# Patient Record
Sex: Male | Born: 1970 | Race: White | Hispanic: No | Marital: Single | Smoking: Never smoker
Health system: Southern US, Community
[De-identification: ages and names within clinical notes are randomized; demographics above are authoritative.]

## PROBLEM LIST (undated history)

## (undated) DIAGNOSIS — M1990 Unspecified osteoarthritis, unspecified site: Secondary | ICD-10-CM

## (undated) DIAGNOSIS — K920 Hematemesis: Secondary | ICD-10-CM

## (undated) DIAGNOSIS — N4821 Abscess of corpus cavernosum and penis: Secondary | ICD-10-CM

## (undated) DIAGNOSIS — E109 Type 1 diabetes mellitus without complications: Secondary | ICD-10-CM

## (undated) DIAGNOSIS — E039 Hypothyroidism, unspecified: Secondary | ICD-10-CM

## (undated) DIAGNOSIS — E875 Hyperkalemia: Secondary | ICD-10-CM

## (undated) DIAGNOSIS — Z992 Dependence on renal dialysis: Secondary | ICD-10-CM

## (undated) DIAGNOSIS — L723 Sebaceous cyst: Secondary | ICD-10-CM

## (undated) DIAGNOSIS — H269 Unspecified cataract: Secondary | ICD-10-CM

## (undated) DIAGNOSIS — E1140 Type 2 diabetes mellitus with diabetic neuropathy, unspecified: Secondary | ICD-10-CM

## (undated) DIAGNOSIS — I2510 Atherosclerotic heart disease of native coronary artery without angina pectoris: Secondary | ICD-10-CM

## (undated) DIAGNOSIS — I38 Endocarditis, valve unspecified: Secondary | ICD-10-CM

## (undated) DIAGNOSIS — F419 Anxiety disorder, unspecified: Secondary | ICD-10-CM

## (undated) DIAGNOSIS — D649 Anemia, unspecified: Secondary | ICD-10-CM

## (undated) DIAGNOSIS — I5030 Unspecified diastolic (congestive) heart failure: Secondary | ICD-10-CM

## (undated) DIAGNOSIS — J189 Pneumonia, unspecified organism: Secondary | ICD-10-CM

## (undated) DIAGNOSIS — I272 Pulmonary hypertension, unspecified: Secondary | ICD-10-CM

## (undated) DIAGNOSIS — R410 Disorientation, unspecified: Secondary | ICD-10-CM

## (undated) DIAGNOSIS — N186 End stage renal disease: Secondary | ICD-10-CM

## (undated) DIAGNOSIS — F329 Major depressive disorder, single episode, unspecified: Secondary | ICD-10-CM

## (undated) DIAGNOSIS — A419 Sepsis, unspecified organism: Secondary | ICD-10-CM

## (undated) DIAGNOSIS — I10 Essential (primary) hypertension: Secondary | ICD-10-CM

## (undated) DIAGNOSIS — I2729 Other secondary pulmonary hypertension: Secondary | ICD-10-CM

## (undated) DIAGNOSIS — I219 Acute myocardial infarction, unspecified: Secondary | ICD-10-CM

## (undated) DIAGNOSIS — M869 Osteomyelitis, unspecified: Secondary | ICD-10-CM

## (undated) DIAGNOSIS — I739 Peripheral vascular disease, unspecified: Secondary | ICD-10-CM

## (undated) DIAGNOSIS — J9610 Chronic respiratory failure, unspecified whether with hypoxia or hypercapnia: Secondary | ICD-10-CM

## (undated) DIAGNOSIS — F32A Depression, unspecified: Secondary | ICD-10-CM

## (undated) DIAGNOSIS — E785 Hyperlipidemia, unspecified: Secondary | ICD-10-CM

## (undated) DIAGNOSIS — K219 Gastro-esophageal reflux disease without esophagitis: Secondary | ICD-10-CM

## (undated) DIAGNOSIS — N360 Urethral fistula: Secondary | ICD-10-CM

## (undated) DIAGNOSIS — I252 Old myocardial infarction: Secondary | ICD-10-CM

## (undated) DIAGNOSIS — N492 Inflammatory disorders of scrotum: Secondary | ICD-10-CM

## (undated) DIAGNOSIS — Z8719 Personal history of other diseases of the digestive system: Secondary | ICD-10-CM

## (undated) HISTORY — DX: Other secondary pulmonary hypertension: I27.29

## (undated) HISTORY — PX: DIALYSIS FISTULA CREATION: SHX611

## (undated) HISTORY — DX: Abscess of corpus cavernosum and penis: N48.21

## (undated) HISTORY — DX: Sepsis, unspecified organism: A41.9

## (undated) HISTORY — DX: Old myocardial infarction: I25.2

## (undated) HISTORY — DX: Chronic respiratory failure, unspecified whether with hypoxia or hypercapnia: J96.10

## (undated) HISTORY — PX: CARDIAC CATHETERIZATION: SHX172

## (undated) HISTORY — PX: SKIN GRAFT: SHX250

## (undated) HISTORY — DX: Inflammatory disorders of scrotum: N49.2

## (undated) HISTORY — DX: Disorientation, unspecified: R41.0

## (undated) HISTORY — DX: Hematemesis: K92.0

## (undated) HISTORY — DX: Endocarditis, valve unspecified: I38

## (undated) HISTORY — DX: End stage renal disease: N18.6

## (undated) HISTORY — DX: Sebaceous cyst: L72.3

## (undated) HISTORY — DX: Dependence on renal dialysis: Z99.2

## (undated) HISTORY — DX: Pulmonary hypertension, unspecified: I27.20

## (undated) HISTORY — DX: Urethral fistula: N36.0

## (undated) HISTORY — PX: AMPUTATION: SHX166

## (undated) HISTORY — DX: Type 1 diabetes mellitus without complications: E10.9

## (undated) HISTORY — DX: Essential (primary) hypertension: I10

## (undated) HISTORY — DX: Atherosclerotic heart disease of native coronary artery without angina pectoris: I25.10

## (undated) HISTORY — DX: Unspecified diastolic (congestive) heart failure: I50.30

## (undated) HISTORY — PX: FOOT AMPUTATION: SHX951

## (undated) HISTORY — DX: Type 2 diabetes mellitus with diabetic neuropathy, unspecified: E11.40

## (undated) HISTORY — DX: Unspecified cataract: H26.9

## (undated) HISTORY — PX: ABSCESS DRAINAGE: SHX1119

---

## 1997-11-09 HISTORY — PX: EYE SURGERY: SHX253

## 2005-12-24 ENCOUNTER — Inpatient Hospital Stay: Payer: Self-pay | Admitting: Internal Medicine

## 2007-03-13 ENCOUNTER — Inpatient Hospital Stay: Payer: Self-pay | Admitting: Internal Medicine

## 2007-03-15 ENCOUNTER — Other Ambulatory Visit: Payer: Self-pay

## 2007-03-23 ENCOUNTER — Ambulatory Visit: Payer: Self-pay | Admitting: Specialist

## 2007-03-28 ENCOUNTER — Ambulatory Visit: Payer: Self-pay | Admitting: Internal Medicine

## 2007-04-03 ENCOUNTER — Ambulatory Visit: Payer: Self-pay | Admitting: Internal Medicine

## 2007-04-10 ENCOUNTER — Ambulatory Visit: Payer: Self-pay | Admitting: Internal Medicine

## 2007-04-19 ENCOUNTER — Ambulatory Visit: Payer: Self-pay | Admitting: Internal Medicine

## 2008-11-09 DIAGNOSIS — I219 Acute myocardial infarction, unspecified: Secondary | ICD-10-CM

## 2008-11-09 HISTORY — DX: Acute myocardial infarction, unspecified: I21.9

## 2008-11-25 ENCOUNTER — Inpatient Hospital Stay: Payer: Self-pay | Admitting: Internal Medicine

## 2008-12-01 ENCOUNTER — Inpatient Hospital Stay: Payer: Self-pay | Admitting: Specialist

## 2008-12-25 ENCOUNTER — Ambulatory Visit: Payer: Self-pay | Admitting: Family Medicine

## 2008-12-27 ENCOUNTER — Inpatient Hospital Stay: Payer: Self-pay | Admitting: Internal Medicine

## 2009-02-20 ENCOUNTER — Ambulatory Visit: Payer: Self-pay | Admitting: Orthopedic Surgery

## 2009-02-28 ENCOUNTER — Ambulatory Visit: Payer: Self-pay | Admitting: Vascular Surgery

## 2009-03-05 ENCOUNTER — Ambulatory Visit: Payer: Self-pay | Admitting: Vascular Surgery

## 2009-05-03 ENCOUNTER — Ambulatory Visit: Payer: Self-pay | Admitting: Vascular Surgery

## 2009-05-17 ENCOUNTER — Ambulatory Visit: Payer: Self-pay | Admitting: Vascular Surgery

## 2009-05-23 ENCOUNTER — Inpatient Hospital Stay: Payer: Self-pay | Admitting: Internal Medicine

## 2009-06-26 ENCOUNTER — Emergency Department: Payer: Self-pay | Admitting: Unknown Physician Specialty

## 2009-10-08 ENCOUNTER — Ambulatory Visit: Payer: Self-pay | Admitting: Vascular Surgery

## 2009-10-09 HISTORY — PX: CORONARY ARTERY BYPASS GRAFT: SHX141

## 2009-10-10 ENCOUNTER — Inpatient Hospital Stay: Payer: Self-pay | Admitting: Internal Medicine

## 2009-11-05 ENCOUNTER — Emergency Department: Payer: Self-pay | Admitting: Emergency Medicine

## 2010-07-10 ENCOUNTER — Ambulatory Visit: Payer: Self-pay | Admitting: Vascular Surgery

## 2010-08-25 ENCOUNTER — Inpatient Hospital Stay: Payer: Self-pay | Admitting: Internal Medicine

## 2010-08-25 ENCOUNTER — Ambulatory Visit: Payer: Self-pay | Admitting: Internal Medicine

## 2010-08-29 ENCOUNTER — Ambulatory Visit: Payer: Self-pay | Admitting: Vascular Surgery

## 2010-09-02 ENCOUNTER — Other Ambulatory Visit: Payer: Self-pay | Admitting: Podiatry

## 2010-09-05 ENCOUNTER — Ambulatory Visit: Payer: Self-pay | Admitting: Podiatry

## 2010-09-15 ENCOUNTER — Other Ambulatory Visit: Payer: Self-pay | Admitting: Podiatry

## 2010-09-24 ENCOUNTER — Ambulatory Visit: Payer: Self-pay | Admitting: Podiatry

## 2010-09-26 ENCOUNTER — Ambulatory Visit: Payer: Self-pay | Admitting: Podiatry

## 2010-09-29 LAB — PATHOLOGY REPORT

## 2010-10-07 ENCOUNTER — Ambulatory Visit: Payer: Self-pay | Admitting: Cardiovascular Disease

## 2010-11-26 ENCOUNTER — Ambulatory Visit: Payer: Self-pay | Admitting: Cardiovascular Disease

## 2011-01-26 ENCOUNTER — Telehealth: Payer: Self-pay | Admitting: Family Medicine

## 2011-01-26 ENCOUNTER — Encounter: Payer: Self-pay | Admitting: Family Medicine

## 2011-01-26 ENCOUNTER — Ambulatory Visit (INDEPENDENT_AMBULATORY_CARE_PROVIDER_SITE_OTHER): Payer: Medicare Other | Admitting: Family Medicine

## 2011-01-26 ENCOUNTER — Other Ambulatory Visit: Payer: Self-pay | Admitting: Family Medicine

## 2011-01-26 DIAGNOSIS — E785 Hyperlipidemia, unspecified: Secondary | ICD-10-CM

## 2011-01-26 DIAGNOSIS — N186 End stage renal disease: Secondary | ICD-10-CM

## 2011-01-26 DIAGNOSIS — E109 Type 1 diabetes mellitus without complications: Secondary | ICD-10-CM

## 2011-01-26 DIAGNOSIS — E119 Type 2 diabetes mellitus without complications: Secondary | ICD-10-CM

## 2011-01-26 DIAGNOSIS — I252 Old myocardial infarction: Secondary | ICD-10-CM | POA: Insufficient documentation

## 2011-01-26 DIAGNOSIS — E039 Hypothyroidism, unspecified: Secondary | ICD-10-CM | POA: Insufficient documentation

## 2011-01-26 HISTORY — DX: Old myocardial infarction: I25.2

## 2011-01-26 LAB — BASIC METABOLIC PANEL
CO2: 31 mEq/L (ref 19–32)
Chloride: 95 mEq/L — ABNORMAL LOW (ref 96–112)
Creatinine, Ser: 4.6 mg/dL (ref 0.4–1.5)
Sodium: 136 mEq/L (ref 135–145)

## 2011-01-26 LAB — LIPID PANEL
Total CHOL/HDL Ratio: 3
Triglycerides: 84 mg/dL (ref 0.0–149.0)

## 2011-01-26 LAB — HEMOGLOBIN A1C: Hgb A1c MFr Bld: 13.3 % — ABNORMAL HIGH (ref 4.6–6.5)

## 2011-01-27 ENCOUNTER — Other Ambulatory Visit: Payer: Self-pay | Admitting: Family Medicine

## 2011-01-27 DIAGNOSIS — E1122 Type 2 diabetes mellitus with diabetic chronic kidney disease: Secondary | ICD-10-CM

## 2011-02-05 NOTE — Progress Notes (Signed)
  Phone Note From Other Clinic   Caller: [facility] Call For: Dr [name] Summary of Call: Critical CRT, 4.6 Initial call taken by: Mills Koller,  January 26, 2011 4:45 PM

## 2011-02-05 NOTE — Assessment & Plan Note (Signed)
Summary: new pt to est CLE   Vital Signs:  Patient profile:   40 year old male Height:      75 inches Weight:      208.50 pounds BMI:     26.15 Temp:     98.3 degrees F oral Pulse rate:   72 / minute Pulse rhythm:   regular BP sitting:   140 / 90  (right arm) Cuff size:   regular  Vitals Entered By: Linde Gillis CMA Duncan Dull) (January 26, 2011 1:57 PM) CC: new patient, establish care   History of Present Illness: 40 yo here to establish care.  ESRD- secondary to DM, HTN.  On dialysis for past two years, LUE fistula.  [facility]. Was on transplant list, per pt, taken off because he had an MI requiring triple bypass in 10/2009.  h/o MI- s/p triple CABG, 10/2009 (Dr. Katrinka Blazing, [facility]).  Per patient, had a cath in October that was unremarkable.  Denies any CP currently.  Type I DM- very brittle.  Only takes Lantus 10 units two times a day.  a1c in past was 14.  Pt does not have a primary doctors, most meds managed through nephrology or during hospital admissions.  Reports passing out when he is on higher doses of lantus.  Does not have an endocrinologist.  diabetic peripheral neuropathy- severe, s/p multiple toe amputations.  Have very little feeling in his fingers or lower extremities.  HLD- on crestor 20 mg daily.  unsure when he had a last lipid panel.  Hypothyroidism- on synthroid 25 micrograms daily.  Denies any symptoms of hypo or hyperthyroidism.   Preventive Screening-Counseling & Management  Alcohol-Tobacco     Smoking Status: never      Drug Use:  no.    Current Medications (verified): 1)  Sensipar 60 Mg Tabs (Cinacalcet Hcl) .... Take One Tablet With Biggest Meal of The Day 2)  Lyrica 150 Mg Caps (Pregabalin) .... Take One Tablet Two Times A Day 3)  Omeprazole 40 Mg Cpdr (Omeprazole) .... Take One Tablet By Mouth Daily and As Needed 4)  Carvedilol 25 Mg Tabs (Carvedilol) .... Take One Tablet By Mouth Two Times A Day 5)  Furosemide 80 Mg Tabs (Furosemide) .... Take  One Tablet By Mouth Two Times A Day 6)  Crestor 20 Mg Tabs (Rosuvastatin Calcium) .... Take One Tablet By Mouth At Bedtime 7)  Levothroid 25 Mcg Tabs (Levothyroxine Sodium) .... Take One Tablet By Mouth Daily 8)  Isosorbide Mononitrate Cr 30 Mg Xr24h-Tab (Isosorbide Mononitrate) .... Take One Tablet By Mouth Daily 9)  Fish Oil 1000 Mg Caps (Omega-3 Fatty Acids) .... Take One Tablet By Mouth Daily 10)  Aspirin 325 Mg Tabs (Aspirin) .... Take One Tablet By Mouth Daily 11)  Lantus 100 Unit/ml Soln (Insulin Glargine) .... Inject 10 Units Two Times A Day  Allergies (verified): No Known Drug Allergies  Past History:  Family History: Last updated: 01/26/2011 Family History of CAD Male 1st degree relative <60 Family History Diabetes 1st degree relative Family History Hypertension  Social History: Last updated: 01/26/2011 Never Smoked Alcohol use-yes Drug use-no  Risk Factors: Smoking Status: never (01/26/2011)  Past Medical History: Diabetes mellitus, type I End Stage Renal Disease- on dialysis- LUE fistula Hypothyroidism Myocardial infarction, hx of  Past Surgical History: left upper air fistula CABG 10/2009 toe amputations  Family History: Family History of CAD Male 1st degree relative <60 Family History Diabetes 1st degree relative Family History Hypertension  Social History: Never Smoked Alcohol  use-yes Drug use-no Smoking Status:  never Drug Use:  no  Review of Systems      See HPI General:  Denies malaise. Eyes:  Denies blurring. ENT:  Denies difficulty swallowing. CV:  Denies chest pain or discomfort. Resp:  Denies shortness of breath. GI:  Denies abdominal pain. GU:  Denies dysuria. MS:  Complains of muscle weakness. Derm:  Denies rash. Neuro:  Denies headaches. Psych:  Denies anxiety and depression. Endo:  Denies cold intolerance and heat intolerance. Heme:  Denies abnormal bruising and bleeding.  Physical Exam  General:  alert.  NAD Head:   normocephalic and atraumatic.   Eyes:  vision grossly intact, pupils equal, pupils round, and pupils reactive to light.   Ears:  R ear normal and L ear normal.   Nose:  External nasal examination shows no deformity or inflammation. Nasal mucosa are pink and moist without lesions or exudates. Mouth:  teeth missing.   Neck:  No deformities, masses, or tenderness noted. Lungs:  Normal respiratory effort, chest expands symmetrically. Lungs are clear to auscultation, no crackles or wheezes. Heart:  Normal rate and regular rhythm. S1 and S2 normal without gallop, murmur, click, rub or other extra sounds. Abdomen:  Bowel sounds positive,abdomen soft and non-tender without masses, organomegaly or hernias noted. Msk:  LUE fistula Neurologic:  alert & oriented X3 and gait normal.   Skin:  Intact without suspicious lesions or rashes Psych:  Cognition and judgment appear intact. Alert and cooperative with normal attention span and concentration. No apparent delusions, illusions, hallucinations   Impression & Recommendations:  Problem # 1:  END STAGE RENAL DISEASE (ICD-585.6) Assessment Unchanged awaiting records, check BMET today. Orders: TLB-BMP (Basic Metabolic Panel-BMET) (80048-METABOL)  Problem # 2:  HYPERLIPIDEMIA (ICD-272.4) Assessment: Unchanged recheck fasting lipid panel today. His updated medication list for this problem includes:    Crestor 20 Mg Tabs (Rosuvastatin calcium) .[name]... Take one tablet by mouth at bedtime  Orders: Venipuncture (16109) TLB-Lipid Panel (80061-LIPID)  Problem # 3:  DIABETES MELLITUS, TYPE I (ICD-250.01) Assessment: Deteriorated poorly controlled.  check a1c but likely will require endocrinology referral.  Needs to get this under control to prepare him for kidney transplant. UTD pneumovax. His updated medication list for this problem includes:    Aspirin 325 Mg Tabs (Aspirin) .[name]... Take one tablet by mouth daily    Lantus 100 Unit/ml Soln (Insulin  glargine) ..... Inject 10 units two times a day  Problem # 4:  MYOCARDIAL INFARCTION, HX OF (ICD-412) Assessment: Unchanged refer to [facility] cardiology. His updated medication list for this problem includes:    Carvedilol 25 Mg Tabs (Carvedilol) .[name]... Take one tablet by mouth two times a day    Furosemide 80 Mg Tabs (Furosemide) .[name]... Take one tablet by mouth two times a day    Isosorbide Mononitrate Cr 30 Mg Xr24h-tab (Isosorbide mononitrate) .[name]... Take one tablet by mouth daily    Aspirin 325 Mg Tabs (Aspirin) .[name]... Take one tablet by mouth daily  Orders: Cardiology Referral (Cardiology)  Problem # 5:  HYPOTHYROIDISM (ICD-244.9) Assessment: Unchanged recheck TSH today. His updated medication list for this problem includes:    Levothroid 25 Mcg Tabs (Levothyroxine sodium) .[name]... Take one tablet by mouth daily  Orders: TLB-TSH (Thyroid Stimulating Hormone) (84443-TSH)  Complete Medication List: 1)  Sensipar 60 Mg Tabs (Cinacalcet hcl) .... Take one tablet with biggest meal of the day 2)  Lyrica 150 Mg Caps (Pregabalin) .... Take one tablet two times a day 3)  Omeprazole 40  Mg Cpdr (Omeprazole) .... Take one tablet by mouth daily and as needed 4)  Carvedilol 25 Mg Tabs (Carvedilol) .... Take one tablet by mouth two times a day 5)  Furosemide 80 Mg Tabs (Furosemide) .... Take one tablet by mouth two times a day 6)  Crestor 20 Mg Tabs (Rosuvastatin calcium) .... Take one tablet by mouth at bedtime 7)  Levothroid 25 Mcg Tabs (Levothyroxine sodium) .... Take one tablet by mouth daily 8)  Isosorbide Mononitrate Cr 30 Mg Xr24h-tab (Isosorbide mononitrate) .... Take one tablet by mouth daily 9)  Fish Oil 1000 Mg Caps (Omega-3 fatty acids) .... Take one tablet by mouth daily 10)  Aspirin 325 Mg Tabs (Aspirin) .... Take one tablet by mouth daily 11)  Lantus 100 Unit/ml Soln (Insulin glargine) .... Inject 10 units two times a day  Other Orders: TLB-A1C / Hgb A1C (Glycohemoglobin)  (83036-A1C)  Patient Instructions: 1)  Please stop by to see Shirlee Limerick on your way out.   Orders Added: 1)  Cardiology Referral [Cardiology] 2)  Venipuncture [52841] 3)  TLB-Lipid Panel [80061-LIPID] 4)  TLB-BMP (Basic Metabolic Panel-BMET) [80048-METABOL] 5)  TLB-TSH (Thyroid Stimulating Hormone) [84443-TSH] 6)  TLB-A1C / Hgb A1C (Glycohemoglobin) [83036-A1C] 7)  New Patient Level IV [32440]    Current Allergies (reviewed today): No known allergies   TD Result Date:  01/22/2009 TD Result:  historical

## 2011-02-06 ENCOUNTER — Other Ambulatory Visit: Payer: Self-pay | Admitting: Family Medicine

## 2011-02-10 ENCOUNTER — Other Ambulatory Visit: Payer: Self-pay | Admitting: *Deleted

## 2011-02-11 ENCOUNTER — Encounter: Payer: Self-pay | Admitting: Cardiovascular Disease

## 2011-02-11 ENCOUNTER — Ambulatory Visit (INDEPENDENT_AMBULATORY_CARE_PROVIDER_SITE_OTHER): Payer: Medicare Other | Admitting: Cardiovascular Disease

## 2011-02-11 DIAGNOSIS — I10 Essential (primary) hypertension: Secondary | ICD-10-CM

## 2011-02-11 DIAGNOSIS — Z951 Presence of aortocoronary bypass graft: Secondary | ICD-10-CM

## 2011-02-11 DIAGNOSIS — E109 Type 1 diabetes mellitus without complications: Secondary | ICD-10-CM

## 2011-02-11 DIAGNOSIS — N186 End stage renal disease: Secondary | ICD-10-CM

## 2011-02-11 DIAGNOSIS — E785 Hyperlipidemia, unspecified: Secondary | ICD-10-CM

## 2011-02-11 DIAGNOSIS — I252 Old myocardial infarction: Secondary | ICD-10-CM

## 2011-02-11 HISTORY — DX: Essential (primary) hypertension: I10

## 2011-02-11 MED ORDER — ISOSORBIDE MONONITRATE ER 30 MG PO TB24: 30.0000 mg | ORAL_TABLET | Freq: Two times a day (BID) | ORAL | Status: DC

## 2011-02-11 NOTE — Assessment & Plan Note (Signed)
Isosorbide was increased from 30 mg daily to b.i.d. For hypertension. He can take this on nondialysis days, with one tab on dialysis days. I've asked him to monitor his blood pressure at home and call us with his numbers for further medication titration.

## 2011-02-11 NOTE — Progress Notes (Signed)
Patient ID: Taylor [name], male    DOB: 01-10-1971, 40 y.o.   MRN: [id number]  HPI Comments: Taylor [name] is a pleasant Gentleman with history of coronary artery disease, bypass surgery in December 2010 at [facility], poorly controlled diabetes, hypertension, end-stage renal disease on dialysis on Tuesday, Thursday and Saturday who presents to establish care.  Overall he reports that he is doing well. He does have occasional shortness of breath and chest pain when he overexerts himself. He did have a cardiac catheterization at the beginning of the year that showed patent grafts. He does report an episode of hypotension and several of his medications were held though he is uncertain which blood pressure medications these were. He has had hypotension on dialysis days and recently Dr. Thedore Mins has increased his dry weight.  EKG today shows normal sinus rhythm with rate 89 beats per minute with no significant ST or T wave changes.  ECHO 08/2010 Ejection Fraction = >55%. The left ventricular wall motion is normal. The right ventricle is normal in size and function. There is mild tricuspid regurgitation.   Cardiac cath 11/2010: Proximal LAD: There was a 80 % stenosis. Distal LAD: There was a 100% stenosis. 1st diagonal: There was a 70 % stenosis. Proximal circumflex: There was a 70 % stenosis. 1st obtuse marginal: There was a 100 % stenosis. Proximal RCA: There was a 70 % stenosis. Mid RCA: There was a 100 % stenosis. Right PDA: There was a 70 % stenosis.  Graft to the distal LAD: The graft was a LIMA. Graft angiography showed no evidence of disease. Graft to the 1st obtuse marginal: The graft was a saphenous vein graft from the aorta. Graft angiography showed no evidence of disease. Graft to the LPDA: The graft was a saphenous vein graft from the aorta. Graft angiography showed no evidence of disease. Graft to the distal RCA: The graft was a saphenous vein graft from the aorta. Graft angiography showed no  evidence of disease.     Review of Systems  Constitutional: Negative.   HENT: Negative.   Eyes: Negative.   Respiratory: Positive for shortness of breath.   Cardiovascular: Positive for chest pain.  Gastrointestinal: Negative.   Musculoskeletal: Negative.   Skin: Negative.   Neurological: Negative.   Hematological: Negative.   Psychiatric/Behavioral: Negative.   All other systems reviewed and are negative.   BP 172/98  Pulse 89  Ht 6\' 3"  (1.905 m)  Wt 209 lb (94.802 kg)  BMI 26.12 kg/m2   Physical Exam  Nursing note and vitals reviewed. Constitutional: He is oriented to person, place, and time. He appears well-developed and well-nourished.  HENT:  Head: Normocephalic.  Nose: Nose normal.  Mouth/Throat: Oropharynx is clear and moist.  Eyes: Conjunctivae are normal. Pupils are equal, round, and reactive to light.  Neck: Normal range of motion. Neck supple. No JVD present.  Cardiovascular: Normal rate, regular rhythm, S1 normal, S2 normal and intact distal pulses.  Exam reveals no gallop and no friction rub.   Murmur heard.  Systolic murmur is present with a grade of 2/6  Pulmonary/Chest: Effort normal and breath sounds normal. No respiratory distress. He has no wheezes. He has no rales. He exhibits no tenderness.  Abdominal: Soft. Bowel sounds are normal. He exhibits no distension. There is no tenderness.  Musculoskeletal: Normal range of motion. He exhibits no edema and no tenderness.       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  Neurological: He is alert and oriented to person, place, and time. Coordination normal.       Numbness in his fingers and toes  Skin: Skin is warm and dry. No rash noted. No erythema.  Psychiatric: He has a normal mood and affect. His behavior is normal. Judgment and thought content normal.           Assessment and Plan

## 2011-02-11 NOTE — Assessment & Plan Note (Signed)
History of CABG in December 2010. Patent grafts by cardiac catheter in January 2012. We'll continue aggressive medical management. Needs better diabetes control. He is scheduled to see endocrine in [place].

## 2011-02-11 NOTE — Assessment & Plan Note (Signed)
He reports hypertension during dialysis and his dry weight has recently been increased. We will need to watch his blood pressure closely as it is very elevated today.

## 2011-02-11 NOTE — Patient Instructions (Signed)
INCREASE Imdur to twice daily (or you can take 2 tablets at the same time daily)  Your physician has requested that you regularly monitor and record your blood pressure readings at home. Please use the same machine at the same time of day to check your readings and record them.  Call our office in 1-2 weeks with BP results.  Your physician recommends that you schedule a follow-up appointment in: 3 months

## 2011-02-11 NOTE — Assessment & Plan Note (Signed)
Cholesterol is excellent his current medication regimen. No changes made.

## 2011-02-11 NOTE — Assessment & Plan Note (Signed)
We have stressed to him the importance of diet compliance and following up with the endocrine referral in [facility]

## 2011-02-16 ENCOUNTER — Other Ambulatory Visit: Payer: Self-pay | Admitting: *Deleted

## 2011-02-16 MED ORDER — CINACALCET HCL 60 MG PO TABS: 60.0000 mg | ORAL_TABLET | Freq: Every day | ORAL | Status: DC

## 2011-02-18 ENCOUNTER — Other Ambulatory Visit: Payer: Self-pay | Admitting: Podiatry

## 2011-02-27 ENCOUNTER — Encounter: Payer: Self-pay | Admitting: Cardiovascular Disease

## 2011-03-02 ENCOUNTER — Encounter: Payer: Self-pay | Admitting: Family Medicine

## 2011-03-02 ENCOUNTER — Encounter: Payer: Self-pay | Admitting: Cardiovascular Disease

## 2011-03-06 ENCOUNTER — Emergency Department: Payer: Self-pay | Admitting: Emergency Medicine

## 2011-03-06 ENCOUNTER — Other Ambulatory Visit: Payer: Self-pay | Admitting: *Deleted

## 2011-03-06 MED ORDER — PREGABALIN 150 MG PO CAPS: 150.0000 mg | ORAL_CAPSULE | Freq: Two times a day (BID) | ORAL | Status: DC

## 2011-03-06 MED ORDER — FUROSEMIDE 80 MG PO TABS: 80.0000 mg | ORAL_TABLET | Freq: Two times a day (BID) | ORAL | Status: DC

## 2011-03-06 NOTE — Telephone Encounter (Signed)
Rx Lyrica called to pharmacy.

## 2011-04-07 ENCOUNTER — Inpatient Hospital Stay: Payer: Self-pay | Admitting: Internal Medicine

## 2011-04-10 DIAGNOSIS — I369 Nonrheumatic tricuspid valve disorder, unspecified: Secondary | ICD-10-CM

## 2011-04-20 ENCOUNTER — Inpatient Hospital Stay
Admission: AD | Admit: 2011-04-20 | Discharge: 2011-05-11 | Disposition: A | Discharge status: Home Health | Payer: Self-pay | Source: Ambulatory Visit | Attending: Internal Medicine | Admitting: Internal Medicine

## 2011-04-20 ENCOUNTER — Ambulatory Visit (HOSPITAL_COMMUNITY)
Admission: RE | Admit: 2011-04-20 | Payer: Medicare Other | Source: Other Acute Inpatient Hospital | Admitting: Internal Medicine

## 2011-04-20 ENCOUNTER — Ambulatory Visit (HOSPITAL_COMMUNITY)
Admission: RE | Admit: 2011-04-20 | Discharge: 2011-04-20 | Disposition: A | Discharge status: Home / Self Care | Payer: Medicare Other | Source: Other Acute Inpatient Hospital | Attending: Internal Medicine | Admitting: Internal Medicine

## 2011-04-21 LAB — HEPATIC FUNCTION PANEL
ALT: <5 U/L (ref 0–53)
AST: 32 U/L (ref 0–37)
Alkaline Phosphatase: 385 U/L — ABNORMAL HIGH (ref 39–117)
Bilirubin, Direct: 0.2 mg/dL (ref 0.0–0.3)
Total Bilirubin: 0.3 mg/dL (ref 0.3–1.2)

## 2011-04-21 LAB — BASIC METABOLIC PANEL
BUN: 45 mg/dL — ABNORMAL HIGH (ref 6–23)
Creatinine, Ser: 6.4 mg/dL — ABNORMAL HIGH (ref 0.4–1.5)
GFR calc Af Amer: 12 mL/min — ABNORMAL LOW (ref >60)
GFR calc non Af Amer: 10 mL/min — ABNORMAL LOW (ref >60)

## 2011-04-21 LAB — DIFFERENTIAL
Basophils Relative: 0 % (ref 0–1)
Eosinophils Absolute: 0.4 10*3/uL (ref 0.0–0.7)
Neutrophils Relative %: 85 % — ABNORMAL HIGH (ref 43–77)

## 2011-04-21 LAB — CBC
MCH: 28.4 pg (ref 26.0–34.0)
Platelets: 349 10*3/uL (ref 150–400)
RBC: 2.99 MIL/uL — ABNORMAL LOW (ref 4.22–5.81)
RDW: 15.9 % — ABNORMAL HIGH (ref 11.5–15.5)
WBC: 14.6 10*3/uL — ABNORMAL HIGH (ref 4.0–10.5)

## 2011-04-21 LAB — HEPATITIS B SURFACE ANTIGEN: Hepatitis B Surface Ag: NEGATIVE

## 2011-04-22 ENCOUNTER — Other Ambulatory Visit (HOSPITAL_COMMUNITY): Payer: Medicare Other | Attending: Internal Medicine

## 2011-04-22 LAB — T3, FREE: T3, Free: 1.6 pg/mL — ABNORMAL LOW (ref 2.3–4.2)

## 2011-04-23 LAB — GRAM STAIN

## 2011-04-24 LAB — CBC
HCT: 26.2 % — ABNORMAL LOW (ref 39.0–52.0)
MCH: 28.3 pg (ref 26.0–34.0)
MCHC: 32.4 g/dL (ref 30.0–36.0)
RDW: 15.4 % (ref 11.5–15.5)

## 2011-04-24 LAB — RENAL FUNCTION PANEL
Albumin: 1.8 g/dL — ABNORMAL LOW (ref 3.5–5.2)
BUN: 49 mg/dL — ABNORMAL HIGH (ref 6–23)
Calcium: 8.8 mg/dL (ref 8.4–10.5)
Phosphorus: 6.2 mg/dL — ABNORMAL HIGH (ref 2.3–4.6)
Potassium: 5.2 mEq/L — ABNORMAL HIGH (ref 3.5–5.1)

## 2011-04-24 LAB — URINE CULTURE: Culture: NO GROWTH

## 2011-04-27 ENCOUNTER — Other Ambulatory Visit (HOSPITAL_COMMUNITY): Payer: Medicare Other | Attending: Internal Medicine

## 2011-04-27 LAB — CBC
HCT: 21.9 % — ABNORMAL LOW (ref 39.0–52.0)
Hemoglobin: 7.1 g/dL — ABNORMAL LOW (ref 13.0–17.0)
RBC: 2.52 MIL/uL — ABNORMAL LOW (ref 4.22–5.81)
WBC: 11.9 10*3/uL — ABNORMAL HIGH (ref 4.0–10.5)

## 2011-04-27 LAB — RENAL FUNCTION PANEL
BUN: 87 mg/dL — ABNORMAL HIGH (ref 6–23)
CO2: 24 mEq/L (ref 19–32)
Chloride: 85 mEq/L — ABNORMAL LOW (ref 96–112)
GFR calc Af Amer: 8 mL/min — ABNORMAL LOW (ref >60)
Glucose, Bld: 296 mg/dL — ABNORMAL HIGH (ref 70–99)
Potassium: 6.6 mEq/L (ref 3.5–5.1)
Sodium: 125 mEq/L — ABNORMAL LOW (ref 135–145)

## 2011-04-27 LAB — FERRITIN: Ferritin: 821 ng/mL — ABNORMAL HIGH (ref 22–322)

## 2011-04-27 LAB — DIFFERENTIAL
Basophils Absolute: 0.1 10*3/uL (ref 0.0–0.1)
Basophils Relative: 0 % (ref 0–1)
Lymphocytes Relative: 10 % — ABNORMAL LOW (ref 12–46)
Monocytes Absolute: 0.7 10*3/uL (ref 0.1–1.0)
Neutro Abs: 8.8 10*3/uL — ABNORMAL HIGH (ref 1.7–7.7)
Neutrophils Relative %: 74 % (ref 43–77)

## 2011-04-27 LAB — IRON AND TIBC
Iron: 18 ug/dL — ABNORMAL LOW (ref 42–135)
TIBC: 167 ug/dL — ABNORMAL LOW (ref 215–435)
UIBC: 149 ug/dL

## 2011-04-27 LAB — ABO/RH: ABO/RH(D): O POS

## 2011-04-28 LAB — HEMOGLOBIN AND HEMATOCRIT, BLOOD
HCT: 23.3 % — ABNORMAL LOW (ref 39.0–52.0)
Hemoglobin: 7.4 g/dL — ABNORMAL LOW (ref 13.0–17.0)

## 2011-04-30 LAB — CROSSMATCH
ABO/RH(D): O POS
Antibody Screen: NEGATIVE
Unit division: 0

## 2011-04-30 LAB — HEMOGLOBIN AND HEMATOCRIT, BLOOD: Hemoglobin: 9.4 g/dL — ABNORMAL LOW (ref 13.0–17.0)

## 2011-05-01 LAB — URINALYSIS, ROUTINE W REFLEX MICROSCOPIC
Bilirubin Urine: NEGATIVE
Glucose, UA: 100 mg/dL — AB
Ketones, ur: NEGATIVE mg/dL
Protein, ur: 100 mg/dL — AB
Urobilinogen, UA: 0.2 mg/dL (ref 0.0–1.0)

## 2011-05-01 LAB — RENAL FUNCTION PANEL
Albumin: 1.9 g/dL — ABNORMAL LOW (ref 3.5–5.2)
BUN: 68 mg/dL — ABNORMAL HIGH (ref 6–23)
CO2: 27 mEq/L (ref 19–32)
Chloride: 88 mEq/L — ABNORMAL LOW (ref 96–112)
Creatinine, Ser: 7.02 mg/dL — ABNORMAL HIGH (ref 0.50–1.35)
GFR calc non Af Amer: 9 mL/min — ABNORMAL LOW (ref >60)
Potassium: 5.6 mEq/L — ABNORMAL HIGH (ref 3.5–5.1)

## 2011-05-01 LAB — CBC
HCT: 28.2 % — ABNORMAL LOW (ref 39.0–52.0)
MCV: 89.8 fL (ref 78.0–100.0)
RBC: 3.14 MIL/uL — ABNORMAL LOW (ref 4.22–5.81)
RDW: 14.7 % (ref 11.5–15.5)
WBC: 9.6 10*3/uL (ref 4.0–10.5)

## 2011-05-01 LAB — URINE MICROSCOPIC-ADD ON

## 2011-05-02 LAB — URINE CULTURE: Culture  Setup Time: 201206220108

## 2011-05-04 LAB — RENAL FUNCTION PANEL
Albumin: 2.2 g/dL — ABNORMAL LOW (ref 3.5–5.2)
BUN: 84 mg/dL — ABNORMAL HIGH (ref 6–23)
Calcium: 9 mg/dL (ref 8.4–10.5)
Glucose, Bld: 209 mg/dL — ABNORMAL HIGH (ref 70–99)
Phosphorus: 4.8 mg/dL — ABNORMAL HIGH (ref 2.3–4.6)

## 2011-05-04 LAB — CBC
HCT: 28.3 % — ABNORMAL LOW (ref 39.0–52.0)
Hemoglobin: 9.2 g/dL — ABNORMAL LOW (ref 13.0–17.0)
MCH: 29.4 pg (ref 26.0–34.0)
MCHC: 32.5 g/dL (ref 30.0–36.0)
RDW: 15 % (ref 11.5–15.5)

## 2011-05-05 ENCOUNTER — Other Ambulatory Visit (HOSPITAL_COMMUNITY): Payer: Medicare Other

## 2011-05-06 LAB — CBC
HCT: 28.5 % — ABNORMAL LOW (ref 39.0–52.0)
Hemoglobin: 9.5 g/dL — ABNORMAL LOW (ref 13.0–17.0)
MCH: 30 pg (ref 26.0–34.0)
MCHC: 33.3 g/dL (ref 30.0–36.0)
MCV: 89.9 fL (ref 78.0–100.0)
RDW: 14.6 % (ref 11.5–15.5)

## 2011-05-06 LAB — RENAL FUNCTION PANEL
BUN: 62 mg/dL — ABNORMAL HIGH (ref 6–23)
Calcium: 9.6 mg/dL (ref 8.4–10.5)
Creatinine, Ser: 6.91 mg/dL — ABNORMAL HIGH (ref 0.50–1.35)
Glucose, Bld: 118 mg/dL — ABNORMAL HIGH (ref 70–99)
Phosphorus: 4.8 mg/dL — ABNORMAL HIGH (ref 2.3–4.6)

## 2011-05-08 LAB — RENAL FUNCTION PANEL
Albumin: 2.3 g/dL — ABNORMAL LOW (ref 3.5–5.2)
BUN: 49 mg/dL — ABNORMAL HIGH (ref 6–23)
Calcium: 9.2 mg/dL (ref 8.4–10.5)
Chloride: 89 mEq/L — ABNORMAL LOW (ref 96–112)
Creatinine, Ser: 6.76 mg/dL — ABNORMAL HIGH (ref 0.50–1.35)

## 2011-05-08 LAB — CBC
HCT: 27.6 % — ABNORMAL LOW (ref 39.0–52.0)
MCH: 29.7 pg (ref 26.0–34.0)
MCHC: 33 g/dL (ref 30.0–36.0)
MCV: 90.2 fL (ref 78.0–100.0)
RDW: 14.5 % (ref 11.5–15.5)
WBC: 8.1 10*3/uL (ref 4.0–10.5)

## 2011-05-08 LAB — HEPATITIS B SURFACE ANTIBODY,QUALITATIVE: Hep B S Ab: POSITIVE — AB

## 2011-05-11 LAB — BASIC METABOLIC PANEL
BUN: 66 mg/dL — ABNORMAL HIGH (ref 6–23)
Chloride: 87 mEq/L — ABNORMAL LOW (ref 96–112)
Glucose, Bld: 113 mg/dL — ABNORMAL HIGH (ref 70–99)
Potassium: 4.5 mEq/L (ref 3.5–5.1)

## 2011-05-11 LAB — CBC
HCT: 27.8 % — ABNORMAL LOW (ref 39.0–52.0)
Hemoglobin: 9.1 g/dL — ABNORMAL LOW (ref 13.0–17.0)
MCHC: 32.7 g/dL (ref 30.0–36.0)
WBC: 8 10*3/uL (ref 4.0–10.5)

## 2011-05-19 ENCOUNTER — Encounter: Payer: Self-pay | Admitting: Cardiothoracic Surgery

## 2011-05-19 ENCOUNTER — Encounter: Payer: Self-pay | Admitting: Nurse Practitioner

## 2011-05-20 ENCOUNTER — Ambulatory Visit: Payer: Medicare Other | Admitting: Family Medicine

## 2011-05-21 ENCOUNTER — Telehealth: Payer: Self-pay | Admitting: *Deleted

## 2011-05-21 ENCOUNTER — Encounter: Payer: Self-pay | Admitting: Family Medicine

## 2011-05-21 ENCOUNTER — Ambulatory Visit (INDEPENDENT_AMBULATORY_CARE_PROVIDER_SITE_OTHER): Payer: Medicare Other | Admitting: Family Medicine

## 2011-05-21 VITALS — BP 130/90 | HR 92 | Temp 98.2°F | Ht 75.0 in | Wt 197.8 lb

## 2011-05-21 DIAGNOSIS — F410 Panic disorder [episodic paroxysmal anxiety] without agoraphobia: Secondary | ICD-10-CM

## 2011-05-21 DIAGNOSIS — N186 End stage renal disease: Secondary | ICD-10-CM

## 2011-05-21 DIAGNOSIS — E1110 Type 2 diabetes mellitus with ketoacidosis without coma: Secondary | ICD-10-CM

## 2011-05-21 DIAGNOSIS — N492 Inflammatory disorders of scrotum: Secondary | ICD-10-CM

## 2011-05-21 DIAGNOSIS — N4821 Abscess of corpus cavernosum and penis: Secondary | ICD-10-CM | POA: Insufficient documentation

## 2011-05-21 DIAGNOSIS — N498 Inflammatory disorders of other specified male genital organs: Secondary | ICD-10-CM

## 2011-05-21 MED ORDER — ALPRAZOLAM 0.5 MG PO TABS: 0.5000 mg | ORAL_TABLET | Freq: Three times a day (TID) | ORAL | Status: AC | PRN

## 2011-05-21 MED ORDER — INSULIN DETEMIR 100 UNIT/ML ~~LOC~~ SOLN: 15.0000 [IU] | Freq: Every day | SUBCUTANEOUS | Status: DC

## 2011-05-21 MED ORDER — INSULIN GLARGINE 100 UNIT/ML ~~LOC~~ SOLN: 15.0000 [IU] | Freq: Every day | SUBCUTANEOUS | Status: DC

## 2011-05-21 NOTE — Progress Notes (Signed)
Patient ID: Taylor [name], male    DOB: May 03, 1971, 40 y.o.   MRN: [id number]  HPI Comments: Taylor [name] is a pleasant Gentleman with history of coronary artery disease, bypass surgery in December 2010 at [facility], poorly controlled diabetes, hypertension, end-stage renal disease on dialysis on Tuesday, Thursday and Saturday here for [facility] follow up.  Notes reviewed.  Taylor [name] was admitted to [facility] on 04/07/2011 due to DKA secondary to right scrotal cellulitis and epididymitis.  Dr. Lonna Cobb performed and I and D of the scrotal abscess and he was placed on IV abx and DKA protocal. Once medically stable, he was transferred to [facility] on 04/20/2011 for wound care and IV abx administration. He remained on IV ancef throughout stay until a few days before discharge.  He also did require one blood transfusion during stay at [facility].  He was discharged home with [facility] PT/OT on 05/11/2011.    Overall he reports that he is ok, very deconditioned. No longer on abx, going to the wound care center. Just had his packing changed this week.  No pain in his scrotal area.  Since discharge, having panic attacks. Gets very anxious when he thinks about his health. No SI or HI.  Also wants to talk about signing a DNR.  Taylor [name] is his HPOA and she understands that he wants a DNR. DM- on Lantus 15 units qhs.  CBGs running in 120s-130s.  Denies any episodes of hypoglycemia.  Getting home health care- PT/OT. Needs walker, wheelchair and shower chair due to severe deconditioning. Has lost 12 pounds since last office visit.  Wt Readings from Last 3 Encounters:  05/21/11 197 lb 12 oz (89.699 kg)  02/11/11 209 lb (94.802 kg)  01/26/11 208 lb 8 oz (94.575 kg)    ROS: Constitutional: Negative.   HENT: Negative.   Eyes: Negative.   Gastrointestinal: Negative.   Musculoskeletal: Negative.   Skin: Negative.   Neurological: Negative.   Hematological: Negative.   Psychiatric/Behavioral: Negative.   All other  systems reviewed and are negative.  Patient Active Problem List  Diagnoses  . HYPOTHYROIDISM  . DIABETES MELLITUS, TYPE I  . HYPERLIPIDEMIA  . MYOCARDIAL INFARCTION, HX OF  . END STAGE RENAL DISEASE  . S/P CABG (coronary artery bypass graft)  . HTN (hypertension)  . Scrotal abscess  . DKA (diabetic ketoacidoses)   Past Medical History  Diagnosis Date  . End stage renal disease on dialysis     LUE fistula  . Diabetes mellitus     TYPE I  . Diabetic neuropathy     sever, s/p multiple toe amputation  . Thyroid disease     hypothyroidism  . Coronary artery disease 10/2009    CABG; Hx. of MI   Past Surgical History  Procedure Date  . Dialysis fistula creation     left upper air fistula  . Amputation     toe  . Coronary artery bypass graft 10/2009    [facility] (Dr. Katrinka Blazing)   History  Substance Use Topics  . Smoking status: Never Smoker   . Smokeless tobacco: Not on file  . Alcohol Use: 0.5 oz/week    1 drink(s) per week   Family History  Problem Relation Age of Onset  . Heart disease Other   . Hypertension Other   . Hypertension Mother   . Heart disease Mother    Allergies  Allergen Reactions  . Augmentin   . Hydromorphone Hcl   . Rifampin    Current  Outpatient Prescriptions on File Prior to Visit  Medication Sig Dispense Refill  . aspirin 325 MG tablet Take 325 mg by mouth daily.        . carvedilol (COREG) 25 MG tablet Take 25 mg by mouth 2 (two) times daily with a meal.        . cinacalcet (SENSIPAR) 60 MG tablet Take 1 tablet (60 mg total) by mouth daily. With biggest meal  1 tablet  3  . furosemide (LASIX) 80 MG tablet Take 1 tablet (80 mg total) by mouth 2 (two) times daily.  60 tablet  3  . insulin glargine (LANTUS) 100 UNIT/ML injection Inject 10 Units into the skin 2 (two) times daily.        . isosorbide mononitrate (IMDUR) 30 MG 24 hr tablet Take 1 tablet (30 mg total) by mouth 2 (two) times daily.  60 tablet  6  . Omega-3 Fatty Acids (FISH OIL) 1000 MG  CAPS Take 1,000 mg by mouth 1 dose over 46 hours.        [name] omeprazole (PRILOSEC) 40 MG capsule Take 40 mg by mouth daily.        . pregabalin (LYRICA) 150 MG capsule Take 1 capsule (150 mg total) by mouth 2 (two) times daily.  60 capsule  3  . rosuvastatin (CRESTOR) 20 MG tablet Take 20 mg by mouth daily.         The PMH, PSH, Social History, Family History, Medications, and allergies have been reviewed in [facility], and have been updated if relevant.   Physical Exam  There were no vitals taken for this visit. BP 130/90  Pulse 92  Temp(Src) 98.2 F (36.8 C) (Oral)  Ht 6\' 3"  (1.905 m)  Wt 197 lb 12 oz (89.699 kg)  BMI 24.72 kg/m2   Constitutional: He is oriented to person, place, and time. He appears well-developed, appears weaker than usual. HENT:  Head: Normocephalic.  Nose: Nose normal.  Mouth/Throat: Oropharynx is clear and moist.  Eyes: Conjunctivae are normal. Pupils are equal, round, and reactive to light.  Neck: Normal range of motion. Neck supple. No JVD present.  Cardiovascular: Normal rate, regular rhythm, S1 normal, S2 normal and intact distal pulses.  Exam reveals no gallop and no friction rub.   Murmur heard.  Systolic murmur is present with a grade of 2/6  Pulmonary/Chest: Effort normal and breath sounds normal. No respiratory distress. He has no wheezes. He has no rales. He exhibits no tenderness.  Abdominal: Soft. Bowel sounds are normal. He exhibits no distension. There is no tenderness.  Musculoskeletal: Normal range of motion. He exhibits no edema and no tenderness.       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  Neurological: He is alert and oriented to person, place, and time. Coordination normal.     Psychiatric: He has a normal mood and affect. His behavior is normal. Judgment and thought content normal.           Assessment and Plan   1. Scrotal abscess  New. Followed by wound care.    2. DKA (diabetic  ketoacidoses)  Resolved. Remains very deconditioned.  Orders sent to home care agency for home health.  3. End stage renal disease   DNR signed and returned to pt. Copy made to scan into chart here. 4. Panic attacks   New.  Xanax 0.5 mg three times daily as needed.

## 2011-05-21 NOTE — Telephone Encounter (Signed)
Spoke with patient and he stated that he is not on Levemir.  He takes 15 units of Lantus daily.  I called [facility] and left a message on the pharmacy voicemail advising as instructed.

## 2011-05-21 NOTE — Telephone Encounter (Signed)
Please call pt to verify. I think he's on lantus.

## 2011-05-21 NOTE — Telephone Encounter (Signed)
Prior Berkley Harvey is needed for levemir, plan prefers lantus but according to chart pt is already using lantus.  Form is on your desk.

## 2011-05-27 ENCOUNTER — Telehealth: Payer: Self-pay | Admitting: *Deleted

## 2011-05-27 NOTE — Telephone Encounter (Signed)
Spoke with patient and advised results, per pt he's scared to drink a lot with his dialysis. I advised to call 911 if any changes.

## 2011-05-27 NOTE — Telephone Encounter (Signed)
I spoke with Taylor [name] again. His brother is with him now and she will take him the ER when she gets to their house.

## 2011-05-27 NOTE — Telephone Encounter (Signed)
Taylor [name] called back after talking with patient and patient is refusing to go to the ER. [name] is asking what should be done.

## 2011-05-27 NOTE — Telephone Encounter (Signed)
Have him take another 10 units of novalog insulin He needs to drink as much fluids as he possibly can!! Continue to check his sugars every hour and give 10 more units of rapid acting insulin until his sugars get under 300 Critical that he drink lots If change in level of alertness, call 911

## 2011-05-27 NOTE — Telephone Encounter (Signed)
Noted.  Thank you.  I left a message for Irving Burton as well.

## 2011-05-27 NOTE — Telephone Encounter (Signed)
[facility] calling stating that pt's blood sugar is not registering on his machine, pt checked this morning at 10:00am and it didn't register, he then gave himself 5 units. [facility] was there to check his wound care and she rechecked at 12:20 and it still didn't register. I asked Dr.Letvak and he stated that the pt needs to go to the ER now, pt is not feeling good, thirsty and tired. Per pt he doesn't want to go, I then called Dr.Aron on her cell and she stated the same, that the pt needs to go to the ER now. Dr.Aron suggested I call his friend Irving Burton who's more reasonable and I did and she understood and stated she would take him. I then call Katrice back and she stated she would tell the patient.

## 2011-05-29 ENCOUNTER — Telehealth: Payer: Self-pay | Admitting: *Deleted

## 2011-05-29 NOTE — Telephone Encounter (Signed)
Arline Asp saw patient this afternoon and she says that his blood sugar is still reading so high that it is not registering on his machine. She says that he asked her not to call, and says that it doesn't matter because he will not be going to the ER. She just wanted you to be aware.

## 2011-05-30 NOTE — Telephone Encounter (Signed)
Noted.  His girlfriend, Irving Burton, has contacted [facility].

## 2011-06-02 ENCOUNTER — Telehealth: Payer: Self-pay | Admitting: *Deleted

## 2011-06-02 NOTE — Telephone Encounter (Signed)
Ok to fax-

## 2011-06-02 NOTE — Telephone Encounter (Signed)
Last two OV notes and demographics faxed to [phone].  Requested that they fax over a blank order form.

## 2011-06-02 NOTE — Telephone Encounter (Signed)
Pt has been getting home health care from [facility] but he wants to change to [facility] for nursing care, OT, PT.  They are asking that we fax demographics, recent notes, orders to fax number [phone],  Preferably on a [facility] referral form, if we have one.

## 2011-06-10 ENCOUNTER — Encounter: Payer: Self-pay | Admitting: Nurse Practitioner

## 2011-06-10 ENCOUNTER — Encounter: Payer: Self-pay | Admitting: Cardiothoracic Surgery

## 2011-06-11 DIAGNOSIS — N454 Abscess of epididymis or testis: Secondary | ICD-10-CM

## 2011-06-11 DIAGNOSIS — I509 Heart failure, unspecified: Secondary | ICD-10-CM

## 2011-06-11 DIAGNOSIS — N498 Inflammatory disorders of other specified male genital organs: Secondary | ICD-10-CM

## 2011-06-11 DIAGNOSIS — N186 End stage renal disease: Secondary | ICD-10-CM

## 2011-06-11 DIAGNOSIS — I120 Hypertensive chronic kidney disease with stage 5 chronic kidney disease or end stage renal disease: Secondary | ICD-10-CM

## 2011-06-16 ENCOUNTER — Telehealth: Payer: Self-pay | Admitting: *Deleted

## 2011-06-16 NOTE — Telephone Encounter (Signed)
I am aware that he is refusing certain medications.  He has had a hospice consult so hopefully some of these medications can be discontinued.

## 2011-06-16 NOTE — Telephone Encounter (Signed)
Patient says his Imdur 30 mg is causing headaches.  Jasmine December reports that he is noncompliant.  He is supposed to take his Lasix 80 mg twice a day but only takes 40 mg twice a day.  He says the higher dose dries him out too much.  Will not take his Calcium Acetate, refuses HH Aide, refuses to monitor BS.

## 2011-06-17 NOTE — Telephone Encounter (Signed)
Left message on voicemail with Jasmine December advising as instructed.

## 2011-06-22 DIAGNOSIS — E1029 Type 1 diabetes mellitus with other diabetic kidney complication: Secondary | ICD-10-CM

## 2011-06-22 DIAGNOSIS — N498 Inflammatory disorders of other specified male genital organs: Secondary | ICD-10-CM

## 2011-06-22 DIAGNOSIS — I120 Hypertensive chronic kidney disease with stage 5 chronic kidney disease or end stage renal disease: Secondary | ICD-10-CM

## 2011-07-01 ENCOUNTER — Other Ambulatory Visit: Payer: Self-pay | Admitting: Family Medicine

## 2011-07-01 ENCOUNTER — Telehealth: Payer: Self-pay

## 2011-07-01 MED ORDER — LISINOPRIL 20 MG PO TABS: 20.0000 mg | ORAL_TABLET | Freq: Every day | ORAL | Status: DC

## 2011-07-01 NOTE — Telephone Encounter (Signed)
Refill for lisinopril sent to [facility] in [place].

## 2011-07-02 ENCOUNTER — Telehealth: Payer: Self-pay

## 2011-07-02 MED ORDER — LISINOPRIL 20 MG PO TABS: 20.0000 mg | ORAL_TABLET | Freq: Every day | ORAL | Status: DC

## 2011-07-02 NOTE — Telephone Encounter (Signed)
Requested refill for lisinopril

## 2011-07-08 ENCOUNTER — Other Ambulatory Visit: Payer: Self-pay | Admitting: *Deleted

## 2011-07-08 MED ORDER — PREGABALIN 150 MG PO CAPS
150.0000 mg | ORAL_CAPSULE | Freq: Two times a day (BID) | ORAL | Status: DC
Start: 2011-07-08 — End: 2012-01-18

## 2011-07-08 NOTE — Telephone Encounter (Signed)
Received a call from [place] at [facility] stating that patient will be out of Lyrica tomorrow and cannot go without it.  Patient stated that he has asked the pharmacy to contact us but there is no documentation in his chart that they sent a refill request.

## 2011-07-08 NOTE — Telephone Encounter (Signed)
Rx called to [facility]. 

## 2011-07-10 ENCOUNTER — Telehealth: Payer: Self-pay

## 2011-07-10 ENCOUNTER — Other Ambulatory Visit: Payer: Self-pay | Admitting: *Deleted

## 2011-07-10 MED ORDER — ROSUVASTATIN CALCIUM 20 MG PO TABS: 20.0000 mg | ORAL_TABLET | Freq: Every day | ORAL | Status: DC

## 2011-07-10 MED ORDER — OMEPRAZOLE 40 MG PO CPDR: 40.0000 mg | DELAYED_RELEASE_CAPSULE | Freq: Every day | ORAL | Status: DC

## 2011-07-10 NOTE — Telephone Encounter (Signed)
Refill requested for omeprazole 40 mg take one tablet daily as needed.

## 2011-07-11 ENCOUNTER — Encounter: Payer: Self-pay | Admitting: Nurse Practitioner

## 2011-07-11 ENCOUNTER — Encounter: Payer: Self-pay | Admitting: Cardiothoracic Surgery

## 2011-08-10 ENCOUNTER — Other Ambulatory Visit: Payer: Self-pay | Admitting: *Deleted

## 2011-08-10 MED ORDER — CARVEDILOL 25 MG PO TABS: 25.0000 mg | ORAL_TABLET | Freq: Two times a day (BID) | ORAL | Status: DC

## 2011-09-10 ENCOUNTER — Other Ambulatory Visit: Payer: Self-pay | Admitting: *Deleted

## 2011-09-10 MED ORDER — FUROSEMIDE 80 MG PO TABS: 80.0000 mg | ORAL_TABLET | Freq: Two times a day (BID) | ORAL | Status: DC

## 2011-11-23 ENCOUNTER — Encounter: Payer: Self-pay | Admitting: Family Medicine

## 2011-11-23 ENCOUNTER — Ambulatory Visit (INDEPENDENT_AMBULATORY_CARE_PROVIDER_SITE_OTHER): Payer: Medicare Other | Admitting: Family Medicine

## 2011-11-23 VITALS — BP 142/80 | HR 73 | Temp 97.9°F | Wt 198.5 lb

## 2011-11-23 DIAGNOSIS — L0291 Cutaneous abscess, unspecified: Secondary | ICD-10-CM

## 2011-11-23 DIAGNOSIS — L0390 Cellulitis, unspecified: Secondary | ICD-10-CM

## 2011-11-23 MED ORDER — BACITRACIN 500 UNIT/GM EX OINT: 1.0000 "application " | TOPICAL_OINTMENT | Freq: Two times a day (BID) | CUTANEOUS | Status: AC

## 2011-11-23 MED ORDER — SULFAMETHOXAZOLE-TRIMETHOPRIM 800-160 MG PO TABS: 1.0000 | ORAL_TABLET | Freq: Two times a day (BID) | ORAL | Status: AC

## 2011-11-23 NOTE — Progress Notes (Signed)
Patient ID: Taylor [name], male    DOB: 1971-09-18, 41 y.o.   MRN: [id number]  HPI Comments: Taylor [name] is a pleasant Gentleman with history of coronary artery disease, bypass surgery in December 2010 at [facility], poorly controlled diabetes, hypertension, end-stage renal disease on dialysis on Tuesday, Thursday and Saturday here abscesses.  H/o hospitalization for multiple abscesses in past.    Noticed growing "bump" on back of right neck several days ago. Painful, feels warm. Has not been draining.  No fevers , has had chills.  As I was leaving room, he mentioned he also has one on his left leg that he feels is improving.      ROS: See HPI No CP No nausea or vomiting   Patient Active Problem List  Diagnoses  . HYPOTHYROIDISM  . DIABETES MELLITUS, TYPE I  . HYPERLIPIDEMIA  . MYOCARDIAL INFARCTION, HX OF  . END STAGE RENAL DISEASE  . S/P CABG (coronary artery bypass graft)  . HTN (hypertension)  . Scrotal abscess  . DKA (diabetic ketoacidoses)  . Panic attacks  . Abscess and cellulitis   Past Medical History  Diagnosis Date  . End stage renal disease on dialysis     LUE fistula  . Diabetes mellitus     TYPE I  . Diabetic neuropathy     sever, s/p multiple toe amputation  . Thyroid disease     hypothyroidism  . Coronary artery disease 10/2009    CABG; Hx. of MI   Past Surgical History  Procedure Date  . Dialysis fistula creation     left upper air fistula  . Amputation     toe  . Coronary artery bypass graft 10/2009    [facility] (Dr. Katrinka Blazing)   History  Substance Use Topics  . Smoking status: Never Smoker   . Smokeless tobacco: Not on file  . Alcohol Use: 0.5 oz/week    1 drink(s) per week   Family History  Problem Relation Age of Onset  . Heart disease Other   . Hypertension Other   . Hypertension Mother   . Heart disease Mother    Allergies  Allergen Reactions  . Augmentin   . Hydromorphone Hcl   . Rifampin    Current Outpatient  Prescriptions on File Prior to Visit  Medication Sig Dispense Refill  . ALPRAZolam (XANAX) 0.5 MG tablet Take 1 tablet (0.5 mg total) by mouth 3 (three) times daily as needed for sleep or anxiety.  90 tablet  0  . aspirin 325 MG tablet Take 325 mg by mouth daily.        . carvedilol (COREG) 25 MG tablet Take 1 tablet (25 mg total) by mouth 2 (two) times daily with a meal.  180 tablet  3  . cinacalcet (SENSIPAR) 60 MG tablet Take 1 tablet (60 mg total) by mouth daily. With biggest meal  1 tablet  3  . furosemide (LASIX) 80 MG tablet Take 1 tablet (80 mg total) by mouth 2 (two) times daily.  60 tablet  6  . insulin aspart (NOVOLOG) 100 UNIT/ML injection Use as directed (sliding scale)       . insulin glargine (LANTUS) 100 UNIT/ML injection Inject 15 Units into the skin daily.  10 mL  11  . isosorbide mononitrate (IMDUR) 30 MG 24 hr tablet Take 1 tablet (30 mg total) by mouth 2 (two) times daily.  60 tablet  6  . levothyroxine (SYNTHROID, LEVOTHROID) 50 MCG tablet TAKE ONE TABLET BY MOUTH  DAILY  30 tablet  6  . Lidocaine HCl 2.5 % KIT Apply 1 application topically every 12 (twelve) hours as needed.        [name] lisinopril (PRINIVIL,ZESTRIL) 20 MG tablet Take 1 tablet (20 mg total) by mouth daily.  30 tablet  6  . Omega-3 Fatty Acids (FISH OIL) 1000 MG CAPS Take 1,000 mg by mouth 1 dose over 46 hours.        [name] omeprazole (PRILOSEC) 40 MG capsule Take 1 capsule (40 mg total) by mouth daily.  30 capsule  6  . pregabalin (LYRICA) 150 MG capsule Take 1 capsule (150 mg total) by mouth 2 (two) times daily.  60 capsule  3  . rosuvastatin (CRESTOR) 20 MG tablet Take 1 tablet (20 mg total) by mouth daily.  30 tablet  6  . Sevelamer Carbonate (RENVELA) 2.4 G PACK Take 1 each by mouth 3 (three) times daily with meals.         The PMH, PSH, Social History, Family History, Medications, and allergies have been reviewed in [facility], and have been updated if relevant.   Physical Exam  BP 142/80  Pulse 73  Temp(Src) 97.9  F (36.6 C) (Oral)  Wt 198 lb 8 oz (90.039 kg)   Constitutional: He is oriented to person, place, and time. He appears well-developed, appears weaker than usual. HENT:  Head: Normocephalic.  Nose: Nose normal.  Mouth/Throat: Oropharynx is clear and moist.  Eyes: Conjunctivae are normal. Pupils are equal, round, and reactive to light.  Neck: Normal range of motion. Neck supple. No JVD present.  Cardiovascular: Normal rate, regular rhythm, S1 normal, S2 normal and intact distal pulses.  Exam reveals no gallop and no friction rub.   Murmur heard.  Systolic murmur is present with a grade of 2/6  Pulmonary/Chest: Effort normal and breath sounds normal. No respiratory distress. He has no wheezes. He has no rales. He exhibits no tenderness.  Abdominal: Soft. Bowel sounds are normal. He exhibits no distension. There is no tenderness.  Musculoskeletal: Normal range of motion. He exhibits no edema and no tenderness.       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  Neurological: He is alert and oriented to person, place, and time. Coordination normal.     Psychiatric: He has a normal mood and affect. His behavior is normal. Judgment and thought content normal.       Skin: large, non fluctuant abscess on back of right neck extending into his scalp/hairline, tender to palp.    Assessment and Plan   1.  Abscess  New. Start Bactrim Ds- 1 tab twice daily x 10 days. Refer to surgery for I and D given his medical history. The patient indicates understanding of these issues and agrees with the plan.

## 2011-11-23 NOTE — Patient Instructions (Signed)
Good to see you. Please stop by to see [name] on your way out. 

## 2011-11-25 ENCOUNTER — Ambulatory Visit: Payer: Self-pay | Admitting: General Surgery

## 2012-01-04 ENCOUNTER — Encounter (HOSPITAL_COMMUNITY): Payer: Self-pay | Admitting: *Deleted

## 2012-01-04 ENCOUNTER — Inpatient Hospital Stay (HOSPITAL_COMMUNITY)
Admission: EM | Admit: 2012-01-04 | Discharge: 2012-01-10 | DRG: 637 | Disposition: A | Discharge status: Home Health | Payer: Medicare Other | Attending: Internal Medicine | Admitting: Internal Medicine

## 2012-01-04 ENCOUNTER — Ambulatory Visit (INDEPENDENT_AMBULATORY_CARE_PROVIDER_SITE_OTHER): Payer: Medicare Other | Admitting: Family Medicine

## 2012-01-04 ENCOUNTER — Encounter: Payer: Self-pay | Admitting: Family Medicine

## 2012-01-04 ENCOUNTER — Emergency Department (HOSPITAL_COMMUNITY): Payer: Medicare Other

## 2012-01-04 VITALS — BP 190/90 | HR 76 | Temp 97.9°F | Wt 198.0 lb

## 2012-01-04 DIAGNOSIS — Z79899 Other long term (current) drug therapy: Secondary | ICD-10-CM

## 2012-01-04 DIAGNOSIS — E1110 Type 2 diabetes mellitus with ketoacidosis without coma: Secondary | ICD-10-CM

## 2012-01-04 DIAGNOSIS — S98139A Complete traumatic amputation of one unspecified lesser toe, initial encounter: Secondary | ICD-10-CM

## 2012-01-04 DIAGNOSIS — L0291 Cutaneous abscess, unspecified: Secondary | ICD-10-CM

## 2012-01-04 DIAGNOSIS — N492 Inflammatory disorders of scrotum: Secondary | ICD-10-CM

## 2012-01-04 DIAGNOSIS — E11621 Type 2 diabetes mellitus with foot ulcer: Secondary | ICD-10-CM

## 2012-01-04 DIAGNOSIS — N186 End stage renal disease: Secondary | ICD-10-CM

## 2012-01-04 DIAGNOSIS — E1165 Type 2 diabetes mellitus with hyperglycemia: Secondary | ICD-10-CM

## 2012-01-04 DIAGNOSIS — Z992 Dependence on renal dialysis: Secondary | ICD-10-CM

## 2012-01-04 DIAGNOSIS — I252 Old myocardial infarction: Secondary | ICD-10-CM

## 2012-01-04 DIAGNOSIS — E785 Hyperlipidemia, unspecified: Secondary | ICD-10-CM

## 2012-01-04 DIAGNOSIS — F411 Generalized anxiety disorder: Secondary | ICD-10-CM | POA: Diagnosis present

## 2012-01-04 DIAGNOSIS — I739 Peripheral vascular disease, unspecified: Secondary | ICD-10-CM | POA: Diagnosis present

## 2012-01-04 DIAGNOSIS — Z7982 Long term (current) use of aspirin: Secondary | ICD-10-CM

## 2012-01-04 DIAGNOSIS — L97509 Non-pressure chronic ulcer of other part of unspecified foot with unspecified severity: Secondary | ICD-10-CM | POA: Diagnosis present

## 2012-01-04 DIAGNOSIS — E1100 Type 2 diabetes mellitus with hyperosmolarity without nonketotic hyperglycemic-hyperosmolar coma (NKHHC): Secondary | ICD-10-CM

## 2012-01-04 DIAGNOSIS — E871 Hypo-osmolality and hyponatremia: Secondary | ICD-10-CM

## 2012-01-04 DIAGNOSIS — Z9119 Patient's noncompliance with other medical treatment and regimen: Secondary | ICD-10-CM

## 2012-01-04 DIAGNOSIS — Z951 Presence of aortocoronary bypass graft: Secondary | ICD-10-CM

## 2012-01-04 DIAGNOSIS — E109 Type 1 diabetes mellitus without complications: Secondary | ICD-10-CM

## 2012-01-04 DIAGNOSIS — E1069 Type 1 diabetes mellitus with other specified complication: Secondary | ICD-10-CM | POA: Diagnosis present

## 2012-01-04 DIAGNOSIS — E1065 Type 1 diabetes mellitus with hyperglycemia: Principal | ICD-10-CM | POA: Diagnosis present

## 2012-01-04 DIAGNOSIS — K219 Gastro-esophageal reflux disease without esophagitis: Secondary | ICD-10-CM | POA: Diagnosis present

## 2012-01-04 DIAGNOSIS — K449 Diaphragmatic hernia without obstruction or gangrene: Secondary | ICD-10-CM | POA: Diagnosis present

## 2012-01-04 DIAGNOSIS — E119 Type 2 diabetes mellitus without complications: Secondary | ICD-10-CM

## 2012-01-04 DIAGNOSIS — F410 Panic disorder [episodic paroxysmal anxiety] without agoraphobia: Secondary | ICD-10-CM

## 2012-01-04 DIAGNOSIS — Z794 Long term (current) use of insulin: Secondary | ICD-10-CM

## 2012-01-04 DIAGNOSIS — Z91199 Patient's noncompliance with other medical treatment and regimen due to unspecified reason: Secondary | ICD-10-CM

## 2012-01-04 DIAGNOSIS — R739 Hyperglycemia, unspecified: Secondary | ICD-10-CM

## 2012-01-04 DIAGNOSIS — E039 Hypothyroidism, unspecified: Secondary | ICD-10-CM

## 2012-01-04 DIAGNOSIS — Z66 Do not resuscitate: Secondary | ICD-10-CM | POA: Diagnosis present

## 2012-01-04 DIAGNOSIS — E10621 Type 1 diabetes mellitus with foot ulcer: Secondary | ICD-10-CM

## 2012-01-04 DIAGNOSIS — D696 Thrombocytopenia, unspecified: Secondary | ICD-10-CM | POA: Diagnosis present

## 2012-01-04 DIAGNOSIS — I120 Hypertensive chronic kidney disease with stage 5 chronic kidney disease or end stage renal disease: Secondary | ICD-10-CM | POA: Diagnosis present

## 2012-01-04 DIAGNOSIS — I2510 Atherosclerotic heart disease of native coronary artery without angina pectoris: Secondary | ICD-10-CM | POA: Diagnosis present

## 2012-01-04 DIAGNOSIS — L0211 Cutaneous abscess of neck: Secondary | ICD-10-CM | POA: Diagnosis present

## 2012-01-04 DIAGNOSIS — E1049 Type 1 diabetes mellitus with other diabetic neurological complication: Secondary | ICD-10-CM | POA: Diagnosis present

## 2012-01-04 DIAGNOSIS — E1142 Type 2 diabetes mellitus with diabetic polyneuropathy: Secondary | ICD-10-CM

## 2012-01-04 DIAGNOSIS — I10 Essential (primary) hypertension: Secondary | ICD-10-CM

## 2012-01-04 DIAGNOSIS — N2581 Secondary hyperparathyroidism of renal origin: Secondary | ICD-10-CM | POA: Diagnosis present

## 2012-01-04 DIAGNOSIS — L0390 Cellulitis, unspecified: Secondary | ICD-10-CM

## 2012-01-04 DIAGNOSIS — E1029 Type 1 diabetes mellitus with other diabetic kidney complication: Secondary | ICD-10-CM | POA: Diagnosis present

## 2012-01-04 DIAGNOSIS — D638 Anemia in other chronic diseases classified elsewhere: Secondary | ICD-10-CM | POA: Diagnosis present

## 2012-01-04 DIAGNOSIS — L723 Sebaceous cyst: Secondary | ICD-10-CM | POA: Diagnosis present

## 2012-01-04 HISTORY — DX: Acute myocardial infarction, unspecified: I21.9

## 2012-01-04 HISTORY — DX: Pneumonia, unspecified organism: J18.9

## 2012-01-04 HISTORY — DX: Hypothyroidism, unspecified: E03.9

## 2012-01-04 HISTORY — DX: Hyperlipidemia, unspecified: E78.5

## 2012-01-04 HISTORY — DX: Personal history of other diseases of the digestive system: Z87.19

## 2012-01-04 HISTORY — DX: Anxiety disorder, unspecified: F41.9

## 2012-01-04 HISTORY — DX: Gastro-esophageal reflux disease without esophagitis: K21.9

## 2012-01-04 HISTORY — DX: Essential (primary) hypertension: I10

## 2012-01-04 LAB — COMPREHENSIVE METABOLIC PANEL
ALT: 25 U/L (ref 0–53)
Alkaline Phosphatase: 234 U/L — ABNORMAL HIGH (ref 39–117)
BUN: 54 mg/dL — ABNORMAL HIGH (ref 6–23)
CO2: 22 mEq/L (ref 19–32)
Calcium: 9.9 mg/dL (ref 8.4–10.5)
GFR calc Af Amer: 17 mL/min — ABNORMAL LOW (ref >90)
GFR calc non Af Amer: 15 mL/min — ABNORMAL LOW (ref >90)
Glucose, Bld: 844 mg/dL (ref 70–99)
Potassium: 4.9 mEq/L (ref 3.5–5.1)
Total Protein: 8 g/dL (ref 6.0–8.3)

## 2012-01-04 LAB — URINE MICROSCOPIC-ADD ON

## 2012-01-04 LAB — CBC
HCT: 34 % — ABNORMAL LOW (ref 39.0–52.0)
MCHC: 32.6 g/dL (ref 30.0–36.0)
RDW: 13.8 % (ref 11.5–15.5)
WBC: 8.6 10*3/uL (ref 4.0–10.5)

## 2012-01-04 LAB — URINALYSIS, ROUTINE W REFLEX MICROSCOPIC
Bilirubin Urine: NEGATIVE
Leukocytes, UA: NEGATIVE
Nitrite: NEGATIVE
Specific Gravity, Urine: 1.016 (ref 1.005–1.030)
Urobilinogen, UA: 0.2 mg/dL (ref 0.0–1.0)
pH: 7 (ref 5.0–8.0)

## 2012-01-04 LAB — BASIC METABOLIC PANEL
Calcium: 9.9 mg/dL (ref 8.4–10.5)
GFR calc Af Amer: 16 mL/min — ABNORMAL LOW (ref >90)
GFR calc non Af Amer: 14 mL/min — ABNORMAL LOW (ref >90)
Glucose, Bld: 405 mg/dL — ABNORMAL HIGH (ref 70–99)
Sodium: 127 mEq/L — ABNORMAL LOW (ref 135–145)

## 2012-01-04 LAB — POCT CBG (FASTING - GLUCOSE)-MANUAL ENTRY: Glucose Fasting, POC: HIGH mg/dL (ref 70–99)

## 2012-01-04 LAB — GLUCOSE, CAPILLARY: Glucose-Capillary: 577 mg/dL (ref 70–99)

## 2012-01-04 MED ORDER — ALBUTEROL SULFATE (5 MG/ML) 0.5% IN NEBU: 2.5000 mg | INHALATION_SOLUTION | RESPIRATORY_TRACT | Status: DC | PRN

## 2012-01-04 MED ORDER — ACETAMINOPHEN 650 MG RE SUPP: 650.0000 mg | Freq: Four times a day (QID) | RECTAL | Status: DC | PRN

## 2012-01-04 MED ORDER — SODIUM CHLORIDE 0.9 % IV SOLN
INTRAVENOUS | Status: DC
  Administered 2012-01-04: 23:00:00 via INTRAVENOUS

## 2012-01-04 MED ORDER — INSULIN REGULAR BOLUS VIA INFUSION
0.0000 [IU] | Freq: Three times a day (TID) | INTRAVENOUS | Status: DC
  Filled 2012-01-04: qty 10

## 2012-01-04 MED ORDER — HEPARIN SODIUM (PORCINE) 5000 UNIT/ML IJ SOLN
5000.0000 [IU] | Freq: Three times a day (TID) | INTRAMUSCULAR | Status: DC
  Administered 2012-01-05 – 2012-01-06 (×3): 5000 [IU] via SUBCUTANEOUS
  Filled 2012-01-04 (×21): qty 1

## 2012-01-04 MED ORDER — ISOSORBIDE MONONITRATE ER 30 MG PO TB24
30.0000 mg | ORAL_TABLET | Freq: Two times a day (BID) | ORAL | Status: DC
  Administered 2012-01-04 – 2012-01-10 (×12): 30 mg via ORAL
  Filled 2012-01-04 (×13): qty 1

## 2012-01-04 MED ORDER — LISINOPRIL 20 MG PO TABS
20.0000 mg | ORAL_TABLET | Freq: Every day | ORAL | Status: DC
  Administered 2012-01-05 – 2012-01-10 (×6): 20 mg via ORAL
  Filled 2012-01-04 (×6): qty 1

## 2012-01-04 MED ORDER — ALPRAZOLAM 0.5 MG PO TABS: 0.5000 mg | ORAL_TABLET | Freq: Three times a day (TID) | ORAL | Status: DC | PRN

## 2012-01-04 MED ORDER — MUPIROCIN 2 % EX OINT
1.0000 "application " | TOPICAL_OINTMENT | Freq: Every day | CUTANEOUS | Status: DC
  Filled 2012-01-04: qty 22

## 2012-01-04 MED ORDER — DEXTROSE 50 % IV SOLN: 25.0000 mL | INTRAVENOUS | Status: DC | PRN

## 2012-01-04 MED ORDER — PREGABALIN 50 MG PO CAPS
150.0000 mg | ORAL_CAPSULE | Freq: Two times a day (BID) | ORAL | Status: DC
  Administered 2012-01-04 – 2012-01-10 (×12): 150 mg via ORAL
  Filled 2012-01-04: qty 1
  Filled 2012-01-04: qty 3
  Filled 2012-01-04: qty 1
  Filled 2012-01-04: qty 3
  Filled 2012-01-04: qty 1
  Filled 2012-01-04 (×4): qty 3
  Filled 2012-01-04: qty 6
  Filled 2012-01-04: qty 1
  Filled 2012-01-04: qty 3

## 2012-01-04 MED ORDER — PANTOPRAZOLE SODIUM 40 MG PO TBEC
80.0000 mg | DELAYED_RELEASE_TABLET | Freq: Every day | ORAL | Status: DC
  Administered 2012-01-04 – 2012-01-09 (×6): 80 mg via ORAL
  Filled 2012-01-04: qty 2
  Filled 2012-01-04 (×2): qty 1
  Filled 2012-01-04 (×4): qty 2

## 2012-01-04 MED ORDER — ASPIRIN 325 MG PO TABS
325.0000 mg | ORAL_TABLET | Freq: Every day | ORAL | Status: DC
  Administered 2012-01-05 – 2012-01-10 (×6): 325 mg via ORAL
  Filled 2012-01-04 (×6): qty 1

## 2012-01-04 MED ORDER — ATORVASTATIN CALCIUM 40 MG PO TABS
40.0000 mg | ORAL_TABLET | Freq: Every day | ORAL | Status: DC
  Administered 2012-01-05 – 2012-01-09 (×5): 40 mg via ORAL
  Filled 2012-01-04 (×6): qty 1

## 2012-01-04 MED ORDER — SODIUM CHLORIDE 0.9 % IV SOLN: INTRAVENOUS | Status: DC

## 2012-01-04 MED ORDER — INSULIN GLARGINE 100 UNIT/ML ~~LOC~~ SOLN: 30.0000 [IU] | Freq: Every day | SUBCUTANEOUS | Status: DC

## 2012-01-04 MED ORDER — ONDANSETRON HCL 4 MG/2ML IJ SOLN: 4.0000 mg | Freq: Four times a day (QID) | INTRAMUSCULAR | Status: DC | PRN

## 2012-01-04 MED ORDER — OMEGA-3-ACID ETHYL ESTERS 1 G PO CAPS
1.0000 g | ORAL_CAPSULE | Freq: Every day | ORAL | Status: DC
  Administered 2012-01-05 – 2012-01-10 (×6): 1 g via ORAL
  Filled 2012-01-04 (×6): qty 1

## 2012-01-04 MED ORDER — CINACALCET HCL 30 MG PO TABS
60.0000 mg | ORAL_TABLET | Freq: Every day | ORAL | Status: DC
  Administered 2012-01-05 – 2012-01-10 (×6): 60 mg via ORAL
  Filled 2012-01-04 (×7): qty 2

## 2012-01-04 MED ORDER — DEXTROSE-NACL 5-0.45 % IV SOLN
INTRAVENOUS | Status: DC
  Administered 2012-01-04: 23:00:00 via INTRAVENOUS

## 2012-01-04 MED ORDER — LEVOTHYROXINE SODIUM 50 MCG PO TABS
50.0000 ug | ORAL_TABLET | Freq: Every day | ORAL | Status: DC
  Administered 2012-01-05 – 2012-01-10 (×6): 50 ug via ORAL
  Filled 2012-01-04 (×7): qty 1

## 2012-01-04 MED ORDER — INSULIN REGULAR HUMAN 100 UNIT/ML IJ SOLN
INTRAMUSCULAR | Status: DC
  Administered 2012-01-04: 5.2 [IU]/h via INTRAVENOUS
  Administered 2012-01-04: 1.9 [IU]/h via INTRAVENOUS
  Filled 2012-01-04: qty 1

## 2012-01-04 MED ORDER — INSULIN ASPART 100 UNIT/ML ~~LOC~~ SOLN
10.0000 [IU] | Freq: Once | SUBCUTANEOUS | Status: AC
  Administered 2012-01-04: 10 [IU] via INTRAVENOUS
  Filled 2012-01-04: qty 1

## 2012-01-04 MED ORDER — ACETAMINOPHEN 325 MG PO TABS: 650.0000 mg | ORAL_TABLET | Freq: Four times a day (QID) | ORAL | Status: DC | PRN

## 2012-01-04 MED ORDER — CARVEDILOL 25 MG PO TABS
25.0000 mg | ORAL_TABLET | Freq: Two times a day (BID) | ORAL | Status: DC
  Administered 2012-01-05 – 2012-01-10 (×11): 25 mg via ORAL
  Filled 2012-01-04 (×13): qty 1

## 2012-01-04 MED ORDER — ONDANSETRON HCL 4 MG PO TABS: 4.0000 mg | ORAL_TABLET | Freq: Four times a day (QID) | ORAL | Status: DC | PRN

## 2012-01-04 MED ORDER — SODIUM CHLORIDE 0.9 % IJ SOLN
3.0000 mL | Freq: Two times a day (BID) | INTRAMUSCULAR | Status: DC
  Administered 2012-01-04 – 2012-01-09 (×10): 3 mL via INTRAVENOUS

## 2012-01-04 NOTE — ED Notes (Signed)
Patient is a diabetic and a dialysis patient,  Last treatment was performed Saturday. Patient states his sugar has been reading high at home and he is concerned that he may be in DKA

## 2012-01-04 NOTE — ED Notes (Signed)
Checked pt's sugar. Too high to read on machine. RN notified.

## 2012-01-04 NOTE — ED Notes (Signed)
Unable to start IV times 2 , Iv team called to start IV pt made aware

## 2012-01-04 NOTE — Progress Notes (Signed)
Patient ID: Taylor [name], male    DOB: Feb 05, 1971, 41 y.o.   MRN: [id number]  HPI Comments: Taylor [name] is a pleasant Gentleman with history of coronary artery disease, bypass surgery in December 2010 at [facility], poorly controlled diabetes, hypertension, end-stage renal disease on dialysis on Tuesday, Thursday and Saturday here for elevated sugars.  DM- on Lantus 15 units qhs.  CBGs running in 120s-130s.  States he cannot tolerate short acting insulins- very brittle diabetic with h/o severe hypoglycemia. Lab Results  Component Value Date   HGBA1C 13.3* 01/26/2011   Was at dialysis this week and CBG was in 700s. Vomited once this morning. Feels like he did when he had DKA. Very fatigued. Afebrile.  CBG in office- cannot read due to elevated CBG.  Patient Active Problem List  Diagnoses  . HYPOTHYROIDISM  . DIABETES MELLITUS, TYPE I  . HYPERLIPIDEMIA  . MYOCARDIAL INFARCTION, HX OF  . END STAGE RENAL DISEASE  . S/P CABG (coronary artery bypass graft)  . HTN (hypertension)  . Scrotal abscess  . DKA (diabetic ketoacidoses)  . Panic attacks  . Abscess and cellulitis   Past Medical History  Diagnosis Date  . End stage renal disease on dialysis     LUE fistula  . Diabetes mellitus     TYPE I  . Diabetic neuropathy     sever, s/p multiple toe amputation  . Thyroid disease     hypothyroidism  . Coronary artery disease 10/2009    CABG; Hx. of MI   Past Surgical History  Procedure Date  . Dialysis fistula creation     left upper air fistula  . Amputation     toe  . Coronary artery bypass graft 10/2009    [facility] (Dr. Katrinka Blazing)   History  Substance Use Topics  . Smoking status: Never Smoker   . Smokeless tobacco: Not on file  . Alcohol Use: 0.5 oz/week    1 drink(s) per week   Family History  Problem Relation Age of Onset  . Heart disease Other   . Hypertension Other   . Hypertension Mother   . Heart disease Mother    Allergies  Allergen Reactions  .  Augmentin   . Hydromorphone Hcl   . Rifampin    Current Outpatient Prescriptions on File Prior to Visit  Medication Sig Dispense Refill  . ALPRAZolam (XANAX) 0.5 MG tablet Take 1 tablet (0.5 mg total) by mouth 3 (three) times daily as needed for sleep or anxiety.  90 tablet  0  . aspirin 325 MG tablet Take 325 mg by mouth daily.        . carvedilol (COREG) 25 MG tablet Take 1 tablet (25 mg total) by mouth 2 (two) times daily with a meal.  180 tablet  3  . cinacalcet (SENSIPAR) 60 MG tablet Take 1 tablet (60 mg total) by mouth daily. With biggest meal  1 tablet  3  . furosemide (LASIX) 80 MG tablet Take 1 tablet (80 mg total) by mouth 2 (two) times daily.  60 tablet  6  . insulin aspart (NOVOLOG) 100 UNIT/ML injection Use as directed (sliding scale)       . insulin glargine (LANTUS) 100 UNIT/ML injection Inject 15 Units into the skin daily.  10 mL  11  . isosorbide mononitrate (IMDUR) 30 MG 24 hr tablet Take 1 tablet (30 mg total) by mouth 2 (two) times daily.  60 tablet  6  . levothyroxine (SYNTHROID, LEVOTHROID) 50 MCG tablet  TAKE ONE TABLET BY MOUTH DAILY  30 tablet  6  . Lidocaine HCl 2.5 % KIT Apply 1 application topically every 12 (twelve) hours as needed.        [name] lisinopril (PRINIVIL,ZESTRIL) 20 MG tablet Take 1 tablet (20 mg total) by mouth daily.  30 tablet  6  . Omega-3 Fatty Acids (FISH OIL) 1000 MG CAPS Take 1,000 mg by mouth 1 dose over 46 hours.        [name] omeprazole (PRILOSEC) 40 MG capsule Take 1 capsule (40 mg total) by mouth daily.  30 capsule  6  . pregabalin (LYRICA) 150 MG capsule Take 1 capsule (150 mg total) by mouth 2 (two) times daily.  60 capsule  3  . rosuvastatin (CRESTOR) 20 MG tablet Take 1 tablet (20 mg total) by mouth daily.  30 tablet  6  . Sevelamer Carbonate (RENVELA) 2.4 G PACK Take 1 each by mouth 3 (three) times daily with meals.         The PMH, PSH, Social History, Family History, Medications, and allergies have been reviewed in [facility], and have been updated  if relevant.   ROS: See HPI  Patient Active Problem List  Diagnoses  . HYPOTHYROIDISM  . DIABETES MELLITUS, TYPE I  . HYPERLIPIDEMIA  . MYOCARDIAL INFARCTION, HX OF  . END STAGE RENAL DISEASE  . S/P CABG (coronary artery bypass graft)  . HTN (hypertension)  . Scrotal abscess  . DKA (diabetic ketoacidoses)  . Panic attacks  . Abscess and cellulitis   Past Medical History  Diagnosis Date  . End stage renal disease on dialysis     LUE fistula  . Diabetes mellitus     TYPE I  . Diabetic neuropathy     sever, s/p multiple toe amputation  . Thyroid disease     hypothyroidism  . Coronary artery disease 10/2009    CABG; Hx. of MI   Past Surgical History  Procedure Date  . Dialysis fistula creation     left upper air fistula  . Amputation     toe  . Coronary artery bypass graft 10/2009    [facility] (Dr. Katrinka Blazing)   History  Substance Use Topics  . Smoking status: Never Smoker   . Smokeless tobacco: Not on file  . Alcohol Use: 0.5 oz/week    1 drink(s) per week   Family History  Problem Relation Age of Onset  . Heart disease Other   . Hypertension Other   . Hypertension Mother   . Heart disease Mother    Allergies  Allergen Reactions  . Augmentin   . Hydromorphone Hcl   . Rifampin    Current Outpatient Prescriptions on File Prior to Visit  Medication Sig Dispense Refill  . ALPRAZolam (XANAX) 0.5 MG tablet Take 1 tablet (0.5 mg total) by mouth 3 (three) times daily as needed for sleep or anxiety.  90 tablet  0  . aspirin 325 MG tablet Take 325 mg by mouth daily.        . carvedilol (COREG) 25 MG tablet Take 1 tablet (25 mg total) by mouth 2 (two) times daily with a meal.  180 tablet  3  . cinacalcet (SENSIPAR) 60 MG tablet Take 1 tablet (60 mg total) by mouth daily. With biggest meal  1 tablet  3  . furosemide (LASIX) 80 MG tablet Take 1 tablet (80 mg total) by mouth 2 (two) times daily.  60 tablet  6  . insulin aspart (NOVOLOG) 100  UNIT/ML injection Use as directed  (sliding scale)       . insulin glargine (LANTUS) 100 UNIT/ML injection Inject 15 Units into the skin daily.  10 mL  11  . isosorbide mononitrate (IMDUR) 30 MG 24 hr tablet Take 1 tablet (30 mg total) by mouth 2 (two) times daily.  60 tablet  6  . levothyroxine (SYNTHROID, LEVOTHROID) 50 MCG tablet TAKE ONE TABLET BY MOUTH DAILY  30 tablet  6  . Lidocaine HCl 2.5 % KIT Apply 1 application topically every 12 (twelve) hours as needed.        [name] lisinopril (PRINIVIL,ZESTRIL) 20 MG tablet Take 1 tablet (20 mg total) by mouth daily.  30 tablet  6  . Omega-3 Fatty Acids (FISH OIL) 1000 MG CAPS Take 1,000 mg by mouth 1 dose over 46 hours.        [name] omeprazole (PRILOSEC) 40 MG capsule Take 1 capsule (40 mg total) by mouth daily.  30 capsule  6  . pregabalin (LYRICA) 150 MG capsule Take 1 capsule (150 mg total) by mouth 2 (two) times daily.  60 capsule  3  . rosuvastatin (CRESTOR) 20 MG tablet Take 1 tablet (20 mg total) by mouth daily.  30 tablet  6  . Sevelamer Carbonate (RENVELA) 2.4 G PACK Take 1 each by mouth 3 (three) times daily with meals.         The PMH, PSH, Social History, Family History, Medications, and allergies have been reviewed in [facility], and have been updated if relevant.   Physical Exam  BP 190/90  Pulse 76  Temp(Src) 97.9 F (36.6 C) (Oral)  Wt 198 lb (89.812 kg)  Constitutional: He is oriented to person, place, and time. He appears well-developed, appears weaker than usual, pale and HYPERTENSIVE. HENT:  Head: Normocephalic.  Nose: Nose normal.  Cardiovascular: Normal rate, regular rhythm, S1 normal, S2 normal and intact distal pulses.  Exam reveals no gallop and no friction rub.   Murmur heard.  Systolic murmur is present with a grade of 2/6  Pulmonary/Chest: Effort normal and breath sounds normal. No respiratory distress. He has no wheezes. He has no rales. He exhibits no tenderness.  Neurological: He is alert and oriented to person, place, and time. Coordination normal.      Psychiatric: He has a normal mood and affect. His behavior is normal. Judgment and thought content normal.         Assessment and Plan   1. DIABETES MELLITUS, TYPE I  Ambulatory referral to Endocrinology  Deteriorated. In probable DKA. Will send straight to ER- triage nurse in ER notified. Will also refer to endocrinology after he is discharged. Refused to go to hospital via EMS.

## 2012-01-04 NOTE — ED Notes (Signed)
[id number] Ready

## 2012-01-04 NOTE — Progress Notes (Signed)
Addended by: Eliezer Bottom on: 01/04/2012 02:05 PM   Modules accepted: Orders

## 2012-01-04 NOTE — H&P (Addendum)
PCP:   [name], MD, MD   Chief Complaint:  Generalized weakness, urinary frequency and increased thirst  HPI: 41 year old Caucasian male patient with history of type 1 diabetes mellitus since his age of 89 years, end-stage renal disease on dialysis Tuesdays, Thursdays and Saturdays, last dialyzed on Saturday 2 days ago, diabetic peripheral nephropathy, multiple bilateral toe amputations, chronic right foot ulcer, hypertension, hyperlipidemia, hypothyroidism, myocardial infarction, coronary artery disease status post CABG was not fully compliant with his insulin regimen. He presents with 3-4 days history of worsening generalized weakness, polyuria, nocturia up to 8-9 times per night and increased thirst. He does not check his blood sugars at home and does not take short-acting insulins for greater than a year. He indicates that his diabetes is brittle and he easily goes hypoglycemic. According to his girlfriend who is at the bedside he has not had any recent hypoglycemic episodes in the last one was in July of 2012. His last hemoglobin A1c a couple of weeks ago was said to be 15. He presented to the emergency department where his blood sugar was greater than 800 mg/dL, sodium 161, BUN 54, creatinine 4.51, bicarbonate 22. The hospitalist service is requested to admit for further evaluation and management.  Patient gives approximately 3 week history of cough with intermittent white sputum but no chest pain or dyspnea or fever or chills. He also complains of an ulcer under the right foot which he as had for 3-4 weeks from which she has intermittent clear drainage but no pus and no pain because he is now sensations in the feet.  Past Medical History: Past Medical History  Diagnosis Date  . End stage renal disease on dialysis     LUE fistula  . Diabetes mellitus     TYPE I  . Diabetic neuropathy     sever, s/p multiple toe amputation  . Thyroid disease     hypothyroidism  . Coronary artery disease  10/2009    CABG; Hx. of MI  . Myocardial infarction   . Hypertension   . Hyperlipidemia   . MI (myocardial infarction)   . ESRD on hemodialysis   . Heart murmur   . Shortness of breath   . Hypothyroidism   . H/O hiatal hernia   . Neuromuscular disorder     Diabetic neuropathy    Past Surgical History: Past Surgical History  Procedure Date  . Dialysis fistula creation     left upper air fistula  . Amputation     toe  . Coronary artery bypass graft 10/2009    [facility] (Dr. Katrinka Blazing)  . Abscess drainage Behind right ear/occipital scalp  . Skin graft     Allergies:   Allergies  Allergen Reactions  . Augmentin Nausea And Vomiting  . Hydromorphone Hcl     "body started down"  . Rifampin Nausea And Vomiting    Medications: Prior to Admission medications   Medication Sig Start Date End Date Taking? Authorizing Provider  ALPRAZolam Prudy Feeler) 0.5 MG tablet Take 1 tablet (0.5 mg total) by mouth 3 (three) times daily as needed for sleep or anxiety. 05/21/11 05/20/12 Yes [name], MD  aspirin 325 MG tablet Take 325 mg by mouth daily.     Yes Historical Provider, MD  carvedilol (COREG) 25 MG tablet Take 1 tablet (25 mg total) by mouth 2 (two) times daily with a meal. 08/10/11  Yes [name], MD  cinacalcet (SENSIPAR) 60 MG tablet Take 1 tablet (60 mg total) by mouth  daily. With biggest meal 02/16/11  Yes [name], MD  furosemide (LASIX) 80 MG tablet Take 1 tablet (80 mg total) by mouth 2 (two) times daily. 09/10/11  Yes [name], MD  insulin glargine (LANTUS) 100 UNIT/ML injection Inject 15 Units into the skin daily. 01/04/12  Yes [name], MD  isosorbide mononitrate (IMDUR) 30 MG 24 hr tablet Take 1 tablet (30 mg total) by mouth 2 (two) times daily. 02/11/11  Yes Antonieta Iba, MD  levothyroxine (SYNTHROID, LEVOTHROID) 50 MCG tablet TAKE ONE TABLET BY MOUTH DAILY 07/01/11  Yes [name], MD  mupirocin ointment (BACTROBAN) 2 % Apply 1 application topically daily.   Yes Historical  Provider, MD  Omega-3 Fatty Acids (FISH OIL) 1000 MG CAPS Take 1,000 mg by mouth 1 dose over 46 hours.     Yes Historical Provider, MD  omeprazole (PRILOSEC) 40 MG capsule Take 1 capsule (40 mg total) by mouth daily. 07/10/11  Yes Antonieta Iba, MD  pregabalin (LYRICA) 150 MG capsule Take 1 capsule (150 mg total) by mouth 2 (two) times daily. 07/08/11  Yes [name], MD  rosuvastatin (CRESTOR) 20 MG tablet Take 1 tablet (20 mg total) by mouth daily. 07/10/11  Yes Antonieta Iba, MD  insulin aspart (NOVOLOG) 100 UNIT/ML injection Inject 3-10 Units into the skin 3 (three) times daily before meals. Use as directed (sliding scale)    Historical Provider, MD    Family History: Family History  Problem Relation Age of Onset  . Heart disease Other   . Hypertension Other   . Hypertension Mother   . Heart disease Mother   . Diabetes Mother     Social History:  reports that he has never smoked. He has quit using smokeless tobacco. He reports that he does not drink alcohol or use illicit drugs. patient is independent of activities of daily living  Review of Systems:  All systems reviewed and apart from history of presenting illness is pertinent for an episode of vomiting this morning after hacking cough. He denies any further nausea or vomiting or abdominal pain or constipation or diarrhea. No dysuria or fevers or chills. Numbness and decreased sensation in his legs. He also had incision and drainage of an abscess behind his right ear/lower occipital scalp approximately 3-4 weeks ago and he has a nontender mild swelling in that area.  Physical Exam: Filed Vitals:   01/04/12 1324 01/04/12 1638  BP: 183/79 187/87  Pulse: 86 87  Temp: 97.7 F (36.5 C) 98.1 F (36.7 C)  TempSrc: Oral Oral  Resp: 20   Height: 6\' 3"  (1.905 m)   Weight: 89.812 kg (198 lb)   SpO2: 100% 98%   General appearance: Moderately built and nourished male patient who is lying comfortably in the gurney and is in no  obvious distress.  Head: Nontraumatic and normocephalic.  Eyes: Pupils equally reacting to light and accommodation.  Ears: Normal  Nose: No acute findings. No sinus tenderness.  Throat: Mucosa is dry. No oral thrush.  Neck: Supple. No JVD. Patient has transmitted thrill/bruit from left arm AV fistula to his left side of neck. Lymph nodes: No lymphadenopathy.  Resp: Clear to auscultation. No increased work of breathing.  Cardio: First and second heart sounds heard, regular rate and rythm. Soft 2 x 6 systolic murmur heard at apex. No JVD or gallop. 1+ right greater than the left leg edema.  GI: Non distended. Soft and nontender. No organomegaly or masses appreciated. Normal bowel sounds heard.  Extremities: symmetric 5/5 power. Patient has pitting 1+ right greater than the left leg edema below the knees. He has a right foot with first and second toes amputated. There is an approximately 2 cm diameter circular area of dark scab on the plantar aspect underneath the right forefoot. There no acute findings there. Left first and partial second toe amputated. Decreased sensations in both legs and feet. Peripheral pulses felt well on the left side and diminished on the right but good capillary refill and normal temperature. Skin: No other acute findings. Patient has approximately 3 cm diet meter circular soft swelling on the right side behind the ear near the hairline which does not have any acute findings and is soft. Neurologic: Alert and oriented. No focal neurological deficits.   Labs on Admission:   Basename 01/04/12 1340  NA 120*  K 4.9  CL 82*  CO2 22  GLUCOSE 844*  BUN 54*  CREATININE 4.51*  CALCIUM 9.9  MG --  PHOS --    Basename 01/04/12 1340  AST 20  ALT 25  ALKPHOS 234*  BILITOT 0.7  PROT 8.0  ALBUMIN 3.1*   No results found for this basename: LIPASE:2,AMYLASE:2 in the last 72 hours  Basename 01/04/12 1340  WBC 8.6  NEUTROABS --  HGB 11.1*  HCT 34.0*  MCV 89.7  PLT  120*   No results found for this basename: CKTOTAL:3,CKMB:3,CKMBINDEX:3,TROPONINI:3 in the last 72 hours No results found for this basename: TSH,T4TOTAL,FREET3,T3FREE,THYROIDAB in the last 72 hours No results found for this basename: VITAMINB12:2,FOLATE:2,FERRITIN:2,TIBC:2,IRON:2,RETICCTPCT:2 in the last 72 hours  Radiological Exams on Admission: No results found.    Assessment/Plan Present on Admission:  .Diabetic hyperosmolar non-ketotic state .DIABETES MELLITUS, TYPE I .End stage renal disease .HTN (hypertension) .HYPOTHYROIDISM .Type 1 diabetes mellitus with diabetic foot ulcer .Diabetic peripheral neuropathy .Hyponatremia  1. Diabetic hyperosmolar nonketotic state in patient with poorly controlled/? Brittle1 diabetes mellitus secondary to poor compliance. Admit to step down. Gentle brief IV normal saline. Insulin drip. Check a BMP in a couple of hours. We'll transition to Lantus and sensitive sliding scale insulin when blood sugars are appropriately controlled on insulin drip. Diabetes coordinator consulted to assist in counseling and management. His primary care physician is arranging for outpatient consultation with endocrinology. 2. Dehydration: Brief IV fluids. 3. Hyponatremia/pseudohyponatremia secondary to dehydration and hyperglycemia and end-stage renal disease. Management as above and follow BMP closely. 4. Uncontrolled hypertension: Resume home medications and monitor. 5. End-stage renal disease likely secondary to diabetic nephropathy: [facility] consult nephrology for inpatient dialysis and management. 6. Hypothyroidism: Continue Synthroid 7. Diabetic peripheral neuropathy with diabetic foot ulcer on the right foot and multiple toe amputations: This appears to be clean and scabbed. Will get wound care consultation. 8. Coronary artery disease status post CABG: Patient is asymptomatic. Will continue aspirin, statins and beta blockers. 9. Thrombocytopenia: Unclear etiology.  Followup daily CBC's. 10. DO NOT RESUSCITATE. Patient actually volunteered for DO NOT RESUSCITATE. This was discussed in great detail with him and he indicates that he's been through a lot in the last 30+ years and he understands this decision clearly and is comfortable making it.  Discussed with nephrology, Dr. Eliott Nine. She thinks that this patient has hyperosmolar state and bicarbonate is appropriate for somebody who is due for dialysis tomorrow. She agrees with gentle hydration and close monitoring of BMP and repleting potassium as needed. Potassium is likely to drop after insulin.  Adalai Perl 01/04/2012, 6:56 PM

## 2012-01-04 NOTE — Patient Instructions (Signed)
Good to see you, please go straight to [facility] ER. I think you may be in DKA again. We will set up an appointment for you with an endocrinologist.

## 2012-01-04 NOTE — ED Provider Notes (Signed)
History     CSN: [id number]  Arrival date & time 01/04/12  1302   First MD Initiated Contact with Patient 01/04/12 1650      Chief Complaint  Patient presents with  . Hyperglycemia    (Consider location/radiation/quality/duration/timing/severity/associated sxs/prior treatment) HPI Comments: History of IDDM, ESRD on HD.  For eval of elevated blood sugar, feels as though he may be in DKA.    Patient is a 40 y.o. male presenting with weakness. The history is provided by the patient.  Weakness The primary symptoms include nausea. Primary symptoms do not include fever or vomiting. The symptoms began 3 to 5 days ago. The symptoms are worsening. The neurological symptoms are diffuse.  Additional symptoms include weakness.    Past Medical History  Diagnosis Date  . End stage renal disease on dialysis     LUE fistula  . Diabetes mellitus     TYPE I  . Diabetic neuropathy     sever, s/p multiple toe amputation  . Thyroid disease     hypothyroidism  . Coronary artery disease 10/2009    CABG; Hx. of MI  . Myocardial infarction     Past Surgical History  Procedure Date  . Dialysis fistula creation     left upper air fistula  . Amputation     toe  . Coronary artery bypass graft 10/2009    [facility] (Dr. Katrinka Blazing)    Family History  Problem Relation Age of Onset  . Heart disease Other   . Hypertension Other   . Hypertension Mother   . Heart disease Mother     History  Substance Use Topics  . Smoking status: Never Smoker   . Smokeless tobacco: Not on file  . Alcohol Use: No      Review of Systems  Constitutional: Negative for fever.  Gastrointestinal: Positive for nausea. Negative for vomiting.  Neurological: Positive for weakness.  All other systems reviewed and are negative.    Allergies  Augmentin; Hydromorphone hcl; and Rifampin  Home Medications   Current Outpatient Rx  Name Route Sig Dispense Refill  . ALPRAZOLAM 0.5 MG PO TABS Oral Take 1 tablet (0.5 mg  total) by mouth 3 (three) times daily as needed for sleep or anxiety. 90 tablet 0  . ASPIRIN 325 MG PO TABS Oral Take 325 mg by mouth daily.      [name] CARVEDILOL 25 MG PO TABS Oral Take 1 tablet (25 mg total) by mouth 2 (two) times daily with a meal. 180 tablet 3  . CINACALCET HCL 60 MG PO TABS Oral Take 1 tablet (60 mg total) by mouth daily. With biggest meal 1 tablet 3  . FUROSEMIDE 80 MG PO TABS Oral Take 1 tablet (80 mg total) by mouth 2 (two) times daily. 60 tablet 6  . INSULIN GLARGINE 100 UNIT/ML  SOLN Subcutaneous Inject 15 Units into the skin daily.    . ISOSORBIDE MONONITRATE ER 30 MG PO TB24 Oral Take 1 tablet (30 mg total) by mouth 2 (two) times daily. 60 tablet 6  . LEVOTHYROXINE SODIUM 50 MCG PO TABS  TAKE ONE TABLET BY MOUTH DAILY 30 tablet 6  . MUPIROCIN 2 % EX OINT Topical Apply 1 application topically daily.    [name] FISH OIL 1000 MG PO CAPS Oral Take 1,000 mg by mouth 1 dose over 46 hours.      . OMEPRAZOLE 40 MG PO CPDR Oral Take 1 capsule (40 mg total) by mouth daily. 30 capsule 6  .  PREGABALIN 150 MG PO CAPS Oral Take 1 capsule (150 mg total) by mouth 2 (two) times daily. 60 capsule 3  . ROSUVASTATIN CALCIUM 20 MG PO TABS Oral Take 1 tablet (20 mg total) by mouth daily. 30 tablet 6  . INSULIN ASPART 100 UNIT/ML [place] SOLN Subcutaneous Inject 3-10 Units into the skin 3 (three) times daily before meals. Use as directed (sliding scale)      BP 187/87  Pulse 87  Temp(Src) 98.1 F (36.7 C) (Oral)  Resp 20  Ht 6\' 3"  (1.905 m)  Wt 198 lb (89.812 kg)  BMI 24.75 kg/m2  SpO2 98%  Physical Exam  Nursing note and vitals reviewed. Constitutional: He is oriented to person, place, and time.       Appears pale, chronically-ill.  HENT:  Head: Normocephalic and atraumatic.  Neck: Normal range of motion. Neck supple.  Cardiovascular: Normal rate and regular rhythm.   No murmur heard. Pulmonary/Chest: Effort normal and breath sounds normal. No respiratory distress.  Abdominal: Soft.  Bowel sounds are normal. He exhibits no distension. There is no tenderness.  Musculoskeletal: Normal range of motion. He exhibits no edema.       There is a 2 cm round ulcer to the bottom of the right foot at the distal 4th and 5th metatarsals.  Neurological: He is alert and oriented to person, place, and time.    ED Course  Procedures (including critical care time)  Labs Reviewed  GLUCOSE, CAPILLARY - Abnormal; Notable for the following:    Glucose-Capillary >600 (*)    All other components within normal limits  COMPREHENSIVE METABOLIC PANEL - Abnormal; Notable for the following:    Sodium 120 (*)    Chloride 82 (*)    Glucose, Bld 844 (*)    BUN 54 (*)    Creatinine, Ser 4.51 (*)    Albumin 3.1 (*)    Alkaline Phosphatase 234 (*)    GFR calc non Af Amer 15 (*)    GFR calc Af Amer 17 (*)    All other components within normal limits  CBC - Abnormal; Notable for the following:    RBC 3.79 (*)    Hemoglobin 11.1 (*)    HCT 34.0 (*)    Platelets 120 (*)    All other components within normal limits   No results found.   No diagnosis found.    MDM  Labs show elevated BS of 849.  CO2 is 22 with an anion gap of 16.  He also has a diabetic ulcer to the bottom of the right foot.  He will be given ivf's cautiously, insulin, and I will consult internal medicine for admission.        Geoffery Lyons, MD 01/04/12 1754

## 2012-01-05 ENCOUNTER — Inpatient Hospital Stay (HOSPITAL_COMMUNITY): Payer: Medicare Other

## 2012-01-05 DIAGNOSIS — L03221 Cellulitis of neck: Secondary | ICD-10-CM

## 2012-01-05 DIAGNOSIS — L0211 Cutaneous abscess of neck: Secondary | ICD-10-CM

## 2012-01-05 LAB — CBC
MCV: 86.3 fL (ref 78.0–100.0)
Platelets: 102 10*3/uL — ABNORMAL LOW (ref 150–400)
RDW: 13.9 % (ref 11.5–15.5)
WBC: 6.5 10*3/uL (ref 4.0–10.5)

## 2012-01-05 LAB — GLUCOSE, CAPILLARY
Glucose-Capillary: 116 mg/dL — ABNORMAL HIGH (ref 70–99)
Glucose-Capillary: 173 mg/dL — ABNORMAL HIGH (ref 70–99)
Glucose-Capillary: 342 mg/dL — ABNORMAL HIGH (ref 70–99)
Glucose-Capillary: 359 mg/dL — ABNORMAL HIGH (ref 70–99)
Glucose-Capillary: 363 mg/dL — ABNORMAL HIGH (ref 70–99)

## 2012-01-05 LAB — MRSA PCR SCREENING: MRSA by PCR: NEGATIVE

## 2012-01-05 LAB — BASIC METABOLIC PANEL
CO2: 21 mEq/L (ref 19–32)
Calcium: 9.6 mg/dL (ref 8.4–10.5)
Creatinine, Ser: 4.91 mg/dL — ABNORMAL HIGH (ref 0.50–1.35)
GFR calc non Af Amer: 13 mL/min — ABNORMAL LOW (ref >90)

## 2012-01-05 LAB — PHOSPHORUS: Phosphorus: 4.3 mg/dL (ref 2.3–4.6)

## 2012-01-05 LAB — MAGNESIUM: Magnesium: 2.2 mg/dL (ref 1.5–2.5)

## 2012-01-05 LAB — HEMOGLOBIN A1C
Hgb A1c MFr Bld: 15.5 % — ABNORMAL HIGH (ref <5.7)
Mean Plasma Glucose: 398 mg/dL — ABNORMAL HIGH (ref <117)

## 2012-01-05 MED ORDER — HEPARIN SODIUM (PORCINE) 1000 UNIT/ML DIALYSIS
1000.0000 [IU] | INTRAMUSCULAR | Status: DC | PRN
  Filled 2012-01-05: qty 1

## 2012-01-05 MED ORDER — PRO-STAT SUGAR FREE PO LIQD
30.0000 mL | Freq: Two times a day (BID) | ORAL | Status: DC
  Administered 2012-01-05 – 2012-01-07 (×2): 30 mL via ORAL
  Filled 2012-01-05 (×11): qty 30

## 2012-01-05 MED ORDER — NEPRO/CARBSTEADY PO LIQD
237.0000 mL | ORAL | Status: DC | PRN
  Filled 2012-01-05: qty 237

## 2012-01-05 MED ORDER — SODIUM CHLORIDE 0.9 % IV SOLN: 100.0000 mL | INTRAVENOUS | Status: DC | PRN

## 2012-01-05 MED ORDER — INSULIN GLARGINE 100 UNIT/ML ~~LOC~~ SOLN
8.0000 [IU] | Freq: Every day | SUBCUTANEOUS | Status: DC
  Administered 2012-01-05: 8 [IU] via SUBCUTANEOUS
  Filled 2012-01-05: qty 3

## 2012-01-05 MED ORDER — HEPARIN SODIUM (PORCINE) 1000 UNIT/ML DIALYSIS
1000.0000 [IU] | INTRAMUSCULAR | Status: DC | PRN
  Administered 2012-01-05: 1000 [IU] via INTRAVENOUS_CENTRAL
  Filled 2012-01-05: qty 1

## 2012-01-05 MED ORDER — LIDOCAINE-PRILOCAINE 2.5-2.5 % EX CREA
1.0000 "application " | TOPICAL_CREAM | CUTANEOUS | Status: DC | PRN
  Filled 2012-01-05: qty 5

## 2012-01-05 MED ORDER — INSULIN ASPART 100 UNIT/ML ~~LOC~~ SOLN
4.0000 [IU] | Freq: Once | SUBCUTANEOUS | Status: AC
  Administered 2012-01-05: 4 [IU] via SUBCUTANEOUS
  Filled 2012-01-05: qty 3

## 2012-01-05 MED ORDER — PENTAFLUOROPROP-TETRAFLUOROETH EX AERO: 1.0000 "application " | INHALATION_SPRAY | CUTANEOUS | Status: DC | PRN

## 2012-01-05 MED ORDER — COLLAGENASE 250 UNIT/GM EX OINT
TOPICAL_OINTMENT | Freq: Every day | CUTANEOUS | Status: DC
  Administered 2012-01-05 – 2012-01-06 (×2): via TOPICAL
  Administered 2012-01-07 – 2012-01-08 (×2): 1 via TOPICAL
  Administered 2012-01-09: 18:00:00 via TOPICAL
  Filled 2012-01-05: qty 30

## 2012-01-05 MED ORDER — LIDOCAINE HCL (PF) 1 % IJ SOLN
5.0000 mL | INTRAMUSCULAR | Status: DC | PRN
  Filled 2012-01-05: qty 5

## 2012-01-05 MED ORDER — ALTEPLASE 2 MG IJ SOLR
2.0000 mg | Freq: Once | INTRAMUSCULAR | Status: AC | PRN
  Filled 2012-01-05: qty 2

## 2012-01-05 MED ORDER — INSULIN ASPART 100 UNIT/ML ~~LOC~~ SOLN
0.0000 [IU] | Freq: Three times a day (TID) | SUBCUTANEOUS | Status: DC
  Administered 2012-01-05 (×2): 2 [IU] via SUBCUTANEOUS
  Filled 2012-01-05: qty 3

## 2012-01-05 MED ORDER — LIVING WELL WITH DIABETES BOOK
Freq: Once | Status: AC
  Administered 2012-01-05: 18:00:00
  Filled 2012-01-05: qty 1

## 2012-01-05 NOTE — Progress Notes (Signed)
Subjective: No acute issues overnight.  Was able to be taken off of the insulin gtt.  No acute complaints currently.  Mentions that he had an abscess drained approximately 3 weeks ago.  Since then has grown in size but he mentions that it is non painful to the touch.    Objective: Filed Vitals:   01/05/12 1630 01/05/12 1700 01/05/12 1708 01/05/12 1805  BP: 145/72 156/77 156/77 158/75  Pulse: 81 82 82 86  Temp:  97.6 F (36.4 C) 97.6 F (36.4 C)   TempSrc:   Oral   Resp: 18 18 18 19   Height:      Weight:  86.5 kg (190 lb 11.2 oz)    SpO2:  97%  98%   Weight change:   Intake/Output Summary (Last 24 hours) at 01/05/12 1945 Last data filed at 01/05/12 1708  Gross per 24 hour  Intake   1583 ml  Output   2800 ml  Net  -1217 ml    General: Alert, awake, oriented x3, in no acute distress.  HEENT: At right posterior neck near hear line laterally there is ~ 2 x 2cm erythematous cystic mass that has fluctuance. No bruits, no goiter.  Heart: Regular rate and rhythm, without murmurs, rubs, gallops.  Lungs: Clear to auscultation  Abdomen: Soft, nontender, nondistended, positive bowel sounds.  Neuro: Grossly intact, nonfocal. Extremity:  There is a ~ 1.5 x1.5 cm ulcer at plantar lateral right foot near metatarsal 4 and 5.   Lab Results:  Basename 01/05/12 0528 01/04/12 2250  NA 132* 127*  K 3.2* 3.6  CL 96 91*  CO2 21 22  GLUCOSE 142* 405*  BUN 58* 57*  CREATININE 4.91* 4.70*  CALCIUM 9.6 9.9  MG 2.2 --  PHOS 4.3 --    Basename 01/04/12 1340  AST 20  ALT 25  ALKPHOS 234*  BILITOT 0.7  PROT 8.0  ALBUMIN 3.1*   No results found for this basename: LIPASE:2,AMYLASE:2 in the last 72 hours  Basename 01/05/12 0528 01/04/12 1340  WBC 6.5 8.6  NEUTROABS -- --  HGB 9.8* 11.1*  HCT 29.0* 34.0*  MCV 86.3 89.7  PLT 102* 120*   No results found for this basename: CKTOTAL:3,CKMB:3,CKMBINDEX:3,TROPONINI:3 in the last 72 hours No components found with this basename: POCBNP:3 No  results found for this basename: DDIMER:2 in the last 72 hours  Basename 01/04/12 1913  HGBA1C 15.5*   No results found for this basename: CHOL:2,HDL:2,LDLCALC:2,TRIG:2,CHOLHDL:2,LDLDIRECT:2 in the last 72 hours No results found for this basename: TSH,T4TOTAL,FREET3,T3FREE,THYROIDAB in the last 72 hours No results found for this basename: VITAMINB12:2,FOLATE:2,FERRITIN:2,TIBC:2,IRON:2,RETICCTPCT:2 in the last 72 hours  Micro Results: Recent Results (from the past 240 hour(s))  MRSA PCR SCREENING     Status: Normal   Collection Time   01/04/12 10:36 PM      Component Value Range Status Comment   MRSA by PCR NEGATIVE  NEGATIVE  Final     Studies/Results: Dg Chest 2 View  01/04/2012  *RADIOLOGY REPORT*  Clinical Data: Cough  CHEST - 2 VIEW  Comparison: 04/27/2011  Findings: Previous CABG.  Mild cardiomegaly.  Mild interstitial edema.  Small bilateral pleural effusions.  IMPRESSION:  1. Mild cardiomegaly,   mild interstitial edema and tiny pleural effusions.  Original Report Authenticated By: Osa Craver, M.D.   US Soft Tissue Head/neck  01/05/2012  *RADIOLOGY REPORT*  Clinical Data: Neck abscess.  Redness and swelling.  ULTRASOUND OF HEAD/NECK SOFT TISSUES  Technique:  Ultrasound examination of the  head and neck soft tissues was performed in the area of clinical concern.  Comparison:  None.  Findings: There is a 10 x 28 x 29 mm complex subcutaneous collection in the posterior right cervical region in the area of clinical concern.  IMPRESSION:  1.  Small residual subcutaneous collection in the right posterior cervical region.  Original Report Authenticated By: Osa Craver, M.D.    Medications: I have reviewed the patient's current medications.   Patient Active Hospital Problem List: Diabetic hyperosmolar non-ketotic state (01/04/2012) Resolved currently.  May have been secondary to patient non compliance with his medications but also given history of recent abscess could  be secondary to that as well.  Thus have ordered ultrasound of abscess and will have general surgery evaluate patient for further treatment options.  HYPOTHYROIDISM (01/26/2011) Stable currently. Pt on synthroid.  DIABETES MELLITUS, TYPE I (01/26/2011) Currently patient is on Lantus and SSI.  Blood sugars are better controlled today. 161-096  Will continue to monitor.  End stage renal disease (01/26/2011) Nephro on board and managing HD  HTN (hypertension) (02/11/2011) On current regimen has fluctuated from normotensive to 158/75 will continue to monitor.    Type 1 diabetes mellitus with diabetic foot ulcer (01/04/2012) Wound care consult has been placed.  Diabetic peripheral neuropathy (01/04/2012) Stable currently.  Hyponatremia (01/04/2012) Improving lately.  Will recheck sodium levels in the AM.  Fluids have been stopped.     LOS: 1 day   Penny Pia M.D.  [facility] 01/05/2012, 7:45 PM

## 2012-01-05 NOTE — Progress Notes (Addendum)
Inpatient Diabetes Program Recommendations  AACE/ADA: New Consensus Statement on Inpatient Glycemic Control (2009)  Target Ranges:  Prepandial:   less than 140 mg/dL      Peak postprandial:   less than 180 mg/dL (1-2 hours)      Critically ill patients:  140 - 180 mg/dL    Inpatient Diabetes Program Recommendations Insulin - Basal: Lantus 15 units  Insulin - Meal Coverage: May also benefit from low dose meal coverage  Will continue to follow.   Thank you Piedad Climes RN,BSN,CDE Inpatient Diabetes Program    1540 attempted to see patient but patient was gone to procedure.  Will attempt again on Wednesday. G.Jenine Krisher RN

## 2012-01-05 NOTE — Consult Note (Signed)
Reason for Consult: Right Posterior neck fluid collection Referring Physician: Louay Myrie is an 41 y.o. male.  HPI: Ask to see a 41 y/o male admitted with generalized weakness, polyuria, nocturia.  Glucose 844, and Na 120.  He has multiple issue as listed below.  We are  to see about the fluid collection right posterior neck.  He says he had a Golf ball size abscess there which was drained about 1 month ago in [place], by Dr. Electa Sniff.  No wick was placed he says it healed, but he's had fluid in that area since it was drained.  It is not painful or erythematous.  WBC, now and on admission was normal. He is afebrile, now and on admission. He also has non healing ulcer plantar surface Right foot.  Past Medical History  Diagnosis Date  . End stage renal disease on dialysis T-T-S     LUE fistula  . Diabetes mellitus     TYPE I  . Diabetic neuropathy     sever, s/p multiple toe amputation  . Thyroid disease     hypothyroidism  . Coronary artery disease 10/2009    CABG; Hx. of MI  . Myocardial infarction   . Hypertension   . Hyperlipidemia   . ESRD on hemodialysis   . Heart murmur   . Shortness of breath   . Hypothyroidism   . H/O hiatal hernia   . Neuromuscular disorder     Diabetic neuropathy  . GERD (gastroesophageal reflux disease)   . Anxiety   . Pneumonia     Past Surgical History  Procedure Date  . Dialysis fistula creation     left upper air fistula  . Amputation     toe  . Coronary artery bypass graft 10/2009    [facility] (Dr. Katrinka Blazing)  . Abscess drainage Behind right ear/occipital scalp  . Skin graft     Family History  Problem Relation Age of Onset  . Heart disease Other   . Hypertension Other   . Hypertension Mother   . Heart disease Mother   . Diabetes Mother     Social History:  reports that he has never smoked. He has quit using smokeless tobacco. He reports that he drinks alcohol. He reports that he does not use illicit drugs.  Allergies:   Allergies  Allergen Reactions  . Augmentin Nausea And Vomiting  . Hydromorphone Hcl     "body started down"  . Rifampin Nausea And Vomiting    Medications:  Prior to Admission:  Prescriptions prior to admission  Medication Sig Dispense Refill  . ALPRAZolam (XANAX) 0.5 MG tablet Take 1 tablet (0.5 mg total) by mouth 3 (three) times daily as needed for sleep or anxiety.  90 tablet  0  . aspirin 325 MG tablet Take 325 mg by mouth daily.        . carvedilol (COREG) 25 MG tablet Take 1 tablet (25 mg total) by mouth 2 (two) times daily with a meal.  180 tablet  3  . cinacalcet (SENSIPAR) 60 MG tablet Take 1 tablet (60 mg total) by mouth daily. With biggest meal  1 tablet  3  . furosemide (LASIX) 80 MG tablet Take 1 tablet (80 mg total) by mouth 2 (two) times daily.  60 tablet  6  . insulin glargine (LANTUS) 100 UNIT/ML injection Inject 15 Units into the skin daily.      . isosorbide mononitrate (IMDUR) 30 MG 24 hr tablet Take 1 tablet (  30 mg total) by mouth 2 (two) times daily.  60 tablet  6  . levothyroxine (SYNTHROID, LEVOTHROID) 50 MCG tablet TAKE ONE TABLET BY MOUTH DAILY  30 tablet  6  . mupirocin ointment (BACTROBAN) 2 % Apply 1 application topically daily.      . Omega-3 Fatty Acids (FISH OIL) 1000 MG CAPS Take 1,000 mg by mouth 1 dose over 46 hours.        [name] omeprazole (PRILOSEC) 40 MG capsule Take 1 capsule (40 mg total) by mouth daily.  30 capsule  6  . pregabalin (LYRICA) 150 MG capsule Take 1 capsule (150 mg total) by mouth 2 (two) times daily.  60 capsule  3  . rosuvastatin (CRESTOR) 20 MG tablet Take 1 tablet (20 mg total) by mouth daily.  30 tablet  6  . insulin aspart (NOVOLOG) 100 UNIT/ML injection Inject 3-10 Units into the skin 3 (three) times daily before meals. Use as directed (sliding scale)       Scheduled:   . aspirin  325 mg Oral Daily  . atorvastatin  40 mg Oral q1800  . carvedilol  25 mg Oral BID WC  . cinacalcet  60 mg Oral QAC breakfast  . collagenase    Topical Daily  . heparin  5,000 Units Subcutaneous Q8H  . insulin aspart  0-9 Units Subcutaneous TID WC  . insulin aspart  10 Units Intravenous Once  . insulin glargine  8 Units Subcutaneous Daily  . isosorbide mononitrate  30 mg Oral BID  . levothyroxine  50 mcg Oral QAC breakfast  . lisinopril  20 mg Oral Daily  . omega-3 acid ethyl esters  1 g Oral Daily  . pantoprazole  80 mg Oral Q1200  . pregabalin  150 mg Oral BID  . sodium chloride  3 mL Intravenous Q12H  . DISCONTD: sodium chloride   Intravenous STAT  . DISCONTD: insulin regular  0-10 Units Intravenous TID WC  . DISCONTD: mupirocin ointment  1 application Topical Daily   Continuous:   . DISCONTD: sodium chloride Stopped (01/05/12 1012)  . DISCONTD: dextrose 5 % and 0.45% NaCl 50 mL/hr at 01/04/12 2255  . DISCONTD: insulin (NOVOLIN-R) infusion Stopped (01/05/12 0800)   FAO:ZHYQMV chloride, sodium chloride, acetaminophen, acetaminophen, albuterol, ALPRAZolam, alteplase, dextrose, feeding supplement (NEPRO CARB STEADY), heparin, heparin, lidocaine, lidocaine-prilocaine, ondansetron (ZOFRAN) IV, ondansetron, pentafluoroprop-tetrafluoroeth Anti-infectives    None      Results for orders placed during the hospital encounter of 01/04/12 (from the past 48 hour(s))  GLUCOSE, CAPILLARY     Status: Abnormal   Collection Time   01/04/12  1:23 PM      Component Value Range Comment   Glucose-Capillary >600 (*) 70 - 99 (mg/dL)   COMPREHENSIVE METABOLIC PANEL     Status: Abnormal   Collection Time   01/04/12  1:40 PM      Component Value Range Comment   Sodium 120 (*) 135 - 145 (mEq/L)    Potassium 4.9  3.5 - 5.1 (mEq/L)    Chloride 82 (*) 96 - 112 (mEq/L)    CO2 22  19 - 32 (mEq/L)    Glucose, Bld 844 (*) 70 - 99 (mg/dL)    BUN 54 (*) 6 - 23 (mg/dL)    Creatinine, Ser 7.84 (*) 0.50 - 1.35 (mg/dL)    Calcium 9.9  8.4 - 10.5 (mg/dL)    Total Protein 8.0  6.0 - 8.3 (g/dL)    Albumin 3.1 (*) 3.5 - 5.2 (g/dL)  AST 20  0 - 37  (U/L)    ALT 25  0 - 53 (U/L)    Alkaline Phosphatase 234 (*) 39 - 117 (U/L)    Total Bilirubin 0.7  0.3 - 1.2 (mg/dL)    GFR calc non Af Amer 15 (*) >90 (mL/min)    GFR calc Af Amer 17 (*) >90 (mL/min)   CBC     Status: Abnormal   Collection Time   01/04/12  1:40 PM      Component Value Range Comment   WBC 8.6  4.0 - 10.5 (K/uL)    RBC 3.79 (*) 4.22 - 5.81 (MIL/uL)    Hemoglobin 11.1 (*) 13.0 - 17.0 (g/dL)    HCT 14.7 (*) 82.9 - 52.0 (%)    MCV 89.7  78.0 - 100.0 (fL)    MCH 29.3  26.0 - 34.0 (pg)    MCHC 32.6  30.0 - 36.0 (g/dL)    RDW 56.2  13.0 - 86.5 (%)    Platelets 120 (*) 150 - 400 (K/uL)   URINALYSIS, ROUTINE W REFLEX MICROSCOPIC     Status: Abnormal   Collection Time   01/04/12  5:19 PM      Component Value Range Comment   Color, Urine YELLOW  YELLOW     APPearance CLEAR  CLEAR     Specific Gravity, Urine 1.016  1.005 - 1.030     pH 7.0  5.0 - 8.0     Glucose, UA >1000 (*) NEGATIVE (mg/dL)    Hgb urine dipstick MODERATE (*) NEGATIVE     Bilirubin Urine NEGATIVE  NEGATIVE     Ketones, ur 15 (*) NEGATIVE (mg/dL)    Protein, ur >784 (*) NEGATIVE (mg/dL)    Urobilinogen, UA 0.2  0.0 - 1.0 (mg/dL)    Nitrite NEGATIVE  NEGATIVE     Leukocytes, UA NEGATIVE  NEGATIVE    URINE MICROSCOPIC-ADD ON     Status: Normal   Collection Time   01/04/12  5:19 PM      Component Value Range Comment   Squamous Epithelial / LPF RARE  RARE     WBC, UA 0-2  <3 (WBC/hpf)    RBC / HPF 11-20  <3 (RBC/hpf)   GLUCOSE, CAPILLARY     Status: Abnormal   Collection Time   01/04/12  7:09 PM      Component Value Range Comment   Glucose-Capillary 577 (*) 70 - 99 (mg/dL)   HEMOGLOBIN O9G     Status: Abnormal   Collection Time   01/04/12  7:13 PM      Component Value Range Comment   Hemoglobin A1C 15.5 (*) <5.7 (%)    Mean Plasma Glucose 398 (*) <117 (mg/dL)   MRSA PCR SCREENING     Status: Normal   Collection Time   01/04/12 10:36 PM      Component Value Range Comment   MRSA by PCR NEGATIVE   NEGATIVE    GLUCOSE, CAPILLARY     Status: Abnormal   Collection Time   01/04/12 10:39 PM      Component Value Range Comment   Glucose-Capillary 354 (*) 70 - 99 (mg/dL)   BASIC METABOLIC PANEL     Status: Abnormal   Collection Time   01/04/12 10:50 PM      Component Value Range Comment   Sodium 127 (*) 135 - 145 (mEq/L) DELTA CHECK NOTED   Potassium 3.6  3.5 - 5.1 (mEq/L)    Chloride 91 (*)  96 - 112 (mEq/L)    CO2 22  19 - 32 (mEq/L)    Glucose, Bld 405 (*) 70 - 99 (mg/dL)    BUN 57 (*) 6 - 23 (mg/dL)    Creatinine, Ser 1.19 (*) 0.50 - 1.35 (mg/dL)    Calcium 9.9  8.4 - 10.5 (mg/dL)    GFR calc non Af Amer 14 (*) >90 (mL/min)    GFR calc Af Amer 16 (*) >90 (mL/min)   GLUCOSE, CAPILLARY     Status: Abnormal   Collection Time   01/05/12 12:00 AM      Component Value Range Comment   Glucose-Capillary 363 (*) 70 - 99 (mg/dL)   GLUCOSE, CAPILLARY     Status: Abnormal   Collection Time   01/05/12 12:54 AM      Component Value Range Comment   Glucose-Capillary 359 (*) 70 - 99 (mg/dL)   GLUCOSE, CAPILLARY     Status: Abnormal   Collection Time   01/05/12  1:58 AM      Component Value Range Comment   Glucose-Capillary 244 (*) 70 - 99 (mg/dL)   GLUCOSE, CAPILLARY     Status: Abnormal   Collection Time   01/05/12  3:00 AM      Component Value Range Comment   Glucose-Capillary 170 (*) 70 - 99 (mg/dL)   GLUCOSE, CAPILLARY     Status: Abnormal   Collection Time   01/05/12  4:03 AM      Component Value Range Comment   Glucose-Capillary 153 (*) 70 - 99 (mg/dL)   GLUCOSE, CAPILLARY     Status: Abnormal   Collection Time   01/05/12  5:09 AM      Component Value Range Comment   Glucose-Capillary 154 (*) 70 - 99 (mg/dL)   MAGNESIUM     Status: Normal   Collection Time   01/05/12  5:28 AM      Component Value Range Comment   Magnesium 2.2  1.5 - 2.5 (mg/dL)   PHOSPHORUS     Status: Normal   Collection Time   01/05/12  5:28 AM      Component Value Range Comment   Phosphorus 4.3  2.3 - 4.6  (mg/dL)   BASIC METABOLIC PANEL     Status: Abnormal   Collection Time   01/05/12  5:28 AM      Component Value Range Comment   Sodium 132 (*) 135 - 145 (mEq/L)    Potassium 3.2 (*) 3.5 - 5.1 (mEq/L)    Chloride 96  96 - 112 (mEq/L)    CO2 21  19 - 32 (mEq/L)    Glucose, Bld 142 (*) 70 - 99 (mg/dL)    BUN 58 (*) 6 - 23 (mg/dL)    Creatinine, Ser 1.47 (*) 0.50 - 1.35 (mg/dL)    Calcium 9.6  8.4 - 10.5 (mg/dL)    GFR calc non Af Amer 13 (*) >90 (mL/min)    GFR calc Af Amer 15 (*) >90 (mL/min)   CBC     Status: Abnormal   Collection Time   01/05/12  5:28 AM      Component Value Range Comment   WBC 6.5  4.0 - 10.5 (K/uL)    RBC 3.36 (*) 4.22 - 5.81 (MIL/uL)    Hemoglobin 9.8 (*) 13.0 - 17.0 (g/dL)    HCT 82.9 (*) 56.2 - 52.0 (%)    MCV 86.3  78.0 - 100.0 (fL)    MCH 29.2  26.0 - 34.0 (  pg)    MCHC 33.8  30.0 - 36.0 (g/dL)    RDW 16.1  09.6 - 04.5 (%)    Platelets 102 (*) 150 - 400 (K/uL) PLATELET COUNT CONFIRMED BY SMEAR  GLUCOSE, CAPILLARY     Status: Abnormal   Collection Time   01/05/12  6:04 AM      Component Value Range Comment   Glucose-Capillary 145 (*) 70 - 99 (mg/dL)   GLUCOSE, CAPILLARY     Status: Abnormal   Collection Time   01/05/12  7:06 AM      Component Value Range Comment   Glucose-Capillary 124 (*) 70 - 99 (mg/dL)   GLUCOSE, CAPILLARY     Status: Abnormal   Collection Time   01/05/12  7:41 AM      Component Value Range Comment   Glucose-Capillary 116 (*) 70 - 99 (mg/dL)     Dg Chest 2 View  02/15/8118  *RADIOLOGY REPORT*  Clinical Data: Cough  CHEST - 2 VIEW  Comparison: 04/27/2011  Findings: Previous CABG.  Mild cardiomegaly.  Mild interstitial edema.  Small bilateral pleural effusions.  IMPRESSION:  1. Mild cardiomegaly,   mild interstitial edema and tiny pleural effusions.  Original Report Authenticated By: Osa Craver, M.D.    Review of Systems  Constitutional: Negative for fever, chills and weight loss.  HENT: Negative.   Eyes: Negative.     Respiratory: Negative.   Cardiovascular: Negative.   Gastrointestinal: Negative for heartburn, nausea, abdominal pain and diarrhea. Vomiting: on admission.  Musculoskeletal: Negative.        Neuropathy in both lower extremities  Skin:       Golf ball sized area drained in [place] about 1 month ago. By surgeon there. Sealed but never completely resolved.  Endo/Heme/Allergies: Negative.   Psychiatric/Behavioral: Negative.    Blood pressure 138/72, pulse 77, temperature 97.9 F (36.6 C), temperature source Oral, resp. rate 24, height 6\' 3"  (1.905 m), weight 86.8 kg (191 lb 5.8 oz), SpO2 96.00%. Physical Exam  Constitutional: He is oriented to person, place, and time. He appears well-developed and well-nourished. No distress.  HENT:  Head: Normocephalic.  Nose: Nose normal.       Right posterior occipital area has a 2-2.5 cm fluctulant area, not tender, no erythema.  Area was drained about 1 month ago by Dr. Electa Sniff in [place].  Neck: Normal range of motion. Neck supple. No JVD present. No tracheal deviation present. No thyromegaly present.       Bruit on Left, from Aortic mumur  Cardiovascular: Normal rate and regular rhythm.  Exam reveals no gallop and no friction rub.   Murmur (II/VI SEM (aortic) goes to Left carotid) heard. Respiratory: Effort normal and breath sounds normal. No respiratory distress. He has no wheezes. He has no rales. He exhibits no tenderness.       Well healed sternotomy.  GI: Soft. Bowel sounds are normal. He exhibits no distension. There is no tenderness. There is no rebound and no guarding.  Musculoskeletal: He exhibits no edema and no tenderness.  Lymphadenopathy:    He has no cervical adenopathy.  Neurological: He is alert and oriented to person, place, and time. He has normal reflexes. No cranial nerve deficit. Coordination normal.  Skin: Skin is warm and dry. No rash noted. He is not diaphoretic. No erythema.  Psychiatric: He has a normal mood and  affect. His behavior is normal. Judgment and thought content normal.    Assessment/Plan: 1. Right posterior occipital fluid collection,  s/p drainage of abscess about 1 month ago 2.DKA; glucose 844, Na 120 3.Type I diabetes since age 58. 4.ESRD HD  T-T-S 5.CAD  S/P CABG 2010, hx of MI. 6.PVOD with multiple toe amputations bilateral 7.Diabetic Neuropathy 8.Hypertension  9.Dyslipidemia 10 Hypothyroid  11. Hx  hiatal hernia  Plan:  Ultrasound ordered, It isn't tender, or red. I can't really tell if there is an abscess here or it's just fluid after the prior I&D.  Will discuss with Dr. Gerrit Friends.   Silvana Holecek 01/05/2012, 10:22 AM

## 2012-01-05 NOTE — Consult Note (Signed)
[facility]  HISTORY AND PHYSICAL  Taylor [name] is an 41 y.o. male.    Chief Complaint: weakness/polydipsia/polyuria HPI: 41yo WM with PMH sig ESRD due to DM, CAD s/p MI, GERD, HTN, PVD, and medical noncompliance who presented to [facility] with 3-4 days of increasing weakness, polydipsia, and nocturia.  While in ED pt was found to have CBG>800 and was admitted for diabetic hyperosmolar non-ketotic state and started on an insulin drip.  We were asked to see the patient to help manage his ESRD and dialysis-related medical conditions.  PMH: Past Medical History  Diagnosis Date  . End stage renal disease on dialysis     LUE fistula  . Diabetes mellitus     TYPE I  . Diabetic neuropathy     sever, s/p multiple toe amputation  . Thyroid disease     hypothyroidism  . Coronary artery disease 10/2009    CABG; Hx. of MI  . Myocardial infarction   . Hypertension   . Hyperlipidemia   . MI (myocardial infarction)   . ESRD on hemodialysis   . Heart murmur   . Shortness of breath   . Hypothyroidism   . H/O hiatal hernia   . Neuromuscular disorder     Diabetic neuropathy  . GERD (gastroesophageal reflux disease)   . Anxiety   . Pneumonia    PSH: Past Surgical History  Procedure Date  . Dialysis fistula creation     left upper air fistula  . Amputation     toe  . Coronary artery bypass graft 10/2009    [facility] (Dr. Katrinka Blazing)  . Abscess drainage Behind right ear/occipital scalp  . Skin graft     DIALYSIS: Dialyzes at [facility] on TTS since July 2010. Primary Nephrologist Thedore Mins. EDW 86.5. HD Bath 2K/2.5Ca, Dialyzer gambro polyflux 21R 1030, Heparin 1000. Access Left upper AVF. Hectorol 2.5 mcg. Epogen 2200. Venofer 50mg  q Clovis Cao  Past Medical History  Diagnosis Date  . End stage renal disease on dialysis     LUE fistula  . Diabetes mellitus     TYPE I  . Diabetic neuropathy     sever, s/p multiple toe amputation  . Thyroid disease    hypothyroidism  . Coronary artery disease 10/2009    CABG; Hx. of MI  . Myocardial infarction   . Hypertension   . Hyperlipidemia   . MI (myocardial infarction)   . ESRD on hemodialysis   . Heart murmur   . Shortness of breath   . Hypothyroidism   . H/O hiatal hernia   . Neuromuscular disorder     Diabetic neuropathy  . GERD (gastroesophageal reflux disease)   . Anxiety   . Pneumonia     Medications:  I have reviewed the patient's current medications.  Medications Prior to Admission  Medication Dose Route Frequency Provider Last Rate Last Dose  . 0.9 %  sodium chloride infusion   Intravenous Continuous Marcellus Scott, MD 50 mL/hr at 01/04/12 2254    . acetaminophen (TYLENOL) tablet 650 mg  650 mg Oral Q6H PRN Marcellus Scott, MD       Or  . acetaminophen (TYLENOL) suppository 650 mg  650 mg Rectal Q6H PRN Marcellus Scott, MD      . albuterol (PROVENTIL) (5 MG/ML) 0.5% nebulizer solution 2.5 mg  2.5 mg Nebulization Q2H PRN Marcellus Scott, MD      . ALPRAZolam Prudy Feeler) tablet 0.5 mg  0.5 mg Oral TID PRN Marcellus Scott, MD      .  aspirin tablet 325 mg  325 mg Oral Daily Marcellus Scott, MD      . atorvastatin (LIPITOR) tablet 40 mg  40 mg Oral q1800 Marcellus Scott, MD      . carvedilol (COREG) tablet 25 mg  25 mg Oral BID WC Marcellus Scott, MD   25 mg at 01/05/12 0800  . cinacalcet (SENSIPAR) tablet 60 mg  60 mg Oral QAC breakfast Marcellus Scott, MD   60 mg at 01/05/12 0800  . dextrose 50 % solution 25 mL  25 mL Intravenous PRN Marcellus Scott, MD      . heparin injection 5,000 Units  5,000 Units Subcutaneous Q8H Marcellus Scott, MD   5,000 Units at 01/05/12 0602  . insulin aspart (novoLOG) injection 0-9 Units  0-9 Units Subcutaneous TID WC Maren Reamer, NP      . insulin aspart (novoLOG) injection 10 Units  10 Units Intravenous Once Geoffery Lyons, MD   10 Units at 01/04/12 1818  . insulin glargine (LANTUS) injection 8 Units  8 Units Subcutaneous Daily Maren Reamer, NP   8 Units at 01/05/12  0800  . isosorbide mononitrate (IMDUR) 24 hr tablet 30 mg  30 mg Oral BID Marcellus Scott, MD   30 mg at 01/04/12 2339  . levothyroxine (SYNTHROID, LEVOTHROID) tablet 50 mcg  50 mcg Oral QAC breakfast Marcellus Scott, MD   50 mcg at 01/05/12 0800  . lisinopril (PRINIVIL,ZESTRIL) tablet 20 mg  20 mg Oral Daily Marcellus Scott, MD      . mupirocin ointment (BACTROBAN) 2 % 1 application  1 application Topical Daily Marcellus Scott, MD      . omega-3 acid ethyl esters (LOVAZA) capsule 1 g  1 g Oral Daily Marcellus Scott, MD      . ondansetron (ZOFRAN) tablet 4 mg  4 mg Oral Q6H PRN Marcellus Scott, MD       Or  . ondansetron (ZOFRAN) injection 4 mg  4 mg Intravenous Q6H PRN Marcellus Scott, MD      . pantoprazole (PROTONIX) EC tablet 80 mg  80 mg Oral Q1200 Marcellus Scott, MD   80 mg at 01/04/12 2339  . pregabalin (LYRICA) capsule 150 mg  150 mg Oral BID Marcellus Scott, MD   150 mg at 01/04/12 2340  . sodium chloride 0.9 % injection 3 mL  3 mL Intravenous Q12H Marcellus Scott, MD   3 mL at 01/04/12 2344  . DISCONTD: 0.9 %  sodium chloride infusion   Intravenous STAT Geoffery Lyons, MD 75 mL/hr at 01/04/12 1911    . DISCONTD: dextrose 5 %-0.45 % sodium chloride infusion   Intravenous Continuous Marcellus Scott, MD 50 mL/hr at 01/04/12 2255    . DISCONTD: insulin regular (NOVOLIN R,HUMULIN R) 1 Units/mL in sodium chloride 0.9 % 100 mL infusion   Intravenous Continuous Marcellus Scott, MD      . DISCONTD: insulin regular bolus via infusion 0-10 Units  0-10 Units Intravenous TID WC Marcellus Scott, MD       Medications Prior to Admission  Medication Sig Dispense Refill  . ALPRAZolam (XANAX) 0.5 MG tablet Take 1 tablet (0.5 mg total) by mouth 3 (three) times daily as needed for sleep or anxiety.  90 tablet  0  . aspirin 325 MG tablet Take 325 mg by mouth daily.        . carvedilol (COREG) 25 MG tablet Take 1 tablet (25 mg total) by mouth 2 (two) times daily with a meal.  180 tablet  3  .  cinacalcet (SENSIPAR) 60 MG  tablet Take 1 tablet (60 mg total) by mouth daily. With biggest meal  1 tablet  3  . furosemide (LASIX) 80 MG tablet Take 1 tablet (80 mg total) by mouth 2 (two) times daily.  60 tablet  6  . isosorbide mononitrate (IMDUR) 30 MG 24 hr tablet Take 1 tablet (30 mg total) by mouth 2 (two) times daily.  60 tablet  6  . levothyroxine (SYNTHROID, LEVOTHROID) 50 MCG tablet TAKE ONE TABLET BY MOUTH DAILY  30 tablet  6  . Omega-3 Fatty Acids (FISH OIL) 1000 MG CAPS Take 1,000 mg by mouth 1 dose over 46 hours.        [name] omeprazole (PRILOSEC) 40 MG capsule Take 1 capsule (40 mg total) by mouth daily.  30 capsule  6  . pregabalin (LYRICA) 150 MG capsule Take 1 capsule (150 mg total) by mouth 2 (two) times daily.  60 capsule  3  . rosuvastatin (CRESTOR) 20 MG tablet Take 1 tablet (20 mg total) by mouth daily.  30 tablet  6  . insulin aspart (NOVOLOG) 100 UNIT/ML injection Inject 3-10 Units into the skin 3 (three) times daily before meals. Use as directed (sliding scale)        ALLERGIES:   Allergies  Allergen Reactions  . Augmentin Nausea And Vomiting  . Hydromorphone Hcl     "body started down"  . Rifampin Nausea And Vomiting    FAM HX: Family History  Problem Relation Age of Onset  . Heart disease Other   . Hypertension Other   . Hypertension Mother   . Heart disease Mother   . Diabetes Mother     Social History:   reports that he has never smoked. He has quit using smokeless tobacco. He reports that he drinks alcohol. He reports that he does not use illicit drugs.  ROS: Pertinent items are noted in HPI.  PE: General appearance: alert, cooperative and no distress Head: Normocephalic, without obvious abnormality, atraumatic Neck: no carotid bruit, no JVD, supple, symmetrical, trachea midline, thyroid not enlarged, symmetric, no tenderness/mass/nodules and mass at right posterior auricular area, firm nonfluctuant Resp: rales bibasilar Cardio: regular rate and rhythm, S1, S2 normal, no  murmur, click, rub or gallop GI: soft, non-tender; bowel sounds normal; no masses,  no organomegaly Extremities: edema 1+ and 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  ulcerated lesion at base of right 1st metatarsal, no fluctuance BMP:  Lab 01/05/12 0528 01/04/12 2250 01/04/12 1340  NA 132* 127* 120*  K 3.2* 3.6 4.9  CL 96 91* 82*  CO2 21 22 22   GLUCOSE 142* 405* 844*  BUN 58* 57* 54*  CREATININE 4.91* 4.70* 4.51*  ALB -- -- --  CALCIUM 9.6 9.9 9.9  PHOS 4.3 -- --   Liver Function Tests:  Lab 01/04/12 1340  AST 20  ALT 25  ALKPHOS 234*  BILITOT 0.7  PROT 8.0  ALBUMIN 3.1*   No results found for this basename: LIPASE:3,AMYLASE:3 in the last 168 hours No results found for this basename: AMMONIA:3 in the last 168 hours CBC:  Lab 01/05/12 0528 01/04/12 1340  WBC 6.5 8.6  NEUTROABS -- --  HGB 9.8* 11.1*  HCT 29.0* 34.0*  MCV 86.3 89.7  PLT 102* 120*   PT/INR: @labrcntip (inr:5) Cardiac Enzymes: No results found for this basename: CKTOTAL:5,CKMB:5,CKMBINDEX:5,TROPONINI:5 in the last 168 hours CBG:  Lab 01/05/12 0741 01/05/12 0706 01/05/12 0604  01/05/12 0509 01/05/12 0403  GLUCAP 116* 124* 145* 154* 153*    Iron Studies: No results found for this basename: IRON:30,TIBC:30,TRANSFERRIN:30,FERRITIN:30 in the last 168 hours  Results for orders placed during the hospital encounter of 01/04/12 (from the past 48 hour(s))  GLUCOSE, CAPILLARY     Status: Abnormal   Collection Time   01/04/12  1:23 PM      Component Value Range Comment   Glucose-Capillary >600 (*) 70 - 99 (mg/dL)   COMPREHENSIVE METABOLIC PANEL     Status: Abnormal   Collection Time   01/04/12  1:40 PM      Component Value Range Comment   Sodium 120 (*) 135 - 145 (mEq/L)    Potassium 4.9  3.5 - 5.1 (mEq/L)    Chloride 82 (*) 96 - 112 (mEq/L)    CO2 22  19 - 32 (mEq/L)    Glucose, Bld 844 (*) 70 - 99 (mg/dL)    BUN 54 (*) 6 - 23 (mg/dL)    Creatinine, Ser 4.09  (*) 0.50 - 1.35 (mg/dL)    Calcium 9.9  8.4 - 10.5 (mg/dL)    Total Protein 8.0  6.0 - 8.3 (g/dL)    Albumin 3.1 (*) 3.5 - 5.2 (g/dL)    AST 20  0 - 37 (U/L)    ALT 25  0 - 53 (U/L)    Alkaline Phosphatase 234 (*) 39 - 117 (U/L)    Total Bilirubin 0.7  0.3 - 1.2 (mg/dL)    GFR calc non Af Amer 15 (*) >90 (mL/min)    GFR calc Af Amer 17 (*) >90 (mL/min)   CBC     Status: Abnormal   Collection Time   01/04/12  1:40 PM      Component Value Range Comment   WBC 8.6  4.0 - 10.5 (K/uL)    RBC 3.79 (*) 4.22 - 5.81 (MIL/uL)    Hemoglobin 11.1 (*) 13.0 - 17.0 (g/dL)    HCT 81.1 (*) 91.4 - 52.0 (%)    MCV 89.7  78.0 - 100.0 (fL)    MCH 29.3  26.0 - 34.0 (pg)    MCHC 32.6  30.0 - 36.0 (g/dL)    RDW 78.2  95.6 - 21.3 (%)    Platelets 120 (*) 150 - 400 (K/uL)   URINALYSIS, ROUTINE W REFLEX MICROSCOPIC     Status: Abnormal   Collection Time   01/04/12  5:19 PM      Component Value Range Comment   Color, Urine YELLOW  YELLOW     APPearance CLEAR  CLEAR     Specific Gravity, Urine 1.016  1.005 - 1.030     pH 7.0  5.0 - 8.0     Glucose, UA >1000 (*) NEGATIVE (mg/dL)    Hgb urine dipstick MODERATE (*) NEGATIVE     Bilirubin Urine NEGATIVE  NEGATIVE     Ketones, ur 15 (*) NEGATIVE (mg/dL)    Protein, ur >086 (*) NEGATIVE (mg/dL)    Urobilinogen, UA 0.2  0.0 - 1.0 (mg/dL)    Nitrite NEGATIVE  NEGATIVE     Leukocytes, UA NEGATIVE  NEGATIVE    URINE MICROSCOPIC-ADD ON     Status: Normal   Collection Time   01/04/12  5:19 PM      Component Value Range Comment   Squamous Epithelial / LPF RARE  RARE     WBC, UA 0-2  <3 (WBC/hpf)    RBC / HPF 11-20  <3 (RBC/hpf)  GLUCOSE, CAPILLARY     Status: Abnormal   Collection Time   01/04/12  7:09 PM      Component Value Range Comment   Glucose-Capillary 577 (*) 70 - 99 (mg/dL)   HEMOGLOBIN A5W     Status: Abnormal   Collection Time   01/04/12  7:13 PM      Component Value Range Comment   Hemoglobin A1C 15.5 (*) <5.7 (%)    Mean Plasma Glucose 398 (*)  <117 (mg/dL)   MRSA PCR SCREENING     Status: Normal   Collection Time   01/04/12 10:36 PM      Component Value Range Comment   MRSA by PCR NEGATIVE  NEGATIVE    GLUCOSE, CAPILLARY     Status: Abnormal   Collection Time   01/04/12 10:39 PM      Component Value Range Comment   Glucose-Capillary 354 (*) 70 - 99 (mg/dL)   BASIC METABOLIC PANEL     Status: Abnormal   Collection Time   01/04/12 10:50 PM      Component Value Range Comment   Sodium 127 (*) 135 - 145 (mEq/L) DELTA CHECK NOTED   Potassium 3.6  3.5 - 5.1 (mEq/L)    Chloride 91 (*) 96 - 112 (mEq/L)    CO2 22  19 - 32 (mEq/L)    Glucose, Bld 405 (*) 70 - 99 (mg/dL)    BUN 57 (*) 6 - 23 (mg/dL)    Creatinine, Ser 0.98 (*) 0.50 - 1.35 (mg/dL)    Calcium 9.9  8.4 - 10.5 (mg/dL)    GFR calc non Af Amer 14 (*) >90 (mL/min)    GFR calc Af Amer 16 (*) >90 (mL/min)   GLUCOSE, CAPILLARY     Status: Abnormal   Collection Time   01/05/12 12:00 AM      Component Value Range Comment   Glucose-Capillary 363 (*) 70 - 99 (mg/dL)   GLUCOSE, CAPILLARY     Status: Abnormal   Collection Time   01/05/12 12:54 AM      Component Value Range Comment   Glucose-Capillary 359 (*) 70 - 99 (mg/dL)   GLUCOSE, CAPILLARY     Status: Abnormal   Collection Time   01/05/12  1:58 AM      Component Value Range Comment   Glucose-Capillary 244 (*) 70 - 99 (mg/dL)   GLUCOSE, CAPILLARY     Status: Abnormal   Collection Time   01/05/12  3:00 AM      Component Value Range Comment   Glucose-Capillary 170 (*) 70 - 99 (mg/dL)   GLUCOSE, CAPILLARY     Status: Abnormal   Collection Time   01/05/12  4:03 AM      Component Value Range Comment   Glucose-Capillary 153 (*) 70 - 99 (mg/dL)   GLUCOSE, CAPILLARY     Status: Abnormal   Collection Time   01/05/12  5:09 AM      Component Value Range Comment   Glucose-Capillary 154 (*) 70 - 99 (mg/dL)   MAGNESIUM     Status: Normal   Collection Time   01/05/12  5:28 AM      Component Value Range Comment   Magnesium 2.2   1.5 - 2.5 (mg/dL)   PHOSPHORUS     Status: Normal   Collection Time   01/05/12  5:28 AM      Component Value Range Comment   Phosphorus 4.3  2.3 - 4.6 (mg/dL)   BASIC METABOLIC PANEL  Status: Abnormal   Collection Time   01/05/12  5:28 AM      Component Value Range Comment   Sodium 132 (*) 135 - 145 (mEq/L)    Potassium 3.2 (*) 3.5 - 5.1 (mEq/L)    Chloride 96  96 - 112 (mEq/L)    CO2 21  19 - 32 (mEq/L)    Glucose, Bld 142 (*) 70 - 99 (mg/dL)    BUN 58 (*) 6 - 23 (mg/dL)    Creatinine, Ser 6.29 (*) 0.50 - 1.35 (mg/dL)    Calcium 9.6  8.4 - 10.5 (mg/dL)    GFR calc non Af Amer 13 (*) >90 (mL/min)    GFR calc Af Amer 15 (*) >90 (mL/min)   CBC     Status: Abnormal   Collection Time   01/05/12  5:28 AM      Component Value Range Comment   WBC 6.5  4.0 - 10.5 (K/uL)    RBC 3.36 (*) 4.22 - 5.81 (MIL/uL)    Hemoglobin 9.8 (*) 13.0 - 17.0 (g/dL)    HCT 52.8 (*) 41.3 - 52.0 (%)    MCV 86.3  78.0 - 100.0 (fL)    MCH 29.2  26.0 - 34.0 (pg)    MCHC 33.8  30.0 - 36.0 (g/dL)    RDW 24.4  01.0 - 27.2 (%)    Platelets 102 (*) 150 - 400 (K/uL) PLATELET COUNT CONFIRMED BY SMEAR  GLUCOSE, CAPILLARY     Status: Abnormal   Collection Time   01/05/12  6:04 AM      Component Value Range Comment   Glucose-Capillary 145 (*) 70 - 99 (mg/dL)   GLUCOSE, CAPILLARY     Status: Abnormal   Collection Time   01/05/12  7:06 AM      Component Value Range Comment   Glucose-Capillary 124 (*) 70 - 99 (mg/dL)   GLUCOSE, CAPILLARY     Status: Abnormal   Collection Time   01/05/12  7:41 AM      Component Value Range Comment   Glucose-Capillary 116 (*) 70 - 99 (mg/dL)     Dg Chest 2 View  5/36/6440  *RADIOLOGY REPORT*  Clinical Data: Cough  CHEST - 2 VIEW  Comparison: 04/27/2011  Findings: Previous CABG.  Mild cardiomegaly.  Mild interstitial edema.  Small bilateral pleural effusions.  IMPRESSION:  1. Mild cardiomegaly,   mild interstitial edema and tiny pleural effusions.  Original Report Authenticated By:  Osa Craver, M.D.    Assessment/Plan 1. Hyperglycemia- improved with insulin drip 2. ESRD- cont with TTS HD 3. HTN- stable 4. Anemia of chronic disease- continue with epo 5. 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 Dispo- per primary svc 7. Diabetic ulcers- per wouncare  Keelee Yankey A 01/05/2012, 8:40 AM

## 2012-01-05 NOTE — Consult Note (Signed)
WOC consult Note Reason for Consult: Consult requested for right foot wound. Wound type: Right plantar foot with chronic full thickness ulcer.  Pt  Has neuropathy and blood glucose has been very high.  States wound began as dry scab several weeks ago. He wears custom fitted diabetic shoes which have changed his gait, he states.  Multiple previous toe amputations, several areas of dry callous, including one to right plantar foot 2X2cm surrounded by generalized erythremia.  No odor or drainage or fluctuance to that site.  Measurement:Full thickness diabetic wound  2X2cm 100% dry eschar.  No odor or drainage or fluctuance. Dressing procedure/placement/frequency: Santyl for chemical debridement of nonviable tissue. Pt has been followed by podiatrist in past, recommend he resume followup again with this physicain after discharge for further debridement and offloading techniques.  Pt also has 4X4cm raised tender erythemous area behind right ear.  Has received I&D in past and feels like he may need this performed again.  No open wound or drainage.  PLEASE CONSULT [facility] IF FURTHER PLAN OF CARE DESIRED FOR THIS SITE. Will not plan to follow further unless re-consulted.  [address], RN, MSN, [employer]  [phone]

## 2012-01-05 NOTE — Progress Notes (Signed)
INITIAL ADULT NUTRITION ASSESSMENT Date: 01/05/2012   Time: 2:02 PM  Reason for Assessment: Nutrition Risk Report  ASSESSMENT: Male 41 y.o.  Dx: Diabetic hyperosmolar non-ketotic state  Hx:  Past Medical History  Diagnosis Date  . End stage renal disease on dialysis     LUE fistula  . Diabetes mellitus     TYPE I  . Diabetic neuropathy     sever, s/p multiple toe amputation  . Thyroid disease     hypothyroidism  . Coronary artery disease 10/2009    CABG; Hx. of MI  . Myocardial infarction   . Hypertension   . Hyperlipidemia   . MI (myocardial infarction)   . ESRD on hemodialysis   . Heart murmur   . Shortness of breath   . Hypothyroidism   . H/O hiatal hernia   . Neuromuscular disorder     Diabetic neuropathy  . GERD (gastroesophageal reflux disease)   . Anxiety   . Pneumonia     Related Meds:     . aspirin  325 mg Oral Daily  . atorvastatin  40 mg Oral q1800  . carvedilol  25 mg Oral BID WC  . cinacalcet  60 mg Oral QAC breakfast  . collagenase   Topical Daily  . heparin  5,000 Units Subcutaneous Q8H  . insulin aspart  0-9 Units Subcutaneous TID WC  . insulin aspart  10 Units Intravenous Once  . insulin glargine  8 Units Subcutaneous Daily  . isosorbide mononitrate  30 mg Oral BID  . levothyroxine  50 mcg Oral QAC breakfast  . lisinopril  20 mg Oral Daily  . omega-3 acid ethyl esters  1 g Oral Daily  . pantoprazole  80 mg Oral Q1200  . pregabalin  150 mg Oral BID  . sodium chloride  3 mL Intravenous Q12H  . DISCONTD: sodium chloride   Intravenous STAT  . DISCONTD: insulin regular  0-10 Units Intravenous TID WC  . DISCONTD: mupirocin ointment  1 application Topical Daily    Ht: 6\' 3"  (190.5 cm)  Wt: 195 lb 5.2 oz (88.6 kg)  Ideal Wt: 89 kg % Ideal Wt: 99%  Usual Wt: ~ 200 lb % Usual Wt: 97%  Body mass index is 24.41 kg/(m^2).  Food/Nutrition Related Hx: unintentional weight loss greater than 10 lbs within the last month per admission  nutrition screen  Labs:  CMP     Component Value Date/Time   NA 132* 01/05/2012 0528   K 3.2* 01/05/2012 0528   CL 96 01/05/2012 0528   CO2 21 01/05/2012 0528   GLUCOSE 142* 01/05/2012 0528   BUN 58* 01/05/2012 0528   CREATININE 4.91* 01/05/2012 0528   CALCIUM 9.6 01/05/2012 0528   PROT 8.0 01/04/2012 1340   ALBUMIN 3.1* 01/04/2012 1340   AST 20 01/04/2012 1340   ALT 25 01/04/2012 1340   ALKPHOS 234* 01/04/2012 1340   BILITOT 0.7 01/04/2012 1340   GFRNONAA 13* 01/05/2012 0528   GFRAA 15* 01/05/2012 0528    I/O last 3 completed shifts: In: 1243 [P.O.:240; I.V.:1003] Out: 500 [Urine:500] Total I/O In: 340 [P.O.:240; I.V.:100] Out: 300 [Urine:300]  CBG (last 3)   Basename 01/05/12 1224 01/05/12 0741 01/05/12 0706  GLUCAP 177* 116* 124*    Diet Order: Renal 80/90-12-11-1198 ml  Supplements/Tube Feeding: Nepro supplement -- for use in HD only  IVF:    DISCONTD: sodium chloride Last Rate: Stopped (01/05/12 1012)  DISCONTD: dextrose 5 % and 0.45% NaCl Last Rate: 50 mL/hr  at 01/04/12 2255  DISCONTD: insulin (NOVOLIN-R) infusion Last Rate: Stopped (01/05/12 0800)    Estimated Nutritional Needs:   Kcal: 2,200-2,300 Protein: 115-125 gm Fluid: 1.2 L  RD obtain nutrition hx from pt -- states his appetite is good at present and PTA; reports an approximate 5 lb weight loss x ~ 1 week (not significant) due to his high blood sugars & generalized weakness; per pt, PO intake 100% at meals (no % meal intake in flowsheet records); noted pt with diabetic ulcer to right foot -- CWOCN note reviewed 2/26; on HD treatments (Tues,Thurs,Sat); would benefit from additional protein to facilitate wound healing -- RD to order.  NUTRITION DIAGNOSIS: -Increased nutrient needs (NI-5.1).  Status: Ongoing  RELATED TO: wound healing  AS EVIDENCE BY: full thickness foot wound, estimated nutrition needs  MONITORING/EVALUATION(Goals): Goal: meet >90% of estimated nutrition needs to promote wound  healing Monitor: PO & supplement intake, weight, labs, I/O's  EDUCATION NEEDS: -No education needs identified at this time  INTERVENTION:  Add Prostat liquid protein 30 ml PO BID (72 kcals, 15 gm protein per dose)  RD to follow for nutrition care plan  Dietitian #: [phone]  DOCUMENTATION CODES Per approved criteria  -Not Applicable    Alger Memos 01/05/2012, 2:02 PM

## 2012-01-05 NOTE — Procedures (Signed)
Patient was seen on dialysis and the procedure was supervised. BFR 400 Via LUE AVF BP is 134/67.  Patient appears to be tolerating treatment well

## 2012-01-05 NOTE — Consult Note (Signed)
[facility] ([facility]) - ATTENDING: Patient not in room at this time.  Will evaluate in AM 2/27.  May be able to aspirate and send for culture. Velora Heckler, MD, FACS General & Endocrine Surgery [facility]

## 2012-01-05 NOTE — Progress Notes (Signed)
01/05/2012 [name] Case Management Note [phone]  Utilization review completed.  

## 2012-01-06 LAB — CBC
HCT: 30.4 % — ABNORMAL LOW (ref 39.0–52.0)
HCT: 30 % — ABNORMAL LOW (ref 39.0–52.0)
Hemoglobin: 9.9 g/dL — ABNORMAL LOW (ref 13.0–17.0)
MCHC: 32.6 g/dL (ref 30.0–36.0)
MCV: 90.4 fL (ref 78.0–100.0)
RBC: 3.41 MIL/uL — ABNORMAL LOW (ref 4.22–5.81)
RBC: 3.32 MIL/uL — ABNORMAL LOW (ref 4.22–5.81)
RDW: 14.4 % (ref 11.5–15.5)
WBC: 6.3 10*3/uL (ref 4.0–10.5)
WBC: 6.5 10*3/uL (ref 4.0–10.5)

## 2012-01-06 LAB — RENAL FUNCTION PANEL
Albumin: 2.6 g/dL — ABNORMAL LOW (ref 3.5–5.2)
BUN: 32 mg/dL — ABNORMAL HIGH (ref 6–23)
Chloride: 94 mEq/L — ABNORMAL LOW (ref 96–112)
Creatinine, Ser: 3.43 mg/dL — ABNORMAL HIGH (ref 0.50–1.35)
Glucose, Bld: 564 mg/dL (ref 70–99)
Potassium: 4.3 mEq/L (ref 3.5–5.1)

## 2012-01-06 LAB — GLUCOSE, CAPILLARY
Glucose-Capillary: 566 mg/dL (ref 70–99)
Glucose-Capillary: 585 mg/dL (ref 70–99)
Glucose-Capillary: 516 mg/dL — ABNORMAL HIGH (ref 70–99)
Glucose-Capillary: 480 mg/dL — ABNORMAL HIGH (ref 70–99)

## 2012-01-06 LAB — BASIC METABOLIC PANEL
CO2: 24 mEq/L (ref 19–32)
Chloride: 94 mEq/L — ABNORMAL LOW (ref 96–112)
Creatinine, Ser: 3.48 mg/dL — ABNORMAL HIGH (ref 0.50–1.35)

## 2012-01-06 MED ORDER — INSULIN GLARGINE 100 UNIT/ML ~~LOC~~ SOLN: 20.0000 [IU] | Freq: Every day | SUBCUTANEOUS | Status: DC

## 2012-01-06 MED ORDER — SODIUM CHLORIDE 0.9 % IV SOLN
INTRAVENOUS | Status: DC
  Administered 2012-01-06: 7 [IU]/h via INTRAVENOUS
  Administered 2012-01-06: 5.4 [IU]/h via INTRAVENOUS
  Filled 2012-01-06 (×3): qty 1

## 2012-01-06 MED ORDER — DEXTROSE 50 % IV SOLN: 25.0000 mL | INTRAVENOUS | Status: DC | PRN

## 2012-01-06 MED ORDER — INSULIN GLARGINE 100 UNIT/ML ~~LOC~~ SOLN
15.0000 [IU] | Freq: Every day | SUBCUTANEOUS | Status: DC
  Administered 2012-01-06: 15 [IU] via SUBCUTANEOUS
  Filled 2012-01-06: qty 3

## 2012-01-06 MED ORDER — HEPARIN SODIUM (PORCINE) 1000 UNIT/ML DIALYSIS
20.0000 [IU]/kg | INTRAMUSCULAR | Status: DC | PRN
  Filled 2012-01-06: qty 2

## 2012-01-06 MED ORDER — SODIUM CHLORIDE 0.9 % IV SOLN
INTRAVENOUS | Status: DC
  Administered 2012-01-06: 09:00:00 via INTRAVENOUS

## 2012-01-06 MED ORDER — LIDOCAINE HCL (PF) 1 % IJ SOLN
INTRAMUSCULAR | Status: AC
  Filled 2012-01-06: qty 5

## 2012-01-06 MED ORDER — INSULIN ASPART 100 UNIT/ML ~~LOC~~ SOLN
0.0000 [IU] | Freq: Three times a day (TID) | SUBCUTANEOUS | Status: DC
  Administered 2012-01-06: 3 [IU] via SUBCUTANEOUS

## 2012-01-06 MED ORDER — INSULIN ASPART 100 UNIT/ML ~~LOC~~ SOLN
0.0000 [IU] | Freq: Every day | SUBCUTANEOUS | Status: DC
Start: 2012-01-06 — End: 2012-01-07

## 2012-01-06 MED ORDER — INSULIN ASPART 100 UNIT/ML ~~LOC~~ SOLN
10.0000 [IU] | Freq: Once | SUBCUTANEOUS | Status: AC
  Administered 2012-01-06: 10 [IU] via SUBCUTANEOUS

## 2012-01-06 NOTE — Op Note (Signed)
Incision and Drainage Procedure Note  Pre-operative Diagnosis: (R)posterior neck abscess  Post-operative Diagnosis: same  Anesthesia: 1% plain lidocaine  Procedure Details  The procedure, risks and complications have been discussed in detail (including, but not limited to airway compromise, infection, bleeding) with the patient.  The skin was sterilely prepped and draped over the affected area in the usual fashion. After adequate local anesthesia, I&D with a #15 blade was performed on the right post neck region. A small amount of purulent and sebaceous drainage was expressed.  Septations were fractured with sterile hemostat. Culture not obtained. Sterile 1/4" iodoform packing placed and dry dressing. The patient was observed until stable.   Condition: Tolerated procedure well and Stable   Complications: none.  Marianna Fuss PA-C 01/06/2012 10:45 AM

## 2012-01-06 NOTE — Progress Notes (Signed)
Taylor [name] CSN:[id number],MRN:[id number] is a 41 y.o. male,  Outpatient Primary MD for the patient is [name], MD, MD  Chief Complaint  Patient presents with  . Hyperglycemia        Subjective:   Taylor [name] today has, No headache, No chest pain, No abdominal pain - No Nausea, No new weakness tingling or numbness, No Cough - SOB.   Objective:   Filed Vitals:   01/06/12 0335 01/06/12 0354 01/06/12 0552 01/06/12 0730  BP: 142/69   150/77  Pulse: 82   87  Temp:  98.3 F (36.8 C)  98.6 F (37 C)  TempSrc:  Oral  Oral  Resp: 22   24  Height:      Weight:   87.1 kg (192 lb 0.3 oz)   SpO2: 96%   98%    Wt Readings from Last 3 Encounters:  01/06/12 87.1 kg (192 lb 0.3 oz)  01/04/12 89.812 kg (198 lb)  11/23/11 90.039 kg (198 lb 8 oz)     Intake/Output Summary (Last 24 hours) at 01/06/12 0900 Last data filed at 01/06/12 0854  Gross per 24 hour  Intake    723 ml  Output   2850 ml  Net  -2127 ml    Exam Awake Alert, Oriented *3, No new F.N deficits, Normal affect [place].AT,PERRAL Supple Neck,No JVD, Rt posterior sebaceous cyst, No cervical lymphadenopathy appriciated.  Symmetrical Chest wall movement, Good air movement bilaterally, CTAB RRR,No Gallops,Rubs or new Murmurs, No Parasternal Heave +ve B.Sounds, Abd Soft, Non tender, No organomegaly appriciated, No rebound -guarding or rigidity. 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  Data Review  CBC  Lab 01/06/12 0558 01/05/12 0528 01/04/12 1340  WBC 6.3 6.5 8.6  HGB 9.9* 9.8* 11.1*  HCT 30.4* 29.0* 34.0*  PLT 126* 102* 120*  MCV 89.1 86.3 89.7  MCH 29.0 29.2 29.3  MCHC 32.6 33.8 32.6  RDW 14.5 13.9 13.8  LYMPHSABS -- -- --  MONOABS -- -- --  EOSABS -- -- --  BASOSABS -- -- --  BANDABS -- -- --    Chemistries   Lab 01/06/12 0558 01/05/12 0528 01/04/12 2250 01/04/12 1340  NA 131* 132* 127* 120*  K 4.3 3.2* 3.6 4.9  CL 94*  96 91* 82*  CO2 26 21 22 22   GLUCOSE 564* 142* 405* 844*  BUN 32* 58* 57* 54*  CREATININE 3.43* 4.91* 4.70* 4.51*  CALCIUM 8.8 9.6 9.9 9.9  MG -- 2.2 -- --  AST -- -- -- 20  ALT -- -- -- 25  ALKPHOS -- -- -- 234*  BILITOT -- -- -- 0.7   ------------------------------------------------------------------------------------------------------------------ estimated creatinine clearance is 33.5 ml/min (by C-G formula based on Cr of 3.43). ------------------------------------------------------------------------------------------------------------------  [facility] 01/04/12 1913  HGBA1C 15.5*   ------------------------------------------------------------------------------------------------------------------ No results found for this basename: CHOL:2,HDL:2,LDLCALC:2,TRIG:2,CHOLHDL:2,LDLDIRECT:2 in the last 72 hours ------------------------------------------------------------------------------------------------------------------ No results found for this basename: TSH,T4TOTAL,FREET3,T3FREE,THYROIDAB in the last 72 hours ------------------------------------------------------------------------------------------------------------------ No results found for this basename: VITAMINB12:2,FOLATE:2,FERRITIN:2,TIBC:2,IRON:2,RETICCTPCT:2 in the last 72 hours  Coagulation profile No results found for this basename: INR:5,PROTIME:5 in the last 168 hours  No results found for this basename: DDIMER:2 in the last 72 hours  Cardiac Enzymes No results found for this basename: CK:3,CKMB:3,TROPONINI:3,MYOGLOBIN:3 in the last 168 hours ------------------------------------------------------------------------------------------------------------------ No components found with this basename: POCBNP:3  Micro Results Recent Results (from the past 240 hour(s))  MRSA PCR SCREENING     Status: Normal  Collection Time   01/04/12 10:36 PM      Component Value Range Status Comment   MRSA by PCR NEGATIVE  NEGATIVE  Final      Radiology Reports Dg Chest 2 View  01/04/2012  *RADIOLOGY REPORT*  Clinical Data: Cough  CHEST - 2 VIEW  Comparison: 04/27/2011  Findings: Previous CABG.  Mild cardiomegaly.  Mild interstitial edema.  Small bilateral pleural effusions.  IMPRESSION:  1. Mild cardiomegaly,   mild interstitial edema and tiny pleural effusions.  Original Report Authenticated By: Osa Craver, M.D.   US Soft Tissue Head/neck  01/05/2012  *RADIOLOGY REPORT*  Clinical Data: Neck abscess.  Redness and swelling.  ULTRASOUND OF HEAD/NECK SOFT TISSUES  Technique:  Ultrasound examination of the head and neck soft tissues was performed in the area of clinical concern.  Comparison:  None.  Findings: There is a 10 x 28 x 29 mm complex subcutaneous collection in the posterior right cervical region in the area of clinical concern.  IMPRESSION:  1.  Small residual subcutaneous collection in the right posterior cervical region.  Original Report Authenticated By: Thora Lance III, M.D.    Scheduled Meds:   . aspirin  325 mg Oral Daily  . atorvastatin  40 mg Oral q1800  . carvedilol  25 mg Oral BID WC  . cinacalcet  60 mg Oral QAC breakfast  . collagenase   Topical Daily  . feeding supplement  30 mL Oral BID  . heparin  5,000 Units Subcutaneous Q8H  . insulin aspart  4 Units Subcutaneous Once  . insulin glargine  15 Units Subcutaneous Daily  . isosorbide mononitrate  30 mg Oral BID  . levothyroxine  50 mcg Oral QAC breakfast  . lisinopril  20 mg Oral Daily  . living well with diabetes book   Does not apply Once  . omega-3 acid ethyl esters  1 g Oral Daily  . pantoprazole  80 mg Oral Q1200  . pregabalin  150 mg Oral BID  . sodium chloride  3 mL Intravenous Q12H  . DISCONTD: insulin aspart  0-9 Units Subcutaneous TID WC  . DISCONTD: insulin glargine  8 Units Subcutaneous Daily  . DISCONTD: mupirocin ointment  1 application Topical Daily   Continuous Infusions:   . sodium chloride 50 mL/hr at 01/06/12  0851  . insulin (NOVOLIN-R) infusion 5.4 Units/hr (01/06/12 0852)  . 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 Likely running high from the Neck Cyst / Abscess general surgery for I&D today.   HYPOTHYROIDISM (01/26/2011) Stable currently. Pt on synthroid.    DIABETES MELLITUS, TYPE I (01/26/2011) A1c>15, as #1.   End stage renal disease (01/26/2011) Nephro on board and managing HD    HTN (hypertension) (02/11/2011) On current regimen has fluctuated from normotensive to 158/75 will continue to monitor.    Type 1 diabetes mellitus with diabetic foot ulcer (01/04/2012) Wound care consult has been placed.    Diabetic peripheral neuropathy (01/04/2012) Stable currently.    Hyponatremia (01/04/2012) Improving lately. Will recheck sodium levels in the AM. Fluids have been stopped    DVT Prophylaxis   Heparin    See all Orders  from today for further details     Leroy Sea M.D on 01/06/2012 at 9:00 AM  [facility] Office  [phone]

## 2012-01-06 NOTE — Progress Notes (Signed)
CRITICAL VALUE ALERT  Critical value received:  Glucose- 564  Date of notification: 01-06-12   Time of notification: 0721   Critical value read back:yes  Nurse who received alert:  Delice Bison  MD notified (1st page):  Dr. Thedore Mins  Time of first page:  0730  MD notified (2nd page):  Time of second page:  Responding MD:  Dr. Thedore Mins  Time MD responded:  [phone]

## 2012-01-06 NOTE — Progress Notes (Signed)
CRITICAL VALUE ALERT  Critical value received:  Glucose- 595  Date of notification:  01-06-12  Time of notification:  0943  Critical value read back:yes  Nurse who received alert:  Delice Bison  MD notified (1st page):  Dr. Thedore Mins  Time of first page:  0945  MD notified (2nd page):  Time of second page:  Responding MD:  Dr. Thedore Mins  Time MD responded:  [phone]

## 2012-01-06 NOTE — Progress Notes (Addendum)
Inpatient Diabetes Program Recommendations  AACE/ADA: New Consensus Statement on Inpatient Glycemic Control (2009)  Target Ranges:  Prepandial:   less than 140 mg/dL      Peak postprandial:   less than 180 mg/dL (1-2 hours)      Critically ill patients:  140 - 180 mg/dL   Reason for Visit: Patient back on insulin drip this AM.  CBG now down to 144 mg/dL.  He received Lantus 15 units at 10 am.  Note patient has had diabetes type 1 since age 41 and now on dialysis.  He has only been taking Lantus at home due to fear and hypoglycemia unawareness.  He states he rarely checks CBG's and will occasionally check when he feels bad.  He is interested in follow up with endocrinologist after hospitalization.  It appears he would benefit from Novolog meal coverage 3 units tid with meals.  Also consider reducing correction to sensitive and give if greater than 150 mg/dL and check CBG's q 4 hours for the next 24 hours.    Note: Will follow closely.  Discussed with MD.  He states to have the RN call MD if CBG's greater than 200 mg/dL.

## 2012-01-06 NOTE — Progress Notes (Addendum)
Patient ID: Taylor [name], male   DOB: 08-04-1971, 41 y.o.   MRN: [id number] S:no complaints   O: BP 150/77  Pulse 87  Temp(Src) 98.6 F (37 C) (Oral)  Resp 24  Ht 6\' 3"  (1.905 m)  Wt 87.1 kg (192 lb 0.3 oz)  BMI 24.00 kg/m2  SpO2 98%  General appearance: alert, cooperative and no distress  Head: Normocephalic, without obvious abnormality, atraumatic  Neck: no carotid bruit, no JVD, supple, symmetrical, trachea midline, thyroid not enlarged, symmetric, no tenderness/mass/nodules and mass at right posterior auricular/occipital area, firm nonfluctuant, larger than yesterday Resp: CTA Cardio: regular rate and rhythm, S1, S2 normal, no murmur, click, rub or gallop  GI: soft, non-tender; bowel sounds normal; no masses, no organomegaly  Extremities: edema 1+ and 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 ulcerated lesion at base of right 1st metatarsal, no fluctuance     . aspirin  325 mg Oral Daily  . atorvastatin  40 mg Oral q1800  . carvedilol  25 mg Oral BID WC  . cinacalcet  60 mg Oral QAC breakfast  . collagenase   Topical Daily  . feeding supplement  30 mL Oral BID  . heparin  5,000 Units Subcutaneous Q8H  . insulin aspart  4 Units Subcutaneous Once  . insulin glargine  15 Units Subcutaneous Daily  . isosorbide mononitrate  30 mg Oral BID  . levothyroxine  50 mcg Oral QAC breakfast  . lisinopril  20 mg Oral Daily  . living well with diabetes book   Does not apply Once  . omega-3 acid ethyl esters  1 g Oral Daily  . pantoprazole  80 mg Oral Q1200  . pregabalin  150 mg Oral BID  . sodium chloride  3 mL Intravenous Q12H  . DISCONTD: insulin aspart  0-9 Units Subcutaneous TID WC  . DISCONTD: insulin glargine  8 Units Subcutaneous Daily    BMET  Lab 01/06/12 0807 01/06/12 0558 01/05/12 0528 01/04/12 2250 01/04/12 1340  NA 130* 131* 132* 127* 120*  K 4.5 4.3 3.2* 3.6 4.9  CL 94* 94* 96 91* 82*  CO2 24 26 21 22 22   GLUCOSE  595* 564* 142* 405* 844*  BUN 34* 32* 58* 57* 54*  CREATININE 3.48* 3.43* 4.91* 4.70* 4.51*  ALB -- -- -- -- --  CALCIUM 8.9 8.8 9.6 9.9 9.9  PHOS -- 2.9 4.3 -- --   CBC  Lab 01/06/12 0807 01/06/12 0558 01/05/12 0528 01/04/12 1340  WBC 6.5 6.3 6.5 8.6  NEUTROABS -- -- -- --  HGB 9.8* 9.9* 9.8* 11.1*  HCT 30.0* 30.4* 29.0* 34.0*  MCV 90.4 89.1 86.3 89.7  PLT 130* 126* 102* 120*    @IMGRELPRIORS @  Assessment/Plan: 1.Hyperosmolar, non-ketotic state- still with CBG>500, back on insulin drip per primary team 2.ESRD- cont with HD qMWF 3. Anemia:cont with epo 4. Cyst- for I&D per [facility] 5. Nutrition:cont with prot supplementation 6. Hypertension:stable 7. dispo- per primary svc Jaiyah Beining A  Clarify HD qTTS no MWF

## 2012-01-06 NOTE — Progress Notes (Signed)
Patient ID: Taylor [name], male   DOB: August 17, 1971, 41 y.o.   MRN: [id number]  General Surgery - [facility] - Progress Note  Subjective: Patient seen with medical service.  Glucose still out of control.  No pain from mass right occiput.  Previous attempt at drainage by surgeon in [place].  Objective: Vital signs in last 24 hours: Temp:  [97.6 F (36.4 C)-98.7 F (37.1 C)] 98.6 F (37 C) (02/27 0730) Pulse Rate:  [75-87] 87  (02/27 0730) Resp:  [18-26] 24  (02/27 0730) BP: (128-158)/(59-77) 150/77 mmHg (02/27 0730) SpO2:  [94 %-98 %] 98 % (02/27 0730) Weight:  [190 lb 11.2 oz (86.5 kg)-195 lb 5.2 oz (88.6 kg)] 192 lb 0.3 oz (87.1 kg) (02/27 0552) Last BM Date: 01/03/12  Intake/Output from previous day: 02/26 0701 - 02/27 0700 In: 343 [P.O.:240; I.V.:103] Out: 2850 [Urine:850]  Exam: HEENT - clear, not icteric Neck - 4 cm ? fluctuent mass right occiput; no cellulitis; small sinus tract; no drainage; minimally tender Chest - clear bilaterally Cor - RRR, no murmur Neuro - grossly intact, no focal deficits  Lab Results:   Basename 01/06/12 0558 01/05/12 0528  WBC 6.3 6.5  HGB 9.9* 9.8*  HCT 30.4* 29.0*  PLT 126* 102*     Basename 01/06/12 0558 01/05/12 0528  NA 131* 132*  K 4.3 3.2*  CL 94* 96  CO2 26 21  GLUCOSE 564* 142*  BUN 32* 58*  CREATININE 3.43* 4.91*  CALCIUM 8.8 9.6    Studies/Results: Dg Chest 2 View  01/04/2012  *RADIOLOGY REPORT*  Clinical Data: Cough  CHEST - 2 VIEW  Comparison: 04/27/2011  Findings: Previous CABG.  Mild cardiomegaly.  Mild interstitial edema.  Small bilateral pleural effusions.  IMPRESSION:  1. Mild cardiomegaly,   mild interstitial edema and tiny pleural effusions.  Original Report Authenticated By: Osa Craver, M.D.   US Soft Tissue Head/neck  01/05/2012  *RADIOLOGY REPORT*  Clinical Data: Neck abscess.  Redness and swelling.  ULTRASOUND OF HEAD/NECK SOFT TISSUES  Technique:  Ultrasound  examination of the head and neck soft tissues was performed in the area of clinical concern.  Comparison:  None.  Findings: There is a 10 x 28 x 29 mm complex subcutaneous collection in the posterior right cervical region in the area of clinical concern.  IMPRESSION:  1.  Small residual subcutaneous collection in the right posterior cervical region.  Original Report Authenticated By: Osa Craver, M.D.    Assessment: Sebaceous cyst, right posterior neck, with mild inflammation  Plan: Will incise and drain and pack open today Supplies ordered to bedside Discussed with medical service   Velora Heckler, MD, [facility] Office: [phone]  01/06/2012

## 2012-01-07 ENCOUNTER — Inpatient Hospital Stay (HOSPITAL_COMMUNITY): Payer: Medicare Other

## 2012-01-07 LAB — BASIC METABOLIC PANEL
BUN: 45 mg/dL — ABNORMAL HIGH (ref 6–23)
Calcium: 9.2 mg/dL (ref 8.4–10.5)
Creatinine, Ser: 4.45 mg/dL — ABNORMAL HIGH (ref 0.50–1.35)
GFR calc Af Amer: 17 mL/min — ABNORMAL LOW (ref >90)
GFR calc non Af Amer: 15 mL/min — ABNORMAL LOW (ref >90)
Glucose, Bld: 412 mg/dL — ABNORMAL HIGH (ref 70–99)

## 2012-01-07 LAB — CBC
HCT: 29.5 % — ABNORMAL LOW (ref 39.0–52.0)
Hemoglobin: 9.6 g/dL — ABNORMAL LOW (ref 13.0–17.0)
MCH: 29.4 pg (ref 26.0–34.0)
MCHC: 32.5 g/dL (ref 30.0–36.0)
MCV: 90.5 fL (ref 78.0–100.0)
RDW: 14.5 % (ref 11.5–15.5)

## 2012-01-07 LAB — GLUCOSE, CAPILLARY: Glucose-Capillary: 290 mg/dL — ABNORMAL HIGH (ref 70–99)

## 2012-01-07 MED ORDER — INSULIN ASPART 100 UNIT/ML ~~LOC~~ SOLN
0.0000 [IU] | SUBCUTANEOUS | Status: DC
  Administered 2012-01-07: 20 [IU] via SUBCUTANEOUS

## 2012-01-07 MED ORDER — INSULIN ASPART 100 UNIT/ML ~~LOC~~ SOLN: 0.0000 [IU] | Freq: Three times a day (TID) | SUBCUTANEOUS | Status: DC

## 2012-01-07 MED ORDER — INSULIN ASPART 100 UNIT/ML ~~LOC~~ SOLN
0.0000 [IU] | Freq: Three times a day (TID) | SUBCUTANEOUS | Status: DC
  Administered 2012-01-08: 15 [IU] via SUBCUTANEOUS
  Administered 2012-01-08: 8 [IU] via SUBCUTANEOUS
  Administered 2012-01-08: 3 [IU] via SUBCUTANEOUS
  Filled 2012-01-07: qty 3

## 2012-01-07 MED ORDER — INSULIN ASPART 100 UNIT/ML ~~LOC~~ SOLN
0.0000 [IU] | Freq: Every day | SUBCUTANEOUS | Status: DC
  Filled 2012-01-07: qty 3

## 2012-01-07 MED ORDER — INSULIN ASPART 100 UNIT/ML ~~LOC~~ SOLN
3.0000 [IU] | Freq: Three times a day (TID) | SUBCUTANEOUS | Status: DC
  Administered 2012-01-07: 3 [IU] via SUBCUTANEOUS

## 2012-01-07 MED ORDER — INSULIN GLARGINE 100 UNIT/ML ~~LOC~~ SOLN
20.0000 [IU] | Freq: Every day | SUBCUTANEOUS | Status: DC
  Administered 2012-01-07: 20 [IU] via SUBCUTANEOUS

## 2012-01-07 MED ORDER — INSULIN ASPART 100 UNIT/ML ~~LOC~~ SOLN
0.0000 [IU] | Freq: Every day | SUBCUTANEOUS | Status: DC
  Administered 2012-01-07: 5 [IU] via SUBCUTANEOUS
  Administered 2012-01-08: 3 [IU] via SUBCUTANEOUS

## 2012-01-07 NOTE — Progress Notes (Signed)
Patient ID: Taylor [name], male   DOB: 1971-06-05, 41 y.o.   MRN: [id number] S:Pt feels well   O: BP 139/70  Pulse 80  Temp(Src) 98.1 F (36.7 C) (Oral)  Resp 17  Ht 6\' 3"  (1.905 m)  Wt 87.1 kg (192 lb 0.3 oz)  BMI 24.00 kg/m2  SpO2 97%  Gen:WD WN WM in NAD CVS:RRR Resp:CTA WUJ:WJXBJY Ext:no edema,       . aspirin  325 mg Oral Daily  . atorvastatin  40 mg Oral q1800  . carvedilol  25 mg Oral BID WC  . cinacalcet  60 mg Oral QAC breakfast  . collagenase   Topical Daily  . feeding supplement  30 mL Oral BID  . heparin  5,000 Units Subcutaneous Q8H  . insulin aspart  0-20 Units Subcutaneous Q2H  . insulin aspart  0-5 Units Subcutaneous QHS  . insulin aspart  10 Units Subcutaneous Once  . insulin aspart  3 Units Subcutaneous TID WC  . insulin glargine  20 Units Subcutaneous Daily  . isosorbide mononitrate  30 mg Oral BID  . levothyroxine  50 mcg Oral QAC breakfast  . lidocaine      . lisinopril  20 mg Oral Daily  . omega-3 acid ethyl esters  1 g Oral Daily  . pantoprazole  80 mg Oral Q1200  . pregabalin  150 mg Oral BID  . sodium chloride  3 mL Intravenous Q12H  . DISCONTD: insulin aspart  0-15 Units Subcutaneous TID WC  . DISCONTD: insulin aspart  0-20 Units Subcutaneous TID WC  . DISCONTD: insulin aspart  0-5 Units Subcutaneous QHS  . DISCONTD: insulin glargine  15 Units Subcutaneous Daily  . DISCONTD: insulin glargine  20 Units Subcutaneous Daily    BMET  Lab 01/07/12 0520 01/06/12 0807 01/06/12 0558 01/05/12 0528 01/04/12 2250 01/04/12 1340  NA 130* 130* 131* 132* 127* 120*  K 4.5 4.5 4.3 3.2* 3.6 4.9  CL 94* 94* 94* 96 91* 82*  CO2 25 24 26 21 22 22   GLUCOSE 412* 595* 564* 142* 405* 844*  BUN 45* 34* 32* 58* 57* 54*  CREATININE 4.45* 3.48* 3.43* 4.91* 4.70* 4.51*  ALB -- -- -- -- -- --  CALCIUM 9.2 8.9 8.8 9.6 9.9 9.9  PHOS -- -- 2.9 4.3 -- --   CBC  Lab 01/07/12 0520 01/06/12 0807 01/06/12 0558 01/05/12 0528  WBC 4.9 6.5 6.3 6.5  NEUTROABS  -- -- -- --  HGB 9.6* 9.8* 9.9* 9.8*  HCT 29.5* 30.0* 30.4* 29.0*  MCV 90.5 90.4 89.1 86.3  PLT 120* 130* 126* 102*    @IMGRELPRIORS @  Assessment/Plan: 1.Hyperglycemia- will change SSI due to HD today which will significantly lower his CBG and want to prevent an hypoglycemic event.  Will also continue with meal coverage which was missed yesterday and likely is etiology of jump in CBG.  Agree with increasing lantus 2.ESRD cont with HD qMWF 3. Anemia:on epo 4. Diabetic ulcer- per wound care 5. Nutrition:cont with prot supplementation 6. Hypertension:stable 7. dispo- hopeful d/c tomorrow if CBG's remain controlled but his Hgb A1C was 15% and likely has CBG's in 500's at home  Toini Failla A

## 2012-01-07 NOTE — Progress Notes (Signed)
Inpatient Diabetes Program Recommendations  AACE/ADA: New Consensus Statement on Inpatient Glycemic Control (2009)  Target Ranges:  Prepandial:   less than 140 mg/dL      Peak postprandial:   less than 180 mg/dL (1-2 hours)      Critically ill patients:  140 - 180 mg/dL   Reason for Visit: Note CBG back up to 491 mg/dL this morning. Agree with increased Lantus and addition of meal coverage.  Reduce Novolog to sensitive q 4-6 hours as patient is very sensitive to insulin.  Called and discussed with Dr. Thedore Mins.  Pt received 23 units of Novolog this AM for CBG of 491, therefore need to monitor very closely for hypoglycemia.  Discussed with RN.

## 2012-01-07 NOTE — Progress Notes (Signed)
Patient ID: Taylor [name], male   DOB: [birth year], 41 y.o.   MRN: [id number]  General Surgery - [facility] - Progress Note  Subjective: Patient without complaint.  No pain from right neck wound.  Objective: Vital signs in last 24 hours: Temp:  [97.7 F (36.5 C)-98.3 F (36.8 C)] 98.1 F (36.7 C) (02/28 0846) Pulse Rate:  [80] 80  (02/27 1553) Resp:  [17-24] 17  (02/28 0843) BP: (119-159)/(53-71) 139/70 mmHg (02/28 0846) SpO2:  [94 %-98 %] 97 % (02/28 0846) Last BM Date: 01/06/12  Intake/Output from previous day: 02/27 0701 - 02/28 0700 In: 1376 [P.O.:960; I.V.:416] Out: 1150 [Urine:1150]  Exam: HEENT - clear, not icteric Neck - dry dressing right posterior neck with small light brown drainage  Lab Results:   Basename 01/07/12 0520 01/06/12 0807  WBC 4.9 6.5  HGB 9.6* 9.8*  HCT 29.5* 30.0*  PLT 120* 130*     Basename 01/07/12 0520 01/06/12 0807  NA 130* 130*  K 4.5 4.5  CL 94* 94*  CO2 25 24  GLUCOSE 412* 595*  BUN 45* 34*  CREATININE 4.45* 3.48*  CALCIUM 9.2 8.9    Studies/Results: US Soft Tissue Head/neck  01/05/2012  *RADIOLOGY REPORT*  Clinical Data: Neck abscess.  Redness and swelling.  ULTRASOUND OF HEAD/NECK SOFT TISSUES  Technique:  Ultrasound examination of the head and neck soft tissues was performed in the area of clinical concern.  Comparison:  None.  Findings: There is a 10 x 28 x 29 mm complex subcutaneous collection in the posterior right cervical region in the area of clinical concern.  IMPRESSION:  1.  Small residual subcutaneous collection in the right posterior cervical region.  Original Report Authenticated By: Osa Craver, M.D.    Assessment: Right posterior neck sebaceous cyst with abscess  Plan: Right neck abscess drained Remove packing in 48 hrs - Saturday 3/2 - by soaking with warm water and pull pack out completely Cover wound with dry gauze until all drainage stops Schedule follow up in [facility]  office 3 weeks - will require out-patient excision of sebaceous cyst once medically stable and infection resolved. Will sign off - call if needed.  Velora Heckler, MD, [facility] Office: [phone]  01/07/2012

## 2012-01-07 NOTE — Progress Notes (Signed)
CRITICAL VALUE ALERT  Critical value received:  CBG 491  Date of notification:  28 Feb 13  Time of notification:  0810  Critical value read back:yes   Nurse who received alert: S. Tavari Loadholt  MD notified (1st page):  Dr Thedore Mins  Time of first page:    MD notified (2nd page):  Time of second page:  Responding MD: Dr Thedore Mins  Time MD responded:

## 2012-01-07 NOTE — Progress Notes (Signed)
Taylor [name] CSN:[id number],MRN:[id number] is a 41 y.o. male,  Outpatient Primary MD for the patient is [name], MD, MD  Chief Complaint  Patient presents with  . Hyperglycemia        Subjective:    Taylor [name] today has, No headache, No chest pain, No abdominal pain - No Nausea, No new weakness tingling or numbness, No Cough - SOB.     Objective:   Filed Vitals:   01/06/12 1553 01/06/12 2000 01/07/12 0000 01/07/12 0400  BP: 119/60 135/64 141/71 129/61  Pulse: 80     Temp: 98.2 F (36.8 C) 98 F (36.7 C) 98 F (36.7 C) 97.7 F (36.5 C)  TempSrc: Oral Oral Oral Oral  Resp: 24 22 20 18   Height:      Weight:      SpO2: 96% 98% 95% 94%    Wt Readings from Last 3 Encounters:  01/06/12 87.1 kg (192 lb 0.3 oz)  01/04/12 89.812 kg (198 lb)  11/23/11 90.039 kg (198 lb 8 oz)     Intake/Output Summary (Last 24 hours) at 01/07/12 0758 Last data filed at 01/07/12 0000  Gross per 24 hour  Intake   1376 ml  Output   1150 ml  Net    226 ml    Exam  Awake Alert, Oriented *3, No new F.N deficits, Normal affect [place].AT,PERRAL Supple Neck,No JVD, Rt posterior sebaceous cyst, No cervical lymphadenopathy appriciated.  Symmetrical Chest wall movement, Good air movement bilaterally, CTAB RRR,No Gallops,Rubs or new Murmurs, No Parasternal Heave +ve B.Sounds, Abd Soft, Non tender, No organomegaly appriciated, No rebound -guarding or rigidity. 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   Data Review   CBC  Lab 01/07/12 0520 01/06/12 0807 01/06/12 0558 01/05/12 0528 01/04/12 1340  WBC 4.9 6.5 6.3 6.5 8.6  HGB 9.6* 9.8* 9.9* 9.8* 11.1*  HCT 29.5* 30.0* 30.4* 29.0* 34.0*  PLT 120* 130* 126* 102* 120*  MCV 90.5 90.4 89.1 86.3 89.7  MCH 29.4 29.5 29.0 29.2 29.3  MCHC 32.5 32.7 32.6 33.8 32.6  RDW 14.5 14.4 14.5 13.9 13.8  LYMPHSABS -- -- -- -- --  MONOABS -- -- -- -- --  EOSABS -- -- -- -- --    BASOSABS -- -- -- -- --  BANDABS -- -- -- -- --    Chemistries   Lab 01/07/12 0520 01/06/12 0807 01/06/12 0558 01/05/12 0528 01/04/12 2250 01/04/12 1340  NA 130* 130* 131* 132* 127* --  K 4.5 4.5 4.3 3.2* 3.6 --  CL 94* 94* 94* 96 91* --  CO2 25 24 26 21 22  --  GLUCOSE 412* 595* 564* 142* 405* --  BUN 45* 34* 32* 58* 57* --  CREATININE 4.45* 3.48* 3.43* 4.91* 4.70* --  CALCIUM 9.2 8.9 8.8 9.6 9.9 --  MG -- -- -- 2.2 -- --  AST -- -- -- -- -- 20  ALT -- -- -- -- -- 25  ALKPHOS -- -- -- -- -- 234*  BILITOT -- -- -- -- -- 0.7   ------------------------------------------------------------------------------------------------------------------ estimated creatinine clearance is 25.8 ml/min (by C-G formula based on Cr of 4.45). ------------------------------------------------------------------------------------------------------------------   [facility] 01/04/12 1913  HGBA1C 15.5*    ------------------------------------------------------------------------------------------------------------------ No results found for this basename: CHOL:2,HDL:2,LDLCALC:2,TRIG:2,CHOLHDL:2,LDLDIRECT:2 in the last 72 hours ------------------------------------------------------------------------------------------------------------------ No results found for this basename: TSH,T4TOTAL,FREET3,T3FREE,THYROIDAB in the last 72 hours ------------------------------------------------------------------------------------------------------------------ No results found for this basename: VITAMINB12:2,FOLATE:2,FERRITIN:2,TIBC:2,IRON:2,RETICCTPCT:2 in the last 72 hours  Coagulation profile No results found for this basename: INR:5,PROTIME:5 in the last 168 hours  No results found for this basename: DDIMER:2 in the last 72 hours  Cardiac Enzymes No results found for this basename: CK:3,CKMB:3,TROPONINI:3,MYOGLOBIN:3 in the last 168  hours ------------------------------------------------------------------------------------------------------------------ No components found with this basename: POCBNP:3  Micro Results Recent Results (from the past 240 hour(s))  MRSA PCR SCREENING     Status: Normal   Collection Time   01/04/12 10:36 PM      Component Value Range Status Comment   MRSA by PCR NEGATIVE  NEGATIVE  Final     Radiology Reports Dg Chest 2 View  01/04/2012  *RADIOLOGY REPORT*  Clinical Data: Cough  CHEST - 2 VIEW  Comparison: 04/27/2011  Findings: Previous CABG.  Mild cardiomegaly.  Mild interstitial edema.  Small bilateral pleural effusions.  IMPRESSION:  1. Mild cardiomegaly,   mild interstitial edema and tiny pleural effusions.  Original Report Authenticated By: Osa Craver, M.D.   US Soft Tissue Head/neck  01/05/2012  *RADIOLOGY REPORT*  Clinical Data: Neck abscess.  Redness and swelling.  ULTRASOUND OF HEAD/NECK SOFT TISSUES  Technique:  Ultrasound examination of the head and neck soft tissues was performed in the area of clinical concern.  Comparison:  None.  Findings: There is a 10 x 28 x 29 mm complex subcutaneous collection in the posterior right cervical region in the area of clinical concern.  IMPRESSION:  1.  Small residual subcutaneous collection in the right posterior cervical region.  Original Report Authenticated By: Thora Lance III, M.D.    Scheduled Meds:    . aspirin  325 mg Oral Daily  . atorvastatin  40 mg Oral q1800  . carvedilol  25 mg Oral BID WC  . cinacalcet  60 mg Oral QAC breakfast  . collagenase   Topical Daily  . feeding supplement  30 mL Oral BID  . heparin  5,000 Units Subcutaneous Q8H  . insulin aspart  0-20 Units Subcutaneous Q2H  . insulin aspart  0-5 Units Subcutaneous QHS  . insulin aspart  10 Units Subcutaneous Once  . insulin aspart  3 Units Subcutaneous TID WC  . insulin glargine  20 Units Subcutaneous Daily  . isosorbide mononitrate  30 mg Oral BID   . levothyroxine  50 mcg Oral QAC breakfast  . lidocaine      . lisinopril  20 mg Oral Daily  . omega-3 acid ethyl esters  1 g Oral Daily  . pantoprazole  80 mg Oral Q1200  . pregabalin  150 mg Oral BID  . sodium chloride  3 mL Intravenous Q12H  . DISCONTD: insulin aspart  0-15 Units Subcutaneous TID WC  . DISCONTD: insulin aspart  0-20 Units Subcutaneous TID WC  . DISCONTD: insulin aspart  0-5 Units Subcutaneous QHS  . DISCONTD: insulin glargine  15 Units Subcutaneous Daily  . DISCONTD: insulin glargine  20 Units Subcutaneous Daily   Continuous Infusions:    . DISCONTD: sodium chloride Stopped (01/06/12 1507)  . DISCONTD: insulin (NOVOLIN-R) infusion Stopped (01/06/12 1507)   PRN Meds:.acetaminophen, acetaminophen, albuterol, ALPRAZolam, dextrose, feeding supplement (NEPRO CARB STEADY), heparin, heparin, heparin, lidocaine, lidocaine-prilocaine, ondansetron (ZOFRAN) IV, ondansetron, pentafluoroprop-tetrafluoroeth, DISCONTD: dextrose  Assessment & Plan    Diabetic hyperosmolar non-ketotic state (01/04/2012) Sugars high but better, no gap, stopped Insulin drip, have increased home dose Lantus & ISS, cover accuchecks and ISS every 2 hrs still sugars <200,  Likely running high from the Neck Cyst / Abscess general surgery  post I&D on 27th feb.   HYPOTHYROIDISM (01/26/2011) Stable currently. Pt on synthroid.    DIABETES MELLITUS, TYPE I (01/26/2011) A1c>15, as #1.   End stage renal disease (01/26/2011) Nephro on board and managing HD , T,Th Sat.   HTN (hypertension) (02/11/2011) On current regimen has fluctuated from normotensive to 158/75 will continue to monitor.    Type 1 diabetes mellitus with diabetic foot ulcer (01/04/2012) Wound care consult has been placed.    Diabetic peripheral neuropathy (01/04/2012) Stable currently.    Hyponatremia (01/04/2012) Improving lately. Will recheck sodium levels in the AM. Fluids have been stopped.    DVT Prophylaxis   Heparin     See all Orders from today for further details     Leroy Sea M.D on 01/07/2012 at 7:58 AM  [facility] Office  [phone]

## 2012-01-08 LAB — BASIC METABOLIC PANEL
BUN: 26 mg/dL — ABNORMAL HIGH (ref 6–23)
CO2: 29 mEq/L (ref 19–32)
Calcium: 8.7 mg/dL (ref 8.4–10.5)
Chloride: 96 mEq/L (ref 96–112)
Creatinine, Ser: 3.54 mg/dL — ABNORMAL HIGH (ref 0.50–1.35)

## 2012-01-08 LAB — GLUCOSE, CAPILLARY
Glucose-Capillary: 375 mg/dL — ABNORMAL HIGH (ref 70–99)
Glucose-Capillary: 291 mg/dL — ABNORMAL HIGH (ref 70–99)
Glucose-Capillary: 294 mg/dL — ABNORMAL HIGH (ref 70–99)

## 2012-01-08 LAB — CBC
HCT: 30.4 % — ABNORMAL LOW (ref 39.0–52.0)
MCH: 29 pg (ref 26.0–34.0)
MCV: 89.9 fL (ref 78.0–100.0)
RBC: 3.38 MIL/uL — ABNORMAL LOW (ref 4.22–5.81)
RDW: 14.4 % (ref 11.5–15.5)
WBC: 5 10*3/uL (ref 4.0–10.5)

## 2012-01-08 MED ORDER — HEPARIN SODIUM (PORCINE) 1000 UNIT/ML DIALYSIS
100.0000 [IU]/kg | INTRAMUSCULAR | Status: DC | PRN
  Filled 2012-01-08: qty 9

## 2012-01-08 MED ORDER — INSULIN ASPART 100 UNIT/ML ~~LOC~~ SOLN
5.0000 [IU] | Freq: Three times a day (TID) | SUBCUTANEOUS | Status: DC
Start: 2012-01-08 — End: 2012-01-09
  Administered 2012-01-08 (×3): 5 [IU] via SUBCUTANEOUS

## 2012-01-08 MED ORDER — INSULIN GLARGINE 100 UNIT/ML ~~LOC~~ SOLN
12.0000 [IU] | Freq: Two times a day (BID) | SUBCUTANEOUS | Status: DC
  Administered 2012-01-08 (×2): 12 [IU] via SUBCUTANEOUS
  Filled 2012-01-08: qty 3

## 2012-01-08 NOTE — Progress Notes (Signed)
Taylor [name] CSN:[id number],[id number] is a 41 y.o. male,  Outpatient Primary MD for the patient is [name], MD, MD  Chief Complaint  Patient presents with  . Hyperglycemia        Subjective:    Taylor [name] today has, No headache, No chest pain, No abdominal pain - No Nausea, No new weakness tingling or numbness, No Cough - SOB.     Objective:   Filed Vitals:   01/08/12 0000 01/08/12 0351 01/08/12 0400 01/08/12 0805  BP: 118/47  135/72 157/79  Pulse:      Temp: 98.1 F (36.7 C) 97.7 F (36.5 C)  97.7 F (36.5 C)  TempSrc: Oral Oral  Oral  Resp: 18  19 15   Height:      Weight:      SpO2: 98% 97%      Wt Readings from Last 3 Encounters:  01/07/12 85.4 kg (188 lb 4.4 oz)  01/04/12 89.812 kg (198 lb)  11/23/11 90.039 kg (198 lb 8 oz)     Intake/Output Summary (Last 24 hours) at 01/08/12 0908 Last data filed at 01/08/12 0851  Gross per 24 hour  Intake    840 ml  Output   2715 ml  Net  -1875 ml    Exam  Awake Alert, Oriented *3, No new F.N deficits, Normal affect [name],PERRAL Supple Neck,No JVD, Rt posterior sebaceous cyst, No cervical lymphadenopathy appriciated.  Symmetrical Chest wall movement, Good air movement bilaterally, CTAB RRR,No Gallops,Rubs or new Murmurs, No Parasternal Heave +ve B.Sounds, Abd Soft, Non tender, No organomegaly appriciated, No rebound -guarding or rigidity. 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   Data Review   CBC  Lab 01/08/12 0545 01/07/12 0520 01/06/12 0807 01/06/12 0558 01/05/12 0528  WBC 5.0 4.9 6.5 6.3 6.5  HGB 9.8* 9.6* 9.8* 9.9* 9.8*  HCT 30.4* 29.5* 30.0* 30.4* 29.0*  PLT 138* 120* 130* 126* 102*  MCV 89.9 90.5 90.4 89.1 86.3  MCH 29.0 29.4 29.5 29.0 29.2  MCHC 32.2 32.5 32.7 32.6 33.8  RDW 14.4 14.5 14.4 14.5 13.9  LYMPHSABS -- -- -- -- --  MONOABS -- -- -- -- --  EOSABS -- -- -- -- --  BASOSABS -- -- -- -- --    BANDABS -- -- -- -- --    Chemistries   Lab 01/08/12 0545 01/07/12 0520 01/06/12 0807 01/06/12 0558 01/05/12 0528 01/04/12 1340  NA 134* 130* 130* 131* 132* --  K 3.8 4.5 4.5 4.3 3.2* --  CL 96 94* 94* 94* 96 --  CO2 29 25 24 26 21  --  GLUCOSE 355* 412* 595* 564* 142* --  BUN 26* 45* 34* 32* 58* --  CREATININE 3.54* 4.45* 3.48* 3.43* 4.91* --  CALCIUM 8.7 9.2 8.9 8.8 9.6 --  MG -- -- -- -- 2.2 --  AST -- -- -- -- -- 20  ALT -- -- -- -- -- 25  ALKPHOS -- -- -- -- -- 234*  BILITOT -- -- -- -- -- 0.7   ------------------------------------------------------------------------------------------------------------------ estimated creatinine clearance is 32.5 ml/min (by C-G formula based on Cr of 3.54). ------------------------------------------------------------------------------------------------------------------  No results found for this basename: HGBA1C:2 in the last 72 hours  ------------------------------------------------------------------------------------------------------------------ No results found for this basename: CHOL:2,HDL:2,LDLCALC:2,TRIG:2,CHOLHDL:2,LDLDIRECT:2 in the last 72 hours ------------------------------------------------------------------------------------------------------------------ No results found for this basename: TSH,T4TOTAL,FREET3,T3FREE,THYROIDAB in the last 72 hours ------------------------------------------------------------------------------------------------------------------ No results found for this basename: VITAMINB12:2,FOLATE:2,FERRITIN:2,TIBC:2,IRON:2,RETICCTPCT:2 in the last 72 hours  Coagulation  profile No results found for this basename: INR:5,PROTIME:5 in the last 168 hours  No results found for this basename: DDIMER:2 in the last 72 hours  Cardiac Enzymes No results found for this basename: CK:3,CKMB:3,TROPONINI:3,MYOGLOBIN:3 in the last 168  hours ------------------------------------------------------------------------------------------------------------------ No components found with this basename: POCBNP:3  Micro Results Recent Results (from the past 240 hour(s))  MRSA PCR SCREENING     Status: Normal   Collection Time   01/04/12 10:36 PM      Component Value Range Status Comment   MRSA by PCR NEGATIVE  NEGATIVE  Final     Radiology Reports Dg Chest 2 View  01/04/2012  *RADIOLOGY REPORT*  Clinical Data: Cough  CHEST - 2 VIEW  Comparison: 04/27/2011  Findings: Previous CABG.  Mild cardiomegaly.  Mild interstitial edema.  Small bilateral pleural effusions.  IMPRESSION:  1. Mild cardiomegaly,   mild interstitial edema and tiny pleural effusions.  Original Report Authenticated By: Osa Craver, M.D.   US Soft Tissue Head/neck  01/05/2012  *RADIOLOGY REPORT*  Clinical Data: Neck abscess.  Redness and swelling.  ULTRASOUND OF HEAD/NECK SOFT TISSUES  Technique:  Ultrasound examination of the head and neck soft tissues was performed in the area of clinical concern.  Comparison:  None.  Findings: There is a 10 x 28 x 29 mm complex subcutaneous collection in the posterior right cervical region in the area of clinical concern.  IMPRESSION:  1.  Small residual subcutaneous collection in the right posterior cervical region.  Original Report Authenticated By: Thora Lance III, M.D.    Scheduled Meds:    . aspirin  325 mg Oral Daily  . atorvastatin  40 mg Oral q1800  . carvedilol  25 mg Oral BID WC  . cinacalcet  60 mg Oral QAC breakfast  . collagenase   Topical Daily  . feeding supplement  30 mL Oral BID  . heparin  5,000 Units Subcutaneous Q8H  . insulin aspart  0-15 Units Subcutaneous TID WC  . insulin aspart  0-5 Units Subcutaneous QHS  . insulin aspart  5 Units Subcutaneous TID WC  . insulin glargine  12 Units Subcutaneous BID  . isosorbide mononitrate  30 mg Oral BID  . levothyroxine  50 mcg Oral QAC breakfast   . lisinopril  20 mg Oral Daily  . omega-3 acid ethyl esters  1 g Oral Daily  . pantoprazole  80 mg Oral Q1200  . pregabalin  150 mg Oral BID  . sodium chloride  3 mL Intravenous Q12H  . DISCONTD: insulin aspart  0-20 Units Subcutaneous Q2H  . DISCONTD: insulin aspart  0-5 Units Subcutaneous QHS  . DISCONTD: insulin aspart  3 Units Subcutaneous TID WC  . DISCONTD: insulin glargine  20 Units Subcutaneous Daily   Continuous Infusions:   PRN Meds:.acetaminophen, acetaminophen, albuterol, ALPRAZolam, dextrose, feeding supplement (NEPRO CARB STEADY), heparin, heparin, heparin, lidocaine, lidocaine-prilocaine, ondansetron (ZOFRAN) IV, ondansetron, pentafluoroprop-tetrafluoroeth  Assessment & Plan    Diabetic hyperosmolar non-ketotic state (01/04/2012) Sugars high but better, no gap, stopped Insulin drip, have changed to BID Lantus & increased pre meal novo log further + ISS, says sugars at home run from 250-400 and hr feels bad if its<200. Monitor on Tele floor.   HYPOTHYROIDISM (01/26/2011) Stable currently. Pt on synthroid.    DIABETES MELLITUS, TYPE I (01/26/2011) A1c>15, as #1.   End stage renal disease (01/26/2011) Nephro on board and managing HD , T,Th Sat.   HTN (hypertension) (02/11/2011) On current regimen has fluctuated  from normotensive to 158/75 will continue to monitor.    Type 1 diabetes mellitus with diabetic foot ulcer (01/04/2012) Wound care consult has been placed.    Diabetic peripheral neuropathy (01/04/2012) Stable currently.    Hyponatremia (01/04/2012) Improving lately. Will recheck sodium levels in the AM. Fluids have been stopped.   Rt sided Neck Sab Cyst - followed by G surgery post I&D on 27th feb.    DVT Prophylaxis   Heparin    See all Orders from today for further details     Leroy Sea M.D on 01/08/2012 at 9:08 AM  [facility] Office  [phone]

## 2012-01-08 NOTE — Progress Notes (Signed)
Patient ID: Taylor [name], male   DOB: 1971-01-26, 41 y.o.   MRN: [id number] S:no new complaints   O: BP 157/79  Pulse 74  Temp(Src) 97.7 F (36.5 C) (Oral)  Resp 11  Ht 6\' 3"  (1.905 m)  Wt 85.4 kg (188 lb 4.4 oz)  BMI 23.53 kg/m2  SpO2 96%  Gen:WD WN WM in NAD CVS:RRR Resp:CTA WUJ:WJXBJY NWG:NFAOZ edema, Left AVF +T/B      . aspirin  325 mg Oral Daily  . atorvastatin  40 mg Oral q1800  . carvedilol  25 mg Oral BID WC  . cinacalcet  60 mg Oral QAC breakfast  . collagenase   Topical Daily  . feeding supplement  30 mL Oral BID  . heparin  5,000 Units Subcutaneous Q8H  . insulin aspart  0-15 Units Subcutaneous TID WC  . insulin aspart  0-5 Units Subcutaneous QHS  . insulin aspart  5 Units Subcutaneous TID WC  . insulin glargine  12 Units Subcutaneous BID  . isosorbide mononitrate  30 mg Oral BID  . levothyroxine  50 mcg Oral QAC breakfast  . lisinopril  20 mg Oral Daily  . omega-3 acid ethyl esters  1 g Oral Daily  . pantoprazole  80 mg Oral Q1200  . pregabalin  150 mg Oral BID  . sodium chloride  3 mL Intravenous Q12H  . DISCONTD: insulin aspart  0-20 Units Subcutaneous Q2H  . DISCONTD: insulin aspart  0-5 Units Subcutaneous QHS  . DISCONTD: insulin aspart  3 Units Subcutaneous TID WC  . DISCONTD: insulin glargine  20 Units Subcutaneous Daily    BMET  Lab 01/08/12 0545 01/07/12 0520 01/06/12 0807 01/06/12 0558 01/05/12 0528 01/04/12 2250 01/04/12 1340  NA 134* 130* 130* 131* 132* 127* 120*  K 3.8 4.5 4.5 4.3 3.2* 3.6 4.9  CL 96 94* 94* 94* 96 91* 82*  CO2 29 25 24 26 21 22 22   GLUCOSE 355* 412* 595* 564* 142* 405* 844*  BUN 26* 45* 34* 32* 58* 57* 54*  CREATININE 3.54* 4.45* 3.48* 3.43* 4.91* 4.70* 4.51*  ALB -- -- -- -- -- -- --  CALCIUM 8.7 9.2 8.9 8.8 9.6 9.9 9.9  PHOS -- -- -- 2.9 4.3 -- --   CBC  Lab 01/08/12 0545 01/07/12 0520 01/06/12 0807 01/06/12 0558  WBC 5.0 4.9 6.5 6.3  NEUTROABS -- -- -- --  HGB 9.8* 9.6* 9.8* 9.9*  HCT 30.4* 29.5*  30.0* 30.4*  MCV 89.9 90.5 90.4 89.1  PLT 138* 120* 130* 126*    @IMGRELPRIORS @  Assessment/Plan: 1.Hyperglycemia- will change SSI due to HD today which will significantly lower his CBG and want to prevent an hypoglycemic event. Will also continue with meal coverage which was missed yesterday and likely is etiology of jump in CBG. Agree with increasing lantus to 15 bid 2.ESRD cont with HD qTTS 3. Anemia:on epo  4. Diabetic ulcer- per wound care  5. Nutrition:cont with prot supplementation  6. Hypertension:stable  7. dispo- hopeful d/c tomorrow if CBG's remain controlled but his Hgb A1C was 15% and likely has CBG's in 500's at home.  Pt is stable for discharge home from our standpoint as his sugars are better now than they have been over the last 4 months Taylor [name] A

## 2012-01-09 ENCOUNTER — Inpatient Hospital Stay (HOSPITAL_COMMUNITY): Payer: Medicare Other

## 2012-01-09 LAB — GLUCOSE, CAPILLARY
Glucose-Capillary: 498 mg/dL — ABNORMAL HIGH (ref 70–99)
Glucose-Capillary: 392 mg/dL — ABNORMAL HIGH (ref 70–99)
Glucose-Capillary: 184 mg/dL — ABNORMAL HIGH (ref 70–99)
Glucose-Capillary: 97 mg/dL (ref 70–99)
Glucose-Capillary: 86 mg/dL (ref 70–99)
Glucose-Capillary: 106 mg/dL — ABNORMAL HIGH (ref 70–99)

## 2012-01-09 LAB — BASIC METABOLIC PANEL
CO2: 28 mEq/L (ref 19–32)
Chloride: 94 mEq/L — ABNORMAL LOW (ref 96–112)
GFR calc non Af Amer: 14 mL/min — ABNORMAL LOW (ref >90)
Glucose, Bld: 458 mg/dL — ABNORMAL HIGH (ref 70–99)
Potassium: 4.3 mEq/L (ref 3.5–5.1)
Sodium: 133 mEq/L — ABNORMAL LOW (ref 135–145)

## 2012-01-09 LAB — COMPREHENSIVE METABOLIC PANEL
AST: 18 U/L (ref 0–37)
BUN: 43 mg/dL — ABNORMAL HIGH (ref 6–23)
CO2: 28 mEq/L (ref 19–32)
Calcium: 8.8 mg/dL (ref 8.4–10.5)
Creatinine, Ser: 4.78 mg/dL — ABNORMAL HIGH (ref 0.50–1.35)
GFR calc non Af Amer: 14 mL/min — ABNORMAL LOW (ref >90)

## 2012-01-09 LAB — CBC
Hemoglobin: 10.3 g/dL — ABNORMAL LOW (ref 13.0–17.0)
RBC: 3.5 MIL/uL — ABNORMAL LOW (ref 4.22–5.81)

## 2012-01-09 MED ORDER — INSULIN GLARGINE 100 UNIT/ML ~~LOC~~ SOLN
16.0000 [IU] | Freq: Two times a day (BID) | SUBCUTANEOUS | Status: DC
  Administered 2012-01-09 (×2): 16 [IU] via SUBCUTANEOUS
  Filled 2012-01-09: qty 3

## 2012-01-09 MED ORDER — INSULIN ASPART 100 UNIT/ML ~~LOC~~ SOLN
0.0000 [IU] | Freq: Three times a day (TID) | SUBCUTANEOUS | Status: DC
  Administered 2012-01-09: 20 [IU] via SUBCUTANEOUS
  Administered 2012-01-09 – 2012-01-10 (×2): 4 [IU] via SUBCUTANEOUS

## 2012-01-09 MED ORDER — INSULIN GLARGINE 100 UNIT/ML ~~LOC~~ SOLN: 16.0000 [IU] | Freq: Two times a day (BID) | SUBCUTANEOUS | Status: DC

## 2012-01-09 MED ORDER — INSULIN ASPART 100 UNIT/ML ~~LOC~~ SOLN
0.0000 [IU] | Freq: Every day | SUBCUTANEOUS | Status: DC
  Filled 2012-01-09: qty 3

## 2012-01-09 MED ORDER — INSULIN ASPART 100 UNIT/ML ~~LOC~~ SOLN
7.0000 [IU] | Freq: Three times a day (TID) | SUBCUTANEOUS | Status: DC
  Administered 2012-01-09 (×3): 7 [IU] via SUBCUTANEOUS

## 2012-01-09 NOTE — Progress Notes (Signed)
Physical Therapy Note  Taylor [name] presents sitting upright in his bed in no distress. Reports he was independent with cane at home and has not had any changes in his mobility status since admit. Reports he has been able to ambulate to and from the bathroom with no difficulty.  At this time he declines ambulation for PT eval. Pt denies any safety concerns at home. No PT warranted at this time. No PT follow up needed. Ivonne Andrew PT, DPT [phone]

## 2012-01-09 NOTE — Procedures (Signed)
I was present at this dialysis session. I have reviewed the session itself and made appropriate changes. Patient stable, BFR 400 and BP 130/60.   Vinson Moselle, MD [employer] 01/09/2012, 6:32 PM

## 2012-01-09 NOTE — Progress Notes (Signed)
Taylor [name] CSN:[id number],MRN:[id number] is a 41 y.o. male,  Outpatient Primary MD for the patient is [name], MD, MD  Chief Complaint  Patient presents with  . Hyperglycemia        Subjective:    Taylor [name] today has, No headache, No chest pain, No abdominal pain - No Nausea, No new weakness tingling or numbness, No Cough - SOB.     Objective:   Filed Vitals:   01/08/12 1223 01/08/12 1330 01/08/12 2100 01/09/12 0500  BP: 146/73 129/74 141/68 158/82  Pulse: 76 78 76 80  Temp: 98 F (36.7 C) 98 F (36.7 C) 98 F (36.7 C) 98 F (36.7 C)  TempSrc: Oral Oral Oral Oral  Resp: 14 18 18 20   Height:      Weight:    88 kg (194 lb 0.1 oz)  SpO2: 97% 98% 97% 98%    Wt Readings from Last 3 Encounters:  01/09/12 88 kg (194 lb 0.1 oz)  01/04/12 89.812 kg (198 lb)  11/23/11 90.039 kg (198 lb 8 oz)     Intake/Output Summary (Last 24 hours) at 01/09/12 0815 Last data filed at 01/08/12 1300  Gross per 24 hour  Intake    600 ml  Output    300 ml  Net    300 ml    Exam  Awake Alert, Oriented *3, No new F.N deficits, Normal affect [place].AT,PERRAL Supple Neck,No JVD, Rt posterior sebaceous cyst, No cervical lymphadenopathy appriciated.  Symmetrical Chest wall movement, Good air movement bilaterally, CTAB RRR,No Gallops,Rubs or new Murmurs, No Parasternal Heave +ve B.Sounds, Abd Soft, Non tender, No organomegaly appriciated, No rebound -guarding or rigidity. 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   Data Review   CBC  Lab 01/09/12 0600 01/08/12 0545 01/07/12 0520 01/06/12 0807 01/06/12 0558  WBC 4.3 5.0 4.9 6.5 6.3  HGB 10.3* 9.8* 9.6* 9.8* 9.9*  HCT 32.2* 30.4* 29.5* 30.0* 30.4*  PLT 135* 138* 120* 130* 126*  MCV 92.0 89.9 90.5 90.4 89.1  MCH 29.4 29.0 29.4 29.5 29.0  MCHC 32.0 32.2 32.5 32.7 32.6  RDW 14.6 14.4 14.5 14.4 14.5  LYMPHSABS -- -- -- -- --  MONOABS -- -- -- -- --    EOSABS -- -- -- -- --  BASOSABS -- -- -- -- --  BANDABS -- -- -- -- --    Chemistries   Lab 01/09/12 0600 01/08/12 0545 01/07/12 0520 01/06/12 0807 01/06/12 0558 01/05/12 0528 01/04/12 1340  NA 133* 134* 130* 130* 131* -- --  K 4.3 3.8 4.5 4.5 4.3 -- --  CL 94* 96 94* 94* 94* -- --  CO2 28 29 25 24 26  -- --  GLUCOSE 458* 355* 412* 595* 564* -- --  BUN 40* 26* 45* 34* 32* -- --  CREATININE 4.81* 3.54* 4.45* 3.48* 3.43* -- --  CALCIUM 8.6 8.7 9.2 8.9 8.8 -- --  MG -- -- -- -- -- 2.2 --  AST -- -- -- -- -- -- 20  ALT -- -- -- -- -- -- 25  ALKPHOS -- -- -- -- -- -- 234*  BILITOT -- -- -- -- -- -- 0.7   ------------------------------------------------------------------------------------------------------------------ estimated creatinine clearance is 23.9 ml/min (by C-G formula based on Cr of 4.81). ------------------------------------------------------------------------------------------------------------------  No results found for this basename: HGBA1C:2 in the last 72 hours  ------------------------------------------------------------------------------------------------------------------ No results found for this basename: CHOL:2,HDL:2,LDLCALC:2,TRIG:2,CHOLHDL:2,LDLDIRECT:2 in the last 72 hours ------------------------------------------------------------------------------------------------------------------ No  results found for this basename: TSH,T4TOTAL,FREET3,T3FREE,THYROIDAB in the last 72 hours ------------------------------------------------------------------------------------------------------------------ No results found for this basename: VITAMINB12:2,FOLATE:2,FERRITIN:2,TIBC:2,IRON:2,RETICCTPCT:2 in the last 72 hours  Coagulation profile No results found for this basename: INR:5,PROTIME:5 in the last 168 hours  No results found for this basename: DDIMER:2 in the last 72 hours  Cardiac Enzymes No results found for this basename: CK:3,CKMB:3,TROPONINI:3,MYOGLOBIN:3 in  the last 168 hours ------------------------------------------------------------------------------------------------------------------ No components found with this basename: POCBNP:3  Micro Results Recent Results (from the past 240 hour(s))  MRSA PCR SCREENING     Status: Normal   Collection Time   01/04/12 10:36 PM      Component Value Range Status Comment   MRSA by PCR NEGATIVE  NEGATIVE  Final     Radiology Reports Dg Chest 2 View  01/04/2012  *RADIOLOGY REPORT*  Clinical Data: Cough  CHEST - 2 VIEW  Comparison: 04/27/2011  Findings: Previous CABG.  Mild cardiomegaly.  Mild interstitial edema.  Small bilateral pleural effusions.  IMPRESSION:  1. Mild cardiomegaly,   mild interstitial edema and tiny pleural effusions.  Original Report Authenticated By: Osa Craver, M.D.   US Soft Tissue Head/neck  01/05/2012  *RADIOLOGY REPORT*  Clinical Data: Neck abscess.  Redness and swelling.  ULTRASOUND OF HEAD/NECK SOFT TISSUES  Technique:  Ultrasound examination of the head and neck soft tissues was performed in the area of clinical concern.  Comparison:  None.  Findings: There is a 10 x 28 x 29 mm complex subcutaneous collection in the posterior right cervical region in the area of clinical concern.  IMPRESSION:  1.  Small residual subcutaneous collection in the right posterior cervical region.  Original Report Authenticated By: Thora Lance III, M.D.    Scheduled Meds:    . aspirin  325 mg Oral Daily  . atorvastatin  40 mg Oral q1800  . carvedilol  25 mg Oral BID WC  . cinacalcet  60 mg Oral QAC breakfast  . collagenase   Topical Daily  . feeding supplement  30 mL Oral BID  . heparin  5,000 Units Subcutaneous Q8H  . insulin aspart  0-20 Units Subcutaneous TID WC  . insulin aspart  0-5 Units Subcutaneous QHS  . insulin aspart  7 Units Subcutaneous TID WC  . insulin glargine  16 Units Subcutaneous BID  . isosorbide mononitrate  30 mg Oral BID  . levothyroxine  50 mcg Oral  QAC breakfast  . lisinopril  20 mg Oral Daily  . omega-3 acid ethyl esters  1 g Oral Daily  . pantoprazole  80 mg Oral Q1200  . pregabalin  150 mg Oral BID  . sodium chloride  3 mL Intravenous Q12H  . DISCONTD: insulin aspart  0-15 Units Subcutaneous TID WC  . DISCONTD: insulin aspart  0-5 Units Subcutaneous QHS  . DISCONTD: insulin aspart  5 Units Subcutaneous TID WC  . DISCONTD: insulin glargine  12 Units Subcutaneous BID  . DISCONTD: insulin glargine  16 Units Subcutaneous BID   Continuous Infusions:   PRN Meds:.acetaminophen, acetaminophen, albuterol, ALPRAZolam, dextrose, feeding supplement (NEPRO CARB STEADY), heparin, heparin, heparin, heparin, lidocaine, lidocaine-prilocaine, ondansetron (ZOFRAN) IV, ondansetron, pentafluoroprop-tetrafluoroeth  Assessment & Plan    Diabetic hyperosmolar non-ketotic state (01/04/2012) Sugars high but better, no gap, stopped Insulin drip, have changed to BID Lantus and increased the dose & increased pre meal novo log further + ISS, says sugars at home run from 250-400 and hr feels bad if its<200. Patient appears to be eating candy and drinking  soda drinks- counseled.   HYPOTHYROIDISM (01/26/2011) Stable currently. Pt on synthroid.    DIABETES MELLITUS, TYPE I (01/26/2011) A1c>15, as #1.   End stage renal disease (01/26/2011) Nephro on board and managing HD , T,Th Sat.   HTN (hypertension) (02/11/2011) On current regimen stable will continue to monitor.    Type 1 diabetes mellitus with diabetic foot ulcer (01/04/2012) Wound care consult has been placed.    Diabetic peripheral neuropathy (01/04/2012) Stable currently.    Hyponatremia (01/04/2012) Improving lately. Will recheck sodium levels in the AM. Fluids have been stopped.   Rt sided Neck Sab Cyst - followed by G surgery post I&D on 27th feb. Stable. Outpt Surg follow up.    DVT Prophylaxis   Heparin    See all Orders from today for further details     Leroy Sea M.D  on 01/09/2012 at 8:15 AM  [facility] Office  [phone]

## 2012-01-10 LAB — CBC
HCT: 32.2 % — ABNORMAL LOW (ref 39.0–52.0)
Platelets: 152 10*3/uL (ref 150–400)
RDW: 14.4 % (ref 11.5–15.5)
WBC: 5.4 10*3/uL (ref 4.0–10.5)

## 2012-01-10 LAB — BASIC METABOLIC PANEL
BUN: 24 mg/dL — ABNORMAL HIGH (ref 6–23)
Chloride: 99 mEq/L (ref 96–112)
GFR calc Af Amer: 23 mL/min — ABNORMAL LOW (ref >90)
Potassium: 4.4 mEq/L (ref 3.5–5.1)
Sodium: 137 mEq/L (ref 135–145)

## 2012-01-10 LAB — GLUCOSE, CAPILLARY: Glucose-Capillary: 87 mg/dL (ref 70–99)

## 2012-01-10 MED ORDER — INSULIN ASPART 100 UNIT/ML ~~LOC~~ SOLN: 6.0000 [IU] | Freq: Three times a day (TID) | SUBCUTANEOUS | Status: DC

## 2012-01-10 MED ORDER — INSULIN ASPART 100 UNIT/ML ~~LOC~~ SOLN
6.0000 [IU] | Freq: Three times a day (TID) | SUBCUTANEOUS | Status: DC
Start: 2012-01-10 — End: 2012-01-10
  Administered 2012-01-10: 6 [IU] via SUBCUTANEOUS

## 2012-01-10 MED ORDER — INSULIN ASPART 100 UNIT/ML ~~LOC~~ SOLN: 0.0000 [IU] | Freq: Three times a day (TID) | SUBCUTANEOUS | Status: DC

## 2012-01-10 MED ORDER — INSULIN GLARGINE 100 UNIT/ML ~~LOC~~ SOLN
15.0000 [IU] | Freq: Two times a day (BID) | SUBCUTANEOUS | Status: DC
  Administered 2012-01-10: 15 [IU] via SUBCUTANEOUS

## 2012-01-10 MED ORDER — INSULIN GLARGINE 100 UNIT/ML ~~LOC~~ SOLN: 15.0000 [IU] | Freq: Two times a day (BID) | SUBCUTANEOUS | Status: DC

## 2012-01-10 MED ORDER — INSULIN GLARGINE 100 UNIT/ML ~~LOC~~ SOLN: 14.0000 [IU] | Freq: Two times a day (BID) | SUBCUTANEOUS | Status: DC

## 2012-01-10 NOTE — Discharge Summary (Signed)
Taylor [name], 41 y.o., DOB Dec 22, 1970, MRN [id number]. Admission date: 01/04/2012 Discharge Date 01/10/2012 Primary MD [name], MD, MD Admitting Physician Marcellus Scott, MD  Admission Diagnosis  Diabetic ketoacidosis [250.10] Hyperglycemia [790.29] Diabetic foot ulcer [250.80, 707.15] DKA QUESTIONABLE  Discharge Diagnosis   Principal Problem:  *Diabetic hyperosmolar non-ketotic state Active Problems:  HYPOTHYROIDISM  DIABETES MELLITUS, TYPE I  End stage renal disease  HTN (hypertension)  Type 1 diabetes mellitus with diabetic foot ulcer  Diabetic peripheral neuropathy  Hyponatremia    Past Medical History  Diagnosis Date  . End stage renal disease on dialysis     LUE fistula  . Diabetes mellitus     TYPE I  . Diabetic neuropathy     sever, s/p multiple toe amputation  . Thyroid disease     hypothyroidism  . Coronary artery disease 10/2009    CABG; Hx. of MI  . Myocardial infarction   . Hypertension   . Hyperlipidemia   . MI (myocardial infarction)   . ESRD on hemodialysis   . Heart murmur   . Shortness of breath   . Hypothyroidism   . H/O hiatal hernia   . Neuromuscular disorder     Diabetic neuropathy  . GERD (gastroesophageal reflux disease)   . Anxiety   . Pneumonia     Past Surgical History  Procedure Date  . Dialysis fistula creation     left upper air fistula  . Amputation     toe  . Coronary artery bypass graft 10/2009    [facility] (Dr. Katrinka Blazing)  . Abscess drainage Behind right ear/occipital scalp  . Skin graft      Hospital Course See H&P, Labs, Consult and Test reports for all details in brief, patient was admitted for    Diabetic hyperosmolar non-ketotic state (01/04/2012) - post IVF and Insulin drip, now Sugars much better now with BID Lantus, premeal Novolog+ISS, pt educated on accuchecks and Glycemic control, consider outpt Endo referral, A1c was >15.  CBG (last 3)   Basename 01/10/12 0747 01/09/12 2202 01/09/12 2040  GLUCAP 155*  106* 86      HYPOTHYROIDISM (01/26/2011) Stable currently. Pt on synthroid.   DIABETES MELLITUS, TYPE I (01/26/2011) A1c>15, as #1.   End stage renal disease (01/26/2011) Outpt HD to continue T,Th Sat.   HTN (hypertension) (02/11/2011) On current regimen stable will continue to monitor.   Type 1 diabetes mellitus with diabetic foot ulcer (01/04/2012) Wound care consult has been placed for Home. Referred to outpt W clinic too per his request.  Diabetic peripheral neuropathy (01/04/2012) Stable currently.   Hyponatremia (01/04/2012) Improving lately. Counseled on fluid restriction.    Rt sided Neck Sab Cyst - followed by G surgery post I&D on 27th feb. Stable. Outpt Surg follow up.       Consults  Renal, Surgery  Significant Tests:  See full reports for all details     Dg Chest 2 View  01/04/2012  *RADIOLOGY REPORT*  Clinical Data: Cough  CHEST - 2 VIEW  Comparison: 04/27/2011  Findings: Previous CABG.  Mild cardiomegaly.  Mild interstitial edema.  Small bilateral pleural effusions.  IMPRESSION:  1. Mild cardiomegaly,   mild interstitial edema and tiny pleural effusions.  Original Report Authenticated By: Osa Craver, M.D.   US Soft Tissue Head/neck  01/05/2012  *RADIOLOGY REPORT*  Clinical Data: Neck abscess.  Redness and swelling.  ULTRASOUND OF HEAD/NECK SOFT TISSUES  Technique:  Ultrasound examination of the head and neck soft tissues  was performed in the area of clinical concern.  Comparison:  None.  Findings: There is a 10 x 28 x 29 mm complex subcutaneous collection in the posterior right cervical region in the area of clinical concern.  IMPRESSION:  1.  Small residual subcutaneous collection in the right posterior cervical region.  Original Report Authenticated By: Osa Craver, M.D.     Today   Subjective:   Juandavid Dallman today has no headache,no chest abdominal pain,no new weakness tingling or numbness, feels much better wants to go home  today.    Objective:   Blood pressure 108/61, pulse 73, temperature 98 F (36.7 C), temperature source Oral, resp. rate 18, height 6\' 3"  (1.905 m), weight 86.4 kg (190 lb 7.6 oz), SpO2 98.00%.  Intake/Output Summary (Last 24 hours) at 01/10/12 0839 Last data filed at 01/09/12 2100  Gross per 24 hour  Intake    480 ml  Output   2509 ml  Net  -2029 ml    Exam Awake Alert, Oriented *3, No new F.N deficits, Normal affect [place].AT,PERRAL Supple Neck,No JVD, No cervical lymphadenopathy appriciated.  Symmetrical Chest wall movement, Good air movement bilaterally, CTAB RRR,No Gallops,Rubs or new Murmurs, No Parasternal Heave +ve B.Sounds, Abd Soft, Non tender, No organomegaly appriciated, No rebound -guarding or rigidity. No Cyanosis, Clubbing or edema, No new Rash or bruise, Rt foot and neck wound in bandage, no surrounding cellulitis.  Data Review     CBC w Diff: Lab Results  Component Value Date   WBC 5.4 01/10/2012   HGB 10.3* 01/10/2012   HCT 32.2* 01/10/2012   PLT 152 01/10/2012   LYMPHOPCT 10* 04/27/2011   MONOPCT 6 04/27/2011   EOSPCT 11* 04/27/2011   BASOPCT 0 04/27/2011   CMP: Lab Results  Component Value Date   NA 137 01/10/2012   K 4.4 01/10/2012   CL 99 01/10/2012   CO2 30 01/10/2012   BUN 24* 01/10/2012   CREATININE 3.49* 01/10/2012   PROT 7.1 01/09/2012   ALBUMIN 2.7* 01/09/2012   BILITOT 0.2* 01/09/2012   ALKPHOS 155* 01/09/2012   AST 18 01/09/2012   ALT 15 01/09/2012  .  Micro Results Recent Results (from the past 240 hour(s))  MRSA PCR SCREENING     Status: Normal   Collection Time   01/04/12 10:36 PM      Component Value Range Status Comment   MRSA by PCR NEGATIVE  NEGATIVE  Final      Discharge Instructions     Follow with Primary MD [name], MD, MD in 3 days   Get CBC, CMP, checked 3 days by Primary MD and again as instructed by your Primary MD.    Get Medicines reviewed and adjusted.  Please request your Prim.MD to go over all Hospital Tests and Procedure/Radiological  results at the follow up, please get all Hospital records sent to your Prim MD by signing hospital release before you go home.  Accuchecks 4 times/day, Once in AM empty stomach and then before each meal. Log in all results and show them to your Prim.MD in 3 days. If any glucose reading is under 80 or above 300 call your Prim MD immidiately. Follow Low glucose instructions for glucose under 80 as instructed.     Follow-up Information   Follow up with Velora Heckler, MD. Schedule an appointment as soon as possible for a visit in 1 week. (You will need to come back and schedule removal of this cyst.  You can  do it with [name] or with your doctor in [place].)   Contact information:  [facility] [address] [place] [address] [phone]     Follow up with [name], MD. Schedule an appointment as soon as possible for a visit in 3 days.  Contact information:  [address] [place] [address] [phone]         Activity: Fall precautions use walker/cane & assistance as needed  Diet: Renal-Diabetic, 1.5lit/day fluid restriction, Aspiration precautions.  For Heart failure patients - Check your Weight same time everyday, if you gain over 2 pounds, or you develop in leg swelling, experience more shortness of breath or chest pain, call your Primary MD immediately. Follow Cardiac Low Salt Diet and 1.8 lit/day fluid restriction.  Disposition Home  If you experience worsening of your admission symptoms, develop shortness of breath, life threatening emergency, suicidal or homicidal thoughts you must seek medical attention immediately by calling 911 or calling your MD immediately  if symptoms less severe.  You Must read complete instructions/literature along with all the possible adverse reactions/side effects for all the Medicines you take and that have been prescribed to you. Take any new  Medicines after you have completely understood and accpet all the possible adverse reactions/side effects.   Do not drive if your were admitted for syncope or siezures until you have seen by Primary MD or a Neurologist and advised to drive.  Do not drive when taking Pain medications.    Do not take more than prescribed Pain, Sleep and Anxiety Medications  Special Instructions: If you have smoked or chewed Tobacco  in the last 2 yrs please stop smoking, stop any regular Alcohol  and or any Recreational drug use.  Wear Seat belts while driving.   Abscess:  Clean with soap and water, you can shower.  Pack if you can  Till it's healed. An abscess (boil or furuncle) is an infected area that contains a collection of pus.  SYMPTOMS Signs and symptoms of an abscess include pain, tenderness, redness, or hardness. You may feel a moveable soft area under your skin. An abscess can occur anywhere in the body.  TREATMENT  A surgical cut (incision) may be made over your abscess to drain the pus. Gauze may be packed into the space or a drain may be looped through the abscess cavity (pocket). This provides a drain that will allow the cavity to heal from the inside outwards. The abscess may be painful for a few days, but should feel much better if it was drained.  Your abscess, if seen early, may not have localized and may not have been drained. If not, another appointment may be required if it does not get better on its own or with medications. HOME CARE INSTRUCTIONS  Only take over-the-counter or prescription medicines for pain, discomfort, or fever as directed by your caregiver.  Take your antibiotics as directed if they were prescribed. Finish them even if you start to feel better.  Keep the skin and clothes clean around your abscess.  If the abscess was drained, you will need to use gauze dressing to collect any draining pus. Dressings will typically need to be changed 3 or more times a day.  The  infection may spread by skin contact with others. Avoid skin contact as much as possible.  Practice good hygiene. This includes regular hand washing, cover any draining skin lesions, and do  not share personal care items.  If you participate in sports, do not share athletic equipment, towels, whirlpools, or personal care items. Shower after every practice or tournament.  If a draining area cannot be adequately covered:  Do not participate in sports.  Children should not participate in day care until the wound has healed or drainage stops.  If your caregiver has given you a follow-up appointment, it is very important to keep that appointment. Not keeping the appointment could result in a much worse infection, chronic or permanent injury, pain, and disability. If there is any problem keeping the appointment, you must call back to this facility for assistance.  SEEK MEDICAL CARE IF:  You develop increased pain, swelling, redness, drainage, or bleeding in the wound site.  You develop signs of generalized infection including muscle aches, chills, fever, or a general ill feeling.  You have an oral temperature above 102 F (38.9 C).  MAKE SURE YOU:  Understand these instructions.  Will watch your condition.  Will get help right away if you are not doing well or get worse.  Document Released: 08/05/2005 Document Revised: 07/08/2011 Document Reviewed: 05/29/2008 [facility] Patient Information 2012 [place], [place].  Follow-up Information    Follow up with Velora Heckler, MD. Schedule an appointment as soon as possible for a visit in 1 week. (You will need to come back and schedule removal of this cyst.  You can do it with [name] or with your doctor in [place].)    Contact information:   [facility] [address] [place] [address] [phone]       Follow up with [name], MD. Schedule an appointment as soon as possible for a visit in 3 days.   Contact  information:   [address] [place] [address] [phone]          Discharge Medications   Medication List  As of 01/10/2012  8:48 AM   START taking these medications         * insulin aspart 100 UNIT/ML injection   Commonly known as: novoLOG   Inject 0-20 Units into the skin 3 (three) times daily with meals. Before each meal 3 times a day, 140-199 - 2 units, 200-250 - 4 units, 250-299 - 6 units, 300-349 - 8 units,  350 or above 10 units. Take along with premeal Novolog.     * Notice: This list has 1 medication(s) that are the same as other medications prescribed for you. Read the directions carefully, and ask your doctor or other care provider to review them with you.       CHANGE how you take these medications         * insulin aspart 100 UNIT/ML injection   Commonly known as: novoLOG   Inject 6 Units into the skin 3 (three) times daily before meals. Plus the sliding scale   What changed: - dose - doctor's instructions      insulin glargine 100 UNIT/ML injection   Commonly known as: LANTUS   Inject 14 Units into the skin 2 (two) times daily.   What changed: - dose - how often to take the med     * Notice: This list has 1 medication(s) that are the same as other medications prescribed for you. Read the directions carefully, and ask your doctor or other care provider to review them with you.       CONTINUE  taking these medications         ALPRAZolam 0.5 MG tablet   Commonly known as: XANAX   Take 1 tablet (0.5 mg total) by mouth 3 (three) times daily as needed for sleep or anxiety.      aspirin 325 MG tablet      carvedilol 25 MG tablet   Commonly known as: COREG   Take 1 tablet (25 mg total) by mouth 2 (two) times daily with a meal.      cinacalcet 60 MG tablet   Commonly known as: SENSIPAR   Take 1 tablet (60 mg total) by mouth daily. With biggest meal      Fish Oil 1000 MG Caps      furosemide 80 MG tablet    Commonly known as: LASIX   Take 1 tablet (80 mg total) by mouth 2 (two) times daily.      isosorbide mononitrate 30 MG 24 hr tablet   Commonly known as: IMDUR   Take 1 tablet (30 mg total) by mouth 2 (two) times daily.      levothyroxine 50 MCG tablet   Commonly known as: SYNTHROID, LEVOTHROID   TAKE ONE TABLET BY MOUTH DAILY      mupirocin ointment 2 %   Commonly known as: BACTROBAN      omeprazole 40 MG capsule   Commonly known as: PRILOSEC   Take 1 capsule (40 mg total) by mouth daily.      pregabalin 150 MG capsule   Commonly known as: LYRICA   Take 1 capsule (150 mg total) by mouth 2 (two) times daily.      rosuvastatin 20 MG tablet   Commonly known as: CRESTOR   Take 1 tablet (20 mg total) by mouth daily.         STOP taking these medications         Lidocaine HCl 2.5 % Kit      lisinopril 20 MG tablet         ASK your doctor about these medications         RENVELA 2.4 G Pack   Generic drug: Sevelamer Carbonate          Where to get your medications    These are the prescriptions that you need to pick up. We sent them to a specific pharmacy, so you will need to go there to get them.   [facility] [address] - [place], [place] - [address] AT [facility] OF [address]    [address] [place] [address]    Phone: [phone]        insulin aspart 100 UNIT/ML injection   insulin glargine 100 UNIT/ML injection         You may get these medications from any pharmacy.         insulin aspart 100 UNIT/ML injection             Total Time in preparing paper work, data evaluation and todays exam - 35 minutes  Leroy Sea M.D on 01/10/2012 at 8:39 AM  [facility] Office  [phone]

## 2012-01-10 NOTE — Progress Notes (Signed)
   CARE MANAGEMENT NOTE 01/10/2012  Patient:  Taylor [name], Taylor [name]   Account Number:  [id number]  Date Initiated:  01/10/2012  Documentation initiated by:  [facility]  Subjective/Objective Assessment:   DM, wound, HD     Action/Plan:   Anticipated DC Date:  01/10/2012   Anticipated DC Plan:  HOME W HOME HEALTH SERVICES      DC Planning Services  CM consult      [facility] Choice  HOME HEALTH   Choice offered to / List presented to:  C-1 Patient        HH arranged  HH-1 RN      Status of service:  In process, will continue to follow Medicare Important Message given?   (If response is "NO", the following Medicare IM given date fields will be blank) Date Medicare IM given:   Date Additional Medicare IM given:    Discharge Disposition:  HOME W HOME HEALTH SERVICES  Per UR Regulation:    Comments:  3/3//2013 0900 Spoke to patient and states he had [facility] in the past. Wants to continue with their services. Pt states at this time he does not need HHPT. Contacted [facility] and spoke to on call RN. Requested referral be faxed to [phone]. They will scheduled services for 3/4. Faxed orders, F2F, facesheet and d/c summary to [facility] for [facility] RN. Will follow up with [facility] for referral. Isidoro Donning RN CCM Case Mgmt phone [phone]

## 2012-01-11 ENCOUNTER — Telehealth: Payer: Self-pay | Admitting: *Deleted

## 2012-01-11 ENCOUNTER — Telehealth: Payer: Self-pay | Admitting: Family Medicine

## 2012-01-11 NOTE — Telephone Encounter (Signed)
Spoke with Angelique Blonder with [facility].  They need order for nursing services for wound care, verbal order ok for now.  Verbal order given.

## 2012-01-11 NOTE — Telephone Encounter (Signed)
See previous note, closed in error.

## 2012-01-11 NOTE — Telephone Encounter (Signed)
Taylor [name], please call Stanton Kidney to find out exactly what they need. This should have been set up prior to discharge from hospital.

## 2012-01-11 NOTE — Progress Notes (Signed)
   CARE MANAGEMENT NOTE 01/11/2012  Patient:  Taylor [name], Taylor [name]   Account Number:  [id number]  Date Initiated:  01/10/2012  Documentation initiated by:  [facility]  Subjective/Objective Assessment:   DM, wound, HD     Action/Plan:   Anticipated DC Date:  01/10/2012   Anticipated DC Plan:  HOME W HOME HEALTH SERVICES      DC Planning Services  CM consult      [facility] Choice  HOME HEALTH   Choice offered to / List presented to:  C-1 Patient        HH arranged  HH-1 RN      Status of service:  Completed, signed off Medicare Important Message given?   (If response is "NO", the following Medicare IM given date fields will be blank) Date Medicare IM given:   Date Additional Medicare IM given:    Discharge Disposition:  HOME W HOME HEALTH SERVICES  Per UR Regulation:  Reviewed for med. necessity/level of care/duration of stay  Comments:  01/11/2012 0900 Contacted pt and states he [facility] in [place]. Wanted to continue with that center. [facility] in [place] [phone]. Spoke to [place], and fax number [phone]. Faxed order, facesheet and d/c summary to [facility]. States they saw pt in 07/2011 and will call him for appt. Contacted pt again with update and provide phone number for [facility]. Encouraged him to call if he has not heard back in next couple of days for appt. Isidoro Donning RN CCM Case Mgmt phone [phone]  3/3//2013 0900 Spoke to patient and states he had [facility] in the past. Wants to continue with their services. Pt states at this time he does not need HHPT. Contacted [facility] and spoke to on call RN. Requested referral be faxed to [phone]. They will scheduled services for 3/4. Faxed orders, F2F, facesheet and d/c summary to [facility] for [facility] RN. Will follow up with [facility] [place] for referral. Isidoro Donning RN CCM Case Mgmt phone [phone]

## 2012-01-11 NOTE — Telephone Encounter (Signed)
Noted! Thank you

## 2012-01-11 NOTE — Telephone Encounter (Signed)
Triage Record Num: [id number] Operator: Caswell Corwin Patient Name: Taylor [name] Call Date & Time: 01/11/2012 10:53:12AM Patient Phone: [phone] PCP: Kennis Carina [facility]) Patient Gender: Male PCP Fax : [phone] Patient DOB: 1971-03-31 Practice Name: [facility] Reason for Call: OFFICE NOTE! Caller: Stanton Kidney from [facility] calling. ; PCP: [name] [facility]); CB#: [phone]; Call regarding diabetic Wound Care on his foot. He was discharged from [facility] yesterday. THEY NEED AN ORDER TO START HH VISITS AND CARE. CONTACT # AS ABOVE. OFFICE NOTE! Protocol(s) Used: Office Note Recommended Outcome per Protocol: Information Noted and Sent to Office Reason for Outcome: Caller information to office Care Advice: ~ 01/11/2012 10:57:45AM Page 1 of 1 [id number]

## 2012-01-13 ENCOUNTER — Encounter: Payer: Self-pay | Admitting: Nurse Practitioner

## 2012-01-13 ENCOUNTER — Encounter: Payer: Self-pay | Admitting: Cardiothoracic Surgery

## 2012-01-18 ENCOUNTER — Other Ambulatory Visit: Payer: Self-pay | Admitting: Family Medicine

## 2012-01-19 NOTE — Telephone Encounter (Signed)
Medicine called to [facility]. 

## 2012-01-29 ENCOUNTER — Telehealth: Payer: Self-pay | Admitting: *Deleted

## 2012-01-29 NOTE — Telephone Encounter (Signed)
Advised patient.  He said he will check his schedule and will call back.

## 2012-01-29 NOTE — Telephone Encounter (Signed)
Medical supply company has faxed a form for an erection vacuum devise.  I spoke with the patient and he does want this. Forms are on your desk.

## 2012-01-29 NOTE — Telephone Encounter (Signed)
Form reviewed. Need note stating that we have discussed erectile dysfunction in an office visit, which we have not. I cannot complete form without that.

## 2012-01-29 NOTE — Telephone Encounter (Signed)
Left message asking pt to call back. 

## 2012-02-02 ENCOUNTER — Ambulatory Visit (INDEPENDENT_AMBULATORY_CARE_PROVIDER_SITE_OTHER): Payer: Medicare Other | Admitting: Surgery

## 2012-02-02 ENCOUNTER — Encounter (INDEPENDENT_AMBULATORY_CARE_PROVIDER_SITE_OTHER): Payer: Self-pay | Admitting: Surgery

## 2012-02-02 VITALS — BP 128/72 | HR 80 | Temp 97.0°F | Resp 18 | Ht 75.0 in | Wt 190.8 lb

## 2012-02-02 DIAGNOSIS — L723 Sebaceous cyst: Secondary | ICD-10-CM

## 2012-02-02 DIAGNOSIS — L0291 Cutaneous abscess, unspecified: Secondary | ICD-10-CM

## 2012-02-02 DIAGNOSIS — L0390 Cellulitis, unspecified: Secondary | ICD-10-CM

## 2012-02-02 NOTE — Patient Instructions (Signed)
Apply antibiotic ointment to wound 2 - 3 times daily and cover with band-aid or dry gauze bandage.  Wash daily with soap and water.  tmg

## 2012-02-02 NOTE — Progress Notes (Signed)
Visit Diagnoses: 1. Abscess and cellulitis   2. Sebaceous cyst, neck     HISTORY: Patient is a 41 year old white male who was seen on the general surgery service in late February. He had an infected sebaceous cyst on the right posterior neck. This was a recurrence of a previous infection. He has never had definitive excision. He returns today for wound check and to discuss surgical excision of the sebaceous cyst.  EXAM: On the right posterior neck isn't open wound measuring 1 cm in diameter. There is granulation tissue present. Wound is probed to a depth of 5 mm. Wound is treated topically with silver nitrate. Antibiotic ointment and a dry gauze are placed.  IMPRESSION: Sebaceous cyst, right posterior neck, history of recent abscess, healing by secondary intention  PLAN: I have cauterized the wound with silver nitrate. I've asked the patient to apply topical antibiotic ointments 2-3 times daily for the next week. He will return in one week for a wound check. Once the wound is closed I believe he can undergo definitive excision as an outpatient procedure.  Velora Heckler, MD, FACS General & Endocrine Surgery [facility]

## 2012-02-08 ENCOUNTER — Encounter: Payer: Self-pay | Admitting: Cardiovascular Disease

## 2012-02-08 ENCOUNTER — Encounter: Payer: Self-pay | Admitting: Nurse Practitioner

## 2012-02-08 ENCOUNTER — Ambulatory Visit (INDEPENDENT_AMBULATORY_CARE_PROVIDER_SITE_OTHER): Payer: Medicare Other | Admitting: Cardiovascular Disease

## 2012-02-08 ENCOUNTER — Encounter: Payer: Self-pay | Admitting: Cardiothoracic Surgery

## 2012-02-08 VITALS — BP 159/86 | HR 83 | Ht 75.0 in | Wt 197.4 lb

## 2012-02-08 DIAGNOSIS — I10 Essential (primary) hypertension: Secondary | ICD-10-CM

## 2012-02-08 DIAGNOSIS — Z951 Presence of aortocoronary bypass graft: Secondary | ICD-10-CM

## 2012-02-08 DIAGNOSIS — R0989 Other specified symptoms and signs involving the circulatory and respiratory systems: Secondary | ICD-10-CM

## 2012-02-08 DIAGNOSIS — E785 Hyperlipidemia, unspecified: Secondary | ICD-10-CM

## 2012-02-08 NOTE — Assessment & Plan Note (Signed)
Lab Results  Component Value Date   CHOL 134 01/26/2011   HDL 53.30 01/26/2011   LDLCALC 64 01/26/2011   TRIG 84.0 01/26/2011   CHOLHDL 3 01/26/2011   His most recent lipid profile was optimal. Continue current treatment with Crestor 20 mg daily.

## 2012-02-08 NOTE — Progress Notes (Signed)
HPI  This is a 41 year old male who is here today for a followup visit. He is a previous patient of mine. I did cardiac catheterization in 2010 which showed significant three-vessel coronary artery disease. He underwent coronary artery bypass graft surgery in December of 2010 at [facility]. He had cardiac catheterization done in January of last year by Dr.Khan which showed patent grafts. His ejection fraction is normal. He has multiple chronic conditions that include end-stage renal disease and hemodialysis, diabetes, hypertension and hyperlipidemia. Overall, he has been doing reasonably well. He denies chest pain. He has chronic exertional dyspnea which has not worsened recently. His most recent echocardiogram was in June of last year which showed normal LV systolic function, mild to moderate tricuspid regurgitation and mild pulmonary hypertension. All his cardiac records over the last 3 years were reviewed.  Allergies  Allergen Reactions  . Augmentin Nausea And Vomiting  . Hydromorphone Hcl     "body started down"  . Rifampin Nausea And Vomiting     Current Outpatient Prescriptions on File Prior to Visit  Medication Sig Dispense Refill  . ALPRAZolam (XANAX) 0.5 MG tablet Take 1 tablet (0.5 mg total) by mouth 3 (three) times daily as needed for sleep or anxiety.  90 tablet  0  . aspirin 325 MG tablet Take 325 mg by mouth daily.        . carvedilol (COREG) 25 MG tablet Take 1 tablet (25 mg total) by mouth 2 (two) times daily with a meal.  180 tablet  3  . cinacalcet (SENSIPAR) 60 MG tablet Take 1 tablet (60 mg total) by mouth daily. With biggest meal  1 tablet  3  . furosemide (LASIX) 80 MG tablet Take 1 tablet (80 mg total) by mouth 2 (two) times daily.  60 tablet  6  . insulin aspart (NOVOLOG) 100 UNIT/ML injection Inject 0-20 Units into the skin 3 (three) times daily with meals. Before each meal 3 times a day, 140-199 - 2 units, 200-250 - 4 units, 250-299 - 6 units, 300-349 - 8 units,  350 or  above 10 units. Take along with premeal Novolog.  1 vial  1  . insulin aspart (NOVOLOG) 100 UNIT/ML injection Inject 6 Units into the skin 3 (three) times daily before meals. Plus the sliding scale  1 vial  0  . insulin glargine (LANTUS) 100 UNIT/ML injection Inject 14 Units into the skin 2 (two) times daily.  10 mL  1  . isosorbide mononitrate (IMDUR) 30 MG 24 hr tablet Take 1 tablet (30 mg total) by mouth 2 (two) times daily.  60 tablet  6  . levothyroxine (SYNTHROID, LEVOTHROID) 50 MCG tablet TAKE ONE TABLET BY MOUTH DAILY  30 tablet  6  . LYRICA 150 MG capsule TAKE ONE CAPSULE BY MOUTH TWICE DAILY  60 capsule  0  . mupirocin ointment (BACTROBAN) 2 % Apply 1 application topically daily.      . Omega-3 Fatty Acids (FISH OIL) 1000 MG CAPS Take 1,000 mg by mouth 1 dose over 46 hours.        [name] omeprazole (PRILOSEC) 40 MG capsule Take 1 capsule (40 mg total) by mouth daily.  30 capsule  6  . rosuvastatin (CRESTOR) 20 MG tablet Take 1 tablet (20 mg total) by mouth daily.  30 tablet  6     Past Medical History  Diagnosis Date  . End stage renal disease on dialysis     LUE fistula  . Diabetes mellitus  TYPE I  . Diabetic neuropathy     sever, s/p multiple toe amputation  . Thyroid disease     hypothyroidism  . Myocardial infarction   . Hypertension   . Hyperlipidemia   . MI (myocardial infarction)   . ESRD on hemodialysis   . Heart murmur   . Shortness of breath   . Hypothyroidism   . H/O hiatal hernia   . Neuromuscular disorder     Diabetic neuropathy  . GERD (gastroesophageal reflux disease)   . Anxiety   . Pneumonia   . Sebaceous cyst     side of neck  . Coronary artery disease 10/2009    CABG; Hx. of MI. Cardiac cath 11/2010: severe 3 vessel CAD, patent LIMA to LAD, SVG to OM1, SVG to LPDA and SVG to RCA     Past Surgical History  Procedure Date  . Dialysis fistula creation     left upper air fistula  . Amputation     toe  . Abscess drainage Behind right  ear/occipital scalp  . Skin graft   . Eye surgery 1999  . Coronary artery bypass graft 10/2009    [facility] (Dr. Katrinka Blazing)  . Cardiac catheterization      Family History  Problem Relation Age of Onset  . Heart disease Other   . Hypertension Other   . Hypertension Mother   . Heart disease Mother   . Diabetes Mother   . Birth defects Paternal Uncle     unaware  . Birth defects Paternal Grandmother     breast     History   Social History  . Marital Status: Single    Spouse Name: N/A    Number of Children: N/A  . Years of Education: N/A   Occupational History  . Not on file.   Social History Main Topics  . Smoking status: Never Smoker   . Smokeless tobacco: Former Neurosurgeon  . Alcohol Use: 0.0 oz/week     occasional beer  . Drug Use: No  . Sexually Active: Not Currently   Other Topics Concern  . Not on file   Social History Narrative  . No narrative on file     PHYSICAL EXAM   BP 159/86  Pulse 83  Ht 6\' 3"  (1.905 m)  Wt 197 lb 6.4 oz (89.54 kg)  BMI 24.67 kg/m2  Constitutional: He is oriented to person, place, and time. He appears well-developed and well-nourished. No distress.  HENT: No nasal discharge.  Head: Normocephalic and atraumatic.  Eyes: Pupils are equal and round. Right eye exhibits no discharge. Left eye exhibits no discharge.  Neck: Normal range of motion. Neck supple. No JVD present. No thyromegaly present. There is a loud left carotid bruit Cardiovascular: Normal rate, regular rhythm, normal heart sounds and. Exam reveals no gallop and no friction rub. There is a 1/6 systolic ejection murmur at the aortic area. Pulmonary/Chest: Effort normal and breath sounds normal. No stridor. No respiratory distress. He has no wheezes. He has no rales. He exhibits no tenderness.  Abdominal: Soft. Bowel sounds are normal. He exhibits no distension. There is no tenderness. There is no rebound and no guarding.  Musculoskeletal: Normal range of motion. He exhibits trace  edema and no tenderness.  Neurological: He is alert and oriented to person, place, and time. Coordination normal.  Skin: Skin is warm and dry. No rash noted. He is not diaphoretic. No erythema. No pallor.  Psychiatric: He has a normal mood and affect. His  behavior is normal. Judgment and thought content normal.      EKG: Normal sinus rhythm with incomplete right bundle branch block. Nonspecific T wave changes.   ASSESSMENT AND PLAN

## 2012-02-08 NOTE — Assessment & Plan Note (Signed)
The patient seems to be stable from a cardiac standpoint. He has no symptoms suggestive of angina. He had a cardiac catheterization done last year which showed patent grafts. Continue medical therapy.   

## 2012-02-08 NOTE — Assessment & Plan Note (Signed)
There is a loud left carotid bruit. This has not been investigated before. I recommend a carotid ultrasound Doppler. The patient is at risk for carotid stenosis.

## 2012-02-08 NOTE — Patient Instructions (Signed)
Your physician wants you to follow-up in: 6 MONTHS WITH DR. Kirke Corin. You will receive a reminder letter in the mail two months in advance. If you don't receive a letter, please call our office to schedule the follow-up appointment.   Your physician has requested that you have a BILATERAL carotid duplex DX BRUIT. This test is an ultrasound of the carotid arteries in your neck. It looks at blood flow through these arteries that supply the brain with blood. Allow one hour for this exam. There are no restrictions or special instructions.

## 2012-02-10 ENCOUNTER — Ambulatory Visit (INDEPENDENT_AMBULATORY_CARE_PROVIDER_SITE_OTHER): Payer: Medicare Other | Admitting: Surgery

## 2012-02-10 ENCOUNTER — Encounter (INDEPENDENT_AMBULATORY_CARE_PROVIDER_SITE_OTHER): Payer: Self-pay | Admitting: Surgery

## 2012-02-10 ENCOUNTER — Telehealth (INDEPENDENT_AMBULATORY_CARE_PROVIDER_SITE_OTHER): Payer: Self-pay | Admitting: Surgery

## 2012-02-10 ENCOUNTER — Encounter (HOSPITAL_COMMUNITY): Payer: Self-pay | Admitting: Pharmacy Technician

## 2012-02-10 ENCOUNTER — Telehealth: Payer: Self-pay | Admitting: Family Medicine

## 2012-02-10 VITALS — BP 124/76 | HR 78 | Temp 97.8°F | Resp 16 | Ht 75.0 in | Wt 196.2 lb

## 2012-02-10 DIAGNOSIS — L0390 Cellulitis, unspecified: Secondary | ICD-10-CM

## 2012-02-10 DIAGNOSIS — L0291 Cutaneous abscess, unspecified: Secondary | ICD-10-CM

## 2012-02-10 DIAGNOSIS — L723 Sebaceous cyst: Secondary | ICD-10-CM

## 2012-02-10 NOTE — Progress Notes (Signed)
Visit Diagnoses: 1. Abscess and cellulitis   2. Sebaceous cyst, neck     HISTORY: Patient is a 41 year old white male followed for a chronic draining abscess on the right posterior neck, likely arising from a sebaceous cyst. This is complicated by his insulin-dependent diabetes and underlying vascular disease. Patient returns today for wound check.  EXAM: Wound of the right posterior neck just at the hairline has not healed. There is still drainage. There is granulation tissue. The cyst debridement and cauterized with silver nitrate. Topical antibiotic ointment and a dry gauze dressing are applied. There is no sign of cellulitis.  IMPRESSION: Chronic draining cyst right posterior neck with history of abscess  PLAN: This wound does not appear to be healing. I believe it needs complete surgical excision. I discussed this with the patient. We discussed closure versus leaving the wound open. This will be decided at the time of the procedure.  I believe the patient can undergo the procedure as an outpatient. We will arrange for this at the hospital.  The risks and benefits of the procedure have been discussed at length with the patient.  The patient understands the proposed procedure, potential alternative treatments, and the course of recovery to be expected.  All of the patient's questions have been answered at this time.  The patient wishes to proceed with surgery.  Velora Heckler, MD, FACS General & Endocrine Surgery [facility]

## 2012-02-10 NOTE — Patient Instructions (Signed)
Apply antibiotic ointment twice daily and cover with gauze.  May shower.  tmg

## 2012-02-10 NOTE — Telephone Encounter (Signed)
Patients pharmacy called, regarding a tool he wanted for erectile dyfunction. Per Jacki Cones, pt will need an office visit per Dr. [name]. Call pt to schedule an appointment, says he would have to call us back.Marland KitchenMarland Kitchen

## 2012-02-10 NOTE — Telephone Encounter (Signed)
DUPLICATE MESSAGE

## 2012-02-11 ENCOUNTER — Encounter (INDEPENDENT_AMBULATORY_CARE_PROVIDER_SITE_OTHER): Payer: Medicare Other

## 2012-02-11 DIAGNOSIS — R0989 Other specified symptoms and signs involving the circulatory and respiratory systems: Secondary | ICD-10-CM

## 2012-02-12 ENCOUNTER — Telehealth (INDEPENDENT_AMBULATORY_CARE_PROVIDER_SITE_OTHER): Payer: Self-pay

## 2012-02-12 ENCOUNTER — Encounter: Payer: Self-pay | Admitting: Cardiovascular Disease

## 2012-02-12 ENCOUNTER — Encounter (HOSPITAL_COMMUNITY)
Admission: RE | Admit: 2012-02-12 | Discharge: 2012-02-12 | Disposition: A | Discharge status: Home / Self Care | Payer: Medicare Other | Source: Ambulatory Visit | Attending: Surgery | Admitting: Surgery

## 2012-02-12 DIAGNOSIS — Z01818 Encounter for other preprocedural examination: Secondary | ICD-10-CM | POA: Insufficient documentation

## 2012-02-12 HISTORY — DX: Peripheral vascular disease, unspecified: I73.9

## 2012-02-12 HISTORY — DX: Anemia, unspecified: D64.9

## 2012-02-12 NOTE — Patient Instructions (Signed)
YOUR SURGERY IS SCHEDULED ON:  Thursday  4/11  AT 11:35 AM  REPORT TO [place] SHORT STAY CENTER AT:  9:00 AM      PHONE # FOR SHORT STAY IS [phone]  DO NOT EAT OR DRINK ANYTHING AFTER MIDNIGHT THE NIGHT BEFORE YOUR SURGERY.  YOU MAY BRUSH YOUR TEETH, RINSE OUT YOUR MOUTH--BUT NO WATER, NO FOOD, NO CHEWING GUM, NO MINTS, NO CANDIES, NO CHEWING TOBACCO.  PLEASE TAKE THE FOLLOWING MEDICATIONS THE AM OF YOUR SURGERY WITH A FEW SIPS OF WATER: CARVEDILOL, ISOSORBIDE, LYRICA, OMEPRAZOLE, SYNTHROID    IF YOU USE INHALERS--USE YOUR INHALERS THE AM OF YOUR SURGERY AND BRING INHALERS TO THE HOSPITAL -TAKE TO SURGERY.    IF YOU ARE DIABETIC:  DO NOT TAKE ANY DIABETIC MEDICATIONS THE AM OF YOUR SURGERY.  IF YOU TAKE INSULIN IN THE EVENINGS--PLEASE ONLY TAKE 1/2 NORMAL EVENING DOSE THE NIGHT BEFORE YOUR SURGERY.  NO INSULIN THE AM OF YOUR SURGERY.  IF YOU HAVE SLEEP APNEA AND USE CPAP OR BIPAP--PLEASE BRING THE MASK --NOT THE MACHINE-NOT THE TUBING   -JUST THE MASK. DO NOT BRING VALUABLES, MONEY, CREDIT CARDS.  CONTACT LENS, DENTURES / PARTIALS, GLASSES SHOULD NOT BE WORN TO SURGERY AND IN MOST CASES-HEARING AIDS WILL NEED TO BE REMOVED.  BRING YOUR GLASSES CASE, ANY EQUIPMENT NEEDED FOR YOUR CONTACT LENS. FOR PATIENTS ADMITTED TO THE HOSPITAL--CHECK OUT TIME THE DAY OF DISCHARGE IS 11:00 AM.  ALL INPATIENT ROOMS ARE PRIVATE - WITH BATHROOM, TELEPHONE, TELEVISION AND WIFI INTERNET. IF YOU ARE BEING DISCHARGED THE SAME DAY OF YOUR SURGERY--YOU CAN NOT DRIVE YOURSELF HOME--AND SHOULD NOT GO HOME ALONE BY TAXI OR BUS.  NO DRIVING OR OPERATING MACHINERY FOR 24 HOURS FOLLOWING ANESTHESIA / PAIN MEDICATIONS.                            SPECIAL INSTRUCTIONS:  CHLORHEXIDINE SOAP SHOWER (other brand names are Betasept and Hibiclens ) PLEASE SHOWER WITH CHLORHEXIDINE THE NIGHT BEFORE YOUR SURGERY AND THE AM OF YOUR SURGERY. DO NOT USE CHLORHEXIDINE ON YOUR FACE OR PRIVATE AREAS--YOU MAY USE YOUR NORMAL SOAP  THOSE AREAS AND YOUR NORMAL SHAMPOO.  WOMEN SHOULD AVOID SHAVING UNDER ARMS AND SHAVING LEGS 48 HOURS BEFORE USING CHLORHEXIDINE TO AVOID SKIN IRRITATION.  DO NOT USE IF ALLERGIC TO CHLORHEXIDINE.  PLEASE READ OVER ANY  FACT SHEETS THAT YOU WERE GIVEN: MRSA INFORMATION

## 2012-02-12 NOTE — Pre-Procedure Instructions (Signed)
DR. Acey Lav NOTIFIED PT IS DIALYSIS PT--DOES PT NEED PREOP LABS DRAWN TODAY OR WAIT UNTIL THE DAY OF HIS SURGERY.  DR. Acey Lav SAID TO WAIT UNTIL DAY OF SURGERY AND CBC AND BMET WOULD BE NEEDED.  DR. Acey Lav NOTIFIED THAT PT'S CXR REPORT 01/04/2012 FROM [facility] SAYS "MILD CARDIOMEGALY, MILD INTERSTITIAL EDEMA AND TINY PLEURAL EFFUSIONS" --PT HAD COUGH AT THE TIME XRAY WAS DONE--BUT NOT C/O OF COUGH OR CONGESTION NOW.  DR. Acey Lav OK'D CXR REPORT FOR PT'S SURGERY. PT HAS EKG AND CARDIOLOGY OFFICE NOTE 02/08/2012 FROM DR. [facility] IN [place]. [facility] AT DR. GERKIN'S OFFICE NOTIFIED THAT PT SAID HE IS TO HAVE OP BALLOON ANGIOPLASTY OF 2 BLOCKAGES IN HIS RIGHT LEG ON WED 02/17/12 - THE DAY BEFORE HIS PLANNED EXCISION OF NECK CYST WITH DR. GERKIN.

## 2012-02-12 NOTE — Telephone Encounter (Signed)
Taylor [name] Bible called to inform us the pt is having balloon angioplasty on 2 blockages of the right leg on 4/10 by a Vascular Surgeon at [facility].  His surgery here is 4/11 for excision of a neck cyst.

## 2012-02-14 NOTE — Telephone Encounter (Signed)
If they are going to anticoagulate him after his angioplasty, then we will have to reschedule his excision of cyst from the neck.  Check with patient or with vascular surgeon in [place], please. Thanks, Velora Heckler, MD, [facility] Office: [phone]

## 2012-02-15 ENCOUNTER — Encounter (HOSPITAL_COMMUNITY): Payer: Self-pay

## 2012-02-15 NOTE — Telephone Encounter (Signed)
I called the patient and he is not going to be on any blood thinners after the procedure.  He usually takes aspirin but has stopped that for surgery.

## 2012-02-17 LAB — CBC
MCH: 29.3 pg (ref 26.0–34.0)
MCHC: 32.5 g/dL (ref 32.0–36.0)
MCV: 90 fL (ref 80–100)
Platelet: 192 10*3/uL (ref 150–440)
RBC: 3.13 10*6/uL — ABNORMAL LOW (ref 4.40–5.90)
RDW: 16.3 % — ABNORMAL HIGH (ref 11.5–14.5)
WBC: 7.1 10*3/uL (ref 3.8–10.6)

## 2012-02-17 LAB — BASIC METABOLIC PANEL
Anion Gap: 12 (ref 7–16)
BUN: 47 mg/dL — ABNORMAL HIGH (ref 7–18)
Calcium, Total: 9.2 mg/dL (ref 8.5–10.1)
EGFR (African American): 16 — ABNORMAL LOW
EGFR (Non-African Amer.): 13 — ABNORMAL LOW
Osmolality: 291 (ref 275–301)
Potassium: 4.3 mmol/L (ref 3.5–5.1)

## 2012-02-18 ENCOUNTER — Inpatient Hospital Stay: Payer: Self-pay | Admitting: Internal Medicine

## 2012-02-18 ENCOUNTER — Encounter (HOSPITAL_COMMUNITY): Admission: RE | Payer: Self-pay | Source: Ambulatory Visit

## 2012-02-18 ENCOUNTER — Ambulatory Visit (HOSPITAL_COMMUNITY): Admission: RE | Admit: 2012-02-18 | Payer: Medicare Other | Source: Ambulatory Visit | Admitting: Surgery

## 2012-02-18 LAB — SEDIMENTATION RATE: Erythrocyte Sed Rate: >140 mm/hr — ABNORMAL HIGH (ref 0–15)

## 2012-02-18 SURGERY — EXCISION, CYST, NECK
Anesthesia: Monitor Anesthesia Care | Laterality: Right

## 2012-02-19 LAB — CLOSTRIDIUM DIFFICILE BY PCR

## 2012-02-20 ENCOUNTER — Other Ambulatory Visit: Payer: Self-pay | Admitting: Cardiovascular Disease

## 2012-02-20 ENCOUNTER — Other Ambulatory Visit: Payer: Self-pay | Admitting: Family Medicine

## 2012-02-20 LAB — WBCS, STOOL

## 2012-02-20 LAB — RENAL FUNCTION PANEL
Albumin: 2.2 g/dL — ABNORMAL LOW (ref 3.4–5.0)
Chloride: 95 mmol/L — ABNORMAL LOW (ref 98–107)
Creatinine: 5.14 mg/dL — ABNORMAL HIGH (ref 0.60–1.30)
EGFR (Non-African Amer.): 13 — ABNORMAL LOW
Glucose: 212 mg/dL — ABNORMAL HIGH (ref 65–99)
Osmolality: 285 (ref 275–301)
Sodium: 134 mmol/L — ABNORMAL LOW (ref 136–145)

## 2012-02-20 LAB — CBC WITH DIFFERENTIAL/PLATELET
Eosinophil #: 0.3 10*3/uL (ref 0.0–0.7)
MCV: 90 fL (ref 80–100)
Monocyte #: 0.4 x10 3/mm (ref 0.2–1.0)
Monocyte %: 6.5 %
Neutrophil %: 76.8 %
RBC: 2.74 10*6/uL — ABNORMAL LOW (ref 4.40–5.90)
RDW: 15.9 % — ABNORMAL HIGH (ref 11.5–14.5)
WBC: 6.6 10*3/uL (ref 3.8–10.6)

## 2012-02-21 LAB — CBC WITH DIFFERENTIAL/PLATELET
Basophil #: 0.1 10*3/uL (ref 0.0–0.1)
HCT: 24.6 % — ABNORMAL LOW (ref 40.0–52.0)
HGB: 8.1 g/dL — ABNORMAL LOW (ref 13.0–18.0)
Lymphocyte #: 1.3 10*3/uL (ref 1.0–3.6)
Lymphocyte %: 15.3 %
MCH: 29.6 pg (ref 26.0–34.0)
MCHC: 33 g/dL (ref 32.0–36.0)
Monocyte %: 6.5 %
Neutrophil #: 6 10*3/uL (ref 1.4–6.5)
Neutrophil %: 73.1 %
Platelet: 220 10*3/uL (ref 150–440)
RDW: 16.5 % — ABNORMAL HIGH (ref 11.5–14.5)
WBC: 8.2 10*3/uL (ref 3.8–10.6)

## 2012-02-21 LAB — BASIC METABOLIC PANEL
Anion Gap: 11 (ref 7–16)
BUN: 24 mg/dL — ABNORMAL HIGH (ref 7–18)
Calcium, Total: 8 mg/dL — ABNORMAL LOW (ref 8.5–10.1)
Chloride: 96 mmol/L — ABNORMAL LOW (ref 98–107)
Creatinine: 3.92 mg/dL — ABNORMAL HIGH (ref 0.60–1.30)
Glucose: 285 mg/dL — ABNORMAL HIGH (ref 65–99)
Sodium: 135 mmol/L — ABNORMAL LOW (ref 136–145)

## 2012-02-22 LAB — BASIC METABOLIC PANEL
Anion Gap: 11 (ref 7–16)
Calcium, Total: 8.9 mg/dL (ref 8.5–10.1)
Chloride: 95 mmol/L — ABNORMAL LOW (ref 98–107)
Creatinine: 5.13 mg/dL — ABNORMAL HIGH (ref 0.60–1.30)
EGFR (African American): 15 — ABNORMAL LOW
Glucose: 270 mg/dL — ABNORMAL HIGH (ref 65–99)
Osmolality: 287 (ref 275–301)
Potassium: 4.3 mmol/L (ref 3.5–5.1)

## 2012-02-22 LAB — WOUND CULTURE

## 2012-02-22 NOTE — Telephone Encounter (Signed)
Medicine called to [facility]. 

## 2012-02-23 LAB — RENAL FUNCTION PANEL
Albumin: 2.3 g/dL — ABNORMAL LOW (ref 3.4–5.0)
Anion Gap: 16 (ref 7–16)
BUN: 58 mg/dL — ABNORMAL HIGH (ref 7–18)
Chloride: 99 mmol/L (ref 98–107)
Co2: 23 mmol/L (ref 21–32)
Creatinine: 6.23 mg/dL — ABNORMAL HIGH (ref 0.60–1.30)
EGFR (African American): 12 — ABNORMAL LOW
EGFR (Non-African Amer.): 10 — ABNORMAL LOW
Glucose: 292 mg/dL — ABNORMAL HIGH (ref 65–99)
Potassium: 4.9 mmol/L (ref 3.5–5.1)
Sodium: 138 mmol/L (ref 136–145)

## 2012-02-23 LAB — VANCOMYCIN, TROUGH: Vancomycin, Trough: 17 ug/mL (ref 10–20)

## 2012-02-23 LAB — CULTURE, BLOOD (SINGLE)

## 2012-02-25 LAB — PATHOLOGY REPORT

## 2012-03-01 ENCOUNTER — Encounter (INDEPENDENT_AMBULATORY_CARE_PROVIDER_SITE_OTHER): Payer: Medicare Other | Admitting: Surgery

## 2012-04-18 ENCOUNTER — Encounter: Payer: Self-pay | Admitting: Family Medicine

## 2012-04-18 ENCOUNTER — Ambulatory Visit (INDEPENDENT_AMBULATORY_CARE_PROVIDER_SITE_OTHER): Payer: Medicare Other | Admitting: Family Medicine

## 2012-04-18 VITALS — BP 164/90 | HR 80 | Temp 97.7°F | Wt 200.0 lb

## 2012-04-18 DIAGNOSIS — J189 Pneumonia, unspecified organism: Secondary | ICD-10-CM | POA: Insufficient documentation

## 2012-04-18 MED ORDER — MOXIFLOXACIN HCL 400 MG PO TABS: 400.0000 mg | ORAL_TABLET | Freq: Every day | ORAL | Status: AC

## 2012-04-18 MED ORDER — HYDROCOD POLST-CHLORPHEN POLST 10-8 MG/5ML PO LQCR: 5.0000 mL | Freq: Every evening | ORAL | Status: DC | PRN

## 2012-04-18 MED ORDER — ALBUTEROL SULFATE HFA 108 (90 BASE) MCG/ACT IN AERS: 2.0000 | INHALATION_SPRAY | Freq: Four times a day (QID) | RESPIRATORY_TRACT | Status: DC | PRN

## 2012-04-18 NOTE — Progress Notes (Signed)
Patient ID: Taylor [name], male    DOB: 02-11-1971, 41 y.o.   MRN: [id number]  HPI Comments: Taylor [name] is a pleasant Gentleman with history of coronary artery disease, bypass surgery in December 2010 at [facility], poorly controlled diabetes, hypertension, end-stage renal disease on dialysis on Tuesday, Thursday and Saturday here for over 2 weeks of URI symptoms.  Recently had to have more toe amputations, on and off abx constantly.  Sugars have actually been much improved!  a1c now 9.0!  Past two weeks- nasal congestion, sinus pressure, productive cough. Some DOE.  No CP.  Taking Mucinex and Robitussin with no relief of symptoms.  Chills but no fever. No n/v/d.  Patient Active Problem List  Diagnoses  . HYPOTHYROIDISM  . DIABETES MELLITUS, TYPE I  . HYPERLIPIDEMIA  . MYOCARDIAL INFARCTION, HX OF  . End stage renal disease  . S/P CABG (coronary artery bypass graft)  . HTN (hypertension)  . Scrotal abscess  . DKA (diabetic ketoacidoses)  . Panic attacks  . Abscess and cellulitis  . Diabetic hyperosmolar non-ketotic state  . Type 1 diabetes mellitus with diabetic foot ulcer  . Diabetic peripheral neuropathy  . Hyponatremia  . Sebaceous cyst, neck  . Left carotid bruit  . PNA (pneumonia)   Past Medical History  Diagnosis Date  . End stage renal disease on dialysis     LUE fistula  . Diabetes mellitus     TYPE I  . Diabetic neuropathy     sever, s/p multiple toe amputation  . Thyroid disease     hypothyroidism  . Myocardial infarction   . Hypertension   . Hyperlipidemia   . MI (myocardial infarction)   . ESRD on hemodialysis   . Heart murmur   . Hypothyroidism   . H/O hiatal hernia   . Neuromuscular disorder     Diabetic neuropathy  . GERD (gastroesophageal reflux disease)   . Anxiety   . Sebaceous cyst     side of neck  . Shortness of breath     WITH EXERTION  OR FLUID RETENTION  . Pneumonia     2010  . Anemia   . Blood transfusion     2011  .  PAD (peripheral artery disease)     pt states he is scheduled for balloon angioplasty of 2 blockages in his rt leg on Wed 02/1012 at  [facility].  . Coronary artery disease 10/2009    CABG; Hx. of MI. Cardiac cath 11/2010: severe 3 vessel CAD, patent LIMA to LAD, SVG to OM1, SVG to LPDA and SVG to RCA.  PT'S CARDIOLOGIST IS DR. Kirke Corin WITH [facility] IN [place]   Past Surgical History  Procedure Date  . Dialysis fistula creation     left upper arm fistula  . Amputation     TOES ON BOTH FEET  . Abscess drainage Behind right ear/occipital scalp  . Skin graft   . Eye surgery 1999  . Cardiac catheterization   . Coronary artery bypass graft 10/2009    [facility] (Dr. Katrinka Blazing)   History  Substance Use Topics  . Smoking status: Never Smoker   . Smokeless tobacco: Former Neurosurgeon  . Alcohol Use: 0.0 oz/week     occasional beer   Family History  Problem Relation Age of Onset  . Heart disease Other   . Hypertension Other   . Hypertension Mother   . Heart disease Mother   . Diabetes Mother   . Birth  defects Paternal Uncle     unaware  . Birth defects Paternal Grandmother     breast   Allergies  Allergen Reactions  . Amoxicillin-Pot Clavulanate Nausea And Vomiting  . Hydromorphone Hcl     "body started down"  . Rifampin Nausea And Vomiting   Current Outpatient Prescriptions on File Prior to Visit  Medication Sig Dispense Refill  . ALPRAZolam (XANAX) 0.5 MG tablet Take 1 tablet (0.5 mg total) by mouth 3 (three) times daily as needed for sleep or anxiety.  90 tablet  0  . aspirin 325 MG tablet Take 325 mg by mouth daily with breakfast.       . carvedilol (COREG) 25 MG tablet Take 1 tablet (25 mg total) by mouth 2 (two) times daily with a meal.  180 tablet  3  . cinacalcet (SENSIPAR) 60 MG tablet Take 60 mg by mouth daily at 12 noon. With biggest meal      . furosemide (LASIX) 80 MG tablet Take 1 tablet (80 mg total) by mouth 2 (two) times daily.  60 tablet  6  .  insulin aspart (NOVOLOG) 100 UNIT/ML injection Inject 0-10 Units into the skin 3 (three) times daily with meals. Before each meal 3 times a day, 140-199 - 2 units, 200-250 - 4 units, 250-299 - 6 units, 300-349 - 8 units,  350 or above 10 units. Take along with premeal Novolog.      . insulin aspart (NOVOLOG) 100 UNIT/ML injection Inject 7 Units into the skin 3 (three) times daily before meals. Plus the sliding scale      . insulin glargine (LANTUS) 100 UNIT/ML injection Inject 7 Units into the skin 2 (two) times daily.      . isosorbide mononitrate (IMDUR) 30 MG 24 hr tablet Take 1 tablet (30 mg total) by mouth 2 (two) times daily.  60 tablet  6  . levothyroxine (SYNTHROID, LEVOTHROID) 75 MCG tablet Take 75 mcg by mouth daily before breakfast.      . LYRICA 150 MG capsule TAKE ONE CAPSULE BY MOUTH TWICE DAILY  60 capsule  0  . Omega-3 Fatty Acids (FISH OIL) 1000 MG CAPS Take 1,000 mg by mouth daily.       [name] omeprazole (PRILOSEC) 40 MG capsule TAKE ONE CAPSULE BY MOUTH EVERY DAY  30 capsule  5  . rosuvastatin (CRESTOR) 20 MG tablet Take 20 mg by mouth at bedtime.      [name] albuterol (PROVENTIL HFA;VENTOLIN HFA) 108 (90 BASE) MCG/ACT inhaler Inhale 2 puffs into the lungs every 6 (six) hours as needed for wheezing.  1 Inhaler  0   The PMH, PSH, Social History, Family History, Medications, and allergies have been reviewed in [facility], and have been updated if relevant.   ROS: See HPI  Patient Active Problem List  Diagnoses  . HYPOTHYROIDISM  . DIABETES MELLITUS, TYPE I  . HYPERLIPIDEMIA  . MYOCARDIAL INFARCTION, HX OF  . End stage renal disease  . S/P CABG (coronary artery bypass graft)  . HTN (hypertension)  . Scrotal abscess  . DKA (diabetic ketoacidoses)  . Panic attacks  . Abscess and cellulitis  . Diabetic hyperosmolar non-ketotic state  . Type 1 diabetes mellitus with diabetic foot ulcer  . Diabetic peripheral neuropathy  . Hyponatremia  . Sebaceous cyst, neck  . Left carotid bruit  .  PNA (pneumonia)   Past Medical History  Diagnosis Date  . End stage renal disease on dialysis     LUE fistula  .  Diabetes mellitus     TYPE I  . Diabetic neuropathy     sever, s/p multiple toe amputation  . Thyroid disease     hypothyroidism  . Myocardial infarction   . Hypertension   . Hyperlipidemia   . MI (myocardial infarction)   . ESRD on hemodialysis   . Heart murmur   . Hypothyroidism   . H/O hiatal hernia   . Neuromuscular disorder     Diabetic neuropathy  . GERD (gastroesophageal reflux disease)   . Anxiety   . Sebaceous cyst     side of neck  . Shortness of breath     WITH EXERTION  OR FLUID RETENTION  . Pneumonia     2010  . Anemia   . Blood transfusion     2011  . PAD (peripheral artery disease)     pt states he is scheduled for balloon angioplasty of 2 blockages in his rt leg on Wed 02/1012 at  [facility].  . Coronary artery disease 10/2009    CABG; Hx. of MI. Cardiac cath 11/2010: severe 3 vessel CAD, patent LIMA to LAD, SVG to OM1, SVG to LPDA and SVG to RCA.  PT'S CARDIOLOGIST IS DR. Kirke Corin WITH [facility] IN [place]   Past Surgical History  Procedure Date  . Dialysis fistula creation     left upper arm fistula  . Amputation     TOES ON BOTH FEET  . Abscess drainage Behind right ear/occipital scalp  . Skin graft   . Eye surgery 1999  . Cardiac catheterization   . Coronary artery bypass graft 10/2009    [facility] (Dr. Katrinka Blazing)   History  Substance Use Topics  . Smoking status: Never Smoker   . Smokeless tobacco: Former Neurosurgeon  . Alcohol Use: 0.0 oz/week     occasional beer   Family History  Problem Relation Age of Onset  . Heart disease Other   . Hypertension Other   . Hypertension Mother   . Heart disease Mother   . Diabetes Mother   . Birth defects Paternal Uncle     unaware  . Birth defects Paternal Grandmother     breast   Allergies  Allergen Reactions  . Amoxicillin-Pot Clavulanate Nausea And Vomiting  .  Hydromorphone Hcl     "body started down"  . Rifampin Nausea And Vomiting   Current Outpatient Prescriptions on File Prior to Visit  Medication Sig Dispense Refill  . ALPRAZolam (XANAX) 0.5 MG tablet Take 1 tablet (0.5 mg total) by mouth 3 (three) times daily as needed for sleep or anxiety.  90 tablet  0  . aspirin 325 MG tablet Take 325 mg by mouth daily with breakfast.       . carvedilol (COREG) 25 MG tablet Take 1 tablet (25 mg total) by mouth 2 (two) times daily with a meal.  180 tablet  3  . cinacalcet (SENSIPAR) 60 MG tablet Take 60 mg by mouth daily at 12 noon. With biggest meal      . furosemide (LASIX) 80 MG tablet Take 1 tablet (80 mg total) by mouth 2 (two) times daily.  60 tablet  6  . insulin aspart (NOVOLOG) 100 UNIT/ML injection Inject 0-10 Units into the skin 3 (three) times daily with meals. Before each meal 3 times a day, 140-199 - 2 units, 200-250 - 4 units, 250-299 - 6 units, 300-349 - 8 units,  350 or above 10 units. Take along with premeal Novolog.      [name]  insulin aspart (NOVOLOG) 100 UNIT/ML injection Inject 7 Units into the skin 3 (three) times daily before meals. Plus the sliding scale      . insulin glargine (LANTUS) 100 UNIT/ML injection Inject 7 Units into the skin 2 (two) times daily.      . isosorbide mononitrate (IMDUR) 30 MG 24 hr tablet Take 1 tablet (30 mg total) by mouth 2 (two) times daily.  60 tablet  6  . levothyroxine (SYNTHROID, LEVOTHROID) 75 MCG tablet Take 75 mcg by mouth daily before breakfast.      . LYRICA 150 MG capsule TAKE ONE CAPSULE BY MOUTH TWICE DAILY  60 capsule  0  . Omega-3 Fatty Acids (FISH OIL) 1000 MG CAPS Take 1,000 mg by mouth daily.       [name] omeprazole (PRILOSEC) 40 MG capsule TAKE ONE CAPSULE BY MOUTH EVERY DAY  30 capsule  5  . rosuvastatin (CRESTOR) 20 MG tablet Take 20 mg by mouth at bedtime.      [name] albuterol (PROVENTIL HFA;VENTOLIN HFA) 108 (90 BASE) MCG/ACT inhaler Inhale 2 puffs into the lungs every 6 (six) hours as needed for  wheezing.  1 Inhaler  0     The PMH, PSH, Social History, Family History, Medications, and allergies have been reviewed in [facility], and have been updated if relevant.   Physical Exam  BP 164/90  Pulse 80  Temp(Src) 97.7 F (36.5 C) (Oral)  Wt 200 lb (90.719 kg)  Constitutional: He is oriented to person, place, and time. He appears well-developed, appears weaker than usual, pale and HYPERTENSIVE. HENT:  Head: Normocephalic.  Nose: boggy turbinates, sinuses NTTP  Cardiovascular: Normal rate, regular rhythm, S1 normal, S2 normal and intact distal pulses.  Exam reveals no gallop and no friction rub.   Murmur heard.  Systolic murmur is present with a grade of 2/6  Pulmonary/Chest: No respiratory distress. He has no wheezes.  Left sided crackles Neurological: He is alert and oriented to person, place, and time. Coordination normal.     Psychiatric: He has a normal mood and affect. His behavior is normal. Judgment and thought content normal.         Assessment and Plan   1. PNA (pneumonia)    New- discussed with pt and his wife. Given his complicated history and lung sounds on exam, will treat for presumed PNA. Defer CXR at this time as it will not change management. Avelox 400 mg daily x 7 days, albuterol and tussionex as needed for cough (states he is NOT allergic). If no improvement or worsening symptoms, consider imaging. The patient indicates understanding of these issues and agrees with the plan.

## 2012-04-18 NOTE — Patient Instructions (Signed)
Start the Avelox as directed- 7 day course. Call me if no improvement with next few days.  Treat sympotmatically with Mucinex, nasal saline irrigation, and Tylenol/Ibuprofen.   Cough suppressant at night.

## 2012-06-23 ENCOUNTER — Other Ambulatory Visit: Payer: Self-pay | Admitting: Family Medicine

## 2012-06-24 NOTE — Telephone Encounter (Signed)
Medicine called to pharmacy. 

## 2012-07-26 ENCOUNTER — Other Ambulatory Visit: Payer: Self-pay | Admitting: Family Medicine

## 2012-07-27 ENCOUNTER — Other Ambulatory Visit: Payer: Self-pay | Admitting: Family Medicine

## 2012-07-27 NOTE — Telephone Encounter (Signed)
Medicine called to [facility]. 

## 2012-07-27 NOTE — Telephone Encounter (Signed)
Dr [name], this refill was called to pharmacy this morning for a one month supply.  Patient is asking for a 3 month supply- OK to change?

## 2012-07-27 NOTE — Telephone Encounter (Signed)
Medicine called to pharmacy. 

## 2012-08-01 ENCOUNTER — Other Ambulatory Visit: Payer: Self-pay | Admitting: *Deleted

## 2012-08-01 MED ORDER — FUROSEMIDE 80 MG PO TABS: 80.0000 mg | ORAL_TABLET | Freq: Two times a day (BID) | ORAL | Status: DC

## 2012-08-08 ENCOUNTER — Encounter: Payer: Self-pay | Admitting: Cardiovascular Disease

## 2012-08-08 ENCOUNTER — Ambulatory Visit (INDEPENDENT_AMBULATORY_CARE_PROVIDER_SITE_OTHER): Payer: Medicare Other | Admitting: Cardiovascular Disease

## 2012-08-08 VITALS — BP 174/92 | HR 82 | Ht 75.0 in | Wt 206.5 lb

## 2012-08-08 DIAGNOSIS — R0989 Other specified symptoms and signs involving the circulatory and respiratory systems: Secondary | ICD-10-CM

## 2012-08-08 DIAGNOSIS — R0602 Shortness of breath: Secondary | ICD-10-CM

## 2012-08-08 DIAGNOSIS — Z951 Presence of aortocoronary bypass graft: Secondary | ICD-10-CM

## 2012-08-08 DIAGNOSIS — E785 Hyperlipidemia, unspecified: Secondary | ICD-10-CM

## 2012-08-08 DIAGNOSIS — I10 Essential (primary) hypertension: Secondary | ICD-10-CM

## 2012-08-08 NOTE — Progress Notes (Signed)
HPI  This is a 41 year old male who is here today for a followup visit. He has known history of three-vessel coronary artery disease diagnosed in 2010. He underwent coronary artery bypass graft surgery in December of 2010 at [facility]. He had cardiac catheterization done in January of 2012 by Dr.Khan which showed patent grafts. His ejection fraction is normal. He has multiple chronic conditions that include end-stage renal disease and hemodialysis, diabetes, hypertension and hyperlipidemia. Overall, he has been doing reasonably well. He denies chest pain. He has chronic exertional dyspnea which has not worsened recently. His most recent echocardiogram was in June of last year which showed normal LV systolic function, mild to moderate tricuspid regurgitation and mild pulmonary hypertension.  He also has peripheral arterial disease being managed by Dr. Gilda Crease. He underwent right toes amputation recently. His blood pressure tends to run high on the days that he doesn't have dialysis. He usually develops hypotension during dialysis.   Allergies  Allergen Reactions  . Amoxicillin-Pot Clavulanate Nausea And Vomiting  . Hydromorphone Hcl     "body started down"  . Rifampin Nausea And Vomiting     Current Outpatient Prescriptions on File Prior to Visit  Medication Sig Dispense Refill  . albuterol (PROVENTIL HFA;VENTOLIN HFA) 108 (90 BASE) MCG/ACT inhaler Inhale 2 puffs into the lungs every 6 (six) hours as needed for wheezing.  1 Inhaler  0  . aspirin 325 MG tablet Take 325 mg by mouth daily with breakfast.       . carvedilol (COREG) 25 MG tablet Take 1 tablet (25 mg total) by mouth 2 (two) times daily with a meal.  180 tablet  3  . chlorpheniramine-HYDROcodone (TUSSIONEX PENNKINETIC ER) 10-8 MG/5ML LQCR Take 5 mLs by mouth at bedtime as needed.  140 mL  0  . cinacalcet (SENSIPAR) 60 MG tablet Take 60 mg by mouth daily at 12 noon. With biggest meal      . furosemide (LASIX) 80 MG tablet Take 1 tablet  (80 mg total) by mouth 2 (two) times daily.  60 tablet  6  . insulin aspart (NOVOLOG) 100 UNIT/ML injection Inject 0-10 Units into the skin 3 (three) times daily with meals. Before each meal 3 times a day, 140-199 - 2 units, 200-250 - 4 units, 250-299 - 6 units, 300-349 - 8 units,  350 or above 10 units. Take along with premeal Novolog.      . insulin aspart (NOVOLOG) 100 UNIT/ML injection Inject 7 Units into the skin 3 (three) times daily before meals. Plus the sliding scale      . insulin glargine (LANTUS) 100 UNIT/ML injection Inject 7 Units into the skin 2 (two) times daily.      . isosorbide mononitrate (IMDUR) 30 MG 24 hr tablet Take 1 tablet (30 mg total) by mouth 2 (two) times daily.  60 tablet  6  . levothyroxine (SYNTHROID, LEVOTHROID) 75 MCG tablet Take 75 mcg by mouth daily before breakfast.      . LYRICA 150 MG capsule TAKE 1 CAPSULE BY MOUTH TWICE DAILY  180 each  0  . Omega-3 Fatty Acids (FISH OIL) 1000 MG CAPS Take 1,000 mg by mouth daily.       [name] omeprazole (PRILOSEC) 40 MG capsule TAKE ONE CAPSULE BY MOUTH EVERY DAY  30 capsule  5  . rosuvastatin (CRESTOR) 20 MG tablet Take 20 mg by mouth at bedtime.         Past Medical History  Diagnosis Date  .  End stage renal disease on dialysis     LUE fistula  . Diabetes mellitus     TYPE I  . Diabetic neuropathy     sever, s/p multiple toe amputation  . Thyroid disease     hypothyroidism  . Myocardial infarction   . Hypertension   . Hyperlipidemia   . MI (myocardial infarction)   . ESRD on hemodialysis   . Heart murmur   . Hypothyroidism   . H/O hiatal hernia   . Neuromuscular disorder     Diabetic neuropathy  . GERD (gastroesophageal reflux disease)   . Anxiety   . Sebaceous cyst     side of neck  . Shortness of breath     WITH EXERTION  OR FLUID RETENTION  . Pneumonia     2010  . Anemia   . Blood transfusion     2011  . PAD (peripheral artery disease)     pt states he is scheduled for balloon angioplasty of 2  blockages in his rt leg on Wed 02/1012 at  [facility].  . Coronary artery disease 10/2009    CABG; Hx. of MI. Cardiac cath 11/2010: severe 3 vessel CAD, patent LIMA to LAD, SVG to OM1, SVG to LPDA and SVG to RCA.  PT'S CARDIOLOGIST IS DR. Kirke Corin WITH [facility] IN [place]  . Cataract     right     Past Surgical History  Procedure Date  . Dialysis fistula creation     left upper arm fistula  . Amputation     TOES ON BOTH FEET  . Abscess drainage Behind right ear/occipital scalp  . Skin graft   . Eye surgery 1999  . Cardiac catheterization   . Coronary artery bypass graft 10/2009    [facility] (Dr. Katrinka Blazing)  . Foot amputation      Family History  Problem Relation Age of Onset  . Heart disease Other   . Hypertension Other   . Hypertension Mother   . Heart disease Mother   . Diabetes Mother   . Birth defects Paternal Uncle     unaware  . Birth defects Paternal Grandmother     breast     History   Social History  . Marital Status: Single    Spouse Name: N/A    Number of Children: N/A  . Years of Education: N/A   Occupational History  . Not on file.   Social History Main Topics  . Smoking status: Never Smoker   . Smokeless tobacco: Former Neurosurgeon  . Alcohol Use: 0.0 oz/week     occasional beer  . Drug Use: No  . Sexually Active: Not Currently   Other Topics Concern  . Not on file   Social History Narrative  . No narrative on file     PHYSICAL EXAM   BP 174/92  Pulse 82  Ht 6\' 3"  (1.905 m)  Wt 206 lb 8 oz (93.668 kg)  BMI 25.81 kg/m2 Constitutional: He is oriented to person, place, and time. He appears well-developed and well-nourished. No distress.  HENT: No nasal discharge.  Head: Normocephalic and atraumatic.  Eyes: Pupils are equal and round. Right eye exhibits no discharge. Left eye exhibits no discharge.  Neck: Normal range of motion. Neck supple. No JVD present. No thyromegaly present.  Cardiovascular: Normal rate,  regular rhythm, normal heart sounds absent distal pulses. Exam reveals no gallop and no friction rub. No murmur heard.  A loud bruit is here  in the left upper chest area and left carotid artery area which seems to be due to his left arm fistula. Pulmonary/Chest: Effort normal and breath sounds normal. No stridor. No respiratory distress. He has no wheezes. He has no rales. He exhibits no tenderness.  Abdominal: Soft. Bowel sounds are normal. He exhibits no distension. There is no tenderness. There is no rebound and no guarding.  Musculoskeletal: Normal range of motion. He exhibits trace edema and no tenderness.  Neurological: He is alert and oriented to person, place, and time. Coordination normal.  Skin: Skin is warm and dry. No rash noted. He is not diaphoretic. No erythema. No pallor.  Psychiatric: He has a normal mood and affect. His behavior is normal. Judgment and thought content normal.       EKG: Sinus  Rhythm  -RSR(V1) -nondiagnostic.   -Left atrial enlargement.   -  Nonspecific T-abnormality.   ABNORMAL    ASSESSMENT AND PLAN

## 2012-08-08 NOTE — Patient Instructions (Addendum)
Continue same medications.  Follow up in 6 months.  

## 2012-08-08 NOTE — Assessment & Plan Note (Signed)
Likely from left arm fistula. Normal carotid duplex us.    

## 2012-08-08 NOTE — Assessment & Plan Note (Signed)
BP is high but runs low on days of dialysis. No changes.

## 2012-08-08 NOTE — Assessment & Plan Note (Signed)
The patient seems to be stable from a cardiac standpoint. He has no symptoms suggestive of angina. He had a cardiac catheterization done last year which showed patent grafts. Continue medical therapy.

## 2012-08-08 NOTE — Assessment & Plan Note (Signed)
Lab Results  Component Value Date   CHOL 134 01/26/2011   HDL 53.30 01/26/2011   LDLCALC 64 01/26/2011   TRIG 84.0 01/26/2011   CHOLHDL 3 01/26/2011   Continue Crestor.

## 2012-09-08 ENCOUNTER — Ambulatory Visit (INDEPENDENT_AMBULATORY_CARE_PROVIDER_SITE_OTHER): Payer: Medicare Other | Admitting: Family Medicine

## 2012-09-08 ENCOUNTER — Encounter: Payer: Self-pay | Admitting: Family Medicine

## 2012-09-08 VITALS — BP 162/88 | HR 86 | Temp 98.1°F | Wt 198.5 lb

## 2012-09-08 DIAGNOSIS — E109 Type 1 diabetes mellitus without complications: Secondary | ICD-10-CM

## 2012-09-08 DIAGNOSIS — J159 Unspecified bacterial pneumonia: Secondary | ICD-10-CM

## 2012-09-08 DIAGNOSIS — N186 End stage renal disease: Secondary | ICD-10-CM

## 2012-09-08 DIAGNOSIS — Z951 Presence of aortocoronary bypass graft: Secondary | ICD-10-CM

## 2012-09-08 DIAGNOSIS — I252 Old myocardial infarction: Secondary | ICD-10-CM

## 2012-09-08 MED ORDER — LEVOFLOXACIN 250 MG PO TABS: ORAL_TABLET | ORAL | Status: DC

## 2012-09-08 NOTE — Patient Instructions (Signed)
Levaquin 250 mg:  2 tablets on Thursday 1 tablet on Saturday 1 tablet on Monday 1 tablet on Wednesday

## 2012-09-08 NOTE — Progress Notes (Signed)
Nature conservation officer at [facility] [address] [place] [address] Phone: [phone] Fax: [phone]  Date:  09/08/2012   Name:  Taylor [name]   DOB:  07-16-1971   MRN:  [id number] Gender: male Age: 41 y.o.  PCP:  Taylor Mannan, MD  Evaluating MD: Taylor Beat, MD   Chief Complaint: Nasal Congestion and Blood Sugar Problem   History of Present Illness:  Taylor [name] is a 41 y.o. pleasant patient who presents with the following:  Unhealthy gentleman with ESRD, DM type 1, poorly controlled, CAD s/p CABG and h/o MI with acute work-in:  Hurts significantly in his chest and raspy when coughing, drained does not feel well at all with BS 400. Cough is productive. Afebrile. Mild nasal congestion with no significant sore throat or ear ache. Eating and drinking OK. No diarrhea.  Sees Dr. Leslie [name], uses. 4 times a day. BS 400 this AM, which is not unusual with this patient whose last a1c was 13. He does have a novolog SSI that he uses per his report to me.  Patient Active Problem List  Diagnosis  . HYPOTHYROIDISM  . DIABETES MELLITUS, TYPE I  . HYPERLIPIDEMIA  . MYOCARDIAL INFARCTION, HX OF  . End stage renal disease  . S/P CABG (coronary artery bypass graft)  . HTN (hypertension)  . Scrotal abscess  . DKA (diabetic ketoacidoses)  . Panic attacks  . Abscess and cellulitis  . Diabetic hyperosmolar non-ketotic state  . Type 1 diabetes mellitus with diabetic foot ulcer  . Diabetic peripheral neuropathy  . Hyponatremia  . Sebaceous cyst, neck  . Left carotid bruit  . PNA (pneumonia)    Past Medical History  Diagnosis Date  . End stage renal disease on dialysis     LUE fistula  . Diabetes mellitus     TYPE I  . Diabetic neuropathy     sever, s/p multiple toe amputation  . Thyroid disease     hypothyroidism  . Myocardial infarction   . Hypertension   . Hyperlipidemia   . MI (myocardial infarction)   . ESRD on hemodialysis   . Heart  murmur   . Hypothyroidism   . H/O hiatal hernia   . Neuromuscular disorder     Diabetic neuropathy  . GERD (gastroesophageal reflux disease)   . Anxiety   . Sebaceous cyst     side of neck  . Shortness of breath     WITH EXERTION  OR FLUID RETENTION  . Pneumonia     2010  . Anemia   . Blood transfusion     2011  . PAD (peripheral artery disease)     pt states he is scheduled for balloon angioplasty of 2 blockages in his rt leg on Wed 02/1012 at  [facility].  . Coronary artery disease 10/2009    CABG; Hx. of MI. Cardiac cath 11/2010: severe 3 vessel CAD, patent LIMA to LAD, SVG to OM1, SVG to LPDA and SVG to RCA.  PT'S CARDIOLOGIST IS DR. Kirke Corin WITH [facility] CARE IN [place]  . Cataract     right    Past Surgical History  Procedure Date  . Dialysis fistula creation     left upper arm fistula  . Amputation     TOES ON BOTH FEET  . Abscess drainage Behind right ear/occipital scalp  . Skin graft   . Eye surgery 1999  . Cardiac catheterization   . Coronary artery bypass graft 10/2009  [facility] (Dr. Katrinka Blazing)  . Foot amputation     History  Substance Use Topics  . Smoking status: Never Smoker   . Smokeless tobacco: Former Neurosurgeon  . Alcohol Use: 0.0 oz/week     occasional beer    Family History  Problem Relation Age of Onset  . Heart disease Other   . Hypertension Other   . Hypertension Mother   . Heart disease Mother   . Diabetes Mother   . Birth defects Paternal Uncle     unaware  . Birth defects Paternal Grandmother     breast    Allergies  Allergen Reactions  . Amoxicillin-Pot Clavulanate Nausea And Vomiting  . Hydromorphone Hcl     "body started down"  . Rifampin Nausea And Vomiting    Medication list has been reviewed and updated.  Outpatient Prescriptions Prior to Visit  Medication Sig Dispense Refill  . albuterol (PROVENTIL HFA;VENTOLIN HFA) 108 (90 BASE) MCG/ACT inhaler Inhale 2 puffs into the lungs every 6 (six) hours as  needed for wheezing.  1 Inhaler  0  . aspirin 325 MG tablet Take 325 mg by mouth daily with breakfast.       . carvedilol (COREG) 25 MG tablet Take 1 tablet (25 mg total) by mouth 2 (two) times daily with a meal.  180 tablet  3  . cinacalcet (SENSIPAR) 60 MG tablet Take 60 mg by mouth daily at 12 noon. With biggest meal      . furosemide (LASIX) 80 MG tablet Take 1 tablet (80 mg total) by mouth 2 (two) times daily.  60 tablet  6  . insulin aspart (NOVOLOG) 100 UNIT/ML injection Inject 0-10 Units into the skin 3 (three) times daily with meals. Before each meal 3 times a day, 140-199 - 2 units, 200-250 - 4 units, 250-299 - 6 units, 300-349 - 8 units,  350 or above 10 units. Take along with premeal Novolog.      . insulin aspart (NOVOLOG) 100 UNIT/ML injection Inject 7 Units into the skin 3 (three) times daily before meals. Plus the sliding scale      . insulin glargine (LANTUS) 100 UNIT/ML injection Inject 7 Units into the skin 2 (two) times daily.      . isosorbide mononitrate (IMDUR) 30 MG 24 hr tablet Take 1 tablet (30 mg total) by mouth 2 (two) times daily.  60 tablet  6  . levothyroxine (SYNTHROID, LEVOTHROID) 75 MCG tablet Take 75 mcg by mouth daily before breakfast.      . LYRICA 150 MG capsule TAKE 1 CAPSULE BY MOUTH TWICE DAILY  180 each  0  . Omega-3 Fatty Acids (FISH OIL) 1000 MG CAPS Take 1,000 mg by mouth daily.       [name] omeprazole (PRILOSEC) 40 MG capsule TAKE ONE CAPSULE BY MOUTH EVERY DAY  30 capsule  5  . rosuvastatin (CRESTOR) 20 MG tablet Take 20 mg by mouth at bedtime.      . chlorpheniramine-HYDROcodone (TUSSIONEX PENNKINETIC ER) 10-8 MG/5ML LQCR Take 5 mLs by mouth at bedtime as needed.  140 mL  0    Review of Systems:  ROS: GEN: Acute illness details above GI: Tolerating PO intake GU: maintaining adequate hydration and urination Pulm: as above Interactive and getting along well at home.  Otherwise, ROS is as per the HPI.   Physical Examination: Filed Vitals:    09/08/12 0944  BP: 162/88  Pulse: 86  Temp: 98.1 F (36.7 C)  TempSrc: Oral  Weight:  198 lb 8 oz (90.039 kg)  SpO2: 98%    There is no height on file to calculate BMI. Ideal Body Weight:     GEN: A and O x 3. WDWN. NAD.    ENT: Nose clear, ext NML.  No LAD.  No JVD.  TM's clear. Oropharynx clear.  PULM: Normal WOB, no distress. No crackles, wheezes. Rare scattered rhonchi CV: RRR, no M/G/R, No rubs, No JVD.   EXT: warm and well-perfused, No c/c/e. PSYCH: Pleasant and conversant.   Assessment and Plan:  1. Pneumonia, bacterial   2. End stage renal disease   3. DIABETES MELLITUS, TYPE I   4. MYOCARDIAL INFARCTION, HX OF   5. S/P CABG (coronary artery bypass graft)     Multiple comorbidities, 02 sat 98%. LVQ, dosing based on CrCL 10-19  Orders Today:  No orders of the defined types were placed in this encounter.    Updated Medication List: (Includes new medications, updates to list, dose adjustments) Meds ordered this encounter  Medications  . levofloxacin (LEVAQUIN) 250 MG tablet    Sig: 2 tabs po x 1st day, then 1 tab po every 48 hours    Dispense:  5 tablet    Refill:  0    Medications Discontinued: Medications Discontinued During This Encounter  Medication Reason  . chlorpheniramine-HYDROcodone (TUSSIONEX PENNKINETIC ER) 10-8 MG/5ML [facility] Patient has not taken in last 30 days     Taylor Beat, MD

## 2012-09-20 ENCOUNTER — Ambulatory Visit (INDEPENDENT_AMBULATORY_CARE_PROVIDER_SITE_OTHER): Payer: Medicare Other | Admitting: Family Medicine

## 2012-09-20 VITALS — BP 150/90 | HR 78 | Temp 97.4°F | Wt 197.0 lb

## 2012-09-20 DIAGNOSIS — J189 Pneumonia, unspecified organism: Secondary | ICD-10-CM

## 2012-09-20 MED ORDER — LEVOFLOXACIN 250 MG PO TABS: ORAL_TABLET | ORAL | Status: DC

## 2012-09-20 NOTE — Progress Notes (Signed)
Patient ID: Taylor [name], male    DOB: 02-Oct-1971, 41 y.o.   MRN: [id number]  HPI Comments: Mr. Taylor [name] is a pleasant Gentleman with history of coronary artery disease, bypass surgery in December 2010 at [facility], poorly controlled diabetes, hypertension, end-stage renal disease on dialysis on Tuesday, Thursday and Saturday here for follow up PNA.  Saw Dr. Patsy Lager on 10/31- note reviewed. Felt he had PNA and treated with 5 day course of Levaquin (alternated days due to HD).  Started to feel better, now cough more productive. Afebrile.  Patient Active Problem List  Diagnosis  . HYPOTHYROIDISM  . DIABETES MELLITUS, TYPE I  . HYPERLIPIDEMIA  . MYOCARDIAL INFARCTION, HX OF  . End stage renal disease  . S/P CABG (coronary artery bypass graft)  . HTN (hypertension)  . Scrotal abscess  . DKA (diabetic ketoacidoses)  . Panic attacks  . Abscess and cellulitis  . Diabetic hyperosmolar non-ketotic state  . Type 1 diabetes mellitus with diabetic foot ulcer  . Diabetic peripheral neuropathy  . Hyponatremia  . Sebaceous cyst, neck  . Left carotid bruit  . PNA (pneumonia)   Past Medical History  Diagnosis Date  . End stage renal disease on dialysis     LUE fistula  . Diabetes mellitus     TYPE I  . Diabetic neuropathy     sever, s/p multiple toe amputation  . Thyroid disease     hypothyroidism  . Myocardial infarction   . Hypertension   . Hyperlipidemia   . MI (myocardial infarction)   . ESRD on hemodialysis   . Heart murmur   . Hypothyroidism   . H/O hiatal hernia   . Neuromuscular disorder     Diabetic neuropathy  . GERD (gastroesophageal reflux disease)   . Anxiety   . Sebaceous cyst     side of neck  . Shortness of breath     WITH EXERTION  OR FLUID RETENTION  . Pneumonia     2010  . Anemia   . Blood transfusion     2011  . PAD (peripheral artery disease)     pt states he is scheduled for balloon angioplasty of 2 blockages in his rt leg on Wed 02/1012 at   [facility].  . Coronary artery disease 10/2009    CABG; Hx. of MI. Cardiac cath 11/2010: severe 3 vessel CAD, patent LIMA to LAD, SVG to OM1, SVG to LPDA and SVG to RCA.  PT'S CARDIOLOGIST IS DR. Kirke Corin WITH [facility] CARE IN [place]  . Cataract     right   Past Surgical History  Procedure Date  . Dialysis fistula creation     left upper arm fistula  . Amputation     TOES ON BOTH FEET  . Abscess drainage Behind right ear/occipital scalp  . Skin graft   . Eye surgery 1999  . Cardiac catheterization   . Coronary artery bypass graft 10/2009    [facility] (Dr. Katrinka Blazing)  . Foot amputation    History  Substance Use Topics  . Smoking status: Never Smoker   . Smokeless tobacco: Former Neurosurgeon  . Alcohol Use: 0.0 oz/week     Comment: occasional beer   Family History  Problem Relation Age of Onset  . Heart disease Other   . Hypertension Other   . Hypertension Mother   . Heart disease Mother   . Diabetes Mother   . Birth defects Paternal Uncle     unaware  .  Birth defects Paternal Grandmother     breast   Allergies  Allergen Reactions  . Amoxicillin-Pot Clavulanate Nausea And Vomiting  . Hydromorphone Hcl     "body started down"  . Rifampin Nausea And Vomiting   Current Outpatient Prescriptions on File Prior to Visit  Medication Sig Dispense Refill  . albuterol (PROVENTIL HFA;VENTOLIN HFA) 108 (90 BASE) MCG/ACT inhaler Inhale 2 puffs into the lungs every 6 (six) hours as needed for wheezing.  1 Inhaler  0  . aspirin 325 MG tablet Take 325 mg by mouth daily with breakfast.       . carvedilol (COREG) 25 MG tablet Take 1 tablet (25 mg total) by mouth 2 (two) times daily with a meal.  180 tablet  3  . cinacalcet (SENSIPAR) 60 MG tablet Take 60 mg by mouth daily at 12 noon. With biggest meal      . furosemide (LASIX) 80 MG tablet Take 1 tablet (80 mg total) by mouth 2 (two) times daily.  60 tablet  6  . insulin aspart (NOVOLOG) 100 UNIT/ML injection Inject 0-10  Units into the skin 3 (three) times daily with meals. Before each meal 3 times a day, 140-199 - 2 units, 200-250 - 4 units, 250-299 - 6 units, 300-349 - 8 units,  350 or above 10 units. Take along with premeal Novolog.      . insulin aspart (NOVOLOG) 100 UNIT/ML injection Inject 7 Units into the skin 3 (three) times daily before meals. Plus the sliding scale      . insulin glargine (LANTUS) 100 UNIT/ML injection Inject 7 Units into the skin 2 (two) times daily.      . isosorbide mononitrate (IMDUR) 30 MG 24 hr tablet Take 1 tablet (30 mg total) by mouth 2 (two) times daily.  60 tablet  6  . levofloxacin (LEVAQUIN) 250 MG tablet 2 tabs po x 1st day, then 1 tab po every 48 hours  5 tablet  0  . levothyroxine (SYNTHROID, LEVOTHROID) 75 MCG tablet Take 75 mcg by mouth daily before breakfast.      . LYRICA 150 MG capsule TAKE 1 CAPSULE BY MOUTH TWICE DAILY  180 each  0  . Omega-3 Fatty Acids (FISH OIL) 1000 MG CAPS Take 1,000 mg by mouth daily.       [name] omeprazole (PRILOSEC) 40 MG capsule TAKE ONE CAPSULE BY MOUTH EVERY DAY  30 capsule  5  . rosuvastatin (CRESTOR) 20 MG tablet Take 20 mg by mouth at bedtime.       The PMH, PSH, Social History, Family History, Medications, and allergies have been reviewed in [facility], and have been updated if relevant.   ROS: See HPI  Patient Active Problem List  Diagnosis  . HYPOTHYROIDISM  . DIABETES MELLITUS, TYPE I  . HYPERLIPIDEMIA  . MYOCARDIAL INFARCTION, HX OF  . End stage renal disease  . S/P CABG (coronary artery bypass graft)  . HTN (hypertension)  . Scrotal abscess  . DKA (diabetic ketoacidoses)  . Panic attacks  . Abscess and cellulitis  . Diabetic hyperosmolar non-ketotic state  . Type 1 diabetes mellitus with diabetic foot ulcer  . Diabetic peripheral neuropathy  . Hyponatremia  . Sebaceous cyst, neck  . Left carotid bruit  . PNA (pneumonia)   Past Medical History  Diagnosis Date  . End stage renal disease on dialysis     LUE fistula  .  Diabetes mellitus     TYPE I  . Diabetic neuropathy  sever, s/p multiple toe amputation  . Thyroid disease     hypothyroidism  . Myocardial infarction   . Hypertension   . Hyperlipidemia   . MI (myocardial infarction)   . ESRD on hemodialysis   . Heart murmur   . Hypothyroidism   . H/O hiatal hernia   . Neuromuscular disorder     Diabetic neuropathy  . GERD (gastroesophageal reflux disease)   . Anxiety   . Sebaceous cyst     side of neck  . Shortness of breath     WITH EXERTION  OR FLUID RETENTION  . Pneumonia     2010  . Anemia   . Blood transfusion     2011  . PAD (peripheral artery disease)     pt states he is scheduled for balloon angioplasty of 2 blockages in his rt leg on Wed 02/1012 at  [facility].  . Coronary artery disease 10/2009    CABG; Hx. of MI. Cardiac cath 11/2010: severe 3 vessel CAD, patent LIMA to LAD, SVG to OM1, SVG to LPDA and SVG to RCA.  PT'S CARDIOLOGIST IS DR. Kirke Corin WITH [facility] C[facility] IN [place]  . Cataract     right   Past Surgical History  Procedure Date  . Dialysis fistula creation     left upper arm fistula  . Amputation     TOES ON BOTH FEET  . Abscess drainage Behind right ear/occipital scalp  . Skin graft   . Eye surgery 1999  . Cardiac catheterization   . Coronary artery bypass graft 10/2009    [facility] (Dr. Katrinka Blazing)  . Foot amputation    History  Substance Use Topics  . Smoking status: Never Smoker   . Smokeless tobacco: Former Neurosurgeon  . Alcohol Use: 0.0 oz/week     Comment: occasional beer   Family History  Problem Relation Age of Onset  . Heart disease Other   . Hypertension Other   . Hypertension Mother   . Heart disease Mother   . Diabetes Mother   . Birth defects Paternal Uncle     unaware  . Birth defects Paternal Grandmother     breast   Allergies  Allergen Reactions  . Amoxicillin-Pot Clavulanate Nausea And Vomiting  . Hydromorphone Hcl     "body started down"  . Rifampin Nausea  And Vomiting   Current Outpatient Prescriptions on File Prior to Visit  Medication Sig Dispense Refill  . albuterol (PROVENTIL HFA;VENTOLIN HFA) 108 (90 BASE) MCG/ACT inhaler Inhale 2 puffs into the lungs every 6 (six) hours as needed for wheezing.  1 Inhaler  0  . aspirin 325 MG tablet Take 325 mg by mouth daily with breakfast.       . carvedilol (COREG) 25 MG tablet Take 1 tablet (25 mg total) by mouth 2 (two) times daily with a meal.  180 tablet  3  . cinacalcet (SENSIPAR) 60 MG tablet Take 60 mg by mouth daily at 12 noon. With biggest meal      . furosemide (LASIX) 80 MG tablet Take 1 tablet (80 mg total) by mouth 2 (two) times daily.  60 tablet  6  . insulin aspart (NOVOLOG) 100 UNIT/ML injection Inject 0-10 Units into the skin 3 (three) times daily with meals. Before each meal 3 times a day, 140-199 - 2 units, 200-250 - 4 units, 250-299 - 6 units, 300-349 - 8 units,  350 or above 10 units. Take along with premeal Novolog.      [name]  insulin aspart (NOVOLOG) 100 UNIT/ML injection Inject 7 Units into the skin 3 (three) times daily before meals. Plus the sliding scale      . insulin glargine (LANTUS) 100 UNIT/ML injection Inject 7 Units into the skin 2 (two) times daily.      . isosorbide mononitrate (IMDUR) 30 MG 24 hr tablet Take 1 tablet (30 mg total) by mouth 2 (two) times daily.  60 tablet  6  . levofloxacin (LEVAQUIN) 250 MG tablet 2 tabs po x 1st day, then 1 tab po every 48 hours  5 tablet  0  . levothyroxine (SYNTHROID, LEVOTHROID) 75 MCG tablet Take 75 mcg by mouth daily before breakfast.      . LYRICA 150 MG capsule TAKE 1 CAPSULE BY MOUTH TWICE DAILY  180 each  0  . Omega-3 Fatty Acids (FISH OIL) 1000 MG CAPS Take 1,000 mg by mouth daily.       [name] omeprazole (PRILOSEC) 40 MG capsule TAKE ONE CAPSULE BY MOUTH EVERY DAY  30 capsule  5  . rosuvastatin (CRESTOR) 20 MG tablet Take 20 mg by mouth at bedtime.         The PMH, PSH, Social History, Family History, Medications, and allergies  have been reviewed in [facility], and have been updated if relevant.   Physical Exam  There were no vitals taken for this visit.  Constitutional: He is oriented to person, place, and time. He appears well-developed, appears weaker than usual, pale and HYPERTENSIVE. HENT:  Head: Normocephalic.  Nose: boggy turbinates, sinuses NTTP  Cardiovascular: Normal rate, regular rhythm, S1 normal, S2 normal and intact distal pulses.  Exam reveals no gallop and no friction rub.   Murmur heard.  Systolic murmur is present with a grade of 2/6  Pulmonary/Chest: No respiratory distress. He has no wheezes. No rhonchi Neurological: He is alert and oriented to person, place, and time. Coordination normal.     Psychiatric: He has a normal mood and affect. His behavior is normal. Judgment and thought content normal.         Assessment and Plan   1. PNA (pneumonia)    Improved but persistent.  Given that he is chronically ill with multiple comorbidities, will extend course of Levaquin. If no improvement or worsening symptoms, consider imaging. The patient indicates understanding of these issues and agrees with the plan.

## 2012-09-20 NOTE — Patient Instructions (Addendum)
Good to see you. Please take levaquin as directed. Please call me later this week with an update.

## 2012-09-26 ENCOUNTER — Telehealth: Payer: Self-pay

## 2012-09-26 MED ORDER — PREGABALIN 150 MG PO CAPS: ORAL_CAPSULE | ORAL | Status: DC

## 2012-09-26 NOTE — Telephone Encounter (Signed)
Glad he is feeling better- cough may linger for weeks.  I will increase dose of Lyrica.

## 2012-09-26 NOTE — Telephone Encounter (Signed)
Advised patient, medicine called to [facility].

## 2012-09-26 NOTE — Telephone Encounter (Signed)
Pt called update; feeling a lot better but still hoarse with productive cough with clear phlegm. Pt also request change in instructions or higher dose of Lyrica; pt presently taking Lyrica 150 mg one bid; occasionally takes 2 at bedtime due to hands and feet hurting badly; helps pt rest. [facility].Please advise.

## 2012-10-19 ENCOUNTER — Ambulatory Visit (INDEPENDENT_AMBULATORY_CARE_PROVIDER_SITE_OTHER): Payer: Medicare Other | Admitting: Family Medicine

## 2012-10-19 ENCOUNTER — Encounter: Payer: Self-pay | Admitting: Family Medicine

## 2012-10-19 VITALS — BP 164/88 | HR 84 | Temp 98.0°F | Wt 205.0 lb

## 2012-10-19 DIAGNOSIS — S81801A Unspecified open wound, right lower leg, initial encounter: Secondary | ICD-10-CM

## 2012-10-19 DIAGNOSIS — S81802A Unspecified open wound, left lower leg, initial encounter: Secondary | ICD-10-CM

## 2012-10-19 DIAGNOSIS — S81009A Unspecified open wound, unspecified knee, initial encounter: Secondary | ICD-10-CM

## 2012-10-19 MED ORDER — DOXYCYCLINE HYCLATE 100 MG PO TABS: 100.0000 mg | ORAL_TABLET | Freq: Two times a day (BID) | ORAL | Status: DC

## 2012-10-19 NOTE — Patient Instructions (Addendum)
Please take doxycyline as directed and keep area clean and dry. If it does start to drain or redness spreads, please come back to see me immediately.  I hope you and Taylor [name] have a wonderful Christmas.

## 2012-10-19 NOTE — Progress Notes (Signed)
Patient ID: Taylor [name], male    DOB: 12-27-1970, 42 y.o.   MRN: [id number]  HPI Comments: Taylor [name] is a pleasant Gentleman with history of coronary artery disease, bypass surgery in December 2010 at [facility], poorly controlled diabetes with vascular/renal complications, hypertension, end-stage renal disease on dialysis on Tuesday, Thursday and Saturday here for ? infected wounds.  Has significant neuropathy so does not remember hitting his leg but thinks he hit his leg on TV stand.  Initially, three abrasions, now with surrounding erythema.  No fevers or chills. No n/v/d.   Patient Active Problem List  Diagnosis  . HYPOTHYROIDISM  . DIABETES MELLITUS, TYPE I  . HYPERLIPIDEMIA  . MYOCARDIAL INFARCTION, HX OF  . End stage renal disease  . S/P CABG (coronary artery bypass graft)  . HTN (hypertension)  . Scrotal abscess  . DKA (diabetic ketoacidoses)  . Panic attacks  . Abscess and cellulitis  . Diabetic hyperosmolar non-ketotic state  . Type 1 diabetes mellitus with diabetic foot ulcer  . Diabetic peripheral neuropathy  . Hyponatremia  . Sebaceous cyst, neck  . Left carotid bruit  . PNA (pneumonia)   Past Medical History  Diagnosis Date  . End stage renal disease on dialysis     LUE fistula  . Diabetes mellitus     TYPE I  . Diabetic neuropathy     sever, s/p multiple toe amputation  . Thyroid disease     hypothyroidism  . Myocardial infarction   . Hypertension   . Hyperlipidemia   . MI (myocardial infarction)   . ESRD on hemodialysis   . Heart murmur   . Hypothyroidism   . H/O hiatal hernia   . Neuromuscular disorder     Diabetic neuropathy  . GERD (gastroesophageal reflux disease)   . Anxiety   . Sebaceous cyst     side of neck  . Shortness of breath     WITH EXERTION  OR FLUID RETENTION  . Pneumonia     2010  . Anemia   . Blood transfusion     2011  . PAD (peripheral artery disease)     pt states he is scheduled for balloon angioplasty of  2 blockages in his rt leg on Wed 02/1012 at  [facility].  . Coronary artery disease 10/2009    CABG; Hx. of MI. Cardiac cath 11/2010: severe 3 vessel CAD, patent LIMA to LAD, SVG to OM1, SVG to LPDA and SVG to RCA.  PT'S CARDIOLOGIST IS DR. Kirke Corin WITH [facility] CARE IN [place]  . Cataract     right   Past Surgical History  Procedure Date  . Dialysis fistula creation     left upper arm fistula  . Amputation     TOES ON BOTH FEET  . Abscess drainage Behind right ear/occipital scalp  . Skin graft   . Eye surgery 1999  . Cardiac catheterization   . Coronary artery bypass graft 10/2009    [facility] (Dr. Katrinka Blazing)  . Foot amputation    History  Substance Use Topics  . Smoking status: Never Smoker   . Smokeless tobacco: Former Neurosurgeon  . Alcohol Use: 0.0 oz/week     Comment: occasional beer   Family History  Problem Relation Age of Onset  . Heart disease Other   . Hypertension Other   . Hypertension Mother   . Heart disease Mother   . Diabetes Mother   . Birth defects Paternal Uncle  unaware  . Birth defects Paternal Grandmother     breast   Allergies  Allergen Reactions  . Amoxicillin-Pot Clavulanate Nausea And Vomiting  . Hydromorphone Hcl     "body started down"  . Rifampin Nausea And Vomiting   Current Outpatient Prescriptions on File Prior to Visit  Medication Sig Dispense Refill  . albuterol (PROVENTIL HFA;VENTOLIN HFA) 108 (90 BASE) MCG/ACT inhaler Inhale 2 puffs into the lungs every 6 (six) hours as needed for wheezing.  1 Inhaler  0  . aspirin 325 MG tablet Take 325 mg by mouth daily with breakfast.       . carvedilol (COREG) 25 MG tablet Take 1 tablet (25 mg total) by mouth 2 (two) times daily with a meal.  180 tablet  3  . cinacalcet (SENSIPAR) 60 MG tablet Take 60 mg by mouth daily at 12 noon. With biggest meal      . furosemide (LASIX) 80 MG tablet Take 1 tablet (80 mg total) by mouth 2 (two) times daily.  60 tablet  6  . insulin aspart  (NOVOLOG) 100 UNIT/ML injection Inject 0-10 Units into the skin 3 (three) times daily with meals. Before each meal 3 times a day, 140-199 - 2 units, 200-250 - 4 units, 250-299 - 6 units, 300-349 - 8 units,  350 or above 10 units. Take along with premeal Novolog.      . insulin aspart (NOVOLOG) 100 UNIT/ML injection Inject 7 Units into the skin 3 (three) times daily before meals. Plus the sliding scale      . insulin glargine (LANTUS) 100 UNIT/ML injection Inject 7 Units into the skin 2 (two) times daily.      . isosorbide mononitrate (IMDUR) 30 MG 24 hr tablet Take 1 tablet (30 mg total) by mouth 2 (two) times daily.  60 tablet  6  . levofloxacin (LEVAQUIN) 250 MG tablet 2 tabs po x 1st day, then 1 tab po every 48 hours  5 tablet  0  . levothyroxine (SYNTHROID, LEVOTHROID) 75 MCG tablet Take 75 mcg by mouth daily before breakfast.      . Omega-3 Fatty Acids (FISH OIL) 1000 MG CAPS Take 1,000 mg by mouth daily.       [name] omeprazole (PRILOSEC) 40 MG capsule TAKE ONE CAPSULE BY MOUTH EVERY DAY  30 capsule  5  . pregabalin (LYRICA) 150 MG capsule 1 tab every morning, two tabs by mouth every evening.  90 capsule  0  . rosuvastatin (CRESTOR) 20 MG tablet Take 20 mg by mouth at bedtime.       The PMH, PSH, Social History, Family History, Medications, and allergies have been reviewed in [facility], and have been updated if relevant.   ROS: See HPI  Patient Active Problem List  Diagnosis  . HYPOTHYROIDISM  . DIABETES MELLITUS, TYPE I  . HYPERLIPIDEMIA  . MYOCARDIAL INFARCTION, HX OF  . End stage renal disease  . S/P CABG (coronary artery bypass graft)  . HTN (hypertension)  . Scrotal abscess  . DKA (diabetic ketoacidoses)  . Panic attacks  . Abscess and cellulitis  . Diabetic hyperosmolar non-ketotic state  . Type 1 diabetes mellitus with diabetic foot ulcer  . Diabetic peripheral neuropathy  . Hyponatremia  . Sebaceous cyst, neck  . Left carotid bruit  . PNA (pneumonia)   Past Medical History   Diagnosis Date  . End stage renal disease on dialysis     LUE fistula  . Diabetes mellitus     TYPE  I  . Diabetic neuropathy     sever, s/p multiple toe amputation  . Thyroid disease     hypothyroidism  . Myocardial infarction   . Hypertension   . Hyperlipidemia   . MI (myocardial infarction)   . ESRD on hemodialysis   . Heart murmur   . Hypothyroidism   . H/O hiatal hernia   . Neuromuscular disorder     Diabetic neuropathy  . GERD (gastroesophageal reflux disease)   . Anxiety   . Sebaceous cyst     side of neck  . Shortness of breath     WITH EXERTION  OR FLUID RETENTION  . Pneumonia     2010  . Anemia   . Blood transfusion     2011  . PAD (peripheral artery disease)     pt states he is scheduled for balloon angioplasty of 2 blockages in his rt leg on Wed 02/1012 at  [facility].  . Coronary artery disease 10/2009    CABG; Hx. of MI. Cardiac cath 11/2010: severe 3 vessel CAD, patent LIMA to LAD, SVG to OM1, SVG to LPDA and SVG to RCA.  PT'S CARDIOLOGIST IS DR. Kirke Corin WITH [facility] CARE IN [place]  . Cataract     right   Past Surgical History  Procedure Date  . Dialysis fistula creation     left upper arm fistula  . Amputation     TOES ON BOTH FEET  . Abscess drainage Behind right ear/occipital scalp  . Skin graft   . Eye surgery 1999  . Cardiac catheterization   . Coronary artery bypass graft 10/2009    [facility] (Dr. Katrinka Blazing)  . Foot amputation    History  Substance Use Topics  . Smoking status: Never Smoker   . Smokeless tobacco: Former Neurosurgeon  . Alcohol Use: 0.0 oz/week     Comment: occasional beer   Family History  Problem Relation Age of Onset  . Heart disease Other   . Hypertension Other   . Hypertension Mother   . Heart disease Mother   . Diabetes Mother   . Birth defects Paternal Uncle     unaware  . Birth defects Paternal Grandmother     breast   Allergies  Allergen Reactions  . Amoxicillin-Pot Clavulanate Nausea And  Vomiting  . Hydromorphone Hcl     "body started down"  . Rifampin Nausea And Vomiting   Current Outpatient Prescriptions on File Prior to Visit  Medication Sig Dispense Refill  . albuterol (PROVENTIL HFA;VENTOLIN HFA) 108 (90 BASE) MCG/ACT inhaler Inhale 2 puffs into the lungs every 6 (six) hours as needed for wheezing.  1 Inhaler  0  . aspirin 325 MG tablet Take 325 mg by mouth daily with breakfast.       . carvedilol (COREG) 25 MG tablet Take 1 tablet (25 mg total) by mouth 2 (two) times daily with a meal.  180 tablet  3  . cinacalcet (SENSIPAR) 60 MG tablet Take 60 mg by mouth daily at 12 noon. With biggest meal      . furosemide (LASIX) 80 MG tablet Take 1 tablet (80 mg total) by mouth 2 (two) times daily.  60 tablet  6  . insulin aspart (NOVOLOG) 100 UNIT/ML injection Inject 0-10 Units into the skin 3 (three) times daily with meals. Before each meal 3 times a day, 140-199 - 2 units, 200-250 - 4 units, 250-299 - 6 units, 300-349 - 8 units,  350 or above  10 units. Take along with premeal Novolog.      . insulin aspart (NOVOLOG) 100 UNIT/ML injection Inject 7 Units into the skin 3 (three) times daily before meals. Plus the sliding scale      . insulin glargine (LANTUS) 100 UNIT/ML injection Inject 7 Units into the skin 2 (two) times daily.      . isosorbide mononitrate (IMDUR) 30 MG 24 hr tablet Take 1 tablet (30 mg total) by mouth 2 (two) times daily.  60 tablet  6  . levofloxacin (LEVAQUIN) 250 MG tablet 2 tabs po x 1st day, then 1 tab po every 48 hours  5 tablet  0  . levothyroxine (SYNTHROID, LEVOTHROID) 75 MCG tablet Take 75 mcg by mouth daily before breakfast.      . Omega-3 Fatty Acids (FISH OIL) 1000 MG CAPS Take 1,000 mg by mouth daily.       [name] omeprazole (PRILOSEC) 40 MG capsule TAKE ONE CAPSULE BY MOUTH EVERY DAY  30 capsule  5  . pregabalin (LYRICA) 150 MG capsule 1 tab every morning, two tabs by mouth every evening.  90 capsule  0  . rosuvastatin (CRESTOR) 20 MG tablet Take 20 mg  by mouth at bedtime.         The PMH, PSH, Social History, Family History, Medications, and allergies have been reviewed in [facility], and have been updated if relevant.   Physical Exam  BP 164/88  Pulse 84  Temp 98 F (36.7 C)  Wt 205 lb (92.987 kg)  Constitutional: He is oriented to person, place, and time. He appears well-developed, appears weaker than usual, pale and HYPERTENSIVE. HENT:  Head: Normocephalic.  Pulmonary/Chest: No respiratory distress. He has no wheezes. No rhonchi Neurological: He is alert and oriented to person, place, and time. Coordination normal.     Psychiatric: He has a normal mood and affect. His behavior is normal. Judgment and thought content normal.   Skin: 3 scabbed lesions on right lower leg with surrounding erythema and warmth.  No drainage or foul odor.  Assessment and Plan  1.  Leg wound-  No evidence of ulceration, no drainage but there is surrounding erythema.  Given his poor wound healing, will treat with Doxycyline 100 mg twice daily x 10 days with close follow up. The patient indicates understanding of these issues and agrees with the plan.

## 2012-11-01 ENCOUNTER — Other Ambulatory Visit: Payer: Self-pay | Admitting: *Deleted

## 2012-11-01 MED ORDER — CARVEDILOL 25 MG PO TABS: 25.0000 mg | ORAL_TABLET | Freq: Two times a day (BID) | ORAL | Status: DC

## 2012-11-08 ENCOUNTER — Other Ambulatory Visit: Payer: Self-pay | Admitting: Family Medicine

## 2012-11-08 NOTE — Telephone Encounter (Signed)
[facility] called to [facility].

## 2012-11-14 ENCOUNTER — Ambulatory Visit (INDEPENDENT_AMBULATORY_CARE_PROVIDER_SITE_OTHER): Payer: Medicare Other | Admitting: Cardiovascular Disease

## 2012-11-14 ENCOUNTER — Encounter: Payer: Self-pay | Admitting: Cardiovascular Disease

## 2012-11-14 VITALS — BP 110/70 | HR 78 | Ht 75.0 in | Wt 209.2 lb

## 2012-11-14 DIAGNOSIS — I10 Essential (primary) hypertension: Secondary | ICD-10-CM

## 2012-11-14 DIAGNOSIS — Z951 Presence of aortocoronary bypass graft: Secondary | ICD-10-CM

## 2012-11-14 DIAGNOSIS — R0602 Shortness of breath: Secondary | ICD-10-CM

## 2012-11-14 DIAGNOSIS — E785 Hyperlipidemia, unspecified: Secondary | ICD-10-CM

## 2012-11-14 DIAGNOSIS — R0989 Other specified symptoms and signs involving the circulatory and respiratory systems: Secondary | ICD-10-CM

## 2012-11-14 DIAGNOSIS — R0789 Other chest pain: Secondary | ICD-10-CM

## 2012-11-14 NOTE — Progress Notes (Signed)
HPI  This is a 42 year old male who is here today for a followup visit. He has known history of three-vessel coronary artery disease diagnosed in 2010. He underwent coronary artery bypass graft surgery in December of 2010 at [facility]. He had cardiac catheterization done in January of 2012 by Dr.Khan which showed patent grafts. His ejection fraction is normal. He has multiple chronic conditions that include end-stage renal disease on hemodialysis followed by Dr. Thedore Mins, diabetes, hypertension and hyperlipidemia. He also has peripheral arterial disease managed by Dr. Gilda Crease. He is status post right toes amputation . He is here due to worsening dyspnea, chest tightness and orthopnea. He noticed this a few weeks ago and has been worsening. They're having to take more fluid off during dialysis. His blood pressure has been reasonably controlled. He has been taking his medications regularly.   Allergies  Allergen Reactions  . Amoxicillin-Pot Clavulanate Nausea And Vomiting  . Hydromorphone Hcl     "body started down"  . Rifampin Nausea And Vomiting     Current Outpatient Prescriptions on File Prior to Visit  Medication Sig Dispense Refill  . albuterol (PROVENTIL HFA;VENTOLIN HFA) 108 (90 BASE) MCG/ACT inhaler Inhale 2 puffs into the lungs every 6 (six) hours as needed for wheezing.  1 Inhaler  0  . aspirin 325 MG tablet Take 325 mg by mouth daily with breakfast.       . carvedilol (COREG) 25 MG tablet Take 1 tablet (25 mg total) by mouth 2 (two) times daily with a meal.  180 tablet  1  . cinacalcet (SENSIPAR) 60 MG tablet Take 60 mg by mouth daily at 12 noon. With biggest meal      . doxycycline (VIBRA-TABS) 100 MG tablet Take 1 tablet (100 mg total) by mouth 2 (two) times daily.  20 tablet  0  . furosemide (LASIX) 80 MG tablet Take 1 tablet (80 mg total) by mouth 2 (two) times daily.  60 tablet  6  . insulin aspart (NOVOLOG) 100 UNIT/ML injection Inject 0-10 Units into the skin 3 (three) times daily  with meals. Before each meal 3 times a day, 140-199 - 2 units, 200-250 - 4 units, 250-299 - 6 units, 300-349 - 8 units,  350 or above 10 units. Take along with premeal Novolog.      . insulin aspart (NOVOLOG) 100 UNIT/ML injection Inject 7 Units into the skin 3 (three) times daily before meals. Plus the sliding scale      . insulin glargine (LANTUS) 100 UNIT/ML injection Inject 7 Units into the skin 2 (two) times daily.      . isosorbide mononitrate (IMDUR) 30 MG 24 hr tablet Take 1 tablet (30 mg total) by mouth 2 (two) times daily.  60 tablet  6  . levothyroxine (SYNTHROID, LEVOTHROID) 75 MCG tablet Take 75 mcg by mouth daily before breakfast.      . LYRICA 150 MG capsule TAKE 1 CAPSULE BY MOUTH EVERY MORNING AND 2 CAPSULES EVERY EVENING  90 capsule  0  . Omega-3 Fatty Acids (FISH OIL) 1000 MG CAPS Take 1,000 mg by mouth daily.       [name] omeprazole (PRILOSEC) 40 MG capsule TAKE ONE CAPSULE BY MOUTH EVERY DAY  30 capsule  5  . rosuvastatin (CRESTOR) 20 MG tablet Take 20 mg by mouth at bedtime.         Past Medical History  Diagnosis Date  . End stage renal disease on dialysis     LUE fistula  .  Diabetes mellitus     TYPE I  . Diabetic neuropathy     sever, s/p multiple toe amputation  . Thyroid disease     hypothyroidism  . Myocardial infarction   . Hypertension   . Hyperlipidemia   . MI (myocardial infarction)   . ESRD on hemodialysis   . Heart murmur   . Hypothyroidism   . H/O hiatal hernia   . Neuromuscular disorder     Diabetic neuropathy  . GERD (gastroesophageal reflux disease)   . Anxiety   . Sebaceous cyst     side of neck  . Shortness of breath     WITH EXERTION  OR FLUID RETENTION  . Pneumonia     2010  . Anemia   . Blood transfusion     2011  . PAD (peripheral artery disease)     pt states he is scheduled for balloon angioplasty of 2 blockages in his rt leg on Wed 02/1012 at  [facility]  . Coronary artery disease 10/2009    CABG; Hx. of MI.  Cardiac cath 11/2010: severe 3 vessel CAD, patent LIMA to LAD, SVG to OM1, SVG to LPDA and SVG to RCA.  PT'S CARDIOLOGIST IS DR. Kirke Corin WITH [facility]ARE IN [place]  . Cataract     right     Past Surgical History  Procedure Date  . Dialysis fistula creation     left upper arm fistula  . Amputation     TOES ON BOTH FEET  . Abscess drainage Behind right ear/occipital scalp  . Skin graft   . Eye surgery 1999  . Cardiac catheterization   . Coronary artery bypass graft 10/2009    [facility] (Dr. Katrinka Blazing)  . Foot amputation      Family History  Problem Relation Age of Onset  . Heart disease Other   . Hypertension Other   . Hypertension Mother   . Heart disease Mother   . Diabetes Mother   . Birth defects Paternal Uncle     unaware  . Birth defects Paternal Grandmother     breast     History   Social History  . Marital Status: Single    Spouse Name: N/A    Number of Children: N/A  . Years of Education: N/A   Occupational History  . Not on file.   Social History Main Topics  . Smoking status: Never Smoker   . Smokeless tobacco: Former Neurosurgeon  . Alcohol Use: 0.0 oz/week     Comment: occasional beer  . Drug Use: No  . Sexually Active: Not Currently   Other Topics Concern  . Not on file   Social History Narrative  . No narrative on file     PHYSICAL EXAM   BP 110/70  Pulse 78  Ht 6\' 3"  (1.905 m)  Wt 209 lb 4 oz (94.915 kg)  BMI 26.15 kg/m2 Constitutional: He is oriented to person, place, and time. He appears well-developed and well-nourished. No distress.  HENT: No nasal discharge.  Head: Normocephalic and atraumatic.  Eyes: Pupils are equal and round. Right eye exhibits no discharge. Left eye exhibits no discharge.  Neck: Normal range of motion. Neck supple. No JVD present. No thyromegaly present.  Cardiovascular: Normal rate, regular rhythm, normal heart sounds absent distal pulses. Exam reveals no gallop and no friction rub. No murmur heard.  A loud  bruit is here in the left upper chest area and left carotid artery area which seems  to be due to his left arm fistula. Pulmonary/Chest: Effort normal and breath sounds normal. No stridor. No respiratory distress. He has no wheezes. He has no rales. He exhibits no tenderness.  Abdominal: Soft. Bowel sounds are normal. He exhibits no distension. There is no tenderness. There is no rebound and no guarding.  Musculoskeletal: Normal range of motion. He exhibits trace edema and no tenderness.  Neurological: He is alert and oriented to person, place, and time. Coordination normal.  Skin: Skin is warm and dry. No rash noted. He is not diaphoretic. No erythema. No pallor.  Psychiatric: He has a normal mood and affect. His behavior is normal. Judgment and thought content normal.     EKG: Sinus  Rhythm  -RSR(V1) -nondiagnostic.   -Left atrial enlargement.   -  Nonspecific T-abnormality.   ABNORMAL   ASSESSMENT AND PLAN

## 2012-11-14 NOTE — Assessment & Plan Note (Signed)
Likely from left arm fistula. Normal carotid duplex US.

## 2012-11-14 NOTE — Assessment & Plan Note (Signed)
His blood pressure is well-controlled 

## 2012-11-14 NOTE — Assessment & Plan Note (Signed)
Continue treatment with Crestor. 

## 2012-11-14 NOTE — Assessment & Plan Note (Signed)
The patient's recent symptoms of worsening dyspnea, orthopnea and chest tightness is highly concerning for possible underlying angina. I recommend further evaluation with a pharmacologic nuclear stress test and an echocardiogram. He is not able to exercise on a treadmill due to toe amputation, claudication and muscle atrophy. His blood pressure is well controlled. Continue current medications for now.

## 2012-11-14 NOTE — Patient Instructions (Addendum)
Your physician has requested that you have an echocardiogram. Echocardiography is a painless test that uses sound waves to create images of your heart. It provides your doctor with information about the size and shape of your heart and how well your heart's chambers and valves are working. This procedure takes approximately one hour. There are no restrictions for this procedure.  Your physician has requested that you have a lexiscan myoview. For further information please visit [email or web address] Please follow instruction sheet, as given.  Follow up after tests.   [facility] MYOVIEW  Your caregiver has ordered a Stress Test with nuclear imaging. The purpose of this test is to evaluate the blood supply to your heart muscle. This procedure is referred to as a "Non-Invasive Stress Test." This is because other than having an IV started in your vein, nothing is inserted or "invades" your body. Cardiac stress tests are done to find areas of poor blood flow to the heart by determining the extent of coronary artery disease (CAD). Some patients exercise on a treadmill, which naturally increases the blood flow to your heart, while others who are  unable to walk on a treadmill due to physical limitations have a pharmacologic/chemical stress agent called Lexiscan . This medicine will mimic walking on a treadmill by temporarily increasing your coronary blood flow.   Please note: these test may take anywhere between 2-4 hours to complete  PLEASE REPORT TO [facility] MEDICAL MALL ENTRANCE  THE VOLUNTEERS AT THE FIRST DESK WILL DIRECT YOU WHERE TO GO  Date of Procedure:___________1/10/14__________________________  Arrival Time for Procedure:_____10:15 am_________________________  Instructions regarding medication:   __x__ : Hold diabetes medication morning of procedure  __x_:  Hold betablocker(s) night before procedure and morning of procedure (Coreg/carvedilol)  ____:  Hold other medications as  follows:_________________________________________________________________________________________________________________________________________________________________________________________________________________________________________________________________________________________  PLEASE NOTIFY THE OFFICE AT LEAST 24 HOURS IN ADVANCE IF YOU ARE UNABLE TO KEEP YOUR APPOINTMENT.  [phone]  How to prepare for your Myoview test:  1. Do not eat or drink after midnight 2. No caffeine for 24 hours prior to test 3. Your medication may be taken with water.  If your doctor stopped a medication because of this test, do not take that medication. 4. Ladies, please do not wear dresses.  Skirts or pants are appropriate. Please wear a short sleeve shirt. 5. No perfume, cologne or lotion. 6. Wear comfortable walking shoes. No heels!

## 2012-11-15 ENCOUNTER — Other Ambulatory Visit: Payer: Self-pay | Admitting: *Deleted

## 2012-11-15 MED ORDER — INSULIN GLARGINE 100 UNIT/ML ~~LOC~~ SOLN: 7.0000 [IU] | Freq: Two times a day (BID) | SUBCUTANEOUS | Status: DC

## 2012-11-15 MED ORDER — OMEPRAZOLE 40 MG PO CPDR: DELAYED_RELEASE_CAPSULE | ORAL | Status: DC

## 2012-11-18 ENCOUNTER — Ambulatory Visit: Payer: Self-pay | Admitting: Cardiovascular Disease

## 2012-11-18 DIAGNOSIS — R079 Chest pain, unspecified: Secondary | ICD-10-CM

## 2012-11-21 ENCOUNTER — Ambulatory Visit (HOSPITAL_COMMUNITY): Payer: Medicare Other | Attending: Cardiovascular Disease | Admitting: Radiology

## 2012-11-21 DIAGNOSIS — E119 Type 2 diabetes mellitus without complications: Secondary | ICD-10-CM | POA: Insufficient documentation

## 2012-11-21 DIAGNOSIS — R0609 Other forms of dyspnea: Secondary | ICD-10-CM | POA: Insufficient documentation

## 2012-11-21 DIAGNOSIS — R0989 Other specified symptoms and signs involving the circulatory and respiratory systems: Secondary | ICD-10-CM | POA: Insufficient documentation

## 2012-11-21 DIAGNOSIS — I379 Nonrheumatic pulmonary valve disorder, unspecified: Secondary | ICD-10-CM | POA: Insufficient documentation

## 2012-11-21 DIAGNOSIS — N186 End stage renal disease: Secondary | ICD-10-CM | POA: Insufficient documentation

## 2012-11-21 DIAGNOSIS — R0602 Shortness of breath: Secondary | ICD-10-CM

## 2012-11-21 DIAGNOSIS — I369 Nonrheumatic tricuspid valve disorder, unspecified: Secondary | ICD-10-CM | POA: Insufficient documentation

## 2012-11-21 DIAGNOSIS — I059 Rheumatic mitral valve disease, unspecified: Secondary | ICD-10-CM | POA: Insufficient documentation

## 2012-11-21 DIAGNOSIS — R0789 Other chest pain: Secondary | ICD-10-CM

## 2012-11-21 DIAGNOSIS — I120 Hypertensive chronic kidney disease with stage 5 chronic kidney disease or end stage renal disease: Secondary | ICD-10-CM | POA: Insufficient documentation

## 2012-11-21 NOTE — Progress Notes (Signed)
Echocardiogram performed.  

## 2012-12-05 ENCOUNTER — Encounter: Payer: Self-pay | Admitting: Cardiovascular Disease

## 2012-12-05 ENCOUNTER — Ambulatory Visit (INDEPENDENT_AMBULATORY_CARE_PROVIDER_SITE_OTHER): Payer: Medicare Other | Admitting: Cardiovascular Disease

## 2012-12-05 VITALS — BP 170/98 | HR 91 | Ht 75.0 in | Wt 209.8 lb

## 2012-12-05 DIAGNOSIS — R079 Chest pain, unspecified: Secondary | ICD-10-CM

## 2012-12-05 DIAGNOSIS — E785 Hyperlipidemia, unspecified: Secondary | ICD-10-CM

## 2012-12-05 DIAGNOSIS — I10 Essential (primary) hypertension: Secondary | ICD-10-CM

## 2012-12-05 DIAGNOSIS — Z951 Presence of aortocoronary bypass graft: Secondary | ICD-10-CM

## 2012-12-05 NOTE — Progress Notes (Signed)
HPI  This is a 42 year old male who is here today for a followup visit. He has known history of three-vessel coronary artery disease diagnosed in 2010. He underwent coronary artery bypass graft surgery in December of 2010 at [facility]. He had cardiac catheterization done in January of 2012 by Dr.Khan which showed patent grafts. His ejection fraction was normal. He has multiple chronic conditions that include end-stage renal disease on hemodialysis followed by Dr. Thedore Mins, diabetes, hypertension and hyperlipidemia. He also has peripheral arterial disease managed by Dr. Gilda Crease. He is status post right toes amputation . He was seen recently for  worsening dyspnea, chest tightness and orthopnea. He has been compliant with all his medications. His blood pressure has been running high but it dropps during dialysis. He underwent a pharmacologic nuclear stress test which showed no clear evidence of ischemia. Echocardiogram showed normal LV systolic function with moderate pulmonary hypertension. Patient continues to feel the same. He is having dyspnea with minimal activities. Few nights ago he had a prolonged episode of chest tightness and was about to go to the emergency room. It lasted for more than an hour.  Allergies  Allergen Reactions  . Amoxicillin-Pot Clavulanate Nausea And Vomiting  . Hydromorphone Hcl     "body started down"  . Rifampin Nausea And Vomiting     Current Outpatient Prescriptions on File Prior to Visit  Medication Sig Dispense Refill  . aspirin 325 MG tablet Take 325 mg by mouth daily with breakfast.       . carvedilol (COREG) 25 MG tablet Take 1 tablet (25 mg total) by mouth 2 (two) times daily with a meal.  180 tablet  1  . cinacalcet (SENSIPAR) 60 MG tablet Take 60 mg by mouth daily at 12 noon. With biggest meal      . furosemide (LASIX) 80 MG tablet Take 1 tablet (80 mg total) by mouth 2 (two) times daily.  60 tablet  6  . insulin aspart (NOVOLOG) 100 UNIT/ML injection Inject 0-10  Units into the skin 3 (three) times daily with meals. Before each meal 3 times a day, 140-199 - 2 units, 200-250 - 4 units, 250-299 - 6 units, 300-349 - 8 units,  350 or above 10 units. Take along with premeal Novolog.      . insulin aspart (NOVOLOG) 100 UNIT/ML injection Inject 7 Units into the skin 3 (three) times daily before meals. Plus the sliding scale      . insulin glargine (LANTUS) 100 UNIT/ML injection Inject 7 Units into the skin 2 (two) times daily.  10 mL  5  . isosorbide mononitrate (IMDUR) 30 MG 24 hr tablet Take 1 tablet (30 mg total) by mouth 2 (two) times daily.  60 tablet  6  . levothyroxine (SYNTHROID, LEVOTHROID) 75 MCG tablet Take 75 mcg by mouth daily before breakfast.      . LYRICA 150 MG capsule TAKE 1 CAPSULE BY MOUTH EVERY MORNING AND 2 CAPSULES EVERY EVENING  90 capsule  0  . Omega-3 Fatty Acids (FISH OIL) 1000 MG CAPS Take 1,000 mg by mouth daily.       [name] omeprazole (PRILOSEC) 40 MG capsule Take one by mouth daily  30 capsule  5  . rosuvastatin (CRESTOR) 20 MG tablet Take 20 mg by mouth at bedtime.         Past Medical History  Diagnosis Date  . End stage renal disease on dialysis     LUE fistula  . Diabetes mellitus  TYPE I  . Diabetic neuropathy     sever, s/p multiple toe amputation  . Thyroid disease     hypothyroidism  . Myocardial infarction   . Hypertension   . Hyperlipidemia   . MI (myocardial infarction)   . ESRD on hemodialysis   . Heart murmur   . Hypothyroidism   . H/O hiatal hernia   . Neuromuscular disorder     Diabetic neuropathy  . GERD (gastroesophageal reflux disease)   . Anxiety   . Sebaceous cyst     side of neck  . Shortness of breath     WITH EXERTION  OR FLUID RETENTION  . Pneumonia     2010  . Anemia   . Blood transfusion     2011  . PAD (peripheral artery disease)     pt states he is scheduled for balloon angioplasty of 2 blockages in his rt leg on Wed 02/1012 at  [facility].  . Coronary artery  disease 10/2009    CABG; Hx. of MI. Cardiac cath 11/2010: severe 3 vessel CAD, patent LIMA to LAD, SVG to OM1, SVG to LPDA and SVG to RCA.  PT'S CARDIOLOGIST IS DR. Kirke Corin WITH [facility] IN [place]  . Cataract     right     Past Surgical History  Procedure Date  . Dialysis fistula creation     left upper arm fistula  . Amputation     TOES ON BOTH FEET  . Abscess drainage Behind right ear/occipital scalp  . Skin graft   . Eye surgery 1999  . Cardiac catheterization   . Coronary artery bypass graft 10/2009    [facility] (Dr. Katrinka Blazing)  . Foot amputation      Family History  Problem Relation Age of Onset  . Heart disease Other   . Hypertension Other   . Hypertension Mother   . Heart disease Mother   . Diabetes Mother   . Birth defects Paternal Uncle     unaware  . Birth defects Paternal Grandmother     breast     History   Social History  . Marital Status: Single    Spouse Name: N/A    Number of Children: N/A  . Years of Education: N/A   Occupational History  . Not on file.   Social History Main Topics  . Smoking status: Never Smoker   . Smokeless tobacco: Former Neurosurgeon  . Alcohol Use: 0.0 oz/week     Comment: occasional beer  . Drug Use: No  . Sexually Active: Not Currently   Other Topics Concern  . Not on file   Social History Narrative  . No narrative on file     PHYSICAL EXAM   BP 170/98  Pulse 91  Ht 6\' 3"  (1.905 m)  Wt 209 lb 12 oz (95.142 kg)  BMI 26.22 kg/m2 Constitutional: He is oriented to person, place, and time. He appears well-developed and well-nourished. No distress.  HENT: No nasal discharge.  Head: Normocephalic and atraumatic.  Eyes: Pupils are equal and round. Right eye exhibits no discharge. Left eye exhibits no discharge.  Neck: Normal range of motion. Neck supple. No JVD present. No thyromegaly present.  Cardiovascular: Normal rate, regular rhythm, normal heart sounds absent distal pulses. Exam reveals no gallop and no  friction rub. No murmur heard.  A loud bruit is here in the left upper chest area and left carotid artery area which seems to be due to his left  arm fistula. Pulmonary/Chest: Effort normal and breath sounds normal. No stridor. No respiratory distress. He has no wheezes. He has no rales. He exhibits no tenderness.  Abdominal: Soft. Bowel sounds are normal. He exhibits no distension. There is no tenderness. There is no rebound and no guarding.  Musculoskeletal: Normal range of motion. He exhibits trace edema and no tenderness.  Neurological: He is alert and oriented to person, place, and time. Coordination normal.  Skin: Skin is warm and dry. No rash noted. He is not diaphoretic. No erythema. No pallor.  Psychiatric: He has a normal mood and affect. His behavior is normal. Judgment and thought content normal.     EKG: Sinus  Rhythm  -Left atrial enlargement.   BORDERLINE  ASSESSMENT AND PLAN

## 2012-12-05 NOTE — Assessment & Plan Note (Signed)
The patient continues to have significant exertional dyspnea and chest discomfort. He had a prolonged episode of chest tightness few nights ago which is concerning. His noninvasive evaluation was remarkable for moderate pulmonary hypertension. In spite of his negative stress test, his current symptoms are very worrisome. He is known to have CAD with previous CABG. There is no significant fluid overload to explain his symptoms. Actually they have been removing extra fluid during dialysis sessions with no improvement in symptoms. Due to all of that, I recommend proceeding with a right and left cardiac catheterization for a definitive answer.

## 2012-12-05 NOTE — Assessment & Plan Note (Signed)
Continue treatment with rosuvastatin. 

## 2012-12-05 NOTE — Assessment & Plan Note (Signed)
His blood pressure is elevated. However, it drops during dialysis. I will consider adding a small dose amlodipine.

## 2012-12-05 NOTE — Patient Instructions (Signed)

## 2012-12-06 ENCOUNTER — Other Ambulatory Visit: Payer: Self-pay | Admitting: Internal Medicine

## 2012-12-06 LAB — CBC WITH DIFFERENTIAL
Eos: 3 % (ref 0–5)
HCT: 37 % — ABNORMAL LOW (ref 37.5–51.0)
Lymphocytes Absolute: 0.9 10*3/uL (ref 0.7–3.1)
MCV: 94 fL (ref 79–97)
Monocytes: 6 % (ref 4–12)
Neutrophils Absolute: 4.7 10*3/uL (ref 1.4–7.0)
RBC: 3.93 x10E6/uL — ABNORMAL LOW (ref 4.14–5.80)
WBC: 6.2 10*3/uL (ref 3.4–10.8)

## 2012-12-06 LAB — BASIC METABOLIC PANEL
BUN: 44 mg/dL — ABNORMAL HIGH (ref 6–24)
Chloride: 93 mmol/L — ABNORMAL LOW (ref 97–108)
GFR calc Af Amer: 11 mL/min/{1.73_m2} — ABNORMAL LOW (ref >59)
GFR calc non Af Amer: 9 mL/min/{1.73_m2} — ABNORMAL LOW (ref >59)
Glucose: 575 mg/dL (ref 65–99)

## 2012-12-06 LAB — PROTIME-INR
INR: 1.1 (ref 0.8–1.2)
Prothrombin Time: 11.4 s (ref 9.1–12.0)

## 2012-12-06 NOTE — Progress Notes (Signed)
Received critical lab value of blood glucose greater than 500.  Called patient at home.  Recheck at home was 250.  Patient without symptoms.

## 2012-12-07 ENCOUNTER — Telehealth: Payer: Self-pay | Admitting: Cardiovascular Disease

## 2012-12-07 NOTE — Telephone Encounter (Signed)
Pt informed of labs.  States that Dr. Leslie Dales follow his diabetes.  Advised pt that he should see Dr. Leslie Dales soon for follow up.  Pt agrees.

## 2012-12-07 NOTE — Telephone Encounter (Signed)
Pt rtn call re blood work , pls call

## 2012-12-09 ENCOUNTER — Other Ambulatory Visit: Payer: Self-pay | Admitting: *Deleted

## 2012-12-09 DIAGNOSIS — R0602 Shortness of breath: Secondary | ICD-10-CM

## 2012-12-09 DIAGNOSIS — R0789 Other chest pain: Secondary | ICD-10-CM

## 2012-12-10 HISTORY — PX: CARDIAC CATHETERIZATION: SHX172

## 2012-12-12 ENCOUNTER — Other Ambulatory Visit: Payer: Self-pay | Admitting: *Deleted

## 2012-12-12 MED ORDER — PREGABALIN 150 MG PO CAPS: ORAL_CAPSULE | ORAL | Status: DC

## 2012-12-12 NOTE — Telephone Encounter (Signed)
Medicine called to [facility]. 

## 2012-12-12 NOTE — Telephone Encounter (Signed)
Faxed refill request from [facility], last filled 11/08/12.

## 2012-12-13 ENCOUNTER — Other Ambulatory Visit: Payer: Self-pay | Admitting: *Deleted

## 2012-12-13 MED ORDER — ROSUVASTATIN CALCIUM 20 MG PO TABS: 20.0000 mg | ORAL_TABLET | Freq: Every day | ORAL | Status: DC

## 2012-12-16 ENCOUNTER — Ambulatory Visit: Payer: Self-pay | Admitting: Cardiovascular Disease

## 2012-12-16 DIAGNOSIS — I2510 Atherosclerotic heart disease of native coronary artery without angina pectoris: Secondary | ICD-10-CM

## 2012-12-19 ENCOUNTER — Encounter: Payer: Self-pay | Admitting: *Deleted

## 2012-12-27 ENCOUNTER — Encounter: Payer: Medicare Other | Admitting: Cardiovascular Disease

## 2013-01-03 ENCOUNTER — Encounter: Payer: Self-pay | Admitting: Cardiovascular Disease

## 2013-01-03 ENCOUNTER — Ambulatory Visit (INDEPENDENT_AMBULATORY_CARE_PROVIDER_SITE_OTHER): Payer: Medicare Other | Admitting: Cardiovascular Disease

## 2013-01-03 VITALS — BP 142/82 | HR 84 | Ht 75.0 in | Wt 210.5 lb

## 2013-01-03 DIAGNOSIS — E785 Hyperlipidemia, unspecified: Secondary | ICD-10-CM

## 2013-01-03 DIAGNOSIS — R0789 Other chest pain: Secondary | ICD-10-CM

## 2013-01-03 DIAGNOSIS — R0989 Other specified symptoms and signs involving the circulatory and respiratory systems: Secondary | ICD-10-CM

## 2013-01-03 DIAGNOSIS — N186 End stage renal disease: Secondary | ICD-10-CM

## 2013-01-03 DIAGNOSIS — Z951 Presence of aortocoronary bypass graft: Secondary | ICD-10-CM

## 2013-01-03 DIAGNOSIS — I10 Essential (primary) hypertension: Secondary | ICD-10-CM

## 2013-01-03 NOTE — Assessment & Plan Note (Signed)
Recent cardiac cath showed patent grafts with normal EF by echo. Continue medical therapy. Filling pressures were high which partially explains his dyspnea. Nephrology is trying to remove more fluid during dialysis.

## 2013-01-03 NOTE — Assessment & Plan Note (Signed)
Continue treatment with rosuvastatin given his known history of CAD and PVD.

## 2013-01-03 NOTE — Patient Instructions (Addendum)
Continue same medications.  Follow up in 6 months.  

## 2013-01-03 NOTE — Assessment & Plan Note (Signed)
Likely from left arm fistula. Normal carotid duplex us.    

## 2013-01-03 NOTE — Progress Notes (Signed)
HPI  This is a 42 year old male who is here today for a followup visit. He has known history of three-vessel coronary artery disease diagnosed in 2010. He underwent coronary artery bypass graft surgery in December of 2010 at [facility]. He had cardiac catheterization done in January of 2012 by Dr.Khan which showed patent grafts. His ejection fraction is normal. He has multiple chronic conditions that include end-stage renal disease on hemodialysis followed by Dr. Thedore Mins, diabetes, hypertension and hyperlipidemia. He also has peripheral arterial disease managed by Dr. Gilda Crease. He is status post right toes amputation . He was seen recently for worsening dyspnea, chest tightness and orthopnea.  He underwent a right and left cardiac cath which showed patent grafts. There was moderately elevated PCWP and moderate pulmonary hypertension. EF was normal by echo.  He is doing reasonably well. STill with dyspnea. They are trying to remove more fluid during dialysis but limited by hypotension. BP runs high on days off dialysis.   Allergies  Allergen Reactions  . Amoxicillin-Pot Clavulanate Nausea And Vomiting  . Hydromorphone Hcl     "body started down"  . Rifampin Nausea And Vomiting     Current Outpatient Prescriptions on File Prior to Visit  Medication Sig Dispense Refill  . aspirin 325 MG tablet Take 325 mg by mouth daily with breakfast.       . carvedilol (COREG) 25 MG tablet Take 1 tablet (25 mg total) by mouth 2 (two) times daily with a meal.  180 tablet  1  . cinacalcet (SENSIPAR) 60 MG tablet Take 60 mg by mouth daily at 12 noon. With biggest meal      . furosemide (LASIX) 80 MG tablet Take 1 tablet (80 mg total) by mouth 2 (two) times daily.  60 tablet  6  . insulin aspart (NOVOLOG) 100 UNIT/ML injection Inject 0-10 Units into the skin 3 (three) times daily with meals. Before each meal 3 times a day, 140-199 - 2 units, 200-250 - 4 units, 250-299 - 6 units, 300-349 - 8 units,  350 or above 10  units. Take along with premeal Novolog.      . insulin aspart (NOVOLOG) 100 UNIT/ML injection Inject 7 Units into the skin 3 (three) times daily before meals. Plus the sliding scale      . insulin glargine (LANTUS) 100 UNIT/ML injection Inject 7 Units into the skin 2 (two) times daily.  10 mL  5  . isosorbide mononitrate (IMDUR) 30 MG 24 hr tablet Take 1 tablet (30 mg total) by mouth 2 (two) times daily.  60 tablet  6  . levothyroxine (SYNTHROID, LEVOTHROID) 75 MCG tablet Take 75 mcg by mouth daily before breakfast.      . Omega-3 Fatty Acids (FISH OIL) 1000 MG CAPS Take 1,000 mg by mouth daily.       [name] omeprazole (PRILOSEC) 40 MG capsule Take one by mouth daily  30 capsule  5  . pregabalin (LYRICA) 150 MG capsule Take one capsule by mouth every morning and two capsules every evening.  90 capsule  0  . rosuvastatin (CRESTOR) 20 MG tablet Take 1 tablet (20 mg total) by mouth at bedtime.  30 tablet  5   No current facility-administered medications on file prior to visit.     Past Medical History  Diagnosis Date  . End stage renal disease on dialysis     LUE fistula  . Diabetes mellitus     TYPE I  . Diabetic neuropathy  sever, s/p multiple toe amputation  . Thyroid disease     hypothyroidism  . Myocardial infarction   . Hypertension   . Hyperlipidemia   . MI (myocardial infarction)   . ESRD on hemodialysis   . Heart murmur   . Hypothyroidism   . H/O hiatal hernia   . Neuromuscular disorder     Diabetic neuropathy  . GERD (gastroesophageal reflux disease)   . Anxiety   . Sebaceous cyst     side of neck  . Shortness of breath     WITH EXERTION  OR FLUID RETENTION  . Pneumonia     2010  . Anemia   . Blood transfusion     2011  . PAD (peripheral artery disease)     pt states he is scheduled for balloon angioplasty of 2 blockages in his rt leg on Wed 02/1012 at  [facility].  . Coronary artery disease 10/2009    CABG; Hx. of MI. Cardiac cath 11/2010:  severe 3 vessel CAD, patent LIMA to LAD, SVG to OM1, SVG to LPDA and SVG to RCA.  PT'S CARDIOLOGIST IS DR. Kirke Corin WITH [facility] IN [place]  . Cataract     right     Past Surgical History  Procedure Laterality Date  . Dialysis fistula creation      left upper arm fistula  . Amputation      TOES ON BOTH FEET  . Abscess drainage  Behind right ear/occipital scalp  . Skin graft    . Eye surgery  1999  . Coronary artery bypass graft  10/2009    [facility] (Dr. Katrinka Blazing)  . Foot amputation    . Cardiac catheterization    . Cardiac catheterization  2/14    [facility]: severe 3 vessel CAD with patent grafts, RHC: moderately elevated PCW and pulmonary hypertension     Family History  Problem Relation Age of Onset  . Heart disease Other   . Hypertension Other   . Hypertension Mother   . Heart disease Mother   . Diabetes Mother   . Birth defects Paternal Uncle     unaware  . Birth defects Paternal Grandmother     breast     History   Social History  . Marital Status: Single    Spouse Name: N/A    Number of Children: N/A  . Years of Education: N/A   Occupational History  . Not on file.   Social History Main Topics  . Smoking status: Never Smoker   . Smokeless tobacco: Former Neurosurgeon  . Alcohol Use: 0.0 oz/week     Comment: occasional beer  . Drug Use: No  . Sexually Active: Not Currently   Other Topics Concern  . Not on file   Social History Narrative  . No narrative on file     PHYSICAL EXAM   BP 142/82  Pulse 84  Ht 6\' 3"  (1.905 m)  Wt 210 lb 8 oz (95.482 kg)  BMI 26.31 kg/m2 Constitutional: He is oriented to person, place, and time. He appears well-developed and well-nourished. No distress.  HENT: No nasal discharge.  Head: Normocephalic and atraumatic.  Eyes: Pupils are equal and round. Right eye exhibits no discharge. Left eye exhibits no discharge.  Neck: Normal range of motion. Neck supple. No JVD present. No thyromegaly present.  Cardiovascular:  Normal rate, regular rhythm, normal heart sounds absent distal pulses. Exam reveals no gallop and no friction rub. No murmur heard.  A  loud bruit is here in the left upper chest area and left carotid artery area which seems to be due to his left arm fistula. Pulmonary/Chest: Effort normal and breath sounds normal. No stridor. No respiratory distress. He has no wheezes. He has no rales. He exhibits no tenderness.  Abdominal: Soft. Bowel sounds are normal. He exhibits no distension. There is no tenderness. There is no rebound and no guarding.  Musculoskeletal: Normal range of motion. He exhibits trace edema and no tenderness.  Neurological: He is alert and oriented to person, place, and time. Coordination normal.  Skin: Skin is warm and dry. No rash noted. He is not diaphoretic. No erythema. No pallor.  Psychiatric: He has a normal mood and affect. His behavior is normal. Judgment and thought content normal.  Right groin: No hematoma or bruit.    EKG: Sinus  Rhythm  -Left atrial enlargement.   -  Nonspecific T-abnormality.   ABNORMAL   ABNORMAL   ASSESSMENT AND PLAN

## 2013-01-03 NOTE — Assessment & Plan Note (Signed)
BP is reasonable.

## 2013-01-03 NOTE — Assessment & Plan Note (Addendum)
I discussed with the patient considering alternatives to dialysis with his nephrologist given his young age. It appears that he has been evaluated for renal transplant in the past but was deemed to be not a good candidate due to his uncontrolled diabetes. There is no contraindication for transplant from a cardiac standpoint especially with his recent cardiac catheterization showing patent grafts. He does have peripheral arterial disease which might be an issue.Taylor [name]

## 2013-01-12 ENCOUNTER — Other Ambulatory Visit: Payer: Self-pay | Admitting: *Deleted

## 2013-01-12 MED ORDER — PREGABALIN 150 MG PO CAPS: ORAL_CAPSULE | ORAL | Status: DC

## 2013-01-12 NOTE — Telephone Encounter (Signed)
Medicine called to [facility]. 

## 2013-01-12 NOTE — Telephone Encounter (Signed)
Last filled 12/12/12

## 2013-02-09 ENCOUNTER — Other Ambulatory Visit: Payer: Self-pay | Admitting: *Deleted

## 2013-02-09 MED ORDER — PREGABALIN 150 MG PO CAPS: ORAL_CAPSULE | ORAL | Status: DC

## 2013-02-09 NOTE — Telephone Encounter (Signed)
Medicine called to [facility]. 

## 2013-02-09 NOTE — Telephone Encounter (Signed)
Last filled 01/12/13. 

## 2013-02-13 ENCOUNTER — Other Ambulatory Visit: Payer: Self-pay | Admitting: *Deleted

## 2013-02-13 MED ORDER — LEVOTHYROXINE SODIUM 75 MCG PO TABS: 75.0000 ug | ORAL_TABLET | Freq: Every day | ORAL | Status: DC

## 2013-02-14 ENCOUNTER — Telehealth: Payer: Self-pay

## 2013-02-14 NOTE — Telephone Encounter (Signed)
Form faxed

## 2013-02-14 NOTE — Telephone Encounter (Signed)
Insurance is asking for more information to support patient's need for lyrica.  Form is back on your desk.

## 2013-02-14 NOTE — Telephone Encounter (Signed)
Pt notified that lyrica is prior auth. Pt said took last pill last night 02/13/13. Pt said if goes without med has DTs. Please advise.

## 2013-02-14 NOTE — Telephone Encounter (Signed)
Prior auth form is on your desk

## 2013-02-14 NOTE — Telephone Encounter (Signed)
Form on my desk

## 2013-02-15 NOTE — Telephone Encounter (Signed)
Prior auth given for lyrica, advised pharmacy.  Approval letter placed on doctor's desk for signature and scanning.

## 2013-03-16 ENCOUNTER — Other Ambulatory Visit: Payer: Self-pay | Admitting: *Deleted

## 2013-03-16 MED ORDER — PREGABALIN 150 MG PO CAPS: ORAL_CAPSULE | ORAL | Status: DC

## 2013-03-16 MED ORDER — LEVOTHYROXINE SODIUM 75 MCG PO TABS: 75.0000 ug | ORAL_TABLET | Freq: Every day | ORAL | Status: DC

## 2013-03-16 NOTE — Telephone Encounter (Signed)
Last filled 02/09/13

## 2013-03-17 ENCOUNTER — Telehealth: Payer: Self-pay | Admitting: *Deleted

## 2013-03-17 ENCOUNTER — Other Ambulatory Visit: Payer: Self-pay | Admitting: *Deleted

## 2013-03-17 MED ORDER — PREGABALIN 150 MG PO CAPS: ORAL_CAPSULE | ORAL | Status: DC

## 2013-03-17 NOTE — Telephone Encounter (Signed)
Pt request status of lyrica refill to [facility]. Spoke with Morrie Sheldon at [facility] [employer] called in Lyrica refill as instructed. Pt notified done and pt will ck his schedule and call back for appt.

## 2013-03-17 NOTE — Telephone Encounter (Signed)
Forms faxed

## 2013-03-17 NOTE — Telephone Encounter (Signed)
Form signed and on my desk. 

## 2013-03-17 NOTE — Telephone Encounter (Signed)
rx called to pharmacy 

## 2013-03-17 NOTE — Telephone Encounter (Signed)
Prior Berkley Harvey is needed for furosemide, form is on your desk.

## 2013-03-20 NOTE — Telephone Encounter (Signed)
Prior auth given for furosemide.  Approval letter [facility]ed to [facility] and placed on doctor's desk for signature and scanning.

## 2013-03-21 ENCOUNTER — Other Ambulatory Visit: Payer: Self-pay | Admitting: *Deleted

## 2013-03-21 MED ORDER — "INSULIN SYRINGE-NEEDLE U-100 31G X 5/16"" 0.5 ML MISC": Status: DC

## 2013-03-21 MED ORDER — CARVEDILOL 25 MG PO TABS: 25.0000 mg | ORAL_TABLET | Freq: Two times a day (BID) | ORAL | Status: DC

## 2013-03-21 MED ORDER — OMEPRAZOLE 40 MG PO CPDR: DELAYED_RELEASE_CAPSULE | ORAL | Status: DC

## 2013-03-21 MED ORDER — LEVOTHYROXINE SODIUM 75 MCG PO TABS: 75.0000 ug | ORAL_TABLET | Freq: Every day | ORAL | Status: DC

## 2013-03-21 MED ORDER — INSULIN GLARGINE 100 UNIT/ML ~~LOC~~ SOLN: 7.0000 [IU] | Freq: Two times a day (BID) | SUBCUTANEOUS | Status: DC

## 2013-03-21 MED ORDER — INSULIN ASPART 100 UNIT/ML ~~LOC~~ SOLN: 7.0000 [IU] | Freq: Three times a day (TID) | SUBCUTANEOUS | Status: DC

## 2013-03-29 ENCOUNTER — Telehealth: Payer: Self-pay

## 2013-03-29 NOTE — Telephone Encounter (Signed)
See note below.  Form is on your desk to change to something else, they are suggesting humalog.

## 2013-03-29 NOTE — Telephone Encounter (Signed)
Taylor [name] with [facility] pharmacy left v/m that Novolog injection is not covered by insurance and request substitute med.Please advise.

## 2013-03-30 NOTE — Telephone Encounter (Signed)
Form completed and on my desk. 

## 2013-03-30 NOTE — Telephone Encounter (Signed)
Form faxed, copy sent for scanning. 

## 2013-04-11 ENCOUNTER — Telehealth: Payer: Self-pay

## 2013-04-11 NOTE — Telephone Encounter (Signed)
Mr Maisie Fus pharmacist from [facility] left v/m that rx for Lyrica 150 mg one every morning and 2 every evening was sent to local pharmacy,[facility] and was placed on hold at [facility]. Lyrica was transferred to [facility] pharmacy and pharmacist wants to ck on dosage; The dose is higher than usual; normal is 25 -50 mg daily for kidney failure pts and post dialysis dose recommended also; pharmacist wants to verify dose; call [phone] ref- # [id number].Please advise.

## 2013-04-11 NOTE — Telephone Encounter (Signed)
Lab Results  Component Value Date   CREATININE 6.61* 12/05/2012  I do see that pt has been on lyrica this high dose for several months, prior on 150mg  bid.  I do want Dr. Roberts Gaudy input on this.  Will route to her.

## 2013-04-11 NOTE — Telephone Encounter (Signed)
Per chart patient has been on this dose for quite some time. Pharmacy notified.

## 2013-04-11 NOTE — Telephone Encounter (Signed)
Pharmacist left v/m requesting call back ASAP re; dosing for Lyrica since pt is dialysis pt.Please advise.

## 2013-04-12 MED ORDER — GABAPENTIN 300 MG PO CAPS: ORAL_CAPSULE | ORAL | Status: DC

## 2013-04-12 NOTE — Telephone Encounter (Signed)
Patient wants to know if okay to change to this and how does he wean off the lyrica

## 2013-04-12 NOTE — Telephone Encounter (Signed)
Yes I will send rx for gabapentin to his pharmacy.   To wean of lyrica take 1 tablet twice daily for three days, then 1 tablet daily for 4 days, then 1 tablet every other day for one week and stop.

## 2013-04-12 NOTE — Telephone Encounter (Signed)
Patient says that he doesn't think he can decrease or go off the medication because when it was lower he had so much burning and tingling in his feet. Patient wants to know if you can try another medication the will work the same way.

## 2013-04-12 NOTE — Addendum Note (Signed)
Addended by: Dianne Dun on: 04/12/2013 02:34 PM   Modules accepted: Orders

## 2013-04-12 NOTE — Telephone Encounter (Signed)
Patient advised and will call with any questions and updates

## 2013-04-12 NOTE — Telephone Encounter (Signed)
Gabapentin or neurontin is only other "nerve" type medication.

## 2013-04-12 NOTE — Telephone Encounter (Signed)
This dose was adjusted by one of his other physicians and I was refilling it.  I agree that it is a high dose, which I have discussed with Taylor [name].  If his pain tolerates, I am more than comfortable with decreasing his dose.  Please call Taylor [name] about this.

## 2013-04-18 ENCOUNTER — Telehealth: Payer: Self-pay

## 2013-04-18 NOTE — Telephone Encounter (Signed)
Spoke with rep with [facility], they will take care of prior auth.

## 2013-04-18 NOTE — Telephone Encounter (Signed)
Irving Shows with [facility] pharmacy request diagnosis code for gabapentin since instructions for med include taking after dialysis.Please advise.

## 2013-04-18 NOTE — Telephone Encounter (Signed)
Diabetic neuropathy

## 2013-04-27 ENCOUNTER — Telehealth: Payer: Self-pay

## 2013-04-27 NOTE — Telephone Encounter (Signed)
Pt left v/m requesting call back; that was entire message.left v/m for pt to call back.

## 2013-04-28 MED ORDER — AMITRIPTYLINE HCL 25 MG PO TABS: 25.0000 mg | ORAL_TABLET | Freq: Every day | ORAL | Status: DC

## 2013-04-28 NOTE — Telephone Encounter (Signed)
Advised patient.  He asked if ok to take one gabapentin twice a day until the amitriptyline arrives in the mail.  Per Dr. [name], advised ok, but dose is higher than what he should be taking.

## 2013-04-28 NOTE — Telephone Encounter (Signed)
Left message asking pt to call back. 

## 2013-04-28 NOTE — Telephone Encounter (Addendum)
Changed med from Lyrica to Gabapentin and Gabapentin is not effective. Thayer Ohm wants substitute med for Gabapentin; hands and feet are numb and difficult to use hands. [facility] mail order pharmacy.Please advise.

## 2013-04-28 NOTE — Addendum Note (Signed)
Addended by: Dianne Dun on: 04/28/2013 11:59 AM   Modules accepted: Orders

## 2013-04-28 NOTE — Telephone Encounter (Signed)
I would have liked to try cymbalta but not safe with renal disease. We could try amitryptiline.  Rx sent to pharmacy.

## 2013-04-29 ENCOUNTER — Emergency Department: Payer: Self-pay | Admitting: Internal Medicine

## 2013-05-02 ENCOUNTER — Telehealth: Payer: Self-pay | Admitting: *Deleted

## 2013-05-02 NOTE — Telephone Encounter (Signed)
Received fax from pt's pharmacy stating amitryptyline isnt covered on his insurance.  They are asking to change to nortriptyline or they will try to get a prior auth.  Form is on your desk.

## 2013-05-02 NOTE — Telephone Encounter (Signed)
I would like for them to get prior auth for amitriptyline.

## 2013-05-02 NOTE — Telephone Encounter (Signed)
Faxed form back to pharmacy with note to get prior auth.

## 2013-05-08 NOTE — Telephone Encounter (Signed)
Prior auth form for amitriptyline is on your desk.

## 2013-05-09 NOTE — Telephone Encounter (Signed)
Explanation given.  Form on my desk.

## 2013-05-09 NOTE — Telephone Encounter (Signed)
Forms faxed

## 2013-05-09 NOTE — Telephone Encounter (Signed)
Medicare wants explanation on form as to why pt needs amitriptyline instead of something else, form is back on your desk.

## 2013-05-09 NOTE — Telephone Encounter (Signed)
Form signed and on my desk. 

## 2013-05-10 NOTE — Telephone Encounter (Signed)
Prior auth given for amitriptyline. Approval letter placed on doctor's desk for signature and scanning.

## 2013-06-26 ENCOUNTER — Other Ambulatory Visit: Payer: Self-pay | Admitting: *Deleted

## 2013-06-26 MED ORDER — INSULIN LISPRO 100 UNIT/ML ~~LOC~~ SOLN: SUBCUTANEOUS | Status: DC

## 2013-06-26 MED ORDER — OMEPRAZOLE 40 MG PO CPDR: DELAYED_RELEASE_CAPSULE | ORAL | Status: DC

## 2013-06-26 MED ORDER — GABAPENTIN 300 MG PO CAPS: ORAL_CAPSULE | ORAL | Status: DC

## 2013-06-26 NOTE — Telephone Encounter (Signed)
Faxed refill requests from [facility] for humalog.  Per chart pt takes novalog, but pt states he was changed the last time that he was in the hospital.  He says either one works for him, so whatever you want to approve his fine with him.  They are also requesting a refill on gabapentin, which he no longer takes but he said that he needs something in addition to the amitriptyline and says that he did have some gabapentin left over, took that along with the amitriptyline and it worked well.  Please advise.  Request forms are on your desk.  He also said that he will call back to schedule follow up visit with you for next week.

## 2013-06-26 NOTE — Telephone Encounter (Signed)
Forms faxed back to [facility].

## 2013-06-26 NOTE — Telephone Encounter (Signed)
Both ok with me.  Signed and on my desk.

## 2013-07-06 ENCOUNTER — Encounter: Payer: Self-pay | Admitting: Family Medicine

## 2013-07-06 ENCOUNTER — Ambulatory Visit (INDEPENDENT_AMBULATORY_CARE_PROVIDER_SITE_OTHER): Payer: Medicare Other | Admitting: Family Medicine

## 2013-07-06 VITALS — BP 120/60 | HR 83 | Temp 98.1°F | Ht 63.0 in | Wt 180.0 lb

## 2013-07-06 DIAGNOSIS — E1149 Type 2 diabetes mellitus with other diabetic neurological complication: Secondary | ICD-10-CM

## 2013-07-06 DIAGNOSIS — E109 Type 1 diabetes mellitus without complications: Secondary | ICD-10-CM

## 2013-07-06 DIAGNOSIS — E1142 Type 2 diabetes mellitus with diabetic polyneuropathy: Secondary | ICD-10-CM

## 2013-07-06 DIAGNOSIS — E039 Hypothyroidism, unspecified: Secondary | ICD-10-CM

## 2013-07-06 DIAGNOSIS — N529 Male erectile dysfunction, unspecified: Secondary | ICD-10-CM

## 2013-07-06 LAB — HEMOGLOBIN A1C: Hgb A1c MFr Bld: 14 % — ABNORMAL HIGH (ref 4.6–6.5)

## 2013-07-06 NOTE — Progress Notes (Signed)
Patient ID: Taylor [name], male    DOB: [birth year], 42 y.o.   MRN: [id number]  HPI Comments: Taylor [name] is a pleasant Gentleman with history of coronary artery disease, bypass surgery in December 2010 at [facility], poorly controlled diabetes, hypertension, end-stage renal disease on dialysis on Tuesday, Thursday and Saturday here for follow up.  Has had a rough month.  His girlfriend of 9 years left him because his health was "too much for him."  He became very depressed and lost 30 pounds.  His appetite is coming back now and he is feeling better.  His brother is living with him.  He denies any depression or SI currently.  Wt Readings from Last 3 Encounters:  07/06/13 180 lb (81.647 kg)  01/03/13 210 lb 8 oz (95.482 kg)  12/05/12 209 lb 12 oz (95.142 kg)     DM- on Lantus 18 units qhs with SSI.  CBGs running low and high.  Seeing endocrinology.   Per pt, last a1c was 13.1 but has not been seen in a long time. Lab Results  Component Value Date   HGBA1C 15.5* 01/04/2012   Hypothyroidism- unsure when endocrinology last checked his thyroid studies. Lab Results  Component Value Date   TSH 10.138* 04/21/2011   He would like to be referred to urology- wants to go ahead and look into a penile pump for ED.  Patient Active Problem List   Diagnosis Date Noted  . PNA (pneumonia) 04/18/2012  . Left carotid bruit 02/08/2012  . Sebaceous cyst, neck 02/02/2012  . Diabetic hyperosmolar non-ketotic state 01/04/2012  . Type 1 diabetes mellitus with diabetic foot ulcer 01/04/2012  . Diabetic peripheral neuropathy 01/04/2012  . Hyponatremia 01/04/2012  . Abscess and cellulitis 11/23/2011  . Scrotal abscess 05/21/2011  . DKA (diabetic ketoacidoses) 05/21/2011  . Panic attacks 05/21/2011  . S/P CABG (coronary artery bypass graft) 02/11/2011  . HTN (hypertension) 02/11/2011  . HYPOTHYROIDISM 01/26/2011  . DIABETES MELLITUS, TYPE I 01/26/2011  . HYPERLIPIDEMIA 01/26/2011  . MYOCARDIAL  INFARCTION, HX OF 01/26/2011  . End stage renal disease 01/26/2011   Past Medical History  Diagnosis Date  . End stage renal disease on dialysis     LUE fistula  . Diabetes mellitus     TYPE I  . Diabetic neuropathy     sever, s/p multiple toe amputation  . Thyroid disease     hypothyroidism  . Myocardial infarction   . Hypertension   . Hyperlipidemia   . MI (myocardial infarction)   . ESRD on hemodialysis   . Heart murmur   . Hypothyroidism   . H/O hiatal hernia   . Neuromuscular disorder     Diabetic neuropathy  . GERD (gastroesophageal reflux disease)   . Anxiety   . Sebaceous cyst     side of neck  . Shortness of breath     WITH EXERTION  OR FLUID RETENTION  . Pneumonia     2010  . Anemia   . Blood transfusion     2011  . PAD (peripheral artery disease)     pt states he is scheduled for balloon angioplasty of 2 blockages in his rt leg on Wed 02/1012 at  [facility].  . Coronary artery disease 10/2009    CABG; Hx. of MI. Cardiac cath 11/2010: severe 3 vessel CAD, patent LIMA to LAD, SVG to OM1, SVG to LPDA and SVG to RCA.  PT'S CARDIOLOGIST IS DR. Kirke Corin WITH [facility] IN  [place]  . Cataract     right   Past Surgical History  Procedure Laterality Date  . Dialysis fistula creation      left upper arm fistula  . Amputation      TOES ON BOTH FEET  . Abscess drainage  Behind right ear/occipital scalp  . Skin graft    . Eye surgery  1999  . Coronary artery bypass graft  10/2009    [facility] (Dr. Katrinka Blazing)  . Foot amputation    . Cardiac catheterization    . Cardiac catheterization  2/14    [facility]: severe 3 vessel CAD with patent grafts, RHC: moderately elevated PCW and pulmonary hypertension   History  Substance Use Topics  . Smoking status: Never Smoker   . Smokeless tobacco: Former Neurosurgeon  . Alcohol Use: 0.0 oz/week     Comment: occasional beer   Family History  Problem Relation Age of Onset  . Heart disease Other   . Hypertension  Other   . Hypertension Mother   . Heart disease Mother   . Diabetes Mother   . Birth defects Paternal Uncle     unaware  . Birth defects Paternal Grandmother     breast   Allergies  Allergen Reactions  . Amoxicillin-Pot Clavulanate Nausea And Vomiting  . Hydromorphone Hcl     "body started down"  . Rifampin Nausea And Vomiting   Current Outpatient Prescriptions on File Prior to Visit  Medication Sig Dispense Refill  . amitriptyline (ELAVIL) 25 MG tablet Take 1 tablet (25 mg total) by mouth at bedtime.  90 tablet  3  . aspirin 325 MG tablet Take 325 mg by mouth daily with breakfast.       . carvedilol (COREG) 25 MG tablet Take 1 tablet (25 mg total) by mouth 2 (two) times daily with a meal. * Needs appointment with Dr for any additional refills*  180 tablet  0  . cinacalcet (SENSIPAR) 60 MG tablet Take 60 mg by mouth daily at 12 noon. With biggest meal      . furosemide (LASIX) 80 MG tablet Take 1 tablet (80 mg total) by mouth 2 (two) times daily.  60 tablet  6  . insulin aspart (NOVOLOG) 100 UNIT/ML injection Inject 0-10 Units into the skin 3 (three) times daily with meals. Before each meal 3 times a day, 140-199 - 2 units, 200-250 - 4 units, 250-299 - 6 units, 300-349 - 8 units,  350 or above 10 units. Take along with premeal Novolog.      . insulin aspart (NOVOLOG) 100 UNIT/ML injection Inject 7 Units into the skin 3 (three) times daily before meals. Plus the sliding scale* Needs appointment *  20 mL  0  . insulin glargine (LANTUS) 100 UNIT/ML injection Inject 0.07 mLs (7 Units total) into the skin 2 (two) times daily. * Needs appointment for additional refills*  20 mL  0  . insulin lispro (HUMALOG) 100 UNIT/ML injection Inject 7 units [facility] 3 times a day before meals and per sliding scale insulin.  20 mL  5  . Insulin Syringe-Needle U-100 31G X 5/16" 0.5 ML MISC Use 5 times daily as directed * Needs appointment with Dr for any additional refills*  450 each  0  . isosorbide mononitrate  (IMDUR) 30 MG 24 hr tablet Take 1 tablet (30 mg total) by mouth 2 (two) times daily.  60 tablet  6  . levothyroxine (SYNTHROID, LEVOTHROID) 75 MCG tablet Take 1 tablet (75 mcg total)  by mouth daily before breakfast. *Needs appointment with Dr for additional quantity and refills*  90 tablet  0  . Omega-3 Fatty Acids (FISH OIL) 1000 MG CAPS Take 1,000 mg by mouth daily.       [name] omeprazole (PRILOSEC) 40 MG capsule Take one by mouth daily  90 capsule  3  . pregabalin (LYRICA) 150 MG capsule Take one capsule by mouth every morning and two capsules every evening.  90 capsule  0  . rosuvastatin (CRESTOR) 20 MG tablet Take 1 tablet (20 mg total) by mouth at bedtime.  30 tablet  5   No current facility-administered medications on file prior to visit.   The PMH, PSH, Social History, Family History, Medications, and allergies have been reviewed in [facility], and have been updated if relevant.   ROS: See HPI  Patient Active Problem List   Diagnosis Date Noted  . PNA (pneumonia) 04/18/2012  . Left carotid bruit 02/08/2012  . Sebaceous cyst, neck 02/02/2012  . Diabetic hyperosmolar non-ketotic state 01/04/2012  . Type 1 diabetes mellitus with diabetic foot ulcer 01/04/2012  . Diabetic peripheral neuropathy 01/04/2012  . Hyponatremia 01/04/2012  . Abscess and cellulitis 11/23/2011  . Scrotal abscess 05/21/2011  . DKA (diabetic ketoacidoses) 05/21/2011  . Panic attacks 05/21/2011  . S/P CABG (coronary artery bypass graft) 02/11/2011  . HTN (hypertension) 02/11/2011  . HYPOTHYROIDISM 01/26/2011  . DIABETES MELLITUS, TYPE I 01/26/2011  . HYPERLIPIDEMIA 01/26/2011  . MYOCARDIAL INFARCTION, HX OF 01/26/2011  . End stage renal disease 01/26/2011   Past Medical History  Diagnosis Date  . End stage renal disease on dialysis     LUE fistula  . Diabetes mellitus     TYPE I  . Diabetic neuropathy     sever, s/p multiple toe amputation  . Thyroid disease     hypothyroidism  . Myocardial infarction   .  Hypertension   . Hyperlipidemia   . MI (myocardial infarction)   . ESRD on hemodialysis   . Heart murmur   . Hypothyroidism   . H/O hiatal hernia   . Neuromuscular disorder     Diabetic neuropathy  . GERD (gastroesophageal reflux disease)   . Anxiety   . Sebaceous cyst     side of neck  . Shortness of breath     WITH EXERTION  OR FLUID RETENTION  . Pneumonia     2010  . Anemia   . Blood transfusion     2011  . PAD (peripheral artery disease)     pt states he is scheduled for balloon angioplasty of 2 blockages in his rt leg on Wed 02/1012 at  [facility].  . Coronary artery disease 10/2009    CABG; Hx. of MI. Cardiac cath 11/2010: severe 3 vessel CAD, patent LIMA to LAD, SVG to OM1, SVG to LPDA and SVG to RCA.  PT'S CARDIOLOGIST IS DR. Kirke Corin WITH [facility] IN [place]  . Cataract     right   Past Surgical History  Procedure Laterality Date  . Dialysis fistula creation      left upper arm fistula  . Amputation      TOES ON BOTH FEET  . Abscess drainage  Behind right ear/occipital scalp  . Skin graft    . Eye surgery  1999  . Coronary artery bypass graft  10/2009    [facility] (Dr. Katrinka Blazing)  . Foot amputation    . Cardiac catheterization    . Cardiac catheterization  2/14    [facility]: severe 3 vessel CAD with patent grafts, RHC: moderately elevated PCW and pulmonary hypertension   History  Substance Use Topics  . Smoking status: Never Smoker   . Smokeless tobacco: Former Neurosurgeon  . Alcohol Use: 0.0 oz/week     Comment: occasional beer   Family History  Problem Relation Age of Onset  . Heart disease Other   . Hypertension Other   . Hypertension Mother   . Heart disease Mother   . Diabetes Mother   . Birth defects Paternal Uncle     unaware  . Birth defects Paternal Grandmother     breast   Allergies  Allergen Reactions  . Amoxicillin-Pot Clavulanate Nausea And Vomiting  . Hydromorphone Hcl     "body started down"  . Rifampin Nausea And  Vomiting   Current Outpatient Prescriptions on File Prior to Visit  Medication Sig Dispense Refill  . amitriptyline (ELAVIL) 25 MG tablet Take 1 tablet (25 mg total) by mouth at bedtime.  90 tablet  3  . aspirin 325 MG tablet Take 325 mg by mouth daily with breakfast.       . carvedilol (COREG) 25 MG tablet Take 1 tablet (25 mg total) by mouth 2 (two) times daily with a meal. * Needs appointment with Dr for any additional refills*  180 tablet  0  . cinacalcet (SENSIPAR) 60 MG tablet Take 60 mg by mouth daily at 12 noon. With biggest meal      . furosemide (LASIX) 80 MG tablet Take 1 tablet (80 mg total) by mouth 2 (two) times daily.  60 tablet  6  . insulin aspart (NOVOLOG) 100 UNIT/ML injection Inject 0-10 Units into the skin 3 (three) times daily with meals. Before each meal 3 times a day, 140-199 - 2 units, 200-250 - 4 units, 250-299 - 6 units, 300-349 - 8 units,  350 or above 10 units. Take along with premeal Novolog.      . insulin aspart (NOVOLOG) 100 UNIT/ML injection Inject 7 Units into the skin 3 (three) times daily before meals. Plus the sliding scale* Needs appointment *  20 mL  0  . insulin glargine (LANTUS) 100 UNIT/ML injection Inject 0.07 mLs (7 Units total) into the skin 2 (two) times daily. * Needs appointment for additional refills*  20 mL  0  . insulin lispro (HUMALOG) 100 UNIT/ML injection Inject 7 units [place] 3 times a day before meals and per sliding scale insulin.  20 mL  5  . Insulin Syringe-Needle U-100 31G X 5/16" 0.5 ML MISC Use 5 times daily as directed * Needs appointment with Dr for any additional refills*  450 each  0  . isosorbide mononitrate (IMDUR) 30 MG 24 hr tablet Take 1 tablet (30 mg total) by mouth 2 (two) times daily.  60 tablet  6  . levothyroxine (SYNTHROID, LEVOTHROID) 75 MCG tablet Take 1 tablet (75 mcg total) by mouth daily before breakfast. *Needs appointment with Dr for additional quantity and refills*  90 tablet  0  . Omega-3 Fatty Acids (FISH OIL) 1000 MG  CAPS Take 1,000 mg by mouth daily.       [name] omeprazole (PRILOSEC) 40 MG capsule Take one by mouth daily  90 capsule  3  . pregabalin (LYRICA) 150 MG capsule Take one capsule by mouth every morning and two capsules every evening.  90 capsule  0  . rosuvastatin (CRESTOR) 20 MG tablet Take 1 tablet (20 mg total) by mouth  at bedtime.  30 tablet  5   No current facility-administered medications on file prior to visit.   The PMH, PSH, Social History, Family History, Medications, and allergies have been reviewed in [facility], and have been updated if relevant.   Physical Exam  BP 120/60  Pulse 83  Temp(Src) 98.1 F (36.7 C)  Ht 5\' 3"  (1.6 m)  Wt 180 lb (81.647 kg)  BMI 31.89 kg/m2 Wt Readings from Last 3 Encounters:  07/06/13 180 lb (81.647 kg)  01/03/13 210 lb 8 oz (95.482 kg)  12/05/12 209 lb 12 oz (95.142 kg)     Constitutional: He is oriented to person, place, and time.  HENT:  Head: Normocephalic.  Nose: Nose normal.  Cardiovascular: Normal rate, regular rhythm, S1 normal, S2 normal and intact distal pulses.  Exam reveals no gallop and no friction rub.   Murmur heard.  Systolic murmur is present with a grade of 2/6  Pulmonary/Chest: Effort normal and breath sounds normal. No respiratory distress. He has no wheezes. He has no rales. He exhibits no tenderness.  Neurological: He is alert and oriented to person, place, and time. Coordination normal.     Psychiatric: He has a normal mood and affect. His behavior is normal. Judgment and thought content normal.         Assessment and Plan  1. DIABETES MELLITUS, TYPE I Has been poorly controlled and he has not been compliant with keeping his appts with Dr. Leslie Dales.  On questioning, he does admit that coming here would be easier.  Will refer to Dr. Elvera Lennox in our office. Check a1c today. - Hemoglobin A1c - Ambulatory referral to Endocrinology  2. Diabetic peripheral neuropathy Persistent issue.  On maximum pharmacological therapy  considering his comorbidities, such as end stage renal disease.  3. HYPOTHYROIDISM No changes to rx for now- check thyroid panel. - TSH - T4, Free  4. Erectile dysfunction  - Ambulatory referral to Urology

## 2013-07-06 NOTE — Patient Instructions (Addendum)
Good to see you. We will call your referrals- urology and endocrinology ( Dr. Elvera Lennox).    We will call you with your lab results.

## 2013-07-07 ENCOUNTER — Encounter: Payer: Self-pay | Admitting: Family Medicine

## 2013-07-07 ENCOUNTER — Ambulatory Visit: Payer: Medicare Other | Admitting: Cardiovascular Disease

## 2013-07-07 LAB — TSH: TSH: 1.91 u[IU]/mL (ref 0.35–5.50)

## 2013-07-12 ENCOUNTER — Other Ambulatory Visit: Payer: Self-pay | Admitting: *Deleted

## 2013-07-12 ENCOUNTER — Telehealth: Payer: Self-pay | Admitting: *Deleted

## 2013-07-12 MED ORDER — INSULIN GLARGINE 100 UNIT/ML ~~LOC~~ SOLN
7.0000 [IU] | Freq: Two times a day (BID) | SUBCUTANEOUS | Status: DC
Start: 2013-07-12 — End: 2013-07-20

## 2013-07-12 NOTE — Telephone Encounter (Signed)
Received refill request for lantus, pt is not scheduled to see Dr Elvera Lennox until October. Is this ok to refill with the same directions?

## 2013-07-12 NOTE — Telephone Encounter (Signed)
Received PA approval for Gabapentin. I don't see where this was prescribed. I think it may have been sent here in error, but the letter is in your IN box for review.

## 2013-07-20 ENCOUNTER — Other Ambulatory Visit: Payer: Self-pay | Admitting: *Deleted

## 2013-07-20 MED ORDER — INSULIN GLARGINE 100 UNIT/ML ~~LOC~~ SOLN: 7.0000 [IU] | Freq: Two times a day (BID) | SUBCUTANEOUS | Status: DC

## 2013-07-26 ENCOUNTER — Other Ambulatory Visit: Payer: Self-pay | Admitting: *Deleted

## 2013-07-26 MED ORDER — LEVOTHYROXINE SODIUM 75 MCG PO TABS: 75.0000 ug | ORAL_TABLET | Freq: Every day | ORAL | Status: DC

## 2013-08-04 ENCOUNTER — Other Ambulatory Visit: Payer: Self-pay

## 2013-08-04 NOTE — Telephone Encounter (Signed)
[facility] pharmacy left v/m requesting refill lantus 100 ml which would be 56 day supply. Injects [place] 7 units twice daily. Ref # [id number].Please advise. (20 ml was sent to [facility] on 07/20/13).

## 2013-08-07 ENCOUNTER — Other Ambulatory Visit: Payer: Self-pay | Admitting: *Deleted

## 2013-08-07 MED ORDER — CARVEDILOL 25 MG PO TABS: 25.0000 mg | ORAL_TABLET | Freq: Two times a day (BID) | ORAL | Status: DC

## 2013-08-07 MED ORDER — INSULIN GLARGINE 100 UNIT/ML ~~LOC~~ SOLN: 7.0000 [IU] | Freq: Two times a day (BID) | SUBCUTANEOUS | Status: DC

## 2013-08-07 NOTE — Telephone Encounter (Signed)
Spoke to [place] at [facility] and refill given with one refill.

## 2013-08-07 NOTE — Telephone Encounter (Signed)
Ok to refill as requested 

## 2013-08-16 ENCOUNTER — Ambulatory Visit: Payer: Medicare Other | Admitting: Family Medicine

## 2013-08-23 ENCOUNTER — Ambulatory Visit: Payer: Medicare Other | Admitting: Internal Medicine

## 2013-08-23 DIAGNOSIS — Z0289 Encounter for other administrative examinations: Secondary | ICD-10-CM

## 2013-09-14 ENCOUNTER — Other Ambulatory Visit: Payer: Self-pay

## 2013-09-28 NOTE — Op Note (Signed)
General Surgery - [facility]  Asked to co-sign note from over 1 year ago.  No memory of this patient.  Nestor Wieneke M. Nusayba Cadenas, MD, FACS [facility] Office: [phone]   

## 2013-09-29 LAB — HM DIABETES EYE EXAM

## 2013-12-06 ENCOUNTER — Other Ambulatory Visit: Payer: Self-pay | Admitting: Family Medicine

## 2013-12-07 NOTE — Telephone Encounter (Signed)
Pt requesting medication refill. Last ov 06/2013 with labs drawn at that time; no future appts scheduled. pls advise

## 2014-01-17 ENCOUNTER — Other Ambulatory Visit: Payer: Self-pay | Admitting: Family Medicine

## 2014-01-18 NOTE — Telephone Encounter (Signed)
Pt requesting medication refill. Last ov 06/2013 with no future appts scheduled. pls advise 

## 2014-03-16 LAB — COMPREHENSIVE METABOLIC PANEL
ALK PHOS: 304 U/L — AB
ANION GAP: 9 (ref 7–16)
AST: 30 U/L (ref 15–37)
Albumin: 2.7 g/dL — ABNORMAL LOW (ref 3.4–5.0)
BILIRUBIN TOTAL: 0.8 mg/dL (ref 0.2–1.0)
BUN: 51 mg/dL — ABNORMAL HIGH (ref 7–18)
CHLORIDE: 84 mmol/L — AB (ref 98–107)
Calcium, Total: 8.8 mg/dL (ref 8.5–10.1)
Co2: 24 mmol/L (ref 21–32)
Creatinine: 5.56 mg/dL — ABNORMAL HIGH (ref 0.60–1.30)
EGFR (African American): 13 — ABNORMAL LOW
GFR CALC NON AF AMER: 11 — AB
GLUCOSE: 942 mg/dL — AB (ref 65–99)
Osmolality: 297 (ref 275–301)
Potassium: 5.7 mmol/L — ABNORMAL HIGH (ref 3.5–5.1)
SGPT (ALT): 36 U/L (ref 12–78)
Sodium: 117 mmol/L — CL (ref 136–145)
Total Protein: 7.4 g/dL (ref 6.4–8.2)

## 2014-03-16 LAB — URINALYSIS, COMPLETE
BACTERIA: NONE SEEN
Glucose,UR: >=500 mg/dL (ref 0–75)
Leukocyte Esterase: NEGATIVE
Nitrite: NEGATIVE
Ph: 8 (ref 4.5–8.0)
Protein: 100
RBC,UR: 4 /HPF (ref 0–5)
Specific Gravity: 1.015 (ref 1.003–1.030)
Squamous Epithelial: <1
WBC UR: 1 /HPF (ref 0–5)

## 2014-03-16 LAB — CBC
HCT: 32.8 % — ABNORMAL LOW (ref 40.0–52.0)
HGB: 9.7 g/dL — AB (ref 13.0–18.0)
MCH: 28.5 pg (ref 26.0–34.0)
MCHC: 29.5 g/dL — AB (ref 32.0–36.0)
MCV: 97 fL (ref 80–100)
Platelet: 109 10*3/uL — ABNORMAL LOW (ref 150–440)
RBC: 3.39 10*6/uL — ABNORMAL LOW (ref 4.40–5.90)
RDW: 16.1 % — ABNORMAL HIGH (ref 11.5–14.5)
WBC: 5.4 10*3/uL (ref 3.8–10.6)

## 2014-03-16 LAB — SEDIMENTATION RATE: Erythrocyte Sed Rate: 51 mm/hr — ABNORMAL HIGH (ref 0–15)

## 2014-03-17 ENCOUNTER — Inpatient Hospital Stay: Payer: Self-pay | Admitting: Specialist

## 2014-03-17 LAB — BASIC METABOLIC PANEL
ANION GAP: 10 (ref 7–16)
Anion Gap: 11 (ref 7–16)
Anion Gap: 9 (ref 7–16)
BUN: 54 mg/dL — ABNORMAL HIGH (ref 7–18)
BUN: 54 mg/dL — ABNORMAL HIGH (ref 7–18)
BUN: 54 mg/dL — ABNORMAL HIGH (ref 7–18)
CALCIUM: 8.7 mg/dL (ref 8.5–10.1)
CALCIUM: 8.6 mg/dL (ref 8.5–10.1)
CHLORIDE: 91 mmol/L — AB (ref 98–107)
CO2: 25 mmol/L (ref 21–32)
CO2: 26 mmol/L (ref 21–32)
CREATININE: 5.68 mg/dL — AB (ref 0.60–1.30)
CREATININE: 5.52 mg/dL — AB (ref 0.60–1.30)
Calcium, Total: 9 mg/dL (ref 8.5–10.1)
Chloride: 87 mmol/L — ABNORMAL LOW (ref 98–107)
Chloride: 93 mmol/L — ABNORMAL LOW (ref 98–107)
Co2: 24 mmol/L (ref 21–32)
Creatinine: 5.85 mg/dL — ABNORMAL HIGH (ref 0.60–1.30)
EGFR (African American): 12 — ABNORMAL LOW
EGFR (African American): 13 — ABNORMAL LOW
EGFR (Non-African Amer.): 11 — ABNORMAL LOW
EGFR (Non-African Amer.): 11 — ABNORMAL LOW
GFR CALC AF AMER: 13 — AB
GFR CALC NON AF AMER: 11 — AB
Glucose: 709 mg/dL (ref 65–99)
Glucose: 295 mg/dL — ABNORMAL HIGH (ref 65–99)
Glucose: 157 mg/dL — ABNORMAL HIGH (ref 65–99)
OSMOLALITY: 283 (ref 275–301)
OSMOLALITY: 271 (ref 275–301)
Osmolality: 295 (ref 275–301)
POTASSIUM: 5.2 mmol/L — AB (ref 3.5–5.1)
POTASSIUM: 4.8 mmol/L (ref 3.5–5.1)
Potassium: 4.2 mmol/L (ref 3.5–5.1)
SODIUM: 122 mmol/L — AB (ref 136–145)
SODIUM: 128 mmol/L — AB (ref 136–145)
Sodium: 126 mmol/L — ABNORMAL LOW (ref 136–145)

## 2014-03-17 LAB — PHOSPHORUS: Phosphorus: 4.8 mg/dL (ref 2.5–4.9)

## 2014-03-17 LAB — HEMOGLOBIN A1C: Hemoglobin A1C: 12.3 % — ABNORMAL HIGH (ref 4.2–6.3)

## 2014-03-18 LAB — BASIC METABOLIC PANEL
Anion Gap: 7 (ref 7–16)
BUN: 31 mg/dL — ABNORMAL HIGH (ref 7–18)
CREATININE: 3.97 mg/dL — AB (ref 0.60–1.30)
Calcium, Total: 8.2 mg/dL — ABNORMAL LOW (ref 8.5–10.1)
Chloride: 93 mmol/L — ABNORMAL LOW (ref 98–107)
Co2: 31 mmol/L (ref 21–32)
EGFR (Non-African Amer.): 17 — ABNORMAL LOW
GFR CALC AF AMER: 20 — AB
Glucose: 274 mg/dL — ABNORMAL HIGH (ref 65–99)
OSMOLALITY: 279 (ref 275–301)
Potassium: 4.4 mmol/L (ref 3.5–5.1)
Sodium: 131 mmol/L — ABNORMAL LOW (ref 136–145)

## 2014-03-18 LAB — PLATELET COUNT: Platelet: 110 10*3/uL — ABNORMAL LOW (ref 150–440)

## 2014-03-19 LAB — PHOSPHORUS: Phosphorus: 3.9 mg/dL (ref 2.5–4.9)

## 2014-03-20 LAB — PHOSPHORUS: Phosphorus: 3.4 mg/dL (ref 2.5–4.9)

## 2014-03-26 ENCOUNTER — Ambulatory Visit (INDEPENDENT_AMBULATORY_CARE_PROVIDER_SITE_OTHER): Payer: Medicare Other | Admitting: Family Medicine

## 2014-03-26 ENCOUNTER — Encounter: Payer: Self-pay | Admitting: Family Medicine

## 2014-03-26 VITALS — BP 158/94 | HR 99 | Temp 98.1°F | Ht 72.5 in | Wt 188.2 lb

## 2014-03-26 DIAGNOSIS — N186 End stage renal disease: Secondary | ICD-10-CM

## 2014-03-26 DIAGNOSIS — E7289 Other specified disorders of amino-acid metabolism: Secondary | ICD-10-CM

## 2014-03-26 DIAGNOSIS — B86 Scabies: Secondary | ICD-10-CM

## 2014-03-26 DIAGNOSIS — E1029 Type 1 diabetes mellitus with other diabetic kidney complication: Secondary | ICD-10-CM

## 2014-03-26 DIAGNOSIS — L0291 Cutaneous abscess, unspecified: Secondary | ICD-10-CM

## 2014-03-26 DIAGNOSIS — L0390 Cellulitis, unspecified: Secondary | ICD-10-CM

## 2014-03-26 DIAGNOSIS — E1022 Type 1 diabetes mellitus with diabetic chronic kidney disease: Secondary | ICD-10-CM

## 2014-03-26 DIAGNOSIS — E7251 Non-ketotic hyperglycinemia: Secondary | ICD-10-CM

## 2014-03-26 DIAGNOSIS — R0602 Shortness of breath: Secondary | ICD-10-CM

## 2014-03-26 MED ORDER — CLINDAMYCIN HCL 300 MG PO CAPS: 300.0000 mg | ORAL_CAPSULE | Freq: Three times a day (TID) | ORAL | Status: AC

## 2014-03-26 MED ORDER — PERMETHRIN 5 % EX CREA: 1.0000 "application " | TOPICAL_CREAM | Freq: Once | CUTANEOUS | Status: DC

## 2014-03-26 MED ORDER — CLINDAMYCIN HCL 300 MG PO CAPS: 300.0000 mg | ORAL_CAPSULE | Freq: Three times a day (TID) | ORAL | Status: DC

## 2014-03-26 MED ORDER — PERMETHRIN 5 % EX CREA
1.0000 | TOPICAL_CREAM | Freq: Once | CUTANEOUS | Status: DC
Start: 2014-03-26 — End: 2014-07-11

## 2014-03-26 MED ORDER — PERMETHRIN 1 % EX LOTN: 1.0000 "application " | TOPICAL_LOTION | Freq: Once | CUTANEOUS | Status: DC

## 2014-03-26 NOTE — Assessment & Plan Note (Signed)
Improving but still erythematous.  Will treat with another 7 days of clindamycin.

## 2014-03-26 NOTE — Progress Notes (Signed)
Patient ID: Taylor [name], male    DOB: 1971/03/06, 43 y.o.   MRN: [id number]  HPI Comments: Taylor [name] is a pleasant Gentleman with history of coronary artery disease, bypass surgery in December 2010 at [facility], poorly controlled diabetes, hypertension, end-stage renal disease on dialysis on Tuesday, Thursday and Saturday here for hospital follow up.  Notes reviewed.  Admitted 03/17/14- 03/20/14 to [facility] for hyperglycemia.  1.  Nonketotic hyperglycemia state- Etiology was unclear.  Was admitted to ICU and started on insulin drip for 24 hours and then weaned off and placed back on lantus and novolog (home regimen).   Last a1c was 13.  Has not kept appointments with Dr. Elvera Lennox.    2.  ESRD- on HD.  Did receive HD in hospital due to volume overload from aggressive IVF hydration.  3.  Right fifth digit ulcer/cellulitis- clindaymycin, discharged on oral.   4.  Itchy rash since he stayed at a friend's house. Wt Readings from Last 3 Encounters:  03/26/14 188 lb 4 oz (85.39 kg)  07/06/13 180 lb (81.647 kg)  01/03/13 210 lb 8 oz (95.482 kg)       Patient Active Problem List   Diagnosis Date Noted  . Nonketotic hyperglycinemia 03/26/2014  . PNA (pneumonia) 04/18/2012  . Left carotid bruit 02/08/2012  . Sebaceous cyst, neck 02/02/2012  . Diabetic hyperosmolar non-ketotic state 01/04/2012  . Type 1 diabetes mellitus with diabetic foot ulcer 01/04/2012  . Diabetic peripheral neuropathy 01/04/2012  . Hyponatremia 01/04/2012  . Abscess and cellulitis 11/23/2011  . Scrotal abscess 05/21/2011  . DKA (diabetic ketoacidoses) 05/21/2011  . Panic attacks 05/21/2011  . S/P CABG (coronary artery bypass graft) 02/11/2011  . HTN (hypertension) 02/11/2011  . HYPOTHYROIDISM 01/26/2011  . DIABETES MELLITUS, TYPE I 01/26/2011  . HYPERLIPIDEMIA 01/26/2011  . MYOCARDIAL INFARCTION, HX OF 01/26/2011  . End stage renal disease 01/26/2011   Past Medical History  Diagnosis Date  . End stage  renal disease on dialysis     LUE fistula  . Diabetes mellitus     TYPE I  . Diabetic neuropathy     sever, s/p multiple toe amputation  . Thyroid disease     hypothyroidism  . Myocardial infarction   . Hypertension   . Hyperlipidemia   . MI (myocardial infarction)   . ESRD on hemodialysis   . Heart murmur   . Hypothyroidism   . H/O hiatal hernia   . Neuromuscular disorder     Diabetic neuropathy  . GERD (gastroesophageal reflux disease)   . Anxiety   . Sebaceous cyst     side of neck  . Shortness of breath     WITH EXERTION  OR FLUID RETENTION  . Pneumonia     2010  . Anemia   . Blood transfusion     2011  . PAD (peripheral artery disease)     pt states he is scheduled for balloon angioplasty of 2 blockages in his rt leg on Wed 02/1012 at  [facility].  . Coronary artery disease 10/2009    CABG; Hx. of MI. Cardiac cath 11/2010: severe 3 vessel CAD, patent LIMA to LAD, SVG to OM1, SVG to LPDA and SVG to RCA.  PT'S CARDIOLOGIST IS DR. Kirke Corin WITH [facility] IN [place]  . Cataract     right   Past Surgical History  Procedure Laterality Date  . Dialysis fistula creation      left upper arm fistula  . Amputation  TOES ON BOTH FEET  . Abscess drainage  Behind right ear/occipital scalp  . Skin graft    . Eye surgery  1999  . Coronary artery bypass graft  10/2009    [facility] (Dr. Katrinka Blazing)  . Foot amputation    . Cardiac catheterization    . Cardiac catheterization  2/14    [facility]: severe 3 vessel CAD with patent grafts, RHC: moderately elevated PCW and pulmonary hypertension   History  Substance Use Topics  . Smoking status: Never Smoker   . Smokeless tobacco: Former Neurosurgeon  . Alcohol Use: 0.0 oz/week     Comment: occasional beer   Family History  Problem Relation Age of Onset  . Heart disease Other   . Hypertension Other   . Hypertension Mother   . Heart disease Mother   . Diabetes Mother   . Birth defects Paternal Uncle     unaware   . Birth defects Paternal Grandmother     breast   Allergies  Allergen Reactions  . Amoxicillin-Pot Clavulanate Nausea And Vomiting  . Hydromorphone Hcl     "body started down"  . Rifampin Nausea And Vomiting   Current Outpatient Prescriptions on File Prior to Visit  Medication Sig Dispense Refill  . amitriptyline (ELAVIL) 25 MG tablet TAKE 1 BY MOUTH AT BEDTIME  30 tablet  0  . aspirin 325 MG tablet Take 325 mg by mouth daily with breakfast.       . carvedilol (COREG) 25 MG tablet Take 1 tablet (25 mg total) by mouth 2 (two) times daily with a meal.  180 tablet  1  . cinacalcet (SENSIPAR) 60 MG tablet Take 60 mg by mouth daily at 12 noon. With biggest meal      . furosemide (LASIX) 80 MG tablet Take 1 tablet (80 mg total) by mouth 2 (two) times daily.  60 tablet  6  . Insulin Syringe-Needle U-100 31G X 5/16" 0.5 ML MISC Use 5 times daily as directed * Needs appointment with Dr for any additional refills*  450 each  0  . isosorbide mononitrate (IMDUR) 30 MG 24 hr tablet Take 1 tablet (30 mg total) by mouth 2 (two) times daily.  60 tablet  6  . levothyroxine (SYNTHROID, LEVOTHROID) 75 MCG tablet Take 1 tablet (75 mcg total) by mouth daily before breakfast.  90 tablet  1  . Omega-3 Fatty Acids (FISH OIL) 1000 MG CAPS Take 1,000 mg by mouth daily.       [name] omeprazole (PRILOSEC) 40 MG capsule Take one by mouth daily  90 capsule  3  . pregabalin (LYRICA) 150 MG capsule Take one capsule by mouth every morning and two capsules every evening.  90 capsule  0  . rosuvastatin (CRESTOR) 20 MG tablet Take 1 tablet (20 mg total) by mouth at bedtime.  30 tablet  5   No current facility-administered medications on file prior to visit.   The PMH, PSH, Social History, Family History, Medications, and allergies have been reviewed in [facility], and have been updated if relevant.   ROS: See HPI +DOE- has appt with cardiology coming up this week  Patient Active Problem List   Diagnosis Date Noted  .  Nonketotic hyperglycinemia 03/26/2014  . PNA (pneumonia) 04/18/2012  . Left carotid bruit 02/08/2012  . Sebaceous cyst, neck 02/02/2012  . Diabetic hyperosmolar non-ketotic state 01/04/2012  . Type 1 diabetes mellitus with diabetic foot ulcer 01/04/2012  . Diabetic peripheral neuropathy 01/04/2012  . Hyponatremia 01/04/2012  .  Abscess and cellulitis 11/23/2011  . Scrotal abscess 05/21/2011  . DKA (diabetic ketoacidoses) 05/21/2011  . Panic attacks 05/21/2011  . S/P CABG (coronary artery bypass graft) 02/11/2011  . HTN (hypertension) 02/11/2011  . HYPOTHYROIDISM 01/26/2011  . DIABETES MELLITUS, TYPE I 01/26/2011  . HYPERLIPIDEMIA 01/26/2011  . MYOCARDIAL INFARCTION, HX OF 01/26/2011  . End stage renal disease 01/26/2011   Past Medical History  Diagnosis Date  . End stage renal disease on dialysis     LUE fistula  . Diabetes mellitus     TYPE I  . Diabetic neuropathy     sever, s/p multiple toe amputation  . Thyroid disease     hypothyroidism  . Myocardial infarction   . Hypertension   . Hyperlipidemia   . MI (myocardial infarction)   . ESRD on hemodialysis   . Heart murmur   . Hypothyroidism   . H/O hiatal hernia   . Neuromuscular disorder     Diabetic neuropathy  . GERD (gastroesophageal reflux disease)   . Anxiety   . Sebaceous cyst     side of neck  . Shortness of breath     WITH EXERTION  OR FLUID RETENTION  . Pneumonia     2010  . Anemia   . Blood transfusion     2011  . PAD (peripheral artery disease)     pt states he is scheduled for balloon angioplasty of 2 blockages in his rt leg on Wed 02/1012 at  [facility].  . Coronary artery disease 10/2009    CABG; Hx. of MI. Cardiac cath 11/2010: severe 3 vessel CAD, patent LIMA to LAD, SVG to OM1, SVG to LPDA and SVG to RCA.  PT'S CARDIOLOGIST IS DR. Kirke Corin WITH [facility] IN [place]  . Cataract     right   Past Surgical History  Procedure Laterality Date  . Dialysis fistula  creation      left upper arm fistula  . Amputation      TOES ON BOTH FEET  . Abscess drainage  Behind right ear/occipital scalp  . Skin graft    . Eye surgery  1999  . Coronary artery bypass graft  10/2009    [facility] (Dr. Katrinka Blazing)  . Foot amputation    . Cardiac catheterization    . Cardiac catheterization  2/14    [facility]: severe 3 vessel CAD with patent grafts, RHC: moderately elevated PCW and pulmonary hypertension   History  Substance Use Topics  . Smoking status: Never Smoker   . Smokeless tobacco: Former Neurosurgeon  . Alcohol Use: 0.0 oz/week     Comment: occasional beer   Family History  Problem Relation Age of Onset  . Heart disease Other   . Hypertension Other   . Hypertension Mother   . Heart disease Mother   . Diabetes Mother   . Birth defects Paternal Uncle     unaware  . Birth defects Paternal Grandmother     breast   Allergies  Allergen Reactions  . Amoxicillin-Pot Clavulanate Nausea And Vomiting  . Hydromorphone Hcl     "body started down"  . Rifampin Nausea And Vomiting   Current Outpatient Prescriptions on File Prior to Visit  Medication Sig Dispense Refill  . amitriptyline (ELAVIL) 25 MG tablet TAKE 1 BY MOUTH AT BEDTIME  30 tablet  0  . aspirin 325 MG tablet Take 325 mg by mouth daily with breakfast.       . carvedilol (COREG) 25 MG  tablet Take 1 tablet (25 mg total) by mouth 2 (two) times daily with a meal.  180 tablet  1  . cinacalcet (SENSIPAR) 60 MG tablet Take 60 mg by mouth daily at 12 noon. With biggest meal      . furosemide (LASIX) 80 MG tablet Take 1 tablet (80 mg total) by mouth 2 (two) times daily.  60 tablet  6  . Insulin Syringe-Needle U-100 31G X 5/16" 0.5 ML MISC Use 5 times daily as directed * Needs appointment with Dr for any additional refills*  450 each  0  . isosorbide mononitrate (IMDUR) 30 MG 24 hr tablet Take 1 tablet (30 mg total) by mouth 2 (two) times daily.  60 tablet  6  . levothyroxine (SYNTHROID, LEVOTHROID) 75 MCG tablet Take 1  tablet (75 mcg total) by mouth daily before breakfast.  90 tablet  1  . Omega-3 Fatty Acids (FISH OIL) 1000 MG CAPS Take 1,000 mg by mouth daily.       [name]. omeprazole (PRILOSEC) 40 MG capsule Take one by mouth daily  90 capsule  3  . pregabalin (LYRICA) 150 MG capsule Take one capsule by mouth every morning and two capsules every evening.  90 capsule  0  . rosuvastatin (CRESTOR) 20 MG tablet Take 1 tablet (20 mg total) by mouth at bedtime.  30 tablet  5   No current facility-administered medications on file prior to visit.   The PMH, PSH, Social History, Family History, Medications, and allergies [facility], and have been updated if relevant.   Physical Exam  BP 158/94  Pulse 99  Temp(Src) 98.1 F (36.7 C) (Oral)  Ht 6' 0.5" (1.842 m)  Wt 188 lb 4 oz (85.39 kg)  BMI 25.17 kg/m2  SpO2 98% Wt Readings from Last 3 Encounters:  03/26/14 188 lb 4 oz (85.39 kg)  07/06/13 180 lb (81.647 kg)  01/03/13 210 lb 8 oz (95.482 kg)     Constitutional: He is oriented to person, place, and time.  HENT:  Head: Normocephalic.  Nose: Nose normal.  Cardiovascular: Normal rate, regular rhythm, S1 normal, S2 normal and intact distal pulses.  Exam reveals no gallop and no friction rub.   Murmur heard.  Systolic murmur is present with a grade of 2/6  Pulmonary/Chest: Effort normal and breath sounds normal. No respiratory distress. He has no wheezes. He has no rales. He exhibits no tenderness.  Neurological: He is alert and oriented to person, place, and time. Coordination normal.     Psychiatric: He has a normal mood and affect. His behavior is normal. Judgment and thought content normal.        Skin:  Right fifth digit ulcer with some erythema Linear burrow excoriation marks on chest, back, legs

## 2014-03-26 NOTE — Assessment & Plan Note (Signed)
S/p aggressive IVFs. Will check BNP today.

## 2014-03-26 NOTE — Assessment & Plan Note (Signed)
Very brittle.  Reports being compliant with meds but has not kept appts with endocrinology.  Will refer back to endocrinology.  He agrees to see them.

## 2014-03-26 NOTE — Progress Notes (Signed)
Pre visit review using our clinic review tool, if applicable. No additional management support is needed unless otherwise documented below in the visit note. 

## 2014-03-26 NOTE — Assessment & Plan Note (Signed)
Permethrin

## 2014-03-26 NOTE — Patient Instructions (Signed)
Good to see you. I will call you with your lab results.   

## 2014-03-26 NOTE — Addendum Note (Signed)
Addended by: Dianne Dun on: 03/26/2014 03:28 PM   Modules accepted: Orders

## 2014-03-27 ENCOUNTER — Emergency Department: Payer: Self-pay | Admitting: Emergency Medicine

## 2014-03-27 ENCOUNTER — Telehealth: Payer: Self-pay | Admitting: Radiology

## 2014-03-27 ENCOUNTER — Encounter: Payer: Medicare Other | Admitting: Nurse Practitioner

## 2014-03-27 LAB — CBC
HCT: 30.9 % — ABNORMAL LOW (ref 40.0–52.0)
HGB: 9.8 g/dL — ABNORMAL LOW (ref 13.0–18.0)
MCH: 28.6 pg (ref 26.0–34.0)
MCHC: 31.7 g/dL — ABNORMAL LOW (ref 32.0–36.0)
MCV: 90 fL (ref 80–100)
Platelet: 183 10*3/uL (ref 150–440)
RBC: 3.42 10*6/uL — ABNORMAL LOW (ref 4.40–5.90)
RDW: 16.3 % — ABNORMAL HIGH (ref 11.5–14.5)
WBC: 6.2 10*3/uL (ref 3.8–10.6)

## 2014-03-27 LAB — COMPREHENSIVE METABOLIC PANEL
ALBUMIN: 2.9 g/dL — AB (ref 3.5–5.2)
ALT: 40 U/L (ref 0–53)
AST: 47 U/L — ABNORMAL HIGH (ref 0–37)
Albumin: 2.7 g/dL — ABNORMAL LOW (ref 3.4–5.0)
Alkaline Phosphatase: 311 U/L — ABNORMAL HIGH (ref 39–117)
Alkaline Phosphatase: 488 U/L — ABNORMAL HIGH
Anion Gap: 7 (ref 7–16)
BUN: 58 mg/dL — ABNORMAL HIGH (ref 6–23)
BUN: 30 mg/dL — ABNORMAL HIGH (ref 7–18)
Bilirubin,Total: 0.7 mg/dL (ref 0.2–1.0)
CHLORIDE: 96 meq/L (ref 96–112)
CO2: 22 meq/L (ref 19–32)
Calcium, Total: 8.8 mg/dL (ref 8.5–10.1)
Calcium: 8.8 mg/dL (ref 8.4–10.5)
Chloride: 97 mmol/L — ABNORMAL LOW (ref 98–107)
Co2: 32 mmol/L (ref 21–32)
Creatinine, Ser: 6 mg/dL (ref 0.4–1.5)
Creatinine: 3.49 mg/dL — ABNORMAL HIGH (ref 0.60–1.30)
EGFR (African American): 23 — ABNORMAL LOW
EGFR (Non-African Amer.): 20 — ABNORMAL LOW
GFR: 10.77 mL/min — AB (ref >60.00)
Glucose, Bld: 240 mg/dL — ABNORMAL HIGH (ref 70–99)
Glucose: 104 mg/dL — ABNORMAL HIGH (ref 65–99)
Osmolality: 278 (ref 275–301)
POTASSIUM: 5.7 meq/L — AB (ref 3.5–5.1)
Potassium: 3.3 mmol/L — ABNORMAL LOW (ref 3.5–5.1)
SGOT(AST): 66 U/L — ABNORMAL HIGH (ref 15–37)
SGPT (ALT): 63 U/L (ref 12–78)
SODIUM: 132 meq/L — AB (ref 135–145)
Sodium: 136 mmol/L (ref 136–145)
TOTAL PROTEIN: 7.3 g/dL (ref 6.0–8.3)
Total Bilirubin: 0.7 mg/dL (ref 0.2–1.2)
Total Protein: 8.1 g/dL (ref 6.4–8.2)

## 2014-03-27 LAB — URINALYSIS, COMPLETE
Bacteria: NONE SEEN
Bilirubin,UR: NEGATIVE
Glucose,UR: 50 mg/dL (ref 0–75)
Ketone: NEGATIVE
Leukocyte Esterase: NEGATIVE
Nitrite: NEGATIVE
Ph: 7 (ref 4.5–8.0)
Protein: >=500
RBC,UR: 7 /HPF (ref 0–5)
Specific Gravity: 1.014 (ref 1.003–1.030)
Squamous Epithelial: NONE SEEN
WBC UR: 1 /HPF (ref 0–5)

## 2014-03-27 LAB — BRAIN NATRIURETIC PEPTIDE: Pro B Natriuretic peptide (BNP): 1212 pg/mL — ABNORMAL HIGH (ref 0.0–100.0)

## 2014-03-27 NOTE — Telephone Encounter (Signed)
[facility] lab called critical results, CRT - 6.04, GFR - 10.77. Results given to Dr [name]

## 2014-03-27 NOTE — Telephone Encounter (Signed)
Aware.  Pt is on HD.

## 2014-03-28 ENCOUNTER — Encounter: Payer: Self-pay | Admitting: Nurse Practitioner

## 2014-03-28 ENCOUNTER — Ambulatory Visit (INDEPENDENT_AMBULATORY_CARE_PROVIDER_SITE_OTHER): Payer: Medicare Other | Admitting: Nurse Practitioner

## 2014-03-28 VITALS — BP 138/80 | HR 96 | Ht 74.0 in | Wt 183.5 lb

## 2014-03-28 DIAGNOSIS — N186 End stage renal disease: Secondary | ICD-10-CM

## 2014-03-28 DIAGNOSIS — I10 Essential (primary) hypertension: Secondary | ICD-10-CM

## 2014-03-28 DIAGNOSIS — Z992 Dependence on renal dialysis: Secondary | ICD-10-CM

## 2014-03-28 DIAGNOSIS — I2510 Atherosclerotic heart disease of native coronary artery without angina pectoris: Secondary | ICD-10-CM

## 2014-03-28 DIAGNOSIS — E109 Type 1 diabetes mellitus without complications: Secondary | ICD-10-CM

## 2014-03-28 DIAGNOSIS — R0602 Shortness of breath: Secondary | ICD-10-CM

## 2014-03-28 NOTE — Progress Notes (Signed)
Patient Name: Taylor [name] Date of Encounter: 03/28/2014  Primary Care Provider:  [name], MD Primary Cardiologist:  Judie Petit. Kirke Corin, MD   Patient Profile  43 y/o male w/ a h/o CAD and ESRD on dialysis, who presents for clinic f/u.  Problem List   Past Medical History  Diagnosis Date  . End stage renal disease on dialysis     LUE fistula  . Type I diabetes mellitus     a. 03/2014 admitted with HNK to [facility].  . Diabetic neuropathy     severe, s/p multiple toe amputation  . Hypothyroidism   . Hypertension   . Hyperlipidemia   . Heart murmur   . H/O hiatal hernia   . GERD (gastroesophageal reflux disease)   . Anxiety   . Sebaceous cyst     side of neck  . Pneumonia     2010  . Anemia     a. req PRBC's 2011.  [name] PAD (peripheral artery disease)     a. s/p amputation of toes on the right;  b. left LE claudication.  . Coronary artery disease     a. s/p MI;  b. 10/2009 CABG x 3 @ [facility]: LIMA->LAD, VG->OM3, VG->RPDA; c. 11/2010 Cath 3/3 patent grafts;  d 12/2012 Cath: LM 30d, LAD 85p, D1 70, D2 90, LCX 40ost, OM2 100, RCA 90p, 150m, L->LAD ok, VG->OM3 ok, VG->RPDA 30, EF 50%->Med Rx.  . Cataract     right  . Valvular disease     a. 11/2012 Echo: EF 55-60%, mild LVH, mild MR, mild bi-atrial enlargement, mild-mod TR, PASP .   Past Surgical History  Procedure Laterality Date  . Dialysis fistula creation      left upper arm fistula  . Amputation      TOES ON BOTH FEET  . Abscess drainage  Behind right ear/occipital scalp  . Skin graft    . Eye surgery  1999  . Coronary artery bypass graft  10/2009    [facility] (Dr. Katrinka Blazing)  . Foot amputation    . Cardiac catheterization    . Cardiac catheterization  2/14    [facility]: severe 3 vessel CAD with patent grafts, RHC: moderately elevated PCW and pulmonary hypertension    Allergies  Allergies  Allergen Reactions  . Amoxicillin-Pot Clavulanate Nausea And Vomiting  . Hydromorphone Hcl     "body started down"  . Rifampin  Nausea And Vomiting    HPI  43 y/o male with the above problem list.  He was recently admitted to [facility] with HNK.  During admission, he says that he was volume up and required more dialysis than usual to get down to his dry weight.  He was told by medical staff that his volume overload may be related to his valvular heart disease and his "loud murmur."  Following d/c he has continued to note that on his dialysis days, his weight is sl above what it might normally be.  With the extra volume, he has noted some DOE and abdominal bloating.  He has not been having chest pain.  He was seen by his PCP on the 18th and labs were drawn.  He was hyperkalemic.  Yesterday, he went to [facility] ER 2/2 hypoglycemia, and hyperkalemia had resolved.  He has no complaints today.  Home Medications  Prior to Admission medications   Medication Sig Start Date End Date Taking? Authorizing Provider  amitriptyline (ELAVIL) 25 MG tablet TAKE 1 BY MOUTH AT BEDTIME   Yes  Dianne Dun, MD  aspirin 325 MG tablet Take 325 mg by mouth daily with breakfast.    Yes Historical Provider, MD  carvedilol (COREG) 25 MG tablet Take 1 tablet (25 mg total) by mouth 2 (two) times daily with a meal. 08/07/13  Yes Dianne Dun, MD  cinacalcet (SENSIPAR) 60 MG tablet Take 60 mg by mouth daily at 12 noon. With biggest meal 02/16/11  Yes Dianne Dun, MD  clindamycin (CLEOCIN) 300 MG capsule Take 1 capsule (300 mg total) by mouth 3 (three) times daily. 03/26/14 04/02/14 Yes Dianne Dun, MD  furosemide (LASIX) 80 MG tablet Take 1 tablet (80 mg total) by mouth 2 (two) times daily. 08/01/12  Yes Dianne Dun, MD  insulin aspart (NOVOLOG) 100 UNIT/ML injection Inject 12 Units into the skin 3 (three) times daily before meals. Plus the sliding scale* Needs appointment * 03/21/13  Yes Dianne Dun, MD  insulin glargine (LANTUS) 100 UNIT/ML injection INJECT 60 UNITS SUBCUT-     ANEOUSLY TWO TIMES A DAY ORAS DIRECTED 12/06/13  Yes Dianne Dun, MD  Insulin  Syringe-Needle U-100 31G X 5/16" 0.5 ML MISC Use 5 times daily as directed * Needs appointment with Dr for any additional refills* 03/21/13  Yes Dianne Dun, MD  isosorbide mononitrate (IMDUR) 30 MG 24 hr tablet Take 1 tablet (30 mg total) by mouth 2 (two) times daily. 02/11/11  Yes Antonieta Iba, MD  levothyroxine (SYNTHROID, LEVOTHROID) 75 MCG tablet Take 1 tablet (75 mcg total) by mouth daily before breakfast. 07/26/13  Yes Dianne Dun, MD  Omega-3 Fatty Acids (FISH OIL) 1000 MG CAPS Take 1,000 mg by mouth daily.    Yes Historical Provider, MD  omeprazole (PRILOSEC) 40 MG capsule Take one by mouth daily 06/26/13  Yes Dianne Dun, MD  permethrin (ACTICIN) 5 % cream Apply 1 application topically once. 03/26/14  Yes Dianne Dun, MD  pregabalin (LYRICA) 150 MG capsule Take one capsule by mouth every morning and two capsules every evening. 03/17/13  Yes Dianne Dun, MD  rosuvastatin (CRESTOR) 20 MG tablet Take 1 tablet (20 mg total) by mouth at bedtime. 12/13/12  Yes Dianne Dun, MD    Review of Systems  DOE, chronic LEE, some wt increases in between dialysis sessions, abd bloating.  All other systems reviewed and are otherwise negative except as noted above.  Physical Exam  Blood pressure 138/80, pulse 96, height 6\' 2"  (1.88 m), weight 183 lb 8 oz (83.235 kg).  General: Pleasant, NAD Psych: Normal affect. Neuro: Alert and oriented X 3. Moves all extremities spontaneously. HEENT: Normal  Neck: Supple without JVD.  Radiated bruit from AVF into left neck. Lungs:  Resp regular and unlabored, CTA. Heart: RRR no s3, s4.  Bruit from his left upper arm AVF is audible across his base.  Softer systolic murmur @ LLSB->apex. Abdomen: Firm, non-tender, non-distended, BS + x 4.  Extremities: No clubbing, cyanosis.  1+ bilat LE edema to mid-calf. DP/PT/Radials 1+ and equal bilaterally.  Accessory Clinical Findings  ECG - RSR96, small inf q's - no acute ST/T changes.  Assessment & Plan  1.  Chronic  Dyspnea:  He has noted some increase in his degree of dyspnea recently.  This has been associated with his wts in-between dialysis sessions running higher.  He has not had c/p.  In the past, dyspnea led to a nuc study in 11/2012, which was nl and this was followed by a  cath in the setting of persistent Ss, which showed 3/3 patent grafts.  He has been medically managed since.  I will obtain a repeat echo to re-eval his LV fxn.  Suspect diastolic chf may be playing a role.  He was told that his valvular heart dzs needs to be looked after as he has a loud murmur, however this 'murmur' is actually the radiated bruit from his left upper arm AVF.  Volume mgmt per nephrology/dialysis.  2.  CAD:  No c/p.  Re-eval echo in setting of DOE.  Last cath 12/2012 w/ 3/3 patent grafts.  Cont asa, bb, nitrate, statin.  3.  ESRD:  On T, Th, Sat dialysis per nephrology.  4.  DMI:  Mgmt per primary care.  5.  Recent Hyperkalemia:  Resolved based on [facility] labs from 5/19 while he was in the ER for hypoglycemia.  6.  HTN:  Stable - generally runs low @ dialysis.  7.  HL:  On statin.  8.  Dispo:  F/U echo.  F/U Dr. Kirke Corin in 3 mos or sooner if necessary.   Ok Anis, NP 03/28/2014, 3:51 PM

## 2014-03-28 NOTE — Patient Instructions (Signed)
Your physician has requested that you have an echocardiogram. Echocardiography is a painless test that uses sound waves to create images of your heart. It provides your doctor with information about the size and shape of your heart and how well your heart's chambers and valves are working. This procedure takes approximately one hour. There are no restrictions for this procedure.   Your physician recommends that you schedule a follow-up appointment in:  Dr. Kirke Corin 3 months

## 2014-04-04 ENCOUNTER — Ambulatory Visit (INDEPENDENT_AMBULATORY_CARE_PROVIDER_SITE_OTHER): Payer: Medicare Other | Admitting: Internal Medicine

## 2014-04-04 ENCOUNTER — Encounter: Payer: Self-pay | Admitting: Internal Medicine

## 2014-04-04 VITALS — BP 128/64 | HR 97 | Temp 98.0°F | Resp 12 | Ht 74.0 in | Wt 182.0 lb

## 2014-04-04 DIAGNOSIS — N186 End stage renal disease: Secondary | ICD-10-CM

## 2014-04-04 DIAGNOSIS — E1029 Type 1 diabetes mellitus with other diabetic kidney complication: Secondary | ICD-10-CM

## 2014-04-04 DIAGNOSIS — E1022 Type 1 diabetes mellitus with diabetic chronic kidney disease: Secondary | ICD-10-CM

## 2014-04-04 DIAGNOSIS — I2510 Atherosclerotic heart disease of native coronary artery without angina pectoris: Secondary | ICD-10-CM
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
## 2014-04-04 NOTE — Patient Instructions (Signed)
-   Please decrease Lantus to 35 units at bedtime - Change Humalog as follows: 10 units with a smaller meal 12 units with a larger meal - Change the Humalog sliding scale as follows: - 150-175: + 1 unit  - 176-200: + 2 units  - 201-225: + 3 units  - 226-250: + 4 units  - >250: + 5 units  Please return in 1 month with your sugar log.   Basic Rules for Patients with Type I Diabetes Mellitus  1. The American Diabetes Association (ADA) recommended targets: - fasting sugar <130 - after meal sugar <180 - HbA1C <7%  2. Engage in ?150 min moderate exercise per week  3. Make sure you have ?8h of sleep every night as this helps both blood sugars and your weight.  4. Always keep a sugar log (not only record in your meter) and bring it to all appointments with [name].  5. "15-15 rule" for hypoglycemia: if sugars are low, take 15 g of carbs** ("fast sugar" - e.g. 4 glucose tablets, 4 oz orange juice), wait 15 min, then check sugars again. If still <80, repeat. Continue  until your sugars >80, then eat a normal meal.   6. Teach family members and coworkers to inject glucagon. Have a glucagon set at home and one at work. They should call 911 after using the set.  7. Check sugar before driving. If <100, correct, and only start driving if sugars rise ?277. Check sugar every hour when on a long drive.  8. Make sure you have a MedAlert bracelet or pendant mentioning "Type I Diabetes Mellitus". If you have a prior episode of severe hypoglycemia or hypoglycemia unawareness, it should also mention this.  9. Please do not walk barefoot. Inspect your feet for sores/cuts and let us know if you have them.  10. Please call [facility] with any questions and concerns [phone]).   **E.g. of "fast carbs":   first choice (15 g):  1 tube glucose gel, GlucoPouch 15, 2 oz glucose liquid   second choice (15-16 g):  3 or 4 glucose tablets (best taken  with water), 15 Dextrose Bits chewable    third choice (15-20 g):   cup fruit juice,  cup regular soda, 1 cup skim milk,  1 cup sports drink   fourth choice (15-20 g):  1 small tube [place] gel (not frosting), 2 tbsp raisins, 1 tbsp table sugar,  candy, jelly beans, gum drops - check package for carb amount   (adapted from: Juluis Rainier. "Insulin therapy and hypoglycemia" Endocrinol Metab Clin N Am 2012, 41: 57-87)

## 2014-04-06 ENCOUNTER — Other Ambulatory Visit: Payer: Self-pay

## 2014-04-06 ENCOUNTER — Other Ambulatory Visit (INDEPENDENT_AMBULATORY_CARE_PROVIDER_SITE_OTHER): Payer: Medicare Other

## 2014-04-06 DIAGNOSIS — R0602 Shortness of breath: Secondary | ICD-10-CM

## 2014-04-06 DIAGNOSIS — I2510 Atherosclerotic heart disease of native coronary artery without angina pectoris: Secondary | ICD-10-CM

## 2014-04-06 DIAGNOSIS — I369 Nonrheumatic tricuspid valve disorder, unspecified: Secondary | ICD-10-CM

## 2014-04-09 ENCOUNTER — Encounter: Payer: Self-pay | Admitting: Family Medicine

## 2014-04-09 ENCOUNTER — Telehealth: Payer: Self-pay | Admitting: Family Medicine

## 2014-04-09 ENCOUNTER — Ambulatory Visit (INDEPENDENT_AMBULATORY_CARE_PROVIDER_SITE_OTHER): Payer: Medicare Other | Admitting: Family Medicine

## 2014-04-09 VITALS — BP 148/82 | HR 91 | Temp 97.6°F | Ht 72.25 in | Wt 181.2 lb

## 2014-04-09 DIAGNOSIS — F329 Major depressive disorder, single episode, unspecified: Secondary | ICD-10-CM

## 2014-04-09 DIAGNOSIS — F32A Depression, unspecified: Secondary | ICD-10-CM

## 2014-04-09 DIAGNOSIS — I10 Essential (primary) hypertension: Secondary | ICD-10-CM

## 2014-04-09 DIAGNOSIS — Z7189 Other specified counseling: Secondary | ICD-10-CM

## 2014-04-09 DIAGNOSIS — I2510 Atherosclerotic heart disease of native coronary artery without angina pectoris: Secondary | ICD-10-CM

## 2014-04-09 DIAGNOSIS — F3289 Other specified depressive episodes: Secondary | ICD-10-CM

## 2014-04-09 DIAGNOSIS — Z0000 Encounter for general adult medical examination without abnormal findings: Secondary | ICD-10-CM

## 2014-04-09 DIAGNOSIS — E109 Type 1 diabetes mellitus without complications: Secondary | ICD-10-CM

## 2014-04-09 DIAGNOSIS — N186 End stage renal disease: Secondary | ICD-10-CM

## 2014-04-09 DIAGNOSIS — Z23 Encounter for immunization: Secondary | ICD-10-CM

## 2014-04-09 DIAGNOSIS — E11319 Type 2 diabetes mellitus with unspecified diabetic retinopathy without macular edema: Secondary | ICD-10-CM

## 2014-04-09 DIAGNOSIS — E1022 Type 1 diabetes mellitus with diabetic chronic kidney disease: Secondary | ICD-10-CM

## 2014-04-09 DIAGNOSIS — E785 Hyperlipidemia, unspecified: Secondary | ICD-10-CM

## 2014-04-09 DIAGNOSIS — E1142 Type 2 diabetes mellitus with diabetic polyneuropathy: Secondary | ICD-10-CM

## 2014-04-09 LAB — COMPREHENSIVE METABOLIC PANEL
ALBUMIN: 3 g/dL — AB (ref 3.5–5.2)
ALT: 23 U/L (ref 0–53)
AST: 25 U/L (ref 0–37)
Alkaline Phosphatase: 262 U/L — ABNORMAL HIGH (ref 39–117)
BUN: 65 mg/dL — ABNORMAL HIGH (ref 6–23)
CALCIUM: 9.5 mg/dL (ref 8.4–10.5)
CHLORIDE: 96 meq/L (ref 96–112)
CO2: 24 meq/L (ref 19–32)
CREATININE: 5.7 mg/dL — AB (ref 0.4–1.5)
GFR: 11.54 mL/min — AB (ref >60.00)
Glucose, Bld: 159 mg/dL — ABNORMAL HIGH (ref 70–99)
POTASSIUM: 4.7 meq/L (ref 3.5–5.1)
SODIUM: 134 meq/L — AB (ref 135–145)
TOTAL PROTEIN: 7.4 g/dL (ref 6.0–8.3)
Total Bilirubin: 0.7 mg/dL (ref 0.2–1.2)

## 2014-04-09 LAB — LIPID PANEL
CHOL/HDL RATIO: 2
Cholesterol: 93 mg/dL (ref 0–200)
HDL: 40 mg/dL (ref >39.00)
LDL Cholesterol: 36 mg/dL (ref 0–99)
TRIGLYCERIDES: 86 mg/dL (ref 0.0–149.0)
VLDL: 17.2 mg/dL (ref 0.0–40.0)

## 2014-04-09 MED ORDER — CITALOPRAM HYDROBROMIDE 20 MG PO TABS: 20.0000 mg | ORAL_TABLET | Freq: Every day | ORAL | Status: DC

## 2014-04-09 NOTE — Progress Notes (Signed)
Pre visit review using our clinic review tool, if applicable. No additional management support is needed unless otherwise documented below in the visit note. 

## 2014-04-09 NOTE — Addendum Note (Signed)
Addended by: Desmond Dike on: 04/09/2014 09:06 AM   Modules accepted: Orders

## 2014-04-09 NOTE — Assessment & Plan Note (Signed)
Per pt, checked at HD but recent labs faxed from dialysis does not include lipid panel.  Will check this here today. Continue Crestor.

## 2014-04-09 NOTE — Assessment & Plan Note (Addendum)
The patients weight, height, BMI and visual acuity have been recorded in the chart I have made referrals, counseling and provided education to the patient based review of the above and I have provided the pt with a written personalized care plan for preventive services.  Prevnar 13

## 2014-04-09 NOTE — Progress Notes (Signed)
Patient ID: Taylor [name], male    DOB: 10/22/1971, 43 y.o.   MRN: [id number]  HPI Comments: Taylor [name] is a pleasant Gentleman with history of coronary artery disease, bypass surgery in December 2010 at [facility], poorly controlled diabetes, hypertension, end-stage renal disease on dialysis on Tuesday, Thursday and Saturday here for medicare wellness visit.  I have personally reviewed the Medicare Annual Wellness questionnaire and have noted 1. The patient's medical and social history 2. Their use of alcohol, tobacco or illicit drugs 3. Their current medications and supplements 4. The patient's functional ability including ADL's, fall risks, home safety risks and hearing or visual             impairment. 5. Diet and physical activities 6. Evidence for depression or mood disorders  End of life wishes discussed and updated in Social History.  Pneumovax last year.  DM- finally went Dr. Elvera Lennox on 04/04/14- note reviewed. She decreased his lantus (do to h/o severe hypoglycemia) and changed his humalog and SSI.  Recommended follow up in 1 month. FSBS this am was 221 (fasting). Last eye exam- 09/29/13- Dr. Luciana Axe. On Crestor 20 mg daily.  Diabetic neuropathy- on Lyrica and Elavil- still having pain but improved with those rx.  S/p multiple toe amputations.  Lab Results  Component Value Date   HGBA1C 14.0* 07/06/2013   Hypothyroidism- on synthroid 75 mcg daily. Lab Results  Component Value Date   TSH 1.91 07/06/2013    Lab Results  Component Value Date   CHOL 134 01/26/2011   HDL 53.30 01/26/2011   LDLCALC 64 01/26/2011   TRIG 84.0 01/26/2011   CHOLHDL 3 01/26/2011      ESRD- on HD.  Did shows signs of volume overload last month, elevated BNP.  Saw cardiology- advised nephro to manage fluid status.  When is down 2 pounds since last OV.  Recent hospitalization for finger cellulitis- improved s/p abx.  ED- seeing Dr. Marcello Fennel, sees him again in July.  Injections did not work.   Wants to try a pump.  Does admit to feeling down- "tired of fighting" but does not have any active SI.  Cannot find someone who wants to date him due to his medical issues.  Does get anxious at times.  Wt Readings from Last 3 Encounters:  04/09/14 181 lb 4 oz (82.214 kg)  04/04/14 182 lb (82.555 kg)  03/28/14 183 lb 8 oz (83.235 kg)       Patient Active Problem List   Diagnosis Date Noted  . Medicare annual wellness visit, initial 04/09/2014  . Nonketotic hyperglycinemia 03/26/2014  . Type 1 DM with end-stage renal disease 03/26/2014  . SOB (shortness of breath) 03/26/2014  . Scabies 03/26/2014  . PNA (pneumonia) 04/18/2012  . Left carotid bruit 02/08/2012  . Sebaceous cyst, neck 02/02/2012  . Diabetic hyperosmolar non-ketotic state 01/04/2012  . Type 1 diabetes mellitus with diabetic foot ulcer 01/04/2012  . Diabetic peripheral neuropathy 01/04/2012  . Hyponatremia 01/04/2012  . Abscess and cellulitis 11/23/2011  . Scrotal abscess 05/21/2011  . DKA (diabetic ketoacidoses) 05/21/2011  . Panic attacks 05/21/2011  . S/P CABG (coronary artery bypass graft) 02/11/2011  . HTN (hypertension) 02/11/2011  . HYPOTHYROIDISM 01/26/2011  . DIABETES MELLITUS, TYPE I 01/26/2011  . HYPERLIPIDEMIA 01/26/2011  . MYOCARDIAL INFARCTION, HX OF 01/26/2011  . End stage renal disease 01/26/2011   Past Medical History  Diagnosis Date  . End stage renal disease on dialysis     LUE fistula  .  Type I diabetes mellitus     a. 03/2014 admitted with HNK to [facility].  . Diabetic neuropathy     severe, s/p multiple toe amputation  . Hypothyroidism   . Hypertension   . Hyperlipidemia   . Heart murmur   . H/O hiatal hernia   . GERD (gastroesophageal reflux disease)   . Anxiety   . Sebaceous cyst     side of neck  . Pneumonia     2010  . Anemia     a. req PRBC's 2011.  [name] PAD (peripheral artery disease)     a. s/p amputation of toes on the right;  b. left LE claudication.  . Coronary artery  disease     a. s/p MI;  b. 10/2009 CABG x 3 @ [facility]: LIMA->LAD, VG->OM3, VG->RPDA; c. 11/2010 Cath 3/3 patent grafts;  d 12/2012 Cath: LM 30d, LAD 85p, D1 70, D2 90, LCX 40ost, OM2 100, RCA 90p, 125m, L->LAD ok, VG->OM3 ok, VG->RPDA 30, EF 50%->Med Rx.  . Cataract     right  . Valvular disease     a. 11/2012 Echo: EF 55-60%, mild LVH, mild MR, mild bi-atrial enlargement, mild-mod TR, PASP .   Past Surgical History  Procedure Laterality Date  . Dialysis fistula creation      left upper arm fistula  . Amputation      TOES ON BOTH FEET  . Abscess drainage  Behind right ear/occipital scalp  . Skin graft    . Eye surgery  1999  . Coronary artery bypass graft  10/2009    [facility] (Dr. Katrinka Blazing)  . Foot amputation    . Cardiac catheterization    . Cardiac catheterization  2/14    [facility]: severe 3 vessel CAD with patent grafts, RHC: moderately elevated PCW and pulmonary hypertension   History  Substance Use Topics  . Smoking status: Never Smoker   . Smokeless tobacco: Former Neurosurgeon  . Alcohol Use: 0.0 oz/week     Comment: occasional beer   Family History  Problem Relation Age of Onset  . Heart disease Other   . Hypertension Other   . Hypertension Mother   . Heart disease Mother   . Diabetes Mother   . Birth defects Paternal Uncle     unaware  . Birth defects Paternal Grandmother     breast   Allergies  Allergen Reactions  . Amoxicillin-Pot Clavulanate Nausea And Vomiting  . Hydromorphone Hcl     "body started down"  . Rifampin Nausea And Vomiting   Current Outpatient Prescriptions on File Prior to Visit  Medication Sig Dispense Refill  . amitriptyline (ELAVIL) 25 MG tablet TAKE 1 BY MOUTH AT BEDTIME  30 tablet  0  . aspirin 325 MG tablet Take 325 mg by mouth daily with breakfast.       . carvedilol (COREG) 25 MG tablet Take 1 tablet (25 mg total) by mouth 2 (two) times daily with a meal.  180 tablet  1  . cinacalcet (SENSIPAR) 60 MG tablet Take 60 mg by mouth daily at 12 noon.  With biggest meal      . furosemide (LASIX) 80 MG tablet Take 1 tablet (80 mg total) by mouth 2 (two) times daily.  60 tablet  6  . insulin aspart (NOVOLOG) 100 UNIT/ML injection Inject 12 Units into the skin 3 (three) times daily before meals. Plus the sliding scale* Needs appointment *      . insulin glargine (LANTUS) 100 UNIT/ML injection INJECT  60 UNITS SUBCUT-     ANEOUSLY TWO TIMES A DAY ORAS DIRECTED      . Insulin Syringe-Needle U-100 31G X 5/16" 0.5 ML MISC Use 5 times daily as directed * Needs appointment with Dr for any additional refills*  450 each  0  . isosorbide mononitrate (IMDUR) 30 MG 24 hr tablet Take 1 tablet (30 mg total) by mouth 2 (two) times daily.  60 tablet  6  . levothyroxine (SYNTHROID, LEVOTHROID) 75 MCG tablet Take 1 tablet (75 mcg total) by mouth daily before breakfast.  90 tablet  1  . Omega-3 Fatty Acids (FISH OIL) 1000 MG CAPS Take 1,000 mg by mouth daily.       [name] omeprazole (PRILOSEC) 40 MG capsule Take one by mouth daily  90 capsule  3  . permethrin (ACTICIN) 5 % cream Apply 1 application topically once.  60 g  0  . pregabalin (LYRICA) 150 MG capsule Take one capsule by mouth every morning and two capsules every evening.  90 capsule  0  . rosuvastatin (CRESTOR) 20 MG tablet Take 1 tablet (20 mg total) by mouth at bedtime.  30 tablet  5   No current facility-administered medications on file prior to visit.   The PMH, PSH, Social History, Family History, Medications, and allergies have been reviewed in [facility], and have been updated if relevant.     Patient Active Problem List   Diagnosis Date Noted  . Medicare annual wellness visit, initial 04/09/2014  . Nonketotic hyperglycinemia 03/26/2014  . Type 1 DM with end-stage renal disease 03/26/2014  . SOB (shortness of breath) 03/26/2014  . Scabies 03/26/2014  . PNA (pneumonia) 04/18/2012  . Left carotid bruit 02/08/2012  . Sebaceous cyst, neck 02/02/2012  . Diabetic hyperosmolar non-ketotic state 01/04/2012   . Type 1 diabetes mellitus with diabetic foot ulcer 01/04/2012  . Diabetic peripheral neuropathy 01/04/2012  . Hyponatremia 01/04/2012  . Abscess and cellulitis 11/23/2011  . Scrotal abscess 05/21/2011  . DKA (diabetic ketoacidoses) 05/21/2011  . Panic attacks 05/21/2011  . S/P CABG (coronary artery bypass graft) 02/11/2011  . HTN (hypertension) 02/11/2011  . HYPOTHYROIDISM 01/26/2011  . DIABETES MELLITUS, TYPE I 01/26/2011  . HYPERLIPIDEMIA 01/26/2011  . MYOCARDIAL INFARCTION, HX OF 01/26/2011  . End stage renal disease 01/26/2011   Past Medical History  Diagnosis Date  . End stage renal disease on dialysis     LUE fistula  . Type I diabetes mellitus     a. 03/2014 admitted with HNK to [facility].  . Diabetic neuropathy     severe, s/p multiple toe amputation  . Hypothyroidism   . Hypertension   . Hyperlipidemia   . Heart murmur   . H/O hiatal hernia   . GERD (gastroesophageal reflux disease)   . Anxiety   . Sebaceous cyst     side of neck  . Pneumonia     2010  . Anemia     a. req PRBC's 2011.  [name] PAD (peripheral artery disease)     a. s/p amputation of toes on the right;  b. left LE claudication.  . Coronary artery disease     a. s/p MI;  b. 10/2009 CABG x 3 @ [facility]: LIMA->LAD, VG->OM3, VG->RPDA; c. 11/2010 Cath 3/3 patent grafts;  d 12/2012 Cath: LM 30d, LAD 85p, D1 70, D2 90, LCX 40ost, OM2 100, RCA 90p, 169m, L->LAD ok, VG->OM3 ok, VG->RPDA 30, EF 50%->Med Rx.  . Cataract     right  . Valvular  disease     a. 11/2012 Echo: EF 55-60%, mild LVH, mild MR, mild bi-atrial enlargement, mild-mod TR, PASP .   Past Surgical History  Procedure Laterality Date  . Dialysis fistula creation      left upper arm fistula  . Amputation      TOES ON BOTH FEET  . Abscess drainage  Behind right ear/occipital scalp  . Skin graft    . Eye surgery  1999  . Coronary artery bypass graft  10/2009    [facility] (Dr. Katrinka Blazing)  . Foot amputation    . Cardiac catheterization    . Cardiac  catheterization  2/14    [facility]: severe 3 vessel CAD with patent grafts, RHC: moderately elevated PCW and pulmonary hypertension   History  Substance Use Topics  . Smoking status: Never Smoker   . Smokeless tobacco: Former Neurosurgeon  . Alcohol Use: 0.0 oz/week     Comment: occasional beer   Family History  Problem Relation Age of Onset  . Heart disease Other   . Hypertension Other   . Hypertension Mother   . Heart disease Mother   . Diabetes Mother   . Birth defects Paternal Uncle     unaware  . Birth defects Paternal Grandmother     breast   Allergies  Allergen Reactions  . Amoxicillin-Pot Clavulanate Nausea And Vomiting  . Hydromorphone Hcl     "body started down"  . Rifampin Nausea And Vomiting   Current Outpatient Prescriptions on File Prior to Visit  Medication Sig Dispense Refill  . amitriptyline (ELAVIL) 25 MG tablet TAKE 1 BY MOUTH AT BEDTIME  30 tablet  0  . aspirin 325 MG tablet Take 325 mg by mouth daily with breakfast.       . carvedilol (COREG) 25 MG tablet Take 1 tablet (25 mg total) by mouth 2 (two) times daily with a meal.  180 tablet  1  . cinacalcet (SENSIPAR) 60 MG tablet Take 60 mg by mouth daily at 12 noon. With biggest meal      . furosemide (LASIX) 80 MG tablet Take 1 tablet (80 mg total) by mouth 2 (two) times daily.  60 tablet  6  . insulin aspart (NOVOLOG) 100 UNIT/ML injection Inject 12 Units into the skin 3 (three) times daily before meals. Plus the sliding scale* Needs appointment *      . insulin glargine (LANTUS) 100 UNIT/ML injection INJECT 60 UNITS SUBCUT-     ANEOUSLY TWO TIMES A DAY ORAS DIRECTED      . Insulin Syringe-Needle U-100 31G X 5/16" 0.5 ML MISC Use 5 times daily as directed * Needs appointment with Dr for any additional refills*  450 each  0  . isosorbide mononitrate (IMDUR) 30 MG 24 hr tablet Take 1 tablet (30 mg total) by mouth 2 (two) times daily.  60 tablet  6  . levothyroxine (SYNTHROID, LEVOTHROID) 75 MCG tablet Take 1 tablet (75  mcg total) by mouth daily before breakfast.  90 tablet  1  . Omega-3 Fatty Acids (FISH OIL) 1000 MG CAPS Take 1,000 mg by mouth daily.       [name] omeprazole (PRILOSEC) 40 MG capsule Take one by mouth daily  90 capsule  3  . permethrin (ACTICIN) 5 % cream Apply 1 application topically once.  60 g  0  . pregabalin (LYRICA) 150 MG capsule Take one capsule by mouth every morning and two capsules every evening.  90 capsule  0  . rosuvastatin (CRESTOR)  20 MG tablet Take 1 tablet (20 mg total) by mouth at bedtime.  30 tablet  5   No current facility-administered medications on file prior to visit.   The PMH, PSH, Social History, Family History, Medications, and allergies have been reviewed in [facility], and have been updated if relevant.  ROS: See HPI Denies changes in bowel habits No blood in his stool Denies any SOB No CP   Physical Exam  BP 148/82  Pulse 91  Temp(Src) 97.6 F (36.4 C) (Oral)  Ht 6' 0.25" (1.835 m)  Wt 181 lb 4 oz (82.214 kg)  BMI 24.42 kg/m2  SpO2 99% Wt Readings from Last 3 Encounters:  04/09/14 181 lb 4 oz (82.214 kg)  04/04/14 182 lb (82.555 kg)  03/28/14 183 lb 8 oz (83.235 kg)     Constitutional: He is oriented to person, place, and time.  HENT:  Head: Normocephalic.  Nose: Nose normal.  Cardiovascular: Normal rate, regular rhythm, S1 normal, S2 normal and intact distal pulses.  Exam reveals no gallop and no friction rub.   No murmur-sounds from fistula audible on cardiac exam per pt  Pulmonary/Chest: Effort normal and breath sounds normal. No respiratory distress. He has no wheezes. He has no rales. He exhibits no tenderness.  Neurological: He is alert and oriented to person, place, and time. Coordination normal.     Psychiatric: He has a normal mood and affect. His behavior is normal. Judgment and thought content normal.        Skin:  Right fifth digit ulcer smaller without erythema

## 2014-04-09 NOTE — Assessment & Plan Note (Signed)
Followed by Dr. Elvera Lennox. On HD.

## 2014-04-09 NOTE — Telephone Encounter (Signed)
Relevant patient education assigned to patient using Emmi. ° °

## 2014-04-09 NOTE — Patient Instructions (Signed)
Good to see you. We are starting celexa 20 mg daily.  Call me in a few weeks with an update.

## 2014-04-09 NOTE — Assessment & Plan Note (Signed)
Deteriorated. Does ask for yellow DNR form today- per pt, has wanted this for years, not because he is depressed. No SI or HI. Will start Celexa 20 mg daily. Follow up in 3 weeks.

## 2014-04-09 NOTE — Assessment & Plan Note (Signed)
ON elavail and lyrica.

## 2014-04-16 ENCOUNTER — Ambulatory Visit (INDEPENDENT_AMBULATORY_CARE_PROVIDER_SITE_OTHER): Payer: Medicare Other | Admitting: Cardiovascular Disease

## 2014-04-16 ENCOUNTER — Encounter: Payer: Self-pay | Admitting: Cardiovascular Disease

## 2014-04-16 VITALS — BP 169/87 | HR 93 | Ht 74.0 in | Wt 187.2 lb

## 2014-04-16 DIAGNOSIS — Z992 Dependence on renal dialysis: Secondary | ICD-10-CM

## 2014-04-16 DIAGNOSIS — I2789 Other specified pulmonary heart diseases: Secondary | ICD-10-CM

## 2014-04-16 DIAGNOSIS — I071 Rheumatic tricuspid insufficiency: Secondary | ICD-10-CM

## 2014-04-16 DIAGNOSIS — R0602 Shortness of breath: Secondary | ICD-10-CM

## 2014-04-16 DIAGNOSIS — I2729 Other secondary pulmonary hypertension: Secondary | ICD-10-CM | POA: Insufficient documentation

## 2014-04-16 DIAGNOSIS — I2510 Atherosclerotic heart disease of native coronary artery without angina pectoris: Secondary | ICD-10-CM

## 2014-04-16 DIAGNOSIS — N186 End stage renal disease: Secondary | ICD-10-CM

## 2014-04-16 DIAGNOSIS — Z951 Presence of aortocoronary bypass graft: Secondary | ICD-10-CM

## 2014-04-16 DIAGNOSIS — I079 Rheumatic tricuspid valve disease, unspecified: Secondary | ICD-10-CM

## 2014-04-16 DIAGNOSIS — E785 Hyperlipidemia, unspecified: Secondary | ICD-10-CM

## 2014-04-16 HISTORY — DX: Other secondary pulmonary hypertension: I27.29

## 2014-04-16 HISTORY — DX: End stage renal disease: N18.6

## 2014-04-16 NOTE — Assessment & Plan Note (Signed)
Continue treatment with rosuvastatin with a target LDL of less than 70. 

## 2014-04-16 NOTE — Assessment & Plan Note (Signed)
Based on results of previous right heart catheterization from 2014, this is likely due to diastolic heart failure and chronic fluid overload due to end-stage renal disease. Effective fluid management during dialysis his critical.

## 2014-04-16 NOTE — Progress Notes (Signed)
HPI  This is a 43 year old male who is here today for a followup visit. He has known history of three-vessel coronary artery disease diagnosed in 2010. He underwent coronary artery bypass graft surgery in December of 2010 at [facility]. He has multiple chronic conditions that include end-stage renal disease on hemodialysis followed by Dr. Thedore Mins, diabetes, hypertension and hyperlipidemia. He also has peripheral arterial disease managed by Dr. Gilda Crease. He is status post right toes amputation . He underwent a right and left cardiac cath in 12/2012 which showed patent grafts. There was moderately elevated PCWP and moderate pulmonary hypertension. EF was normal by echo.  He was noted to have a new cardiac murmur recently and underwent echocardiogram in May of 2015. This showed an ejection fraction of 50-55%, mild mitral regurgitation, moderate to severe tricuspid regurgitation and moderate to severe pulmonary hypertension. He reports no change in his symptoms. Blood pressure is typically high on days that he does not have dialysis but drops significantly during dialysis.  Allergies  Allergen Reactions  . Amoxicillin-Pot Clavulanate Nausea And Vomiting  . Hydromorphone Hcl     "body started down"  . Rifampin Nausea And Vomiting     Current Outpatient Prescriptions on File Prior to Visit  Medication Sig Dispense Refill  . amitriptyline (ELAVIL) 25 MG tablet TAKE 1 BY MOUTH AT BEDTIME  30 tablet  0  . aspirin 325 MG tablet Take 325 mg by mouth daily with breakfast.       . carvedilol (COREG) 25 MG tablet Take 1 tablet (25 mg total) by mouth 2 (two) times daily with a meal.  180 tablet  1  . cinacalcet (SENSIPAR) 60 MG tablet Take 60 mg by mouth daily at 12 noon. With biggest meal      . citalopram (CELEXA) 20 MG tablet Take 1 tablet (20 mg total) by mouth daily.  30 tablet  3  . furosemide (LASIX) 80 MG tablet Take 1 tablet (80 mg total) by mouth 2 (two) times daily.  60 tablet  6  . insulin aspart  (NOVOLOG) 100 UNIT/ML injection Inject 12 Units into the skin 3 (three) times daily before meals. Plus the sliding scale* Needs appointment *      . insulin glargine (LANTUS) 100 UNIT/ML injection INJECT 60 UNITS SUBCUT-     ANEOUSLY TWO TIMES A DAY ORAS DIRECTED      . Insulin Syringe-Needle U-100 31G X 5/16" 0.5 ML MISC Use 5 times daily as directed * Needs appointment with Dr for any additional refills*  450 each  0  . isosorbide mononitrate (IMDUR) 30 MG 24 hr tablet Take 1 tablet (30 mg total) by mouth 2 (two) times daily.  60 tablet  6  . levothyroxine (SYNTHROID, LEVOTHROID) 75 MCG tablet Take 1 tablet (75 mcg total) by mouth daily before breakfast.  90 tablet  1  . Omega-3 Fatty Acids (FISH OIL) 1000 MG CAPS Take 1,000 mg by mouth daily.       [name] omeprazole (PRILOSEC) 40 MG capsule Take one by mouth daily  90 capsule  3  . permethrin (ACTICIN) 5 % cream Apply 1 application topically once.  60 g  0  . pregabalin (LYRICA) 150 MG capsule Take one capsule by mouth every morning and two capsules every evening.  90 capsule  0  . rosuvastatin (CRESTOR) 20 MG tablet Take 1 tablet (20 mg total) by mouth at bedtime.  30 tablet  5   No current facility-administered medications on  file prior to visit.     Past Medical History  Diagnosis Date  . End stage renal disease on dialysis     LUE fistula  . Type I diabetes mellitus     a. 03/2014 admitted with HNK to [facility]  . Diabetic neuropathy     severe, s/p multiple toe amputation  . Hypothyroidism   . Hypertension   . Hyperlipidemia   . Heart murmur   . H/O hiatal hernia   . GERD (gastroesophageal reflux disease)   . Anxiety   . Sebaceous cyst     side of neck  . Pneumonia     2010  . Anemia     a. req PRBC's 2011.  [name] PAD (peripheral artery disease)     a. s/p amputation of toes on the right;  b. left LE claudication.  . Coronary artery disease     a. s/p MI;  b. 10/2009 CABG x 3 @ [facility]: LIMA->LAD, VG->OM3, VG->RPDA; c. 11/2010 Cath 3/3  patent grafts;  d 12/2012 Cath: LM 30d, LAD 85p, D1 70, D2 90, LCX 40ost, OM2 100, RCA 90p, 141m, L->LAD ok, VG->OM3 ok, VG->RPDA 30, EF 50%->Med Rx.  . Cataract     right  . Valvular disease     a. 11/2012 Echo: EF 55-60%, mild LVH, mild MR, mild bi-atrial enlargement, mild-mod TR, PASP .     Past Surgical History  Procedure Laterality Date  . Dialysis fistula creation      left upper arm fistula  . Amputation      TOES ON BOTH FEET  . Abscess drainage  Behind right ear/occipital scalp  . Skin graft    . Eye surgery  1999  . Coronary artery bypass graft  10/2009    [facility] (Dr. Katrinka Blazing)  . Foot amputation    . Cardiac catheterization    . Cardiac catheterization  2/14    [facility]: severe 3 vessel CAD with patent grafts, RHC: moderately elevated PCW and pulmonary hypertension     Family History  Problem Relation Age of Onset  . Heart disease Other   . Hypertension Other   . Hypertension Mother   . Heart disease Mother   . Diabetes Mother   . Birth defects Paternal Uncle     unaware  . Birth defects Paternal Grandmother     breast     History   Social History  . Marital Status: Single    Spouse Name: N/A    Number of Children: N/A  . Years of Education: N/A   Occupational History  . Not on file.   Social History Main Topics  . Smoking status: Never Smoker   . Smokeless tobacco: Former Neurosurgeon  . Alcohol Use: 0.0 oz/week     Comment: occasional beer  . Drug Use: No  . Sexual Activity: Not Currently   Other Topics Concern  . Not on file   Social History Narrative   He is a DNR- yellow sheet given to pt today.     PHYSICAL EXAM   BP 169/87  Pulse 93  Ht 6\' 2"  (1.88 m)  Wt 187 lb 4 oz (84.936 kg)  BMI 24.03 kg/m2 Constitutional: He is oriented to person, place, and time. He appears well-developed and well-nourished. No distress.  HENT: No nasal discharge.  Head: Normocephalic and atraumatic.  Eyes: Pupils are equal and round. Right eye exhibits no  discharge. Left eye exhibits no discharge.  Neck: Normal range of motion. Neck supple.  JVD present. No thyromegaly present.  Cardiovascular: Normal rate, regular rhythm, normal heart sounds absent distal pulses. Exam reveals no gallop and no friction rub. There is a 2/6 holosystolic murmur at the left sternal border .A loud bruit is here in the left upper chest area and left carotid artery area which seems to be due to his left arm fistula. Pulmonary/Chest: Effort normal and breath sounds normal. No stridor. No respiratory distress. He has no wheezes. He has no rales. He exhibits no tenderness.  Abdominal: Soft. Bowel sounds are normal. He exhibits no distension. There is no tenderness. There is no rebound and no guarding.  Musculoskeletal: Normal range of motion. He exhibits trace edema and no tenderness.  Neurological: He is alert and oriented to person, place, and time. Coordination normal.  Skin: Skin is warm and dry. No rash noted. He is not diaphoretic. No erythema. No pallor.  Psychiatric: He has a normal mood and affect. His behavior is normal. Judgment and thought content normal.     EKG: Sinus  Rhythm  -Prominent R(V1) and right axis -consider right ventricular hypertrophy  -consider pulmonary disease.   ABNORMAL   ASSESSMENT AND PLAN

## 2014-04-16 NOTE — Patient Instructions (Signed)
Continue same medications.   Your physician wants you to follow-up in: 6 months.  You will receive a reminder letter in the mail two months in advance. If you don't receive a letter, please call our office to schedule the follow-up appointment.  

## 2014-04-16 NOTE — Assessment & Plan Note (Signed)
He is overall stable from a cardiac standpoint with no symptoms suggestive of angina. Continue medical therapy.

## 2014-04-16 NOTE — Assessment & Plan Note (Signed)
The patient is having worsening tricuspid regurgitation which I suspect is likely due to pulmonary hypertension. Primary valvular abnormality cannot be completely excluded. However, I think he is a poor candidate for valve repair given that he is on dialysis with significant risk for endocarditis. I recommend continuing medical therapy and observation for now with serial echocardiograms to closely monitor RV systolic function.

## 2014-04-19 ENCOUNTER — Other Ambulatory Visit: Payer: Self-pay | Admitting: *Deleted

## 2014-04-19 MED ORDER — INSULIN LISPRO 100 UNIT/ML ~~LOC~~ SOLN: SUBCUTANEOUS | Status: DC

## 2014-04-19 NOTE — Telephone Encounter (Signed)
Received a rx request for Humalog kwik inj 100/mL from [facility].

## 2014-04-25 ENCOUNTER — Other Ambulatory Visit: Payer: Self-pay | Admitting: Family Medicine

## 2014-04-25 ENCOUNTER — Other Ambulatory Visit: Payer: Self-pay | Admitting: *Deleted

## 2014-04-26 MED ORDER — INSULIN LISPRO 100 UNIT/ML ~~LOC~~ SOLN: 10.0000 [IU] | Freq: Three times a day (TID) | SUBCUTANEOUS | Status: DC

## 2014-05-09 ENCOUNTER — Ambulatory Visit (INDEPENDENT_AMBULATORY_CARE_PROVIDER_SITE_OTHER): Payer: Medicare Other | Admitting: Internal Medicine

## 2014-05-09 ENCOUNTER — Encounter: Payer: Self-pay | Admitting: Internal Medicine

## 2014-05-09 VITALS — BP 124/74 | HR 91 | Temp 98.2°F | Resp 12 | Wt 185.0 lb

## 2014-05-09 DIAGNOSIS — N186 End stage renal disease: Secondary | ICD-10-CM

## 2014-05-09 DIAGNOSIS — E1022 Type 1 diabetes mellitus with diabetic chronic kidney disease: Secondary | ICD-10-CM

## 2014-05-09 DIAGNOSIS — E1029 Type 1 diabetes mellitus with other diabetic kidney complication: Secondary | ICD-10-CM

## 2014-05-09 DIAGNOSIS — I2510 Atherosclerotic heart disease of native coronary artery without angina pectoris: Secondary | ICD-10-CM

## 2014-05-09 NOTE — Patient Instructions (Signed)
-   Continue Lantus 35 units at bedtime - Use Humalog as follows: 10 units with b'fast 10 units with lunch (except in dialysis days, take only 5 units) 14 units with dinner - Humalog sliding scale as follows: - 150-175: + 1 unit  - 176-200: + 2 units  - 201-225: + 3 units  - 226-250: + 4 units  - >250: + 5 units  Please return in 1.5 month with your sugar log.
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
## 2014-05-19 ENCOUNTER — Other Ambulatory Visit: Payer: Self-pay | Admitting: Family Medicine

## 2014-05-21 NOTE — Telephone Encounter (Signed)
Received refill request electronically. Last refill 03/17/13. Last office visit 04/09/14. Is it okay to refill medication?

## 2014-05-21 NOTE — Telephone Encounter (Signed)
Rx called to pharmacy

## 2014-05-30 ENCOUNTER — Other Ambulatory Visit: Payer: Self-pay | Admitting: Family Medicine

## 2014-05-31 ENCOUNTER — Other Ambulatory Visit: Payer: Self-pay

## 2014-05-31 MED ORDER — AMITRIPTYLINE HCL 25 MG PO TABS: ORAL_TABLET | ORAL | Status: DC

## 2014-05-31 NOTE — Telephone Encounter (Signed)
Taylor [name] with [facility] left v/m requesting refill amitriptyline.Please advise.

## 2014-06-17 ENCOUNTER — Other Ambulatory Visit: Payer: Self-pay | Admitting: Family Medicine

## 2014-06-18 NOTE — Telephone Encounter (Signed)
Rx called in to requested pharmacy 

## 2014-06-18 NOTE — Telephone Encounter (Signed)
Pt requesting medication refill. Last f/u appt 04/2014-CPE with no future appts scheduled. pls advise 

## 2014-06-19 ENCOUNTER — Other Ambulatory Visit: Payer: Self-pay | Admitting: Urology

## 2014-06-20 ENCOUNTER — Ambulatory Visit (INDEPENDENT_AMBULATORY_CARE_PROVIDER_SITE_OTHER): Payer: Medicare Other | Admitting: Internal Medicine

## 2014-06-20 ENCOUNTER — Encounter: Payer: Self-pay | Admitting: Internal Medicine

## 2014-06-20 VITALS — BP 168/92 | HR 114 | Temp 97.9°F | Resp 12 | Wt 179.0 lb

## 2014-06-20 DIAGNOSIS — E1022 Type 1 diabetes mellitus with diabetic chronic kidney disease: Secondary | ICD-10-CM

## 2014-06-20 DIAGNOSIS — E1029 Type 1 diabetes mellitus with other diabetic kidney complication: Secondary | ICD-10-CM

## 2014-06-20 DIAGNOSIS — I2510 Atherosclerotic heart disease of native coronary artery without angina pectoris: Secondary | ICD-10-CM

## 2014-06-20 DIAGNOSIS — N186 End stage renal disease: Secondary | ICD-10-CM

## 2014-06-20 LAB — HEMOGLOBIN A1C: Hgb A1c MFr Bld: 10.3 % — ABNORMAL HIGH (ref 4.6–6.5)
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
## 2014-06-20 NOTE — Patient Instructions (Addendum)
Please use the following settings:  In dialysis days: Insulin Before breakfast Before lunch Before dinner Bedtime  Humalog x 5 5   Lantus    35   In non-dialysis days: Insulin Before breakfast Before lunch Before dinner Bedtime  Humalog 10 10 14    Lantus    35   Continue Humalog sliding scale: - 150-175: + 1 unit  - 176-200: + 2 units  - 201-225: + 3 units  - 226-250: + 4 units  - >250: + 5 units  Please stop at the lab.  Please return in 1.5 months with your sugar log.

## 2014-06-29 ENCOUNTER — Ambulatory Visit: Payer: Medicare Other | Admitting: Cardiovascular Disease

## 2014-07-11 ENCOUNTER — Encounter (HOSPITAL_COMMUNITY)
Admission: RE | Admit: 2014-07-11 | Discharge: 2014-07-11 | Disposition: A | Discharge status: Home / Self Care | Payer: Medicare Other | Source: Ambulatory Visit | Attending: Urology | Admitting: Urology

## 2014-07-11 ENCOUNTER — Inpatient Hospital Stay (HOSPITAL_COMMUNITY): Admission: RE | Admit: 2014-07-11 | Payer: Medicare Other | Source: Ambulatory Visit

## 2014-07-11 ENCOUNTER — Encounter (HOSPITAL_COMMUNITY): Payer: Self-pay

## 2014-07-11 ENCOUNTER — Encounter (HOSPITAL_COMMUNITY): Payer: Self-pay | Admitting: Pharmacy Technician

## 2014-07-11 ENCOUNTER — Ambulatory Visit (HOSPITAL_COMMUNITY)
Admission: RE | Admit: 2014-07-11 | Discharge: 2014-07-11 | Disposition: A | Discharge status: Home / Self Care | Payer: Medicare Other | Source: Ambulatory Visit | Attending: Anesthesiology | Admitting: Anesthesiology

## 2014-07-11 DIAGNOSIS — Z01818 Encounter for other preprocedural examination: Secondary | ICD-10-CM | POA: Diagnosis not present

## 2014-07-11 DIAGNOSIS — I517 Cardiomegaly: Secondary | ICD-10-CM | POA: Insufficient documentation

## 2014-07-11 DIAGNOSIS — I10 Essential (primary) hypertension: Secondary | ICD-10-CM | POA: Diagnosis not present

## 2014-07-11 HISTORY — DX: Major depressive disorder, single episode, unspecified: F32.9

## 2014-07-11 HISTORY — DX: Depression, unspecified: F32.A

## 2014-07-11 HISTORY — DX: Unspecified osteoarthritis, unspecified site: M19.90

## 2014-07-11 NOTE — Progress Notes (Signed)
Anesthesia aware of 2V CXR report done 07/11/2014.  No new orders given.  Aware patient is a dialysis patient and patient states he will have dialysis day before surgery.  Patient also has history of CHF and diabetes and anesthesia aware.

## 2014-07-11 NOTE — Progress Notes (Signed)
2V CXR faxed via EPIC to Dr Patsi Sears.

## 2014-07-11 NOTE — Progress Notes (Signed)
ESRD on dialysis- Tues/Thurs/Sat - [facility] in [place].  Stress 11/18/12 EPIC  EKG 04/16/14 [facility]  LOV with Dr Kirke Corin- 04/2014 in [facility]

## 2014-07-11 NOTE — Patient Instructions (Addendum)
Taylor [name]  07/11/2014   Your procedure is scheduled on:  07/24/2014    Report to [facility].  Follow the Signs to Short Stay Center at  0630      am  Call this number if you have problems the morning of surgery: [phone]   Remember: Eat a good healthy snack prior to bedtime.     Do not eat food or drink liquids after midnight.   Take these medicines the morning of surgery with A SIP OF WATER: Coreg, Clonidine, Imdur, Levothyroxine, Omeprazole             Take 1/2 of evening dose of Insulin nite before surgery.     Do not wear jewelry,   Do not wear lotions, powders, or perfumes. , deodorant.     Men may shave face and neck.  Do not bring valuables to the hospital.  Contacts, dentures or bridgework may not be worn into surgery.  Leave suitcase in the car. After surgery it may be brought to your room.  For patients admitted to the hospital, checkout time is 11:00 AM the day of  discharge.          Please read over the following fact sheets that you were given: [facility] - Preparing for Surgery Before surgery, you can play an important role.  Because skin is not sterile, your skin needs to be as free of germs as possible.  You can reduce the number of germs on your skin by washing with CHG (chlorahexidine gluconate) soap before surgery.  CHG is an antiseptic cleaner which kills germs and bonds with the skin to continue killing germs even after washing. Please DO NOT use if you have an allergy to CHG or antibacterial soaps.  If your skin becomes reddened/irritated stop using the CHG and inform your nurse when you arrive at Short Stay. Do not shave (including legs and underarms) for at least 48 hours prior to the first CHG shower.  You may shave your face/neck. Please follow these instructions carefully:  1.  Shower with CHG Soap the night before surgery and the  morning of Surgery.  2.  If you choose to wash your hair, wash your hair first as usual with your   normal  shampoo.  3.  After you shampoo, rinse your hair and body thoroughly to remove the  shampoo.                           4.  Use CHG as you would any other liquid soap.  You can apply chg directly  to the skin and wash                       Gently with a scrungie or clean washcloth.  5.  Apply the CHG Soap to your body ONLY FROM THE NECK DOWN.   Do not use on face/ open                           Wound or open sores. Avoid contact with eyes, ears mouth and genitals (private parts).                       Wash face,  Genitals (private parts) with your normal soap.             6.  Wash thoroughly, paying  special attention to the area where your surgery  will be performed.  7.  Thoroughly rinse your body with warm water from the neck down.  8.  DO NOT shower/wash with your normal soap after using and rinsing off  the CHG Soap.                9.  Pat yourself dry with a clean towel.            10.  Wear clean pajamas.            11.  Place clean sheets on your bed the night of your first shower and do not  sleep with pets. Day of Surgery : Do not apply any lotions/deodorants the morning of surgery.  Please wear clean clothes to the hospital/surgery center.  FAILURE TO FOLLOW THESE INSTRUCTIONS MAY RESULT IN THE CANCELLATION OF YOUR SURGERY PATIENT SIGNATURE_________________________________  NURSE SIGNATURE__________________________________  ________________________________________________________________________ , coughing and deep breathing exercises, leg exercises

## 2014-07-12 ENCOUNTER — Other Ambulatory Visit: Payer: Self-pay | Admitting: Family Medicine

## 2014-07-24 ENCOUNTER — Encounter (HOSPITAL_COMMUNITY): Payer: Medicare Other | Admitting: Anesthesiology

## 2014-07-24 ENCOUNTER — Observation Stay (HOSPITAL_COMMUNITY)
Admission: RE | Admit: 2014-07-24 | Discharge: 2014-07-25 | Disposition: A | Discharge status: Home / Self Care | Payer: Medicare Other | Source: Ambulatory Visit | Attending: Urology | Admitting: Urology

## 2014-07-24 ENCOUNTER — Encounter (HOSPITAL_COMMUNITY)
Admission: RE | Disposition: A | Discharge status: Home / Self Care | Payer: Self-pay | Source: Ambulatory Visit | Attending: Urology

## 2014-07-24 ENCOUNTER — Encounter (HOSPITAL_COMMUNITY): Payer: Self-pay | Admitting: *Deleted

## 2014-07-24 ENCOUNTER — Ambulatory Visit (HOSPITAL_COMMUNITY): Payer: Medicare Other | Admitting: Anesthesiology

## 2014-07-24 DIAGNOSIS — E291 Testicular hypofunction: Secondary | ICD-10-CM | POA: Insufficient documentation

## 2014-07-24 DIAGNOSIS — K219 Gastro-esophageal reflux disease without esophagitis: Secondary | ICD-10-CM | POA: Diagnosis not present

## 2014-07-24 DIAGNOSIS — N529 Male erectile dysfunction, unspecified: Principal | ICD-10-CM | POA: Insufficient documentation

## 2014-07-24 DIAGNOSIS — I861 Scrotal varices: Secondary | ICD-10-CM | POA: Insufficient documentation

## 2014-07-24 DIAGNOSIS — I252 Old myocardial infarction: Secondary | ICD-10-CM | POA: Diagnosis not present

## 2014-07-24 DIAGNOSIS — I7389 Other specified peripheral vascular diseases: Secondary | ICD-10-CM | POA: Diagnosis not present

## 2014-07-24 DIAGNOSIS — I120 Hypertensive chronic kidney disease with stage 5 chronic kidney disease or end stage renal disease: Secondary | ICD-10-CM | POA: Diagnosis not present

## 2014-07-24 DIAGNOSIS — I509 Heart failure, unspecified: Secondary | ICD-10-CM | POA: Diagnosis not present

## 2014-07-24 DIAGNOSIS — E1149 Type 2 diabetes mellitus with other diabetic neurological complication: Secondary | ICD-10-CM | POA: Insufficient documentation

## 2014-07-24 DIAGNOSIS — F411 Generalized anxiety disorder: Secondary | ICD-10-CM | POA: Diagnosis not present

## 2014-07-24 DIAGNOSIS — I2510 Atherosclerotic heart disease of native coronary artery without angina pectoris: Secondary | ICD-10-CM | POA: Insufficient documentation

## 2014-07-24 DIAGNOSIS — E1142 Type 2 diabetes mellitus with diabetic polyneuropathy: Secondary | ICD-10-CM | POA: Diagnosis not present

## 2014-07-24 DIAGNOSIS — Z951 Presence of aortocoronary bypass graft: Secondary | ICD-10-CM | POA: Insufficient documentation

## 2014-07-24 DIAGNOSIS — Z992 Dependence on renal dialysis: Secondary | ICD-10-CM | POA: Diagnosis not present

## 2014-07-24 DIAGNOSIS — Z794 Long term (current) use of insulin: Secondary | ICD-10-CM | POA: Diagnosis not present

## 2014-07-24 DIAGNOSIS — I2789 Other specified pulmonary heart diseases: Secondary | ICD-10-CM | POA: Diagnosis not present

## 2014-07-24 DIAGNOSIS — N500 Atrophy of testis: Secondary | ICD-10-CM | POA: Insufficient documentation

## 2014-07-24 DIAGNOSIS — S98919A Complete traumatic amputation of unspecified foot, level unspecified, initial encounter: Secondary | ICD-10-CM | POA: Diagnosis not present

## 2014-07-24 DIAGNOSIS — E039 Hypothyroidism, unspecified: Secondary | ICD-10-CM | POA: Diagnosis not present

## 2014-07-24 DIAGNOSIS — K449 Diaphragmatic hernia without obstruction or gangrene: Secondary | ICD-10-CM | POA: Insufficient documentation

## 2014-07-24 DIAGNOSIS — N186 End stage renal disease: Secondary | ICD-10-CM | POA: Insufficient documentation

## 2014-07-24 DIAGNOSIS — R37 Sexual dysfunction, unspecified: Secondary | ICD-10-CM | POA: Diagnosis present

## 2014-07-24 HISTORY — PX: PENILE PROSTHESIS IMPLANT: SHX240

## 2014-07-24 LAB — GLUCOSE, CAPILLARY
GLUCOSE-CAPILLARY: 160 mg/dL — AB (ref 70–99)
GLUCOSE-CAPILLARY: 179 mg/dL — AB (ref 70–99)
GLUCOSE-CAPILLARY: 228 mg/dL — AB (ref 70–99)
GLUCOSE-CAPILLARY: 236 mg/dL — AB (ref 70–99)
GLUCOSE-CAPILLARY: 252 mg/dL — AB (ref 70–99)
GLUCOSE-CAPILLARY: 269 mg/dL — AB (ref 70–99)
GLUCOSE-CAPILLARY: 328 mg/dL — AB (ref 70–99)
Glucose-Capillary: 220 mg/dL — ABNORMAL HIGH (ref 70–99)

## 2014-07-24 LAB — BASIC METABOLIC PANEL
Anion gap: 16 — ABNORMAL HIGH (ref 5–15)
BUN: 51 mg/dL — ABNORMAL HIGH (ref 6–23)
CALCIUM: 9.4 mg/dL (ref 8.4–10.5)
CO2: 26 meq/L (ref 19–32)
CREATININE: 6.14 mg/dL — AB (ref 0.50–1.35)
Chloride: 92 mEq/L — ABNORMAL LOW (ref 96–112)
GFR calc Af Amer: 12 mL/min — ABNORMAL LOW (ref >90)
GFR calc non Af Amer: 10 mL/min — ABNORMAL LOW (ref >90)
Glucose, Bld: 168 mg/dL — ABNORMAL HIGH (ref 70–99)
Potassium: 5.8 mEq/L — ABNORMAL HIGH (ref 3.7–5.3)
Sodium: 134 mEq/L — ABNORMAL LOW (ref 137–147)

## 2014-07-24 LAB — CBC
HCT: 34.7 % — ABNORMAL LOW (ref 39.0–52.0)
HEMATOCRIT: 35.5 % — AB (ref 39.0–52.0)
HEMOGLOBIN: 10.9 g/dL — AB (ref 13.0–17.0)
Hemoglobin: 11.4 g/dL — ABNORMAL LOW (ref 13.0–17.0)
MCH: 29.2 pg (ref 26.0–34.0)
MCH: 28.9 pg (ref 26.0–34.0)
MCHC: 32.1 g/dL (ref 30.0–36.0)
MCHC: 31.4 g/dL (ref 30.0–36.0)
MCV: 91 fL (ref 78.0–100.0)
MCV: 92 fL (ref 78.0–100.0)
Platelets: 81 10*3/uL — ABNORMAL LOW (ref 150–400)
Platelets: 73 10*3/uL — ABNORMAL LOW (ref 150–400)
RBC: 3.9 MIL/uL — ABNORMAL LOW (ref 4.22–5.81)
RBC: 3.77 MIL/uL — ABNORMAL LOW (ref 4.22–5.81)
RDW: 15.7 % — AB (ref 11.5–15.5)
RDW: 15.8 % — ABNORMAL HIGH (ref 11.5–15.5)
WBC: 6.2 10*3/uL (ref 4.0–10.5)
WBC: 5.3 10*3/uL (ref 4.0–10.5)

## 2014-07-24 LAB — CREATININE, SERUM
CREATININE: 6.3 mg/dL — AB (ref 0.50–1.35)
GFR calc Af Amer: 11 mL/min — ABNORMAL LOW (ref >90)
GFR, EST NON AFRICAN AMERICAN: 10 mL/min — AB (ref >90)

## 2014-07-24 SURGERY — INSERTION, PENILE PROSTHESIS, INFLATABLE
Anesthesia: General

## 2014-07-24 MED ORDER — INSULIN ASPART 100 UNIT/ML ~~LOC~~ SOLN
0.0000 [IU] | SUBCUTANEOUS | Status: DC
  Administered 2014-07-24: 11 [IU] via SUBCUTANEOUS
  Administered 2014-07-24: 3 [IU] via SUBCUTANEOUS
  Administered 2014-07-24: 8 [IU] via SUBCUTANEOUS
  Administered 2014-07-24: 5 [IU] via SUBCUTANEOUS

## 2014-07-24 MED ORDER — FUROSEMIDE 80 MG PO TABS
80.0000 mg | ORAL_TABLET | Freq: Two times a day (BID) | ORAL | Status: DC
  Administered 2014-07-24 – 2014-07-25 (×2): 80 mg via ORAL
  Filled 2014-07-24 (×4): qty 1

## 2014-07-24 MED ORDER — SODIUM CHLORIDE 0.45 % IV SOLN
INTRAVENOUS | Status: DC
  Administered 2014-07-24: 14:00:00 via INTRAVENOUS

## 2014-07-24 MED ORDER — OXYCODONE-ACETAMINOPHEN 5-325 MG PO TABS
1.0000 | ORAL_TABLET | ORAL | Status: DC | PRN
  Administered 2014-07-24 – 2014-07-25 (×2): 2 via ORAL
  Filled 2014-07-24 (×2): qty 2

## 2014-07-24 MED ORDER — EPHEDRINE SULFATE 50 MG/ML IJ SOLN
INTRAMUSCULAR | Status: AC
  Filled 2014-07-24: qty 1

## 2014-07-24 MED ORDER — FENTANYL CITRATE 0.05 MG/ML IJ SOLN
INTRAMUSCULAR | Status: AC
  Filled 2014-07-24: qty 2

## 2014-07-24 MED ORDER — BUPIVACAINE HCL (PF) 0.5 % IJ SOLN
INTRAMUSCULAR | Status: AC
  Filled 2014-07-24: qty 30

## 2014-07-24 MED ORDER — AMITRIPTYLINE HCL 25 MG PO TABS
25.0000 mg | ORAL_TABLET | Freq: Every day | ORAL | Status: DC
  Administered 2014-07-24: 25 mg via ORAL
  Filled 2014-07-24 (×2): qty 1

## 2014-07-24 MED ORDER — PANTOPRAZOLE SODIUM 40 MG PO TBEC
40.0000 mg | DELAYED_RELEASE_TABLET | Freq: Every day | ORAL | Status: DC
  Filled 2014-07-24: qty 1

## 2014-07-24 MED ORDER — SODIUM CHLORIDE 0.9 % IR SOLN
Status: AC
  Filled 2014-07-24: qty 1

## 2014-07-24 MED ORDER — FENTANYL CITRATE 0.05 MG/ML IJ SOLN
25.0000 ug | INTRAMUSCULAR | Status: DC | PRN
  Administered 2014-07-24: 50 ug via INTRAVENOUS

## 2014-07-24 MED ORDER — ISOSORBIDE MONONITRATE ER 30 MG PO TB24
30.0000 mg | ORAL_TABLET | Freq: Two times a day (BID) | ORAL | Status: DC
  Administered 2014-07-24: 30 mg via ORAL
  Filled 2014-07-24 (×3): qty 1

## 2014-07-24 MED ORDER — SODIUM CHLORIDE 0.9 % IR SOLN
Status: DC | PRN
  Administered 2014-07-24: 11:00:00

## 2014-07-24 MED ORDER — INSULIN ASPART 100 UNIT/ML ~~LOC~~ SOLN
SUBCUTANEOUS | Status: AC
  Filled 2014-07-24: qty 1

## 2014-07-24 MED ORDER — POLYMYXIN B SULFATE 500000 UNITS IJ SOLR
INTRAMUSCULAR | Status: AC
  Filled 2014-07-24: qty 1

## 2014-07-24 MED ORDER — SEVELAMER CARBONATE 800 MG PO TABS
800.0000 mg | ORAL_TABLET | Freq: Three times a day (TID) | ORAL | Status: DC
  Administered 2014-07-24 – 2014-07-25 (×2): 800 mg via ORAL
  Filled 2014-07-24 (×5): qty 1

## 2014-07-24 MED ORDER — SODIUM CHLORIDE 0.9 % IJ SOLN
INTRAMUSCULAR | Status: AC
  Filled 2014-07-24: qty 10

## 2014-07-24 MED ORDER — MIDAZOLAM HCL 2 MG/2ML IJ SOLN
INTRAMUSCULAR | Status: AC
  Filled 2014-07-24: qty 2

## 2014-07-24 MED ORDER — FENTANYL CITRATE 0.05 MG/ML IJ SOLN
INTRAMUSCULAR | Status: AC
  Filled 2014-07-24: qty 5

## 2014-07-24 MED ORDER — CINACALCET HCL 30 MG PO TABS
60.0000 mg | ORAL_TABLET | Freq: Every day | ORAL | Status: DC
  Administered 2014-07-25: 60 mg via ORAL
  Filled 2014-07-24 (×2): qty 2

## 2014-07-24 MED ORDER — CLINDAMYCIN PHOSPHATE 900 MG/50ML IV SOLN
INTRAVENOUS | Status: AC
  Filled 2014-07-24: qty 50

## 2014-07-24 MED ORDER — ONDANSETRON HCL 4 MG/2ML IJ SOLN
INTRAMUSCULAR | Status: AC
  Filled 2014-07-24: qty 2

## 2014-07-24 MED ORDER — ATORVASTATIN CALCIUM 10 MG PO TABS
10.0000 mg | ORAL_TABLET | Freq: Every day | ORAL | Status: DC
  Administered 2014-07-24: 10 mg via ORAL
  Filled 2014-07-24 (×2): qty 1

## 2014-07-24 MED ORDER — INSULIN GLARGINE 100 UNIT/ML ~~LOC~~ SOLN
15.0000 [IU] | Freq: Every day | SUBCUTANEOUS | Status: DC
  Administered 2014-07-24: 15 [IU] via SUBCUTANEOUS
  Filled 2014-07-24: qty 0.15

## 2014-07-24 MED ORDER — SODIUM CHLORIDE 0.9 % IV SOLN
INTRAVENOUS | Status: DC | PRN
  Administered 2014-07-24: 08:00:00 via INTRAVENOUS

## 2014-07-24 MED ORDER — CIPROFLOXACIN HCL 250 MG PO TABS
250.0000 mg | ORAL_TABLET | Freq: Two times a day (BID) | ORAL | Status: DC
  Administered 2014-07-24 – 2014-07-25 (×2): 250 mg via ORAL
  Filled 2014-07-24 (×4): qty 1

## 2014-07-24 MED ORDER — LACTATED RINGERS IV SOLN: INTRAVENOUS | Status: DC

## 2014-07-24 MED ORDER — CIPROFLOXACIN IN D5W 400 MG/200ML IV SOLN
400.0000 mg | INTRAVENOUS | Status: AC
  Administered 2014-07-24: 400 mg via INTRAVENOUS

## 2014-07-24 MED ORDER — MIDAZOLAM HCL 5 MG/5ML IJ SOLN
INTRAMUSCULAR | Status: DC | PRN
  Administered 2014-07-24: 2 mg via INTRAVENOUS

## 2014-07-24 MED ORDER — PROPOFOL 10 MG/ML IV BOLUS
INTRAVENOUS | Status: DC | PRN
  Administered 2014-07-24: 150 mg via INTRAVENOUS

## 2014-07-24 MED ORDER — ATROPINE SULFATE 0.4 MG/ML IJ SOLN
INTRAMUSCULAR | Status: AC
  Filled 2014-07-24: qty 1

## 2014-07-24 MED ORDER — PREGABALIN 75 MG PO CAPS
150.0000 mg | ORAL_CAPSULE | Freq: Three times a day (TID) | ORAL | Status: DC
  Administered 2014-07-24: 150 mg via ORAL
  Filled 2014-07-24: qty 2

## 2014-07-24 MED ORDER — CARVEDILOL 25 MG PO TABS
25.0000 mg | ORAL_TABLET | Freq: Two times a day (BID) | ORAL | Status: DC
  Administered 2014-07-24 – 2014-07-25 (×2): 25 mg via ORAL
  Filled 2014-07-24 (×4): qty 1

## 2014-07-24 MED ORDER — CIPROFLOXACIN IN D5W 400 MG/200ML IV SOLN
INTRAVENOUS | Status: AC
  Filled 2014-07-24: qty 200

## 2014-07-24 MED ORDER — BELLADONNA ALKALOIDS-OPIUM 16.2-60 MG RE SUPP
RECTAL | Status: AC
  Filled 2014-07-24: qty 1

## 2014-07-24 MED ORDER — ASPIRIN 325 MG PO TABS
325.0000 mg | ORAL_TABLET | Freq: Every day | ORAL | Status: DC
  Administered 2014-07-25: 325 mg via ORAL
  Filled 2014-07-24 (×2): qty 1

## 2014-07-24 MED ORDER — INFLUENZA VAC SPLIT QUAD 0.5 ML IM SUSY
0.5000 mL | PREFILLED_SYRINGE | INTRAMUSCULAR | Status: DC
  Filled 2014-07-24 (×2): qty 0.5

## 2014-07-24 MED ORDER — ENOXAPARIN SODIUM 30 MG/0.3ML ~~LOC~~ SOLN
30.0000 mg | SUBCUTANEOUS | Status: DC
  Administered 2014-07-25: 30 mg via SUBCUTANEOUS
  Filled 2014-07-24 (×2): qty 0.3

## 2014-07-24 MED ORDER — OMEGA-3-ACID ETHYL ESTERS 1 G PO CAPS
1.0000 g | ORAL_CAPSULE | Freq: Every day | ORAL | Status: DC
  Administered 2014-07-24: 1 g via ORAL
  Filled 2014-07-24 (×2): qty 1

## 2014-07-24 MED ORDER — CLINDAMYCIN PHOSPHATE 900 MG/50ML IV SOLN
900.0000 mg | Freq: Once | INTRAVENOUS | Status: AC
  Administered 2014-07-24: 900 mg via INTRAVENOUS

## 2014-07-24 MED ORDER — MORPHINE SULFATE 2 MG/ML IJ SOLN
2.0000 mg | INTRAMUSCULAR | Status: DC | PRN
  Administered 2014-07-24 (×2): 2 mg via INTRAVENOUS
  Administered 2014-07-25 (×3): 3 mg via INTRAVENOUS
  Filled 2014-07-24: qty 1
  Filled 2014-07-24 (×2): qty 2
  Filled 2014-07-24: qty 1
  Filled 2014-07-24: qty 2

## 2014-07-24 MED ORDER — LEVOTHYROXINE SODIUM 75 MCG PO TABS
75.0000 ug | ORAL_TABLET | Freq: Every day | ORAL | Status: DC
  Administered 2014-07-25: 75 ug via ORAL
  Filled 2014-07-24 (×2): qty 1

## 2014-07-24 MED ORDER — INSULIN ASPART 100 UNIT/ML ~~LOC~~ SOLN
12.0000 [IU] | Freq: Three times a day (TID) | SUBCUTANEOUS | Status: DC
  Administered 2014-07-24: 12 [IU] via SUBCUTANEOUS

## 2014-07-24 MED ORDER — CLONIDINE HCL 0.1 MG PO TABS
0.1000 mg | ORAL_TABLET | Freq: Two times a day (BID) | ORAL | Status: DC
  Administered 2014-07-24: 0.1 mg via ORAL
  Filled 2014-07-24 (×3): qty 1

## 2014-07-24 MED ORDER — SODIUM CHLORIDE 0.9 % IV SOLN: INTRAVENOUS | Status: DC

## 2014-07-24 MED ORDER — LIDOCAINE HCL (PF) 2 % IJ SOLN
INTRAMUSCULAR | Status: DC | PRN
  Administered 2014-07-24: 75 mg via INTRADERMAL

## 2014-07-24 MED ORDER — FENTANYL CITRATE 0.05 MG/ML IJ SOLN
INTRAMUSCULAR | Status: DC | PRN
  Administered 2014-07-24: 25 ug via INTRAVENOUS
  Administered 2014-07-24: 50 ug via INTRAVENOUS
  Administered 2014-07-24: 25 ug via INTRAVENOUS
  Administered 2014-07-24 (×2): 50 ug via INTRAVENOUS

## 2014-07-24 MED ORDER — CITALOPRAM HYDROBROMIDE 20 MG PO TABS
20.0000 mg | ORAL_TABLET | Freq: Every morning | ORAL | Status: DC
  Administered 2014-07-24: 20 mg via ORAL
  Filled 2014-07-24 (×2): qty 1

## 2014-07-24 MED ORDER — PROPOFOL 10 MG/ML IV BOLUS
INTRAVENOUS | Status: AC
  Filled 2014-07-24: qty 20

## 2014-07-24 MED ORDER — ONDANSETRON HCL 4 MG/2ML IJ SOLN
INTRAMUSCULAR | Status: DC | PRN
  Administered 2014-07-24: 4 mg via INTRAVENOUS

## 2014-07-24 MED ORDER — BUPIVACAINE HCL (PF) 0.5 % IJ SOLN: INTRAMUSCULAR | Status: DC | PRN

## 2014-07-24 MED ORDER — LIDOCAINE HCL (CARDIAC) 20 MG/ML IV SOLN
INTRAVENOUS | Status: AC
  Filled 2014-07-24: qty 5

## 2014-07-24 SURGICAL SUPPLY — 50 items
ADH SKN CLS APL DERMABOND .7 (GAUZE/BANDAGES/DRESSINGS) ×1
APL SKNCLS STERI-STRIP NONHPOA (GAUZE/BANDAGES/DRESSINGS) ×1
BAG URINE DRAINAGE (UROLOGICAL SUPPLIES) ×3 IMPLANT
BANDAGE COBAN STERILE 2 (GAUZE/BANDAGES/DRESSINGS) ×2 IMPLANT
BENZOIN TINCTURE PRP APPL 2/3 (GAUZE/BANDAGES/DRESSINGS) ×3 IMPLANT
BLADE HEX COATED 2.75 (ELECTRODE) ×3 IMPLANT
BNDG GAUZE ELAST 4 BULKY (GAUZE/BANDAGES/DRESSINGS) ×3 IMPLANT
BRIEF STRETCH FOR OB PAD LRG (UNDERPADS AND DIAPERS) ×3 IMPLANT
CANISTER SUCTION 2500CC (MISCELLANEOUS) ×3 IMPLANT
CATH FOLEY 2WAY SLVR  5CC 16FR (CATHETERS) ×2
CATH FOLEY 2WAY SLVR 5CC 16FR (CATHETERS) ×1 IMPLANT
CLOSURE WOUND 1/2 X4 (GAUZE/BANDAGES/DRESSINGS) ×1
COVER MAYO STAND STRL (DRAPES) ×3 IMPLANT
DERMABOND ADVANCED (GAUZE/BANDAGES/DRESSINGS) ×2
DERMABOND ADVANCED .7 DNX12 (GAUZE/BANDAGES/DRESSINGS) IMPLANT
DISSECTOR ROUND CHERRY 3/8 STR (MISCELLANEOUS) ×3 IMPLANT
DRAIN PENROSE 18X1/2 LTX STRL (DRAIN) ×2 IMPLANT
DRAPE LAPAROTOMY T 102X78X121 (DRAPES) ×3 IMPLANT
DRAPE UTILITY 15X26 (DRAPE) ×1 IMPLANT
DRSG TEGADERM 4X4.75 (GAUZE/BANDAGES/DRESSINGS) ×3 IMPLANT
GAUZE SPONGE 4X4 12PLY STRL (GAUZE/BANDAGES/DRESSINGS) IMPLANT
GLOVE BIOGEL M STRL SZ7.5 (GLOVE) ×3 IMPLANT
GOWN STRL REUS W/TWL XL LVL3 (GOWN DISPOSABLE) ×3 IMPLANT
KIT ASSEMBLY MENTOR (KITS) ×2 IMPLANT
KIT BASIN OR (CUSTOM PROCEDURE TRAY) ×3 IMPLANT
KIT TITAN ASSEMBLY (Erectile Restoration) ×1 IMPLANT
KIT TITAN ASSEMBLY STANDARD (Erectile Restoration) ×1 IMPLANT
KIT TITAN ASSEMBLY STD (Erectile Restoration) IMPLANT
NS IRRIG 1000ML POUR BTL (IV SOLUTION) ×3 IMPLANT
PACK GENERAL/GYN (CUSTOM PROCEDURE TRAY) ×3 IMPLANT
PLUG CATH AND CAP STER (CATHETERS) ×3 IMPLANT
PROS TITAN SCROT 0 ANG 16CM (ERECTILE RESTORATION) ×3
PROSTHESIS TTN SCRO 0 ANG 16CM (ERECTILE RESTORATION) IMPLANT
RESERVOIR 75CC LOCKOUT BIOFLEX (Erectile Restoration) ×2 IMPLANT
RETRACTOR WILSON SYSTEM (INSTRUMENTS) ×2 IMPLANT
SCRUB PCMX 4 OZ (MISCELLANEOUS) ×1 IMPLANT
SOL PREP POV-IOD 4OZ 10% (MISCELLANEOUS) ×3 IMPLANT
SOL PREP PROV IODINE SCRUB 4OZ (MISCELLANEOUS) ×3 IMPLANT
SPONGE LAP 4X18 X RAY DECT (DISPOSABLE) ×2 IMPLANT
STAPLER VISISTAT 35W (STAPLE) ×1 IMPLANT
STRIP CLOSURE SKIN 1/2X4 (GAUZE/BANDAGES/DRESSINGS) ×2 IMPLANT
SURGILUBE 3G PEEL PACK STRL (MISCELLANEOUS) ×15 IMPLANT
SUT VIC AB 2-0 UR6 27 (SUTURE) ×18 IMPLANT
SUT VIC AB 3-0 54XBRD REEL (SUTURE) ×1 IMPLANT
SUT VIC AB 3-0 BRD 54 (SUTURE) ×3
SYR 20CC LL (SYRINGE) ×3 IMPLANT
SYR 50ML LL SCALE MARK (SYRINGE) ×6 IMPLANT
TOWEL OR 17X26 10 PK STRL BLUE (TOWEL DISPOSABLE) ×3 IMPLANT
TOWEL OR NON WOVEN STRL DISP B (DISPOSABLE) ×3 IMPLANT
WATER STERILE IRR 1500ML POUR (IV SOLUTION) ×3 IMPLANT

## 2014-07-24 NOTE — Anesthesia Postprocedure Evaluation (Signed)
  Anesthesia Post-op Note  Patient: Taylor [name]  Procedure(s) Performed: Procedure(s) (LRB): SALINE PENILE INJECTION WITH DISSECTION OF CORPORA ,  IMPLANTATION OF COLOPLAST PENILE PROTHESIS INFLATABLE (N/A)  Patient Location: PACU  Anesthesia Type: General  Level of Consciousness: awake and alert   Airway and Oxygen Therapy: Patient Spontanous Breathing  Post-op Pain: mild  Post-op Assessment: Post-op Vital signs reviewed, Patient's Cardiovascular Status Stable, Respiratory Function Stable, Patent Airway and No signs of Nausea or vomiting  Last Vitals:  Filed Vitals:   07/24/14 1300  BP: 120/69  Pulse: 62  Temp:   Resp: 11    Post-op Vital Signs: stable   Complications: No apparent anesthesia complications

## 2014-07-24 NOTE — Interval H&P Note (Signed)
History and Physical Interval Note:  07/24/2014 8:23 AM  Taylor [name]  has presented today for surgery, with the diagnosis of erectile dysfunction  The various methods of treatment have been discussed with the patient and family. After consideration of risks, benefits and other options for treatment, the patient has consented to  Procedure(s): IMPLANTATION OF COLOPLAST PENILE PROTHESIS INFLATABLE (N/A) as a surgical intervention .  The patient's history has been reviewed, patient examined, no change in status, stable for surgery.  I have reviewed the patient's chart and labs.  Questions were answered to the patient's satisfaction.     Taylor [name] I

## 2014-07-24 NOTE — Progress Notes (Signed)
Assumed care of patient from [place], RN at [phone]. Agree with her initial assessment. Will continue to monitor patient and follow up with plan of care.

## 2014-07-24 NOTE — Interval H&P Note (Signed)
History and Physical Interval Note:  07/24/2014 8:22 AM  Taylor [name]  has presented today for surgery, with the diagnosis of erectile dysfunction  The various methods of treatment have been discussed with the patient and family. After consideration of risks, benefits and other options for treatment, the patient has consented to  Procedure(s): IMPLANTATION OF COLOPLAST PENILE PROTHESIS INFLATABLE (N/A) as a surgical intervention .  The patient's history has been reviewed, patient examined, no change in status, stable for surgery.  I have reviewed the patient's chart and labs.  Questions were answered to the patient's satisfaction.     [name]

## 2014-07-24 NOTE — Transfer of Care (Signed)
Immediate Anesthesia Transfer of Care Note  Patient: Taylor [name]  Procedure(s) Performed: Procedure(s): SALINE PENILE INJECTION WITH DISSECTION OF CORPORA ,  IMPLANTATION OF COLOPLAST PENILE PROTHESIS INFLATABLE (N/A)  Patient Location: PACU  Anesthesia Type:General  Level of Consciousness: awake, alert , oriented and patient cooperative  Airway & Oxygen Therapy: Patient Spontanous Breathing and Patient connected to face mask oxygen  Post-op Assessment: Report given to PACU RN, Post -op Vital signs reviewed and stable and Patient moving all extremities X 4  Post vital signs: Reviewed and stable  Complications: No apparent anesthesia complications
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
## 2014-07-24 NOTE — Anesthesia Preprocedure Evaluation (Addendum)
Anesthesia Evaluation  Patient identified by MRN, date of birth, ID band Patient awake    Reviewed: Allergy & Precautions, H&P , NPO status , Patient's Chart, lab work & pertinent test results, reviewed documented beta blocker date and time   Airway Mallampati: II TM Distance: >3 FB Neck ROM: full    Dental  (+) Edentulous Upper, Dental Advisory Given   Pulmonary neg pulmonary ROS, shortness of breath and with exertion,  breath sounds clear to auscultation  Pulmonary exam normal       Cardiovascular Exercise Tolerance: Poor hypertension, Pt. on home beta blockers and Pt. on medications + CAD, + Past MI, + CABG and +CHF + Valvular Problems/Murmurs Rhythm:regular Rate:Normal  Pulmonary htn. TR   Neuro/Psych negative neurological ROS  negative psych ROS   GI/Hepatic negative GI ROS, Neg liver ROS, hiatal hernia, GERD-  Medicated and Controlled,  Endo/Other  diabetes, Poorly Controlled, Type 2, Insulin Dependent, Oral Hypoglycemic AgentsHypothyroidism   Renal/GU ESRFRenal diseaseOn dialysis  negative genitourinary   Musculoskeletal   Abdominal   Peds  Hematology negative hematology ROS (+) Platelets   Anesthesia Other Findings   Reproductive/Obstetrics negative OB ROS                         Anesthesia Physical Anesthesia Plan  ASA: IV  Anesthesia Plan: General   Post-op Pain Management:    Induction: Intravenous  Airway Management Planned: LMA  Additional Equipment:   Intra-op Plan:   Post-operative Plan:   Informed Consent: I have reviewed the patients History and Physical, chart, labs and discussed the procedure including the risks, benefits and alternatives for the proposed anesthesia with the patient or authorized representative who has indicated his/her understanding and acceptance.   Dental Advisory Given  Plan Discussed with: CRNA and Surgeon  Anesthesia Plan Comments:          Anesthesia Quick Evaluation

## 2014-07-24 NOTE — Care Management Note (Signed)
    Page 1 of 1   07/24/2014     1:18:01 PM CARE MANAGEMENT NOTE 07/24/2014  Patient:  Taylor [name], Taylor [name]   Account Number:  [id number]  Date Initiated:  07/24/2014  Documentation initiated by:  [facility]  Subjective/Objective Assessment:   43 Y/O M ADMITTED W/ORGANIC SEXUAL DYSFUNCTION.     Action/Plan:   FROM HOME.   Anticipated DC Date:  07/25/2014   Anticipated DC Plan:  HOME/SELF CARE      DC Planning Services  CM consult      Choice offered to / List presented to:             Status of service:  In process, will continue to follow Medicare Important Message given?   (If response is "NO", the following Medicare IM given date fields will be blank) Date Medicare IM given:   Medicare IM given by:   Date Additional Medicare IM given:   Additional Medicare IM given by:    Discharge Disposition:    Per UR Regulation:  Reviewed for med. necessity/level of care/duration of stay  If discussed at Long Length of Stay Meetings, dates discussed:    Comments:  07/24/14 Alvie Speltz RN,BSN NCM [phone] S/P IMPLANT.NO ANTICPATED D/C NEEDS.

## 2014-07-24 NOTE — Op Note (Signed)
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 Patsi Sears, MD  First assistant:  None  Anesthesia:  General LMA  Preparation:  After appropriate preanesthesia, the patient was brought the operating room, placed on the operating table in the dorsal supine position where general LMA anesthesia was introduced. The penis scrotum and suprapubic area were washed for 10 minutes with Betadine solution, then shaven, then prepped with Betadine solution, and draped in usual fashion. The armband was double checked. The history was double checked. The patient is allergic to penicillin, and he was covered with Cipro, but was further covered with clindamycin. According to the patient's father, the patient skipped his Tuesday morning dialysis, which was cleared by his dialysis physician in [place], in order to have the surgery. He is for repeat dialysis on Thursday.  Review history:  Problems  1. 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 Diabetic peripheral neuropathy (250.60,357.2)  3. Erectile dysfunction due to arterial insufficiency (607.84)  4. 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 Varicocele (456.4)  History of Present Illness  43 yo twice divorced, insulin-dependent diabetic male, with ESRD-on dialysis, returns today for a 9 mo f/u with a hx of hypogonadism & ED using PEP injections. He has noted pain with injections, lasting " all night long", without good erection.  Originally referred by Dr. [name] for further evaluation of ED. He has hasd ED for > 7 years, with slow progression of weak erection, and now with no erections. This was a source of problem with his last relationship. His current relationship is stable-with  minimal sexual activity. He would like to be more intimate. He has failed Viagra, Cialis, Levitra: all worked in the beginning, but do not work now. SHIM score=5. ( note: on Crestor).  2011-2012: Right testis infection, post Right orchiectomy (Dr. Lonna Cobb, [facility]).      Statement of  Likelihood of Success: Excellent. TIME-OUT observed.:  Procedure:  Examination of the penis and scrotum shows the patient is circumcised. There is scar tissue in the penoscrotal junction from prior surgery. The there is scar tissue in the right hemiscrotum, is felt that the patient may have had a sub-epididymal orchiectomy, or perhaps has scar tissue remaining. The patient may have had a fistula formation the past, and I elected to the excise this tissue. Therefore, a penoscrotal incision was made, measuring 5 cm, with elliptical incision of prior penoscrotal scar. Subcutaneous tissue was dissected. Foley catheter was placed without difficulty. Prostate 20 cc of straw-colored urine was drained. Foley catheter was left in place with 10 cc in the balloon.  Prior to placement of Foley catheter, a Penrose drain was placed around the penis, and sterile saline was injected in the corpora cavernosum, creating erection. No curvature the penis was noted, and regional erection was accomplished. The glans also appeared to distend. The Penrose drain was released. Remaining proximal corpora was also distended. The needle was removed.  Following skin incision, subcutaneous tissue was dissected with both sharp and blunt dissection. Care was taken to avoid any injury to the urethra. The corpora cavernosum was identified bilaterally, and dissected proximalward. 2 separate 2-0 Vicryl sutures were placed bilaterally, both medial and lateralward. Corporotomies were made bilaterally, and using the Strully scissors, dissection was accomplished with proximalward and  distalward. However, a significant amount of scarring was noted within the  corpora cavernosa, consistent with penile injection scarring. Therefore, the Asch septum forceps were used to dilate the proximal and distal corpora bilaterally. The Mentor dilators were then used to break up the scarring, and the corpora cavernosa were measured bilaterally, at 18 cm, with 10 cm distalward, and 8 cm proximalward.  A 16 cm Coloplast prosthesis with 2 semirigid extension was then placed bilaterally. A 70 cc reservoir was placed by dissecting the scar tissue above the pubis at the right angle canal, and dissecting into the right anal canal. The reservoir was placed, but when 70 cc were placed, it was felt that the reservoir could be easily palpable through the patient's skin, because of his thin abdomen. Therefore, I reduce the volume to 50 cc, and replaced the reservoir after re\re dissecting the floor. It was felt to 50 cc would be more than enough to fill his cylinders. Minimal palpation of the reservoir was then noted.  The 16 cm cylinders with 2 cm rear-tip extenders were then placed bilaterally in standard fashion using the Furlow inserter, and the [place] needle. These were inflated, with palpation showing no evidence of buckling. The corpora cavernosum were then closed under direct vision, with running 2-0 Vicryl suture.  Dissection was accomplished in the right hemiscrotum, creating a pouch for the pump, which was placed in a fresh towels without complication. 3-0 Vicryl sutures were used to secure the pouch in position.  The cylinders were inflated and excellent erection was noted. The cylinders were then deflated 5 cc of saline was left in the cylinders. The reservoir was then connected to the pump with a straight connector, and standard fashion. The tubing was then buried using 3-0 Vicryl suture. The skin was closed using 3-0 Vicryl suture creating 2 layers between the tubing and the skin. The skin was closed with 4-0 Vicryl suture. Sterile dressing was applied. The patient was  awakened and taken to recovery room in good condition.

## 2014-07-25 ENCOUNTER — Encounter (HOSPITAL_COMMUNITY): Payer: Self-pay | Admitting: Urology

## 2014-07-25 DIAGNOSIS — N529 Male erectile dysfunction, unspecified: Secondary | ICD-10-CM | POA: Diagnosis not present

## 2014-07-25 LAB — GLUCOSE, CAPILLARY
Glucose-Capillary: 90 mg/dL (ref 70–99)
Glucose-Capillary: 91 mg/dL (ref 70–99)

## 2014-07-25 MED ORDER — HYDROMORPHONE HCL 2 MG PO TABS: 2.0000 mg | ORAL_TABLET | ORAL | Status: DC | PRN

## 2014-07-25 MED ORDER — CIPROFLOXACIN HCL 500 MG PO TABS
500.0000 mg | ORAL_TABLET | Freq: Every day | ORAL | Status: DC
Start: 2014-07-25 — End: 2014-08-22

## 2014-07-25 NOTE — Discharge Summary (Signed)
Physician Discharge Summary  Patient ID: Taylor [name] MRN: [id number] DOB/AGE: 05-13-1971 43 y.o.  Admit date: 07/24/2014 Discharge date: 07/25/2014  Admission Diagnoses: erectile dysfunction  Discharge Diagnoses:  Active Problems:   Organic sexual dysfunction   Discharged Condition: stable  Hospital Course:   Inflatable penile prosthesis surgery  Consults: none  Significant Diagnostic Studies: No results found.  Treatments: IV hydration, antibiotics: cipro and percocet  Discharge Exam: Blood pressure 143/75, pulse 68, temperature 97.6 F (36.4 C), temperature source Oral, resp. rate 14, height 6\' 3"  (1.905 m), weight 82.555 kg (182 lb), SpO2 100.00%. General appearance: alert and cooperative Male genitalia: normal, abnormal findings: bruising of the scrotum.   Disposition: 06-Home-Health Care Svc  Discharge Instructions   Call MD for:  difficulty breathing, headache or visual disturbances    Complete by:  As directed      Call MD for:  persistant nausea and vomiting    Complete by:  As directed      Call MD for:  severe uncontrolled pain    Complete by:  As directed      Diet - low sodium heart healthy    Complete by:  As directed      Discharge patient    Complete by:  As directed      Discontinue IV    Complete by:  As directed      Increase activity slowly    Complete by:  As directed      No dressing needed    Complete by:  As directed             Medication List         amitriptyline 25 MG tablet  Commonly known as:  ELAVIL  Take 25 mg by mouth at bedtime.     aspirin 325 MG tablet  Take 325 mg by mouth daily with breakfast.     carvedilol 25 MG tablet  Commonly known as:  COREG  TAKE 1 BY MOUTH TWO TIMES  DAILY WITH A MEAL     cinacalcet 60 MG tablet  Commonly known as:  SENSIPAR  Take 60 mg by mouth daily at 12 noon. With biggest meal     ciprofloxacin 500 MG tablet  Commonly known as:  CIPRO  Take 1 tablet (500 mg total) by  mouth daily with breakfast.     citalopram 20 MG tablet  Commonly known as:  CELEXA  Take 20 mg by mouth every morning.     cloNIDine 0.1 MG tablet  Commonly known as:  CATAPRES  Take 0.1 mg by mouth 2 (two) times daily.     Fish Oil 1000 MG Caps  Take 1,000 mg by mouth daily.     furosemide 80 MG tablet  Commonly known as:  LASIX  Take 1 tablet (80 mg total) by mouth 2 (two) times daily.     HYDROmorphone 2 MG tablet  Commonly known as:  DILAUDID  Take 1 tablet (2 mg total) by mouth every 4 (four) hours as needed for moderate pain or severe pain (take 1/2 to 1 tablet).     insulin lispro 100 UNIT/ML injection  Commonly known as:  HUMALOG  Inject 12 Units into the skin 3 (three) times daily with meals. For every 50 over 200 add 1 unit.     isosorbide mononitrate 30 MG 24 hr tablet  Commonly known as:  IMDUR  Take 1 tablet (30 mg total) by mouth 2 (two) times daily.  LANTUS 100 UNIT/ML injection  Generic drug:  insulin glargine  36 units daily at bedtime     levothyroxine 75 MCG tablet  Commonly known as:  SYNTHROID, LEVOTHROID  Take 1 tablet (75 mcg total) by mouth daily before breakfast.     omeprazole 40 MG capsule  Commonly known as:  PRILOSEC  Take 40 mg by mouth daily.     pregabalin 150 MG capsule  Commonly known as:  LYRICA  Take 150-300 mg by mouth 2 (two) times daily.     rosuvastatin 20 MG tablet  Commonly known as:  CRESTOR  Take 1 tablet (20 mg total) by mouth at bedtime.     sevelamer carbonate 800 MG tablet  Commonly known as:  RENVELA  Take 800 mg by mouth 3 (three) times daily with meals.           Follow-up Information   Follow up with [name], MD. (per appointment)    Specialty:  Urology   Contact information:   [address] [place] [address] [phone]     Dilauded and cipro Return for appointment Return to dialysis Thursday per schedule.   SignedJethro Bolus I 07/25/2014, 8:22 AM

## 2014-07-25 NOTE — Progress Notes (Signed)
1 Day Post-Op Subjective: Patient reports feeling well. No fever, no nausea. ++ pain in scrotum. Catheter out. Dressing off.   Objective: Vital signs in last 24 hours: Temp:  [97.5 F (36.4 C)-97.8 F (36.6 C)] 97.6 F (36.4 C) (09/16 0554) Pulse Rate:  [62-68] 68 (09/16 0554) Resp:  [8-14] 14 (09/16 0554) BP: (100-143)/(51-76) 143/75 mmHg (09/16 0554) SpO2:  [92 %-100 %] 100 % (09/16 0554)  Intake/Output from previous day: 09/15 0701 - 09/16 0700 In: 940.5 [P.O.:360; I.V.:580.5] Out: 111 [Urine:111] Intake/Output this shift:    Physical Exam:  General:cooperative GI: not done and soft, non tender, normal bowel sounds, no palpable masses, no organomegaly, no inguinal hernia GU: bruising of scrotum. Scrotal swelling. Penile swelling .    Lab Results:  Recent Labs  07/24/14 0652 07/24/14 1520  HGB 11.4* 10.9*  HCT 35.5* 34.7*   BMET  Recent Labs  07/24/14 0652 07/24/14 1520  NA 134*  --   K 5.8*  --   CL 92*  --   CO2 26  --   GLUCOSE 168*  --   BUN 51*  --   CREATININE 6.14* 6.30*  CALCIUM 9.4  --    No results found for this basename: LABPT, INR,  in the last 72 hours No results found for this basename: LABURIN,  in the last 72 hours Results for orders placed during the hospital encounter of 02/12/12  SURGICAL PCR SCREEN     Status: Abnormal   Collection Time    02/12/12  3:09 PM      Result Value Ref Range Status   MRSA, PCR NEGATIVE  NEGATIVE Final   Staphylococcus aureus POSITIVE (*) NEGATIVE Final   Comment:            The Xpert SA Assay (FDA     approved for NASAL specimens     only), is one component of     a comprehensive surveillance     program.  It is not intended     to diagnose infection nor to     guide or monitor treatment.    Studies/Results: No results found.  Assessment/Plan: Excellent post op course. Wuill d/c today on cipro and dilaudid.  F/u for dialysis tomorrow in [place].  heatr to scrotum and recliner chair.    LOS: 1 day   Nickie Deren I 07/25/2014, 8:12 AM

## 2014-07-25 NOTE — Progress Notes (Signed)
Patient able to void in urinal prior to discharge. Will discharge per MD order. Setzer, Don Broach

## 2014-07-25 NOTE — Discharge Instructions (Signed)
HOME CARE INSTRUCTIONS FOR SCROTAL PROCEDURES  Wound Care & Hygiene: You may apply an ice bag to the scrotum for the first 24 hours.  This may help decrease swelling and soreness.  You may have a dressing held in place by an athletic supporter.  You may remove the dressing in 24 hours and shower in 48 hours.  Continue to use the athletic supporter or tight briefs for at least a week. Activity: Rest today - not necessarily flat bed rest.  Just take it easy.  You should not do strenuous activities until your follow-up visit with your doctor.  You may resume light activity in 48 hours.  Return to Work:  Your doctor will advise you of this depending on the type of work you do  Diet: Drink liquids or eat a light diet this evening.  You may resume a regular diet tomorrow.  General Expectations: You may have a small amount of bleeding.  The scrotum may be swollen or bruised for about a week.  Call your Doctor if these occur:  -persistent or heavy bleeding  -temperature of 101 degrees or more  -severe pain, not relieved by your pain medication  Return to Doctor's Office:  Call to set up and appointment.  Patient Signature:  __________________________________________________  Nurse's Signature:  __________________________________________________  

## 2014-08-06 ENCOUNTER — Ambulatory Visit: Payer: Medicare Other | Admitting: Internal Medicine

## 2014-08-06 ENCOUNTER — Telehealth: Payer: Self-pay | Admitting: Internal Medicine

## 2014-08-06 DIAGNOSIS — Z0289 Encounter for other administrative examinations: Secondary | ICD-10-CM

## 2014-08-06 NOTE — Telephone Encounter (Signed)
Patient no showed today's appt. Please advise on how to follow up. °A. No follow up necessary. °B. Follow up urgent. Contact patient immediately. °C. Follow up necessary. Contact patient and schedule visit in ___ days. °D. Follow up advised. Contact patient and schedule visit in ____weeks. ° °

## 2014-08-06 NOTE — Telephone Encounter (Signed)
Within 1 mo 

## 2014-08-16 NOTE — Telephone Encounter (Signed)
LM for pt to call back to reschedule the appt

## 2014-08-21 ENCOUNTER — Other Ambulatory Visit: Payer: Self-pay | Admitting: *Deleted

## 2014-08-21 MED ORDER — AMITRIPTYLINE HCL 25 MG PO TABS: 25.0000 mg | ORAL_TABLET | Freq: Every day | ORAL | Status: DC

## 2014-08-22 ENCOUNTER — Encounter (HOSPITAL_COMMUNITY): Payer: Self-pay | Admitting: Emergency Medicine

## 2014-08-22 ENCOUNTER — Inpatient Hospital Stay (HOSPITAL_COMMUNITY): Payer: Medicare Other | Admitting: Anesthesiology

## 2014-08-22 ENCOUNTER — Inpatient Hospital Stay (HOSPITAL_COMMUNITY)
Admission: EM | Admit: 2014-08-22 | Discharge: 2014-08-27 | DRG: 673 | Disposition: A | Discharge status: Home Health | Payer: Medicare Other | Attending: Internal Medicine | Admitting: Internal Medicine

## 2014-08-22 ENCOUNTER — Encounter (HOSPITAL_COMMUNITY): Payer: Medicare Other | Admitting: Anesthesiology

## 2014-08-22 ENCOUNTER — Inpatient Hospital Stay (HOSPITAL_COMMUNITY): Payer: Medicare Other

## 2014-08-22 ENCOUNTER — Encounter (HOSPITAL_COMMUNITY)
Admission: EM | Disposition: A | Discharge status: Home Health | Payer: Self-pay | Source: Home / Self Care | Attending: Internal Medicine

## 2014-08-22 DIAGNOSIS — R739 Hyperglycemia, unspecified: Secondary | ICD-10-CM

## 2014-08-22 DIAGNOSIS — R197 Diarrhea, unspecified: Secondary | ICD-10-CM | POA: Diagnosis not present

## 2014-08-22 DIAGNOSIS — I2510 Atherosclerotic heart disease of native coronary artery without angina pectoris: Secondary | ICD-10-CM | POA: Diagnosis present

## 2014-08-22 DIAGNOSIS — E1022 Type 1 diabetes mellitus with diabetic chronic kidney disease: Secondary | ICD-10-CM

## 2014-08-22 DIAGNOSIS — Z89421 Acquired absence of other right toe(s): Secondary | ICD-10-CM

## 2014-08-22 DIAGNOSIS — E871 Hypo-osmolality and hyponatremia: Secondary | ICD-10-CM | POA: Diagnosis present

## 2014-08-22 DIAGNOSIS — M069 Rheumatoid arthritis, unspecified: Secondary | ICD-10-CM | POA: Diagnosis present

## 2014-08-22 DIAGNOSIS — N186 End stage renal disease: Secondary | ICD-10-CM | POA: Diagnosis present

## 2014-08-22 DIAGNOSIS — R112 Nausea with vomiting, unspecified: Secondary | ICD-10-CM

## 2014-08-22 DIAGNOSIS — N2581 Secondary hyperparathyroidism of renal origin: Secondary | ICD-10-CM | POA: Diagnosis present

## 2014-08-22 DIAGNOSIS — E1143 Type 2 diabetes mellitus with diabetic autonomic (poly)neuropathy: Secondary | ICD-10-CM

## 2014-08-22 DIAGNOSIS — I120 Hypertensive chronic kidney disease with stage 5 chronic kidney disease or end stage renal disease: Secondary | ICD-10-CM | POA: Diagnosis present

## 2014-08-22 DIAGNOSIS — D649 Anemia, unspecified: Secondary | ICD-10-CM | POA: Diagnosis present

## 2014-08-22 DIAGNOSIS — T8140XA Infection following a procedure, unspecified, initial encounter: Secondary | ICD-10-CM | POA: Diagnosis present

## 2014-08-22 DIAGNOSIS — K219 Gastro-esophageal reflux disease without esophagitis: Secondary | ICD-10-CM | POA: Diagnosis present

## 2014-08-22 DIAGNOSIS — Z960 Presence of urogenital implants: Secondary | ICD-10-CM | POA: Diagnosis present

## 2014-08-22 DIAGNOSIS — Z0000 Encounter for general adult medical examination without abnormal findings: Secondary | ICD-10-CM

## 2014-08-22 DIAGNOSIS — I252 Old myocardial infarction: Secondary | ICD-10-CM | POA: Diagnosis not present

## 2014-08-22 DIAGNOSIS — I739 Peripheral vascular disease, unspecified: Secondary | ICD-10-CM | POA: Diagnosis present

## 2014-08-22 DIAGNOSIS — R0989 Other specified symptoms and signs involving the circulatory and respiratory systems: Secondary | ICD-10-CM

## 2014-08-22 DIAGNOSIS — Z7189 Other specified counseling: Secondary | ICD-10-CM

## 2014-08-22 DIAGNOSIS — L723 Sebaceous cyst: Secondary | ICD-10-CM

## 2014-08-22 DIAGNOSIS — L03314 Cellulitis of groin: Secondary | ICD-10-CM | POA: Diagnosis present

## 2014-08-22 DIAGNOSIS — F329 Major depressive disorder, single episode, unspecified: Secondary | ICD-10-CM | POA: Diagnosis present

## 2014-08-22 DIAGNOSIS — E1065 Type 1 diabetes mellitus with hyperglycemia: Secondary | ICD-10-CM | POA: Diagnosis present

## 2014-08-22 DIAGNOSIS — E039 Hypothyroidism, unspecified: Secondary | ICD-10-CM | POA: Diagnosis present

## 2014-08-22 DIAGNOSIS — E10621 Type 1 diabetes mellitus with foot ulcer: Secondary | ICD-10-CM

## 2014-08-22 DIAGNOSIS — IMO0002 Reserved for concepts with insufficient information to code with codable children: Secondary | ICD-10-CM

## 2014-08-22 DIAGNOSIS — L97509 Non-pressure chronic ulcer of other part of unspecified foot with unspecified severity: Secondary | ICD-10-CM

## 2014-08-22 DIAGNOSIS — Z794 Long term (current) use of insulin: Secondary | ICD-10-CM | POA: Diagnosis not present

## 2014-08-22 DIAGNOSIS — E1165 Type 2 diabetes mellitus with hyperglycemia: Secondary | ICD-10-CM

## 2014-08-22 DIAGNOSIS — F410 Panic disorder [episodic paroxysmal anxiety] without agoraphobia: Secondary | ICD-10-CM

## 2014-08-22 DIAGNOSIS — F419 Anxiety disorder, unspecified: Secondary | ICD-10-CM | POA: Diagnosis present

## 2014-08-22 DIAGNOSIS — T836XXA Infection and inflammatory reaction due to prosthetic device, implant and graft in genital tract, initial encounter: Principal | ICD-10-CM | POA: Diagnosis present

## 2014-08-22 DIAGNOSIS — Z992 Dependence on renal dialysis: Secondary | ICD-10-CM | POA: Diagnosis not present

## 2014-08-22 DIAGNOSIS — Y831 Surgical operation with implant of artificial internal device as the cause of abnormal reaction of the patient, or of later complication, without mention of misadventure at the time of the procedure: Secondary | ICD-10-CM | POA: Diagnosis present

## 2014-08-22 DIAGNOSIS — L0390 Cellulitis, unspecified: Secondary | ICD-10-CM | POA: Diagnosis present

## 2014-08-22 DIAGNOSIS — I071 Rheumatic tricuspid insufficiency: Secondary | ICD-10-CM

## 2014-08-22 DIAGNOSIS — E1100 Type 2 diabetes mellitus with hyperosmolarity without nonketotic hyperglycemic-hyperosmolar coma (NKHHC): Secondary | ICD-10-CM

## 2014-08-22 DIAGNOSIS — I2729 Other secondary pulmonary hypertension: Secondary | ICD-10-CM

## 2014-08-22 DIAGNOSIS — B86 Scabies: Secondary | ICD-10-CM

## 2014-08-22 DIAGNOSIS — E7251 Non-ketotic hyperglycinemia: Secondary | ICD-10-CM

## 2014-08-22 DIAGNOSIS — E785 Hyperlipidemia, unspecified: Secondary | ICD-10-CM | POA: Diagnosis present

## 2014-08-22 DIAGNOSIS — N189 Chronic kidney disease, unspecified: Secondary | ICD-10-CM

## 2014-08-22 DIAGNOSIS — Z7982 Long term (current) use of aspirin: Secondary | ICD-10-CM

## 2014-08-22 DIAGNOSIS — N492 Inflammatory disorders of scrotum: Secondary | ICD-10-CM

## 2014-08-22 DIAGNOSIS — R0602 Shortness of breath: Secondary | ICD-10-CM

## 2014-08-22 DIAGNOSIS — Z951 Presence of aortocoronary bypass graft: Secondary | ICD-10-CM

## 2014-08-22 DIAGNOSIS — R37 Sexual dysfunction, unspecified: Secondary | ICD-10-CM

## 2014-08-22 DIAGNOSIS — T8361XA Infection and inflammatory reaction due to implanted penile prosthesis, initial encounter: Secondary | ICD-10-CM

## 2014-08-22 DIAGNOSIS — E1142 Type 2 diabetes mellitus with diabetic polyneuropathy: Secondary | ICD-10-CM

## 2014-08-22 DIAGNOSIS — F32A Depression, unspecified: Secondary | ICD-10-CM

## 2014-08-22 DIAGNOSIS — L0291 Cutaneous abscess, unspecified: Secondary | ICD-10-CM

## 2014-08-22 HISTORY — PX: REMOVAL OF PENILE PROSTHESIS: SHX6059

## 2014-08-22 LAB — URINALYSIS, ROUTINE W REFLEX MICROSCOPIC
Bilirubin Urine: NEGATIVE
Ketones, ur: 15 mg/dL — AB
LEUKOCYTES UA: NEGATIVE
Nitrite: NEGATIVE
Protein, ur: 100 mg/dL — AB
SPECIFIC GRAVITY, URINE: 1.019 (ref 1.005–1.030)
Urobilinogen, UA: 0.2 mg/dL (ref 0.0–1.0)
pH: 7.5 (ref 5.0–8.0)

## 2014-08-22 LAB — CBC WITH DIFFERENTIAL/PLATELET
Basophils Absolute: 0 10*3/uL (ref 0.0–0.1)
Basophils Relative: 0 % (ref 0–1)
Eosinophils Absolute: 0.1 10*3/uL (ref 0.0–0.7)
Eosinophils Relative: 1 % (ref 0–5)
HCT: 31.7 % — ABNORMAL LOW (ref 39.0–52.0)
HEMOGLOBIN: 9.6 g/dL — AB (ref 13.0–17.0)
Lymphocytes Relative: 6 % — ABNORMAL LOW (ref 12–46)
Lymphs Abs: 0.7 10*3/uL (ref 0.7–4.0)
MCH: 27.8 pg (ref 26.0–34.0)
MCHC: 30.3 g/dL (ref 30.0–36.0)
MCV: 91.9 fL (ref 78.0–100.0)
Monocytes Absolute: 0.5 10*3/uL (ref 0.1–1.0)
Monocytes Relative: 4 % (ref 3–12)
NEUTROS PCT: 89 % — AB (ref 43–77)
Neutro Abs: 9 10*3/uL — ABNORMAL HIGH (ref 1.7–7.7)
PLATELETS: 167 10*3/uL (ref 150–400)
RBC: 3.45 MIL/uL — AB (ref 4.22–5.81)
RDW: 16.2 % — ABNORMAL HIGH (ref 11.5–15.5)
WBC: 10.3 10*3/uL (ref 4.0–10.5)

## 2014-08-22 LAB — I-STAT CG4 LACTIC ACID, ED: Lactic Acid, Venous: 1.44 mmol/L (ref 0.5–2.2)

## 2014-08-22 LAB — COMPREHENSIVE METABOLIC PANEL
ALBUMIN: 2.8 g/dL — AB (ref 3.5–5.2)
ALK PHOS: 432 U/L — AB (ref 39–117)
ALT: 10 U/L (ref 0–53)
AST: 11 U/L (ref 0–37)
Anion gap: 21 — ABNORMAL HIGH (ref 5–15)
BUN: 36 mg/dL — ABNORMAL HIGH (ref 6–23)
CO2: 22 mEq/L (ref 19–32)
Calcium: 9.2 mg/dL (ref 8.4–10.5)
Chloride: 83 mEq/L — ABNORMAL LOW (ref 96–112)
Creatinine, Ser: 4.24 mg/dL — ABNORMAL HIGH (ref 0.50–1.35)
GFR calc Af Amer: 18 mL/min — ABNORMAL LOW (ref >90)
GFR calc non Af Amer: 16 mL/min — ABNORMAL LOW (ref >90)
Glucose, Bld: 583 mg/dL (ref 70–99)
POTASSIUM: 4.5 meq/L (ref 3.7–5.3)
SODIUM: 126 meq/L — AB (ref 137–147)
TOTAL PROTEIN: 7.9 g/dL (ref 6.0–8.3)
Total Bilirubin: 0.5 mg/dL (ref 0.3–1.2)

## 2014-08-22 LAB — CBG MONITORING, ED
GLUCOSE-CAPILLARY: 575 mg/dL — AB (ref 70–99)
GLUCOSE-CAPILLARY: 562 mg/dL — AB (ref 70–99)
GLUCOSE-CAPILLARY: 393 mg/dL — AB (ref 70–99)
Glucose-Capillary: 289 mg/dL — ABNORMAL HIGH (ref 70–99)

## 2014-08-22 LAB — BLOOD GAS, VENOUS
ACID-BASE DEFICIT: 1.9 mmol/L (ref 0.0–2.0)
Bicarbonate: 23.8 mEq/L (ref 20.0–24.0)
FIO2: 0.21 %
O2 SAT: 22.8 %
PATIENT TEMPERATURE: 98.6
PCO2 VEN: 46.7 mmHg (ref 45.0–50.0)
PH VEN: 7.327 — AB (ref 7.250–7.300)
TCO2: 22.4 mmol/L (ref 0–100)

## 2014-08-22 LAB — URINE MICROSCOPIC-ADD ON

## 2014-08-22 SURGERY — REMOVAL, PENILE PROSTHESIS
Anesthesia: General

## 2014-08-22 MED ORDER — VANCOMYCIN HCL IN DEXTROSE 1-5 GM/200ML-% IV SOLN: 1000.0000 mg | Freq: Once | INTRAVENOUS | Status: DC

## 2014-08-22 MED ORDER — FENTANYL CITRATE 0.05 MG/ML IJ SOLN
INTRAMUSCULAR | Status: DC | PRN
  Administered 2014-08-22 – 2014-08-23 (×8): 25 ug via INTRAVENOUS

## 2014-08-22 MED ORDER — ONDANSETRON HCL 4 MG/2ML IJ SOLN
INTRAMUSCULAR | Status: DC | PRN
  Administered 2014-08-22: 4 mg via INTRAVENOUS

## 2014-08-22 MED ORDER — SODIUM CHLORIDE 0.9 % IR SOLN
Status: AC
  Filled 2014-08-22: qty 1

## 2014-08-22 MED ORDER — SODIUM CHLORIDE 0.9 % IR SOLN
Status: DC | PRN
  Administered 2014-08-22

## 2014-08-22 MED ORDER — PROPOFOL 10 MG/ML IV BOLUS
INTRAVENOUS | Status: AC
  Filled 2014-08-22: qty 20

## 2014-08-22 MED ORDER — SUCCINYLCHOLINE CHLORIDE 20 MG/ML IJ SOLN
INTRAMUSCULAR | Status: DC | PRN
  Administered 2014-08-22: 100 mg via INTRAVENOUS

## 2014-08-22 MED ORDER — FENTANYL CITRATE 0.05 MG/ML IJ SOLN
INTRAMUSCULAR | Status: AC
  Filled 2014-08-22: qty 2

## 2014-08-22 MED ORDER — PROPOFOL 10 MG/ML IV BOLUS
INTRAVENOUS | Status: DC | PRN
  Administered 2014-08-22: 150 mg via INTRAVENOUS
  Administered 2014-08-22: 20 mg via INTRAVENOUS

## 2014-08-22 MED ORDER — VANCOMYCIN HCL 10 G IV SOLR
1500.0000 mg | INTRAVENOUS | Status: AC
  Administered 2014-08-22: 1500 mg via INTRAVENOUS
  Filled 2014-08-22: qty 1500

## 2014-08-22 MED ORDER — SODIUM CHLORIDE 0.9 % IV SOLN
INTRAVENOUS | Status: DC
  Administered 2014-08-22: 5.2 [IU]/h via INTRAVENOUS
  Filled 2014-08-22: qty 2.5

## 2014-08-22 MED ORDER — ONDANSETRON HCL 4 MG/2ML IJ SOLN
INTRAMUSCULAR | Status: AC
  Filled 2014-08-22: qty 2

## 2014-08-22 MED ORDER — SODIUM CHLORIDE 0.9 % IV SOLN
INTRAVENOUS | Status: DC | PRN
  Administered 2014-08-22: 23:00:00 via INTRAVENOUS

## 2014-08-22 MED ORDER — SODIUM CHLORIDE 0.9 % IV SOLN: 250.0000 [IU] | INTRAVENOUS | Status: DC | PRN

## 2014-08-22 MED ORDER — HYDROMORPHONE HCL 2 MG PO TABS
2.0000 mg | ORAL_TABLET | ORAL | Status: DC | PRN
  Administered 2014-08-22 – 2014-08-23 (×3): 2 mg via ORAL
  Filled 2014-08-22 (×3): qty 1

## 2014-08-22 MED ORDER — MIDAZOLAM HCL 2 MG/2ML IJ SOLN
INTRAMUSCULAR | Status: AC
  Filled 2014-08-22: qty 2

## 2014-08-22 MED ORDER — CLINDAMYCIN PHOSPHATE 600 MG/50ML IV SOLN
600.0000 mg | Freq: Three times a day (TID) | INTRAVENOUS | Status: DC
  Administered 2014-08-22 – 2014-08-23 (×2): 600 mg via INTRAVENOUS
  Filled 2014-08-22 (×5): qty 50

## 2014-08-22 SURGICAL SUPPLY — 50 items
APL SKNCLS STERI-STRIP NONHPOA (GAUZE/BANDAGES/DRESSINGS)
BAG URINE DRAINAGE (UROLOGICAL SUPPLIES) ×1 IMPLANT
BANDAGE COBAN STERILE 2 (GAUZE/BANDAGES/DRESSINGS) IMPLANT
BENZOIN TINCTURE PRP APPL 2/3 (GAUZE/BANDAGES/DRESSINGS) ×1 IMPLANT
BLADE HEX COATED 2.75 (ELECTRODE) ×3 IMPLANT
BLADE SURG 15 STRL LF DISP TIS (BLADE) ×2 IMPLANT
BLADE SURG 15 STRL SS (BLADE) ×6
BNDG GAUZE ELAST 4 BULKY (GAUZE/BANDAGES/DRESSINGS) ×2 IMPLANT
CANISTER SUCTION 2500CC (MISCELLANEOUS) ×1 IMPLANT
CATH FOLEY 2WAY SLVR  5CC 16FR (CATHETERS)
CATH FOLEY 2WAY SLVR 5CC 16FR (CATHETERS) ×1 IMPLANT
CLOSURE WOUND 1/2 X4 (GAUZE/BANDAGES/DRESSINGS)
COVER MAYO STAND STRL (DRAPES) ×1 IMPLANT
COVER SURGICAL LIGHT HANDLE (MISCELLANEOUS) ×3 IMPLANT
DISSECTOR ROUND CHERRY 3/8 STR (MISCELLANEOUS) ×1 IMPLANT
DRAIN PENROSE 18X1/4 LTX STRL (WOUND CARE) ×4 IMPLANT
DRAPE LAPAROTOMY T 102X78X121 (DRAPES) ×3 IMPLANT
DRSG TEGADERM 4X4.75 (GAUZE/BANDAGES/DRESSINGS) ×1 IMPLANT
ELECT REM PT RETURN 9FT ADLT (ELECTROSURGICAL) ×3
ELECTRODE REM PT RTRN 9FT ADLT (ELECTROSURGICAL) ×1 IMPLANT
GAUZE SPONGE 4X4 12PLY STRL (GAUZE/BANDAGES/DRESSINGS) ×2 IMPLANT
GLOVE BIOGEL M 8.0 STRL (GLOVE) ×3 IMPLANT
GOWN STRL REUS W/TWL XL LVL3 (GOWN DISPOSABLE) ×3 IMPLANT
KIT BASIN OR (CUSTOM PROCEDURE TRAY) ×3 IMPLANT
NS IRRIG 1000ML POUR BTL (IV SOLUTION) ×3 IMPLANT
PACK GENERAL/GYN (CUSTOM PROCEDURE TRAY) ×3 IMPLANT
PLUG CATH AND CAP STER (CATHETERS) ×3 IMPLANT
RETRACTOR WILSON SYSTEM (INSTRUMENTS) IMPLANT
SOL PREP POV-IOD 4OZ 10% (MISCELLANEOUS) ×3 IMPLANT
SOL PREP PROV IODINE SCRUB 4OZ (MISCELLANEOUS) ×1 IMPLANT
SPONGE LAP 4X18 X RAY DECT (DISPOSABLE) IMPLANT
STRIP CLOSURE SKIN 1/2X4 (GAUZE/BANDAGES/DRESSINGS) ×1 IMPLANT
SUPPORT SCROTAL LG STRP (MISCELLANEOUS) ×1 IMPLANT
SUPPORT SCROTAL MED ADLT STRP (MISCELLANEOUS) ×1 IMPLANT
SUPPORTER ATHLETIC LG (MISCELLANEOUS)
SUPPORTER ATHLETIC MED (MISCELLANEOUS) ×1
SURGILUBE 3G PEEL PACK STRL (MISCELLANEOUS) ×5 IMPLANT
SUT CHROMIC 3 0 SH 27 (SUTURE) ×1 IMPLANT
SUT ETHILON 2 0 PS N (SUTURE) ×4 IMPLANT
SUT ETHILON 3 0 PS 1 (SUTURE) ×2 IMPLANT
SUT MNCRL AB 4-0 PS2 18 (SUTURE) ×1 IMPLANT
SUT PDS AB 2-0 CT2 27 (SUTURE) ×4 IMPLANT
SUT VIC AB 2-0 SH 27 (SUTURE) ×3
SUT VIC AB 2-0 SH 27X BRD (SUTURE) IMPLANT
SUT VIC AB 2-0 UR6 27 (SUTURE) ×2 IMPLANT
SYR 20CC LL (SYRINGE) ×1 IMPLANT
SYR 50ML LL SCALE MARK (SYRINGE) ×2 IMPLANT
TOWEL OR 17X26 10 PK STRL BLUE (TOWEL DISPOSABLE) ×3 IMPLANT
TRAY FOLEY BAG SILVER LF 16FR (CATHETERS) ×2 IMPLANT
WATER STERILE IRR 1500ML POUR (IV SOLUTION) ×1 IMPLANT

## 2014-08-22 NOTE — Op Note (Signed)
Preoperative diagnosis: Infected penile prosthesis  Postoperative diagnosis: Same   Procedure: Explant of prosthesis    Surgeon: Bertram Millard. Montay Vanvoorhis, M.D.   Anesthesia: Gen.   Complications: None  Specimen(s): 3 piece penile prosthesis, aerobic and anaerobic cultures of scrotal pus  Drain(s): 1 drain, quarter-inch Penrose, from the inguinal incision down to this. Scrotum through the tubing tract, 1/4 inch Penrose drain with its distal tip in the proximal penile corporal body, one quarter-inch Penrose drain towards the right corporal body, through the pump cavity, and through the skin.  Indications: 43 year old male who is diabetic and on chronic hemodialysis for end-stage renal disease, one month out from placement of a Coloplast 3 piece inflatable penile prosthesis. He has had recent increasing pain in his genitalia, as well as some drainage from his scrotal incision. He presented to the emergency room tonight with said complaints, having been seen in the office by Dr. Patsi Sears today. CT of the pelvis revealed gas along his penile prosthesis, with other intra-abdominal findings most likely unrelated to his prosthesis. Exam revealed brawny edema of his genitalia, as as well as a nonhealing scrotal wound. It was recommended, with his above findings, and his diabetes and chronic disease, that he have this prosthesis removed tonight. He presents at that procedure.  Findings: There was significant pus around the implant pump, reservoir and cylinders. No evidence of devitalized tissue. There was a significant eschar on the patient's distal penis consistent with small infarcts/necrosis. Cultures were taken of the purulent collection.  Technique: The patient was properly identified in the holding area. He was taken the operating room where general anesthetic was administered. He was placed in the recumbent position on a table. Genitalia, perineum and lower abdomen were prepped and draped. Proper  timeout was performed. A 16 French Foley with catheter was placed after the eschar was debrided from the glans penis. This was somewhat difficult to place, as there was compression from the cylinders. The urine obtained from the bladder was clear.  I first opened the penoscrotal incision with the Bovie. Upon entering the scrotum in the midline, there was significant pus drained, first from the lower scrotum around the pump, and then tracking up bilaterally to the cylinders. Most of the dissection at this point was actually done bluntly. Once most of the pus was drained and the dissection of the inferior ly placed pump was performed, the pump was removed. The cavity where the pump was was irrigated with triple antibiotic irrigation. The pump was removed after the pump tubing was cut. By dissecting bilaterally along the cylinder tubing, I can actually feel the cylinders, as I am quite sure that the corporotomies had opened up spontaneously. I then made a 3 cm incision in the right inguinal region, over what I thought was the prosthesis reservoir. This was carried down through subcutaneous tissue, and down through the external oblique fascia with electrocautery. Careful dissection took me down right onto the  Pump cylinder.this was somewhat hard to grasp. However, after the reservoir tubing was cut within the scrotum, I was able to negotiate the reservoir out of the cavity. There was a significant amount of pus around this tube. Again, this was copiously irrigated with the antibiotic solution. At this point, dissection was then carried down to the prosthesis cylinders bilaterally. I was able to gently pull on the tubing, and the cylinders as well as a 2 cm rear-tip extenders were delivered from the corporal bodies bilaterally. I then copiously irrigated both proximally and distally  with the antibiotic irrigation. Careful inspection of the dissected area within the scrotum revealed no evident necrotic tissue. I did  not think there was anything devitalized that needed to be resected. I then placed drains as follows: The first drain was brought up through the inguinal incision, down through the tubing tract, and into the upper scrotum. A second drain was placed down towards the left corporal body. I did my best to place the distal end of the drain in the left corporal body towards the penis. A third drain was placed towards the right corporal body, down in the pump cavity, and out to the skin. I then closed the inguinal incision loosely. 3 interrupted 2-0 Vicryl sutures were placed in the external oblique fascia. The subcutaneous tissue was gently reapproximated using the same 2-0 Vicryl, again placed in a simple interrupted fashion. 2-0 nylon was then used to gently reapproximate the skin edges in the inguinal region using vertical mattress fashion sutures. I did not closing the subcutaneous tissue within the scrotum. The skin edges were reapproximated, again using 2-0 nylon sutures, placed in a vertical mattress fashion. The drains were sutured in position with 3-0 nylon. A bulky gauze dressing was placed over the scrotal wound and over the inguinal wound. These were held in place with a n athletic supporter. The patient was then awakened and taken to the PACU in stable condition.  He will be transported to the renal unit at [facility] for further care.

## 2014-08-22 NOTE — ED Provider Notes (Signed)
CSN: [id number]     Arrival date & time 08/22/14  1548 History   First MD Initiated Contact with Patient 08/22/14 1702     Chief Complaint  Patient presents with  . sent from PCP for infection     HPI Patient presents to the emergency room for hospitalization.  The patient has multiple medical problems including diabetes and end-stage renal disease requiring dialysis. Patient had a penile prosthesis implant surgery performed on 07/24/2014.   Over the last week patient has noticed increasing pain and swelling in the general region. He has also noticed drainage. Patient's blood sugars have also been running high. He went to see Dr. Patsi Sears today who was concerned about a wound infection. He called the ED indicating that the patient should be admitted to the hospital. Because of his multiple medical issues the medical service will need to be consulted for admission.  Patient denies any fevers at home. Has not had any chest pain or shortness of breath. No vomiting or diarrhea. Past Medical History  Diagnosis Date  . End stage renal disease on dialysis     LUE fistula  . Type I diabetes mellitus     a. 03/2014 admitted with HNK to [facility].  . Diabetic neuropathy     severe, s/p multiple toe amputation  . Hypothyroidism   . Hypertension   . Hyperlipidemia   . H/O hiatal hernia   . GERD (gastroesophageal reflux disease)   . Anxiety   . Sebaceous cyst     side of neck  . Pneumonia     2010  . Anemia     a. req PRBC's 2011.  [name] PAD (peripheral artery disease)     a. s/p amputation of toes on the right;  b. left LE claudication.  . Coronary artery disease     a. s/p MI;  b. 10/2009 CABG x 3 @ [facility]: LIMA->LAD, VG->OM3, VG->RPDA; c. 11/2010 Cath 3/3 patent grafts;  d 12/2012 Cath: LM 30d, LAD 85p, D1 70, D2 90, LCX 40ost, OM2 100, RCA 90p, 128m, L->LAD ok, VG->OM3 ok, VG->RPDA 30, EF 50%->Med Rx.  . Cataract     right  . Valvular disease     a. 11/2012 Echo: EF 55-60%, mild LVH, mild MR, mild  bi-atrial enlargement, mild-mod TR, PASP .  . Myocardial infarction   . CHF (congestive heart failure)   . Depression   . Arthritis     rheumatoid arthritis   . Hx of transfusion of packed red blood cells    Past Surgical History  Procedure Laterality Date  . Dialysis fistula creation      left upper arm fistula  . Amputation      TOES ON BOTH FEET  . Abscess drainage  Behind right ear/occipital scalp  . Skin graft    . Eye surgery  1999  . Coronary artery bypass graft  10/2009    [facility] (Dr. Katrinka Blazing)  . Foot amputation    . Cardiac catheterization    . Cardiac catheterization  2/14    [facility]: severe 3 vessel CAD with patent grafts, RHC: moderately elevated PCW and pulmonary hypertension  . Penile prosthesis implant N/A 07/24/2014    Procedure: SALINE PENILE INJECTION WITH DISSECTION OF CORPORA ,  IMPLANTATION OF COLOPLAST PENILE PROTHESIS INFLATABLE;  Surgeon: [name], MD;  Location: [facility] ORS;  Service: Urology;  Laterality: N/A;   Family History  Problem Relation Age of Onset  . Heart disease Other   . Hypertension  Other   . Hypertension Mother   . Heart disease Mother   . Diabetes Mother   . Birth defects Paternal Uncle     unaware  . Birth defects Paternal Grandmother     breast   History  Substance Use Topics  . Smoking status: Never Smoker   . Smokeless tobacco: Former Neurosurgeon  . Alcohol Use: 0.0 oz/week     Comment: occasional beer    Review of Systems  All other systems reviewed and are negative.     Allergies  Amoxicillin-pot clavulanate; Hydromorphone hcl; and Rifampin  Home Medications   Prior to Admission medications   Medication Sig Start Date End Date Taking? Authorizing Provider  acetaminophen (TYLENOL) 500 MG tablet Take 1,000 mg by mouth every 6 (six) hours as needed (pain).   Yes Historical Provider, MD  amitriptyline (ELAVIL) 25 MG tablet Take 1 tablet (25 mg total) by mouth at bedtime. 08/21/14  Yes Dianne Dun, MD  aspirin 325  MG tablet Take 325 mg by mouth daily with breakfast.    Yes Historical Provider, MD  carvedilol (COREG) 25 MG tablet Take 25 mg by mouth 2 (two) times daily with a meal.   Yes Historical Provider, MD  cinacalcet (SENSIPAR) 60 MG tablet Take 60 mg by mouth at bedtime. With biggest meal 02/16/11  Yes Dianne Dun, MD  citalopram (CELEXA) 20 MG tablet Take 20 mg by mouth every morning.   Yes Historical Provider, MD  cloNIDine (CATAPRES) 0.1 MG tablet Take 0.1 mg by mouth 2 (two) times daily.   Yes Historical Provider, MD  furosemide (LASIX) 80 MG tablet Take 1 tablet (80 mg total) by mouth 2 (two) times daily. 08/01/12  Yes Dianne Dun, MD  HYDROmorphone (DILAUDID) 2 MG tablet Take 2 mg by mouth every 4 (four) hours as needed for severe pain (pain).   Yes Historical Provider, MD  insulin glargine (LANTUS) 100 UNIT/ML injection Inject 36 units into the skin daily at bedtime 12/06/13  Yes Dianne Dun, MD  insulin lispro (HUMALOG) 100 UNIT/ML injection Inject 12 Units into the skin 3 (three) times daily with meals. For every 50 over 200 add 1 unit. 04/26/14  Yes Carlus Pavlov, MD  isosorbide mononitrate (IMDUR) 30 MG 24 hr tablet Take 1 tablet (30 mg total) by mouth 2 (two) times daily. 02/11/11  Yes Antonieta Iba, MD  levothyroxine (SYNTHROID, LEVOTHROID) 75 MCG tablet Take 1 tablet (75 mcg total) by mouth daily before breakfast. 07/26/13  Yes Dianne Dun, MD  Omega-3 Fatty Acids (FISH OIL) 1000 MG CAPS Take 1,000 mg by mouth daily.    Yes Historical Provider, MD  omeprazole (PRILOSEC) 40 MG capsule Take 40 mg by mouth daily.   Yes Historical Provider, MD  pregabalin (LYRICA) 150 MG capsule Take 150-300 mg by mouth 2 (two) times daily.   Yes Historical Provider, MD  rosuvastatin (CRESTOR) 20 MG tablet Take 1 tablet (20 mg total) by mouth at bedtime. 12/13/12  Yes Dianne Dun, MD  sevelamer carbonate (RENVELA) 800 MG tablet Take 800 mg by mouth 3 (three) times daily with meals.   Yes Historical Provider, MD    BP 182/84  Pulse 88  Temp(Src) 98.1 F (36.7 C) (Oral)  Resp 18  SpO2 100% Physical Exam  Nursing note and vitals reviewed. Constitutional: No distress.  HENT:  Head: Normocephalic and atraumatic.  Right Ear: External ear normal.  Left Ear: External ear normal.  Eyes: Conjunctivae are normal. Right  eye exhibits no discharge. Left eye exhibits no discharge. No scleral icterus.  Neck: Neck supple. No tracheal deviation present.  Cardiovascular: Normal rate, regular rhythm and intact distal pulses.   Pulmonary/Chest: Effort normal and breath sounds normal. No stridor. No respiratory distress. He has no wheezes. He has no rales.  Abdominal: Soft. Bowel sounds are normal. He exhibits no distension. There is no tenderness. There is no rebound and no guarding.  Genitourinary:  Ecchymoses in the suprapubic region, edema, induration and erythema of the penis, scrotal wound with some purulent drainage and induration, no crepitus or fluctuance  Musculoskeletal: He exhibits no edema and no tenderness.  A fistula left upper extremity with palpable pulsatile  Neurological: He is alert. He has normal strength. No cranial nerve deficit (no facial droop, extraocular movements intact, no slurred speech) or sensory deficit. He exhibits normal muscle tone. He displays no seizure activity. Coordination normal.  Skin: Skin is warm. No rash noted. He is not diaphoretic.  Psychiatric: He has a normal mood and affect.    ED Course  Procedures (including critical care time) Labs Review Labs Reviewed  CBC WITH DIFFERENTIAL - Abnormal; Notable for the following:    RBC 3.45 (*)    Hemoglobin 9.6 (*)    HCT 31.7 (*)    RDW 16.2 (*)    Neutrophils Relative % 89 (*)    Neutro Abs 9.0 (*)    Lymphocytes Relative 6 (*)    All other components within normal limits  COMPREHENSIVE METABOLIC PANEL - Abnormal; Notable for the following:    Sodium 126 (*)    Chloride 83 (*)    Glucose, Bld 583 (*)    BUN 36  (*)    Creatinine, Ser 4.24 (*)    Albumin 2.8 (*)    Alkaline Phosphatase 432 (*)    GFR calc non Af Amer 16 (*)    GFR calc Af Amer 18 (*)    Anion gap 21 (*)    All other components within normal limits  BLOOD GAS, VENOUS - Abnormal; Notable for the following:    pH, Ven 7.327 (*)    All other components within normal limits  CBG MONITORING, ED - Abnormal; Notable for the following:    Glucose-Capillary 575 (*)    All other components within normal limits  URINALYSIS, ROUTINE W REFLEX MICROSCOPIC  I-STAT CG4 LACTIC ACID, ED  CBG MONITORING, ED   Medications  insulin regular (NOVOLIN R,HUMULIN R) 250 Units in sodium chloride 0.9 % 250 mL (1 Units/mL) infusion (not administered)  vancomycin (VANCOCIN) 1,500 mg in sodium chloride 0.9 % 500 mL IVPB (not administered)     MDM   Final diagnoses:  Postoperative infection, initial encounter  Hyperglycemia  Chronic renal failure, unspecified stage    Postoperative infection The patient was sent to the emergency room to be admitted for IV antibiotics for a postoperative infection a CT with a penile implant. Patient is afebrile. He is not tachycardic or hypotensive.  No signs of sepsis at this time.  Hyperglycemia Patient is a known diabetic. He is hyperglycemic and he returned without signs of DKA. IV insulin ordered. Will have to be careful about IV fluids considering his chronic renal failure. His last dialysis treatment was yesterday.  I will consult with the medical service for admission.  Dr Patsi Sears saw the patient in the office.    Linwood Dibbles, MD 08/22/14 [phone]

## 2014-08-22 NOTE — ED Notes (Signed)
Dr. Welton Flakes was called and notified of pt's CT scan result.

## 2014-08-22 NOTE — Anesthesia Preprocedure Evaluation (Addendum)
Anesthesia Evaluation  Patient identified by MRN, date of birth, ID band Patient awake    Reviewed: Allergy & Precautions, H&P , NPO status , Patient's Chart, lab work & pertinent test results  Airway Mallampati: III TM Distance: >3 FB Neck ROM: Full    Dental  (+) Edentulous Upper, Upper Dentures   Pulmonary shortness of breath, pneumonia -,  breath sounds clear to auscultation  Pulmonary exam normal       Cardiovascular hypertension, Pt. on medications and Pt. on home beta blockers + CAD, + Past MI, + CABG, + Peripheral Vascular Disease and +CHF Rhythm:Regular Rate:Normal  AV fistula LUE   Neuro/Psych PSYCHIATRIC DISORDERS Anxiety Depression Diabetic Neuropathy Diabetic Retinopathy  Neuromuscular disease    GI/Hepatic Neg liver ROS, hiatal hernia, GERD-  Medicated and Controlled,  Endo/Other  diabetes, Poorly Controlled, Type 1, Insulin DependentHypothyroidism CBG 600+ earlier today Latest BG was 289mg /dl  Renal/GU CRF and DialysisRenal disease   Infected penile prosthesis    Musculoskeletal  (+) Arthritis -, Osteoarthritis,    Abdominal Normal abdominal exam  (+)   Peds  Hematology  (+) anemia ,   Anesthesia Other Findings   Reproductive/Obstetrics                          Anesthesia Physical Anesthesia Plan  ASA: IV and emergent  Anesthesia Plan: General   Post-op Pain Management:    Induction: Intravenous, Rapid sequence and Cricoid pressure planned  Airway Management Planned: Oral ETT  Additional Equipment:   Intra-op Plan:   Post-operative Plan: Extubation in OR  Informed Consent: I have reviewed the patients History and Physical, chart, labs and discussed the procedure including the risks, benefits and alternatives for the proposed anesthesia with the patient or authorized representative who has indicated his/her understanding and acceptance.   Dental advisory  given  Plan Discussed with: CRNA, Anesthesiologist and Surgeon  Anesthesia Plan Comments:         Anesthesia Quick Evaluation

## 2014-08-22 NOTE — ED Notes (Signed)
Dr. Retta Diones to come and see pt before pt's transfer to [facility].

## 2014-08-22 NOTE — H&P (Signed)
[facility] History and Physical  Taylor [name] WVP:[id number] DOB: [birth year] DOA: 08/22/2014  Referring physician: Linwood Dibbles, MD PCP: [name], MD   Chief Complaint: Infection in the Groin  HPI: Taylor [name] is a 43 y.o. male presents with infection in the groin and scrotal area. Patient states that about a month ago he had a penile implant placed. Since that time he had swelling in the scrotal area and has had some drainage. Patient states that the pain became worse and he notified his PCP since his symptoms appeared to be getting worse. Patient states that he has severe pain. He notes possible fevers also not actually checked. Patient was told to come to the ED for admission. Patient has no cough congestion. No urinary complaints. He has no chest pain noted.   Review of Systems:  Constitutional:  No weight loss, night sweats, ++Fevers, chills, ++fatigue.  HEENT:  No headaches, Difficulty swallowing,Tooth/dental problems,Sore throat  Cardio-vascular:  No chest pain, Orthopnea, PND, ++swelling in lower extremities, no anasarca, dizziness, palpitations  GI:  No heartburn, indigestion, abdominal pain, nausea, vomiting, diarrhea  Resp:  No shortness of breath with exertion or at rest. No excess mucus, no productive cough, No non-productive cough, No coughing up of blood  Skin:  ++rash induration erythema in the scrotal and groin area GU:  no dysuria, change in color of urine, no urgency or frequency  No joint pain or swelling. No decreased range of motion.  Psych:  No change in mood or affect. No depression or anxiety.  Past Medical History  Diagnosis Date  . End stage renal disease on dialysis     LUE fistula  . Type I diabetes mellitus     a. 03/2014 admitted with HNK to [facility].  . Diabetic neuropathy     severe, s/p multiple toe amputation  . Hypothyroidism   . Hypertension   . Hyperlipidemia   . H/O hiatal hernia   . GERD (gastroesophageal  reflux disease)   . Anxiety   . Sebaceous cyst     side of neck  . Pneumonia     2010  . Anemia     a. req PRBC's 2011.  [name] PAD (peripheral artery disease)     a. s/p amputation of toes on the right;  b. left LE claudication.  . Coronary artery disease     a. s/p MI;  b. 10/2009 CABG x 3 @ [facility]: LIMA->LAD, VG->OM3, VG->RPDA; c. 11/2010 Cath 3/3 patent grafts;  d 12/2012 Cath: LM 30d, LAD 85p, D1 70, D2 90, LCX 40ost, OM2 100, RCA 90p, 175m, L->LAD ok, VG->OM3 ok, VG->RPDA 30, EF 50%->Med Rx.  . Cataract     right  . Valvular disease     a. 11/2012 Echo: EF 55-60%, mild LVH, mild MR, mild bi-atrial enlargement, mild-mod TR, PASP .  . Myocardial infarction   . CHF (congestive heart failure)   . Depression   . Arthritis     rheumatoid arthritis   . Hx of transfusion of packed red blood cells    Past Surgical History  Procedure Laterality Date  . Dialysis fistula creation      left upper arm fistula  . Amputation      TOES ON BOTH FEET  . Abscess drainage  Behind right ear/occipital scalp  . Skin graft    . Eye surgery  1999  . Coronary artery bypass graft  10/2009    [facility] (Dr. Katrinka Blazing)  . Foot amputation    .  Cardiac catheterization    . Cardiac catheterization  2/14    [facility]: severe 3 vessel CAD with patent grafts, RHC: moderately elevated PCW and pulmonary hypertension  . Penile prosthesis implant N/A 07/24/2014    Procedure: SALINE PENILE INJECTION WITH DISSECTION OF CORPORA ,  IMPLANTATION OF COLOPLAST PENILE PROTHESIS INFLATABLE;  Surgeon: [name], MD;  Location: [facility];  Service: Urology;  Laterality: N/A;   Social History:  reports that he has never smoked. He has quit using smokeless tobacco. He reports that he drinks alcohol. He reports that he does not use illicit drugs.  Allergies  Allergen Reactions  . Amoxicillin-Pot Clavulanate Nausea And Vomiting  . Hydromorphone Hcl     "body started down"  . Rifampin Nausea And Vomiting    Family History    Problem Relation Age of Onset  . Heart disease Other   . Hypertension Other   . Hypertension Mother   . Heart disease Mother   . Diabetes Mother   . Birth defects Paternal Uncle     unaware  . Birth defects Paternal Grandmother     breast     Prior to Admission medications   Medication Sig Start Date End Date Taking? Authorizing Provider  acetaminophen (TYLENOL) 500 MG tablet Take 1,000 mg by mouth every 6 (six) hours as needed (pain).   Yes Historical Provider, MD  amitriptyline (ELAVIL) 25 MG tablet Take 1 tablet (25 mg total) by mouth at bedtime. 08/21/14  Yes Dianne Dun, MD  aspirin 325 MG tablet Take 325 mg by mouth daily with breakfast.    Yes Historical Provider, MD  carvedilol (COREG) 25 MG tablet Take 25 mg by mouth 2 (two) times daily with a meal.   Yes Historical Provider, MD  cinacalcet (SENSIPAR) 60 MG tablet Take 60 mg by mouth at bedtime. With biggest meal 02/16/11  Yes Dianne Dun, MD  citalopram (CELEXA) 20 MG tablet Take 20 mg by mouth every morning.   Yes Historical Provider, MD  cloNIDine (CATAPRES) 0.1 MG tablet Take 0.1 mg by mouth 2 (two) times daily.   Yes Historical Provider, MD  furosemide (LASIX) 80 MG tablet Take 1 tablet (80 mg total) by mouth 2 (two) times daily. 08/01/12  Yes Dianne Dun, MD  HYDROmorphone (DILAUDID) 2 MG tablet Take 2 mg by mouth every 4 (four) hours as needed for severe pain (pain).   Yes Historical Provider, MD  insulin glargine (LANTUS) 100 UNIT/ML injection Inject 36 units into the skin daily at bedtime 12/06/13  Yes Dianne Dun, MD  insulin lispro (HUMALOG) 100 UNIT/ML injection Inject 12 Units into the skin 3 (three) times daily with meals. For every 50 over 200 add 1 unit. 04/26/14  Yes Carlus Pavlov, MD  isosorbide mononitrate (IMDUR) 30 MG 24 hr tablet Take 1 tablet (30 mg total) by mouth 2 (two) times daily. 02/11/11  Yes Antonieta Iba, MD  levothyroxine (SYNTHROID, LEVOTHROID) 75 MCG tablet Take 1 tablet (75 mcg total) by  mouth daily before breakfast. 07/26/13  Yes Dianne Dun, MD  Omega-3 Fatty Acids (FISH OIL) 1000 MG CAPS Take 1,000 mg by mouth daily.    Yes Historical Provider, MD  omeprazole (PRILOSEC) 40 MG capsule Take 40 mg by mouth daily.   Yes Historical Provider, MD  pregabalin (LYRICA) 150 MG capsule Take 150-300 mg by mouth 2 (two) times daily.   Yes Historical Provider, MD  rosuvastatin (CRESTOR) 20 MG tablet Take 1 tablet (20 mg  total) by mouth at bedtime. 12/13/12  Yes Dianne Dun, MD  sevelamer carbonate (RENVELA) 800 MG tablet Take 800 mg by mouth 3 (three) times daily with meals.   Yes Historical Provider, MD   Physical Exam: Filed Vitals:   08/22/14 1556  BP: 182/84  Pulse: 88  Temp: 98.1 F (36.7 C)  TempSrc: Oral  Resp: 18  SpO2: 100%    Wt Readings from Last 3 Encounters:  07/24/14 82.555 kg (182 lb)  07/24/14 82.555 kg (182 lb)  07/11/14 82.555 kg (182 lb)    General:  Appears calm and comfortable Eyes: PERRL, normal lids, irises & conjunctiva ENT: grossly normal hearing, lips & tongue Neck: no LAD, masses or thyromegaly Cardiovascular: RRR, no m/r/g. ++edema Respiratory: CTA bilaterally, no w/r/r. Normal respiratory effort. Abdomen: soft, ntnd but some tenderness noted in the lower abdomen Skin: patient has significant erythema in the groin scrotum is swollen and some drainage noted at the incision site/ There is also significant tenderness noted in the inguinal canal Musculoskeletal: both right and left foot partial amputation from diabetic complications Psychiatric: grossly normal mood and affect, speech fluent and appropriate Neurologic: grossly non-focal.          Labs on Admission:  Basic Metabolic Panel:  Recent Labs Lab 08/22/14 1654  NA 126*  K 4.5  CL 83*  CO2 22  GLUCOSE 583*  BUN 36*  CREATININE 4.24*  CALCIUM 9.2   Liver Function Tests:  Recent Labs Lab 08/22/14 1654  AST 11  ALT 10  ALKPHOS 432*  BILITOT 0.5  PROT 7.9  ALBUMIN 2.8*    No results found for this basename: LIPASE, AMYLASE,  in the last 168 hours No results found for this basename: AMMONIA,  in the last 168 hours CBC:  Recent Labs Lab 08/22/14 1613  WBC 10.3  NEUTROABS 9.0*  HGB 9.6*  HCT 31.7*  MCV 91.9  PLT 167   Cardiac Enzymes: No results found for this basename: CKTOTAL, CKMB, CKMBINDEX, TROPONINI,  in the last 168 hours  BNP (last 3 results)  Recent Labs  03/26/14 1555  PROBNP 1212.0*   CBG:  Recent Labs Lab 08/22/14 1620  GLUCAP 575*    Radiological Exams on Admission: No results found.    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 Infected penile implant with cellulitis -patient has significant induration and drainage from the wound site -will start on IV vancocin and also will place on zosyn for added coverage -in addition glucose is out of control will start on insulin drip -will get urology consult -in addition have ordered a CT abdomen and pelvis for concern of spread of infection into pelvis and abdomen  2. Uncontrolled Diabetes Mellitus -patient has elevated anion gap but also has renal failure -will start on insulin drip and monitor sugars with careful hydration  3. ESRD -will need dialysis thursday -will get renal consult  4. Hypothyroid -continue with home medications -check tsh  5. Hypertension -will continue with home medications -monitor pressures  6. Hyponatremia -related to hyperglycemia -will hydrate   Code Status: Full Code (must indicate code status--if unknown or must be presumed, indicate so) DVT Prophylaxis:Heparin Family Communication: None (indicate person spoken with, if applicable, with phone number if by telephone) Disposition Plan: Home (indicate  anticipated  LOS)  Time spent:  [facility] Pager [phone]

## 2014-08-22 NOTE — ED Notes (Signed)
[facility] and Floor Unit RN at [facility] were notified of pt's being transferred to [facility] at this time, per Dr. Retta Diones.

## 2014-08-22 NOTE — ED Notes (Addendum)
Pt was sent from Dr Imelda Pillow office for possible infection. Pt states that he in genital area and his blood sugars are high.

## 2014-08-22 NOTE — ED Notes (Signed)
Dr.Dahlstedt at bedside  

## 2014-08-22 NOTE — ED Notes (Addendum)
Critical Glucose of 583 reported to [facility] and [place] EDP

## 2014-08-22 NOTE — Progress Notes (Addendum)
ANTIBIOTIC CONSULT NOTE - INITIAL  Pharmacy Consult for vancomycin Indication: cellulitis  Allergies  Allergen Reactions  . Amoxicillin-Pot Clavulanate Nausea And Vomiting  . Hydromorphone Hcl     "body started down"  . Rifampin Nausea And Vomiting    Patient Measurements:   Adjusted Body Weight:   Vital Signs: Temp: 98.1 F (36.7 C) (10/14 1556) Temp Source: Oral (10/14 1556) BP: 182/84 mmHg (10/14 1556) Pulse Rate: 88 (10/14 1556) Intake/Output from previous day:   Intake/Output from this shift:    Labs:  Recent Labs  08/22/14 1613  WBC 10.3  HGB 9.6*  PLT 167   The CrCl is unknown because both a height and weight (above a minimum accepted value) are required for this calculation. No results found for this basename: VANCOTROUGH, VANCOPEAK, VANCORANDOM, GENTTROUGH, GENTPEAK, GENTRANDOM, TOBRATROUGH, TOBRAPEAK, TOBRARND, AMIKACINPEAK, AMIKACINTROU, AMIKACIN,  in the last 72 hours   Microbiology: No results found for this or any previous visit (from the past 720 hour(s)).  Medical History: Past Medical History  Diagnosis Date  . End stage renal disease on dialysis     LUE fistula  . Type I diabetes mellitus     a. 03/2014 admitted with HNK to [facility].  . Diabetic neuropathy     severe, s/p multiple toe amputation  . Hypothyroidism   . Hypertension   . Hyperlipidemia   . H/O hiatal hernia   . GERD (gastroesophageal reflux disease)   . Anxiety   . Sebaceous cyst     side of neck  . Pneumonia     2010  . Anemia     a. req PRBC's 2011.  [name] PAD (peripheral artery disease)     a. s/p amputation of toes on the right;  b. left LE claudication.  . Coronary artery disease     a. s/p MI;  b. 10/2009 CABG x 3 @ [facility]: LIMA->LAD, VG->OM3, VG->RPDA; c. 11/2010 Cath 3/3 patent grafts;  d 12/2012 Cath: LM 30d, LAD 85p, D1 70, D2 90, LCX 40ost, OM2 100, RCA 90p, 163m, L->LAD ok, VG->OM3 ok, VG->RPDA 30, EF 50%->Med Rx.  . Cataract     right  . Valvular disease     a.  11/2012 Echo: EF 55-60%, mild LVH, mild MR, mild bi-atrial enlargement, mild-mod TR, PASP .  . Myocardial infarction   . CHF (congestive heart failure)   . Depression   . Arthritis     rheumatoid arthritis   . Hx of transfusion of packed red blood cells     Assessment: 43 YOM sent from urology office to [facility] ED for infection near genitalia.  He has end stage renal disease on HD per PMH.  Pharmacy asked to dose vancomycin.  10/14 >> vancomycin  >>  Tmax: afeb WBCs: WNL Renal: ESRD  No cultures currently ordered  Goal of Therapy:  Pre-HD level 15-78mcg/mL  Plan:   Load with vancomycin 1500mg  IV x 1 now then 1gm IV on HD days  Need to follow-up days gets HD  , PharmD, BCPS.   Pager: Juliette Alcide  08/22/2014,5:26 PM

## 2014-08-22 NOTE — ED Notes (Signed)
Notified RN,Allan pt. CBG 393.

## 2014-08-22 NOTE — ED Notes (Signed)
LEFT ARM RESTRICTED-DIAYLSIS

## 2014-08-22 NOTE — Consult Note (Signed)
Urology Consult  Consulting MD: Dr. Freda Munro  CC: Penile/scrotal swelling  HPI: This is a 43 year old male with end-stage renal disease, on hemodialysis and type 1 diabetes who is status post placement of a three-piece inflatable Coloplast penile prosthesis on 07/24/2014 by Dr. Jethro Bolus. The patient over the past week or 2 has had worsening pain, with some scrotal drainage. He presented to the office today for evaluation and was felt to have a possible infection. He was sent to the emergency room for possible admission as well as CT scan. CT revealed ascites, as well as gas along his penile prosthesis. There is worried for gas forming bacteria. The patient has not had any fevers but has had shakes and chills. He has had an eschar on the end of his penis which he has, at times, had to remove to help him urinate, as it covers his meatus. He still makes urine despite his dialysis.  In the emergency room, the patient has significant penile edema and tenderness. There is no crepitus. The skin looks viable. There is minimal scrotal wound discharge. I did review his CT scan, and am worried about a gas forming bacteria along his prosthesis. Because of this, I have recommended that we proceed with explant of his penile prosthesis emergently.  PMH: Past Medical History  Diagnosis Date  . End stage renal disease on dialysis     LUE fistula  . Type I diabetes mellitus     a. 03/2014 admitted with HNK to [facility]  . Diabetic neuropathy     severe, s/p multiple toe amputation  . Hypothyroidism   . Hypertension   . Hyperlipidemia   . H/O hiatal hernia   . GERD (gastroesophageal reflux disease)   . Anxiety   . Sebaceous cyst     side of neck  . Pneumonia     2010  . Anemia     a. req PRBC's 2011.  [name] PAD (peripheral artery disease)     a. s/p amputation of toes on the right;  b. left LE claudication.  . Coronary artery disease     a. s/p MI;  b. 10/2009 CABG x 3 @ [facility]: LIMA->LAD, VG->OM3,  VG->RPDA; c. 11/2010 Cath 3/3 patent grafts;  d 12/2012 Cath: LM 30d, LAD 85p, D1 70, D2 90, LCX 40ost, OM2 100, RCA 90p, 162m, L->LAD ok, VG->OM3 ok, VG->RPDA 30, EF 50%->Med Rx.  . Cataract     right  . Valvular disease     a. 11/2012 Echo: EF 55-60%, mild LVH, mild MR, mild bi-atrial enlargement, mild-mod TR, PASP .  . Myocardial infarction   . CHF (congestive heart failure)   . Depression   . Arthritis     rheumatoid arthritis   . Hx of transfusion of packed red blood cells     PSH: Past Surgical History  Procedure Laterality Date  . Dialysis fistula creation      left upper arm fistula  . Amputation      TOES ON BOTH FEET  . Abscess drainage  Behind right ear/occipital scalp  . Skin graft    . Eye surgery  1999  . Coronary artery bypass graft  10/2009    [facility] (Dr. Katrinka Blazing)  . Foot amputation    . Cardiac catheterization    . Cardiac catheterization  2/14    [facility]: severe 3 vessel CAD with patent grafts, RHC: moderately elevated PCW and pulmonary hypertension  . Penile prosthesis implant N/A 07/24/2014  Procedure: SALINE PENILE INJECTION WITH DISSECTION OF CORPORA ,  IMPLANTATION OF COLOPLAST PENILE PROTHESIS INFLATABLE;  Surgeon: [name], MD;  Location: [facility];  Service: Urology;  Laterality: N/A;    Allergies: Allergies  Allergen Reactions  . Amoxicillin-Pot Clavulanate Nausea And Vomiting  . Hydromorphone Hcl     "body started down"  08/22/14 - pt states no longer has problems with this  . Rifampin Nausea And Vomiting    Medications:  (Not in a hospital admission)   Social History: History   Social History  . Marital Status: Single    Spouse Name: N/A    Number of Children: N/A  . Years of Education: N/A   Occupational History  . Not on file.   Social History Main Topics  . Smoking status: Never Smoker   . Smokeless tobacco: Former Neurosurgeon    Types: Chew    Quit date: 08/23/1995  . Alcohol Use: 0.0 oz/week     Comment: occasional  beer  . Drug Use: No  . Sexual Activity: Not Currently   Other Topics Concern  . Not on file   Social History Narrative   He is a DNR- yellow sheet given to pt today.    Family History: Family History  Problem Relation Age of Onset  . Heart disease Other   . Hypertension Other   . Hypertension Mother   . Heart disease Mother   . Diabetes Mother   . Birth defects Paternal Uncle     unaware  . Birth defects Paternal Grandmother     breast    Review of Systems: Positive: Penile and scrotal swelling, pain, shakes and chills. Negative: He denies any fever, but he rarely has a temperature above 97.  A further 10 point review of systems was negative except what is listed in the HPI.  Physical Exam: @VITALS2 @ General: No acute distress.  Awake. Head:  Normocephalic.  Atraumatic. ENT:  EOMI.  Mucous membranes moist Neck:  Supple.  No lymphadenopathy. CV:  S1 present. S2 present. Regular rate. Pulmonary: Equal effort bilaterally.  Clear to auscultation bilaterally. Abdomen: Soft. Non-tender to palpation. There is a mass in his right upper inguinal area consistent with his prosthesis reservoir Skin:  Normal turgor.  No visible rash. Extremity: No gross deformity of bilateral upper extremities.  No gross deformity of bilateral lower extremities. There is thenar wasting bilaterally. Neurologic: Alert. Appropriate mood.  Penis:  Circumcised  eschar on his distal glands, partially covering his meatus Urethra: No Foley catheter in place.  Orthotopic meatus. Scrotum: Poorly healed incision in the midline. No overt discharge from this. There is no crepitus Testicles: Descended bilaterally. I cannot specifically feel his prosthesis pop Epididymis: Nonpalpable  Studies:  Recent Labs     08/22/14  1613  HGB  9.6*  WBC  10.3  PLT  167    Recent Labs     08/22/14  1654  NA  126*  K  4.5  CL  83*  CO2  22  BUN  36*  CREATININE  4.24*  CALCIUM  9.2  GFRNONAA  16*  GFRAA   18*     No results found for this basename: PT, INR, APTT,  in the last 72 hours   No components found with this basename: ABG,   I reviewed the patient's CT images with him.  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 With him being diabetic, and having possible gas-forming organisms based on CT scan findings, I strongly recommend explant of the prosthesis tonight. I have discussed the nature of the procedure with the patient-the fact that I must remove the prosthesis cylinders, pump and reservoir remove any devitalized tissue, irrigate copiously, and leave drains in. Dr.Khanhas already started the patient appropriately on broad-spectrum antibiotics. He does need admission following this, optimally to the renal floor at [facility]. I would strongly suggest that we perform his surgery here to avoid any delays in getting him to the operating room and getting his prosthesis out. The patient agrees with this. He does also realize that taking this implant out will more than likely negate the ability later on to place another prosthesis. He is satisfied with this.    Pager:[phone]

## 2014-08-23 ENCOUNTER — Encounter (HOSPITAL_COMMUNITY): Payer: Self-pay | Admitting: *Deleted

## 2014-08-23 ENCOUNTER — Inpatient Hospital Stay (HOSPITAL_COMMUNITY): Payer: Medicare Other

## 2014-08-23 DIAGNOSIS — T814XXA Infection following a procedure, initial encounter: Secondary | ICD-10-CM

## 2014-08-23 LAB — BASIC METABOLIC PANEL
ANION GAP: 17 — AB (ref 5–15)
ANION GAP: 17 — AB (ref 5–15)
ANION GAP: 17 — AB (ref 5–15)
BUN: 36 mg/dL — AB (ref 6–23)
BUN: 38 mg/dL — AB (ref 6–23)
BUN: 38 mg/dL — ABNORMAL HIGH (ref 6–23)
CHLORIDE: 91 meq/L — AB (ref 96–112)
CHLORIDE: 90 meq/L — AB (ref 96–112)
CHLORIDE: 91 meq/L — AB (ref 96–112)
CO2: 23 meq/L (ref 19–32)
CO2: 24 mEq/L (ref 19–32)
CO2: 23 meq/L (ref 19–32)
CREATININE: 4.65 mg/dL — AB (ref 0.50–1.35)
Calcium: 9.2 mg/dL (ref 8.4–10.5)
Calcium: 8.9 mg/dL (ref 8.4–10.5)
Calcium: 8.8 mg/dL (ref 8.4–10.5)
Creatinine, Ser: 4.7 mg/dL — ABNORMAL HIGH (ref 0.50–1.35)
Creatinine, Ser: 4.84 mg/dL — ABNORMAL HIGH (ref 0.50–1.35)
GFR calc Af Amer: 16 mL/min — ABNORMAL LOW (ref >90)
GFR calc Af Amer: 16 mL/min — ABNORMAL LOW (ref >90)
GFR calc Af Amer: 15 mL/min — ABNORMAL LOW (ref >90)
GFR calc non Af Amer: 14 mL/min — ABNORMAL LOW (ref >90)
GFR calc non Af Amer: 14 mL/min — ABNORMAL LOW (ref >90)
GFR calc non Af Amer: 13 mL/min — ABNORMAL LOW (ref >90)
GLUCOSE: 98 mg/dL (ref 70–99)
Glucose, Bld: 85 mg/dL (ref 70–99)
Glucose, Bld: 117 mg/dL — ABNORMAL HIGH (ref 70–99)
Potassium: 4.2 mEq/L (ref 3.7–5.3)
Potassium: 4.3 mEq/L (ref 3.7–5.3)
Potassium: 4 mEq/L (ref 3.7–5.3)
SODIUM: 131 meq/L — AB (ref 137–147)
Sodium: 131 mEq/L — ABNORMAL LOW (ref 137–147)
Sodium: 131 mEq/L — ABNORMAL LOW (ref 137–147)

## 2014-08-23 LAB — GLUCOSE, CAPILLARY
GLUCOSE-CAPILLARY: 123 mg/dL — AB (ref 70–99)
GLUCOSE-CAPILLARY: 138 mg/dL — AB (ref 70–99)
GLUCOSE-CAPILLARY: 152 mg/dL — AB (ref 70–99)
GLUCOSE-CAPILLARY: 157 mg/dL — AB (ref 70–99)
GLUCOSE-CAPILLARY: 163 mg/dL — AB (ref 70–99)
GLUCOSE-CAPILLARY: 170 mg/dL — AB (ref 70–99)
GLUCOSE-CAPILLARY: 45 mg/dL — AB (ref 70–99)
GLUCOSE-CAPILLARY: 76 mg/dL (ref 70–99)
GLUCOSE-CAPILLARY: 92 mg/dL (ref 70–99)
GLUCOSE-CAPILLARY: 93 mg/dL (ref 70–99)
Glucose-Capillary: 75 mg/dL (ref 70–99)
Glucose-Capillary: 67 mg/dL — ABNORMAL LOW (ref 70–99)
Glucose-Capillary: 128 mg/dL — ABNORMAL HIGH (ref 70–99)
Glucose-Capillary: 125 mg/dL — ABNORMAL HIGH (ref 70–99)
Glucose-Capillary: 183 mg/dL — ABNORMAL HIGH (ref 70–99)
Glucose-Capillary: 172 mg/dL — ABNORMAL HIGH (ref 70–99)

## 2014-08-23 LAB — CBC
HEMATOCRIT: 30.8 % — AB (ref 39.0–52.0)
Hemoglobin: 9.9 g/dL — ABNORMAL LOW (ref 13.0–17.0)
MCH: 28.2 pg (ref 26.0–34.0)
MCHC: 32.1 g/dL (ref 30.0–36.0)
MCV: 87.7 fL (ref 78.0–100.0)
Platelets: 238 10*3/uL (ref 150–400)
RBC: 3.51 MIL/uL — ABNORMAL LOW (ref 4.22–5.81)
RDW: 16.1 % — ABNORMAL HIGH (ref 11.5–15.5)
WBC: 11.9 10*3/uL — ABNORMAL HIGH (ref 4.0–10.5)

## 2014-08-23 LAB — MRSA PCR SCREENING: MRSA BY PCR: NEGATIVE

## 2014-08-23 LAB — TSH: TSH: 8.46 u[IU]/mL — AB (ref 0.350–4.500)

## 2014-08-23 MED ORDER — HEPARIN SODIUM (PORCINE) 1000 UNIT/ML DIALYSIS
1000.0000 [IU] | INTRAMUSCULAR | Status: DC | PRN
  Filled 2014-08-23: qty 1

## 2014-08-23 MED ORDER — CLONIDINE HCL 0.1 MG PO TABS
0.1000 mg | ORAL_TABLET | Freq: Two times a day (BID) | ORAL | Status: DC
  Administered 2014-08-23: 0.1 mg via ORAL
  Filled 2014-08-23 (×4): qty 1

## 2014-08-23 MED ORDER — HEPARIN SODIUM (PORCINE) 5000 UNIT/ML IJ SOLN
5000.0000 [IU] | Freq: Three times a day (TID) | INTRAMUSCULAR | Status: DC
  Administered 2014-08-23 – 2014-08-25 (×6): 5000 [IU] via SUBCUTANEOUS
  Filled 2014-08-23 (×18): qty 1

## 2014-08-23 MED ORDER — VANCOMYCIN HCL IN DEXTROSE 1-5 GM/200ML-% IV SOLN
1000.0000 mg | Freq: Once | INTRAVENOUS | Status: AC
  Administered 2014-08-23: 1000 mg via INTRAVENOUS
  Filled 2014-08-23: qty 200

## 2014-08-23 MED ORDER — INSULIN ASPART 100 UNIT/ML ~~LOC~~ SOLN
0.0000 [IU] | Freq: Three times a day (TID) | SUBCUTANEOUS | Status: DC
  Administered 2014-08-23: 2 [IU] via SUBCUTANEOUS
  Administered 2014-08-24: 9 [IU] via SUBCUTANEOUS
  Administered 2014-08-24: 3 [IU] via SUBCUTANEOUS
  Administered 2014-08-24: 9 [IU] via SUBCUTANEOUS
  Administered 2014-08-25: 3 [IU] via SUBCUTANEOUS
  Administered 2014-08-25: 2 [IU] via SUBCUTANEOUS

## 2014-08-23 MED ORDER — DOXERCALCIFEROL 4 MCG/2ML IV SOLN
INTRAVENOUS | Status: AC
  Filled 2014-08-23: qty 2

## 2014-08-23 MED ORDER — FUROSEMIDE 80 MG PO TABS
80.0000 mg | ORAL_TABLET | Freq: Two times a day (BID) | ORAL | Status: DC
  Administered 2014-08-23: 80 mg via ORAL
  Filled 2014-08-23 (×4): qty 1

## 2014-08-23 MED ORDER — SODIUM CHLORIDE 0.9 % IV SOLN: 100.0000 mL | INTRAVENOUS | Status: DC | PRN

## 2014-08-23 MED ORDER — PREGABALIN 100 MG PO CAPS
300.0000 mg | ORAL_CAPSULE | Freq: Every day | ORAL | Status: DC
  Administered 2014-08-23 – 2014-08-26 (×4): 300 mg via ORAL
  Filled 2014-08-23 (×4): qty 3

## 2014-08-23 MED ORDER — PENTAFLUOROPROP-TETRAFLUOROETH EX AERO: 1.0000 "application " | INHALATION_SPRAY | CUTANEOUS | Status: DC | PRN

## 2014-08-23 MED ORDER — DOXERCALCIFEROL 4 MCG/2ML IV SOLN
1.0000 ug | INTRAVENOUS | Status: DC
  Administered 2014-08-23 – 2014-08-25 (×2): 1 ug via INTRAVENOUS
  Filled 2014-08-23 (×2): qty 2

## 2014-08-23 MED ORDER — CARVEDILOL 6.25 MG PO TABS
6.2500 mg | ORAL_TABLET | Freq: Two times a day (BID) | ORAL | Status: DC
  Administered 2014-08-24 – 2014-08-26 (×6): 6.25 mg via ORAL
  Filled 2014-08-23 (×9): qty 1

## 2014-08-23 MED ORDER — CITALOPRAM HYDROBROMIDE 20 MG PO TABS
20.0000 mg | ORAL_TABLET | Freq: Every morning | ORAL | Status: DC
  Administered 2014-08-23: 20 mg via ORAL
  Filled 2014-08-23: qty 1

## 2014-08-23 MED ORDER — LIDOCAINE HCL (PF) 1 % IJ SOLN: 5.0000 mL | INTRAMUSCULAR | Status: DC | PRN

## 2014-08-23 MED ORDER — METOCLOPRAMIDE HCL 5 MG/ML IJ SOLN
5.0000 mg | Freq: Every day | INTRAMUSCULAR | Status: DC | PRN
  Administered 2014-08-23 – 2014-08-24 (×2): 5 mg via INTRAVENOUS
  Filled 2014-08-23 (×2): qty 2

## 2014-08-23 MED ORDER — PIPERACILLIN-TAZOBACTAM 3.375 G IVPB 30 MIN
3.3750 g | Freq: Once | INTRAVENOUS | Status: DC
  Filled 2014-08-23: qty 50

## 2014-08-23 MED ORDER — HYDROMORPHONE HCL 1 MG/ML IJ SOLN: 0.2500 mg | INTRAMUSCULAR | Status: DC | PRN

## 2014-08-23 MED ORDER — DEXTROSE 50 % IV SOLN: 25.0000 mL | INTRAVENOUS | Status: DC | PRN

## 2014-08-23 MED ORDER — ISOSORBIDE MONONITRATE ER 30 MG PO TB24
30.0000 mg | ORAL_TABLET | Freq: Two times a day (BID) | ORAL | Status: DC
  Filled 2014-08-23 (×2): qty 1

## 2014-08-23 MED ORDER — PANTOPRAZOLE SODIUM 40 MG PO TBEC
80.0000 mg | DELAYED_RELEASE_TABLET | Freq: Every day | ORAL | Status: DC
  Administered 2014-08-23: 80 mg via ORAL
  Filled 2014-08-23: qty 2

## 2014-08-23 MED ORDER — PIPERACILLIN-TAZOBACTAM IN DEX 2-0.25 GM/50ML IV SOLN
2.2500 g | Freq: Three times a day (TID) | INTRAVENOUS | Status: DC
  Administered 2014-08-23 – 2014-08-27 (×13): 2.25 g via INTRAVENOUS
  Filled 2014-08-23 (×15): qty 50

## 2014-08-23 MED ORDER — NEPRO/CARBSTEADY PO LIQD: 237.0000 mL | ORAL | Status: DC | PRN

## 2014-08-23 MED ORDER — DARBEPOETIN ALFA-POLYSORBATE 40 MCG/0.4ML IJ SOLN
40.0000 ug | INTRAMUSCULAR | Status: DC
  Administered 2014-08-23: 40 ug via INTRAVENOUS
  Filled 2014-08-23: qty 0.4

## 2014-08-23 MED ORDER — DEXTROSE 50 % IV SOLN
25.0000 mL | Freq: Once | INTRAVENOUS | Status: AC | PRN
Start: 2014-08-23 — End: 2014-08-23

## 2014-08-23 MED ORDER — DARBEPOETIN ALFA-POLYSORBATE 40 MCG/0.4ML IJ SOLN
INTRAMUSCULAR | Status: AC
  Filled 2014-08-23: qty 0.4

## 2014-08-23 MED ORDER — LEVOTHYROXINE SODIUM 75 MCG PO TABS
75.0000 ug | ORAL_TABLET | Freq: Every day | ORAL | Status: DC
  Administered 2014-08-23 – 2014-08-27 (×5): 75 ug via ORAL
  Filled 2014-08-23 (×6): qty 1

## 2014-08-23 MED ORDER — PREGABALIN 75 MG PO CAPS
150.0000 mg | ORAL_CAPSULE | Freq: Every day | ORAL | Status: DC
  Administered 2014-08-23 – 2014-08-27 (×5): 150 mg via ORAL
  Filled 2014-08-23 (×5): qty 2

## 2014-08-23 MED ORDER — AMITRIPTYLINE HCL 25 MG PO TABS
25.0000 mg | ORAL_TABLET | Freq: Every day | ORAL | Status: DC
  Administered 2014-08-23: 25 mg via ORAL
  Filled 2014-08-23 (×2): qty 1

## 2014-08-23 MED ORDER — DEXTROSE-NACL 5-0.45 % IV SOLN
INTRAVENOUS | Status: DC
  Administered 2014-08-23 – 2014-08-24 (×3): via INTRAVENOUS

## 2014-08-23 MED ORDER — SEVELAMER CARBONATE 800 MG PO TABS
800.0000 mg | ORAL_TABLET | Freq: Three times a day (TID) | ORAL | Status: DC
  Administered 2014-08-23 – 2014-08-27 (×12): 800 mg via ORAL
  Filled 2014-08-23 (×16): qty 1

## 2014-08-23 MED ORDER — HYDROMORPHONE HCL 1 MG/ML IJ SOLN
0.5000 mg | INTRAMUSCULAR | Status: DC | PRN
  Administered 2014-08-23: 1 mg via INTRAVENOUS
  Administered 2014-08-24 – 2014-08-27 (×14): 0.5 mg via INTRAVENOUS
  Filled 2014-08-23 (×15): qty 1

## 2014-08-23 MED ORDER — CINACALCET HCL 30 MG PO TABS
60.0000 mg | ORAL_TABLET | Freq: Every day | ORAL | Status: DC
  Administered 2014-08-23 – 2014-08-27 (×5): 60 mg via ORAL
  Filled 2014-08-23 (×7): qty 2

## 2014-08-23 MED ORDER — SODIUM CHLORIDE 0.9 % IV BOLUS (SEPSIS)
250.0000 mL | Freq: Once | INTRAVENOUS | Status: AC
  Administered 2014-08-23: 250 mL via INTRAVENOUS

## 2014-08-23 MED ORDER — INSULIN GLARGINE 100 UNIT/ML ~~LOC~~ SOLN
30.0000 [IU] | Freq: Every day | SUBCUTANEOUS | Status: DC
  Administered 2014-08-23 – 2014-08-24 (×2): 30 [IU] via SUBCUTANEOUS
  Filled 2014-08-23 (×2): qty 0.3

## 2014-08-23 MED ORDER — PIPERACILLIN-TAZOBACTAM 3.375 G IVPB
3.3750 g | Freq: Three times a day (TID) | INTRAVENOUS | Status: DC
  Filled 2014-08-23: qty 50

## 2014-08-23 MED ORDER — METOCLOPRAMIDE HCL 5 MG/ML IJ SOLN: 10.0000 mg | Freq: Once | INTRAMUSCULAR | Status: DC | PRN

## 2014-08-23 MED ORDER — ROSUVASTATIN CALCIUM 20 MG PO TABS
20.0000 mg | ORAL_TABLET | Freq: Every day | ORAL | Status: DC
  Administered 2014-08-23 – 2014-08-26 (×4): 20 mg via ORAL
  Filled 2014-08-23 (×5): qty 1

## 2014-08-23 MED ORDER — HYDROMORPHONE HCL 1 MG/ML IJ SOLN
INTRAMUSCULAR | Status: AC
  Filled 2014-08-23: qty 1

## 2014-08-23 MED ORDER — ASPIRIN 325 MG PO TABS
325.0000 mg | ORAL_TABLET | Freq: Every day | ORAL | Status: DC
  Administered 2014-08-23 – 2014-08-27 (×5): 325 mg via ORAL
  Filled 2014-08-23 (×7): qty 1

## 2014-08-23 MED ORDER — OMEGA-3-ACID ETHYL ESTERS 1 G PO CAPS
1.0000 g | ORAL_CAPSULE | Freq: Every day | ORAL | Status: DC
  Administered 2014-08-23 – 2014-08-27 (×5): 1 g via ORAL
  Filled 2014-08-23 (×5): qty 1

## 2014-08-23 MED ORDER — INSULIN REGULAR HUMAN 100 UNIT/ML IJ SOLN
INTRAMUSCULAR | Status: DC
  Administered 2014-08-23: 0.2 [IU]/h via INTRAVENOUS

## 2014-08-23 MED ORDER — LIDOCAINE-PRILOCAINE 2.5-2.5 % EX CREA: 1.0000 "application " | TOPICAL_CREAM | CUTANEOUS | Status: DC | PRN

## 2014-08-23 MED ORDER — ALTEPLASE 2 MG IJ SOLR
2.0000 mg | Freq: Once | INTRAMUSCULAR | Status: AC | PRN
  Filled 2014-08-23: qty 2

## 2014-08-23 MED ORDER — CARVEDILOL 25 MG PO TABS
25.0000 mg | ORAL_TABLET | Freq: Two times a day (BID) | ORAL | Status: DC
  Administered 2014-08-23: 25 mg via ORAL
  Filled 2014-08-23 (×4): qty 1

## 2014-08-23 MED ORDER — PANTOPRAZOLE SODIUM 40 MG IV SOLR
40.0000 mg | INTRAVENOUS | Status: DC
  Administered 2014-08-23: 40 mg via INTRAVENOUS
  Filled 2014-08-23 (×2): qty 40

## 2014-08-23 MED ORDER — DEXTROSE 50 % IV SOLN
INTRAVENOUS | Status: AC
  Administered 2014-08-23: 22 mL
  Filled 2014-08-23: qty 50

## 2014-08-23 MED ORDER — SODIUM CHLORIDE 0.9 % IV SOLN
INTRAVENOUS | Status: DC
Start: 2014-08-23 — End: 2014-08-23

## 2014-08-23 NOTE — Progress Notes (Signed)
08/23/2014 1:43 PM  BP 85/47.  Pt drowsy but arousable and oriented.  CBG WNL.  Dr. Susie Cassette notified.  Orders received.  Primary RN to follow. Taylor [name]

## 2014-08-23 NOTE — Progress Notes (Signed)
.  5 mg dilaudid wasted in sink. Name deleted from pyxis. Witness Norberto Sorenson RN

## 2014-08-23 NOTE — Progress Notes (Signed)
Urology Progress Note  1 Day Post-Op explantation of IPP 2ndary infection. IDDM, ESRD on Dialysis. C/s pending, but CT showed gas along side the prosthesis, and pt had swollen, tender penis and scrotum.   Antibiotics: Clindamycin and Vancomycin Subjective:     No acute urologic events overnight. Ambulation:   negative Flatus:    positive Bowel movement  negative  Pain: some relief  Objective:  Blood pressure 118/54, pulse 73, temperature 97.7 F (36.5 C), temperature source Oral, resp. rate 16, height 6\' 3"  (1.905 m), weight 85.6 kg (188 lb 11.4 oz), SpO2 100.00%.  Physical Exam:  General:  No acute distress, awake Extremities: extremities normal, atraumatic, no cyanosis or edema Genitourinary:  Penis: less edema and less tender than exam yesterday Foley: remains: pt makes some urine ( low quality)    I/O last 3 completed shifts: In: 320 [I.V.:320] Out: 0   Recent Labs     08/22/14  1613  08/23/14  0326  HGB  9.6*  9.9*  WBC  10.3  11.9*  PLT  167  238    Recent Labs     08/22/14  1654  08/23/14  0326  NA  126*  131*  K  4.5  4.2  CL  83*  91*  CO2  22  23  BUN  36*  36*  CREATININE  4.24*  4.65*  CALCIUM  9.2  9.2  GFRNONAA  16*  14*  GFRAA  18*  16*     No results found for this basename: PT, INR, APTT,  in the last 72 hours   No components found with this basename: ABG,   Assessment/Plan:  Catheter not removed. For removal in AM. Probable dialysis today OK for d/ c from urology standpoint with antibiotic per end of dialysis run: pharmacy can calculate dose.

## 2014-08-23 NOTE — Progress Notes (Signed)
ANTIBIOTIC CONSULT NOTE - FOLLOW UP  Pharmacy Consult for Zosyn Indication: scrotal/groin infection  Allergies  Allergen Reactions  . Amoxicillin-Pot Clavulanate Nausea And Vomiting  . Hydromorphone Hcl     "body started down"  08/22/14 - pt states no longer has problems with this  . Rifampin Nausea And Vomiting    Patient Measurements: Height: 6\' 3"  (190.5 cm) Weight: 188 lb 11.4 oz (85.6 kg) IBW/kg (Calculated) : 84.5 Adjusted Body Weight:   Vital Signs: Temp: 98.9 F (37.2 C) (10/15 0919) Temp Source: Oral (10/15 0919) BP: 90/54 mmHg (10/15 0919) Pulse Rate: 75 (10/15 0919) Intake/Output from previous day: 10/14 0701 - 10/15 0700 In: 320 [I.V.:320] Out: 0  Intake/Output from this shift:    Labs:  Recent Labs  08/22/14 1613  08/23/14 0326 08/23/14 0552 08/23/14 0725  WBC 10.3  --  11.9*  --   --   HGB 9.6*  --  9.9*  --   --   PLT 167  --  238  --   --   CREATININE  --   < > 4.65* 4.70* 4.84*  < > = values in this interval not displayed. Estimated Creatinine Clearance: 23 ml/min (by C-G formula based on Cr of 4.84). No results found for this basename: VANCOTROUGH, 08/25/14, VANCORANDOM, GENTTROUGH, GENTPEAK, GENTRANDOM, TOBRATROUGH, TOBRAPEAK, TOBRARND, AMIKACINPEAK, AMIKACINTROU, AMIKACIN,  in the last 72 hours   Microbiology: Recent Results (from the past 720 hour(s))  MRSA PCR SCREENING     Status: None   Collection Time    08/23/14  4:16 AM      Result Value Ref Range Status   MRSA by PCR NEGATIVE  NEGATIVE Final   Comment:            The GeneXpert MRSA Assay (FDA     approved for NASAL specimens     only), is one component of a     comprehensive MRSA colonization     surveillance program. It is not     intended to diagnose MRSA     infection nor to guide or     monitor treatment for     MRSA infections.    Anti-infectives   Start     Dose/Rate Route Frequency Ordered Stop   08/23/14 0800  piperacillin-tazobactam (ZOSYN) IVPB 3.375 g   Status:  Discontinued     3.375 g 12.5 mL/hr over 240 Minutes Intravenous Every 8 hours 08/23/14 0235 08/23/14 0237   08/23/14 0400  piperacillin-tazobactam (ZOSYN) IVPB 2.25 g     2.25 g 100 mL/hr over 30 Minutes Intravenous Every 8 hours 08/23/14 0238     08/23/14 0245  piperacillin-tazobactam (ZOSYN) IVPB 3.375 g  Status:  Discontinued     3.375 g 100 mL/hr over 30 Minutes Intravenous  Once 08/23/14 0235 08/23/14 0237   08/22/14 2339  polymyxin B 500,000 Units, bacitracin 50,000 Units in sodium chloride irrigation 0.9 % 500 mL irrigation  Status:  Discontinued       As needed 08/22/14 2340 08/23/14 0024   08/22/14 2200  clindamycin (CLEOCIN) IVPB 600 mg     600 mg 100 mL/hr over 30 Minutes Intravenous 3 times per day 08/22/14 2144     08/22/14 1800  vancomycin (VANCOCIN) 1,500 mg in sodium chloride 0.9 % 500 mL IVPB     1,500 mg 250 mL/hr over 120 Minutes Intravenous NOW 08/22/14 1739 08/22/14 2143   08/22/14 1730  vancomycin (VANCOCIN) IVPB 1000 mg/200 mL premix  Status:  Discontinued  1,000 mg 200 mL/hr over 60 Minutes Intravenous  Once 08/22/14 1720 08/22/14 1736      Assessment: 43yo male ESRD pt with groin/scrotal infection and CT concerning for gas-forming bacteria, now s/p emergent removal of penile implant last PM.  Pt is afebrile with decreased tenderness & swelling this AM.  His MRSA PCR was negative and wound cx are ntd.  Dr. Imelda Pillow note state pt is on Clindamycin and Vancomycin, however pt is on Zosyn and Clindamycin.  Pt did receive Vancomycin x 1 dose last PM pre-op with next HD sometime today.  He would not need another dose of Vancomycin until after HD.  With concern for gas-forming bacteria based on CT, expect Zosyn is adequate and Vancomycin not necessary unless cultures direct otherwise or clinical condition worsens.  Goal of Therapy:  resolution of infection  Plan:  1-  Continue Zosyn 2.25g IV q8 2-  F/U plans for Vancomycin  Marisue Humble,  PharmD Clinical Pharmacist [facility]

## 2014-08-23 NOTE — Progress Notes (Signed)
43yo male c/o genital pain and drainage from scrotum 27mo s/p penile prosthesis, now s/p emergent removal of hardware, to begin IV ABX for cellulitis.  Will start Zosyn 2.25g IV Q8H for ESRD and monitor CBC and Cx.  Vernard Gambles, PharmD, BCPS 08/23/2014 2:38 AM

## 2014-08-23 NOTE — Progress Notes (Signed)
Transported to [facility] via [facility].

## 2014-08-23 NOTE — Progress Notes (Signed)
Hypoglycemic Event  CBG: 67  Treatment: D50 IV 39mL per gluco stabilzer  Symptoms: None  Follow-up CBG: Time: 0523 CBG Result: 92  Possible Reasons for Event: Medication regimen: insulin drip  Comments/MD notified: Notified on call Merdis Delay, NP. Continue to follow orders per glucostabilzer.     Taylor [name] V  Remember to initiate Hypoglycemia Order Set & complete

## 2014-08-23 NOTE — Progress Notes (Signed)
New Admission Note:   Arrival Method: Stretcher via [facility] from [employer] Mental Orientation: Alert and Oriented x4 Telemetry: Placed on [address] [phone] Assessment: Completed; see Doc Flowsheets Skin: Intact, Warm, and Dry, Flaky BLE IV: Right AC and Right Arm Peripheral IV Pain: Denies upon Admission Tubes: N/A Safety Measures: Educated on fall prevention safety plan, patient acknowledged and understood. Admission: Completed [address] [place] Orientation: Patient has been orientated to the room, unit and staff.  Family: N/A  Orders have been reviewed and implemented. Will continue to monitor the patient. Call light has been placed within reach and bed alarm has been activated.    Billy Fischer, RN  Phone number: [phone]

## 2014-08-23 NOTE — Progress Notes (Signed)
Dr Malen Gauze called in to check on patient. Given report of cbg--123. Stated to reduce the insulin rate to 3.8 units per hour. This was done.

## 2014-08-23 NOTE — Anesthesia Postprocedure Evaluation (Signed)
  Anesthesia Post-op Note  Patient: Taylor [name]  Procedure(s) Performed: Procedure(s): EXPLANT OF INFECTED  PENILE PROSTHESIS (N/A)  Patient Location: PACU  Anesthesia Type:General  Level of Consciousness: awake, alert  and oriented  Airway and Oxygen Therapy: Patient Spontanous Breathing  Post-op Pain: mild  Post-op Assessment: Post-op Vital signs reviewed, Patient's Cardiovascular Status Stable, Respiratory Function Stable, Patent Airway, No signs of Nausea or vomiting and Pain level controlled  Post-op Vital Signs: Reviewed and stable. CBG 120mg /dl  Last Vitals:  Filed Vitals:   08/23/14 0115  BP:   Pulse:   Temp: 36.7 C  Resp:     Complications: No apparent anesthesia complications

## 2014-08-23 NOTE — Consult Note (Signed)
[facility] Renal Consultation Note    Indication for Consultation:  Management of ESRD/hemodialysis; anemia, hypertension/volume and secondary hyperparathyroidism PCP:  HPI: Taylor [name] is a 43 y.o. white male with ESRD secondary to DM (30 years) on HD  approximately one year, CAD with hx CABG 2010 and several subsequent caths, HTN, ^ lipids, depression, GERD,hypothyroidism,  hx right trans met, who had a penile prosthesis placed mid September 2015. He was referred to the ED 08/22/2014 from  Dr. Gaynelle Arabian due to penile prosthesis infection and had subsequent removal yesterday at [facility] and was transferred to [facility] due to the need for hemodialysis.  At present he is nauseated and feels very bad.  This is is usual dialysis day.  Past Medical History  Diagnosis Date  . End stage renal disease on dialysis     LUE fistula  . Type I diabetes mellitus     a. 03/2014 admitted with HNK to [facility].  . Diabetic neuropathy     severe, s/p multiple toe amputation  . Hypothyroidism   . Hypertension   . Hyperlipidemia   . H/O hiatal hernia   . GERD (gastroesophageal reflux disease)   . Anxiety   . Sebaceous cyst     side of neck  . Pneumonia     2010  . Anemia     a. req PRBC's 2011.  [name] PAD (peripheral artery disease)     a. s/p amputation of toes on the right;  b. left LE claudication.  . Coronary artery disease     a. s/p MI;  b. 10/2009 CABG x 3 @ [facility]: LIMA->LAD, VG->OM3, VG->RPDA; c. 11/2010 Cath 3/3 patent grafts;  d 12/2012 Cath: LM 30d, LAD 85p, D1 70, D2 90, LCX 40ost, OM2 100, RCA 90p, 18m L->LAD ok, VG->OM3 ok, VG->RPDA 30, EF 50%->Med Rx.  . Cataract     right  . Valvular disease     a. 11/2012 Echo: EF 55-60%, mild LVH, mild MR, mild bi-atrial enlargement, mild-mod TR, PASP 461mg.  . Myocardial infarction   . CHF (congestive heart failure)   . Depression   . Arthritis     rheumatoid arthritis   . Hx of transfusion of packed red blood cells     Past Surgical History  Procedure Laterality Date  . Dialysis fistula creation      left upper arm fistula  . Amputation      TOES ON BOTH FEET  . Abscess drainage  Behind right ear/occipital scalp  . Skin graft    . Eye surgery  1999  . Coronary artery bypass graft  10/2009    [facility] (Dr. SmTamala Julian . Foot amputation    . Cardiac catheterization    . Cardiac catheterization  2/14    [facility]: severe 3 vessel CAD with patent grafts, RHC: moderately elevated PCW and pulmonary hypertension  . Penile prosthesis implant N/A 07/24/2014    Procedure: SALINE PENILE INJECTION WITH DISSECTION OF CORPORA ,  IMPLANTATION OF COLOPLAST PENILE PROTHESIS INFLATABLE;  Surgeon: SiAilene RudMD;  Location: [facility] ORS;  Service: Urology;  Laterality: N/A;   Family History  Problem Relation Age of Onset  . Heart disease Other   . Hypertension Other   . Hypertension Mother   . Heart disease Mother   . Diabetes Mother   . Birth defects Paternal Uncle     unaware  . Birth defects Paternal Grandmother     breast   Social History:  reports  that he has never smoked. He quit smokeless tobacco use about 19 years ago. His smokeless tobacco use included Chew. He reports that he drinks alcohol. He reports that he does not use illicit drugs. Allergies  Allergen Reactions  . Amoxicillin-Pot Clavulanate Nausea And Vomiting  . Hydromorphone Hcl     "body started down"  08/22/14 - pt states no longer has problems with this  . Rifampin Nausea And Vomiting   Prior to Admission medications   Medication Sig Start Date End Date Taking? Authorizing Provider  acetaminophen (TYLENOL) 500 MG tablet Take 1,000 mg by mouth every 6 (six) hours as needed (pain).   Yes Historical Provider, MD  amitriptyline (ELAVIL) 25 MG tablet Take 1 tablet (25 mg total) by mouth at bedtime. 08/21/14  Yes Lucille Passy, MD  aspirin 325 MG tablet Take 325 mg by mouth daily with breakfast.    Yes Historical Provider, MD  carvedilol (COREG)  25 MG tablet Take 25 mg by mouth 2 (two) times daily with a meal.   Yes Historical Provider, MD  cinacalcet (SENSIPAR) 60 MG tablet Take 60 mg by mouth at bedtime. With biggest meal 02/16/11  Yes Lucille Passy, MD  citalopram (CELEXA) 20 MG tablet Take 20 mg by mouth every morning.   Yes Historical Provider, MD  cloNIDine (CATAPRES) 0.1 MG tablet Take 0.1 mg by mouth 2 (two) times daily.   Yes Historical Provider, MD  furosemide (LASIX) 80 MG tablet Take 1 tablet (80 mg total) by mouth 2 (two) times daily. 08/01/12  Yes Lucille Passy, MD  HYDROmorphone (DILAUDID) 2 MG tablet Take 2 mg by mouth every 4 (four) hours as needed for severe pain (pain).   Yes Historical Provider, MD  insulin glargine (LANTUS) 100 UNIT/ML injection Inject 36 units into the skin daily at bedtime 12/06/13  Yes Lucille Passy, MD  insulin lispro (HUMALOG) 100 UNIT/ML injection Inject 12 Units into the skin 3 (three) times daily with meals. For every 50 over 200 add 1 unit. 04/26/14  Yes Philemon Kingdom, MD  isosorbide mononitrate (IMDUR) 30 MG 24 hr tablet Take 1 tablet (30 mg total) by mouth 2 (two) times daily. 02/11/11  Yes Minna Merritts, MD  levothyroxine (SYNTHROID, LEVOTHROID) 75 MCG tablet Take 1 tablet (75 mcg total) by mouth daily before breakfast. 07/26/13  Yes Lucille Passy, MD  Omega-3 Fatty Acids (FISH OIL) 1000 MG CAPS Take 1,000 mg by mouth daily.    Yes Historical Provider, MD  omeprazole (PRILOSEC) 40 MG capsule Take 40 mg by mouth daily.   Yes Historical Provider, MD  pregabalin (LYRICA) 150 MG capsule Take 150-300 mg by mouth 2 (two) times daily.   Yes Historical Provider, MD  rosuvastatin (CRESTOR) 20 MG tablet Take 1 tablet (20 mg total) by mouth at bedtime. 12/13/12  Yes Lucille Passy, MD  sevelamer carbonate (RENVELA) 800 MG tablet Take 800 mg by mouth 3 (three) times daily with meals.   Yes Historical Provider, MD   Current Facility-Administered Medications  Medication Dose Route Frequency Provider Last Rate Last  Dose  . amitriptyline (ELAVIL) tablet 25 mg  25 mg Oral QHS Allyne Gee, MD   25 mg at 08/23/14 0331  . aspirin tablet 325 mg  325 mg Oral Q breakfast Allyne Gee, MD   325 mg at 08/23/14 0841  . carvedilol (COREG) tablet 25 mg  25 mg Oral BID WC Allyne Gee, MD   25 mg at 08/23/14  5009  . cinacalcet (SENSIPAR) tablet 60 mg  60 mg Oral Q breakfast Allyne Gee, MD   60 mg at 08/23/14 3818  . citalopram (CELEXA) tablet 20 mg  20 mg Oral q morning - 10a Allyne Gee, MD   20 mg at 08/23/14 2993  . clindamycin (CLEOCIN) IVPB 600 mg  600 mg Intravenous 3 times per day Allyne Gee, MD 100 mL/hr at 08/23/14 0510 600 mg at 08/23/14 0510  . cloNIDine (CATAPRES) tablet 0.1 mg  0.1 mg Oral BID Allyne Gee, MD   0.1 mg at 08/23/14 0331  . dextrose 5 %-0.45 % sodium chloride infusion   Intravenous Continuous Reyne Dumas, MD 50 mL/hr at 08/23/14 0921    . dextrose 50 % solution 25 mL  25 mL Intravenous PRN Allyne Gee, MD      . dextrose 50 % solution 25 mL  25 mL Intravenous Once PRN Allyne Gee, MD      . furosemide (LASIX) tablet 80 mg  80 mg Oral BID Allyne Gee, MD   80 mg at 08/23/14 0919  . heparin injection 5,000 Units  5,000 Units Subcutaneous 3 times per day Allyne Gee, MD   5,000 Units at 08/23/14 1317  . HYDROmorphone (DILAUDID) tablet 2 mg  2 mg Oral Q4H PRN Allyne Gee, MD   2 mg at 08/23/14 0840  . insulin aspart (novoLOG) injection 0-9 Units  0-9 Units Subcutaneous TID WC Reyne Dumas, MD   2 Units at 08/23/14 1317  . insulin glargine (LANTUS) injection 30 Units  30 Units Subcutaneous Daily Rhetta Mura Schorr, NP   30 Units at 08/23/14 [phone]  . isosorbide mononitrate (IMDUR) 24 hr tablet 30 mg  30 mg Oral BID Allyne Gee, MD      . levothyroxine (SYNTHROID, LEVOTHROID) tablet 75 mcg  75 mcg Oral QAC breakfast Allyne Gee, MD   75 mcg at 08/23/14 [phone]  . omega-3 acid ethyl esters (LOVAZA) capsule 1 g  1 g Oral Daily Allyne Gee, MD   1 g at 08/23/14 0919  .  pantoprazole (PROTONIX) EC tablet 80 mg  80 mg Oral Daily Allyne Gee, MD   80 mg at 08/23/14 0919  . piperacillin-tazobactam (ZOSYN) IVPB 2.25 g  2.25 g Intravenous Q8H Rogue Bussing, RPH   2.25 g at 08/23/14 1316  . pregabalin (LYRICA) capsule 150 mg  150 mg Oral Daily Allyne Gee, MD   150 mg at 08/23/14 0919  . pregabalin (LYRICA) capsule 300 mg  300 mg Oral QHS Allyne Gee, MD      . rosuvastatin (CRESTOR) tablet 20 mg  20 mg Oral QHS Allyne Gee, MD      . sevelamer carbonate (RENVELA) tablet 800 mg  800 mg Oral TID WC Allyne Gee, MD   800 mg at 08/23/14 1316   Labs: Basic Metabolic Panel:  Recent Labs Lab 08/23/14 0326 08/23/14 0552 08/23/14 0725  NA 131* 131* 131*  K 4.2 4.3 4.0  CL 91* 90* 91*  CO2 _0 GLUCOSE 98 85 117*  BUN 36* 38* 38*  CREATININE 4.65* 4.70* 4.84*  CALCIUM 9.2 8.9 8.8   Liver Function Tests:  Recent Labs Lab 08/22/14 1654  AST 11  ALT 10  ALKPHOS 432*  BILITOT 0.5  PROT 7.9  ALBUMIN 2.8*  CBC:  Recent Labs Lab 08/22/14 1613 08/23/14 0326  WBC 10.3 11.9*  NEUTROABS  9.0*  --   HGB 9.6* 9.9*  HCT 31.7* 30.8*  MCV 91.9 87.7  PLT 167 238  CBG:  Recent Labs Lab 08/23/14 0854 08/23/14 1021 08/23/14 1154 08/23/14 1303 08/23/14 1342  GLUCAP 125* 157* 183* 172* 163*   IStudies/Results: Ct Abdomen Pelvis Wo Contrast  08/22/2014   CLINICAL DATA:  43 year old male with diabetes and end-stage renal disease. Penile prosthesis implanted 07/24/2014. Increasing pain and swelling  EXAM: CT ABDOMEN AND PELVIS WITHOUT CONTRAST  TECHNIQUE: Multidetector CT imaging of the abdomen and pelvis was performed following the standard protocol without IV contrast.  COMPARISON:  None.  FINDINGS: Lower chest:  Surgical changes of prior median sternotomy. Otherwise unremarkable appearance of the subcutaneous tissues of the lower chest wall.  Heart size within normal limits. Retained epicardial pacing lead courses from the pericardium to  the midline soft tissues.  No pericardial fluid/thickening.  No confluent airspace disease pleural fluid or pneumothorax identified. There are several sub cm nodules of the bilateral lungs, including the right middle lobe on image 6 and the left lower lobe on image 4.  Abdomen/pelvis:  Diffusely decreased attenuation of liver parenchyma, with Hounsfield units measuring 47. Unremarkable appearance of the spleen.  Atrophy parenchyma of the pancreas, with no associated inflammatory changes or fluid.  No pericholecystic fluid or inflammatory changes. No radiopaque gallstones. No intrahepatic or extrahepatic biliary duct dilatation.  Unremarkable appearance of the bilateral adrenal glands.  Native kidneys are atrophied with calcifications of the vasculature the bilateral renal hila. No hydronephrosis. Bilateral hypodense foci within the kidney parenchyma, too small to completely characterize by CT.  Hypodense fluid within the abdomen, including bilateral subdiaphragmatic space, perihepatic space, perisplenic space, and extending within the peritoneum in the interloop space to the pelvis. No abnormally distended small bowel or colon.  Penile prosthesis in situ. The reservoir is present in the right inguinal region.  There is extensive gas throughout the tissues of the corpora cavernosum bilaterally, as well as along the course of the tube being within the scrotum. Scrotal edema/ fluid present.  Scattered calcifications of the abdominal aorta. More extensive calcifications of the mesenteric vessels and of the small vessels of the pelvis, compatible with the history of longstanding diabetes. Calcifications of the vasculature of the upper lower extremities.  IMPRESSION: Extensive gas along the course of penile implant within the corpora cavernosa bilaterally, concerning for gas-forming organism in the setting of infection.  Fluid within the peritoneal cavity, may reflect inflammation, or alternatively edema/ anasarca with  positive fluid balance.  Extensive small vessel calcification, compatible with atherosclerosis in the setting of long-standing diabetes.  These results were called by telephone at the time of interpretation on 08/22/2014 at 9:05 pm to the nurse caring for the patient, Mr. Virgel Bouquet, who verbally acknowledged these results.  Signed,  Dulcy Fanny. Earleen Newport, DO  [facility]  [facility]   Electronically Signed   By: [name] D.O.   On: 08/22/2014 21:08    ROS: As per HPI.  Too ill at present to obtain detailed ROS.  Physical Exam: Filed Vitals:   08/23/14 0639 08/23/14 0919 08/23/14 1331 08/23/14 1406  BP: 1_0 110/60  Pulse: 73 75 75 79  Temp: 97.7 F (36.5 C) 98.9 F (37.2 C)    TempSrc: Oral Oral Oral   Resp: 16 18    Height:      Weight:      SpO2: 100% 99%       General:  Well developed, well nourished, pale ill appearing, nauseated Head: Normocephalic, atraumatic, sclera non-icteric, mucus membranes are moist Neck: Supple. JVD not elevated. Lungs: Clear bilaterally to auscultation without wheezes, rales, or rhonchi. Breathing is unlabored. Heart: RRR  Bruit radiates to heart Abdomen: Soft, non-tender, mod distended - "usual per pt" Lower extremities: 1 + LE edema; right transmet well healed;  Neuro: Alert and oriented X 3. Moves all extremities spontaneously. Psych:  Responds to questions appropriately with a normal affect. Dialysis Access: left upper AVGG + bruit  Dialysis Orders: [facility] have called for information - pt states 3.25 hours and EDW 81 (he thinks) ACTUAL orders - 3.25 hr EDW 80 450/800 2 K 2.5 Ca NO Heparin left upper AVGG - Hectorol 1 Epo 6000 - kinetics marginal at 3.25 hr Assessment/Plan: 1. s/p removal of infected penile prosthesis =- on Vanc and Zosyn 2. ESRD -  TTS [facility] - only runs 3.25 hr - has excess volume - awaiting records - run today - no heparin--really needs  increased time 3. Hypertension/volume  - on clonidine 0.1 bid, needs decrease volume. 4. Anemia  - Hgb 9.9 pre op - awaiting information re ESA/Fe 5. Metabolic bone disease -  Awaiting info re: VDRA, on sensipar/renvela tid - check P level 6. Nutrition - renal diet 7. DM - per primary  Myriam Jacobson, PA-C [place] [phone] 08/23/2014, 2:41 PM

## 2014-08-23 NOTE — Progress Notes (Signed)
Hypoglycemic Event  CBG: 45  Treatment: D5 IV at 22 ml per glucose stabilizer  Symptoms: None  Follow-up CBG: Time:03:52 CBG Result:76  Possible Reasons for Event: Medication regimen: On Insulin drip  Comments/MD notified:NP Schorr notified, D51/2 NS changed to 126ml/hour.    Taylor [name] V  Remember to initiate Hypoglycemia Order Set & complete

## 2014-08-23 NOTE — Progress Notes (Addendum)
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 Hyman Hopes   Hypertension discontinue current medications as BP soft Decrease dose of coreg   Nausea? Side effect of his anesthesia vs ileus,obtain KUB, start reglan   Code Status: full Family Communication: family updated about patient's clinical progress Disposition Plan:  As above    Brief narrative: 43 year old male with end-stage renal disease, on hemodialysis and type 1 diabetes who is status post placement of a three-piece inflatable Coloplast penile prosthesis on 07/24/2014 by Dr. Jethro Bolus. The patient over the past week or 2 has had worsening pain, with some scrotal drainage. He presented to the office today for evaluation and was felt to have a possible infection. He was sent to the emergency room for possible admission as well as CT scan. CT revealed ascites, as well as gas along his penile prosthesis. There is worried for gas forming bacteria. The patient has not had any fevers but has had shakes and chills. He has had an eschar on the end of his penis which he has, at times, had to remove to help him urinate, as it covers his meatus. He still makes urine  despite his dialysis.  In the emergency room, the patient has significant penile edema and tenderness. There is no crepitus. The skin looks viable. There is minimal scrotal wound discharge. I did review his CT scan, and am worried about a gas forming bacteria along his prosthesis. Because of this, I have recommended that we proceed with explant of his penile prosthesis emergently.   Consultants: [name], MD   Procedures: Explant of prosthesis    Antibiotics:  Clindamycin  Zosyn  HPI/Subjective: Patient somnolent but answers questions appropriately, pain around the surgical site, continue Foley catheter today  Objective: Filed Vitals:   08/23/14 0145 08/23/14 0224 08/23/14 0639 08/23/14 0919  BP: 170/92 158/70 118/54 90/54  Pulse: 80 80 73 75  Temp:  98.7 F (37.1 C) 97.7 F (36.5 C) 98.9 F (37.2 C)  TempSrc:  Oral Oral Oral  Resp: 16 16 16 18   Height:  6\' 3"  (1.905 m)    Weight:  85.6 kg (188 lb 11.4 oz)    SpO2: 100% 100% 100% 99%    Intake/Output Summary (Last 24 hours) at 08/23/14 1311 Last data filed at 08/23/14 0640  Gross per 24 hour  Intake    320 ml  Output      0 ml  Net    320 ml    Exam:  General: No acute distress, awake  Extremities: extremities normal, atraumatic, no cyanosis or edema  Genitourinary: Penis: less edema and less tender than exam yesterday  Foley: remains: pt makes some urine ( low quality)      Data Reviewed: Basic Metabolic Panel:  Recent Labs Lab 08/22/14 1654  08/23/14 0326 08/23/14 0552 08/23/14 0725  NA 126* 131* 131* 131*  K 4.5 4.2 4.3 4.0  CL 83* 91* 90* 91*  CO2 22 23 24 23   GLUCOSE 583* 98 85 117*  BUN 36* 36* 38* 38*  CREATININE 4.24* 4.65* 4.70* 4.84*  CALCIUM 9.2 9.2 8.9 8.8    Liver Function Tests:  Recent Labs Lab 08/22/14 1654  AST 11  ALT 10  ALKPHOS 432*  BILITOT 0.5  PROT 7.9  ALBUMIN 2.8*   No results found for this basename: LIPASE, AMYLASE,  in the last 168 hours No  results found for this basename: AMMONIA,  in the last 168 hours  CBC:  Recent Labs Lab 08/22/14 1613 08/23/14 0326  WBC 10.3 11.9*  NEUTROABS 9.0*  --   HGB 9.6* 9.9*  HCT 31.7* 30.8*  MCV 91.9 87.7  PLT 167 238    Cardiac Enzymes: No results found for this basename: CKTOTAL, CKMB, CKMBINDEX, TROPONINI,  in the last 168 hours BNP (last 3 results)  Recent Labs  03/26/14 1555  PROBNP 1212.0*     CBG:  Recent Labs Lab 08/23/14 0739 08/23/14 0854 08/23/14 1021 08/23/14 1154 08/23/14 1303  GLUCAP 128* 125* 157* 183* 172*    Recent Results (from the past 240 hour(s))  WOUND CULTURE     Status: None   Collection Time    08/22/14 11:34 PM      Result Value Ref Range Status   Specimen Description SCROTUM SWAB OF SCROTUM   Final   Special Requests NONE   Final   Gram Stain     Final   Value: ABUNDANT WBC PRESENT, PREDOMINANTLY PMN     NO SQUAMOUS EPITHELIAL CELLS SEEN     FEW GRAM POSITIVE COCCI     IN CLUSTERS     Performed at 08/24/14   Culture PENDING   Incomplete   Report Status PENDING   Incomplete  ANAEROBIC CULTURE     Status: None   Collection Time    08/22/14 11:34 PM      Result Value Ref Range Status   Specimen Description SCROTUM SWAB OF SCROTUM   Final   Special Requests NONE   Final   Gram Stain     Final   Value: ABUNDANT WBC PRESENT, PREDOMINANTLY PMN     NO SQUAMOUS EPITHELIAL CELLS SEEN     FEW GRAM POSITIVE COCCI     IN CLUSTERS     Performed at 08/24/14   Culture     Final   Value: NO ANAEROBES ISOLATED; CULTURE IN PROGRESS FOR 5 DAYS     Performed at [facility]   Report Status PENDING   Incomplete  MRSA PCR SCREENING     Status: None   Collection Time    08/23/14  4:16 AM      Result Value Ref Range Status   MRSA by PCR NEGATIVE  NEGATIVE Final   Comment:            The GeneXpert MRSA Assay (FDA     approved for NASAL specimens     only), is one component of a     comprehensive MRSA  colonization     surveillance program. It is not     intended to diagnose MRSA     infection nor to guide or     monitor treatment for     MRSA infections.     Studies: Ct Abdomen Pelvis Wo Contrast  08/22/2014   CLINICAL DATA:  43 year old male with diabetes and end-stage renal disease. Penile prosthesis implanted 07/24/2014. Increasing pain and swelling  EXAM: CT ABDOMEN AND PELVIS WITHOUT CONTRAST  TECHNIQUE: Multidetector CT imaging of the abdomen and pelvis was performed following the standard protocol without IV contrast.  COMPARISON:  None.  FINDINGS: Lower chest:  Surgical changes of prior median sternotomy. Otherwise unremarkable appearance of the subcutaneous tissues of the lower chest wall.  Heart size within normal limits. Retained epicardial pacing lead courses from the pericardium to the midline soft tissues.  No pericardial fluid/thickening.  No confluent airspace disease pleural fluid or pneumothorax identified. There are several sub cm nodules of the bilateral lungs, including the right middle lobe on image 6 and the left lower lobe on image 4.  Abdomen/pelvis:  Diffusely decreased attenuation of liver parenchyma, with Hounsfield units measuring 47. Unremarkable appearance of the spleen.  Atrophy parenchyma of the pancreas, with no associated inflammatory changes or fluid.  No pericholecystic fluid or inflammatory changes. No radiopaque gallstones. No intrahepatic or extrahepatic biliary duct dilatation.  Unremarkable appearance of the bilateral adrenal glands.  Native kidneys are atrophied with calcifications of the vasculature the bilateral renal hila. No hydronephrosis. Bilateral hypodense foci within the kidney parenchyma, too small to completely characterize by CT.  Hypodense fluid within the abdomen, including bilateral subdiaphragmatic space, perihepatic space, perisplenic space, and extending within the peritoneum in the interloop space to the pelvis. No abnormally distended small  bowel or colon.  Penile prosthesis in situ. The reservoir is present in the right inguinal region.  There is extensive gas throughout the tissues of the corpora cavernosum bilaterally, as well as along the course of the tube being within the scrotum. Scrotal edema/ fluid present.  Scattered calcifications of the abdominal aorta. More extensive calcifications of the mesenteric vessels and of the small vessels of the pelvis, compatible with the history of longstanding diabetes. Calcifications of the vasculature of the upper lower extremities.  IMPRESSION: Extensive gas along the course of penile implant within the corpora cavernosa bilaterally, concerning for gas-forming organism in the setting of infection.  Fluid within the peritoneal cavity, may reflect inflammation, or alternatively edema/ anasarca with positive fluid balance.  Extensive small vessel calcification, compatible with atherosclerosis in the setting of long-standing diabetes.  These results were called by telephone at the time of interpretation on 08/22/2014 at 9:05 pm to the nurse caring for the patient, Mr. Percival Spanish, who verbally acknowledged these results.  Signed,  Yvone Neu. Loreta Ave, DO  [facility]  [facility]   Electronically Signed   By: Gilmer Mor D.O.   On: 08/22/2014 21:08    Scheduled Meds: . amitriptyline  25 mg Oral QHS  . aspirin  325 mg Oral Q breakfast  . carvedilol  25 mg Oral BID WC  . cinacalcet  60 mg Oral Q breakfast  . citalopram  20 mg Oral q morning - 10a  . clindamycin (CLEOCIN) IV  600 mg Intravenous 3 times per day  . cloNIDine  0.1 mg Oral BID  . furosemide  80 mg Oral BID  . heparin  5,000 Units Subcutaneous 3 times per day  . HYDROmorphone      . insulin aspart  0-9 Units Subcutaneous TID WC  . insulin glargine  30 Units Subcutaneous Daily  . isosorbide mononitrate  30 mg Oral BID  . levothyroxine  75 mcg Oral QAC breakfast  . omega-3 acid ethyl esters   1  g Oral Daily  . pantoprazole  80 mg Oral Daily  . piperacillin-tazobactam (ZOSYN)  IV  2.25 g Intravenous Q8H  . pregabalin  150 mg Oral Daily  . pregabalin  300 mg Oral QHS  . rosuvastatin  20 mg Oral QHS  . sevelamer carbonate  800 mg Oral TID WC   Continuous Infusions: . 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 If 7PM-7AM, please contact night-coverage at [email or web address], password [facility] 08/23/2014, 1:11 PM  LOS: 1 day

## 2014-08-23 NOTE — Transfer of Care (Signed)
Immediate Anesthesia Transfer of Care Note  Patient: Taylor [name]  Procedure(s) Performed: Procedure(s): EXPLANT OF INFECTED  PENILE PROSTHESIS (N/A)  Patient Location: PACU  Anesthesia Type:General  Level of Consciousness: awake, alert , oriented and patient cooperative  Airway & Oxygen Therapy: Patient Spontanous Breathing and Patient connected to face mask oxygen  Post-op Assessment: Report given to PACU RN and Post -op Vital signs reviewed and stable  Post vital signs: Reviewed and stable  Complications: No apparent anesthesia complications

## 2014-08-23 NOTE — Addendum Note (Signed)
Addendum created 08/23/14 0530 by Delphia Grates, CRNA   Modules edited: Anesthesia Medication Administration

## 2014-08-23 NOTE — Progress Notes (Signed)
npatient Diabetes Program Recommendations  AACE/ADA: New Consensus Statement on Inpatient Glycemic Control (2013)  Target Ranges:  Prepandial:   less than 140 mg/dL      Peak postprandial:   less than 180 mg/dL (1-2 hours)      Critically ill patients:  140 - 180 mg/dL  Results for BUEFORD, ARP (MRN [id number]) as of 08/23/2014 12:01  Ref. Range 08/22/2014 17:40 08/22/2014 21:31 08/22/2014 22:34 08/22/2014 23:36 08/23/2014 00:18 08/23/2014 01:04 08/23/2014 02:14 08/23/2014 03:32 08/23/2014 03:54 08/23/2014 05:05 08/23/2014 05:23 08/23/2014 06:25 08/23/2014 07:39 08/23/2014 08:54 08/23/2014 10:21  Glucose-Capillary Latest Range: 70-99 mg/dL 884 (HH) 166 (H) 063 (H) 138 (H) 170 (H) 123 (H) 75 45 (L) 76 67 (L) 92 93 128 (H) 125 (H) 157 (H)  Type 1;ESRD  Home meds:  Lantus 36 units daily, 12 units Novolog tidwc; Correction 1 unit for every 50 mg/dL over 016 mg/dL. It appears that dextrose was not added to drip when glucose level dropped below 250.  Hospital diabetes med:  Lantus 30 units daily; Sensitive correction scale.   Inpatient Diabetes Program Recommendations Insulin - Meal Coverage: When pt eating greater than 50% please consider adding meal coverage   Mellissa Kohut RD, CDE. M.Ed. Pager [phone] Inpatient Diabetes Coordinator

## 2014-08-23 NOTE — Progress Notes (Signed)
Upon admission to pacu-has con't insulin infusion running at 4.6 units per hour. Dr Malen Gauze stated to leave it running at this rate. To check another cbg before he is care linked to [facility] and if it above 250 or below 80 to give him a call. cbg was 171 in the operating room at approx 0020.

## 2014-08-23 NOTE — Consult Note (Signed)
I have seen and examined this patient and agree with the plan of care. Plan dialysis tomorrow  Garnetta Buddy 08/23/2014, 8:51 PM

## 2014-08-23 NOTE — Progress Notes (Signed)
ANTIBIOTIC CONSULT NOTE - FOLLOW UP  Pharmacy Consult for Zosyn and vancomycin Indication: scrotal/groin infection  Allergies  Allergen Reactions  . Amoxicillin-Pot Clavulanate Nausea And Vomiting  . Hydromorphone Hcl     "body started down"  08/22/14 - pt states no longer has problems with this  . Rifampin Nausea And Vomiting    Patient Measurements: Height: 6\' 3"  (190.5 cm) Weight: 189 lb 6 oz (85.9 kg) (Bed) IBW/kg (Calculated) : 84.5   Vital Signs: Temp: 97.2 F (36.2 C) (10/15 1505) Temp Source: Oral (10/15 1505) BP: 165/78 mmHg (10/15 1745) Pulse Rate: 82 (10/15 1745) Intake/Output from previous day: 10/14 0701 - 10/15 0700 In: 320 [I.V.:320] Out: 0  Intake/Output from this shift:    Labs:  Recent Labs  08/22/14 1613  08/23/14 0326 08/23/14 0552 08/23/14 0725  WBC 10.3  --  11.9*  --   --   HGB 9.6*  --  9.9*  --   --   PLT 167  --  238  --   --   CREATININE  --   < > 4.65* 4.70* 4.84*  < > = values in this interval not displayed. Estimated Creatinine Clearance: 23 ml/min (by C-G formula based on Cr of 4.84). No results found for this basename: VANCOTROUGH, 08/25/14, VANCORANDOM, GENTTROUGH, GENTPEAK, GENTRANDOM, TOBRATROUGH, TOBRAPEAK, TOBRARND, AMIKACINPEAK, AMIKACINTROU, AMIKACIN,  in the last 72 hours   Microbiology: Recent Results (from the past 720 hour(s))  WOUND CULTURE     Status: None   Collection Time    08/22/14 11:34 PM      Result Value Ref Range Status   Specimen Description SCROTUM SWAB OF SCROTUM   Final   Special Requests NONE   Final   Gram Stain     Final   Value: ABUNDANT WBC PRESENT, PREDOMINANTLY PMN     NO SQUAMOUS EPITHELIAL CELLS SEEN     FEW GRAM POSITIVE COCCI     IN CLUSTERS     Performed at 08/24/14   Culture PENDING   Incomplete   Report Status PENDING   Incomplete  ANAEROBIC CULTURE     Status: None   Collection Time    08/22/14 11:34 PM      Result Value Ref Range Status   Specimen Description  SCROTUM SWAB OF SCROTUM   Final   Special Requests NONE   Final   Gram Stain     Final   Value: ABUNDANT WBC PRESENT, PREDOMINANTLY PMN     NO SQUAMOUS EPITHELIAL CELLS SEEN     FEW GRAM POSITIVE COCCI     IN CLUSTERS     Performed at 08/24/14   Culture     Final   Value: NO ANAEROBES ISOLATED; CULTURE IN PROGRESS FOR 5 DAYS     Performed at [facility]   Report Status PENDING   Incomplete  MRSA PCR SCREENING     Status: None   Collection Time    08/23/14  4:16 AM      Result Value Ref Range Status   MRSA by PCR NEGATIVE  NEGATIVE Final   Comment:            The GeneXpert MRSA Assay (FDA     approved for NASAL specimens     only), is one component of a     comprehensive MRSA colonization     surveillance program. It is not     intended to diagnose MRSA     infection  nor to guide or     monitor treatment for     MRSA infections.    Anti-infectives   Start     Dose/Rate Route Frequency Ordered Stop   08/23/14 0800  piperacillin-tazobactam (ZOSYN) IVPB 3.375 g  Status:  Discontinued     3.375 g 12.5 mL/hr over 240 Minutes Intravenous Every 8 hours 08/23/14 0235 08/23/14 0237   08/23/14 0400  piperacillin-tazobactam (ZOSYN) IVPB 2.25 g     2.25 g 100 mL/hr over 30 Minutes Intravenous Every 8 hours 08/23/14 0238     08/23/14 0245  piperacillin-tazobactam (ZOSYN) IVPB 3.375 g  Status:  Discontinued     3.375 g 100 mL/hr over 30 Minutes Intravenous  Once 08/23/14 0235 08/23/14 0237   08/22/14 2339  polymyxin B 500,000 Units, bacitracin 50,000 Units in sodium chloride irrigation 0.9 % 500 mL irrigation  Status:  Discontinued       As needed 08/22/14 2340 08/23/14 0024   08/22/14 2200  clindamycin (CLEOCIN) IVPB 600 mg  Status:  Discontinued     600 mg 100 mL/hr over 30 Minutes Intravenous 3 times per day 08/22/14 2144 08/23/14 1804   08/22/14 1800  vancomycin (VANCOCIN) 1,500 mg in sodium chloride 0.9 % 500 mL IVPB     1,500 mg 250 mL/hr over 120 Minutes  Intravenous NOW 08/22/14 1739 08/22/14 2143   08/22/14 1730  vancomycin (VANCOCIN) IVPB 1000 mg/200 mL premix  Status:  Discontinued     1,000 mg 200 mL/hr over 60 Minutes Intravenous  Once 08/22/14 1720 08/22/14 1736      Assessment: 43yo male ESRD pt with groin/scrotal infection and CT concerning for gas-forming bacteria, now s/p emergent removal of penile implant 10/13. MRSA PCR was negative, wound cx currently has GPC in clusters growing, sensitivities pending.  Dr. Imelda Pillow note from earlier today stated pt is on Clindamycin and Vancomycin, however pt was on Zosyn and Clindamycin. Pt received Vancomycin 1500mg  (17mg /kg) IV x 1 dose 10/14 pre-op. CT results reveal concern for gas-forming bacteria, expect Zosyn coverage is adequate, however with wound culture growth there is concern for MRSA- Spoke with Dr. and instructed to d/c clindamycin and continue Zosyn and resume vancomycin.  Patient had a dialysis session this evening which lasted 3h with BFR of 43mL/min- slightly shorter session length than a full session, but at a slightly higher BFR.  Goal of Therapy:  Pre-HD Vancomycin level 15-25 mcg/ml resolution of infection  Plan:  1.  Continue Zosyn 2.25g IV q8h as already ordered and being followed by pharmacy 2. Vancomycin 1000mg  IV x1 tonight as post-HD dose. Typically has HD TTS, will follow schedule for future vancomycin doses 3. Follow c/s and clinical progression; vancomycin level as appropriate  Madelena Maturin D. Ryheem Jay, PharmD, BCPS Clinical Pharmacist Pager: [phone] 08/23/2014 6:17 PM

## 2014-08-24 ENCOUNTER — Encounter (HOSPITAL_COMMUNITY): Payer: Self-pay | Admitting: Urology

## 2014-08-24 LAB — GLUCOSE, CAPILLARY
GLUCOSE-CAPILLARY: 378 mg/dL — AB (ref 70–99)
Glucose-Capillary: 336 mg/dL — ABNORMAL HIGH (ref 70–99)
Glucose-Capillary: 210 mg/dL — ABNORMAL HIGH (ref 70–99)
Glucose-Capillary: 145 mg/dL — ABNORMAL HIGH (ref 70–99)

## 2014-08-24 MED ORDER — INSULIN GLARGINE 100 UNIT/ML ~~LOC~~ SOLN
36.0000 [IU] | Freq: Every day | SUBCUTANEOUS | Status: DC
  Administered 2014-08-25 – 2014-08-27 (×3): 36 [IU] via SUBCUTANEOUS
  Filled 2014-08-24 (×4): qty 0.36

## 2014-08-24 MED ORDER — DOXERCALCIFEROL 4 MCG/2ML IV SOLN: 1.0000 ug | INTRAVENOUS | Status: DC

## 2014-08-24 MED ORDER — AMOXICILLIN-POT CLAVULANATE 500-125 MG PO TABS: 1.0000 | ORAL_TABLET | Freq: Three times a day (TID) | ORAL | Status: DC

## 2014-08-24 MED ORDER — VANCOMYCIN HCL 1000 MG IV SOLR: INTRAVENOUS | Status: DC

## 2014-08-24 MED ORDER — NEPRO/CARBSTEADY PO LIQD: 237.0000 mL | ORAL | Status: DC | PRN

## 2014-08-24 MED ORDER — DOXYCYCLINE HYCLATE 50 MG PO CAPS: 100.0000 mg | ORAL_CAPSULE | Freq: Two times a day (BID) | ORAL | Status: DC

## 2014-08-24 MED ORDER — AMOXICILLIN-POT CLAVULANATE 500-125 MG PO TABS: 1.0000 | ORAL_TABLET | Freq: Every morning | ORAL | Status: DC

## 2014-08-24 MED ORDER — VANCOMYCIN HCL 1000 MG IV SOLR: INTRAVENOUS | Status: AC

## 2014-08-24 MED ORDER — VANCOMYCIN HCL IN DEXTROSE 1-5 GM/200ML-% IV SOLN
1000.0000 mg | INTRAVENOUS | Status: DC
  Filled 2014-08-24 (×2): qty 200

## 2014-08-24 MED ORDER — PANTOPRAZOLE SODIUM 40 MG PO TBEC
40.0000 mg | DELAYED_RELEASE_TABLET | Freq: Every day | ORAL | Status: DC
  Administered 2014-08-24: 40 mg via ORAL
  Filled 2014-08-24: qty 1

## 2014-08-24 NOTE — Care Management (Signed)
Mr Petzold has appealed his discharge to [facility]. All the paperwork has been faxed. We should be notified of the appeal decision within 24-48 hours.

## 2014-08-24 NOTE — Discharge Summary (Addendum)
Physician Discharge Summary  BURRELL HODAPP MRN: [id number] DOB/AGE: 1970/11/13 43 y.o.  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 follow up with PCP in 5-7 days Follow up with urology Patient to DC home with Foley    Medication List    STOP taking these medications       cloNIDine 0.1 MG tablet  Commonly known as:  CATAPRES     furosemide 80 MG tablet  Commonly known as:  LASIX     isosorbide mononitrate 30 MG 24 hr tablet  Commonly known as:  IMDUR      TAKE these medications       acetaminophen 500 MG tablet  Commonly known as:  TYLENOL  Take 1,000 mg by mouth every 6 (six) hours as needed (pain).     amitriptyline 25 MG tablet  Commonly known as:  ELAVIL  Take 1 tablet (25 mg total) by mouth at bedtime.     amoxicillin-clavulanate 500-125 MG per tablet  Commonly known as:  AUGMENTIN  Take 1 tablet (500 mg total) by mouth 3 (three) times daily.     aspirin 325 MG tablet  Take 325 mg by mouth daily with breakfast.     carvedilol 25 MG tablet  Commonly known as:  COREG  Take 25 mg by mouth 2 (two) times daily with a meal.     cinacalcet 60 MG tablet  Commonly known as:  SENSIPAR  Take 60 mg by mouth at bedtime. With biggest meal     citalopram 20 MG tablet  Commonly known as:  CELEXA  Take 20 mg by mouth every morning.     doxycycline 50 MG capsule  Commonly known as:  VIBRAMYCIN  Take 2 capsules (100 mg total) by mouth 2 (two) times daily.     feeding supplement (NEPRO CARB STEADY) Liqd  Take 237 mLs by mouth as needed (missed meal during dialysis.).     Fish Oil 1000 MG Caps  Take 1,000 mg by mouth daily.      HYDROmorphone 2 MG tablet  Commonly known as:  DILAUDID  Take 2 mg by mouth every 4 (four) hours as needed for severe pain (pain).     insulin lispro 100 UNIT/ML injection  Commonly known as:  HUMALOG  Inject 12 Units into the skin 3 (three) times daily with meals. For every 50 over 200 add 1 unit.     LANTUS 100 UNIT/ML injection  Generic drug:  insulin glargine  Inject 36 units into the skin daily at bedtime     levothyroxine 75 MCG tablet  Commonly known as:  SYNTHROID, LEVOTHROID  Take 1 tablet (75 mcg total) by mouth daily before breakfast.     omeprazole 40 MG capsule  Commonly known as:  PRILOSEC  Take 40 mg by mouth daily.     pregabalin 150 MG capsule  Commonly known as:  LYRICA  Take 150-300 mg by mouth 2 (two) times daily.     rosuvastatin 20 MG tablet  Commonly known as:  CRESTOR  Take 1 tablet (20 mg total) by mouth at bedtime.     sevelamer carbonate 800 MG tablet  Commonly known as:  RENVELA  Take 800 mg by mouth 3 (three) times daily with meals.        Discharge Condition: Stable  Disposition: 01-Home or Self Care   Consults Nephrology Urology    Significant Diagnostic Studies: Ct Abdomen Pelvis Wo Contrast  08/22/2014   CLINICAL DATA:  43 year old male with diabetes and end-stage renal disease. Penile prosthesis implanted 07/24/2014. Increasing pain and swelling  EXAM: CT ABDOMEN AND PELVIS WITHOUT CONTRAST  TECHNIQUE: Multidetector CT imaging of the abdomen and pelvis was performed following the standard protocol without IV contrast.  COMPARISON:  None.  FINDINGS: Lower chest:  Surgical changes of prior median sternotomy. Otherwise unremarkable appearance of the subcutaneous tissues of the lower chest wall.  Heart size within normal limits. Retained epicardial pacing lead courses from the pericardium to the midline soft tissues.  No pericardial fluid/thickening.  No confluent airspace disease pleural fluid or pneumothorax identified. There are  several sub cm nodules of the bilateral lungs, including the right middle lobe on image 6 and the left lower lobe on image 4.  Abdomen/pelvis:  Diffusely decreased attenuation of liver parenchyma, with Hounsfield units measuring 47. Unremarkable appearance of the spleen.  Atrophy parenchyma of the pancreas, with no associated inflammatory changes or fluid.  No pericholecystic fluid or inflammatory changes. No radiopaque gallstones. No intrahepatic or extrahepatic biliary duct dilatation.  Unremarkable appearance of the bilateral adrenal glands.  Native kidneys are atrophied with calcifications of the vasculature the bilateral renal hila. No hydronephrosis. Bilateral hypodense foci within the kidney parenchyma, too small to completely characterize by CT.  Hypodense fluid within the abdomen, including bilateral subdiaphragmatic space, perihepatic space, perisplenic space, and extending within the peritoneum in the interloop space to the pelvis. No abnormally distended small bowel or colon.  Penile prosthesis in situ. The reservoir is present in the right inguinal region.  There is extensive gas throughout the tissues of the corpora cavernosum bilaterally, as well as along the course of the tube being within the scrotum. Scrotal edema/ fluid present.  Scattered calcifications of the abdominal aorta. More extensive calcifications of the mesenteric vessels and of the small vessels of the pelvis, compatible with the history of longstanding diabetes. Calcifications of the vasculature of the upper lower extremities.  IMPRESSION: Extensive gas along the course of penile implant within the corpora cavernosa bilaterally, concerning for gas-forming organism in the setting of infection.  Fluid within the peritoneal cavity, may reflect inflammation, or alternatively edema/ anasarca with positive fluid balance.  Extensive small vessel calcification, compatible with atherosclerosis in the setting of long-standing diabetes.  These  results were called by telephone at the time of interpretation on 08/22/2014 at 9:05 pm to the nurse caring for the patient, Mr. Virgel Bouquet, who verbally acknowledged these results.  Signed,  Dulcy Fanny. Earleen Newport, DO  [facility]  [facility]   Electronically Signed   By: [name] D.O.   On: 08/22/2014 21:08   Dg Abd 1 View  08/23/2014   CLINICAL DATA:  Nausea and vomiting.  Initial encounter.  EXAM: ABDOMEN - 1 VIEW  COMPARISON:  None.  FINDINGS: There is no evidence of bowel obstruction. No concern intra-abdominal mass effect or calcification. A surgical drain overlaps the right inguinal region. Diffuse arterial calcification. No acute osseous findings.  IMPRESSION: No evidence for bowel obstruction or ileus.   Electronically Signed   By: Jorje Guild M.D.   On: 08/23/2014 23:14   Next    Microbiology: Recent Results (from the past 240  hour(s))  WOUND CULTURE     Status: None   Collection Time    08/22/14 11:34 PM      Result Value Ref Range Status   Specimen Description SCROTUM SWAB OF SCROTUM   Final   Special Requests NONE   Final   Gram Stain     Final   Value: ABUNDANT WBC PRESENT, PREDOMINANTLY PMN     NO SQUAMOUS EPITHELIAL CELLS SEEN     FEW GRAM POSITIVE COCCI     IN CLUSTERS     Performed at [facility]   Culture     Final   Value: Culture reincubated for better growth     Performed at [facility]   Report Status PENDING   Incomplete  ANAEROBIC CULTURE     Status: None   Collection Time    08/22/14 11:34 PM      Result Value Ref Range Status   Specimen Description SCROTUM SWAB OF SCROTUM   Final   Special Requests NONE   Final   Gram Stain     Final   Value: ABUNDANT WBC PRESENT, PREDOMINANTLY PMN     NO SQUAMOUS EPITHELIAL CELLS SEEN     FEW GRAM POSITIVE COCCI     IN CLUSTERS     Performed at [facility]   Culture     Final   Value: NO ANAEROBES ISOLATED; CULTURE IN PROGRESS FOR 5  DAYS     Performed at [facility]   Report Status PENDING   Incomplete  MRSA PCR SCREENING     Status: None   Collection Time    08/23/14  4:16 AM      Result Value Ref Range Status   MRSA by PCR NEGATIVE  NEGATIVE Final   Comment:            The GeneXpert MRSA Assay (FDA     approved for NASAL specimens     only), is one component of a     comprehensive MRSA colonization     surveillance program. It is not     intended to diagnose MRSA     infection nor to guide or     monitor treatment for     MRSA infections.     Labs: Results for orders placed during the hospital encounter of 08/22/14 (from the past 48 hour(s))  CBC WITH DIFFERENTIAL     Status: Abnormal   Collection Time    08/22/14  4:13 PM      Result Value Ref Range   WBC 10.3  4.0 - 10.5 K/uL   RBC 3.45 (*) 4.22 - 5.81 MIL/uL   Hemoglobin 9.6 (*) 13.0 - 17.0 g/dL   HCT 31.7 (*) 39.0 - 52.0 %   MCV 91.9  78.0 - 100.0 fL   MCH 27.8  26.0 - 34.0 pg   MCHC 30.3  30.0 - 36.0 g/dL   RDW 16.2 (*) 11.5 - 15.5 %   Platelets 167  150 - 400 K/uL   Neutrophils Relative % 89 (*) 43 - 77 %   Neutro Abs 9.0 (*) 1.7 - 7.7 K/uL   Lymphocytes Relative 6 (*) 12 - 46 %   Lymphs Abs 0.7  0.7 - 4.0 K/uL   Monocytes Relative 4  3 - 12 %   Monocytes Absolute 0.5  0.1 - 1.0 K/uL   Eosinophils Relative 1  0 - 5 %   Eosinophils Absolute 0.1  0.0 - 0.7  K/uL   Basophils Relative 0  0 - 1 %   Basophils Absolute 0.0  0.0 - 0.1 K/uL  CBG MONITORING, ED     Status: Abnormal   Collection Time    08/22/14  4:20 PM      Result Value Ref Range   Glucose-Capillary 575 (*) 70 - 99 mg/dL   Comment 1 Documented in Chart    COMPREHENSIVE METABOLIC PANEL     Status: Abnormal   Collection Time    08/22/14  4:54 PM      Result Value Ref Range   Sodium 126 (*) 137 - 147 mEq/L   Potassium 4.5  3.7 - 5.3 mEq/L   Chloride 83 (*) 96 - 112 mEq/L   CO2 22  19 - 32 mEq/L   Glucose, Bld 583 (*) 70 - 99 mg/dL   Comment: CRITICAL RESULT CALLED  TO, READ BACK BY AND VERIFIED WITH:     Roxanna Mew RN [id number] 08/22/14 A NAVARRO   BUN 36 (*) 6 - 23 mg/dL   Creatinine, Ser 4.24 (*) 0.50 - 1.35 mg/dL   Calcium 9.2  8.4 - 10.5 mg/dL   Total Protein 7.9  6.0 - 8.3 g/dL   Albumin 2.8 (*) 3.5 - 5.2 g/dL   AST 11  0 - 37 U/L   ALT 10  0 - 53 U/L   Alkaline Phosphatase 432 (*) 39 - 117 U/L   Total Bilirubin 0.5  0.3 - 1.2 mg/dL   GFR calc non Af Amer 16 (*) >90 mL/min   GFR calc Af Amer 18 (*) >90 mL/min   Comment: (NOTE)     The eGFR has been calculated using the CKD EPI equation.     This calculation has not been validated in all clinical situations.     eGFR's persistently <90 mL/min signify possible Chronic Kidney     Disease.   Anion gap 21 (*) 5 - 15  BLOOD GAS, VENOUS     Status: Abnormal   Collection Time    08/22/14  5:30 PM      Result Value Ref Range   FIO2 0.21     pH, Ven 7.327 (*) 7.250 - 7.300   pCO2, Ven 46.7  45.0 - 50.0 mmHg   pO2, Ven BELOW REPORTABLE RANGE  30.0 - 45.0 mmHg   Comment: RBV     KNAPP,MD AT 17:38 ON 08/22/14 BY MJACKSON,RRT,RCP   Bicarbonate 23.8  20.0 - 24.0 mEq/L   TCO2 22.4  0 - 100 mmol/L   Acid-base deficit 1.9  0.0 - 2.0 mmol/L   O2 Saturation 22.8     Patient temperature 98.6     Collection site VEIN     Drawn by COLLECTED BY NURSE     Sample type VENOUS    I-STAT CG4 LACTIC ACID, ED     Status: None   Collection Time    08/22/14  5:35 PM      Result Value Ref Range   Lactic Acid, Venous 1.44  0.5 - 2.2 mmol/L  CBG MONITORING, ED     Status: Abnormal   Collection Time    08/22/14  5:40 PM      Result Value Ref Range   Glucose-Capillary 562 (*) 70 - 99 mg/dL   Comment 1 Notify RN    URINALYSIS, ROUTINE W REFLEX MICROSCOPIC     Status: Abnormal   Collection Time    08/22/14  7:06 PM      Result  Value Ref Range   Color, Urine YELLOW  YELLOW   APPearance CLEAR  CLEAR   Specific Gravity, Urine 1.019  1.005 - 1.030   pH 7.5  5.0 - 8.0   Glucose, UA >1000 (*) NEGATIVE mg/dL   Hgb urine  dipstick LARGE (*) NEGATIVE   Bilirubin Urine NEGATIVE  NEGATIVE   Ketones, ur 15 (*) NEGATIVE mg/dL   Protein, ur 100 (*) NEGATIVE mg/dL   Urobilinogen, UA 0.2  0.0 - 1.0 mg/dL   Nitrite NEGATIVE  NEGATIVE   Leukocytes, UA NEGATIVE  NEGATIVE  URINE MICROSCOPIC-ADD ON     Status: None   Collection Time    08/22/14  7:06 PM      Result Value Ref Range   WBC, UA 3-6  <3 WBC/hpf   RBC / HPF 11-20  <3 RBC/hpf  CBG MONITORING, ED     Status: Abnormal   Collection Time    08/22/14  9:31 PM      Result Value Ref Range   Glucose-Capillary 393 (*) 70 - 99 mg/dL  CBG MONITORING, ED     Status: Abnormal   Collection Time    08/22/14 10:34 PM      Result Value Ref Range   Glucose-Capillary 289 (*) 70 - 99 mg/dL  WOUND CULTURE     Status: None   Collection Time    08/22/14 11:34 PM      Result Value Ref Range   Specimen Description SCROTUM SWAB OF SCROTUM     Special Requests NONE     Gram Stain       Value: ABUNDANT WBC PRESENT, PREDOMINANTLY PMN     NO SQUAMOUS EPITHELIAL CELLS SEEN     FEW GRAM POSITIVE COCCI     IN CLUSTERS     Performed at [facility]   Culture       Value: Culture reincubated for better growth     Performed at [facility]   Report Status PENDING    ANAEROBIC CULTURE     Status: None   Collection Time    08/22/14 11:34 PM      Result Value Ref Range   Specimen Description SCROTUM SWAB OF SCROTUM     Special Requests NONE     Gram Stain       Value: ABUNDANT WBC PRESENT, PREDOMINANTLY PMN     NO SQUAMOUS EPITHELIAL CELLS SEEN     FEW GRAM POSITIVE COCCI     IN CLUSTERS     Performed at [facility]   Culture       Value: NO ANAEROBES ISOLATED; CULTURE IN PROGRESS FOR 5 DAYS     Performed at [facility]   Report Status PENDING    GLUCOSE, CAPILLARY     Status: Abnormal   Collection Time    08/22/14 11:36 PM      Result Value Ref Range   Glucose-Capillary 138 (*) 70 - 99 mg/dL  GLUCOSE, CAPILLARY     Status: Abnormal    Collection Time    08/23/14 12:18 AM      Result Value Ref Range   Glucose-Capillary 170 (*) 70 - 99 mg/dL  GLUCOSE, CAPILLARY     Status: Abnormal   Collection Time    08/23/14  1:04 AM      Result Value Ref Range   Glucose-Capillary 123 (*) 70 - 99 mg/dL  GLUCOSE, CAPILLARY     Status: None   Collection Time  08/23/14  2:14 AM      Result Value Ref Range   Glucose-Capillary 75  70 - 99 mg/dL  BASIC METABOLIC PANEL     Status: Abnormal   Collection Time    08/23/14  3:26 AM      Result Value Ref Range   Sodium 131 (*) 137 - 147 mEq/L   Potassium 4.2  3.7 - 5.3 mEq/L   Chloride 91 (*) 96 - 112 mEq/L   CO2 23  19 - 32 mEq/L   Glucose, Bld 98  70 - 99 mg/dL   BUN 36 (*) 6 - 23 mg/dL   Creatinine, Ser 4.65 (*) 0.50 - 1.35 mg/dL   Calcium 9.2  8.4 - 10.5 mg/dL   GFR calc non Af Amer 14 (*) >90 mL/min   GFR calc Af Amer 16 (*) >90 mL/min   Comment: (NOTE)     The eGFR has been calculated using the CKD EPI equation.     This calculation has not been validated in all clinical situations.     eGFR's persistently <90 mL/min signify possible Chronic Kidney     Disease.   Anion gap 17 (*) 5 - 15  CBC     Status: Abnormal   Collection Time    08/23/14  3:26 AM      Result Value Ref Range   WBC 11.9 (*) 4.0 - 10.5 K/uL   RBC 3.51 (*) 4.22 - 5.81 MIL/uL   Hemoglobin 9.9 (*) 13.0 - 17.0 g/dL   HCT 30.8 (*) 39.0 - 52.0 %   MCV 87.7  78.0 - 100.0 fL   MCH 28.2  26.0 - 34.0 pg   MCHC 32.1  30.0 - 36.0 g/dL   RDW 16.1 (*) 11.5 - 15.5 %   Platelets 238  150 - 400 K/uL  GLUCOSE, CAPILLARY     Status: Abnormal   Collection Time    08/23/14  3:32 AM      Result Value Ref Range   Glucose-Capillary 45 (*) 70 - 99 mg/dL  GLUCOSE, CAPILLARY     Status: None   Collection Time    08/23/14  3:54 AM      Result Value Ref Range   Glucose-Capillary 76  70 - 99 mg/dL  MRSA PCR SCREENING     Status: None   Collection Time    08/23/14  4:16 AM      Result Value Ref Range   MRSA by PCR  NEGATIVE  NEGATIVE   Comment:            The GeneXpert MRSA Assay (FDA     approved for NASAL specimens     only), is one component of a     comprehensive MRSA colonization     surveillance program. It is not     intended to diagnose MRSA     infection nor to guide or     monitor treatment for     MRSA infections.  GLUCOSE, CAPILLARY     Status: Abnormal   Collection Time    08/23/14  5:05 AM      Result Value Ref Range   Glucose-Capillary 67 (*) 70 - 99 mg/dL  GLUCOSE, CAPILLARY     Status: None   Collection Time    08/23/14  5:23 AM      Result Value Ref Range   Glucose-Capillary 92  70 - 99 mg/dL  BASIC METABOLIC PANEL     Status: Abnormal  Collection Time    08/23/14  5:52 AM      Result Value Ref Range   Sodium 131 (*) 137 - 147 mEq/L   Potassium 4.3  3.7 - 5.3 mEq/L   Chloride 90 (*) 96 - 112 mEq/L   CO2 24  19 - 32 mEq/L   Glucose, Bld 85  70 - 99 mg/dL   BUN 38 (*) 6 - 23 mg/dL   Creatinine, Ser 4.70 (*) 0.50 - 1.35 mg/dL   Calcium 8.9  8.4 - 10.5 mg/dL   GFR calc non Af Amer 14 (*) >90 mL/min   GFR calc Af Amer 16 (*) >90 mL/min   Comment: (NOTE)     The eGFR has been calculated using the CKD EPI equation.     This calculation has not been validated in all clinical situations.     eGFR's persistently <90 mL/min signify possible Chronic Kidney     Disease.   Anion gap 17 (*) 5 - 15  GLUCOSE, CAPILLARY     Status: None   Collection Time    08/23/14  6:25 AM      Result Value Ref Range   Glucose-Capillary 93  70 - 99 mg/dL  BASIC METABOLIC PANEL     Status: Abnormal   Collection Time    08/23/14  7:25 AM      Result Value Ref Range   Sodium 131 (*) 137 - 147 mEq/L   Potassium 4.0  3.7 - 5.3 mEq/L   Chloride 91 (*) 96 - 112 mEq/L   CO2 23  19 - 32 mEq/L   Glucose, Bld 117 (*) 70 - 99 mg/dL   BUN 38 (*) 6 - 23 mg/dL   Creatinine, Ser 4.84 (*) 0.50 - 1.35 mg/dL   Calcium 8.8  8.4 - 10.5 mg/dL   GFR calc non Af Amer 13 (*) >90 mL/min   GFR calc Af Amer 15  (*) >90 mL/min   Comment: (NOTE)     The eGFR has been calculated using the CKD EPI equation.     This calculation has not been validated in all clinical situations.     eGFR's persistently <90 mL/min signify possible Chronic Kidney     Disease.   Anion gap 17 (*) 5 - 15  GLUCOSE, CAPILLARY     Status: Abnormal   Collection Time    08/23/14  7:39 AM      Result Value Ref Range   Glucose-Capillary 128 (*) 70 - 99 mg/dL  GLUCOSE, CAPILLARY     Status: Abnormal   Collection Time    08/23/14  8:54 AM      Result Value Ref Range   Glucose-Capillary 125 (*) 70 - 99 mg/dL  GLUCOSE, CAPILLARY     Status: Abnormal   Collection Time    08/23/14 10:21 AM      Result Value Ref Range   Glucose-Capillary 157 (*) 70 - 99 mg/dL  GLUCOSE, CAPILLARY     Status: Abnormal   Collection Time    08/23/14 11:54 AM      Result Value Ref Range   Glucose-Capillary 183 (*) 70 - 99 mg/dL  GLUCOSE, CAPILLARY     Status: Abnormal   Collection Time    08/23/14  1:03 PM      Result Value Ref Range   Glucose-Capillary 172 (*) 70 - 99 mg/dL  GLUCOSE, CAPILLARY     Status: Abnormal   Collection Time    08/23/14  1:42  PM      Result Value Ref Range   Glucose-Capillary 163 (*) 70 - 99 mg/dL  TSH     Status: Abnormal   Collection Time    08/23/14  7:30 PM      Result Value Ref Range   TSH 8.460 (*) 0.350 - 4.500 uIU/mL  GLUCOSE, CAPILLARY     Status: Abnormal   Collection Time    08/23/14  7:44 PM      Result Value Ref Range   Glucose-Capillary 152 (*) 70 - 99 mg/dL  GLUCOSE, CAPILLARY     Status: Abnormal   Collection Time    08/24/14  9:06 AM      Result Value Ref Range   Glucose-Capillary 378 (*) 70 - 99 mg/dL     HPI 43 year old male with end-stage renal disease, on hemodialysis and type 1 diabetes who is status post placement of a three-piece inflatable Coloplast penile prosthesis on 07/24/2014 by Dr. Carolan Clines. The patient over the past week or 2 has had worsening pain, with some  scrotal drainage. He presented to the office today for evaluation and was felt to have a possible infection. He was sent to the emergency room for possible admission as well as CT scan. CT revealed ascites, as well as gas along his penile prosthesis. There is worried for gas forming bacteria. The patient has not had any fevers but has had shakes and chills. He has had an eschar on the end of his penis which he has, at times, had to remove to help him urinate, as it covers his meatus. He still makes urine despite his dialysis.  In the emergency room, the patient has significant penile edema and tenderness. There is no crepitus. The skin looks viable. There is minimal scrotal wound discharge. I did review his CT scan, and am worried about a gas forming bacteria along his prosthesis. Patient underwent penile prosthesis explant emergently. CBC was elevated to greater than 600 upon admission    HOSPITAL COURSE:  Status post explant of penile prosthesis secondary to infection  Follow culture  Initiated on Zosyn, vancomycin and clindamycin , switched to Augmentin and doxycycline(esrd dozing) Can continue with vancomycin with hemodialysis, Tuesday Thursday Saturday Duration will be based on culture results , currently scheduled for 2 weeks post discharge Pt to DC with foley  Diabetes  Started on DKA protocol  Restarted back on Lantus home dose and lispro Accu-Cheks at least 4 times a day   End-stage renal disease on hemodialysis Tuesday Thursday Saturday, next hemodialysis 10/17   Hypertension initially blood pressure soft Held Lasix, clonidine, and/or Continue coreg  Resume his medications as blood pressure improves  Nausea? Side effect of his anesthesia vs ileus, KUB negative, resolved   Discharge Exam: Blood pressure 108/41, pulse 89, temperature 99.2 F (37.3 C), temperature source Oral, resp. rate 18, height _0  (1.905 m), weight 81.5 kg (179 lb 10.8 oz), SpO2 94.00%.  General: Well  developed, well nourished, pale ill appearing, nauseated  Head: Normocephalic, atraumatic, sclera non-icteric, mucus membranes are moist  Neck: Supple. JVD not elevated.  Lungs: Clear bilaterally to auscultation without wheezes, rales, or rhonchi. Breathing is unlabored.  Heart: RRR Bruit radiates to heart  Abdomen: Soft, non-tender, mod distended - "usual per pt"  Lower extremities: 1 + LE edema; right transmet well healed;  Neuro: Alert and oriented X 3. Moves all extremities spontaneously.  Psych: Responds to questions appropriately with a normal affect.  Dialysis Access: left  upper AVGG + bruit             Follow-up Information   Follow up with Arnette Norris, MD. Schedule an appointment as soon as possible for a visit in 1 week.   Specialty:  Family Medicine   Contact information:   [place] [address] [phone]       Follow up with Jorja Loa, MD In 2 days.   Specialty:  Urology   Contact information:   [place] [address] [phone]       Signed: Reyne Dumas 08/24/2014, 11:19 AM

## 2014-08-24 NOTE — Progress Notes (Signed)
Urology Progress Note  2 Days Post-Op : MRSA: NEG                                                                     Anareobic C/S: NEG                                                                     Aerobic C/s : pending  Subjective: "feeling better" . Pain decreased.     No acute urologic events overnight. Ambulation:   negative Flatus:    positive Bowel movement  negative  Pain: some relief  Objective:  Blood pressure 108/41, pulse 89, temperature 99.2 F (37.3 C), temperature source Oral, resp. rate 18, height 6\' 3"  (1.905 m), weight 81.5 kg (179 lb 10.8 oz), SpO2 94.00%.  Physical Exam:  General:  No acute distress, awake slight serosanguinous drainage on sRight  suprapubic dressing Genitourinary:  Penile edema decreased Foley:  out    I/O last 3 completed shifts: In: 870 [I.V.:670; IV Piggyback:200] Out: 3508 [Other:3508]  Recent Labs     08/22/14  1613  08/23/14  0326  HGB  9.6*  9.9*  WBC  10.3  11.9*  PLT  167  238    Recent Labs     08/23/14  0552  08/23/14  0725  NA  131*  131*  K  4.3  4.0  CL  90*  91*  CO2  24  23  BUN  38*  38*  CREATININE  4.70*  4.84*  CALCIUM  8.9  8.8  GFRNONAA  14*  13*  GFRAA  16*  15*     No results found for this basename: PT, INR, APTT,  in the last 72 hours   No components found with this basename: ABG,   Assessment/Plan:  Wound care discussed. Ok to 08/25/14 for d/c from Urology standpoint Antibiotics at end of dialysis run, per pharmacy protocol. RTC 6 weeks.

## 2014-08-24 NOTE — Progress Notes (Signed)
Subjective:  Had HD yest- removed 3500 tolerated well.  Has been cleared for discharge by urology but he is still having pain so wants to stay and make sure infection clears   Objective Vital signs in last 24 hours: Filed Vitals:   08/23/14 1845 08/23/14 1855 08/23/14 1945 08/24/14 0559  BP: 163/60 168/64 132/40 108/41  Pulse: 91 90 87 89  Temp:  98 F (36.7 C) 98.8 F (37.1 C) 99.2 F (37.3 C)  TempSrc:  Oral    Resp:  18 17 18   Height:      Weight:  81.5 kg (179 lb 10.8 oz)    SpO2:  98% 97% 94%   Weight change: 0.3 kg (10.6 oz)  Intake/Output Summary (Last 24 hours) at 08/24/14 1109 Last data filed at 08/24/14 0600  Gross per 24 hour  Intake    550 ml  Output   3508 ml  Net  -2958 ml   Dialysis Orders: [facility] [address]  EDW 81 (he thinks)  ACTUAL orders - 3.25 hr EDW 80 450/800 2 K 2.5 Ca NO Heparin left upper AVGG - Hectorol 1 Epo 6000 - kinetics marginal at 3.25 hr   Assessment/Plan:  1. s/p removal of infected penile prosthesis =- on Vanc and Zosyn 2. ESRD - TTS [facility] - only runs 3.25 hr - has excess volume - awaiting records - run today - no heparin--really needs increased time- have written orders for tomorrow 3. Hypertension/volume - on low dose coreg bid, needs decrease volume but BP is limiting right now (pain meds ?). 4. Anemia - Hgb 9.9 pre op - awaiting information re ESA/Fe 5. Metabolic bone disease - Awaiting info re: VDRA, on sensipar/renvela tid - check P level 6. Nutrition - renal diet 7. DM - per primary 8. Dispo- will write orders for first shift to be done here in AM- hopeful for discharge soon      Burgundy Matuszak A    Labs: Basic Metabolic Panel:  Recent Labs Lab 08/23/14 0326 08/23/14 0552 08/23/14 0725  NA 131* 131* 131*  K 4.2 4.3 4.0  CL 91* 90* 91*  CO2 23 24 23   GLUCOSE 98 85 117*  BUN 36* 38* 38*  CREATININE 4.65* 4.70* 4.84*  CALCIUM 9.2 8.9 8.8   Liver Function Tests:  Recent Labs Lab  08/22/14 1654  AST 11  ALT 10  ALKPHOS 432*  BILITOT 0.5  PROT 7.9  ALBUMIN 2.8*   No results found for this basename: LIPASE, AMYLASE,  in the last 168 hours No results found for this basename: AMMONIA,  in the last 168 hours CBC:  Recent Labs Lab 08/22/14 1613 08/23/14 0326  WBC 10.3 11.9*  NEUTROABS 9.0*  --   HGB 9.6* 9.9*  HCT 31.7* 30.8*  MCV 91.9 87.7  PLT 167 238   Cardiac Enzymes: No results found for this basename: CKTOTAL, CKMB, CKMBINDEX, TROPONINI,  in the last 168 hours CBG:  Recent Labs Lab 08/23/14 1154 08/23/14 1303 08/23/14 1342 08/23/14 1944 08/24/14 0906  GLUCAP 183* 172* 163* 152* 378*    Iron Studies: No results found for this basename: IRON, TIBC, TRANSFERRIN, FERRITIN,  in the last 72 hours Studies/Results: Ct Abdomen Pelvis Wo Contrast  08/22/2014   CLINICAL DATA:  43 year old male with diabetes and end-stage renal disease. Penile prosthesis implanted 07/24/2014. Increasing pain and swelling  EXAM: CT ABDOMEN AND PELVIS WITHOUT CONTRAST  TECHNIQUE: Multidetector CT imaging of the abdomen and pelvis was performed following the  standard protocol without IV contrast.  COMPARISON:  None.  FINDINGS: Lower chest:  Surgical changes of prior median sternotomy. Otherwise unremarkable appearance of the subcutaneous tissues of the lower chest wall.  Heart size within normal limits. Retained epicardial pacing lead courses from the pericardium to the midline soft tissues.  No pericardial fluid/thickening.  No confluent airspace disease pleural fluid or pneumothorax identified. There are several sub cm nodules of the bilateral lungs, including the right middle lobe on image 6 and the left lower lobe on image 4.  Abdomen/pelvis:  Diffusely decreased attenuation of liver parenchyma, with Hounsfield units measuring 47. Unremarkable appearance of the spleen.  Atrophy parenchyma of the pancreas, with no associated inflammatory changes or fluid.  No pericholecystic  fluid or inflammatory changes. No radiopaque gallstones. No intrahepatic or extrahepatic biliary duct dilatation.  Unremarkable appearance of the bilateral adrenal glands.  Native kidneys are atrophied with calcifications of the vasculature the bilateral renal hila. No hydronephrosis. Bilateral hypodense foci within the kidney parenchyma, too small to completely characterize by CT.  Hypodense fluid within the abdomen, including bilateral subdiaphragmatic space, perihepatic space, perisplenic space, and extending within the peritoneum in the interloop space to the pelvis. No abnormally distended small bowel or colon.  Penile prosthesis in situ. The reservoir is present in the right inguinal region.  There is extensive gas throughout the tissues of the corpora cavernosum bilaterally, as well as along the course of the tube being within the scrotum. Scrotal edema/ fluid present.  Scattered calcifications of the abdominal aorta. More extensive calcifications of the mesenteric vessels and of the small vessels of the pelvis, compatible with the history of longstanding diabetes. Calcifications of the vasculature of the upper lower extremities.  IMPRESSION: Extensive gas along the course of penile implant within the corpora cavernosa bilaterally, concerning for gas-forming organism in the setting of infection.  Fluid within the peritoneal cavity, may reflect inflammation, or alternatively edema/ anasarca with positive fluid balance.  Extensive small vessel calcification, compatible with atherosclerosis in the setting of long-standing diabetes.  These results were called by telephone at the time of interpretation on 08/22/2014 at 9:05 pm to the nurse caring for the patient, Mr. Percival Spanish, who verbally acknowledged these results.  Signed,  Yvone Neu. Loreta Ave, DO  [facility]  [facility]   Electronically Signed   By: Gilmer Mor D.O.   On: 08/22/2014 21:08   Dg Abd 1  View  08/23/2014   CLINICAL DATA:  Nausea and vomiting.  Initial encounter.  EXAM: ABDOMEN - 1 VIEW  COMPARISON:  None.  FINDINGS: There is no evidence of bowel obstruction. No concern intra-abdominal mass effect or calcification. A surgical drain overlaps the right inguinal region. Diffuse arterial calcification. No acute osseous findings.  IMPRESSION: No evidence for bowel obstruction or ileus.   Electronically Signed   By: Tiburcio Pea M.D.   On: 08/23/2014 23:14   Medications: Infusions: . dextrose 5 % and 0.45% NaCl 50 mL/hr at 08/23/14 0998    Scheduled Medications: . aspirin  325 mg Oral Q breakfast  . carvedilol  6.25 mg Oral BID WC  . cinacalcet  60 mg Oral Q breakfast  . darbepoetin (ARANESP) injection - DIALYSIS  40 mcg Intravenous Q Thu-HD  . doxercalciferol  1 mcg Intravenous Q T,Th,Sa-HD  . heparin  5,000 Units Subcutaneous 3 times per day  . insulin aspart  0-9 Units Subcutaneous TID WC  . insulin glargine  30 Units Subcutaneous Daily  .  levothyroxine  75 mcg Oral QAC breakfast  . omega-3 acid ethyl esters  1 g Oral Daily  . pantoprazole  40 mg Oral QHS  . piperacillin-tazobactam (ZOSYN)  IV  2.25 g Intravenous Q8H  . pregabalin  150 mg Oral Daily  . pregabalin  300 mg Oral QHS  . rosuvastatin  20 mg Oral QHS  . sevelamer carbonate  800 mg Oral TID WC    have reviewed scheduled and prn medications.  Physical Exam: General: chronically ill appearing Heart: RRR Lungs: mostly clear Abdomen: soft, non tender Extremities: edema Dialysis Access: left upper arm AVG    08/24/2014,11:09 AM  LOS: 2 days

## 2014-08-24 NOTE — Consult Note (Addendum)
WOC consult requested for dressing change orders regarding discharge home; this is a post-op incision from urology surgery yesterday. This is beyond [facility] scope of practice, please refer to Dr Letha Cape who performed the surgery for further plan of care. Please re-consult if further assistance is needed.  Thank-you,  Cammie Mcgee MSN, RN, CWOCN, [place], CNS [phone]

## 2014-08-24 NOTE — Progress Notes (Signed)
CARE MANAGEMENT NOTE 08/24/2014  Patient:  Taylor [name]   Account Number:  [id number]  Date Initiated:  08/24/2014  Documentation initiated by:  Lizabeth Leyden  Subjective/Objective Assessment:   admittd with groin pain,cellulitis     Action/Plan:   progression of care and discharge planning   Anticipated DC Date:  08/24/2014   Anticipated DC Plan:  [place]  CM consult      [facility] Choice  HOME HEALTH   Choice offered to / List presented to:  C-1 Patient        [place] arranged  HH-1 RN      [place].   Status of service:  Completed, signed off Medicare Important Message given?   (If response is "NO", the following Medicare IM given date fields will be blank) Date Medicare IM given:   Medicare IM given by:   Date Additional Medicare IM given:   Additional Medicare IM given by:    Discharge Disposition:  [place]  Per UR Regulation:    If discussed at Long Length of Stay Meetings, dates discussed:    Comments:  08/24/2014  [place], [place] [phone] CM referral: home health RN  Met with patient regarding choice for home health services. He selected [place].  [facility]. NCM called left voice mail with referral for [facility].

## 2014-08-24 NOTE — Evaluation (Signed)
Physical Therapy Evaluation Patient Details Name: Taylor [name] MRN: [id number] DOB: 1970/12/22 Today's Date: 08/24/2014   History of Present Illness  Pt. admitted 08/22/14 with infected penile implant, underwent emergent explant  same date.  Pt. with h/o DM I, ESRD on HD TuThSat, HTN.    Clinical Impression  Pt. Presents with a decline in his usual functional level and will benefit from acute PT to address these and below problem areas.  He is more unsteady in his walking as compared to his baseline and is at fairly high risk of falls.  Have educated pt. To call for assist for all OOB.  I believe pt. Will benefit from wheelchair with cushion for community access and pt. Is in agreement .  His dad does not believe they need a WC and plans to use the rollator walker for transport.  Suggest HHPT upon DC from hospital to maximize pt's functional mobility in home environment.      Follow Up Recommendations Home health PT;Supervision/Assistance - 24 hour;Supervision for mobility/OOB    Equipment Recommendations  Rolling walker with 5" wheels;Wheelchair (measurements PT);Wheelchair cushion (measurements PT);Other (comment) (pt's Dad says they don't need a WC)    Recommendations for Other Services       Precautions / Restrictions Precautions Precautions: Fall Restrictions Weight Bearing Restrictions: No      Mobility  Bed Mobility Overal bed mobility: Needs Assistance Bed Mobility: Supine to Sit;Sit to Supine     Supine to sit: Min guard Sit to supine: Min guard   General bed mobility comments: pt. needs increased time for tasks due to pain; min guard for safety and verbal cues for self monitoring of activity speed   Transfers Overall transfer level: Needs assistance Equipment used: Rolling walker (2 wheeled) Transfers: Sit to/from Stand Sit to Stand: Min assist         General transfer comment: needed min assist to safely rise to stand and to control descent; needed  2 trials before able to fully stand due to weak power up  Ambulation/Gait Ambulation/Gait assistance: Min assist;Mod assist Ambulation Distance (Feet): 60 Feet Assistive device: Rolling walker (2 wheeled) Gait Pattern/deviations: Step-through pattern;Decreased step length - right;Decreased step length - left;Steppage;Trunk flexed Gait velocity: decreased   General Gait Details: significant bilateral foot drop necessitating steppage gait for clearance.  Pt. states he has had a trial of AFO's in the past but they "threw me too far forward".  Pt. with moderate instability at times, needed mod assist to correct and prevent falls  Stairs            Wheelchair Mobility    Modified Rankin (Stroke Patients Only)       Balance Overall balance assessment: Needs assistance Sitting-balance support: No upper extremity supported;Feet supported Sitting balance-Leahy Scale: Good     Standing balance support: Bilateral upper extremity supported;During functional activity Standing balance-Leahy Scale: Poor Standing balance comment: needs support of RW for maintaining upright , along with min assist from therapist                             Pertinent Vitals/Pain Pain Assessment: 0-10 Pain Score: 5  Pain Location: penile surgical site Pain Descriptors / Indicators: Aching Pain Intervention(s): Premedicated before session    Home Living Family/patient expects to be discharged to:: Private residence Living Arrangements: Parent (pt. lives with Mom and Dad) Available Help at Discharge: Available 24 hours/day Type of Home: House Home Access: Stairs  to enter Entrance Stairs-Rails: None Entrance Stairs-Number of Steps: 1 Home Layout: One level Home Equipment: Cane - single point;Other (comment);Walker - 4 wheels (uses WC at HD)      Prior Function Level of Independence: Independent with assistive device(s)         Comments: uses cane out of the home     Hand  Dominance        Extremity/Trunk Assessment   Upper Extremity Assessment: RUE deficits/detail;LUE deficits/detail RUE Deficits / Details: pt. with notable peripheral neuropathic weakness in hand   RUE Sensation: decreased light touch;history of peripheral neuropathy LUE Deficits / Details: pt. 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 with significant weakness in foot from peripheral neuropathy LLE Deficits / Details: pt. with weakness in foot due to peripheral neuropathy  Cervical / Trunk Assessment: Normal  Communication   Communication: No difficulties  Cognition Arousal/Alertness: Awake/alert Behavior During Therapy: Flat affect Overall Cognitive Status: Within Functional Limits for tasks assessed                      General Comments General comments (skin integrity, edema, etc.): multiple toe amputaions (B)    Exercises        Assessment/Plan    PT Assessment Patient needs continued PT services  PT Diagnosis Difficulty walking;Acute pain;Generalized weakness   PT Problem List Decreased strength;Decreased activity tolerance;Decreased balance;Decreased mobility;Decreased knowledge of use of DME;Impaired sensation;Pain  PT Treatment Interventions DME instruction;Gait training;Functional mobility training;Therapeutic activities;Therapeutic exercise;Balance training;Patient/family education   PT Goals (Current goals can be found in the Care Plan section) Acute Rehab PT Goals Patient Stated Goal: home and eventually resume driving self to HD PT Goal Formulation: With patient Time For Goal Achievement: 08/31/14 Potential to Achieve Goals: Good    Frequency Min 3X/week   Barriers to discharge        Co-evaluation               End of Session Equipment Utilized During Treatment: Gait belt Activity Tolerance: Patient tolerated treatment  well Patient left: in bed;with call bell/phone within reach;with family/visitor present (pt's dad present) Nurse Communication: Mobility status         Time: 2263-3354 PT Time Calculation (min): 28 min   Charges:   PT Evaluation $Initial PT Evaluation Tier I: 1 Procedure PT Treatments $Gait Training: 8-22 mins   PT G CodesFerman [name] 08/24/2014, 2:38 PM Weldon Picking PT Acute Rehab Services [phone] [place] [phone]

## 2014-08-24 NOTE — Progress Notes (Signed)
ANTIBIOTIC CONSULT NOTE - FOLLOW UP  Pharmacy Consult for Zosyn and vancomycin Indication: scrotal/groin infection  Allergies  Allergen Reactions  . Amoxicillin-Pot Clavulanate Nausea And Vomiting  . Hydromorphone Hcl     "body started down"  08/22/14 - pt states no longer has problems with this  . Rifampin Nausea And Vomiting    Patient Measurements: Height: 6\' 3"  (190.5 cm) Weight: 179 lb 10.8 oz (81.5 kg) IBW/kg (Calculated) : 84.5   Vital Signs: Temp: 98.6 F (37 C) (10/16 1124) Temp Source: Oral (10/16 1124) BP: 112/53 mmHg (10/16 1124) Pulse Rate: 86 (10/16 1124) Intake/Output from previous day: 10/15 0701 - 10/16 0700 In: 550 [I.V.:350; IV Piggyback:200] Out: 3508  Intake/Output from this shift:    Labs:  Recent Labs  08/22/14 1613  08/23/14 0326 08/23/14 0552 08/23/14 0725  WBC 10.3  --  11.9*  --   --   HGB 9.6*  --  9.9*  --   --   PLT 167  --  238  --   --   CREATININE  --   < > 4.65* 4.70* 4.84*  < > = values in this interval not displayed. Estimated Creatinine Clearance: 22.2 ml/min (by C-G formula based on Cr of 4.84). No results found for this basename: VANCOTROUGH, 08/25/14, VANCORANDOM, GENTTROUGH, GENTPEAK, GENTRANDOM, TOBRATROUGH, TOBRAPEAK, TOBRARND, AMIKACINPEAK, AMIKACINTROU, AMIKACIN,  in the last 72 hours   Microbiology: Recent Results (from the past 720 hour(s))  WOUND CULTURE     Status: None   Collection Time    08/22/14 11:34 PM      Result Value Ref Range Status   Specimen Description SCROTUM SWAB OF SCROTUM   Final   Special Requests NONE   Final   Gram Stain     Final   Value: ABUNDANT WBC PRESENT, PREDOMINANTLY PMN     NO SQUAMOUS EPITHELIAL CELLS SEEN     FEW GRAM POSITIVE COCCI     IN CLUSTERS     Performed at 08/24/14   Culture     Final   Value: Culture reincubated for better growth     Performed at [facility]   Report Status PENDING   Incomplete  ANAEROBIC CULTURE     Status: None   Collection Time    08/22/14 11:34 PM      Result Value Ref Range Status   Specimen Description SCROTUM SWAB OF SCROTUM   Final   Special Requests NONE   Final   Gram Stain     Final   Value: ABUNDANT WBC PRESENT, PREDOMINANTLY PMN     NO SQUAMOUS EPITHELIAL CELLS SEEN     FEW GRAM POSITIVE COCCI     IN CLUSTERS     Performed at 08/24/14   Culture     Final   Value: NO ANAEROBES ISOLATED; CULTURE IN PROGRESS FOR 5 DAYS     Performed at [facility]   Report Status PENDING   Incomplete  MRSA PCR SCREENING     Status: None   Collection Time    08/23/14  4:16 AM      Result Value Ref Range Status   MRSA by PCR NEGATIVE  NEGATIVE Final   Comment:            The GeneXpert MRSA Assay (FDA     approved for NASAL specimens     only), is one component of a     comprehensive MRSA colonization  surveillance program. It is not     intended to diagnose MRSA     infection nor to guide or     monitor treatment for     MRSA infections.    Anti-infectives   Start     Dose/Rate Route Frequency Ordered Stop   08/24/14 0000  amoxicillin-clavulanate (AUGMENTIN) 500-125 MG per tablet  Status:  Discontinued     1 tablet Oral 3 times daily 08/24/14 1118 08/24/14    08/24/14 0000  doxycycline (VIBRAMYCIN) 50 MG capsule     100 mg Oral 2 times daily 08/24/14 1118 09/07/14 2359   08/24/14 0000  amoxicillin-clavulanate (AUGMENTIN) 500-125 MG per tablet     1 tablet Oral  Every morning - 10a 08/24/14 1122 09/07/14 2359   08/24/14 0000  vancomycin in sodium chloride 0.9 % 250 mL  Status:  Discontinued     250 mL/hr over 60 Minutes   08/24/14 1132 08/24/14    08/24/14 0000  vancomycin in sodium chloride 0.9 % 250 mL    Comments:  Tuesday Thursday Saturday   250 mL/hr over 60 Minutes   08/24/14 1133 09/07/14 2359   08/23/14 1830  vancomycin (VANCOCIN) IVPB 1000 mg/200 mL premix     1,000 mg 200 mL/hr over 60 Minutes Intravenous  Once 08/23/14 1817 08/23/14 2100   08/23/14 0800   piperacillin-tazobactam (ZOSYN) IVPB 3.375 g  Status:  Discontinued     3.375 g 12.5 mL/hr over 240 Minutes Intravenous Every 8 hours 08/23/14 0235 08/23/14 0237   08/23/14 0400  piperacillin-tazobactam (ZOSYN) IVPB 2.25 g     2.25 g 100 mL/hr over 30 Minutes Intravenous Every 8 hours 08/23/14 0238     08/23/14 0245  piperacillin-tazobactam (ZOSYN) IVPB 3.375 g  Status:  Discontinued     3.375 g 100 mL/hr over 30 Minutes Intravenous  Once 08/23/14 0235 08/23/14 0237   08/22/14 2339  polymyxin B 500,000 Units, bacitracin 50,000 Units in sodium chloride irrigation 0.9 % 500 mL irrigation  Status:  Discontinued       As needed 08/22/14 2340 08/23/14 0024   08/22/14 2200  clindamycin (CLEOCIN) IVPB 600 mg  Status:  Discontinued     600 mg 100 mL/hr over 30 Minutes Intravenous 3 times per day 08/22/14 2144 08/23/14 1804   08/22/14 1800  vancomycin (VANCOCIN) 1,500 mg in sodium chloride 0.9 % 500 mL IVPB     1,500 mg 250 mL/hr over 120 Minutes Intravenous NOW 08/22/14 1739 08/22/14 2143   08/22/14 1730  vancomycin (VANCOCIN) IVPB 1000 mg/200 mL premix  Status:  Discontinued     1,000 mg 200 mL/hr over 60 Minutes Intravenous  Once 08/22/14 1720 08/22/14 1736      Assessment: 43yo male ESRD pt with groin/scrotal infection and CT concerning for gas-forming bacteria, now s/p emergent removal of penile implant 10/13. MRSA PCR was negative, wound cx currently has GPC in clusters growing, sensitivities pending- reincubated for better growth, no anaerobes have been isolated.  Patient continues on vancomycin and Zosyn.   Patient had a dialysis session 10/15 evening which lasted 3h with BFR of 430mL/min- slightly shorter session length than a full session, but at a slightly higher BFR.  Plans for next HD session 10/17- orders have been entered by renal team.  Goal of Therapy:  Pre-HD Vancomycin level 15-25 mcg/ml resolution of infection  Plan:  1.  Continue Zosyn 2.25g IV q8h 2. Vancomycin  1000mg  IV qHD TTS 3. Follow c/s and clinical progression; vancomycin level  as appropriate  Beyla Loney D. Lehman Whiteley, PharmD, BCPS Clinical Pharmacist Pager: [phone] 08/24/2014 11:42 AM

## 2014-08-24 NOTE — Care Management Note (Signed)
CARE MANAGEMENT NOTE 08/24/2014  Patient:  Taylor [name], Taylor [name]   Account Number:  [id number]  Date Initiated:  08/24/2014  Documentation initiated by:  Lizabeth Leyden  Subjective/Objective Assessment:   admittd with groin pain,cellulitis     Action/Plan:   progression of care and discharge planning   Anticipated DC Date:  08/24/2014   Anticipated DC Plan:  [place]  CM consult      [facility] Choice  HOME HEALTH   Choice offered to / List presented to:  C-1 Patient        [place] arranged  HH-1 RN      [place].   Status of service:  Completed, signed off Medicare Important Message given?  YES (If response is "NO", the following Medicare IM given date fields will be blank) Date Medicare IM given:  08/24/2014 Medicare IM given by:  Keigan Tafoya Date Additional Medicare IM given:   Additional Medicare IM given by:    Discharge Disposition:  [place]  Per UR Regulation:    If discussed at Long Length of Stay Meetings, dates discussed:    Comments:  08/24/2014  [place], [place] [phone] CM referral: home health RN  Met with patient regarding choice for home health services. He selected [place].  [facility]. NCM called left voice mail with referral for [facility].

## 2014-08-25 LAB — RENAL FUNCTION PANEL
ALBUMIN: 2.1 g/dL — AB (ref 3.5–5.2)
Anion gap: 18 — ABNORMAL HIGH (ref 5–15)
BUN: 25 mg/dL — ABNORMAL HIGH (ref 6–23)
CHLORIDE: 84 meq/L — AB (ref 96–112)
CO2: 21 mEq/L (ref 19–32)
CREATININE: 4.23 mg/dL — AB (ref 0.50–1.35)
Calcium: 7.7 mg/dL — ABNORMAL LOW (ref 8.4–10.5)
GFR calc Af Amer: 18 mL/min — ABNORMAL LOW (ref >90)
GFR calc non Af Amer: 16 mL/min — ABNORMAL LOW (ref >90)
GLUCOSE: 195 mg/dL — AB (ref 70–99)
POTASSIUM: 3.8 meq/L (ref 3.7–5.3)
Phosphorus: 2.6 mg/dL (ref 2.3–4.6)
Sodium: 123 mEq/L — ABNORMAL LOW (ref 137–147)

## 2014-08-25 LAB — WOUND CULTURE

## 2014-08-25 LAB — CLOSTRIDIUM DIFFICILE BY PCR: Toxigenic C. Difficile by PCR: NEGATIVE

## 2014-08-25 LAB — CBC
HEMATOCRIT: 27.3 % — AB (ref 39.0–52.0)
HEMOGLOBIN: 8.7 g/dL — AB (ref 13.0–17.0)
MCH: 27.9 pg (ref 26.0–34.0)
MCHC: 31.9 g/dL (ref 30.0–36.0)
MCV: 87.5 fL (ref 78.0–100.0)
Platelets: 151 10*3/uL (ref 150–400)
RBC: 3.12 MIL/uL — ABNORMAL LOW (ref 4.22–5.81)
RDW: 16.5 % — AB (ref 11.5–15.5)
WBC: 8.8 10*3/uL (ref 4.0–10.5)

## 2014-08-25 LAB — GLUCOSE, CAPILLARY
GLUCOSE-CAPILLARY: 215 mg/dL — AB (ref 70–99)
Glucose-Capillary: 165 mg/dL — ABNORMAL HIGH (ref 70–99)
Glucose-Capillary: 152 mg/dL — ABNORMAL HIGH (ref 70–99)

## 2014-08-25 LAB — T4, FREE: FREE T4: 1.17 ng/dL (ref 0.80–1.80)

## 2014-08-25 MED ORDER — FAMOTIDINE 20 MG PO TABS
20.0000 mg | ORAL_TABLET | Freq: Two times a day (BID) | ORAL | Status: DC
  Administered 2014-08-25 – 2014-08-27 (×4): 20 mg via ORAL
  Filled 2014-08-25 (×7): qty 1

## 2014-08-25 MED ORDER — LOPERAMIDE HCL 2 MG PO CAPS: 2.0000 mg | ORAL_CAPSULE | ORAL | Status: DC | PRN

## 2014-08-25 MED ORDER — SACCHAROMYCES BOULARDII 250 MG PO CAPS
250.0000 mg | ORAL_CAPSULE | Freq: Two times a day (BID) | ORAL | Status: DC
  Administered 2014-08-25 – 2014-08-27 (×5): 250 mg via ORAL
  Filled 2014-08-25 (×6): qty 1

## 2014-08-25 NOTE — Progress Notes (Signed)
Subjective:   Seen on dialysis; nausea, vomiting x 2, and several episodes of watery diarrhea overnight, currently no complaints- stooling in HD- possibly from the antibiotics ?   Objective: Vital signs in last 24 hours: Temp:  [97.8 F (36.6 C)-98.6 F (37 C)] 97.8 F (36.6 C) (10/17 0706) Pulse Rate:  [74-88] 76 (10/17 0716) Resp:  [15-16] 16 (10/17 0706) BP: (83-162)/(45-62) 143/58 mmHg (10/17 0716) SpO2:  [94 %-100 %] 94 % (10/17 0706) Weight:  [83.7 kg (184 lb 8.4 oz)-85.231 kg (187 lb 14.4 oz)] 83.7 kg (184 lb 8.4 oz) (10/17 0706) Weight change: -0.669 kg (-1 lb 7.6 oz)  Intake/Output from previous day: 10/16 0701 - 10/17 0700 In: 2670 [P.O.:1320; I.V.:1200; IV Piggyback:150] Out: 40 [Urine:40] Intake/Output this shift:   Lab Results:  Recent Labs  08/22/14 1613 08/23/14 0326  WBC 10.3 11.9*  HGB 9.6* 9.9*  HCT 31.7* 30.8*  PLT 167 238   BMET:  Recent Labs  08/22/14 1654  08/23/14 0552 08/23/14 0725  NA 126*  < > 131* 131*  K 4.5  < > 4.3 4.0  CL 83*  < > 90* 91*  CO2 22  < > 24 23  GLUCOSE 583*  < > 85 117*  BUN 36*  < > 38* 38*  CREATININE 4.24*  < > 4.70* 4.84*  CALCIUM 9.2  < > 8.9 8.8  ALBUMIN 2.8*  --   --   --   < > = values in this interval not displayed. No results found for this basename: PTH,  in the last 72 hours Iron Studies: No results found for this basename: IRON, TIBC, TRANSFERRIN, FERRITIN,  in the last 72 hours  Studies/Results: No results found.  EXAM: General appearance:  Alert, in no apparent distress Resp:  CTA without rales, rhonchi, or wheezes Cardio:  RRR without murmur or rub GI: + BS, soft and nontender Extremities:  1+ pretibial edema bilaterally Access: AVG @ LUA with BFR 450 cc/min  Dialysis Orders: [facility] - [address] EDW 81 (per pt)  ACTUAL orders - 3.25 hr EDW 80 450/800 2 K 2.5 Ca NO Heparin left upper AVGG - Hectorol 1 Epo 6000 - kinetics marginal at 3.25 hr  Assessment/Plan: 1. s/p removal of  infected penile prosthesis - per Dr. Retta Diones 10/14, on Vanc and Zosyn. 2. ESRD - HD on TTS @ [facility], only runs 3.25 hr, but has excess volume, no Heparin. 3. Hypertension/volume - on low dose coreg bid; BP currently stable (143/58), needs decrease volume, but BP is limiting right now (pain meds ?).  UF goal of 3 L today.. 4. Anemia - Hgb 9.9, Aranesp 40 mcg on Thurs.  CBC pending. 5. Metabolic bone disease - Awaiting info re: VDRA, on Sensipar/renvela tid 6. Nutrition - renal carb-mod diet 7. DM - per primary 8. Dispo - possible discharge today, but NVD overnight- I anticipate he will talk his way out of it but is OK for discharge today from our standpoint.  Will add imodium    LOS: 3 days   LYLES,CHARLES 08/25/2014,7:41 AM  Patient seen and examined, agree with above note with above modifications. Says had horrible night- still in pain- now with diarrhea likely related to the antibiotics. Going for 3 liters with HD today to lower volume- is OK for discharge but suspect these complaints will keep him here- have added imodium  Annie Sable, MD 08/25/2014

## 2014-08-25 NOTE — Progress Notes (Addendum)
[facility] PROGRESS NOTE  LEOCADIO ZEIN TDV:[id number] DOB: 1971-06-15 DOA: 08/22/2014 PCP: [name], MD  Assessment/Plan: Principal Problem:   Postoperative infection Active Problems:   Hypothyroidism   End stage renal disease   Abscess and cellulitis   Hyperlipidemia   Uncontrolled diabetes mellitus with peripheral autonomic neuropathy   Cellulitis of groin    HPI 43 year old male with end-stage renal disease, on hemodialysis and type 1 diabetes who is status post placement of a three-piece inflatable Coloplast penile prosthesis on 07/24/2014 by Dr. Jethro Bolus. The patient over the past week or 2 has had worsening pain, with some scrotal drainage. He presented to the office today for evaluation and was felt to have a possible infection. He was sent to the emergency room for possible admission as well as CT scan. CT revealed ascites, as well as gas along his penile prosthesis. There is worried for gas forming bacteria. The patient has not had any fevers but has had shakes and chills. He has had an eschar on the end of his penis which he has, at times, had to remove to help him urinate, as it covers his meatus. He still makes urine despite his dialysis.  In the emergency room, the patient has significant penile edema and tenderness. There is no crepitus. The skin looks viable. There is minimal scrotal wound discharge. I did review his CT scan, and am worried about a gas forming bacteria along his prosthesis. Patient underwent penile prosthesis explant emergently. CBG was elevated to greater than 600 upon admission    HOSPITAL COURSE:  Status post explant of penile prosthesis secondary to infection  Wound culture shows gram-positive cocci, anaerobes Currently on Zosyn, vancomycin , stopped clindamycin , switch to Augmentin and doxycycline(esrd dozing) when patient able to discharge Can continue with vancomycin with hemodialysis, Tuesday Thursday Saturday  Duration  will be based on culture results , currently scheduled for 2 weeks post discharge  Pt to DC with foley   Diarrhea Rule out C. difficile Enteric precautions Started on florastor  Will place a fl;exiseal,  Patient discharge has been canceled Discontinue PPI  Diabetes  Started on DKA protocol  Restarted back on Lantus home dose and lispro  Accu-Cheks at least 4 times a day    End-stage renal disease on hemodialysis Tuesday Thursday Saturday, next hemodialysis 10/17    Hypertension initially blood pressure soft  Held Lasix, clonidine, and/or  Continue coreg  Resume his medications as blood pressure improves    Nausea? Side effect of his anesthesia vs ileus, KUB negative, resolved     HPI/Subjective: Patient complains about having multiple bowel movements, 3 document bowel movements during hemodialysis, foul-smelling  Objective: Filed Vitals:   08/25/14 0830 08/25/14 0900 08/25/14 0930 08/25/14 1000  BP: 167/42 157/61 131/79 142/41  Pulse: 81 81 61 84  Temp:      TempSrc:      Resp:      Height:      Weight:      SpO2:        Intake/Output Summary (Last 24 hours) at 08/25/14 1052 Last data filed at 08/25/14 0600  Gross per 24 hour  Intake   2670 ml  Output     40 ml  Net   2630 ml    Exam:  General: alert & oriented x 3 In NAD  Cardiovascular: RRR, nl S1 s2  Respiratory: Decreased breath sounds at the bases, scattered rhonchi, no crackles  Abdomen: soft +BS NT/ND, no  masses palpable  Extremities: No cyanosis and no edema      Data Reviewed: Basic Metabolic Panel:  Recent Labs Lab 08/22/14 1654 08/23/14 0326 08/23/14 0552 08/23/14 0725 08/25/14 0730  NA 126* 131* 131* 131* 123*  K 4.5 4.2 4.3 4.0 3.8  CL 83* 91* 90* 91* 84*  CO2 GLUCOSE 583* 98 85 117* 195*  BUN 36* 36* 38* 38* 25*  CREATININE 4.24* 4.65* 4.70* 4.84* 4.23*  CALCIUM 9.2 9.2 8.9 8.8 7.7*  PHOS  --   --   --   --  2.6    Liver Function Tests:  Recent  Labs Lab 08/22/14 1654 08/25/14 0730  AST 11  --   ALT 10  --   ALKPHOS 432*  --   BILITOT 0.5  --   PROT 7.9  --   ALBUMIN 2.8* 2.1*   No results found for this basename: LIPASE, AMYLASE,  in the last 168 hours No results found for this basename: AMMONIA,  in the last 168 hours  CBC:  Recent Labs Lab 08/22/14 1613 08/23/14 0326 08/25/14 0730  WBC 10.3 11.9* 8.8  NEUTROABS 9.0*  --   --   HGB 9.6* 9.9* 8.7*  HCT 31.7* 30.8* 27.3*  MCV 91.9 87.7 87.5  PLT 167 238 151    Cardiac Enzymes: No results found for this basename: CKTOTAL, CKMB, CKMBINDEX, TROPONINI,  in the last 168 hours BNP (last 3 results)  Recent Labs  03/26/14 1555  PROBNP 1212.0*     CBG:  Recent Labs Lab 08/23/14 1944 08/24/14 0906 08/24/14 1215 08/24/14 1709 08/24/14 2110  GLUCAP 152* 378* 336* 210* 145*    Recent Results (from the past 240 hour(s))  WOUND CULTURE     Status: None   Collection Time    08/22/14 11:34 PM      Result Value Ref Range Status   Specimen Description SCROTUM SWAB OF SCROTUM   Final   Special Requests NONE   Final   Gram Stain     Final   Value: ABUNDANT WBC PRESENT, PREDOMINANTLY PMN     NO SQUAMOUS EPITHELIAL CELLS SEEN     FEW GRAM POSITIVE COCCI     IN CLUSTERS     Performed at [facility]   Culture     Final   Value: MULTIPLE ORGANISMS PRESENT, NONE PREDOMINANT     NO NEISSERIA GONORRHOEAE ISOLATED     Note: NO STAPHYLOCOCCUS AUREUS ISOLATED NO GROUP A STREP (S.PYOGENES) ISOLATED     Performed at [facility]   Report Status 08/25/2014 FINAL   Final  ANAEROBIC CULTURE     Status: None   Collection Time    08/22/14 11:34 PM      Result Value Ref Range Status   Specimen Description SCROTUM SWAB OF SCROTUM   Final   Special Requests NONE   Final   Gram Stain     Final   Value: ABUNDANT WBC PRESENT, PREDOMINANTLY PMN     NO SQUAMOUS EPITHELIAL CELLS SEEN     FEW GRAM POSITIVE COCCI     IN CLUSTERS     Performed at [facility]   Culture     Final   Value: NO ANAEROBES ISOLATED; CULTURE IN PROGRESS FOR 5 DAYS     Performed at [facility]   Report Status PENDING   Incomplete  MRSA PCR SCREENING     Status: None   Collection  Time    08/23/14  4:16 AM      Result Value Ref Range Status   MRSA by PCR NEGATIVE  NEGATIVE Final   Comment:            The GeneXpert MRSA Assay (FDA     approved for NASAL specimens     only), is one component of a     comprehensive MRSA colonization     surveillance program. It is not     intended to diagnose MRSA     infection nor to guide or     monitor treatment for     MRSA infections.     Studies: Ct Abdomen Pelvis Wo Contrast  08/22/2014   CLINICAL DATA:  42 year old male with diabetes and end-stage renal disease. Penile prosthesis implanted 07/24/2014. Increasing pain and swelling  EXAM: CT ABDOMEN AND PELVIS WITHOUT CONTRAST  TECHNIQUE: Multidetector CT imaging of the abdomen and pelvis was performed following the standard protocol without IV contrast.  COMPARISON:  None.  FINDINGS: Lower chest:  Surgical changes of prior median sternotomy. Otherwise unremarkable appearance of the subcutaneous tissues of the lower chest wall.  Heart size within normal limits. Retained epicardial pacing lead courses from the pericardium to the midline soft tissues.  No pericardial fluid/thickening.  No confluent airspace disease pleural fluid or pneumothorax identified. There are several sub cm nodules of the bilateral lungs, including the right middle lobe on image 6 and the left lower lobe on image 4.  Abdomen/pelvis:  Diffusely decreased attenuation of liver parenchyma, with Hounsfield units measuring 47. Unremarkable appearance of the spleen.  Atrophy parenchyma of the pancreas, with no associated inflammatory changes or fluid.  No pericholecystic fluid or inflammatory changes. No radiopaque gallstones. No intrahepatic or extrahepatic biliary duct dilatation.  Unremarkable  appearance of the bilateral adrenal glands.  Native kidneys are atrophied with calcifications of the vasculature the bilateral renal hila. No hydronephrosis. Bilateral hypodense foci within the kidney parenchyma, too small to completely characterize by CT.  Hypodense fluid within the abdomen, including bilateral subdiaphragmatic space, perihepatic space, perisplenic space, and extending within the peritoneum in the interloop space to the pelvis. No abnormally distended small bowel or colon.  Penile prosthesis in situ. The reservoir is present in the right inguinal region.  There is extensive gas throughout the tissues of the corpora cavernosum bilaterally, as well as along the course of the tube being within the scrotum. Scrotal edema/ fluid present.  Scattered calcifications of the abdominal aorta. More extensive calcifications of the mesenteric vessels and of the small vessels of the pelvis, compatible with the history of longstanding diabetes. Calcifications of the vasculature of the upper lower extremities.  IMPRESSION: Extensive gas along the course of penile implant within the corpora cavernosa bilaterally, concerning for gas-forming organism in the setting of infection.  Fluid within the peritoneal cavity, may reflect inflammation, or alternatively edema/ anasarca with positive fluid balance.  Extensive small vessel calcification, compatible with atherosclerosis in the setting of long-standing diabetes.  These results were called by telephone at the time of interpretation on 08/22/2014 at 9:05 pm to the nurse caring for the patient, Mr. Percival Spanish, who verbally acknowledged these results.  Signed,  Yvone Neu. Loreta Ave, DO  [facility]  [facility]   Electronically Signed   By: Gilmer Mor D.O.   On: 08/22/2014 21:08   Dg Abd 1 View  08/23/2014   CLINICAL DATA:  Nausea and vomiting.  Initial encounter.  EXAM: ABDOMEN -  1 VIEW  COMPARISON:  None.  FINDINGS:  There is no evidence of bowel obstruction. No concern intra-abdominal mass effect or calcification. A surgical drain overlaps the right inguinal region. Diffuse arterial calcification. No acute osseous findings.  IMPRESSION: No evidence for bowel obstruction or ileus.   Electronically Signed   By: Tiburcio Pea M.D.   On: 08/23/2014 23:14    Scheduled Meds: . aspirin  325 mg Oral Q breakfast  . carvedilol  6.25 mg Oral BID WC  . cinacalcet  60 mg Oral Q breakfast  . darbepoetin (ARANESP) injection - DIALYSIS  40 mcg Intravenous Q Thu-HD  . doxercalciferol  1 mcg Intravenous Q T,Th,Sa-HD  . heparin  5,000 Units Subcutaneous 3 times per day  . insulin aspart  0-9 Units Subcutaneous TID WC  . insulin glargine  36 Units Subcutaneous Daily  . levothyroxine  75 mcg Oral QAC breakfast  . omega-3 acid ethyl esters  1 g Oral Daily  . pantoprazole  40 mg Oral QHS  . piperacillin-tazobactam (ZOSYN)  IV  2.25 g Intravenous Q8H  . pregabalin  150 mg Oral Daily  . pregabalin  300 mg Oral QHS  . rosuvastatin  20 mg Oral QHS  . saccharomyces boulardii  250 mg Oral BID  . sevelamer carbonate  800 mg Oral TID WC  . vancomycin  1,000 mg Intravenous Q T,Th,Sa-HD   Continuous Infusions: . 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 If 7PM-7AM, please contact night-coverage at [email or web address], password [facility] 08/25/2014, 10:52 AM  LOS: 3 days

## 2014-08-25 NOTE — Progress Notes (Signed)
Physical Therapy Treatment Patient Details Name: Taylor [name] MRN: [id number] DOB: 05/06/1971 Today's Date: 2014-09-15    History of Present Illness Pt. admitted 08/22/14 with infected penile implant, underwent emergent explant  same date.  Pt. with h/o DM I, ESRD on HD TuThSat, HTN.      PT Comments    Pt very limited today by frequent loose stools. Unable to amb due to fatigue after multiple transfers to/from [facility]. Pt and father have decided they do need wheelchair at DC.  Follow Up Recommendations  Home health PT;Supervision/Assistance - 24 hour;Supervision for mobility/OOB     Equipment Recommendations  Rolling walker with 5" wheels;Wheelchair (measurements PT);Wheelchair cushion (measurements PT);Other (comment) (Father and pt have decided they would like wheelchair)    Recommendations for Other Services       Precautions / Restrictions Precautions Precautions: Fall    Mobility  Bed Mobility           Sit to supine: Supervision      Transfers Overall transfer level: Needs assistance Equipment used: 1 person hand held assist Transfers: Sit to/from [employer] Sit to Stand: Min assist Stand pivot transfers: Min guard       General transfer comment: Assist for balance. Pivot from bsc to bed  Ambulation/Gait             General Gait Details: Pt too fatigued from frequent trips to the [facility].   Stairs            Wheelchair Mobility    Modified Rankin (Stroke Patients Only)       Balance   Sitting-balance support: No upper extremity supported;Feet supported Sitting balance-Leahy Scale: Good       Standing balance-Leahy Scale: Poor Standing balance comment: Min A to maintain.                    Cognition Arousal/Alertness: Awake/alert Behavior During Therapy: WFL for tasks assessed/performed Overall Cognitive Status: Within Functional Limits for tasks assessed                      Exercises       General Comments        Pertinent Vitals/Pain      Home Living                      Prior Function            PT Goals (current goals can now be found in the care plan section) Progress towards PT goals: Not progressing toward goals - comment (due to frequent diarrhea)    Frequency  Min 3X/week    PT Plan Discharge plan needs to be updated    Co-evaluation             End of Session   Activity Tolerance: Patient limited by fatigue Patient left: in bed;with call bell/phone within reach;with bed alarm set     Time: 1450-1501 PT Time Calculation (min): 11 min  Charges:  $Gait Training: 8-22 mins                    G Codes:      Suttyn Cryder September 15, 2014, 3:07 PM  [employer] PT [phone]

## 2014-08-26 LAB — GLUCOSE, CAPILLARY
GLUCOSE-CAPILLARY: 88 mg/dL (ref 70–99)
Glucose-Capillary: 68 mg/dL — ABNORMAL LOW (ref 70–99)
Glucose-Capillary: 97 mg/dL (ref 70–99)
Glucose-Capillary: 88 mg/dL (ref 70–99)
Glucose-Capillary: 107 mg/dL — ABNORMAL HIGH (ref 70–99)

## 2014-08-26 NOTE — Progress Notes (Signed)
[facility] PROGRESS NOTE  Taylor [name] WGN:[id number] DOB: 1971-06-27 DOA: 08/22/2014 PCP: [name], MD  Assessment/Plan: Principal Problem:   Postoperative infection Active Problems:   Hypothyroidism   End stage renal disease   Abscess and cellulitis   Hyperlipidemia   Uncontrolled diabetes mellitus with peripheral autonomic neuropathy   Cellulitis of groin   HPI 42 year old male with end-stage renal disease, on hemodialysis and type 1 diabetes who is status post placement of a three-piece inflatable Coloplast penile prosthesis on 07/24/2014 by Dr. Jethro Bolus. The patient over the past week or 2 has had worsening pain, with some scrotal drainage. He presented to the office today for evaluation and was felt to have a possible infection. He was sent to the emergency room for possible admission as well as CT scan. CT revealed ascites, as well as gas along his penile prosthesis. There is worried for gas forming bacteria. The patient has not had any fevers but has had shakes and chills. He has had an eschar on the end of his penis which he has, at times, had to remove to help him urinate, as it covers his meatus. He still makes urine despite his dialysis.  In the emergency room, the patient has significant penile edema and tenderness. There is no crepitus. The skin looks viable. There is minimal scrotal wound discharge. I did review his CT scan, and am worried about a gas forming bacteria along his prosthesis. Patient underwent penile prosthesis explant emergently. CBG was elevated to greater than 600 upon admission    HOSPITAL COURSE:  Status post explant of penile prosthesis secondary to infection  Wound culture shows gram-positive cocci, anaerobes  Currently on Zosyn, vancomycin , stopped clindamycin , switch to Augmentin and doxycycline(esrd dozing) when patient able to discharge  Can continue with vancomycin with hemodialysis, Tuesday Thursday Saturday  Duration  will be based on culture results , currently scheduled for 2 weeks post discharge  Pt to DC home with foley and followup with Dr. Patsi Sears tomorrow  Diarrhea  Negative for C. difficile Enteric precautions  Started on florastor  There was no need to place fl;exiseal,  Patient discharge has been canceled  Discontinue PPI  May give Imodium if diarrhea worsens  Diabetes  Started on DKA protocol  Restarted back on Lantus home dose and lispro  Accu-Cheks at least 4 times a day   End-stage renal disease on hemodialysis Tuesday Thursday Saturday, next hemodialysis 10/17   Hypertension initially blood pressure soft  Held Lasix, clonidine, and/or  Continue coreg  Resume his medications as blood pressure improves    Nausea? Side effect of his anesthesia vs ileus, KUB negative, resolved     HPI/Subjective: Patient's diarrhea has improved Eating, refusing to discharge home today and he has an appointment tomorrow with Dr. Patsi Sears has an appointment tomorrow at 10 AM  Objective: Filed Vitals:   08/25/14 2159 08/26/14 0431 08/26/14 0645 08/26/14 1009  BP: 110/52 179/77 130/63 149/65  Pulse: 84 98 83 87  Temp: 98.4 F (36.9 C) 98.8 F (37.1 C)  98.4 F (36.9 C)  TempSrc: Oral Oral  Oral  Resp: Height:      Weight:      SpO2: 99% 100%  100%    Intake/Output Summary (Last 24 hours) at 08/26/14 1138 Last data filed at 08/26/14 0900  Gross per 24 hour  Intake   1230 ml  Output    150 ml  Net   1080  ml    Exam: General appearance: Alert, in no apparent distress  Resp: CTA without rales, rhonchi, or wheezes  Cardio: RRR without murmur or rub  GI: + BS, soft and nontender  Extremities: Trace pretibial edema bilaterally  Access: AVG @ LUA with + bruit       Data Reviewed: Basic Metabolic Panel:  Recent Labs Lab 08/22/14 1654 08/23/14 0326 08/23/14 0552 08/23/14 0725 08/25/14 0730  NA 126* 131* 131* 131* 123*  K 4.5 4.2 4.3 4.0 3.8  CL 83* 91*  90* 91* 84*  CO2 22 23 24 23 21   GLUCOSE 583* 98 85 117* 195*  BUN 36* 36* 38* 38* 25*  CREATININE 4.24* 4.65* 4.70* 4.84* 4.23*  CALCIUM 9.2 9.2 8.9 8.8 7.7*  PHOS  --   --   --   --  2.6    Liver Function Tests:  Recent Labs Lab 08/22/14 1654 08/25/14 0730  AST 11  --   ALT 10  --   ALKPHOS 432*  --   BILITOT 0.5  --   PROT 7.9  --   ALBUMIN 2.8* 2.1*   No results found for this basename: LIPASE, AMYLASE,  in the last 168 hours No results found for this basename: AMMONIA,  in the last 168 hours  CBC:  Recent Labs Lab 08/22/14 1613 08/23/14 0326 08/25/14 0730  WBC 10.3 11.9* 8.8  NEUTROABS 9.0*  --   --   HGB 9.6* 9.9* 8.7*  HCT 31.7* 30.8* 27.3*  MCV 91.9 87.7 87.5  PLT 167 238 151    Cardiac Enzymes: No results found for this basename: CKTOTAL, CKMB, CKMBINDEX, TROPONINI,  in the last 168 hours BNP (last 3 results)  Recent Labs  03/26/14 1555  PROBNP 1212.0*     CBG:  Recent Labs Lab 08/25/14 1230 08/25/14 1711 08/25/14 2157 08/26/14 0732 08/26/14 0836  GLUCAP 165* 215* 152* 68* 97    Recent Results (from the past 240 hour(s))  WOUND CULTURE     Status: None   Collection Time    08/22/14 11:34 PM      Result Value Ref Range Status   Specimen Description SCROTUM SWAB OF SCROTUM   Final   Special Requests NONE   Final   Gram Stain     Final   Value: ABUNDANT WBC PRESENT, PREDOMINANTLY PMN     NO SQUAMOUS EPITHELIAL CELLS SEEN     FEW GRAM POSITIVE COCCI     IN CLUSTERS     Performed at 08/24/14   Culture     Final   Value: MULTIPLE ORGANISMS PRESENT, NONE PREDOMINANT     NO NEISSERIA GONORRHOEAE ISOLATED     Note: NO STAPHYLOCOCCUS AUREUS ISOLATED NO GROUP A STREP (S.PYOGENES) ISOLATED     Performed at [facility]   Report Status 08/25/2014 FINAL   Final  ANAEROBIC CULTURE     Status: None   Collection Time    08/22/14 11:34 PM      Result Value Ref Range Status   Specimen Description SCROTUM SWAB OF SCROTUM    Final   Special Requests NONE   Final   Gram Stain     Final   Value: ABUNDANT WBC PRESENT, PREDOMINANTLY PMN     NO SQUAMOUS EPITHELIAL CELLS SEEN     FEW GRAM POSITIVE COCCI     IN CLUSTERS     Performed at 08/24/14   Culture     Final  Value: NO ANAEROBES ISOLATED; CULTURE IN PROGRESS FOR 5 DAYS     Performed at [facility]   Report Status PENDING   Incomplete  MRSA PCR SCREENING     Status: None   Collection Time    08/23/14  4:16 AM      Result Value Ref Range Status   MRSA by PCR NEGATIVE  NEGATIVE Final   Comment:            The GeneXpert MRSA Assay (FDA     approved for NASAL specimens     only), is one component of a     comprehensive MRSA colonization     surveillance program. It is not     intended to diagnose MRSA     infection nor to guide or     monitor treatment for     MRSA infections.  CLOSTRIDIUM DIFFICILE BY PCR     Status: None   Collection Time    08/25/14  3:57 PM      Result Value Ref Range Status   C difficile by pcr NEGATIVE  NEGATIVE Final     Studies: Ct Abdomen Pelvis Wo Contrast  08/22/2014   CLINICAL DATA:  43 year old male with diabetes and end-stage renal disease. Penile prosthesis implanted 07/24/2014. Increasing pain and swelling  EXAM: CT ABDOMEN AND PELVIS WITHOUT CONTRAST  TECHNIQUE: Multidetector CT imaging of the abdomen and pelvis was performed following the standard protocol without IV contrast.  COMPARISON:  None.  FINDINGS: Lower chest:  Surgical changes of prior median sternotomy. Otherwise unremarkable appearance of the subcutaneous tissues of the lower chest wall.  Heart size within normal limits. Retained epicardial pacing lead courses from the pericardium to the midline soft tissues.  No pericardial fluid/thickening.  No confluent airspace disease pleural fluid or pneumothorax identified. There are several sub cm nodules of the bilateral lungs, including the right middle lobe on image 6 and the left lower lobe  on image 4.  Abdomen/pelvis:  Diffusely decreased attenuation of liver parenchyma, with Hounsfield units measuring 47. Unremarkable appearance of the spleen.  Atrophy parenchyma of the pancreas, with no associated inflammatory changes or fluid.  No pericholecystic fluid or inflammatory changes. No radiopaque gallstones. No intrahepatic or extrahepatic biliary duct dilatation.  Unremarkable appearance of the bilateral adrenal glands.  Native kidneys are atrophied with calcifications of the vasculature the bilateral renal hila. No hydronephrosis. Bilateral hypodense foci within the kidney parenchyma, too small to completely characterize by CT.  Hypodense fluid within the abdomen, including bilateral subdiaphragmatic space, perihepatic space, perisplenic space, and extending within the peritoneum in the interloop space to the pelvis. No abnormally distended small bowel or colon.  Penile prosthesis in situ. The reservoir is present in the right inguinal region.  There is extensive gas throughout the tissues of the corpora cavernosum bilaterally, as well as along the course of the tube being within the scrotum. Scrotal edema/ fluid present.  Scattered calcifications of the abdominal aorta. More extensive calcifications of the mesenteric vessels and of the small vessels of the pelvis, compatible with the history of longstanding diabetes. Calcifications of the vasculature of the upper lower extremities.  IMPRESSION: Extensive gas along the course of penile implant within the corpora cavernosa bilaterally, concerning for gas-forming organism in the setting of infection.  Fluid within the peritoneal cavity, may reflect inflammation, or alternatively edema/ anasarca with positive fluid balance.  Extensive small vessel calcification, compatible with atherosclerosis in the setting of long-standing diabetes.  These results were  called by telephone at the time of interpretation on 08/22/2014 at 9:05 pm to the nurse caring for the  patient, Mr. Percival Spanish, who verbally acknowledged these results.  Signed,  Yvone Neu. Loreta Ave, DO  [facility]  [facility]   Electronically Signed   By: Gilmer Mor D.O.   On: 08/22/2014 21:08   Dg Abd 1 View  08/23/2014   CLINICAL DATA:  Nausea and vomiting.  Initial encounter.  EXAM: ABDOMEN - 1 VIEW  COMPARISON:  None.  FINDINGS: There is no evidence of bowel obstruction. No concern intra-abdominal mass effect or calcification. A surgical drain overlaps the right inguinal region. Diffuse arterial calcification. No acute osseous findings.  IMPRESSION: No evidence for bowel obstruction or ileus.   Electronically Signed   By: Tiburcio Pea M.D.   On: 08/23/2014 23:14    Scheduled Meds: . aspirin  325 mg Oral Q breakfast  . carvedilol  6.25 mg Oral BID WC  . cinacalcet  60 mg Oral Q breakfast  . darbepoetin (ARANESP) injection - DIALYSIS  40 mcg Intravenous Q Thu-HD  . doxercalciferol  1 mcg Intravenous Q T,Th,Sa-HD  . famotidine  20 mg Oral BID  . heparin  5,000 Units Subcutaneous 3 times per day  . insulin aspart  0-9 Units Subcutaneous TID WC  . insulin glargine  36 Units Subcutaneous Daily  . levothyroxine  75 mcg Oral QAC breakfast  . omega-3 acid ethyl esters  1 g Oral Daily  . piperacillin-tazobactam (ZOSYN)  IV  2.25 g Intravenous Q8H  . pregabalin  150 mg Oral Daily  . pregabalin  300 mg Oral QHS  . rosuvastatin  20 mg Oral QHS  . saccharomyces boulardii  250 mg Oral BID  . sevelamer carbonate  800 mg Oral TID WC  . vancomycin  1,000 mg Intravenous Q T,Th,Sa-HD   Continuous Infusions: . 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 If 7PM-7AM, please contact night-coverage at [email or web address], password [facility] 08/26/2014, 11:38 AM  LOS: 4 days

## 2014-08-26 NOTE — Progress Notes (Signed)
Subjective:  Feeling much better, no further nausea or vomiting, loose stool once last night and again this morning- HD yest, removed 2900  Objective: Vital signs in last 24 hours: Temp:  [98.1 F (36.7 C)-98.8 F (37.1 C)] 98.8 F (37.1 C) (10/18 0431) Pulse Rate:  [61-98] 83 (10/18 0645) Resp:  [16-18] 18 (10/18 0431) BP: (104-179)/(37-79) 130/63 mmHg (10/18 0645) SpO2:  [97 %-100 %] 100 % (10/18 0431) Weight:  [80.6 kg (177 lb 11.1 oz)] 80.6 kg (177 lb 11.1 oz) (10/17 1049) Weight change: -1.531 kg (-3 lb 6 oz)  Intake/Output from previous day: 10/17 0701 - 10/18 0700 In: 870 [P.O.:720; IV Piggyback:150] Out: 3050 [Urine:150] Intake/Output this shift:   Lab Results:  Recent Labs  08/25/14 0730  WBC 8.8  HGB 8.7*  HCT 27.3*  PLT 151   BMET:  Recent Labs  08/25/14 0730  NA 123*  K 3.8  CL 84*  CO2 21  GLUCOSE 195*  BUN 25*  CREATININE 4.23*  CALCIUM 7.7*  ALBUMIN 2.1*   No results found for this basename: PTH,  in the last 72 hours Iron Studies: No results found for this basename: IRON, TIBC, TRANSFERRIN, FERRITIN,  in the last 72 hours  Studies/Results: No results found.  EXAM:  General appearance: Alert, in no apparent distress  Resp: CTA without rales, rhonchi, or wheezes  Cardio: RRR without murmur or rub  GI: + BS, soft and nontender  Extremities: Trace pretibial edema bilaterally  Access: AVG @ LUA with + bruit  Dialysis Orders: [facility] - [address] EDW 81 (per pt)  ACTUAL orders - 3.25 hr EDW 80 450/800 2 K 2.5 Ca NO Heparin left upper AVGG - Hectorol 1 Epo 6000 - kinetics marginal at 3.25 hr  Assessment/Plan: 1. s/p removal of infected penile prosthesis - per Dr. Retta Diones 10/14, on Vanc and Zosyn.  2. ESRD - HD on TTS @ [facility], only runs 3.25 hr, but has excess volume, no Heparin. HD yest removed 2900 3. HTN/volume - on low dose coreg bid; BP currently stable (130/63), needs decrease volume, but BP is limiting right now (pain  meds ?), s/p net UF 2.9 L yesterday. Needs a lower EDW at discharge  4. Anemia - Hgb 8.7, Aranesp 40 mcg on Thurs.  5. Metabolic bone disease - Awaiting info re: VDRA, on Sensipar/renvela tid. 6. Nutrition - renal carb-mod diet  7. DM - per primary  8. Dispo - likely discharge pending, NVD resolved, C diff negative. Says he plans to be discharged tomorrow and go right to follow up at [facility] ?    LOS: 4 days   LYLES,CHARLES 08/26/2014,7:41 AM  Patient seen and examined, agree with above note with above modifications. Looks and feels better C diff neg- tolerated fluid removal with HD yest- likely needs lower EDW at discharge- Home today or tomorrow- per primary  Annie Sable, MD 08/26/2014

## 2014-08-26 NOTE — Progress Notes (Signed)
Hypoglycemic Event  CBG: 68  Treatment: Refused treatment protocol.Patient said''i just gonna eat my breakfast'''  Symptoms: None  Follow-up CBG: Time -0836 CBG Result:86  Possible Reasons for Event: Uknown  Comments/MD notified:yes    Taylor [name], Taylor [name]  Remember to initiate Hypoglycemia Order Set & complete

## 2014-08-27 LAB — GLUCOSE, CAPILLARY
GLUCOSE-CAPILLARY: 90 mg/dL (ref 70–99)
Glucose-Capillary: 83 mg/dL (ref 70–99)

## 2014-08-27 LAB — ANAEROBIC CULTURE

## 2014-08-27 MED ORDER — CARVEDILOL 12.5 MG PO TABS
12.5000 mg | ORAL_TABLET | Freq: Two times a day (BID) | ORAL | Status: DC
  Administered 2014-08-27: 12.5 mg via ORAL
  Filled 2014-08-27 (×3): qty 1

## 2014-08-27 MED ORDER — VANCOMYCIN HCL IN DEXTROSE 1-5 GM/200ML-% IV SOLN: 1000.0000 mg | INTRAVENOUS | Status: AC

## 2014-08-27 MED ORDER — CEFTAZIDIME 2 G IJ SOLR: 2.0000 g | INTRAMUSCULAR | Status: AC

## 2014-08-27 MED ORDER — CEFTAZIDIME 2 G IJ SOLR: 2.0000 g | INTRAMUSCULAR | Status: DC

## 2014-08-27 MED ORDER — SACCHAROMYCES BOULARDII 250 MG PO CAPS: 250.0000 mg | ORAL_CAPSULE | Freq: Two times a day (BID) | ORAL | Status: DC

## 2014-08-27 MED ORDER — DEXTROSE 5 % IV SOLN
2.0000 g | Freq: Once | INTRAVENOUS | Status: AC
  Administered 2014-08-27: 2 g via INTRAVENOUS
  Filled 2014-08-27: qty 2

## 2014-08-27 MED ORDER — DEXTROSE 5 % IV SOLN: 2.0000 g | INTRAVENOUS | Status: DC

## 2014-08-27 MED ORDER — VANCOMYCIN HCL IN DEXTROSE 1-5 GM/200ML-% IV SOLN: 1000.0000 mg | INTRAVENOUS | Status: DC

## 2014-08-27 MED ORDER — PREGABALIN 50 MG PO CAPS: 50.0000 mg | ORAL_CAPSULE | Freq: Two times a day (BID) | ORAL | Status: DC

## 2014-08-27 NOTE — Progress Notes (Signed)
CARE MANAGEMENT NOTE 08/27/2014  Patient:  Taylor [name], Taylor [name]   Account Number:  [id number]  Date Initiated:  08/24/2014  Documentation initiated by:  Lizabeth Leyden  Subjective/Objective Assessment:   admittd with groin pain,cellulitis     Action/Plan:   progression of care and discharge planning   Anticipated DC Date:  08/24/2014   Anticipated DC Plan:  [place]  CM consult      [facility] Choice  HOME HEALTH   Choice offered to / List presented to:  C-1 Patient   DME arranged  [place]      DME agency  [place] arranged  HH-1 RN  [place].   Status of service:  Completed, signed off Medicare Important Message given?  YES (If response is "NO", the following Medicare IM given date fields will be blank) Date Medicare IM given:  08/24/2014 Medicare IM given by:  ROYAL,CHERYL Date Additional Medicare IM given:   Additional Medicare IM given by:    Discharge Disposition:  [place]  Per UR Regulation:    If discussed at Long Length of Stay Meetings, dates discussed:    Comments:  08/27/2014  [place] RN,CCM [phone] CM referral:  RN, PT, OT                     DME:  RW with seat, wheelchair with cushion  [facility]/ Lelan Pons,  NCM called with update home health orders RN, PT, OT  [facility]/ transition to home team, NCM called with DME referral, left voice mail message  08/24/2014  [place], [place] [phone] CM referral: home health RN  Met with patient regarding choice for home health services. He selected [facility].  [facility]/ Butch Penny. NCM called left voice mail with referral for [facility].

## 2014-08-27 NOTE — Progress Notes (Signed)
Physical Therapy Treatment Patient Details Name: Taylor [name] MRN: [id number] DOB: 1971-09-18 Today's Date: 08/27/2014    History of Present Illness Pt. admitted 08/22/14 with infected penile implant, underwent emergent explant  same date.  Pt. with h/o DM I, ESRD on HD TuThSat, HTN.      PT Comments    Pt reports today is a "good day", and further explains that his feet are not painful from neuropathy, and he feels he is able to walk a fair distance without fatiguing. Pt ambulated a total of 200' today. We discussed further the need for w/c use for appointments, however to continue with ambulation and exercise within the home as tolerated to maintain strength. Pt was educated on HEP. Will continue to follow and progress as able per POC.   Follow Up Recommendations  Home health PT;Supervision/Assistance - 24 hour;Supervision for mobility/OOB     Equipment Recommendations  Rolling walker with 5" wheels;Wheelchair (measurements PT);Wheelchair cushion (measurements PT)    Recommendations for Other Services       Precautions / Restrictions Precautions Precautions: Fall Restrictions Weight Bearing Restrictions: No    Mobility  Bed Mobility Overal bed mobility: Needs Assistance Bed Mobility: Supine to Sit     Supine to sit: Supervision     General bed mobility comments: No physical assist required. Pt was able to transition to EOB with increased time and heavy use of bed rails initially. Cues to scoot all the way to EOB so feet were supported.   Transfers Overall transfer level: Needs assistance Equipment used: Rolling walker (2 wheeled) Transfers: Sit to/from Stand Sit to Stand: Min guard         General transfer comment: Pt was able to transition to full standing without physical assist. Min guard for safety only.   Ambulation/Gait Ambulation/Gait assistance: Min guard;Min assist Ambulation Distance (Feet): 200 Feet Assistive device: Rolling walker (2  wheeled) Gait Pattern/deviations: Step-through pattern;Decreased stride length Gait velocity: Decreased Gait velocity interpretation: Below normal speed for age/gender General Gait Details: Pt was able to ambulate with RW and occasional min assist for balance. Posterior LOB especially with turns and sudden directional changes.    Stairs            Wheelchair Mobility    Modified Rankin (Stroke Patients Only)       Balance Overall balance assessment: Needs assistance Sitting-balance support: Feet supported;No upper extremity supported Sitting balance-Leahy Scale: Good       Standing balance-Leahy Scale: Poor Standing balance comment: Requires UE support for standing balance.                     Cognition Arousal/Alertness: Awake/alert Behavior During Therapy: WFL for tasks assessed/performed Overall Cognitive Status: Within Functional Limits for tasks assessed                      Exercises General Exercises - Lower Extremity Quad Sets: 15 reps Hip ABduction/ADduction: 15 reps    General Comments        Pertinent Vitals/Pain Pain Assessment: No/denies pain    Home Living                      Prior Function            PT Goals (current goals can now be found in the care plan section) Acute Rehab PT Goals Patient Stated Goal: home and eventually resume driving self to HD PT Goal Formulation: With patient Time  For Goal Achievement: 08/31/14 Potential to Achieve Goals: Good Progress towards PT goals: Progressing toward goals    Frequency  Min 3X/week    PT Plan Current plan remains appropriate    Co-evaluation             End of Session Equipment Utilized During Treatment: Gait belt Activity Tolerance: Patient tolerated treatment well Patient left: in chair;with call bell/phone within reach     Time: 1028-1100 PT Time Calculation (min): 32 min  Charges:  $Gait Training: 8-22 mins $Therapeutic Activity: 8-22  mins                    G Codes:      Conni Slipper 08/30/2014, 1:34 PM  Conni Slipper, PT, DPT Acute Rehabilitation Services Pager: [phone]

## 2014-08-27 NOTE — Plan of Care (Signed)
Problem: Phase III Progression Outcomes Goal: Pain controlled on oral analgesia Outcome: Adequate for Discharge Being discharged today on PO pain meds. Goal: Voiding independently Outcome: Adequate for Discharge Will be discharged with foley in place.

## 2014-08-27 NOTE — Progress Notes (Signed)
[name] Progress Note  Assessment/Plan: 1. s/p removal of infected penile prosthesis - per Dr. Retta Diones 10/14, d/c on Vanc and Fortaz through 11/2 - will inform his HD center 2. ESRD - HD on TTS @ [facility], only runs 3.25 hr, but has excess volume, no Heparin. HD Sat removed 2900 3. HTN/volume - on low dose coreg bid; BP currently stable  needs decrease volume,- lower edw for d/c to 79 4. Anemia - Hgb 8.7, Aranesp 40 mcg on Thurs.  5. Metabolic bone disease - on Sensipar/renvela tid. 6. Nutrition - renal carb-mod diet  7. DM - per primary  8. Dispo - d/c today  Sheffield Slider, PA-C [facility] Beeper [phone] 08/27/2014,10:35 AM  LOS: 5 days   Pt seen, examined and agree w A/P as above.  Vinson Moselle MD pager [phone]    cell [phone] 08/27/2014, 1:37 PM    Subjective:   Feels better, denies SOB when supine  Objective Filed Vitals:   08/26/14 1751 08/26/14 2100 08/27/14 0500 08/27/14 0923  BP: 146/67 111/60 128/70 147/74  Pulse: 85 81 84 89  Temp: 98.4 F (36.9 C) 98 F (36.7 C) 97.9 F (36.6 C) 98.4 F (36.9 C)  TempSrc: Oral Oral Oral Oral  Resp: 21 18 18 18   Height:      Weight:  82.6 kg (182 lb 1.6 oz)    SpO2: 100% 100% 100% 100%   Physical Exam General: NAD sitting up + JVD Heart: RRR bruit radiates to heart Lungs: few crackles bilaterally Abdomen: soft Extremities: 1 + LE edema Dialysis Access: left upper AVGG patent - bounding bruit  Dialysis Orders: [facility] - 3.25 hr EDW 80 450/800 2 K 2.5 Ca NO Heparin left upper AVGG - Hectorol 1 Epo 6000 - kinetics marginal at 3.25 hr  Additional Objective Labs: Basic Metabolic Panel:  Recent Labs Lab 08/23/14 0552 08/23/14 0725 08/25/14 0730  NA 131* 131* 123*  K 4.3 4.0 3.8  CL 90* 91* 84*  CO2 24 23 21   GLUCOSE 85 117* 195*  BUN 38* 38* 25*  CREATININE 4.70* 4.84* 4.23*  CALCIUM 8.9 8.8 7.7*  PHOS  --   --  2.6   Liver Function  Tests:  Recent Labs Lab 08/22/14 1654 08/25/14 0730  AST 11  --   ALT 10  --   ALKPHOS 432*  --   BILITOT 0.5  --   PROT 7.9  --   ALBUMIN 2.8* 2.1*   CBC:  Recent Labs Lab 08/22/14 1613 08/23/14 0326 08/25/14 0730  WBC 10.3 11.9* 8.8  NEUTROABS 9.0*  --   --   HGB 9.6* 9.9* 8.7*  HCT 31.7* 30.8* 27.3*  MCV 91.9 87.7 87.5  PLT 167 238 151   Blood Culture    Component Value Date/Time   SDES SCROTUM SWAB OF SCROTUM 08/22/2014 2334   SDES SCROTUM SWAB OF SCROTUM 08/22/2014 2334   SPECREQUEST NONE 08/22/2014 2334   SPECREQUEST NONE 08/22/2014 2334   CULT  Value: MULTIPLE ORGANISMS PRESENT, NONE PREDOMINANT NO NEISSERIA GONORRHOEAE ISOLATED Note: NO STAPHYLOCOCCUS AUREUS ISOLATED NO GROUP A STREP (S.PYOGENES) ISOLATED Performed at 08/24/2014 08/22/2014 2334   CULT  Value: NO ANAEROBES ISOLATED; CULTURE IN PROGRESS FOR 5 DAYS Performed at 08/24/2014 08/22/2014 2334   REPTSTATUS 08/25/2014 FINAL 08/22/2014 2334   REPTSTATUS PENDING 08/22/2014 2334    Recent Labs Lab 08/26/14 0836 08/26/14 1154 08/26/14 1656 08/26/14 2139 08/27/14 0738  GLUCAP 97 88 88  107* 83  Medications: . dextrose 5 % and 0.45% NaCl 50 mL/hr at 08/24/14 1948   . aspirin  325 mg Oral Q breakfast  . carvedilol  12.5 mg Oral BID WC  . cinacalcet  60 mg Oral Q breakfast  . darbepoetin (ARANESP) injection - DIALYSIS  40 mcg Intravenous Q Thu-HD  . doxercalciferol  1 mcg Intravenous Q T,Th,Sa-HD  . famotidine  20 mg Oral BID  . heparin  5,000 Units Subcutaneous 3 times per day  . insulin aspart  0-9 Units Subcutaneous TID WC  . insulin glargine  36 Units Subcutaneous Daily  . levothyroxine  75 mcg Oral QAC breakfast  . omega-3 acid ethyl esters  1 g Oral Daily  . piperacillin-tazobactam (ZOSYN)  IV  2.25 g Intravenous Q8H  . pregabalin  150 mg Oral Daily  . pregabalin  300 mg Oral QHS  . rosuvastatin  20 mg Oral QHS  . saccharomyces boulardii  250 mg Oral BID  . sevelamer  carbonate  800 mg Oral TID WC  . vancomycin  1,000 mg Intravenous Q T,Th,Sa-HD

## 2014-08-27 NOTE — Plan of Care (Signed)
Problem: Phase I Progression Outcomes Goal: Voiding-avoid urinary catheter unless indicated Outcome: Adequate for Discharge Patient to be discharged home with foley catheter.  Problem: Phase II Progression Outcomes Goal: Foley discontinued Outcome: Adequate for Discharge Patient to be discharged home with foley catheter.  Problem: Discharge Progression Outcomes Goal: Staples/sutures removed Outcome: Adequate for Discharge Patient to follow up with urologist today. Goal: Steri-Strips applied Outcome: Adequate for Discharge Patient to follow up with urologist today.

## 2014-08-27 NOTE — Progress Notes (Signed)
Patient to be discharged to home today. Discharge instructions reviewed with patient. Wheelchair delivered to patient's bedside this morning. Verified with Weston Settle, PA that patient's antibiotics will be administered at HD. Patient to go to his urologist's office after discharge today.   Leanna Battles, RN.

## 2014-08-27 NOTE — Discharge Summary (Addendum)
Physician Discharge Summary  [name] MRN: [id number] DOB/AGE: 45-18-1972 45 y.o.  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 follow up with PCP in 5-7 days  Follow up with urology  Patient to DC home with Foley      Medication List    STOP taking these medications       cloNIDine 0.1 MG tablet  Commonly known as:  CATAPRES     furosemide 80 MG tablet  Commonly known as:  LASIX     isosorbide mononitrate 30 MG 24 hr tablet  Commonly known as:  IMDUR      TAKE these medications       acetaminophen 500 MG tablet  Commonly known as:  TYLENOL  Take 1,000 mg by mouth every 6 (six) hours as needed (pain).     amitriptyline 25 MG tablet  Commonly known as:  ELAVIL  Take 1 tablet (25 mg total) by mouth at bedtime.     aspirin 325 MG tablet  Take 325 mg by mouth daily with breakfast.     carvedilol 25 MG tablet  Commonly known as:  COREG  Take 25 mg by mouth 2 (two) times daily with a meal.     cefTAZidime 2 G injection  Commonly known as:  FORTAZ  Inject 2 g into the muscle Every Tuesday,Thursday,and Saturday with dialysis.     cinacalcet 60 MG tablet  Commonly known as:  SENSIPAR  Take 60 mg by mouth at bedtime. With biggest meal     citalopram 20 MG tablet  Commonly known as:  CELEXA  Take 20 mg by mouth every morning.     feeding supplement (NEPRO CARB STEADY) Liqd  Take 237 mLs by mouth as needed (missed meal during dialysis.).     Fish Oil 1000 MG Caps  Take 1,000 mg by mouth daily.     HYDROmorphone 2 MG tablet  Commonly known as:  DILAUDID  Take 2 mg by mouth every 4 (four) hours as needed for severe pain (pain).      insulin lispro 100 UNIT/ML injection  Commonly known as:  HUMALOG  Inject 12 Units into the skin 3 (three) times daily with meals. For every 50 over 200 add 1 unit.     LANTUS 100 UNIT/ML injection  Generic drug:  insulin glargine  Inject 36 units into the skin daily at bedtime     levothyroxine 75 MCG tablet  Commonly known as:  SYNTHROID, LEVOTHROID  Take 1 tablet (75 mcg total) by mouth daily before breakfast.     omeprazole 40 MG capsule  Commonly known as:  PRILOSEC  Take 40 mg by mouth daily.     pregabalin 50 MG capsule  Commonly known as:  LYRICA  Take 1-2 capsules (50-100 mg total) by mouth 2 (two) times daily.     rosuvastatin 20 MG tablet  Commonly known as:  CRESTOR  Take 1 tablet (20 mg total) by mouth at bedtime.     saccharomyces boulardii 250 MG capsule  Commonly known as:  FLORASTOR  Take 1 capsule (250 mg total) by mouth 2 (two) times daily.     sevelamer carbonate 800 MG tablet  Commonly known as:  RENVELA  Take 800 mg by mouth 3 (three) times daily with meals.     vancomycin 1 GM/200ML Soln  Commonly known as:  VANCOCIN  Inject 200 mLs (1,000 mg total) into the vein Every Tuesday,Thursday,and Saturday with dialysis.     vancomycin in sodium chloride 0.9 % 250 mL  With hemodialysis          Discharge Condition: Stable  Disposition: 01-Home or Self Care  Consults  Nephrology  Urology       Significant Diagnostic Studies: Ct Abdomen Pelvis Wo Contrast  08/22/2014   CLINICAL DATA:  43 year old male with diabetes and end-stage renal disease. Penile prosthesis implanted 07/24/2014. Increasing pain and swelling  EXAM: CT ABDOMEN AND PELVIS WITHOUT CONTRAST  TECHNIQUE: Multidetector CT imaging of the abdomen and pelvis was performed following the standard protocol without IV contrast.  COMPARISON:  None.  FINDINGS: Lower chest:  Surgical changes of prior median sternotomy. Otherwise unremarkable appearance of the subcutaneous tissues of the  lower chest wall.  Heart size within normal limits. Retained epicardial pacing lead courses from the pericardium to the midline soft tissues.  No pericardial fluid/thickening.  No confluent airspace disease pleural fluid or pneumothorax identified. There are several sub cm nodules of the bilateral lungs, including the right middle lobe on image 6 and the left lower lobe on image 4.  Abdomen/pelvis:  Diffusely decreased attenuation of liver parenchyma, with Hounsfield units measuring 47. Unremarkable appearance of the spleen.  Atrophy parenchyma of the pancreas, with no associated inflammatory changes or fluid.  No pericholecystic fluid or inflammatory changes. No radiopaque gallstones. No intrahepatic or extrahepatic biliary duct dilatation.  Unremarkable appearance of the bilateral adrenal glands.  Native kidneys are atrophied with calcifications of the vasculature the bilateral renal hila. No hydronephrosis. Bilateral hypodense foci within the kidney parenchyma, too small to completely characterize by CT.  Hypodense fluid within the abdomen, including bilateral subdiaphragmatic space, perihepatic space, perisplenic space, and extending within the peritoneum in the interloop space to the pelvis. No abnormally distended small bowel or colon.  Penile prosthesis in situ. The reservoir is present in the right inguinal region.  There is extensive gas throughout the tissues of the corpora cavernosum bilaterally, as well as along the course of the tube being within the scrotum. Scrotal edema/ fluid present.  Scattered calcifications of the abdominal aorta. More extensive calcifications of the mesenteric vessels and of the small vessels of the pelvis, compatible with the history of longstanding diabetes. Calcifications of the vasculature of the upper lower extremities.  IMPRESSION: Extensive gas along the course of penile implant within the corpora cavernosa bilaterally, concerning for gas-forming organism in the setting  of infection.  Fluid within the peritoneal cavity, may reflect inflammation, or alternatively edema/ anasarca with positive fluid balance.  Extensive small vessel calcification, compatible with atherosclerosis in the setting of long-standing diabetes.  These results were called by telephone at the time of interpretation on 08/22/2014 at 9:05 pm to the nurse caring for the patient, Mr. Percival Spanish, who verbally acknowledged these results.  Signed,  Yvone Neu. Loreta Ave, DO  [facility]  [facility]   Electronically Signed   By: Gilmer Mor D.O.   On: 08/22/2014 21:08   Dg Abd 1 View  08/23/2014   CLINICAL DATA:  Nausea and vomiting.  Initial encounter.  EXAM: ABDOMEN - 1 VIEW  COMPARISON:  None.  FINDINGS: There is no evidence of bowel obstruction. No concern intra-abdominal mass effect or calcification. A  surgical drain overlaps the right inguinal region. Diffuse arterial calcification. No acute osseous findings.  IMPRESSION: No evidence for bowel obstruction or ileus.   Electronically Signed   By: Tiburcio Pea M.D.   On: 08/23/2014 23:14      Microbiology: Recent Results (from the past 240 hour(s))  WOUND CULTURE     Status: None   Collection Time    08/22/14 11:34 PM      Result Value Ref Range Status   Specimen Description SCROTUM SWAB OF SCROTUM   Final   Special Requests NONE   Final   Gram Stain     Final   Value: ABUNDANT WBC PRESENT, PREDOMINANTLY PMN     NO SQUAMOUS EPITHELIAL CELLS SEEN     FEW GRAM POSITIVE COCCI     IN CLUSTERS     Performed at [facility]   Culture     Final   Value: MULTIPLE ORGANISMS PRESENT, NONE PREDOMINANT     NO NEISSERIA GONORRHOEAE ISOLATED     Note: NO STAPHYLOCOCCUS AUREUS ISOLATED NO GROUP A STREP (S.PYOGENES) ISOLATED     Performed at [facility]   Report Status 08/25/2014 FINAL   Final  ANAEROBIC CULTURE     Status: None   Collection Time    08/22/14 11:34 PM      Result Value  Ref Range Status   Specimen Description SCROTUM SWAB OF SCROTUM   Final   Special Requests NONE   Final   Gram Stain     Final   Value: ABUNDANT WBC PRESENT, PREDOMINANTLY PMN     NO SQUAMOUS EPITHELIAL CELLS SEEN     FEW GRAM POSITIVE COCCI     IN CLUSTERS     Performed at [facility]   Culture     Final   Value: NO ANAEROBES ISOLATED; CULTURE IN PROGRESS FOR 5 DAYS     Performed at [facility]   Report Status PENDING   Incomplete  MRSA PCR SCREENING     Status: None   Collection Time    08/23/14  4:16 AM      Result Value Ref Range Status   MRSA by PCR NEGATIVE  NEGATIVE Final   Comment:            The GeneXpert MRSA Assay (FDA     approved for NASAL specimens     only), is one component of a     comprehensive MRSA colonization     surveillance program. It is not     intended to diagnose MRSA     infection nor to guide or     monitor treatment for     MRSA infections.  CLOSTRIDIUM DIFFICILE BY PCR     Status: None   Collection Time    08/25/14  3:57 PM      Result Value Ref Range Status   C difficile by pcr NEGATIVE  NEGATIVE Final     Labs: Results for orders placed during the hospital encounter of 08/22/14 (from the past 48 hour(s))  GLUCOSE, CAPILLARY     Status: Abnormal   Collection Time    08/25/14 12:30 PM      Result Value Ref Range   Glucose-Capillary 165 (*) 70 - 99 mg/dL  T4, FREE     Status: None   Collection Time    08/25/14  2:30 PM      Result Value Ref Range   Free T4 1.17  0.80 -  1.80 ng/dL   Comment: Performed at [facility] DIFFICILE BY PCR     Status: None   Collection Time    08/25/14  3:57 PM      Result Value Ref Range   C difficile by pcr NEGATIVE  NEGATIVE  GLUCOSE, CAPILLARY     Status: Abnormal   Collection Time    08/25/14  5:11 PM      Result Value Ref Range   Glucose-Capillary 215 (*) 70 - 99 mg/dL  GLUCOSE, CAPILLARY     Status: Abnormal   Collection Time    08/25/14  9:57 PM       Result Value Ref Range   Glucose-Capillary 152 (*) 70 - 99 mg/dL  GLUCOSE, CAPILLARY     Status: Abnormal   Collection Time    08/26/14  7:32 AM      Result Value Ref Range   Glucose-Capillary 68 (*) 70 - 99 mg/dL  GLUCOSE, CAPILLARY     Status: None   Collection Time    08/26/14  8:36 AM      Result Value Ref Range   Glucose-Capillary 97  70 - 99 mg/dL  GLUCOSE, CAPILLARY     Status: None   Collection Time    08/26/14 11:54 AM      Result Value Ref Range   Glucose-Capillary 88  70 - 99 mg/dL  GLUCOSE, CAPILLARY     Status: None   Collection Time    08/26/14  4:56 PM      Result Value Ref Range   Glucose-Capillary 88  70 - 99 mg/dL  GLUCOSE, CAPILLARY     Status: Abnormal   Collection Time    08/26/14  9:39 PM      Result Value Ref Range   Glucose-Capillary 107 (*) 70 - 99 mg/dL  GLUCOSE, CAPILLARY     Status: None   Collection Time    08/27/14  7:38 AM      Result Value Ref Range   Glucose-Capillary 83  70 - 99 mg/dL     HPI 43 year old male with end-stage renal disease, on hemodialysis and type 1 diabetes who is status post placement of a three-piece inflatable Coloplast penile prosthesis on 07/24/2014 by Dr. Jethro Bolus. The patient over the past week or 2 has had worsening pain, with some scrotal drainage. He presented to the office today for evaluation and was felt to have a possible infection. He was sent to the emergency room for possible admission as well as CT scan. CT revealed ascites, as well as gas along his penile prosthesis. There is worried for gas forming bacteria. The patient has not had any fevers but has had shakes and chills. He has had an eschar on the end of his penis which he has, at times, had to remove to help him urinate, as it covers his meatus. He still makes urine despite his dialysis.  In the emergency room, the patient has significant penile edema and tenderness. There is no crepitus. The skin looks viable. There is minimal scrotal wound  discharge.  Urology reviewed his CT scan, and there was concern about  gas forming bacteria along his prosthesis. Patient underwent penile prosthesis explant emergently. CBG was elevated to greater than 600 upon admission    HOSPITAL COURSE:  Status post explant of penile prosthesis secondary to infection  Wound culture shows gram-positive cocci, anaerobes  Currently on Zosyn, vancomycin , stopped clindamycin ,   Can continue  with vancomycin and fortaz with hemodialysis, Tuesday Thursday Saturday  Duration will be based on culture results , currently scheduled for 2 weeks post discharge , until 11/2 Pt to DC home with foley and followup with Dr. Patsi Sears today   Diarrhea  Negative for C. difficile  Enteric precautions  Started on florastor  There was no need to place fl;exiseal,  Patient discharge has been canceled  Discontinue PPI  May give Imodium if diarrhea worsens    Diabetes  Started on DKA protocol  Restarted back on Lantus home dose and lispro  Accu-Cheks at least 4 times a day   End-stage renal disease on hemodialysis Tuesday Thursday Saturday, next hemodialysis 10/17   Hypertension initially blood pressure soft  Held Lasix, clonidine, and imdur Continue coreg  Can restart rest of the meds by PCP or renal if stable   Nausea? Side effect of his anesthesia vs ileus, KUB negative, resolved   Discharge Exam:    Blood pressure 147/74, pulse 89, temperature 98.4 F (36.9 C), temperature source Oral, resp. rate 18, height 6\' 3"  (1.905 m), weight 82.6 kg (182 lb 1.6 oz), SpO2 100.00%.  General: alert & oriented x 3 In NAD  Cardiovascular: RRR, nl S1 s2  Respiratory: Decreased breath sounds at the bases, scattered rhonchi, no crackles  Extremities: Trace pretibial edema bilaterally  Access: AVG @ LUA with + bruit           Discharge Instructions   Diet - low sodium heart healthy    Complete by:  As directed      Increase activity slowly    Complete by:  As  directed            Follow-up Information   Follow up with , MD. Schedule an appointment as soon as possible for a visit in 1 week.   Specialty:  Family Medicine   Contact information:   [address] [place] [phone]       Follow up with [phone], MD In 2 days.   Specialty:  Urology   Contact information:   [address] [place] [phone]       Follow up with [facility]. (home health RN, PT, OT)    Contact information:   [address] [place] [phone]       Follow up with [facility]. (rolling walker with seat, wheelchair manual with cushion)    Contact information:   [address] [place] [phone]       Signed: [phone] 08/27/2014, 10:35 AM

## 2014-08-27 NOTE — Progress Notes (Signed)
ANTIBIOTIC CONSULT NOTE - FOLLOW UP  Pharmacy Consult for Vancomycin and Ceftazidime Indication: scrotal/groin infection  Allergies  Allergen Reactions  . Amoxicillin-Pot Clavulanate Nausea And Vomiting  . Hydromorphone Hcl     "body started down"  08/22/14 - pt states no longer has problems with this  . Rifampin Nausea And Vomiting    Patient Measurements: Height:  (190.5 cm) Weight: 182 lb 1.6 oz (82.6 kg) IBW/kg (Calculated) : 84.5   Vital Signs: Temp: 98.2 F (36.8 C) (10/19 1056) Temp Source: Oral (10/19 1056) BP: 144/78 mmHg (10/19 1056) Pulse Rate: 81 (10/19 1056) Intake/Output from previous day: 10/18 0701 - 10/19 0700 In: 1530 [P.O.:1380; IV Piggyback:150] Out: 300 [Urine:300] Intake/Output from this shift: Total I/O In: 240 [P.O.:240] Out: -   Labs:  Recent Labs  08/25/14 0730  WBC 8.8  HGB 8.7*  PLT 151  CREATININE 4.23*   Estimated Creatinine Clearance: 25.8 ml/min (by C-G formula based on Cr of 4.23). No results found for this basename: VANCOTROUGH, VANCOPEAK, VANCORANDOM, GENTTROUGH, GENTPEAK, GENTRANDOM, TOBRATROUGH, TOBRAPEAK, TOBRARND, AMIKACINPEAK, AMIKACINTROU, AMIKACIN,  in the last 72 hours   Microbiology: Recent Results (from the past 720 hour(s))  WOUND CULTURE     Status: None   Collection Time    08/22/14 11:34 PM      Result Value Ref Range Status   Specimen Description SCROTUM SWAB OF SCROTUM   Final   Special Requests NONE   Final   Gram Stain     Final   Value: ABUNDANT WBC PRESENT, PREDOMINANTLY PMN     NO SQUAMOUS EPITHELIAL CELLS SEEN     FEW GRAM POSITIVE COCCI     IN CLUSTERS     Performed at [facility]   Culture     Final   Value: MULTIPLE ORGANISMS PRESENT, NONE PREDOMINANT     NO NEISSERIA GONORRHOEAE ISOLATED     Note: NO STAPHYLOCOCCUS AUREUS ISOLATED NO GROUP A STREP (S.PYOGENES) ISOLATED     Performed at [facility]   Report Status 08/25/2014 FINAL   Final  ANAEROBIC CULTURE      Status: None   Collection Time    08/22/14 11:34 PM      Result Value Ref Range Status   Specimen Description SCROTUM SWAB OF SCROTUM   Final   Special Requests NONE   Final   Gram Stain     Final   Value: ABUNDANT WBC PRESENT, PREDOMINANTLY PMN     NO SQUAMOUS EPITHELIAL CELLS SEEN     FEW GRAM POSITIVE COCCI     IN CLUSTERS     Performed at [facility]   Culture     Final   Value: NO ANAEROBES ISOLATED; CULTURE IN PROGRESS FOR 5 DAYS     Performed at [facility]   Report Status PENDING   Incomplete  MRSA PCR SCREENING     Status: None   Collection Time    08/23/14  4:16 AM      Result Value Ref Range Status   MRSA by PCR NEGATIVE  NEGATIVE Final   Comment:            The GeneXpert MRSA Assay (FDA     approved for NASAL specimens     only), is one component of a     comprehensive MRSA colonization     surveillance program. It is not     intended to diagnose MRSA     infection nor to guide or  monitor treatment for     MRSA infections.  CLOSTRIDIUM DIFFICILE BY PCR     Status: None   Collection Time    08/25/14  3:57 PM      Result Value Ref Range Status   C difficile by pcr NEGATIVE  NEGATIVE Final    Anti-infectives   Start     Dose/Rate Route Frequency Ordered Stop   08/28/14 1200  cefTAZidime (FORTAZ) 2 g in dextrose 5 % 50 mL IVPB     2 g 100 mL/hr over 30 Minutes Intravenous Every T-Th-Sa (Hemodialysis) 08/27/14 1117     08/27/14 1200  cefTAZidime (FORTAZ) 2 g in dextrose 5 % 50 mL IVPB     2 g 100 mL/hr over 30 Minutes Intravenous  Once 08/27/14 1117     08/27/14 0000  vancomycin (VANCOCIN) 1 GM/200ML SOLN  Status:  Discontinued     1,000 mg 200 mL/hr over 60 Minutes Intravenous Every T-Th-Sa (Hemodialysis) 08/27/14 0904 08/27/14    08/27/14 0000  cefTAZidime (FORTAZ) 2 G injection  Status:  Discontinued     2 g Intramuscular Every T-Th-Sa (Hemodialysis) 08/27/14 1043 08/27/14    08/27/14 0000  cefTAZidime (FORTAZ) 2 G injection     2 g  Intramuscular Every T-Th-Sa (Hemodialysis) 08/27/14 1052 09/10/14 2359   08/27/14 0000  vancomycin (VANCOCIN) 1 GM/200ML SOLN     1,000 mg 200 mL/hr over 60 Minutes Intravenous Every T-Th-Sa (Hemodialysis) 08/27/14 1052 09/10/14 2359   08/25/14 1200  vancomycin (VANCOCIN) IVPB 1000 mg/200 mL premix     1,000 mg 200 mL/hr over 60 Minutes Intravenous Every T-Th-Sa (Hemodialysis) 08/24/14 1144     08/24/14 0000  amoxicillin-clavulanate (AUGMENTIN) 500-125 MG per tablet  Status:  Discontinued     1 tablet Oral 3 times daily 08/24/14 1118 08/24/14    08/24/14 0000  doxycycline (VIBRAMYCIN) 50 MG capsule  Status:  Discontinued     100 mg Oral 2 times daily 08/24/14 1118 08/27/14    08/24/14 0000  amoxicillin-clavulanate (AUGMENTIN) 500-125 MG per tablet  Status:  Discontinued     1 tablet Oral  Every morning - 10a 08/24/14 1122 08/27/14    08/24/14 0000  vancomycin in sodium chloride 0.9 % 250 mL  Status:  Discontinued     250 mL/hr over 60 Minutes   08/24/14 1132 08/24/14    08/24/14 0000  vancomycin in sodium chloride 0.9 % 250 mL    Comments:  Tuesday Thursday Saturday   250 mL/hr over 60 Minutes   08/24/14 1133 09/07/14 2359   08/23/14 1830  vancomycin (VANCOCIN) IVPB 1000 mg/200 mL premix     1,000 mg 200 mL/hr over 60 Minutes Intravenous  Once 08/23/14 1817 08/23/14 2100   08/23/14 0800  piperacillin-tazobactam (ZOSYN) IVPB 3.375 g  Status:  Discontinued     3.375 g 12.5 mL/hr over 240 Minutes Intravenous Every 8 hours 08/23/14 0235 08/23/14 0237   08/23/14 0400  piperacillin-tazobactam (ZOSYN) IVPB 2.25 g  Status:  Discontinued     2.25 g 100 mL/hr over 30 Minutes Intravenous Every 8 hours 08/23/14 0238 08/27/14 1117   08/23/14 0245  piperacillin-tazobactam (ZOSYN) IVPB 3.375 g  Status:  Discontinued     3.375 g 100 mL/hr over 30 Minutes Intravenous  Once 08/23/14 0235 08/23/14 0237   08/22/14 2339  polymyxin B 500,000 Units, bacitracin 50,000 Units in sodium chloride irrigation 0.9 %  500 mL irrigation  Status:  Discontinued       As needed 08/22/14 2340  08/23/14 0024   08/22/14 2200  clindamycin (CLEOCIN) IVPB 600 mg  Status:  Discontinued     600 mg 100 mL/hr over 30 Minutes Intravenous 3 times per day 08/22/14 2144 08/23/14 1804   08/22/14 1800  vancomycin (VANCOCIN) 1,500 mg in sodium chloride 0.9 % 500 mL IVPB     1,500 mg 250 mL/hr over 120 Minutes Intravenous NOW 08/22/14 1739 08/22/14 2143   08/22/14 1730  vancomycin (VANCOCIN) IVPB 1000 mg/200 mL premix  Status:  Discontinued     1,000 mg 200 mL/hr over 60 Minutes Intravenous  Once 08/22/14 1720 08/22/14 1736      Assessment: 43yo male ESRD pt with groin/scrotal infection and CT concerning for gas-forming bacteria, now s/p emergent removal of penile implant 10/13. MRSA PCR was negative, wound cx currently has GPC in clusters growing, sensitivities pending- reincubated for better growth, no anaerobes have been isolated.  Patient continues on vancomycin and Zosyn and is to change to Ceftazidime for discharge.  Patient had a dialysis session 10/17 - 3.5hrs, BFR 400.  Goal of Therapy:  Pre-HD Vancomycin level 15-25 mcg/ml resolution of infection  Plan:  Vancomycin 1000mg  IV qHD TTS Ceftazidime 2gm IV x 1 today, then 2gm IV qHD TTS Follow c/s and clinical progression; vancomycin level as appropriate  , Pharm.D., BCPS, AAHIVP Clinical Pharmacist Phone: [phone] or [phone] 08/27/2014, 11:18 AM

## 2014-09-07 ENCOUNTER — Inpatient Hospital Stay: Payer: Self-pay | Admitting: Internal Medicine

## 2014-09-07 DIAGNOSIS — E1101 Type 2 diabetes mellitus with hyperosmolarity with coma: Secondary | ICD-10-CM | POA: Insufficient documentation

## 2014-09-07 LAB — COMPREHENSIVE METABOLIC PANEL
ALBUMIN: 2.2 g/dL — AB (ref 3.4–5.0)
ALK PHOS: 379 U/L — AB
ALT: 33 U/L
ANION GAP: 15 (ref 7–16)
BUN: 33 mg/dL — AB (ref 7–18)
Bilirubin,Total: 0.7 mg/dL (ref 0.2–1.0)
CREATININE: 3.85 mg/dL — AB (ref 0.60–1.30)
Calcium, Total: 7.9 mg/dL — ABNORMAL LOW (ref 8.5–10.1)
Chloride: 89 mmol/L — ABNORMAL LOW (ref 98–107)
Co2: 22 mmol/L (ref 21–32)
EGFR (African American): 22 — ABNORMAL LOW
GFR CALC NON AF AMER: 18 — AB
Glucose: 793 mg/dL (ref 65–99)
Osmolality: 299 (ref 275–301)
Potassium: 4.6 mmol/L (ref 3.5–5.1)
SGOT(AST): 21 U/L (ref 15–37)
Sodium: 126 mmol/L — ABNORMAL LOW (ref 136–145)
TOTAL PROTEIN: 6.7 g/dL (ref 6.4–8.2)

## 2014-09-07 LAB — URINALYSIS, COMPLETE
Bacteria: NONE SEEN
Bilirubin,UR: NEGATIVE
Glucose,UR: >=500 mg/dL (ref 0–75)
NITRITE: NEGATIVE
Ph: 7 (ref 4.5–8.0)
Protein: 100
Specific Gravity: 1.014 (ref 1.003–1.030)
Squamous Epithelial: NONE SEEN

## 2014-09-07 LAB — CBC
HCT: 33 % — ABNORMAL LOW (ref 40.0–52.0)
HGB: 9.7 g/dL — ABNORMAL LOW (ref 13.0–18.0)
MCH: 28.4 pg (ref 26.0–34.0)
MCHC: 29.3 g/dL — ABNORMAL LOW (ref 32.0–36.0)
MCV: 97 fL (ref 80–100)
Platelet: 92 10*3/uL — ABNORMAL LOW (ref 150–440)
RBC: 3.4 10*6/uL — AB (ref 4.40–5.90)
RDW: 18.9 % — ABNORMAL HIGH (ref 11.5–14.5)
WBC: 6.6 10*3/uL (ref 3.8–10.6)

## 2014-09-08 LAB — BASIC METABOLIC PANEL
ANION GAP: 12 (ref 7–16)
BUN: 36 mg/dL — ABNORMAL HIGH (ref 7–18)
CALCIUM: 8 mg/dL — AB (ref 8.5–10.1)
CHLORIDE: 98 mmol/L (ref 98–107)
Co2: 24 mmol/L (ref 21–32)
Creatinine: 4.03 mg/dL — ABNORMAL HIGH (ref 0.60–1.30)
EGFR (Non-African Amer.): 17 — ABNORMAL LOW
GFR CALC AF AMER: 21 — AB
Glucose: 76 mg/dL (ref 65–99)
Osmolality: 275 (ref 275–301)
POTASSIUM: 3.6 mmol/L (ref 3.5–5.1)
Sodium: 134 mmol/L — ABNORMAL LOW (ref 136–145)

## 2014-09-08 LAB — CBC WITH DIFFERENTIAL/PLATELET
BASOS ABS: 0.2 10*3/uL — AB (ref 0.0–0.1)
BASOS PCT: 2.4 %
EOS ABS: 0.3 10*3/uL (ref 0.0–0.7)
Eosinophil %: 4.2 %
HCT: 28.4 % — AB (ref 40.0–52.0)
HGB: 9 g/dL — ABNORMAL LOW (ref 13.0–18.0)
Lymphocyte #: 0.8 10*3/uL — ABNORMAL LOW (ref 1.0–3.6)
Lymphocyte %: 9.9 %
MCH: 28.8 pg (ref 26.0–34.0)
MCHC: 31.8 g/dL — ABNORMAL LOW (ref 32.0–36.0)
MCV: 91 fL (ref 80–100)
MONO ABS: 0.7 x10 3/mm (ref 0.2–1.0)
MONOS PCT: 8.8 %
NEUTROS ABS: 5.9 10*3/uL (ref 1.4–6.5)
Neutrophil %: 74.7 %
Platelet: 113 10*3/uL — ABNORMAL LOW (ref 150–440)
RBC: 3.13 10*6/uL — ABNORMAL LOW (ref 4.40–5.90)
RDW: 18.1 % — AB (ref 11.5–14.5)
WBC: 7.9 10*3/uL (ref 3.8–10.6)

## 2014-09-08 LAB — T4, FREE: FREE THYROXINE: 0.9 ng/dL (ref 0.76–1.46)

## 2014-09-08 LAB — HEMOGLOBIN A1C: Hemoglobin A1C: 11.3 % — ABNORMAL HIGH (ref 4.2–6.3)

## 2014-09-08 LAB — TSH: Thyroid Stimulating Horm: 6.98 u[IU]/mL — ABNORMAL HIGH

## 2014-09-08 LAB — PHOSPHORUS: Phosphorus: 4 mg/dL (ref 2.5–4.9)

## 2014-09-09 LAB — URINE CULTURE

## 2014-09-10 LAB — PHOSPHORUS: Phosphorus: 3.6 mg/dL (ref 2.5–4.9)

## 2014-09-11 LAB — CBC WITH DIFFERENTIAL/PLATELET
Basophil #: 0.1 10*3/uL (ref 0.0–0.1)
Basophil #: 0.1 10*3/uL (ref 0.0–0.1)
Basophil %: 1.2 %
Basophil %: 1.2 %
EOS PCT: 4.9 %
Eosinophil #: 0.3 10*3/uL (ref 0.0–0.7)
Eosinophil #: 0.3 10*3/uL (ref 0.0–0.7)
Eosinophil %: 4.6 %
HCT: 29.9 % — AB (ref 40.0–52.0)
HCT: 26.9 % — AB (ref 40.0–52.0)
HGB: 9.3 g/dL — ABNORMAL LOW (ref 13.0–18.0)
HGB: 8.3 g/dL — AB (ref 13.0–18.0)
LYMPHS ABS: 0.7 10*3/uL — AB (ref 1.0–3.6)
LYMPHS PCT: 8.5 %
Lymphocyte #: 0.5 10*3/uL — ABNORMAL LOW (ref 1.0–3.6)
Lymphocyte %: 11.8 %
MCH: 29.1 pg (ref 26.0–34.0)
MCH: 28.6 pg (ref 26.0–34.0)
MCHC: 31 g/dL — AB (ref 32.0–36.0)
MCHC: 30.8 g/dL — ABNORMAL LOW (ref 32.0–36.0)
MCV: 94 fL (ref 80–100)
MCV: 93 fL (ref 80–100)
MONO ABS: 0.5 x10 3/mm (ref 0.2–1.0)
MONOS PCT: 11.2 %
Monocyte #: 0.7 x10 3/mm (ref 0.2–1.0)
Monocyte %: 9.2 %
NEUTROS ABS: 4.4 10*3/uL (ref 1.4–6.5)
Neutrophil #: 4.5 10*3/uL (ref 1.4–6.5)
Neutrophil %: 70.9 %
Neutrophil %: 76.5 %
PLATELETS: 101 10*3/uL — AB (ref 150–440)
Platelet: 106 10*3/uL — ABNORMAL LOW (ref 150–440)
RBC: 3.19 10*6/uL — ABNORMAL LOW (ref 4.40–5.90)
RBC: 2.89 10*6/uL — AB (ref 4.40–5.90)
RDW: 18 % — AB (ref 11.5–14.5)
RDW: 18.3 % — ABNORMAL HIGH (ref 11.5–14.5)
WBC: 6.2 10*3/uL (ref 3.8–10.6)
WBC: 5.9 10*3/uL (ref 3.8–10.6)

## 2014-09-11 LAB — BASIC METABOLIC PANEL
ANION GAP: 8 (ref 7–16)
BUN: 25 mg/dL — ABNORMAL HIGH (ref 7–18)
CALCIUM: 7.8 mg/dL — AB (ref 8.5–10.1)
CO2: 32 mmol/L (ref 21–32)
Chloride: 97 mmol/L — ABNORMAL LOW (ref 98–107)
Creatinine: 3.29 mg/dL — ABNORMAL HIGH (ref 0.60–1.30)
EGFR (Non-African Amer.): 22 — ABNORMAL LOW
GFR CALC AF AMER: 26 — AB
GLUCOSE: 44 mg/dL — AB (ref 65–99)
OSMOLALITY: 275 (ref 275–301)
Potassium: 4 mmol/L (ref 3.5–5.1)
Sodium: 137 mmol/L (ref 136–145)

## 2014-09-11 LAB — RENAL FUNCTION PANEL
ANION GAP: 8 (ref 7–16)
Albumin: 1.9 g/dL — ABNORMAL LOW (ref 3.4–5.0)
BUN: 28 mg/dL — ABNORMAL HIGH (ref 7–18)
CALCIUM: 7.4 mg/dL — AB (ref 8.5–10.1)
CREATININE: 3.53 mg/dL — AB (ref 0.60–1.30)
Chloride: 96 mmol/L — ABNORMAL LOW (ref 98–107)
Co2: 31 mmol/L (ref 21–32)
EGFR (Non-African Amer.): 20 — ABNORMAL LOW
GFR CALC AF AMER: 24 — AB
Glucose: 122 mg/dL — ABNORMAL HIGH (ref 65–99)
Osmolality: 277 (ref 275–301)
PHOSPHORUS: 3.4 mg/dL (ref 2.5–4.9)
Potassium: 4.4 mmol/L (ref 3.5–5.1)
Sodium: 135 mmol/L — ABNORMAL LOW (ref 136–145)

## 2014-09-12 LAB — RENAL FUNCTION PANEL
Albumin: 2 g/dL — ABNORMAL LOW (ref 3.4–5.0)
BUN: 30 mg/dL — ABNORMAL HIGH (ref 7–18)
Calcium, Total: 7.1 mg/dL — ABNORMAL LOW (ref 8.5–10.1)
Chloride: 89 mmol/L — ABNORMAL LOW (ref 98–107)
Co2: 28 mmol/L (ref 21–32)
Creatinine: 3.25 mg/dL — ABNORMAL HIGH (ref 0.60–1.30)
GLUCOSE: 414 mg/dL — AB (ref 65–99)
POTASSIUM: 4.7 mmol/L (ref 3.5–5.1)
Phosphorus: 2.4 mg/dL — ABNORMAL LOW (ref 2.5–4.9)
Sodium: 128 mmol/L — ABNORMAL LOW (ref 136–145)

## 2014-09-12 LAB — CULTURE, BLOOD (SINGLE)

## 2014-09-12 LAB — WOUND AEROBIC CULTURE

## 2014-09-13 DIAGNOSIS — I341 Nonrheumatic mitral (valve) prolapse: Secondary | ICD-10-CM

## 2014-09-13 DIAGNOSIS — I509 Heart failure, unspecified: Secondary | ICD-10-CM

## 2014-09-13 DIAGNOSIS — I2510 Atherosclerotic heart disease of native coronary artery without angina pectoris: Secondary | ICD-10-CM

## 2014-09-13 DIAGNOSIS — E785 Hyperlipidemia, unspecified: Secondary | ICD-10-CM

## 2014-09-13 DIAGNOSIS — N186 End stage renal disease: Secondary | ICD-10-CM

## 2014-09-13 DIAGNOSIS — I10 Essential (primary) hypertension: Secondary | ICD-10-CM

## 2014-09-13 LAB — CBC WITH DIFFERENTIAL/PLATELET
BASOS PCT: 1.2 %
Basophil #: 0.1 10*3/uL (ref 0.0–0.1)
EOS ABS: 0.2 10*3/uL (ref 0.0–0.7)
Eosinophil %: 2.6 %
HCT: 29.2 % — ABNORMAL LOW (ref 40.0–52.0)
HGB: 9 g/dL — ABNORMAL LOW (ref 13.0–18.0)
LYMPHS PCT: 9.5 %
Lymphocyte #: 0.6 10*3/uL — ABNORMAL LOW (ref 1.0–3.6)
MCH: 28.7 pg (ref 26.0–34.0)
MCHC: 30.8 g/dL — ABNORMAL LOW (ref 32.0–36.0)
MCV: 93 fL (ref 80–100)
MONO ABS: 0.5 x10 3/mm (ref 0.2–1.0)
MONOS PCT: 8.2 %
Neutrophil #: 4.7 10*3/uL (ref 1.4–6.5)
Neutrophil %: 78.5 %
PLATELETS: 100 10*3/uL — AB (ref 150–440)
RBC: 3.13 10*6/uL — AB (ref 4.40–5.90)
RDW: 17.8 % — ABNORMAL HIGH (ref 11.5–14.5)
WBC: 6 10*3/uL (ref 3.8–10.6)

## 2014-09-14 LAB — RENAL FUNCTION PANEL
ALBUMIN: 2.1 g/dL — AB (ref 3.4–5.0)
ANION GAP: 8 (ref 7–16)
BUN: 22 mg/dL — ABNORMAL HIGH (ref 7–18)
CHLORIDE: 94 mmol/L — AB (ref 98–107)
CO2: 31 mmol/L (ref 21–32)
Calcium, Total: 7.1 mg/dL — ABNORMAL LOW (ref 8.5–10.1)
Creatinine: 2.78 mg/dL — ABNORMAL HIGH (ref 0.60–1.30)
EGFR (African American): 32 — ABNORMAL LOW
EGFR (Non-African Amer.): 26 — ABNORMAL LOW
Glucose: 73 mg/dL (ref 65–99)
Osmolality: 268 (ref 275–301)
Phosphorus: 2.2 mg/dL — ABNORMAL LOW (ref 2.5–4.9)
Potassium: 4.6 mmol/L (ref 3.5–5.1)
Sodium: 133 mmol/L — ABNORMAL LOW (ref 136–145)

## 2014-09-14 LAB — CBC WITH DIFFERENTIAL/PLATELET
Basophil #: 0.1 10*3/uL (ref 0.0–0.1)
Basophil %: 1.4 %
Eosinophil #: 0.2 10*3/uL (ref 0.0–0.7)
Eosinophil %: 3.5 %
HCT: 28.3 % — ABNORMAL LOW (ref 40.0–52.0)
HGB: 8.7 g/dL — AB (ref 13.0–18.0)
LYMPHS ABS: 0.6 10*3/uL — AB (ref 1.0–3.6)
Lymphocyte %: 12.9 %
MCH: 28.6 pg (ref 26.0–34.0)
MCHC: 30.8 g/dL — AB (ref 32.0–36.0)
MCV: 93 fL (ref 80–100)
MONO ABS: 0.5 x10 3/mm (ref 0.2–1.0)
Monocyte %: 10.2 %
Neutrophil #: 3.6 10*3/uL (ref 1.4–6.5)
Neutrophil %: 72 %
Platelet: 107 10*3/uL — ABNORMAL LOW (ref 150–440)
RBC: 3.04 10*6/uL — ABNORMAL LOW (ref 4.40–5.90)
RDW: 17.9 % — ABNORMAL HIGH (ref 11.5–14.5)
WBC: 4.9 10*3/uL (ref 3.8–10.6)

## 2014-09-15 LAB — CBC WITH DIFFERENTIAL/PLATELET
Basophil #: 0.1 10*3/uL (ref 0.0–0.1)
Basophil %: 1.8 %
EOS ABS: 0.2 10*3/uL (ref 0.0–0.7)
Eosinophil %: 3.4 %
HCT: 29.2 % — ABNORMAL LOW (ref 40.0–52.0)
HGB: 9.1 g/dL — ABNORMAL LOW (ref 13.0–18.0)
LYMPHS PCT: 15.7 %
Lymphocyte #: 0.7 10*3/uL — ABNORMAL LOW (ref 1.0–3.6)
MCH: 29.2 pg (ref 26.0–34.0)
MCHC: 31 g/dL — ABNORMAL LOW (ref 32.0–36.0)
MCV: 94 fL (ref 80–100)
Monocyte #: 0.6 x10 3/mm (ref 0.2–1.0)
Monocyte %: 12.4 %
NEUTROS ABS: 3.1 10*3/uL (ref 1.4–6.5)
Neutrophil %: 66.7 %
Platelet: 119 10*3/uL — ABNORMAL LOW (ref 150–440)
RBC: 3.1 10*6/uL — ABNORMAL LOW (ref 4.40–5.90)
RDW: 18.2 % — ABNORMAL HIGH (ref 11.5–14.5)
WBC: 4.7 10*3/uL (ref 3.8–10.6)

## 2014-09-15 LAB — BASIC METABOLIC PANEL
Anion Gap: 8 (ref 7–16)
BUN: 13 mg/dL (ref 7–18)
Calcium, Total: 7.3 mg/dL — ABNORMAL LOW (ref 8.5–10.1)
Chloride: 94 mmol/L — ABNORMAL LOW (ref 98–107)
Co2: 34 mmol/L — ABNORMAL HIGH (ref 21–32)
Creatinine: 2.39 mg/dL — ABNORMAL HIGH (ref 0.60–1.30)
EGFR (Non-African Amer.): 31 — ABNORMAL LOW
GFR CALC AF AMER: 38 — AB
Glucose: 129 mg/dL — ABNORMAL HIGH (ref 65–99)
OSMOLALITY: 274 (ref 275–301)
Potassium: 4.3 mmol/L (ref 3.5–5.1)
SODIUM: 136 mmol/L (ref 136–145)

## 2014-09-17 ENCOUNTER — Telehealth: Payer: Self-pay

## 2014-09-17 DIAGNOSIS — I509 Heart failure, unspecified: Secondary | ICD-10-CM

## 2014-09-17 DIAGNOSIS — N4822 Cellulitis of corpus cavernosum and penis: Secondary | ICD-10-CM

## 2014-09-17 DIAGNOSIS — E1040 Type 1 diabetes mellitus with diabetic neuropathy, unspecified: Secondary | ICD-10-CM

## 2014-09-17 DIAGNOSIS — T836XXD Infection and inflammatory reaction due to prosthetic device, implant and graft in genital tract, subsequent encounter: Secondary | ICD-10-CM

## 2014-09-17 LAB — CBC WITH DIFFERENTIAL/PLATELET
BASOS ABS: 0.1 10*3/uL (ref 0.0–0.1)
Basophil %: 1.3 %
Eosinophil #: 0.2 10*3/uL (ref 0.0–0.7)
Eosinophil %: 3.6 %
HCT: 29.9 % — ABNORMAL LOW (ref 40.0–52.0)
HGB: 9.2 g/dL — AB (ref 13.0–18.0)
LYMPHS PCT: 9.9 %
Lymphocyte #: 0.7 10*3/uL — ABNORMAL LOW (ref 1.0–3.6)
MCH: 29.2 pg (ref 26.0–34.0)
MCHC: 30.9 g/dL — ABNORMAL LOW (ref 32.0–36.0)
MCV: 94 fL (ref 80–100)
Monocyte #: 0.6 x10 3/mm (ref 0.2–1.0)
Monocyte %: 8.3 %
NEUTROS ABS: 5.3 10*3/uL (ref 1.4–6.5)
Neutrophil %: 76.9 %
Platelet: 136 10*3/uL — ABNORMAL LOW (ref 150–440)
RBC: 3.17 10*6/uL — ABNORMAL LOW (ref 4.40–5.90)
RDW: 18.1 % — AB (ref 11.5–14.5)
WBC: 6.8 10*3/uL (ref 3.8–10.6)

## 2014-09-17 LAB — RENAL FUNCTION PANEL
Albumin: 2.4 g/dL — ABNORMAL LOW (ref 3.4–5.0)
Anion Gap: 12 (ref 7–16)
BUN: 23 mg/dL — ABNORMAL HIGH (ref 7–18)
CO2: 26 mmol/L (ref 21–32)
Calcium, Total: 6.5 mg/dL — CL (ref 8.5–10.1)
Chloride: 90 mmol/L — ABNORMAL LOW (ref 98–107)
Creatinine: 3.64 mg/dL — ABNORMAL HIGH (ref 0.60–1.30)
EGFR (Non-African Amer.): 19 — ABNORMAL LOW
GFR CALC AF AMER: 23 — AB
GLUCOSE: 81 mg/dL (ref 65–99)
OSMOLALITY: 260 (ref 275–301)
Phosphorus: 3.3 mg/dL (ref 2.5–4.9)
Potassium: 5.4 mmol/L — ABNORMAL HIGH (ref 3.5–5.1)
Sodium: 128 mmol/L — ABNORMAL LOW (ref 136–145)

## 2014-09-17 NOTE — Telephone Encounter (Signed)
Left detailed message for Taylor [name] regarding discharge from [facility] on 09/17/14. Advised that Taylor [name] has an appt w/ Dr. Kirke Corin on 10/08/14 @ 2:30 at our new office location. Asked him to call back w/ any questions about his medications and/or discharge instructions

## 2014-09-19 LAB — BASIC METABOLIC PANEL
Anion Gap: 8 (ref 7–16)
BUN: 36 mg/dL — ABNORMAL HIGH (ref 7–18)
Calcium, Total: 6.6 mg/dL — CL (ref 8.5–10.1)
Chloride: 91 mmol/L — ABNORMAL LOW (ref 98–107)
Co2: 31 mmol/L (ref 21–32)
Creatinine: 4.03 mg/dL — ABNORMAL HIGH (ref 0.60–1.30)
EGFR (African American): 21 — ABNORMAL LOW
EGFR (Non-African Amer.): 17 — ABNORMAL LOW
GLUCOSE: 91 mg/dL (ref 65–99)
OSMOLALITY: 269 (ref 275–301)
Potassium: 5.4 mmol/L — ABNORMAL HIGH (ref 3.5–5.1)
Sodium: 130 mmol/L — ABNORMAL LOW (ref 136–145)

## 2014-09-19 LAB — CBC WITH DIFFERENTIAL/PLATELET
Basophil #: 0.1 10*3/uL (ref 0.0–0.1)
Basophil %: 1.1 %
EOS ABS: 0.2 10*3/uL (ref 0.0–0.7)
Eosinophil %: 3.1 %
HCT: 28.5 % — ABNORMAL LOW (ref 40.0–52.0)
HGB: 8.9 g/dL — AB (ref 13.0–18.0)
LYMPHS ABS: 0.7 10*3/uL — AB (ref 1.0–3.6)
LYMPHS PCT: 13 %
MCH: 29.1 pg (ref 26.0–34.0)
MCHC: 31.1 g/dL — AB (ref 32.0–36.0)
MCV: 94 fL (ref 80–100)
Monocyte #: 0.5 x10 3/mm (ref 0.2–1.0)
Monocyte %: 9 %
NEUTROS PCT: 73.8 %
Neutrophil #: 4.2 10*3/uL (ref 1.4–6.5)
Platelet: 116 10*3/uL — ABNORMAL LOW (ref 150–440)
RBC: 3.04 10*6/uL — ABNORMAL LOW (ref 4.40–5.90)
RDW: 18.5 % — ABNORMAL HIGH (ref 11.5–14.5)
WBC: 5.7 10*3/uL (ref 3.8–10.6)

## 2014-09-19 LAB — PHOSPHORUS: Phosphorus: 3.8 mg/dL (ref 2.5–4.9)

## 2014-09-20 LAB — PHOSPHORUS: Phosphorus: 3.1 mg/dL (ref 2.5–4.9)

## 2014-09-20 LAB — WOUND CULTURE

## 2014-09-27 ENCOUNTER — Encounter: Payer: Self-pay | Admitting: *Deleted

## 2014-09-27 ENCOUNTER — Other Ambulatory Visit: Payer: Self-pay | Admitting: Family Medicine

## 2014-09-28 ENCOUNTER — Ambulatory Visit: Payer: Medicare Other | Admitting: Internal Medicine

## 2014-09-29 ENCOUNTER — Other Ambulatory Visit: Payer: Self-pay | Admitting: Family Medicine

## 2014-10-01 NOTE — Telephone Encounter (Signed)
Spoke to [place] at [facility] who states that the pt is receiving Lyrica 75mg  from Dr . Normally pt has Rx sent to mail order; Lyrica 75 not pts current list. Thedore Mins advised to not refill med under Dr Morrie Sheldon, and to disregard any future request for this medication to our office. Per Dr [name], pt will need to receive from original prescriber. Lm on pts vm requesting a call back

## 2014-10-01 NOTE — Telephone Encounter (Signed)
Pt requesting medication refill. Last f/u appt 04/2014-CPE. pls advise

## 2014-10-01 NOTE — Telephone Encounter (Signed)
Rx called in to requested pharmacy 

## 2014-10-02 ENCOUNTER — Encounter: Payer: Self-pay | Admitting: *Deleted

## 2014-10-08 ENCOUNTER — Encounter: Payer: Medicare Other | Admitting: Cardiovascular Disease

## 2014-10-10 ENCOUNTER — Encounter: Payer: Self-pay | Admitting: Family Medicine

## 2014-10-10 ENCOUNTER — Ambulatory Visit (INDEPENDENT_AMBULATORY_CARE_PROVIDER_SITE_OTHER): Payer: Medicare Other | Admitting: Family Medicine

## 2014-10-10 VITALS — BP 146/92 | HR 76 | Temp 97.6°F | Wt 176.0 lb

## 2014-10-10 DIAGNOSIS — F411 Generalized anxiety disorder: Secondary | ICD-10-CM

## 2014-10-10 DIAGNOSIS — F410 Panic disorder [episodic paroxysmal anxiety] without agoraphobia: Secondary | ICD-10-CM

## 2014-10-10 DIAGNOSIS — I2510 Atherosclerotic heart disease of native coronary artery without angina pectoris: Secondary | ICD-10-CM

## 2014-10-10 DIAGNOSIS — E1100 Type 2 diabetes mellitus with hyperosmolarity without nonketotic hyperglycemic-hyperosmolar coma (NKHHC): Secondary | ICD-10-CM

## 2014-10-10 DIAGNOSIS — T814XXA Infection following a procedure, initial encounter: Secondary | ICD-10-CM

## 2014-10-10 DIAGNOSIS — E8779 Other fluid overload: Secondary | ICD-10-CM

## 2014-10-10 DIAGNOSIS — T8140XA Infection following a procedure, unspecified, initial encounter: Secondary | ICD-10-CM

## 2014-10-10 NOTE — Progress Notes (Signed)
Pre visit review using our clinic review tool, if applicable. No additional management support is needed unless otherwise documented below in the visit note. 

## 2014-10-11 DIAGNOSIS — E8770 Fluid overload, unspecified: Secondary | ICD-10-CM | POA: Insufficient documentation

## 2014-10-11 NOTE — Assessment & Plan Note (Signed)
Recurrent- blood sugars frequently very elevated when he has infections- often in 500-600s like they were during these admissions.  Feels blood sugars still difficult to control- chronic issue, but better.  Followed by endocrinology.

## 2014-10-11 NOTE — Assessment & Plan Note (Signed)
New- s/p abx/antifungals. Has follow up with urology today.

## 2014-10-11 NOTE — Assessment & Plan Note (Signed)
New- likely due to aggressive fluid hydration during first admission. Resolved- continue with routine HD.

## 2014-10-11 NOTE — Progress Notes (Deleted)
   Subjective:   Patient ID: Eustace Quail, male    DOB: Apr 25, 1971, 43 y.o.   MRN: [id number]  ATHANASIOS HELDMAN is a pleasant 43 y.o. year old male who presents to clinic today with Hospitalization Follow-up  on 10/10/2014   Complicated medical history including ESRD on HD, brittle diabetes, CAD s/p CABG, h/o multiple abscesses her for hospital follow up.  Notes reviewed- Was admitted to [facility] 10/14- 10/19 for explant of penile prosthesis secondary to infection. Wound cx grew gram pos cocci, aneroabes- initially received Zosyn, Vanc ,clindamycin.  Was d/cd home on Zenaida Niece and fortaz with HD.  No longer receiving abx. Following up with Dr. Marcello Fennel today.  Had diarrhea in hospital but this has resolved. He feels he is doing"ok."        Review of Systems     Objective:    BP 146/92 mmHg  Pulse 76  Temp(Src) 97.6 F (36.4 C) (Oral)  Wt 176 lb (79.833 kg)  SpO2 97%   Physical Exam        Assessment & Plan:   Anxiety state No Follow-up on file.

## 2014-10-11 NOTE — Progress Notes (Signed)
Subjective:   Patient ID: Taylor [name], male    DOB: 1971-08-26, 43 y.o.   MRN: [id number]  Taylor [name] is a pleasant 43 y.o. 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  Initially admitted to [facility] 10/14- 10/19 due to infected penile prothesis requiring explant of prosthesis on 10/14.   Wound cx grew anaerobes, gram pos cocci. Was treated with zosyn, vanc and clindamycin. D/c'd home on vanc and fortaz with HD. Was also started on Paxil for anxiety- he feels his symptoms have improved with this.  Also given rx for prn Ativan but has not had to use this often.  Unfortunately he was then admitted to [facility] on 10/30 due volume overload and wound dehiscence. Wound cx grew yeast- treated with topical antifungals.  He states today that he feels better but he "is worn out-tired of feeling bad."  Has appt with Dr. Patsi Sears later today.  Current Outpatient Prescriptions on File Prior to Visit  Medication Sig Dispense Refill  . acetaminophen (TYLENOL) 500 MG tablet Take 1,000 mg by mouth every 6 (six) hours as needed (pain).    [name] amitriptyline (ELAVIL) 25 MG tablet Take 1 tablet (25 mg total) by mouth at bedtime. 30 tablet 1  . aspirin 81 MG tablet Take 81 mg by mouth daily.    . carvedilol (COREG) 25 MG tablet Take 25 mg by mouth 2 (two) times daily with a meal.    . cinacalcet (SENSIPAR) 60 MG tablet Take 60 mg by mouth at bedtime. With biggest meal    . citalopram (CELEXA) 20 MG tablet TAKE 1 BY MOUTH DAILY 30 tablet 2  . cloNIDine (CATAPRES) 0.1 MG tablet Take 0.1 mg by mouth 2 (two) times daily.    . furosemide (LASIX) 80 MG tablet Take 80 mg by mouth 2 (two) times daily.    . insulin glargine (LANTUS) 100 UNIT/ML injection Inject 15 Units into the skin at bedtime.    . insulin  lispro (HUMALOG) 100 UNIT/ML injection Inject 12 Units into the skin 3 (three) times daily with meals. For every 50 over 200 add 1 unit.    . isosorbide mononitrate (IMDUR) 30 MG 24 hr tablet Take 30 mg by mouth 2 (two) times daily.    [name] levothyroxine (SYNTHROID, LEVOTHROID) 75 MCG tablet Take 1 tablet (75 mcg total) by mouth daily before breakfast. 90 tablet 1  . LYRICA 150 MG capsule TAKE 1 CAPSULE BY MOUTH EVERY MORNING AND 2 CAPSULE EVERY EVENING 90 capsule 0  . metolazone (ZAROXOLYN) 5 MG tablet Take 5 mg by mouth daily.    . Nutritional Supplements (FEEDING SUPPLEMENT, NEPRO CARB STEADY,) LIQD Take 237 mLs by mouth as needed (missed meal during dialysis.). 30 Can 0  . nystatin-triamcinolone (MYCOLOG II) cream Apply 1 application topically every 8 (eight) hours.    . Omega-3 Fatty Acids (OMEGA 3 PO) Take 1,000 mg by mouth daily.    [name] omeprazole (PRILOSEC) 40 MG capsule Take 40 mg by mouth daily.    [name] PARoxetine (PAXIL) 20 MG tablet Take 20 mg by mouth daily.    . rosuvastatin (CRESTOR) 20 MG tablet Take 1 tablet (20 mg total) by mouth at bedtime. 30 tablet 5  . saccharomyces boulardii (FLORASTOR) 250 MG capsule Take 1 capsule (250 mg total) by mouth 2 (two)  times daily. 30 capsule 0  . sevelamer carbonate (RENVELA) 800 MG tablet Take 800 mg by mouth 3 (three) times daily with meals.     No current facility-administered medications on file prior to visit.    Allergies  Allergen Reactions  . Amoxicillin-Pot Clavulanate Nausea And Vomiting  . Hydromorphone Hcl     "body started down"  08/22/14 - pt states no longer has problems with this  . Rifampin Nausea And Vomiting    Past Medical History  Diagnosis Date  . End stage renal disease on dialysis     LUE fistula  . Type I diabetes mellitus     a. 03/2014 admitted with HNK to [facility].  . Diabetic neuropathy     severe, s/p multiple toe amputation  . Hypothyroidism   . Hypertension   . Hyperlipidemia   . H/O hiatal hernia   . GERD  (gastroesophageal reflux disease)   . Anxiety   . Sebaceous cyst     side of neck  . Pneumonia     2010  . Anemia     a. req PRBC's 2011.  [name] PAD (peripheral artery disease)     a. s/p amputation of toes on the right;  b. left LE claudication.  . Coronary artery disease     a. s/p MI;  b. 10/2009 CABG x 3 @ [facility]: LIMA->LAD, VG->OM3, VG->RPDA; c. 11/2010 Cath 3/3 patent grafts;  d 12/2012 Cath: LM 30d, LAD 85p, D1 70, D2 90, LCX 40ost, OM2 100, RCA 90p, 169m, L->LAD ok, VG->OM3 ok, VG->RPDA 30, EF 50%->Med Rx.  . Cataract     right  . Valvular disease     a. 11/2012 Echo: EF 55-60%, mild LVH, mild MR, mild bi-atrial enlargement, mild-mod TR, PASP .  . Myocardial infarction   . CHF (congestive heart failure)   . Depression   . Arthritis     rheumatoid arthritis   . Hx of transfusion of packed red blood cells     Past Surgical History  Procedure Laterality Date  . Dialysis fistula creation      left upper arm fistula  . Amputation      TOES ON BOTH FEET  . Abscess drainage  Behind right ear/occipital scalp  . Skin graft    . Eye surgery  1999  . Coronary artery bypass graft  10/2009    [facility] (Dr. Katrinka Blazing)  . Foot amputation    . Cardiac catheterization    . Cardiac catheterization  2/14    [facility]: severe 3 vessel CAD with patent grafts, RHC: moderately elevated PCW and pulmonary hypertension  . Penile prosthesis implant N/A 07/24/2014    Procedure: SALINE PENILE INJECTION WITH DISSECTION OF CORPORA ,  IMPLANTATION OF COLOPLAST PENILE PROTHESIS INFLATABLE;  Surgeon: [name], MD;  Location: [facility];  Service: Urology;  Laterality: N/A;  . Removal of penile prosthesis N/A 08/22/2014    Procedure: EXPLANT OF INFECTED  PENILE PROSTHESIS;  Surgeon: [name], MD;  Location: [facility];  Service: Urology;  Laterality: N/A;    Family History  Problem Relation Age of Onset  . Heart disease Other   . Hypertension Other   . Hypertension Mother   . Heart disease  Mother   . Diabetes Mother   . Birth defects Paternal Uncle     unaware  . Birth defects Paternal Grandmother     breast    History   Social History  . Marital Status: Single  Spouse Name: N/A    Number of Children: N/A  . Years of Education: N/A   Occupational History  . Not on file.   Social History Main Topics  . Smoking status: Never Smoker   . Smokeless tobacco: Former Neurosurgeon    Types: Chew    Quit date: 08/23/1995  . Alcohol Use: 0.0 oz/week     Comment: occasional beer  . Drug Use: No  . Sexual Activity: Not Currently   Other Topics Concern  . Not on file   Social History Narrative   He is a DNR- yellow sheet given to pt today.   The PMH, PSH, Social History, Family History, Medications, and allergies have been reviewed in [facility], and have been updated if relevant.   Review of Systems  Constitutional: Negative for fever.  Gastrointestinal: Negative.   Genitourinary: Positive for genital sores. Negative for flank pain, discharge, penile swelling, penile pain and testicular pain.  All other systems reviewed and are negative.  See HPI    Objective:    BP 146/92 mmHg  Pulse 76  Temp(Src) 97.6 F (36.4 C) (Oral)  Wt 176 lb (79.833 kg)  SpO2 97%   Physical Exam  Constitutional: He is oriented to person, place, and time. He appears well-developed and well-nourished.  HENT:  Head: Normocephalic.  Cardiovascular: Normal rate.   Pulmonary/Chest: Effort normal.  Musculoskeletal: He exhibits no edema.  Neurological: He is alert and oriented to person, place, and time.  Skin: Skin is warm and dry.  Psychiatric: He has a normal mood and affect. His behavior is normal. Judgment and thought content normal.  Vitals reviewed.         Assessment & Plan:   Anxiety state No Follow-up on file.

## 2014-10-11 NOTE — Assessment & Plan Note (Signed)
Had deteriorated with recent health issues but he feels symptoms improved on Paxil and rarely used prn Ativan.

## 2014-10-17 ENCOUNTER — Other Ambulatory Visit: Payer: Self-pay | Admitting: Family Medicine

## 2014-10-22 NOTE — Telephone Encounter (Signed)
Pt called requesting refill lyrica; advised per 10/17/14 refill request to contact Dr Thedore Mins; pt said Dr Thedore Mins does not refill his Lyrica; pt said he is in a lot of pain and request refill of lyrica by Dr [name]. Spoke with Delisa at [facility] and she said Dr Thedore Mins denied refill for Lyrica.Please advise.

## 2014-10-23 NOTE — Telephone Encounter (Signed)
Lm on pts vm requesting a call back. Per Dr [name], pt will need to receive Rx from Dr Thedore Mins. His office may have denied Rx, because per pharmacy (see previous notes) Dr Thedore Mins prescribes pt 75mg , which is why he probabl denied request for 150mg 

## 2014-12-05 ENCOUNTER — Other Ambulatory Visit: Payer: Self-pay | Admitting: Urology

## 2014-12-06 NOTE — Progress Notes (Signed)
Called requested  Orders be release to Epic in sign and held for Same day surgery Monday February 12-10-14 Thanks

## 2014-12-07 ENCOUNTER — Encounter (HOSPITAL_COMMUNITY): Payer: Self-pay | Admitting: *Deleted

## 2014-12-09 MED ORDER — GENTAMICIN SULFATE 40 MG/ML IJ SOLN
120.0000 mg | INTRAVENOUS | Status: DC
  Filled 2014-12-09: qty 3

## 2014-12-10 ENCOUNTER — Encounter (HOSPITAL_COMMUNITY)
Admission: RE | Disposition: A | Discharge status: Still In Hospital | Payer: Self-pay | Source: Ambulatory Visit | Attending: Urology

## 2014-12-10 ENCOUNTER — Ambulatory Visit (HOSPITAL_COMMUNITY): Payer: Medicare Other | Admitting: Anesthesiology

## 2014-12-10 ENCOUNTER — Encounter (HOSPITAL_COMMUNITY): Payer: Self-pay | Admitting: Emergency Medicine

## 2014-12-10 ENCOUNTER — Encounter (HOSPITAL_COMMUNITY): Payer: Self-pay

## 2014-12-10 ENCOUNTER — Ambulatory Visit (HOSPITAL_COMMUNITY)
Admission: RE | Admit: 2014-12-10 | Discharge: 2014-12-10 | Disposition: A | Discharge status: Still In Hospital | Payer: Medicare Other | Source: Ambulatory Visit | Attending: Urology | Admitting: Urology

## 2014-12-10 ENCOUNTER — Inpatient Hospital Stay (HOSPITAL_COMMUNITY)
Admission: EM | Admit: 2014-12-10 | Discharge: 2014-12-21 | DRG: 987 | Disposition: A | Discharge status: Home / Self Care | Payer: Medicare Other | Attending: Internal Medicine | Admitting: Internal Medicine

## 2014-12-10 DIAGNOSIS — Z8249 Family history of ischemic heart disease and other diseases of the circulatory system: Secondary | ICD-10-CM

## 2014-12-10 DIAGNOSIS — B3741 Candidal cystitis and urethritis: Secondary | ICD-10-CM | POA: Diagnosis present

## 2014-12-10 DIAGNOSIS — F410 Panic disorder [episodic paroxysmal anxiety] without agoraphobia: Secondary | ICD-10-CM | POA: Diagnosis present

## 2014-12-10 DIAGNOSIS — K219 Gastro-esophageal reflux disease without esophagitis: Secondary | ICD-10-CM | POA: Insufficient documentation

## 2014-12-10 DIAGNOSIS — I081 Rheumatic disorders of both mitral and tricuspid valves: Secondary | ICD-10-CM | POA: Diagnosis present

## 2014-12-10 DIAGNOSIS — Z79899 Other long term (current) drug therapy: Secondary | ICD-10-CM

## 2014-12-10 DIAGNOSIS — E780 Pure hypercholesterolemia: Secondary | ICD-10-CM | POA: Insufficient documentation

## 2014-12-10 DIAGNOSIS — D649 Anemia, unspecified: Secondary | ICD-10-CM | POA: Diagnosis present

## 2014-12-10 DIAGNOSIS — Z7982 Long term (current) use of aspirin: Secondary | ICD-10-CM

## 2014-12-10 DIAGNOSIS — Z885 Allergy status to narcotic agent status: Secondary | ICD-10-CM | POA: Diagnosis not present

## 2014-12-10 DIAGNOSIS — I120 Hypertensive chronic kidney disease with stage 5 chronic kidney disease or end stage renal disease: Secondary | ICD-10-CM | POA: Diagnosis present

## 2014-12-10 DIAGNOSIS — I509 Heart failure, unspecified: Secondary | ICD-10-CM | POA: Insufficient documentation

## 2014-12-10 DIAGNOSIS — N492 Inflammatory disorders of scrotum: Secondary | ICD-10-CM | POA: Diagnosis present

## 2014-12-10 DIAGNOSIS — M1990 Unspecified osteoarthritis, unspecified site: Secondary | ICD-10-CM | POA: Diagnosis present

## 2014-12-10 DIAGNOSIS — I739 Peripheral vascular disease, unspecified: Secondary | ICD-10-CM | POA: Diagnosis present

## 2014-12-10 DIAGNOSIS — I2510 Atherosclerotic heart disease of native coronary artery without angina pectoris: Secondary | ICD-10-CM

## 2014-12-10 DIAGNOSIS — E1065 Type 1 diabetes mellitus with hyperglycemia: Secondary | ICD-10-CM | POA: Diagnosis present

## 2014-12-10 DIAGNOSIS — N3080 Other cystitis without hematuria: Secondary | ICD-10-CM | POA: Diagnosis present

## 2014-12-10 DIAGNOSIS — K5900 Constipation, unspecified: Secondary | ICD-10-CM | POA: Diagnosis not present

## 2014-12-10 DIAGNOSIS — Z833 Family history of diabetes mellitus: Secondary | ICD-10-CM

## 2014-12-10 DIAGNOSIS — N390 Urinary tract infection, site not specified: Secondary | ICD-10-CM | POA: Diagnosis present

## 2014-12-10 DIAGNOSIS — E1165 Type 2 diabetes mellitus with hyperglycemia: Secondary | ICD-10-CM

## 2014-12-10 DIAGNOSIS — Z539 Procedure and treatment not carried out, unspecified reason: Secondary | ICD-10-CM | POA: Insufficient documentation

## 2014-12-10 DIAGNOSIS — Z89429 Acquired absence of other toe(s), unspecified side: Secondary | ICD-10-CM | POA: Diagnosis not present

## 2014-12-10 DIAGNOSIS — Z880 Allergy status to penicillin: Secondary | ICD-10-CM

## 2014-12-10 DIAGNOSIS — N186 End stage renal disease: Secondary | ICD-10-CM | POA: Diagnosis not present

## 2014-12-10 DIAGNOSIS — E1043 Type 1 diabetes mellitus with diabetic autonomic (poly)neuropathy: Secondary | ICD-10-CM | POA: Diagnosis present

## 2014-12-10 DIAGNOSIS — R197 Diarrhea, unspecified: Secondary | ICD-10-CM | POA: Diagnosis not present

## 2014-12-10 DIAGNOSIS — Z888 Allergy status to other drugs, medicaments and biological substances status: Secondary | ICD-10-CM | POA: Insufficient documentation

## 2014-12-10 DIAGNOSIS — E871 Hypo-osmolality and hyponatremia: Secondary | ICD-10-CM | POA: Diagnosis present

## 2014-12-10 DIAGNOSIS — E039 Hypothyroidism, unspecified: Secondary | ICD-10-CM

## 2014-12-10 DIAGNOSIS — I252 Old myocardial infarction: Secondary | ICD-10-CM

## 2014-12-10 DIAGNOSIS — Z992 Dependence on renal dialysis: Secondary | ICD-10-CM

## 2014-12-10 DIAGNOSIS — E876 Hypokalemia: Secondary | ICD-10-CM | POA: Diagnosis not present

## 2014-12-10 DIAGNOSIS — R739 Hyperglycemia, unspecified: Secondary | ICD-10-CM | POA: Diagnosis present

## 2014-12-10 DIAGNOSIS — Z951 Presence of aortocoronary bypass graft: Secondary | ICD-10-CM | POA: Diagnosis not present

## 2014-12-10 DIAGNOSIS — Z881 Allergy status to other antibiotic agents status: Secondary | ICD-10-CM | POA: Diagnosis not present

## 2014-12-10 DIAGNOSIS — Z89439 Acquired absence of unspecified foot: Secondary | ICD-10-CM | POA: Diagnosis not present

## 2014-12-10 DIAGNOSIS — Z794 Long term (current) use of insulin: Secondary | ICD-10-CM

## 2014-12-10 DIAGNOSIS — IMO0002 Reserved for concepts with insufficient information to code with codable children: Secondary | ICD-10-CM | POA: Diagnosis present

## 2014-12-10 DIAGNOSIS — F419 Anxiety disorder, unspecified: Secondary | ICD-10-CM

## 2014-12-10 DIAGNOSIS — E1142 Type 2 diabetes mellitus with diabetic polyneuropathy: Secondary | ICD-10-CM

## 2014-12-10 DIAGNOSIS — E1022 Type 1 diabetes mellitus with diabetic chronic kidney disease: Secondary | ICD-10-CM | POA: Diagnosis present

## 2014-12-10 DIAGNOSIS — E785 Hyperlipidemia, unspecified: Secondary | ICD-10-CM | POA: Diagnosis present

## 2014-12-10 DIAGNOSIS — M069 Rheumatoid arthritis, unspecified: Secondary | ICD-10-CM | POA: Diagnosis present

## 2014-12-10 DIAGNOSIS — E875 Hyperkalemia: Secondary | ICD-10-CM | POA: Diagnosis not present

## 2014-12-10 DIAGNOSIS — E7251 Non-ketotic hyperglycinemia: Secondary | ICD-10-CM | POA: Diagnosis present

## 2014-12-10 DIAGNOSIS — N4821 Abscess of corpus cavernosum and penis: Secondary | ICD-10-CM | POA: Diagnosis present

## 2014-12-10 DIAGNOSIS — F329 Major depressive disorder, single episode, unspecified: Secondary | ICD-10-CM | POA: Insufficient documentation

## 2014-12-10 DIAGNOSIS — E1143 Type 2 diabetes mellitus with diabetic autonomic (poly)neuropathy: Secondary | ICD-10-CM | POA: Diagnosis present

## 2014-12-10 DIAGNOSIS — I10 Essential (primary) hypertension: Secondary | ICD-10-CM | POA: Diagnosis present

## 2014-12-10 LAB — CBC
HCT: 37 % — ABNORMAL LOW (ref 39.0–52.0)
HEMATOCRIT: 34.5 % — AB (ref 39.0–52.0)
Hemoglobin: 11.6 g/dL — ABNORMAL LOW (ref 13.0–17.0)
Hemoglobin: 11.3 g/dL — ABNORMAL LOW (ref 13.0–17.0)
MCH: 28.3 pg (ref 26.0–34.0)
MCH: 28.5 pg (ref 26.0–34.0)
MCHC: 31.4 g/dL (ref 30.0–36.0)
MCHC: 32.8 g/dL (ref 30.0–36.0)
MCV: 90.2 fL (ref 78.0–100.0)
MCV: 87.1 fL (ref 78.0–100.0)
PLATELETS: 104 10*3/uL — AB (ref 150–400)
PLATELETS: 114 10*3/uL — AB (ref 150–400)
RBC: 4.1 MIL/uL — AB (ref 4.22–5.81)
RBC: 3.96 MIL/uL — ABNORMAL LOW (ref 4.22–5.81)
RDW: 14.9 % (ref 11.5–15.5)
RDW: 14.8 % (ref 11.5–15.5)
WBC: 6.5 10*3/uL (ref 4.0–10.5)
WBC: 7.3 10*3/uL (ref 4.0–10.5)

## 2014-12-10 LAB — URINALYSIS, ROUTINE W REFLEX MICROSCOPIC
BILIRUBIN URINE: NEGATIVE
Glucose, UA: >1000 mg/dL — AB
Ketones, ur: NEGATIVE mg/dL
NITRITE: NEGATIVE
PH: 7 (ref 5.0–8.0)
SPECIFIC GRAVITY, URINE: 1.019 (ref 1.005–1.030)
Urobilinogen, UA: 0.2 mg/dL (ref 0.0–1.0)

## 2014-12-10 LAB — CBC WITH DIFFERENTIAL/PLATELET
Basophils Absolute: 0 10*3/uL (ref 0.0–0.1)
Basophils Relative: 0 % (ref 0–1)
EOS ABS: 0.1 10*3/uL (ref 0.0–0.7)
Eosinophils Relative: 1 % (ref 0–5)
HEMATOCRIT: 36.8 % — AB (ref 39.0–52.0)
Hemoglobin: 11.8 g/dL — ABNORMAL LOW (ref 13.0–17.0)
LYMPHS ABS: 0.7 10*3/uL (ref 0.7–4.0)
Lymphocytes Relative: 9 % — ABNORMAL LOW (ref 12–46)
MCH: 29.1 pg (ref 26.0–34.0)
MCHC: 32.1 g/dL (ref 30.0–36.0)
MCV: 90.6 fL (ref 78.0–100.0)
Monocytes Absolute: 0.5 10*3/uL (ref 0.1–1.0)
Monocytes Relative: 7 % (ref 3–12)
Neutro Abs: 6.1 10*3/uL (ref 1.7–7.7)
Neutrophils Relative %: 83 % — ABNORMAL HIGH (ref 43–77)
Platelets: 100 10*3/uL — ABNORMAL LOW (ref 150–400)
RBC: 4.06 MIL/uL — ABNORMAL LOW (ref 4.22–5.81)
RDW: 15 % (ref 11.5–15.5)
WBC: 7.3 10*3/uL (ref 4.0–10.5)

## 2014-12-10 LAB — COMPREHENSIVE METABOLIC PANEL
ALT: 25 U/L (ref 0–53)
AST: 24 U/L (ref 0–37)
Albumin: 3.1 g/dL — ABNORMAL LOW (ref 3.5–5.2)
Alkaline Phosphatase: 299 U/L — ABNORMAL HIGH (ref 39–117)
Anion gap: 13 (ref 5–15)
BUN: 59 mg/dL — AB (ref 6–23)
CALCIUM: 9.1 mg/dL (ref 8.4–10.5)
CO2: 22 mmol/L (ref 19–32)
CREATININE: 4.69 mg/dL — AB (ref 0.50–1.35)
Chloride: 88 mmol/L — ABNORMAL LOW (ref 96–112)
GFR calc non Af Amer: 14 mL/min — ABNORMAL LOW (ref >90)
GFR, EST AFRICAN AMERICAN: 16 mL/min — AB (ref >90)
Glucose, Bld: 785 mg/dL (ref 70–99)
Potassium: 4.2 mmol/L (ref 3.5–5.1)
Sodium: 123 mmol/L — ABNORMAL LOW (ref 135–145)
TOTAL PROTEIN: 7.4 g/dL (ref 6.0–8.3)
Total Bilirubin: 1 mg/dL (ref 0.3–1.2)

## 2014-12-10 LAB — GLUCOSE, CAPILLARY
GLUCOSE-CAPILLARY: 87 mg/dL (ref 70–99)
Glucose-Capillary: >600 mg/dL (ref 70–99)
Glucose-Capillary: 94 mg/dL (ref 70–99)

## 2014-12-10 LAB — BASIC METABOLIC PANEL
ANION GAP: 13 (ref 5–15)
Anion gap: 12 (ref 5–15)
BUN: 60 mg/dL — AB (ref 6–23)
BUN: 57 mg/dL — ABNORMAL HIGH (ref 6–23)
CO2: 22 mmol/L (ref 19–32)
CO2: 25 mmol/L (ref 19–32)
Calcium: 9.1 mg/dL (ref 8.4–10.5)
Calcium: 9.4 mg/dL (ref 8.4–10.5)
Chloride: 85 mmol/L — ABNORMAL LOW (ref 96–112)
Chloride: 94 mmol/L — ABNORMAL LOW (ref 96–112)
Creatinine, Ser: 4.81 mg/dL — ABNORMAL HIGH (ref 0.50–1.35)
Creatinine, Ser: 4.96 mg/dL — ABNORMAL HIGH (ref 0.50–1.35)
GFR calc Af Amer: 15 mL/min — ABNORMAL LOW (ref >90)
GFR calc Af Amer: 15 mL/min — ABNORMAL LOW (ref >90)
GFR, EST NON AFRICAN AMERICAN: 13 mL/min — AB (ref >90)
GFR, EST NON AFRICAN AMERICAN: 13 mL/min — AB (ref >90)
Glucose, Bld: 802 mg/dL (ref 70–99)
Glucose, Bld: 81 mg/dL (ref 70–99)
Potassium: 3.8 mmol/L (ref 3.5–5.1)
Potassium: 4.2 mmol/L (ref 3.5–5.1)
Sodium: 120 mmol/L — ABNORMAL LOW (ref 135–145)
Sodium: 131 mmol/L — ABNORMAL LOW (ref 135–145)

## 2014-12-10 LAB — CBG MONITORING, ED
GLUCOSE-CAPILLARY: 435 mg/dL — AB (ref 70–99)
GLUCOSE-CAPILLARY: 322 mg/dL — AB (ref 70–99)
Glucose-Capillary: >600 mg/dL (ref 70–99)
Glucose-Capillary: 518 mg/dL — ABNORMAL HIGH (ref 70–99)
Glucose-Capillary: 188 mg/dL — ABNORMAL HIGH (ref 70–99)
Glucose-Capillary: 137 mg/dL — ABNORMAL HIGH (ref 70–99)

## 2014-12-10 LAB — URINE MICROSCOPIC-ADD ON

## 2014-12-10 SURGERY — WOUND EXPLORATION
Anesthesia: General

## 2014-12-10 MED ORDER — SEVELAMER CARBONATE 800 MG PO TABS
800.0000 mg | ORAL_TABLET | Freq: Three times a day (TID) | ORAL | Status: DC
  Filled 2014-12-10 (×3): qty 1

## 2014-12-10 MED ORDER — CARVEDILOL 25 MG PO TABS
25.0000 mg | ORAL_TABLET | Freq: Once | ORAL | Status: AC
  Administered 2014-12-10: 25 mg via ORAL
  Filled 2014-12-10: qty 1

## 2014-12-10 MED ORDER — PROPOFOL 10 MG/ML IV BOLUS
INTRAVENOUS | Status: AC
  Filled 2014-12-10: qty 20

## 2014-12-10 MED ORDER — HEPARIN SODIUM (PORCINE) 5000 UNIT/ML IJ SOLN
5000.0000 [IU] | Freq: Three times a day (TID) | INTRAMUSCULAR | Status: DC
  Administered 2014-12-11: 5000 [IU] via SUBCUTANEOUS
  Filled 2014-12-10 (×3): qty 1

## 2014-12-10 MED ORDER — PREGABALIN 75 MG PO CAPS
150.0000 mg | ORAL_CAPSULE | Freq: Two times a day (BID) | ORAL | Status: DC
  Administered 2014-12-10 – 2014-12-21 (×21): 150 mg via ORAL
  Filled 2014-12-10 (×21): qty 2

## 2014-12-10 MED ORDER — SODIUM CHLORIDE 0.9 % IV SOLN
250.0000 mg | Freq: Four times a day (QID) | INTRAVENOUS | Status: DC
  Administered 2014-12-10 – 2014-12-17 (×26): 250 mg via INTRAVENOUS
  Filled 2014-12-10 (×31): qty 250

## 2014-12-10 MED ORDER — FENTANYL CITRATE 0.05 MG/ML IJ SOLN
INTRAMUSCULAR | Status: AC
  Filled 2014-12-10: qty 2

## 2014-12-10 MED ORDER — CARVEDILOL 25 MG PO TABS
25.0000 mg | ORAL_TABLET | Freq: Two times a day (BID) | ORAL | Status: DC
  Administered 2014-12-11 – 2014-12-21 (×18): 25 mg via ORAL
  Filled 2014-12-10 (×25): qty 1

## 2014-12-10 MED ORDER — ONDANSETRON HCL 4 MG PO TABS
4.0000 mg | ORAL_TABLET | Freq: Four times a day (QID) | ORAL | Status: DC | PRN
  Administered 2014-12-17: 4 mg via ORAL
  Filled 2014-12-10: qty 1

## 2014-12-10 MED ORDER — SODIUM CHLORIDE 0.9 % IV SOLN
INTRAVENOUS | Status: DC
  Administered 2014-12-10: 5.4 [IU]/h via INTRAVENOUS
  Filled 2014-12-10: qty 2.5

## 2014-12-10 MED ORDER — LEVOTHYROXINE SODIUM 75 MCG PO TABS
75.0000 ug | ORAL_TABLET | Freq: Every day | ORAL | Status: DC
  Administered 2014-12-11 – 2014-12-21 (×11): 75 ug via ORAL
  Filled 2014-12-10 (×14): qty 1

## 2014-12-10 MED ORDER — CINACALCET HCL 30 MG PO TABS
60.0000 mg | ORAL_TABLET | Freq: Every day | ORAL | Status: DC
  Administered 2014-12-10 – 2014-12-20 (×11): 60 mg via ORAL
  Filled 2014-12-10 (×12): qty 2

## 2014-12-10 MED ORDER — INSULIN GLARGINE 100 UNIT/ML ~~LOC~~ SOLN
25.0000 [IU] | Freq: Every day | SUBCUTANEOUS | Status: DC
  Filled 2014-12-10 (×2): qty 0.25

## 2014-12-10 MED ORDER — INSULIN ASPART 100 UNIT/ML ~~LOC~~ SOLN
0.0000 [IU] | Freq: Three times a day (TID) | SUBCUTANEOUS | Status: DC
  Administered 2014-12-11: 3 [IU] via SUBCUTANEOUS
  Administered 2014-12-11 – 2014-12-12 (×2): 5 [IU] via SUBCUTANEOUS
  Administered 2014-12-12: 1 [IU] via SUBCUTANEOUS
  Administered 2014-12-12: 7 [IU] via SUBCUTANEOUS
  Administered 2014-12-13 – 2014-12-14 (×2): 2 [IU] via SUBCUTANEOUS
  Administered 2014-12-15: 9 [IU] via SUBCUTANEOUS
  Administered 2014-12-16: 1 [IU] via SUBCUTANEOUS
  Administered 2014-12-16 – 2014-12-17 (×3): 3 [IU] via SUBCUTANEOUS
  Administered 2014-12-17: 2 [IU] via SUBCUTANEOUS
  Administered 2014-12-17: 1 [IU] via SUBCUTANEOUS
  Administered 2014-12-18: 2 [IU] via SUBCUTANEOUS
  Administered 2014-12-18 – 2014-12-19 (×2): 1 [IU] via SUBCUTANEOUS
  Administered 2014-12-20: 2 [IU] via SUBCUTANEOUS
  Administered 2014-12-21: 3 [IU] via SUBCUTANEOUS

## 2014-12-10 MED ORDER — MIDAZOLAM HCL 2 MG/2ML IJ SOLN
INTRAMUSCULAR | Status: AC
  Filled 2014-12-10: qty 2

## 2014-12-10 MED ORDER — CITALOPRAM HYDROBROMIDE 20 MG PO TABS
20.0000 mg | ORAL_TABLET | Freq: Every day | ORAL | Status: DC
  Administered 2014-12-10 – 2014-12-21 (×12): 20 mg via ORAL
  Filled 2014-12-10 (×12): qty 1

## 2014-12-10 MED ORDER — ONDANSETRON HCL 4 MG/2ML IJ SOLN: 4.0000 mg | Freq: Four times a day (QID) | INTRAMUSCULAR | Status: DC | PRN

## 2014-12-10 MED ORDER — ISOSORBIDE MONONITRATE ER 30 MG PO TB24
30.0000 mg | ORAL_TABLET | Freq: Two times a day (BID) | ORAL | Status: DC
  Administered 2014-12-10 – 2014-12-21 (×19): 30 mg via ORAL
  Filled 2014-12-10 (×24): qty 1

## 2014-12-10 MED ORDER — ACETAMINOPHEN 500 MG PO TABS
1000.0000 mg | ORAL_TABLET | Freq: Four times a day (QID) | ORAL | Status: DC | PRN
  Administered 2014-12-14: 1000 mg via ORAL
  Filled 2014-12-10: qty 2

## 2014-12-10 MED ORDER — SODIUM CHLORIDE 0.9 % IV SOLN
INTRAVENOUS | Status: DC
  Administered 2014-12-10: 13:00:00 via INTRAVENOUS

## 2014-12-10 MED ORDER — AMITRIPTYLINE HCL 25 MG PO TABS
25.0000 mg | ORAL_TABLET | Freq: Every day | ORAL | Status: DC
  Administered 2014-12-10 – 2014-12-20 (×11): 25 mg via ORAL
  Filled 2014-12-10 (×12): qty 1

## 2014-12-10 MED ORDER — LIDOCAINE HCL (CARDIAC) 20 MG/ML IV SOLN
INTRAVENOUS | Status: AC
  Filled 2014-12-10: qty 5

## 2014-12-10 MED ORDER — CLONIDINE HCL 0.1 MG PO TABS
0.1000 mg | ORAL_TABLET | Freq: Two times a day (BID) | ORAL | Status: DC
  Administered 2014-12-10 – 2014-12-21 (×19): 0.1 mg via ORAL
  Filled 2014-12-10 (×27): qty 1

## 2014-12-10 MED ORDER — PAROXETINE HCL 20 MG PO TABS
20.0000 mg | ORAL_TABLET | Freq: Every morning | ORAL | Status: DC
  Administered 2014-12-11 – 2014-12-21 (×11): 20 mg via ORAL
  Filled 2014-12-10 (×12): qty 1

## 2014-12-10 MED ORDER — ROSUVASTATIN CALCIUM 20 MG PO TABS
20.0000 mg | ORAL_TABLET | Freq: Every day | ORAL | Status: DC
  Administered 2014-12-10 – 2014-12-20 (×11): 20 mg via ORAL
  Filled 2014-12-10 (×12): qty 1

## 2014-12-10 MED ORDER — ASPIRIN 81 MG PO CHEW
81.0000 mg | CHEWABLE_TABLET | Freq: Every morning | ORAL | Status: DC
  Administered 2014-12-11 – 2014-12-21 (×11): 81 mg via ORAL
  Filled 2014-12-10 (×10): qty 1

## 2014-12-10 MED ORDER — CLINDAMYCIN PHOSPHATE 900 MG/50ML IV SOLN: 900.0000 mg | INTRAVENOUS | Status: DC

## 2014-12-10 NOTE — Progress Notes (Signed)
DR Patsi Sears and Dr Emelda Fear ( resident) into see patient

## 2014-12-10 NOTE — ED Provider Notes (Signed)
CSN: [id number]     Arrival date & time 12/10/14  5400 History   First MD Initiated Contact with Patient 12/10/14 0940     Chief Complaint  Patient presents with  . Hyperglycemia     (Consider location/radiation/quality/duration/timing/severity/associated sxs/prior Treatment) HPI Comments: Patient is a 44 year old male with extensive past medical history including insulin-dependent diabetes, end-stage renal disease on hemodialysis, and coronary artery disease with bypass graft. He presents today with complaints of elevated blood sugar. He was to undergo bilateral corporotomy for some sort of penile infection by Dr. Patsi Sears. While he was in preop, his sugars were found to be 800, making the surgery impossible to perform. He was then sent here to the emergency room for further evaluation. Patient tells me that his sugars have been elevated recently he believes due to the infection, however now cannot have the surgery performed due to the elevated sugars. He is somewhat frustrated by this.  Patient is a 44 y.o. male presenting with hyperglycemia. The history is provided by the patient.  Hyperglycemia Blood sugar level PTA:  >600 Severity:  Severe Onset quality:  Sudden Timing:  Constant Progression:  Worsening Chronicity:  New Diabetes status:  Controlled with insulin Context: not change in medication   Relieved by:  Nothing Ineffective treatments:  None tried   Past Medical History  Diagnosis Date  . End stage renal disease on dialysis     LUE fistula  . Type I diabetes mellitus     a. 03/2014 admitted with HNK to [facility].  . Diabetic neuropathy     severe, s/p multiple toe amputation  . Hypothyroidism   . Hypertension   . Hyperlipidemia   . H/O hiatal hernia   . GERD (gastroesophageal reflux disease)   . Anxiety   . Sebaceous cyst     side of neck  . Pneumonia     2010  . Anemia     a. req PRBC's 2011.  [name] PAD (peripheral artery disease)     a. s/p amputation of toes on the  right;  b. left LE claudication.  . Coronary artery disease     a. s/p MI;  b. 10/2009 CABG x 3 @ [facility]: LIMA->LAD, VG->OM3, VG->RPDA; c. 11/2010 Cath 3/3 patent grafts;  d 12/2012 Cath: LM 30d, LAD 85p, D1 70, D2 90, LCX 40ost, OM2 100, RCA 90p, 19m, L->LAD ok, VG->OM3 ok, VG->RPDA 30, EF 50%->Med Rx.  . Cataract     right  . Valvular disease     a. 11/2012 Echo: EF 55-60%, mild LVH, mild MR, mild bi-atrial enlargement, mild-mod TR, PASP .  . CHF (congestive heart failure)   . Depression   . Arthritis     rheumatoid arthritis   . Hx of transfusion of packed red blood cells   . Myocardial infarction 2010   Past Surgical History  Procedure Laterality Date  . Dialysis fistula creation      left upper arm fistula  . Amputation      TOES ON BOTH FEET  . Abscess drainage  Behind right ear/occipital scalp  . Skin graft    . Eye surgery  1999  . Coronary artery bypass graft  10/2009    [facility] (Dr. Katrinka Blazing)  . Foot amputation    . Cardiac catheterization    . Cardiac catheterization  2/14    [facility]: severe 3 vessel CAD with patent grafts, RHC: moderately elevated PCW and pulmonary hypertension  . Penile prosthesis implant N/A 07/24/2014  Procedure: SALINE PENILE INJECTION WITH DISSECTION OF CORPORA ,  IMPLANTATION OF COLOPLAST PENILE PROTHESIS INFLATABLE;  Surgeon: [name], MD;  Location: [facility];  Service: Urology;  Laterality: N/A;  . Removal of penile prosthesis N/A 08/22/2014    Procedure: EXPLANT OF INFECTED  PENILE PROSTHESIS;  Surgeon: [name], MD;  Location: [facility];  Service: Urology;  Laterality: N/A;   Family History  Problem Relation Age of Onset  . Heart disease Other   . Hypertension Other   . Hypertension Mother   . Heart disease Mother   . Diabetes Mother   . Birth defects Paternal Uncle     unaware  . Birth defects Paternal Grandmother     breast   History  Substance Use Topics  . Smoking status: Never Smoker   . Smokeless tobacco: Former  Neurosurgeon    Types: Chew    Quit date: 08/23/1995  . Alcohol Use: 0.0 oz/week     Comment: occasional beer    Review of Systems  All other systems reviewed and are negative.     Allergies  Amoxicillin-pot clavulanate; Hydromorphone hcl; and Rifampin  Home Medications   Prior to Admission medications   Medication Sig Start Date End Date Taking? Authorizing Provider  acetaminophen (TYLENOL) 500 MG tablet Take 1,000 mg by mouth every 6 (six) hours as needed (pain).    Historical Provider, MD  amitriptyline (ELAVIL) 25 MG tablet Take 1 tablet (25 mg total) by mouth at bedtime. 08/21/14   Dianne Dun, MD  aspirin 81 MG tablet Take 81 mg by mouth every morning.     Historical Provider, MD  carvedilol (COREG) 25 MG tablet Take 25 mg by mouth 2 (two) times daily with a meal.    Historical Provider, MD  cinacalcet (SENSIPAR) 60 MG tablet Take 60 mg by mouth at bedtime. With biggest meal 02/16/11   Dianne Dun, MD  citalopram (CELEXA) 20 MG tablet TAKE 1 BY MOUTH DAILY Patient taking differently: TAKE 1 BY MOUTH NIGHTLY AT BEDTIME. 09/27/14   Dianne Dun, MD  cloNIDine (CATAPRES) 0.1 MG tablet Take 0.1 mg by mouth 2 (two) times daily.    Historical Provider, MD  furosemide (LASIX) 80 MG tablet Take 80 mg by mouth 2 (two) times daily.    Historical Provider, MD  insulin glargine (LANTUS) 100 UNIT/ML injection Inject 25 Units into the skin at bedtime.     Historical Provider, MD  insulin lispro (HUMALOG) 100 UNIT/ML injection Inject 12 Units into the skin 3 (three) times daily with meals. For every 50 over 200 add 1 unit. 04/26/14   Carlus Pavlov, MD  isosorbide mononitrate (IMDUR) 30 MG 24 hr tablet Take 30 mg by mouth 2 (two) times daily.    Historical Provider, MD  levothyroxine (SYNTHROID, LEVOTHROID) 75 MCG tablet Take 1 tablet (75 mcg total) by mouth daily before breakfast. 07/26/13   Dianne Dun, MD  LYRICA 150 MG capsule TAKE 1 CAPSULE BY MOUTH EVERY MORNING AND 2 CAPSULE EVERY EVENING  10/01/14   Dianne Dun, MD  metolazone (ZAROXOLYN) 5 MG tablet Take 5 mg by mouth every morning.     Historical Provider, MD  Nutritional Supplements (FEEDING SUPPLEMENT, NEPRO CARB STEADY,) LIQD Take 237 mLs by mouth as needed (missed meal during dialysis.). 08/24/14   Richarda Overlie, MD  nystatin-triamcinolone (MYCOLOG II) cream Apply 1 application topically every 8 (eight) hours.    Historical Provider, MD  Omega-3 Fatty Acids (OMEGA 3 PO)  Take 1,000 mg by mouth daily.    Historical Provider, MD  omeprazole (PRILOSEC) 40 MG capsule Take 40 mg by mouth every morning.     Historical Provider, MD  PARoxetine (PAXIL) 20 MG tablet Take 20 mg by mouth every morning.     Historical Provider, MD  rosuvastatin (CRESTOR) 20 MG tablet Take 1 tablet (20 mg total) by mouth at bedtime. 12/13/12   Dianne Dun, MD  saccharomyces boulardii (FLORASTOR) 250 MG capsule Take 1 capsule (250 mg total) by mouth 2 (two) times daily. 08/27/14   Richarda Overlie, MD  sevelamer carbonate (RENVELA) 800 MG tablet Take 800 mg by mouth 3 (three) times daily with meals.    Historical Provider, MD   There were no vitals taken for this visit. Physical Exam  Constitutional: He is oriented to person, place, and time. He appears well-developed and well-nourished. No distress.  HENT:  Head: Normocephalic and atraumatic.  Mouth/Throat: Oropharynx is clear and moist.  Neck: Normal range of motion. Neck supple.  Cardiovascular: Normal rate, regular rhythm and normal heart sounds.   No murmur heard. Pulmonary/Chest: Effort normal and breath sounds normal. No respiratory distress. He has no wheezes.  Abdominal: Soft. Bowel sounds are normal. He exhibits no distension. There is no tenderness.  Musculoskeletal: Normal range of motion. He exhibits no edema.  Lymphadenopathy:    He has no cervical adenopathy.  Neurological: He is alert and oriented to person, place, and time. No cranial nerve deficit. He exhibits normal muscle tone.  Coordination normal.  Skin: Skin is warm and dry. He is not diaphoretic.  Nursing note and vitals reviewed.   ED Course  Procedures (including critical care time) Labs Review Labs Reviewed  CBG MONITORING, ED - Abnormal; Notable for the following:    Glucose-Capillary >600 (*)    All other components within normal limits  URINALYSIS, ROUTINE W REFLEX MICROSCOPIC  CBC WITH DIFFERENTIAL/PLATELET  COMPREHENSIVE METABOLIC PANEL    Imaging Review No results found.   EKG Interpretation   Date/Time:  Monday December 10 2014 09:58:22 EST Ventricular Rate:  99 PR Interval:  183 QRS Duration: 137 QT Interval:  392 QTC Calculation: 503 R Axis:   116 Text Interpretation:  Sinus rhythm Probable left atrial enlargement RBBB  and LPFB Baseline wander in lead(s) V3 No significant change since last  tracing Confirmed by YAO  MD, DAVID ([id number]) on 12/10/2014 10:09:16 AM      MDM   Final diagnoses:  None    Patient is a 44 year old male with extensive past medical history including type 1 diabetes, coronary artery disease with bypass graft, and end-stage renal disease on hemodialysis. He was to undergo a procedure on a penile infection, however was found to have sugars in excess of 800. He was sent to the ER for evaluation of this as they did not feel as though proceeding with the surgery would be in his best interest. He was started on a glucose stabilizer and internal medicine was consulted for admission. As he is a dialysis patient, he will be transferred to [facility] for this admission.    Geoffery Lyons, MD 12/11/14 [phone]

## 2014-12-10 NOTE — ED Notes (Signed)
Bed: MA00 Expected date:  Expected time:  Means of arrival:  Comments: Short stay-hyperglycemia

## 2014-12-10 NOTE — H&P (Addendum)
PCP:   [name], MD   Chief Complaint:  Hyperglycemia   HPI:  44 year old male who  has a past medical history of End stage renal disease on dialysis; Type I diabetes mellitus; Diabetic neuropathy; Hypothyroidism; Hypertension; Hyperlipidemia; H/O hiatal hernia; GERD (gastroesophageal reflux disease); Anxiety; Sebaceous cyst; Pneumonia; Anemia; PAD (peripheral artery disease); Coronary artery disease; Cataract; Valvular disease; CHF (congestive heart failure); Depression; Arthritis; transfusion of packed red blood cells; and Myocardial infarction (2010). 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 At the urology clinic patient was found to have hyperglycemia with blood glucose more than 800 and patient was sent to the emergency room for further evaluation. In the ED patient was found to have hyperglycemia, anion gap 13. Patient is mentally alert oriented 3, asymptomatic. He denies chest pain no shortness of breath, no nausea vomiting or diarrhea. Denies any fever. White count was normal. Patient's bowels blood pressure was elevated in the ED. [facility] hospital[facility] called  to admit the patient for hyperglycemia and end-stage renal disease. Patient is on hemodialysis on Tuesday Thursday and Saturday, his last semen analysis was in [place]  Allergies:   Allergies  Allergen Reactions  . Amoxicillin-Pot Clavulanate Nausea And Vomiting  . Hydromorphone Hcl     "body started down"  08/22/14 - pt states no longer has problems with this  . Rifampin Nausea And Vomiting      Past Medical History  Diagnosis Date  . End stage renal disease on dialysis     LUE fistula  . Type I diabetes mellitus     a. 03/2014 admitted with HNK to [facility].  . Diabetic neuropathy     severe, s/p multiple toe amputation  . Hypothyroidism   . Hypertension   . Hyperlipidemia   . H/O hiatal hernia   . GERD  (gastroesophageal reflux disease)   . Anxiety   . Sebaceous cyst     side of neck  . Pneumonia     2010  . Anemia     a. req PRBC's 2011.  [name] PAD (peripheral artery disease)     a. s/p amputation of toes on the right;  b. left LE claudication.  . Coronary artery disease     a. s/p MI;  b. 10/2009 CABG x 3 @ [facility]: LIMA->LAD, VG->OM3, VG->RPDA; c. 11/2010 Cath 3/3 patent grafts;  d 12/2012 Cath: LM 30d, LAD 85p, D1 70, D2 90, LCX 40ost, OM2 100, RCA 90p, 164m, L->LAD ok, VG->OM3 ok, VG->RPDA 30, EF 50%->Med Rx.  . Cataract     right  . Valvular disease     a. 11/2012 Echo: EF 55-60%, mild LVH, mild MR, mild bi-atrial enlargement, mild-mod TR, PASP .  . CHF (congestive heart failure)   . Depression   . Arthritis     rheumatoid arthritis   . Hx of transfusion of packed red blood cells   . Myocardial infarction 2010    Past Surgical History  Procedure Laterality Date  . Dialysis fistula creation      left upper arm fistula  . Amputation      TOES ON BOTH FEET  . Abscess drainage  Behind right ear/occipital scalp  . Skin graft    . Eye surgery  1999  . Coronary artery bypass graft  10/2009    [facility] (Dr. Katrinka Blazing)  . Foot amputation    . Cardiac catheterization    . Cardiac catheterization  2/14  [facility]: severe 3 vessel CAD with patent grafts, RHC: moderately elevated PCW and pulmonary hypertension  . Penile prosthesis implant N/A 07/24/2014    Procedure: SALINE PENILE INJECTION WITH DISSECTION OF CORPORA ,  IMPLANTATION OF COLOPLAST PENILE PROTHESIS INFLATABLE;  Surgeon: [name], MD;  Location: [facility];  Service: Urology;  Laterality: N/A;  . Removal of penile prosthesis N/A 08/22/2014    Procedure: EXPLANT OF INFECTED  PENILE PROSTHESIS;  Surgeon: [name], MD;  Location: [facility];  Service: Urology;  Laterality: N/A;    Prior to Admission medications   Medication Sig Start Date End Date Taking? Authorizing Provider  acetaminophen (TYLENOL) 500 MG tablet  Take 1,000 mg by mouth every 6 (six) hours as needed (pain).   Yes Historical Provider, MD  amitriptyline (ELAVIL) 25 MG tablet Take 1 tablet (25 mg total) by mouth at bedtime. 08/21/14  Yes Dianne Dun, MD  aspirin 81 MG tablet Take 81 mg by mouth every morning.    Yes Historical Provider, MD  carvedilol (COREG) 25 MG tablet Take 25 mg by mouth 2 (two) times daily with a meal.   Yes Historical Provider, MD  cinacalcet (SENSIPAR) 60 MG tablet Take 60 mg by mouth at bedtime. With biggest meal 02/16/11  Yes Dianne Dun, MD  citalopram (CELEXA) 20 MG tablet TAKE 1 BY MOUTH DAILY Patient taking differently: TAKE 1 BY MOUTH NIGHTLY AT BEDTIME. 09/27/14  Yes Dianne Dun, MD  cloNIDine (CATAPRES) 0.1 MG tablet Take 0.1 mg by mouth 2 (two) times daily.   Yes Historical Provider, MD  furosemide (LASIX) 80 MG tablet Take 80 mg by mouth 2 (two) times daily.   Yes Historical Provider, MD  insulin glargine (LANTUS) 100 UNIT/ML injection Inject 25 Units into the skin at bedtime.    Yes Historical Provider, MD  insulin lispro (HUMALOG) 100 UNIT/ML injection Inject 12 Units into the skin 3 (three) times daily with meals. For every 50 over 200 add 1 unit. 04/26/14  Yes Carlus Pavlov, MD  isosorbide mononitrate (IMDUR) 30 MG 24 hr tablet Take 30 mg by mouth 2 (two) times daily.   Yes Historical Provider, MD  levothyroxine (SYNTHROID, LEVOTHROID) 75 MCG tablet Take 1 tablet (75 mcg total) by mouth daily before breakfast. 07/26/13  Yes Dianne Dun, MD  LYRICA 150 MG capsule TAKE 1 CAPSULE BY MOUTH EVERY MORNING AND 2 CAPSULE EVERY EVENING 10/01/14  Yes Dianne Dun, MD  metolazone (ZAROXOLYN) 5 MG tablet Take 5 mg by mouth every morning.    Yes Historical Provider, MD  Nutritional Supplements (FEEDING SUPPLEMENT, NEPRO CARB STEADY,) LIQD Take 237 mLs by mouth as needed (missed meal during dialysis.). 08/24/14  Yes Richarda Overlie, MD  nystatin-triamcinolone (MYCOLOG II) cream Apply 1 application topically every 8  (eight) hours as needed (rash).    Yes Historical Provider, MD  Omega-3 Fatty Acids (OMEGA 3 PO) Take 1,000 mg by mouth daily.   Yes Historical Provider, MD  omeprazole (PRILOSEC) 40 MG capsule Take 40 mg by mouth every morning.    Yes Historical Provider, MD  PARoxetine (PAXIL) 20 MG tablet Take 20 mg by mouth every morning.    Yes Historical Provider, MD  rosuvastatin (CRESTOR) 20 MG tablet Take 1 tablet (20 mg total) by mouth at bedtime. 12/13/12  Yes Dianne Dun, MD  saccharomyces boulardii (FLORASTOR) 250 MG capsule Take 1 capsule (250 mg total) by mouth 2 (two) times daily. 08/27/14  Yes Richarda Overlie, MD  sevelamer  carbonate (RENVELA) 800 MG tablet Take 800 mg by mouth 3 (three) times daily with meals.   Yes Historical Provider, MD    Social History:  reports that he has never smoked. He quit smokeless tobacco use about 19 years ago. His smokeless tobacco use included Chew. He reports that he drinks alcohol. He reports that he does not use illicit drugs.  Family History  Problem Relation Age of Onset  . Heart disease Other   . Hypertension Other   . Hypertension Mother   . Heart disease Mother   . Diabetes Mother   . Birth defects Paternal Uncle     unaware  . Birth defects Paternal Grandmother     breast     All the positives are listed in BOLD  Review of Systems:  HEENT: Headache, blurred vision, runny nose, sore throat Neck: Hypothyroidism, hyperthyroidism,,lymphadenopathy Chest : Shortness of breath, history of COPD, Asthma Heart : Chest pain, history of coronary arterey disease GI:  Nausea, vomiting, diarrhea, constipation, GERD GU: Dysuria, urgency, frequency of urination, hematuria Neuro: Stroke, seizures, syncope Psych: Depression, anxiety, hallucinations   Physical Exam: Pulse 99, resp. rate 15, SpO2 100 %. Constitutional:   Patient is a well-developed and well-nourished male in no acute distress and cooperative with exam. Head: Normocephalic and  atraumatic Mouth: Mucus membranes moist Eyes: PERRL, EOMI, conjunctivae normal Neck: Supple, No Thyromegaly Cardiovascular: RRR, S1 normal, S2 normal Pulmonary/Chest: CTAB, no wheezes, rales, or rhonchi Abdominal: Soft. Non-tender, non-distended, bowel sounds are normal, no masses, organomegaly, or guarding present.  Neurological: A&O x3, Strength is normal and symmetric bilaterally, cranial nerve II-XII are grossly intact, no focal motor deficit, sensory intact to light touch bilaterally.  Extremities : No Cyanosis, Clubbing or Edema  Labs on Admission:  Basic Metabolic Panel:  Recent Labs Lab 12/10/14 0635 12/10/14 0950  NA 120* 123*  K 3.8 4.2  CL 85* 88*  CO2 22 22  GLUCOSE 802* 785*  BUN 60* 59*  CREATININE 4.81* 4.69*  CALCIUM 9.1 9.1   Liver Function Tests:  Recent Labs Lab 12/10/14 0950  AST 24  ALT 25  ALKPHOS 299*  BILITOT 1.0  PROT 7.4  ALBUMIN 3.1*   No results for input(s): LIPASE, AMYLASE in the last 168 hours. No results for input(s): AMMONIA in the last 168 hours. CBC:  Recent Labs Lab 12/10/14 0635 12/10/14 0950  WBC 6.5 7.3  NEUTROABS  --  6.1  HGB 11.6* 11.8*  HCT 37.0* 36.8*  MCV 90.2 90.6  PLT 104* 100*   Cardiac Enzymes: No results for input(s): CKTOTAL, CKMB, CKMBINDEX, TROPONINI in the last 168 hours.  BNP (last 3 results)  Recent Labs  03/26/14 1555  PROBNP 1212.0*   CBG:  Recent Labs Lab 12/10/14 0617 12/10/14 0929 12/10/14 1141  GLUCAP >600* >600* >600*    Radiological Exams on Admission: No results found.  EKG: Independently reviewed. *Normal sinus rhythm   Assessment/Plan Active Problems:   Hyperglycemia  Hyperglycemia Patient has been started on glucose stabilizer, patient is asymptomatic at this time and blood sugar is slowly coming down. We will continue the patient on glucose stabilizer. Patient's mental status is very clear. Will start gentle IV hydration as patient is on hemodialysis. We'll start  normal saline at 50 mL per hour.  End-stage renal disease Patient is a hematemesis Tuesday Thursday and Saturday in [place]. Last hemodialysis was on Saturday. Patient has been taking Lasix and metolazone at home which I'm going to hold at this time. Please  consult nephrology in a.m. for hemodialysis  Penile infection status post prosthesis Patient is supposed to have bilateral corporotomy, drain placement as per urology, patient has been taking Levaquin 500 every other day for 10 days as per the note from urology resident. Tried to call urologist Dr. Patsi Sears, unable to contact him at this time to discuss whether IV antibiotics should be started or wait till  the surgery tonight. Patient does not appear septic, has normal white count has been afebrile. Will order blood cultures 2 Will await urology recommendations for starting antibiotics.  Hyponatremia Patient sodium when he came into the hospital was 120, and now has improved to 123, after blood glucose was down. Patient likely has pseudohyponatremia from severe hyperglycemia. We will check BMP every 8 hours, hopefully sodium will start improving once blood glucose improves with insulin  Hypertension Patient's blood pressure is elevated, he takes multiple antihypertensive medications at home. Will continue all the home medications.  Depression Continue Celexa, Paxil  DVT prophylaxis Heparin  Patient will be transferred to [facility], as he would require dialysis in a.m. Called and discussed with the accepting physician Dr. Ramiro Harvest  Discussed with Dr Patsi Sears, he recommends to start IV Primaxin for the penile infection. Will order Primaxin per pharmacy consultation.  Code status: Full code  Family discussion: No family at bedside   Time Spent on Admission: 60 minutes  Suzette Flagler S [facility] Pager: [phone] 12/10/2014, 12:08 PM  If 7PM-7AM, please contact night-coverage  [email or web address]   Password [email or web address]

## 2014-12-10 NOTE — Consult Note (Signed)
Reason for consult: penile infection  A  History of Present Illness    43 YO diabetic male presents today for bilateral corporotomy and penrose drain placement. Unfortunately, his blood sugars are 800, preventing safe anesthesia. He has been taking Levaquin 500mg  QOD x 10 days. He is s/p removal of IPP by Dr. on 08/23/14. He was last seen on 08/22/14 after he completed ABX 48 hrs ago. SMZ-TMP DS 1 po BID X 10 days. Stated that he continued to have significant penile pain/swelling. Blood sugars had been extremely high which is typical with infections.   He is complaining of pain in his penis, burning with voiding ( dialysis pt), and frequency.     S/P Coloplast IPP 07/24/14.   Past Medical History Problems  1. History of Acute Myocardial Infarction 2. History of Acute Myocardial Infarction 3. History of Anxiety (F41.9) 4. History of Coronary Artery Disease 5. History of End stage renal disease (N18.6) 6. History of congestive heart disease (Z86.79) 7. History of depression (Z86.59) 8. History of diabetes mellitus (Z86.39) 9. History of esophageal reflux (Z87.19) 10. History of hypercholesterolemia (Z86.39) 11. History of hypertension (Z86.79) 12. History of hypothyroidism (Z86.39) 13. History of peripheral neuropathy (Z86.69) 14. History of Murmur (R01.1)  Surgical History Problems  1. History of CABG (CABG) 2. History of Cardiac Catheter His Ablation 3. History of Dermatological Surgery Skin Graft 4. History of Dialysis 5. History of Dialysis Shunt 6. History of Eye Surgery 7. History of Incision And Drainage Of Skin Abscess 8. History of Orchiectomy Right 9. History of Surg Penis Insertion Of Penile Prosthesis 10. History of Surg Penis Removal Of Inflatable Penile Prosthesis 11. History of Surgery Foot Amputation 12. History of Surgery Foot Amputation Metatarsal And Toe  Current Meds 1. Amitriptyline HCl - 25 MG Oral Tablet;  Therapy: (Recorded:01Oct2014) to  Recorded 2. Aspirin 325 MG Oral Tablet;  Therapy: (Recorded:01Oct2014) to Recorded 3. Carvedilol 25 MG Oral Tablet;  Therapy: (Recorded:01Oct2014) to Recorded 4. Crestor 20 MG Oral Tablet;  Therapy: (Recorded:01Oct2014) to Recorded 5. Furosemide 80 MG Oral Tablet;  Therapy: (Recorded:01Oct2014) to Recorded 6. HumaLOG SOLN;  Therapy: (Recorded:01Oct2014) to Recorded 7. HYDROmorphone HCl - 2 MG Oral Tablet; TAKE 1 TABLET EVERY 4 TO 6 HOURS AS  NEEDED;  Therapy: 28Sep2015 to (Evaluate:03Oct2015); Last Rx:28Sep2015 Ordered 8. Isosorbide Mononitrate ER 30 MG Oral Tablet Extended Release 24 Hour;  Therapy: (Recorded:01Oct2014) to Recorded 9. Lantus SOLN;  Therapy: (Recorded:01Oct2014) to Recorded 10. Levofloxacin 500 MG Oral Tablet; TAKE 1 TABLET EVERY OTHER DAY;   Therapy: 13Jan2016 to (Evaluate:02Feb2016)  Requested for: 13Jan2016; Last   Rx:13Jan2016 Ordered 11. Lyrica 150 MG Oral Capsule;   Therapy: (Recorded:01Oct2014) to Recorded 12. NovoLOG SOLN;   Therapy: (Recorded:01Oct2014) to Recorded 13. Omega-3 CAPS;   Therapy: (Recorded:01Oct2014) to Recorded 14. PriLOSEC 40 MG Oral Capsule Delayed Release;   Therapy: (Recorded:01Oct2014) to Recorded 15. Sensipar 60 MG Oral Tablet;   Therapy: (Recorded:01Oct2014) to Recorded 16. Synthroid 75 MCG Oral Tablet;   Therapy: (Recorded:01Oct2014) to Recorded  Allergies Medication  1. Amoxicillin TABS 2. Rifampin CAPS  Family History Problems  1. Family history of Diabetes Mellitus : Mother 2. Family history of Diabetes Mellitus : Father 3. Family history of Family Health Status - Father's Age 47. Family history of Family Health Status - Mother's Age 74. Denied: Family history of Family Health Status Number Of Children 6. Family history of Heart Disease : Mother 7. Family history of Hypertension : Mother  Social History Problems  1. Alcohol Use  occasional beer 2. Caffeine Use   8 per day 3. Currently On Disability 4. Marital  History - Single 5. Never A Smoker  Review of Systems Genitourinary, constitutional, skin, eye, otolaryngeal, hematologic/lymphatic, cardiovascular, pulmonary, endocrine, musculoskeletal, gastrointestinal, neurological and psychiatric system(s) were reviewed and pertinent findings if present are noted and are otherwise negative.  Genitourinary: urinary frequency, feelings of urinary urgency, nocturia and erectile dysfunction, but urine stream is not weak, urinary stream does not start and stop, no incomplete emptying of bladder, no hematuria, no urethral discharge, urine is not foul-smelling, no penile pain, no penile curvature, no penile erythema, no testicular pain, no testicular mass, no scrotal pain, no scrotal lesion, no perineal pain and initiating urination does not require straining.  Gastrointestinal: no nausea, no vomiting, no flank pain, no abdominal pain, no heartburn, no diarrhea and no constipation.  Constitutional: no fever, no night sweats and not feeling tired (fatigue).  Integumentary: no new skin rashes or lesions and no pruritus.  Eyes: no blurred vision and no diplopia.  ENT: no sore throat and no sinus problems.  Hematologic/Lymphatic: a tendency to easily bruise, but no swollen glands.  Cardiovascular: leg swelling, but no chest pain.  Respiratory: no shortness of breath and no cough.  Endocrine: no polydipsia.  Musculoskeletal: no back pain and no joint pain.  Neurological: difficulty walking, but no headache and no dizziness.  Psychiatric: depression, but no anxiety.    Vitals Vital Signs [Data Includes: Last 1 Day]  Recorded: 27Jan2016 10:01AM  Blood Pressure: 190 / 96 Temperature: 98.3 F Heart Rate: 96  Physical Exam Constitutional:. No acute distress. The patient appears well hydrated. Thin, sallow male in NAD.  ENT:. The ears and nose are normal in appearance. Examination of the teeth show poor dentition. The odor of the patient's breath is uremic.  Neck:.  There is no jugular-venous distention.  Pulmonary: No respiratory distress . No accessory muscle use.  Cardiovascular:. No peripheral edema.  Abdomen: The abdomen is flat and not distended. Bowel sounds are normal. No hernias are palpable.  Genitourinary: The penis is uncircumcised.  Lymphatics: The femoral nodes are not enlarged or tender.  Skin: Normal skin turgor, no visible rash and no visible skin lesions.  Neuro/Psych:. Mood and affect are appropriate.    Assessment Assessed  1. Abscess of scrotum (N49.2) 2. Diabetic peripheral neuropathy (E11.42) 3. Wound infection (T14.8,L08.9)  Mr. Lurz is a 44 year old Diabetic ESRD male, status post Coloplast three-piece inflatable prosthesis, removed because of anaerobic infection 1 month after placement. The patient needs to have drainage from the left glans, and I think he needs to have repeat corporal irrigation and drainage. He is dialyzed 3 times a week on Tuesday, Thursday, Saturday, in [place].   Needs medical optimization prior to surgical procedure, hopefully later this week. Afterwards, can discharge home with penrose drains in place. Will evaluate the OR schedule after touching base with our medical colleagues and try to coordinate with dialysis.  Seen with Dr. Patsi Sears

## 2014-12-10 NOTE — Progress Notes (Addendum)
CRITICAL VALUE ALERT  Critical value received: CBG HI / Serum Glucos 802  Date of notification: 12/10/14  Time of notification: 0735  Critical value read back: yes  Nurse who received alert:  Concepcion Living RN   MD notified (1st page): Acey Lav  Time of first page: 0935  MD notified (2nd page):  Time of second page:  Responding MD:    Time MD responded:

## 2014-12-10 NOTE — Progress Notes (Signed)
Report given to [facility] RN in ER. Patient transferred via W/C.

## 2014-12-10 NOTE — ED Notes (Signed)
Per Short stay RN/Dr. Hart Robinsons was suppose to have surgery for infected penis-had to cancel because of high blood sugar-in the 800"s-patient is a dialysis patient (T/TH/SAT)

## 2014-12-10 NOTE — Progress Notes (Signed)
Informed surgery, Taylor [name], that CBG was HI and am waiting for serum glucose results

## 2014-12-10 NOTE — Anesthesia Preprocedure Evaluation (Signed)
Anesthesia Evaluation  Patient identified by MRN, date of birth, ID band Patient awake    Reviewed: Allergy & Precautions, H&P , NPO status , Patient's Chart, lab work & pertinent test results  Airway Mallampati: III  TM Distance: >3 FB Neck ROM: Full    Dental  (+) Edentulous Upper, Upper Dentures   Pulmonary shortness of breath, pneumonia -,  breath sounds clear to auscultation  Pulmonary exam normal       Cardiovascular hypertension, Pt. on medications and Pt. on home beta blockers + CAD, + Past MI, + CABG, + Peripheral Vascular Disease and +CHF Rhythm:Regular Rate:Normal  AV fistula LUE   Neuro/Psych PSYCHIATRIC DISORDERS Anxiety Depression Diabetic Neuropathy Diabetic Retinopathy  Neuromuscular disease    GI/Hepatic Neg liver ROS, hiatal hernia, GERD-  Medicated and Controlled,  Endo/Other  diabetes, Poorly Controlled, Type 1, Insulin DependentHypothyroidism   Renal/GU CRF and DialysisRenal disease   Infected penile prosthesis    Musculoskeletal  (+) Arthritis -, Osteoarthritis,    Abdominal Normal abdominal exam  (+)   Peds  Hematology  (+) anemia ,   Anesthesia Other Findings   Reproductive/Obstetrics                             Anesthesia Physical  Anesthesia Plan  ASA: IV and emergent  Anesthesia Plan: General   Post-op Pain Management:    Induction: Intravenous, Rapid sequence and Cricoid pressure planned  Airway Management Planned: Oral ETT  Additional Equipment:   Intra-op Plan:   Post-operative Plan: Extubation in OR  Informed Consent: I have reviewed the patients History and Physical, chart, labs and discussed the procedure including the risks, benefits and alternatives for the proposed anesthesia with the patient or authorized representative who has indicated his/her understanding and acceptance.   Dental advisory given  Plan Discussed with: CRNA,  Anesthesiologist and Surgeon  Anesthesia Plan Comments:         Anesthesia Quick Evaluation

## 2014-12-10 NOTE — Progress Notes (Signed)
ANTIBIOTIC CONSULT NOTE - INITIAL  Pharmacy Consult for Primaxin Indication: Intra-abdominal infection  Allergies  Allergen Reactions  . Amoxicillin-Pot Clavulanate Nausea And Vomiting  . Hydromorphone Hcl     "body started down"  08/22/14 - pt states no longer has problems with this  . Rifampin Nausea And Vomiting   Patient Measurements:   Total body weight: 80.7 kg  Vital Signs: Temp: 97.6 F (36.4 C) (02/01 0557) Temp Source: Oral (02/01 0557) BP: 183/91 mmHg (02/01 0837) Pulse Rate: 99 (02/01 1100) Intake/Output from previous day:   Intake/Output from this shift:    Labs:  Recent Labs  12/10/14 0635 12/10/14 0950  WBC 6.5 7.3  HGB 11.6* 11.8*  PLT 104* 100*  CREATININE 4.81* 4.69*   Estimated Creatinine Clearance: 22.7 mL/min (by C-G formula based on Cr of 4.69). No results for input(s): VANCOTROUGH, VANCOPEAK, VANCORANDOM, GENTTROUGH, GENTPEAK, GENTRANDOM, TOBRATROUGH, TOBRAPEAK, TOBRARND, AMIKACINPEAK, AMIKACINTROU, AMIKACIN in the last 72 hours.   Microbiology: No results found for this or any previous visit (from the past 720 hour(s)).  Medical History: Past Medical History  Diagnosis Date  . End stage renal disease on dialysis     LUE fistula  . Type I diabetes mellitus     a. 03/2014 admitted with HNK to [facility].  . Diabetic neuropathy     severe, s/p multiple toe amputation  . Hypothyroidism   . Hypertension   . Hyperlipidemia   . H/O hiatal hernia   . GERD (gastroesophageal reflux disease)   . Anxiety   . Sebaceous cyst     side of neck  . Pneumonia     2010  . Anemia     a. req PRBC's 2011.  [name] PAD (peripheral artery disease)     a. s/p amputation of toes on the right;  b. left LE claudication.  . Coronary artery disease     a. s/p MI;  b. 10/2009 CABG x 3 @ [facility]: LIMA->LAD, VG->OM3, VG->RPDA; c. 11/2010 Cath 3/3 patent grafts;  d 12/2012 Cath: LM 30d, LAD 85p, D1 70, D2 90, LCX 40ost, OM2 100, RCA 90p, 148m, L->LAD ok, VG->OM3 ok, VG->RPDA  30, EF 50%->Med Rx.  . Cataract     right  . Valvular disease     a. 11/2012 Echo: EF 55-60%, mild LVH, mild MR, mild bi-atrial enlargement, mild-mod TR, PASP .  . CHF (congestive heart failure)   . Depression   . Arthritis     rheumatoid arthritis   . Hx of transfusion of packed red blood cells   . Myocardial infarction 2010   Medications:  Scheduled:   Anti-infectives    Start     Dose/Rate Route Frequency Ordered Stop   12/10/14 1400  imipenem-cilastatin (PRIMAXIN) 250 mg in sodium chloride 0.9 % 100 mL IVPB     250 mg200 mL/hr over 30 Minutes Intravenous Every 6 hours 12/10/14 1312       Assessment: 43 yoM Type 1 DM, hx of infected penile prosthesis. Scheduled for bilateral corporotomy and Penrose drain placement for the anaerobic infection from inflatable penile prosthesis 2/1. At clinic, patient hyperglycemic, sent to ED, plan admission for abx and transfer to [facility] for dialysis (every Tues,Thurs,Saturday PTA).  Hx of ESRD on dialysis, Primaxin per pharmacy dosing, adjusted for renal function. Primaxin recommended by Urology MD.  Goal of Therapy:  Antibiotic dose/schedule appropriate for treatment, renal function  Plan:   Primaxin 250mg  IV q6hr  Plan transfer to [facility] for dialysis, Primaxin dosing scheduled around  dialysis per Pharmacy  Otho Bellows PharmD Pager [phone] 12/10/2014, 1:25 PM

## 2014-12-10 NOTE — Progress Notes (Signed)
Admission note:  Arrival Method: [facility] stretcher Mental Orientation: alert & oriented x 4 Telemetry: not ordred Assessment: completed  Skin: old scabbing to BLE; swelling and drainage of penis  IV: Right FA and Right upper arm  Pain: pt denies  Tubes: N/A Safety Measures: Patient Handbook has been given, and discussed the Fall Prevention worksheet. Left at bedside  Admission: Completed and admission orders have been written  6E Orientation: Patient has been oriented to the unit, staff and to the room.

## 2014-12-10 NOTE — Progress Notes (Signed)
Patient kept informed .

## 2014-12-10 NOTE — ED Notes (Signed)
MD at bedside. 

## 2014-12-11 DIAGNOSIS — E871 Hypo-osmolality and hyponatremia: Secondary | ICD-10-CM

## 2014-12-11 DIAGNOSIS — G990 Autonomic neuropathy in diseases classified elsewhere: Secondary | ICD-10-CM

## 2014-12-11 DIAGNOSIS — N3000 Acute cystitis without hematuria: Secondary | ICD-10-CM

## 2014-12-11 DIAGNOSIS — N390 Urinary tract infection, site not specified: Secondary | ICD-10-CM | POA: Diagnosis present

## 2014-12-11 DIAGNOSIS — E1143 Type 2 diabetes mellitus with diabetic autonomic (poly)neuropathy: Secondary | ICD-10-CM

## 2014-12-11 DIAGNOSIS — E039 Hypothyroidism, unspecified: Secondary | ICD-10-CM

## 2014-12-11 DIAGNOSIS — N186 End stage renal disease: Secondary | ICD-10-CM

## 2014-12-11 DIAGNOSIS — N492 Inflammatory disorders of scrotum: Secondary | ICD-10-CM

## 2014-12-11 LAB — MRSA PCR SCREENING: MRSA by PCR: NEGATIVE

## 2014-12-11 LAB — COMPREHENSIVE METABOLIC PANEL
ALK PHOS: 246 U/L — AB (ref 39–117)
ALT: 19 U/L (ref 0–53)
ANION GAP: 9 (ref 5–15)
AST: 24 U/L (ref 0–37)
Albumin: 2.4 g/dL — ABNORMAL LOW (ref 3.5–5.2)
BUN: 59 mg/dL — AB (ref 6–23)
CHLORIDE: 94 mmol/L — AB (ref 96–112)
CO2: 24 mmol/L (ref 19–32)
Calcium: 8.5 mg/dL (ref 8.4–10.5)
Creatinine, Ser: 5.18 mg/dL — ABNORMAL HIGH (ref 0.50–1.35)
GFR calc Af Amer: 14 mL/min — ABNORMAL LOW (ref >90)
GFR calc non Af Amer: 12 mL/min — ABNORMAL LOW (ref >90)
Glucose, Bld: 213 mg/dL — ABNORMAL HIGH (ref 70–99)
Potassium: 3.9 mmol/L (ref 3.5–5.1)
Sodium: 127 mmol/L — ABNORMAL LOW (ref 135–145)
Total Bilirubin: 0.9 mg/dL (ref 0.3–1.2)
Total Protein: 5.8 g/dL — ABNORMAL LOW (ref 6.0–8.3)

## 2014-12-11 LAB — BASIC METABOLIC PANEL
Anion gap: 8 (ref 5–15)
BUN: 37 mg/dL — ABNORMAL HIGH (ref 6–23)
CHLORIDE: 96 mmol/L (ref 96–112)
CO2: 24 mmol/L (ref 19–32)
CREATININE: 3.62 mg/dL — AB (ref 0.50–1.35)
Calcium: 8.6 mg/dL (ref 8.4–10.5)
GFR calc Af Amer: 22 mL/min — ABNORMAL LOW (ref >90)
GFR calc non Af Amer: 19 mL/min — ABNORMAL LOW (ref >90)
Glucose, Bld: 290 mg/dL — ABNORMAL HIGH (ref 70–99)
Potassium: 3.2 mmol/L — ABNORMAL LOW (ref 3.5–5.1)
Sodium: 128 mmol/L — ABNORMAL LOW (ref 135–145)

## 2014-12-11 LAB — GLUCOSE, CAPILLARY
GLUCOSE-CAPILLARY: 250 mg/dL — AB (ref 70–99)
GLUCOSE-CAPILLARY: 269 mg/dL — AB (ref 70–99)
Glucose-Capillary: 188 mg/dL — ABNORMAL HIGH (ref 70–99)
Glucose-Capillary: 231 mg/dL — ABNORMAL HIGH (ref 70–99)
Glucose-Capillary: 385 mg/dL — ABNORMAL HIGH (ref 70–99)

## 2014-12-11 LAB — CBC
HEMATOCRIT: 30.5 % — AB (ref 39.0–52.0)
Hemoglobin: 10.1 g/dL — ABNORMAL LOW (ref 13.0–17.0)
MCH: 28.1 pg (ref 26.0–34.0)
MCHC: 33.1 g/dL (ref 30.0–36.0)
MCV: 84.7 fL (ref 78.0–100.0)
PLATELETS: 98 10*3/uL — AB (ref 150–400)
RBC: 3.6 MIL/uL — ABNORMAL LOW (ref 4.22–5.81)
RDW: 14.5 % (ref 11.5–15.5)
WBC: 5.4 10*3/uL (ref 4.0–10.5)

## 2014-12-11 LAB — HEPATITIS B SURFACE ANTIGEN: Hepatitis B Surface Ag: NEGATIVE

## 2014-12-11 LAB — PHOSPHORUS: PHOSPHORUS: 3.6 mg/dL (ref 2.3–4.6)

## 2014-12-11 MED ORDER — DOXERCALCIFEROL 4 MCG/2ML IV SOLN
1.5000 ug | INTRAVENOUS | Status: DC
  Administered 2014-12-11 – 2014-12-20 (×5): 1.5 ug via INTRAVENOUS
  Filled 2014-12-11 (×5): qty 2

## 2014-12-11 MED ORDER — INSULIN GLARGINE 100 UNIT/ML ~~LOC~~ SOLN
25.0000 [IU] | Freq: Every day | SUBCUTANEOUS | Status: DC
  Administered 2014-12-11: 25 [IU] via SUBCUTANEOUS
  Filled 2014-12-11 (×2): qty 0.25

## 2014-12-11 MED ORDER — DARBEPOETIN ALFA 40 MCG/0.4ML IJ SOSY
40.0000 ug | PREFILLED_SYRINGE | INTRAMUSCULAR | Status: DC
  Administered 2014-12-18: 40 ug via INTRAVENOUS
  Filled 2014-12-11: qty 0.4

## 2014-12-11 MED ORDER — INSULIN ASPART 100 UNIT/ML ~~LOC~~ SOLN
5.0000 [IU] | Freq: Once | SUBCUTANEOUS | Status: AC
  Administered 2014-12-11: 5 [IU] via SUBCUTANEOUS

## 2014-12-11 MED ORDER — INSULIN ASPART 100 UNIT/ML ~~LOC~~ SOLN
5.0000 [IU] | Freq: Three times a day (TID) | SUBCUTANEOUS | Status: DC
  Administered 2014-12-11 – 2014-12-17 (×13): 5 [IU] via SUBCUTANEOUS

## 2014-12-11 MED ORDER — DOXERCALCIFEROL 4 MCG/2ML IV SOLN
INTRAVENOUS | Status: AC
  Filled 2014-12-11: qty 2

## 2014-12-11 MED ORDER — SEVELAMER CARBONATE 800 MG PO TABS
1600.0000 mg | ORAL_TABLET | Freq: Three times a day (TID) | ORAL | Status: DC
  Administered 2014-12-11 – 2014-12-21 (×27): 1600 mg via ORAL
  Filled 2014-12-11 (×32): qty 2

## 2014-12-11 NOTE — Consult Note (Signed)
Taylor [name] is an 44 y.o. male referred by Dr Janee Morn   Chief Complaint: ESRD, Anemia, Sec HPTH HPI: 43yo WM with ESRD sec DM1 admitted yest for infected penis/scrotum.  On HD in [place] TTS.  LAst HD was Sat.  TTS, 3.5hr, LUA AVG, BFR 450, DW 77.5KG.  1.58mcg hectorol, epo 4000u  Past Medical History  Diagnosis Date  . End stage renal disease on dialysis     LUE fistula  . Type I diabetes mellitus     a. 03/2014 admitted with HNK to [facility].  . Diabetic neuropathy     severe, s/p multiple toe amputation  . Hypothyroidism   . Hypertension   . Hyperlipidemia   . H/O hiatal hernia   . GERD (gastroesophageal reflux disease)   . Anxiety   . Sebaceous cyst     side of neck  . Pneumonia     2010  . Anemia     a. req PRBC's 2011.  [name] PAD (peripheral artery disease)     a. s/p amputation of toes on the right;  b. left LE claudication.  . Coronary artery disease     a. s/p MI;  b. 10/2009 CABG x 3 @ [facility]: LIMA->LAD, VG->OM3, VG->RPDA; c. 11/2010 Cath 3/3 patent grafts;  d 12/2012 Cath: LM 30d, LAD 85p, D1 70, D2 90, LCX 40ost, OM2 100, RCA 90p, 148m, L->LAD ok, VG->OM3 ok, VG->RPDA 30, EF 50%->Med Rx.  . Cataract     right  . Valvular disease     a. 11/2012 Echo: EF 55-60%, mild LVH, mild MR, mild bi-atrial enlargement, mild-mod TR, PASP .  . CHF (congestive heart failure)   . Depression   . Arthritis     rheumatoid arthritis   . Hx of transfusion of packed red blood cells   . Myocardial infarction 2010    Past Surgical History  Procedure Laterality Date  . Dialysis fistula creation      left upper arm fistula  . Amputation      TOES ON BOTH FEET  . Abscess drainage  Behind right ear/occipital scalp  . Skin graft    . Eye surgery  1999  . Coronary artery bypass graft  10/2009    [facility] (Dr. Katrinka Blazing)  . Foot amputation    . Cardiac catheterization    . Cardiac catheterization  2/14    [facility]: severe 3 vessel CAD with patent grafts, RHC: moderately elevated PCW and  pulmonary hypertension  . Penile prosthesis implant N/A 07/24/2014    Procedure: SALINE PENILE INJECTION WITH DISSECTION OF CORPORA ,  IMPLANTATION OF COLOPLAST PENILE PROTHESIS INFLATABLE;  Surgeon: [name], MD;  Location: [facility] ORS;  Service: Urology;  Laterality: N/A;  . Removal of penile prosthesis N/A 08/22/2014    Procedure: EXPLANT OF INFECTED  PENILE PROSTHESIS;  Surgeon: [name], MD;  Location: [facility] ORS;  Service: Urology;  Laterality: N/A;    Family History  Problem Relation Age of Onset  . Heart disease Other   . Hypertension Other   . Hypertension Mother   . Heart disease Mother   . Diabetes Mother   . Birth defects Paternal Uncle     unaware  . Birth defects Paternal Grandmother     breast   Social History:  reports that he has never smoked. He quit smokeless tobacco use about 19 years ago. His smokeless tobacco use included Chew. He reports that he drinks alcohol. He reports that he does not use illicit  drugs.  Allergies:  Allergies  Allergen Reactions  . Amoxicillin-Pot Clavulanate Nausea And Vomiting  . Hydromorphone Hcl     "body started down"  08/22/14 - pt states no longer has problems with this  . Rifampin Nausea And Vomiting    Medications Prior to Admission  Medication Sig Dispense Refill  . acetaminophen (TYLENOL) 500 MG tablet Take 1,000 mg by mouth every 6 (six) hours as needed (pain).    [name] amitriptyline (ELAVIL) 25 MG tablet Take 1 tablet (25 mg total) by mouth at bedtime. 30 tablet 1  . aspirin 81 MG tablet Take 81 mg by mouth every morning.     . carvedilol (COREG) 25 MG tablet Take 25 mg by mouth 2 (two) times daily with a meal.    . cinacalcet (SENSIPAR) 60 MG tablet Take 60 mg by mouth at bedtime. With biggest meal    . citalopram (CELEXA) 20 MG tablet TAKE 1 BY MOUTH DAILY (Patient taking differently: TAKE 1 BY MOUTH NIGHTLY AT BEDTIME.) 30 tablet 2  . cloNIDine (CATAPRES) 0.1 MG tablet Take 0.1 mg by mouth 2 (two) times  daily.    . furosemide (LASIX) 80 MG tablet Take 80 mg by mouth 2 (two) times daily.    . insulin glargine (LANTUS) 100 UNIT/ML injection Inject 25 Units into the skin at bedtime.     . insulin lispro (HUMALOG) 100 UNIT/ML injection Inject 12 Units into the skin 3 (three) times daily with meals. For every 50 over 200 add 1 unit.    . isosorbide mononitrate (IMDUR) 30 MG 24 hr tablet Take 30 mg by mouth 2 (two) times daily.    [name] levothyroxine (SYNTHROID, LEVOTHROID) 75 MCG tablet Take 1 tablet (75 mcg total) by mouth daily before breakfast. 90 tablet 1  . LYRICA 150 MG capsule TAKE 1 CAPSULE BY MOUTH EVERY MORNING AND 2 CAPSULE EVERY EVENING 90 capsule 0  . metolazone (ZAROXOLYN) 5 MG tablet Take 5 mg by mouth every morning.     . Nutritional Supplements (FEEDING SUPPLEMENT, NEPRO CARB STEADY,) LIQD Take 237 mLs by mouth as needed (missed meal during dialysis.). 30 Can 0  . nystatin-triamcinolone (MYCOLOG II) cream Apply 1 application topically every 8 (eight) hours as needed (rash).     . Omega-3 Fatty Acids (OMEGA 3 PO) Take 1,000 mg by mouth daily.    [name] omeprazole (PRILOSEC) 40 MG capsule Take 40 mg by mouth every morning.     [name] PARoxetine (PAXIL) 20 MG tablet Take 20 mg by mouth every morning.     . rosuvastatin (CRESTOR) 20 MG tablet Take 1 tablet (20 mg total) by mouth at bedtime. 30 tablet 5  . saccharomyces boulardii (FLORASTOR) 250 MG capsule Take 1 capsule (250 mg total) by mouth 2 (two) times daily. 30 capsule 0  . sevelamer carbonate (RENVELA) 800 MG tablet Take 800 mg by mouth 3 (three) times daily with meals.       Lab Results: UA: > 300 prot, >1000 glucose, TNTC WBC's  21-50 RBC's   Recent Labs  12/10/14 0950 12/10/14 1927 12/11/14 0227  WBC 7.3 7.3 5.4  HGB 11.8* 11.3* 10.1*  HCT 36.8* 34.5* 30.5*  PLT 100* 114* 98*   BMET  Recent Labs  12/10/14 0950 12/10/14 1927 12/11/14 0227  NA 123* 131* 127*  K 4.2 4.2 3.9  CL 88* 94* 94*  CO2 22 25 24   GLUCOSE 785* 81  213*  BUN 59* 57* 59*  CREATININE 4.69* 4.96* 5.18*  CALCIUM 9.1 9.4 8.5   LFT  Recent Labs  12/11/14 0227  PROT 5.8*  ALBUMIN 2.4*  AST 24  ALT 19  ALKPHOS 246*  BILITOT 0.9   No results found.  ROS: No change in vision No SOB No CP No abd pain + Neuropathic pain hands and feet + edema  PHYSICAL EXAM: Blood pressure 122/74, pulse 86, temperature 98.8 F (37.1 C), temperature source Oral, resp. rate 18, height 6\' 3"  (1.905 m), weight 81.3 kg (179 lb 3.7 oz), SpO2 100 %. HEENT: PERRLA EOMI NECK:No JVD LUNGS:clear CARDIAC:RRR ABD:+ BS NTND EXT:tr edema  LUA AVG + bruit GU;  Open wound, draining on head of penis NEURO:CNI Ox3 no asterixis  Assessment: 1. Infected penis 2. ESRD 3. 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 Anemia on EPO 5. HTN 6. DM PLAN: 1. HD today 2. Increase renvela to 2 AC 3. Cont hectorol 1.71mcg q HD 4. Cont ESA with aranesp 5. He wants to eat and if surgery not planned please give him a renal diet, carb modified   Delman Goshorn T 12/11/2014, 9:45 AM

## 2014-12-11 NOTE — Progress Notes (Signed)
Inpatient Diabetes Program Recommendations  AACE/ADA: New Consensus Statement on Inpatient Glycemic Control (2013)  Target Ranges:  Prepandial:   less than 140 mg/dL      Peak postprandial:   less than 180 mg/dL (1-2 hours)      Critically ill patients:  140 - 180 mg/dL   Lantus not given last pm per MAR.  This coordinator called Dr. Janee Morn and he reordered to be given now. Will follow. Thank you  Piedad Climes BSN, RN,CDE Inpatient Diabetes Coordinator [phone] (team pager)

## 2014-12-11 NOTE — Procedures (Signed)
Pt seen on HD.  Ap 180 Vp 240  BFR 450.  Pulling 4 liters.  No CO.

## 2014-12-11 NOTE — Progress Notes (Signed)
[facility] PROGRESS NOTE  Taylor [name] ZWC:[id number] DOB: 03-13-1971 DOA: 12/10/2014 PCP: [name], MD  Assessment/Plan: #1 hyperosmolar non ketotic hyperglycemia Likely secondary to acute infection of scrotal abscess/wound infection and UTI. Hyperglycemia improved. Patient off glucose stabilizer. Blood cultures pending. Continue empiric IV antibiotics. Continue Lantus 25 units daily. Sliding scale insulin.  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 Continue IV Primaxin. Patient is post a have bilateral corporotomy, drain placement as per urology. Per urology.  #3 end-stage renal disease on hemodialysis Nephrology following. Patient currently hemodialysis.  #4 hyponatremia Likely multifactorial secondary to hyperglycemia and problem #3. Hyponatremia improving. Patient on hemodialysis today.  #5 urinary tract infection Check urine cultures. Patient on IV Primaxin.  #6 hypertension Continue home regimen of clonidine, Coreg, Imdur.  #7 hypothyroidism Continue Synthroid.  #8 depression Continue Paxil, Celexa, Lyrica.  #9 hypokalemia Patient hemodialysis. Per renal.  #10 prophylaxis Heparin for DVT prophylaxis.  Code Status: Full Family Communication: No family at bedside. Disposition Plan: Remain inpatient   Consultants:  Neurology: Dr. Patsi Sears 12/10/2014  Nephrology: Dr. Briant Cedar 12/11/2014  Procedures:  None  Antibiotics:  IV Primaxin 12/10/14  HPI/Subjective: Patient in HD sleeping  Objective: Filed Vitals:   12/11/14 1402  BP: 152/68  Pulse: 83  Temp: 97.9 F (36.6 C)  Resp: 20    Intake/Output Summary (Last 24 hours) at 12/11/14 1612 Last data filed at 12/11/14 1402  Gross per 24 hour  Intake 1257.5 ml  Output   4501 ml  Net -3243.5 ml   Filed Weights   12/10/14 1953 12/11/14 1028 12/11/14 1402  Weight: 81.3 kg (179 lb 3.7 oz) 81.2 kg (179 lb 0.2 oz) 77.2 kg (170  lb 3.1 oz)    Exam:   General:  NAD  Cardiovascular: RRR  Respiratory: CTAB anterior lung fields  Abdomen: Soft/NT/ND/+BS  Musculoskeletal: No c/c/e  Data Reviewed: Basic Metabolic Panel:  Recent Labs Lab 12/10/14 0635 12/10/14 0950 12/10/14 1927 12/11/14 0227 12/11/14 1032  NA 120* 123* 131* 127* 128*  K 3.8 4.2 4.2 3.9 3.2*  CL 85* 88* 94* 94* 96  CO2 22 22 25 24 24   GLUCOSE 802* 785* 81 213* 290*  BUN 60* 59* 57* 59* 37*  CREATININE 4.81* 4.69* 4.96* 5.18* 3.62*  CALCIUM 9.1 9.1 9.4 8.5 8.6  PHOS  --   --   --   --  3.6   Liver Function Tests:  Recent Labs Lab 12/10/14 0950 12/11/14 0227  AST 24 24  ALT 25 19  ALKPHOS 299* 246*  BILITOT 1.0 0.9  PROT 7.4 5.8*  ALBUMIN 3.1* 2.4*   No results for input(s): LIPASE, AMYLASE in the last 168 hours. No results for input(s): AMMONIA in the last 168 hours. CBC:  Recent Labs Lab 12/10/14 0635 12/10/14 0950 12/10/14 1927 12/11/14 0227  WBC 6.5 7.3 7.3 5.4  NEUTROABS  --  6.1  --   --   HGB 11.6* 11.8* 11.3* 10.1*  HCT 37.0* 36.8* 34.5* 30.5*  MCV 90.2 90.6 87.1 84.7  PLT 104* 100* 114* 98*   Cardiac Enzymes: No results for input(s): CKTOTAL, CKMB, CKMBINDEX, TROPONINI in the last 168 hours. BNP (last 3 results) No results for input(s): BNP in the last 8760 hours.  ProBNP (last 3 results)  Recent Labs  03/26/14 1555  PROBNP 1212.0*    CBG:  Recent Labs Lab 12/10/14 1840 12/10/14 2005 12/11/14 0001 12/11/14 0409 12/11/14 0810  GLUCAP 94 87 188* 231* 250*  Recent Results (from the past 240 hour(s))  MRSA PCR Screening     Status: None   Collection Time: 12/11/14  4:35 AM  Result Value Ref Range Status   MRSA by PCR NEGATIVE NEGATIVE Final    Comment:        The GeneXpert MRSA Assay (FDA approved for NASAL specimens only), is one component of a comprehensive MRSA colonization surveillance program. It is not intended to diagnose MRSA infection nor to guide or monitor  treatment for MRSA infections.      Studies: No results found.  Scheduled Meds: . amitriptyline  25 mg Oral QHS  . aspirin  81 mg Oral q morning - 10a  . carvedilol  25 mg Oral BID WC  . cinacalcet  60 mg Oral Q supper  . citalopram  20 mg Oral Daily  . cloNIDine  0.1 mg Oral BID  . [START ON 12/18/2014] Darbepoetin Alfa  40 mcg Intravenous Q7 days  . doxercalciferol      . doxercalciferol  1.5 mcg Intravenous Q T,Th,Sa-HD  . heparin  5,000 Units Subcutaneous 3 times per day  . imipenem-cilastatin  250 mg Intravenous Q6H  . insulin aspart  0-9 Units Subcutaneous TID WC  . insulin glargine  25 Units Subcutaneous Daily  . isosorbide mononitrate  30 mg Oral BID  . levothyroxine  75 mcg Oral QAC breakfast  . PARoxetine  20 mg Oral q morning - 10a  . pregabalin  150 mg Oral BID  . rosuvastatin  20 mg Oral QHS  . 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 If 7PM-7AM, please contact night-coverage at [email or web address], password [facility] 12/11/2014, 4:12 PM  LOS: 1 day

## 2014-12-11 NOTE — Progress Notes (Signed)
UR Completed.  [phone]  

## 2014-12-12 ENCOUNTER — Other Ambulatory Visit: Payer: Self-pay | Admitting: Urology

## 2014-12-12 DIAGNOSIS — E7251 Non-ketotic hyperglycinemia: Secondary | ICD-10-CM

## 2014-12-12 LAB — RENAL FUNCTION PANEL
Albumin: 2.4 g/dL — ABNORMAL LOW (ref 3.5–5.2)
Anion gap: 11 (ref 5–15)
BUN: 31 mg/dL — ABNORMAL HIGH (ref 6–23)
CHLORIDE: 97 mmol/L (ref 96–112)
CO2: 24 mmol/L (ref 19–32)
Calcium: 7.9 mg/dL — ABNORMAL LOW (ref 8.4–10.5)
Creatinine, Ser: 4.18 mg/dL — ABNORMAL HIGH (ref 0.50–1.35)
GFR calc non Af Amer: 16 mL/min — ABNORMAL LOW (ref >90)
GFR, EST AFRICAN AMERICAN: 18 mL/min — AB (ref >90)
Glucose, Bld: 309 mg/dL — ABNORMAL HIGH (ref 70–99)
Phosphorus: 4.3 mg/dL (ref 2.3–4.6)
Potassium: 3.9 mmol/L (ref 3.5–5.1)
Sodium: 132 mmol/L — ABNORMAL LOW (ref 135–145)

## 2014-12-12 LAB — HEMOGLOBIN A1C
HEMOGLOBIN A1C: 15.4 % — AB (ref 4.8–5.6)
Mean Plasma Glucose: 395 mg/dL

## 2014-12-12 LAB — CBC WITH DIFFERENTIAL/PLATELET
BASOS PCT: 0 % (ref 0–1)
Basophils Absolute: 0 10*3/uL (ref 0.0–0.1)
EOS PCT: 2 % (ref 0–5)
Eosinophils Absolute: 0.1 10*3/uL (ref 0.0–0.7)
HCT: 31.4 % — ABNORMAL LOW (ref 39.0–52.0)
Hemoglobin: 10.1 g/dL — ABNORMAL LOW (ref 13.0–17.0)
LYMPHS ABS: 0.7 10*3/uL (ref 0.7–4.0)
Lymphocytes Relative: 14 % (ref 12–46)
MCH: 28.5 pg (ref 26.0–34.0)
MCHC: 32.2 g/dL (ref 30.0–36.0)
MCV: 88.7 fL (ref 78.0–100.0)
MONOS PCT: 7 % (ref 3–12)
Monocytes Absolute: 0.4 10*3/uL (ref 0.1–1.0)
Neutro Abs: 4 10*3/uL (ref 1.7–7.7)
Neutrophils Relative %: 77 % (ref 43–77)
PLATELETS: 95 10*3/uL — AB (ref 150–400)
RBC: 3.54 MIL/uL — ABNORMAL LOW (ref 4.22–5.81)
RDW: 15.1 % (ref 11.5–15.5)
WBC: 5.2 10*3/uL (ref 4.0–10.5)

## 2014-12-12 LAB — GLUCOSE, CAPILLARY
GLUCOSE-CAPILLARY: 64 mg/dL — AB (ref 70–99)
GLUCOSE-CAPILLARY: 115 mg/dL — AB (ref 70–99)
Glucose-Capillary: 147 mg/dL — ABNORMAL HIGH (ref 70–99)
Glucose-Capillary: 279 mg/dL — ABNORMAL HIGH (ref 70–99)
Glucose-Capillary: 320 mg/dL — ABNORMAL HIGH (ref 70–99)
Glucose-Capillary: 323 mg/dL — ABNORMAL HIGH (ref 70–99)
Glucose-Capillary: 413 mg/dL — ABNORMAL HIGH (ref 70–99)

## 2014-12-12 MED ORDER — INSULIN GLARGINE 100 UNIT/ML ~~LOC~~ SOLN
40.0000 [IU] | Freq: Every day | SUBCUTANEOUS | Status: DC
  Administered 2014-12-12: 40 [IU] via SUBCUTANEOUS
  Filled 2014-12-12 (×2): qty 0.4

## 2014-12-12 MED ORDER — INSULIN ASPART 100 UNIT/ML ~~LOC~~ SOLN: 10.0000 [IU] | Freq: Three times a day (TID) | SUBCUTANEOUS | Status: DC

## 2014-12-12 MED ORDER — INSULIN ASPART 100 UNIT/ML ~~LOC~~ SOLN
7.0000 [IU] | Freq: Once | SUBCUTANEOUS | Status: AC
  Administered 2014-12-12: 7 [IU] via SUBCUTANEOUS

## 2014-12-12 MED ORDER — VANCOMYCIN HCL IN DEXTROSE 1-5 GM/200ML-% IV SOLN
1000.0000 mg | INTRAVENOUS | Status: DC
  Administered 2014-12-13 – 2014-12-18 (×3): 1000 mg via INTRAVENOUS
  Filled 2014-12-12 (×6): qty 200

## 2014-12-12 MED ORDER — VANCOMYCIN HCL 1000 MG IV SOLR
1500.0000 mg | Freq: Once | INTRAVENOUS | Status: AC
  Administered 2014-12-12: 1500 mg via INTRAVENOUS
  Filled 2014-12-12: qty 1500

## 2014-12-12 NOTE — Progress Notes (Signed)
  Subjective: Patient reports improvement in blood sugars today. Discussed with Medicine today. Will plan for corporeal re-drainage for Friday. ( arranging thru).   Objective: Vital signs in last 24 hours: Temp:  [98.1 F (36.7 C)-100 F (37.8 C)] 98.7 F (37.1 C) (02/03 1733) Pulse Rate:  [80-89] 80 (02/03 1733) Resp:  [17-18] 18 (02/03 1733) BP: (123-140)/(52-64) 140/61 mmHg (02/03 1733) SpO2:  [94 %-100 %] 96 % (02/03 1733) Weight:  [79.9 kg (176 lb 2.4 oz)-83.7 kg (184 lb 8.4 oz)] 79.9 kg (176 lb 2.4 oz) (02/03 1006)  Intake/Output from previous day: 02/02 0701 - 02/03 0700 In: 1940 [P.O.:1440; IV Piggyback:500] Out: 4451 [Urine:450; Stool:1] Intake/Output this shift: Total I/O In: 560 [P.O.:560] Out: 1 [Stool:1]  Physical Exam:  General:alert and cooperative GI: not done Penis: edema. .old drainage.    Lab Results:  Recent Labs  12/10/14 1927 12/11/14 0227 12/12/14 0930  HGB 11.3* 10.1* 10.1*  HCT 34.5* 30.5* 31.4*   BMET  Recent Labs  12/11/14 1032 12/12/14 0930  NA 128* 132*  K 3.2* 3.9  CL 96 97  CO2 24 24  GLUCOSE 290* 309*  BUN 37* 31*  CREATININE 3.62* 4.18*  CALCIUM 8.6 7.9*   No results for input(s): LABPT, INR in the last 72 hours. No results for input(s): LABURIN in the last 72 hours. Results for orders placed or performed during the hospital encounter of 12/10/14  Culture, blood (routine x 2)     Status: None (Preliminary result)   Collection Time: 12/10/14  7:27 PM  Result Value Ref Range Status   Specimen Description BLOOD RIGHT ARM  Final   Special Requests BOTTLES DRAWN AEROBIC AND ANAEROBIC 5CC  Final   Culture   Final           BLOOD CULTURE RECEIVED NO GROWTH TO DATE CULTURE WILL BE HELD FOR 5 DAYS BEFORE ISSUING A FINAL NEGATIVE REPORT Performed at [facility]    Report Status PENDING  Incomplete  Culture, blood (routine x 2)     Status: None (Preliminary result)   Collection Time: 12/10/14  7:54 PM  Result Value  Ref Range Status   Specimen Description BLOOD RIGHT HAND  Final   Special Requests BOTTLES DRAWN AEROBIC ONLY 1CC  Final   Culture   Final           BLOOD CULTURE RECEIVED NO GROWTH TO DATE CULTURE WILL BE HELD FOR 5 DAYS BEFORE ISSUING A FINAL NEGATIVE REPORT Note: Culture results may be compromised due to an inadequate volume of blood received in culture bottles. Performed at [facility]    Report Status PENDING  Incomplete  MRSA PCR Screening     Status: None   Collection Time: 12/11/14  4:35 AM  Result Value Ref Range Status   MRSA by PCR NEGATIVE NEGATIVE Final    Comment:        The GeneXpert MRSA Assay (FDA approved for NASAL specimens only), is one component of a comprehensive MRSA colonization surveillance program. It is not intended to diagnose MRSA infection nor to guide or monitor treatment for MRSA infections.     Studies/Results: No results found.  Assessment/Plan: Residual corporeal infection  Surgery scheduling for Friday, if possible.   LOS: 2 days   Taylor [name] I 12/12/2014, 6:33 PM

## 2014-12-12 NOTE — Progress Notes (Addendum)
[facility] PROGRESS NOTE  KIN TOTTY DXA:[id number] DOB: 1971-07-03 DOA: 12/10/2014 PCP: [name], MD  Assessment/Plan: #1 hyperosmolar non ketotic hyperglycemia Sugars improved while the patient was on the insulin drip, sugars trending back up again slowly. Significantly uncontrolled this morning. Hemoglobin A1c 15.4 Increase Lantus to 40 units Secondary to acute infection of scrotal abscess/wound infection and UTI. ?? Diabetes coordinator consult. Patient may need to be transitioned back to glucose stabilizer drip if CBG uncontrolled by Friday in anticipation of his surgery. 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 Continue IV Primaxin/added vancomycin given persistent drainage. Prior to admission patient was on Levaquin 500mg  QOD x 10 days. He is s/p removal of IPP by Dr. Retta Diones on 08/23/14. He was last seen on 08/22/14 after he completed ABX 48 hrs ago. SMZ-TMP DS 1 po BID X 10 days. Stated that he continued to have significant penile pain/swelling. Blood sugars had been extremely high probably secondary to underlying infection. Have called Dr. Imelda Pillow office and requested to see the patient on 2/3. According to him the patient may reschedule to go back to the OR on Friday.   #3 end-stage renal disease on hemodialysis Nephrology following. dialyzed 3 times a week on Tuesday, Thursday, Saturday, in [place].   #4 hyponatremia Likely multifactorial secondary to hyperglycemia and problem #3. Hyponatremia improving. Patient on hemodialysis today.  #5 urinary tract infection Check urine cultures. Patient on IV Primaxin.  #6 hypertension Continue home regimen of clonidine, Coreg, Imdur.  #7 hypothyroidism Continue Synthroid.  #8 depression Continue Paxil, Celexa, Lyrica.  #9 hypokalemia Patient hemodialysis. Per renal.  #10  prophylaxis Heparin for DVT prophylaxis.  Code Status: Full Family Communication: No family at bedside. Disposition Plan: Likely incision and drainage by urology on Friday   Consultants:  Neurology: Dr. Patsi Sears 12/10/2014  Nephrology: Dr. Briant Cedar 12/11/2014  Procedures:  None  Antibiotics:  IV Primaxin 12/10/14  HPI/Subjective: CBG in the 300-400 range,, patient states that he has been having penile drainage, however afebrile  Objective: Filed Vitals:   12/12/14 1006  BP: 128/64  Pulse: 83  Temp: 100 F (37.8 C)  Resp: 18    Intake/Output Summary (Last 24 hours) at 12/12/14 1035 Last data filed at 12/12/14 1008  Gross per 24 hour  Intake   1820 ml  Output   4150 ml  Net  -2330 ml   Filed Weights   12/11/14 1028 12/11/14 1402 12/11/14 2017  Weight: 81.2 kg (179 lb 0.2 oz) 77.2 kg (170 lb 3.1 oz) 83.7 kg (184 lb 8.4 oz)    Exam:   General:  NAD  Cardiovascular: RRR  Respiratory: CTAB anterior lung fields  Abdomen: Soft/NT/ND/+BS  Musculoskeletal: No c/c/e  Data Reviewed: Basic Metabolic Panel:  Recent Labs Lab 12/10/14 0635 12/10/14 0950 12/10/14 1927 12/11/14 0227 12/11/14 1032  NA 120* 123* 131* 127* 128*  K 3.8 4.2 4.2 3.9 3.2*  CL 85* 88* 94* 94* 96  CO2 22 22 25 24 24   GLUCOSE 802* 785* 81 213* 290*  BUN 60* 59* 57* 59* 37*  CREATININE 4.81* 4.69* 4.96* 5.18* 3.62*  CALCIUM 9.1 9.1 9.4 8.5 8.6  PHOS  --   --   --   --  3.6   Liver Function Tests:  Recent Labs Lab 12/10/14 0950 12/11/14 0227  AST 24 24  ALT 25 19  ALKPHOS 299* 246*  BILITOT  1.0 0.9  PROT 7.4 5.8*  ALBUMIN 3.1* 2.4*   No results for input(s): LIPASE, AMYLASE in the last 168 hours. No results for input(s): AMMONIA in the last 168 hours. CBC:  Recent Labs Lab 12/10/14 0635 12/10/14 0950 12/10/14 1927 12/11/14 0227  WBC 6.5 7.3 7.3 5.4  NEUTROABS  --  6.1  --   --   HGB 11.6* 11.8* 11.3* 10.1*  HCT 37.0* 36.8* 34.5* 30.5*  MCV 90.2 90.6 87.1  84.7  PLT 104* 100* 114* 98*   Cardiac Enzymes: No results for input(s): CKTOTAL, CKMB, CKMBINDEX, TROPONINI in the last 168 hours. BNP (last 3 results) No results for input(s): BNP in the last 8760 hours.  ProBNP (last 3 results)  Recent Labs  03/26/14 1555  PROBNP 1212.0*    CBG:  Recent Labs Lab 12/11/14 1728 12/11/14 2015 12/12/14 0007 12/12/14 0421 12/12/14 0822  GLUCAP 269* 385* 413* 320* 323*    Recent Results (from the past 240 hour(s))  Culture, blood (routine x 2)     Status: None (Preliminary result)   Collection Time: 12/10/14  7:27 PM  Result Value Ref Range Status   Specimen Description BLOOD RIGHT ARM  Final   Special Requests BOTTLES DRAWN AEROBIC AND ANAEROBIC 5CC  Final   Culture   Final           BLOOD CULTURE RECEIVED NO GROWTH TO DATE CULTURE WILL BE HELD FOR 5 DAYS BEFORE ISSUING A FINAL NEGATIVE REPORT Performed at [facility]    Report Status PENDING  Incomplete  Culture, blood (routine x 2)     Status: None (Preliminary result)   Collection Time: 12/10/14  7:54 PM  Result Value Ref Range Status   Specimen Description BLOOD RIGHT HAND  Final   Special Requests BOTTLES DRAWN AEROBIC ONLY 1CC  Final   Culture   Final           BLOOD CULTURE RECEIVED NO GROWTH TO DATE CULTURE WILL BE HELD FOR 5 DAYS BEFORE ISSUING A FINAL NEGATIVE REPORT Note: Culture results may be compromised due to an inadequate volume of blood received in culture bottles. Performed at [facility]    Report Status PENDING  Incomplete  MRSA PCR Screening     Status: None   Collection Time: 12/11/14  4:35 AM  Result Value Ref Range Status   MRSA by PCR NEGATIVE NEGATIVE Final    Comment:        The GeneXpert MRSA Assay (FDA approved for NASAL specimens only), is one component of a comprehensive MRSA colonization surveillance program. It is not intended to diagnose MRSA infection nor to guide or monitor treatment for MRSA infections.       Studies: No results found.  Scheduled Meds: . amitriptyline  25 mg Oral QHS  . aspirin  81 mg Oral q morning - 10a  . carvedilol  25 mg Oral BID WC  . cinacalcet  60 mg Oral Q supper  . citalopram  20 mg Oral Daily  . cloNIDine  0.1 mg Oral BID  . [START ON 12/18/2014] Darbepoetin Alfa  40 mcg Intravenous Q7 days  . doxercalciferol  1.5 mcg Intravenous Q T,Th,Sa-HD  . imipenem-cilastatin  250 mg Intravenous Q6H  . insulin aspart  0-9 Units Subcutaneous TID WC  . insulin aspart  5 Units Subcutaneous TID WC  . insulin glargine  40 Units Subcutaneous Daily  . isosorbide mononitrate  30 mg Oral BID  . levothyroxine  75 mcg  Oral QAC breakfast  . PARoxetine  20 mg Oral q morning - 10a  . pregabalin  150 mg Oral BID  . rosuvastatin  20 mg Oral QHS  . sevelamer carbonate  1,600 mg Oral TID WC  . 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 If 7PM-7AM, please contact night-coverage at [email or web address], password [facility] 12/12/2014, 10:35 AM  LOS: 2 days

## 2014-12-12 NOTE — Progress Notes (Signed)
Inpatient Diabetes Program Recommendations  AACE/ADA: New Consensus Statement on Inpatient Glycemic Control (2013)  Target Ranges:  Prepandial:   less than 140 mg/dL      Peak postprandial:   less than 180 mg/dL (1-2 hours)      Critically ill patients:  140 - 180 mg/dL   Consult received for uncontrolled CBG Recommend increasing Novolog with meals to 10 units TID. Thank you  Piedad Climes BSN, RN,CDE Inpatient Diabetes Coordinator [phone] (team pager)

## 2014-12-12 NOTE — Progress Notes (Signed)
S: No new CO O:BP 128/64 mmHg  Pulse 83  Temp(Src) 100 F (37.8 C) (Oral)  Resp 18  Ht 6\' 3"  (1.905 m)  Wt 83.7 kg (184 lb 8.4 oz)  BMI 23.06 kg/m2  SpO2 95%  Intake/Output Summary (Last 24 hours) at 12/12/14 1103 Last data filed at 12/12/14 1008  Gross per 24 hour  Intake   1820 ml  Output   4150 ml  Net  -2330 ml   Weight change: -0.1 kg (-3.5 oz) 02/10/15 and alert CVS:RRR Resp:clear Abd:+BS NTND Ext: No edema  LUA AVG + bruit NEURO:CNI Ox3 no asterixis   . amitriptyline  25 mg Oral QHS  . aspirin  81 mg Oral q morning - 10a  . carvedilol  25 mg Oral BID WC  . cinacalcet  60 mg Oral Q supper  . citalopram  20 mg Oral Daily  . cloNIDine  0.1 mg Oral BID  . [START ON 12/18/2014] Darbepoetin Alfa  40 mcg Intravenous Q7 days  . doxercalciferol  1.5 mcg Intravenous Q T,Th,Sa-HD  . imipenem-cilastatin  250 mg Intravenous Q6H  . insulin aspart  0-9 Units Subcutaneous TID WC  . insulin aspart  5 Units Subcutaneous TID WC  . insulin glargine  40 Units Subcutaneous Daily  . isosorbide mononitrate  30 mg Oral BID  . levothyroxine  75 mcg Oral QAC breakfast  . PARoxetine  20 mg Oral q morning - 10a  . pregabalin  150 mg Oral BID  . rosuvastatin  20 mg Oral QHS  . sevelamer carbonate  1,600 mg Oral TID WC  . vancomycin  1,500 mg Intravenous Once   No results found. BMET    Component Value Date/Time   NA 128* 12/11/2014 1032   NA 134 12/05/2012 1419   K 3.2* 12/11/2014 1032   CL 96 12/11/2014 1032   CO2 24 12/11/2014 1032   GLUCOSE 290* 12/11/2014 1032   GLUCOSE 575* 12/05/2012 1419   BUN 37* 12/11/2014 1032   BUN 44* 12/05/2012 1419   CREATININE 3.62* 12/11/2014 1032   CALCIUM 8.6 12/11/2014 1032   GFRNONAA 19* 12/11/2014 1032   GFRAA 22* 12/11/2014 1032   CBC    Component Value Date/Time   WBC 5.2 12/12/2014 0930   WBC 6.2 12/05/2012 1419   RBC 3.54* 12/12/2014 0930   RBC 3.93* 12/05/2012 1419   HGB 10.1* 12/12/2014 0930   HCT 31.4* 12/12/2014 0930   PLT 95* 12/12/2014 0930   MCV 88.7 12/12/2014 0930   MCH 28.5 12/12/2014 0930   MCH 30.3 12/05/2012 1419   MCHC 32.2 12/12/2014 0930   MCHC 32.2 12/05/2012 1419   RDW 15.1 12/12/2014 0930   RDW 14.2 12/05/2012 1419   LYMPHSABS 0.7 12/12/2014 0930   LYMPHSABS 0.9 12/05/2012 1419   MONOABS 0.4 12/12/2014 0930   EOSABS 0.1 12/12/2014 0930   EOSABS 0.2 12/05/2012 1419   BASOSABS 0.0 12/12/2014 0930   BASOSABS 0.0 12/05/2012 1419     Assessment: 1. ESRD 2. 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 Anemia on aranesp 4. HTN 5. DM 6. Scrotal abscess/penile infection  Plan: 1. HD tomorrow 2.  No need to give K supp   Eevee Borbon T

## 2014-12-12 NOTE — Progress Notes (Signed)
ANTIBIOTIC CONSULT NOTE - FOLLOW UP  Pharmacy Consult for Imipenem and Vancomycin Indication: wound infection  Allergies  Allergen Reactions  . Amoxicillin-Pot Clavulanate Nausea And Vomiting  . Hydromorphone Hcl     "body started down"  08/22/14 - pt states no longer has problems with this  . Rifampin Nausea And Vomiting    Patient Measurements: Height: 6\' 3"  (190.5 cm) Weight: 176 lb 2.4 oz (79.9 kg) IBW/kg (Calculated) : 84.5  Vital Signs: Temp: 100 F (37.8 C) (02/03 1006) Temp Source: Oral (02/03 1006) BP: 128/64 mmHg (02/03 1006) Pulse Rate: 83 (02/03 1006) Intake/Output from previous day: 02/02 0701 - 02/03 0700 In: 1940 [P.O.:1440; IV Piggyback:500] Out: 4451 [Urine:450; Stool:1] Intake/Output from this shift: Total I/O In: 240 [P.O.:240] Out: 0   Labs:  Recent Labs  12/10/14 1927 12/11/14 0227 12/11/14 1032 12/12/14 0930  WBC 7.3 5.4  --  5.2  HGB 11.3* 10.1*  --  10.1*  PLT 114* 98*  --  95*  CREATININE 4.96* 5.18* 3.62* 4.18*   ESRD   Assessment:   Day # 3 Imipenem for scrotal abscess/penile infection. 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  Initial plan for urologic procedure postponed due to hyperglycemia, noted planned for 12/14/14.   Vancomycin added today due to persistent drainage.   Tmax 100, WBC 5.2.  No growth in cultures to date.   ESRD. Usual TTS hemodialysis. For HD on 12/13/14 (Thursday).  Goal of Therapy:  Vancomycin pre-dialysis levels 15-25 mcg/ml appropriate Imipenem dose for renal function and infection  Plan:   Vancomycin 1500 mg IV x 1 today, then 1 gram after HD on TTS.  Continue Imipenem 250 mg IV q6hrs.  Will follow culture data, OR plans, and progress.  10-03-1996, RPh Pager: [phone] 12/12/2014,1:00 PM

## 2014-12-13 LAB — GLUCOSE, CAPILLARY
GLUCOSE-CAPILLARY: 121 mg/dL — AB (ref 70–99)
GLUCOSE-CAPILLARY: 144 mg/dL — AB (ref 70–99)
Glucose-Capillary: 99 mg/dL (ref 70–99)
Glucose-Capillary: 82 mg/dL (ref 70–99)
Glucose-Capillary: 104 mg/dL — ABNORMAL HIGH (ref 70–99)
Glucose-Capillary: 170 mg/dL — ABNORMAL HIGH (ref 70–99)

## 2014-12-13 MED ORDER — DOXERCALCIFEROL 4 MCG/2ML IV SOLN
INTRAVENOUS | Status: AC
  Administered 2014-12-13: 1.5 ug via INTRAVENOUS
  Filled 2014-12-13: qty 2

## 2014-12-13 MED ORDER — CLINDAMYCIN PHOSPHATE 900 MG/50ML IV SOLN
900.0000 mg | INTRAVENOUS | Status: DC
  Filled 2014-12-13: qty 50

## 2014-12-13 MED ORDER — INSULIN GLARGINE 100 UNIT/ML ~~LOC~~ SOLN
20.0000 [IU] | Freq: Once | SUBCUTANEOUS | Status: AC
  Administered 2014-12-14: 20 [IU] via SUBCUTANEOUS
  Filled 2014-12-13: qty 0.2

## 2014-12-13 MED ORDER — SODIUM CHLORIDE 0.9 % IV SOLN
INTRAVENOUS | Status: DC
  Administered 2014-12-13: 15:00:00 via INTRAVENOUS

## 2014-12-13 MED ORDER — GENTAMICIN SULFATE 40 MG/ML IJ SOLN
5.0000 mg/kg | INTRAVENOUS | Status: DC
  Filled 2014-12-13: qty 10

## 2014-12-13 MED ORDER — CLINDAMYCIN PHOSPHATE 900 MG/50ML IV SOLN
900.0000 mg | INTRAVENOUS | Status: AC
  Administered 2014-12-14: 900 mg via INTRAVENOUS
  Filled 2014-12-13: qty 50

## 2014-12-13 MED ORDER — SODIUM CHLORIDE 0.9 % IV SOLN
INTRAVENOUS | Status: DC
  Administered 2014-12-13 – 2014-12-14 (×2): via INTRAVENOUS

## 2014-12-13 MED ORDER — INSULIN GLARGINE 100 UNIT/ML ~~LOC~~ SOLN
40.0000 [IU] | Freq: Once | SUBCUTANEOUS | Status: AC
  Administered 2014-12-13: 40 [IU] via SUBCUTANEOUS
  Filled 2014-12-13: qty 0.4

## 2014-12-13 MED ORDER — SACCHAROMYCES BOULARDII 250 MG PO CAPS
250.0000 mg | ORAL_CAPSULE | Freq: Two times a day (BID) | ORAL | Status: DC
  Administered 2014-12-13 – 2014-12-21 (×17): 250 mg via ORAL
  Filled 2014-12-13 (×18): qty 1

## 2014-12-13 MED ORDER — GENTAMICIN SULFATE 40 MG/ML IJ SOLN
160.0000 mg | INTRAVENOUS | Status: AC
  Administered 2014-12-14: 160 mg via INTRAVENOUS
  Filled 2014-12-13: qty 4

## 2014-12-13 NOTE — Progress Notes (Signed)
Inpatient Diabetes Program Recommendations  AACE/ADA: New Consensus Statement on Inpatient Glycemic Control (2013)  Target Ranges:  Prepandial:   less than 140 mg/dL      Peak postprandial:   less than 180 mg/dL (1-2 hours)      Critically ill patients:  140 - 180 mg/dL   Recommend discontinue Novolog 10 TID and continue Novolog 5 TID. May also need a reduced dose of Lantus.  Notified Dr. Susie Cassette of duplicate Novolog meal coverage order.  She will discontinue Novolog 10 units TID.   Will follow. Thank you  Piedad Climes BSN, RN,CDE Inpatient Diabetes Coordinator [phone] (team pager)

## 2014-12-13 NOTE — Progress Notes (Addendum)
[facility] PROGRESS NOTE  Taylor [name] OZD:[id number] DOB: [birth year] DOA: 12/10/2014 PCP: [name], MD  Assessment/Plan: #1 hyperosmolar non ketotic hyperglycemia Sugars improved while the patient was on the insulin drip, sugars trending back up again slowly. Hemoglobin A1c 15.4 Continue Lantus 40 units today, 20 units tomorrow because patient will be nothing by mouth  Patient may need to be transitioned back to glucose stabilizer drip if CBG uncontrolled by Friday in anticipation of his surgery. 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 Continue IV Primaxin/continue vancomycin given persistent drainage. Prior to admission patient was on Levaquin 500mg  QOD x 10 days. He is s/p removal of IPP by Dr. Retta Diones on 08/23/14. He was last seen on 08/22/14 after he completed ABX 48 hrs ago. SMZ-TMP DS 1 po BID X 10 days.   According to Dr. Patsi Sears he will go to the OR on Friday Made nothing by mouth past midnight  Diarrhea Started the patient on florastor , rule out C. difficile    #3 end-stage renal disease on hemodialysis Nephrology following. dialyzed 3 times a week on Tuesday, Thursday, Saturday, in [place].   #4 hyponatremia Likely multifactorial secondary to hyperglycemia  Monitor levels during hemodialysis  #5 urinary tract infection Check urine cultures. Patient on IV Primaxin.  #6 hypertension Continue home regimen of clonidine, Coreg, Imdur.  #7 hypothyroidism Continue Synthroid.  #8 depression Continue Paxil, Celexa, Lyrica.  #9 hypokalemia Patient hemodialysis. Per renal.  #10 prophylaxis Heparin for DVT prophylaxis.  Code Status: Full Family Communication: No family at bedside. Disposition Plan: Likely incision and drainage by urology on  Friday   Consultants:  Neurology: Dr. Patsi Sears 12/10/2014  Nephrology: Dr. Briant Cedar 12/11/2014  Procedures:  None  Antibiotics:  IV Primaxin 12/10/14  HPI/Subjective: CBG improving, CBG 64 last night, started having diarrhea today,     Objective: Filed Vitals:   12/13/14 1011  BP: 100/66  Pulse: 75  Temp:   Resp: 18    Intake/Output Summary (Last 24 hours) at 12/13/14 1046 Last data filed at 12/12/14 1737  Gross per 24 hour  Intake    320 ml  Output      1 ml  Net    319 ml   Filed Weights   12/11/14 2017 12/12/14 1006 12/13/14 0845  Weight: 83.7 kg (184 lb 8.4 oz) 79.9 kg (176 lb 2.4 oz) 80.9 kg (178 lb 5.6 oz)    Exam:   General:  NAD  Cardiovascular: RRR  Respiratory: CTAB anterior lung fields  Abdomen: Soft/NT/ND/+BS  Musculoskeletal: No c/c/e  GU-old penile drainage  Data Reviewed: Basic Metabolic Panel:  Recent Labs Lab 12/10/14 0950 12/10/14 1927 12/11/14 0227 12/11/14 1032 12/12/14 0930  NA 123* 131* 127* 128* 132*  K 4.2 4.2 3.9 3.2* 3.9  CL 88* 94* 94* 96 97  CO2 22 25 24 24 24   GLUCOSE 785* 81 213* 290* 309*  BUN 59* 57* 59* 37* 31*  CREATININE 4.69* 4.96* 5.18* 3.62* 4.18*  CALCIUM 9.1 9.4 8.5 8.6 7.9*  PHOS  --   --   --  3.6 4.3   Liver Function Tests:  Recent Labs Lab 12/10/14 0950 12/11/14 0227 12/12/14 0930  AST 24 24  --   ALT 25 19  --   ALKPHOS 299* 246*  --   BILITOT 1.0 0.9  --   PROT 7.4 5.8*  --  ALBUMIN 3.1* 2.4* 2.4*   No results for input(s): LIPASE, AMYLASE in the last 168 hours. No results for input(s): AMMONIA in the last 168 hours. CBC:  Recent Labs Lab 12/10/14 0635 12/10/14 0950 12/10/14 1927 12/11/14 0227 12/12/14 0930  WBC 6.5 7.3 7.3 5.4 5.2  NEUTROABS  --  6.1  --   --  4.0  HGB 11.6* 11.8* 11.3* 10.1* 10.1*  HCT 37.0* 36.8* 34.5* 30.5* 31.4*  MCV 90.2 90.6 87.1 84.7 88.7  PLT 104* 100* 114* 98* 95*   Cardiac Enzymes: No results for input(s): CKTOTAL, CKMB, CKMBINDEX,  TROPONINI in the last 168 hours. BNP (last 3 results) No results for input(s): BNP in the last 8760 hours.  ProBNP (last 3 results)  Recent Labs  03/26/14 1555  PROBNP 1212.0*    CBG:  Recent Labs Lab 12/12/14 2009 12/12/14 2050 12/13/14 0102 12/13/14 0359 12/13/14 0813  GLUCAP 64* 115* 121* 99 82    Recent Results (from the past 240 hour(s))  Culture, blood (routine x 2)     Status: None (Preliminary result)   Collection Time: 12/10/14  7:27 PM  Result Value Ref Range Status   Specimen Description BLOOD RIGHT ARM  Final   Special Requests BOTTLES DRAWN AEROBIC AND ANAEROBIC 5CC  Final   Culture   Final           BLOOD CULTURE RECEIVED NO GROWTH TO DATE CULTURE WILL BE HELD FOR 5 DAYS BEFORE ISSUING A FINAL NEGATIVE REPORT Performed at [facility]    Report Status PENDING  Incomplete  Culture, blood (routine x 2)     Status: None (Preliminary result)   Collection Time: 12/10/14  7:54 PM  Result Value Ref Range Status   Specimen Description BLOOD RIGHT HAND  Final   Special Requests BOTTLES DRAWN AEROBIC ONLY 1CC  Final   Culture   Final           BLOOD CULTURE RECEIVED NO GROWTH TO DATE CULTURE WILL BE HELD FOR 5 DAYS BEFORE ISSUING A FINAL NEGATIVE REPORT Note: Culture results may be compromised due to an inadequate volume of blood received in culture bottles. Performed at [facility]    Report Status PENDING  Incomplete  MRSA PCR Screening     Status: None   Collection Time: 12/11/14  4:35 AM  Result Value Ref Range Status   MRSA by PCR NEGATIVE NEGATIVE Final    Comment:        The GeneXpert MRSA Assay (FDA approved for NASAL specimens only), is one component of a comprehensive MRSA colonization surveillance program. It is not intended to diagnose MRSA infection nor to guide or monitor treatment for MRSA infections.      Studies: No results found.  Scheduled Meds: . amitriptyline  25 mg Oral QHS  . aspirin  81 mg Oral q morning  - 10a  . carvedilol  25 mg Oral BID WC  . cinacalcet  60 mg Oral Q supper  . citalopram  20 mg Oral Daily  . cloNIDine  0.1 mg Oral BID  . [START ON 12/18/2014] Darbepoetin Alfa  40 mcg Intravenous Q7 days  . doxercalciferol      . doxercalciferol  1.5 mcg Intravenous Q T,Th,Sa-HD  . imipenem-cilastatin  250 mg Intravenous Q6H  . insulin aspart  0-9 Units Subcutaneous TID WC  . insulin aspart  5 Units Subcutaneous TID WC  . insulin glargine  40 Units Subcutaneous Daily  . isosorbide mononitrate  30  mg Oral BID  . levothyroxine  75 mcg Oral QAC breakfast  . PARoxetine  20 mg Oral q morning - 10a  . pregabalin  150 mg Oral BID  . rosuvastatin  20 mg Oral QHS  . sevelamer carbonate  1,600 mg Oral TID WC  . 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 If 7PM-7AM, please contact night-coverage at [email or web address], password [facility] 12/13/2014, 10:46 AM  LOS: 3 days

## 2014-12-13 NOTE — Procedures (Signed)
Pt seen on HD.  Ap 170 Vp 210. BFR 450.  Note plans for surgery tomorrow.  On 4K bath.

## 2014-12-13 NOTE — Progress Notes (Signed)
Hypoglycemic Event  CBG: 64  Treatment: 4oz orange juice  Symptoms: none  Follow-up CBG: Time 2051 CBG Result:115  Possible Reasons for Event: over correction of prior hyperglycemia  Comments/MD notified:protocol    Taylor [name], Taylor [name]  Remember to initiate Hypoglycemia Order Set & complete

## 2014-12-13 NOTE — Progress Notes (Signed)
Pt has new order for NS @ 50 ml hr. Paged Dr. Susie Cassette to clarify.

## 2014-12-14 ENCOUNTER — Encounter (HOSPITAL_COMMUNITY)
Admission: EM | Disposition: A | Discharge status: Home / Self Care | Payer: Self-pay | Source: Home / Self Care | Attending: Internal Medicine

## 2014-12-14 ENCOUNTER — Inpatient Hospital Stay (HOSPITAL_COMMUNITY): Payer: Medicare Other | Admitting: Certified Registered Nurse Anesthetist

## 2014-12-14 ENCOUNTER — Encounter (HOSPITAL_COMMUNITY): Payer: Self-pay | Admitting: Certified Registered Nurse Anesthetist

## 2014-12-14 HISTORY — PX: IRRIGATION AND DEBRIDEMENT ABSCESS: SHX5252

## 2014-12-14 LAB — GLUCOSE, CAPILLARY
GLUCOSE-CAPILLARY: 134 mg/dL — AB (ref 70–99)
GLUCOSE-CAPILLARY: 148 mg/dL — AB (ref 70–99)
GLUCOSE-CAPILLARY: 211 mg/dL — AB (ref 70–99)
Glucose-Capillary: 164 mg/dL — ABNORMAL HIGH (ref 70–99)
Glucose-Capillary: 158 mg/dL — ABNORMAL HIGH (ref 70–99)
Glucose-Capillary: 167 mg/dL — ABNORMAL HIGH (ref 70–99)

## 2014-12-14 LAB — COMPREHENSIVE METABOLIC PANEL
ALK PHOS: 233 U/L — AB (ref 39–117)
ALT: 13 U/L (ref 0–53)
ANION GAP: 10 (ref 5–15)
AST: 22 U/L (ref 0–37)
Albumin: 2.4 g/dL — ABNORMAL LOW (ref 3.5–5.2)
BUN: 29 mg/dL — ABNORMAL HIGH (ref 6–23)
CHLORIDE: 98 mmol/L (ref 96–112)
CO2: 26 mmol/L (ref 19–32)
CREATININE: 3.86 mg/dL — AB (ref 0.50–1.35)
Calcium: 7.7 mg/dL — ABNORMAL LOW (ref 8.4–10.5)
GFR calc Af Amer: 20 mL/min — ABNORMAL LOW (ref >90)
GFR calc non Af Amer: 17 mL/min — ABNORMAL LOW (ref >90)
Glucose, Bld: 176 mg/dL — ABNORMAL HIGH (ref 70–99)
Potassium: 4.4 mmol/L (ref 3.5–5.1)
Sodium: 134 mmol/L — ABNORMAL LOW (ref 135–145)
Total Bilirubin: 0.7 mg/dL (ref 0.3–1.2)
Total Protein: 6.1 g/dL (ref 6.0–8.3)

## 2014-12-14 LAB — CBC
HCT: 32.2 % — ABNORMAL LOW (ref 39.0–52.0)
Hemoglobin: 10.1 g/dL — ABNORMAL LOW (ref 13.0–17.0)
MCH: 27.9 pg (ref 26.0–34.0)
MCHC: 31.4 g/dL (ref 30.0–36.0)
MCV: 89 fL (ref 78.0–100.0)
Platelets: 79 10*3/uL — ABNORMAL LOW (ref 150–400)
RBC: 3.62 MIL/uL — ABNORMAL LOW (ref 4.22–5.81)
RDW: 15.4 % (ref 11.5–15.5)
WBC: 4.6 10*3/uL (ref 4.0–10.5)

## 2014-12-14 SURGERY — IRRIGATION AND DEBRIDEMENT ABSCESS
Anesthesia: General | Laterality: Bilateral

## 2014-12-14 MED ORDER — SODIUM CHLORIDE 0.9 % IV SOLN
INTRAVENOUS | Status: DC
  Administered 2014-12-14: 11:00:00 via INTRAVENOUS

## 2014-12-14 MED ORDER — ONDANSETRON HCL 4 MG/2ML IJ SOLN
INTRAMUSCULAR | Status: AC
  Filled 2014-12-14: qty 2

## 2014-12-14 MED ORDER — SODIUM CHLORIDE 0.9 % IJ SOLN
INTRAMUSCULAR | Status: AC
  Filled 2014-12-14: qty 10

## 2014-12-14 MED ORDER — RENA-VITE PO TABS
1.0000 | ORAL_TABLET | Freq: Every day | ORAL | Status: DC
  Administered 2014-12-14 – 2014-12-20 (×7): 1 via ORAL
  Filled 2014-12-14 (×10): qty 1

## 2014-12-14 MED ORDER — INSULIN GLARGINE 100 UNIT/ML ~~LOC~~ SOLN
40.0000 [IU] | Freq: Every day | SUBCUTANEOUS | Status: DC
Start: 2014-12-15 — End: 2014-12-16
  Administered 2014-12-15: 40 [IU] via SUBCUTANEOUS
  Filled 2014-12-14 (×2): qty 0.4

## 2014-12-14 MED ORDER — PHENYLEPHRINE 40 MCG/ML (10ML) SYRINGE FOR IV PUSH (FOR BLOOD PRESSURE SUPPORT)
PREFILLED_SYRINGE | INTRAVENOUS | Status: AC
  Filled 2014-12-14: qty 10

## 2014-12-14 MED ORDER — LIDOCAINE HCL (CARDIAC) 20 MG/ML IV SOLN
INTRAVENOUS | Status: DC | PRN
  Administered 2014-12-14: 60 mg via INTRAVENOUS

## 2014-12-14 MED ORDER — PHENYLEPHRINE HCL 10 MG/ML IJ SOLN
INTRAMUSCULAR | Status: DC | PRN
  Administered 2014-12-14 (×3): 80 ug via INTRAVENOUS

## 2014-12-14 MED ORDER — PROMETHAZINE HCL 25 MG/ML IJ SOLN: 6.2500 mg | INTRAMUSCULAR | Status: DC | PRN

## 2014-12-14 MED ORDER — SUCCINYLCHOLINE CHLORIDE 20 MG/ML IJ SOLN
INTRAMUSCULAR | Status: DC | PRN
  Administered 2014-12-14: 100 mg via INTRAVENOUS

## 2014-12-14 MED ORDER — PROPOFOL 10 MG/ML IV BOLUS
INTRAVENOUS | Status: DC | PRN
Start: 2014-12-14 — End: 2014-12-14
  Administered 2014-12-14: 100 mg via INTRAVENOUS

## 2014-12-14 MED ORDER — LIDOCAINE HCL (CARDIAC) 20 MG/ML IV SOLN
INTRAVENOUS | Status: AC
  Filled 2014-12-14: qty 5

## 2014-12-14 MED ORDER — SODIUM CHLORIDE 0.45 % IV SOLN: INTRAVENOUS | Status: DC

## 2014-12-14 MED ORDER — FENTANYL CITRATE 0.05 MG/ML IJ SOLN
INTRAMUSCULAR | Status: DC | PRN
  Administered 2014-12-14: 100 ug via INTRAVENOUS

## 2014-12-14 MED ORDER — MIDAZOLAM HCL 5 MG/5ML IJ SOLN
INTRAMUSCULAR | Status: DC | PRN
  Administered 2014-12-14: 2 mg via INTRAVENOUS

## 2014-12-14 MED ORDER — SUCCINYLCHOLINE CHLORIDE 20 MG/ML IJ SOLN
INTRAMUSCULAR | Status: AC
  Filled 2014-12-14: qty 1

## 2014-12-14 MED ORDER — EPHEDRINE SULFATE 50 MG/ML IJ SOLN
INTRAMUSCULAR | Status: AC
Start: 2014-12-14 — End: 2014-12-14
  Filled 2014-12-14: qty 1

## 2014-12-14 MED ORDER — MIDAZOLAM HCL 2 MG/2ML IJ SOLN
INTRAMUSCULAR | Status: AC
  Filled 2014-12-14: qty 2

## 2014-12-14 MED ORDER — ROCURONIUM BROMIDE 50 MG/5ML IV SOLN
INTRAVENOUS | Status: AC
  Filled 2014-12-14: qty 1

## 2014-12-14 MED ORDER — PROPOFOL 10 MG/ML IV BOLUS
INTRAVENOUS | Status: AC
  Filled 2014-12-14: qty 20

## 2014-12-14 MED ORDER — FENTANYL CITRATE 0.05 MG/ML IJ SOLN
INTRAMUSCULAR | Status: AC
  Filled 2014-12-14: qty 5

## 2014-12-14 MED ORDER — ONDANSETRON HCL 4 MG/2ML IJ SOLN
INTRAMUSCULAR | Status: DC | PRN
  Administered 2014-12-14: 4 mg via INTRAVENOUS

## 2014-12-14 MED ORDER — FENTANYL CITRATE 0.05 MG/ML IJ SOLN: 25.0000 ug | INTRAMUSCULAR | Status: DC | PRN

## 2014-12-14 SURGICAL SUPPLY — 11 items
BLADE 10 SAFETY STRL DISP (BLADE) ×3 IMPLANT
BNDG GAUZE ELAST 4 BULKY (GAUZE/BANDAGES/DRESSINGS) ×2 IMPLANT
CATH FOLEY 2WAY 5CC 16FR (CATHETERS) ×3
CATH FOLEY 2WAY SLVR  5CC 16FR (CATHETERS) ×2
CATH FOLEY 2WAY SLVR 5CC 16FR (CATHETERS) IMPLANT
CATH URTH STD 16FR FL 2W DRN (CATHETERS) IMPLANT
DRAPE LAPAROTOMY 100X72X124 (DRAPES) ×2 IMPLANT
KIT ROOM TURNOVER OR (KITS) ×3 IMPLANT
MANIFOLD NEPTUNE WASTE (CANNULA) ×2 IMPLANT
PAD ARMBOARD 7.5X6 YLW CONV (MISCELLANEOUS) ×6 IMPLANT
PLUG CATH AND CAP STER (CATHETERS) ×2 IMPLANT

## 2014-12-14 NOTE — Progress Notes (Addendum)
[facility] PROGRESS NOTE  Taylor [name] UUV:[id number] DOB: 1970/12/20 DOA: 12/10/2014 PCP: [name], MD  Assessment/Plan: #1 hyperosmolar non ketotic hyperglycemia Sugars improved while the patient was on the insulin drip, sugars trending back up again slowly. Hemoglobin A1c 15.4 Continue Lantus 40 units starting tomorrow. 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 Continue IV Primaxin/continue vancomycin given persistent drainage. Prior to admission patient was on Levaquin 500mg  QOD x 10 days. He is s/p removal of IPP by Dr. on 08/23/14. Recently was also on SMZ-TMP DS 1 po BID X 10 days.    Dr. 08/25/14 to operate on him today for incision and drainage Follow culture data from the wound, taper antibiotics per urology recommendations   Diarrhea Continue florastor , no diarrhea for 1-1/2 days. Now constipated DC contact precautions for C. difficile   #3 end-stage renal disease on hemodialysis Nephrology following. dialyzed 3 times a week on Tuesday, Thursday, Saturday, in [place].   #4 hyponatremia Likely multifactorial secondary to hyperglycemia  Monitor levels during hemodialysis  #5 urinary tract infection Urine culture from 2/2 negative. Patient on IV Primaxin.  #6 hypertension Continue home regimen of clonidine, Coreg, Imdur.  #7 hypothyroidism Continue Synthroid.  #8 depression Continue Paxil, Celexa, Lyrica.  #9 hypokalemia Patient hemodialysis. Per renal.  #10 prophylaxis Heparin for DVT prophylaxis.  Code Status: Full Family Communication: No family at bedside. Disposition Plan: Depends upon urology for surgery, possibly over the weekend   Consultants:  Neurology: Dr. Derby  12/10/2014  Nephrology: Dr. 02/08/2015 12/11/2014  Procedures:  None  Antibiotics:  IV Primaxin 12/10/14  Vancomycin> 2/3  HPI/Subjective:  Patient now complaining of being constipated, had diarrhea yesterday. However no nausea vomiting or chest pain or shortness of breath     Objective: Filed Vitals:   12/14/14 1011  BP: 125/55  Pulse: 76  Temp: 98.1 F (36.7 C)  Resp:     Intake/Output Summary (Last 24 hours) at 12/14/14 1045 Last data filed at 12/14/14 0500  Gross per 24 hour  Intake      0 ml  Output   3400 ml  Net  -3400 ml   Filed Weights   12/13/14 0845 12/13/14 1240 12/14/14 0522  Weight: 80.9 kg (178 lb 5.6 oz) 77.5 kg (170 lb 13.7 oz) 83.3 kg (183 lb 10.3 oz)    Exam:  02/12/15 and alert CVS:RRR Resp:clear Abd:+BS NTND Ext: No edema LUA AVG + bruit NEURO:CNI Ox3 no asterixis  Data Reviewed: Basic Metabolic Panel:  Recent Labs Lab 12/10/14 1927 12/11/14 0227 12/11/14 1032 12/12/14 0930 12/14/14 0530  NA 131* 127* 128* 132* 134*  K 4.2 3.9 3.2* 3.9 4.4  CL 94* 94* 96 97 98  CO2 25 24 24 24 26   GLUCOSE 81 213* 290* 309* 176*  BUN 57* 59* 37* 31* 29*  CREATININE 4.96* 5.18* 3.62* 4.18* 3.86*  CALCIUM 9.4 8.5 8.6 7.9* 7.7*  PHOS  --   --  3.6 4.3  --    Liver Function Tests:  Recent Labs Lab 12/10/14 0950 12/11/14 0227 12/12/14 0930 12/14/14 0530  AST 24 24  --  22  ALT 25 19  --  13  ALKPHOS 299* 246*  --  233*  BILITOT 1.0 0.9  --  0.7  PROT 7.4 5.8*  --  6.1  ALBUMIN 3.1* 2.4* 2.4* 2.4*   No results for input(s): LIPASE, AMYLASE in the last 168 hours. No results for input(s): AMMONIA in the last 168 hours. CBC:  Recent Labs Lab 12/10/14 0950 12/10/14 1927 12/11/14 0227 12/12/14 0930 12/14/14 0530  WBC 7.3 7.3 5.4 5.2 4.6  NEUTROABS 6.1  --   --  4.0  --   HGB 11.8* 11.3* 10.1* 10.1* 10.1*  HCT 36.8* 34.5* 30.5* 31.4* 32.2*  MCV 90.6 87.1 84.7 88.7 89.0  PLT 100* 114* 98* 95* 79*   Cardiac Enzymes: No  results for input(s): CKTOTAL, CKMB, CKMBINDEX, TROPONINI in the last 168 hours. BNP (last 3 results) No results for input(s): BNP in the last 8760 hours.  ProBNP (last 3 results)  Recent Labs  03/26/14 1555  PROBNP 1212.0*    CBG:  Recent Labs Lab 12/13/14 1642 12/13/14 2006 12/14/14 0004 12/14/14 0528 12/14/14 0734  GLUCAP 170* 144* 211* 164* 148*    Recent Results (from the past 240 hour(s))  Culture, blood (routine x 2)     Status: None (Preliminary result)   Collection Time: 12/10/14  7:27 PM  Result Value Ref Range Status   Specimen Description BLOOD RIGHT ARM  Final   Special Requests BOTTLES DRAWN AEROBIC AND ANAEROBIC 5CC  Final   Culture   Final           BLOOD CULTURE RECEIVED NO GROWTH TO DATE CULTURE WILL BE HELD FOR 5 DAYS BEFORE ISSUING A FINAL NEGATIVE REPORT Performed at [facility]    Report Status PENDING  Incomplete  Culture, blood (routine x 2)     Status: None (Preliminary result)   Collection Time: 12/10/14  7:54 PM  Result Value Ref Range Status   Specimen Description BLOOD RIGHT HAND  Final   Special Requests BOTTLES DRAWN AEROBIC ONLY 1CC  Final   Culture   Final           BLOOD CULTURE RECEIVED NO GROWTH TO DATE CULTURE WILL BE HELD FOR 5 DAYS BEFORE ISSUING A FINAL NEGATIVE REPORT Note: Culture results may be compromised due to an inadequate volume of blood received in culture bottles. Performed at [facility]    Report Status PENDING  Incomplete  MRSA PCR Screening     Status: None   Collection Time: 12/11/14  4:35 AM  Result Value Ref Range Status   MRSA by PCR NEGATIVE NEGATIVE Final    Comment:        The GeneXpert MRSA Assay (FDA approved for NASAL specimens only), is one component of a comprehensive MRSA colonization surveillance program. It is not intended to diagnose MRSA infection nor to guide or monitor treatment for MRSA infections.      Studies: No results found.  Scheduled Meds: . [MAR Hold]  amitriptyline  25 mg Oral QHS  . [MAR Hold] aspirin  81 mg Oral q morning - 10a  . [MAR Hold] carvedilol  25 mg Oral BID WC  . [MAR Hold] cinacalcet  60 mg Oral Q supper  . [MAR Hold] citalopram  20 mg Oral Daily  . clindamycin (CLEOCIN) IV  900 mg Intravenous 30 min Pre-Op  . [MAR Hold] cloNIDine  0.1 mg Oral BID  . [MAR Hold] Darbepoetin Alfa  40 mcg Intravenous Q7 days  . [MAR Hold] doxercalciferol  1.5 mcg Intravenous Q T,Th,Sa-HD  . gentamicin  160 mg Intravenous 30 min Pre-Op  . [MAR Hold] imipenem-cilastatin  250 mg Intravenous Q6H  . [  MAR Hold] insulin aspart  0-9 Units Subcutaneous TID WC  . [MAR Hold] insulin aspart  5 Units Subcutaneous TID WC  . [MAR Hold] isosorbide mononitrate  30 mg Oral BID  . [MAR Hold] levothyroxine  75 mcg Oral QAC breakfast  . [MAR Hold] multivitamin  1 tablet Oral QHS  . [MAR Hold] PARoxetine  20 mg Oral q morning - 10a  . [MAR Hold] pregabalin  150 mg Oral BID  . [MAR Hold] rosuvastatin  20 mg Oral QHS  . [MAR Hold] saccharomyces boulardii  250 mg Oral BID  . [MAR Hold] sevelamer carbonate  1,600 mg Oral TID WC  . [MAR Hold] vancomycin  1,000 mg Intravenous Q T,Th,Sa-HD   Continuous Infusions: . sodium chloride 10 mL/hr at 12/13/14 1644  . 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 If 7PM-7AM, please contact night-coverage at [email or web address], password [facility] 12/14/2014, 10:45 AM  LOS: 4 days

## 2014-12-14 NOTE — Progress Notes (Signed)
S: No new CO O:BP 108/50 mmHg  Pulse 75  Temp(Src) 98 F (36.7 C) (Oral)  Resp 18  Ht 6\' 3"  (1.905 m)  Wt 83.3 kg (183 lb 10.3 oz)  BMI 22.95 kg/m2  SpO2 99%  Intake/Output Summary (Last 24 hours) at 12/14/14 0936 Last data filed at 12/14/14 0500  Gross per 24 hour  Intake      0 ml  Output   3400 ml  Net  -3400 ml   Weight change: 1 kg (2 lb 3.3 oz) 02/12/15 and alert CVS:RRR Resp:clear Abd:+BS NTND Ext: No edema  LUA AVG + bruit NEURO:CNI Ox3 no asterixis   . amitriptyline  25 mg Oral QHS  . aspirin  81 mg Oral q morning - 10a  . carvedilol  25 mg Oral BID WC  . cinacalcet  60 mg Oral Q supper  . citalopram  20 mg Oral Daily  . clindamycin (CLEOCIN) IV  900 mg Intravenous 30 min Pre-Op  . cloNIDine  0.1 mg Oral BID  . [START ON 12/18/2014] Darbepoetin Alfa  40 mcg Intravenous Q7 days  . doxercalciferol  1.5 mcg Intravenous Q T,Th,Sa-HD  . gentamicin  160 mg Intravenous 30 min Pre-Op  . imipenem-cilastatin  250 mg Intravenous Q6H  . insulin aspart  0-9 Units Subcutaneous TID WC  . insulin aspart  5 Units Subcutaneous TID WC  . isosorbide mononitrate  30 mg Oral BID  . levothyroxine  75 mcg Oral QAC breakfast  . PARoxetine  20 mg Oral q morning - 10a  . pregabalin  150 mg Oral BID  . rosuvastatin  20 mg Oral QHS  . saccharomyces boulardii  250 mg Oral BID  . sevelamer carbonate  1,600 mg Oral TID WC  . vancomycin  1,000 mg Intravenous Q T,Th,Sa-HD   No results found. BMET    Component Value Date/Time   NA 134* 12/14/2014 0530   NA 134 12/05/2012 1419   K 4.4 12/14/2014 0530   CL 98 12/14/2014 0530   CO2 26 12/14/2014 0530   GLUCOSE 176* 12/14/2014 0530   GLUCOSE 575* 12/05/2012 1419   BUN 29* 12/14/2014 0530   BUN 44* 12/05/2012 1419   CREATININE 3.86* 12/14/2014 0530   CALCIUM 7.7* 12/14/2014 0530   GFRNONAA 17* 12/14/2014 0530   GFRAA 20* 12/14/2014 0530   CBC    Component Value Date/Time   WBC 4.6 12/14/2014 0530   WBC 6.2 12/05/2012 1419   RBC 3.62* 12/14/2014 0530   RBC 3.93* 12/05/2012 1419   HGB 10.1* 12/14/2014 0530   HCT 32.2* 12/14/2014 0530   PLT 79* 12/14/2014 0530   MCV 89.0 12/14/2014 0530   MCH 27.9 12/14/2014 0530   MCH 30.3 12/05/2012 1419   MCHC 31.4 12/14/2014 0530   MCHC 32.2 12/05/2012 1419   RDW 15.4 12/14/2014 0530   RDW 14.2 12/05/2012 1419   LYMPHSABS 0.7 12/12/2014 0930   LYMPHSABS 0.9 12/05/2012 1419   MONOABS 0.4 12/12/2014 0930   EOSABS 0.1 12/12/2014 0930   EOSABS 0.2 12/05/2012 1419   BASOSABS 0.0 12/12/2014 0930   BASOSABS 0.0 12/05/2012 1419     Assessment: 1. ESRD 2. 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 Anemia on aranesp 4. HTN 5. DM 6. Scrotal abscess/penile infection  Plan: 1.  HD tomorrow 2. For surgery today   Taylor [name] T

## 2014-12-14 NOTE — Anesthesia Procedure Notes (Signed)
Procedure Name: Intubation Date/Time: 12/14/2014 11:28 AM Performed by: Carmela Rima Pre-anesthesia Checklist: Timeout performed, Patient being monitored, Suction available, Emergency Drugs available and Patient identified Patient Re-evaluated:Patient Re-evaluated prior to inductionOxygen Delivery Method: Circle system utilized Preoxygenation: Pre-oxygenation with 100% oxygen Intubation Type: IV induction and Rapid sequence Ventilation: Mask ventilation without difficulty Laryngoscope Size: Mac and 3 Grade View: Grade I Tube type: Oral Tube size: 7.5 mm Number of attempts: 1 Placement Confirmation: CO2 detector,  breath sounds checked- equal and bilateral,  positive ETCO2 and ETT inserted through vocal cords under direct vision Secured at: 22 cm Tube secured with: Tape Dental Injury: Teeth and Oropharynx as per pre-operative assessment

## 2014-12-14 NOTE — Progress Notes (Signed)
ANTIBIOTIC CONSULT NOTE - FOLLOW UP  Pharmacy Consult for Imipenem and Vancomycin Indication: wound infection  Allergies  Allergen Reactions  . Amoxicillin-Pot Clavulanate Nausea And Vomiting  . Hydromorphone Hcl     "body started down"  08/22/14 - pt states no longer has problems with this  . Rifampin Nausea And Vomiting    Patient Measurements: Height: 6\' 3"  (190.5 cm) Weight: 183 lb 10.3 oz (83.3 kg) IBW/kg (Calculated) : 84.5  Vital Signs: Temp: 98.2 F (36.8 C) (02/05 1415) Temp Source: Oral (02/05 1415) BP: 99/59 mmHg (02/05 1415) Pulse Rate: 73 (02/05 1415)  Labs:  Recent Labs  12/12/14 0930 12/14/14 0530  WBC 5.2 4.6  HGB 10.1* 10.1*  PLT 95* 79*  CREATININE 4.18* 3.86*   ESRD   Assessment:   Day # 5 Imipenem and day #3 Vancomycin (added 2/3 due to persistent drainage) for scrotal abscess/penile infection. 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    S/p I&D today, 2 drains placed.  Cultures sent. No growth to date from prior cultures.    Also received Clindamycin 900 mg + Gentamicin 160 mg IV pre-op today.   Afebrile, WBC 4.6. ESRD. Usual TTS hemodialysis. For HD on 12/15/14.  Goal of Therapy:  Vancomycin pre-dialysis levels 15-25 mcg/ml appropriate Imipenem dose for renal function and infection  Plan:   Continue Vancomycin 1 gram after HD on TTS.  Continue Imipenem 250 mg IV q6hrs.  Will follow culture data, progress.  Will consider pre-HD vanc level next week.  02/13/15, RPh Pager: [phone] 12/14/2014,3:21 PM

## 2014-12-14 NOTE — Progress Notes (Signed)
Pt back from OR,  Alert oriented.  VSS.  Denies pain.

## 2014-12-14 NOTE — Anesthesia Preprocedure Evaluation (Addendum)
Anesthesia Evaluation  Patient identified by MRN, date of birth, ID band Patient awake    Reviewed: Allergy & Precautions, H&P , NPO status , Patient's Chart, lab work & pertinent test results  Airway Mallampati: III  TM Distance: >3 FB Neck ROM: Full    Dental  (+) Edentulous Upper, Upper Dentures, Poor Dentition, Dental Advidsory Given   Pulmonary shortness of breath, pneumonia -,  breath sounds clear to auscultation  Pulmonary exam normal       Cardiovascular hypertension, Pt. on medications and Pt. on home beta blockers + CAD, + Past MI, + CABG, + Peripheral Vascular Disease and +CHF Rhythm:Regular Rate:Normal  AV fistula LUE   Neuro/Psych PSYCHIATRIC DISORDERS Anxiety Depression Diabetic Neuropathy Diabetic Retinopathy  Neuromuscular disease    GI/Hepatic Neg liver ROS, hiatal hernia, GERD-  Medicated and Controlled,  Endo/Other  diabetes, Poorly Controlled, Type 1, Insulin DependentHypothyroidism   Renal/GU CRF and DialysisRenal disease   Infected penile prosthesis    Musculoskeletal  (+) Arthritis -, Osteoarthritis,    Abdominal Normal abdominal exam  (+)   Peds  Hematology  (+) anemia ,   Anesthesia Other Findings   Reproductive/Obstetrics                           Anesthesia Physical  Anesthesia Plan  ASA: IV  Anesthesia Plan: General   Post-op Pain Management:    Induction: Intravenous, Rapid sequence and Cricoid pressure planned  Airway Management Planned: Oral ETT  Additional Equipment:   Intra-op Plan:   Post-operative Plan: Extubation in OR  Informed Consent: I have reviewed the patients History and Physical, chart, labs and discussed the procedure including the risks, benefits and alternatives for the proposed anesthesia with the patient or authorized representative who has indicated his/her understanding and acceptance.   Dental advisory given and Dental  Advisory Given  Plan Discussed with: CRNA, Anesthesiologist and Surgeon  Anesthesia Plan Comments:        Anesthesia Quick Evaluation

## 2014-12-14 NOTE — Anesthesia Postprocedure Evaluation (Signed)
  Anesthesia Post-op Note  Patient: Taylor [name]  Procedure(s) Performed: Procedure(s): Bilateral Corporal Irrigation with Cultures and Drainage, Penrose Drain Insertion (Bilateral)  Patient Location: PACU  Anesthesia Type:General  Level of Consciousness: awake, alert  and oriented  Airway and Oxygen Therapy: Patient Spontanous Breathing  Post-op Pain: none  Post-op Assessment: Post-op Vital signs reviewed, Patient's Cardiovascular Status Stable and Respiratory Function Stable  Post-op Vital Signs: Reviewed and stable  Last Vitals:  Filed Vitals:   12/14/14 1314  BP:   Pulse: 66  Temp:   Resp: 11    Complications: No apparent anesthesia complications. Pt was slow to awake.  He was taken to PACU intubated, then extubated about 30 minutes later without problem.

## 2014-12-14 NOTE — Progress Notes (Signed)
Pt bp systollic 95, paged Dr. Ellis Parents and does she want to hold coreg 25 mg po, Dr Susie Cassette called back [facility] to hold coreg 25 mg po.

## 2014-12-14 NOTE — Consult Note (Addendum)
WOC consult requested for daily dressing changes.  Called Dr Patsi Sears to inform him WOC nursing team is not available on the weekends and plan of care was revised for bedside nurses to perform dressing changes daily as ordered.  Physician states he does not require further assistance from the [facility] team.  Pt currently in bed with gauze packing to to groin with mesh underwear in place, small amt bloody drainage on dressing. Discussed plan of care with patient and he denies further questions. Please re-consult if further assistance is needed.  Thank-you,  Cammie Mcgee MSN, RN, CWOCN, [place], CNS [phone]

## 2014-12-14 NOTE — Op Note (Signed)
Pre-operative diagnosis : Corpora cavernosal infection  Postoperative diagnosis:  Same   Operation:  Incision and drainage of left and right corpora cavernosa  Surgeon:  S. Patsi Sears, MD  First assistant:  None  Anesthesia:  General endotracheal  Preparation:  After appropriate preanesthesia, the patient was brought to the operative room, placed on the operating table in the dorsal supine position where general endotracheal anesthesia was introduced. He remained in the supine position. The penis was prepped with Betadine solution and draped in usual fashion. The orbit was double checked. The history was double checked.  Review history:   44 YO diabetic male presents today for bilateral corporotomy and penrose drain placement. Unfortunately, his blood sugars are 800, preventing safe anesthesia. He has been taking Levaquin 500mg  QOD x 10 days. He is s/p removal of IPP by Dr. on 08/23/14. He was last seen on 08/22/14 after he completed ABX 48 hrs ago. SMZ-TMP DS 1 po BID X 10 days. Stated that he continued to have significant penile pain/swelling. Blood sugars had been extremely high which is typical with infections.  He is complaining of pain in his penis, burning with voiding ( dialysis pt), and frequency.   Statement of  Likelihood of Success: Excellent. TIME-OUT observed.:  Procedure: Inspection of the penile glans showed edema, erythema, and drainage. The urethral meatus appeared to be close, and required dilation to a size 14 08/24/14, in order to allow a 14 [place] Foley catheter replaced. All of the patient is a dialysis patient, he does make some urine each day, and the urine recovered. To be infected. This was sent for both aerobic and anaerobic culture. The catheter was placed to straight drainage.  Attention was directed to the left corpora cavernosa, which appeared to be somewhat erythematous, and edematous. With care to avoid injury to the urethra, horizontal incision was  made in the left corpora spongiosum, and following incision, incision was carried to the corporal cavernosa. No bleeding is noted. Using both scissors, and dilators, the dissection was carried to the mid corpora cavernosal level. Pus is encountered just under the glans, and appears to be foul-smelling. The left corporal is drained, and irrigated with approximately 300 cc of normal saline.  A second incision is made with a 10 blade, and the right corpora spongiosum, with dilation into the right corpora cavernosum area again, minimal bleeding is noted. No pus is noted in the right side. Irrigation is also a couple right side. I then elected to place 2 drains, and Penrose drain is placed laterally, with safety pin through each drain separately. Dressing is applied, and mesh pants were applied. The patient received IV antibiotics. He was awakened, taken to recovery room in good condition.

## 2014-12-14 NOTE — Procedures (Signed)
**Note De-Identified Madisan Bice Obfuscation** Extubation Procedure Note  Patient Details:   Name: LEMARIO CHAIKIN DOB: 08/15/1971 MRN: [id number]   Airway Documentation:     Evaluation  O2 sats: stable throughout Complications: No apparent complications Patient did tolerate procedure well. Bilateral Breath Sounds: Clear, Diminished Suctioning: Airway Yes  Grier Czerwinski, Megan Salon 12/14/2014, 1:16 PM

## 2014-12-14 NOTE — Transfer of Care (Signed)
Immediate Anesthesia Transfer of Care Note  Patient: Taylor [name]  Procedure(s) Performed: Procedure(s): Bilateral Corporal Irrigation with Cultures and Drainage, Penrose Drain Insertion (Bilateral)  Patient Location: PACU  Anesthesia Type:General  Level of Consciousness: Patient remains intubated per anesthesia plan  Airway & Oxygen Therapy: Patient remains intubated per anesthesia plan and Patient placed on Ventilator (see vital sign flow sheet for setting)  Post-op Assessment: Report given to RN and Post -op Vital signs reviewed and stable  Post vital signs: Reviewed and stable  Last Vitals:  Filed Vitals:   12/14/14 1011  BP: 125/55  Pulse: 76  Temp: 36.7 C  Resp:     Complications: No apparent anesthesia complications

## 2014-12-14 NOTE — Progress Notes (Signed)
Pt systollic bp 90's since 1230, pt was intubated in PACU, paged Dr. Juanita Craver to hold imdur and catapres

## 2014-12-15 DIAGNOSIS — E038 Other specified hypothyroidism: Secondary | ICD-10-CM

## 2014-12-15 DIAGNOSIS — N4829 Other inflammatory disorders of penis: Secondary | ICD-10-CM

## 2014-12-15 LAB — CBC
HEMATOCRIT: 32.4 % — AB (ref 39.0–52.0)
Hemoglobin: 10.3 g/dL — ABNORMAL LOW (ref 13.0–17.0)
MCH: 28.1 pg (ref 26.0–34.0)
MCHC: 31.8 g/dL (ref 30.0–36.0)
MCV: 88.5 fL (ref 78.0–100.0)
PLATELETS: 82 10*3/uL — AB (ref 150–400)
RBC: 3.66 MIL/uL — ABNORMAL LOW (ref 4.22–5.81)
RDW: 15.3 % (ref 11.5–15.5)
WBC: 7.8 10*3/uL (ref 4.0–10.5)

## 2014-12-15 LAB — GLUCOSE, CAPILLARY
GLUCOSE-CAPILLARY: 117 mg/dL — AB (ref 70–99)
GLUCOSE-CAPILLARY: 127 mg/dL — AB (ref 70–99)
GLUCOSE-CAPILLARY: 150 mg/dL — AB (ref 70–99)
Glucose-Capillary: 117 mg/dL — ABNORMAL HIGH (ref 70–99)
Glucose-Capillary: 379 mg/dL — ABNORMAL HIGH (ref 70–99)
Glucose-Capillary: 66 mg/dL — ABNORMAL LOW (ref 70–99)
Glucose-Capillary: 81 mg/dL (ref 70–99)

## 2014-12-15 LAB — BASIC METABOLIC PANEL
ANION GAP: 10 (ref 5–15)
BUN: 48 mg/dL — AB (ref 6–23)
CALCIUM: 7.5 mg/dL — AB (ref 8.4–10.5)
CHLORIDE: 96 mmol/L (ref 96–112)
CO2: 23 mmol/L (ref 19–32)
CREATININE: 4.99 mg/dL — AB (ref 0.50–1.35)
GFR calc Af Amer: 15 mL/min — ABNORMAL LOW (ref >90)
GFR calc non Af Amer: 13 mL/min — ABNORMAL LOW (ref >90)
GLUCOSE: 189 mg/dL — AB (ref 70–99)
Potassium: 5.8 mmol/L — ABNORMAL HIGH (ref 3.5–5.1)
Sodium: 129 mmol/L — ABNORMAL LOW (ref 135–145)

## 2014-12-15 LAB — CLOSTRIDIUM DIFFICILE BY PCR: Toxigenic C. Difficile by PCR: NEGATIVE

## 2014-12-15 MED ORDER — DOXERCALCIFEROL 4 MCG/2ML IV SOLN
INTRAVENOUS | Status: AC
  Filled 2014-12-15: qty 2

## 2014-12-15 MED ORDER — HYDROCODONE-ACETAMINOPHEN 5-325 MG PO TABS
1.0000 | ORAL_TABLET | ORAL | Status: DC | PRN
  Administered 2014-12-15 – 2014-12-20 (×6): 1 via ORAL
  Filled 2014-12-15 (×6): qty 1

## 2014-12-15 NOTE — Procedures (Signed)
Pt seen on HD.  Ap 170 Vp 240.  BFR 450.  6KG above DW?

## 2014-12-15 NOTE — Progress Notes (Signed)
[facility] PROGRESS NOTE  Taylor [name] JHE:[id number] DOB: December 28, 1970 DOA: 12/10/2014 PCP: [name], MD  Assessment/Plan: Hyperosmolar non ketotic hyperglycemia - likely as a result of below mentioned infection - Sugars improved while the patient was on the insulin drip, sugars trending back up again slowly.  - Hemoglobin A1c 15.4 Continue Lantus 40 units starting tomorrow. 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 Continue IV Primaxin/continue vancomycin given persistent drainage. Prior to admission patient was on Levaquin 500mg  QOD x 10 days. He is s/p removal of IPP by Dr. on 08/23/14. Recently was also on SMZ-TMP DS 1 po BID X 10 days.  - s/p I and D  2/5 - cont Vanc and Primaxin - Follow culture data from the wound, taper antibiotics per urology recommendations  Diarrhea Continue florastor , no diarrhea for 1-1/2 days. Now constipated DC contact precautions for C. difficile   End-stage renal disease on hemodialysis Nephrology following. dialyzed 3 times a week on Tuesday, Thursday, Saturday, in [place].   Hyponatremia Likely multifactorial secondary to hyperglycemia  Monitor levels during hemodialysis  Uinary tract infection? Urine culture from 2/2 negative. Patient on IV Primaxin and Vanc  Hypertension Continue home regimen of clonidine, Coreg, Imdur.  Hypothyroidism Continue Synthroid.  Depression Continue Paxil, Celexa, Lyrica.  Hypokalemia Patient hemodialysis. Per renal.  #10 prophylaxis Heparin for DVT prophylaxis.  Code Status: Full Family Communication: No family at bedside. Disposition Plan: f/u on cultures to determine antibiotics on d/c   Consultants:  Neurology: Dr. Derby 12/10/2014  Nephrology: Dr. 02/08/2015  12/11/2014  Procedures:  None  Antibiotics:  IV Primaxin 12/10/14  Vancomycin> 2/3  HPI/Subjective: evaluated patient while he was on dialysis- no complaints.      Objective: Filed Vitals:   12/15/14 1350  BP: 107/64  Pulse: 72  Temp: 98 F (36.7 C)  Resp: 18    Intake/Output Summary (Last 24 hours) at 12/15/14 1440 Last data filed at 12/15/14 1350  Gross per 24 hour  Intake    440 ml  Output   5300 ml  Net  -4860 ml   Filed Weights   12/14/14 2043 12/15/14 1019 12/15/14 1350  Weight: 85.5 kg (188 lb 7.9 oz) 83.2 kg (183 lb 6.8 oz) 77.7 kg (171 lb 4.8 oz)    Exam:  02/13/15 and alert, no acute distress CVS:RRR Resp:clear to auscultation b/l  Abd:+BS NT, ND Ext: No edema LUA AVG + bruit NEURO:CNI Ox3 no asterixis  Data Reviewed: Basic Metabolic Panel:  Recent Labs Lab 12/11/14 0227 12/11/14 1032 12/12/14 0930 12/14/14 0530 12/15/14 1038  NA 127* 128* 132* 134* 129*  K 3.9 3.2* 3.9 4.4 5.8*  CL 94* 96 97 98 96  CO2 24 24 24 26 23   GLUCOSE 213* 290* 309* 176* 189*  BUN 59* 37* 31* 29* 48*  CREATININE 5.18* 3.62* 4.18* 3.86* 4.99*  CALCIUM 8.5 8.6 7.9* 7.7* 7.5*  PHOS  --  3.6 4.3  --   --    Liver Function Tests:  Recent Labs Lab 12/10/14 0950 12/11/14 0227 12/12/14 0930 12/14/14 0530  AST 24 24  --  22  ALT 25 19  --  13  ALKPHOS 299* 246*  --  233*  BILITOT 1.0 0.9  --  0.7  PROT 7.4 5.8*  --  6.1  ALBUMIN 3.1* 2.4* 2.4*  2.4*   No results for input(s): LIPASE, AMYLASE in the last 168 hours. No results for input(s): AMMONIA in the last 168 hours. CBC:  Recent Labs Lab 12/10/14 0950 12/10/14 1927 12/11/14 0227 12/12/14 0930 12/14/14 0530 12/15/14 1031  WBC 7.3 7.3 5.4 5.2 4.6 7.8  NEUTROABS 6.1  --   --  4.0  --   --   HGB 11.8* 11.3* 10.1* 10.1* 10.1* 10.3*  HCT 36.8* 34.5* 30.5* 31.4* 32.2* 32.4*  MCV 90.6 87.1 84.7 88.7 89.0 88.5  PLT 100* 114* 98* 95* 79* 82*   Cardiac Enzymes: No results for input(s): CKTOTAL,  CKMB, CKMBINDEX, TROPONINI in the last 168 hours. BNP (last 3 results) No results for input(s): BNP in the last 8760 hours.  ProBNP (last 3 results)  Recent Labs  03/26/14 1555  PROBNP 1212.0*    CBG:  Recent Labs Lab 12/14/14 1644 12/14/14 2042 12/15/14 0001 12/15/14 0327 12/15/14 0810  GLUCAP 158* 167* 150* 117* 379*    Recent Results (from the past 240 hour(s))  Culture, blood (routine x 2)     Status: None (Preliminary result)   Collection Time: 12/10/14  7:27 PM  Result Value Ref Range Status   Specimen Description BLOOD RIGHT ARM  Final   Special Requests BOTTLES DRAWN AEROBIC AND ANAEROBIC 5CC  Final   Culture   Final           BLOOD CULTURE RECEIVED NO GROWTH TO DATE CULTURE WILL BE HELD FOR 5 DAYS BEFORE ISSUING A FINAL NEGATIVE REPORT Performed at [facility]    Report Status PENDING  Incomplete  Culture, blood (routine x 2)     Status: None (Preliminary result)   Collection Time: 12/10/14  7:54 PM  Result Value Ref Range Status   Specimen Description BLOOD RIGHT HAND  Final   Special Requests BOTTLES DRAWN AEROBIC ONLY 1CC  Final   Culture   Final           BLOOD CULTURE RECEIVED NO GROWTH TO DATE CULTURE WILL BE HELD FOR 5 DAYS BEFORE ISSUING A FINAL NEGATIVE REPORT Note: Culture results may be compromised due to an inadequate volume of blood received in culture bottles. Performed at [facility]    Report Status PENDING  Incomplete  MRSA PCR Screening     Status: None   Collection Time: 12/11/14  4:35 AM  Result Value Ref Range Status   MRSA by PCR NEGATIVE NEGATIVE Final    Comment:        The GeneXpert MRSA Assay (FDA approved for NASAL specimens only), is one component of a comprehensive MRSA colonization surveillance program. It is not intended to diagnose MRSA infection nor to guide or monitor treatment for MRSA infections.   Anaerobic culture     Status: None (Preliminary result)   Collection Time: 12/14/14 11:45 AM   Result Value Ref Range Status   Specimen Description WOUND PENIS  Final   Special Requests NONE  Final   Gram Stain   Final    RARE WBC PRESENT,BOTH PMN AND MONONUCLEAR NO SQUAMOUS EPITHELIAL CELLS SEEN NO ORGANISMS SEEN Performed at [facility]    Culture   Final    NO ANAEROBES ISOLATED; CULTURE IN PROGRESS FOR 5 DAYS Performed at [facility]    Report Status PENDING  Incomplete  Wound culture     Status: None (Preliminary result)   Collection Time: 12/14/14 11:45 AM  Result Value Ref Range Status   Specimen Description  WOUND PENIS  Final   Special Requests NONE  Final   Gram Stain   Final    RARE WBC PRESENT,BOTH PMN AND MONONUCLEAR NO SQUAMOUS EPITHELIAL CELLS SEEN NO ORGANISMS SEEN Performed at [facility]    Culture   Final    NO GROWTH 1 DAY Performed at [facility]    Report Status PENDING  Incomplete     Studies: No results found.  Scheduled Meds: . amitriptyline  25 mg Oral QHS  . aspirin  81 mg Oral q morning - 10a  . carvedilol  25 mg Oral BID WC  . cinacalcet  60 mg Oral Q supper  . citalopram  20 mg Oral Daily  . cloNIDine  0.1 mg Oral BID  . [START ON 12/18/2014] Darbepoetin Alfa  40 mcg Intravenous Q7 days  . doxercalciferol      . doxercalciferol  1.5 mcg Intravenous Q T,Th,Sa-HD  . imipenem-cilastatin  250 mg Intravenous Q6H  . insulin aspart  0-9 Units Subcutaneous TID WC  . insulin aspart  5 Units Subcutaneous TID WC  . insulin glargine  40 Units Subcutaneous Daily  . isosorbide mononitrate  30 mg Oral BID  . levothyroxine  75 mcg Oral QAC breakfast  . multivitamin  1 tablet Oral QHS  . PARoxetine  20 mg Oral q morning - 10a  . pregabalin  150 mg Oral BID  . rosuvastatin  20 mg Oral QHS  . saccharomyces boulardii  250 mg Oral BID  . sevelamer carbonate  1,600 mg Oral TID WC  . vancomycin  1,000 mg Intravenous Q T,Th,Sa-HD   Continuous Infusions:    Time spent: 30 MINS   Tryphena Perkovich MD [facility] Pager -  [email or web address], password [facility] 12/15/2014, 2:40 PM  LOS: 5 days

## 2014-12-15 NOTE — Progress Notes (Signed)
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 Underwent operative I+D / corporal drain placement 12/14/2014 by Tannenbaum. WCX from I/D pending. On empiric Primaxin + Vancomycin.  Today Taylor [name] is w/o complaints. No new penile pain or wound problems.   Objective: Vital signs in last 24 hours: Temp:  [97.7 F (36.5 C)-98.5 F (36.9 C)] 98.4 F (36.9 C) (02/06 0429) Pulse Rate:  [56-78] 78 (02/06 0429) Resp:  [10-22] 17 (02/06 0429) BP: (90-125)/(55-60) 98/59 mmHg (02/06 0429) SpO2:  [89 %-100 %] 100 % (02/06 0429) FiO2 (%):  [40 %] 40 % (02/05 1233) Weight:  [85.5 kg (188 lb 7.9 oz)] 85.5 kg (188 lb 7.9 oz) (02/05 2043) Last BM Date: 12/12/14  Intake/Output from previous day: 02/05 0701 - 02/06 0700 In: 550 [P.O.:200; I.V.:350] Out: 50 [Urine:50] Intake/Output this shift:    General appearance: alert, cooperative and appears stated age Head: Normocephalic, without obvious abnormality, atraumatic Nose: Nares normal. Septum midline. Mucosa normal. No drainage or sinus tenderness. Throat: lips, mucosa, and tongue normal; teeth and gums normal Neck: supple, symmetrical, trachea midline Back: symmetric, no curvature. ROM normal. No CVA tenderness. Resp: non-labored on room air. Cardio: Nl rate GI: soft, non-tender; bowel sounds normal; no masses,  no organomegaly Extremities: extremities normal, atraumatic, no cyanosis or edema Pulses: 2+ and symmetric Skin: Skin color, texture, turgor normal. No rashes or lesions Lymph nodes: Cervical, supraclavicular, and axillary nodes normal. Neurologic: Grossly normal  Lab Results:   Recent Labs  12/14/14 0530  WBC 4.6  HGB 10.1*  HCT 32.2*  PLT 79*   BMET  Recent Labs  12/14/14 0530  NA 134*  K 4.4  CL 98  CO2 26  GLUCOSE 176*  BUN 29*  CREATININE 3.86*  CALCIUM 7.7*   PT/INR No results for input(s): LABPROT, INR in the last 72  hours. ABG No results for input(s): PHART, HCO3 in the last 72 hours.  Invalid input(s): PCO2, PO2  Studies/Results: No results found.  Anti-infectives: Anti-infectives    Start     Dose/Rate Route Frequency Ordered Stop   12/14/14 0900  gentamicin (GARAMYCIN) 160 mg in dextrose 5 % 50 mL IVPB     160 mg104 mL/hr over 60 Minutes Intravenous 30 min pre-op 12/13/14 1515 12/14/14 1130   12/14/14 0900  clindamycin (CLEOCIN) IVPB 900 mg     900 mg100 mL/hr over 30 Minutes Intravenous 30 min pre-op 12/13/14 1518 12/14/14 1125   12/13/14 1439  clindamycin (CLEOCIN) IVPB 900 mg  Status:  Discontinued     900 mg100 mL/hr over 30 Minutes Intravenous 30 min pre-op 12/13/14 1439 12/13/14 1518   12/13/14 1439  gentamicin (GARAMYCIN) 400 mg in dextrose 5 % 100 mL IVPB  Status:  Discontinued     5 mg/kg  79.9 kg110 mL/hr over 60 Minutes Intravenous 30 min pre-op 12/13/14 1439 12/13/14 1515   12/13/14 1200  vancomycin (VANCOCIN) IVPB 1000 mg/200 mL premix     1,000 mg200 mL/hr over 60 Minutes Intravenous Every T-Th-Sa (Hemodialysis) 12/12/14 1259     12/12/14 1000  vancomycin (VANCOCIN) 1,500 mg in sodium chloride 0.9 % 250 mL IVPB     1,500 mg250 mL/hr over 60 Minutes Intravenous  Once 12/12/14 0905 12/12/14 1208   12/10/14 1400  imipenem-cilastatin (PRIMAXIN) 250 mg in sodium chloride 0.9 % 100 mL IVPB     250 mg200 mL/hr over 30 Minutes Intravenous Every 6 hours 12/10/14 1312  Assessment/Plan:  1 - Penile Infection - doing well s/p I/D. Agree with current ABX pending wound CX.   Keep current drains.  Will follow, call with questions.   [facility], Addilynne Olheiser 12/15/2014

## 2014-12-16 DIAGNOSIS — E1022 Type 1 diabetes mellitus with diabetic chronic kidney disease: Secondary | ICD-10-CM

## 2014-12-16 LAB — BASIC METABOLIC PANEL
Anion gap: 11 (ref 5–15)
BUN: 31 mg/dL — AB (ref 6–23)
CALCIUM: 7.7 mg/dL — AB (ref 8.4–10.5)
CO2: 28 mmol/L (ref 19–32)
Chloride: 93 mmol/L — ABNORMAL LOW (ref 96–112)
Creatinine, Ser: 4.13 mg/dL — ABNORMAL HIGH (ref 0.50–1.35)
GFR calc non Af Amer: 16 mL/min — ABNORMAL LOW (ref >90)
GFR, EST AFRICAN AMERICAN: 19 mL/min — AB (ref >90)
Glucose, Bld: 259 mg/dL — ABNORMAL HIGH (ref 70–99)
Potassium: 5.1 mmol/L (ref 3.5–5.1)
SODIUM: 132 mmol/L — AB (ref 135–145)

## 2014-12-16 LAB — WOUND CULTURE

## 2014-12-16 LAB — GLUCOSE, CAPILLARY
GLUCOSE-CAPILLARY: 85 mg/dL (ref 70–99)
GLUCOSE-CAPILLARY: 145 mg/dL — AB (ref 70–99)
Glucose-Capillary: 102 mg/dL — ABNORMAL HIGH (ref 70–99)
Glucose-Capillary: 29 mg/dL — CL (ref 70–99)
Glucose-Capillary: 144 mg/dL — ABNORMAL HIGH (ref 70–99)
Glucose-Capillary: 249 mg/dL — ABNORMAL HIGH (ref 70–99)
Glucose-Capillary: 209 mg/dL — ABNORMAL HIGH (ref 70–99)

## 2014-12-16 MED ORDER — DEXTROSE 50 % IV SOLN
1.0000 | Freq: Once | INTRAVENOUS | Status: AC
  Administered 2014-12-16: 50 mL via INTRAVENOUS

## 2014-12-16 MED ORDER — INSULIN GLARGINE 100 UNIT/ML ~~LOC~~ SOLN
35.0000 [IU] | Freq: Every day | SUBCUTANEOUS | Status: DC
  Filled 2014-12-16: qty 0.35

## 2014-12-16 MED ORDER — DEXTROSE 50 % IV SOLN
INTRAVENOUS | Status: AC
  Administered 2014-12-16: 50 mL via INTRAVENOUS
  Filled 2014-12-16: qty 50

## 2014-12-16 MED ORDER — INSULIN GLARGINE 100 UNIT/ML ~~LOC~~ SOLN
25.0000 [IU] | Freq: Every day | SUBCUTANEOUS | Status: DC
  Filled 2014-12-16: qty 0.25

## 2014-12-16 MED ORDER — INSULIN GLARGINE 100 UNIT/ML ~~LOC~~ SOLN
30.0000 [IU] | Freq: Every day | SUBCUTANEOUS | Status: DC
  Administered 2014-12-16 – 2014-12-21 (×6): 30 [IU] via SUBCUTANEOUS
  Filled 2014-12-16 (×6): qty 0.3

## 2014-12-16 NOTE — Progress Notes (Signed)
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 Underwent operative I+D / corporal drain placement 12/14/2014 by Tannenbaum. WCX from I/D no growth to date. On empiric Primaxin + Vancomycin.  Today Yogesh is without compliants. His corporeal drain are now out having ben disloged while in the chair yesterday. One and its safety pin is clearly recovered. No interval fevers. Having some serosanguinous corporeal drainage as expected.   Objective: Vital signs in last 24 hours: Temp:  [97.7 F (36.5 C)-99.8 F (37.7 C)] 97.7 F (36.5 C) (02/07 0444) Pulse Rate:  [66-88] 74 (02/07 0516) Resp:  [16-18] 16 (02/07 0444) BP: (94-183)/(48-84) 105/62 mmHg (02/07 0516) SpO2:  [96 %-100 %] 100 % (02/07 0444) Weight:  [77.7 kg (171 lb 4.8 oz)-83.2 kg (183 lb 6.8 oz)] 82.3 kg (181 lb 7 oz) (02/06 2012) Last BM Date: 12/12/14  Intake/Output from previous day: 02/06 0701 - 02/07 0700 In: 720 [P.O.:720] Out: 5402 [Urine:100; Stool:2] Intake/Output this shift:    General appearance: alert, cooperative and appears older than stated age Eyes: negative Nose: Nares normal. Septum midline. Mucosa normal. No drainage or sinus tenderness. Throat: lips, mucosa, and tongue normal; teeth and gums normal Neck: supple, symmetrical, trachea midline Back: symmetric, no curvature. ROM normal. No CVA tenderness. Resp: non-labored on room air Cardio: Nl rate GI: soft, non-tender; bowel sounds normal; no masses,  no organomegaly Male genitalia: stable corporeal induration ffrom perineum to glans. Coropreal inciion patent and serous fluid milked that is non-purlulent. No drains present. Dry dressing applied.  Extremities: extremities normal, atraumatic, no cyanosis or edema Pulses: 2+ and symmetric Skin: Skin color, texture, turgor normal. No rashes or lesions Lymph nodes: Cervical, supraclavicular, and axillary nodes normal. Neurologic:  Grossly normal Incision/Wound: as per above. Foley c/d/i with light brown / dark yellow urine.   Lab Results:   Recent Labs  12/14/14 0530 12/15/14 1031  WBC 4.6 7.8  HGB 10.1* 10.3*  HCT 32.2* 32.4*  PLT 79* 82*   BMET  Recent Labs  12/14/14 0530 12/15/14 1038  NA 134* 129*  K 4.4 5.8*  CL 98 96  CO2 26 23  GLUCOSE 176* 189*  BUN 29* 48*  CREATININE 3.86* 4.99*  CALCIUM 7.7* 7.5*   PT/INR No results for input(s): LABPROT, INR in the last 72 hours. ABG No results for input(s): PHART, HCO3 in the last 72 hours.  Invalid input(s): PCO2, PO2  Studies/Results: No results found.  Anti-infectives: Anti-infectives    Start     Dose/Rate Route Frequency Ordered Stop   12/14/14 0900  gentamicin (GARAMYCIN) 160 mg in dextrose 5 % 50 mL IVPB     160 mg104 mL/hr over 60 Minutes Intravenous 30 min pre-op 12/13/14 1515 12/14/14 1130   12/14/14 0900  clindamycin (CLEOCIN) IVPB 900 mg     900 mg100 mL/hr over 30 Minutes Intravenous 30 min pre-op 12/13/14 1518 12/14/14 1125   12/13/14 1439  clindamycin (CLEOCIN) IVPB 900 mg  Status:  Discontinued     900 mg100 mL/hr over 30 Minutes Intravenous 30 min pre-op 12/13/14 1439 12/13/14 1518   12/13/14 1439  gentamicin (GARAMYCIN) 400 mg in dextrose 5 % 100 mL IVPB  Status:  Discontinued     5 mg/kg  79.9 kg110 mL/hr over 60 Minutes Intravenous 30 min pre-op 12/13/14 1439 12/13/14 1515   12/13/14 1200  vancomycin (VANCOCIN) IVPB 1000 mg/200 mL premix     1,000 mg200 mL/hr over 60  Minutes Intravenous Every T-Th-Sa (Hemodialysis) 12/12/14 1259     12/12/14 1000  vancomycin (VANCOCIN) 1,500 mg in sodium chloride 0.9 % 250 mL IVPB     1,500 mg250 mL/hr over 60 Minutes Intravenous  Once 12/12/14 0905 12/12/14 1208   12/10/14 1400  imipenem-cilastatin (PRIMAXIN) 250 mg in sodium chloride 0.9 % 100 mL IVPB     250 mg200 mL/hr over 30 Minutes Intravenous Every 6 hours 12/10/14 1312        Assessment/Plan:  1 - Penile Infection - doing  well s/p I/D. Drains now out but tracts patent which is good as we want residual corporeal fluid to drain. One drain recovered, NSG will help look out for other to document complete removal.  Rec keep on ABX until [employer] final, hopefully was sterile abscess / fluid.   Will follow, call with questions.   [facility], Khush Pasion 12/16/2014

## 2014-12-16 NOTE — Progress Notes (Signed)
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  -  Prior to admission patient was on Levaquin 500mg  QOD x 10 days. He is s/p removal of IPP by Dr. on 08/23/14. Recently was also on SMZ-TMP DS 1 po BID X 10 days.  - s/p I and D  2/5 - cont Vanc and Primaxin - Follow culture data from the wound, taper antibiotics per urology recommendations  Diarrhea Continue florastor , diarrhea resolved  DC contact precautions for C. difficile   End-stage renal disease on hemodialysis Nephrology following. dialyzed 3 times a week on Tuesday, Thursday, Saturday, in [place].   Hyponatremia- hyperkalemia - management with hemodialysis  Uinary tract infection? Urine culture from 2/2 negative although UA reveals pyuria. Patient on IV Primaxin and Vanc  Hypertension Continue home regimen of clonidine, Coreg, Imdur.  Hypothyroidism Continue Synthroid.  Depression Continue Paxil, Celexa, Lyrica.   Code Status: Full Family Communication: No family at bedside. Disposition Plan: f/u on cultures to determine antibiotics on d/c   Consultants:  Neurology: Dr.  Derby 12/10/2014  Nephrology: Dr. 02/08/2015 12/11/2014  Procedures:  None  Antibiotics:  IV Primaxin 12/10/14  Vancomycin> 2/3  HPI/Subjective: evaluated patient this morning-  no complaints.      Objective: Filed Vitals:   12/16/14 0516  BP: 105/62  Pulse: 74  Temp:   Resp:     Intake/Output Summary (Last 24 hours) at 12/16/14 1024 Last data filed at 12/16/14 0856  Gross per 24 hour  Intake    840 ml  Output   5402 ml  Net  -4562 ml   Filed Weights   12/15/14 1019 12/15/14 1350 12/15/14 2012  Weight: 83.2 kg (183 lb 6.8 oz) 77.7 kg (171 lb 4.8 oz) 82.3 kg (181 lb 7 oz)    Exam:  02/13/15 and alert, no acute distress CVS:RRR Resp:clear to auscultation b/l  Abd:+BS NT, ND Ext: No edema LUA AVG + bruit NEURO:CNI Ox3 no asterixis  Data Reviewed: Basic Metabolic Panel:  Recent Labs Lab 12/11/14 0227 12/11/14 1032 12/12/14 0930 12/14/14 0530 12/15/14 1038  NA 127* 128* 132* 134* 129*  K 3.9 3.2* 3.9 4.4 5.8*  CL 94* 96 97 98 96  CO2 24 24 24 26 23   GLUCOSE 213* 290* 309* 176* 189*  BUN 59* 37* 31* 29* 48*  CREATININE 5.18* 3.62* 4.18* 3.86* 4.99*  CALCIUM 8.5 8.6 7.9* 7.7* 7.5*  PHOS  --  3.6 4.3  --   --    Liver Function Tests:  Recent Labs Lab 12/10/14 0950 12/11/14 0227 12/12/14 0930 12/14/14 0530  AST 24 24  --  22  ALT 25 19  --  13  ALKPHOS 299* 246*  --  233*  BILITOT 1.0 0.9  --  0.7  PROT 7.4 5.8*  --  6.1  ALBUMIN 3.1* 2.4* 2.4* 2.4*   No results for input(s): LIPASE, AMYLASE in the last 168 hours. No results for input(s): AMMONIA in the last 168 hours. CBC:  Recent Labs Lab 12/10/14 0950 12/10/14 1927 12/11/14 0227 12/12/14 0930 12/14/14 0530 12/15/14 1031  WBC 7.3 7.3 5.4 5.2 4.6 7.8  NEUTROABS 6.1  --   --  4.0  --   --   HGB 11.8* 11.3* 10.1* 10.1* 10.1* 10.3*  HCT 36.8* 34.5* 30.5* 31.4* 32.2* 32.4*  MCV 90.6 87.1 84.7 88.7 89.0 88.5  PLT 100* 114* 98* 95* 79* 82*   Cardiac Enzymes: No results for  input(s): CKTOTAL, CKMB, CKMBINDEX, TROPONINI in the last 168 hours. BNP (last 3 results) No results for input(s): BNP in the last 8760 hours.  ProBNP (last 3 results)  Recent Labs  03/26/14 1555  PROBNP 1212.0*    CBG:  Recent Labs Lab 12/15/14 2011 12/16/14 0112 12/16/14 0442 12/16/14 0513 12/16/14 0827  GLUCAP 117* 85 29* 102* 144*    Recent Results (from the past 240 hour(s))  Culture, blood (routine x 2)     Status: None (Preliminary result)   Collection Time: 12/10/14  7:27 PM  Result Value Ref Range Status   Specimen Description BLOOD RIGHT ARM  Final   Special Requests BOTTLES DRAWN AEROBIC AND ANAEROBIC 5CC  Final   Culture   Final           BLOOD CULTURE RECEIVED NO GROWTH TO DATE CULTURE WILL BE HELD FOR 5 DAYS BEFORE ISSUING A FINAL NEGATIVE REPORT Performed at [facility]    Report Status PENDING  Incomplete  Culture, blood (routine x 2)     Status: None (Preliminary result)   Collection Time: 12/10/14  7:54 PM  Result Value Ref Range Status   Specimen Description BLOOD RIGHT HAND  Final   Special Requests BOTTLES DRAWN AEROBIC ONLY 1CC  Final   Culture   Final           BLOOD CULTURE RECEIVED NO GROWTH TO DATE CULTURE WILL BE HELD FOR 5 DAYS BEFORE ISSUING A FINAL NEGATIVE REPORT Note: Culture results may be compromised due to an inadequate volume of blood received in culture bottles. Performed at [facility]    Report Status PENDING  Incomplete  MRSA PCR Screening     Status: None   Collection Time: 12/11/14  4:35 AM  Result Value Ref Range Status   MRSA by PCR NEGATIVE NEGATIVE Final    Comment:        The GeneXpert MRSA Assay (FDA approved for NASAL specimens only), is one component of a comprehensive MRSA colonization surveillance program. It is not intended to diagnose MRSA infection nor to guide or monitor treatment for MRSA infections.   Anaerobic culture     Status: None (Preliminary result)   Collection Time:  12/14/14 11:45 AM  Result Value Ref Range Status   Specimen Description WOUND PENIS  Final   Special Requests NONE  Final   Gram Stain   Final    RARE WBC PRESENT,BOTH PMN AND MONONUCLEAR NO SQUAMOUS EPITHELIAL CELLS SEEN NO ORGANISMS SEEN Performed at [facility]    Culture   Final    NO ANAEROBES ISOLATED; CULTURE IN PROGRESS FOR 5 DAYS Performed at [facility]    Report Status PENDING  Incomplete  Wound culture     Status: None   Collection Time: 12/14/14 11:45 AM  Result Value Ref Range Status   Specimen Description WOUND PENIS  Final   Special Requests NONE  Final   Gram Stain   Final    RARE WBC PRESENT,BOTH PMN AND MONONUCLEAR NO SQUAMOUS EPITHELIAL CELLS SEEN NO ORGANISMS SEEN Performed at [facility]    Culture   Final    FEW CANDIDA ALBICANS Performed at [facility]    Report Status 12/16/2014 FINAL  Final  Clostridium Difficile by PCR     Status: None   Collection Time: 12/15/14 11:49 AM  Result Value Ref Range Status   C difficile by pcr NEGATIVE NEGATIVE Final     Studies: No results found.  Scheduled Meds: . amitriptyline  25 mg Oral QHS  . aspirin  81 mg Oral q morning - 10a  . carvedilol  25 mg Oral BID WC  . cinacalcet  60 mg Oral Q supper  . citalopram  20 mg Oral Daily  . cloNIDine  0.1 mg Oral BID  . [START ON 12/18/2014] Darbepoetin Alfa  40 mcg Intravenous Q7 days  . doxercalciferol  1.5 mcg Intravenous Q T,Th,Sa-HD  . imipenem-cilastatin  250 mg Intravenous Q6H  . insulin aspart  0-9 Units Subcutaneous TID WC  . insulin aspart  5 Units Subcutaneous TID WC  . insulin glargine  25 Units Subcutaneous Daily  . isosorbide mononitrate  30 mg Oral BID  . levothyroxine  75 mcg Oral QAC breakfast  . multivitamin  1 tablet Oral QHS  . PARoxetine  20 mg Oral q morning - 10a  . pregabalin  150 mg Oral BID  . rosuvastatin  20 mg Oral QHS  . saccharomyces boulardii  250 mg Oral BID  . sevelamer carbonate  1,600 mg  Oral TID WC  . vancomycin  1,000 mg Intravenous Q T,Th,Sa-HD   Continuous Infusions:    Time spent: 30 MINS   Kamdyn Covel MD [facility] Pager -  [email or web address], password [facility] 12/16/2014, 10:24 AM  LOS: 6 days

## 2014-12-16 NOTE — Progress Notes (Signed)
  Hypoglycemic Event  CBG: 29 Treatment: 1 amp D50 IVP Symptoms: Lethargy, diaphoretic Follow-up CBG: 102 Possible Reasons for Event: Unknown

## 2014-12-16 NOTE — Progress Notes (Signed)
S: No new CO O:BP 105/62 mmHg  Pulse 74  Temp(Src) 97.7 F (36.5 C) (Oral)  Resp 16  Ht 6\' 3"  (1.905 m)  Wt 82.3 kg (181 lb 7 oz)  BMI 22.68 kg/m2  SpO2 100%  Intake/Output Summary (Last 24 hours) at 12/16/14 0847 Last data filed at 12/16/14 0533  Gross per 24 hour  Intake    720 ml  Output   5402 ml  Net  -4682 ml   Weight change: -2.3 kg (-5 lb 1.1 oz) 02/14/15 and alert CVS:RRR Resp:clear Abd:+BS NTND Ext: No edema  LUA AVG + bruit NEURO:CNI Ox3 no asterixis   . amitriptyline  25 mg Oral QHS  . aspirin  81 mg Oral q morning - 10a  . carvedilol  25 mg Oral BID WC  . cinacalcet  60 mg Oral Q supper  . citalopram  20 mg Oral Daily  . cloNIDine  0.1 mg Oral BID  . [START ON 12/18/2014] Darbepoetin Alfa  40 mcg Intravenous Q7 days  . doxercalciferol  1.5 mcg Intravenous Q T,Th,Sa-HD  . imipenem-cilastatin  250 mg Intravenous Q6H  . insulin aspart  0-9 Units Subcutaneous TID WC  . insulin aspart  5 Units Subcutaneous TID WC  . insulin glargine  25 Units Subcutaneous Daily  . isosorbide mononitrate  30 mg Oral BID  . levothyroxine  75 mcg Oral QAC breakfast  . multivitamin  1 tablet Oral QHS  . PARoxetine  20 mg Oral q morning - 10a  . pregabalin  150 mg Oral BID  . rosuvastatin  20 mg Oral QHS  . saccharomyces boulardii  250 mg Oral BID  . sevelamer carbonate  1,600 mg Oral TID WC  . vancomycin  1,000 mg Intravenous Q T,Th,Sa-HD   No results found. BMET    Component Value Date/Time   NA 129* 12/15/2014 1038   NA 134 12/05/2012 1419   K 5.8* 12/15/2014 1038   CL 96 12/15/2014 1038   CO2 23 12/15/2014 1038   GLUCOSE 189* 12/15/2014 1038   GLUCOSE 575* 12/05/2012 1419   BUN 48* 12/15/2014 1038   BUN 44* 12/05/2012 1419   CREATININE 4.99* 12/15/2014 1038   CALCIUM 7.5* 12/15/2014 1038   GFRNONAA 13* 12/15/2014 1038   GFRAA 15* 12/15/2014 1038   CBC    Component Value Date/Time   WBC 7.8 12/15/2014 1031   WBC 6.2 12/05/2012 1419   RBC 3.66* 12/15/2014  1031   RBC 3.93* 12/05/2012 1419   HGB 10.3* 12/15/2014 1031   HCT 32.4* 12/15/2014 1031   PLT 82* 12/15/2014 1031   MCV 88.5 12/15/2014 1031   MCH 28.1 12/15/2014 1031   MCH 30.3 12/05/2012 1419   MCHC 31.8 12/15/2014 1031   MCHC 32.2 12/05/2012 1419   RDW 15.3 12/15/2014 1031   RDW 14.2 12/05/2012 1419   LYMPHSABS 0.7 12/12/2014 0930   LYMPHSABS 0.9 12/05/2012 1419   MONOABS 0.4 12/12/2014 0930   EOSABS 0.1 12/12/2014 0930   EOSABS 0.2 12/05/2012 1419   BASOSABS 0.0 12/12/2014 0930   BASOSABS 0.0 12/05/2012 1419     Assessment: 1. ESRD 2. 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 Anemia on aranesp 4. HTN 5. DM 6. Scrotal abscess/penile infection  Plan: 1.  HD tuesday    Nevaeha Finerty T

## 2014-12-17 ENCOUNTER — Encounter (HOSPITAL_COMMUNITY): Payer: Self-pay | Admitting: Urology

## 2014-12-17 LAB — CULTURE, BLOOD (ROUTINE X 2)
CULTURE: NO GROWTH
Culture: NO GROWTH

## 2014-12-17 LAB — GLUCOSE, CAPILLARY: Glucose-Capillary: 169 mg/dL — ABNORMAL HIGH (ref 70–99)

## 2014-12-17 MED ORDER — CALCIUM CARBONATE ANTACID 500 MG PO CHEW
1.0000 | CHEWABLE_TABLET | Freq: Four times a day (QID) | ORAL | Status: DC | PRN
  Administered 2014-12-18: 400 mg via ORAL
  Filled 2014-12-17: qty 1

## 2014-12-17 MED ORDER — INSULIN ASPART 100 UNIT/ML ~~LOC~~ SOLN
7.0000 [IU] | Freq: Three times a day (TID) | SUBCUTANEOUS | Status: DC
  Administered 2014-12-17 – 2014-12-21 (×10): 7 [IU] via SUBCUTANEOUS

## 2014-12-17 MED ORDER — SODIUM CHLORIDE 0.9 % IV SOLN
250.0000 mg | Freq: Two times a day (BID) | INTRAVENOUS | Status: DC
  Administered 2014-12-17 – 2014-12-21 (×8): 250 mg via INTRAVENOUS
  Filled 2014-12-17 (×9): qty 250

## 2014-12-17 MED ORDER — ACETIC ACID 0.25 % IR SOLN
Freq: Two times a day (BID) | Status: DC
  Administered 2014-12-17 – 2014-12-21 (×9)
  Filled 2014-12-17 (×4): qty 1000

## 2014-12-17 NOTE — Progress Notes (Signed)
Inpatient Diabetes Program Recommendations  AACE/ADA: New Consensus Statement on Inpatient Glycemic Control (2013)  Target Ranges:  Prepandial:   less than 140 mg/dL      Peak postprandial:   less than 180 mg/dL (1-2 hours)      Critically ill patients:  140 - 180 mg/dL   Results for Taylor [name], Taylor [name] (MRN [id number]) as of 12/17/2014 09:21  Ref. Range 12/16/2014 04:42 12/16/2014 05:13 12/16/2014 08:27 12/16/2014 11:54 12/16/2014 16:54 12/16/2014 20:18 12/17/2014 00:44  Glucose-Capillary Latest Range: 70-99 mg/dL 29 (LL) 160 (H) 737 (H) 249 (H) 209 (H) 145 (H) 169 (H)    Current orders for Inpatient glycemic control: Lantus 30 units daily, Novolog 0-9 units TID with meals, Novolog 5 units TID with meals for meal coverage  Inpatient Diabetes Program Recommendations Insulin - Basal: Noted Lantus was decreased to 30 units daily which patient received on 12/16/14 and fasting glucose is 140 mg/dl today. Insulin - Meal Coverage: Post prandial glucose is consistently elevated despite ordered meal coverage. Please consider increasing meal coverage to Novolog 7 units TID with meals.  Thanks, Orlando Penner, RN, MSN, CCRN, CDE Diabetes Coordinator Inpatient Diabetes Program [phone] (Team Pager) [phone] ([facility] office) [phone] [facility] office)

## 2014-12-17 NOTE — Progress Notes (Signed)
ANTIBIOTIC CONSULT NOTE - FOLLOW UP  Pharmacy Consult for vancomycin and primaxin Indication: pyocystitis  Allergies  Allergen Reactions  . Amoxicillin-Pot Clavulanate Nausea And Vomiting  . Hydromorphone Hcl     "body started down"  08/22/14 - pt states no longer has problems with this  . Rifampin Nausea And Vomiting    Patient Measurements: Height: 6\' 3"  (190.5 cm) Weight: 182 lb 15.7 oz (83 kg) IBW/kg (Calculated) : 84.5   Vital Signs: Temp: 98.2 F (36.8 C) (02/08 0900) Temp Source: Oral (02/08 0900) BP: 115/64 mmHg (02/08 0900) Pulse Rate: 78 (02/08 0900) Intake/Output from previous day: 02/07 0701 - 02/08 0700 In: 1720 [P.O.:1720] Out: 72 [Urine:70; Stool:2] Intake/Output from this shift: Total I/O In: 240 [P.O.:240] Out: -   Labs:  Recent Labs  12/15/14 1031 12/15/14 1038 12/16/14 1130  WBC 7.8  --   --   HGB 10.3*  --   --   PLT 82*  --   --   CREATININE  --  4.99* 4.13*   Estimated Creatinine Clearance: 26.5 mL/min (by C-G formula based on Cr of 4.13). No results for input(s): VANCOTROUGH, VANCOPEAK, VANCORANDOM, GENTTROUGH, GENTPEAK, GENTRANDOM, TOBRATROUGH, TOBRAPEAK, TOBRARND, AMIKACINPEAK, AMIKACINTROU, AMIKACIN in the last 72 hours.   Microbiology: Recent Results (from the past 720 hour(s))  Culture, blood (routine x 2)     Status: None   Collection Time: 12/10/14  7:27 PM  Result Value Ref Range Status   Specimen Description BLOOD RIGHT ARM  Final   Special Requests BOTTLES DRAWN AEROBIC AND ANAEROBIC 5CC  Final   Culture   Final    NO GROWTH 5 DAYS Performed at [facility]    Report Status 12/17/2014 FINAL  Final  Culture, blood (routine x 2)     Status: None   Collection Time: 12/10/14  7:54 PM  Result Value Ref Range Status   Specimen Description BLOOD RIGHT HAND  Final   Special Requests BOTTLES DRAWN AEROBIC ONLY 1CC  Final   Culture   Final    NO GROWTH 5 DAYS Note: Culture results may be compromised due to an  inadequate volume of blood received in culture bottles. Performed at [facility]    Report Status 12/17/2014 FINAL  Final  MRSA PCR Screening     Status: None   Collection Time: 12/11/14  4:35 AM  Result Value Ref Range Status   MRSA by PCR NEGATIVE NEGATIVE Final    Comment:        The GeneXpert MRSA Assay (FDA approved for NASAL specimens only), is one component of a comprehensive MRSA colonization surveillance program. It is not intended to diagnose MRSA infection nor to guide or monitor treatment for MRSA infections.   Anaerobic culture     Status: None (Preliminary result)   Collection Time: 12/14/14 11:45 AM  Result Value Ref Range Status   Specimen Description WOUND PENIS  Final   Special Requests NONE  Final   Gram Stain   Final    RARE WBC PRESENT,BOTH PMN AND MONONUCLEAR NO SQUAMOUS EPITHELIAL CELLS SEEN NO ORGANISMS SEEN Performed at [facility]    Culture   Final    NO ANAEROBES ISOLATED; CULTURE IN PROGRESS FOR 5 DAYS Performed at [facility]    Report Status PENDING  Incomplete  Wound culture     Status: None   Collection Time: 12/14/14 11:45 AM  Result Value Ref Range Status   Specimen Description WOUND PENIS  Final  Special Requests NONE  Final   Gram Stain   Final    RARE WBC PRESENT,BOTH PMN AND MONONUCLEAR NO SQUAMOUS EPITHELIAL CELLS SEEN NO ORGANISMS SEEN Performed at [facility]    Culture   Final    FEW CANDIDA ALBICANS Performed at [facility]    Report Status 12/16/2014 FINAL  Final  Clostridium Difficile by PCR     Status: None   Collection Time: 12/15/14 11:49 AM  Result Value Ref Range Status   C difficile by pcr NEGATIVE NEGATIVE Final    Anti-infectives    Start     Dose/Rate Route Frequency Ordered Stop   12/14/14 0900  gentamicin (GARAMYCIN) 160 mg in dextrose 5 % 50 mL IVPB     160 mg104 mL/hr over 60 Minutes Intravenous 30 min pre-op 12/13/14 1515 12/14/14 1130   12/14/14 0900   clindamycin (CLEOCIN) IVPB 900 mg     900 mg100 mL/hr over 30 Minutes Intravenous 30 min pre-op 12/13/14 1518 12/14/14 1125   12/13/14 1439  clindamycin (CLEOCIN) IVPB 900 mg  Status:  Discontinued     900 mg100 mL/hr over 30 Minutes Intravenous 30 min pre-op 12/13/14 1439 12/13/14 1518   12/13/14 1439  gentamicin (GARAMYCIN) 400 mg in dextrose 5 % 100 mL IVPB  Status:  Discontinued     5 mg/kg  79.9 kg110 mL/hr over 60 Minutes Intravenous 30 min pre-op 12/13/14 1439 12/13/14 1515   12/13/14 1200  vancomycin (VANCOCIN) IVPB 1000 mg/200 mL premix     1,000 mg200 mL/hr over 60 Minutes Intravenous Every T-Th-Sa (Hemodialysis) 12/12/14 1259     12/12/14 1000  vancomycin (VANCOCIN) 1,500 mg in sodium chloride 0.9 % 250 mL IVPB     1,500 mg250 mL/hr over 60 Minutes Intravenous  Once 12/12/14 0905 12/12/14 1208   12/10/14 1400  imipenem-cilastatin (PRIMAXIN) 250 mg in sodium chloride 0.9 % 100 mL IVPB     250 mg200 mL/hr over 30 Minutes Intravenous Every 6 hours 12/10/14 1312        Assessment: Patient is a 44 y.o M on vancomycin and primaxin for pyocystitis.  S/p I&D of L + R corpora carvernosa on 2/5 and placement of 2 penrose drains - bilateral drain now out.  Cath foley with yellow thickened discharged with urine culture obtained today. All cultures have been negative thus far. Last HD on 2/6 for 3.5 hours, BFR 450  lvq x 10 days pta per uro office note 2/1 Imi 2/1>> Vanc 2/3>> Clinda 900 pre-op on 2/5 Gent 2 mg/kg = 160 mg pre-op on 2/5  2/1 blood x 2 - neg FINAL 2/1 C diff - neg 2/5 wound - neg FINAL 2/5 anaerobic wound cx- ngtd 2/8 UCX- pending  Goal of Therapy:  Pre-HD vancomycin level= 15-25  Plan:  - continue vanc 1 gm after HD on TTS  -Change primaxin to  250 mg IV q12h  - F/u wound cx, clinical progress - Will hold off om checking vancomycin level for now unless plan is to continue vancomycin for patient at discharge.   Courteney Alderete P 12/17/2014,10:54  AM

## 2014-12-17 NOTE — Care Management Note (Signed)
CARE MANAGEMENT NOTE 12/17/2014  Patient:  Taylor [name], Taylor [name]   Account Number:  [id number]  Date Initiated:  12/17/2014  Documentation initiated by:  Ninoshka Wainwright  Subjective/Objective Assessment:   CM following for progression and d/c planning.     Action/Plan:   Met with pt will arrange any HH services as ordered.   Anticipated DC Date:  12/18/2014   Anticipated DC Plan:  [place]         Choice offered to / List presented to:             Status of service:  In process, will continue to follow Medicare Important Message given?  YES (If response is "NO", the following Medicare IM given date fields will be blank) Date Medicare IM given:  12/17/2014 Medicare IM given by:  Jakhari Space Date Additional Medicare IM given:   Additional Medicare IM given by:    Discharge Disposition:    Per UR Regulation:    If discussed at Long Length of Stay Meetings, dates discussed:    Comments:

## 2014-12-17 NOTE — Progress Notes (Signed)
3 Days Post-Op Subjective: Patient reports improvement in penile pain.  Primaxin and vancomycin All cultures, both aerobic and anerobic NEGATIVE to date.  Foley cath urine ( makes 30-50cc/day appears thickened and mucoid and yellow. Will re-culture and irrigate bladder. ( pyocystis).  Objective: Vital signs in last 24 hours: Temp:  [97.7 F (36.5 C)-98 F (36.7 C)] 97.7 F (36.5 C) (02/08 0524) Pulse Rate:  [70-81] 74 (02/08 0524) Resp:  [16-18] 17 (02/08 0524) BP: (110-160)/(61-84) 110/64 mmHg (02/08 0524) SpO2:  [99 %-100 %] 99 % (02/08 0524) Weight:  [83 kg (182 lb 15.7 oz)] 83 kg (182 lb 15.7 oz) (02/07 2018)  Intake/Output from previous day: 02/07 0701 - 02/08 0700 In: 1720 [P.O.:1720] Out: 72 [Urine:70; Stool:2] Intake/Output this shift:    Physical Exam:  General:alert and cooperative GI: not done and soft, non tender, normal bowel sounds, no palpable masses, no organomegaly, no inguinal hernia Male genitalia: not done no bladder distension noted Penis: circumcised, plaque and swelling Extremities: extremities normal, atraumatic, no cyanosis or edema  Lab Results:  Recent Labs  12/15/14 1031  HGB 10.3*  HCT 32.4*   BMET  Recent Labs  12/15/14 1038 12/16/14 1130  NA 129* 132*  K 5.8* 5.1  CL 96 93*  CO2 23 28  GLUCOSE 189* 259*  BUN 48* 31*  CREATININE 4.99* 4.13*  CALCIUM 7.5* 7.7*   No results for input(s): LABPT, INR in the last 72 hours. No results for input(s): LABURIN in the last 72 hours. Results for orders placed or performed during the hospital encounter of 12/10/14  Culture, blood (routine x 2)     Status: None (Preliminary result)   Collection Time: 12/10/14  7:27 PM  Result Value Ref Range Status   Specimen Description BLOOD RIGHT ARM  Final   Special Requests BOTTLES DRAWN AEROBIC AND ANAEROBIC 5CC  Final   Culture   Final           BLOOD CULTURE RECEIVED NO GROWTH TO DATE CULTURE WILL BE HELD FOR 5 DAYS BEFORE ISSUING A FINAL  NEGATIVE REPORT Performed at [facility]    Report Status PENDING  Incomplete  Culture, blood (routine x 2)     Status: None (Preliminary result)   Collection Time: 12/10/14  7:54 PM  Result Value Ref Range Status   Specimen Description BLOOD RIGHT HAND  Final   Special Requests BOTTLES DRAWN AEROBIC ONLY 1CC  Final   Culture   Final           BLOOD CULTURE RECEIVED NO GROWTH TO DATE CULTURE WILL BE HELD FOR 5 DAYS BEFORE ISSUING A FINAL NEGATIVE REPORT Note: Culture results may be compromised due to an inadequate volume of blood received in culture bottles. Performed at [facility]    Report Status PENDING  Incomplete  MRSA PCR Screening     Status: None   Collection Time: 12/11/14  4:35 AM  Result Value Ref Range Status   MRSA by PCR NEGATIVE NEGATIVE Final    Comment:        The GeneXpert MRSA Assay (FDA approved for NASAL specimens only), is one component of a comprehensive MRSA colonization surveillance program. It is not intended to diagnose MRSA infection nor to guide or monitor treatment for MRSA infections.   Anaerobic culture     Status: None (Preliminary result)   Collection Time: 12/14/14 11:45 AM  Result Value Ref Range Status   Specimen Description WOUND PENIS  Final   Special  Requests NONE  Final   Gram Stain   Final    RARE WBC PRESENT,BOTH PMN AND MONONUCLEAR NO SQUAMOUS EPITHELIAL CELLS SEEN NO ORGANISMS SEEN Performed at [facility]    Culture   Final    NO ANAEROBES ISOLATED; CULTURE IN PROGRESS FOR 5 DAYS Performed at [facility]    Report Status PENDING  Incomplete  Wound culture     Status: None   Collection Time: 12/14/14 11:45 AM  Result Value Ref Range Status   Specimen Description WOUND PENIS  Final   Special Requests NONE  Final   Gram Stain   Final    RARE WBC PRESENT,BOTH PMN AND MONONUCLEAR NO SQUAMOUS EPITHELIAL CELLS SEEN NO ORGANISMS SEEN Performed at [facility]    Culture   Final     FEW CANDIDA ALBICANS Performed at [facility]    Report Status 12/16/2014 FINAL  Final  Clostridium Difficile by PCR     Status: None   Collection Time: 12/15/14 11:49 AM  Result Value Ref Range Status   C difficile by pcr NEGATIVE NEGATIVE Final    Studies/Results: No results found.  Assessment/Plan  1. Post re-drainage of corpora cavernosa with finding of : small amound of pus under the L glans-drained, and irrigated. Bilateral drains now out. Penile edema improving. No erythema. Penile pain improved. Cultures negative. ( aerobic and anaerobic). Dressings changed daily.  2. Pyocystitis: Foley yields thickened bladder infected urine ( makes 30-50cc/day). Needs bladder irrigation. Will irrigate with  1/4 % acetic acid tiday 3. Anticipate able to discharge ? Wed. If irrigations go well. Will need to consider antibiotic for discharge.    LOS: 7 days   Siearra Amberg I 12/17/2014, 8:11 AM

## 2014-12-17 NOTE — Progress Notes (Signed)
Admit: 12/10/2014 LOS: 7  38M ESRD ([facility], [facility]) admit with infected penis (recent prosthesis), HONK,   Subjective:  No new events   02/07 0701 - 02/08 0700 In: 1720 [P.O.:1720] Out: 72 [Urine:70; Stool:2]  Filed Weights   12/15/14 1350 12/15/14 2012 12/16/14 2018  Weight: 77.7 kg (171 lb 4.8 oz) 82.3 kg (181 lb 7 oz) 83 kg (182 lb 15.7 oz)    Scheduled Meds: . acetic acid   Irrigation Q12H  . amitriptyline  25 mg Oral QHS  . aspirin  81 mg Oral q morning - 10a  . carvedilol  25 mg Oral BID WC  . cinacalcet  60 mg Oral Q supper  . citalopram  20 mg Oral Daily  . cloNIDine  0.1 mg Oral BID  . [START ON 12/18/2014] Darbepoetin Alfa  40 mcg Intravenous Q7 days  . doxercalciferol  1.5 mcg Intravenous Q T,Th,Sa-HD  . imipenem-cilastatin  250 mg Intravenous Q12H  . insulin aspart  0-9 Units Subcutaneous TID WC  . insulin aspart  7 Units Subcutaneous TID WC  . insulin glargine  30 Units Subcutaneous Daily  . isosorbide mononitrate  30 mg Oral BID  . levothyroxine  75 mcg Oral QAC breakfast  . multivitamin  1 tablet Oral QHS  . PARoxetine  20 mg Oral q morning - 10a  . pregabalin  150 mg Oral BID  . rosuvastatin  20 mg Oral QHS  . saccharomyces boulardii  250 mg Oral BID  . sevelamer carbonate  1,600 mg Oral TID WC  . vancomycin  1,000 mg Intravenous Q T,Th,Sa-HD   Continuous Infusions:  PRN Meds:.acetaminophen, HYDROcodone-acetaminophen, ondansetron **OR** ondansetron (ZOFRAN) IV  Current Labs: reviewed    Physical Exam:  Blood pressure 115/64, pulse 78, temperature 98.2 F (36.8 C), temperature source Oral, resp. rate 18, height 6\' 3"  (1.905 m), weight 83 kg (182 lb 15.7 oz), SpO2 99 %. NAD RRR CTAB LUA AVG +B/T No LEE R TMA noted  Nonfocal NO rashes/lesions  A/P 1. ESRD 1. [facility]S, [facility], 3.5h, Qb 450, EDW 77.5kg, AVG 2. Cont on schedule 2. HTN / Vol 1. BP controlled 2. Keep EDW at 77.5 3. Anemia:  1. On ESA, Hb > 10 4. 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   MD 12/17/2014, 11:34 AM   Recent Labs Lab 12/11/14 1032 12/12/14 0930 12/14/14 0530 12/15/14 1038 12/16/14 1130  NA 128* 132* 134* 129* 132*  K 3.2* 3.9 4.4 5.8* 5.1  CL 96 97 98 96 93*  CO2 24 24 26 23 28   GLUCOSE 290* 309* 176* 189* 259*  BUN 37* 31* 29* 48* 31*  CREATININE 3.62* 4.18* 3.86* 4.99* 4.13*  CALCIUM 8.6 7.9* 7.7* 7.5* 7.7*  PHOS 3.6 4.3  --   --   --     Recent Labs Lab 12/12/14 0930 12/14/14 0530 12/15/14 1031  WBC 5.2 4.6 7.8  NEUTROABS 4.0  --   --   HGB 10.1* 10.1* 10.3*  HCT 31.4* 32.2* 32.4*  MCV 88.7 89.0 88.5  PLT 95* 79* 82*

## 2014-12-17 NOTE — Progress Notes (Addendum)
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   Assessment/Plan: Hyperosmolar non ketotic hyperglycemia - sugars uncontrolled likely as a result of below mentioned infection - Last episode of hypoglycemia 2/70 4:42 AM - Hemoglobin A1c 15.4 Current orders for Inpatient glycemic control: Lantus 30 units daily, Novolog 0-9 units TID with meals, Novolog 5 units TID with meals for meal coverage Post prandial glucose is consistently elevated despite ordered meal coverage.  Increase meal coverage to Novolog 7 units TID with meals  Pyocystitis: Foley yields thickened bladder infected urine . According to urology patient needs bladder irrigation. 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  -  Prior to admission patient was on Levaquin 500mg  QOD x 10 days. He is s/p removal of IPP by Dr. on 08/23/14. Recently was also on SMZ-TMP DS 1 po BID X 10 days.  - s/p I and D  2/5 - cont Vanc and Primaxin Cultures negative. ( aerobic and anaerobic). Dressings changed daily.    Diarrhea Continue florastor , diarrhea resolved  DC contact precautions for C. difficile   End-stage renal disease on hemodialysis Nephrology following. dialyzed 3 times a week on Tuesday, Thursday, Saturday, in [place].   Hyponatremia- hyperkalemia - management with hemodialysis  Uinary tract infection? Urine culture from 2/2 negative although UA reveals pyuria.  Repeat urine culture 2/8   Hypertension Continue home regimen of clonidine, Coreg, Imdur.  Hypothyroidism Continue Synthroid.  Depression Continue Paxil, Celexa, Lyrica.   Code Status:  Full Family Communication: No family at bedside. Disposition Plan: f/u on cultures to determine antibiotics on d/c, disposition per urology, refer to their note   Consultants:  Neurology: Dr. 4/8 12/10/2014  Nephrology: Dr. 02/08/2015 12/11/2014  Procedures:  None  Antibiotics:  IV Primaxin 12/10/14  Vancomycin> 2/3  HPI/Subjective: Continues to have been out drainage but no pain  All cultures, both aerobic and anerobic NEGATIVE to date   Objective: Filed Vitals:   12/17/14 0900  BP: 115/64  Pulse: 78  Temp: 98.2 F (36.8 C)  Resp: 18    Intake/Output Summary (Last 24 hours) at 12/17/14 1124 Last data filed at 12/17/14 0919  Gross per 24 hour  Intake    960 ml  Output     72 ml  Net    888 ml   Filed Weights   12/15/14 1350 12/15/14 2012 12/16/14 2018  Weight: 77.7 kg (171 lb 4.8 oz) 82.3 kg (181 lb 7 oz) 83 kg (182 lb 15.7 oz)    Exam:  02/14/15 and alert, no acute distress CVS:RRR Resp:clear to auscultation b/l  Abd:+BS NT, ND Ext: No edema LUA AVG + bruit NEURO:CNI Ox3 no asterixis  Data Reviewed: Basic Metabolic Panel:  Recent Labs Lab 12/11/14 1032 12/12/14 0930 12/14/14 0530 12/15/14 1038 12/16/14 1130  NA 128* 132* 134* 129* 132*  K 3.2* 3.9 4.4 5.8* 5.1  CL 96 97 98 96 93*  CO2 24 24 26 23 28   GLUCOSE 290* 309* 176* 189* 259*  BUN 37* 31* 29* 48* 31*  CREATININE 3.62* 4.18* 3.86* 4.99* 4.13*  CALCIUM 8.6 7.9* 7.7* 7.5* 7.7*  PHOS 3.6 4.3  --   --   --  Liver Function Tests:  Recent Labs Lab 12/11/14 0227 12/12/14 0930 12/14/14 0530  AST 24  --  22  ALT 19  --  13  ALKPHOS 246*  --  233*  BILITOT 0.9  --  0.7  PROT 5.8*  --  6.1  ALBUMIN 2.4* 2.4* 2.4*   No results for input(s): LIPASE, AMYLASE in the last 168 hours. No results for input(s): AMMONIA in the last 168 hours. CBC:  Recent Labs Lab 12/10/14 1927 12/11/14 0227 12/12/14 0930 12/14/14 0530 12/15/14 1031  WBC 7.3 5.4 5.2 4.6 7.8  NEUTROABS   --   --  4.0  --   --   HGB 11.3* 10.1* 10.1* 10.1* 10.3*  HCT 34.5* 30.5* 31.4* 32.2* 32.4*  MCV 87.1 84.7 88.7 89.0 88.5  PLT 114* 98* 95* 79* 82*   Cardiac Enzymes: No results for input(s): CKTOTAL, CKMB, CKMBINDEX, TROPONINI in the last 168 hours. BNP (last 3 results) No results for input(s): BNP in the last 8760 hours.  ProBNP (last 3 results)  Recent Labs  03/26/14 1555  PROBNP 1212.0*    CBG:  Recent Labs Lab 12/16/14 0827 12/16/14 1154 12/16/14 1654 12/16/14 2018 12/17/14 0044  GLUCAP 144* 249* 209* 145* 169*    Recent Results (from the past 240 hour(s))  Culture, blood (routine x 2)     Status: None   Collection Time: 12/10/14  7:27 PM  Result Value Ref Range Status   Specimen Description BLOOD RIGHT ARM  Final   Special Requests BOTTLES DRAWN AEROBIC AND ANAEROBIC 5CC  Final   Culture   Final    NO GROWTH 5 DAYS Performed at [facility]    Report Status 12/17/2014 FINAL  Final  Culture, blood (routine x 2)     Status: None   Collection Time: 12/10/14  7:54 PM  Result Value Ref Range Status   Specimen Description BLOOD RIGHT HAND  Final   Special Requests BOTTLES DRAWN AEROBIC ONLY 1CC  Final   Culture   Final    NO GROWTH 5 DAYS Note: Culture results may be compromised due to an inadequate volume of blood received in culture bottles. Performed at [facility]    Report Status 12/17/2014 FINAL  Final  MRSA PCR Screening     Status: None   Collection Time: 12/11/14  4:35 AM  Result Value Ref Range Status   MRSA by PCR NEGATIVE NEGATIVE Final    Comment:        The GeneXpert MRSA Assay (FDA approved for NASAL specimens only), is one component of a comprehensive MRSA colonization surveillance program. It is not intended to diagnose MRSA infection nor to guide or monitor treatment for MRSA infections.   Anaerobic culture     Status: None (Preliminary result)   Collection Time: 12/14/14 11:45 AM  Result Value Ref Range Status    Specimen Description WOUND PENIS  Final   Special Requests NONE  Final   Gram Stain   Final    RARE WBC PRESENT,BOTH PMN AND MONONUCLEAR NO SQUAMOUS EPITHELIAL CELLS SEEN NO ORGANISMS SEEN Performed at [facility]    Culture   Final    NO ANAEROBES ISOLATED; CULTURE IN PROGRESS FOR 5 DAYS Performed at [facility]    Report Status PENDING  Incomplete  Wound culture     Status: None   Collection Time: 12/14/14 11:45 AM  Result Value Ref Range Status   Specimen Description WOUND PENIS  Final  Special Requests NONE  Final   Gram Stain   Final    RARE WBC PRESENT,BOTH PMN AND MONONUCLEAR NO SQUAMOUS EPITHELIAL CELLS SEEN NO ORGANISMS SEEN Performed at [facility]    Culture   Final    FEW CANDIDA ALBICANS Performed at [facility]    Report Status 12/16/2014 FINAL  Final  Clostridium Difficile by PCR     Status: None   Collection Time: 12/15/14 11:49 AM  Result Value Ref Range Status   C difficile by pcr NEGATIVE NEGATIVE Final     Studies: No results found.  Scheduled Meds: . acetic acid   Irrigation Q12H  . amitriptyline  25 mg Oral QHS  . aspirin  81 mg Oral q morning - 10a  . carvedilol  25 mg Oral BID WC  . cinacalcet  60 mg Oral Q supper  . citalopram  20 mg Oral Daily  . cloNIDine  0.1 mg Oral BID  . [START ON 12/18/2014] Darbepoetin Alfa  40 mcg Intravenous Q7 days  . doxercalciferol  1.5 mcg Intravenous Q T,Th,Sa-HD  . imipenem-cilastatin  250 mg Intravenous Q12H  . insulin aspart  0-9 Units Subcutaneous TID WC  . insulin aspart  5 Units Subcutaneous TID WC  . insulin glargine  30 Units Subcutaneous Daily  . isosorbide mononitrate  30 mg Oral BID  . levothyroxine  75 mcg Oral QAC breakfast  . multivitamin  1 tablet Oral QHS  . PARoxetine  20 mg Oral q morning - 10a  . pregabalin  150 mg Oral BID  . rosuvastatin  20 mg Oral QHS  . saccharomyces boulardii  250 mg Oral BID  . sevelamer carbonate  1,600 mg Oral TID WC  .  vancomycin  1,000 mg Intravenous Q T,Th,Sa-HD   Continuous Infusions:    Time spent: 30 MINS   Taylon Louison MD [facility] Pager -  [email or web address], password [facility] 12/17/2014, 11:24 AM  LOS: 7 days

## 2014-12-18 ENCOUNTER — Encounter (HOSPITAL_COMMUNITY): Payer: Self-pay | Admitting: Urology

## 2014-12-18 ENCOUNTER — Other Ambulatory Visit: Payer: Self-pay

## 2014-12-18 LAB — GLUCOSE, CAPILLARY
GLUCOSE-CAPILLARY: 218 mg/dL — AB (ref 70–99)
GLUCOSE-CAPILLARY: 153 mg/dL — AB (ref 70–99)
Glucose-Capillary: 155 mg/dL — ABNORMAL HIGH (ref 70–99)
Glucose-Capillary: 140 mg/dL — ABNORMAL HIGH (ref 70–99)
Glucose-Capillary: 168 mg/dL — ABNORMAL HIGH (ref 70–99)
Glucose-Capillary: 160 mg/dL — ABNORMAL HIGH (ref 70–99)
Glucose-Capillary: 137 mg/dL — ABNORMAL HIGH (ref 70–99)
Glucose-Capillary: 92 mg/dL (ref 70–99)

## 2014-12-18 LAB — CBC
HCT: 30.4 % — ABNORMAL LOW (ref 39.0–52.0)
Hemoglobin: 9.7 g/dL — ABNORMAL LOW (ref 13.0–17.0)
MCH: 28.7 pg (ref 26.0–34.0)
MCHC: 31.9 g/dL (ref 30.0–36.0)
MCV: 89.9 fL (ref 78.0–100.0)
PLATELETS: 74 10*3/uL — AB (ref 150–400)
RBC: 3.38 MIL/uL — AB (ref 4.22–5.81)
RDW: 15.3 % (ref 11.5–15.5)
WBC: 4.6 10*3/uL (ref 4.0–10.5)

## 2014-12-18 LAB — RENAL FUNCTION PANEL
Albumin: 2.3 g/dL — ABNORMAL LOW (ref 3.5–5.2)
Anion gap: 14 (ref 5–15)
BUN: 61 mg/dL — ABNORMAL HIGH (ref 6–23)
CALCIUM: 7.3 mg/dL — AB (ref 8.4–10.5)
CO2: 18 mmol/L — ABNORMAL LOW (ref 19–32)
CREATININE: 6.04 mg/dL — AB (ref 0.50–1.35)
Chloride: 97 mmol/L (ref 96–112)
GFR calc Af Amer: 12 mL/min — ABNORMAL LOW (ref >90)
GFR calc non Af Amer: 10 mL/min — ABNORMAL LOW (ref >90)
Glucose, Bld: 191 mg/dL — ABNORMAL HIGH (ref 70–99)
Phosphorus: 6.1 mg/dL — ABNORMAL HIGH (ref 2.3–4.6)
Potassium: 5.8 mmol/L — ABNORMAL HIGH (ref 3.5–5.1)
SODIUM: 129 mmol/L — AB (ref 135–145)

## 2014-12-18 LAB — URINE CULTURE: Colony Count: >=100000

## 2014-12-18 MED ORDER — FLUCONAZOLE IN SODIUM CHLORIDE 200-0.9 MG/100ML-% IV SOLN
200.0000 mg | Freq: Every day | INTRAVENOUS | Status: DC
  Administered 2014-12-18 – 2014-12-20 (×3): 200 mg via INTRAVENOUS
  Filled 2014-12-18 (×5): qty 100

## 2014-12-18 MED ORDER — DOXERCALCIFEROL 4 MCG/2ML IV SOLN
INTRAVENOUS | Status: AC
  Filled 2014-12-18: qty 2

## 2014-12-18 MED ORDER — DARBEPOETIN ALFA 40 MCG/0.4ML IJ SOSY
PREFILLED_SYRINGE | INTRAMUSCULAR | Status: AC
  Filled 2014-12-18: qty 0.4

## 2014-12-18 MED ORDER — LOPERAMIDE HCL 2 MG PO CAPS
2.0000 mg | ORAL_CAPSULE | Freq: Once | ORAL | Status: AC
  Administered 2014-12-18: 2 mg via ORAL
  Filled 2014-12-18: qty 1

## 2014-12-18 NOTE — Telephone Encounter (Signed)
Taylor [name] with [facility] left v/m requesting refill carvedilol. Please advise. Last seen 10/10/14 hospital f/u; pt has medicare wellness on 01/09/2015.

## 2014-12-18 NOTE — Progress Notes (Signed)
ANTIBIOTIC CONSULT NOTE - Initial Consult  Pharmacy Consult for fluconazole Indication: pyocystitis  Allergies  Allergen Reactions  . Amoxicillin-Pot Clavulanate Nausea And Vomiting  . Hydromorphone Hcl     "body started down"  08/22/14 - pt states no longer has problems with this  . Rifampin Nausea And Vomiting    Patient Measurements: Height: 6\' 3"  (190.5 cm) Weight: 172 lb 13.5 oz (78.4 kg) IBW/kg (Calculated) : 84.5   Vital Signs: Temp: 97.6 F (36.4 C) (02/09 1315) Temp Source: Oral (02/09 1315) BP: 137/63 mmHg (02/09 1315) Pulse Rate: 76 (02/09 1315) Intake/Output from previous day: 02/08 0701 - 02/09 0700 In: 1120 [P.O.:1020; IV Piggyback:100] Out: 252 [Urine:250; Stool:2] Intake/Output from this shift: Total I/O In: 340 [P.O.:240; IV Piggyback:100] Out: 5900 [Other:5900]  Labs:  Recent Labs  12/16/14 1130 12/18/14 0811  WBC  --  4.6  HGB  --  9.7*  PLT  --  74*  CREATININE 4.13* 6.04*   Estimated Creatinine Clearance: 17.1 mL/min (by C-G formula based on Cr of 6.04). No results for input(s): VANCOTROUGH, VANCOPEAK, VANCORANDOM, GENTTROUGH, GENTPEAK, GENTRANDOM, TOBRATROUGH, TOBRAPEAK, TOBRARND, AMIKACINPEAK, AMIKACINTROU, AMIKACIN in the last 72 hours.   Microbiology: Recent Results (from the past 720 hour(s))  Culture, blood (routine x 2)     Status: None   Collection Time: 12/10/14  7:27 PM  Result Value Ref Range Status   Specimen Description BLOOD RIGHT ARM  Final   Special Requests BOTTLES DRAWN AEROBIC AND ANAEROBIC 5CC  Final   Culture   Final    NO GROWTH 5 DAYS Performed at [facility]    Report Status 12/17/2014 FINAL  Final  Culture, blood (routine x 2)     Status: None   Collection Time: 12/10/14  7:54 PM  Result Value Ref Range Status   Specimen Description BLOOD RIGHT HAND  Final   Special Requests BOTTLES DRAWN AEROBIC ONLY 1CC  Final   Culture   Final    NO GROWTH 5 DAYS Note: Culture results may be compromised due  to an inadequate volume of blood received in culture bottles. Performed at [facility]    Report Status 12/17/2014 FINAL  Final  MRSA PCR Screening     Status: None   Collection Time: 12/11/14  4:35 AM  Result Value Ref Range Status   MRSA by PCR NEGATIVE NEGATIVE Final    Comment:        The GeneXpert MRSA Assay (FDA approved for NASAL specimens only), is one component of a comprehensive MRSA colonization surveillance program. It is not intended to diagnose MRSA infection nor to guide or monitor treatment for MRSA infections.   Anaerobic culture     Status: None (Preliminary result)   Collection Time: 12/14/14 11:45 AM  Result Value Ref Range Status   Specimen Description WOUND PENIS  Final   Special Requests NONE  Final   Gram Stain   Final    RARE WBC PRESENT,BOTH PMN AND MONONUCLEAR NO SQUAMOUS EPITHELIAL CELLS SEEN NO ORGANISMS SEEN Performed at [facility]    Culture   Final    NO ANAEROBES ISOLATED; CULTURE IN PROGRESS FOR 5 DAYS Performed at [facility]    Report Status PENDING  Incomplete  Wound culture     Status: None   Collection Time: 12/14/14 11:45 AM  Result Value Ref Range Status   Specimen Description WOUND PENIS  Final   Special Requests NONE  Final   Gram Stain   Final  RARE WBC PRESENT,BOTH PMN AND MONONUCLEAR NO SQUAMOUS EPITHELIAL CELLS SEEN NO ORGANISMS SEEN Performed at [facility]    Culture   Final    FEW CANDIDA ALBICANS Performed at [facility]    Report Status 12/16/2014 FINAL  Final  Clostridium Difficile by PCR     Status: None   Collection Time: 12/15/14 11:49 AM  Result Value Ref Range Status   C difficile by pcr NEGATIVE NEGATIVE Final  Urine culture     Status: None   Collection Time: 12/17/14 10:50 AM  Result Value Ref Range Status   Specimen Description URINE, RANDOM  Final   Special Requests NONE  Final   Colony Count   Final    >=100,000 COLONIES/ML Performed at [facility]    Culture YEAST Performed at [facility]   Final   Report Status 12/18/2014 FINAL  Final    Anti-infectives    Start     Dose/Rate Route Frequency Ordered Stop   12/17/14 2000  imipenem-cilastatin (PRIMAXIN) 250 mg in sodium chloride 0.9 % 100 mL IVPB     250 mg200 mL/hr over 30 Minutes Intravenous Every 12 hours 12/17/14 1117     12/14/14 0900  gentamicin (GARAMYCIN) 160 mg in dextrose 5 % 50 mL IVPB     160 mg104 mL/hr over 60 Minutes Intravenous 30 min pre-op 12/13/14 1515 12/14/14 1130   12/14/14 0900  clindamycin (CLEOCIN) IVPB 900 mg     900 mg100 mL/hr over 30 Minutes Intravenous 30 min pre-op 12/13/14 1518 12/14/14 1125   12/13/14 1439  clindamycin (CLEOCIN) IVPB 900 mg  Status:  Discontinued     900 mg100 mL/hr over 30 Minutes Intravenous 30 min pre-op 12/13/14 1439 12/13/14 1518   12/13/14 1439  gentamicin (GARAMYCIN) 400 mg in dextrose 5 % 100 mL IVPB  Status:  Discontinued     5 mg/kg  79.9 kg110 mL/hr over 60 Minutes Intravenous 30 min pre-op 12/13/14 1439 12/13/14 1515   12/13/14 1200  vancomycin (VANCOCIN) IVPB 1000 mg/200 mL premix     1,000 mg200 mL/hr over 60 Minutes Intravenous Every T-Th-Sa (Hemodialysis) 12/12/14 1259     12/12/14 1000  vancomycin (VANCOCIN) 1,500 mg in sodium chloride 0.9 % 250 mL IVPB     1,500 mg250 mL/hr over 60 Minutes Intravenous  Once 12/12/14 0905 12/12/14 1208   12/10/14 1400  imipenem-cilastatin (PRIMAXIN) 250 mg in sodium chloride 0.9 % 100 mL IVPB  Status:  Discontinued     250 mg200 mL/hr over 30 Minutes Intravenous Every 6 hours 12/10/14 1312 12/17/14 1117      Assessment: Patient is a 44 y.o M on vancomycin and primaxin for pyocystitis.  S/p I&D of L + R corpora carvernosa on 2/5 and placement of 2 penrose drains - bilateral drain now out.  Cath foley with yellow thickened discharged with urine culture obtained today. All cultures have been negative thus far. Last HD on 2/6 for 3.5 hours, BFR 450.  Pharmacy  is consulted to dose fluconazole for pyocystitis with urine culture growing >100k colonies of yeast.   lvq x 10 days pta per uro office note 2/1 Imi 2/1>> Vanc 2/3>> Clinda 900 pre-op on 2/5 Gent 2 mg/kg = 160 mg pre-op on 2/5  2/1 blood x 2 - neg FINAL 2/1 C diff - neg 2/5 wound - neg FINAL 2/5 anaerobic wound cx- ngtd 2/8 UCX- pending  Goal of Therapy:  Pre-HD vancomycin level= 15-25  Plan:  -  Fluconazole 200mg  IV daily after HD on HD days - Continue vanc 1 gm after HD on TTS  - Continueprimaxin to  250 mg IV q12h  - F/u wound cx, clinical progress, and LOT   . Arlean Hopping, PharmD Clinical Pharmacist Pager [phone] 12/18/2014,3:03 PM

## 2014-12-18 NOTE — Procedures (Signed)
I was present at this dialysis session. I have reviewed the session itself and made appropriate changes.   Wieght up, extend treatment to try to achieve EDW.  Pt tolerating well.  No c/o.  Has mild hyponatremia, discussed being cautions with fluids / water / ice chips.    Sabra Heck  MD 12/18/2014, 9:05 AM

## 2014-12-18 NOTE — Progress Notes (Signed)
4 Days Post-Op Subjective: 1. Pyocyctis : probable funguria. C/s pending. On q shift 1/4 % acetic acid bladder irrigations. Catheter still looks mucoid. 2. Penile infection:  Glans looks normal. Slight pink. Dressing changed tonight. Edema down and glans and shaft normalizing for [name].   Objective: Vital signs in last 24 hours: Temp:  [97.6 F (36.4 C)-98 F (36.7 C)] 97.6 F (36.4 C) (02/09 1315) Pulse Rate:  [66-77] 76 (02/09 1315) Resp:  [18] 18 (02/09 1315) BP: (102-142)/(38-77) 137/63 mmHg (02/09 1315) SpO2:  [98 %-100 %] 99 % (02/09 1315) Weight:  [78.4 kg (172 lb 13.5 oz)-83.5 kg (184 lb 1.4 oz)] 78.4 kg (172 lb 13.5 oz) (02/09 1215)  Intake/Output from previous day: 02/08 0701 - 02/09 0700 In: 1120 [P.O.:1020; IV Piggyback:100] Out: 252 [Urine:250; Stool:2] Intake/Output this shift: Total I/O In: 340 [P.O.:240; IV Piggyback:100] Out: 5900 [Other:5900]  Physical Exam:  General:alert and cooperative GI: not indicated Male genitalia: not done no bladder distension noted Penis: circumcised, balanitis, plaque and swelling Urethral Meatus: normal Catheter still has mucous within.   Lab Results:  Recent Labs  12/18/14 0811  HGB 9.7*  HCT 30.4*   BMET  Recent Labs  12/16/14 1130 12/18/14 0811  NA 132* 129*  K 5.1 5.8*  CL 93* 97  CO2 28 18*  GLUCOSE 259* 191*  BUN 31* 61*  CREATININE 4.13* 6.04*  CALCIUM 7.7* 7.3*   No results for input(s): LABPT, INR in the last 72 hours. No results for input(s): LABURIN in the last 72 hours. Results for orders placed or performed during the hospital encounter of 12/10/14  Culture, blood (routine x 2)     Status: None   Collection Time: 12/10/14  7:27 PM  Result Value Ref Range Status   Specimen Description BLOOD RIGHT ARM  Final   Special Requests BOTTLES DRAWN AEROBIC AND ANAEROBIC 5CC  Final   Culture   Final    NO GROWTH 5 DAYS Performed at [facility]    Report Status 12/17/2014 FINAL  Final   Culture, blood (routine x 2)     Status: None   Collection Time: 12/10/14  7:54 PM  Result Value Ref Range Status   Specimen Description BLOOD RIGHT HAND  Final   Special Requests BOTTLES DRAWN AEROBIC ONLY 1CC  Final   Culture   Final    NO GROWTH 5 DAYS Note: Culture results may be compromised due to an inadequate volume of blood received in culture bottles. Performed at [facility]    Report Status 12/17/2014 FINAL  Final  MRSA PCR Screening     Status: None   Collection Time: 12/11/14  4:35 AM  Result Value Ref Range Status   MRSA by PCR NEGATIVE NEGATIVE Final    Comment:        The GeneXpert MRSA Assay (FDA approved for NASAL specimens only), is one component of a comprehensive MRSA colonization surveillance program. It is not intended to diagnose MRSA infection nor to guide or monitor treatment for MRSA infections.   Anaerobic culture     Status: None (Preliminary result)   Collection Time: 12/14/14 11:45 AM  Result Value Ref Range Status   Specimen Description WOUND PENIS  Final   Special Requests NONE  Final   Gram Stain   Final    RARE WBC PRESENT,BOTH PMN AND MONONUCLEAR NO SQUAMOUS EPITHELIAL CELLS SEEN NO ORGANISMS SEEN Performed at [facility]   Final  NO ANAEROBES ISOLATED; CULTURE IN PROGRESS FOR 5 DAYS Performed at [facility]    Report Status PENDING  Incomplete  Wound culture     Status: None   Collection Time: 12/14/14 11:45 AM  Result Value Ref Range Status   Specimen Description WOUND PENIS  Final   Special Requests NONE  Final   Gram Stain   Final    RARE WBC PRESENT,BOTH PMN AND MONONUCLEAR NO SQUAMOUS EPITHELIAL CELLS SEEN NO ORGANISMS SEEN Performed at [facility]    Culture   Final    FEW CANDIDA ALBICANS Performed at [facility]    Report Status 12/16/2014 FINAL  Final  Clostridium Difficile by PCR     Status: None   Collection Time: 12/15/14 11:49 AM  Result Value Ref  Range Status   C difficile by pcr NEGATIVE NEGATIVE Final  Urine culture     Status: None   Collection Time: 12/17/14 10:50 AM  Result Value Ref Range Status   Specimen Description URINE, RANDOM  Final   Special Requests NONE  Final   Colony Count   Final    >=100,000 COLONIES/ML Performed at [facility]    Culture YEAST Performed at [facility]   Final   Report Status 12/18/2014 FINAL  Final    Studies/Results: No results found.  Assessment/Plan: 1. Pyocystis: will need more aggressive irrigations to clear out bladder. Will order bladder irrigations with 300cc of bladder irrigand with 1/4 % acetic acid q shift via toomey syringe,  2. Penile dressings look very good: dressing changed tonight. Anticipate d/c dressings soon.   LOS: 8 days   Taylor [name] I 12/18/2014, 6:09 PM

## 2014-12-18 NOTE — Progress Notes (Addendum)
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   Assessment/Plan: Hyperosmolar non ketotic hyperglycemia upon admission-resolved  - sugars uncontrolled likely as a result of below mentioned infection - Last episode of hypoglycemia 2/70 4:42 AM, CBG acceptable since  - Hemoglobin A1c 15.4 Continue Lantus 30 units daily, Novolog 0-9 units TID with meals, Novolog 7 units TID with meals for meal coverage    Pyocystitis:  urology recommended  bladder irrigation on 2/8  with 1/4 % acetic acid , awaiting for further urology recommendations Urine growing greater than 100,000 colonies, culture showing yeast Sensitivity pending , currently on vancomycin and imipenem Will add fluconazole   Penile infection status Post re-drainage of corpora cavernosa with finding of : small amound of pus under the L glans-drained, and irrigated - Blood cultures no growth so far since admission.  - s/p I and D  2/5 - cont Vanc and Primaxin, added fluconazole  Incision and drainage culture showing few Candida albicans ( aerobic and anaerobic). Dressings changed daily.    Diarrhea-resolved Continue florastor ,     End-stage renal disease on hemodialysis Nephrology following. dialyzed 3 times a week on Tuesday, Thursday, Saturday, in [place].   Hyponatremia- hyperkalemia - management with hemodialysis  Uinary tract infection? Urine culture from 2/2 negative although UA reveals pyuria.  Repeat urine culture 2/8 showing yeast awaiting sensitivity   Hypertension Continue home regimen of clonidine, Coreg, Imdur.  Hypothyroidism Continue Synthroid.  Depression Continue Paxil, Celexa, Lyrica.   Code Status: Full Family Communication: No family at  bedside. Disposition Plan: f/u on cultures to determine antibiotics on d/c, disposition per urology, refer to their note   Consultants:  Neurology: Dr. Patsi Sears 12/10/2014  Nephrology: Dr. Briant Cedar 12/11/2014  Procedures:  None  Antibiotics:  IV Primaxin 12/10/14  Vancomycin> 2/3  HPI/Subjective: Denies pain, resting comfortably during hemodialysis, no chest pain or shortness of breath   Objective: Filed Vitals:   12/18/14 1215  BP: 142/77  Pulse: 77  Temp: 97.8 F (36.6 C)  Resp: 18    Intake/Output Summary (Last 24 hours) at 12/18/14 1437 Last data filed at 12/18/14 1300  Gross per 24 hour  Intake   1000 ml  Output   6102 ml  Net  -5102 ml   Filed Weights   12/17/14 2033 12/18/14 0800 12/18/14 1215  Weight: 82.9 kg (182 lb 12.2 oz) 83.5 kg (184 lb 1.4 oz) 78.4 kg (172 lb 13.5 oz)    Exam:  General:alert and cooperative GI: not done and soft, non tender, normal bowel sounds, no palpable masses, no organomegaly, no inguinal hernia Male genitalia: not done no bladder distension noted Penis: circumcised, plaque and swelling Extremities: extremities normal, atraumatic, no cyanosis or edema  Data Reviewed: Basic Metabolic Panel:  Recent Labs Lab 12/12/14 0930 12/14/14 0530 12/15/14 1038 12/16/14 1130 12/18/14 0811  NA 132* 134* 129* 132* 129*  K 3.9 4.4 5.8* 5.1 5.8*  CL 97 98 96 93* 97  CO2 24 26 23 28  18*  GLUCOSE 309* 176* 189* 259* 191*  BUN 31* 29* 48* 31* 61*  CREATININE 4.18* 3.86* 4.99* 4.13* 6.04*  CALCIUM 7.9* 7.7* 7.5* 7.7* 7.3*  PHOS 4.3  --   --   --  6.1*   Liver Function Tests:  Recent Labs Lab  12/12/14 0930 12/14/14 0530 12/18/14 0811  AST  --  22  --   ALT  --  13  --   ALKPHOS  --  233*  --   BILITOT  --  0.7  --   PROT  --  6.1  --   ALBUMIN 2.4* 2.4* 2.3*   No results for input(s): LIPASE, AMYLASE in the last 168 hours. No results for input(s): AMMONIA in the last 168 hours. CBC:  Recent Labs Lab  12/12/14 0930 12/14/14 0530 12/15/14 1031 12/18/14 0811  WBC 5.2 4.6 7.8 4.6  NEUTROABS 4.0  --   --   --   HGB 10.1* 10.1* 10.3* 9.7*  HCT 31.4* 32.2* 32.4* 30.4*  MCV 88.7 89.0 88.5 89.9  PLT 95* 79* 82* 74*   Cardiac Enzymes: No results for input(s): CKTOTAL, CKMB, CKMBINDEX, TROPONINI in the last 168 hours. BNP (last 3 results) No results for input(s): BNP in the last 8760 hours.  ProBNP (last 3 results)  Recent Labs  03/26/14 1555  PROBNP 1212.0*    CBG:  Recent Labs Lab 12/16/14 1154 12/16/14 1654 12/16/14 2018 12/17/14 0044 12/18/14 1305  GLUCAP 249* 209* 145* 169* 153*    Recent Results (from the past 240 hour(s))  Culture, blood (routine x 2)     Status: None   Collection Time: 12/10/14  7:27 PM  Result Value Ref Range Status   Specimen Description BLOOD RIGHT ARM  Final   Special Requests BOTTLES DRAWN AEROBIC AND ANAEROBIC 5CC  Final   Culture   Final    NO GROWTH 5 DAYS Performed at [facility]    Report Status 12/17/2014 FINAL  Final  Culture, blood (routine x 2)     Status: None   Collection Time: 12/10/14  7:54 PM  Result Value Ref Range Status   Specimen Description BLOOD RIGHT HAND  Final   Special Requests BOTTLES DRAWN AEROBIC ONLY 1CC  Final   Culture   Final    NO GROWTH 5 DAYS Note: Culture results may be compromised due to an inadequate volume of blood received in culture bottles. Performed at [facility]    Report Status 12/17/2014 FINAL  Final  MRSA PCR Screening     Status: None   Collection Time: 12/11/14  4:35 AM  Result Value Ref Range Status   MRSA by PCR NEGATIVE NEGATIVE Final    Comment:        The GeneXpert MRSA Assay (FDA approved for NASAL specimens only), is one component of a comprehensive MRSA colonization surveillance program. It is not intended to diagnose MRSA infection nor to guide or monitor treatment for MRSA infections.   Anaerobic culture     Status: None (Preliminary result)    Collection Time: 12/14/14 11:45 AM  Result Value Ref Range Status   Specimen Description WOUND PENIS  Final   Special Requests NONE  Final   Gram Stain   Final    RARE WBC PRESENT,BOTH PMN AND MONONUCLEAR NO SQUAMOUS EPITHELIAL CELLS SEEN NO ORGANISMS SEEN Performed at [facility]    Culture   Final    NO ANAEROBES ISOLATED; CULTURE IN PROGRESS FOR 5 DAYS Performed at [facility]    Report Status PENDING  Incomplete  Wound culture     Status: None   Collection Time: 12/14/14 11:45 AM  Result Value Ref Range Status   Specimen Description WOUND PENIS  Final   Special Requests NONE  Final  Gram Stain   Final    RARE WBC PRESENT,BOTH PMN AND MONONUCLEAR NO SQUAMOUS EPITHELIAL CELLS SEEN NO ORGANISMS SEEN Performed at [facility]    Culture   Final    FEW CANDIDA ALBICANS Performed at [facility]    Report Status 12/16/2014 FINAL  Final  Clostridium Difficile by PCR     Status: None   Collection Time: 12/15/14 11:49 AM  Result Value Ref Range Status   C difficile by pcr NEGATIVE NEGATIVE Final  Urine culture     Status: None   Collection Time: 12/17/14 10:50 AM  Result Value Ref Range Status   Specimen Description URINE, RANDOM  Final   Special Requests NONE  Final   Colony Count   Final    >=100,000 COLONIES/ML Performed at [facility]    Culture YEAST Performed at [facility]   Final   Report Status 12/18/2014 FINAL  Final     Studies: No results found.  Scheduled Meds: . acetic acid   Irrigation Q12H  . amitriptyline  25 mg Oral QHS  . aspirin  81 mg Oral q morning - 10a  . carvedilol  25 mg Oral BID WC  . cinacalcet  60 mg Oral Q supper  . citalopram  20 mg Oral Daily  . cloNIDine  0.1 mg Oral BID  . Darbepoetin Alfa      . Darbepoetin Alfa  40 mcg Intravenous Q7 days  . doxercalciferol      . doxercalciferol  1.5 mcg Intravenous Q T,Th,Sa-HD  . imipenem-cilastatin  250 mg Intravenous Q12H  .  insulin aspart  0-9 Units Subcutaneous TID WC  . insulin aspart  7 Units Subcutaneous TID WC  . insulin glargine  30 Units Subcutaneous Daily  . isosorbide mononitrate  30 mg Oral BID  . levothyroxine  75 mcg Oral QAC breakfast  . multivitamin  1 tablet Oral QHS  . PARoxetine  20 mg Oral q morning - 10a  . pregabalin  150 mg Oral BID  . rosuvastatin  20 mg Oral QHS  . saccharomyces boulardii  250 mg Oral BID  . sevelamer carbonate  1,600 mg Oral TID WC  . vancomycin  1,000 mg Intravenous Q T,Th,Sa-HD   Continuous Infusions:    Time spent: 30 MINS   Alaiya Martindelcampo MD [facility] Pager -  [email or web address], password [facility] 12/18/2014, 2:37 PM  LOS: 8 days

## 2014-12-18 NOTE — Telephone Encounter (Signed)
Cardiology should be managing and refilling carvedilol.  Please forward to cardiology.

## 2014-12-19 ENCOUNTER — Inpatient Hospital Stay (HOSPITAL_COMMUNITY): Payer: Medicare Other

## 2014-12-19 LAB — CLOSTRIDIUM DIFFICILE BY PCR: Toxigenic C. Difficile by PCR: NEGATIVE

## 2014-12-19 LAB — ANAEROBIC CULTURE

## 2014-12-19 LAB — GLUCOSE, CAPILLARY
GLUCOSE-CAPILLARY: 80 mg/dL (ref 70–99)
Glucose-Capillary: 144 mg/dL — ABNORMAL HIGH (ref 70–99)
Glucose-Capillary: 106 mg/dL — ABNORMAL HIGH (ref 70–99)
Glucose-Capillary: 147 mg/dL — ABNORMAL HIGH (ref 70–99)
Glucose-Capillary: 74 mg/dL (ref 70–99)
Glucose-Capillary: 99 mg/dL (ref 70–99)

## 2014-12-19 MED ORDER — LOPERAMIDE HCL 2 MG PO CAPS
2.0000 mg | ORAL_CAPSULE | Freq: Once | ORAL | Status: AC
  Administered 2014-12-19: 2 mg via ORAL
  Filled 2014-12-19: qty 1

## 2014-12-19 MED ORDER — VITAMINS A & D EX OINT
TOPICAL_OINTMENT | CUTANEOUS | Status: DC | PRN
  Administered 2014-12-19: 1 via TOPICAL
  Filled 2014-12-19 (×2): qty 5

## 2014-12-19 MED ORDER — ONDANSETRON HCL 4 MG/2ML IJ SOLN: 4.0000 mg | Freq: Four times a day (QID) | INTRAMUSCULAR | Status: DC | PRN

## 2014-12-19 NOTE — Progress Notes (Signed)
Admit: 12/10/2014 LOS: 9  74M ESRD ([facility], [facility]) admit with infected penis (recent prosthesis), HONK, candidal pyocystitis  Subjective:  Yeast from Urine culture, on fluconazole Urology following HD yesterday, post weight 78.4kg, 5.9L UF neg  02/09 0701 - 02/10 0700 In: 1460 [P.O.:960; IV Piggyback:500] Out: 6200 [Urine:300]  Filed Weights   12/18/14 0800 12/18/14 1215 12/18/14 2030  Weight: 83.5 kg (184 lb 1.4 oz) 78.4 kg (172 lb 13.5 oz) 79.2 kg (174 lb 9.7 oz)    Scheduled Meds: . acetic acid   Irrigation Q12H  . amitriptyline  25 mg Oral QHS  . aspirin  81 mg Oral q morning - 10a  . carvedilol  25 mg Oral BID WC  . cinacalcet  60 mg Oral Q supper  . citalopram  20 mg Oral Daily  . cloNIDine  0.1 mg Oral BID  . Darbepoetin Alfa  40 mcg Intravenous Q7 days  . doxercalciferol  1.5 mcg Intravenous Q T,Th,Sa-HD  . fluconazole (DIFLUCAN) IV  200 mg Intravenous Daily  . imipenem-cilastatin  250 mg Intravenous Q12H  . insulin aspart  0-9 Units Subcutaneous TID WC  . insulin aspart  7 Units Subcutaneous TID WC  . insulin glargine  30 Units Subcutaneous Daily  . isosorbide mononitrate  30 mg Oral BID  . levothyroxine  75 mcg Oral QAC breakfast  . multivitamin  1 tablet Oral QHS  . PARoxetine  20 mg Oral q morning - 10a  . pregabalin  150 mg Oral BID  . rosuvastatin  20 mg Oral QHS  . saccharomyces boulardii  250 mg Oral BID  . sevelamer carbonate  1,600 mg Oral TID WC   Continuous Infusions:  PRN Meds:.acetaminophen, calcium carbonate, HYDROcodone-acetaminophen, vitamin A & D  Current Labs: reviewed    Physical Exam:  Blood pressure 107/60, pulse 77, temperature 98.1 F (36.7 C), temperature source Oral, resp. rate 17, height 6\' 3"  (1.905 m), weight 79.2 kg (174 lb 9.7 oz), SpO2 100 %. NAD RRR CTAB LUA AVG +B/T No LEE R TMA noted  Nonfocal NO rashes/lesions  A/P 1. ESRD 1. [facility], [facility], 3.5h, Qb 450, EDW 77.5kg, AVG 2. Cont on  schedule 3. No heparin 2. HTN / Vol 1. BP controlled 2. Keep EDW at 77.5 3. Anemia:  1. On ESA, Hb > 10 2. Cont ESA 3. 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 Fungal pyocystitis 6. Penile infection  MD 12/19/2014, 11:29 AM   Recent Labs Lab 12/15/14 1038 12/16/14 1130 12/18/14 0811  NA 129* 132* 129*  K 5.8* 5.1 5.8*  CL 96 93* 97  CO2 23 28 18*  GLUCOSE 189* 259* 191*  BUN 48* 31* 61*  CREATININE 4.99* 4.13* 6.04*  CALCIUM 7.5* 7.7* 7.3*  PHOS  --   --  6.1*    Recent Labs Lab 12/14/14 0530 12/15/14 1031 12/18/14 0811  WBC 4.6 7.8 4.6  HGB 10.1* 10.3* 9.7*  HCT 32.2* 32.4* 30.4*  MCV 89.0 88.5 89.9  PLT 79* 82* 74*

## 2014-12-19 NOTE — Progress Notes (Signed)
5 Days Post-Op Subjective: Patient reports no more penile pain, but diarrhea: " always happens when I get IV's". C-Diff. Neg to date.   Objective: Vital signs in last 24 hours: Temp:  [97.6 F (36.4 C)-98 F (36.7 C)] 98 F (36.7 C) (02/10 0415) Pulse Rate:  [66-80] 74 (02/10 0415) Resp:  [17-18] 17 (02/10 0415) BP: (105-154)/(38-99) 154/99 mmHg (02/10 0415) SpO2:  [98 %-100 %] 100 % (02/10 0415) Weight:  [78.4 kg (172 lb 13.5 oz)-83.5 kg (184 lb 1.4 oz)] 79.2 kg (174 lb 9.7 oz) (02/09 2030)  Intake/Output from previous day: 02/09 0701 - 02/10 0700 In: 1460 [P.O.:960; IV Piggyback:500] Out: 6100 [Urine:200] Intake/Output this shift:    Physical Exam:  General:alert and cooperative GI: not done and soft, non tender, normal bowel sounds, no palpable masses, no organomegaly, no inguinal hernia Male Genitalia: Penis: circumcised. Wounds healed. No erythema. No pus. No drainage. No tenderness. Penile edema resolved. Penile pain resolved.  Bladder: Pyocystis continues. No pain. Foley remains.    Lab Results:  Recent Labs  12/18/14 0811  HGB 9.7*  HCT 30.4*   BMET  Recent Labs  12/16/14 1130 12/18/14 0811  NA 132* 129*  K 5.1 5.8*  CL 93* 97  CO2 28 18*  GLUCOSE 259* 191*  BUN 31* 61*  CREATININE 4.13* 6.04*  CALCIUM 7.7* 7.3*   No results for input(s): LABPT, INR in the last 72 hours. No results for input(s): LABURIN in the last 72 hours. Results for orders placed or performed during the hospital encounter of 12/10/14  Culture, blood (routine x 2)     Status: None   Collection Time: 12/10/14  7:27 PM  Result Value Ref Range Status   Specimen Description BLOOD RIGHT ARM  Final   Special Requests BOTTLES DRAWN AEROBIC AND ANAEROBIC 5CC  Final   Culture   Final    NO GROWTH 5 DAYS Performed at [facility]    Report Status 12/17/2014 FINAL  Final  Culture, blood (routine x 2)     Status: None   Collection Time: 12/10/14  7:54 PM  Result Value Ref  Range Status   Specimen Description BLOOD RIGHT HAND  Final   Special Requests BOTTLES DRAWN AEROBIC ONLY 1CC  Final   Culture   Final    NO GROWTH 5 DAYS Note: Culture results may be compromised due to an inadequate volume of blood received in culture bottles. Performed at [facility]    Report Status 12/17/2014 FINAL  Final  MRSA PCR Screening     Status: None   Collection Time: 12/11/14  4:35 AM  Result Value Ref Range Status   MRSA by PCR NEGATIVE NEGATIVE Final    Comment:        The GeneXpert MRSA Assay (FDA approved for NASAL specimens only), is one component of a comprehensive MRSA colonization surveillance program. It is not intended to diagnose MRSA infection nor to guide or monitor treatment for MRSA infections.   Anaerobic culture     Status: None (Preliminary result)   Collection Time: 12/14/14 11:45 AM  Result Value Ref Range Status   Specimen Description WOUND PENIS  Final   Special Requests NONE  Final   Gram Stain   Final    RARE WBC PRESENT,BOTH PMN AND MONONUCLEAR NO SQUAMOUS EPITHELIAL CELLS SEEN NO ORGANISMS SEEN Performed at [facility]    Culture   Final    NO ANAEROBES ISOLATED; CULTURE IN PROGRESS FOR 5 DAYS  Performed at [facility]    Report Status PENDING  Incomplete  Wound culture     Status: None   Collection Time: 12/14/14 11:45 AM  Result Value Ref Range Status   Specimen Description WOUND PENIS  Final   Special Requests NONE  Final   Gram Stain   Final    RARE WBC PRESENT,BOTH PMN AND MONONUCLEAR NO SQUAMOUS EPITHELIAL CELLS SEEN NO ORGANISMS SEEN Performed at [facility]    Culture   Final    FEW CANDIDA ALBICANS Performed at [facility]    Report Status 12/16/2014 FINAL  Final  Clostridium Difficile by PCR     Status: None   Collection Time: 12/15/14 11:49 AM  Result Value Ref Range Status   C difficile by pcr NEGATIVE NEGATIVE Final  Urine culture     Status: None   Collection  Time: 12/17/14 10:50 AM  Result Value Ref Range Status   Specimen Description URINE, RANDOM  Final   Special Requests NONE  Final   Colony Count   Final    >=100,000 COLONIES/ML Performed at [facility]    Culture YEAST Performed at [facility]   Final   Report Status 12/18/2014 FINAL  Final    Studies/Results:   Assessment/Plan: 1. Pyocystitis:  Yeast, Candida growing in bladder. Yeast in wound culture. Pt on vigorous bladder irrigations with 1/4% acetic acid ( 180cc via toomey syringe q shift). Still showing mucoid debris in the tubing. Will need CT -non-contrast to R/o beezor ( fungus ball), and may consider Amphotericin-B bladder irrigation if necessary.  2. Diarrhea: negative C. Dif c/s to date, but pt is HIGH risk.  3. Penile wound infection: Now cleared. Will change dressings to A/D ointment to glans and urethral meatus and foley.  4. Foley catheter is taped to inner thigh with coil to relieve tension on urethra.    LOS: 9 days   Ziaire Bieser I 12/19/2014, 7:18 AM

## 2014-12-19 NOTE — Progress Notes (Addendum)
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   Assessment/Plan: Hyperosmolar non ketotic hyperglycemia upon admission-resolved  - sugars uncontrolled likely as a result of below mentioned infection - Last episode of hypoglycemia 2/70 4:42 AM, CBG stable since - Hemoglobin A1c 15.4 Continue Lantus 30 units daily, Novolog 0-9 units TID with meals, Novolog 7 units TID with meals for meal coverage, no change    Pyocystitis:  urology following, Yeast, Candida growing in bladder. Yeast in wound culture. Pt on vigorous bladder irrigations with 1/4% acetic acid Urine growing greater than 100,000 colonies, culture showing yeast Sensitivity pending , discontinue vancomycin continue imipenem Started fluconazole 2/9 Urology ordered,non-contrast CT to R/o beezor ( fungus ball), and may consider Amphotericin-B bladder irrigation     Penile infection status Post re-drainage of corpora cavernosa with finding of : small amound of pus under the L glans-drained, and irrigated - Blood cultures no growth so far since admission.  - s/p I and D  2/5 Discontinue Vanc and Primaxin, added fluconazole  Continue Dressings changes daily.    Diarrhea-had 5 bowel movements last night Repeat C. difficile PCR, previously negative Continue florastor ,     End-stage renal disease on hemodialysis Nephrology following. dialyzed 3 times a week on Tuesday, Thursday, Saturday, in [place].   Hyponatremia- hyperkalemia - management with hemodialysis  Uinary tract infection? Urine culture from 2/2 negative although UA reveals pyuria.  Repeat urine culture 2/8 showing yeast awaiting sensitivity   Hypertension Continue home regimen of clonidine, Coreg,  Imdur.  Hypothyroidism Continue Synthroid.  Depression Continue Paxil, Celexa, Lyrica.   Code Status: Full Family Communication: No family at bedside. Disposition Plan: f/u on cultures to determine antibiotics on d/c, disposition per urology, refer to their note   Consultants:  Neurology: Dr. Patsi Sears 12/10/2014  Nephrology: Dr. Briant Cedar 12/11/2014  Procedures:  None  Antibiotics:  IV Primaxin 12/10/14  Vancomycin> 2/3  HPI/Subjective: Denies pain, resting comfortably in his room, No diarrhea this morning but had several episodes yesterday   Objective: Filed Vitals:   12/19/14 0930  BP: 107/60  Pulse: 77  Temp: 98.1 F (36.7 C)  Resp: 17    Intake/Output Summary (Last 24 hours) at 12/19/14 1139 Last data filed at 12/19/14 1000  Gross per 24 hour  Intake   2140 ml  Output   6200 ml  Net  -4060 ml   Filed Weights   12/18/14 0800 12/18/14 1215 12/18/14 2030  Weight: 83.5 kg (184 lb 1.4 oz) 78.4 kg (172 lb 13.5 oz) 79.2 kg (174 lb 9.7 oz)    Exam:  General:alert and cooperative GI: not done and soft, non tender, normal bowel sounds, no palpable masses, no organomegaly, no inguinal hernia Male genitalia: not done no bladder distension noted Lungs clear to auscultation bilaterally Extremities: extremities normal, atraumatic, no cyanosis or edema  Data Reviewed: Basic Metabolic Panel:  Recent Labs Lab 12/14/14 0530 12/15/14 1038 12/16/14 1130 12/18/14 0811  NA 134* 129* 132* 129*  K 4.4 5.8* 5.1 5.8*  CL 98 96 93* 97  CO2 26 23 28  18*  GLUCOSE 176* 189* 259* 191*  BUN 29* 48* 31* 61*  CREATININE 3.86* 4.99* 4.13* 6.04*  CALCIUM 7.7* 7.5* 7.7* 7.3*  PHOS  --   --   --  6.1*  Liver Function Tests:  Recent Labs Lab 12/14/14 0530 12/18/14 0811  AST 22  --   ALT 13  --   ALKPHOS 233*  --   BILITOT 0.7  --   PROT 6.1  --   ALBUMIN 2.4* 2.3*   No results for input(s): LIPASE, AMYLASE in the last 168 hours. No results for input(s):  AMMONIA in the last 168 hours. CBC:  Recent Labs Lab 12/14/14 0530 12/15/14 1031 12/18/14 0811  WBC 4.6 7.8 4.6  HGB 10.1* 10.3* 9.7*  HCT 32.2* 32.4* 30.4*  MCV 89.0 88.5 89.9  PLT 79* 82* 74*   Cardiac Enzymes: No results for input(s): CKTOTAL, CKMB, CKMBINDEX, TROPONINI in the last 168 hours. BNP (last 3 results) No results for input(s): BNP in the last 8760 hours.  ProBNP (last 3 results)  Recent Labs  03/26/14 1555  PROBNP 1212.0*    CBG:  Recent Labs Lab 12/18/14 1626 12/18/14 2028 12/19/14 0029 12/19/14 0404 12/19/14 0739  GLUCAP 137* 92 144* 80 106*    Recent Results (from the past 240 hour(s))  Culture, blood (routine x 2)     Status: None   Collection Time: 12/10/14  7:27 PM  Result Value Ref Range Status   Specimen Description BLOOD RIGHT ARM  Final   Special Requests BOTTLES DRAWN AEROBIC AND ANAEROBIC 5CC  Final   Culture   Final    NO GROWTH 5 DAYS Performed at [facility]    Report Status 12/17/2014 FINAL  Final  Culture, blood (routine x 2)     Status: None   Collection Time: 12/10/14  7:54 PM  Result Value Ref Range Status   Specimen Description BLOOD RIGHT HAND  Final   Special Requests BOTTLES DRAWN AEROBIC ONLY 1CC  Final   Culture   Final    NO GROWTH 5 DAYS Note: Culture results may be compromised due to an inadequate volume of blood received in culture bottles. Performed at [facility]    Report Status 12/17/2014 FINAL  Final  MRSA PCR Screening     Status: None   Collection Time: 12/11/14  4:35 AM  Result Value Ref Range Status   MRSA by PCR NEGATIVE NEGATIVE Final    Comment:        The GeneXpert MRSA Assay (FDA approved for NASAL specimens only), is one component of a comprehensive MRSA colonization surveillance program. It is not intended to diagnose MRSA infection nor to guide or monitor treatment for MRSA infections.   Anaerobic culture     Status: None   Collection Time: 12/14/14 11:45 AM   Result Value Ref Range Status   Specimen Description WOUND PENIS  Final   Special Requests NONE  Final   Gram Stain   Final    RARE WBC PRESENT,BOTH PMN AND MONONUCLEAR NO SQUAMOUS EPITHELIAL CELLS SEEN NO ORGANISMS SEEN Performed at [facility]    Culture   Final    NO ANAEROBES ISOLATED Performed at [facility]    Report Status 12/19/2014 FINAL  Final  Wound culture     Status: None   Collection Time: 12/14/14 11:45 AM  Result Value Ref Range Status   Specimen Description WOUND PENIS  Final   Special Requests NONE  Final   Gram Stain   Final    RARE WBC PRESENT,BOTH PMN AND MONONUCLEAR NO SQUAMOUS EPITHELIAL CELLS SEEN NO ORGANISMS SEEN Performed at [facility]   Final  FEW CANDIDA ALBICANS Performed at [facility]    Report Status 12/16/2014 FINAL  Final  Clostridium Difficile by PCR     Status: None   Collection Time: 12/15/14 11:49 AM  Result Value Ref Range Status   C difficile by pcr NEGATIVE NEGATIVE Final  Urine culture     Status: None   Collection Time: 12/17/14 10:50 AM  Result Value Ref Range Status   Specimen Description URINE, RANDOM  Final   Special Requests NONE  Final   Colony Count   Final    >=100,000 COLONIES/ML Performed at [facility]    Culture YEAST Performed at [facility]   Final   Report Status 12/18/2014 FINAL  Final     Studies: No results found.  Scheduled Meds: . acetic acid   Irrigation Q12H  . amitriptyline  25 mg Oral QHS  . aspirin  81 mg Oral q morning - 10a  . carvedilol  25 mg Oral BID WC  . cinacalcet  60 mg Oral Q supper  . citalopram  20 mg Oral Daily  . cloNIDine  0.1 mg Oral BID  . Darbepoetin Alfa  40 mcg Intravenous Q7 days  . doxercalciferol  1.5 mcg Intravenous Q T,Th,Sa-HD  . fluconazole (DIFLUCAN) IV  200 mg Intravenous Daily  . imipenem-cilastatin  250 mg Intravenous Q12H  . insulin aspart  0-9 Units Subcutaneous TID WC  . insulin  aspart  7 Units Subcutaneous TID WC  . insulin glargine  30 Units Subcutaneous Daily  . isosorbide mononitrate  30 mg Oral BID  . levothyroxine  75 mcg Oral QAC breakfast  . multivitamin  1 tablet Oral QHS  . PARoxetine  20 mg Oral q morning - 10a  . pregabalin  150 mg Oral BID  . rosuvastatin  20 mg Oral QHS  . saccharomyces boulardii  250 mg Oral BID  . sevelamer carbonate  1,600 mg Oral TID WC   Continuous Infusions:    Time spent: 30 MINS   Tarnisha Kachmar MD [facility] Pager -  [email or web address], password [facility] 12/19/2014, 11:39 AM  LOS: 9 days

## 2014-12-20 LAB — RENAL FUNCTION PANEL
ALBUMIN: 2.4 g/dL — AB (ref 3.5–5.2)
ANION GAP: 12 (ref 5–15)
BUN: 47 mg/dL — ABNORMAL HIGH (ref 6–23)
CO2: 23 mmol/L (ref 19–32)
Calcium: 7.2 mg/dL — ABNORMAL LOW (ref 8.4–10.5)
Chloride: 97 mmol/L (ref 96–112)
Creatinine, Ser: 5.49 mg/dL — ABNORMAL HIGH (ref 0.50–1.35)
GFR calc Af Amer: 13 mL/min — ABNORMAL LOW (ref >90)
GFR calc non Af Amer: 11 mL/min — ABNORMAL LOW (ref >90)
Glucose, Bld: 114 mg/dL — ABNORMAL HIGH (ref 70–99)
PHOSPHORUS: 5.3 mg/dL — AB (ref 2.3–4.6)
POTASSIUM: 5.9 mmol/L — AB (ref 3.5–5.1)
Sodium: 132 mmol/L — ABNORMAL LOW (ref 135–145)

## 2014-12-20 LAB — GLUCOSE, CAPILLARY
GLUCOSE-CAPILLARY: 159 mg/dL — AB (ref 70–99)
GLUCOSE-CAPILLARY: 146 mg/dL — AB (ref 70–99)
Glucose-Capillary: 100 mg/dL — ABNORMAL HIGH (ref 70–99)
Glucose-Capillary: 110 mg/dL — ABNORMAL HIGH (ref 70–99)
Glucose-Capillary: 74 mg/dL (ref 70–99)
Glucose-Capillary: 76 mg/dL (ref 70–99)

## 2014-12-20 LAB — CBC
HEMATOCRIT: 30 % — AB (ref 39.0–52.0)
HEMOGLOBIN: 9.5 g/dL — AB (ref 13.0–17.0)
MCH: 28.8 pg (ref 26.0–34.0)
MCHC: 31.7 g/dL (ref 30.0–36.0)
MCV: 90.9 fL (ref 78.0–100.0)
Platelets: 86 10*3/uL — ABNORMAL LOW (ref 150–400)
RBC: 3.3 MIL/uL — ABNORMAL LOW (ref 4.22–5.81)
RDW: 15.4 % (ref 11.5–15.5)
WBC: 4.8 10*3/uL (ref 4.0–10.5)

## 2014-12-20 MED ORDER — DOXERCALCIFEROL 4 MCG/2ML IV SOLN
INTRAVENOUS | Status: AC
  Filled 2014-12-20: qty 2

## 2014-12-20 MED ORDER — HEPARIN SODIUM (PORCINE) 1000 UNIT/ML DIALYSIS
20.0000 [IU]/kg | INTRAMUSCULAR | Status: DC | PRN
  Filled 2014-12-20: qty 2

## 2014-12-20 MED ORDER — LOPERAMIDE HCL 2 MG PO CAPS
2.0000 mg | ORAL_CAPSULE | Freq: Four times a day (QID) | ORAL | Status: DC | PRN
  Administered 2014-12-20: 2 mg via ORAL
  Filled 2014-12-20 (×2): qty 1

## 2014-12-20 MED ORDER — DARBEPOETIN ALFA 40 MCG/0.4ML IJ SOSY
PREFILLED_SYRINGE | INTRAMUSCULAR | Status: AC
  Filled 2014-12-20: qty 0.4

## 2014-12-20 NOTE — Progress Notes (Signed)
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   Assessment/Plan: Hyperosmolar non ketotic hyperglycemia upon admission-resolved  - sugars uncontrolled likely as a result of below mentioned infection - Hemoglobin A1c 15.4 Currently on Lantus 30 units daily, Novolog 0-9 units TID with meals, Novolog 7 units TID with meals for meal coverage, no change    Pyocystitis:  urology following, Yeast, Candida growing in bladder. Yeast in wound culture. Pt on vigorous bladder irrigations with 1/4% acetic acid Urine growing greater than 100,000 colonies, culture showing yeast Sensitivity pending , discontinue vancomycin continue imipenem Started fluconazole 2/9 Urology ordered,non-contrast CT to R/o beezor ( fungus ball) which was negative    Penile infection status Post re-drainage of corpora cavernosa with finding of : small amound of pus under the L glans-drained, and irrigated - Blood cultures no growth so far since admission.  - s/p I and D  2/5 Currently on Primaxin, added fluconazole  Continue Dressings changes daily.    Diarrhea-had 5 bowel movements last night Repeat C. difficile PCR, previously negative Continue florastor ,    End-stage renal disease on hemodialysis Nephrology following. dialyzed 3 times a week on Tuesday, Thursday, Saturday, in [place].   Hyponatremia- hyperkalemia - management with hemodialysis  Uinary tract infection? Urine culture from 2/2 negative although UA reveals pyuria.  Repeat urine culture 2/8 showing yeast awaiting sensitivity   Hypertension Continue home regimen of clonidine, Coreg, Imdur.  Hypothyroidism Continue Synthroid.  Depression Continue Paxil, Celexa, Lyrica.   Code Status: Full Family  Communication: No family at bedside. Disposition Plan: Disposition per urology recommendations.   Consultants:  Neurology: Dr. Patsi Sears 12/10/2014  Nephrology: Dr. Briant Cedar 12/11/2014  Procedures:  None  Antibiotics:  IV Primaxin 12/10/14  Vancomycin> 2/3  HPI/Subjective: Has no new complaints no acute issues reported overnight from patient to myself.   Objective: Filed Vitals:   12/20/14 1700  BP: 142/68  Pulse: 90  Temp: 98.8 F (37.1 C)  Resp: 18    Intake/Output Summary (Last 24 hours) at 12/20/14 1820 Last data filed at 12/20/14 1250  Gross per 24 hour  Intake    220 ml  Output   4120 ml  Net  -3900 ml   Filed Weights   12/19/14 2011 12/20/14 0850 12/20/14 1250  Weight: 82.918 kg (182 lb 12.8 oz) 84.1 kg (185 lb 6.5 oz) 80.2 kg (176 lb 12.9 oz)    Exam:  General:alert and cooperative GI: ND, NT Male genitalia: not done no bladder distension noted Lungs clear to auscultation bilaterally, no wheezes Extremities: extremities normal, atraumatic, no cyanosis or edema  Data Reviewed: Basic Metabolic Panel:  Recent Labs Lab 12/14/14 0530 12/15/14 1038 12/16/14 1130 12/18/14 0811 12/20/14 0858  NA 134* 129* 132* 129* 132*  K 4.4 5.8* 5.1 5.8* 5.9*  CL 98 96 93* 97 97  CO2 26 23 28  18* 23  GLUCOSE 176* 189* 259* 191* 114*  BUN 29* 48* 31* 61* 47*  CREATININE 3.86* 4.99* 4.13* 6.04* 5.49*  CALCIUM 7.7* 7.5* 7.7* 7.3* 7.2*  PHOS  --   --   --  6.1* 5.3*   Liver Function Tests:  Recent Labs Lab 12/14/14 0530 12/18/14 0811 12/20/14 0858  AST 22  --   --   ALT 13  --   --  ALKPHOS 233*  --   --   BILITOT 0.7  --   --   PROT 6.1  --   --   ALBUMIN 2.4* 2.3* 2.4*   No results for input(s): LIPASE, AMYLASE in the last 168 hours. No results for input(s): AMMONIA in the last 168 hours. CBC:  Recent Labs Lab 12/14/14 0530 12/15/14 1031 12/18/14 0811 12/20/14 0858  WBC 4.6 7.8 4.6 4.8  HGB 10.1* 10.3* 9.7* 9.5*  HCT 32.2* 32.4*  30.4* 30.0*  MCV 89.0 88.5 89.9 90.9  PLT 79* 82* 74* 86*   Cardiac Enzymes: No results for input(s): CKTOTAL, CKMB, CKMBINDEX, TROPONINI in the last 168 hours. BNP (last 3 results) No results for input(s): BNP in the last 8760 hours.  ProBNP (last 3 results)  Recent Labs  03/26/14 1555  PROBNP 1212.0*    CBG:  Recent Labs Lab 12/20/14 0012 12/20/14 0410 12/20/14 0803 12/20/14 1341 12/20/14 1623  GLUCAP 76 110* 100* 74 159*    Recent Results (from the past 240 hour(s))  Culture, blood (routine x 2)     Status: None   Collection Time: 12/10/14  7:27 PM  Result Value Ref Range Status   Specimen Description BLOOD RIGHT ARM  Final   Special Requests BOTTLES DRAWN AEROBIC AND ANAEROBIC 5CC  Final   Culture   Final    NO GROWTH 5 DAYS Performed at [facility]    Report Status 12/17/2014 FINAL  Final  Culture, blood (routine x 2)     Status: None   Collection Time: 12/10/14  7:54 PM  Result Value Ref Range Status   Specimen Description BLOOD RIGHT HAND  Final   Special Requests BOTTLES DRAWN AEROBIC ONLY 1CC  Final   Culture   Final    NO GROWTH 5 DAYS Note: Culture results may be compromised due to an inadequate volume of blood received in culture bottles. Performed at [facility]    Report Status 12/17/2014 FINAL  Final  MRSA PCR Screening     Status: None   Collection Time: 12/11/14  4:35 AM  Result Value Ref Range Status   MRSA by PCR NEGATIVE NEGATIVE Final    Comment:        The GeneXpert MRSA Assay (FDA approved for NASAL specimens only), is one component of a comprehensive MRSA colonization surveillance program. It is not intended to diagnose MRSA infection nor to guide or monitor treatment for MRSA infections.   Anaerobic culture     Status: None   Collection Time: 12/14/14 11:45 AM  Result Value Ref Range Status   Specimen Description WOUND PENIS  Final   Special Requests NONE  Final   Gram Stain   Final    RARE WBC  PRESENT,BOTH PMN AND MONONUCLEAR NO SQUAMOUS EPITHELIAL CELLS SEEN NO ORGANISMS SEEN Performed at [facility]    Culture   Final    NO ANAEROBES ISOLATED Performed at [facility]    Report Status 12/19/2014 FINAL  Final  Wound culture     Status: None   Collection Time: 12/14/14 11:45 AM  Result Value Ref Range Status   Specimen Description WOUND PENIS  Final   Special Requests NONE  Final   Gram Stain   Final    RARE WBC PRESENT,BOTH PMN AND MONONUCLEAR NO SQUAMOUS EPITHELIAL CELLS SEEN NO ORGANISMS SEEN Performed at [facility]    Culture   Final    FEW CANDIDA ALBICANS Performed at [facility]    Report Status 12/16/2014 FINAL  Final  Clostridium Difficile by PCR     Status: None   Collection Time: 12/15/14 11:49 AM  Result Value Ref Range Status   C difficile by pcr NEGATIVE NEGATIVE Final  Urine culture     Status: None   Collection Time: 12/17/14 10:50 AM  Result Value Ref Range Status   Specimen Description URINE, RANDOM  Final   Special Requests NONE  Final   Colony Count   Final    >=100,000 COLONIES/ML Performed at [facility]    Culture YEAST Performed at [facility]   Final   Report Status 12/18/2014 FINAL  Final  Clostridium Difficile by PCR     Status: None   Collection Time: 12/19/14  2:27 PM  Result Value Ref Range Status   C difficile by pcr NEGATIVE NEGATIVE Final     Studies: Ct Abdomen Pelvis Wo Contrast  12/19/2014   CLINICAL DATA:  Urinary tract infection. Dialysis patient with pyocystitis and funguria. Not clearing with irrigation or debridement. History of end-stage renal disease, diabetes, hypertension, hiatal hernia, anemia, CHF.  EXAM: CT ABDOMEN AND PELVIS WITHOUT CONTRAST  TECHNIQUE: Multidetector CT imaging of the abdomen and pelvis was performed following the standard protocol without IV contrast.  COMPARISON:  08/22/2014  FINDINGS: Coronary artery calcification. Cardiac  enlargement. Postoperative changes in the mediastinum. Mild dependent atelectasis in the lung bases. Right lung base nodule measuring 5.3 and 5 mm. Left lung base nodules measuring 4.7 and 8.7 mm. Nodules appear to been present on previous study but or less well-defined due to motion artifact. If the patient is at high risk for bronchogenic carcinoma, follow-up chest CT at 6-12 months is recommended. If the patient is at low risk for bronchogenic carcinoma, follow-up chest CT at 12 months is recommended. This recommendation follows the consensus statement: Guidelines for Management of Small Pulmonary Nodules Detected on CT Scans: A Statement from the Fleischner Society as published in Radiology 2005;237:395-400.  The unenhanced appearance of the liver, spleen, gallbladder, pancreas, adrenal glands, inferior vena cava, and retroperitoneal lymph nodes is unremarkable. Kidneys are atrophic without hydronephrosis. Normal caliber abdominal aorta with mild calcification. Extensive calcification in the celiac axis, SMA, and IMA branches. Stomach is filled with ingested material but no significant gastric distention or wall thickening is appreciated. Small bowel are mostly decompressed. No evidence of bowel distention. Decompression limits evaluation of bowel wall. Stool-filled colon without abnormal distention. Small amount of free fluid in the upper abdomen, mesentery, and pelvis. This is similar to prior study. No free air in the abdomen.  Pelvis: Prostate gland is not enlarged. Bladder is decompressed with a Foley catheter limiting evaluation. The penile implant has been removed since the previous study. Appendix is not identified. No pelvic mass or lymphadenopathy. Vascular calcifications. No destructive bone lesions appreciated. Mild diffuse edema in the soft tissues.  IMPRESSION: Indeterminate nodules in the lung bases. See above followup recommendations. Small amount of free fluid in the abdomen and pelvis, similar  to prior study. This may be due to fluid overload or inflammatory process or anasarca. Bladder is decompressed with a Foley catheter, limiting evaluation. Extensive vascular calcifications.   Electronically Signed   By: Burman Nieves M.D.   On: 12/19/2014 17:34    Scheduled Meds: . acetic acid   Irrigation Q12H  . amitriptyline  25 mg Oral QHS  . aspirin  81 mg Oral q morning - 10a  . carvedilol  25  mg Oral BID WC  . cinacalcet  60 mg Oral Q supper  . citalopram  20 mg Oral Daily  . cloNIDine  0.1 mg Oral BID  . Darbepoetin Alfa  40 mcg Intravenous Q7 days  . doxercalciferol      . doxercalciferol  1.5 mcg Intravenous Q T,Th,Sa-HD  . fluconazole (DIFLUCAN) IV  200 mg Intravenous Daily  . imipenem-cilastatin  250 mg Intravenous Q12H  . insulin aspart  0-9 Units Subcutaneous TID WC  . insulin aspart  7 Units Subcutaneous TID WC  . insulin glargine  30 Units Subcutaneous Daily  . isosorbide mononitrate  30 mg Oral BID  . levothyroxine  75 mcg Oral QAC breakfast  . multivitamin  1 tablet Oral QHS  . PARoxetine  20 mg Oral q morning - 10a  . pregabalin  150 mg Oral BID  . rosuvastatin  20 mg Oral QHS  . saccharomyces boulardii  250 mg Oral BID  . sevelamer carbonate  1,600 mg Oral TID WC   Continuous Infusions:    Time spent: 25 MINS   Penny Pia MD [facility] Pager [phone] -  [email or web address], password [name] 12/20/2014, 6:20 PM  LOS: 10 days

## 2014-12-20 NOTE — Clinical Documentation Improvement (Signed)
Please specify diagnosis related to below supporting information if appropriate.   Possible Clinical Conditions?  Chronic Systolic Congestive Heart Failure Chronic Diastolic Congestive Heart Failure Chronic Systolic & Diastolic Congestive Heart Failure Acute Systolic Congestive Heart Failure Acute Diastolic Congestive Heart Failure Acute Systolic & Diastolic Congestive Heart Failure Acute on Chronic Systolic Congestive Heart Failure Acute on Chronic Diastolic Congestive Heart Failure Acute on Chronic Systolic & Diastolic Congestive Heart Failure Other Condition________________________________________ Cannot Clinically Determine  Supporting Information:   CT ABDOMEN PELVIS WO CONTRAST 12/19/2014    History of end-stage renal disease, diabetes, hypertension, hiatal hernia, anemia, CHF.  Anes Pre-op 12/14/2014    + CAD, + Past MI, + CABG, + Peripheral Vascular Disease and +CHF Rhythm:Regular Rate:Normal  Consult Note 12/11/2014   HPI:  CHF (congestive heart failure)   ED Prov Note 12/11/2014   HPI Comments:  CHF (congestive heart failure)   PROGRESS 12/10/2014   Medical History:  CHF (congestive heart failure)   H&P 12/10/2014   HPI:  44 year old male who has a past medical history of End stage renal disease on dialysis; Type I diabetes mellitus; Diabetic neuropathy; Hypothyroidism; Hypertension; Hyperlipidemia; H/O hiatal hernia; GERD (gastroesophageal reflux disease); Anxiety; Sebaceous cyst; Pneumonia; Anemia; PAD (peripheral artery disease); Coronary artery disease; Cataract; Valvular disease; CHF (congestive heart failure); Depression; Arthritis; transfusion of packed red blood cells; and Myocardial infarction (2010). Allergies:  CHF (congestive heart failure)     Thank You, Shelda Pal ,RN Clinical Documentation Specialist:  [phone]  [facility]- Health Information Management

## 2014-12-20 NOTE — Procedures (Signed)
I was present at this dialysis session. I have reviewed the session itself and made appropriate changes.   Post weight 2/9 was 78.4kg, today is 84.1kg pre HD standing.  Minimal documented intake.  Goal UF 4L,  BP soft.  No co, appears well.  Using AVF.  Next HD 2/13. RS  Sabra Heck  MD 12/20/2014, 9:04 AM

## 2014-12-20 NOTE — Progress Notes (Signed)
ANTIBIOTIC CONSULT NOTE - FOLLOW UP  Pharmacy Consult for primaxin Indication: pyocystitis  Allergies  Allergen Reactions  . Amoxicillin-Pot Clavulanate Nausea And Vomiting  . Hydromorphone Hcl     "body started down"  08/22/14 - pt states no longer has problems with this  . Rifampin Nausea And Vomiting    Patient Measurements: Height: 6\' 3"  (190.5 cm) Weight: 176 lb 12.9 oz (80.2 kg) IBW/kg (Calculated) : 84.5  Vital Signs: Temp: 98.3 F (36.8 C) (02/11 1250) Temp Source: Oral (02/11 1250) BP: 118/65 mmHg (02/11 1250) Pulse Rate: 78 (02/11 1250) Intake/Output from previous day: 02/10 0701 - 02/11 0700 In: 900 [[name].O.:800; IV Piggyback:100] Out: 220 [Urine:220] Intake/Output from this shift: Total I/O In: -  Out: 3900 [Other:3900]  Labs:  Recent Labs  12/18/14 0811 12/20/14 0858  WBC 4.6 4.8  HGB 9.7* 9.5*  PLT 74* 86*  CREATININE 6.04* 5.49*   Estimated Creatinine Clearance: 19.3 mL/min (by C-G formula based on Cr of 5.49). No results for input(s): VANCOTROUGH, VANCOPEAK, VANCORANDOM, GENTTROUGH, GENTPEAK, GENTRANDOM, TOBRATROUGH, TOBRAPEAK, TOBRARND, AMIKACINPEAK, AMIKACINTROU, AMIKACIN in the last 72 hours.   Microbiology: Recent Results (from the past 720 hour(s))  Culture, blood (routine x 2)     Status: None   Collection Time: 12/10/14  7:27 PM  Result Value Ref Range Status   Specimen Description BLOOD RIGHT ARM  Final   Special Requests BOTTLES DRAWN AEROBIC AND ANAEROBIC 5CC  Final   Culture   Final    NO GROWTH 5 DAYS Performed at [facility]    Report Status 12/17/2014 FINAL  Final  Culture, blood (routine x 2)     Status: None   Collection Time: 12/10/14  7:54 PM  Result Value Ref Range Status   Specimen Description BLOOD RIGHT HAND  Final   Special Requests BOTTLES DRAWN AEROBIC ONLY 1CC  Final   Culture   Final    NO GROWTH 5 DAYS Note: Culture results may be compromised due to an inadequate volume of blood received in culture  bottles. Performed at [facility]    Report Status 12/17/2014 FINAL  Final  MRSA PCR Screening     Status: None   Collection Time: 12/11/14  4:35 AM  Result Value Ref Range Status   MRSA by PCR NEGATIVE NEGATIVE Final    Comment:        The GeneXpert MRSA Assay (FDA approved for NASAL specimens only), is one component of a comprehensive MRSA colonization surveillance program. It is not intended to diagnose MRSA infection nor to guide or monitor treatment for MRSA infections.   Anaerobic culture     Status: None   Collection Time: 12/14/14 11:45 AM  Result Value Ref Range Status   Specimen Description WOUND PENIS  Final   Special Requests NONE  Final   Gram Stain   Final    RARE WBC PRESENT,BOTH PMN AND MONONUCLEAR NO SQUAMOUS EPITHELIAL CELLS SEEN NO ORGANISMS SEEN Performed at [facility]    Culture   Final    NO ANAEROBES ISOLATED Performed at [facility]    Report Status 12/19/2014 FINAL  Final  Wound culture     Status: None   Collection Time: 12/14/14 11:45 AM  Result Value Ref Range Status   Specimen Description WOUND PENIS  Final   Special Requests NONE  Final   Gram Stain   Final    RARE WBC PRESENT,BOTH PMN AND MONONUCLEAR NO SQUAMOUS EPITHELIAL CELLS SEEN NO ORGANISMS SEEN  Performed at [facility]    Culture   Final    FEW CANDIDA ALBICANS Performed at [facility]    Report Status 12/16/2014 FINAL  Final  Clostridium Difficile by PCR     Status: None   Collection Time: 12/15/14 11:49 AM  Result Value Ref Range Status   C difficile by pcr NEGATIVE NEGATIVE Final  Urine culture     Status: None   Collection Time: 12/17/14 10:50 AM  Result Value Ref Range Status   Specimen Description URINE, RANDOM  Final   Special Requests NONE  Final   Colony Count   Final    >=100,000 COLONIES/ML Performed at [facility]    Culture YEAST Performed at [facility]   Final   Report Status  12/18/2014 FINAL  Final  Clostridium Difficile by PCR     Status: None   Collection Time: 12/19/14  2:27 PM  Result Value Ref Range Status   C difficile by pcr NEGATIVE NEGATIVE Final    Anti-infectives    Start     Dose/Rate Route Frequency Ordered Stop   12/18/14 1600  fluconazole (DIFLUCAN) IVPB 200 mg     200 mg 100 mL/hr over 60 Minutes Intravenous Daily 12/18/14 1510     12/17/14 2000  imipenem-cilastatin (PRIMAXIN) 250 mg in sodium chloride 0.9 % 100 mL IVPB     250 mg 200 mL/hr over 30 Minutes Intravenous Every 12 hours 12/17/14 1117     12/14/14 0900  gentamicin (GARAMYCIN) 160 mg in dextrose 5 % 50 mL IVPB     160 mg 104 mL/hr over 60 Minutes Intravenous 30 min pre-op 12/13/14 1515 12/14/14 1130   12/14/14 0900  clindamycin (CLEOCIN) IVPB 900 mg     900 mg 100 mL/hr over 30 Minutes Intravenous 30 min pre-op 12/13/14 1518 12/14/14 1125   12/13/14 1439  clindamycin (CLEOCIN) IVPB 900 mg  Status:  Discontinued     900 mg 100 mL/hr over 30 Minutes Intravenous 30 min pre-op 12/13/14 1439 12/13/14 1518   12/13/14 1439  gentamicin (GARAMYCIN) 400 mg in dextrose 5 % 100 mL IVPB  Status:  Discontinued     5 mg/kg  79.9 kg 110 mL/hr over 60 Minutes Intravenous 30 min pre-op 12/13/14 1439 12/13/14 1515   12/13/14 1200  vancomycin (VANCOCIN) IVPB 1000 mg/200 mL premix  Status:  Discontinued     1,000 mg 200 mL/hr over 60 Minutes Intravenous Every T-Th-Sa (Hemodialysis) 12/12/14 1259 12/19/14 0923   12/12/14 1000  vancomycin (VANCOCIN) 1,500 mg in sodium chloride 0.9 % 250 mL IVPB     1,500 mg 250 mL/hr over 60 Minutes Intravenous  Once 12/12/14 0905 12/12/14 1208   12/10/14 1400  imipenem-cilastatin (PRIMAXIN) 250 mg in sodium chloride 0.9 % 100 mL IVPB  Status:  Discontinued     250 mg 200 mL/hr over 30 Minutes Intravenous Every 6 hours 12/10/14 1312 12/17/14 1117      Assessment: Patient is a 44 y.o M with ESRD on HD with pyocystitis s/[name] I&D of L + R corpora carvernosa on 2/5  currently on primaxin day #10 and fluconazole day #3 for candida albicans in wound culture.  He's also receiving acetic acid bladder irrigations and appears to be responding to therapy.   Primaxin  2/1>> Vanc 2/3>>2/10 flucon 2/9>>  2/1 blood x 2 - neg FINAL 2/1 C diff - neg 2/5 wound - few candida albicans FINAL 2/5 anaerobic wound cx- neg FINAL 2/8 UCX- >  100K yeast FINAL 2/10 cdiff (-)   Plan:  - cont primaxin to 250 mg IV q12h  - cont fluconazole 200mg  q24h - please indicate LOT for abx   Taylor [name] 12/20/2014,1:38 PM

## 2014-12-20 NOTE — Progress Notes (Signed)
Foley irrigated with1/4 acetic acid. Urine becoming more clear.

## 2014-12-20 NOTE — Progress Notes (Signed)
Pt HD tx completed w/ no complications. 3.9 L of fluid removed. Report called to 6E primary RN

## 2014-12-20 NOTE — Progress Notes (Signed)
6 Days Post-Op Subjective: 1. Pyocystitis: bladder irrigations, more vigorous. Discussed with RN this AM. Urine clearing with irrigation debridement. 2. CT NEG. For beezor. .  Objective: Vital signs in last 24 hours: Temp:  [98 F (36.7 C)-98.4 F (36.9 C)] 98.4 F (36.9 C) (02/11 0850) Pulse Rate:  [71-77] 74 (02/11 0855) Resp:  [16-18] 16 (02/11 0850) BP: (98-107)/(58-63) 101/63 mmHg (02/11 0855) SpO2:  [98 %-100 %] 98 % (02/11 0850) Weight:  [82.918 kg (182 lb 12.8 oz)-84.1 kg (185 lb 6.5 oz)] 84.1 kg (185 lb 6.5 oz) (02/11 0850)  Intake/Output from previous day: 02/10 0701 - 02/11 0700 In: 900 [P.O.:800; IV Piggyback:100] Out: 220 [Urine:220] Intake/Output this shift:    Lab Results:  Recent Labs  12/18/14 0811  HGB 9.7*  HCT 30.4*   BMET  Recent Labs  12/18/14 0811  NA 129*  K 5.8*  CL 97  CO2 18*  GLUCOSE 191*  BUN 61*  CREATININE 6.04*  CALCIUM 7.3*   No results for input(s): LABPT, INR in the last 72 hours. No results for input(s): LABURIN in the last 72 hours. Results for orders placed or performed during the hospital encounter of 12/10/14  Culture, blood (routine x 2)     Status: None   Collection Time: 12/10/14  7:27 PM  Result Value Ref Range Status   Specimen Description BLOOD RIGHT ARM  Final   Special Requests BOTTLES DRAWN AEROBIC AND ANAEROBIC 5CC  Final   Culture   Final    NO GROWTH 5 DAYS Performed at [facility]    Report Status 12/17/2014 FINAL  Final  Culture, blood (routine x 2)     Status: None   Collection Time: 12/10/14  7:54 PM  Result Value Ref Range Status   Specimen Description BLOOD RIGHT HAND  Final   Special Requests BOTTLES DRAWN AEROBIC ONLY 1CC  Final   Culture   Final    NO GROWTH 5 DAYS Note: Culture results may be compromised due to an inadequate volume of blood received in culture bottles. Performed at [facility]    Report Status 12/17/2014 FINAL  Final  MRSA PCR Screening     Status:  None   Collection Time: 12/11/14  4:35 AM  Result Value Ref Range Status   MRSA by PCR NEGATIVE NEGATIVE Final    Comment:        The GeneXpert MRSA Assay (FDA approved for NASAL specimens only), is one component of a comprehensive MRSA colonization surveillance program. It is not intended to diagnose MRSA infection nor to guide or monitor treatment for MRSA infections.   Anaerobic culture     Status: None   Collection Time: 12/14/14 11:45 AM  Result Value Ref Range Status   Specimen Description WOUND PENIS  Final   Special Requests NONE  Final   Gram Stain   Final    RARE WBC PRESENT,BOTH PMN AND MONONUCLEAR NO SQUAMOUS EPITHELIAL CELLS SEEN NO ORGANISMS SEEN Performed at [facility]    Culture   Final    NO ANAEROBES ISOLATED Performed at [facility]    Report Status 12/19/2014 FINAL  Final  Wound culture     Status: None   Collection Time: 12/14/14 11:45 AM  Result Value Ref Range Status   Specimen Description WOUND PENIS  Final   Special Requests NONE  Final   Gram Stain   Final    RARE WBC PRESENT,BOTH PMN AND MONONUCLEAR NO SQUAMOUS EPITHELIAL CELLS  SEEN NO ORGANISMS SEEN Performed at [facility]    Culture   Final    FEW CANDIDA ALBICANS Performed at [facility]    Report Status 12/16/2014 FINAL  Final  Clostridium Difficile by PCR     Status: None   Collection Time: 12/15/14 11:49 AM  Result Value Ref Range Status   C difficile by pcr NEGATIVE NEGATIVE Final  Urine culture     Status: None   Collection Time: 12/17/14 10:50 AM  Result Value Ref Range Status   Specimen Description URINE, RANDOM  Final   Special Requests NONE  Final   Colony Count   Final    >=100,000 COLONIES/ML Performed at [facility]    Culture YEAST Performed at [facility]   Final   Report Status 12/18/2014 FINAL  Final  Clostridium Difficile by PCR     Status: None   Collection Time: 12/19/14  2:27 PM  Result Value Ref  Range Status   C difficile by pcr NEGATIVE NEGATIVE Final    Studies/Results: Ct Abdomen Pelvis Wo Contrast  12/19/2014   CLINICAL DATA:  Urinary tract infection. Dialysis patient with pyocystitis and funguria. Not clearing with irrigation or debridement. History of end-stage renal disease, diabetes, hypertension, hiatal hernia, anemia, CHF.  EXAM: CT ABDOMEN AND PELVIS WITHOUT CONTRAST  TECHNIQUE: Multidetector CT imaging of the abdomen and pelvis was performed following the standard protocol without IV contrast.  COMPARISON:  08/22/2014  FINDINGS: Coronary artery calcification. Cardiac enlargement. Postoperative changes in the mediastinum. Mild dependent atelectasis in the lung bases. Right lung base nodule measuring 5.3 and 5 mm. Left lung base nodules measuring 4.7 and 8.7 mm. Nodules appear to been present on previous study but or less well-defined due to motion artifact. If the patient is at high risk for bronchogenic carcinoma, follow-up chest CT at 6-12 months is recommended. If the patient is at low risk for bronchogenic carcinoma, follow-up chest CT at 12 months is recommended. This recommendation follows the consensus statement: Guidelines for Management of Small Pulmonary Nodules Detected on CT Scans: A Statement from the Fleischner Society as published in Radiology 2005;237:395-400.  The unenhanced appearance of the liver, spleen, gallbladder, pancreas, adrenal glands, inferior vena cava, and retroperitoneal lymph nodes is unremarkable. Kidneys are atrophic without hydronephrosis. Normal caliber abdominal aorta with mild calcification. Extensive calcification in the celiac axis, SMA, and IMA branches. Stomach is filled with ingested material but no significant gastric distention or wall thickening is appreciated. Small bowel are mostly decompressed. No evidence of bowel distention. Decompression limits evaluation of bowel wall. Stool-filled colon without abnormal distention. Small amount of free  fluid in the upper abdomen, mesentery, and pelvis. This is similar to prior study. No free air in the abdomen.  Pelvis: Prostate gland is not enlarged. Bladder is decompressed with a Foley catheter limiting evaluation. The penile implant has been removed since the previous study. Appendix is not identified. No pelvic mass or lymphadenopathy. Vascular calcifications. No destructive bone lesions appreciated. Mild diffuse edema in the soft tissues.  IMPRESSION: Indeterminate nodules in the lung bases. See above followup recommendations. Small amount of free fluid in the abdomen and pelvis, similar to prior study. This may be due to fluid overload or inflammatory process or anasarca. Bladder is decompressed with a Foley catheter, limiting evaluation. Extensive vascular calcifications.   Electronically Signed   By: Burman Nieves M.D.   On: 12/19/2014 17:34    Assessment/Plan: 1. Candida funguria, responding to bladder irrigations.  I wil ck him on Friday ( off today), and, if urine is clear, wil plan for foley d/c 2. He may start preparing for discharge per medicine pending Rx for  diarrhea, dialysis, etc.    LOS: 10 days   Vannesa Abair I 12/20/2014, 9:05 AM

## 2014-12-21 LAB — GLUCOSE, CAPILLARY
GLUCOSE-CAPILLARY: 155 mg/dL — AB (ref 70–99)
Glucose-Capillary: 103 mg/dL — ABNORMAL HIGH (ref 70–99)
Glucose-Capillary: 210 mg/dL — ABNORMAL HIGH (ref 70–99)
Glucose-Capillary: 59 mg/dL — ABNORMAL LOW (ref 70–99)
Glucose-Capillary: 76 mg/dL (ref 70–99)

## 2014-12-21 MED ORDER — SACCHAROMYCES BOULARDII 250 MG PO CAPS: 250.0000 mg | ORAL_CAPSULE | Freq: Two times a day (BID) | ORAL | Status: AC

## 2014-12-21 MED ORDER — FLUCONAZOLE 200 MG PO TABS: 200.0000 mg | ORAL_TABLET | Freq: Every day | ORAL | Status: AC

## 2014-12-21 MED ORDER — INSULIN ASPART 100 UNIT/ML ~~LOC~~ SOLN: 7.0000 [IU] | Freq: Three times a day (TID) | SUBCUTANEOUS | Status: DC

## 2014-12-21 MED ORDER — INSULIN GLARGINE 100 UNIT/ML ~~LOC~~ SOLN: 30.0000 [IU] | Freq: Every day | SUBCUTANEOUS | Status: DC

## 2014-12-21 MED ORDER — CALCIUM CARBONATE ANTACID 500 MG PO CHEW: 1.0000 | CHEWABLE_TABLET | Freq: Two times a day (BID) | ORAL | Status: DC | PRN

## 2014-12-21 MED ORDER — HYDROCODONE-ACETAMINOPHEN 5-325 MG PO TABS: 1.0000 | ORAL_TABLET | ORAL | Status: DC | PRN

## 2014-12-21 NOTE — Progress Notes (Signed)
Admit: 12/10/2014 LOS: 11  40M ESRD ([facility], [facility]) admit with infected penis (recent prosthesis), HONK, candidal pyocystitis  Subjective:  HD yesterday, 3.9L UF, no complications Post Weight 80.2kg No edema   02/11 0701 - 02/12 0700 In: 700 [P.O.:600; IV Piggyback:100] Out: 4101 [Urine:200; Stool:1]  Filed Weights   12/20/14 0850 12/20/14 1250 12/20/14 2007  Weight: 84.1 kg (185 lb 6.5 oz) 80.2 kg (176 lb 12.9 oz) 80.2 kg (176 lb 12.9 oz)    Scheduled Meds: . acetic acid   Irrigation Q12H  . amitriptyline  25 mg Oral QHS  . aspirin  81 mg Oral q morning - 10a  . carvedilol  25 mg Oral BID WC  . cinacalcet  60 mg Oral Q supper  . citalopram  20 mg Oral Daily  . cloNIDine  0.1 mg Oral BID  . Darbepoetin Alfa  40 mcg Intravenous Q7 days  . doxercalciferol  1.5 mcg Intravenous Q T,Th,Sa-HD  . fluconazole (DIFLUCAN) IV  200 mg Intravenous Daily  . insulin aspart  0-9 Units Subcutaneous TID WC  . insulin aspart  7 Units Subcutaneous TID WC  . insulin glargine  30 Units Subcutaneous Daily  . isosorbide mononitrate  30 mg Oral BID  . levothyroxine  75 mcg Oral QAC breakfast  . multivitamin  1 tablet Oral QHS  . PARoxetine  20 mg Oral q morning - 10a  . pregabalin  150 mg Oral BID  . rosuvastatin  20 mg Oral QHS  . saccharomyces boulardii  250 mg Oral BID  . sevelamer carbonate  1,600 mg Oral TID WC   Continuous Infusions:  PRN Meds:.acetaminophen, calcium carbonate, HYDROcodone-acetaminophen, loperamide, vitamin A & D  Current Labs: reviewed   Physical Exam:  Blood pressure 115/66, pulse 74, temperature 98.8 F (37.1 C), temperature source Oral, resp. rate 17, height 6\' 3"  (1.905 m), weight 80.2 kg (176 lb 12.9 oz), SpO2 98 %. NAD RRR CTAB LUA AVG +B/T No LEE R TMA noted  Nonfocal NO rashes/lesions  A/P 1. ESRD 1. [facility], [facility], 3.5h, Qb 450, EDW 77.5kg, AVG 2. Cont on schedule 3. No heparin 4. Target Weight 79.5kg 2. HTN / Vol 1. BP  controlled 2. DW 79.5kg, higher than outpt (?foley present) 3. Anemia:  1. Cont ESA 2. 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 Fungal pyocystitis 6. Penile infection  MD 12/21/2014, 12:05 PM   Recent Labs Lab 12/16/14 1130 12/18/14 0811 12/20/14 0858  NA 132* 129* 132*  K 5.1 5.8* 5.9*  CL 93* 97 97  CO2 28 18* 23  GLUCOSE 259* 191* 114*  BUN 31* 61* 47*  CREATININE 4.13* 6.04* 5.49*  CALCIUM 7.7* 7.3* 7.2*  PHOS  --  6.1* 5.3*    Recent Labs Lab 12/15/14 1031 12/18/14 0811 12/20/14 0858  WBC 7.8 4.6 4.8  HGB 10.3* 9.7* 9.5*  HCT 32.4* 30.4* 30.0*  MCV 88.5 89.9 90.9  PLT 82* 74* 86*

## 2014-12-21 NOTE — Care Management Note (Signed)
CARE MANAGEMENT NOTE 12/21/2014  Patient:  Taylor [name], Taylor [name]   Account Number:  [id number]  Date Initiated:  12/17/2014  Documentation initiated by:  Aseel Truxillo  Subjective/Objective Assessment:   CM following for progression and d/c planning.     Action/Plan:   Met with pt will arrange any HH services as ordered.  12/21/14 Met with pt plan to d/c to home.   Anticipated DC Date:  12/24/2014   Anticipated DC Plan:  [place]         Choice offered to / List presented to:             Status of service:  In process, will continue to follow Medicare Important Message given?  YES (If response is "NO", the following Medicare IM given date fields will be blank) Date Medicare IM given:  12/17/2014 Medicare IM given by:  Danitra Payano Date Additional Medicare IM given:  12/21/2014 Additional Medicare IM given by:  [facility]  Discharge Disposition:    Per UR Regulation:    If discussed at Long Length of Stay Meetings, dates discussed:    Comments:

## 2014-12-21 NOTE — Discharge Summary (Signed)
Physician Discharge Summary  TANISH PRIEN MRN: [id number] DOB/AGE: [birth year] 46 y.o.  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     amitriptyline 25 MG tablet  Commonly known as:  ELAVIL  Take 1 tablet (25 mg total) by mouth at bedtime.     aspirin 81 MG tablet  Take 81 mg by mouth every morning.     calcium carbonate 500 MG chewable tablet  Commonly known as:  TUMS - dosed in mg elemental calcium  Chew 1-2 tablets (200-400 mg of elemental calcium total) by mouth 2 (two) times daily as needed for indigestion or heartburn.     carvedilol 25 MG tablet  Commonly known as:  COREG  Take 25 mg by mouth 2 (two) times daily with a meal.     cinacalcet 60 MG tablet  Commonly known as:  SENSIPAR  Take 60 mg by mouth at bedtime. With biggest meal     citalopram 20 MG tablet  Commonly known as:  CELEXA  TAKE 1 BY MOUTH DAILY     cloNIDine 0.1 MG tablet  Commonly known as:  CATAPRES  Take 0.1 mg by mouth 2 (two) times daily.     feeding supplement (NEPRO CARB STEADY) Liqd  Take  237 mLs by mouth as needed (missed meal during dialysis.).     fluconazole 200 MG tablet  Commonly known as:  DIFLUCAN  Take 1 tablet (200 mg total) by mouth daily.     furosemide 80 MG tablet  Commonly known as:  LASIX  Take 80 mg by mouth 2 (two) times daily.     HYDROcodone-acetaminophen 5-325 MG per tablet  Commonly known as:  NORCO/VICODIN  Take 1 tablet by mouth every 4 (four) hours as needed for moderate pain.     insulin aspart 100 UNIT/ML injection  Commonly known as:  novoLOG  Inject 7 Units into the skin 3 (three) times daily with meals.     insulin glargine 100 UNIT/ML injection  Commonly known as:  LANTUS  Inject 0.3 mLs (30 Units total) into the skin daily.     isosorbide mononitrate 30 MG 24 hr tablet  Commonly known as:  IMDUR  Take 30 mg by mouth 2 (two) times daily.     levothyroxine 75 MCG tablet  Commonly known as:  SYNTHROID, LEVOTHROID  Take 1 tablet (75 mcg total) by mouth daily before breakfast.     LYRICA 150 MG capsule  Generic drug:  pregabalin  TAKE 1 CAPSULE BY MOUTH EVERY MORNING AND 2 CAPSULE EVERY EVENING     nystatin-triamcinolone cream  Commonly known as:  MYCOLOG II  Apply 1 application topically every 8 (eight) hours as needed (rash).     OMEGA 3 PO  Take 1,000 mg by mouth daily.     omeprazole 40 MG capsule  Commonly known as:  PRILOSEC  Take 40 mg by mouth every morning.     PARoxetine 20 MG tablet  Commonly known as:  PAXIL  Take 20 mg by mouth every morning.     rosuvastatin 20 MG tablet  Commonly known as:  CRESTOR  Take 1 tablet (20 mg total) by mouth at bedtime.     saccharomyces boulardii 250 MG capsule  Commonly known as:  FLORASTOR  Take 1 capsule (250 mg total) by mouth 2 (two) times daily.     sevelamer carbonate 800 MG tablet  Commonly known as:  RENVELA  Take 800 mg by mouth 3 (three) times daily with meals.        Discharge Condition:   Disposition: 30-Still a Patient   Consults:   Consultants:  Neurology: Dr. Gaynelle Arabian 12/10/2014  Nephrology: Dr. Mercy Moore 12/11/2014  Procedures:  None  Antibiotics:      Significant Diagnostic Studies: Ct Abdomen Pelvis Wo Contrast  12/19/2014   CLINICAL DATA:  Urinary tract infection. Dialysis patient with pyocystitis and funguria. Not clearing with irrigation or debridement. History of end-stage renal disease, diabetes, hypertension, hiatal hernia, anemia, CHF.  EXAM: CT ABDOMEN AND PELVIS WITHOUT CONTRAST  TECHNIQUE: Multidetector CT imaging of the abdomen and pelvis was performed following the standard protocol without IV contrast.  COMPARISON:  08/22/2014  FINDINGS: Coronary artery calcification. Cardiac enlargement. Postoperative changes in the mediastinum. Mild dependent atelectasis in the lung bases. Right lung base nodule measuring 5.3 and 5 mm. Left lung base nodules measuring 4.7 and 8.7 mm. Nodules appear to been present on previous study but or less well-defined due to motion artifact. If the patient is at high risk for bronchogenic carcinoma, follow-up chest CT at 6-12 months is recommended. If the patient is at low risk for bronchogenic carcinoma, follow-up chest CT at 12 months is recommended. This recommendation follows the consensus statement: Guidelines for Management of Small Pulmonary Nodules Detected on CT Scans: A Statement from the [employer] as published in Radiology 2005;237:395-400.  The unenhanced appearance of the liver, spleen, gallbladder, pancreas, adrenal glands, inferior vena cava, and retroperitoneal lymph nodes is unremarkable. Kidneys are atrophic without hydronephrosis. Normal caliber abdominal aorta with mild calcification. Extensive calcification in the celiac axis, SMA, and IMA branches. Stomach is filled with ingested material but no significant gastric distention or wall thickening is appreciated. Small bowel are mostly decompressed. No evidence of bowel distention. Decompression limits  evaluation of bowel wall. Stool-filled colon without abnormal distention. Small amount of free fluid in the upper abdomen, mesentery, and pelvis. This is similar to prior study. No free air in the abdomen.  Pelvis: Prostate gland is not enlarged. Bladder is decompressed with a Foley catheter limiting evaluation. The penile implant has been removed since the previous study. Appendix is not identified. No pelvic mass or lymphadenopathy. Vascular calcifications. No destructive bone lesions appreciated. Mild diffuse edema in the soft tissues.  IMPRESSION: Indeterminate nodules in the lung bases. See above followup recommendations. Small amount of free fluid in the abdomen and pelvis, similar to prior study. This may be due to fluid overload or inflammatory process or anasarca.  Bladder is decompressed with a Foley catheter, limiting evaluation. Extensive vascular calcifications.   Electronically Signed   By: Burman Nieves M.D.   On: 12/19/2014 17:34      Microbiology: Recent Results (from the past 240 hour(s))  Anaerobic culture     Status: None   Collection Time: 12/14/14 11:45 AM  Result Value Ref Range Status   Specimen Description WOUND PENIS  Final   Special Requests NONE  Final   Gram Stain   Final    RARE WBC PRESENT,BOTH PMN AND MONONUCLEAR NO SQUAMOUS EPITHELIAL CELLS SEEN NO ORGANISMS SEEN Performed at [facility]    Culture   Final    NO ANAEROBES ISOLATED Performed at [facility]    Report Status 12/19/2014 FINAL  Final  Wound culture     Status: None   Collection Time: 12/14/14 11:45 AM  Result Value Ref Range Status   Specimen Description WOUND PENIS  Final   Special Requests NONE  Final   Gram Stain   Final    RARE WBC PRESENT,BOTH PMN AND MONONUCLEAR NO SQUAMOUS EPITHELIAL CELLS SEEN NO ORGANISMS SEEN Performed at [facility]    Culture   Final    FEW CANDIDA ALBICANS Performed at [facility]    Report Status 12/16/2014 FINAL   Final  Clostridium Difficile by PCR     Status: None   Collection Time: 12/15/14 11:49 AM  Result Value Ref Range Status   C difficile by pcr NEGATIVE NEGATIVE Final  Urine culture     Status: None   Collection Time: 12/17/14 10:50 AM  Result Value Ref Range Status   Specimen Description URINE, RANDOM  Final   Special Requests NONE  Final   Colony Count   Final    >=100,000 COLONIES/ML Performed at [facility]    Culture YEAST Performed at [facility]   Final   Report Status 12/18/2014 FINAL  Final  Clostridium Difficile by PCR     Status: None   Collection Time: 12/19/14  2:27 PM  Result Value Ref Range Status   C difficile by pcr NEGATIVE NEGATIVE Final     Labs: Results for orders placed or performed during the hospital encounter of 12/10/14 (from the past 48 hour(s))  Glucose, capillary     Status: Abnormal   Collection Time: 12/19/14 11:39 AM  Result Value Ref Range   Glucose-Capillary 147 (H) 70 - 99 mg/dL  Clostridium Difficile by PCR     Status: None   Collection Time: 12/19/14  2:27 PM  Result Value Ref Range   C difficile by pcr NEGATIVE NEGATIVE  Glucose, capillary     Status: None   Collection Time: 12/19/14  4:44 PM  Result Value Ref Range   Glucose-Capillary 74 70 - 99 mg/dL   Comment 1 Notify RN   Glucose, capillary     Status: None   Collection Time: 12/19/14  8:10 PM  Result Value Ref Range   Glucose-Capillary 99 70 - 99 mg/dL  Glucose, capillary     Status: None   Collection Time: 12/20/14 12:12 AM  Result Value Ref Range   Glucose-Capillary 76 70 - 99 mg/dL  Glucose, capillary     Status: Abnormal   Collection Time: 12/20/14  4:10 AM  Result Value Ref Range   Glucose-Capillary 110 (H) 70 - 99 mg/dL  Glucose, capillary     Status: Abnormal   Collection Time: 12/20/14  8:03 AM  Result  Value Ref Range   Glucose-Capillary 100 (H) 70 - 99 mg/dL  CBC     Status: Abnormal   Collection Time: 12/20/14  8:58 AM  Result Value Ref  Range   WBC 4.8 4.0 - 10.5 K/uL   RBC 3.30 (L) 4.22 - 5.81 MIL/uL   Hemoglobin 9.5 (L) 13.0 - 17.0 g/dL   HCT 30.0 (L) 39.0 - 52.0 %   MCV 90.9 78.0 - 100.0 fL   MCH 28.8 26.0 - 34.0 pg   MCHC 31.7 30.0 - 36.0 g/dL   RDW 15.4 11.5 - 15.5 %   Platelets 86 (L) 150 - 400 K/uL    Comment: PLATELET COUNT CONFIRMED BY SMEAR  Renal function panel     Status: Abnormal   Collection Time: 12/20/14  8:58 AM  Result Value Ref Range   Sodium 132 (L) 135 - 145 mmol/L   Potassium 5.9 (H) 3.5 - 5.1 mmol/L   Chloride 97 96 - 112 mmol/L   CO2 23 19 - 32 mmol/L   Glucose, Bld 114 (H) 70 - 99 mg/dL   BUN 47 (H) 6 - 23 mg/dL   Creatinine, Ser 5.49 (H) 0.50 - 1.35 mg/dL   Calcium 7.2 (L) 8.4 - 10.5 mg/dL   Phosphorus 5.3 (H) 2.3 - 4.6 mg/dL   Albumin 2.4 (L) 3.5 - 5.2 g/dL   GFR calc non Af Amer 11 (L) >90 mL/min   GFR calc Af Amer 13 (L) >90 mL/min    Comment: (NOTE) The eGFR has been calculated using the CKD EPI equation. This calculation has not been validated in all clinical situations. eGFR's persistently <90 mL/min signify possible Chronic Kidney Disease.    Anion gap 12 5 - 15  Glucose, capillary     Status: None   Collection Time: 12/20/14  1:41 PM  Result Value Ref Range   Glucose-Capillary 74 70 - 99 mg/dL  Glucose, capillary     Status: Abnormal   Collection Time: 12/20/14  4:23 PM  Result Value Ref Range   Glucose-Capillary 159 (H) 70 - 99 mg/dL  Glucose, capillary     Status: Abnormal   Collection Time: 12/20/14  8:08 PM  Result Value Ref Range   Glucose-Capillary 146 (H) 70 - 99 mg/dL  Glucose, capillary     Status: Abnormal   Collection Time: 12/21/14 12:00 AM  Result Value Ref Range   Glucose-Capillary 103 (H) 70 - 99 mg/dL  Glucose, capillary     Status: Abnormal   Collection Time: 12/21/14  4:06 AM  Result Value Ref Range   Glucose-Capillary 155 (H) 70 - 99 mg/dL  Glucose, capillary     Status: Abnormal   Collection Time: 12/21/14  8:03 AM  Result Value Ref Range    Glucose-Capillary 59 (L) 70 - 99 mg/dL     Hospital course:  44 year old male who  has a past medical history of End stage renal disease on dialysis; Type I diabetes mellitus; Diabetic neuropathy; Hypothyroidism; Hypertension; Hyperlipidemia; H/O hiatal hernia; GERD (gastroesophageal reflux disease); Anxiety; Sebaceous cyst; Pneumonia; Anemia; PAD (peripheral artery disease); Coronary artery disease; Cataract; Valvular disease; CHF (congestive heart failure); Depression; Arthritis; transfusion of packed red blood cells; and Myocardial infarction (2010). 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 At the urology clinic patient was found to have hyperglycemia with blood glucose more than 800 and patient was sent to the emergency room for further evaluation. In the ED patient  was found to have hyperglycemia, anion gap 13. Patient is mentally alert oriented 3, asymptomatic. He denies chest pain no shortness of breath, no nausea vomiting or diarrhea. Denies any fever. White count was normal. Patient's bowels blood pressure was elevated in the ED. [facility] hospitalist called to admit the patient for hyperglycemia and end-stage renal disease. Patient is on hemodialysis on Tuesday Thursday and Saturday, his last semen analysis was in [place]   Assessment/Plan: Hyperosmolar non ketotic hyperglycemia upon admission-resolved  - sugars uncontrolled likely as a result of penile infection - Hemoglobin A1c 15.4 Continue Lantus 30 units daily,  , Novolog 7 units TID with meals for meal coverage, no change    Pyocystitis:Incision and drainage of left and right corpora cavernosaon 2/5 by Dr. Gaynelle Arabian  urology following, Yeast, Candida growing in bladder. Yeast in wound culture. Pt on vigorous bladder irrigations with 1/4% acetic acid  Urine culture growing greater than 100,000 colonies, culture showing yeast patient  started on fluconazole 2/9, continue until 2/18 Initially treated with vancomycin and imipenem which have been discontinued Started fluconazole 2/9 Urology ordered,non-contrast CT to R/o beezor ( fungus ball) which was negative    Penile infection status Post re-drainage of corpora cavernosa with finding of : small amound of pus under the L glans-drained,    - Blood cultures no growth so far since admission.  - s/p I and D 2/5 Continue fluconazole, broad-spectrum antibiotics discontinued  Continue Dressings changes daily. Will need home health for this Urology to make a decision about when to discontinue the Foley catheter  Diarrhea-had 5 bowel movements last night Repeat C. difficile PCR, previously negative Continue florastor for another 2 weeks then discontinue ,    End-stage renal disease on hemodialysis Nephrology following. dialyzed 3 times a week on Tuesday, Thursday, Saturday, in [place].   Hyponatremia- hyperkalemia - management with hemodialysis  Uinary tract infection? Urine culture from 2/2 negative although UA reveals pyuria.  Repeat urine culture 2/8 showing yeast treated with fluconazole   Hypertension Continue home regimen of clonidine, Coreg, Imdur.  Hypothyroidism Continue Synthroid.  Depression Continue Paxil, Celexa, Lyrica.   Code Status: Full Family Communication: No family at bedside. Disposition Plan: Disposition per urology recommendations. Anticipate discharge today    Consultants:  Neurology: Dr. Gaynelle Arabian 12/10/2014  Nephrology: Dr. Mercy Moore 12/11/2014  Procedures:  None          Discharge Exam:    Blood pressure 115/66, pulse 74, temperature 98.8 F (37.1 C), temperature source Oral, resp. rate 17, height $RemoveBe'6\' 3"'awzutNPBt$  (1.905 m), weight 80.2 kg (176 lb 12.9 oz), SpO2 98 %.  General:alert and cooperative GI: ND, NT Male genitalia: not done no bladder distension noted Lungs clear to auscultation bilaterally, no  wheezes Extremities: extremities normal, atraumatic, no cyanosis or edema         Follow-up Information    Follow up with Arnette Norris, MD. Schedule an appointment as soon as possible for a visit in 1 week.   Specialty:  Family Medicine   Contact information:   [address] [address] [phone]       Follow up with Carolan Clines I, MD. Schedule an appointment as soon as possible for a visit in 3 days.   Specialty:  Urology   Contact information:   [place] [address] [phone]       Signed: Reyne Dumas 12/21/2014, 11:11 AM

## 2014-12-21 NOTE — Progress Notes (Signed)
PT Cancellation Note  Patient Details Name: ARLEIGH ODOWD MRN: [id number] DOB: 09/10/1971   Cancelled Treatment:    Reason Eval/Treat Not Completed: PT screened, no needs identified, will sign off.   Conni Slipper 12/21/2014, 3:02 PM   Conni Slipper, PT, DPT Acute Rehabilitation Services Pager: [phone]

## 2014-12-21 NOTE — Care Management Note (Addendum)
CARE MANAGEMENT NOTE 12/21/2014  Patient:  Taylor [name], Taylor [name]   Account Number:  [id number]  Date Initiated:  12/17/2014  Documentation initiated by:  Aniello Christopoulos  Subjective/Objective Assessment:   CM following for progression and d/c planning.     Action/Plan:   Met with pt will arrange any HH services as ordered.  12/21/14 Met with pt plan to d/c to home.   Anticipated DC Date:  12/24/2014   Anticipated DC Plan:  [place]         [facility] Choice  HOME HEALTH   Choice offered to / List presented to:  C-1 Patient        [place] arranged  HH-1 RN  [place].   Status of service:  Completed, signed off Medicare Important Message given?  YES (If response is "NO", the following Medicare IM given date fields will be blank) Date Medicare IM given:  12/17/2014 Medicare IM given by:  Saniyya Gau Date Additional Medicare IM given:  12/21/2014 Additional Medicare IM given by:  [facility]  Discharge Disposition:  [place]  Per UR Regulation:    If discussed at Long Length of Stay Meetings, dates discussed:    Comments:  12/21/2014 Met with pt re d/c needs, pt selected [facility] as he has used that agency previously. [place] notified.  CRoyal RN MPH case manager, [phone]  12/21/14 1430 This CM was informed by [facility] rep, Butch Penny that upon visit with pt in his room to arrange [facility] services the pt declined all [place] services. The pt father was in the room and was aware that the pt was refusing all [place] services.  [facility] notified this CM.   CRoyal RN MPH, case manager, [phone]

## 2014-12-31 ENCOUNTER — Other Ambulatory Visit: Payer: Self-pay | Admitting: Family Medicine

## 2015-01-09 ENCOUNTER — Encounter: Payer: Medicare Other | Admitting: Family Medicine

## 2015-01-24 ENCOUNTER — Emergency Department: Payer: Self-pay | Admitting: Emergency Medicine

## 2015-02-02 ENCOUNTER — Encounter (HOSPITAL_COMMUNITY): Payer: Self-pay | Admitting: Emergency Medicine

## 2015-02-02 ENCOUNTER — Inpatient Hospital Stay (HOSPITAL_COMMUNITY): Payer: Medicare Other

## 2015-02-02 ENCOUNTER — Inpatient Hospital Stay (HOSPITAL_COMMUNITY)
Admission: EM | Admit: 2015-02-02 | Discharge: 2015-02-11 | DRG: 853 | Disposition: A | Discharge status: Skilled Nursing Facility | Payer: Medicare Other | Attending: Internal Medicine | Admitting: Internal Medicine

## 2015-02-02 DIAGNOSIS — E1310 Other specified diabetes mellitus with ketoacidosis without coma: Secondary | ICD-10-CM | POA: Diagnosis not present

## 2015-02-02 DIAGNOSIS — I252 Old myocardial infarction: Secondary | ICD-10-CM

## 2015-02-02 DIAGNOSIS — N186 End stage renal disease: Secondary | ICD-10-CM | POA: Diagnosis present

## 2015-02-02 DIAGNOSIS — E785 Hyperlipidemia, unspecified: Secondary | ICD-10-CM | POA: Diagnosis present

## 2015-02-02 DIAGNOSIS — E039 Hypothyroidism, unspecified: Secondary | ICD-10-CM | POA: Diagnosis present

## 2015-02-02 DIAGNOSIS — R37 Sexual dysfunction, unspecified: Secondary | ICD-10-CM | POA: Diagnosis present

## 2015-02-02 DIAGNOSIS — E0810 Diabetes mellitus due to underlying condition with ketoacidosis without coma: Secondary | ICD-10-CM | POA: Diagnosis not present

## 2015-02-02 DIAGNOSIS — Z992 Dependence on renal dialysis: Secondary | ICD-10-CM | POA: Diagnosis not present

## 2015-02-02 DIAGNOSIS — N529 Male erectile dysfunction, unspecified: Secondary | ICD-10-CM | POA: Diagnosis present

## 2015-02-02 DIAGNOSIS — N4821 Abscess of corpus cavernosum and penis: Secondary | ICD-10-CM | POA: Diagnosis present

## 2015-02-02 DIAGNOSIS — B029 Zoster without complications: Secondary | ICD-10-CM | POA: Diagnosis present

## 2015-02-02 DIAGNOSIS — L02214 Cutaneous abscess of groin: Secondary | ICD-10-CM | POA: Diagnosis present

## 2015-02-02 DIAGNOSIS — I739 Peripheral vascular disease, unspecified: Secondary | ICD-10-CM | POA: Diagnosis present

## 2015-02-02 DIAGNOSIS — A4151 Sepsis due to Escherichia coli [E. coli]: Principal | ICD-10-CM | POA: Diagnosis present

## 2015-02-02 DIAGNOSIS — I10 Essential (primary) hypertension: Secondary | ICD-10-CM | POA: Diagnosis not present

## 2015-02-02 DIAGNOSIS — N492 Inflammatory disorders of scrotum: Secondary | ICD-10-CM | POA: Diagnosis present

## 2015-02-02 DIAGNOSIS — Z7982 Long term (current) use of aspirin: Secondary | ICD-10-CM | POA: Diagnosis not present

## 2015-02-02 DIAGNOSIS — Q5569 Other congenital malformation of penis: Secondary | ICD-10-CM | POA: Diagnosis present

## 2015-02-02 DIAGNOSIS — G9341 Metabolic encephalopathy: Secondary | ICD-10-CM | POA: Diagnosis not present

## 2015-02-02 DIAGNOSIS — R197 Diarrhea, unspecified: Secondary | ICD-10-CM

## 2015-02-02 DIAGNOSIS — E0811 Diabetes mellitus due to underlying condition with ketoacidosis with coma: Secondary | ICD-10-CM | POA: Diagnosis not present

## 2015-02-02 DIAGNOSIS — L0291 Cutaneous abscess, unspecified: Secondary | ICD-10-CM | POA: Diagnosis not present

## 2015-02-02 DIAGNOSIS — F410 Panic disorder [episodic paroxysmal anxiety] without agoraphobia: Secondary | ICD-10-CM | POA: Diagnosis present

## 2015-02-02 DIAGNOSIS — E876 Hypokalemia: Secondary | ICD-10-CM | POA: Diagnosis not present

## 2015-02-02 DIAGNOSIS — I120 Hypertensive chronic kidney disease with stage 5 chronic kidney disease or end stage renal disease: Secondary | ICD-10-CM | POA: Diagnosis present

## 2015-02-02 DIAGNOSIS — F419 Anxiety disorder, unspecified: Secondary | ICD-10-CM | POA: Diagnosis present

## 2015-02-02 DIAGNOSIS — E1040 Type 1 diabetes mellitus with diabetic neuropathy, unspecified: Secondary | ICD-10-CM | POA: Diagnosis present

## 2015-02-02 DIAGNOSIS — F17220 Nicotine dependence, chewing tobacco, uncomplicated: Secondary | ICD-10-CM | POA: Diagnosis present

## 2015-02-02 DIAGNOSIS — E1022 Type 1 diabetes mellitus with diabetic chronic kidney disease: Secondary | ICD-10-CM | POA: Diagnosis present

## 2015-02-02 DIAGNOSIS — E10649 Type 1 diabetes mellitus with hypoglycemia without coma: Secondary | ICD-10-CM | POA: Diagnosis present

## 2015-02-02 DIAGNOSIS — Z794 Long term (current) use of insulin: Secondary | ICD-10-CM | POA: Diagnosis not present

## 2015-02-02 DIAGNOSIS — Z66 Do not resuscitate: Secondary | ICD-10-CM | POA: Diagnosis present

## 2015-02-02 DIAGNOSIS — L03314 Cellulitis of groin: Secondary | ICD-10-CM | POA: Diagnosis present

## 2015-02-02 DIAGNOSIS — R4182 Altered mental status, unspecified: Secondary | ICD-10-CM

## 2015-02-02 DIAGNOSIS — Z951 Presence of aortocoronary bypass graft: Secondary | ICD-10-CM | POA: Diagnosis not present

## 2015-02-02 DIAGNOSIS — A419 Sepsis, unspecified organism: Secondary | ICD-10-CM | POA: Diagnosis present

## 2015-02-02 DIAGNOSIS — R1110 Vomiting, unspecified: Secondary | ICD-10-CM

## 2015-02-02 DIAGNOSIS — I2510 Atherosclerotic heart disease of native coronary artery without angina pectoris: Secondary | ICD-10-CM | POA: Diagnosis present

## 2015-02-02 DIAGNOSIS — G9340 Encephalopathy, unspecified: Secondary | ICD-10-CM | POA: Diagnosis not present

## 2015-02-02 DIAGNOSIS — N4829 Other inflammatory disorders of penis: Secondary | ICD-10-CM | POA: Diagnosis not present

## 2015-02-02 DIAGNOSIS — R410 Disorientation, unspecified: Secondary | ICD-10-CM | POA: Diagnosis not present

## 2015-02-02 DIAGNOSIS — K219 Gastro-esophageal reflux disease without esophagitis: Secondary | ICD-10-CM | POA: Diagnosis present

## 2015-02-02 DIAGNOSIS — E871 Hypo-osmolality and hyponatremia: Secondary | ICD-10-CM | POA: Diagnosis present

## 2015-02-02 DIAGNOSIS — E1029 Type 1 diabetes mellitus with other diabetic kidney complication: Secondary | ICD-10-CM | POA: Diagnosis not present

## 2015-02-02 DIAGNOSIS — F659 Paraphilia, unspecified: Secondary | ICD-10-CM

## 2015-02-02 DIAGNOSIS — E1010 Type 1 diabetes mellitus with ketoacidosis without coma: Secondary | ICD-10-CM | POA: Diagnosis present

## 2015-02-02 DIAGNOSIS — I150 Renovascular hypertension: Secondary | ICD-10-CM | POA: Diagnosis not present

## 2015-02-02 LAB — COMPREHENSIVE METABOLIC PANEL
ALK PHOS: 282 U/L — AB (ref 39–117)
ALK PHOS: 244 U/L — AB (ref 39–117)
ALT: 10 U/L (ref 0–53)
ALT: 8 U/L (ref 0–53)
AST: 17 U/L (ref 0–37)
AST: 24 U/L (ref 0–37)
Albumin: 3.2 g/dL — ABNORMAL LOW (ref 3.5–5.2)
Albumin: 2.8 g/dL — ABNORMAL LOW (ref 3.5–5.2)
Anion gap: 37 — ABNORMAL HIGH (ref 5–15)
Anion gap: 35 — ABNORMAL HIGH (ref 5–15)
BUN: 31 mg/dL — ABNORMAL HIGH (ref 6–23)
BUN: 37 mg/dL — ABNORMAL HIGH (ref 6–23)
CALCIUM: 8.9 mg/dL (ref 8.4–10.5)
CALCIUM: 8.5 mg/dL (ref 8.4–10.5)
CHLORIDE: 85 mmol/L — AB (ref 96–112)
CO2: 8 mmol/L — CL (ref 19–32)
CO2: 8 mmol/L — CL (ref 19–32)
CREATININE: 6.21 mg/dL — AB (ref 0.50–1.35)
Chloride: 88 mmol/L — ABNORMAL LOW (ref 96–112)
Creatinine, Ser: 5.94 mg/dL — ABNORMAL HIGH (ref 0.50–1.35)
GFR calc Af Amer: 12 mL/min — ABNORMAL LOW (ref >90)
GFR calc non Af Amer: 10 mL/min — ABNORMAL LOW (ref >90)
GFR, EST AFRICAN AMERICAN: 11 mL/min — AB (ref >90)
GFR, EST NON AFRICAN AMERICAN: 10 mL/min — AB (ref >90)
GLUCOSE: 590 mg/dL — AB (ref 70–99)
Glucose, Bld: 574 mg/dL (ref 70–99)
POTASSIUM: 4.5 mmol/L (ref 3.5–5.1)
Potassium: 4.7 mmol/L (ref 3.5–5.1)
SODIUM: 130 mmol/L — AB (ref 135–145)
SODIUM: 131 mmol/L — AB (ref 135–145)
Total Bilirubin: 2.3 mg/dL — ABNORMAL HIGH (ref 0.3–1.2)
Total Bilirubin: 3.2 mg/dL — ABNORMAL HIGH (ref 0.3–1.2)
Total Protein: 7.6 g/dL (ref 6.0–8.3)
Total Protein: 7 g/dL (ref 6.0–8.3)

## 2015-02-02 LAB — GRAM STAIN

## 2015-02-02 LAB — BASIC METABOLIC PANEL
Anion gap: 30 — ABNORMAL HIGH (ref 5–15)
Anion gap: 20 — ABNORMAL HIGH (ref 5–15)
Anion gap: 14 (ref 5–15)
BUN: 37 mg/dL — ABNORMAL HIGH (ref 6–23)
BUN: 38 mg/dL — ABNORMAL HIGH (ref 6–23)
BUN: 15 mg/dL (ref 6–23)
CO2: 11 mmol/L — ABNORMAL LOW (ref 19–32)
CO2: 17 mmol/L — AB (ref 19–32)
CO2: 25 mmol/L (ref 19–32)
CREATININE: 6.01 mg/dL — AB (ref 0.50–1.35)
CREATININE: 3.11 mg/dL — AB (ref 0.50–1.35)
Calcium: 8.7 mg/dL (ref 8.4–10.5)
Calcium: 8.4 mg/dL (ref 8.4–10.5)
Calcium: 8.5 mg/dL (ref 8.4–10.5)
Chloride: 90 mmol/L — ABNORMAL LOW (ref 96–112)
Chloride: 95 mmol/L — ABNORMAL LOW (ref 96–112)
Chloride: 97 mmol/L (ref 96–112)
Creatinine, Ser: 5.77 mg/dL — ABNORMAL HIGH (ref 0.50–1.35)
GFR calc Af Amer: 12 mL/min — ABNORMAL LOW (ref >90)
GFR calc Af Amer: 26 mL/min — ABNORMAL LOW (ref >90)
GFR calc non Af Amer: 11 mL/min — ABNORMAL LOW (ref >90)
GFR calc non Af Amer: 22 mL/min — ABNORMAL LOW (ref >90)
GFR, EST AFRICAN AMERICAN: 12 mL/min — AB (ref >90)
GFR, EST NON AFRICAN AMERICAN: 10 mL/min — AB (ref >90)
GLUCOSE: 125 mg/dL — AB (ref 70–99)
Glucose, Bld: 479 mg/dL — ABNORMAL HIGH (ref 70–99)
Glucose, Bld: 358 mg/dL — ABNORMAL HIGH (ref 70–99)
Potassium: 3.4 mmol/L — ABNORMAL LOW (ref 3.5–5.1)
Potassium: 3.3 mmol/L — ABNORMAL LOW (ref 3.5–5.1)
Potassium: 3.6 mmol/L (ref 3.5–5.1)
SODIUM: 131 mmol/L — AB (ref 135–145)
SODIUM: 136 mmol/L (ref 135–145)
Sodium: 132 mmol/L — ABNORMAL LOW (ref 135–145)

## 2015-02-02 LAB — CBC WITH DIFFERENTIAL/PLATELET
BASOS ABS: 0 10*3/uL (ref 0.0–0.1)
Basophils Relative: 0 % (ref 0–1)
Eosinophils Absolute: 0 10*3/uL (ref 0.0–0.7)
Eosinophils Relative: 0 % (ref 0–5)
HCT: 37.1 % — ABNORMAL LOW (ref 39.0–52.0)
Hemoglobin: 11.2 g/dL — ABNORMAL LOW (ref 13.0–17.0)
LYMPHS PCT: 3 % — AB (ref 12–46)
Lymphs Abs: 0.4 10*3/uL — ABNORMAL LOW (ref 0.7–4.0)
MCH: 28.5 pg (ref 26.0–34.0)
MCHC: 30.2 g/dL (ref 30.0–36.0)
MCV: 94.4 fL (ref 78.0–100.0)
Monocytes Absolute: 0.8 10*3/uL (ref 0.1–1.0)
Monocytes Relative: 6 % (ref 3–12)
NEUTROS PCT: 91 % — AB (ref 43–77)
Neutro Abs: 12.8 10*3/uL — ABNORMAL HIGH (ref 1.7–7.7)
Platelets: 238 10*3/uL (ref 150–400)
RBC: 3.93 MIL/uL — ABNORMAL LOW (ref 4.22–5.81)
RDW: 15.6 % — ABNORMAL HIGH (ref 11.5–15.5)
WBC: 14 10*3/uL — ABNORMAL HIGH (ref 4.0–10.5)

## 2015-02-02 LAB — I-STAT ARTERIAL BLOOD GAS, ED
ACID-BASE DEFICIT: 22 mmol/L — AB (ref 0.0–2.0)
BICARBONATE: 5 meq/L — AB (ref 20.0–24.0)
O2 Saturation: 98 %
PCO2 ART: 15.5 mmHg — AB (ref 35.0–45.0)
Patient temperature: 98.6
TCO2: 5 mmol/L (ref 0–100)
pH, Arterial: 7.119 — CL (ref 7.350–7.450)
pO2, Arterial: 126 mmHg — ABNORMAL HIGH (ref 80.0–100.0)

## 2015-02-02 LAB — PROTIME-INR
INR: 1.6 — ABNORMAL HIGH (ref 0.00–1.49)
Prothrombin Time: 19.2 seconds — ABNORMAL HIGH (ref 11.6–15.2)

## 2015-02-02 LAB — CBC
HEMATOCRIT: 34.7 % — AB (ref 39.0–52.0)
Hemoglobin: 10.5 g/dL — ABNORMAL LOW (ref 13.0–17.0)
MCH: 28.5 pg (ref 26.0–34.0)
MCHC: 30.3 g/dL (ref 30.0–36.0)
MCV: 94 fL (ref 78.0–100.0)
Platelets: 210 10*3/uL (ref 150–400)
RBC: 3.69 MIL/uL — ABNORMAL LOW (ref 4.22–5.81)
RDW: 15.7 % — AB (ref 11.5–15.5)
WBC: 14.8 10*3/uL — ABNORMAL HIGH (ref 4.0–10.5)

## 2015-02-02 LAB — URINALYSIS, ROUTINE W REFLEX MICROSCOPIC
GLUCOSE, UA: NEGATIVE mg/dL
KETONES UR: 40 mg/dL — AB
Nitrite: NEGATIVE
Specific Gravity, Urine: 1.021 (ref 1.005–1.030)
Urobilinogen, UA: 0.2 mg/dL (ref 0.0–1.0)
pH: 6 (ref 5.0–8.0)

## 2015-02-02 LAB — GLUCOSE, CAPILLARY
GLUCOSE-CAPILLARY: 69 mg/dL — AB (ref 70–99)
GLUCOSE-CAPILLARY: 168 mg/dL — AB (ref 70–99)
Glucose-Capillary: 134 mg/dL — ABNORMAL HIGH (ref 70–99)
Glucose-Capillary: 66 mg/dL — ABNORMAL LOW (ref 70–99)

## 2015-02-02 LAB — LACTIC ACID, PLASMA
LACTIC ACID, VENOUS: 2.5 mmol/L — AB (ref 0.5–2.0)
Lactic Acid, Venous: 2.2 mmol/L (ref 0.5–2.0)

## 2015-02-02 LAB — CBG MONITORING, ED
GLUCOSE-CAPILLARY: 537 mg/dL — AB (ref 70–99)
GLUCOSE-CAPILLARY: 479 mg/dL — AB (ref 70–99)
GLUCOSE-CAPILLARY: 399 mg/dL — AB (ref 70–99)
GLUCOSE-CAPILLARY: 392 mg/dL — AB (ref 70–99)
GLUCOSE-CAPILLARY: 323 mg/dL — AB (ref 70–99)
Glucose-Capillary: 536 mg/dL — ABNORMAL HIGH (ref 70–99)
Glucose-Capillary: 481 mg/dL — ABNORMAL HIGH (ref 70–99)
Glucose-Capillary: 375 mg/dL — ABNORMAL HIGH (ref 70–99)
Glucose-Capillary: 308 mg/dL — ABNORMAL HIGH (ref 70–99)

## 2015-02-02 LAB — URINE MICROSCOPIC-ADD ON

## 2015-02-02 LAB — MRSA PCR SCREENING: MRSA by PCR: NEGATIVE

## 2015-02-02 MED ORDER — SODIUM CHLORIDE 0.9 % IV SOLN: 100.0000 mL | INTRAVENOUS | Status: DC | PRN

## 2015-02-02 MED ORDER — HEPARIN SODIUM (PORCINE) 1000 UNIT/ML DIALYSIS: 1000.0000 [IU] | INTRAMUSCULAR | Status: DC | PRN

## 2015-02-02 MED ORDER — SODIUM CHLORIDE 0.9 % IV SOLN
INTRAVENOUS | Status: DC
  Administered 2015-02-02: 4.2 [IU]/h via INTRAVENOUS
  Filled 2015-02-02: qty 2.5

## 2015-02-02 MED ORDER — DEXTROSE 5 % IV SOLN
800.0000 mg | Freq: Once | INTRAVENOUS | Status: AC
  Administered 2015-02-02: 800 mg via INTRAVENOUS
  Filled 2015-02-02: qty 16

## 2015-02-02 MED ORDER — FLUCONAZOLE 200 MG PO TABS
200.0000 mg | ORAL_TABLET | Freq: Every day | ORAL | Status: DC
  Administered 2015-02-02 – 2015-02-10 (×9): 200 mg via ORAL
  Filled 2015-02-02 (×10): qty 1

## 2015-02-02 MED ORDER — HALOPERIDOL LACTATE 5 MG/ML IJ SOLN
2.5000 mg | Freq: Once | INTRAMUSCULAR | Status: AC
  Administered 2015-02-02: 2.5 mg via INTRAVENOUS

## 2015-02-02 MED ORDER — VALACYCLOVIR HCL 500 MG PO TABS
1000.0000 mg | ORAL_TABLET | Freq: Two times a day (BID) | ORAL | Status: DC
  Filled 2015-02-02 (×2): qty 2

## 2015-02-02 MED ORDER — SODIUM CHLORIDE 0.9 % IV BOLUS (SEPSIS)
1000.0000 mL | Freq: Once | INTRAVENOUS | Status: AC
  Administered 2015-02-02: 1000 mL via INTRAVENOUS

## 2015-02-02 MED ORDER — CEFEPIME HCL 2 G IJ SOLR: 2.0000 g | INTRAMUSCULAR | Status: DC

## 2015-02-02 MED ORDER — INSULIN ASPART 100 UNIT/ML ~~LOC~~ SOLN
0.0000 [IU] | SUBCUTANEOUS | Status: DC
  Administered 2015-02-02: 3 [IU] via SUBCUTANEOUS
  Administered 2015-02-03 (×2): 8 [IU] via SUBCUTANEOUS
  Administered 2015-02-03: 5 [IU] via SUBCUTANEOUS
  Filled 2015-02-02: qty 0.15

## 2015-02-02 MED ORDER — SODIUM CHLORIDE 0.9 % IV SOLN
INTRAVENOUS | Status: DC
  Administered 2015-02-02: 125 mL/h via INTRAVENOUS

## 2015-02-02 MED ORDER — LIDOCAINE HCL (PF) 1 % IJ SOLN: 5.0000 mL | INTRAMUSCULAR | Status: DC | PRN

## 2015-02-02 MED ORDER — IOHEXOL 300 MG/ML  SOLN
100.0000 mL | Freq: Once | INTRAMUSCULAR | Status: AC | PRN
  Administered 2015-02-02: 100 mL via INTRAVENOUS

## 2015-02-02 MED ORDER — LORAZEPAM 2 MG/ML IJ SOLN
0.5000 mg | Freq: Once | INTRAMUSCULAR | Status: AC
  Administered 2015-02-02: 0.5 mg via INTRAVENOUS

## 2015-02-02 MED ORDER — LIDOCAINE-PRILOCAINE 2.5-2.5 % EX CREA: 1.0000 "application " | TOPICAL_CREAM | CUTANEOUS | Status: DC | PRN

## 2015-02-02 MED ORDER — HEPARIN SODIUM (PORCINE) 5000 UNIT/ML IJ SOLN
5000.0000 [IU] | Freq: Three times a day (TID) | INTRAMUSCULAR | Status: DC
Start: 2015-02-02 — End: 2015-02-11
  Administered 2015-02-02 – 2015-02-11 (×22): 5000 [IU] via SUBCUTANEOUS
  Filled 2015-02-02 (×32): qty 1

## 2015-02-02 MED ORDER — DEXTROSE 50 % IV SOLN
INTRAVENOUS | Status: AC
  Administered 2015-02-02: 25 mL via INTRAVENOUS
  Filled 2015-02-02: qty 50

## 2015-02-02 MED ORDER — HALOPERIDOL LACTATE 5 MG/ML IJ SOLN
2.5000 mg | Freq: Once | INTRAMUSCULAR | Status: AC
  Administered 2015-02-02: 2.5 mg via INTRAVENOUS
  Filled 2015-02-02: qty 1

## 2015-02-02 MED ORDER — DEXTROSE-NACL 5-0.45 % IV SOLN
INTRAVENOUS | Status: DC
Start: 2015-02-02 — End: 2015-02-02
  Administered 2015-02-02: 19:00:00 via INTRAVENOUS

## 2015-02-02 MED ORDER — ALTEPLASE 2 MG IJ SOLR: 2.0000 mg | Freq: Once | INTRAMUSCULAR | Status: DC | PRN

## 2015-02-02 MED ORDER — PENTAFLUOROPROP-TETRAFLUOROETH EX AERO: 1.0000 "application " | INHALATION_SPRAY | CUTANEOUS | Status: DC | PRN

## 2015-02-02 MED ORDER — VALACYCLOVIR HCL 500 MG PO TABS
1000.0000 mg | ORAL_TABLET | Freq: Every day | ORAL | Status: DC
  Filled 2015-02-02 (×3): qty 2

## 2015-02-02 MED ORDER — SODIUM CHLORIDE 0.9 % IV SOLN
INTRAVENOUS | Status: DC
  Administered 2015-02-02: 12:00:00 via INTRAVENOUS

## 2015-02-02 MED ORDER — LORAZEPAM 2 MG/ML IJ SOLN
INTRAMUSCULAR | Status: AC
  Filled 2015-02-02: qty 1

## 2015-02-02 MED ORDER — INSULIN ASPART 100 UNIT/ML ~~LOC~~ SOLN
10.0000 [IU] | Freq: Once | SUBCUTANEOUS | Status: AC
  Administered 2015-02-02: 10 [IU] via INTRAVENOUS
  Filled 2015-02-02: qty 1

## 2015-02-02 MED ORDER — HYDRALAZINE HCL 20 MG/ML IJ SOLN
10.0000 mg | Freq: Four times a day (QID) | INTRAMUSCULAR | Status: DC | PRN
  Administered 2015-02-03: 10 mg via INTRAVENOUS
  Filled 2015-02-02: qty 1

## 2015-02-02 MED ORDER — HALOPERIDOL LACTATE 5 MG/ML IJ SOLN
INTRAMUSCULAR | Status: AC
Start: 2015-02-02 — End: 2015-02-03
  Filled 2015-02-02: qty 1

## 2015-02-02 MED ORDER — INSULIN REGULAR BOLUS VIA INFUSION
0.0000 [IU] | Freq: Three times a day (TID) | INTRAVENOUS | Status: DC
  Filled 2015-02-02: qty 10

## 2015-02-02 MED ORDER — SODIUM CHLORIDE 0.9 % IJ SOLN
3.0000 mL | Freq: Two times a day (BID) | INTRAMUSCULAR | Status: DC
  Administered 2015-02-02 – 2015-02-11 (×16): 3 mL via INTRAVENOUS
  Filled 2015-02-02: qty 3

## 2015-02-02 MED ORDER — CLONIDINE HCL 0.1 MG/24HR TD PTWK
0.1000 mg | MEDICATED_PATCH | TRANSDERMAL | Status: DC
  Filled 2015-02-02: qty 1

## 2015-02-02 MED ORDER — DEXTROSE 50 % IV SOLN
25.0000 mL | INTRAVENOUS | Status: DC | PRN
  Administered 2015-02-02: 25 mL via INTRAVENOUS

## 2015-02-02 MED ORDER — FLUCONAZOLE 100 MG PO TABS: 100.0000 mg | ORAL_TABLET | Freq: Every day | ORAL | Status: DC

## 2015-02-02 MED ORDER — DEXTROSE 5 % IV SOLN
2.0000 g | Freq: Once | INTRAVENOUS | Status: AC
  Administered 2015-02-02: 2 g via INTRAVENOUS
  Filled 2015-02-02: qty 2

## 2015-02-02 MED ORDER — ASPIRIN 81 MG PO CHEW
81.0000 mg | CHEWABLE_TABLET | Freq: Every morning | ORAL | Status: DC
  Administered 2015-02-02 – 2015-02-11 (×9): 81 mg via ORAL
  Filled 2015-02-02 (×9): qty 1

## 2015-02-02 MED ORDER — ONDANSETRON HCL 4 MG/2ML IJ SOLN: 4.0000 mg | Freq: Three times a day (TID) | INTRAMUSCULAR | Status: AC | PRN

## 2015-02-02 MED ORDER — NEPRO/CARBSTEADY PO LIQD: 237.0000 mL | ORAL | Status: DC | PRN

## 2015-02-02 MED ORDER — DEXTROSE-NACL 5-0.45 % IV SOLN: INTRAVENOUS | Status: DC

## 2015-02-02 MED ORDER — DEXTROSE 5 % IV SOLN
INTRAVENOUS | Status: DC
  Administered 2015-02-02 – 2015-02-04 (×2): via INTRAVENOUS

## 2015-02-02 MED ORDER — DEXTROSE 5 % IV SOLN
2.0000 g | INTRAVENOUS | Status: DC
  Administered 2015-02-02: 2 g via INTRAVENOUS
  Filled 2015-02-02 (×3): qty 2

## 2015-02-02 NOTE — Progress Notes (Signed)
ANTIBIOTIC CONSULT NOTE - INITIAL  Pharmacy Consult:  Cefepime Indication:  UTI  Allergies  Allergen Reactions  . Amoxicillin-Pot Clavulanate Nausea And Vomiting  . Hydromorphone Hcl     "body started down"  08/22/14 - pt states no longer has problems with this  . Rifampin Nausea And Vomiting    Patient Measurements: Weight: 185 lb (83.915 kg)  Vital Signs: Temp: 98.4 F (36.9 C) (03/26 0118) Temp Source: Axillary (03/26 0118) BP: 162/70 mmHg (03/26 0800) Pulse Rate: 102 (03/26 0800) Intake/Output from previous day: 03/25 0701 - 03/26 0700 In: 2000 [I.V.:2000] Out: -   Labs:  Recent Labs  02/02/15 0218 02/02/15 0509  WBC 14.0* 14.8*  HGB 11.2* 10.5*  PLT 238 210  CREATININE 6.21* 5.94*   Estimated Creatinine Clearance: 18.4 mL/min (by C-G formula based on Cr of 5.94). No results for input(s): VANCOTROUGH, VANCOPEAK, VANCORANDOM, GENTTROUGH, GENTPEAK, GENTRANDOM, TOBRATROUGH, TOBRAPEAK, TOBRARND, AMIKACINPEAK, AMIKACINTROU, AMIKACIN in the last 72 hours.   Microbiology: No results found for this or any previous visit (from the past 720 hour(s)).  Medical History: Past Medical History  Diagnosis Date  . End stage renal disease on dialysis     LUE fistula  . Type I diabetes mellitus     a. 03/2014 admitted with HNK to [facility].  . Diabetic neuropathy     severe, s/p multiple toe amputation  . Hypothyroidism   . Hypertension   . Hyperlipidemia   . H/O hiatal hernia   . GERD (gastroesophageal reflux disease)   . Anxiety   . Sebaceous cyst     side of neck  . Pneumonia     2010  . Anemia     a. req PRBC's 2011.  [name] PAD (peripheral artery disease)     a. s/p amputation of toes on the right;  b. left LE claudication.  . Coronary artery disease     a. s/p MI;  b. 10/2009 CABG x 3 @ [facility]: LIMA->LAD, VG->OM3, VG->RPDA; c. 11/2010 Cath 3/3 patent grafts;  d 12/2012 Cath: LM 30d, LAD 85p, D1 70, D2 90, LCX 40ost, OM2 100, RCA 90p, 156m, L->LAD ok, VG->OM3 ok, VG->RPDA  30, EF 50%->Med Rx.  . Cataract     right  . Valvular disease     a. 11/2012 Echo: EF 55-60%, mild LVH, mild MR, mild bi-atrial enlargement, mild-mod TR, PASP .  . CHF (congestive heart failure)   . Depression   . Arthritis     rheumatoid arthritis   . Hx of transfusion of packed red blood cells   . Myocardial infarction 2010      Assessment: 40 YOM with history of pyocystitis s/p drainage and infected penile prosthesis s/p explant presented with DKA and nausea and vomiting.  He was recently admitted to the hospital.  Now to start cefepime for UTI, Valtrex for possible shingle and Diflucan for possible yeast infection.  Noted patient has ESRD and is on HD on TTS.   Goal of Therapy:  Resolution of infection   Plan:  - Cefepime 2gm IV x 1 now, then 2gm IV q-HD on TTS - Change fluconazole to 200mg  PO Q24H - Change Valtrex to 1gm PO Q24H, dosed for CrCL 10-29 ml/min since 33% are removed during a 4-hr dialysis - Monitor HD schedule, clinical progress    Juanantonio Stolar D. , PharmD, BCPS Pager:  [phone] 02/02/2015, 8:55 AM

## 2015-02-02 NOTE — ED Notes (Signed)
Notified Dr Kathrynn Running of Gram stain results.

## 2015-02-02 NOTE — ED Notes (Signed)
Md Notified of critical CO2 and CBG

## 2015-02-02 NOTE — ED Provider Notes (Signed)
CSN: [id number]     Arrival date & time 02/02/15  0109 History  This chart was scribed for Loren Racer, MD by Tanda Rockers, ED Scribe. This patient was seen in room B16C/B16C and the patient's care was started at 2:01 AM.     Chief Complaint  Patient presents with  . Hyperglycemia  . Panic Attack   The history is provided by the patient. No language interpreter was used.     HPI Comments: Taylor [name] is a 44 y.o. male brought in by ambulance, with PMHx ESRD on dialysis, DM, HTN, HLD, GERD, Anxiety who presents to the Emergency Department complaining of nausea and vomiting that began 3 days ago. Pt states he has vomited 2-3 times per day. He also complains of diarrhea which he reports is watery. Pt denies fever, chills, chest pain, abdominal pain, vision changes, eye pain, or any other symptoms. Family reports that pt has been pruritic rash on the top of his head for the past day. Family also mentions increased swelling to the groin. Was seen by urology yesterday and had Foley catheter placed. Pt mentions that en route he had an anxiety attack. Pt had Xanax administered by EMS. States he is feeling much more calm currently. Elevated blood sugar in route. Denies missing any dosing of his insulin.     Past Medical History  Diagnosis Date  . End stage renal disease on dialysis     LUE fistula  . Type I diabetes mellitus     a. 03/2014 admitted with HNK to [facility].  . Diabetic neuropathy     severe, s/p multiple toe amputation  . Hypothyroidism   . Hypertension   . Hyperlipidemia   . H/O hiatal hernia   . GERD (gastroesophageal reflux disease)   . Anxiety   . Sebaceous cyst     side of neck  . Pneumonia     2010  . Anemia     a. req PRBC's 2011.  [name] PAD (peripheral artery disease)     a. s/p amputation of toes on the right;  b. left LE claudication.  . Coronary artery disease     a. s/p MI;  b. 10/2009 CABG x 3 @ [facility]: LIMA->LAD, VG->OM3, VG->RPDA; c. 11/2010 Cath 3/3  patent grafts;  d 12/2012 Cath: LM 30d, LAD 85p, D1 70, D2 90, LCX 40ost, OM2 100, RCA 90p, 15m, L->LAD ok, VG->OM3 ok, VG->RPDA 30, EF 50%->Med Rx.  . Cataract     right  . Valvular disease     a. 11/2012 Echo: EF 55-60%, mild LVH, mild MR, mild bi-atrial enlargement, mild-mod TR, PASP .  . CHF (congestive heart failure)   . Depression   . Arthritis     rheumatoid arthritis   . Hx of transfusion of packed red blood cells   . Myocardial infarction 2010   Past Surgical History  Procedure Laterality Date  . Dialysis fistula creation      left upper arm fistula  . Amputation      TOES ON BOTH FEET  . Abscess drainage  Behind right ear/occipital scalp  . Skin graft    . Eye surgery  1999  . Coronary artery bypass graft  10/2009    [facility] (Dr. Katrinka Blazing)  . Foot amputation    . Cardiac catheterization    . Cardiac catheterization  2/14    [facility]: severe 3 vessel CAD with patent grafts, RHC: moderately elevated PCW and pulmonary hypertension  . Penile prosthesis implant  N/A 07/24/2014    Procedure: SALINE PENILE INJECTION WITH DISSECTION OF CORPORA ,  IMPLANTATION OF COLOPLAST PENILE PROTHESIS INFLATABLE;  Surgeon: [name], MD;  Location: [facility];  Service: Urology;  Laterality: N/A;  . Removal of penile prosthesis N/A 08/22/2014    Procedure: EXPLANT OF INFECTED  PENILE PROSTHESIS;  Surgeon: [name], MD;  Location: [facility];  Service: Urology;  Laterality: N/A;  . Irrigation and debridement abscess Bilateral 12/14/2014    Procedure: Bilateral Corporal Irrigation with Cultures and Drainage, Penrose Drain Insertion;  Surgeon: [name], MD;  Location: [facility] OR;  Service: Urology;  Laterality: Bilateral;   Family History  Problem Relation Age of Onset  . Heart disease Other   . Hypertension Other   . Hypertension Mother   . Heart disease Mother   . Diabetes Mother   . Birth defects Paternal Uncle     unaware  . Birth defects Paternal Grandmother     breast    History  Substance Use Topics  . Smoking status: Never Smoker   . Smokeless tobacco: Former Neurosurgeon    Types: Chew    Quit date: 08/23/1995  . Alcohol Use: 0.0 oz/week     Comment: occasional beer    Review of Systems  Constitutional: Negative for fever and chills.  Eyes: Negative for pain and visual disturbance.  Respiratory: Negative for cough and shortness of breath.   Cardiovascular: Negative for chest pain and leg swelling.  Gastrointestinal: Positive for nausea, vomiting and diarrhea. Negative for abdominal pain, constipation, blood in stool, abdominal distention and anal bleeding.  Genitourinary: Positive for penile swelling and scrotal swelling.  Musculoskeletal: Negative for back pain, neck pain and neck stiffness.  Skin: Positive for rash (To top of head. ). Negative for wound.  Neurological: Negative for dizziness, weakness, light-headedness, numbness and headaches.  Psychiatric/Behavioral: The patient is nervous/anxious.   All other systems reviewed and are negative.     Allergies  Amoxicillin-pot clavulanate; Hydromorphone hcl; and Rifampin  Home Medications   Prior to Admission medications   Medication Sig Start Date End Date Taking? Authorizing Provider  acetaminophen (TYLENOL) 500 MG tablet Take 1,000 mg by mouth every 6 (six) hours as needed (pain).    Historical Provider, MD  amitriptyline (ELAVIL) 25 MG tablet Take 1 tablet (25 mg total) by mouth at bedtime. 08/21/14   Dianne Dun, MD  aspirin 81 MG tablet Take 81 mg by mouth every morning.     Historical Provider, MD  calcium carbonate (TUMS - DOSED IN MG ELEMENTAL CALCIUM) 500 MG chewable tablet Chew 1-2 tablets (200-400 mg of elemental calcium total) by mouth 2 (two) times daily as needed for indigestion or heartburn. 12/21/14   Richarda Overlie, MD  carvedilol (COREG) 25 MG tablet Take 25 mg by mouth 2 (two) times daily with a meal.    Historical Provider, MD  cinacalcet (SENSIPAR) 60 MG tablet Take 60 mg by  mouth at bedtime. With biggest meal 02/16/11   Dianne Dun, MD  citalopram (CELEXA) 20 MG tablet TAKE 1 BY MOUTH DAILY Patient taking differently: TAKE 1 BY MOUTH NIGHTLY AT BEDTIME. 09/27/14   Dianne Dun, MD  cloNIDine (CATAPRES) 0.1 MG tablet Take 0.1 mg by mouth 2 (two) times daily.    Historical Provider, MD  furosemide (LASIX) 80 MG tablet Take 80 mg by mouth 2 (two) times daily.    Historical Provider, MD  HYDROcodone-acetaminophen (NORCO/VICODIN) 5-325 MG per tablet Take 1 tablet by  mouth every 4 (four) hours as needed for moderate pain. 12/21/14   Richarda Overlie, MD  insulin aspart (NOVOLOG) 100 UNIT/ML injection Inject 7 Units into the skin 3 (three) times daily with meals. 12/21/14   Richarda Overlie, MD  insulin glargine (LANTUS) 100 UNIT/ML injection Inject 0.3 mLs (30 Units total) into the skin daily. 12/21/14   Richarda Overlie, MD  isosorbide mononitrate (IMDUR) 30 MG 24 hr tablet Take 30 mg by mouth 2 (two) times daily.    Historical Provider, MD  levothyroxine (SYNTHROID, LEVOTHROID) 75 MCG tablet Take 1 tablet (75 mcg total) by mouth daily before breakfast. 07/26/13   Dianne Dun, MD  LYRICA 150 MG capsule TAKE 1 CAPSULE BY MOUTH EVERY MORNING AND 2 CAPSULE EVERY EVENING 10/01/14   Dianne Dun, MD  Nutritional Supplements (FEEDING SUPPLEMENT, NEPRO CARB STEADY,) LIQD Take 237 mLs by mouth as needed (missed meal during dialysis.). 08/24/14   Richarda Overlie, MD  nystatin-triamcinolone (MYCOLOG II) cream Apply 1 application topically every 8 (eight) hours as needed (rash).     Historical Provider, MD  Omega-3 Fatty Acids (OMEGA 3 PO) Take 1,000 mg by mouth daily.    Historical Provider, MD  omeprazole (PRILOSEC) 40 MG capsule Take 40 mg by mouth every morning.     Historical Provider, MD  PARoxetine (PAXIL) 20 MG tablet Take 20 mg by mouth every morning.     Historical Provider, MD  permethrin (ELIMITE) 5 % cream APPLY TOPICALLY ONCE AS DIRECTED 12/31/14   Dianne Dun, MD  rosuvastatin (CRESTOR)  20 MG tablet Take 1 tablet (20 mg total) by mouth at bedtime. 12/13/12   Dianne Dun, MD  sevelamer carbonate (RENVELA) 800 MG tablet Take 800 mg by mouth 3 (three) times daily with meals.    Historical Provider, MD   Triage Vitals: BP 155/68 mmHg  Pulse 102  Temp(Src) 98.4 F (36.9 C) (Axillary)  Resp 25  SpO2 100%   Physical Exam  Constitutional: He is oriented to person, place, and time. He appears well-developed and well-nourished. No distress.  HENT:  Head: Normocephalic and atraumatic.  Mouth/Throat: Oropharynx is clear and moist.  Dry mucous membranes  Eyes: EOM are normal. Pupils are equal, round, and reactive to light.  No evidence of rash near the patient's eyes. Clear sclera.  Neck: Normal range of motion. Neck supple.  Cardiovascular: Normal rate and regular rhythm.  Exam reveals no gallop and no friction rub.   No murmur heard. Pulmonary/Chest: Effort normal and breath sounds normal. No respiratory distress. He has no wheezes. He has no rales. He exhibits no tenderness.  Abdominal: Soft. Bowel sounds are normal. He exhibits no distension and no mass. There is no tenderness. There is no rebound and no guarding.  Genitourinary:  Diffuse penile and scrotal swelling. Full catheter in place. No obvious erythema, warmth, or induration  Musculoskeletal: Normal range of motion. He exhibits no edema or tenderness.  Partial right foot amputation, left great toe and second toe amputations. Chronic appearing pretibial ulcerations.  Neurological: He is alert and oriented to person, place, and time.  Moves all extremities without deficit.  Skin: Skin is warm and dry. Rash noted. No erythema.  Patient with scaly rash to the scalp on the right side in the parietal region. Does not cross midline.  Psychiatric: He has a normal mood and affect. His behavior is normal.  Nursing note and vitals reviewed.      ED Course  Angiocath insertion Date/Time: 02/02/2015 4:00  AM Performed by:  Loren Racer Authorized by: Loren Racer Consent: Verbal consent obtained. Patient tolerance: Patient tolerated the procedure well with no immediate complications Comments: Area cleansed with chlorhexidine scrub. Right external jugular placed with venous return. Flushed well. Patient tolerated   (including critical care time)  DIAGNOSTIC STUDIES: Oxygen Saturation is 100% on RA, normal by my interpretation.    COORDINATION OF CARE: 2:05 AM-Discussed treatment plan with pt at bedside and pt agreed to plan.   Labs Review Labs Reviewed  CBC WITH DIFFERENTIAL/PLATELET - Abnormal; Notable for the following:    WBC 14.0 (*)    RBC 3.93 (*)    Hemoglobin 11.2 (*)    HCT 37.1 (*)    RDW 15.6 (*)    Neutrophils Relative % 91 (*)    Neutro Abs 12.8 (*)    Lymphocytes Relative 3 (*)    Lymphs Abs 0.4 (*)    All other components within normal limits  COMPREHENSIVE METABOLIC PANEL - Abnormal; Notable for the following:    Sodium 130 (*)    Chloride 85 (*)    CO2 8 (*)    Glucose, Bld 590 (*)    BUN 31 (*)    Creatinine, Ser 6.21 (*)    Albumin 3.2 (*)    Alkaline Phosphatase 282 (*)    Total Bilirubin 2.3 (*)    GFR calc non Af Amer 10 (*)    GFR calc Af Amer 11 (*)    Anion gap 37 (*)    All other components within normal limits  CBG MONITORING, ED - Abnormal; Notable for the following:    Glucose-Capillary 536 (*)    All other components within normal limits  I-STAT ARTERIAL BLOOD GAS, ED - Abnormal; Notable for the following:    pH, Arterial 7.119 (*)    pCO2 arterial 15.5 (*)    pO2, Arterial 126.0 (*)    Bicarbonate 5.0 (*)    Acid-base deficit 22.0 (*)    All other components within normal limits  CBG MONITORING, ED - Abnormal; Notable for the following:    Glucose-Capillary 537 (*)    All other components within normal limits  URINALYSIS, ROUTINE W REFLEX MICROSCOPIC  BLOOD GAS, ARTERIAL  CBG MONITORING, ED    Imaging Review No results found.   EKG  Interpretation None     CRITICAL CARE Performed by: Ranae Palms, Almer Littleton Total critical care time: 30 min Critical care time was exclusive of separately billable procedures and treating other patients. Critical care was necessary to treat or prevent imminent or life-threatening deterioration. Critical care was time spent personally by me on the following activities: development of treatment plan with patient and/or surrogate as well as nursing, discussions with consultants, evaluation of patient's response to treatment, examination of patient, obtaining history from patient or surrogate, ordering and performing treatments and interventions, ordering and review of laboratory studies, ordering and review of radiographic studies, pulse oximetry and re-evaluation of patient's condition.  MDM   Final diagnoses:  Diabetic ketoacidosis without coma associated with type 1 diabetes mellitus  Shingles rash  Vomiting and diarrhea    I personally performed the services described in this documentation, which was scribed in my presence. The recorded information has been reviewed and is accurate.   Discussed case with Dr. Isabel Caprice, who reviewed the patient's notes from visit 2 days ago. He was seen by Dr. Jill Poling at that time. Presenting with urinary hesitancy. No evidence of infection at that point. Foley catheter was placed.  No antibiotics initiated.   DKA protocol initiated. Right-sided external jugular IV placed. Patient appears to have scalp shingles which may have triggered DKA. No definite penile infection is visualized at this time. We'll discuss admission with [facility].  Loren Racer, MD 02/02/15 [phone]

## 2015-02-02 NOTE — ED Notes (Signed)
Per EMS, pt had anxiety attack PTA. Had 1 Xanax prior to EMS arrival. CBG was 481 en route. Pt removed 2 separate IV's placed by EMS, received approx 150 mL of fluid.

## 2015-02-02 NOTE — ED Notes (Signed)
CBG = 375  RN Marylene Land informed of result.

## 2015-02-02 NOTE — ED Notes (Signed)
CBG = 308  RN Marylene Land informed of result.

## 2015-02-02 NOTE — ED Notes (Signed)
Main lab called in critical results for CO2 of 8 and glucose of 574.

## 2015-02-02 NOTE — Progress Notes (Signed)
CRITICAL VALUE ALERT  Critical value received:  Lactic acid 2.5  Date of notification: 02/02/15  Time of notification:  13:45 Critical value read back:yes   Nurse who received alert:  Truman Hayward RN   MD notified (1st page):  Osvaldo Shipper  Time of first page:  14:CRITICAL VALUE ALERT  Critical value received:  lactic acid = 2.5  Date of notification:  02/02/15  Time of notification:  13:45  Critical value read back:yes  Nurse who received alert:Blanca Johny Drilling, RN  MD notified (1st page):  Osvaldo Shipper  Time of first page:  13:45  MD notified (2nd page):same as above  Time of second page:14:00  Responding MD:  Osvaldo Shipper  Time MD responded: 14:10  MD notified (2nd page)Krishnan, Gokul  Time of second page:14:10  Responding MD: Osvaldo Shipper  Time MD responded: 14:15

## 2015-02-02 NOTE — ED Notes (Signed)
CBG = 392  RN Marylene Land informed of results.

## 2015-02-02 NOTE — Progress Notes (Signed)
Terminated treatment. Patient was peaceful the whole treatment but aproximatelly 3-5 minutes before terminating treatment patient became very restless, temporary restraints applied  And reported to 2H17 nurse regarding the restraints. . Removed needles with two nurses holding him down, and one nurse pulling off the needles. Patient had a big bowel movement previous to him become restless. Cleaned patient and linen changed. T  Transferred patient and report given to primary nurse.

## 2015-02-02 NOTE — Progress Notes (Signed)
I was called by Dr. Arlean Hopping at about 10:PM, asking me to have changed the IVF D5-1/2 NS at 75 cc/h to D5W at 50 cc/h because he thinks that patient has fluid overload.   Lorretta Harp  MD.   .

## 2015-02-02 NOTE — Consult Note (Signed)
Renal Service Consult Note [facility]  OSHEN WLODARCZYK 02/02/2015 Roney Jaffe D Requesting Physician:  Dr [place] Pink  Reason for Consult:  ESRD pt with AMS, cloudy urine, indwelling foley HPI: The patient is a 44 y.o. year-old with hx of DM 1, CABG 2010, ESRD on HD 5 yrs, presented to ED this am with n/v and confusion.  Vomiting for about 3 days, anorexia. No sick contacts.  Hist of penile implant done in Sept '15 complicated by infection requiring removal one month later Oct '15.  In Feb 2016 was here with indwelling foley and Candida pyocystis rx with po / IV diflucan and bladder irrigation w acetic acid.  Urology is following.    Brother provides history.  Does HD in [place], lives with his mom and dad, does not miss treatments.  Does not provide any history today due to somnolence.    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 Also dc'd w foley, ESRD on HD, DKA resolved, HTN  2/1 - 12/21/14 > in urol clinic w BS > 800 , sent to ED and admitted > dx was HONK, also pyocystis d/t Candida. Rec'd abxd/ fluconazole and urology did I&D of L/ R corpus cavernosa on 2/5.  Bladder irrigation w acetic acid.  IV then po diflucan.    ROS  n/a  Past Medical History  Past Medical History  Diagnosis Date  . End stage renal disease on dialysis     LUE fistula  . Type I diabetes mellitus     a. 03/2014 admitted with HNK to [facility].  . Diabetic neuropathy     severe, s/p multiple toe amputation  . Hypothyroidism   . Hypertension   . Hyperlipidemia   . H/O hiatal hernia   . GERD (gastroesophageal reflux disease)   . Anxiety   . Sebaceous cyst     side of neck  .  Pneumonia     2010  . Anemia     a. req PRBC's 2011.  [name] PAD (peripheral artery disease)     a. s/p amputation of toes on the right;  b. left LE claudication.  . Coronary artery disease     a. s/p MI;  b. 10/2009 CABG x 3 @ [facility]: LIMA->LAD, VG->OM3, VG->RPDA; c. 11/2010 Cath 3/3 patent grafts;  d 12/2012 Cath: LM 30d, LAD 85p, D1 70, D2 90, LCX 40ost, OM2 100, RCA 90p, 150m, L->LAD ok, VG->OM3 ok, VG->RPDA 30, EF 50%->Med Rx.  . Cataract     right  . Valvular disease     a. 11/2012 Echo: EF 55-60%, mild LVH, mild MR, mild bi-atrial enlargement, mild-mod TR, PASP 35mmHg.  . CHF (congestive heart failure)   . Depression   . Arthritis     rheumatoid arthritis   . Hx of transfusion of packed red blood cells   . Myocardial infarction 2010   Past Surgical History  Past Surgical History  Procedure Laterality Date  . Dialysis fistula creation      left upper arm fistula  . Amputation      TOES ON BOTH FEET  . Abscess drainage  Behind right ear/occipital scalp  . Skin graft    .  Eye surgery  1999  . Coronary artery bypass graft  10/2009    [facility] (Dr. Tamala Julian)  . Foot amputation    . Cardiac catheterization    . Cardiac catheterization  2/14    [facility]: severe 3 vessel CAD with patent grafts, RHC: moderately elevated PCW and pulmonary hypertension  . Penile prosthesis implant N/A 07/24/2014    Procedure: SALINE PENILE INJECTION WITH DISSECTION OF CORPORA ,  IMPLANTATION OF COLOPLAST PENILE PROTHESIS INFLATABLE;  Surgeon: Ailene Rud, MD;  Location: [facility];  Service: Urology;  Laterality: N/A;  . Removal of penile prosthesis N/A 08/22/2014    Procedure: EXPLANT OF INFECTED  PENILE PROSTHESIS;  Surgeon: Jorja Loa, MD;  Location: [facility];  Service: Urology;  Laterality: N/A;  . Irrigation and debridement abscess Bilateral 12/14/2014    Procedure: Bilateral Corporal Irrigation with Cultures and Drainage, Penrose Drain Insertion;  Surgeon: Ailene Rud, MD;  Location: [place];   Service: Urology;  Laterality: Bilateral;   Family History  Family History  Problem Relation Age of Onset  . Heart disease Other   . Hypertension Other   . Hypertension Mother   . Heart disease Mother   . Diabetes Mother   . Birth defects Paternal Uncle     unaware  . Birth defects Paternal Grandmother     breast   Social History  reports that he has never smoked. He quit smokeless tobacco use about 19 years ago. His smokeless tobacco use included Chew. He reports that he drinks alcohol. He reports that he does not use illicit drugs. Allergies  Allergies  Allergen Reactions  . Amoxicillin-Pot Clavulanate Nausea And Vomiting  . Hydromorphone Hcl     "body started down"  08/22/14 - pt states no longer has problems with this  . Rifampin Nausea And Vomiting   Home medications Prior to Admission medications   Medication Sig Start Date End Date Taking? Authorizing Provider  amitriptyline (ELAVIL) 25 MG tablet Take 1 tablet (25 mg total) by mouth at bedtime. 08/21/14  Yes Lucille Passy, MD  aspirin 81 MG tablet Take 81 mg by mouth every morning.    Yes Historical Provider, MD  calcium carbonate (TUMS - DOSED IN MG ELEMENTAL CALCIUM) 500 MG chewable tablet Chew 1-2 tablets (200-400 mg of elemental calcium total) by mouth 2 (two) times daily as needed for indigestion or heartburn. 12/21/14  Yes Reyne Dumas, MD  carvedilol (COREG) 25 MG tablet Take 25 mg by mouth 2 (two) times daily with a meal.   Yes Historical Provider, MD  cinacalcet (SENSIPAR) 60 MG tablet Take 60 mg by mouth at bedtime. With biggest meal 02/16/11  Yes Lucille Passy, MD  citalopram (CELEXA) 20 MG tablet TAKE 1 BY MOUTH DAILY Patient taking differently: TAKE 1 BY MOUTH NIGHTLY AT BEDTIME. 09/27/14  Yes Lucille Passy, MD  cloNIDine (CATAPRES) 0.1 MG tablet Take 0.1 mg by mouth 2 (two) times daily.   Yes Historical Provider, MD  furosemide (LASIX) 80 MG tablet Take 80 mg by mouth 2 (two) times daily.   Yes Historical Provider,  MD  HYDROcodone-acetaminophen (NORCO/VICODIN) 5-325 MG per tablet Take 1 tablet by mouth every 4 (four) hours as needed for moderate pain. 12/21/14  Yes Reyne Dumas, MD  insulin aspart (NOVOLOG) 100 UNIT/ML injection Inject 7 Units into the skin 3 (three) times daily with meals. Patient taking differently: Inject 7-17 Units into the skin 3 (three) times daily with meals. Sliding scale 12/21/14  Yes Nayana  Abrol, MD  insulin glargine (LANTUS) 100 UNIT/ML injection Inject 0.3 mLs (30 Units total) into the skin daily. 12/21/14  Yes Reyne Dumas, MD  isosorbide mononitrate (IMDUR) 30 MG 24 hr tablet Take 30 mg by mouth 2 (two) times daily.   Yes Historical Provider, MD  levothyroxine (SYNTHROID, LEVOTHROID) 75 MCG tablet Take 1 tablet (75 mcg total) by mouth daily before breakfast. 07/26/13  Yes Lucille Passy, MD  LYRICA 150 MG capsule TAKE 1 CAPSULE BY MOUTH EVERY MORNING AND 2 CAPSULE EVERY EVENING 10/01/14  Yes Lucille Passy, MD  Nutritional Supplements (FEEDING SUPPLEMENT, NEPRO CARB STEADY,) LIQD Take 237 mLs by mouth as needed (missed meal during dialysis.). 08/24/14  Yes Reyne Dumas, MD  nystatin-triamcinolone (MYCOLOG II) cream Apply 1 application topically every 8 (eight) hours as needed (rash).    Yes Historical Provider, MD  Omega-3 Fatty Acids (OMEGA 3 PO) Take 1,000 mg by mouth daily.   Yes Historical Provider, MD  omeprazole (PRILOSEC) 40 MG capsule Take 40 mg by mouth every morning.    Yes Historical Provider, MD  PARoxetine (PAXIL) 20 MG tablet Take 20 mg by mouth every morning.    Yes Historical Provider, MD  rosuvastatin (CRESTOR) 20 MG tablet Take 1 tablet (20 mg total) by mouth at bedtime. 12/13/12  Yes Lucille Passy, MD  sevelamer carbonate (RENVELA) 800 MG tablet Take 800 mg by mouth 3 (three) times daily with meals.   Yes Historical Provider, MD  acetaminophen (TYLENOL) 500 MG tablet Take 1,000 mg by mouth every 6 (six) hours as needed (pain).    Historical Provider, MD  permethrin  (ELIMITE) 5 % cream APPLY TOPICALLY ONCE AS DIRECTED Patient not taking: Reported on 02/02/2015 12/31/14   Lucille Passy, MD   Liver Function Tests  Recent Labs Lab 02/02/15 0218 02/02/15 0509  AST 17 24  ALT 10 8  ALKPHOS 282* 244*  BILITOT 2.3* 3.2*  PROT 7.6 7.0  ALBUMIN 3.2* 2.8*   No results for input(s): LIPASE, AMYLASE in the last 168 hours. CBC  Recent Labs Lab 02/02/15 0218 02/02/15 0509  WBC 14.0* 14.8*  NEUTROABS 12.8*  --   HGB 11.2* 10.5*  HCT 37.1* 34.7*  MCV 94.4 94.0  PLT 238 161   Basic Metabolic Panel  Recent Labs Lab 02/02/15 0218 02/02/15 0509 02/02/15 0813  NA 130* 131* 131*  K 4.5 4.7 3.4*  CL 85* 88* 90*  CO2 8* 8* 11*  GLUCOSE 590* 574* 479*  BUN 31* 37* 37*  CREATININE 6.21* 5.94* 6.01*  CALCIUM 8.9 8.5 8.7    Filed Vitals:   02/02/15 0830 02/02/15 0900 02/02/15 0930 02/02/15 0938  BP: 169/78 158/68 163/60   Pulse: 104 102 102   Temp:    97 F (36.1 C)  TempSrc:    Rectal  Resp: $Remo'24 23 18   'QCxeZ$ Weight:      SpO2: 100% 100% 100%    Exam Lethargic, minimal verbal response, will follow simple commands though, no distress, calm lying flat No rash, cyanosis or gangrene Sclera anicteric, throat clear No jvd Chest clear bilat RRR no MRG, sternal scar Abd soft, NTND, +BS, no ascites GU penis is firm and slightly swollen, no erythema, scrotum no erythema Foley w leg bag draining murky brownish urine 1+ bilat LE edema, R TMA and toe amps on L Neuro is nf, somnolent  UA - 1.021, 6.0, >300 prot, large LE/Hb, neg nitrite, many yeast, TNTC WBC, 7-10 rbc, many bact  HD:  TTS [facility] 3h 53min   80kg   Heparin none  L arm AVF   Assessment: 1. AMS - prob d/t infection / DKA 2. [facility] / purulent urine / indwelling foley - prob has recurrent UTI/ pyocystis.  Urology has been consulted.  3. DKA - on IV insulin 4. HTN / vol - BP 's up and volume up / over dry wt, stop IVF's. Holding home clonidine/ coreg, Catapres patch on 5. ESRD on  HD TTS x 5 yrs- HD today  6. Met acidosis - d/t DKA/ renal failure/ missed last HD 7. CAD hx CABG 8. DM type 1   Plan- HD today upstairs , UF 1-2 kg, stop IVF"s. Use only D5W if he needs dextrose for DKA management. Have ordered urine culture.   Kelly Splinter MD (pgr) [phone]    [phone] 02/02/2015, 9:49 AM

## 2015-02-02 NOTE — H&P (Signed)
. [facility] History and Physical  Taylor [name] IWP:[id number] DOB: [birth year] DOA: 02/02/2015  Referring physician: ED PCP: [name], MD  Specialists:  None  Chief Complaint: DKA  HPI:  44 y/o ?, History ESRD since 2010, ty 1 DM + DM neuropathy, Prior ETOH  Recent admission 12/10/14--->12/21/2014 hyperglycemia, pyocystitis status post drainage-history of infected inflatable penile prosthesis S/P explant 08/22/2014, hypothyroidism, known three-vessel coronary disease 2010-status post CABG 2010 [facility] + left + right sided cath 12/2012 = patent grafts however found to have moderate pulmonary hypertension EF was normal -status post right Lis-Franc Amputation, prior posterior neck abscess. Patient presented to the emergency room 3/26 with 3 day history of nausea vomiting. Has been throwing up about 3-4 times per day. Not been able to eat since the past 5 days. No ill contacts. Recently seen by urology PA on 3/23 and it was thought that his previously infected area of penile prosthesis was reinfected and a Foley catheter was placed again-patient allegedly was not started back on any antibiotics. This issue has been going on for past 2-3 weeks as a recurrence of his prior issues last hospital stay. He has no cough no cold no chest pain no shortness of breath and sputum no blurred vision no double vision He does have a new rash in a dermatomal distribution over his forehead and had without any visual involvement  Emergency room workup was significant for pH 7.11, PaO2 126, PCO2 15 Sodium 13, chloride 85, CO2 8, urine/creatinine 31/6.2, anion gap 37, alkaline phosphatase 282, total bilirubin 2.3 WC 14.0, hemoglobin 11.2    Review of Systems: A 14 point ROS was performed and is negative except as noted in the HPI   Past Medical History  Diagnosis Date  . End stage renal disease on dialysis     LUE fistula  . Type I diabetes mellitus     a. 03/2014 admitted with HNK to  [facility].  . Diabetic neuropathy     severe, s/p multiple toe amputation  . Hypothyroidism   . Hypertension   . Hyperlipidemia   . H/O hiatal hernia   . GERD (gastroesophageal reflux disease)   . Anxiety   . Sebaceous cyst     side of neck  . Pneumonia     2010  . Anemia     a. req PRBC's 2011.  [name] PAD (peripheral artery disease)     a. s/p amputation of toes on the right;  b. left LE claudication.  . Coronary artery disease     a. s/p MI;  b. 10/2009 CABG x 3 @ [facility]: LIMA->LAD, VG->OM3, VG->RPDA; c. 11/2010 Cath 3/3 patent grafts;  d 12/2012 Cath: LM 30d, LAD 85p, D1 70, D2 90, LCX 40ost, OM2 100, RCA 90p, 170m, L->LAD ok, VG->OM3 ok, VG->RPDA 30, EF 50%->Med Rx.  . Cataract     right  . Valvular disease     a. 11/2012 Echo: EF 55-60%, mild LVH, mild MR, mild bi-atrial enlargement, mild-mod TR, PASP .  . CHF (congestive heart failure)   . Depression   . Arthritis     rheumatoid arthritis   . Hx of transfusion of packed red blood cells   . Myocardial infarction 2010   Past Surgical History  Procedure Laterality Date  . Dialysis fistula creation      left upper arm fistula  . Amputation      TOES ON BOTH FEET  . Abscess drainage  Behind right ear/occipital scalp  .  Skin graft    . Eye surgery  1999  . Coronary artery bypass graft  10/2009    [facility] (Dr. Katrinka Blazing)  . Foot amputation    . Cardiac catheterization    . Cardiac catheterization  2/14    [facility]: severe 3 vessel CAD with patent grafts, RHC: moderately elevated PCW and pulmonary hypertension  . Penile prosthesis implant N/A 07/24/2014    Procedure: SALINE PENILE INJECTION WITH DISSECTION OF CORPORA ,  IMPLANTATION OF COLOPLAST PENILE PROTHESIS INFLATABLE;  Surgeon: [name], MD;  Location: [facility];  Service: Urology;  Laterality: N/A;  . Removal of penile prosthesis N/A 08/22/2014    Procedure: EXPLANT OF INFECTED  PENILE PROSTHESIS;  Surgeon: [name], MD;  Location: [facility];  Service: Urology;   Laterality: N/A;  . Irrigation and debridement abscess Bilateral 12/14/2014    Procedure: Bilateral Corporal Irrigation with Cultures and Drainage, Penrose Drain Insertion;  Surgeon: [name], MD;  Location: [facility];  Service: Urology;  Laterality: Bilateral;   Social History:  History   Social History Narrative   He is a DNR- yellow sheet given to pt today.    Allergies  Allergen Reactions  . Amoxicillin-Pot Clavulanate Nausea And Vomiting  . Hydromorphone Hcl     "body started down"  08/22/14 - pt states no longer has problems with this  . Rifampin Nausea And Vomiting    Family History  Problem Relation Age of Onset  . Heart disease Other   . Hypertension Other   . Hypertension Mother   . Heart disease Mother   . Diabetes Mother   . Birth defects Paternal Uncle     unaware  . Birth defects Paternal Grandmother     breast    Prior to Admission medications   Medication Sig Start Date End Date Taking? Authorizing Provider  acetaminophen (TYLENOL) 500 MG tablet Take 1,000 mg by mouth every 6 (six) hours as needed (pain).    Historical Provider, MD  amitriptyline (ELAVIL) 25 MG tablet Take 1 tablet (25 mg total) by mouth at bedtime. 08/21/14   Dianne Dun, MD  aspirin 81 MG tablet Take 81 mg by mouth every morning.     Historical Provider, MD  calcium carbonate (TUMS - DOSED IN MG ELEMENTAL CALCIUM) 500 MG chewable tablet Chew 1-2 tablets (200-400 mg of elemental calcium total) by mouth 2 (two) times daily as needed for indigestion or heartburn. 12/21/14   Richarda Overlie, MD  carvedilol (COREG) 25 MG tablet Take 25 mg by mouth 2 (two) times daily with a meal.    Historical Provider, MD  cinacalcet (SENSIPAR) 60 MG tablet Take 60 mg by mouth at bedtime. With biggest meal 02/16/11   Dianne Dun, MD  citalopram (CELEXA) 20 MG tablet TAKE 1 BY MOUTH DAILY Patient taking differently: TAKE 1 BY MOUTH NIGHTLY AT BEDTIME. 09/27/14   Dianne Dun, MD  cloNIDine (CATAPRES) 0.1 MG  tablet Take 0.1 mg by mouth 2 (two) times daily.    Historical Provider, MD  furosemide (LASIX) 80 MG tablet Take 80 mg by mouth 2 (two) times daily.    Historical Provider, MD  HYDROcodone-acetaminophen (NORCO/VICODIN) 5-325 MG per tablet Take 1 tablet by mouth every 4 (four) hours as needed for moderate pain. 12/21/14   Richarda Overlie, MD  insulin aspart (NOVOLOG) 100 UNIT/ML injection Inject 7 Units into the skin 3 (three) times daily with meals. 12/21/14   Richarda Overlie, MD  insulin glargine (LANTUS) 100  UNIT/ML injection Inject 0.3 mLs (30 Units total) into the skin daily. 12/21/14   Richarda Overlie, MD  isosorbide mononitrate (IMDUR) 30 MG 24 hr tablet Take 30 mg by mouth 2 (two) times daily.    Historical Provider, MD  levothyroxine (SYNTHROID, LEVOTHROID) 75 MCG tablet Take 1 tablet (75 mcg total) by mouth daily before breakfast. 07/26/13   Dianne Dun, MD  LYRICA 150 MG capsule TAKE 1 CAPSULE BY MOUTH EVERY MORNING AND 2 CAPSULE EVERY EVENING 10/01/14   Dianne Dun, MD  Nutritional Supplements (FEEDING SUPPLEMENT, NEPRO CARB STEADY,) LIQD Take 237 mLs by mouth as needed (missed meal during dialysis.). 08/24/14   Richarda Overlie, MD  nystatin-triamcinolone (MYCOLOG II) cream Apply 1 application topically every 8 (eight) hours as needed (rash).     Historical Provider, MD  Omega-3 Fatty Acids (OMEGA 3 PO) Take 1,000 mg by mouth daily.    Historical Provider, MD  omeprazole (PRILOSEC) 40 MG capsule Take 40 mg by mouth every morning.     Historical Provider, MD  PARoxetine (PAXIL) 20 MG tablet Take 20 mg by mouth every morning.     Historical Provider, MD  permethrin (ELIMITE) 5 % cream APPLY TOPICALLY ONCE AS DIRECTED 12/31/14   Dianne Dun, MD  rosuvastatin (CRESTOR) 20 MG tablet Take 1 tablet (20 mg total) by mouth at bedtime. 12/13/12   Dianne Dun, MD  sevelamer carbonate (RENVELA) 800 MG tablet Take 800 mg by mouth 3 (three) times daily with meals.    Historical Provider, MD   Physical Exam: Filed  Vitals:   02/02/15 0200 02/02/15 0230 02/02/15 0300 02/02/15 0400  BP: 142/63 152/64 143/61 154/77  Pulse: 99 99 99 99  Temp:      TempSrc:      Resp:      SpO2: 100% 100% 100% 100%    Arousable but ill-appearing male no apparent distress Able to converse EOMI, NCAT, dry mucosa, moderate dentition, no JVD, S1 and S2 no murmur rub or gallop slightly tachycardic, chest clinically clear, no added sounds Abdomen soft nontender nondistended no rebound No lower extremity edema Genitourinary exam-penis examined and presence of of athick rubor any other indication. Foley in situ. Glans penis appears enlarged slightly and red Skin-patient has the pathognomonic dermatomal rash his parietal and temporal region of his head. Grossly intact neurologically able to move all 4 limbs equally no focal but is weak overall  Labs on Admission:  Basic Metabolic Panel:  Recent Labs Lab 02/02/15 0218  NA 130*  K 4.5  CL 85*  CO2 8*  GLUCOSE 590*  BUN 31*  CREATININE 6.21*  CALCIUM 8.9   Liver Function Tests:  Recent Labs Lab 02/02/15 0218  AST 17  ALT 10  ALKPHOS 282*  BILITOT 2.3*  PROT 7.6  ALBUMIN 3.2*   No results for input(s): LIPASE, AMYLASE in the last 168 hours. No results for input(s): AMMONIA in the last 168 hours. CBC:  Recent Labs Lab 02/02/15 0218  WBC 14.0*  NEUTROABS 12.8*  HGB 11.2*  HCT 37.1*  MCV 94.4  PLT 238   Cardiac Enzymes: No results for input(s): CKTOTAL, CKMB, CKMBINDEX, TROPONINI in the last 168 hours.  BNP (last 3 results) No results for input(s): BNP in the last 8760 hours.  ProBNP (last 3 results)  Recent Labs  03/26/14 1555  PROBNP 1212.0*    CBG:  Recent Labs Lab 02/02/15 0221 02/02/15 0413  GLUCAP 536* 537*    Radiological Exams on Admission:  No results found.  EKG: Independently reviewed. None performed   sment/Plan Principal Problem:   DKA (diabetic ketoacidoses)-severe-extremely acidotic,, pH 7.11 anion gap 37, PCO2  8,  will need stepdown admission. Might need consideration of bicarbonate if pH is less than 6.9 on repeat that gas which will be done in about at about 8 AM  Patient was given 10 units of insulin and still sugars in the 500 range Insulin stabilizer has not been started as this has not come from pharmacies at I have asked the emergency room physician repeat insulin we will continue to give fluids Patient has received IV bolus X2 normal saline and is currently on sodium chloride 1 25 cc an hour which is reasonable as phase I coverage. Nursing aware to call M.D. for transition phase 2 orders once his gap clears which will take a while Usual home dosing = Lantus 30 daily, sliding scale 3 times a day-he has not taken insulin recently We will hold his Lyrica 150 a.m., 300 p.m. for now  Active Problems:   Hypothyroidism-may give Synthroid with a sip of water if he is able to tolerate the same and 75 g per day, if not transition to IV Synthroid as per day.    MYOCARDIAL INFARCTION, HX OF   End stage renal disease 2/2 to DM ty 1-Dialysis started ~2009--Dialyses t/Th/Sat--Unclear reason why patient on Lasix 80 mg twice a day given end-stage renal disease-hold for now Please call nephrology in the morning to arrange for dialysis    S/P CABG (coronary artery bypass graft)-We will hold off on his Coreg 25 twice a day, Imdur 30 daily , his blood pressure seems reasonable. May continue aspirin water-    HTN (hypertension)-hold off on above medications, will place a patch of clonidine 0.1 milligrams patch for 7 days. 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 Although his white count is elevated at 2hemoconcentration. He has no  fever no chills and he was not told to start antibiotics prior to his office visit on 3/23 by Dr. Patsi Sears  please consult urology in the morning for guidance    Cellulitis of groin-erythematous but no specific issue. Hold antibiotics for now    Sepsis affecting skin-possible shingles?-Start valacyclovir via pharmacy.    Presumed full code  Time spen45 stepdown monitoring   Rhetta Mura [facility] Pager [phone]  If 7PM-7AM, please contact night-coverage [email or web address] Password [facility] 02/02/2015, 4:37 AM

## 2015-02-02 NOTE — Consult Note (Addendum)
Reason for Consult:Hisotry of penile prosthesis, funguria, ESRD  Referring Physician: Curly Rim MD  ISSAIH KAUS is an 44 y.o. male.   HPI:   1 - Impotent with Penile Prosthesis Explantation - s/p IPP implant 2015 by Gaynelle Arabian then explant for clinical infection (CX at explant with few GPC's on gram stain CX negative) and even repeat I+D 12/2014 also sterile by [place]. CT 12/2014 with complete explantation of all implant hardware.  Today penis remains engorged with some expressivle purulent material from left corpora, new left inguino-scrotal induration.   2 - Recurrent Pyuria, Funguria - pt ESRD with minimal UOP. Diabetic on insulin. Recurrent funguria, which at baseline pt supposed to be irrigated bladder QD. Sig Funguira noted on labs this admission. Imaging 2016 w/o stones, hydro.   3 - End-Stage Renal Disease - ESRD on IHD via UE fistula x years.  Today " Gerald Stabs" is seen in consultation for above. He is being admitted for DKA, suspect result from infection. He usually follows with Dr. Gaynelle Arabian a outpateint.     Past Medical History  Diagnosis Date  . End stage renal disease on dialysis     LUE fistula  . Type I diabetes mellitus     a. 03/2014 admitted with HNK to [facility]  . Diabetic neuropathy     severe, s/p multiple toe amputation  . Hypothyroidism   . Hypertension   . Hyperlipidemia   . H/O hiatal hernia   . GERD (gastroesophageal reflux disease)   . Anxiety   . Sebaceous cyst     side of neck  . Pneumonia     2010  . Anemia     a. req PRBC's 2011.  [name] PAD (peripheral artery disease)     a. s/p amputation of toes on the right;  b. left LE claudication.  . Coronary artery disease     a. s/p MI;  b. 10/2009 CABG x 3 @ [facility]: LIMA->LAD, VG->OM3, VG->RPDA; c. 11/2010 Cath 3/3 patent grafts;  d 12/2012 Cath: LM 30d, LAD 85p, D1 70, D2 90, LCX 40ost, OM2 100, RCA 90p, 13m L->LAD ok, VG->OM3 ok, VG->RPDA 30, EF 50%->Med Rx.  . Cataract     right  . Valvular disease      a. 11/2012 Echo: EF 55-60%, mild LVH, mild MR, mild bi-atrial enlargement, mild-mod TR, PASP 438mg.  . CHF (congestive heart failure)   . Depression   . Arthritis     rheumatoid arthritis   . Hx of transfusion of packed red blood cells   . Myocardial infarction 2010    Past Surgical History  Procedure Laterality Date  . Dialysis fistula creation      left upper arm fistula  . Amputation      TOES ON BOTH FEET  . Abscess drainage  Behind right ear/occipital scalp  . Skin graft    . Eye surgery  1999  . Coronary artery bypass graft  10/2009    [facility] (Dr. SmTamala Julian . Foot amputation    . Cardiac catheterization    . Cardiac catheterization  2/14    [facility]: severe 3 vessel CAD with patent grafts, RHC: moderately elevated PCW and pulmonary hypertension  . Penile prosthesis implant N/A 07/24/2014    Procedure: SALINE PENILE INJECTION WITH DISSECTION OF CORPORA ,  IMPLANTATION OF COLOPLAST PENILE PROTHESIS INFLATABLE;  Surgeon: SiAilene RudMD;  Location: [facility];  Service: Urology;  Laterality: N/A;  . Removal of penile prosthesis N/A 08/22/2014  Procedure: EXPLANT OF INFECTED  PENILE PROSTHESIS;  Surgeon: Jorja Loa, MD;  Location: [facility];  Service: Urology;  Laterality: N/A;  . Irrigation and debridement abscess Bilateral 12/14/2014    Procedure: Bilateral Corporal Irrigation with Cultures and Drainage, Penrose Drain Insertion;  Surgeon: Ailene Rud, MD;  Location: [facility];  Service: Urology;  Laterality: Bilateral;    Family History  Problem Relation Age of Onset  . Heart disease Other   . Hypertension Other   . Hypertension Mother   . Heart disease Mother   . Diabetes Mother   . Birth defects Paternal Uncle     unaware  . Birth defects Paternal Grandmother     breast    Social History:  reports that he has never smoked. He quit smokeless tobacco use about 19 years ago. His smokeless tobacco use included Chew. He reports that he drinks alcohol. He  reports that he does not use illicit drugs.  Allergies:  Allergies  Allergen Reactions  . Amoxicillin-Pot Clavulanate Nausea And Vomiting  . Hydromorphone Hcl     "body started down"  08/22/14 - pt states no longer has problems with this  . Rifampin Nausea And Vomiting    Medications: I have reviewed the patient's current medications.  Results for orders placed or performed during the hospital encounter of 02/02/15 (from the past 48 hour(s))  CBC with Differential/Platelet     Status: Abnormal   Collection Time: 02/02/15  2:18 AM  Result Value Ref Range   WBC 14.0 (H) 4.0 - 10.5 K/uL   RBC 3.93 (L) 4.22 - 5.81 MIL/uL   Hemoglobin 11.2 (L) 13.0 - 17.0 g/dL   HCT 37.1 (L) 39.0 - 52.0 %   MCV 94.4 78.0 - 100.0 fL   MCH 28.5 26.0 - 34.0 pg   MCHC 30.2 30.0 - 36.0 g/dL   RDW 15.6 (H) 11.5 - 15.5 %   Platelets 238 150 - 400 K/uL   Neutrophils Relative % 91 (H) 43 - 77 %   Neutro Abs 12.8 (H) 1.7 - 7.7 K/uL   Lymphocytes Relative 3 (L) 12 - 46 %   Lymphs Abs 0.4 (L) 0.7 - 4.0 K/uL   Monocytes Relative 6 3 - 12 %   Monocytes Absolute 0.8 0.1 - 1.0 K/uL   Eosinophils Relative 0 0 - 5 %   Eosinophils Absolute 0.0 0.0 - 0.7 K/uL   Basophils Relative 0 0 - 1 %   Basophils Absolute 0.0 0.0 - 0.1 K/uL  Comprehensive metabolic panel     Status: Abnormal   Collection Time: 02/02/15  2:18 AM  Result Value Ref Range   Sodium 130 (L) 135 - 145 mmol/L   Potassium 4.5 3.5 - 5.1 mmol/L   Chloride 85 (L) 96 - 112 mmol/L   CO2 8 (LL) 19 - 32 mmol/L    Comment: REPEATED TO VERIFY CRITICAL RESULT CALLED TO, READ BACK BY AND VERIFIED WITH: M.WILLIMANS RN [id number] 02/02/15 E.GADDY    Glucose, Bld 590 (HH) 70 - 99 mg/dL    Comment: REPEATED TO VERIFY CRITICAL RESULT CALLED TO, READ BACK BY AND VERIFIED WITH: M.WILLIMANS RN [id number] 02/02/15 E.GADDY    BUN 31 (H) 6 - 23 mg/dL   Creatinine, Ser 6.21 (H) 0.50 - 1.35 mg/dL   Calcium 8.9 8.4 - 10.5 mg/dL   Total Protein 7.6 6.0 - 8.3 g/dL   Albumin 3.2  (L) 3.5 - 5.2 g/dL   AST 17 0 - 37 U/L  ALT 10 0 - 53 U/L   Alkaline Phosphatase 282 (H) 39 - 117 U/L   Total Bilirubin 2.3 (H) 0.3 - 1.2 mg/dL   GFR calc non Af Amer 10 (L) >90 mL/min   GFR calc Af Amer 11 (L) >90 mL/min    Comment: (NOTE) The eGFR has been calculated using the CKD EPI equation. This calculation has not been validated in all clinical situations. eGFR's persistently <90 mL/min signify possible Chronic Kidney Disease.    Anion gap 37 (H) 5 - 15  CBG monitoring, ED     Status: Abnormal   Collection Time: 02/02/15  2:21 AM  Result Value Ref Range   Glucose-Capillary 536 (H) 70 - 99 mg/dL  Urinalysis, Routine w reflex microscopic     Status: Abnormal   Collection Time: 02/02/15  3:57 AM  Result Value Ref Range   Color, Urine AMBER (A) YELLOW    Comment: BIOCHEMICALS MAY BE AFFECTED BY COLOR   APPearance TURBID (A) CLEAR   Specific Gravity, Urine 1.021 1.005 - 1.030   pH 6.0 5.0 - 8.0   Glucose, UA NEGATIVE NEGATIVE mg/dL   Hgb urine dipstick LARGE (A) NEGATIVE   Bilirubin Urine SMALL (A) NEGATIVE   Ketones, ur 40 (A) NEGATIVE mg/dL   Protein, ur >300 (A) NEGATIVE mg/dL   Urobilinogen, UA 0.2 0.0 - 1.0 mg/dL   Nitrite NEGATIVE NEGATIVE   Leukocytes, UA LARGE (A) NEGATIVE  Urine microscopic-add on     Status: Abnormal   Collection Time: 02/02/15  3:57 AM  Result Value Ref Range   WBC, UA TOO NUMEROUS TO COUNT <3 WBC/hpf   RBC / HPF 7-10 <3 RBC/hpf   Bacteria, UA MANY (A) RARE   Urine-Other MANY YEAST   I-Stat arterial blood gas, ED     Status: Abnormal   Collection Time: 02/02/15  4:11 AM  Result Value Ref Range   pH, Arterial 7.119 (LL) 7.350 - 7.450   pCO2 arterial 15.5 (LL) 35.0 - 45.0 mmHg   pO2, Arterial 126.0 (H) 80.0 - 100.0 mmHg   Bicarbonate 5.0 (L) 20.0 - 24.0 mEq/L   TCO2 5 0 - 100 mmol/L   O2 Saturation 98.0 %   Acid-base deficit 22.0 (H) 0.0 - 2.0 mmol/L   Patient temperature 98.6 F    Collection site RADIAL, ALLEN'S TEST ACCEPTABLE     Drawn by Operator    Sample type ARTERIAL    Comment NOTIFIED PHYSICIAN   CBG monitoring, ED     Status: Abnormal   Collection Time: 02/02/15  4:13 AM  Result Value Ref Range   Glucose-Capillary 537 (H) 70 - 99 mg/dL  Comprehensive metabolic panel     Status: Abnormal   Collection Time: 02/02/15  5:09 AM  Result Value Ref Range   Sodium 131 (L) 135 - 145 mmol/L   Potassium 4.7 3.5 - 5.1 mmol/L   Chloride 88 (L) 96 - 112 mmol/L   CO2 8 (LL) 19 - 32 mmol/L    Comment: REPEATED TO VERIFY CRITICAL RESULT CALLED TO, READ BACK BY AND VERIFIED WITH: M.WHITE RN [id number] 02/02/15 E.GADDY    Glucose, Bld 574 (HH) 70 - 99 mg/dL    Comment: REPEATED TO VERIFY CRITICAL RESULT CALLED TO, READ BACK BY AND VERIFIED WITH: M.WHITE RN [id number] 02/02/15 E.GADDY    BUN 37 (H) 6 - 23 mg/dL   Creatinine, Ser 5.94 (H) 0.50 - 1.35 mg/dL   Calcium 8.5 8.4 - 10.5 mg/dL   Total Protein 7.0  6.0 - 8.3 g/dL   Albumin 2.8 (L) 3.5 - 5.2 g/dL   AST 24 0 - 37 U/L   ALT 8 0 - 53 U/L   Alkaline Phosphatase 244 (H) 39 - 117 U/L   Total Bilirubin 3.2 (H) 0.3 - 1.2 mg/dL   GFR calc non Af Amer 10 (L) >90 mL/min   GFR calc Af Amer 12 (L) >90 mL/min    Comment: (NOTE) The eGFR has been calculated using the CKD EPI equation. This calculation has not been validated in all clinical situations. eGFR's persistently <90 mL/min signify possible Chronic Kidney Disease.    Anion gap 35 (H) 5 - 15  CBC     Status: Abnormal   Collection Time: 02/02/15  5:09 AM  Result Value Ref Range   WBC 14.8 (H) 4.0 - 10.5 K/uL   RBC 3.69 (L) 4.22 - 5.81 MIL/uL   Hemoglobin 10.5 (L) 13.0 - 17.0 g/dL   HCT 34.7 (L) 39.0 - 52.0 %   MCV 94.0 78.0 - 100.0 fL   MCH 28.5 26.0 - 34.0 pg   MCHC 30.3 30.0 - 36.0 g/dL   RDW 15.7 (H) 11.5 - 15.5 %   Platelets 210 150 - 400 K/uL  Protime-INR     Status: Abnormal   Collection Time: 02/02/15  5:09 AM  Result Value Ref Range   Prothrombin Time 19.2 (H) 11.6 - 15.2 seconds   INR 1.60 (H) 0.00 - 1.49   CBG monitoring, ED     Status: Abnormal   Collection Time: 02/02/15  5:18 AM  Result Value Ref Range   Glucose-Capillary 479 (H) 70 - 99 mg/dL  CBG monitoring, ED     Status: Abnormal   Collection Time: 02/02/15  6:53 AM  Result Value Ref Range   Glucose-Capillary 481 (H) 70 - 99 mg/dL  CBG monitoring, ED     Status: Abnormal   Collection Time: 02/02/15  8:04 AM  Result Value Ref Range   Glucose-Capillary 399 (H) 70 - 99 mg/dL  Basic metabolic panel     Status: Abnormal   Collection Time: 02/02/15  8:13 AM  Result Value Ref Range   Sodium 131 (L) 135 - 145 mmol/L   Potassium 3.4 (L) 3.5 - 5.1 mmol/L   Chloride 90 (L) 96 - 112 mmol/L   CO2 11 (L) 19 - 32 mmol/L   Glucose, Bld 479 (H) 70 - 99 mg/dL   BUN 37 (H) 6 - 23 mg/dL   Creatinine, Ser 6.01 (H) 0.50 - 1.35 mg/dL   Calcium 8.7 8.4 - 10.5 mg/dL   GFR calc non Af Amer 10 (L) >90 mL/min   GFR calc Af Amer 12 (L) >90 mL/min    Comment: (NOTE) The eGFR has been calculated using the CKD EPI equation. This calculation has not been validated in all clinical situations. eGFR's persistently <90 mL/min signify possible Chronic Kidney Disease.    Anion gap 30 (H) 5 - 15  CBG monitoring, ED     Status: Abnormal   Collection Time: 02/02/15  9:14 AM  Result Value Ref Range   Glucose-Capillary 375 (H) 70 - 99 mg/dL    No results found.  Review of Systems  Constitutional: Positive for malaise/fatigue. Negative for fever.  HENT: Negative.   Eyes: Negative.   Respiratory: Negative.   Cardiovascular: Negative.   Gastrointestinal: Negative.   Genitourinary: Negative for urgency, frequency, hematuria and flank pain.       Chronic penile wound / drainage  stable.   Musculoskeletal: Negative.   Skin: Negative.   Neurological: Negative.   Endo/Heme/Allergies: Negative.   Psychiatric/Behavioral: Negative.    Blood pressure 163/60, pulse 102, temperature 97 F (36.1 C), temperature source Rectal, resp. rate 18, weight 83.915 kg (185  lb), SpO2 100 %. Physical Exam  Constitutional: He appears well-developed and well-nourished.  HENT:  Head: Normocephalic.  Eyes: Pupils are equal, round, and reactive to light.  Neck: Normal range of motion.  Cardiovascular: Normal rate.   Respiratory: Effort normal.  GI: Soft.  Genitourinary:  Foley intact with clear urine. Engorged corpora with expressible purluulent fluid, induration left inguinal-scortal w/o skin changes in that location.   Musculoskeletal: Normal range of motion.  Neurological:  AOx1, Decreased level of consiosness.  Skin: Skin is warm.  Psychiatric: He has a normal mood and affect. His behavior is normal. Thought content normal.    Assessment/Plan:  1 - Impotent with Penile Prosthesis Explantation - althogh sterile on most recent sampleing x several, agree wound infection is clearly possible, bacerial v. Fungal. WCX and stat gram stain obtinaed in ER now. Pt NPO. Will obtain CT pelvis with and withotu to rule out pelvic abscess. May need repeat operative debridement pending imagign findings.   Have discussed imagign with primary team and we both agree that clinical situation warrants contrast exam. He is planned for dialysis this admission.   2 - Recurrent Pyuria, Funguria - likely sequelae of diabetes. Reinforced need for excellent glycemic control and need for at least daily bladder irrigation, preferably with dilute acetic acid solution (vinager).   3 - End-Stage Renal Disease - continue dialysis.   4 - Will follow.    Suzy Kugel 02/02/2015, 9:56 AM     CT confirms walled off complex fluid collection with gas along left inguinal / penile base area, no deep pelvis or abdominal infolvment. Gram stain with gram negatives.   Will plan for OR tomorrow AM for debridement. Pt to get dialysis today, IV insulin to help with acidosis / DKA prior. I think that is safest order as this is likely very chronic infection.

## 2015-02-02 NOTE — Progress Notes (Addendum)
[facility] PROGRESS NOTE  Taylor [name] ZOX:[id number] DOB: [birth year] DOA: 02/02/2015  PCP: [name], MD  Brief HPI: 44 year old caucasian male with a past medical history of end-stage renal disease on hemodialysis on Tuesday, Thursday, Saturdays, type one diabetes with diabetic neuropathy, history of coronary artery disease status post CABG, history of penile prosthesis, status post infection with removal of hardware, who presented with a three-day history of nausea and vomiting. He was noted to have significantly elevated blood glucose with evidence for diabetic ketoacidosis. There was also suspicion of infection in his penis. Patient was admitted to the hospital for further management.  Past medical history:  Past Medical History  Diagnosis Date  . End stage renal disease on dialysis     LUE fistula  . Type I diabetes mellitus     a. 03/2014 admitted with HNK to [facility].  . Diabetic neuropathy     severe, s/p multiple toe amputation  . Hypothyroidism   . Hypertension   . Hyperlipidemia   . H/O hiatal hernia   . GERD (gastroesophageal reflux disease)   . Anxiety   . Sebaceous cyst     side of neck  . Pneumonia     2010  . Anemia     a. req PRBC's 2011.  [name] PAD (peripheral artery disease)     a. s/p amputation of toes on the right;  b. left LE claudication.  . Coronary artery disease     a. s/p MI;  b. 10/2009 CABG x 3 @ [facility]: LIMA->LAD, VG->OM3, VG->RPDA; c. 11/2010 Cath 3/3 patent grafts;  d 12/2012 Cath: LM 30d, LAD 85p, D1 70, D2 90, LCX 40ost, OM2 100, RCA 90p, 199m, L->LAD ok, VG->OM3 ok, VG->RPDA 30, EF 50%->Med Rx.  . Cataract     right  . Valvular disease     a. 11/2012 Echo: EF 55-60%, mild LVH, mild MR, mild bi-atrial enlargement, mild-mod TR, PASP .  . CHF (congestive heart failure)   . Depression   . Arthritis     rheumatoid arthritis   . Hx of transfusion of packed red blood cells   . Myocardial infarction 2010    Consultants: Nephrology  and urology  Procedures:  Hemodialysis  Antibiotics: Cefepime 3/26 Diflucan 3/26 Valtrex 3/26  Subjective: Patient is awake, alert but confused. Denies any pain in his genitalia. Denies any nausea, vomiting currently. No vomiting since last night. Denies any abdominal pain. His father is at the bedside.  Objective: Vital Signs  Filed Vitals:   02/02/15 1151 02/02/15 1200 02/02/15 1215 02/02/15 1230  BP: 165/73 166/73  164/72  Pulse: 100 99  104  Temp:      TempSrc:      Resp: 24  17 26   Weight:      SpO2: 98% 98%  100%    Intake/Output Summary (Last 24 hours) at 02/02/15 1245 Last data filed at 02/02/15 1227  Gross per 24 hour  Intake 3002.08 ml  Output      0 ml  Net 3002.08 ml    General appearance: alert, cooperative, appears stated age, distracted and no distress Resp: clear to auscultation bilaterally Cardio: regular rate and rhythm, S1, S2 normal, no murmur, click, rub or gallop GI: soft, non-tender; bowel sounds normal; no masses,  no organomegaly GU: Penis is noted to be swollen. It's quite firm to palpation. Yellowish exudate noted from the glans. There is a Foley catheter in place. Extremities: extremities normal, atraumatic, no cyanosis or edema Neurologic:  Awake, alert. He knew he was in a hospital. He could identify his father. He knew the year and the month. Confused. Cranial nerves II-12 intact. No other focal neurological deficits noted.  Lab Results:  Basic Metabolic Panel:  Recent Labs Lab 02/02/15 0218 02/02/15 0509 02/02/15 0813  NA 130* 131* 131*  K 4.5 4.7 3.4*  CL 85* 88* 90*  CO2 8* 8* 11*  GLUCOSE 590* 574* 479*  BUN 31* 37* 37*  CREATININE 6.21* 5.94* 6.01*  CALCIUM 8.9 8.5 8.7   Liver Function Tests:  Recent Labs Lab 02/02/15 0218 02/02/15 0509  AST 17 24  ALT 10 8  ALKPHOS 282* 244*  BILITOT 2.3* 3.2*  PROT 7.6 7.0  ALBUMIN 3.2* 2.8*   CBC:  Recent Labs Lab 02/02/15 0218 02/02/15 0509  WBC 14.0* 14.8*    NEUTROABS 12.8*  --   HGB 11.2* 10.5*  HCT 37.1* 34.7*  MCV 94.4 94.0  PLT 238 210   CBG:  Recent Labs Lab 02/02/15 0804 02/02/15 0914 02/02/15 1008 02/02/15 1116 02/02/15 1213  GLUCAP 399* 375* 392* 323* 308*    Studies/Results: Ct Pelvis W/o & W Cm  02/02/2015   CLINICAL DATA:  Status post penile prosthesis placement and removal, with underlying chronic wound. Assess for pelvic or inguinal abscess.  EXAM: CT PELVIS WITH AND WITHOUT CONTRAST  TECHNIQUE: Multidetector CT imaging of the pelvis was performed following the standard protocol before and following the bolus administration of intravenous contrast.  CONTRAST:  OMNIPAQUE IOHEXOL 300 MG/ML  SOLN  COMPARISON:  CT of the abdomen and pelvis from 12/19/2014  FINDINGS: There is a focal 6.2 x 4.0 x 4.0 cm collection of fluid and air at the inferior left inguinal region directly adjacent to the left side of the penile shaft, anterior to the left spermatic cord, with minimal peripheral enhancement on contrast enhanced images, compatible with an abscess.  In addition, there is a collection of fluid and trace air extending along the left corpora cavernosum within the distal penis, measuring approximately 4.6 x 2.2 x 1.6 cm, with minimal peripheral enhancement. There may be minimal communication between these two collections, suggested on noncontrast images, though not definitely seen on contrast-enhanced images.  Surrounding soft tissue inflammation is seen. Edema is noted about the scrotum. 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 Mild soft tissue edema is noted extending superiorly, with mild postoperative soft tissue edema at the right inguinal region, and mild soft tissue edema along both flanks.  Minimal soft tissue air at the anterior right lower quadrant abdominal wall is thought to reflect an injection site.  A small to moderate amount of borderline complex fluid is noted within the lower abdomen and pelvis. There is somewhat unusual  wall thickening at the cecum, distal descending colon and distal sigmoid colon. This could reflect underlying mild colitis, though a cecal mass cannot be entirely excluded. Would correlate for evidence of colitis, and after resolution of the acute process, would consider colonoscopy to assess the ascending colon and cecum.  There is decompression of the bladder, with a Foley catheter in place. Diffuse bladder wall thickening may reflect decompression, though would correlate for evidence of cystitis. Minimal vascular calcifications are seen. Visualized inguinal nodes are normal in size. The prostate is borderline normal in size.  No acute osseous abnormalities are seen.  IMPRESSION: 1. 6.2 x 4.0 x 4.7 cm evolving abscess noted at the inferior left inguinal region, directly adjacent to the left side of penile shaft and  anterior to the left spermatic cord, containing fluid and air. 2. 4.6 x 2.2 x 1.6 cm likely evolving abscess noted extending along the left corpora cavernosum within the distal penis, containing fluid and trace air. There may be minimal communication between these two fluid collections, though this is not well seen. 3. Surrounding soft tissue inflammation and edema seen about the scrotum. 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 Mild soft tissue edema extends superiorly, with mild postoperative soft tissue edema at the right inguinal region, and mild soft tissue edema along both flanks. 4. Somewhat unusual wall thickening at the cecum, distal descending colon and distal sigmoid colon. This could reflect underlying acute mild infectious or inflammatory colitis. Would correlate clinically for associated findings. A cecal mass cannot be entirely excluded. After resolution of the acute process, would consider colonoscopy to assess the ascending colon and cecum. 5. Small to moderate amount of borderline complex fluid noted within the lower abdomen and pelvis. 6. Bladder decompressed and not well assessed;  diffuse bladder wall thickening likely reflects decompression, though would correlate to exclude cystitis.  These results were called by telephone at the time of interpretation on 02/02/2015 at 12:10 pm to Dr. [name], who verbally acknowledged these results.   Electronically Signed   By: Roanna Raider M.D.   On: 02/02/2015 12:11    Medications:  Scheduled: . aspirin  81 mg Oral q morning - 10a  . cloNIDine  0.1 mg Transdermal Weekly  . fluconazole  200 mg Oral QHS  . heparin  5,000 Units Subcutaneous 3 times per day  . sodium chloride  3 mL Intravenous Q12H  . valACYclovir  1,000 mg Oral QHS   Continuous: . sodium chloride 75 mL/hr at 02/02/15 1229  . [START ON 02/05/2015] ceFEPime (MAXIPIME) IV    . insulin (NOVOLIN-R) infusion 14.9 Units/hr (02/02/15 1225)  . insulin regular Stopped (02/02/15 1107)   TGG:YIRSWNIOEVO (ZOFRAN) IV  Assessment/Plan:  Principal Problem:   DKA (diabetic ketoacidoses) Active Problems:   Hypothyroidism   MYOCARDIAL INFARCTION, HX OF   End stage renal disease 2/2 to DM ty 1-Dialysis started ~2009--Dialyses t/Th/Sat-[place]   S/P CABG (coronary artery bypass graft)   HTN (hypertension)   Panic attacks   Organic sexual dysfunction   Cellulitis of groin   Sepsis affecting skin   DKA, type 1    Severe DKA (diabetic ketoacidoses) ABGs revealed a pH of 7.1. Patient has been initiated on intravenous insulin. Continue current management. Monitor BMET on a regular basis. DKA is likely precipitated by penile abscess. HbA1c was 15.4 in February. Continue IV fluids cautiously considering his renal failure.  Penile abscess with other GU abnormalities noted on CT Patient has an elevated WBC. He was afebrile. He did have some yellowish exudate from his glands. Patient had been seen recently by his urologist. However, his penis did look very suspicious for being infectious. Urology has been consulted. A CT scan was done which indeed shows abscesses.  Patient could be septic in the setting of this finding. We will check lactic acid levels. He will be treated with the cefepime. Gram stain from the exudate shows gram-negative cocci. He's had previously yeast in his urine. We'll continue with Diflucan. Urology to take patient to the OR tomorrow morning.  Abnormal appearing colon including cecum on CT scan This will need further evaluation once he is more stable from the above issues. There is no history of any diarrhea. We'll need to discuss with the patient regarding any history  of endoscopies.  Hypothyroidism Continue Synthroid.  Coronary artery disease status post CABG Stable. Continue aspirin. Resume beta blocker as soon as able.  End stage renal disease 2/2 to DM 1 on dialysis on Tuesday, Thursday, Saturday Unclear reason why patient on Lasix 80 mg twice a day given end-stage renal disease. Holding for now. Nephrology has been consulted. He'll be dialyzed today.  Essential HTN (hypertension) Blood pressure is surprisingly stable considering all the active issues. Most of his blood pressure lowering agents are being held for now. Clonidine patch is being continued to avoid rebound hypertension.  Panic attacks He is on multiple psychotropic agents at home. These are being held due to his alteration in mental status.  Acute encephalopathy No neurological deficits. Encephalopathy, likely due to infection as well as DKA. Monitor neurological status closely.   Possible shingles involving scalp Continue Valtrex. Contact and airborne precautions.  ADDENDUM Called by Rn that patient got agitated at dialysis. He was seen at bedside. He was awake but appeared delirious. Moving all his extremities. He was in danger of pulling out his lines. He was ordered Ativan and then haldol and he calmed down. Monitor closely for now. No clear indication for neuro imaging at this time. Brother updated at bedside.  DVT Prophylaxis: Heparin    Code Status:  Full code  Family Communication: Discussed with the patient and his father  Disposition Plan: Await transfer to stepdown.    LOS: 0 days   [facility]  [facility] Pager [phone] 02/02/2015, 12:45 PM  If 7PM-7AM, please contact night-coverage at [email or web address], password [facility]

## 2015-02-02 NOTE — Procedures (Signed)
I was present at this dialysis session, have reviewed the session itself and made  appropriate changes  Vinson Moselle MD (pgr) [phone]    ([phone] 02/02/2015, 3:36 PM

## 2015-02-02 NOTE — Progress Notes (Signed)
Notified Md about pt's latic acid 2.2 and glucostablizer  Stopped earlier by [employer].  Clarification of orders needed.  Will continue to monitor. Taylor [name]

## 2015-02-02 NOTE — ED Notes (Signed)
Reported to [facility] lab results, patient is currently on an insulin drip.

## 2015-02-02 NOTE — ED Notes (Signed)
CBG = 399  RN Marylene Land informed of result.

## 2015-02-03 ENCOUNTER — Inpatient Hospital Stay (HOSPITAL_COMMUNITY): Payer: Medicare Other | Admitting: Anesthesiology

## 2015-02-03 ENCOUNTER — Encounter (HOSPITAL_COMMUNITY)
Admission: EM | Disposition: A | Discharge status: Skilled Nursing Facility | Payer: Self-pay | Source: Home / Self Care | Attending: Internal Medicine

## 2015-02-03 ENCOUNTER — Inpatient Hospital Stay (HOSPITAL_COMMUNITY): Payer: Medicare Other

## 2015-02-03 DIAGNOSIS — E038 Other specified hypothyroidism: Secondary | ICD-10-CM

## 2015-02-03 DIAGNOSIS — G9340 Encephalopathy, unspecified: Secondary | ICD-10-CM

## 2015-02-03 DIAGNOSIS — I10 Essential (primary) hypertension: Secondary | ICD-10-CM

## 2015-02-03 HISTORY — PX: CYSTOSCOPY: SHX5120

## 2015-02-03 HISTORY — PX: SCROTAL EXPLORATION: SHX2386

## 2015-02-03 LAB — SURGICAL PCR SCREEN
MRSA, PCR: NEGATIVE
STAPHYLOCOCCUS AUREUS: POSITIVE — AB

## 2015-02-03 LAB — BASIC METABOLIC PANEL
ANION GAP: 14 (ref 5–15)
Anion gap: 21 — ABNORMAL HIGH (ref 5–15)
Anion gap: 16 — ABNORMAL HIGH (ref 5–15)
Anion gap: 16 — ABNORMAL HIGH (ref 5–15)
BUN: 19 mg/dL (ref 6–23)
BUN: 21 mg/dL (ref 6–23)
BUN: 21 mg/dL (ref 6–23)
BUN: 23 mg/dL (ref 6–23)
CALCIUM: 8.7 mg/dL (ref 8.4–10.5)
CO2: 17 mmol/L — AB (ref 19–32)
CO2: 23 mmol/L (ref 19–32)
CO2: 23 mmol/L (ref 19–32)
CO2: 21 mmol/L (ref 19–32)
Calcium: 9.2 mg/dL (ref 8.4–10.5)
Calcium: 9.1 mg/dL (ref 8.4–10.5)
Calcium: 8.9 mg/dL (ref 8.4–10.5)
Chloride: 94 mmol/L — ABNORMAL LOW (ref 96–112)
Chloride: 96 mmol/L (ref 96–112)
Chloride: 99 mmol/L (ref 96–112)
Chloride: 96 mmol/L (ref 96–112)
Creatinine, Ser: 3.78 mg/dL — ABNORMAL HIGH (ref 0.50–1.35)
Creatinine, Ser: 4.04 mg/dL — ABNORMAL HIGH (ref 0.50–1.35)
Creatinine, Ser: 4.26 mg/dL — ABNORMAL HIGH (ref 0.50–1.35)
Creatinine, Ser: 4.4 mg/dL — ABNORMAL HIGH (ref 0.50–1.35)
GFR calc Af Amer: 21 mL/min — ABNORMAL LOW (ref >90)
GFR calc Af Amer: 18 mL/min — ABNORMAL LOW (ref >90)
GFR calc Af Amer: 17 mL/min — ABNORMAL LOW (ref >90)
GFR calc non Af Amer: 15 mL/min — ABNORMAL LOW (ref >90)
GFR calc non Af Amer: 15 mL/min — ABNORMAL LOW (ref >90)
GFR, EST AFRICAN AMERICAN: 19 mL/min — AB (ref >90)
GFR, EST NON AFRICAN AMERICAN: 18 mL/min — AB (ref >90)
GFR, EST NON AFRICAN AMERICAN: 16 mL/min — AB (ref >90)
GLUCOSE: 284 mg/dL — AB (ref 70–99)
GLUCOSE: 166 mg/dL — AB (ref 70–99)
Glucose, Bld: 232 mg/dL — ABNORMAL HIGH (ref 70–99)
Glucose, Bld: 145 mg/dL — ABNORMAL HIGH (ref 70–99)
Potassium: 3.3 mmol/L — ABNORMAL LOW (ref 3.5–5.1)
Potassium: 3.1 mmol/L — ABNORMAL LOW (ref 3.5–5.1)
Potassium: 3.7 mmol/L (ref 3.5–5.1)
Potassium: 4.5 mmol/L (ref 3.5–5.1)
SODIUM: 132 mmol/L — AB (ref 135–145)
Sodium: 135 mmol/L (ref 135–145)
Sodium: 136 mmol/L (ref 135–145)
Sodium: 133 mmol/L — ABNORMAL LOW (ref 135–145)

## 2015-02-03 LAB — POCT I-STAT 4, (NA,K, GLUC, HGB,HCT)
Glucose, Bld: 301 mg/dL — ABNORMAL HIGH (ref 70–99)
HCT: 35 % — ABNORMAL LOW (ref 39.0–52.0)
Hemoglobin: 11.9 g/dL — ABNORMAL LOW (ref 13.0–17.0)
Potassium: 3.3 mmol/L — ABNORMAL LOW (ref 3.5–5.1)
Sodium: 135 mmol/L (ref 135–145)

## 2015-02-03 LAB — BLOOD GAS, ARTERIAL
ACID-BASE EXCESS: 1.5 mmol/L (ref 0.0–2.0)
BICARBONATE: 25.7 meq/L — AB (ref 20.0–24.0)
DRAWN BY: 277331
O2 Content: 2 L/min
O2 Saturation: 98.8 %
PATIENT TEMPERATURE: 97.9
PCO2 ART: 41.5 mmHg (ref 35.0–45.0)
TCO2: 27 mmol/L (ref 0–100)
pH, Arterial: 7.407 (ref 7.350–7.450)
pO2, Arterial: 113 mmHg — ABNORMAL HIGH (ref 80.0–100.0)

## 2015-02-03 LAB — GLUCOSE, CAPILLARY
GLUCOSE-CAPILLARY: 284 mg/dL — AB (ref 70–99)
GLUCOSE-CAPILLARY: 303 mg/dL — AB (ref 70–99)
GLUCOSE-CAPILLARY: 265 mg/dL — AB (ref 70–99)
GLUCOSE-CAPILLARY: 123 mg/dL — AB (ref 70–99)
GLUCOSE-CAPILLARY: 140 mg/dL — AB (ref 70–99)
Glucose-Capillary: 199 mg/dL — ABNORMAL HIGH (ref 70–99)
Glucose-Capillary: 232 mg/dL — ABNORMAL HIGH (ref 70–99)
Glucose-Capillary: 279 mg/dL — ABNORMAL HIGH (ref 70–99)
Glucose-Capillary: 221 mg/dL — ABNORMAL HIGH (ref 70–99)
Glucose-Capillary: 138 mg/dL — ABNORMAL HIGH (ref 70–99)
Glucose-Capillary: 163 mg/dL — ABNORMAL HIGH (ref 70–99)
Glucose-Capillary: 172 mg/dL — ABNORMAL HIGH (ref 70–99)

## 2015-02-03 LAB — CBC
HEMATOCRIT: 31.6 % — AB (ref 39.0–52.0)
Hemoglobin: 10.1 g/dL — ABNORMAL LOW (ref 13.0–17.0)
MCH: 28.5 pg (ref 26.0–34.0)
MCHC: 32 g/dL (ref 30.0–36.0)
MCV: 89 fL (ref 78.0–100.0)
PLATELETS: 136 10*3/uL — AB (ref 150–400)
RBC: 3.55 MIL/uL — AB (ref 4.22–5.81)
RDW: 15.5 % (ref 11.5–15.5)
WBC: 7.8 10*3/uL (ref 4.0–10.5)

## 2015-02-03 LAB — TSH: TSH: 12.023 u[IU]/mL — ABNORMAL HIGH (ref 0.350–4.500)

## 2015-02-03 LAB — LACTIC ACID, PLASMA: Lactic Acid, Venous: 1.4 mmol/L (ref 0.5–2.0)

## 2015-02-03 SURGERY — EXPLORATION, SCROTUM
Anesthesia: General | Site: Penis

## 2015-02-03 MED ORDER — ONDANSETRON HCL 4 MG/2ML IJ SOLN: 4.0000 mg | Freq: Once | INTRAMUSCULAR | Status: AC | PRN

## 2015-02-03 MED ORDER — INSULIN GLARGINE 100 UNIT/ML ~~LOC~~ SOLN
15.0000 [IU] | Freq: Every day | SUBCUTANEOUS | Status: DC
  Administered 2015-02-03: 15 [IU] via SUBCUTANEOUS
  Filled 2015-02-03 (×2): qty 0.15

## 2015-02-03 MED ORDER — SODIUM CHLORIDE 0.9 % IV SOLN: INTRAVENOUS | Status: DC

## 2015-02-03 MED ORDER — DEXTROSE-NACL 5-0.45 % IV SOLN: INTRAVENOUS | Status: DC

## 2015-02-03 MED ORDER — HALOPERIDOL LACTATE 5 MG/ML IJ SOLN
2.5000 mg | Freq: Once | INTRAMUSCULAR | Status: AC
  Administered 2015-02-03: 2.5 mg via INTRAVENOUS

## 2015-02-03 MED ORDER — MEPERIDINE HCL 25 MG/ML IJ SOLN: 6.2500 mg | INTRAMUSCULAR | Status: DC | PRN

## 2015-02-03 MED ORDER — PROPOFOL 10 MG/ML IV BOLUS
INTRAVENOUS | Status: DC | PRN
  Administered 2015-02-03: 75 mg via INTRAVENOUS

## 2015-02-03 MED ORDER — MUPIROCIN 2 % EX OINT
1.0000 "application " | TOPICAL_OINTMENT | Freq: Two times a day (BID) | CUTANEOUS | Status: DC
  Administered 2015-02-03 – 2015-02-04 (×2): 1 via NASAL

## 2015-02-03 MED ORDER — SODIUM CHLORIDE 0.9 % IV SOLN
INTRAVENOUS | Status: AC
  Administered 2015-02-03: 1.6 [IU]/h via INTRAVENOUS
  Filled 2015-02-03: qty 2.5

## 2015-02-03 MED ORDER — ONDANSETRON HCL 4 MG/2ML IJ SOLN
INTRAMUSCULAR | Status: AC
  Filled 2015-02-03: qty 2

## 2015-02-03 MED ORDER — FENTANYL CITRATE 0.05 MG/ML IJ SOLN
INTRAMUSCULAR | Status: DC | PRN
  Administered 2015-02-03: 100 ug via INTRAVENOUS
  Administered 2015-02-03: 25 ug via INTRAVENOUS

## 2015-02-03 MED ORDER — MUPIROCIN 2 % EX OINT
TOPICAL_OINTMENT | Freq: Once | CUTANEOUS | Status: AC
  Administered 2015-02-03: 1 via NASAL

## 2015-02-03 MED ORDER — SUCCINYLCHOLINE CHLORIDE 20 MG/ML IJ SOLN
INTRAMUSCULAR | Status: DC | PRN
  Administered 2015-02-03: 100 mg via INTRAVENOUS

## 2015-02-03 MED ORDER — SODIUM CHLORIDE 0.9 % IV SOLN
INTRAVENOUS | Status: DC | PRN
  Administered 2015-02-03: 07:00:00 via INTRAVENOUS

## 2015-02-03 MED ORDER — MIDAZOLAM HCL 2 MG/2ML IJ SOLN
INTRAMUSCULAR | Status: AC
  Filled 2015-02-03: qty 2

## 2015-02-03 MED ORDER — METOPROLOL TARTRATE 1 MG/ML IV SOLN
5.0000 mg | Freq: Four times a day (QID) | INTRAVENOUS | Status: DC
Start: 2015-02-03 — End: 2015-02-08
  Administered 2015-02-03 – 2015-02-07 (×19): 5 mg via INTRAVENOUS
  Filled 2015-02-03 (×25): qty 5

## 2015-02-03 MED ORDER — INSULIN ASPART 100 UNIT/ML ~~LOC~~ SOLN
SUBCUTANEOUS | Status: DC | PRN
  Administered 2015-02-03: 8 [IU] via SUBCUTANEOUS

## 2015-02-03 MED ORDER — ONDANSETRON HCL 4 MG/2ML IJ SOLN
INTRAMUSCULAR | Status: DC | PRN
  Administered 2015-02-03: 4 mg via INTRAVENOUS

## 2015-02-03 MED ORDER — MORPHINE SULFATE 2 MG/ML IJ SOLN
1.0000 mg | INTRAMUSCULAR | Status: DC | PRN
  Administered 2015-02-03 – 2015-02-04 (×2): 1 mg via INTRAVENOUS
  Administered 2015-02-04: 2 mg via INTRAVENOUS
  Administered 2015-02-04: 1 mg via INTRAVENOUS
  Administered 2015-02-04: 2 mg via INTRAVENOUS
  Filled 2015-02-03 (×4): qty 1

## 2015-02-03 MED ORDER — HALOPERIDOL LACTATE 5 MG/ML IJ SOLN
2.5000 mg | Freq: Once | INTRAMUSCULAR | Status: AC
  Administered 2015-02-03: 2.5 mg via INTRAVENOUS
  Filled 2015-02-03: qty 1

## 2015-02-03 MED ORDER — DAKINS (FULL STRENGTH) SOLUTION 0.5%
CUTANEOUS | Status: DC | PRN
  Administered 2015-02-03: 1

## 2015-02-03 MED ORDER — MORPHINE SULFATE 2 MG/ML IJ SOLN
1.0000 mg | Freq: Once | INTRAMUSCULAR | Status: AC
  Administered 2015-02-03: 1 mg via INTRAVENOUS
  Filled 2015-02-03: qty 1

## 2015-02-03 MED ORDER — LIDOCAINE HCL (CARDIAC) 20 MG/ML IV SOLN
INTRAVENOUS | Status: AC
  Filled 2015-02-03: qty 5

## 2015-02-03 MED ORDER — INSULIN ASPART 100 UNIT/ML ~~LOC~~ SOLN: 0.0000 [IU] | SUBCUTANEOUS | Status: DC

## 2015-02-03 MED ORDER — POTASSIUM CHLORIDE 10 MEQ/100ML IV SOLN
10.0000 meq | INTRAVENOUS | Status: AC
  Administered 2015-02-03 (×4): 10 meq via INTRAVENOUS
  Filled 2015-02-03 (×4): qty 100

## 2015-02-03 MED ORDER — PROPOFOL 10 MG/ML IV BOLUS
INTRAVENOUS | Status: AC
  Filled 2015-02-03: qty 20

## 2015-02-03 MED ORDER — METOPROLOL TARTRATE 1 MG/ML IV SOLN: 5.0000 mg | Freq: Four times a day (QID) | INTRAVENOUS | Status: DC

## 2015-02-03 MED ORDER — MUPIROCIN 2 % EX OINT
TOPICAL_OINTMENT | CUTANEOUS | Status: AC
  Filled 2015-02-03: qty 22

## 2015-02-03 MED ORDER — SODIUM CHLORIDE 0.9 % IJ SOLN
INTRAMUSCULAR | Status: AC
  Filled 2015-02-03: qty 10

## 2015-02-03 MED ORDER — DAKINS (FULL STRENGTH) SOLUTION 0.5%
CUTANEOUS | Status: AC
Start: 2015-02-03 — End: 2015-02-04
  Filled 2015-02-03: qty 1000

## 2015-02-03 MED ORDER — LIDOCAINE HCL (CARDIAC) 20 MG/ML IV SOLN
INTRAVENOUS | Status: DC | PRN
  Administered 2015-02-03: 100 mg via INTRAVENOUS

## 2015-02-03 MED ORDER — CHLORHEXIDINE GLUCONATE CLOTH 2 % EX PADS
6.0000 | MEDICATED_PAD | Freq: Every day | CUTANEOUS | Status: DC
  Administered 2015-02-04: 6 via TOPICAL

## 2015-02-03 MED ORDER — FENTANYL CITRATE 0.05 MG/ML IJ SOLN
INTRAMUSCULAR | Status: AC
  Filled 2015-02-03: qty 5

## 2015-02-03 MED ORDER — HYDROMORPHONE HCL 1 MG/ML IJ SOLN: 0.2500 mg | INTRAMUSCULAR | Status: DC | PRN

## 2015-02-03 MED ORDER — SUCCINYLCHOLINE CHLORIDE 20 MG/ML IJ SOLN
INTRAMUSCULAR | Status: AC
  Filled 2015-02-03: qty 1

## 2015-02-03 SURGICAL SUPPLY — 10 items
BLADE SURG 10 STRL SS (BLADE) ×2 IMPLANT
BLADE SURG 15 STRL LF DISP TIS (BLADE) IMPLANT
BLADE SURG 15 STRL SS (BLADE) ×4
BNDG GAUZE ELAST 4 BULKY (GAUZE/BANDAGES/DRESSINGS) ×6 IMPLANT
CANISTER SUCT 3000ML (MISCELLANEOUS) ×2 IMPLANT
PENCIL BUTTON HOLSTER BLD 10FT (ELECTRODE) ×2 IMPLANT
SET CYSTO W/LG BORE CLAMP LF (SET/KITS/TRAYS/PACK) ×2 IMPLANT
TUBE CONNECTING 20'X1/4 (TUBING) ×1
TUBE CONNECTING 20X1/4 (TUBING) ×1 IMPLANT
YANKAUER SUCT BULB TIP NO VENT (SUCTIONS) ×2 IMPLANT

## 2015-02-03 NOTE — Progress Notes (Addendum)
[facility] PROGRESS NOTE  DEQUARIUS JEFFRIES GYK:[id number] DOB: 1971/03/10 DOA: 02/02/2015  PCP: [name], MD  Brief HPI: 44 year old caucasian male with a past medical history of end-stage renal disease on hemodialysis on Tuesday, Thursday, Saturdays, type one diabetes with diabetic neuropathy, history of coronary artery disease status post CABG, history of penile prosthesis, status post infection with removal of hardware, who presented with a three-day history of nausea and vomiting. He was noted to have significantly elevated blood glucose with evidence for diabetic ketoacidosis. There was also suspicion of infection in his penis. Patient was admitted to the hospital for further management. Patient noted to have penoscrotal abscess. He underwent drainage in the OR.  Past medical history:  Past Medical History  Diagnosis Date  . End stage renal disease on dialysis     LUE fistula  . Type I diabetes mellitus     a. 03/2014 admitted with HNK to [facility].  . Diabetic neuropathy     severe, s/p multiple toe amputation  . Hypothyroidism   . Hypertension   . Hyperlipidemia   . H/O hiatal hernia   . GERD (gastroesophageal reflux disease)   . Anxiety   . Sebaceous cyst     side of neck  . Pneumonia     2010  . Anemia     a. req PRBC's 2011.  [name] PAD (peripheral artery disease)     a. s/p amputation of toes on the right;  b. left LE claudication.  . Coronary artery disease     a. s/p MI;  b. 10/2009 CABG x 3 @ [facility]: LIMA->LAD, VG->OM3, VG->RPDA; c. 11/2010 Cath 3/3 patent grafts;  d 12/2012 Cath: LM 30d, LAD 85p, D1 70, D2 90, LCX 40ost, OM2 100, RCA 90p, 163m, L->LAD ok, VG->OM3 ok, VG->RPDA 30, EF 50%->Med Rx.  . Cataract     right  . Valvular disease     a. 11/2012 Echo: EF 55-60%, mild LVH, mild MR, mild bi-atrial enlargement, mild-mod TR, PASP .  . CHF (congestive heart failure)   . Depression   . Arthritis     rheumatoid arthritis   . Hx of transfusion of packed  red blood cells   . Myocardial infarction 2010  . Anginal pain     Consultants: Nephrology and urology  Procedures:  Hemodialysis TTS  I&D of Penoscrotal Abscess 3/27  Antibiotics: Cefepime 3/26 Diflucan 3/26 Valtrex 3/26  Subjective: Patient seen after surgery. Patient is sedated. Was agitated all night.   Objective: Vital Signs  Filed Vitals:   02/03/15 1000 02/03/15 1005 02/03/15 1015 02/03/15 1034  BP:  183/78 149/83 198/81  Pulse:      Temp:    97.9 F (36.6 C)  TempSrc:    Oral  Resp:      Height:      Weight:      SpO2: 100%       Intake/Output Summary (Last 24 hours) at 02/03/15 1036 Last data filed at 02/03/15 0920  Gross per 24 hour  Intake 1420.83 ml  Output   2675 ml  Net -1254.17 ml    General appearance: Sedated currently.  Resp: clear to auscultation bilaterally Cardio: regular rate and rhythm, S1, S2 normal, no murmur, click, rub or gallop GI: soft, non-tender; bowel sounds normal; no masses,  no organomegaly GU: Scrotal area and Penis covered in dressing. Foley noted.  Extremities: Partial foot amputation and toe amputation noted. Neurologic: Sedated. Just returned from OR. Pupils equal. Moving extremities with  painful stimuli.   Lab Results:  Basic Metabolic Panel:  Recent Labs Lab 02/02/15 0509 02/02/15 0813 02/02/15 1150 02/02/15 1604 02/03/15 0323 02/03/15 0751  NA 131* 131* 132* 136 132* 135  K 4.7 3.4* 3.3* 3.6 3.3* 3.3*  CL 88* 90* 95* 97 94*  --   CO2 8* 11* 17* 25 17*  --   GLUCOSE 574* 479* 358* 125* 284* 301*  BUN 37* 37* 38* 15 19  --   CREATININE 5.94* 6.01* 5.77* 3.11* 3.78*  --   CALCIUM 8.5 8.7 8.4 8.5 8.7  --    Liver Function Tests:  Recent Labs Lab 02/02/15 0218 02/02/15 0509  AST 17 24  ALT 10 8  ALKPHOS 282* 244*  BILITOT 2.3* 3.2*  PROT 7.6 7.0  ALBUMIN 3.2* 2.8*   CBC:  Recent Labs Lab 02/02/15 0218 02/02/15 0509 02/03/15 0323 02/03/15 0751  WBC 14.0* 14.8* 7.8  --   NEUTROABS 12.8*   --   --   --   HGB 11.2* 10.5* 10.1* 11.9*  HCT 37.1* 34.7* 31.6* 35.0*  MCV 94.4 94.0 89.0  --   PLT 238 210 136*  --    CBG:  Recent Labs Lab 02/03/15 0049 02/03/15 0416 02/03/15 0625 02/03/15 0853 02/03/15 0953  GLUCAP 232* 284* 303* 265* 279*    Studies/Results: Ct Pelvis W/o & W Cm  02/02/2015   CLINICAL DATA:  Status post penile prosthesis placement and removal, with underlying chronic wound. Assess for pelvic or inguinal abscess.  EXAM: CT PELVIS WITH AND WITHOUT CONTRAST  TECHNIQUE: Multidetector CT imaging of the pelvis was performed following the standard protocol before and following the bolus administration of intravenous contrast.  CONTRAST:  OMNIPAQUE IOHEXOL 300 MG/ML  SOLN  COMPARISON:  CT of the abdomen and pelvis from 12/19/2014  FINDINGS: There is a focal 6.2 x 4.0 x 4.0 cm collection of fluid and air at the inferior left inguinal region directly adjacent to the left side of the penile shaft, anterior to the left spermatic cord, with minimal peripheral enhancement on contrast enhanced images, compatible with an abscess.  In addition, there is a collection of fluid and trace air extending along the left corpora cavernosum within the distal penis, measuring approximately 4.6 x 2.2 x 1.6 cm, with minimal peripheral enhancement. There may be minimal communication between these two collections, suggested on noncontrast images, though not definitely seen on contrast-enhanced images.  Surrounding soft tissue inflammation is seen. Edema is noted about the scrotum. 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 Mild soft tissue edema is noted extending superiorly, with mild postoperative soft tissue edema at the right inguinal region, and mild soft tissue edema along both flanks.  Minimal soft tissue air at the anterior right lower quadrant abdominal wall is thought to reflect an injection site.  A small to moderate amount of borderline complex fluid is noted within the lower abdomen and  pelvis. There is somewhat unusual wall thickening at the cecum, distal descending colon and distal sigmoid colon. This could reflect underlying mild colitis, though a cecal mass cannot be entirely excluded. Would correlate for evidence of colitis, and after resolution of the acute process, would consider colonoscopy to assess the ascending colon and cecum.  There is decompression of the bladder, with a Foley catheter in place. Diffuse bladder wall thickening may reflect decompression, though would correlate for evidence of cystitis. Minimal vascular calcifications are seen. Visualized inguinal nodes are normal in size. The prostate is borderline normal in  size.  No acute osseous abnormalities are seen.  IMPRESSION: 1. 6.2 x 4.0 x 4.7 cm evolving abscess noted at the inferior left inguinal region, directly adjacent to the left side of penile shaft and anterior to the left spermatic cord, containing fluid and air. 2. 4.6 x 2.2 x 1.6 cm likely evolving abscess noted extending along the left corpora cavernosum within the distal penis, containing fluid and trace air. There may be minimal communication between these two fluid collections, though this is not well seen. 3. Surrounding soft tissue inflammation and edema seen about the scrotum. 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 Mild soft tissue edema extends superiorly, with mild postoperative soft tissue edema at the right inguinal region, and mild soft tissue edema along both flanks. 4. Somewhat unusual wall thickening at the cecum, distal descending colon and distal sigmoid colon. This could reflect underlying acute mild infectious or inflammatory colitis. Would correlate clinically for associated findings. A cecal mass cannot be entirely excluded. After resolution of the acute process, would consider colonoscopy to assess the ascending colon and cecum. 5. Small to moderate amount of borderline complex fluid noted within the lower abdomen and pelvis. 6. Bladder  decompressed and not well assessed; diffuse bladder wall thickening likely reflects decompression, though would correlate to exclude cystitis.  These results were called by telephone at the time of interpretation on 02/02/2015 at 12:10 pm to Dr. [name], who verbally acknowledged these results.   Electronically Signed   By: Roanna Raider M.D.   On: 02/02/2015 12:11    Medications:  Scheduled: . aspirin  81 mg Oral q morning - 10a  . ceFEPime (MAXIPIME) IV  2 g Intravenous Q T,Th,Sa-HD  . cloNIDine  0.1 mg Transdermal Weekly  . fluconazole  200 mg Oral QHS  . heparin  5,000 Units Subcutaneous 3 times per day  . mupirocin ointment      . sodium chloride  3 mL Intravenous Q12H  . sodium hypochlorite   Irrigation To OR  . valACYclovir  1,000 mg Oral QHS   Continuous: . dextrose Stopped (02/03/15 0700)  . insulin (NOVOLIN-R) infusion     ZDG:LOVFIEPPIRJ, HYDROmorphone (DILAUDID) injection, meperidine (DEMEROL) injection, ondansetron (ZOFRAN) IV  Assessment/Plan:  Principal Problem:   DKA (diabetic ketoacidoses) Active Problems:   Hypothyroidism   MYOCARDIAL INFARCTION, HX OF   End stage renal disease 2/2 to DM ty 1-Dialysis started ~2009--Dialyses t/Th/Sat-[place]   S/P CABG (coronary artery bypass graft)   HTN (hypertension)   Panic attacks   Organic sexual dysfunction   Cellulitis of groin   Sepsis affecting skin   DKA, type 1   PenoScrotal Abscess with Sepsis (with other GU abnormalities noted on CT) Patient is s/p I&D in the OR. Cultures pending. Gram stain did show Gram neg cocci. Patient is on Cefepime and Diflucan. Lactic acid level was improving. Will repeat. He did have some yellowish exudate from his glands. Patient had been seen recently by his urologist. However, his penis did look very suspicious for being infectious. Urology was consulted. A CT scan was done which indeed shows abscesses.   Acute Encephalopathy/Agitation Likely secondary to sepsis and DKA.  Patient was quite agitated overnight. Will get Stat CT head and get an ABG as well. Further management based on these tests. Also check TSh and FT4.  Severe DKA (diabetic ketoacidoses) ABGs revealed a pH of 7.1. Patient was initiated on intravenous insulin. He was changed over to SQ insulin overnight. His BMET continues to show elevated  AG. Will need IV insulin for now. D5w when CBG less than 250. Will not transition to SQ insulin till he remains septic. Monitor BMET on a regular basis. DKA is likely precipitated by penile abscess. HbA1c was 15.4 in February.   Abnormal appearing colon including cecum on CT scan This will need further evaluation once he is more stable from the above issues. There is no history of any diarrhea. We'll need to discuss with the patient regarding any history of endoscopies when he is awake.  Hypothyroidism Continue Synthroid.  Coronary artery disease status post CABG Stable. Continue aspirin.   End stage renal disease 2/2 to DM 1 on dialysis on Tuesday, Thursday, Saturday Unclear reason why patient on Lasix 80 mg twice a day given end-stage renal disease. Holding for now. Nephrology has been consulted. He'll be dialyzed today.  Accelerated Hypertension BP remains very high likely because his meds are held. Hydralazine PRN. On clonidine patch. Unable to take meds orally. BB Q6h.   History of Panic attacks He is on multiple psychotropic agents at home. These are being held due to his alteration in mental status.  Possible shingles involving scalp Patient was started on Valtrex. Discussed with Dr. Arlean Hopping. This med can cause confusion. Since we don't have a clear reason for it, will stop for now.   DVT Prophylaxis: Heparin    Code Status: Full code  Family Communication: No family at bedside currently.  Disposition Plan: Will remain in stepdown.    LOS: 1 day   [facility]  [facility] Pager [phone] 02/03/2015, 10:36 AM  If 7PM-7AM, please  contact night-coverage at [email or web address], password [facility]

## 2015-02-03 NOTE — Addendum Note (Signed)
Addendum  created 02/03/15 1043 by Carmela Rima, CRNA   Modules edited: Anesthesia Blocks and Procedures, Clinical Notes   Clinical Notes:  File: [id number]

## 2015-02-03 NOTE — Progress Notes (Signed)
Utilization review completed.  

## 2015-02-03 NOTE — Progress Notes (Addendum)
Patient moving all over bed, pulling at lines and oxygen tubing. Patient repeatedly stating "help me". When asked if patient is in pain, he states "no". Otherwise, he only saying "help me". Attempted to reorient patient and provide emotional support, all attempts unsuccessful. Notified Dr. Rito Ehrlich, new orders for Haldol IV 2.5 mg. Will continue to monitor.

## 2015-02-03 NOTE — Progress Notes (Signed)
Subjective:  1 - Impotent with Penile Prosthesis Explantation and Chronic Penoscrotal Wound - s/p IPP implant 2015 by Patsi Sears then explant for clinical infection (CX at explant with few GPC's on gram stain CX negative) and even repeat I+D 12/2014 also sterile by [facility]. CT 12/2014 with complete explantation of all implant hardware. CT 01/2015 with new gas-containing fluid collection left inguinal / penile base area worrisome for loculated abscess.   2 - Recurrent Pyuria, Funguria - pt ESRD with minimal UOP. Diabetic on insulin. Recurrent funguria, which at baseline pt supposed to be irrigated bladder QD. Sig Funguira noted on labs this admission. Imaging 2016 w/o stones, hydro.   3 - End-Stage Renal Disease - ESRD on IHD via UE fistula x years.  Today " Thayer Ohm" is seen to proceed with operative I+D, Cysto, Drain placement for his chronic penoscrotal wound. His Cr and overall metabolic status are improved after IV insulin and dialysis yeterday / overnight. He is still quite disoriented. Family updated.   Objective: Vital signs in last 24 hours: Temp:  [97 F (36.1 C)-98.8 F (37.1 C)] 97.3 F (36.3 C) (03/27 0422) Pulse Rate:  [95-113] 96 (03/27 0422) Resp:  [15-31] 18 (03/27 0422) BP: (118-222)/(52-107) 118/52 mmHg (03/27 0422) SpO2:  [97 %-100 %] 98 % (03/27 0422) Weight:  [75.1 kg (165 lb 9.1 oz)-83.915 kg (185 lb)] 75.1 kg (165 lb 9.1 oz) (03/27 0422) Last BM Date: 02/02/15  Intake/Output from previous day: 03/26 0701 - 03/27 0700 In: 1466.7 [P.O.:60; I.V.:1406.7] Out: 2350 [Urine:450] Intake/Output this shift: Total I/O In: 614.6 [P.O.:60; I.V.:554.6] Out: 300 [Urine:300]  General appearance: alert, delirious, no distress and slowed mentation Head: Normocephalic, without obvious abnormality, atraumatic Eyes: negative Neck: supple, symmetrical, trachea midline Back: symmetric, no curvature. ROM normal. No CVA tenderness. Resp: non-labored Cardio: regular tachycardia  (improved) GI: soft, non-tender; bowel sounds normal; no masses,  no organomegaly Male genitalia: foley in place. Expressabel purulence per urethra and from small opening along penile shafe. Left penis base / inguinal induration and focal fluctence. Extremities: extremities normal, atraumatic, no cyanosis or edema and in mittents Lymph nodes: Cervical, supraclavicular, and axillary nodes normal. Neurologic: Mental status: AOx1, decreased arousability.   Lab Results:   Recent Labs  02/02/15 0509 02/03/15 0323  WBC 14.8* 7.8  HGB 10.5* 10.1*  HCT 34.7* 31.6*  PLT 210 136*   BMET  Recent Labs  02/02/15 1604 02/03/15 0323  NA 136 132*  K 3.6 3.3*  CL 97 94*  CO2 25 17*  GLUCOSE 125* 284*  BUN 15 19  CREATININE 3.11* 3.78*  CALCIUM 8.5 8.7   PT/INR  Recent Labs  02/02/15 0509  LABPROT 19.2*  INR 1.60*   ABG  Recent Labs  02/02/15 0411  PHART 7.119*  HCO3 5.0*    Studies/Results: Ct Pelvis W/o & W Cm  02/02/2015   CLINICAL DATA:  Status post penile prosthesis placement and removal, with underlying chronic wound. Assess for pelvic or inguinal abscess.  EXAM: CT PELVIS WITH AND WITHOUT CONTRAST  TECHNIQUE: Multidetector CT imaging of the pelvis was performed following the standard protocol before and following the bolus administration of intravenous contrast.  CONTRAST:  OMNIPAQUE IOHEXOL 300 MG/ML  SOLN  COMPARISON:  CT of the abdomen and pelvis from 12/19/2014  FINDINGS: There is a focal 6.2 x 4.0 x 4.0 cm collection of fluid and air at the inferior left inguinal region directly adjacent to the left side of the penile shaft, anterior to the left spermatic cord,  with minimal peripheral enhancement on contrast enhanced images, compatible with an abscess.  In addition, there is a collection of fluid and trace air extending along the left corpora cavernosum within the distal penis, measuring approximately 4.6 x 2.2 x 1.6 cm, with minimal peripheral enhancement. There  may be minimal communication between these two collections, suggested on noncontrast images, though not definitely seen on contrast-enhanced images.  Surrounding soft tissue inflammation is seen. Edema is noted about the scrotum. 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 Mild soft tissue edema is noted extending superiorly, with mild postoperative soft tissue edema at the right inguinal region, and mild soft tissue edema along both flanks.  Minimal soft tissue air at the anterior right lower quadrant abdominal wall is thought to reflect an injection site.  A small to moderate amount of borderline complex fluid is noted within the lower abdomen and pelvis. There is somewhat unusual wall thickening at the cecum, distal descending colon and distal sigmoid colon. This could reflect underlying mild colitis, though a cecal mass cannot be entirely excluded. Would correlate for evidence of colitis, and after resolution of the acute process, would consider colonoscopy to assess the ascending colon and cecum.  There is decompression of the bladder, with a Foley catheter in place. Diffuse bladder wall thickening may reflect decompression, though would correlate for evidence of cystitis. Minimal vascular calcifications are seen. Visualized inguinal nodes are normal in size. The prostate is borderline normal in size.  No acute osseous abnormalities are seen.  IMPRESSION: 1. 6.2 x 4.0 x 4.7 cm evolving abscess noted at the inferior left inguinal region, directly adjacent to the left side of penile shaft and anterior to the left spermatic cord, containing fluid and air. 2. 4.6 x 2.2 x 1.6 cm likely evolving abscess noted extending along the left corpora cavernosum within the distal penis, containing fluid and trace air. There may be minimal communication between these two fluid collections, though this is not well seen. 3. Surrounding soft tissue inflammation and edema seen about the scrotum. 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  Mild soft tissue edema extends superiorly, with mild postoperative soft tissue edema at the right inguinal region, and mild soft tissue edema along both flanks. 4. Somewhat unusual wall thickening at the cecum, distal descending colon and distal sigmoid colon. This could reflect underlying acute mild infectious or inflammatory colitis. Would correlate clinically for associated findings. A cecal mass cannot be entirely excluded. After resolution of the acute process, would consider colonoscopy to assess the ascending colon and cecum. 5. Small to moderate amount of borderline complex fluid noted within the lower abdomen and pelvis. 6. Bladder decompressed and not well assessed; diffuse bladder wall thickening likely reflects decompression, though would correlate to exclude cystitis.  These results were called by telephone at the time of interpretation on 02/02/2015 at 12:10 pm to Dr. [name], who verbally acknowledged these results.   Electronically Signed   By: Roanna Raider M.D.   On: 02/02/2015 12:11    Anti-infectives: Anti-infectives    Start     Dose/Rate Route Frequency Ordered Stop   02/05/15 1200  ceFEPIme (MAXIPIME) 2 g in dextrose 5 % 50 mL IVPB  Status:  Discontinued     2 g 100 mL/hr over 30 Minutes Intravenous Every T-Th-Sa (Hemodialysis) 02/02/15 0858 02/02/15 1432   02/02/15 2200  valACYclovir (VALTREX) tablet 1,000 mg     1,000 mg Oral Daily at bedtime 02/02/15 0858     02/02/15 2200  fluconazole (DIFLUCAN) tablet 200 mg     200 mg Oral Daily at bedtime 02/02/15 0858     02/02/15 1600  ceFEPIme (MAXIPIME) 2 g in dextrose 5 % 50 mL IVPB     2 g 100 mL/hr over 30 Minutes Intravenous Every T-Th-Sa (Hemodialysis) 02/02/15 1432     02/02/15 1000  fluconazole (DIFLUCAN) tablet 100 mg  Status:  Discontinued     100 mg Oral Daily 02/02/15 0802 02/02/15 0858   02/02/15 1000  valACYclovir (VALTREX) tablet 1,000 mg  Status:  Discontinued     1,000 mg Oral 2 times daily 02/02/15 0802  02/02/15 0858   02/02/15 0900  ceFEPIme (MAXIPIME) 2 g in dextrose 5 % 50 mL IVPB     2 g 100 mL/hr over 30 Minutes Intravenous  Once 02/02/15 0858 02/02/15 1021   02/02/15 0430  acyclovir (ZOVIRAX) 800 mg in dextrose 5 % 150 mL IVPB     800 mg 166 mL/hr over 60 Minutes Intravenous  Once 02/02/15 0422 02/02/15 0655      Assessment/Plan:  1 - Impotent with Penile Prosthesis Explantation and Chronic Penoscrotal Wound - OR today for debridement / drain placement. He will likely require HH wound care or even SNF at DC to help deal with this. Risks including bleeding, infection, damage to penis / genital skin, need for staged procedures, non-cure, need for chronic wound care as well as rare risks such as DVT, PE, MI, CVA discussed with pt and family (brother).   Agree with current ABX and antifungals peniding CX data.  2 - Recurrent Pyuria, Funguria - likely sequelae of above and diabetes.   3 - End-Stage Renal Disease - greatly appreciate nephrology help while in house.   [facility], Cataldo Cosgriff 02/03/2015

## 2015-02-03 NOTE — Progress Notes (Signed)
Pt still pulling at things and trying to get out of bed. Very restless, QT .47.   Reposition pt and adjusted temp and lighting  in room.  Placed mittens on pt.  Notified Md. New orders received.

## 2015-02-03 NOTE — Progress Notes (Signed)
Pt wanting to get up. Thrashing around in bed.  Moaning, reaching for penis area.  When pt asked if hurting pt stated "yes"  No prn pain medication ordered notified Md.  bp 191/79.  Will continue to monitor. Karena Addison T

## 2015-02-03 NOTE — Progress Notes (Signed)
pcr surgical screen obtained and pending.  Had previous staff infection in 2013.  Gave bacterban nasal swab.    cbg = 303. bp 111/48 hr 96 2l [place] 96% rr16.  Pt resting with mittens on, sitter at bedside. Will continue to monitor. Karena Addison T

## 2015-02-03 NOTE — Progress Notes (Signed)
  [facility] Progress Note   Subjective: post-op drainage of penile/ scrotal abcess this am.  sedated  Filed Vitals:   02/03/15 1000 02/03/15 1005 02/03/15 1015 02/03/15 1034  BP:  183/78 149/83 198/81  Pulse:      Temp:    97.9 F (36.6 C)  TempSrc:    Oral  Resp:      Height:      Weight:      SpO2: 100%      Exam: Sedated , not responding now, not in distress No jvd Chest clear bilat RRR no MRG Abd soft, NTND, no mass or ascites GU large dressing over penis/ scrotum, drain and foley in place; urine draining less murky 2+ LE edema pretib bilat Neuro is unresponsive  HD: TTS [facility] 3h  80kg   Heparin none   L arm AVF       Assessment: 1. Penile/ scrotal abcess, s/p I&D this am; on Maxipime. Would stop valcyclovir as he doesn't have a zoster rash and it can cause neuro side effects in ESRD pts 2. ESRD on HD 3. DKA / DM 1 - on insulin IV 4. Hypokalemia - IV KCl 5. HTN on catapres patch 6. Volume -  +vol excess w LE edema 7. PAD hx toe amps and TMA 8. Metabolic acidosis - AG down from 35 to 21.  Prob close to baseline. Had HD yesterday 9. Recent fungal pyocystis / chronic indwelling foley cath assoc UTI (Feb '16 ) - on empiric diflucan until cx's back 10. Hx penile abcess complicating penile implant placement; implant removed Oct '15  Plan - plan extra HD Monday for AMS/ edema / Kathlee Nations MD  pager [phone]    cell [phone]  02/03/2015, 10:45 AM     Recent Labs Lab 02/02/15 1150 02/02/15 1604 02/03/15 0323 02/03/15 0751  NA 132* 136 132* 135  K 3.3* 3.6 3.3* 3.3*  CL 95* 97 94*  --   CO2 17* 25 17*  --   GLUCOSE 358* 125* 284* 301*  BUN 38* 15 19  --   CREATININE 5.77* 3.11* 3.78*  --   CALCIUM 8.4 8.5 8.7  --     Recent Labs Lab 02/02/15 0218 02/02/15 0509  AST 17 24  ALT 10 8  ALKPHOS 282* 244*  BILITOT 2.3* 3.2*  PROT 7.6 7.0  ALBUMIN 3.2* 2.8*    Recent Labs Lab 02/02/15 0218 02/02/15 0509  02/03/15 0323 02/03/15 0751  WBC 14.0* 14.8* 7.8  --   NEUTROABS 12.8*  --   --   --   HGB 11.2* 10.5* 10.1* 11.9*  HCT 37.1* 34.7* 31.6* 35.0*  MCV 94.4 94.0 89.0  --   PLT 238 210 136*  --    . aspirin  81 mg Oral q morning - 10a  . ceFEPime (MAXIPIME) IV  2 g Intravenous Q T,Th,Sa-HD  . cloNIDine  0.1 mg Transdermal Weekly  . fluconazole  200 mg Oral QHS  . heparin  5,000 Units Subcutaneous 3 times per day  . mupirocin ointment      . sodium chloride  3 mL Intravenous Q12H  . sodium hypochlorite   Irrigation To OR  . valACYclovir  1,000 mg Oral QHS   . dextrose Stopped (02/03/15 0700)  . insulin (NOVOLIN-R) infusion     hydrALAZINE, HYDROmorphone (DILAUDID) injection, meperidine (DEMEROL) injection, ondansetron (ZOFRAN) IV

## 2015-02-03 NOTE — Progress Notes (Addendum)
Pt pulling at lines, equipment and gown.  Reoriented pt.  No help.  Notified md. New orders received.  Will continue to monitor. Karena Addison T

## 2015-02-03 NOTE — Brief Op Note (Signed)
02/02/2015 - 02/03/2015  8:33 AM  PATIENT:  Taylor [name]  44 y.o. male  PRE-OPERATIVE DIAGNOSIS:  SCROTAL/PENILE ABSCESS  POST-OPERATIVE DIAGNOSIS:  SCROTAL/PENILE ABSCESS  PROCEDURE:  Procedure(s): IRRIGATION AND DEBRIDEMENT SCROTAL/PENILE ABSCESS (N/A) CYSTOSCOPY (N/A)  SURGEON:  Surgeon(s) and Role:    * [name], MD - Primary  PHYSICIAN ASSISTANT:   ASSISTANTS: none   ANESTHESIA:   general  EBL:  Total I/O In: 350 [I.V.:350] Out: 200 [Urine:200]  BLOOD ADMINISTERED:none  DRAINS: 1 - foley to gravity, 2 - penrose and packing to wound drainage   LOCAL MEDICATIONS USED:  NONE  SPECIMEN:  Source of Specimen:  scrotal / penile abscess for culture  DISPOSITION OF SPECIMEN:  microbiology  COUNTS:  YES  TOURNIQUET:  * No tourniquets in log *  DICTATION: .Other Dictation: Dictation Number [id number]  PLAN OF CARE: Admit to inpatient   PATIENT DISPOSITION:  PACU - hemodynamically stable.   Delay start of Pharmacological VTE agent (>24hrs) due to surgical blood loss or risk of bleeding: yes

## 2015-02-03 NOTE — Op Note (Signed)
NAMEMYCHEAL, VELDHUIZEN NO.:  [id number]  MEDICAL RECORD NO.:  [id number]  LOCATION:  2H17C                        FACILITY:  [facility]  PHYSICIAN:  [name], MD     DATE OF BIRTH:  [birth year]  DATE OF PROCEDURE: 02/03/2015                               OPERATIVE REPORT   DIAGNOSES:  Diabetic ketoacidosis, sepsis, complex left penoscrotal inguinal abscess.  PROCEDURE: 1. Cystoscopy. 2. Incision and drainage of complex penoscrotal abscess.  ESTIMATED BLOOD LOSS:  20 mL.  COMPLICATIONS:  None.  SPECIMEN:  Abscess fluid for Gram stain and culture, aerobic and anaerobic.  FINDINGS: 1. Unremarkable urethra and urinary bladder. 2. Complex penoscrotal abscess, mostly centered in the left penile     base and scrotal area with some tracking along the subcutaneous     tissue of the shaft. 3. Complete resolution of all expressible abscess fluid following     incision and drainage. 4. Placement of a Penrose drain through and through the abscess,     tunneled along the left penile shaft skin, exiting close to the     glans penis, not penetrating the corpora.  INDICATION:  Taylor [name] is an unfortunate 45 year old gentleman with history of extensive vascular disease, end-stage renal disease, on dialysis and impotence.  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 He has had problematic chronic wound since that time.  He is status post incision and drainage previously and had been doing better; however, he presented with diabetic ketoacidosis and clinical picture of sepsis to the emergency room yesterday morning early, where he was noted to have significant induration, expressible purulence from around this penis. CT of the pelvis confirmed a complex penis scrotal abscess.  No residual penile prosthesis hardware, no tracking into the peritoneal cavity or the pelvis and significant DKA at the  time; as such, he was admitted to the ICU and underwent urgent dialysis, intravenous insulin with plan for operative debridement today.  Informed consent was signed and placed in medical record via the patient's family given his poor mental status.  PROCEDURE IN DETAIL:  The patient being Taylor [name] and procedure being a penoscrotal debridement and cystotomy was confirmed, procedure was carried out.  Time-out was performed.  Intravenous antibiotics were administered.  General anesthesia was introduced.  The patient was placed into a supine position.  Sterile field was created by prepping and draping the patient's penis, perineum, proximal thighs, infraumbilical abdomen using iodine.  He had a Foley catheter in situ that was removed.  Next, cystourethroscopy was performed using a 16- French flexible cystoscope.  Inspection of the anterior-posterior urethra unremarkable.  Inspection of bladder revealed some cloudy urine. There were no masses.  There was no obvious violation of the pendulous urethra.  A new 18-French catheter was placed per urethra to straight drain 10 mL of water in the balloon.  Next, exam under anesthesia, the area of abscess again corroborated.  The center of this appeared to be the left penile base scrotal area.  Incision was made directly onto this and approximately 150 mL of very purulent foul fluid was immediately evacuated.  A sample of this was set aside for Gram  stain and culture, aerobic and anaerobic.  The area suctioned and irrigated several times. This did track along the subcutaneous tissue along with penile shaft, also consistent with a prior imaging.  There was no palpable involvement of the corpora.  It was felt that repeat corporotomy would not be necessary.  A Penrose drain was then placed from this penoscrotal area tracking under the penile subcutaneous skin, and a small counter incision was made in the distal shaft skin where the other end of  the Penrose drain was placed.  This was anchored with nylon x2.  The abscess cavity was then packed with Dakin soaked gauze, and a large gauze dressing was placed over the entire operative site.  Following these maneuvers, the hemostasis was noted to be excellent.  There was complete resolution of all fluctuant material.  We had achieved the goals of the surgery today.  The procedure was terminated.  The patient tolerated the procedure well.  There were no immediate periprocedural complications. The patient was taken to the postanesthesia care unit in stable condition with continuing ICU care.          ______________________________ [name], MD     TM/MEDQ  D:  02/03/2015  T:  02/03/2015  Job:  [id number]

## 2015-02-03 NOTE — Anesthesia Preprocedure Evaluation (Addendum)
Anesthesia Evaluation  Patient identified by MRN, date of birth, ID band Patient unresponsive    Reviewed: Allergy & Precautions, H&P , NPO status , Patient's Chart, lab work & pertinent test results, reviewed documented beta blocker date and time , Unable to perform ROS - Chart review only  Airway Mallampati: II  TM Distance: >3 FB Neck ROM: Full    Dental  (+) Poor Dentition, Dental Advidsory Given   Pulmonary          Cardiovascular hypertension, Pt. on medications + angina + CAD, + Past MI, + Peripheral Vascular Disease and +CHF Rhythm:regular  ECHO 6/15  - Left ventricle: The cavity size was normal. There was mild concentric hypertrophy. Systolic function was normal. The estimated ejection fraction was in the range of 50% to 55%. Wall motion was normal; there were no regional wall motion abnormalities. - Aortic valve: Select images concerning for Bicuspid aortic valve. normal thickness leaflets. - Mitral valve: There was mild regurgitation. - Left atrium: The atrium was mildly dilated. - Right ventricle: The cavity size was mildly dilated. Wall thickness was normal. - Right atrium: The atrium was mildly dilated. - Tricuspid valve: There was moderate regurgitation. On select image, regurgitation appears moderate to severe. - Pulmonary arteries: Systolic pressure was at least moderately elevated. RVSP estimated at 52 mm Hg. Parasternal long axis image with estimate of 85 mm Hg.    Neuro/Psych  Neuromuscular disease    GI/Hepatic hiatal hernia, GERD-  Medicated and Controlled,  Endo/Other  diabetes, Poorly Controlled, Type 1Hypothyroidism   Renal/GU ESRF and DialysisRenal disease     Musculoskeletal   Abdominal   Peds  Hematology  (+) anemia ,   Anesthesia Other Findings   Reproductive/Obstetrics                           Anesthesia Physical Anesthesia Plan  ASA:  III  Anesthesia Plan: General   Post-op Pain Management:    Induction: Intravenous  Airway Management Planned: LMA  Additional Equipment:   Intra-op Plan:   Post-operative Plan: Extubation in OR  Informed Consent: I have reviewed the patients History and Physical, chart, labs and discussed the procedure including the risks, benefits and alternatives for the proposed anesthesia with the patient or authorized representative who has indicated his/her understanding and acceptance.   Dental Advisory Given  Plan Discussed with: CRNA and Surgeon  Anesthesia Plan Comments:        Anesthesia Quick Evaluation

## 2015-02-03 NOTE — Anesthesia Postprocedure Evaluation (Signed)
Anesthesia Post Note  Patient: Taylor [name]  Procedure(s) Performed: Procedure(s) (LRB): IRRIGATION AND DEBRIDEMENT SCROTAL/PENILE ABSCESS (N/A) CYSTOSCOPY (N/A)  Anesthesia type: general  Patient location: PACU  Post pain: Pain level controlled  Post assessment: Patient's Cardiovascular Status Stable  Last Vitals:  Filed Vitals:   02/03/15 0920  BP: 159/78  Pulse: 93  Temp:   Resp: 22    Post vital signs: Reviewed and stable  Level of consciousness: sedated  Complications: No apparent anesthesia complications

## 2015-02-03 NOTE — Progress Notes (Signed)
Patient still agitated after Haldol IV. Dr. Rito Ehrlich aware, new orders for morphine PRN. Brother Tim, and Recruitment consultant in room, will continue to monitor.

## 2015-02-03 NOTE — Progress Notes (Signed)
Pt thrashing around in bed. Pulling at lines and equipment.  Tried to reorient pt.  Md notified. New orders received.  Will continue to monitor. Karena Addison T

## 2015-02-03 NOTE — Progress Notes (Signed)
Patient arrived on unit from PACU, currently still sedated and unresponsive. HR 95, BP 183/80, RR 23, O2 sats 100% on 2 liters nasal canula. Scrotal dressing intact, with minimal sanguineous drainage. No family at bedside. Will continue to monitor closely.

## 2015-02-03 NOTE — Transfer of Care (Signed)
Immediate Anesthesia Transfer of Care Note  Patient: Taylor [name]  Procedure(s) Performed: Procedure(s): IRRIGATION AND DEBRIDEMENT SCROTAL/PENILE ABSCESS (N/A) CYSTOSCOPY (N/A)  Patient Location: PACU  Anesthesia Type:General  Level of Consciousness: sedated  Airway & Oxygen Therapy: Patient Spontanous Breathing and Patient connected to face mask oxygen  Post-op Assessment: Report given to RN, Post -op Vital signs reviewed and stable and Patient moving all extremities X 4  Post vital signs: Reviewed and stable  Last Vitals:  Filed Vitals:   02/03/15 0422  BP: 118/52  Pulse: 96  Temp: 36.3 C  Resp: 18    Complications: No apparent anesthesia complications

## 2015-02-03 NOTE — Anesthesia Procedure Notes (Signed)
Procedure Name: Intubation Date/Time: 02/03/2015 7:52 AM Performed by: Carmela Rima Pre-anesthesia Checklist: Patient being monitored, Suction available, Emergency Drugs available, Patient identified and Timeout performed Patient Re-evaluated:Patient Re-evaluated prior to inductionOxygen Delivery Method: Circle system utilized Preoxygenation: Pre-oxygenation with 100% oxygen Intubation Type: IV induction and Rapid sequence Ventilation: Mask ventilation without difficulty Laryngoscope Size: Mac and 3 Grade View: Grade I Tube type: Oral Tube size: 7.5 mm Number of attempts: 1 Placement Confirmation: positive ETCO2,  ETT inserted through vocal cords under direct vision and breath sounds checked- equal and bilateral Secured at: 22 cm Tube secured with: Tape Dental Injury: Teeth and Oropharynx as per pre-operative assessment

## 2015-02-04 ENCOUNTER — Encounter (HOSPITAL_COMMUNITY): Payer: Self-pay | Admitting: Urology

## 2015-02-04 DIAGNOSIS — N492 Inflammatory disorders of scrotum: Secondary | ICD-10-CM

## 2015-02-04 DIAGNOSIS — I10 Essential (primary) hypertension: Secondary | ICD-10-CM | POA: Diagnosis present

## 2015-02-04 DIAGNOSIS — E1029 Type 1 diabetes mellitus with other diabetic kidney complication: Secondary | ICD-10-CM

## 2015-02-04 DIAGNOSIS — R410 Disorientation, unspecified: Secondary | ICD-10-CM

## 2015-02-04 DIAGNOSIS — A419 Sepsis, unspecified organism: Secondary | ICD-10-CM

## 2015-02-04 LAB — GLUCOSE, CAPILLARY
GLUCOSE-CAPILLARY: 128 mg/dL — AB (ref 70–99)
GLUCOSE-CAPILLARY: 92 mg/dL (ref 70–99)
GLUCOSE-CAPILLARY: 160 mg/dL — AB (ref 70–99)
GLUCOSE-CAPILLARY: 164 mg/dL — AB (ref 70–99)
GLUCOSE-CAPILLARY: 147 mg/dL — AB (ref 70–99)
GLUCOSE-CAPILLARY: 200 mg/dL — AB (ref 70–99)
GLUCOSE-CAPILLARY: 212 mg/dL — AB (ref 70–99)
GLUCOSE-CAPILLARY: 55 mg/dL — AB (ref 70–99)
GLUCOSE-CAPILLARY: 91 mg/dL (ref 70–99)
GLUCOSE-CAPILLARY: 95 mg/dL (ref 70–99)
GLUCOSE-CAPILLARY: 105 mg/dL — AB (ref 70–99)
GLUCOSE-CAPILLARY: 107 mg/dL — AB (ref 70–99)
GLUCOSE-CAPILLARY: 82 mg/dL (ref 70–99)
Glucose-Capillary: 116 mg/dL — ABNORMAL HIGH (ref 70–99)
Glucose-Capillary: 129 mg/dL — ABNORMAL HIGH (ref 70–99)
Glucose-Capillary: 132 mg/dL — ABNORMAL HIGH (ref 70–99)
Glucose-Capillary: 140 mg/dL — ABNORMAL HIGH (ref 70–99)
Glucose-Capillary: 201 mg/dL — ABNORMAL HIGH (ref 70–99)
Glucose-Capillary: 233 mg/dL — ABNORMAL HIGH (ref 70–99)
Glucose-Capillary: 52 mg/dL — ABNORMAL LOW (ref 70–99)
Glucose-Capillary: 80 mg/dL (ref 70–99)
Glucose-Capillary: 87 mg/dL (ref 70–99)
Glucose-Capillary: 90 mg/dL (ref 70–99)
Glucose-Capillary: 94 mg/dL (ref 70–99)
Glucose-Capillary: 98 mg/dL (ref 70–99)

## 2015-02-04 LAB — CBC
HEMATOCRIT: 36.5 % — AB (ref 39.0–52.0)
HEMOGLOBIN: 11.5 g/dL — AB (ref 13.0–17.0)
MCH: 28.5 pg (ref 26.0–34.0)
MCHC: 31.5 g/dL (ref 30.0–36.0)
MCV: 90.3 fL (ref 78.0–100.0)
Platelets: 132 10*3/uL — ABNORMAL LOW (ref 150–400)
RBC: 4.04 MIL/uL — ABNORMAL LOW (ref 4.22–5.81)
RDW: 16.1 % — AB (ref 11.5–15.5)
WBC: 8.7 10*3/uL (ref 4.0–10.5)

## 2015-02-04 LAB — BASIC METABOLIC PANEL
Anion gap: 15 (ref 5–15)
Anion gap: 13 (ref 5–15)
BUN: 24 mg/dL — ABNORMAL HIGH (ref 6–23)
BUN: 25 mg/dL — ABNORMAL HIGH (ref 6–23)
CALCIUM: 9 mg/dL (ref 8.4–10.5)
CHLORIDE: 95 mmol/L — AB (ref 96–112)
CO2: 23 mmol/L (ref 19–32)
CO2: 23 mmol/L (ref 19–32)
Calcium: 9.2 mg/dL (ref 8.4–10.5)
Chloride: 97 mmol/L (ref 96–112)
Creatinine, Ser: 4.73 mg/dL — ABNORMAL HIGH (ref 0.50–1.35)
Creatinine, Ser: 4.81 mg/dL — ABNORMAL HIGH (ref 0.50–1.35)
GFR calc Af Amer: 16 mL/min — ABNORMAL LOW (ref >90)
GFR calc Af Amer: 15 mL/min — ABNORMAL LOW (ref >90)
GFR calc non Af Amer: 14 mL/min — ABNORMAL LOW (ref >90)
GFR calc non Af Amer: 13 mL/min — ABNORMAL LOW (ref >90)
GLUCOSE: 134 mg/dL — AB (ref 70–99)
GLUCOSE: 109 mg/dL — AB (ref 70–99)
POTASSIUM: 3.8 mmol/L (ref 3.5–5.1)
POTASSIUM: 3.9 mmol/L (ref 3.5–5.1)
Sodium: 133 mmol/L — ABNORMAL LOW (ref 135–145)
Sodium: 133 mmol/L — ABNORMAL LOW (ref 135–145)

## 2015-02-04 LAB — URINE CULTURE: Colony Count: >=100000

## 2015-02-04 LAB — T4, FREE: Free T4: 1.08 ng/dL (ref 0.80–1.80)

## 2015-02-04 LAB — HEPATITIS B SURFACE ANTIGEN: HEP B S AG: NEGATIVE

## 2015-02-04 MED ORDER — DEXTROSE 50 % IV SOLN
25.0000 mL | Freq: Once | INTRAVENOUS | Status: AC
  Administered 2015-02-04: 25 mL via INTRAVENOUS
  Filled 2015-02-04: qty 50

## 2015-02-04 MED ORDER — LIDOCAINE HCL (PF) 1 % IJ SOLN: 5.0000 mL | INTRAMUSCULAR | Status: DC | PRN

## 2015-02-04 MED ORDER — NEPRO/CARBSTEADY PO LIQD
237.0000 mL | ORAL | Status: DC | PRN
  Filled 2015-02-04: qty 237

## 2015-02-04 MED ORDER — SODIUM CHLORIDE 0.9 % IV SOLN
INTRAVENOUS | Status: DC
  Filled 2015-02-04: qty 2.5

## 2015-02-04 MED ORDER — SODIUM CHLORIDE 0.9 % IV SOLN: 100.0000 mL | INTRAVENOUS | Status: DC | PRN

## 2015-02-04 MED ORDER — DEXTROSE 50 % IV SOLN
INTRAVENOUS | Status: AC
  Filled 2015-02-04: qty 50

## 2015-02-04 MED ORDER — INSULIN GLARGINE 100 UNIT/ML ~~LOC~~ SOLN
10.0000 [IU] | Freq: Every day | SUBCUTANEOUS | Status: DC
  Administered 2015-02-04 – 2015-02-07 (×3): 10 [IU] via SUBCUTANEOUS
  Filled 2015-02-04 (×5): qty 0.1

## 2015-02-04 MED ORDER — CEFEPIME HCL 2 G IJ SOLR
2.0000 g | Freq: Once | INTRAMUSCULAR | Status: AC
  Administered 2015-02-04: 2 g via INTRAVENOUS
  Filled 2015-02-04: qty 2

## 2015-02-04 MED ORDER — LEVOTHYROXINE SODIUM 75 MCG PO TABS
75.0000 ug | ORAL_TABLET | Freq: Every day | ORAL | Status: DC
  Administered 2015-02-04 – 2015-02-10 (×7): 75 ug via ORAL
  Filled 2015-02-04 (×10): qty 1

## 2015-02-04 MED ORDER — HALOPERIDOL LACTATE 5 MG/ML IJ SOLN
2.5000 mg | Freq: Four times a day (QID) | INTRAMUSCULAR | Status: DC | PRN
  Administered 2015-02-04 – 2015-02-06 (×4): 2.5 mg via INTRAVENOUS
  Filled 2015-02-04 (×4): qty 1

## 2015-02-04 MED ORDER — LIDOCAINE-PRILOCAINE 2.5-2.5 % EX CREA
1.0000 "application " | TOPICAL_CREAM | CUTANEOUS | Status: DC | PRN
  Filled 2015-02-04: qty 5

## 2015-02-04 MED ORDER — INSULIN ASPART 100 UNIT/ML ~~LOC~~ SOLN
0.0000 [IU] | SUBCUTANEOUS | Status: DC
  Administered 2015-02-05: 3 [IU] via SUBCUTANEOUS
  Administered 2015-02-05 (×2): 5 [IU] via SUBCUTANEOUS

## 2015-02-04 MED ORDER — LORAZEPAM 2 MG/ML IJ SOLN
INTRAMUSCULAR | Status: AC
  Filled 2015-02-04: qty 1

## 2015-02-04 MED ORDER — LORAZEPAM 2 MG/ML IJ SOLN
0.5000 mg | Freq: Once | INTRAMUSCULAR | Status: AC
  Administered 2015-02-04: 0.5 mg via INTRAVENOUS
  Filled 2015-02-04: qty 1

## 2015-02-04 MED ORDER — ALTEPLASE 2 MG IJ SOLR
2.0000 mg | Freq: Once | INTRAMUSCULAR | Status: DC | PRN
  Filled 2015-02-04: qty 2

## 2015-02-04 MED ORDER — PENTAFLUOROPROP-TETRAFLUOROETH EX AERO: 1.0000 "application " | INHALATION_SPRAY | CUTANEOUS | Status: DC | PRN

## 2015-02-04 MED ORDER — MORPHINE SULFATE 2 MG/ML IJ SOLN
INTRAMUSCULAR | Status: AC
  Filled 2015-02-04: qty 1

## 2015-02-04 MED ORDER — LORAZEPAM 2 MG/ML IJ SOLN
0.5000 mg | Freq: Four times a day (QID) | INTRAMUSCULAR | Status: DC | PRN
  Administered 2015-02-04 (×2): 0.5 mg via INTRAVENOUS
  Filled 2015-02-04: qty 1

## 2015-02-04 MED ORDER — HEPARIN SODIUM (PORCINE) 1000 UNIT/ML DIALYSIS: 1000.0000 [IU] | INTRAMUSCULAR | Status: DC | PRN

## 2015-02-04 MED ORDER — DEXTROSE 50 % IV SOLN
18.0000 mL | Freq: Once | INTRAVENOUS | Status: AC
  Administered 2015-02-04: 18 mL via INTRAVENOUS

## 2015-02-04 NOTE — Progress Notes (Signed)
[facility] PROGRESS NOTE  JAIQUAN TEMME NWG:[id number] DOB: [birth year] DOA: 02/02/2015  PCP: [name], MD  Brief HPI: 44 year old caucasian male with a past medical history of end-stage renal disease on hemodialysis on Tuesday, Thursday, Saturdays, type one diabetes with diabetic neuropathy, history of coronary artery disease status post CABG, history of penile prosthesis, status post infection with removal of hardware, who presented with a three-day history of nausea and vomiting. He was noted to have significantly elevated blood glucose with evidence for diabetic ketoacidosis. There was also suspicion of infection in his penis. Patient was admitted to the hospital for further management. Patient noted to have penoscrotal abscess. He underwent drainage in the OR.  Past medical history:  Past Medical History  Diagnosis Date  . End stage renal disease on dialysis     LUE fistula  . Type I diabetes mellitus     a. 03/2014 admitted with HNK to [facility].  . Diabetic neuropathy     severe, s/p multiple toe amputation  . Hypothyroidism   . Hypertension   . Hyperlipidemia   . H/O hiatal hernia   . GERD (gastroesophageal reflux disease)   . Anxiety   . Sebaceous cyst     side of neck  . Pneumonia     2010  . Anemia     a. req PRBC's 2011.  [name] PAD (peripheral artery disease)     a. s/p amputation of toes on the right;  b. left LE claudication.  . Coronary artery disease     a. s/p MI;  b. 10/2009 CABG x 3 @ [facility]: LIMA->LAD, VG->OM3, VG->RPDA; c. 11/2010 Cath 3/3 patent grafts;  d 12/2012 Cath: LM 30d, LAD 85p, D1 70, D2 90, LCX 40ost, OM2 100, RCA 90p, 180m, L->LAD ok, VG->OM3 ok, VG->RPDA 30, EF 50%->Med Rx.  . Cataract     right  . Valvular disease     a. 11/2012 Echo: EF 55-60%, mild LVH, mild MR, mild bi-atrial enlargement, mild-mod TR, PASP .  . CHF (congestive heart failure)   . Depression   . Arthritis     rheumatoid arthritis   . Hx of transfusion of packed  red blood cells   . Myocardial infarction 2010  . Anginal pain     Consultants: Nephrology and urology  Procedures:  Hemodialysis TTS  I&D of Penoscrotal Abscess 3/27  Antibiotics: Cefepime 3/26 Diflucan 3/26 Valtrex 3/26. Stopped on 3/27  Subjective: Patient remains agitated and confused. Unable to get information from him.  Objective: Vital Signs  Filed Vitals:   02/03/15 2005 02/03/15 2313 02/04/15 0300 02/04/15 0400  BP: 171/93 163/80  130/88  Pulse: 94 90 94 95  Temp: 97.7 F (36.5 C) 96.3 F (35.7 C)  96.2 F (35.7 C)  TempSrc: Oral Axillary  Axillary  Resp: 23 18  16   Height:      Weight:      SpO2: 100% 100% 100% 100%    Intake/Output Summary (Last 24 hours) at 02/04/15 0734 Last data filed at 02/04/15 0600  Gross per 24 hour  Intake 1312.68 ml  Output    475 ml  Net 837.68 ml    General appearance: Patient remains agitated. Resp: clear to auscultation bilaterally with reduced air entry at the bases. No crackles or wheezing. Cardio: regular rate and rhythm, S1, S2 normal, no murmur, click, rub or gallop GI: soft, non-tender; bowel sounds normal; no masses,  no organomegaly GU: Scrotal area and Penis covered in dressing. Foley noted.  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 Neurologic: Agitated. Moving all his extremities. Appears to be delirious. No focal weakness on examination.   Lab Results:  Basic Metabolic Panel:  Recent Labs Lab 02/03/15 1126 02/03/15 1627 02/03/15 2005 02/04/15 0116 02/04/15 0620  NA 135 136 133* 133* 133*  K 3.1* 3.7 4.5 3.8 3.9  CL 96 99 96 95* 97  CO2 GLUCOSE 232* 145* 166* 134* 109*  BUN 24* 25*  CREATININE 4.04* 4.26* 4.40* 4.73* 4.81*  CALCIUM 9.2 9.1 8.9 9.2 9.0   Liver Function Tests:  Recent Labs Lab 02/02/15 0218 02/02/15 0509  AST 17 24  ALT 10 8  ALKPHOS 282* 244*  BILITOT 2.3* 3.2*  PROT 7.6 7.0  ALBUMIN 3.2* 2.8*   CBC:  Recent  Labs Lab 02/02/15 0218 02/02/15 0509 02/03/15 0323 02/03/15 0751 02/04/15 0620  WBC 14.0* 14.8* 7.8  --  8.7  NEUTROABS 12.8*  --   --   --   --   HGB 11.2* 10.5* 10.1* 11.9* 11.5*  HCT 37.1* 34.7* 31.6* 35.0* 36.5*  MCV 94.4 94.0 89.0  --  90.3  PLT 238 210 136*  --  132*   CBG:  Recent Labs Lab 02/04/15 0329 02/04/15 0403 02/04/15 0503 02/04/15 0556 02/04/15 0649  GLUCAP 55* 91 87 95 94    Studies/Results: Ct Head Wo Contrast  02/03/2015   CLINICAL DATA:  Altered mental status recent pelvic surgery  EXAM: CT HEAD WITHOUT CONTRAST  TECHNIQUE: Contiguous axial images were obtained from the base of the skull through the vertex without intravenous contrast.  COMPARISON:  None.  FINDINGS: No skull fracture is noted. Mild mucosal thickening right maxillary sinus. The mastoid air cells are unremarkable. Atherosclerotic calcifications of carotid siphon.  No intracranial hemorrhage, mass effect or midline shift. No intra or extra-axial fluid collection. No acute cortical infarction. No mass lesion is noted on this unenhanced scan.  IMPRESSION: No acute intracranial abnormality. Atherosclerotic calcifications are noted bilateral carotid siphon. No definite acute cortical infarction. Mild mucosal thickening right maxillary sinus.   Electronically Signed   By: Natasha Mead M.D.   On: 02/03/2015 13:52   Ct Pelvis W/o & W Cm  02/02/2015   CLINICAL DATA:  Status post penile prosthesis placement and removal, with underlying chronic wound. Assess for pelvic or inguinal abscess.  EXAM: CT PELVIS WITH AND WITHOUT CONTRAST  TECHNIQUE: Multidetector CT imaging of the pelvis was performed following the standard protocol before and following the bolus administration of intravenous contrast.  CONTRAST:  OMNIPAQUE IOHEXOL 300 MG/ML  SOLN  COMPARISON:  CT of the abdomen and pelvis from 12/19/2014  FINDINGS: There is a focal 6.2 x 4.0 x 4.0 cm collection of fluid and air at the inferior left inguinal  region directly adjacent to the left side of the penile shaft, anterior to the left spermatic cord, with minimal peripheral enhancement on contrast enhanced images, compatible with an abscess.  In addition, there is a collection of fluid and trace air extending along the left corpora cavernosum within the distal penis, measuring approximately 4.6 x 2.2 x 1.6 cm, with minimal peripheral enhancement. There may be minimal communication between these two collections, suggested on noncontrast images, though not definitely seen on contrast-enhanced images.  Surrounding soft tissue inflammation is seen. Edema is noted about the scrotum. 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 Mild soft tissue edema is noted extending superiorly, with mild postoperative  soft tissue edema at the right inguinal region, and mild soft tissue edema along both flanks.  Minimal soft tissue air at the anterior right lower quadrant abdominal wall is thought to reflect an injection site.  A small to moderate amount of borderline complex fluid is noted within the lower abdomen and pelvis. There is somewhat unusual wall thickening at the cecum, distal descending colon and distal sigmoid colon. This could reflect underlying mild colitis, though a cecal mass cannot be entirely excluded. Would correlate for evidence of colitis, and after resolution of the acute process, would consider colonoscopy to assess the ascending colon and cecum.  There is decompression of the bladder, with a Foley catheter in place. Diffuse bladder wall thickening may reflect decompression, though would correlate for evidence of cystitis. Minimal vascular calcifications are seen. Visualized inguinal nodes are normal in size. The prostate is borderline normal in size.  No acute osseous abnormalities are seen.  IMPRESSION: 1. 6.2 x 4.0 x 4.7 cm evolving abscess noted at the inferior left inguinal region, directly adjacent to the left side of penile shaft and anterior to the left  spermatic cord, containing fluid and air. 2. 4.6 x 2.2 x 1.6 cm likely evolving abscess noted extending along the left corpora cavernosum within the distal penis, containing fluid and trace air. There may be minimal communication between these two fluid collections, though this is not well seen. 3. Surrounding soft tissue inflammation and edema seen about the scrotum. 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 Mild soft tissue edema extends superiorly, with mild postoperative soft tissue edema at the right inguinal region, and mild soft tissue edema along both flanks. 4. Somewhat unusual wall thickening at the cecum, distal descending colon and distal sigmoid colon. This could reflect underlying acute mild infectious or inflammatory colitis. Would correlate clinically for associated findings. A cecal mass cannot be entirely excluded. After resolution of the acute process, would consider colonoscopy to assess the ascending colon and cecum. 5. Small to moderate amount of borderline complex fluid noted within the lower abdomen and pelvis. 6. Bladder decompressed and not well assessed; diffuse bladder wall thickening likely reflects decompression, though would correlate to exclude cystitis.  These results were called by telephone at the time of interpretation on 02/02/2015 at 12:10 pm to Dr. [name], who verbally acknowledged these results.   Electronically Signed   By: Roanna Raider M.D.   On: 02/02/2015 12:11    Medications:  Scheduled: . aspirin  81 mg Oral q morning - 10a  . ceFEPime (MAXIPIME) IV  2 g Intravenous Q T,Th,Sa-HD  . Chlorhexidine Gluconate Cloth  6 each Topical Q0600  . cloNIDine  0.1 mg Transdermal Weekly  . fluconazole  200 mg Oral QHS  . heparin  5,000 Units Subcutaneous 3 times per day  . insulin aspart  0-15 Units Subcutaneous 6 times per day  . insulin glargine  10 Units Subcutaneous Daily  . levothyroxine  75 mcg Oral QAC breakfast  . metoprolol  5 mg Intravenous 4 times per day   . mupirocin ointment  1 application Nasal BID  . sodium chloride  3 mL Intravenous Q12H  . sodium hypochlorite   Irrigation To OR   Continuous: . dextrose 40 mL (02/03/15 2000)   BTD:VVOHYWVPXTG lactate, hydrALAZINE, LORazepam, morphine injection  Assessment/Plan:  Principal Problem:   DKA (diabetic ketoacidoses) Active Problems:   Hypothyroidism   MYOCARDIAL INFARCTION, HX OF   End stage renal disease 2/2 to DM ty 1-Dialysis started ~2009--Dialyses  t/Th/Sat-[place]   S/P CABG (coronary artery bypass graft)   HTN (hypertension)   Panic attacks   Organic sexual dysfunction   Cellulitis of groin   Sepsis affecting skin   DKA, type 1   PenoScrotal Abscess with Sepsis (with other GU abnormalities noted on CT) Patient is s/p I&D in the OR on 3/27. Cultures revealed gram-negative rods. Final identification and sensitivities are pending. Patient is on Cefepime and Diflucan. Lactic acid level was improving. At presentation he was noted to have yellowish discharge around his penis. Urology was consulted. A CT scan was done which showed abscesses.   Acute Encephalopathy/Agitation Remains quite agitated despite doses of benzodiazepine and Haldol. QT interval is normal. Agitation still likely due to sepsis. CT scan was unremarkable for any acute process. Repeat ABG looked fine as well. TSH was noted to be elevated but free T4 is normal. We will consult neurology to provide their opinion. According to the family. There is no history of alcoholism. He does not consume pain medications on a regular basis. He is on multiple psychotropic agents for history of depression.  Severe DKA (diabetic ketoacidoses) in the setting of type 1 diabetes Appears to have resolved. Initial ABG revealed a pH of 7.1. Patient was initiated on intravenous insulin. He was changed over to SQ insulin after his and iron gap closed.DKA was likely precipitated by penile abscess. HbA1c was 15.4 in February.   Abnormal  appearing colon including cecum on CT scan This will need further evaluation once he is more stable from the above issues. There is no history of any diarrhea. We'll need to discuss with the patient regarding any history of endoscopies when he is not agitated.  Hypothyroidism Continue Synthroid. TSH was elevated at 12, but free T4 is normal.  Coronary artery disease status post CABG Stable. Continue aspirin.   End stage renal disease 2/2 to DM 1 on dialysis on Tuesday, Thursday, Saturday Nephrology's following and he is being dialyzed. Unclear reason why patient was on Lasix 80 mg twice a day at home given end-stage renal disease. Holding for now.   Accelerated Hypertension BP remains very high likely because his meds are held along with the fact that he is very agitated. Hydralazine PRN. On clonidine patch. Unable to take meds orally. BB Q6h intravenously.   History of Panic attacks He is on multiple psychotropic agents at home. These are being held due to his alteration in mental status.  Possible shingles involving scalp Patient was started on Valtrex. Discussed with Dr. Arlean Hopping on 3/27. This med can cause confusion. Since we don't have a clear reason for it, will stop for now. Lesions on the scalp, not conclusive.  DVT Prophylaxis: Heparin    Code Status: Full code  Family Communication: No family at bedside currently. Discussed with his father and brother yesterday. Disposition Plan: Will remain in stepdown. His condition remains tenuous.    LOS: 2 days   [facility]  [facility] Pager [phone] 02/04/2015, 7:34 AM  If 7PM-7AM, please contact night-coverage at [email or web address], password [facility]

## 2015-02-04 NOTE — Progress Notes (Signed)
Pt received HD w/ no complications. Alert, vss, not oriented. Report called to primary Rn. Pt. Returned safely to room and CCMD notified.

## 2015-02-04 NOTE — Progress Notes (Signed)
Inpatient Diabetes Program Recommendations  AACE/ADA: New Consensus Statement on Inpatient Glycemic Control (2013)  Target Ranges:  Prepandial:   less than 140 mg/dL      Peak postprandial:   less than 180 mg/dL (1-2 hours)      Critically ill patients:  140 - 180 mg/dL     Results for Taylor [name], Taylor [name] (MRN [id number]) as of 02/04/2015 14:27  Ref. Range 02/04/2015 00:02 02/04/2015 01:02 02/04/2015 02:18 02/04/2015 03:29 02/04/2015 04:03 02/04/2015 05:03 02/04/2015 05:56 02/04/2015 06:49 02/04/2015 07:55 02/04/2015 11:47 02/04/2015 13:40  Glucose-Capillary Latest Range: 70-99 mg/dL 671 (H) 245 (H) 90 55 (L) 91 87 95 94 105 (H) 116 (H) 107 (H)    Admit with: DKA  History: DM Type 1, ESRD, HTN, CAD, CHF  Home DM Meds: Lantus 30 units daily        Novolog 7-17 units tid per SSI  Current DM Orders: Lantus 10 units daily              Novolog Moderate SSI Q4 hours    **Note patient admitted early AM on 03/26 with DKA.  Glucose was 590 mg/dl, Anion Gap 37.  Treated with IVF and IV insulin drip.    **Transitioned off IV insulin drip at ~4PM on 03/26 but was never given any basal insulin.  As a result, patient went back into DKA on 02/03/15 (Anion Gap was 21 on 3am BMET on 03/27).    **Restarted on IV insulin drip and was given Lantus 15 units at 9PM on 03/27 and was given another 10 units Lantus today at 9:30 AM.    MD- Currently patient only has orders for Lantus 10 units daily + Moderate SSI.  Takes Lantus 30 units daily at home.  Will likely need more basal insulin.  Please consider increasing Lantus to 75% of home dose- Lantus 22 units daily     Will follow Ambrose Finland RN, MSN, CDE Diabetes Coordinator Inpatient Diabetes Program Team Pager: [phone] (8a-5p)   **

## 2015-02-04 NOTE — Progress Notes (Signed)
ANTIBIOTIC CONSULT NOTE - FOLLOW UP  Pharmacy Consult for cefepime Indication: PenoScrotal Abscess   Allergies  Allergen Reactions  . Amoxicillin-Pot Clavulanate Nausea And Vomiting  . Hydromorphone Hcl     "body started down"  08/22/14 - pt states no longer has problems with this  . Rifampin Nausea And Vomiting    Patient Measurements: Height: 5\' 8"  (172.7 cm) (estimate, pt unble to state ht) Weight: 165 lb 9.1 oz (75.1 kg) IBW/kg (Calculated) : 68.4  Vital Signs: Temp: 96.3 F (35.7 C) (03/28 1150) Temp Source: Axillary (03/28 1150) BP: 197/86 mmHg (03/28 1150) Pulse Rate: 103 (03/28 1150) Intake/Output from previous day: 03/27 0701 - 03/28 0700 In: 1312.7 [P.O.:60; I.V.:1252.7] Out: 475 [Urine:475] Intake/Output from this shift: Total I/O In: 240 [I.V.:240] Out: 0   Labs:  Recent Labs  02/02/15 0509  02/03/15 0323 02/03/15 0751  02/03/15 2005 02/04/15 0116 02/04/15 0620  WBC 14.8*  --  7.8  --   --   --   --  8.7  HGB 10.5*  --  10.1* 11.9*  --   --   --  11.5*  PLT 210  --  136*  --   --   --   --  132*  CREATININE 5.94*  < > 3.78*  --   < > 4.40* 4.73* 4.81*  < > = values in this interval not displayed. Estimated Creatinine Clearance: 18.6 mL/min (by C-G formula based on Cr of 4.81). No results for input(s): VANCOTROUGH, VANCOPEAK, VANCORANDOM, GENTTROUGH, GENTPEAK, GENTRANDOM, TOBRATROUGH, TOBRAPEAK, TOBRARND, AMIKACINPEAK, AMIKACINTROU, AMIKACIN in the last 72 hours.   Microbiology: Recent Results (from the past 720 hour(s))  Urine culture     Status: None   Collection Time: 02/02/15  3:57 AM  Result Value Ref Range Status   Specimen Description URINE, RANDOM  Final   Special Requests NONE  Final   Colony Count   Final    >=100,000 COLONIES/ML Performed at [facility]    Culture   Final    Multiple bacterial morphotypes present, none predominant. Suggest appropriate recollection if clinically indicated. Performed at [facility]    Report Status 02/04/2015 FINAL  Final  Stat Gram stain     Status: None   Collection Time: 02/02/15 10:50 AM  Result Value Ref Range Status   Specimen Description PENILE DRAINAGE  Final   Special Requests Immunocompromised  Final   Gram Stain   Final    ABUNDANT WBC PRESENT,BOTH PMN AND MONONUCLEAR ABUNDANT GRAM NEGATIVE COCCI IN DIPLOS Gram Stain Report Called to,Read Back By and Verified With: A THOMPSON,RN 02/02/15 1207 M [facility]    Report Status 02/02/2015 FINAL  Final  Wound culture     Status: None (Preliminary result)   Collection Time: 02/02/15 10:50 AM  Result Value Ref Range Status   Specimen Description PENILE DRAINAGE  Final   Special Requests Immunocompromised  Final   Gram Stain   Final    ABUNDANT WBC PRESENT,BOTH PMN AND MONONUCLEAR ABUNDANT GRAM NEGATIVE COCCI Gram Stain Report Called to,Read Back By and Verified With: Gram Stain Report Called to,Read Back By and Verified With: A THOMPSON RN 02/02/15 1207 1208 [facility] Performed at [facility]    Culture   Final    MULTIPLE ORGANISMS PRESENT, NONE PREDOMINANT Performed at [facility]    Report Status PENDING  Incomplete  MRSA PCR Screening     Status: None   Collection Time: 02/02/15  5:13 PM  Result Value  Ref Range Status   MRSA by PCR NEGATIVE NEGATIVE Final    Comment:        The GeneXpert MRSA Assay (FDA approved for NASAL specimens only), is one component of a comprehensive MRSA colonization surveillance program. It is not intended to diagnose MRSA infection nor to guide or monitor treatment for MRSA infections.   Surgical pcr screen     Status: Abnormal   Collection Time: 02/03/15  6:27 AM  Result Value Ref Range Status   MRSA, PCR NEGATIVE NEGATIVE Final   Staphylococcus aureus POSITIVE (A) NEGATIVE Final    Comment:        The Xpert SA Assay (FDA approved for NASAL specimens in patients over 36 years of age), is one component of a comprehensive  surveillance program.  Test performance has been validated by [facility] for patients greater than or equal to 88 year old. It is not intended to diagnose infection nor to guide or monitor treatment.   Anaerobic culture     Status: None (Preliminary result)   Collection Time: 02/03/15  8:29 AM  Result Value Ref Range Status   Specimen Description ABSCESS  Final   Special Requests PENIS  Final   Gram Stain   Final    ABUNDANT WBC PRESENT, PREDOMINANTLY PMN RARE SQUAMOUS EPITHELIAL CELLS PRESENT FEW YEAST ABUNDANT GRAM NEGATIVE RODS Performed at [facility]    Culture   Final    NO ANAEROBES ISOLATED; CULTURE IN PROGRESS FOR 5 DAYS Performed at [facility]    Report Status PENDING  Incomplete  Culture, routine-abscess     Status: None (Preliminary result)   Collection Time: 02/03/15  8:29 AM  Result Value Ref Range Status   Specimen Description ABSCESS  Final   Special Requests PENIS  Final   Gram Stain   Final    MODERATE WBC PRESENT, PREDOMINANTLY PMN RARE SQUAMOUS EPITHELIAL CELLS PRESENT FEW YEAST ABUNDANT GRAM NEGATIVE RODS Performed at [facility]    Culture PENDING  Incomplete   Report Status PENDING  Incomplete    Anti-infectives    Start     Dose/Rate Route Frequency Ordered Stop   02/05/15 1200  ceFEPIme (MAXIPIME) 2 g in dextrose 5 % 50 mL IVPB  Status:  Discontinued     2 g 100 mL/hr over 30 Minutes Intravenous Every T-Th-Sa (Hemodialysis) 02/02/15 0858 02/02/15 1432   02/04/15 1800  ceFEPIme (MAXIPIME) 2 g in dextrose 5 % 50 mL IVPB     2 g 100 mL/hr over 30 Minutes Intravenous  Once 02/04/15 1301     02/02/15 2200  valACYclovir (VALTREX) tablet 1,000 mg  Status:  Discontinued     1,000 mg Oral Daily at bedtime 02/02/15 0858 02/03/15 1051   02/02/15 2200  fluconazole (DIFLUCAN) tablet 200 mg     200 mg Oral Daily at bedtime 02/02/15 0858     02/02/15 1600  ceFEPIme (MAXIPIME) 2 g in dextrose 5 % 50 mL IVPB     2 g 100 mL/hr  over 30 Minutes Intravenous Every T-Th-Sa (Hemodialysis) 02/02/15 1432     02/02/15 1000  fluconazole (DIFLUCAN) tablet 100 mg  Status:  Discontinued     100 mg Oral Daily 02/02/15 0802 02/02/15 0858   02/02/15 1000  valACYclovir (VALTREX) tablet 1,000 mg  Status:  Discontinued     1,000 mg Oral 2 times daily 02/02/15 0802 02/02/15 0858   02/02/15 0900  ceFEPIme (MAXIPIME) 2 g in dextrose 5 % 50  mL IVPB     2 g 100 mL/hr over 30 Minutes Intravenous  Once 02/02/15 0858 02/02/15 1021   02/02/15 0430  acyclovir (ZOVIRAX) 800 mg in dextrose 5 % 150 mL IVPB     800 mg 166 mL/hr over 60 Minutes Intravenous  Once 02/02/15 0422 02/02/15 0655      Assessment: 44 yo male w/ UTI and penoscrotal abscess and s/p I&D on 3/27 on cefepime (also noted on fluconazole for fungal UTI). He is noted with ESRD on for an additional HD session today.  WBC is down to 8.7, SCr= 4.81 (ESRD with typical HD on TTS).  Cefepime 3/26 >> Diflucan 3/26 >> Valtrex 3/26 >>3/27  3/26 urine- multiple present 3/26 wound- -multiple present 3/27 abscess- GNR  Plan:  -Cefepime 2gm IV today then TTS with HD -No fluconazole dose changes needed -Will follow renal function, cultures and clinical progress  Harland German, Pharm D 02/04/2015 1:07 PM

## 2015-02-04 NOTE — Progress Notes (Signed)
Pt resting with eyes closed. Sitter at bedside.  Will continue to monitor. Karena Addison T

## 2015-02-04 NOTE — Procedures (Signed)
I have personally attended this patient's dialysis session.   He is requiring wrist restraints to avoid dislodging needles. 4K bath. BFR 400 He is off schedule (usual is TTS) Extra treatment ordered by Dr. Arlean Hopping for volume issues.  Camille Bal, MD [facility] [phone] Pager 02/04/2015, 1:31 PM

## 2015-02-04 NOTE — Progress Notes (Signed)
Pt constantly turning side to side in bed, pulling at clothing, lines and  Tubings saying "help me". When asked if in pain pt states "no"  Tried to comfort and reorient pt but unable to.  Md notified new orders received. Will continue to monitor. Karena Addison T

## 2015-02-04 NOTE — Progress Notes (Addendum)
[facility] Progress Note   Subjective:  Remains very agitated, restless Can't really tell me where he is Keeps repeating "dialysis" regardless of what I ask him Does take meds for the nurses On contact precautions - appears MRSA PCR was negative in the OR so unclear why on precautions...   Filed Vitals:   02/03/15 2313 02/04/15 0300 02/04/15 0400 02/04/15 0735  BP: 163/80  130/88 120/73  Pulse: 90 94 95 91  Temp: 96.3 F (35.7 C)  96.2 F (35.7 C) 96.4 F (35.8 C)  TempSrc: Axillary  Axillary Axillary  Resp: 18  16 18   Height:      Weight:      SpO2: 100% 100% 100% 100%   Exam: BP 120/73 mmHg  Pulse 91  Temp(Src) 96.4 F (35.8 C) (Axillary)  Resp 18  Ht 5\' 8"  (1.727 m)  Wt 75.1 kg (165 lb 9.1 oz)  BMI 25.18 kg/m2  SpO2 100%  Restless, confused, perseverating a bit No jvd Chest clear bilaterally though poor cooperation with deep breathing S1S2 No S3 no MRG Abd soft, NTND, no mass or ascites GU large dressing over penis/ scrotum, drain and foley in place; not much urine in the tubing 2+ LE edema pretib bilat with scabs/excoriation over right pre-tib region. Right TMA Neuro is awake, not esp alert, confused Left upper AVF with bandaids still in place from last treatment. + bruit and thrill  HD: TTS [facility] 3h  80kg   Heparin none   L arm AVF       Assessment/Recommendations  1. Penile/ scrotal abcess, s/p I&D in the OR. Drainage from penis - gm stain read as Gm neg cocci; Gm stain from the OR - Gm neg rods and a few yeast. On Maxipime and diflucan. With improving leukocytosis 2. ESRD on HD - normal schedule TTS. Last HD 3/26. Dr. wrote for extra treatment today d/t mod edema. HOPE he will hold still for treatment as is moving about quite a bit now, hands in mittens. Will get back on schedule before discharge. 3. DKA / DM 1 - back on SQ insulin 4. HTN on catapres patch TTS1 and IV metoprolol QID (?) 5. Volume -  +vol excess  w LE edema 6. PAD hx toe amps and TMA 7. 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 CAD prior CABG 10. Hypothyroidism on replacement 11. AMS - ? Presume d/t infection?  Appears well dialyzed so I doubt this is uremic encephalopathy. I don't know his baseline but reportedly way off as he drives himself to HD by report.  08/2014, MD [facility] [phone] Pager 02/04/2015, 9:36 AM     Recent Labs Lab 02/03/15 2005 02/04/15 0116 02/04/15 0620  NA 133* 133* 133*  K 4.5 3.8 3.9  CL 96 95* 97  CO2 21 23 23   GLUCOSE 166* 134* 109*  BUN 23 24* 25*  CREATININE 4.40* 4.73* 4.81*  CALCIUM 8.9 9.2 9.0    Recent Labs Lab 02/02/15 0218 02/02/15 0509  AST 17 24  ALT 10 8  ALKPHOS 282* 244*  BILITOT 2.3* 3.2*  PROT 7.6 7.0  ALBUMIN 3.2* 2.8*    Recent Labs Lab 02/02/15 0218 02/02/15 0509 02/03/15 0323 02/03/15 0751 02/04/15 0620  WBC 14.0* 14.8*  7.8  --  8.7  NEUTROABS 12.8*  --   --   --   --   HGB 11.2* 10.5* 10.1* 11.9* 11.5*  HCT 37.1* 34.7* 31.6* 35.0* 36.5*  MCV 94.4 94.0 89.0  --  90.3  PLT 238 210 136*  --  132*   Medications . aspirin  81 mg Oral q morning - 10a  . ceFEPime (MAXIPIME) IV  2 g Intravenous Q T,Th,Sa-HD  . Chlorhexidine Gluconate Cloth  6 each Topical Q0600  . cloNIDine  0.1 mg Transdermal Weekly  . fluconazole  200 mg Oral QHS  . heparin  5,000 Units Subcutaneous 3 times per day  . insulin aspart  0-15 Units Subcutaneous 6 times per day  . insulin glargine  10 Units Subcutaneous Daily  . levothyroxine  75 mcg Oral QAC breakfast  . metoprolol  5 mg Intravenous 4 times per day  . mupirocin ointment  1 application Nasal BID  . sodium chloride  3 mL Intravenous Q12H   . dextrose 40 mL (02/03/15 2000)    haloperidol lactate, hydrALAZINE, LORazepam, morphine injection

## 2015-02-04 NOTE — Progress Notes (Signed)
Gluco stabilizer discontinued per protocol. Insulin orders placed by MD.  Will continue to check cbg every 4 hours.   Rise Paganini, RN

## 2015-02-04 NOTE — Progress Notes (Signed)
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 Dialysis at the [facility] unit. Now with recurrent L corporal infection at the penile base.   Pt has normal urethra, and still makes enough urine to void each spontaneously each day. Note MRSA NEG consistently; with wound gram stain showing gram NEG Cocci. Rx Cefapime. Temp down this AM. WBC was 14k; now down to 8.7 today.  Objective: Vital signs in last 24 hours: Temp:  [96.2 F (35.7 C)-98 F (36.7 C)] 96.2 F (35.7 C) (03/28 0400) Pulse Rate:  [89-109] 95 (03/28 0400) Resp:  [12-25] 16 (03/28 0400) BP: (130-198)/(73-93) 130/88 mmHg (03/28 0400) SpO2:  [97 %-100 %] 100 % (03/28 0400)  Intake/Output from previous day: 03/27 0701 - 03/28 0700 In: 1312.7 [P.O.:60; I.V.:1252.7] Out: 475 [Urine:475] Intake/Output this shift:    Physical Exam:  General:alert and cooperative  Male genitalia: not done Scrotum: abscess: left: drain and pack.    Lab Results:  Recent Labs  02/03/15 0323 02/03/15 0751 02/04/15 0620  HGB 10.1* 11.9* 11.5*  HCT 31.6* 35.0* 36.5*   BMET  Recent Labs  02/03/15 2005 02/04/15 0116  NA 133* 133*  K 4.5 3.8  CL 96 95*  CO2 21 23  GLUCOSE 166* 134*  BUN 23 24*  CREATININE 4.40* 4.73*  CALCIUM 8.9 9.2    Recent Labs  02/02/15 0509  INR 1.60*   No results for input(s): LABURIN in the last 72 hours. Results for orders placed or performed during the hospital encounter of 02/02/15  Stat Gram stain     Status: None   Collection Time: 02/02/15 10:50  AM  Result Value Ref Range Status   Specimen Description PENILE DRAINAGE  Final   Special Requests Immunocompromised  Final   Gram Stain   Final    ABUNDANT WBC PRESENT,BOTH PMN AND MONONUCLEAR ABUNDANT GRAM NEGATIVE COCCI IN DIPLOS Gram Stain Report Called to,Read Back By and Verified With: A [facility] 02/02/15 1207 [facility]    Report Status 02/02/2015 FINAL  Final  Wound culture     Status: None (Preliminary result)   Collection Time: 02/02/15 10:50 AM  Result Value Ref Range Status   Specimen Description PENILE DRAINAGE  Final   Special Requests Immunocompromised  Final   Gram Stain   Final    ABUNDANT WBC PRESENT,BOTH PMN AND MONONUCLEAR ABUNDANT GRAM NEGATIVE COCCI Gram Stain Report Called to,Read Back By and Verified With: Gram Stain Report Called to,Read Back By and Verified With: A THOMPSON RN 02/02/15 120[address] [facility] Performed at [facility]    Culture   Final    Culture reincubated for better growth Performed at [facility]    Report Status PENDING  Incomplete  MRSA PCR Screening     Status: None   Collection Time: 02/02/15  5:13 PM  Result Value Ref Range Status   MRSA by PCR NEGATIVE NEGATIVE Final    Comment:        The GeneXpert MRSA Assay (FDA approved for NASAL specimens only), is one component of a comprehensive MRSA colonization surveillance program. It is not intended to diagnose MRSA infection  nor to guide or monitor treatment for MRSA infections.   Surgical pcr screen     Status: Abnormal   Collection Time: 02/03/15  6:27 AM  Result Value Ref Range Status   MRSA, PCR NEGATIVE NEGATIVE Final   Staphylococcus aureus POSITIVE (A) NEGATIVE Final    Comment:        The Xpert SA Assay (FDA approved for NASAL specimens in patients over 46 years of age), is one component of a comprehensive surveillance program.  Test performance has been validated by [facility] for patients greater than or equal to 53 year old. It is not  intended to diagnose infection nor to guide or monitor treatment.   Anaerobic culture     Status: None (Preliminary result)   Collection Time: 02/03/15  8:29 AM  Result Value Ref Range Status   Specimen Description ABSCESS  Final   Special Requests PENIS  Final   Gram Stain   Final    ABUNDANT WBC PRESENT, PREDOMINANTLY PMN RARE SQUAMOUS EPITHELIAL CELLS PRESENT FEW YEAST ABUNDANT GRAM NEGATIVE RODS Performed at [facility]    Culture PENDING  Incomplete   Report Status PENDING  Incomplete  Culture, routine-abscess     Status: None (Preliminary result)   Collection Time: 02/03/15  8:29 AM  Result Value Ref Range Status   Specimen Description ABSCESS  Final   Special Requests PENIS  Final   Gram Stain   Final    MODERATE WBC PRESENT, PREDOMINANTLY PMN RARE SQUAMOUS EPITHELIAL CELLS PRESENT FEW YEAST ABUNDANT GRAM NEGATIVE RODS Performed at [facility]    Culture PENDING  Incomplete   Report Status PENDING  Incomplete    Studies/Results: Ct Head Wo Contrast  02/03/2015   CLINICAL DATA:  Altered mental status recent pelvic surgery  EXAM: CT HEAD WITHOUT CONTRAST  TECHNIQUE: Contiguous axial images were obtained from the base of the skull through the vertex without intravenous contrast.  COMPARISON:  None.  FINDINGS: No skull fracture is noted. Mild mucosal thickening right maxillary sinus. The mastoid air cells are unremarkable. Atherosclerotic calcifications of carotid siphon.  No intracranial hemorrhage, mass effect or midline shift. No intra or extra-axial fluid collection. No acute cortical infarction. No mass lesion is noted on this unenhanced scan.  IMPRESSION: No acute intracranial abnormality. Atherosclerotic calcifications are noted bilateral carotid siphon. No definite acute cortical infarction. Mild mucosal thickening right maxillary sinus.   Electronically Signed   By: Natasha Mead M.D.   On: 02/03/2015 13:52   Ct Pelvis W/o & W Cm  02/02/2015    CLINICAL DATA:  Status post penile prosthesis placement and removal, with underlying chronic wound. Assess for pelvic or inguinal abscess.  EXAM: CT PELVIS WITH AND WITHOUT CONTRAST  TECHNIQUE: Multidetector CT imaging of the pelvis was performed following the standard protocol before and following the bolus administration of intravenous contrast.  CONTRAST:  OMNIPAQUE IOHEXOL 300 MG/ML  SOLN  COMPARISON:  CT of the abdomen and pelvis from 12/19/2014  FINDINGS: There is a focal 6.2 x 4.0 x 4.0 cm collection of fluid and air at the inferior left inguinal region directly adjacent to the left side of the penile shaft, anterior to the left spermatic cord, with minimal peripheral enhancement on contrast enhanced images, compatible with an abscess.  In addition, there is a collection of fluid and trace air extending along the left corpora cavernosum within the distal penis, measuring approximately 4.6 x 2.2 x 1.6 cm, with minimal peripheral enhancement. There may  be minimal communication between these two collections, suggested on noncontrast images, though not definitely seen on contrast-enhanced images.  Surrounding soft tissue inflammation is seen. Edema is noted about the scrotum. 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 Mild soft tissue edema is noted extending superiorly, with mild postoperative soft tissue edema at the right inguinal region, and mild soft tissue edema along both flanks.  Minimal soft tissue air at the anterior right lower quadrant abdominal wall is thought to reflect an injection site.  A small to moderate amount of borderline complex fluid is noted within the lower abdomen and pelvis. There is somewhat unusual wall thickening at the cecum, distal descending colon and distal sigmoid colon. This could reflect underlying mild colitis, though a cecal mass cannot be entirely excluded. Would correlate for evidence of colitis, and after resolution of the acute process, would consider colonoscopy to  assess the ascending colon and cecum.  There is decompression of the bladder, with a Foley catheter in place. Diffuse bladder wall thickening may reflect decompression, though would correlate for evidence of cystitis. Minimal vascular calcifications are seen. Visualized inguinal nodes are normal in size. The prostate is borderline normal in size.  No acute osseous abnormalities are seen.  IMPRESSION: 1. 6.2 x 4.0 x 4.7 cm evolving abscess noted at the inferior left inguinal region, directly adjacent to the left side of penile shaft and anterior to the left spermatic cord, containing fluid and air. 2. 4.6 x 2.2 x 1.6 cm likely evolving abscess noted extending along the left corpora cavernosum within the distal penis, containing fluid and trace air. There may be minimal communication between these two fluid collections, though this is not well seen. 3. Surrounding soft tissue inflammation and edema seen about the scrotum. 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 Mild soft tissue edema extends superiorly, with mild postoperative soft tissue edema at the right inguinal region, and mild soft tissue edema along both flanks. 4. Somewhat unusual wall thickening at the cecum, distal descending colon and distal sigmoid colon. This could reflect underlying acute mild infectious or inflammatory colitis. Would correlate clinically for associated findings. A cecal mass cannot be entirely excluded. After resolution of the acute process, would consider colonoscopy to assess the ascending colon and cecum. 5. Small to moderate amount of borderline complex fluid noted within the lower abdomen and pelvis. 6. Bladder decompressed and not well assessed; diffuse bladder wall thickening likely reflects decompression, though would correlate to exclude cystitis.  These results were called by telephone at the time of interpretation on 02/02/2015 at 12:10 pm to Dr. [name], who verbally acknowledged these results.   Electronically Signed    By: Roanna Raider M.D.   On: 02/02/2015 12:11    Assessment/Plan: Improvement with aggressive I/S and cefepime. Will leave drains today and discuss slow drain advancement with Dr. Berneice Heinrich. Waiting for full c/s report. Concern is for antibiotic getting dialyzed off. Will need ID/Nephrology/pharmacy input for  Extended out-pt dosing.    LOS: 2 days   Artemisa Sladek I [place] 02/04/2015, 7:06 AM

## 2015-02-04 NOTE — Progress Notes (Signed)
Pt in HD, disoriented and restless. MD ordered bilateral wrist restraints for HD tx only.

## 2015-02-04 NOTE — Progress Notes (Signed)
Surgical PCR POSITIVE for STAPH but negative for MRSA. ID says ok to take off contact

## 2015-02-04 NOTE — Consult Note (Addendum)
Reason for Consult:Altered mental status Referring Physician: [place] Pink  CC: Confusion  HPI: Taylor [name] is an 44 y.o. male with a history of ESRD on dialysis admitted with accelerated HTN in DKA and a penoscrotal abscess on 02/02/2015.  Initially patient was febrile with elevated WBC count.  PH 7.1 with metabolic acidosis.  DKA resolved as of today.  WBC count improved and patient no longer febrile s/p irrigation and debridement of abscess on yesterday.  Patient on Cefepime.  Patient remains confused.    Past Medical History  Diagnosis Date  . End stage renal disease on dialysis     LUE fistula  . Type I diabetes mellitus     a. 03/2014 admitted with HNK to [facility].  . Diabetic neuropathy     severe, s/p multiple toe amputation  . Hypothyroidism   . Hypertension   . Hyperlipidemia   . H/O hiatal hernia   . GERD (gastroesophageal reflux disease)   . Anxiety   . Sebaceous cyst     side of neck  . Pneumonia     2010  . Anemia     a. req PRBC's 2011.  [name] PAD (peripheral artery disease)     a. s/p amputation of toes on the right;  b. left LE claudication.  . Coronary artery disease     a. s/p MI;  b. 10/2009 CABG x 3 @ [facility]: LIMA->LAD, VG->OM3, VG->RPDA; c. 11/2010 Cath 3/3 patent grafts;  d 12/2012 Cath: LM 30d, LAD 85p, D1 70, D2 90, LCX 40ost, OM2 100, RCA 90p, 138m L->LAD ok, VG->OM3 ok, VG->RPDA 30, EF 50%->Med Rx.  . Cataract     right  . Valvular disease     a. 11/2012 Echo: EF 55-60%, mild LVH, mild MR, mild bi-atrial enlargement, mild-mod TR, PASP 435mg.  . CHF (congestive heart failure)   . Depression   . Arthritis     rheumatoid arthritis   . Hx of transfusion of packed red blood cells   . Myocardial infarction 2010  . Anginal pain     Past Surgical History  Procedure Laterality Date  . Dialysis fistula creation      left upper arm fistula  . Amputation      TOES ON BOTH FEET  . Abscess drainage  Behind right ear/occipital scalp  . Skin graft    . Eye  surgery  1999  . Coronary artery bypass graft  10/2009    [facility] (Dr. SmTamala Julian . Foot amputation    . Cardiac catheterization    . Cardiac catheterization  2/14    [facility]: severe 3 vessel CAD with patent grafts, RHC: moderately elevated PCW and pulmonary hypertension  . Penile prosthesis implant N/A 07/24/2014    Procedure: SALINE PENILE INJECTION WITH DISSECTION OF CORPORA ,  IMPLANTATION OF COLOPLAST PENILE PROTHESIS INFLATABLE;  Surgeon: SiAilene RudMD;  Location: [facility];  Service: Urology;  Laterality: N/A;  . Removal of penile prosthesis N/A 08/22/2014    Procedure: EXPLANT OF INFECTED  PENILE PROSTHESIS;  Surgeon: StJorja LoaMD;  Location: [facility];  Service: Urology;  Laterality: N/A;  . Irrigation and debridement abscess Bilateral 12/14/2014    Procedure: Bilateral Corporal Irrigation with Cultures and Drainage, Penrose Drain Insertion;  Surgeon: SiAilene RudMD;  [place] [facility] Service: Urology;  Laterality: Bilateral;  . Scrotal exploration N/A 02/03/2015    Procedure: IRRIGATION AND DEBRIDEMENT SCROTAL/PENILE ABSCESS;  Surgeon: ThAlexis FrockMD;  [place] [facility] Service:  Urology;  Laterality: N/A;  . Cystoscopy N/A 02/03/2015    Procedure: CYSTOSCOPY;  Surgeon: Alexis Frock, MD;  Location: [place];  Service: Urology;  Laterality: N/A;    Family History  Problem Relation Age of Onset  . Heart disease Other   . Hypertension Other   . Hypertension Mother   . Heart disease Mother   . Diabetes Mother   . Birth defects Paternal Uncle     unaware  . Birth defects Paternal Grandmother     breast    Social History:  reports that he has never smoked. He quit smokeless tobacco use about 19 years ago. His smokeless tobacco use included Chew. He reports that he drinks alcohol. He reports that he does not use illicit drugs.  Allergies  Allergen Reactions  . Amoxicillin-Pot Clavulanate Nausea And Vomiting  . Hydromorphone Hcl     "body started  down"  08/22/14 - pt states no longer has problems with this  . Rifampin Nausea And Vomiting    Medications:  I have reviewed the patient's current medications. Prior to Admission:  Prescriptions prior to admission  Medication Sig Dispense Refill Last Dose  . amitriptyline (ELAVIL) 25 MG tablet Take 1 tablet (25 mg total) by mouth at bedtime. 30 tablet 1 02/01/2015 at Unknown time  . aspirin 81 MG tablet Take 81 mg by mouth every morning.    02/01/2015 at Unknown time  . calcium carbonate (TUMS - DOSED IN MG ELEMENTAL CALCIUM) 500 MG chewable tablet Chew 1-2 tablets (200-400 mg of elemental calcium total) by mouth 2 (two) times daily as needed for indigestion or heartburn. 1 tablet 0 02/01/2015 at Unknown time  . carvedilol (COREG) 25 MG tablet Take 25 mg by mouth 2 (two) times daily with a meal.   02/01/2015 at 2030  . cinacalcet (SENSIPAR) 60 MG tablet Take 60 mg by mouth at bedtime. With biggest meal   02/01/2015 at Unknown time  . citalopram (CELEXA) 20 MG tablet TAKE 1 BY MOUTH DAILY (Patient taking differently: TAKE 1 BY MOUTH NIGHTLY AT BEDTIME.) 30 tablet 2 Past Week at Unknown time  . cloNIDine (CATAPRES) 0.1 MG tablet Take 0.1 mg by mouth 2 (two) times daily.   02/01/2015 at Unknown time  . furosemide (LASIX) 80 MG tablet Take 80 mg by mouth 2 (two) times daily.   02/01/2015 at Unknown time  . HYDROcodone-acetaminophen (NORCO/VICODIN) 5-325 MG per tablet Take 1 tablet by mouth every 4 (four) hours as needed for moderate pain. 30 tablet 0 02/01/2015 at Unknown time  . insulin aspart (NOVOLOG) 100 UNIT/ML injection Inject 7 Units into the skin 3 (three) times daily with meals. (Patient taking differently: Inject 7-17 Units into the skin 3 (three) times daily with meals. Sliding scale) 10 mL 11 02/01/2015 at Unknown time  . insulin glargine (LANTUS) 100 UNIT/ML injection Inject 0.3 mLs (30 Units total) into the skin daily. 10 mL 11 02/01/2015 at Unknown time  . isosorbide mononitrate (IMDUR) 30 MG  24 hr tablet Take 30 mg by mouth 2 (two) times daily.   02/01/2015 at Unknown time  . levothyroxine (SYNTHROID, LEVOTHROID) 75 MCG tablet Take 1 tablet (75 mcg total) by mouth daily before breakfast. 90 tablet 1 02/01/2015 at Unknown time  . LYRICA 150 MG capsule TAKE 1 CAPSULE BY MOUTH EVERY MORNING AND 2 CAPSULE EVERY EVENING 90 capsule 0 02/01/2015 at Unknown time  . Nutritional Supplements (FEEDING SUPPLEMENT, NEPRO CARB STEADY,) LIQD Take 237 mLs by mouth as needed (missed  meal during dialysis.). 30 Can 0 Past Month at Unknown time  . nystatin-triamcinolone (MYCOLOG II) cream Apply 1 application topically every 8 (eight) hours as needed (rash).    02/01/2015 at Unknown time  . Omega-3 Fatty Acids (OMEGA 3 PO) Take 1,000 mg by mouth daily.   02/01/2015 at Unknown time  . omeprazole (PRILOSEC) 40 MG capsule Take 40 mg by mouth every morning.    02/01/2015 at Unknown time  . PARoxetine (PAXIL) 20 MG tablet Take 20 mg by mouth every morning.    02/01/2015 at Unknown time  . rosuvastatin (CRESTOR) 20 MG tablet Take 1 tablet (20 mg total) by mouth at bedtime. 30 tablet 5 02/01/2015 at Unknown time  . sevelamer carbonate (RENVELA) 800 MG tablet Take 800 mg by mouth 3 (three) times daily with meals.   02/01/2015 at Unknown time  . acetaminophen (TYLENOL) 500 MG tablet Take 1,000 mg by mouth every 6 (six) hours as needed (pain).   unknown  . permethrin (ELIMITE) 5 % cream APPLY TOPICALLY ONCE AS DIRECTED (Patient not taking: Reported on 02/02/2015) 60 g 0    Scheduled: . aspirin  81 mg Oral q morning - 10a  . ceFEPime (MAXIPIME) IV  2 g Intravenous Q T,Th,Sa-HD  . Chlorhexidine Gluconate Cloth  6 each Topical Q0600  . cloNIDine  0.1 mg Transdermal Weekly  . fluconazole  200 mg Oral QHS  . heparin  5,000 Units Subcutaneous 3 times per day  . insulin aspart  0-15 Units Subcutaneous 6 times per day  . insulin glargine  10 Units Subcutaneous Daily  . levothyroxine  75 mcg Oral QAC breakfast  . metoprolol  5 mg  Intravenous 4 times per day  . mupirocin ointment  1 application Nasal BID  . sodium chloride  3 mL Intravenous Q12H    ROS: Unable to obtain although patient reports that he is in no pain.    Physical Examination: Blood pressure 135/54, pulse 89, temperature 98.1 F (36.7 C), temperature source Axillary, resp. rate 22, height _0  (1.727 m), weight 75.6 kg (166 lb 10.7 oz), SpO2 100 %.  HEENT-  Normocephalic, no lesions, without obvious abnormality.  Normal external eye and conjunctiva.  Normal TM's bilaterally.  Normal auditory canals and external ears. Normal external nose, mucus membranes and septum.  Normal pharynx. Cardiovascular- S1, S2 normal, pulses palpable throughout   Lungs- chest clear, no wheezing, rales, normal symmetric air entry Abdomen- soft, non-tender; bowel sounds normal; no masses,  no organomegaly Extremities- no edema Lymph-no adenopathy palpable Musculoskeletal-bilateral toe amputations Skin-healing sores on legs bilaterally  Neurological Examination Mental Status: Alert.  Restless.  Moves arms around constantly.  Follows simple commands.  When asked where he is he repots that he is in dialysis.  Does not respond to any other orientation questions.  Speech fluent without evidence of aphasia.   Cranial Nerves: II: Discs flat bilaterally; Does not blink to bilateral confrontation, pupils equal, round, reactive to light and accommodation III,IV, VI: ptosis not present, extra-ocular motions intact bilaterally V,VII: smile symmetric, facial light touch sensation normal bilaterally VIII: hearing normal bilaterally IX,X: gag reflex present XI: bilateral shoulder shrug XII: midline tongue extension Motor: Moves all extremities against gravity and to command.  No focal weakness noted.   Sensory: Appreciates light touch in all extremities.   Deep Tendon Reflexes: 2+ in the upper extremities and absent in the lower extremities.   Plantars: Unable to assess due to  bilateral toe amputations Cerebellar: Unable to perform  Gait: not performed secondary to safety concerns.     Laboratory Studies:   Basic Metabolic Panel:  Recent Labs Lab 02/03/15 1126 02/03/15 1627 02/03/15 2005 02/04/15 0116 02/04/15 0620  NA 135 136 133* 133* 133*  K 3.1* 3.7 4.5 3.8 3.9  CL 96 99 96 95* 97  CO2 _0 GLUCOSE 232* 145* 166* 134* 109*  BUN _1 24* 25*  CREATININE 4.04* 4.26* 4.40* 4.73* 4.81*  CALCIUM 9.2 9.1 8.9 9.2 9.0    Liver Function Tests:  Recent Labs Lab 02/02/15 0218 02/02/15 0509  AST 17 24  ALT 10 8  ALKPHOS 282* 244*  BILITOT 2.3* 3.2*  PROT 7.6 7.0  ALBUMIN 3.2* 2.8*   No results for input(s): LIPASE, AMYLASE in the last 168 hours. No results for input(s): AMMONIA in the last 168 hours.  CBC:  Recent Labs Lab 02/02/15 0218 02/02/15 0509 02/03/15 0323 02/03/15 0751 02/04/15 0620  WBC 14.0* 14.8* 7.8  --  8.7  NEUTROABS 12.8*  --   --   --   --   HGB 11.2* 10.5* 10.1* 11.9* 11.5*  HCT 37.1* 34.7* 31.6* 35.0* 36.5*  MCV 94.4 94.0 89.0  --  90.3  PLT 238 210 136*  --  132*    Cardiac Enzymes: No results for input(s): CKTOTAL, CKMB, CKMBINDEX, TROPONINI in the last 168 hours.  BNP: Invalid input(s): POCBNP  CBG:  Recent Labs Lab 02/04/15 0649 02/04/15 0755 02/04/15 1147 02/04/15 1340 02/04/15 1800  GLUCAP 94 105* 116* 107* 82    Microbiology: Results for orders placed or performed during the hospital encounter of 02/02/15  Urine culture     Status: None   Collection Time: 02/02/15  3:57 AM  Result Value Ref Range Status   Specimen Description URINE, RANDOM  Final   Special Requests NONE  Final   Colony Count   Final    >=100,000 COLONIES/ML Performed at [facility]    Culture   Final    Multiple bacterial morphotypes present, none predominant. Suggest appropriate recollection if clinically indicated. Performed at [facility]    Report Status 02/04/2015 FINAL  Final   Stat Gram stain     Status: None   Collection Time: 02/02/15 10:50 AM  Result Value Ref Range Status   Specimen Description PENILE DRAINAGE  Final   Special Requests Immunocompromised  Final   Gram Stain   Final    ABUNDANT WBC PRESENT,BOTH PMN AND MONONUCLEAR ABUNDANT GRAM NEGATIVE COCCI IN DIPLOS Gram Stain Report Called to,Read Back By and Verified With: A THOMPSON,RN 02/02/15 1207 [facility]    Report Status 02/02/2015 FINAL  Final  Wound culture     Status: None (Preliminary result)   Collection Time: 02/02/15 10:50 AM  Result Value Ref Range Status   Specimen Description PENILE DRAINAGE  Final   Special Requests Immunocompromised  Final   Gram Stain   Final    ABUNDANT WBC PRESENT,BOTH PMN AND MONONUCLEAR ABUNDANT GRAM NEGATIVE COCCI Gram Stain Report Called to,Read Back By and Verified With: Gram Stain Report Called to,Read Back By and Verified With: A THOMPSON RN 02/02/15 1207 Jerilynn Mages [facility] Performed at [facility]    Culture   Final    MULTIPLE ORGANISMS PRESENT, NONE PREDOMINANT Performed at [facility]    Report Status PENDING  Incomplete  MRSA PCR Screening     Status: None   Collection Time: 02/02/15  5:13 PM  Result Value  Ref Range Status   MRSA by PCR NEGATIVE NEGATIVE Final    Comment:        The GeneXpert MRSA Assay (FDA approved for NASAL specimens only), is one component of a comprehensive MRSA colonization surveillance program. It is not intended to diagnose MRSA infection nor to guide or monitor treatment for MRSA infections.   Surgical pcr screen     Status: Abnormal   Collection Time: 02/03/15  6:27 AM  Result Value Ref Range Status   MRSA, PCR NEGATIVE NEGATIVE Final   Staphylococcus aureus POSITIVE (A) NEGATIVE Final    Comment:        The Xpert SA Assay (FDA approved for NASAL specimens in patients over 47 years of age), is one component of a comprehensive surveillance program.  Test performance has been validated by  [facility] for patients greater than or equal to 33 year old. It is not intended to diagnose infection nor to guide or monitor treatment.   Anaerobic culture     Status: None (Preliminary result)   Collection Time: 02/03/15  8:29 AM  Result Value Ref Range Status   Specimen Description ABSCESS  Final   Special Requests PENIS  Final   Gram Stain   Final    ABUNDANT WBC PRESENT, PREDOMINANTLY PMN RARE SQUAMOUS EPITHELIAL CELLS PRESENT FEW YEAST ABUNDANT GRAM NEGATIVE RODS Performed at [facility]    Culture   Final    NO ANAEROBES ISOLATED; CULTURE IN PROGRESS FOR 5 DAYS Performed at [facility]    Report Status PENDING  Incomplete  Culture, routine-abscess     Status: None (Preliminary result)   Collection Time: 02/03/15  8:29 AM  Result Value Ref Range Status   Specimen Description ABSCESS  Final   Special Requests PENIS  Final   Gram Stain   Final    MODERATE WBC PRESENT, PREDOMINANTLY PMN RARE SQUAMOUS EPITHELIAL CELLS PRESENT FEW YEAST ABUNDANT GRAM NEGATIVE RODS Performed at [facility]    Culture   Final    MODERATE ESCHERICHIA COLI Performed at [facility]    Report Status PENDING  Incomplete    Coagulation Studies:  Recent Labs  02/02/15 0509  LABPROT 19.2*  INR 1.60*    Urinalysis:  Recent Labs Lab 02/02/15 0357  COLORURINE AMBER*  LABSPEC 1.021  PHURINE 6.0  GLUCOSEU NEGATIVE  HGBUR LARGE*  BILIRUBINUR SMALL*  KETONESUR 40*  PROTEINUR >300*  UROBILINOGEN 0.2  NITRITE NEGATIVE  LEUKOCYTESUR LARGE*    Lipid Panel:     Component Value Date/Time   CHOL 93 04/09/2014 0900   TRIG 86.0 04/09/2014 0900   HDL 40.00 04/09/2014 0900   CHOLHDL 2 04/09/2014 0900   VLDL 17.2 04/09/2014 0900   LDLCALC 36 04/09/2014 0900    HgbA1C:  Lab Results  Component Value Date   HGBA1C 15.4* 12/10/2014    Urine Drug Screen:  No results found for: LABOPIA, COCAINSCRNUR, LABBENZ, AMPHETMU, THCU, LABBARB  Alcohol  Level: No results for input(s): ETH in the last 168 hours.  Other results: EKG: sinus tachycardia at 103 bpm, RBBB, left atrial enlargement.  Imaging: Ct Head Wo Contrast  02/03/2015   CLINICAL DATA:  Altered mental status recent pelvic surgery  EXAM: CT HEAD WITHOUT CONTRAST  TECHNIQUE: Contiguous axial images were obtained from the base of the skull through the vertex without intravenous contrast.  COMPARISON:  None.  FINDINGS: No skull fracture is noted. Mild mucosal thickening right maxillary sinus. The mastoid air cells are  unremarkable. Atherosclerotic calcifications of carotid siphon.  No intracranial hemorrhage, mass effect or midline shift. No intra or extra-axial fluid collection. No acute cortical infarction. No mass lesion is noted on this unenhanced scan.  IMPRESSION: No acute intracranial abnormality. Atherosclerotic calcifications are noted bilateral carotid siphon. No definite acute cortical infarction. Mild mucosal thickening right maxillary sinus.   Electronically Signed   By: Lahoma Crocker M.D.   On: 02/03/2015 13:52     Assessment/Plan: Patient slowly improving from DKA.  On antibiotics for abscess but remains delirious on neurological examination.  No focality noted on examination though.  Head CT personally reviewed and shows no acute changes.  Suspect that delirium likely represents a metabolic encephalopathy.  Although lab work improved, it is not unusual for cognition to lag behind the numbers.  Patient has only had three doses of antibiotics and DKA resolution as of today.  There is some concern for liver function though with elevated alk phos and bili.    Recommendations: 1.  EEG 2.  Hepatic function panel 3.  Will continue to follow with you.  Agree with continued antibiotics and diabetes treatment  Alexis Goodell, MD [facility] [phone] 02/04/2015, 6:11 PM

## 2015-02-04 NOTE — Progress Notes (Addendum)
During report called to pt's room by sitter.  Upon entering noted right EJ pulled out.  Held pressure and placed gauze.  Pt pulling at ekg leads and clothing, oxygen line.  Place aromatherapy lavender at bedside.  Vs stable.  Will continue to monitor.  Karena Addison T

## 2015-02-05 ENCOUNTER — Inpatient Hospital Stay (HOSPITAL_COMMUNITY): Payer: Medicare Other

## 2015-02-05 DIAGNOSIS — N4829 Other inflammatory disorders of penis: Secondary | ICD-10-CM

## 2015-02-05 DIAGNOSIS — A4151 Sepsis due to Escherichia coli [E. coli]: Principal | ICD-10-CM

## 2015-02-05 DIAGNOSIS — R4182 Altered mental status, unspecified: Secondary | ICD-10-CM

## 2015-02-05 LAB — RENAL FUNCTION PANEL
ALBUMIN: 2.5 g/dL — AB (ref 3.5–5.2)
Anion gap: 13 (ref 5–15)
BUN: 14 mg/dL (ref 6–23)
CO2: 24 mmol/L (ref 19–32)
CREATININE: 3.58 mg/dL — AB (ref 0.50–1.35)
Calcium: 9.2 mg/dL (ref 8.4–10.5)
Chloride: 100 mmol/L (ref 96–112)
GFR calc Af Amer: 22 mL/min — ABNORMAL LOW (ref >90)
GFR, EST NON AFRICAN AMERICAN: 19 mL/min — AB (ref >90)
Glucose, Bld: 97 mg/dL (ref 70–99)
Phosphorus: 4 mg/dL (ref 2.3–4.6)
Potassium: 3.8 mmol/L (ref 3.5–5.1)
Sodium: 137 mmol/L (ref 135–145)

## 2015-02-05 LAB — HEPATIC FUNCTION PANEL
ALBUMIN: 2.8 g/dL — AB (ref 3.5–5.2)
ALT: 17 U/L (ref 0–53)
AST: 48 U/L — ABNORMAL HIGH (ref 0–37)
Alkaline Phosphatase: 241 U/L — ABNORMAL HIGH (ref 39–117)
Bilirubin, Direct: 0.5 mg/dL (ref 0.0–0.5)
Indirect Bilirubin: 1 mg/dL — ABNORMAL HIGH (ref 0.3–0.9)
Total Bilirubin: 1.5 mg/dL — ABNORMAL HIGH (ref 0.3–1.2)
Total Protein: 7 g/dL (ref 6.0–8.3)

## 2015-02-05 LAB — WOUND CULTURE

## 2015-02-05 LAB — CULTURE, ROUTINE-ABSCESS

## 2015-02-05 LAB — GLUCOSE, CAPILLARY
GLUCOSE-CAPILLARY: 90 mg/dL (ref 70–99)
Glucose-Capillary: 93 mg/dL (ref 70–99)
Glucose-Capillary: 183 mg/dL — ABNORMAL HIGH (ref 70–99)
Glucose-Capillary: 247 mg/dL — ABNORMAL HIGH (ref 70–99)
Glucose-Capillary: 221 mg/dL — ABNORMAL HIGH (ref 70–99)

## 2015-02-05 LAB — CBC
HCT: 35.5 % — ABNORMAL LOW (ref 39.0–52.0)
HEMOGLOBIN: 11.3 g/dL — AB (ref 13.0–17.0)
MCH: 28.9 pg (ref 26.0–34.0)
MCHC: 31.8 g/dL (ref 30.0–36.0)
MCV: 90.8 fL (ref 78.0–100.0)
Platelets: 121 10*3/uL — ABNORMAL LOW (ref 150–400)
RBC: 3.91 MIL/uL — ABNORMAL LOW (ref 4.22–5.81)
RDW: 16.5 % — ABNORMAL HIGH (ref 11.5–15.5)
WBC: 7.3 10*3/uL (ref 4.0–10.5)

## 2015-02-05 MED ORDER — CEFTRIAXONE SODIUM IN DEXTROSE 20 MG/ML IV SOLN
1.0000 g | INTRAVENOUS | Status: DC
  Administered 2015-02-05 – 2015-02-06 (×2): 1 g via INTRAVENOUS
  Filled 2015-02-05 (×2): qty 50

## 2015-02-05 MED ORDER — INSULIN ASPART 100 UNIT/ML ~~LOC~~ SOLN
0.0000 [IU] | Freq: Three times a day (TID) | SUBCUTANEOUS | Status: DC
  Administered 2015-02-06: 8 [IU] via SUBCUTANEOUS
  Administered 2015-02-07: 15 [IU] via SUBCUTANEOUS
  Administered 2015-02-07 (×2): 8 [IU] via SUBCUTANEOUS
  Administered 2015-02-08: 3 [IU] via SUBCUTANEOUS

## 2015-02-05 MED ORDER — CLONIDINE HCL 0.2 MG/24HR TD PTWK
0.2000 mg | MEDICATED_PATCH | TRANSDERMAL | Status: DC
  Administered 2015-02-05: 0.2 mg via TRANSDERMAL
  Filled 2015-02-05: qty 1

## 2015-02-05 NOTE — Procedures (Signed)
ELECTROENCEPHALOGRAM REPORT   Patient: Taylor [name]     Room #: 2H-17 Age: 44 y.o.        Sex: male Report Date:  02/05/2015        Interpreting Physician: Omelia Blackwater  History: [name] is an 44 y.o. male admitted with infection, currently with altered mental status  Medications:  Scheduled: . aspirin  81 mg Oral q morning - 10a  . cefTRIAXone (ROCEPHIN)  IV  1 g Intravenous Q24H  . cloNIDine  0.2 mg Transdermal Weekly  . fluconazole  200 mg Oral QHS  . heparin  5,000 Units Subcutaneous 3 times per day  . insulin aspart  0-15 Units Subcutaneous 6 times per day  . insulin glargine  10 Units Subcutaneous Daily  . levothyroxine  75 mcg Oral QAC breakfast  . metoprolol  5 mg Intravenous 4 times per day  . sodium chloride  3 mL Intravenous Q12H    Conditions of Recording:  This is a 16 channel EEG carried out with the patient in the drowsy state.  Description:  The waking background activity consists predominantly of a low voltage, symmetrical, poorly organized, theta activity with some delta activity mixed in. No posterior dominant rhythm is noted.  A mixture of theta and alpha rhythms are seen from the central and temporal regions.   The patient drowses with slowing to irregular, low voltage theta and beta activity. Normal sleep architecture is not obtained. No focal slowing or epileptiform activity noted. No photic stimulation or hyperventilation noted.    IMPRESSION: Abnormal EEG due to generalized slowing indicating a mild to moderate cerebral disturbance (encephalopathy). No focal slowing or epileptiform activity noted.    Elspeth Cho, DO [facility] [phone]  If 7pm- 7am, please page neurology on call as listed in AMION. 02/05/2015, 11:48 AM

## 2015-02-05 NOTE — Progress Notes (Signed)
Catapres patched applied over RIGHT upper arm. Patch dated. To be changed weekly. See MAR for details.   Rise Paganini, RN

## 2015-02-05 NOTE — Care Management Note (Signed)
    Page 1 of 1   02/05/2015     10:26:12 AM CARE MANAGEMENT NOTE 02/05/2015  Patient:  Taylor [name], Taylor [name]   Account Number:  [id number]  Date Initiated:  02/05/2015  Documentation initiated by:  Junius Creamer  Subjective/Objective Assessment:   adm w dka     Action/Plan:   lives w fam, pcp dr [name]   Anticipated DC Date:     Anticipated DC Plan:  HOME/SELF CARE         Choice offered to / List presented to:             Status of service:   Medicare Important Message given?  YES (If response is "NO", the following Medicare IM given date fields will be blank) Date Medicare IM given:  02/05/2015 Medicare IM given by:  Junius Creamer Date Additional Medicare IM given:   Additional Medicare IM given by:    Discharge Disposition:    Per UR Regulation:  Reviewed for med. necessity/level of care/duration of stay  If discussed at Long Length of Stay Meetings, dates discussed:    Comments:

## 2015-02-05 NOTE — Progress Notes (Signed)
[facility] PROGRESS NOTE  Taylor [name] RKY:[id number] DOB: 06/11/1971 DOA: 02/02/2015  PCP: [name], MD  Brief HPI: 44 year old caucasian male with a past medical history of end-stage renal disease on hemodialysis on Tuesday, Thursday, Saturdays, type one diabetes with diabetic neuropathy, history of coronary artery disease status post CABG, history of penile prosthesis, status post infection with removal of hardware, who presented with a three-day history of nausea and vomiting. He was noted to have significantly elevated blood glucose with evidence for diabetic ketoacidosis. There was also suspicion of infection in his penis. Patient was admitted to the hospital for further management. Patient noted to have penoscrotal abscess. He underwent drainage in the OR.  Past medical history:  Past Medical History  Diagnosis Date  . End stage renal disease on dialysis     LUE fistula  . Type I diabetes mellitus     a. 03/2014 admitted with HNK to [facility].  . Diabetic neuropathy     severe, s/p multiple toe amputation  . Hypothyroidism   . Hypertension   . Hyperlipidemia   . H/O hiatal hernia   . GERD (gastroesophageal reflux disease)   . Anxiety   . Sebaceous cyst     side of neck  . Pneumonia     2010  . Anemia     a. req PRBC's 2011.  [name] PAD (peripheral artery disease)     a. s/p amputation of toes on the right;  b. left LE claudication.  . Coronary artery disease     a. s/p MI;  b. 10/2009 CABG x 3 @ [facility]: LIMA->LAD, VG->OM3, VG->RPDA; c. 11/2010 Cath 3/3 patent grafts;  d 12/2012 Cath: LM 30d, LAD 85p, D1 70, D2 90, LCX 40ost, OM2 100, RCA 90p, 116m, L->LAD ok, VG->OM3 ok, VG->RPDA 30, EF 50%->Med Rx.  . Cataract     right  . Valvular disease     a. 11/2012 Echo: EF 55-60%, mild LVH, mild MR, mild bi-atrial enlargement, mild-mod TR, PASP .  . CHF (congestive heart failure)   . Depression   . Arthritis     rheumatoid arthritis   . Hx of transfusion of packed  red blood cells   . Myocardial infarction 2010  . Anginal pain     Consultants: Nephrology, Urology, Neurology  Procedures:  Hemodialysis TTS  I&D of Penoscrotal Abscess 3/27  EEG Pending  Antibiotics: Cefepime 3/26 Diflucan 3/26 Valtrex 3/26. Stopped on 3/27  Subjective: Patient appears to be less agitated today. Unable to get information from him.  Objective: Vital Signs  Filed Vitals:   02/04/15 1700 02/04/15 1949 02/04/15 2300 02/05/15 0000  BP: 135/54 168/91 174/85 143/69  Pulse: 89 98 88 88  Temp: 98.1 F (36.7 C) 98 F (36.7 C) 98.4 F (36.9 C)   TempSrc: Axillary Oral Oral   Resp: 22 22 21 22   Height:      Weight: 75.6 kg (166 lb 10.7 oz)     SpO2: 100% 100% 100% 93%    Intake/Output Summary (Last 24 hours) at 02/05/15 0728 Last data filed at 02/05/15 0700  Gross per 24 hour  Intake    760 ml  Output   3450 ml  Net  -2690 ml    General appearance: Patient remains confused. Resp: clear to auscultation bilaterally with reduced air entry at the bases. No crackles or wheezing. Cardio: regular rate and rhythm, S1, S2 normal, no murmur, click, rub or gallop GI: soft, non-tender; bowel sounds normal; no masses,  no organomegaly GU: Scrotal area and Penis covered in dressing. Foley noted.  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 Neurologic: less agitated compared to yesterday. But still appears to be delirious. No focal weakness on examination.   Lab Results:  Basic Metabolic Panel:  Recent Labs Lab 02/03/15 1627 02/03/15 2005 02/04/15 0116 02/04/15 0620 02/05/15 0307  NA 136 133* 133* 133* 137  K 3.7 4.5 3.8 3.9 3.8  CL 99 96 95* 97 100  CO2 23 21 23 23 24   GLUCOSE 145* 166* 134* 109* 97  BUN 21 23 24* 25* 14  CREATININE 4.26* 4.40* 4.73* 4.81* 3.58*  CALCIUM 9.1 8.9 9.2 9.0 9.2  PHOS  --   --   --   --  4.0   Liver Function Tests:  Recent Labs Lab 02/02/15 0218 02/02/15 0509 02/04/15 1921 02/05/15 0307    AST 17 24 48*  --   ALT 10 8 17   --   ALKPHOS 282* 244* 241*  --   BILITOT 2.3* 3.2* 1.5*  --   PROT 7.6 7.0 7.0  --   ALBUMIN 3.2* 2.8* 2.8* 2.5*   CBC:  Recent Labs Lab 02/02/15 0218 02/02/15 0509 02/03/15 0323 02/03/15 0751 02/04/15 0620 02/05/15 0307  WBC 14.0* 14.8* 7.8  --  8.7 7.3  NEUTROABS 12.8*  --   --   --   --   --   HGB 11.2* 10.5* 10.1* 11.9* 11.5* 11.3*  HCT 37.1* 34.7* 31.6* 35.0* 36.5* 35.5*  MCV 94.4 94.0 89.0  --  90.3 90.8  PLT 238 210 136*  --  132* 121*   CBG:  Recent Labs Lab 02/04/15 1800 02/04/15 1946 02/04/15 2019 02/04/15 2355 02/05/15 0314  GLUCAP 82 52* 98 80 90    Studies/Results: Ct Head Wo Contrast  02/03/2015   CLINICAL DATA:  Altered mental status recent pelvic surgery  EXAM: CT HEAD WITHOUT CONTRAST  TECHNIQUE: Contiguous axial images were obtained from the base of the skull through the vertex without intravenous contrast.  COMPARISON:  None.  FINDINGS: No skull fracture is noted. Mild mucosal thickening right maxillary sinus. The mastoid air cells are unremarkable. Atherosclerotic calcifications of carotid siphon.  No intracranial hemorrhage, mass effect or midline shift. No intra or extra-axial fluid collection. No acute cortical infarction. No mass lesion is noted on this unenhanced scan.  IMPRESSION: No acute intracranial abnormality. Atherosclerotic calcifications are noted bilateral carotid siphon. No definite acute cortical infarction. Mild mucosal thickening right maxillary sinus.   Electronically Signed   By: 02/07/15 M.D.   On: 02/03/2015 13:52    Medications:  Scheduled: . aspirin  81 mg Oral q morning - 10a  . ceFEPime (MAXIPIME) IV  2 g Intravenous Q T,Th,Sa-HD  . cloNIDine  0.1 mg Transdermal Weekly  . dextrose      . fluconazole  200 mg Oral QHS  . heparin  5,000 Units Subcutaneous 3 times per day  . insulin aspart  0-15 Units Subcutaneous 6 times per day  . insulin glargine  10 Units Subcutaneous Daily  .  levothyroxine  75 mcg Oral QAC breakfast  . metoprolol  5 mg Intravenous 4 times per day  . sodium chloride  3 mL Intravenous Q12H   Continuous: . dextrose 40 mL/hr at 02/04/15 2210   02/06/15 lactate, hydrALAZINE, LORazepam, morphine injection  Assessment/Plan:  Principal Problem:   DKA (diabetic ketoacidoses) Active Problems:   Hypothyroidism   MYOCARDIAL INFARCTION, HX OF   End  stage renal disease 2/2 to DM ty 1-Dialysis started ~2009--Dialyses t/Th/Sat-[place]   S/P CABG (coronary artery bypass graft)   HTN (hypertension)   Penile abscess   Panic attacks   Organic sexual dysfunction   Cellulitis of groin   Sepsis affecting skin   DKA, type 1   Accelerated hypertension   Scrotal abscess   Sepsis   PenoScrotal Abscess with Sepsis (with other GU abnormalities noted on CT) Patient is s/p I&D in the OR on 3/27. Cultures revealed gram-negative rods. Appears to be Escherichia coli sensitive to ceftriaxone. We will change cefepime to ceftriaxone. Continue Diflucan for now. Lactic acid level was improving. At presentation he was noted to have yellowish discharge around his penis. Urology was consulted. A CT scan was done which showed abscesses.   Acute Encephalopathy/Agitation Could be slightly improved today. Has required multiple doses of Ativan and Haldol. QT interval is normal. Agitation still likely due to sepsis. CT scan Head was unremarkable for any acute process. Repeat ABG looked fine as well. TSH was noted to be elevated but free T4 is normal. Neurology is following and we appreciate their input. EEG is pending. According to the family there is no history of alcoholism. He does not consume pain medications on a regular basis. He is on multiple psychotropic agents for history of depression.  Severe DKA (diabetic ketoacidoses) in the setting of type 1 diabetes Appears to have resolved. Initial ABG revealed a pH of 7.1. Patient was initiated on intravenous insulin. He  was changed over to SQ insulin after his anion gap closed. DKA was likely precipitated by penile abscess. HbA1c was 15.4 in February.   Abnormal appearing colon including cecum on CT scan This will need further evaluation once he is more stable from the above issues. There is no history of any diarrhea. We'll need to discuss with the patient regarding any history of endoscopies when he is not agitated.  Hypothyroidism Continue Synthroid. TSH was elevated at 12, but free T4 is normal.  Coronary artery disease status post CABG Stable. Continue aspirin.   End stage renal disease 2/2 to DM 1 on dialysis on Tuesday, Thursday, Saturday Nephrology is following and he is being dialyzed as per them. Unclear reason why patient was on Lasix 80 mg twice a day at home given end-stage renal disease. Holding for now.   Accelerated Hypertension BP remains very high likely because his meds are held along with the fact that he is very agitated. Hydralazine PRN. He is on clonidine patch. Increase the dose. Unable to take meds orally. BB Q6h intravenously.   History of Panic attacks He is on multiple psychotropic agents at home. These are being held due to his alteration in mental status.  Possible shingles involving scalp Patient was started on Valtrex. Discussed with Dr. Arlean Hopping on 3/27. This med can cause confusion. Since we don't have a clear reason for it, will stop for now. Lesions on the scalp not conclusive.  DVT Prophylaxis: Heparin    Code Status: Full code  Family Communication: No family at bedside currently. Discussed with his father today over phone. Disposition Plan: Will remain in stepdown.    LOS: 3 days   [facility]  [facility] Pager [phone] 02/05/2015, 7:28 AM  If 7PM-7AM, please contact night-coverage at [email or web address], password [facility]

## 2015-02-05 NOTE — Evaluation (Signed)
Clinical/Bedside Swallow Evaluation Patient Details  Name: Taylor [name] MRN: [id number] Date of Birth: 04-Aug-1971  Today's Date: 02/05/2015 Time: SLP Start Time (ACUTE ONLY): 0845 SLP Stop Time (ACUTE ONLY): 0855 SLP Time Calculation (min) (ACUTE ONLY): 10 min  Past Medical History:  Past Medical History  Diagnosis Date  . End stage renal disease on dialysis     LUE fistula  . Type I diabetes mellitus     a. 03/2014 admitted with HNK to [facility].  . Diabetic neuropathy     severe, s/p multiple toe amputation  . Hypothyroidism   . Hypertension   . Hyperlipidemia   . H/O hiatal hernia   . GERD (gastroesophageal reflux disease)   . Anxiety   . Sebaceous cyst     side of neck  . Pneumonia     2010  . Anemia     a. req PRBC's 2011.  [name] PAD (peripheral artery disease)     a. s/p amputation of toes on the right;  b. left LE claudication.  . Coronary artery disease     a. s/p MI;  b. 10/2009 CABG x 3 @ [facility]: LIMA->LAD, VG->OM3, VG->RPDA; c. 11/2010 Cath 3/3 patent grafts;  d 12/2012 Cath: LM 30d, LAD 85p, D1 70, D2 90, LCX 40ost, OM2 100, RCA 90p, 124m, L->LAD ok, VG->OM3 ok, VG->RPDA 30, EF 50%->Med Rx.  . Cataract     right  . Valvular disease     a. 11/2012 Echo: EF 55-60%, mild LVH, mild MR, mild bi-atrial enlargement, mild-mod TR, PASP .  . CHF (congestive heart failure)   . Depression   . Arthritis     rheumatoid arthritis   . Hx of transfusion of packed red blood cells   . Myocardial infarction 2010  . Anginal pain    Past Surgical History:  Past Surgical History  Procedure Laterality Date  . Dialysis fistula creation      left upper arm fistula  . Amputation      TOES ON BOTH FEET  . Abscess drainage  Behind right ear/occipital scalp  . Skin graft    . Eye surgery  1999  . Coronary artery bypass graft  10/2009    [facility] (Dr. Katrinka Blazing)  . Foot amputation    . Cardiac catheterization    . Cardiac catheterization  2/14    [facility]: severe 3 vessel CAD with patent  grafts, RHC: moderately elevated PCW and pulmonary hypertension  . Penile prosthesis implant N/A 07/24/2014    Procedure: SALINE PENILE INJECTION WITH DISSECTION OF CORPORA ,  IMPLANTATION OF COLOPLAST PENILE PROTHESIS INFLATABLE;  Surgeon: [name], MD;  Location: [facility] ORS;  Service: Urology;  Laterality: N/A;  . Removal of penile prosthesis N/A 08/22/2014    Procedure: EXPLANT OF INFECTED  PENILE PROSTHESIS;  Surgeon: [name], MD;  Location: [facility] ORS;  Service: Urology;  Laterality: N/A;  . Irrigation and debridement abscess Bilateral 12/14/2014    Procedure: Bilateral Corporal Irrigation with Cultures and Drainage, Penrose Drain Insertion;  Surgeon: [name], MD;  Location: [facility] OR;  Service: Urology;  Laterality: Bilateral;  . Scrotal exploration N/A 02/03/2015    Procedure: IRRIGATION AND DEBRIDEMENT SCROTAL/PENILE ABSCESS;  Surgeon: [name], MD;  Location: [facility] OR;  Service: Urology;  Laterality: N/A;  . Cystoscopy N/A 02/03/2015    Procedure: CYSTOSCOPY;  Surgeon: [name], MD;  Location: [facility] OR;  Service: Urology;  Laterality: N/A;   HPI:  Pt is a 44 y/o male with  PMH of ESRD (since 2010), DM and DM neuropathy, penile prosthesis, s/p infection w/removal of hardware with primary complaint of nausea and vomiting (3 day history). Pt reports he has been throwing up 3-4 per day, unable to consume food for past five days ( from 3/28). MD suspected infection of penis. Pt subsequently underwent irrigation and debridement scrotal/penile abscess cystoscopy. Pt has no prior hx of dysphagia.   Assessment / Plan / Recommendation Clinical Impression   Pt was seen for swallow evaluation. Pt required max verbal and visual cues from SLP during session; pt awake although easily distracted. SLP provided hand over hand assist during PO trials. PO trials across consistencies did no elicit s/s of aspiration; clear vocal quality.Given pt's current mentation and need for max cues,  recommend pt initiate dys 1/ thin liquid diet. Pt should only be fed when pt is awake. Discontinue meal if pt becomes lethargic. Hopeful pt will advance once mentation improves. Speech will follow-up for diet tolerance and possible upgrade.     Aspiration Risk  Mild    Diet Recommendation Dysphagia 1 (Puree);Thin liquid   Liquid Administration via: Cup;Straw Medication Administration: Whole meds with puree Supervision: Staff to assist with self feeding Compensations: Slow rate;Small sips/bites Postural Changes and/or Swallow Maneuvers: Seated upright 90 degrees    Other  Recommendations Oral Care Recommendations: Oral care BID   Follow Up Recommendations  None    Frequency and Duration min 2x/week  2 weeks   Pertinent Vitals/Pain     SLP Swallow Goals     Swallow Study Prior Functional Status       General HPI: Pt is a 44 y/o male with PMH of ESRD (since 2010), DM and DM neuropathy, penile prosthesis, s/p infection w/removal of hardware with primary complaint of nausea and vomiting (3 day history). Pt reports he has been throwing up 3-4 per day, unable to consume food for past five days ( from 3/28). MD suspected infection of penis. Pt subsequently underwent irrigation and debridement scrotal/penile abscess cystoscopy. Pt has no prior hx of dysphagia. Type of Study: Bedside swallow evaluation Temperature Spikes Noted: No Respiratory Status: Nasal cannula History of Recent Intubation: No Oral Cavity - Dentition: Missing dentition (No teeth on top. Teeth on bottom. No dentures.) Self-Feeding Abilities: Total assist (Pt. required hand over hand assist.) Patient Positioning: Upright in bed Baseline Vocal Quality: Clear Volitional Cough: Cognitively unable to elicit Volitional Swallow: Unable to elicit    Oral/Motor/Sensory Function Overall Oral Motor/Sensory Function: Appears within functional limits for tasks assessed Labial ROM: Within Functional Limits Labial Symmetry:  Within Functional Limits Lingual ROM: Other (Comment) (unable to assess) Lingual Symmetry: Within Functional Limits Facial ROM: Within Functional Limits   Ice Chips Ice chips: Not tested   Thin Liquid Thin Liquid: Within functional limits    Nectar Thick Nectar Thick Liquid: Not tested   Honey Thick Honey Thick Liquid: Not tested   Puree Puree: Within functional limits   Solid   GO    Solid: Within functional limits       Bermudez-Bosch, Lorain Fettes 02/05/2015,9:04 AM

## 2015-02-05 NOTE — Progress Notes (Signed)
Comments:   Was ask to evaluate pt for possible transfer to telemetry. Mr Godman has known h/o ESRD on HD (T, Th, Sat), CAD s/p CABG, Type I DM w/ neuropathy and h/o penile prosthesis w/ infection that is s/p removal of same who was admitted 02/02/15 after presenting to ED w/ c/o 3 day h/o n/v. Pt in DKA on admission, suspected penile infection w/ sepsis related to penoscrotal abscess. He underwent I&D in OR on 02/03/15. Also on admission pt w/ acute encephalopathy related to sepsis which has greatly improved. He was transitioned off of insulin drip at approx 0300 02/05/15.  At bedside currently pt is oriented to person and place and is quite cooperative.  He denies any c/o pain. He reports a good appetite. VSS. Telemetry reveals NSR.  AM labs w/o any significant changes. Plan for transfer to telemetry floor tonight. Discussed pt w/ Dr Allena Katz who has reviewed the record and is in agreement w/ plan. Will continue to monitor closely on telemetry.  Leanne Chang, NP-C [facility] Pager [phone]

## 2015-02-05 NOTE — Progress Notes (Signed)
Orders to change dressing twice daily. Dressing changed according to order; Wet-to-dry packing in left hemiscrotum/base of penis. Iodoform gauze into right shaft space.  Rise Paganini, RN

## 2015-02-05 NOTE — Progress Notes (Addendum)
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 Dialysis at the [facility] unit. Now with recurrent L corporal infection at the penile base.   Pt has normal urethra, and still makes enough urine to void each spontaneously each day. Note MRSA NEG consistently; with wound gram stain showing gram NEG Cocci with culture showing E.coli sensitive to ceftriaxone, which was started 3/29. Also on fluconazole for yeast in gram stain.  Interval: original packing from OR removed today and replaced with wet-to-dry. Abscess cavity oozy with purulent fluid. Purulent fluid expressed from urethra around foley, also purulence at right glans at location of prior incision that was opened, drained, and packed. Continues to be afebrile with downtrending WBC (7 today)  Objective: Vital signs in last 24 hours: Temp:  [97.3 F (36.3 C)-98.4 F (36.9 C)] 97.3 F (36.3 C) (03/29 1129) Pulse Rate:  [85-98] 85 (03/29 1615) Resp:  [19-24] 24 (03/29 1615) BP: (129-179)/(54-91) 129/73 mmHg (03/29 1615) SpO2:  [93 %-100 %] 94 % (03/29 1615) Weight:  [75.6 kg (166 lb 10.7 oz)] 75.6 kg (166 lb 10.7 oz) (03/28 1700)  Intake/Output from previous day: 03/28 0701 - 03/29 0700 In: 760 [I.V.:760] Out: 3450 [Urine:50] Intake/Output this shift: Total I/O In: 680 [P.O.:680] Out: -   Physical Exam:  General: depressed mental status c/w encephalopathy Penis: firm, indurated shafts bilaterally. Purulence expressed from incision site on right glans. 3 cm tunnel when probed. Packed  with iodoform gauze. Drain exiting left shaft skin. Purulent fluid around the foley catheter that can be expressed. Scrotum: open I&D site at base of left side of penis/left hemiscrotum. Brown fluid draining. No necrotic tissue noted. Soaked dressing was removed and replaced.   Lab Results: CBC Latest Ref Rng 02/05/2015 02/04/2015 02/03/2015  WBC 4.0 - 10.5 K/uL 7.3 8.7 -  Hemoglobin 13.0 - 17.0 g/dL 11.3(L) 11.5(L) 11.9(L)  Hematocrit 39.0 - 52.0 % 35.5(L) 36.5(L) 35.0(L)  Platelets 150 - 400 K/uL 121(L) 132(L) -    BMP Latest Ref Rng 02/05/2015 02/04/2015 02/04/2015  Glucose 70 - 99 mg/dL 97 505(L) 976(B)  BUN 6 - 23 mg/dL 14 34(L) 93(X)  Creatinine 0.50 - 1.35 mg/dL 9.02(I) 0.97(D) 5.32(D)  BUN/Creat Ratio 9 - 20 - - -  Sodium 135 - 145 mmol/L 137 133(L) 133(L)  Potassium 3.5 - 5.1 mmol/L 3.8 3.9 3.8  Chloride 96 - 112 mmol/L 100 97 95(L)  CO2 19 - 32 mmol/L 24 23 23   Calcium 8.4 - 10.5 mg/dL 9.2 9.0 9.2    No results for input(s): LABPT, INR in the last 72 hours. No results for input(s): LABURIN in the last 72 hours. Results for orders placed or performed during the hospital encounter of 02/02/15  Urine culture     Status: None   Collection Time: 02/02/15  3:57 AM  Result Value Ref Range Status   Specimen Description URINE, RANDOM  Final   Special Requests NONE  Final   Colony Count   Final    >=100,000 COLONIES/ML Performed at 02/04/15    Culture   Final    Multiple bacterial morphotypes present, none predominant. Suggest appropriate recollection  if clinically indicated. Performed at [facility]    Report Status 02/04/2015 FINAL  Final  Stat Gram stain     Status: None   Collection Time: 02/02/15 10:50 AM  Result Value Ref Range Status   Specimen Description PENILE DRAINAGE  Final   Special Requests Immunocompromised  Final   Gram Stain   Final    ABUNDANT WBC PRESENT,BOTH PMN AND MONONUCLEAR ABUNDANT GRAM NEGATIVE COCCI IN DIPLOS Gram Stain Report  Called to,Read Back By and Verified With: A THOMPSON,RN 02/02/15 1207 M [facility]    Report Status 02/02/2015 FINAL  Final  Wound culture     Status: None   Collection Time: 02/02/15 10:50 AM  Result Value Ref Range Status   Specimen Description PENILE DRAINAGE  Final   Special Requests Immunocompromised  Final   Gram Stain   Final    ABUNDANT WBC PRESENT,BOTH PMN AND MONONUCLEAR ABUNDANT GRAM NEGATIVE COCCI Gram Stain Report Called to,Read Back By and Verified With: Gram Stain Report Called to,Read Back By and Verified With: A THOMPSON RN 02/02/15 1207 Jud[facility] Performed at [facility]    Culture   Final    MULTIPLE ORGANISMS PRESENT, NONE PREDOMINANT Note: NO STAPHYLOCOCCUS AUREUS ISOLATED NO GROUP A STREP (S.PYOGENES) ISOLATED Performed at [facility]    Report Status 02/05/2015 FINAL  Final  MRSA PCR Screening     Status: None   Collection Time: 02/02/15  5:13 PM  Result Value Ref Range Status   MRSA by PCR NEGATIVE NEGATIVE Final    Comment:        The GeneXpert MRSA Assay (FDA approved for NASAL specimens only), is one component of a comprehensive MRSA colonization surveillance program. It is not intended to diagnose MRSA infection nor to guide or monitor treatment for MRSA infections.   Surgical pcr screen     Status: Abnormal   Collection Time: 02/03/15  6:27 AM  Result Value Ref Range Status   MRSA, PCR NEGATIVE NEGATIVE Final   Staphylococcus aureus POSITIVE (A) NEGATIVE Final    Comment:        The Xpert SA Assay (FDA approved for NASAL specimens in patients over 1 years of age), is one component of a comprehensive surveillance program.  Test performance has been validated by [facility] for patients greater than or equal to 70 year old. It is not intended to diagnose infection nor to guide or monitor treatment.   Anaerobic culture     Status: None (Preliminary result)   Collection Time: 02/03/15  8:29 AM  Result Value Ref Range  Status   Specimen Description ABSCESS  Final   Special Requests PENIS  Final   Gram Stain   Final    ABUNDANT WBC PRESENT, PREDOMINANTLY PMN RARE SQUAMOUS EPITHELIAL CELLS PRESENT FEW YEAST ABUNDANT GRAM NEGATIVE RODS Performed at [facility]    Culture   Final    NO ANAEROBES ISOLATED; CULTURE IN PROGRESS FOR 5 DAYS Performed at [facility]    Report Status PENDING  Incomplete  Culture, routine-abscess     Status: None   Collection Time: 02/03/15  8:29 AM  Result Value Ref Range Status   Specimen Description ABSCESS  Final   Special Requests PENIS  Final   Gram Stain   Final    MODERATE WBC PRESENT, PREDOMINANTLY PMN RARE SQUAMOUS EPITHELIAL CELLS PRESENT FEW YEAST ABUNDANT GRAM NEGATIVE RODS Performed at [facility]   Final  MODERATE ESCHERICHIA COLI Performed at [facility]    Report Status 02/05/2015 FINAL  Final   Organism ID, Bacteria ESCHERICHIA COLI  Final      Susceptibility   Escherichia coli - MIC*    AMPICILLIN >=32 RESISTANT Resistant     AMPICILLIN/SULBACTAM 16 INTERMEDIATE Intermediate     CEFAZOLIN <=4 SENSITIVE Sensitive     CEFEPIME <=1 SENSITIVE Sensitive     CEFTAZIDIME <=1 SENSITIVE Sensitive     CEFTRIAXONE <=1 SENSITIVE Sensitive     CIPROFLOXACIN >=4 RESISTANT Resistant     GENTAMICIN <=1 SENSITIVE Sensitive     IMIPENEM <=0.25 SENSITIVE Sensitive     PIP/TAZO <=4 SENSITIVE Sensitive     TOBRAMYCIN <=1 SENSITIVE Sensitive     TRIMETH/SULFA >=320 RESISTANT Resistant     * MODERATE ESCHERICHIA COLI    Studies/Results: No results found.  Assessment/Plan: Improvement with aggressive I/S and cefepime. Now changed to CTX given E. Coli with c/s. Agree with continued fluconazole. Will leave drains. Now concerned for urethral involvement. WIll continue to follow and decide whether cystoscopy is necessary.  Change wet-to-dry dressings BID at base of penis as well as iodoform gauze into right shaft  space BID.   LOS: 3 days   Laural Benes, [place] C 02/05/2015, 4:30 PM   Discussed with Dr. Berneice Heinrich & Patsi Sears. Pt had urethral drainage previously and had a cystoscopy at the time of his I&D.

## 2015-02-05 NOTE — Progress Notes (Signed)
[facility] Progress Note   Subjective:  Tolerated HD yesterday Weights 77.9 pre/75.6 post (prev EDW 80) and still has edema. Seems more coherent today Up in the chair  Filed Vitals:   02/04/15 2300 02/05/15 0000 02/05/15 0825 02/05/15 1129  BP: 174/85 143/69 163/83 179/86  Pulse: 88 88 89 91  Temp: 98.4 F (36.9 C)   97.3 F (36.3 C)  TempSrc: Oral   Oral  Resp: 21 22 21 19   Height:      Weight:      SpO2: 100% 93% 100% 100%   Exam: BP 179/86 mmHg  Pulse 91  Temp(Src) 97.3 F (36.3 C) (Oral)  Resp 19  Ht 5\' 8"  (1.727 m)  Wt 75.6 kg (166 lb 10.7 oz)  BMI 25.35 kg/m2  SpO2 100%  No jvd Chest clear bilaterally  S1S2 No S3 no MRG Abd soft, NTND, no mass or ascites GU large dressing over penis/ scrotum, drain and foley in place; not much urine in the tubing 1+ LE edema pretib bilat with scabs/excoriation over right pre-tib region. Right TMA, absent L great toe, scabs RLE Neuro is awake, not esp alert, confused but better than yesterday Left upper AVF  + bruit and thrill  HD: TTS [facility] 3h  80kg   Heparin none   L arm AVF Will have lower EDW at discharge   Assessment/Recommendations  1. Penile/ scrotal abcess, s/p I&D in the OR. Drainage from penis - gm stain read as Gm neg cocci; Gm stain from the OR - Gm neg rods and a few yeast. Culture + EColi. Now on rocephin 2. ESRD on HD - normal schedule TTS. Last HD 3/28 (Monday) so is off schedule.  Will HD again tomorrow, plan to get him back on correct schedule before hospital discharge.  He will have a lower EDW at discharge.  Edema is looking better. 3. DKA / DM 1 - back on SQ insulin 4. HTN on catapres patch TTS1 and IV metoprolol QID (?) 5. Volume -  +vol excess w LE edema - better as we lower EDW 6. PAD hx toe amps and TMA 7. 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 Hx penile abcess complicating penile implant placement  07/2014; implant removed Oct '15  9. CAD prior CABG 10. Hypothyroidism on replacement 11. AMS - ? Presume d/t infection?  Appears well dialyzed so not likely uremic encephalopathy. I don't know his baseline but reportedly way off as he drives himself to HD by his report.  Seems better today. Neuro evaluating.  08/2014, MD [facility] [phone] Pager 02/05/2015, 1:45 PM     Recent Labs Lab 02/04/15 0116 02/04/15 0620 02/05/15 0307  NA 133* 133* 137  K 3.8 3.9 3.8  CL 95* 97 100  CO2 23 23 24   GLUCOSE 134* 109* 97  BUN 24* 25* 14  CREATININE 4.73* 4.81* 3.58*  CALCIUM 9.2 9.0 9.2  PHOS  --   --  4.0    Recent Labs Lab 02/02/15 0218 02/02/15 0509 02/04/15 1921 02/05/15 0307  AST 17 24 48*  --   ALT 10 8 17   --   ALKPHOS 282* 244* 241*  --   BILITOT 2.3* 3.2* 1.5*  --   PROT 7.6 7.0 7.0  --   ALBUMIN 3.2* 2.8* 2.8* 2.5*    Recent Labs Lab 02/02/15 02/06/15  02/03/15 02/03/15 0751 02/04/15 02/05/15  02/05/15 0307  WBC 14.0*  < > 7.8  --  8.7 7.3  NEUTROABS 12.8*  --   --   --   --   --   HGB 11.2*  < > 10.1* 11.9* 11.5* 11.3*  HCT 37.1*  < > 31.6* 35.0* 36.5* 35.5*  MCV 94.4  < > 89.0  --  90.3 90.8  PLT 238  < > 136*  --  132* 121*  < > = values in this interval not displayed. Medications . aspirin  81 mg Oral q morning - 10a  . cefTRIAXone (ROCEPHIN)  IV  1 g Intravenous Q24H  . cloNIDine  0.2 mg Transdermal Weekly  . fluconazole  200 mg Oral QHS  . heparin  5,000 Units Subcutaneous 3 times per day  . insulin aspart  0-15 Units Subcutaneous 6 times per day  . insulin glargine  10 Units Subcutaneous Daily  . levothyroxine  75 mcg Oral QAC breakfast  . metoprolol  5 mg Intravenous 4 times per day  . sodium chloride  3 mL Intravenous Q12H   . dextrose 40 mL/hr at 02/04/15 2210   haloperidol lactate, hydrALAZINE, LORazepam, morphine injection

## 2015-02-05 NOTE — Progress Notes (Signed)
EEG Completed; Results Pending  

## 2015-02-05 NOTE — Progress Notes (Signed)
Subjective: Patient not as agitated this morning but remains confused.    Objective: Current vital signs: BP 143/69 mmHg  Pulse 88  Temp(Src) 98.4 F (36.9 C) (Oral)  Resp 22  Ht _0  (1.727 m)  Wt 75.6 kg (166 lb 10.7 oz)  BMI 25.35 kg/m2  SpO2 93% Vital signs in last 24 hours: Temp:  [96.3 F (35.7 C)-98.4 F (36.9 C)] 98.4 F (36.9 C) (03/28 2300) Pulse Rate:  [84-103] 88 (03/29 0000) Resp:  [20-30] 22 (03/29 0000) BP: (135-197)/(54-105) 143/69 mmHg (03/29 0000) SpO2:  [93 %-100 %] 93 % (03/29 0000) Weight:  [75.6 kg (166 lb 10.7 oz)-77.9 kg (171 lb 11.8 oz)] 75.6 kg (166 lb 10.7 oz) (03/28 1700)  Intake/Output from previous day: 03/28 0701 - 03/29 0700 In: 760 [I.V.:760] Out: 3450 [Urine:50] Intake/Output this shift: Total I/O In: 440 [P.O.:440] Out: -  Nutritional status: DIET - DYS 1 Room service appropriate?: Yes; Fluid consistency:: Thin  Neurologic Exam: Mental Status: Alert. Follows simple commands. When asked where he is he repots that he is in dialysis. Says it is 2008.  Does not respond to any other orientation questions. Speech fluent without evidence of aphasia.  Cranial Nerves: II: Discs flat bilaterally; Does not blink to bilateral confrontation, pupils equal, round, reactive to light and accommodation III,IV, VI: ptosis not present, extra-ocular motions intact bilaterally V,VII: smile symmetric, facial light touch sensation normal bilaterally VIII: hearing normal bilaterally IX,X: gag reflex present XI: bilateral shoulder shrug XII: midline tongue extension Motor: Moves all extremities against gravity and to command. No focal weakness noted.  Sensory: Appreciates light touch in all extremities.  Deep Tendon Reflexes: 2+ in the upper extremities and absent in the lower extremities.  Plantars: Unable to assess due to bilateral toe amputations    Lab Results: Basic Metabolic Panel:  Recent Labs Lab 02/03/15 1627 02/03/15 2005  02/04/15 0116 02/04/15 0620 02/05/15 0307  NA 136 133* 133* 133* 137  K 3.7 4.5 3.8 3.9 3.8  CL 99 96 95* 97 100  CO2 _1 GLUCOSE 145* 166* 134* 109* 97  BUN 21 23 24* 25* 14  CREATININE 4.26* 4.40* 4.73* 4.81* 3.58*  CALCIUM 9.1 8.9 9.2 9.0 9.2  PHOS  --   --   --   --  4.0    Liver Function Tests:  Recent Labs Lab 02/02/15 0218 02/02/15 0509 02/04/15 1921 02/05/15 0307  AST 17 24 48*  --   ALT _2 --   ALKPHOS 282* 244* 241*  --   BILITOT 2.3* 3.2* 1.5*  --   PROT 7.6 7.0 7.0  --   ALBUMIN 3.2* 2.8* 2.8* 2.5*   No results for input(s): LIPASE, AMYLASE in the last 168 hours. No results for input(s): AMMONIA in the last 168 hours.  CBC:  Recent Labs Lab 02/02/15 0218 02/02/15 0509 02/03/15 0323 02/03/15 0751 02/04/15 0620 02/05/15 0307  WBC 14.0* 14.8* 7.8  --  8.7 7.3  NEUTROABS 12.8*  --   --   --   --   --   HGB 11.2* 10.5* 10.1* 11.9* 11.5* 11.3*  HCT 37.1* 34.7* 31.6* 35.0* 36.5* 35.5*  MCV 94.4 94.0 89.0  --  90.3 90.8  PLT 238 210 136*  --  132* 121*    Cardiac Enzymes: No results for input(s): CKTOTAL, CKMB, CKMBINDEX, TROPONINI in the last 168 hours.  Lipid Panel: No results for input(s): CHOL, TRIG, HDL, CHOLHDL, VLDL, LDLCALC in  the last 168 hours.  CBG:  Recent Labs Lab 02/04/15 1946 02/04/15 2019 02/04/15 2355 02/05/15 0314 02/05/15 0754  GLUCAP 52* 98 80 90 93    Microbiology: Results for orders placed or performed during the hospital encounter of 02/02/15  Urine culture     Status: None   Collection Time: 02/02/15  3:57 AM  Result Value Ref Range Status   Specimen Description URINE, RANDOM  Final   Special Requests NONE  Final   Colony Count   Final    >=100,000 COLONIES/ML Performed at [facility]    Culture   Final    Multiple bacterial morphotypes present, none predominant. Suggest appropriate recollection if clinically indicated. Performed at [facility]    Report Status  02/04/2015 FINAL  Final  Stat Gram stain     Status: None   Collection Time: 02/02/15 10:50 AM  Result Value Ref Range Status   Specimen Description PENILE DRAINAGE  Final   Special Requests Immunocompromised  Final   Gram Stain   Final    ABUNDANT WBC PRESENT,BOTH PMN AND MONONUCLEAR ABUNDANT GRAM NEGATIVE COCCI IN DIPLOS Gram Stain Report Called to,Read Back By and Verified With: A THOMPSON,RN 02/02/15 1207 M [facility]    Report Status 02/02/2015 FINAL  Final  Wound culture     Status: None   Collection Time: 02/02/15 10:50 AM  Result Value Ref Range Status   Specimen Description PENILE DRAINAGE  Final   Special Requests Immunocompromised  Final   Gram Stain   Final    ABUNDANT WBC PRESENT,BOTH PMN AND MONONUCLEAR ABUNDANT GRAM NEGATIVE COCCI Gram Stain Report Called to,Read Back By and Verified With: Gram Stain Report Called to,Read Back By and Verified With: A THOMPSON RN 02/02/15 1207 Jerilynn Mages [facility] Performed at [facility]    Culture   Final    MULTIPLE ORGANISMS PRESENT, NONE PREDOMINANT Note: NO STAPHYLOCOCCUS AUREUS ISOLATED NO GROUP A STREP (S.PYOGENES) ISOLATED Performed at [facility]    Report Status 02/05/2015 FINAL  Final  MRSA PCR Screening     Status: None   Collection Time: 02/02/15  5:13 PM  Result Value Ref Range Status   MRSA by PCR NEGATIVE NEGATIVE Final    Comment:        The GeneXpert MRSA Assay (FDA approved for NASAL specimens only), is one component of a comprehensive MRSA colonization surveillance program. It is not intended to diagnose MRSA infection nor to guide or monitor treatment for MRSA infections.   Surgical pcr screen     Status: Abnormal   Collection Time: 02/03/15  6:27 AM  Result Value Ref Range Status   MRSA, PCR NEGATIVE NEGATIVE Final   Staphylococcus aureus POSITIVE (A) NEGATIVE Final    Comment:        The Xpert SA Assay (FDA approved for NASAL specimens in patients over 26 years of age), is one component  of a comprehensive surveillance program.  Test performance has been validated by [facility] for patients greater than or equal to 70 year old. It is not intended to diagnose infection nor to guide or monitor treatment.   Anaerobic culture     Status: None (Preliminary result)   Collection Time: 02/03/15  8:29 AM  Result Value Ref Range Status   Specimen Description ABSCESS  Final   Special Requests PENIS  Final   Gram Stain   Final    ABUNDANT WBC PRESENT, PREDOMINANTLY PMN RARE SQUAMOUS EPITHELIAL CELLS PRESENT FEW YEAST ABUNDANT  GRAM NEGATIVE RODS Performed at [facility]    Culture   Final    NO ANAEROBES ISOLATED; CULTURE IN PROGRESS FOR 5 DAYS Performed at [facility]    Report Status PENDING  Incomplete  Culture, routine-abscess     Status: None   Collection Time: 02/03/15  8:29 AM  Result Value Ref Range Status   Specimen Description ABSCESS  Final   Special Requests PENIS  Final   Gram Stain   Final    MODERATE WBC PRESENT, PREDOMINANTLY PMN RARE SQUAMOUS EPITHELIAL CELLS PRESENT FEW YEAST ABUNDANT GRAM NEGATIVE RODS Performed at [facility]    Culture   Final    MODERATE ESCHERICHIA COLI Performed at [facility]    Report Status 02/05/2015 FINAL  Final   Organism ID, Bacteria ESCHERICHIA COLI  Final      Susceptibility   Escherichia coli - MIC*    AMPICILLIN >=32 RESISTANT Resistant     AMPICILLIN/SULBACTAM 16 INTERMEDIATE Intermediate     CEFAZOLIN <=4 SENSITIVE Sensitive     CEFEPIME <=1 SENSITIVE Sensitive     CEFTAZIDIME <=1 SENSITIVE Sensitive     CEFTRIAXONE <=1 SENSITIVE Sensitive     CIPROFLOXACIN >=4 RESISTANT Resistant     GENTAMICIN <=1 SENSITIVE Sensitive     IMIPENEM <=0.25 SENSITIVE Sensitive     PIP/TAZO <=4 SENSITIVE Sensitive     TOBRAMYCIN <=1 SENSITIVE Sensitive     TRIMETH/SULFA >=320 RESISTANT Resistant     * MODERATE ESCHERICHIA COLI    Coagulation Studies: No results for input(s):  LABPROT, INR in the last 72 hours.  Imaging: Ct Head Wo Contrast  02/03/2015   CLINICAL DATA:  Altered mental status recent pelvic surgery  EXAM: CT HEAD WITHOUT CONTRAST  TECHNIQUE: Contiguous axial images were obtained from the base of the skull through the vertex without intravenous contrast.  COMPARISON:  None.  FINDINGS: No skull fracture is noted. Mild mucosal thickening right maxillary sinus. The mastoid air cells are unremarkable. Atherosclerotic calcifications of carotid siphon.  No intracranial hemorrhage, mass effect or midline shift. No intra or extra-axial fluid collection. No acute cortical infarction. No mass lesion is noted on this unenhanced scan.  IMPRESSION: No acute intracranial abnormality. Atherosclerotic calcifications are noted bilateral carotid siphon. No definite acute cortical infarction. Mild mucosal thickening right maxillary sinus.   Electronically Signed   By: Lahoma Crocker M.D.   On: 02/03/2015 13:52    Medications:  I have reviewed the patient's current medications. Scheduled: . aspirin  81 mg Oral q morning - 10a  . ceFEPime (MAXIPIME) IV  2 g Intravenous Q T,Th,Sa-HD  . cloNIDine  0.1 mg Transdermal Weekly  . fluconazole  200 mg Oral QHS  . heparin  5,000 Units Subcutaneous 3 times per day  . insulin aspart  0-15 Units Subcutaneous 6 times per day  . insulin glargine  10 Units Subcutaneous Daily  . levothyroxine  75 mcg Oral QAC breakfast  . metoprolol  5 mg Intravenous 4 times per day  . sodium chloride  3 mL Intravenous Q12H    Assessment/Plan: Patient remains confused.  EEG pending.  Hepatic function panel shows an elevated alk phos and AST but total bili is improved.  Patient remains on Cefepime.  Cefepime can cause mental status changes and should be considered as a possible contributing factor.  Recommendations: 1.  Will follow up results of EEG 2.  Would discuss other possible antibiotic options.   3.  Will continue to follow  with you   LOS: 3 days    Alexis Goodell, MD [facility] [phone] 02/05/2015  9:32 AM

## 2015-02-06 DIAGNOSIS — I150 Renovascular hypertension: Secondary | ICD-10-CM

## 2015-02-06 DIAGNOSIS — E871 Hypo-osmolality and hyponatremia: Secondary | ICD-10-CM

## 2015-02-06 DIAGNOSIS — R410 Disorientation, unspecified: Secondary | ICD-10-CM | POA: Diagnosis present

## 2015-02-06 LAB — RENAL FUNCTION PANEL
Albumin: 2.5 g/dL — ABNORMAL LOW (ref 3.5–5.2)
Anion gap: 17 — ABNORMAL HIGH (ref 5–15)
BUN: 24 mg/dL — ABNORMAL HIGH (ref 6–23)
CALCIUM: 9.5 mg/dL (ref 8.4–10.5)
CHLORIDE: 91 mmol/L — AB (ref 96–112)
CO2: 22 mmol/L (ref 19–32)
Creatinine, Ser: 4.68 mg/dL — ABNORMAL HIGH (ref 0.50–1.35)
GFR calc Af Amer: 16 mL/min — ABNORMAL LOW (ref >90)
GFR, EST NON AFRICAN AMERICAN: 14 mL/min — AB (ref >90)
GLUCOSE: 70 mg/dL (ref 70–99)
PHOSPHORUS: 4.7 mg/dL — AB (ref 2.3–4.6)
POTASSIUM: 4.2 mmol/L (ref 3.5–5.1)
Sodium: 130 mmol/L — ABNORMAL LOW (ref 135–145)

## 2015-02-06 LAB — CBC
HCT: 33.8 % — ABNORMAL LOW (ref 39.0–52.0)
Hemoglobin: 11 g/dL — ABNORMAL LOW (ref 13.0–17.0)
MCH: 28.3 pg (ref 26.0–34.0)
MCHC: 32.5 g/dL (ref 30.0–36.0)
MCV: 86.9 fL (ref 78.0–100.0)
Platelets: 177 10*3/uL (ref 150–400)
RBC: 3.89 MIL/uL — AB (ref 4.22–5.81)
RDW: 15.8 % — ABNORMAL HIGH (ref 11.5–15.5)
WBC: 11.6 10*3/uL — ABNORMAL HIGH (ref 4.0–10.5)

## 2015-02-06 LAB — GLUCOSE, CAPILLARY
GLUCOSE-CAPILLARY: 190 mg/dL — AB (ref 70–99)
GLUCOSE-CAPILLARY: 293 mg/dL — AB (ref 70–99)
Glucose-Capillary: 53 mg/dL — ABNORMAL LOW (ref 70–99)
Glucose-Capillary: 56 mg/dL — ABNORMAL LOW (ref 70–99)
Glucose-Capillary: 94 mg/dL (ref 70–99)
Glucose-Capillary: 269 mg/dL — ABNORMAL HIGH (ref 70–99)

## 2015-02-06 MED ORDER — CEFTRIAXONE SODIUM IN DEXTROSE 40 MG/ML IV SOLN
2.0000 g | INTRAVENOUS | Status: DC
  Administered 2015-02-07 – 2015-02-08 (×2): 2 g via INTRAVENOUS
  Filled 2015-02-06 (×2): qty 50

## 2015-02-06 MED ORDER — QUETIAPINE FUMARATE 25 MG PO TABS
25.0000 mg | ORAL_TABLET | Freq: Two times a day (BID) | ORAL | Status: DC
Start: 2015-02-06 — End: 2015-02-09
  Administered 2015-02-06 – 2015-02-08 (×6): 25 mg via ORAL
  Filled 2015-02-06 (×8): qty 1

## 2015-02-06 NOTE — Progress Notes (Signed)
Transfer Notes Patient transferred from 2 H to floor, via bed, transported by RN and a Recruitment consultant. Patient is alert and oriented x 3. Denies any pain, Right forearm IV saline locked.  Placed on tele box 21, CCMD notified. 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 Healing stage 2 pressure ulcer present on sacrum (foam dressing applied). Bed placed in the lowest position, call bell within reach bed alarm activated and sitter at bedside. Will continue to monitor.

## 2015-02-06 NOTE — Progress Notes (Signed)
Inpatient Diabetes Program Recommendations  AACE/ADA: New Consensus Statement on Inpatient Glycemic Control (2013)  Target Ranges:  Prepandial:   less than 140 mg/dL      Peak postprandial:   less than 180 mg/dL (1-2 hours)      Critically ill patients:  140 - 180 mg/dL     Results for Taylor [name], Taylor [name] (MRN [id number]) as of 02/06/2015 09:26  Ref. Range 02/05/2015 07:54 02/05/2015 11:29 02/05/2015 16:15 02/05/2015 20:32 02/05/2015 22:45  Glucose-Capillary Latest Range: 70-99 mg/dL 93 320 (H) 233 (H) 435 (H) 190 (H)     Admit with: DKA  History: DM Type 1, ESRD, HTN, CAD, CHF  Home DM Meds: Lantus 30 units daily  Novolog 7-17 units tid per SSI  Current DM Orders: Lantus 10 units daily  Novolog Moderate SSI tid    **Patient getting dialysis today.  **Having elevated postprandial glucose levels after meals- Eating 50-70% of meals.    MD- Please consider adding low dose Novolog Meal Coverage-  Novolog 3 units tid with meals (hold of pt eats <50% of meal, hold if pt NPO)     Will follow Ambrose Finland RN, MSN, CDE Diabetes Coordinator Inpatient Diabetes Program Team Pager: [phone] (8a-5p)

## 2015-02-06 NOTE — Progress Notes (Signed)
Hemodialysis- Pt tolerating treatment well. Becoming restless, pulling at telemetry leads and hemo lines. Soft wrist restraint applied per order to L Arm and mitten applied to R Hand to prevent possible injury to access. Continue to monitor. Situation explained to pt and he is agreeable to the wrist restraint.

## 2015-02-06 NOTE — Procedures (Signed)
I have personally attended this patient's dialysis session.   Left AVF 400 Goal 2 liters Lowering EDW 4K bath pending labs (not back yet) Tight heparin  Camille Bal, MD [facility] [phone] Pager 02/06/2015, 7:30 AM

## 2015-02-06 NOTE — Progress Notes (Addendum)
Patient bed alarm alarming, patient found on floor by Roxanne Mins, RN. Patient stated he was trying to call someone to take him to the bathroom.  Call bell and phone were in the patient's bed within reach.   Patient placed back in bed via hoyer lift.  Patient denies any pain.  Skin tear to right knee, pink foam placed. BP 159/96. JR 87, T 97.7, 96% on room air, R 16. Fall huddle completed, MD notified. Message left at home number for father to return call.  Will continue to monitor.

## 2015-02-06 NOTE — Progress Notes (Addendum)
Speech Language Pathology Treatment: Dysphagia  Patient Details Name: Taylor [name] MRN: [id number] DOB: February 08, 1971 Today's Date: 02/06/2015 Time: 1450-1510 SLP Time Calculation (min) (ACUTE ONLY): 20 min  Assessment / Plan / Recommendation Clinical Impression  Dysphagia treatment provided today for diet tolerance/ advancement, compensatory strategy training, and pt/ caregiver education. Pt much more alert today compared to reports from 3/29. Able to tell name, month and year. Able to feed self independently, requiring mod cues to take small bites/ sips- continues to be impulsive. Pt had a delayed cough x1 following cracker; also prolonged mastication with cracker. Risk of aspiration is decreasing as cognitive status improves. Recommend upgrading diet to dysphagia 3, thin liquids, meds whole in puree, full supervision to cue for small bites/ sips- allow pt to self-feed. Will continue to follow for diet tolerance/ advancement.   HPI HPI: Pt is a 44 y/o male with PMH of ESRD (since 2010), DM and DM neuropathy, penile prosthesis, s/p infection w/removal of hardware with primary complaint of nausea and vomiting (3 day history). Pt reports he has been throwing up 3-4 per day, unable to consume food for past five days ( from 3/28). MD suspected infection of penis. Pt subsequently underwent irrigation and debridement scrotal/penile abscess cystoscopy. Pt has no prior hx of dysphagia.   Pertinent Vitals Pain Assessment: No/denies pain  SLP Plan  Continue with current plan of care    Recommendations Diet recommendations: Dysphagia 3 (mechanical soft);Thin liquid Liquids provided via: Cup;Straw Medication Administration: Whole meds with puree Supervision: Staff to assist with self feeding;Full supervision/cueing for compensatory strategies Compensations: Slow rate;Small sips/bites Postural Changes and/or Swallow Maneuvers: Seated upright 90 degrees              Oral Care Recommendations:  Oral care BID Follow up Recommendations: None Plan: Continue with current plan of care    GO     Metro Kung, MA, CCC-SLP 02/06/2015, 3:21 PM  [phone]

## 2015-02-06 NOTE — Progress Notes (Signed)
Hemodialysis= Soft wrist restraint discontinued. Mittens off. Pt cooperative and calm. Sitter at bedside

## 2015-02-06 NOTE — Progress Notes (Signed)
Hypoglycemic Event  CBG: 53  Treatment: carb snack  Symptoms: none  Follow-up CBG: Time:11:50 CBG Result: 56  Possible Reasons for Event: unknown  Comments/MD notified: yes    [place], Lavetta Nielsen  Remember to initiate Hypoglycemia Order Set & complete

## 2015-02-06 NOTE — Progress Notes (Signed)
[facility] PROGRESS NOTE  Taylor [name] LFY:[id number] DOB: 1971/07/12 DOA: 02/02/2015  PCP: [name], MD  Brief HPI: 44 year old caucasian male with a past medical history of end-stage renal disease on hemodialysis on Tuesday, Thursday, Saturdays, type one diabetes with diabetic neuropathy, history of coronary artery disease status post CABG, history of penile prosthesis, status post infection with removal of hardware, who presented with a three-day history of nausea and vomiting. He was noted to have significantly elevated blood glucose with evidence for diabetic ketoacidosis. There was also suspicion of infection in his penis. Patient was admitted to the hospital for further management. Patient noted to have penoscrotal abscess. He underwent drainage in the OR.  Past medical history:  Past Medical History  Diagnosis Date  . End stage renal disease on dialysis     LUE fistula  . Type I diabetes mellitus     a. 03/2014 admitted with HNK to [facility].  . Diabetic neuropathy     severe, s/p multiple toe amputation  . Hypothyroidism   . Hypertension   . Hyperlipidemia   . H/O hiatal hernia   . GERD (gastroesophageal reflux disease)   . Anxiety   . Sebaceous cyst     side of neck  . Pneumonia     2010  . Anemia     a. req PRBC's 2011.  [name] PAD (peripheral artery disease)     a. s/p amputation of toes on the right;  b. left LE claudication.  . Coronary artery disease     a. s/p MI;  b. 10/2009 CABG x 3 @ [facility]: LIMA->LAD, VG->OM3, VG->RPDA; c. 11/2010 Cath 3/3 patent grafts;  d 12/2012 Cath: LM 30d, LAD 85p, D1 70, D2 90, LCX 40ost, OM2 100, RCA 90p, 183m, L->LAD ok, VG->OM3 ok, VG->RPDA 30, EF 50%->Med Rx.  . Cataract     right  . Valvular disease     a. 11/2012 Echo: EF 55-60%, mild LVH, mild MR, mild bi-atrial enlargement, mild-mod TR, PASP .  . CHF (congestive heart failure)   . Depression   . Arthritis     rheumatoid arthritis   . Hx of transfusion of packed  red blood cells   . Myocardial infarction 2010  . Anginal pain     Consultants: Nephrology, Urology, Neurology  Procedures:  Hemodialysis TTS  I&D of Penoscrotal Abscess 3/27  EEG Pending  Antibiotics: Cefepime 3/26 Diflucan 3/26 Valtrex 3/26. Stopped on 3/27  Subjective: Patient appears to be less agitated today.  oriented but looks lethargic  Objective: Vital Signs  Filed Vitals:   02/06/15 1608 02/06/15 1630 02/06/15 1654 02/06/15 1701  BP: 153/79 159/96 154/67 159/87  Pulse: 78 86 90 85  Temp: 97.5 F (36.4 C) 97.7 F (36.5 C) 97.5 F (36.4 C) 97.5 F (36.4 C)  TempSrc: Oral Oral Oral Oral  Resp: 17 18 17 18   Height:      Weight:      SpO2: 95% 96% 92% 100%    Intake/Output Summary (Last 24 hours) at 02/06/15 1708 Last data filed at 02/06/15 1417  Gross per 24 hour  Intake   1120 ml  Output   1910 ml  Net   -790 ml    General appearance: Patient looks lethargic, somnolent Resp: clear to auscultation bilaterally with reduced air entry at the bases. No crackles or wheezing. Cardio: regular rate and rhythm, S1, S2 normal, no murmur, click, rub or gallop GI: soft, non-tender; bowel sounds normal; no masses,  no  organomegaly GU: Scrotal area and Penis covered in dressing. Foley noted.  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 Neurologic: less agitated compared to yesterday. But still appears to be delirious. No focal weakness on examination.   Lab Results:  Basic Metabolic Panel:  Recent Labs Lab 02/03/15 2005 02/04/15 0116 02/04/15 0620 02/05/15 0307 02/06/15 0722  NA 133* 133* 133* 137 130*  K 4.5 3.8 3.9 3.8 4.2  CL 96 95* 97 100 91*  CO2 21 23 23 24 22   GLUCOSE 166* 134* 109* 97 70  BUN 23 24* 25* 14 24*  CREATININE 4.40* 4.73* 4.81* 3.58* 4.68*  CALCIUM 8.9 9.2 9.0 9.2 9.5  PHOS  --   --   --  4.0 4.7*   Liver Function Tests:  Recent Labs Lab 02/02/15 0218 02/02/15 0509 02/04/15 1921 02/05/15 0307  02/06/15 0722  AST 17 24 48*  --   --   ALT 10 8 17   --   --   ALKPHOS 282* 244* 241*  --   --   BILITOT 2.3* 3.2* 1.5*  --   --   PROT 7.6 7.0 7.0  --   --   ALBUMIN 3.2* 2.8* 2.8* 2.5* 2.5*   CBC:  Recent Labs Lab 02/02/15 0218 02/02/15 0509 02/03/15 0323 02/03/15 0751 02/04/15 0620 02/05/15 0307 02/06/15 0721  WBC 14.0* 14.8* 7.8  --  8.7 7.3 11.6*  NEUTROABS 12.8*  --   --   --   --   --   --   HGB 11.2* 10.5* 10.1* 11.9* 11.5* 11.3* 11.0*  HCT 37.1* 34.7* 31.6* 35.0* 36.5* 35.5* 33.8*  MCV 94.4 94.0 89.0  --  90.3 90.8 86.9  PLT 238 210 136*  --  132* 121* 177   CBG:  Recent Labs Lab 02/05/15 2032 02/05/15 2245 02/06/15 1118 02/06/15 1150 02/06/15 1232  GLUCAP 221* 190* 53* 56* 94    Studies/Results: No results found.  Medications:  Scheduled: . aspirin  81 mg Oral q morning - 10a  . [START ON 02/07/2015] cefTRIAXone (ROCEPHIN)  IV  2 g Intravenous Q24H  . cloNIDine  0.2 mg Transdermal Weekly  . fluconazole  200 mg Oral QHS  . heparin  5,000 Units Subcutaneous 3 times per day  . insulin aspart  0-15 Units Subcutaneous TID WC  . insulin glargine  10 Units Subcutaneous Daily  . levothyroxine  75 mcg Oral QAC breakfast  . metoprolol  5 mg Intravenous 4 times per day  . QUEtiapine  25 mg Oral BID  . sodium chloride  3 mL Intravenous Q12H   Continuous:   02/08/15  Assessment/Plan:  Principal Problem:   DKA (diabetic ketoacidoses) Active Problems:   Hypothyroidism   MYOCARDIAL INFARCTION, HX OF   End stage renal disease 2/2 to DM ty 1-Dialysis started ~2009--Dialyses t/Th/Sat-[place]   S/P CABG (coronary artery bypass graft)   HTN (hypertension)   Penile abscess   Panic attacks   Organic sexual dysfunction   Cellulitis of groin   Sepsis affecting skin   DKA, type 1   Accelerated hypertension   Scrotal abscess   Sepsis   Altered mental state   PenoScrotal Abscess with Sepsis (with other GU abnormalities noted on CT) Patient is  s/p I&D in the OR on 3/27. Cultures revealed  Escherichia coli sensitive to ceftriaxone. Change to to ceftriaxone. Continue Diflucan for now. Lactic acid level was improving. At presentation he was noted to have yellowish discharge around his penis. Urology  was consulted. A CT scan was done which showed abscesses.   Acute Encephalopathy metabolic/Agitation Could be slightly improved today. Discontinued Ativan and Haldol. QT interval is normal. Started on Seroquel. CT scan Head was unremarkable for any acute process. Repeat ABG looked fine as well. TSH was noted to be elevated but free T4 is normal. Neurology is following and we appreciate their input. EEG is pending. According to the family there is no history of alcoholism. He does not consume pain medications on a regular basis. He is on multiple psychotropic agents for history of depression.  Severe DKA (diabetic ketoacidoses) in the setting of type 1 diabetes Appears to have resolved. Initial ABG revealed a pH of 7.1. Patient was initiated on intravenous insulin. He was changed over to SQ insulin after his anion gap closed. DKA was likely precipitated by penile abscess. HbA1c was 15.4 in February.   Hypoglycemia held Lantus today patient has decreased by mouth intake. Continue the sliding scale insulin  Diabetes mellitus insulin-dependent poorly controlled hemoglobin A1c is 15.4. Concern about noncompliance with the diet as well as insulin administration.  Hyponatremia hypovolemic hyponatremia. Continue with IV fluids.  Abnormal appearing colon including cecum on CT scan This will need further evaluation once he is more stable from the above issues. There is no history of any diarrhea. We'll need to discuss with the patient regarding any history of endoscopies when he is not agitated.  Hypothyroidism Continue Synthroid. TSH was elevated at 12, but free T4 is normal.   Coronary artery disease status post CABG Stable. Continue aspirin.   End  stage renal disease 2/2 to DM 1 on dialysis on Tuesday, Thursday, Saturday Nephrology is following and he is being dialyzed as per them. Unclear reason why patient was on Lasix 80 mg twice a day at home given end-stage renal disease. Holding for now.   Accelerated Hypertension BP remains very high likely because his meds are held along with the fact that he is very agitated. Hydralazine PRN. He is on clonidine patch. Start on amlodipine. BB Q6h intravenously.   History of Panic attacks He is on multiple psychotropic agents at home. These are being held due to his alteration in mental status.  Possible shingles involving scalp Patient was started on Valtrex. Discussed with Dr. Arlean Hopping on 3/27. This med can cause confusion. Since we don't have a clear reason for it, will stop for now. Lesions on the scalp not conclusive.  DVT Prophylaxis: Heparin    Code Status: Full code  Family Communication: No family at bedside currently. Discussed with his father today over phone. Disposition Plan: Will remain in stepdown.    LOS: 4 days   [facility]  [facility] Pager [phone] 02/06/2015, 5:08 PM  If 7PM-7AM, please contact night-coverage at [email or web address], password [facility]

## 2015-02-06 NOTE — Progress Notes (Signed)
Patient restless in bed. RN went to patient's room to fix telemetry leads. Upon assessment, pt had tore one of the lead wires. Mittens placed on hands, medication given (see MAR). Notified NP on call, NP states she will pass information to morning MD for  possible discontinuation of telemetry order. Sitter remains at bedside. Will continue to monitor patient.

## 2015-02-06 NOTE — Progress Notes (Signed)
Subjective: Patient currently in HD.  He is slow to respond but reported to be agitated by nurse.  He knows he is at [facility] and that the month is March but believes it is 2008. Follows all commands.   Objective: Current vital signs: BP 172/89 mmHg  Pulse 87  Temp(Src) 97.1 F (36.2 C) (Oral)  Resp 20  Ht 5\' 8"  (1.727 m)  Wt 74.3 kg (163 lb 12.8 oz)  BMI 24.91 kg/m2  SpO2 99% Vital signs in last 24 hours: Temp:  [97.1 F (36.2 C)-98 F (36.7 C)] 97.1 F (36.2 C) (03/30 0650) Pulse Rate:  [84-92] 87 (03/30 1048) Resp:  [19-24] 20 (03/30 1048) BP: (124-179)/(57-92) 172/89 mmHg (03/30 1048) SpO2:  [94 %-100 %] 99 % (03/30 1048) Weight:  [74.3 kg (163 lb 12.8 oz)-76.87 kg (169 lb 7.5 oz)] 74.3 kg (163 lb 12.8 oz) (03/30 1048)  Intake/Output from previous day: 03/29 0701 - 03/30 0700 In: 920 [P.O.:920] Out: 10 [Urine:10] Intake/Output this shift: Total I/O In: -  Out: 1900 [Other:1900] Nutritional status: DIET - DYS 1 Room service appropriate?: Yes; Fluid consistency:: Thin  Neurologic Exam:  Mental Status: Alert, oriented to month and hospital, believes it is 2008.  Slow to respond but answers appropriately. Speech fluent without evidence of aphasia.  Able to follow simple commands without difficulty. Cranial Nerves: II:  Visual fields grossly normal, pupils equal, round, reactive to light and accommodation III,IV, VI: ptosis not present, extra-ocular motions intact bilaterally V,VII: smile symmetric, facial light touch sensation normal bilaterally VIII: hearing normal bilaterally IX,X: uvula rises symmetrically XI: bilateral shoulder shrug XII: midline tongue extension without atrophy or fasciculations  Motor: Right : Upper extremity   5/5    Left:     Upper extremity   5/5  Lower extremity   5/5     Lower extremity   5/5 Tone and bulk:normal tone throughout; no atrophy noted Sensory: Pinprick and light touch intact throughout, bilaterally Deep Tendon Reflexes:   Right: Upper Extremity   Left: Upper extremity   biceps (C-5 to C-6) 2/4   biceps (C-5 to C-6) 2/4 tricep (C7) 2/4    triceps (C7) 2/4 Brachioradialis (C6) 2/4  Brachioradialis (C6) 2/4  Lower Extremity Lower Extremity  quadriceps (L-2 to L-4) 0/4   quadriceps (L-2 to L-4) 0/4 Achilles (S1) 0/4   Achilles (S1) 0/4  Plantars: bilateral toe amputation.  Cerebellar: normal finger-to-nose,      Lab Results: Basic Metabolic Panel:  Recent Labs Lab 02/03/15 2005 02/04/15 0116 02/04/15 0620 02/05/15 0307 02/06/15 0722  NA 133* 133* 133* 137 130*  K 4.5 3.8 3.9 3.8 4.2  CL 96 95* 97 100 91*  CO2 21 23 23 24 22   GLUCOSE 166* 134* 109* 97 70  BUN 23 24* 25* 14 24*  CREATININE 4.40* 4.73* 4.81* 3.58* 4.68*  CALCIUM 8.9 9.2 9.0 9.2 9.5  PHOS  --   --   --  4.0 4.7*    Liver Function Tests:  Recent Labs Lab 02/02/15 0218 02/02/15 0509 02/04/15 1921 02/05/15 0307 02/06/15 0722  AST 17 24 48*  --   --   ALT 10 8 17   --   --   ALKPHOS 282* 244* 241*  --   --   BILITOT 2.3* 3.2* 1.5*  --   --   PROT 7.6 7.0 7.0  --   --   ALBUMIN 3.2* 2.8* 2.8* 2.5* 2.5*   No results for input(s): LIPASE, AMYLASE in the  last 168 hours. No results for input(s): AMMONIA in the last 168 hours.  CBC:  Recent Labs Lab 02/02/15 0218 02/02/15 0509 02/03/15 0323 02/03/15 0751 02/04/15 0620 02/05/15 0307 02/06/15 0721  WBC 14.0* 14.8* 7.8  --  8.7 7.3 11.6*  NEUTROABS 12.8*  --   --   --   --   --   --   HGB 11.2* 10.5* 10.1* 11.9* 11.5* 11.3* 11.0*  HCT 37.1* 34.7* 31.6* 35.0* 36.5* 35.5* 33.8*  MCV 94.4 94.0 89.0  --  90.3 90.8 86.9  PLT 238 210 136*  --  132* 121* 177    Cardiac Enzymes: No results for input(s): CKTOTAL, CKMB, CKMBINDEX, TROPONINI in the last 168 hours.  Lipid Panel: No results for input(s): CHOL, TRIG, HDL, CHOLHDL, VLDL, LDLCALC in the last 168 hours.  CBG:  Recent Labs Lab 02/05/15 0754 02/05/15 1129 02/05/15 1615 02/05/15 2032 02/05/15 2245   GLUCAP 93 183* 247* 221* 190*    Microbiology: Results for orders placed or performed during the hospital encounter of 02/02/15  Urine culture     Status: None   Collection Time: 02/02/15  3:57 AM  Result Value Ref Range Status   Specimen Description URINE, RANDOM  Final   Special Requests NONE  Final   Colony Count   Final    >=100,000 COLONIES/ML Performed at [facility]    Culture   Final    Multiple bacterial morphotypes present, none predominant. Suggest appropriate recollection if clinically indicated. Performed at [facility]    Report Status 02/04/2015 FINAL  Final  Stat Gram stain     Status: None   Collection Time: 02/02/15 10:50 AM  Result Value Ref Range Status   Specimen Description PENILE DRAINAGE  Final   Special Requests Immunocompromised  Final   Gram Stain   Final    ABUNDANT WBC PRESENT,BOTH PMN AND MONONUCLEAR ABUNDANT GRAM NEGATIVE COCCI IN DIPLOS Gram Stain Report Called to,Read Back By and Verified With: A THOMPSON,RN 02/02/15 1207 [facility]    Report Status 02/02/2015 FINAL  Final  Wound culture     Status: None   Collection Time: 02/02/15 10:50 AM  Result Value Ref Range Status   Specimen Description PENILE DRAINAGE  Final   Special Requests Immunocompromised  Final   Gram Stain   Final    ABUNDANT WBC PRESENT,BOTH PMN AND MONONUCLEAR ABUNDANT GRAM NEGATIVE COCCI Gram Stain Report Called to,Read Back By and Verified With: Gram Stain Report Called to,Read Back By and Verified With: A THOMPSON RN 02/02/15 1207 Judie Petit [facility] Performed at [facility]    Culture   Final    MULTIPLE ORGANISMS PRESENT, NONE PREDOMINANT Note: NO STAPHYLOCOCCUS AUREUS ISOLATED NO GROUP A STREP (S.PYOGENES) ISOLATED Performed at [facility]    Report Status 02/05/2015 FINAL  Final  MRSA PCR Screening     Status: None   Collection Time: 02/02/15  5:13 PM  Result Value Ref Range Status   MRSA by PCR NEGATIVE NEGATIVE Final    Comment:         The GeneXpert MRSA Assay (FDA approved for NASAL specimens only), is one component of a comprehensive MRSA colonization surveillance program. It is not intended to diagnose MRSA infection nor to guide or monitor treatment for MRSA infections.   Surgical pcr screen     Status: Abnormal   Collection Time: 02/03/15  6:27 AM  Result Value Ref Range Status   MRSA, PCR NEGATIVE NEGATIVE Final  Staphylococcus aureus POSITIVE (A) NEGATIVE Final    Comment:        The Xpert SA Assay (FDA approved for NASAL specimens in patients over 77 years of age), is one component of a comprehensive surveillance program.  Test performance has been validated by [facility] for patients greater than or equal to 38 year old. It is not intended to diagnose infection nor to guide or monitor treatment.   Anaerobic culture     Status: None (Preliminary result)   Collection Time: 02/03/15  8:29 AM  Result Value Ref Range Status   Specimen Description ABSCESS  Final   Special Requests PENIS  Final   Gram Stain   Final    ABUNDANT WBC PRESENT, PREDOMINANTLY PMN RARE SQUAMOUS EPITHELIAL CELLS PRESENT FEW YEAST ABUNDANT GRAM NEGATIVE RODS Performed at [facility]    Culture   Final    NO ANAEROBES ISOLATED; CULTURE IN PROGRESS FOR 5 DAYS Performed at [facility]    Report Status PENDING  Incomplete  Culture, routine-abscess     Status: None   Collection Time: 02/03/15  8:29 AM  Result Value Ref Range Status   Specimen Description ABSCESS  Final   Special Requests PENIS  Final   Gram Stain   Final    MODERATE WBC PRESENT, PREDOMINANTLY PMN RARE SQUAMOUS EPITHELIAL CELLS PRESENT FEW YEAST ABUNDANT GRAM NEGATIVE RODS Performed at [facility]    Culture   Final    MODERATE ESCHERICHIA COLI Performed at [facility]    Report Status 02/05/2015 FINAL  Final   Organism ID, Bacteria ESCHERICHIA COLI  Final      Susceptibility   Escherichia coli - MIC*     AMPICILLIN >=32 RESISTANT Resistant     AMPICILLIN/SULBACTAM 16 INTERMEDIATE Intermediate     CEFAZOLIN <=4 SENSITIVE Sensitive     CEFEPIME <=1 SENSITIVE Sensitive     CEFTAZIDIME <=1 SENSITIVE Sensitive     CEFTRIAXONE <=1 SENSITIVE Sensitive     CIPROFLOXACIN >=4 RESISTANT Resistant     GENTAMICIN <=1 SENSITIVE Sensitive     IMIPENEM <=0.25 SENSITIVE Sensitive     PIP/TAZO <=4 SENSITIVE Sensitive     TOBRAMYCIN <=1 SENSITIVE Sensitive     TRIMETH/SULFA >=320 RESISTANT Resistant     * MODERATE ESCHERICHIA COLI    Coagulation Studies: No results for input(s): LABPROT, INR in the last 72 hours.  Imaging: No results found.  Medications:  Scheduled: . aspirin  81 mg Oral q morning - 10a  . cefTRIAXone (ROCEPHIN)  IV  1 g Intravenous Q24H  . cloNIDine  0.2 mg Transdermal Weekly  . fluconazole  200 mg Oral QHS  . heparin  5,000 Units Subcutaneous 3 times per day  . insulin aspart  0-15 Units Subcutaneous TID WC  . insulin glargine  10 Units Subcutaneous Daily  . levothyroxine  75 mcg Oral QAC breakfast  . metoprolol  5 mg Intravenous 4 times per day  . sodium chloride  3 mL Intravenous Q12H   EEG: Abnormal EEG due to generalized slowing indicating a mild to moderate cerebral disturbance (encephalopathy). No focal slowing or epileptiform activity noted.  Felicie Morn PA-C [facility] [phone]  02/06/2015, 11:00 AM   Patient seen and examined.  Clinical course and management discussed.  Necessary edits performed.  I agree with the above.  Assessment and plan of care developed and discussed below.    Assessment/Plan: Mental status improved but still not at baseline.  EEG shows slowing  but no epileptiform activity. Cefepime has been changed to Rocephin. White blood cell count is increasing.  Patient is hyponatremic as well.  These issues may slow improvement.  Recommendations: 1.  Agree with addressing metabolic issues. 2.  Will continue to follow with  you   Thana Farr, MD [facility] [phone]  02/06/2015  6:14 PM

## 2015-02-06 NOTE — Progress Notes (Addendum)
[facility] Progress Note   Subjective:   Seen in HD Had packing from OR removed yesterday and replaced with W:D. Purulence was noted from abscess cavity, urethra around foley and at location of prior incision that was opened, drained and packed. Still requiring a safety sitter due to encephalopathy but able to carry on a better conversation today Wants to go home "am I going to dialysis at [facility] tomorrow?"   Filed Vitals:   02/05/15 2247 02/06/15 0459 02/06/15 0650 02/06/15 0700  BP: 157/89 154/83 143/61 148/78  Pulse: 90 92 90   Temp: 98 F (36.7 C) 97.5 F (36.4 C) 97.1 F (36.2 C)   TempSrc: Oral Oral Oral   Resp: 20 20 21    Height: 5\' 8"  (1.727 m)     Weight: 76.87 kg (169 lb 7.5 oz)  76.5 kg (168 lb 10.4 oz)   SpO2: 100% 100% 99%    Physical Exam: BP 148/78 mmHg  Pulse 90  Temp(Src) 97.1 F (36.2 C) (Oral)  Resp 21  Ht 5\' 8"  (1.727 m)  Wt 76.5 kg (168 lb 10.4 oz)  BMI 25.65 kg/m2  SpO2 99%  No JVD Chest clear bilaterally  S1S2 Widely split S2 No S3 no MRG Bruit from L AVF can be hard left upper chest Abd soft, NTND, no mass or ascites GU large dressing over penis/ scrotum, drain and foley in place 1+ LE edema pretib bilat with scabs/excoriation over right pre-tib region. Right TMA, absent L great toe, scabs RLE Neuro is awake, alert, slow to respond but more appropriate Left upper AVF  + bruit and thrill  HD: TTS [facility] 3h  80kg   Heparin none   L arm AVF Will have lower EDW at discharge 02/04/15 Weight 77.9 pre/75.6 post 02/06/15 Weight  76.5 pre/  Assessment/Recommendations  1. Penile/ scrotal abcess, s/p I&D in the OR. Drainage from penis - gm stain read as Gm neg cocci; Gm stain from the OR - Gm neg rods and a few yeast. Culture + EColi. Now on rocephin/fluconazole/dressing changes and had OR packing etc rem'd yesterday 2. ESRD on HD - normal schedule TTS. Last HD 3/28 (Monday) so is off schedule.  Getting dialysis  today off schedule. Will get back onto correct TTS schedule prior to discharge.   He will have a lower EDW at discharge.  Edema is looking better. Now about 4 kg under EDW and still has some edema. Pre-HD labs are still pending. 3. DKA / DM 1 - back on SQ insulin 4. HTN on catapres patch TTS1 and IV metoprolol QID (?) 5. Volume -  +vol excess w LE edema - better as we lower EDW 6. PAD hx toe amps and TMA 7. 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 CAD prior CABG 10. Hypothyroidism on replacement 11. AMS - encephalopathy seems somewhat improved today though still requiring safety sitter.   04-08-1995, MD [facility] [phone] Pager 02/06/2015, 7:15 AM     Recent Labs Lab 02/04/15 0116 02/04/15 0620 02/05/15 0307  NA 133* 133* 137  K 3.8 3.9 3.8  CL 95* 97 100  CO2 23 23 24   GLUCOSE 134* 109* 97  BUN 24* 25* 14  CREATININE 4.73* 4.81* 3.58*  CALCIUM 9.2 9.0 9.2  PHOS  --   --  4.0  Recent Labs Lab 02/02/15 0218 02/02/15 0509 02/04/15 1921 02/05/15 0307  AST 17 24 48*  --   ALT 10 8 17   --   ALKPHOS 282* 244* 241*  --   BILITOT 2.3* 3.2* 1.5*  --   PROT 7.6 7.0 7.0  --   ALBUMIN 3.2* 2.8* 2.8* 2.5*    Recent Labs Lab 02/02/15 0218  02/03/15 0323 02/03/15 0751 02/04/15 0620 02/05/15 0307  WBC 14.0*  < > 7.8  --  8.7 7.3  NEUTROABS 12.8*  --   --   --   --   --   HGB 11.2*  < > 10.1* 11.9* 11.5* 11.3*  HCT 37.1*  < > 31.6* 35.0* 36.5* 35.5*  MCV 94.4  < > 89.0  --  90.3 90.8  PLT 238  < > 136*  --  132* 121*  < > = values in this interval not displayed.   Medications . aspirin  81 mg Oral q morning - 10a  . cefTRIAXone (ROCEPHIN)  IV  1 g Intravenous Q24H  . cloNIDine  0.2 mg Transdermal Weekly  . fluconazole  200 mg Oral QHS  . heparin  5,000 Units Subcutaneous 3 times per day  .  insulin aspart  0-15 Units Subcutaneous TID WC  . insulin glargine  10 Units Subcutaneous Daily  . levothyroxine  75 mcg Oral QAC breakfast  . metoprolol  5 mg Intravenous 4 times per day  . sodium chloride  3 mL Intravenous Q12H     haloperidol lactate, hydrALAZINE, LORazepam, morphine injection  Addendum: Post dialysis weight 74.3 kg today Previous EDW was 80 kg Still has a little bit of edema.  02/07/15, MD [facility] [phone] Pager 02/06/2015, 3:52 PM

## 2015-02-06 NOTE — Progress Notes (Signed)
ANTIBIOTIC CONSULT NOTE - FOLLOW UP  Pharmacy Consult for renal dose adjustment of ceftriaxone and diflucan Indication: PenoScrotal Abscess & Hx of fungal pyocystis  Allergies  Allergen Reactions  . Amoxicillin-Pot Clavulanate Nausea And Vomiting  . Hydromorphone Hcl     "body started down"  08/22/14 - pt states no longer has problems with this  . Rifampin Nausea And Vomiting    Patient Measurements: Height: 5\' 8"  (172.7 cm) Weight: 163 lb 12.8 oz (74.3 kg) IBW/kg (Calculated) : 68.4  Vital Signs: Temp: 97.5 F (36.4 C) (03/30 1141) Temp Source: Oral (03/30 1141) BP: 154/67 mmHg (03/30 1141) Pulse Rate: 91 (03/30 1141) Intake/Output from previous day: 03/29 0701 - 03/30 0700 In: 920 [P.O.:920] Out: 10 [Urine:10] Intake/Output from this shift: Total I/O In: -  Out: 1900 [Other:1900]  Labs:  Recent Labs  02/04/15 0620 02/05/15 0307 02/06/15 0721 02/06/15 0722  WBC 8.7 7.3 11.6*  --   HGB 11.5* 11.3* 11.0*  --   PLT 132* 121* 177  --   CREATININE 4.81* 3.58*  --  4.68*   Estimated Creatinine Clearance: 19.1 mL/min (by C-G formula based on Cr of 4.68). No results for input(s): VANCOTROUGH, VANCOPEAK, VANCORANDOM, GENTTROUGH, GENTPEAK, GENTRANDOM, TOBRATROUGH, TOBRAPEAK, TOBRARND, AMIKACINPEAK, AMIKACINTROU, AMIKACIN in the last 72 hours.   Microbiology: Recent Results (from the past 720 hour(s))  Urine culture     Status: None   Collection Time: 02/02/15  3:57 AM  Result Value Ref Range Status   Specimen Description URINE, RANDOM  Final   Special Requests NONE  Final   Colony Count   Final    >=100,000 COLONIES/ML Performed at [facility]    Culture   Final    Multiple bacterial morphotypes present, none predominant. Suggest appropriate recollection if clinically indicated. Performed at [facility]    Report Status 02/04/2015 FINAL  Final  Stat Gram stain     Status: None   Collection Time: 02/02/15 10:50 AM  Result Value Ref Range  Status   Specimen Description PENILE DRAINAGE  Final   Special Requests Immunocompromised  Final   Gram Stain   Final    ABUNDANT WBC PRESENT,BOTH PMN AND MONONUCLEAR ABUNDANT GRAM NEGATIVE COCCI IN DIPLOS Gram Stain Report Called to,Read Back By and Verified With: A THOMPSON,RN 02/02/15 1207 [address] [facility]    Report Status 02/02/2015 FINAL  Final  Wound culture     Status: None   Collection Time: 02/02/15 10:50 AM  Result Value Ref Range Status   Specimen Description PENILE DRAINAGE  Final   Special Requests Immunocompromised  Final   Gram Stain   Final    ABUNDANT WBC PRESENT,BOTH PMN AND MONONUCLEAR ABUNDANT GRAM NEGATIVE COCCI Gram Stain Report Called to,Read Back By and Verified With: Gram Stain Report Called to,Read Back By and Verified With: A THOMPSON RN 02/02/15 1207 1208 [facility] Performed at [facility]    Culture   Final    MULTIPLE ORGANISMS PRESENT, NONE PREDOMINANT Note: NO STAPHYLOCOCCUS AUREUS ISOLATED NO GROUP A STREP (S.PYOGENES) ISOLATED Performed at [facility]    Report Status 02/05/2015 FINAL  Final  MRSA PCR Screening     Status: None   Collection Time: 02/02/15  5:13 PM  Result Value Ref Range Status   MRSA by PCR NEGATIVE NEGATIVE Final    Comment:        The GeneXpert MRSA Assay (FDA approved for NASAL specimens only), is one component of a comprehensive MRSA colonization surveillance program. It  is not intended to diagnose MRSA infection nor to guide or monitor treatment for MRSA infections.   Surgical pcr screen     Status: Abnormal   Collection Time: 02/03/15  6:27 AM  Result Value Ref Range Status   MRSA, PCR NEGATIVE NEGATIVE Final   Staphylococcus aureus POSITIVE (A) NEGATIVE Final    Comment:        The Xpert SA Assay (FDA approved for NASAL specimens in patients over 63 years of age), is one component of a comprehensive surveillance program.  Test performance has been validated by [facility] for patients greater than  or equal to 30 year old. It is not intended to diagnose infection nor to guide or monitor treatment.   Anaerobic culture     Status: None (Preliminary result)   Collection Time: 02/03/15  8:29 AM  Result Value Ref Range Status   Specimen Description ABSCESS  Final   Special Requests PENIS  Final   Gram Stain   Final    ABUNDANT WBC PRESENT, PREDOMINANTLY PMN RARE SQUAMOUS EPITHELIAL CELLS PRESENT FEW YEAST ABUNDANT GRAM NEGATIVE RODS Performed at [facility]    Culture   Final    NO ANAEROBES ISOLATED; CULTURE IN PROGRESS FOR 5 DAYS Performed at [facility]    Report Status PENDING  Incomplete  Culture, routine-abscess     Status: None   Collection Time: 02/03/15  8:29 AM  Result Value Ref Range Status   Specimen Description ABSCESS  Final   Special Requests PENIS  Final   Gram Stain   Final    MODERATE WBC PRESENT, PREDOMINANTLY PMN RARE SQUAMOUS EPITHELIAL CELLS PRESENT FEW YEAST ABUNDANT GRAM NEGATIVE RODS Performed at [facility]    Culture   Final    MODERATE ESCHERICHIA COLI Performed at [facility]    Report Status 02/05/2015 FINAL  Final   Organism ID, Bacteria ESCHERICHIA COLI  Final      Susceptibility   Escherichia coli - MIC*    AMPICILLIN >=32 RESISTANT Resistant     AMPICILLIN/SULBACTAM 16 INTERMEDIATE Intermediate     CEFAZOLIN <=4 SENSITIVE Sensitive     CEFEPIME <=1 SENSITIVE Sensitive     CEFTAZIDIME <=1 SENSITIVE Sensitive     CEFTRIAXONE <=1 SENSITIVE Sensitive     CIPROFLOXACIN >=4 RESISTANT Resistant     GENTAMICIN <=1 SENSITIVE Sensitive     IMIPENEM <=0.25 SENSITIVE Sensitive     PIP/TAZO <=4 SENSITIVE Sensitive     TOBRAMYCIN <=1 SENSITIVE Sensitive     TRIMETH/SULFA >=320 RESISTANT Resistant     * MODERATE ESCHERICHIA COLI    Anti-infectives    Start     Dose/Rate Route Frequency Ordered Stop   02/05/15 1200  ceFEPIme (MAXIPIME) 2 g in dextrose 5 % 50 mL IVPB  Status:  Discontinued     2 g 100  mL/hr over 30 Minutes Intravenous Every T-Th-Sa (Hemodialysis) 02/02/15 0858 02/02/15 1432   02/05/15 1200  cefTRIAXone (ROCEPHIN) 1 g in dextrose 5 % 50 mL IVPB - Premix     1 g 100 mL/hr over 30 Minutes Intravenous Every 24 hours 02/05/15 1132     02/04/15 1800  ceFEPIme (MAXIPIME) 2 g in dextrose 5 % 50 mL IVPB     2 g 100 mL/hr over 30 Minutes Intravenous  Once 02/04/15 1301 02/04/15 1659   02/02/15 2200  valACYclovir (VALTREX) tablet 1,000 mg  Status:  Discontinued     1,000 mg Oral Daily at bedtime 02/02/15 0858  02/03/15 1051   02/02/15 2200  fluconazole (DIFLUCAN) tablet 200 mg     200 mg Oral Daily at bedtime 02/02/15 0858     02/02/15 1600  ceFEPIme (MAXIPIME) 2 g in dextrose 5 % 50 mL IVPB  Status:  Discontinued     2 g 100 mL/hr over 30 Minutes Intravenous Every T-Th-Sa (Hemodialysis) 02/02/15 1432 02/05/15 1131   02/02/15 1000  fluconazole (DIFLUCAN) tablet 100 mg  Status:  Discontinued     100 mg Oral Daily 02/02/15 0802 02/02/15 0858   02/02/15 1000  valACYclovir (VALTREX) tablet 1,000 mg  Status:  Discontinued     1,000 mg Oral 2 times daily 02/02/15 0802 02/02/15 0858   02/02/15 0900  ceFEPIme (MAXIPIME) 2 g in dextrose 5 % 50 mL IVPB     2 g 100 mL/hr over 30 Minutes Intravenous  Once 02/02/15 0858 02/02/15 1021   02/02/15 0430  acyclovir (ZOVIRAX) 800 mg in dextrose 5 % 150 mL IVPB     800 mg 166 mL/hr over 60 Minutes Intravenous  Once 02/02/15 0422 02/02/15 0655      Assessment: 44yoM admitted 3/26 in DKA which has now resolved, also with penoscrotal abscess growing Ecoli sensitive to ceftriaxone. Pt also on diflucan for hx of fungal pyocystis. Pharmacy consulted to adjust antimicrobial doses as indicated by renal function. Pt has ESRD on HD TThS as an outpatient, off schedule since admission. HD sessions since admission include 3/26, 3/28, 3/30. Tolerating well, BFR 400 x 3-4hrs. WBC 11.6, afebrile, minimal UOP.  Cefepime 3/26>>3/28 Diflucan 3/26>>  Valtrex  3/26>>3/27 (dc'd for AMS and ?indication) CTX 3/29>>  Surgical PCR MRSA + 3/26 UCx>> no predominant 3/27 Abscess anaerobic>> no anaerobes, +GNR 3/27 Abscess Cx>> Mod Ecoli (R Amp, Cipro, Bactrim, I Unasyn) 3/26 Penile Drainage cx>> GNC>>none predominant, final  Goal of Therapy:  Eradication of infection  Plan:  Increase ceftriaxone to 2g IV q24h Continue fluconazole 200mg  PO q24h Monitor cultures, clinical progress, and HD schedule Pharmacy will sign off as no further dose adjustment is anticipated, please reconsult when necessary.  Thank you for allowing pharmacy to be part of this patient's care team  Brentyn Seehafer M. Charnele Semple, Pharm.D Clinical Pharmacy Resident Pager: [phone] 02/06/2015 .1:41 PM

## 2015-02-06 NOTE — Progress Notes (Signed)
Hypoglycemic Event  CBG: 56  Treatment: carb snack  Symptoms: none  Follow-up CBG: Time: 12:32 CBG Result: 94  Possible Reasons for Event: unknown  Comments/MD notified: yes    [name], Taylor Nielsen  Remember to initiate Hypoglycemia Order Set & complete

## 2015-02-07 DIAGNOSIS — E1010 Type 1 diabetes mellitus with ketoacidosis without coma: Secondary | ICD-10-CM

## 2015-02-07 LAB — RENAL FUNCTION PANEL
Albumin: 2.2 g/dL — ABNORMAL LOW (ref 3.5–5.2)
Anion gap: 12 (ref 5–15)
BUN: 20 mg/dL (ref 6–23)
CALCIUM: 8.8 mg/dL (ref 8.4–10.5)
CO2: 23 mmol/L (ref 19–32)
Chloride: 94 mmol/L — ABNORMAL LOW (ref 96–112)
Creatinine, Ser: 4 mg/dL — ABNORMAL HIGH (ref 0.50–1.35)
GFR calc Af Amer: 19 mL/min — ABNORMAL LOW (ref >90)
GFR, EST NON AFRICAN AMERICAN: 17 mL/min — AB (ref >90)
GLUCOSE: 342 mg/dL — AB (ref 70–99)
Phosphorus: 3.3 mg/dL (ref 2.3–4.6)
Potassium: 4.1 mmol/L (ref 3.5–5.1)
Sodium: 129 mmol/L — ABNORMAL LOW (ref 135–145)

## 2015-02-07 LAB — GLUCOSE, CAPILLARY
GLUCOSE-CAPILLARY: 351 mg/dL — AB (ref 70–99)
GLUCOSE-CAPILLARY: 282 mg/dL — AB (ref 70–99)
GLUCOSE-CAPILLARY: 198 mg/dL — AB (ref 70–99)
Glucose-Capillary: 277 mg/dL — ABNORMAL HIGH (ref 70–99)

## 2015-02-07 LAB — CBC
HCT: 32.6 % — ABNORMAL LOW (ref 39.0–52.0)
HEMOGLOBIN: 10.7 g/dL — AB (ref 13.0–17.0)
MCH: 29.1 pg (ref 26.0–34.0)
MCHC: 32.8 g/dL (ref 30.0–36.0)
MCV: 88.6 fL (ref 78.0–100.0)
PLATELETS: 117 10*3/uL — AB (ref 150–400)
RBC: 3.68 MIL/uL — AB (ref 4.22–5.81)
RDW: 16 % — ABNORMAL HIGH (ref 11.5–15.5)
WBC: 6.8 10*3/uL (ref 4.0–10.5)

## 2015-02-07 NOTE — Evaluation (Signed)
Occupational Therapy Evaluation Patient Details Name: Taylor [name] MRN: [id number] DOB: Jul 31, 1971 Today's Date: 02/07/2015    History of Present Illness 44 y/o admitted with DKA and possible shingles on his head. 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 02/03/2015  I&D of penialscrotal abscess    Clinical Impression   This 44 yo male admitted with above presents to acute OT with decreased balance, slow processing, decreased mobility, and decreased strength all affecting his ability to care for himself as he was pta. He will benefit from acute OT with follow up on CIR to get back to his PLOF which pt reports as Independent to Mod I at times with a SPC.    Follow Up Recommendations  CIR    Equipment Recommendations   (TBD at next venue)       Precautions / Restrictions Precautions Precautions: Fall Precaution Comments: R forefoot amputation Restrictions Weight Bearing Restrictions: No      Mobility Bed Mobility Overal bed mobility: Needs Assistance Bed Mobility: Supine to Sit     Supine to sit: Mod assist;HOB elevated (use of rail and VCs for sequencing)        Transfers Overall transfer level: Needs assistance Equipment used: 2 person hand held assist Transfers: Sit to/from [employer] Sit to Stand:  (mod A +1 for initial sit>stand, but then +2 for full upright standing with pt bracing his calves against bed)              Balance Overall balance assessment: Needs assistance Sitting-balance support: No upper extremity supported;Feet supported Sitting balance-Leahy Scale: Fair     Standing balance support: Bilateral upper extremity supported Standing balance-Leahy Scale: Poor                              ADL Overall ADL's : Needs  assistance/impaired Eating/Feeding: Set up;Supervision/ safety;Bed level (*)   Grooming: Supervision/safety;Set up;Sitting (*)   Upper Body Bathing: Set up;Supervision/ safety (*)   Lower Body Bathing: Moderate assistance (*; with Mod A+2 for standing up tall)   Upper Body Dressing : Minimal assistance;Sitting (*)   Lower Body Dressing: Maximal assistance (*; with Mod A+2 for standing up tall)   Toilet Transfer: Moderate assistance;Stand-pivot (bed>recliner)   Toileting- Clothing Manipulation and Hygiene: Total assistance (with mod A +2 for standing up tall)                         Pertinent Vitals/Pain Pain Assessment: No/denies pain     Hand Dominance Right   Extremity/Trunk Assessment Upper Extremity Assessment Upper Extremity Assessment: Generalized weakness (wrist to shoulders genrally 3/5; hands 2/5 with decreased opposition for 5th digit; flattening of palms which pt reports has been that way for a long time)   Lower Extremity Assessment Lower Extremity Assessment: Defer to PT evaluation       Communication Communication Communication: No difficulties   Cognition Arousal/Alertness: Awake/alert Behavior During Therapy: WFL for tasks assessed/performed Overall Cognitive Status: No family/caregiver present to determine baseline cognitive functioning (slow to respond at times, said it was 2018 (otherwise other orientation questions he got correct); had a hard time following my line of questioning about how he wanted his coffee when I asked regular or decaf.)  Home Living Family/patient expects to be discharged to:: Inpatient rehab Living Arrangements: Parent Available Help at Discharge: Available 24 hours/day Type of Home: House Home Access: Stairs to enter [employer] of Steps: 1 Entrance Stairs-Rails: None Home Layout: One level     Bathroom Shower/Tub: Tub/shower unit;Curtain (says he usually takes  tub baths) Shower/tub characteristics: Engineer, building services: Standard                     OT Diagnosis: Generalized weakness (? cogntive deficits (baseline or worse))   OT Problem List: Decreased strength;Decreased range of motion;Impaired balance (sitting and/or standing);Impaired UE functional use;Decreased knowledge of precautions;Decreased knowledge of use of DME or AE;Decreased coordination   OT Treatment/Interventions: Self-care/ADL training;Patient/family education;Balance training;Therapeutic exercise;Therapeutic activities;DME and/or AE instruction    OT Goals(Current goals can be found in the care plan section) Acute Rehab OT Goals Patient Stated Goal: to go to rehab OT Goal Formulation: With patient Time For Goal Achievement: 02/21/15 Potential to Achieve Goals: Good  OT Frequency: Min 2X/week           Co-evaluation PT/OT/SLP Co-Evaluation/Treatment: Yes Reason for Co-Treatment: For patient/therapist safety   OT goals addressed during session: ADL's and self-care;Strengthening/ROM      End of Session Equipment Utilized During Treatment: Gait belt Nurse Communication:  (+1 mod for stand pivot from recliner to bed with NT in front of him)  Activity Tolerance: Patient tolerated treatment well Patient left: in chair;with call bell/phone within reach;with nursing/sitter in room   Time: 1601-0932 OT Time Calculation (min): 26 min Charges:  OT General Charges $OT Visit: 1 Procedure OT Evaluation $Initial OT Evaluation Tier I: 1 Procedure  Taylor [name] [phone] 02/07/2015, 10:22 AM

## 2015-02-07 NOTE — Progress Notes (Signed)
[facility] Progress Note   Subjective:  Had HD yesterday (off schedule) Post dialysis weight was 74.3 kg Previous EDW was 80 kg Still has a little bit of edema.  Had packing from OR removed  and replaced with W:D 3/29. Purulence was noted from abscess cavity, urethra around foley and at location of prior incision that was opened, drained and packed. Nurses say there is still a lot of drainage from around penrose drain He does not complain of any perineal pain, curiously.  Still requiring a safety sitter Falls asleep during conversation Started on seroquel   Filed Vitals:   02/07/15 0325 02/07/15 0524 02/07/15 0730 02/07/15 0956  BP: 126/72 135/79 108/57 128/67  Pulse: 82 88 85 77  Temp: 98.5 F (36.9 C) 97.9 F (36.6 C) 97.8 F (36.6 C) 97.8 F (36.6 C)  TempSrc: Axillary Axillary Oral Oral  Resp: 16 16 18 18   Height:      Weight:      SpO2: 95% 99% 99% 100%   Physical Exam: BP 128/67 mmHg  Pulse 77  Temp(Src) 97.8 F (36.6 C) (Oral)  Resp 18  Ht 5\' 8"  (1.727 m)  Wt 77.293 kg (170 lb 6.4 oz)  BMI 25.92 kg/m2  SpO2 100% Falls asleep whil No JVD Chest clear bilaterally  S1S2 Widely split S2 No S3 no MRG Bruit from L AVF can be hard left upper chest Abd soft, NTND, no mass or ascites GU large dressing over penis/ scrotum, drain and foley in place 1+ LE edema pretib bilat with scabs/excoriation over right pre-tib region. Right TMA, absent L great toe, scabs RLE Neuro is awake, alert, slow to respond but more appropriate Left upper AVF  + bruit and thrill  HD: TTS [facility] 3h  80kg   Heparin none   L arm AVF Will have lower EDW at discharge 02/04/15 Weight 77.9 pre/75.6 post 02/06/15 Weight  76.5 pre/  Assessment/Recommendations  1. Penile/ scrotal abcess, s/p I&D in the OR.  Culture + EColi. Now on rocephin/fluconazole/dressing changes and had OR packing etc rem'd yesterday. 2. ESRD on HD - normal schedule TTS.  Off schedule.  Will get back onto correct TTS schedule with HD tomorrow.   He will have a lower EDW at discharge.  Edema is looking better. (Now about 4 kg under EDW and still has some edema).  3. DKA / DM 1 - back on SQ insulin 4. HTN on catapres patch TTS1 and IV metoprolol QID (?) 5. Volume -  +vol excess w LE edema - better as we lower EDW 6. PAD hx toe amps and TMA 7. 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 CAD prior CABG 10. Hypothyroidism on replacement 11. AMS - encephalopathy seems somewhat improved today though still requiring safety sitter.   08/2014, MD [facility] [phone] Pager 02/07/2015, 11:40 AM     Recent Labs Lab 02/05/15 0307 02/06/15 0722 02/07/15 0825  NA 137 130* 129*  K 3.8 4.2 4.1  CL 100 91* 94*  CO2 24 22 23   GLUCOSE 97 70 342*  BUN 14 24* 20  CREATININE 3.58* 4.68* 4.00*  CALCIUM 9.2 9.5 8.8  PHOS 4.0 4.7* 3.3    Recent Labs Lab 02/02/15 0218 02/02/15 0509 02/04/15 1921 02/05/15 0307 02/06/15 0722 02/07/15 0825  AST 17 24 48*  --   --   --  ALT 10 8 17   --   --   --   ALKPHOS 282* 244* 241*  --   --   --   BILITOT 2.3* 3.2* 1.5*  --   --   --   PROT 7.6 7.0 7.0  --   --   --   ALBUMIN 3.2* 2.8* 2.8* 2.5* 2.5* 2.2*    Recent Labs Lab 02/02/15 0218  02/05/15 0307 02/06/15 0721 02/07/15 0825  WBC 14.0*  < > 7.3 11.6* 6.8  NEUTROABS 12.8*  --   --   --   --   HGB 11.2*  < > 11.3* 11.0* 10.7*  HCT 37.1*  < > 35.5* 33.8* 32.6*  MCV 94.4  < > 90.8 86.9 88.6  PLT 238  < > 121* 177 117*  < > = values in this interval not displayed.   Medications . aspirin  81 mg Oral q morning - 10a  . cefTRIAXone (ROCEPHIN)  IV  2 g Intravenous Q24H  . cloNIDine  0.2 mg Transdermal Weekly  . fluconazole  200 mg Oral QHS  . heparin  5,000 Units Subcutaneous 3 times per day  . insulin  aspart  0-15 Units Subcutaneous TID WC  . insulin glargine  10 Units Subcutaneous Daily  . levothyroxine  75 mcg Oral QAC breakfast  . metoprolol  5 mg Intravenous 4 times per day  . QUEtiapine  25 mg Oral BID  . sodium chloride  3 mL Intravenous Q12H     hydrALAZINE    02/09/15, MD [facility] [phone] Pager 02/07/2015, 11:40 AM

## 2015-02-07 NOTE — Progress Notes (Signed)
Speech Language Pathology Treatment: Dysphagia  Patient Details Name: Taylor [name] MRN: [id number] DOB: 1971-09-10 Today's Date: 02/07/2015 Time: 6803-2122 SLP Time Calculation (min) (ACUTE ONLY): 20 min  Assessment / Plan / Recommendation Clinical Impression  Skilled observation of consumption of regular consistency and thin liquids via cup/straw with decreased mastication noted and multiple swallows with solids only, but pt exhibited no overt s/s of aspiration and compensatory strategies followed with minimal cueing (per sitter) during meals and SLP during treatment session.  Due to patient's intermittent decreased cognition and need for sitter, Dysphagia 3/thin continued recommended with swallow precautions of slow rate/upright 90 degrees and monitoring volume of solids during meals should continue, but medication can be given whole with liquids as tolerated.   HPI HPI: Pt is a 44 y/o male with PMH of ESRD (since 2010), DM and DM neuropathy, penile prosthesis, s/p infection w/removal of hardware with primary complaint of nausea and vomiting (3 day history). Pt reports he has been throwing up 3-4 per day, unable to consume food for past five days ( from 3/28). MD suspected infection of penis. Pt subsequently underwent irrigation and debridement scrotal/penile abscess cystoscopy. Pt has no prior hx of dysphagia.   Pertinent Vitals Pain Assessment: No/denies pain  SLP Plan  All goals met    Recommendations Diet recommendations: Dysphagia 3 (mechanical soft);Thin liquid Liquids provided via: Cup;Straw Medication Administration: Whole meds with liquid Supervision: Patient able to self feed;Intermittent supervision to cue for compensatory strategies Compensations: Slow rate;Small sips/bites Postural Changes and/or Swallow Maneuvers: Seated upright 90 degrees              Oral Care Recommendations: Oral care BID Follow up Recommendations: None Plan: All goals met          Taylor [name],PAT, M.S., CCC-SLP 02/07/2015, 3:18 PM

## 2015-02-07 NOTE — Progress Notes (Signed)
Subjective: Patient less agitated.  Cognition improved.     Objective: Current vital signs: BP 108/57 mmHg  Pulse 85  Temp(Src) 97.8 F (36.6 C) (Oral)  Resp 18  Ht 5\' 8"  (1.727 m)  Wt 77.293 kg (170 lb 6.4 oz)  BMI 25.92 kg/m2  SpO2 99% Vital signs in last 24 hours: Temp:  [97.5 F (36.4 C)-98.6 F (37 C)] 97.8 F (36.6 C) (03/31 0730) Pulse Rate:  [78-93] 85 (03/31 0730) Resp:  [16-20] 18 (03/31 0730) BP: (108-172)/(57-96) 108/57 mmHg (03/31 0730) SpO2:  [92 %-100 %] 99 % (03/31 0730) Weight:  [74.3 kg (163 lb 12.8 oz)-77.293 kg (170 lb 6.4 oz)] 77.293 kg (170 lb 6.4 oz) (03/30 2027)  Intake/Output from previous day: 03/30 0701 - 03/31 0700 In: 1600 [P.O.:1600] Out: 2076 [Urine:175; Stool:1] Intake/Output this shift:   Nutritional status: DIET DYS 3 Room service appropriate?: Yes; Fluid consistency:: Thin  Neurologic Exam: Mental Status: Alert, oriented to month, year is 2016. Speech fluent without evidence of aphasia. Able to take right thumb and cross over to touch his left ear although asked to do the opposite.  Cranial Nerves: II: Visual fields grossly normal, pupils equal, round, reactive to light and accommodation III,IV, VI: ptosis not present, extra-ocular motions intact bilaterally V,VII: smile symmetric, facial light touch sensation normal bilaterally VIII: hearing normal bilaterally IX,X: uvula rises symmetrically XI: bilateral shoulder shrug XII: midline tongue extension without atrophy or fasciculations  Motor: Right :Upper extremity 5/5Left: Upper extremity 5/5 Lower extremity 5/5Lower extremity 5/5 Tone and bulk:normal tone throughout; no atrophy noted Sensory: Pinprick and light touch intact throughout, bilaterally Deep Tendon Reflexes:  Right: Upper Extremity Left: Upper extremity   biceps (C-5 to C-6) 2/4  biceps (C-5 to C-6) 2/4 tricep (C7) 2/4triceps (C7) 2/4 Brachioradialis (C6) 2/4Brachioradialis (C6) 2/4  Lower Extremity Lower Extremity  quadriceps (L-2 to L-4) 0/4 quadriceps (L-2 to L-4) 0/4 Achilles (S1) 0/4Achilles (S1) 0/4  Plantars: bilateral toe amputation.  Cerebellar: normal finger-to-nose,   Lab Results: Basic Metabolic Panel:  Recent Labs Lab 02/03/15 2005 02/04/15 0116 02/04/15 0620 02/05/15 0307 02/06/15 0722  NA 133* 133* 133* 137 130*  K 4.5 3.8 3.9 3.8 4.2  CL 96 95* 97 100 91*  CO2 21 23 23 24 22   GLUCOSE 166* 134* 109* 97 70  BUN 23 24* 25* 14 24*  CREATININE 4.40* 4.73* 4.81* 3.58* 4.68*  CALCIUM 8.9 9.2 9.0 9.2 9.5  PHOS  --   --   --  4.0 4.7*    Liver Function Tests:  Recent Labs Lab 02/02/15 0218 02/02/15 0509 02/04/15 1921 02/05/15 0307 02/06/15 0722  AST 17 24 48*  --   --   ALT 10 8 17   --   --   ALKPHOS 282* 244* 241*  --   --   BILITOT 2.3* 3.2* 1.5*  --   --   PROT 7.6 7.0 7.0  --   --   ALBUMIN 3.2* 2.8* 2.8* 2.5* 2.5*   No results for input(s): LIPASE, AMYLASE in the last 168 hours. No results for input(s): AMMONIA in the last 168 hours.  CBC:  Recent Labs Lab 02/02/15 0218  02/03/15 0323 02/03/15 0751 02/04/15 0620 02/05/15 0307 02/06/15 0721 02/07/15 0825  WBC 14.0*  < > 7.8  --  8.7 7.3 11.6* 6.8  NEUTROABS 12.8*  --   --   --   --   --   --   --   HGB 11.2*  < >  10.1* 11.9* 11.5* 11.3* 11.0* 10.7*  HCT 37.1*  < > 31.6* 35.0* 36.5* 35.5* 33.8* 32.6*  MCV 94.4  < > 89.0  --  90.3 90.8 86.9 88.6  PLT 238  < > 136*  --  132* 121* 177 PENDING  < > = values in this interval not displayed.  Cardiac Enzymes: No results for input(s): CKTOTAL, CKMB, CKMBINDEX, TROPONINI in the last 168 hours.  Lipid Panel: No results for input(s): CHOL, TRIG, HDL, CHOLHDL, VLDL, LDLCALC in the last  622 hours.  CBG:  Recent Labs Lab 02/06/15 1150 02/06/15 1232 02/06/15 1728 02/06/15 2204 02/07/15 0725  GLUCAP 56* 94 269* 293* 351*    Microbiology: Results for orders placed or performed during the hospital encounter of 02/02/15  Urine culture     Status: None   Collection Time: 02/02/15  3:57 AM  Result Value Ref Range Status   Specimen Description URINE, RANDOM  Final   Special Requests NONE  Final   Colony Count   Final    >=100,000 COLONIES/ML Performed at [facility]    Culture   Final    Multiple bacterial morphotypes present, none predominant. Suggest appropriate recollection if clinically indicated. Performed at [facility]    Report Status 02/04/2015 FINAL  Final  Stat Gram stain     Status: None   Collection Time: 02/02/15 10:50 AM  Result Value Ref Range Status   Specimen Description PENILE DRAINAGE  Final   Special Requests Immunocompromised  Final   Gram Stain   Final    ABUNDANT WBC PRESENT,BOTH PMN AND MONONUCLEAR ABUNDANT GRAM NEGATIVE COCCI IN DIPLOS Gram Stain Report Called to,Read Back By and Verified With: A THOMPSON,RN 02/02/15 1207 [facility]    Report Status 02/02/2015 FINAL  Final  Wound culture     Status: None   Collection Time: 02/02/15 10:50 AM  Result Value Ref Range Status   Specimen Description PENILE DRAINAGE  Final   Special Requests Immunocompromised  Final   Gram Stain   Final    ABUNDANT WBC PRESENT,BOTH PMN AND MONONUCLEAR ABUNDANT GRAM NEGATIVE COCCI Gram Stain Report Called to,Read Back By and Verified With: Gram Stain Report Called to,Read Back By and Verified With: A THOMPSON RN 02/02/15 1207 Judie Petit [facility] Performed at [facility]    Culture   Final    MULTIPLE ORGANISMS PRESENT, NONE PREDOMINANT Note: NO STAPHYLOCOCCUS AUREUS ISOLATED NO GROUP A STREP (S.PYOGENES) ISOLATED Performed at [facility]    Report Status 02/05/2015 FINAL  Final  MRSA PCR Screening     Status: None    Collection Time: 02/02/15  5:13 PM  Result Value Ref Range Status   MRSA by PCR NEGATIVE NEGATIVE Final    Comment:        The GeneXpert MRSA Assay (FDA approved for NASAL specimens only), is one component of a comprehensive MRSA colonization surveillance program. It is not intended to diagnose MRSA infection nor to guide or monitor treatment for MRSA infections.   Surgical pcr screen     Status: Abnormal   Collection Time: 02/03/15  6:27 AM  Result Value Ref Range Status   MRSA, PCR NEGATIVE NEGATIVE Final   Staphylococcus aureus POSITIVE (A) NEGATIVE Final    Comment:        The Xpert SA Assay (FDA approved for NASAL specimens in patients over 43 years of age), is one component of a comprehensive surveillance program.  Test performance has been validated by [facility]  for patients greater than or equal to 76 year old. It is not intended to diagnose infection nor to guide or monitor treatment.   Anaerobic culture     Status: None (Preliminary result)   Collection Time: 02/03/15  8:29 AM  Result Value Ref Range Status   Specimen Description ABSCESS  Final   Special Requests PENIS  Final   Gram Stain   Final    ABUNDANT WBC PRESENT, PREDOMINANTLY PMN RARE SQUAMOUS EPITHELIAL CELLS PRESENT FEW YEAST ABUNDANT GRAM NEGATIVE RODS Performed at [facility]    Culture   Final    NO ANAEROBES ISOLATED; CULTURE IN PROGRESS FOR 5 DAYS Performed at [facility]    Report Status PENDING  Incomplete  Culture, routine-abscess     Status: None   Collection Time: 02/03/15  8:29 AM  Result Value Ref Range Status   Specimen Description ABSCESS  Final   Special Requests PENIS  Final   Gram Stain   Final    MODERATE WBC PRESENT, PREDOMINANTLY PMN RARE SQUAMOUS EPITHELIAL CELLS PRESENT FEW YEAST ABUNDANT GRAM NEGATIVE RODS Performed at [facility]    Culture   Final    MODERATE ESCHERICHIA COLI Performed at [facility]    Report Status  02/05/2015 FINAL  Final   Organism ID, Bacteria ESCHERICHIA COLI  Final      Susceptibility   Escherichia coli - MIC*    AMPICILLIN >=32 RESISTANT Resistant     AMPICILLIN/SULBACTAM 16 INTERMEDIATE Intermediate     CEFAZOLIN <=4 SENSITIVE Sensitive     CEFEPIME <=1 SENSITIVE Sensitive     CEFTAZIDIME <=1 SENSITIVE Sensitive     CEFTRIAXONE <=1 SENSITIVE Sensitive     CIPROFLOXACIN >=4 RESISTANT Resistant     GENTAMICIN <=1 SENSITIVE Sensitive     IMIPENEM <=0.25 SENSITIVE Sensitive     PIP/TAZO <=4 SENSITIVE Sensitive     TOBRAMYCIN <=1 SENSITIVE Sensitive     TRIMETH/SULFA >=320 RESISTANT Resistant     * MODERATE ESCHERICHIA COLI    Coagulation Studies: No results for input(s): LABPROT, INR in the last 72 hours.  Imaging: No results found.  Medications:  Scheduled: . aspirin  81 mg Oral q morning - 10a  . cefTRIAXone (ROCEPHIN)  IV  2 g Intravenous Q24H  . cloNIDine  0.2 mg Transdermal Weekly  . fluconazole  200 mg Oral QHS  . heparin  5,000 Units Subcutaneous 3 times per day  . insulin aspart  0-15 Units Subcutaneous TID WC  . insulin glargine  10 Units Subcutaneous Daily  . levothyroxine  75 mcg Oral QAC breakfast  . metoprolol  5 mg Intravenous 4 times per day  . QUEtiapine  25 mg Oral BID  . sodium chloride  3 mL Intravenous Q12H    Felicie Morn PA-C [facility] [phone]  02/07/2015, 9:40 AM  Patient seen and examined.  Clinical course and management discussed.  Necessary edits performed.  I agree with the above.  Assessment and plan of care developed and discussed below.    Assessment/Plan: Mental status continues to improve.  EEG showed slowing but no epileptiform activity.  WBC today is normal at 6.8.  Remains hyponatremic.    Recommendations: 1.  Will continue to follow with you    Thana Farr, MD [facility] [phone]  02/07/2015  11:49 AM

## 2015-02-07 NOTE — Progress Notes (Signed)
02/07/2015 10:40 AM  Per shift report, patient has a healing stage two area to the sacrum.  Upon assessment, there is a scabbed area to the sacrum measuring approximately 1.5X1.5X0cm.  Unsure if this was actually a pressure ulcer to start with because the surrounding area is reddened but blancheable with slight MASD noted to periwound.  Foam dressing applied to area regardless.    Theadora Rama

## 2015-02-07 NOTE — Progress Notes (Signed)
Rehab Admissions Coordinator Note:  Patient was screened by [name] L for appropriateness for an Inpatient Acute Rehab Consult.  At this time, we are recommending Inpatient Rehab consult.  [name] L 02/07/2015, 12:02 PM  I can be reached at [phone].

## 2015-02-07 NOTE — Evaluation (Signed)
Physical Therapy Evaluation Patient Details Name: Taylor [name] MRN: [id number] DOB: Jul 10, 1971 Today's Date: 02/07/2015   History of Present Illness  44 y/o admitted with DKA and possible shingles on his head. 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 02/03/2015  I&D of penialscrotal abscess   Clinical Impression  Pt admitted with above diagnosis. Pt currently with functional limitations due to the deficits listed below (see PT Problem List). At the time of PT eval pt was able to perform transfers and ambulation with +2 assist for support and safety. Pt would benefit from attempting with walker next session. Pt will benefit from skilled PT to increase their independence and safety with mobility to allow discharge to the venue listed below.  Pt is a good candidate for a CIR stay to progress to a Mod I level prior to return home.       Follow Up Recommendations CIR;Supervision/Assistance - 24 hour    Equipment Recommendations  TBD by next venue of care   Recommendations for Other Services Rehab consult     Precautions / Restrictions Precautions Precautions: Fall Precaution Comments: R forefoot amputation; has had falls this admission Restrictions Weight Bearing Restrictions: No      Mobility  Bed Mobility Overal bed mobility: Needs Assistance Bed Mobility: Supine to Sit     Supine to sit: Mod assist;HOB elevated     General bed mobility comments: Pt was able to transition to EOB with use of bed rail for support and bed pad to complete scooting. VC's for sequencing and technique.   Transfers Overall transfer level: Needs assistance Equipment used: 2 person hand held assist Transfers: Sit to/from Stand Sit to Stand: Mod assist         General transfer comment: Pt required +1 assist to stand  at EOB, and +2 assist for improved posture and balance. Pt initially bracing legs against bed for support.   Ambulation/Gait Ambulation/Gait assistance: Mod assist;+2 physical assistance Ambulation Distance (Feet): 5 Feet Assistive device: 2 person hand held assist Gait Pattern/deviations: Step-to pattern;Decreased stride length;Shuffle;Trunk flexed Gait velocity: Decreased Gait velocity interpretation: Below normal speed for age/gender General Gait Details: Pt with heavy support through UE's on therapist. Pt with minimal floor clearance and often just slid his feet forward. Noted decreased DF bilaterally. Was able to take one step forward with step-through technique when specifically cued to do so.   Stairs            Wheelchair Mobility    Modified Rankin (Stroke Patients Only)       Balance Overall balance assessment: Needs assistance Sitting-balance support: Feet supported;No upper extremity supported Sitting balance-Leahy Scale: Fair     Standing balance support: Bilateral upper extremity supported;During functional activity Standing balance-Leahy Scale: Poor Standing balance comment: +2 assist required for dynamic activity.                              Pertinent Vitals/Pain Pain Assessment: No/denies pain    Home Living Family/patient expects to be discharged to:: Inpatient rehab Living Arrangements: Parent Available Help at Discharge: Available 24 hours/day Type of Home: House Home Access: Stairs to enter Entrance Stairs-Rails: None Entrance Stairs-Number of Steps: 1 Home Layout: One level        Prior Function Level of Independence: Independent with assistive device(s)  Comments: Pt states he either takes a shower or a bath at home and "most of the time" does not have any trouble standing up from the tub.     Hand Dominance   Dominant Hand: Right    Extremity/Trunk Assessment   Upper Extremity Assessment: Defer to OT  evaluation;Generalized weakness           Lower Extremity Assessment: Generalized weakness (Grossly 3+/5 to 4-/5 bilaterally.)      Cervical / Trunk Assessment: Normal  Communication   Communication: No difficulties  Cognition Arousal/Alertness: Awake/alert Behavior During Therapy: WFL for tasks assessed/performed Overall Cognitive Status: No family/caregiver present to determine baseline cognitive functioning (Slow to respond and decreased attention at times)                      General Comments      Exercises General Exercises - Lower Extremity Long Arc Quad: 10 reps      Assessment/Plan    PT Assessment Patient needs continued PT services  PT Diagnosis Difficulty walking;Generalized weakness   PT Problem List Decreased strength;Decreased range of motion;Decreased activity tolerance;Decreased balance;Decreased mobility;Decreased knowledge of use of DME;Decreased safety awareness;Decreased knowledge of precautions  PT Treatment Interventions DME instruction;Gait training;Stair training;Functional mobility training;Therapeutic activities;Therapeutic exercise;Neuromuscular re-education;Patient/family education   PT Goals (Current goals can be found in the Care Plan section) Acute Rehab PT Goals Patient Stated Goal: to go to rehab PT Goal Formulation: With patient Time For Goal Achievement: 02/21/15 Potential to Achieve Goals: Good    Frequency Min 3X/week   Barriers to discharge        Co-evaluation PT/OT/SLP Co-Evaluation/Treatment: Yes Reason for Co-Treatment: For patient/therapist safety PT goals addressed during session: Mobility/safety with mobility;Balance OT goals addressed during session: ADL's and self-care;Strengthening/ROM       End of Session Equipment Utilized During Treatment: Gait belt Activity Tolerance: Patient limited by fatigue Patient left: in chair;with call bell/phone within reach;Other (comment) Psychiatrist notified that PT was  finished) Nurse Communication: Mobility status         Time: [phone] PT Time Calculation (min) (ACUTE ONLY): 20 min   Charges:   PT Evaluation $Initial PT Evaluation Tier I: 1 Procedure     PT G Codes:        Conni Slipper 2015/03/01, 11:50 AM   Conni Slipper, PT, DPT Acute Rehabilitation Services Pager: [phone]

## 2015-02-07 NOTE — Progress Notes (Signed)
[facility] PROGRESS NOTE  Taylor [name] APO:[id number] DOB: 01-22-1971 DOA: 02/02/2015  PCP: [name], MD  Brief HPI: 44 year old caucasian male with a past medical history of end-stage renal disease on hemodialysis on Tuesday, Thursday, Saturdays, type one diabetes with diabetic neuropathy, history of coronary artery disease status post CABG, history of penile prosthesis, status post infection with removal of hardware, who presented with a three-day history of nausea and vomiting. He was noted to have significantly elevated blood glucose with evidence for diabetic ketoacidosis. There was also suspicion of infection in his penis. Patient was admitted to the hospital for further management. Patient noted to have penoscrotal abscess. He underwent drainage in the OR.  Had a fall yesterday evening. Per staff patient has still has periods of confusion.  Past medical history:  Past Medical History  Diagnosis Date  . End stage renal disease on dialysis     LUE fistula  . Type I diabetes mellitus     a. 03/2014 admitted with HNK to [facility].  . Diabetic neuropathy     severe, s/p multiple toe amputation  . Hypothyroidism   . Hypertension   . Hyperlipidemia   . H/O hiatal hernia   . GERD (gastroesophageal reflux disease)   . Anxiety   . Sebaceous cyst     side of neck  . Pneumonia     2010  . Anemia     a. req PRBC's 2011.  [name] PAD (peripheral artery disease)     a. s/p amputation of toes on the right;  b. left LE claudication.  . Coronary artery disease     a. s/p MI;  b. 10/2009 CABG x 3 @ [facility]: LIMA->LAD, VG->OM3, VG->RPDA; c. 11/2010 Cath 3/3 patent grafts;  d 12/2012 Cath: LM 30d, LAD 85p, D1 70, D2 90, LCX 40ost, OM2 100, RCA 90p, 1109m, L->LAD ok, VG->OM3 ok, VG->RPDA 30, EF 50%->Med Rx.  . Cataract     right  . Valvular disease     a. 11/2012 Echo: EF 55-60%, mild LVH, mild MR, mild bi-atrial enlargement, mild-mod TR, PASP .  . CHF (congestive heart failure)   .  Depression   . Arthritis     rheumatoid arthritis   . Hx of transfusion of packed red blood cells   . Myocardial infarction 2010  . Anginal pain     Consultants: Nephrology, Urology, Neurology  Procedures:  Hemodialysis TTS  I&D of Penoscrotal Abscess 3/27  EEG Pending  Antibiotics: Cefepime 3/26 Diflucan 3/26 Valtrex 3/26. Stopped on 3/27  Subjective: Patient appears to be less agitated today.  oriented but looks lethargic  Objective: Vital Signs  Filed Vitals:   02/07/15 0730 02/07/15 0956 02/07/15 1405 02/07/15 1612  BP: 108/57 128/67 109/57 133/75  Pulse: 85 77 78 79  Temp: 97.8 F (36.6 C) 97.8 F (36.6 C) 97.6 F (36.4 C) 97.5 F (36.4 C)  TempSrc: Oral Oral Oral Oral  Resp: 18 18 16 17   Height:      Weight:      SpO2: 99% 100% 97% 100%    Intake/Output Summary (Last 24 hours) at 02/07/15 1806 Last data filed at 02/07/15 1358  Gross per 24 hour  Intake   1010 ml  Output    276 ml  Net    734 ml    General appearance: Patient looks lethargic, somnolent Resp: clear to auscultation bilaterally with reduced air entry at the bases. No crackles or wheezing. Cardio: regular rate and rhythm, S1, S2  normal, no murmur, click, rub or gallop GI: soft, non-tender; bowel sounds normal; no masses,  no organomegaly GU: Scrotal area and Penis covered in dressing. Foley noted.  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 Neurologic: less agitated compared to yesterday. But still appears to be delirious. No focal weakness on examination.   Lab Results:  Basic Metabolic Panel:  Recent Labs Lab 02/04/15 0116 02/04/15 0620 02/05/15 0307 02/06/15 0722 02/07/15 0825  NA 133* 133* 137 130* 129*  K 3.8 3.9 3.8 4.2 4.1  CL 95* 97 100 91* 94*  CO2 23 23 24 22 23   GLUCOSE 134* 109* 97 70 342*  BUN 24* 25* 14 24* 20  CREATININE 4.73* 4.81* 3.58* 4.68* 4.00*  CALCIUM 9.2 9.0 9.2 9.5 8.8  PHOS  --   --  4.0 4.7* 3.3   Liver Function  Tests:  Recent Labs Lab 02/02/15 0218 02/02/15 0509 02/04/15 1921 02/05/15 0307 02/06/15 0722 02/07/15 0825  AST 17 24 48*  --   --   --   ALT 10 8 17   --   --   --   ALKPHOS 282* 244* 241*  --   --   --   BILITOT 2.3* 3.2* 1.5*  --   --   --   PROT 7.6 7.0 7.0  --   --   --   ALBUMIN 3.2* 2.8* 2.8* 2.5* 2.5* 2.2*   CBC:  Recent Labs Lab 02/02/15 0218  02/03/15 0323 02/03/15 0751 02/04/15 0620 02/05/15 0307 02/06/15 0721 02/07/15 0825  WBC 14.0*  < > 7.8  --  8.7 7.3 11.6* 6.8  NEUTROABS 12.8*  --   --   --   --   --   --   --   HGB 11.2*  < > 10.1* 11.9* 11.5* 11.3* 11.0* 10.7*  HCT 37.1*  < > 31.6* 35.0* 36.5* 35.5* 33.8* 32.6*  MCV 94.4  < > 89.0  --  90.3 90.8 86.9 88.6  PLT 238  < > 136*  --  132* 121* 177 117*  < > = values in this interval not displayed. CBG:  Recent Labs Lab 02/06/15 1728 02/06/15 2204 02/07/15 0725 02/07/15 1134 02/07/15 1634  GLUCAP 269* 293* 351* 282* 277*    Studies/Results: No results found.  Medications:  Scheduled: . aspirin  81 mg Oral q morning - 10a  . cefTRIAXone (ROCEPHIN)  IV  2 g Intravenous Q24H  . cloNIDine  0.2 mg Transdermal Weekly  . fluconazole  200 mg Oral QHS  . heparin  5,000 Units Subcutaneous 3 times per day  . insulin aspart  0-15 Units Subcutaneous TID WC  . insulin glargine  10 Units Subcutaneous Daily  . levothyroxine  75 mcg Oral QAC breakfast  . metoprolol  5 mg Intravenous 4 times per day  . QUEtiapine  25 mg Oral BID  . sodium chloride  3 mL Intravenous Q12H   Continuous:   02/09/15  Assessment/Plan:  Principal Problem:   DKA (diabetic ketoacidoses) Active Problems:   Hypothyroidism   MYOCARDIAL INFARCTION, HX OF   End stage renal disease 2/2 to DM ty 1-Dialysis started ~2009--Dialyses t/Th/Sat-[place]   S/P CABG (coronary artery bypass graft)   HTN (hypertension)   Penile abscess   Panic attacks   Organic sexual dysfunction   Cellulitis of groin   Sepsis affecting  skin   DKA, type 1   Accelerated hypertension   Scrotal abscess   Sepsis   Altered mental  state   Acute delirium   PenoScrotal Abscess with Sepsis (with other GU abnormalities noted on CT) Patient is s/p I&D in the OR on 3/27. Cultures revealed  Escherichia coli sensitive to ceftriaxone. Change to to ceftriaxone. Continue Diflucan for now. Lactic acid level was improving. At presentation he was noted to have yellowish discharge around his penis. Urology was consulted. A CT scan was done which showed abscesses.   Acute Encephalopathy metabolic/Agitation Could be slightly improved today. Discontinued Ativan and Haldol. QT interval is normal. Started on Seroquel with good improvement with agitation. CT scan Head was unremarkable for any acute process. TSH was noted to be elevated but free T4 is normal. Neurology is following and we appreciate their input. EEG mild slowing. According to the family there is no history of alcoholism, pain medications on a regular basis. He is on multiple psychotropic agents for history of depression.  Severe DKA (diabetic ketoacidoses) in the setting of type 1 diabetes Resolved. Initial ABG revealed a pH of 7.1. Initially treated with intravenous insulin. He was changed over to SQ insulin after his anion gap closed. DKA was likely precipitated by penile abscess. HbA1c was 15.4 in February.   Hypoglycemia held Lantus today patient has decreased by mouth intake. Continue the sliding scale insulin  Diabetes mellitus insulin-dependent poorly controlled hemoglobin A1c is 15.4. Concern about noncompliance with the diet as well as insulin administration.  Hyponatremia hypovolemic hyponatremia. Discontinue IV fluids.  Abnormal appearing colon including cecum on CT scan This will need further evaluation once he is more stable from the above issues. There is no history of any diarrhea. We'll need to discuss with the patient regarding any history of endoscopies when he is  not agitated.  Hypothyroidism Continue Synthroid. TSH was elevated at 12, but free T4 is normal.   Coronary artery disease status post CABG Stable. Continue aspirin.   End stage renal disease 2/2 to DM 1 on dialysis on Tuesday, Thursday, Saturday Nephrology is following and he is being dialyzed as per them. Unclear reason why patient was on Lasix 80 mg twice a day at home given end-stage renal disease. Holding for now.   Accelerated Hypertension BP remains very high likely because his meds are held along with the fact that he is very agitated. Hydralazine PRN. He is on clonidine patch. Start on amlodipine. BB Q6h intravenously.   History of Panic attacks He is on multiple psychotropic agents at home. These are being held due to his alteration in mental status.  Possible shingles involving scalp Patient was started on Valtrex. Discussed with Dr. Arlean Hopping on 3/27. This med can cause confusion. Since we don't have a clear reason for it, will stop for now. Lesions on the scalp not conclusive.  DVT Prophylaxis: Heparin    Code Status: Full code  Family Communication: No family at bedside currently. Discussed with his father today over phone. Disposition Plan: Will remain in stepdown.    LOS: 5 days   [facility]  [facility] Pager [phone] 02/07/2015, 6:06 PM  If 7PM-7AM, please contact night-coverage at [email or web address], password [facility]

## 2015-02-08 ENCOUNTER — Other Ambulatory Visit: Payer: Self-pay | Admitting: *Deleted

## 2015-02-08 DIAGNOSIS — G9341 Metabolic encephalopathy: Secondary | ICD-10-CM

## 2015-02-08 LAB — CBC
HEMATOCRIT: 32.2 % — AB (ref 39.0–52.0)
Hemoglobin: 10.4 g/dL — ABNORMAL LOW (ref 13.0–17.0)
MCH: 28.7 pg (ref 26.0–34.0)
MCHC: 32.3 g/dL (ref 30.0–36.0)
MCV: 89 fL (ref 78.0–100.0)
Platelets: 121 10*3/uL — ABNORMAL LOW (ref 150–400)
RBC: 3.62 MIL/uL — ABNORMAL LOW (ref 4.22–5.81)
RDW: 16 % — AB (ref 11.5–15.5)
WBC: 7.7 10*3/uL (ref 4.0–10.5)

## 2015-02-08 LAB — RENAL FUNCTION PANEL
ANION GAP: 14 (ref 5–15)
Albumin: 2.2 g/dL — ABNORMAL LOW (ref 3.5–5.2)
BUN: 34 mg/dL — ABNORMAL HIGH (ref 6–23)
CHLORIDE: 91 mmol/L — AB (ref 96–112)
CO2: 22 mmol/L (ref 19–32)
Calcium: 8.8 mg/dL (ref 8.4–10.5)
Creatinine, Ser: 4.88 mg/dL — ABNORMAL HIGH (ref 0.50–1.35)
GFR, EST AFRICAN AMERICAN: 15 mL/min — AB (ref >90)
GFR, EST NON AFRICAN AMERICAN: 13 mL/min — AB (ref >90)
Glucose, Bld: 319 mg/dL — ABNORMAL HIGH (ref 70–99)
PHOSPHORUS: 4 mg/dL (ref 2.3–4.6)
POTASSIUM: 4.7 mmol/L (ref 3.5–5.1)
Sodium: 127 mmol/L — ABNORMAL LOW (ref 135–145)

## 2015-02-08 LAB — GLUCOSE, CAPILLARY
GLUCOSE-CAPILLARY: 170 mg/dL — AB (ref 70–99)
GLUCOSE-CAPILLARY: 521 mg/dL — AB (ref 70–99)
Glucose-Capillary: 492 mg/dL — ABNORMAL HIGH (ref 70–99)

## 2015-02-08 LAB — GLUCOSE, RANDOM: GLUCOSE: 615 mg/dL — AB (ref 70–99)

## 2015-02-08 LAB — ANAEROBIC CULTURE

## 2015-02-08 MED ORDER — INSULIN GLARGINE 100 UNIT/ML ~~LOC~~ SOLN
10.0000 [IU] | Freq: Two times a day (BID) | SUBCUTANEOUS | Status: DC
  Administered 2015-02-08: 10 [IU] via SUBCUTANEOUS
  Filled 2015-02-08 (×3): qty 0.1

## 2015-02-08 MED ORDER — INSULIN GLARGINE 100 UNIT/ML ~~LOC~~ SOLN
10.0000 [IU] | Freq: Two times a day (BID) | SUBCUTANEOUS | Status: DC
Start: 2015-02-08 — End: 2015-02-08
  Filled 2015-02-08: qty 0.1

## 2015-02-08 MED ORDER — HYDROCODONE-ACETAMINOPHEN 5-325 MG PO TABS
1.0000 | ORAL_TABLET | Freq: Four times a day (QID) | ORAL | Status: DC | PRN
  Administered 2015-02-08 – 2015-02-11 (×9): 1 via ORAL
  Filled 2015-02-08 (×10): qty 1

## 2015-02-08 MED ORDER — INSULIN ASPART 100 UNIT/ML ~~LOC~~ SOLN
0.0000 [IU] | Freq: Three times a day (TID) | SUBCUTANEOUS | Status: DC
  Administered 2015-02-09: 7 [IU] via SUBCUTANEOUS
  Administered 2015-02-09: 2 [IU] via SUBCUTANEOUS
  Administered 2015-02-10: 5 [IU] via SUBCUTANEOUS
  Administered 2015-02-10: 2 [IU] via SUBCUTANEOUS
  Administered 2015-02-10: 7 [IU] via SUBCUTANEOUS
  Administered 2015-02-11: 1 [IU] via SUBCUTANEOUS
  Administered 2015-02-11: 2 [IU] via SUBCUTANEOUS

## 2015-02-08 MED ORDER — INSULIN ASPART 100 UNIT/ML ~~LOC~~ SOLN
3.0000 [IU] | Freq: Three times a day (TID) | SUBCUTANEOUS | Status: DC
  Administered 2015-02-09 – 2015-02-11 (×7): 3 [IU] via SUBCUTANEOUS

## 2015-02-08 MED ORDER — INSULIN ASPART 100 UNIT/ML ~~LOC~~ SOLN
15.0000 [IU] | Freq: Once | SUBCUTANEOUS | Status: AC
  Administered 2015-02-08: 15 [IU] via SUBCUTANEOUS

## 2015-02-08 MED ORDER — INSULIN DETEMIR 100 UNIT/ML ~~LOC~~ SOLN: 10.0000 [IU] | Freq: Every day | SUBCUTANEOUS | Status: DC

## 2015-02-08 MED ORDER — CEPHALEXIN 500 MG PO CAPS
500.0000 mg | ORAL_CAPSULE | Freq: Four times a day (QID) | ORAL | Status: DC
  Administered 2015-02-08 – 2015-02-11 (×14): 500 mg via ORAL
  Filled 2015-02-08 (×16): qty 1

## 2015-02-08 MED ORDER — INSULIN ASPART 100 UNIT/ML ~~LOC~~ SOLN
0.0000 [IU] | Freq: Every day | SUBCUTANEOUS | Status: DC
  Administered 2015-02-09: 4 [IU] via SUBCUTANEOUS

## 2015-02-08 MED ORDER — METOPROLOL TARTRATE 25 MG PO TABS
25.0000 mg | ORAL_TABLET | Freq: Two times a day (BID) | ORAL | Status: DC
  Administered 2015-02-08 – 2015-02-11 (×7): 25 mg via ORAL
  Filled 2015-02-08 (×8): qty 1

## 2015-02-08 MED ORDER — INSULIN ASPART 100 UNIT/ML ~~LOC~~ SOLN
10.0000 [IU] | Freq: Once | SUBCUTANEOUS | Status: AC
  Administered 2015-02-08: 10 [IU] via SUBCUTANEOUS

## 2015-02-08 NOTE — Progress Notes (Signed)
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 Dialysis at the [facility] unit. Now with recurrent L corporal infection at the penile base.   Pt has normal urethra, and still makes enough urine to void each spontaneously each day. Note MRSA NEG consistently; with wound gram stain showing gram NEG Cocci with culture showing E.coli sensitive to ceftriaxone, which was started 3/29. Also on fluconazole for yeast in gram stain.  Interval: antibiotics de-escalated to keflex. Wet-dry dressing changes BID. WBC normalized. Receiving dialysis per nephrology.  Objective: Vital signs in last 24 hours: Temp:  [96.8 F (36 C)-98 F (36.7 C)] 96.8 F (36 C) (04/01 0740) Pulse Rate:  [76-85] 78 (04/01 1136) Resp:  [17-25] 22 (04/01 1100) BP: (113-141)/(65-89) 141/84 mmHg (04/01 1136) SpO2:  [100 %] 100 % (04/01 0743) Weight:  [168 lb 3.4 oz (76.3 kg)-173 lb 8 oz (78.7 kg)] 168 lb 3.4 oz (76.3 kg) (04/01 1136)  Intake/Output from previous day: 03/31 0701 - 04/01 0700 In: 1010 [P.O.:960; IV Piggyback:50] Out: 300 [Urine:300] Intake/Output this shift: Total I/O In: -  Out: 3000 [Other:3000]  Physical Exam:  General: depressed mental status c/w encephalopathy Penis: firm, indurated shafts bilaterally. Purulence expressed from incision site on right glans. 3 cm tunnel when probed. Packed with iodoform gauze. Drain exiting left shaft skin. Purulent fluid around the foley catheter that can be expressed. Scrotum: open I&D site at base of left side of  penis/left hemiscrotum. Brown fluid draining. No necrotic tissue noted. Soaked dressing was removed and replaced.   Lab Results: CBC Latest Ref Rng 02/08/2015 02/07/2015 02/06/2015  WBC 4.0 - 10.5 K/uL 7.7 6.8 11.6(H)  Hemoglobin 13.0 - 17.0 g/dL 10.4(L) 10.7(L) 11.0(L)  Hematocrit 39.0 - 52.0 % 32.2(L) 32.6(L) 33.8(L)  Platelets 150 - 400 K/uL 121(L) 117(L) 177    BMP Latest Ref Rng 02/08/2015 02/07/2015 02/06/2015  Glucose 70 - 99 mg/dL 782(N) 562(Z) 70  BUN 6 - 23 mg/dL 30(Q) 20 65(H)  Creatinine 0.50 - 1.35 mg/dL 8.46(N) 6.29(B) 2.84(X)  BUN/Creat Ratio 9 - 20 - - -  Sodium 135 - 145 mmol/L 127(L) 129(L) 130(L)  Potassium 3.5 - 5.1 mmol/L 4.7 4.1 4.2  Chloride 96 - 112 mmol/L 91(L) 94(L) 91(L)  CO2 19 - 32 mmol/L 22 23 22   Calcium 8.4 - 10.5 mg/dL 8.8 8.8 9.5    No results for input(s): LABPT, INR in the last 72 hours. No results for input(s): LABURIN in the last 72 hours. Results for orders placed or performed during the hospital encounter of 02/02/15  Urine culture     Status: None   Collection Time: 02/02/15  3:57 AM  Result Value Ref Range Status   Specimen Description URINE, RANDOM  Final   Special Requests NONE  Final   Colony Count   Final    >=100,000 COLONIES/ML Performed at 02/04/15    Culture   Final    Multiple bacterial morphotypes present, none predominant. Suggest appropriate recollection if clinically indicated. Performed at [facility]    Report Status 02/04/2015 FINAL  Final  Stat Gram stain     Status: None  Collection Time: 02/02/15 10:50 AM  Result Value Ref Range Status   Specimen Description PENILE DRAINAGE  Final   Special Requests Immunocompromised  Final   Gram Stain   Final    ABUNDANT WBC PRESENT,BOTH PMN AND MONONUCLEAR ABUNDANT GRAM NEGATIVE COCCI IN DIPLOS Gram Stain Report Called to,Read Back By and Verified With: A THOMPSON,RN 02/02/15 1207 [facility]    Report Status 02/02/2015 FINAL  Final  Wound culture     Status:  None   Collection Time: 02/02/15 10:50 AM  Result Value Ref Range Status   Specimen Description PENILE DRAINAGE  Final   Special Requests Immunocompromised  Final   Gram Stain   Final    ABUNDANT WBC PRESENT,BOTH PMN AND MONONUCLEAR ABUNDANT GRAM NEGATIVE COCCI Gram Stain Report Called to,Read Back By and Verified With: Gram Stain Report Called to,Read Back By and Verified With: A THOMPSON RN 02/02/15 1207 [facility] Performed at [facility]    Culture   Final    MULTIPLE ORGANISMS PRESENT, NONE PREDOMINANT Note: NO STAPHYLOCOCCUS AUREUS ISOLATED NO GROUP A STREP (S.PYOGENES) ISOLATED Performed at [facility]    Report Status 02/05/2015 FINAL  Final  MRSA PCR Screening     Status: None   Collection Time: 02/02/15  5:13 PM  Result Value Ref Range Status   MRSA by PCR NEGATIVE NEGATIVE Final    Comment:        The GeneXpert MRSA Assay (FDA approved for NASAL specimens only), is one component of a comprehensive MRSA colonization surveillance program. It is not intended to diagnose MRSA infection nor to guide or monitor treatment for MRSA infections.   Surgical pcr screen     Status: Abnormal   Collection Time: 02/03/15  6:27 AM  Result Value Ref Range Status   MRSA, PCR NEGATIVE NEGATIVE Final   Staphylococcus aureus POSITIVE (A) NEGATIVE Final    Comment:        The Xpert SA Assay (FDA approved for NASAL specimens in patients over 32 years of age), is one component of a comprehensive surveillance program.  Test performance has been validated by [facility] for patients greater than or equal to 8 year old. It is not intended to diagnose infection nor to guide or monitor treatment.   Anaerobic culture     Status: None   Collection Time: 02/03/15  8:29 AM  Result Value Ref Range Status   Specimen Description ABSCESS  Final   Special Requests PENIS  Final   Gram Stain   Final    ABUNDANT WBC PRESENT, PREDOMINANTLY PMN RARE SQUAMOUS EPITHELIAL CELLS  PRESENT FEW YEAST ABUNDANT GRAM NEGATIVE RODS Performed at [facility]    Culture   Final    NO ANAEROBES ISOLATED Performed at [facility]    Report Status 02/08/2015 FINAL  Final  Culture, routine-abscess     Status: None   Collection Time: 02/03/15  8:29 AM  Result Value Ref Range Status   Specimen Description ABSCESS  Final   Special Requests PENIS  Final   Gram Stain   Final    MODERATE WBC PRESENT, PREDOMINANTLY PMN RARE SQUAMOUS EPITHELIAL CELLS PRESENT FEW YEAST ABUNDANT GRAM NEGATIVE RODS Performed at [facility]    Culture   Final    MODERATE ESCHERICHIA COLI Performed at [facility]    Report Status 02/05/2015 FINAL  Final   Organism ID, Bacteria ESCHERICHIA COLI  Final      Susceptibility   Escherichia  coli - MIC*    AMPICILLIN >=32 RESISTANT Resistant     AMPICILLIN/SULBACTAM 16 INTERMEDIATE Intermediate     CEFAZOLIN <=4 SENSITIVE Sensitive     CEFEPIME <=1 SENSITIVE Sensitive     CEFTAZIDIME <=1 SENSITIVE Sensitive     CEFTRIAXONE <=1 SENSITIVE Sensitive     CIPROFLOXACIN >=4 RESISTANT Resistant     GENTAMICIN <=1 SENSITIVE Sensitive     IMIPENEM <=0.25 SENSITIVE Sensitive     PIP/TAZO <=4 SENSITIVE Sensitive     TOBRAMYCIN <=1 SENSITIVE Sensitive     TRIMETH/SULFA >=320 RESISTANT Resistant     * MODERATE ESCHERICHIA COLI    Studies/Results: No results found.  Assessment/Plan: Improvement with aggressive I/S and cefepime. Now on keflex. Will leave drain and follow wound.   Change wet-to-dry dressings BID at base of penis as well as iodoform gauze into right shaft space BID.    LOS: 6 days   Laural Benes, Tayah Idrovo C 02/08/2015, 2:15 PM

## 2015-02-08 NOTE — Telephone Encounter (Signed)
Received fax Rx renewal from [facility] for Carvedilol 25 mg tablet.   Patient is currently in the hospital and this is not a medication listed on his hospital medications but is listed on his outpatient medication list.  Refill?

## 2015-02-08 NOTE — Progress Notes (Signed)
I spoke with pt at bedside with his sitter. He is requesting SNF rehab closer to home. Specifically requests. [facility], [facility], or [place]. I have alerted RN CM and SW. [phone]

## 2015-02-08 NOTE — Telephone Encounter (Signed)
I have not been prescribing this.  His cardiologist likely has been.

## 2015-02-08 NOTE — Progress Notes (Signed)
[facility] Progress Note   Subjective:  Seen in dialysis and is much more alert Will HD today (and tomorrow just for 3 hours) and get back on schedule More alert and appropriate Denies pain   Filed Vitals:   02/07/15 1612 02/07/15 2111 02/07/15 2350 02/08/15 0502  BP: 133/75 124/65 123/68 125/75  Pulse: 79 78 78 80  Temp: 97.5 F (36.4 C) 98 F (36.7 C)  98 F (36.7 C)  TempSrc: Oral Oral  Oral  Resp: 17 17  17   Height:  5\' 8"  (1.727 m)    Weight:  78.7 kg (173 lb 8 oz)    SpO2: 100% 100%  100%    Physical Exam: BP 125/75 mmHg  Pulse 80  Temp(Src) 98 F (36.7 C) (Oral)  Resp 17  Ht 5\' 8"  (1.727 m)  Wt 78.7 kg (173 lb 8 oz)  BMI 26.39 kg/m2  SpO2 100%  Falling asleep on dialysis but today wakes up and carries on a more appropriate conversation No JVD Chest clear bilaterally  S1S2 Widely split S2 No S3 no MRG Left upper arm AVF cannulated at this time Abd soft, NTND, no mass or ascites GU large dressing over penis/ scrotum, drain and foley in place 1+ LE edema pretib bilat with scabs/excoriation over right pre-tib region.  Right TMA, absent L great toe, scabs RLE Neuro is awake, more appropriate   Recent Labs Lab 02/05/15 0307 02/06/15 0722 02/07/15 0825  NA 137 130* 129*  K 3.8 4.2 4.1  CL 100 91* 94*  CO2 24 22 23   GLUCOSE 97 70 342*  BUN 14 24* 20  CREATININE 3.58* 4.68* 4.00*  CALCIUM 9.2 9.5 8.8  PHOS 4.0 4.7* 3.3    Recent Labs Lab 02/02/15 0218 02/02/15 0509 02/04/15 1921 02/05/15 0307 02/06/15 0722 02/07/15 0825  AST 17 24 48*  --   --   --   ALT 10 8 17   --   --   --   ALKPHOS 282* 244* 241*  --   --   --   BILITOT 2.3* 3.2* 1.5*  --   --   --   PROT 7.6 7.0 7.0  --   --   --   ALBUMIN 3.2* 2.8* 2.8* 2.5* 2.5* 2.2*    Recent Labs Lab 02/02/15 0218  02/05/15 0307 02/06/15 0721 02/07/15 0825  WBC 14.0*  < > 7.3 11.6* 6.8  NEUTROABS 12.8*  --   --   --   --   HGB 11.2*  < > 11.3* 11.0* 10.7*  HCT 37.1*  < >  35.5* 33.8* 32.6*  MCV 94.4  < > 90.8 86.9 88.6  PLT 238  < > 121* 177 117*  < > = values in this interval not displayed.   Medications . aspirin  81 mg Oral q morning - 10a  . cefTRIAXone (ROCEPHIN)  IV  2 g Intravenous Q24H  . cloNIDine  0.2 mg Transdermal Weekly  . fluconazole  200 mg Oral QHS  . heparin  5,000 Units Subcutaneous 3 times per day  . insulin aspart  0-15 Units Subcutaneous TID WC  . insulin glargine  10 Units Subcutaneous Daily  . levothyroxine  75 mcg Oral QAC breakfast  . metoprolol  5 mg Intravenous 4 times per day  . QUEtiapine  25 mg Oral BID  . sodium chloride  3 mL Intravenous Q12H   HD: TTS [facility] 3h  80kg   Heparin none  L arm AVF Will have lower EDW at discharge 02/04/15 Weight 77.9 pre/75.6 post 02/06/15 Weight  76.5 pre/74.3 post  Assessment/Recommendations  1. Penile/ scrotal abcess (recurrent)  - Hx penile abscess complicating penile implant placement 07/2014; implant removed Oct '15 d/t infection -  s/p I&D in the OR.  Culture + EColi. On rocephin/fluconazole/dressing changes  2. 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 ESRD on HD - normal schedule TTS.  Off schedule. Will get back onto correct TTS schedule with HD again tomorrow for 3 hours only.   He will have a lower EDW at discharge.  Edema is looking better. 4.  5. HTN on catapres patch TTS1 and IV metoprolol QID (?) I changed metoprolol to po 6. Volume -  +vol excess w LE edema - better as we lower EDW 7. DKA / DM  - back on SQ insulin 8. PAD hx toe amps and TMA 9. CAD prior CABG 10. Hypothyroidism on replacement 11. AMS - encephalopathy improving  Camille Bal, MD [facility] [phone] Pager 02/08/2015, 8:23 AM

## 2015-02-08 NOTE — Telephone Encounter (Signed)
Will forward refill request to Dr. Kirke Corin.

## 2015-02-08 NOTE — Progress Notes (Signed)
Inpatient Diabetes Program Recommendations  AACE/ADA: New Consensus Statement on Inpatient Glycemic Control (2013)  Target Ranges:  Prepandial:   less than 140 mg/dL      [facility] postprandial:   less than 180 mg/dL (1-2 hours)      Critically ill patients:  140 - 180 mg/dL     Results for ELWOOD, BAZINET (MRN [id number]) as of 02/08/2015 11:58  Ref. Range 02/06/2015 11:18 02/06/2015 11:50 02/06/2015 12:32 02/06/2015 17:28 02/06/2015 22:04  Glucose-Capillary Latest Range: 70-99 mg/dL 53 (L) 56 (L) 94 607 (H) 293 (H)    Results for ALOYSIOUS, VANGIESON (MRN [id number]) as of 02/08/2015 11:58  Ref. Range 02/07/2015 07:25 02/07/2015 11:34 02/07/2015 16:34 02/07/2015 21:10  Glucose-Capillary Latest Range: 70-99 mg/dL 854 (H) 627 (H) 035 (H) 198 (H)    Admit with: DKA  History: DM Type 1, ESRD, HTN, CAD, CHF  Home DM Meds: Lantus 30 units daily  Novolog 7-17 units tid per SSI  Current DM Orders: Lantus 10 units daily  Novolog Moderate SSI tid    **Patient getting dialysis today.  **Having elevated postprandial glucose levels after meals- Eating 75-100% of meals.  **Note Lantus 10 units was Held on 03/30 b/c patient was having Hypoglycemia.  As a result, patient's glucose levels all day yesterday.    MD- Please consider adding low dose Novolog Meal Coverage-  Novolog 3 units tid with meals (hold of pt eats <50% of meal, hold if pt NPO)   Will follow Ambrose Finland RN, MSN, CDE Diabetes Coordinator Inpatient Diabetes Program Team Pager: [phone] (8a-5p)

## 2015-02-08 NOTE — Progress Notes (Signed)
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    Assessment/Plan:  Penile scrotal abscess with sepsis: Status post LAD on 02/03/2015 cultures revealed Escherichia coli sensitive to Rocephin and cefazolin. Change to keflex. Lactic acidosis improved with IV hydration and axis removal. Appreciate urology's assistance.  Acute encephalopathy/acute confusional state: Ativan and Haldol were discontinued he was started on Seroquel. CT of the head was unremarkable. Neurology was consulted. EEG showed slowing to form activity.  Severe DKA: Initially treated with IV insulin. Her acidosis resolved this was most likely triggered due to his abscess. His last hemoglobin A1c was 15. We'll continue titrating his insulin. Poorly controlled at home will need better management.  Hypothyroidism: Continue Synthroid.  End stage renal disease due to diabetes: Nephrology following.  Further management per nephrology.  Accelerated hypertension: He is back on his clonidine and metoprolol and his blood pressure slowly improved.  DVT Prophylaxis: Heparin  Code Status: Full code  Family Communication: No family at bedside currently. Discussed with his father today over phone. Disposition Plan: Will remain in stepdown.  Consultants:  Renal  Urology  neurology  Procedures:  HD  I&D of penoscrotal abscess 3.27.2016  Antibiotics:  rocephin (indicate start date, and stop date if known)  HPI/Subjective: Feels better at baseline  Objective: Filed Vitals:   02/08/15 0930 02/08/15 1000 02/08/15 1030 02/08/15 1100  BP: 127/76 123/85 133/76 137/81  Pulse: 81 85 76 79  Temp:      TempSrc:      Resp: 25  18 19 22   Height:      Weight:      SpO2:        Intake/Output Summary (Last 24 hours) at 02/08/15 1209 Last data filed at 02/08/15 0600  Gross per 24 hour  Intake   1010 ml  Output    300 ml  Net    710 ml   Filed Weights   02/06/15 2027 02/07/15 2111 02/08/15 0740  Weight: 77.293 kg (170 lb 6.4 oz) 78.7 kg (173 lb 8 oz) 78.6 kg (173 lb 4.5 oz)    Exam:  General: Alert, awake, oriented x3, in no acute distress.  HEENT: No bruits, no goiter.  Heart: Regular rate and rhythm. Lungs: Good air movement, clear  Abdomen: Soft, nontender, nondistended, positive bowel sounds.    Data Reviewed: Basic Metabolic Panel:  Recent Labs Lab 02/04/15 0620 02/05/15 0307 02/06/15 0722 02/07/15 0825 02/08/15 0829  NA 133* 137 130* 129* 127*  K 3.9 3.8 4.2 4.1 4.7  CL 97 100 91* 94* 91*  CO2 23 24 22 23 22   GLUCOSE 109* 97 70 342* 319*  BUN 25* 14 24* 20 34*  CREATININE 4.81* 3.58* 4.68* 4.00* 4.88*  CALCIUM 9.0 9.2 9.5 8.8 8.8  PHOS  --  4.0 4.7* 3.3 4.0   Liver Function Tests:  Recent Labs Lab 02/02/15 0218 02/02/15 0509 02/04/15 1921 02/05/15 0307 02/06/15 0722 02/07/15 0825 02/08/15 0829  AST 17 24 48*  --   --   --   --   ALT 10 8 17   --   --   --   --   ALKPHOS 282* 244* 241*  --   --   --   --   BILITOT  2.3* 3.2* 1.5*  --   --   --   --   PROT 7.6 7.0 7.0  --   --   --   --   ALBUMIN 3.2* 2.8* 2.8* 2.5* 2.5* 2.2* 2.2*   No results for input(s): LIPASE, AMYLASE in the last 168 hours. No results for input(s): AMMONIA in the last 168 hours. CBC:  Recent Labs Lab 02/02/15 0218  02/04/15 0620 02/05/15 0307 02/06/15 0721 02/07/15 0825 02/08/15 0829  WBC 14.0*  < > 8.7 7.3 11.6* 6.8 7.7  NEUTROABS 12.8*  --   --   --   --   --   --   HGB 11.2*  < > 11.5* 11.3* 11.0* 10.7* 10.4*  HCT 37.1*  < > 36.5* 35.5* 33.8* 32.6* 32.2*  MCV 94.4  < > 90.3 90.8 86.9 88.6 89.0  PLT 238  < > 132* 121* 177 117* 121*  < > = values in this interval not displayed. Cardiac  Enzymes: No results for input(s): CKTOTAL, CKMB, CKMBINDEX, TROPONINI in the last 168 hours. BNP (last 3 results) No results for input(s): BNP in the last 8760 hours.  ProBNP (last 3 results)  Recent Labs  03/26/14 1555  PROBNP 1212.0*    CBG:  Recent Labs Lab 02/06/15 2204 02/07/15 0725 02/07/15 1134 02/07/15 1634 02/07/15 2110  GLUCAP 293* 351* 282* 277* 198*    Recent Results (from the past 240 hour(s))  Urine culture     Status: None   Collection Time: 02/02/15  3:57 AM  Result Value Ref Range Status   Specimen Description URINE, RANDOM  Final   Special Requests NONE  Final   Colony Count   Final    >=100,000 COLONIES/ML Performed at [facility]    Culture   Final    Multiple bacterial morphotypes present, none predominant. Suggest appropriate recollection if clinically indicated. Performed at [facility]    Report Status 02/04/2015 FINAL  Final  Stat Gram stain     Status: None   Collection Time: 02/02/15 10:50 AM  Result Value Ref Range Status   Specimen Description PENILE DRAINAGE  Final   Special Requests Immunocompromised  Final   Gram Stain   Final    ABUNDANT WBC PRESENT,BOTH PMN AND MONONUCLEAR ABUNDANT GRAM NEGATIVE COCCI IN DIPLOS Gram Stain Report Called to,Read Back By and Verified With: A THOMPSON,RN 02/02/15 1207 M [facility]    Report Status 02/02/2015 FINAL  Final  Wound culture     Status: None   Collection Time: 02/02/15 10:50 AM  Result Value Ref Range Status   Specimen Description PENILE DRAINAGE  Final   Special Requests Immunocompromised  Final   Gram Stain   Final    ABUNDANT WBC PRESENT,BOTH PMN AND MONONUCLEAR ABUNDANT GRAM NEGATIVE COCCI Gram Stain Report Called to,Read Back By and Verified With: Gram Stain Report Called to,Read Back By and Verified With: A THOMPSON RN 02/02/15 1207 Judie Petit [facility] Performed at [facility]    Culture   Final    MULTIPLE ORGANISMS PRESENT, NONE PREDOMINANT Note: NO  STAPHYLOCOCCUS AUREUS ISOLATED NO GROUP A STREP (S.PYOGENES) ISOLATED Performed at [facility]    Report Status 02/05/2015 FINAL  Final  MRSA PCR Screening     Status: None   Collection Time: 02/02/15  5:13 PM  Result Value Ref Range Status   MRSA by PCR NEGATIVE NEGATIVE Final    Comment:        The GeneXpert MRSA Assay (  FDA approved for NASAL specimens only), is one component of a comprehensive MRSA colonization surveillance program. It is not intended to diagnose MRSA infection nor to guide or monitor treatment for MRSA infections.   Surgical pcr screen     Status: Abnormal   Collection Time: 02/03/15  6:27 AM  Result Value Ref Range Status   MRSA, PCR NEGATIVE NEGATIVE Final   Staphylococcus aureus POSITIVE (A) NEGATIVE Final    Comment:        The Xpert SA Assay (FDA approved for NASAL specimens in patients over 42 years of age), is one component of a comprehensive surveillance program.  Test performance has been validated by [facility] for patients greater than or equal to 58 year old. It is not intended to diagnose infection nor to guide or monitor treatment.   Anaerobic culture     Status: None   Collection Time: 02/03/15  8:29 AM  Result Value Ref Range Status   Specimen Description ABSCESS  Final   Special Requests PENIS  Final   Gram Stain   Final    ABUNDANT WBC PRESENT, PREDOMINANTLY PMN RARE SQUAMOUS EPITHELIAL CELLS PRESENT FEW YEAST ABUNDANT GRAM NEGATIVE RODS Performed at [facility]    Culture   Final    NO ANAEROBES ISOLATED Performed at [facility]    Report Status 02/08/2015 FINAL  Final  Culture, routine-abscess     Status: None   Collection Time: 02/03/15  8:29 AM  Result Value Ref Range Status   Specimen Description ABSCESS  Final   Special Requests PENIS  Final   Gram Stain   Final    MODERATE WBC PRESENT, PREDOMINANTLY PMN RARE SQUAMOUS EPITHELIAL CELLS PRESENT FEW YEAST ABUNDANT GRAM NEGATIVE  RODS Performed at [facility]    Culture   Final    MODERATE ESCHERICHIA COLI Performed at [facility]    Report Status 02/05/2015 FINAL  Final   Organism ID, Bacteria ESCHERICHIA COLI  Final      Susceptibility   Escherichia coli - MIC*    AMPICILLIN >=32 RESISTANT Resistant     AMPICILLIN/SULBACTAM 16 INTERMEDIATE Intermediate     CEFAZOLIN <=4 SENSITIVE Sensitive     CEFEPIME <=1 SENSITIVE Sensitive     CEFTAZIDIME <=1 SENSITIVE Sensitive     CEFTRIAXONE <=1 SENSITIVE Sensitive     CIPROFLOXACIN >=4 RESISTANT Resistant     GENTAMICIN <=1 SENSITIVE Sensitive     IMIPENEM <=0.25 SENSITIVE Sensitive     PIP/TAZO <=4 SENSITIVE Sensitive     TOBRAMYCIN <=1 SENSITIVE Sensitive     TRIMETH/SULFA >=320 RESISTANT Resistant     * MODERATE ESCHERICHIA COLI     Studies: No results found.  Scheduled Meds: . aspirin  81 mg Oral q morning - 10a  . cefTRIAXone (ROCEPHIN)  IV  2 g Intravenous Q24H  . cloNIDine  0.2 mg Transdermal Weekly  . fluconazole  200 mg Oral QHS  . heparin  5,000 Units Subcutaneous 3 times per day  . insulin aspart  0-15 Units Subcutaneous TID WC  . insulin glargine  10 Units Subcutaneous Daily  . levothyroxine  75 mcg Oral QAC breakfast  . metoprolol tartrate  25 mg Oral BID  . QUEtiapine  25 mg Oral BID  . sodium chloride  3 mL Intravenous Q12H   Continuous Infusions:    Marinda Elk  [facility] Pager [phone]. If 7PM-7AM, please contact night-coverage at [email or web address], password [email or web address] 02/08/2015, 12:09 PM  LOS: 6 days

## 2015-02-08 NOTE — Consult Note (Signed)
Physical Medicine and Rehabilitation Consult   Reason for Consult: Debility Referring Physician: Dr. Robb Matar   HPI: Taylor [name] is a 44 y.o. male with a history of ESRD on dialysis admitted with accelerated HTN in DKA, febrile with elevated WBC and metabolic acidosis due to penoscrotal abscess on 02/02/2015. he underwent cystoscopy with I and D of abscess on 03/27 by Dr. Berneice Heinrich and on IV cefepime. Patient has been confused and agitated and CT head was negative for acute changes. Dr. Thad Ranger consulted and felt confusion due to delirium from metabolic derangement and EEG was negative for focal epileptiform activity. Cultures positive for E coli and antibiotics changed to keflex.  Cognition is slowly improving but patient noted to be deconditioned. PT evaluation done 03/21 and CIR recommended for follow up therapy.    Review of Systems  Constitutional: Negative for fever and chills.  Eyes: Negative for blurred vision.  Cardiovascular: Negative for chest pain.  Gastrointestinal: Positive for diarrhea.  Psychiatric/Behavioral: Positive for memory loss.      Past Medical History  Diagnosis Date  . End stage renal disease on dialysis     LUE fistula  . Type I diabetes mellitus     a. 03/2014 admitted with HNK to [facility].  . Diabetic neuropathy     severe, s/p multiple toe amputation  . Hypothyroidism   . Hypertension   . Hyperlipidemia   . H/O hiatal hernia   . GERD (gastroesophageal reflux disease)   . Anxiety   . Sebaceous cyst     side of neck  . Pneumonia     2010  . Anemia     a. req PRBC's 2011.  [name] PAD (peripheral artery disease)     a. s/p amputation of toes on the right;  b. left LE claudication.  . Coronary artery disease     a. s/p MI;  b. 10/2009 CABG x 3 @ [facility]: LIMA->LAD, VG->OM3, VG->RPDA; c. 11/2010 Cath 3/3 patent grafts;  d 12/2012 Cath: LM 30d, LAD 85p, D1 70, D2 90, LCX 40ost, OM2 100, RCA 90p, 170m, L->LAD ok, VG->OM3 ok, VG->RPDA 30, EF 50%->Med  Rx.  . Cataract     right  . Valvular disease     a. 11/2012 Echo: EF 55-60%, mild LVH, mild MR, mild bi-atrial enlargement, mild-mod TR, PASP .  . CHF (congestive heart failure)   . Depression   . Arthritis     rheumatoid arthritis   . Hx of transfusion of packed red blood cells   . Myocardial infarction 2010  . Anginal pain     Past Surgical History  Procedure Laterality Date  . Dialysis fistula creation      left upper arm fistula  . Amputation      TOES ON BOTH FEET  . Abscess drainage  Behind right ear/occipital scalp  . Skin graft    . Eye surgery  1999  . Coronary artery bypass graft  10/2009    [facility] (Dr. Katrinka Blazing)  . Foot amputation    . Cardiac catheterization    . Cardiac catheterization  2/14    [facility]: severe 3 vessel CAD with patent grafts, RHC: moderately elevated PCW and pulmonary hypertension  . Penile prosthesis implant N/A 07/24/2014    Procedure: SALINE PENILE INJECTION WITH DISSECTION OF CORPORA ,  IMPLANTATION OF COLOPLAST PENILE PROTHESIS INFLATABLE;  Surgeon: [name], MD;  Location: [facility] ORS;  Service: Urology;  Laterality: N/A;  . Removal of penile prosthesis  N/A 08/22/2014    Procedure: EXPLANT OF INFECTED  PENILE PROSTHESIS;  Surgeon: [name], MD;  Location: [facility] ORS;  Service: Urology;  Laterality: N/A;  . Irrigation and debridement abscess Bilateral 12/14/2014    Procedure: Bilateral Corporal Irrigation with Cultures and Drainage, Penrose Drain Insertion;  Surgeon: [name], MD;  Location: [facility] OR;  Service: Urology;  Laterality: Bilateral;  . Scrotal exploration N/A 02/03/2015    Procedure: IRRIGATION AND DEBRIDEMENT SCROTAL/PENILE ABSCESS;  Surgeon: [name], MD;  Location: [facility];  Service: Urology;  Laterality: N/A;  . Cystoscopy N/A 02/03/2015    Procedure: CYSTOSCOPY;  Surgeon: [name], MD;  Location: [facility] OR;  Service: Urology;  Laterality: N/A;    Family History  Problem Relation Age of Onset  . Heart  disease Other   . Hypertension Other   . Hypertension Mother   . Heart disease Mother   . Diabetes Mother   . Birth defects Paternal Uncle     unaware  . Birth defects Paternal Grandmother     breast    Social History:  reports that he has never smoked. He quit smokeless tobacco use about 19 years ago. His smokeless tobacco use included Chew. He reports that he drinks alcohol. He reports that he does not use illicit drugs.    Allergies  Allergen Reactions  . Amoxicillin-Pot Clavulanate Nausea And Vomiting  . Hydromorphone Hcl     "body started down"  08/22/14 - pt states no longer has problems with this  . Rifampin Nausea And Vomiting    Medications Prior to Admission  Medication Sig Dispense Refill  . amitriptyline (ELAVIL) 25 MG tablet Take 1 tablet (25 mg total) by mouth at bedtime. 30 tablet 1  . aspirin 81 MG tablet Take 81 mg by mouth every morning.     . calcium carbonate (TUMS - DOSED IN MG ELEMENTAL CALCIUM) 500 MG chewable tablet Chew 1-2 tablets (200-400 mg of elemental calcium total) by mouth 2 (two) times daily as needed for indigestion or heartburn. 1 tablet 0  . carvedilol (COREG) 25 MG tablet Take 25 mg by mouth 2 (two) times daily with a meal.    . cinacalcet (SENSIPAR) 60 MG tablet Take 60 mg by mouth at bedtime. With biggest meal    . citalopram (CELEXA) 20 MG tablet TAKE 1 BY MOUTH DAILY (Patient taking differently: TAKE 1 BY MOUTH NIGHTLY AT BEDTIME.) 30 tablet 2  . cloNIDine (CATAPRES) 0.1 MG tablet Take 0.1 mg by mouth 2 (two) times daily.    . furosemide (LASIX) 80 MG tablet Take 80 mg by mouth 2 (two) times daily.    [name] HYDROcodone-acetaminophen (NORCO/VICODIN) 5-325 MG per tablet Take 1 tablet by mouth every 4 (four) hours as needed for moderate pain. 30 tablet 0  . insulin aspart (NOVOLOG) 100 UNIT/ML injection Inject 7 Units into the skin 3 (three) times daily with meals. (Patient taking differently: Inject 7-17 Units into the skin 3 (three) times daily  with meals. Sliding scale) 10 mL 11  . insulin glargine (LANTUS) 100 UNIT/ML injection Inject 0.3 mLs (30 Units total) into the skin daily. 10 mL 11  . isosorbide mononitrate (IMDUR) 30 MG 24 hr tablet Take 30 mg by mouth 2 (two) times daily.    [name] levothyroxine (SYNTHROID, LEVOTHROID) 75 MCG tablet Take 1 tablet (75 mcg total) by mouth daily before breakfast. 90 tablet 1  . LYRICA 150 MG capsule TAKE 1 CAPSULE BY MOUTH EVERY MORNING AND 2 CAPSULE  EVERY EVENING 90 capsule 0  . Nutritional Supplements (FEEDING SUPPLEMENT, NEPRO CARB STEADY,) LIQD Take 237 mLs by mouth as needed (missed meal during dialysis.). 30 Can 0  . nystatin-triamcinolone (MYCOLOG II) cream Apply 1 application topically every 8 (eight) hours as needed (rash).     . Omega-3 Fatty Acids (OMEGA 3 PO) Take 1,000 mg by mouth daily.    [name] omeprazole (PRILOSEC) 40 MG capsule Take 40 mg by mouth every morning.     [name] PARoxetine (PAXIL) 20 MG tablet Take 20 mg by mouth every morning.     . rosuvastatin (CRESTOR) 20 MG tablet Take 1 tablet (20 mg total) by mouth at bedtime. 30 tablet 5  . sevelamer carbonate (RENVELA) 800 MG tablet Take 800 mg by mouth 3 (three) times daily with meals.    [name] acetaminophen (TYLENOL) 500 MG tablet Take 1,000 mg by mouth every 6 (six) hours as needed (pain).    . permethrin (ELIMITE) 5 % cream APPLY TOPICALLY ONCE AS DIRECTED (Patient not taking: Reported on 02/02/2015) 60 g 0    Home: Home Living Family/patient expects to be discharged to:: Inpatient rehab Living Arrangements: Parent Available Help at Discharge: Available 24 hours/day Type of Home: House Home Access: Stairs to enter [employer] of Steps: 1 Entrance Stairs-Rails: None Home Layout: One level  Functional History: Prior Function Level of Independence: Independent with assistive device(s) Comments: Pt states he either takes a shower or a bath at home and "most of the time" does not have any trouble standing up from the  tub. Functional Status:  Mobility: Bed Mobility Overal bed mobility: Needs Assistance Bed Mobility: Supine to Sit Supine to sit: Mod assist, HOB elevated General bed mobility comments: Pt was able to transition to EOB with use of bed rail for support and bed pad to complete scooting. VC's for sequencing and technique.  Transfers Overall transfer level: Needs assistance Equipment used: 2 person hand held assist Transfers: Sit to/from Stand Sit to Stand: Mod assist General transfer comment: Pt required +1 assist to stand at EOB, and +2 assist for improved posture and balance. Pt initially bracing legs against bed for support.  Ambulation/Gait Ambulation/Gait assistance: Mod assist, +2 physical assistance Ambulation Distance (Feet): 5 Feet Assistive device: 2 person hand held assist Gait Pattern/deviations: Step-to pattern, Decreased stride length, Shuffle, Trunk flexed Gait velocity: Decreased Gait velocity interpretation: Below normal speed for age/gender General Gait Details: Pt with heavy support through UE's on therapist. Pt with minimal floor clearance and often just slid his feet forward. Noted decreased DF bilaterally. Was able to take one step forward with step-through technique when specifically cued to do so.     ADL: ADL Overall ADL's : Needs assistance/impaired Eating/Feeding: Set up, Supervision/ safety, Bed level (*) Grooming: Supervision/safety, Set up, Sitting (*) Upper Body Bathing: Set up, Supervision/ safety (*) Lower Body Bathing: Moderate assistance (*; with Mod A+2 for standing up tall) Upper Body Dressing : Minimal assistance, Sitting (*) Lower Body Dressing: Maximal assistance (*; with Mod A+2 for standing up tall) Toilet Transfer: Moderate assistance, Stand-pivot (bed>recliner) Toileting- Clothing Manipulation and Hygiene: Total assistance (with mod A +2 for standing up tall)  Cognition: Cognition Overall Cognitive Status: No family/caregiver present to  determine baseline cognitive functioning (Slow to respond and decreased attention at times) Orientation Level: Oriented X4 Cognition Arousal/Alertness: Awake/alert Behavior During Therapy: WFL for tasks assessed/performed Overall Cognitive Status: No family/caregiver present to determine baseline cognitive functioning (Slow to respond and decreased attention at times)  Blood pressure 141/84, pulse 78, temperature 96.8 F (36 C), temperature source Oral, resp. rate 22, height 5\' 8"  (1.727 m), weight 76.3 kg (168 lb 3.4 oz), SpO2 100 %. Physical Exam  Constitutional: He appears well-developed.  HENT:  Head: Atraumatic.  Eyes: Pupils are equal, round, and reactive to light.  Neck: Normal range of motion.  Cardiovascular: Normal rate.   Respiratory: Effort normal.  Musculoskeletal: Normal range of motion.  Neurological:  Oriented to place, name. Initiates little. Poor processing and awareness. Strength 4/5 UE's. LE: 2/5 hf, 3/5 ke and 3/5 ankles. Poor trunk control. Required max s person assist for transfer from chair to commode  Psychiatric:  Cooperative, confused, delayed    Results for orders placed or performed during the hospital encounter of 02/02/15 (from the past 24 hour(s))  Glucose, capillary     Status: Abnormal   Collection Time: 02/07/15  4:34 PM  Result Value Ref Range   Glucose-Capillary 277 (H) 70 - 99 mg/dL  Glucose, capillary     Status: Abnormal   Collection Time: 02/07/15  9:10 PM  Result Value Ref Range   Glucose-Capillary 198 (H) 70 - 99 mg/dL  CBC     Status: Abnormal   Collection Time: 02/08/15  8:29 AM  Result Value Ref Range   WBC 7.7 4.0 - 10.5 K/uL   RBC 3.62 (L) 4.22 - 5.81 MIL/uL   Hemoglobin 10.4 (L) 13.0 - 17.0 g/dL   HCT 94.3 (L) 27.6 - 14.7 %   MCV 89.0 78.0 - 100.0 fL   MCH 28.7 26.0 - 34.0 pg   MCHC 32.3 30.0 - 36.0 g/dL   RDW 09.2 (H) 95.7 - 47.3 %   Platelets 121 (L) 150 - 400 K/uL  Renal function panel     Status: Abnormal   Collection  Time: 02/08/15  8:29 AM  Result Value Ref Range   Sodium 127 (L) 135 - 145 mmol/L   Potassium 4.7 3.5 - 5.1 mmol/L   Chloride 91 (L) 96 - 112 mmol/L   CO2 22 19 - 32 mmol/L   Glucose, Bld 319 (H) 70 - 99 mg/dL   BUN 34 (H) 6 - 23 mg/dL   Creatinine, Ser 4.03 (H) 0.50 - 1.35 mg/dL   Calcium 8.8 8.4 - 70.9 mg/dL   Phosphorus 4.0 2.3 - 4.6 mg/dL   Albumin 2.2 (L) 3.5 - 5.2 g/dL   GFR calc non Af Amer 13 (L) >90 mL/min   GFR calc Af Amer 15 (L) >90 mL/min   Anion gap 14 5 - 15   No results found.  Assessment/Plan: Diagnosis: metabolic encephalopathy 1. Does the need for close, 24 hr/day medical supervision in concert with the patient's rehab needs make it unreasonable for this patient to be served in a less intensive setting? Yes 2. Co-Morbidities requiring supervision/potential complications: ERSD, HTN, CAD 3. Due to bladder management, bowel management, safety, skin/wound care, disease management, medication administration, pain management and patient education, does the patient require 24 hr/day rehab nursing? Yes 4. Does the patient require coordinated care of a physician, rehab nurse, PT (1-2 hrs/day, 5 days/week), OT (1-2 hrs/day, 5 days/week) and SLP (1-2 hrs/day, 5 days/week) to address physical and functional deficits in the context of the above medical diagnosis(es)? Potentially Addressing deficits in the following areas: balance, endurance, locomotion, strength, transferring, bowel/bladder control, bathing, dressing, feeding, grooming, toileting, cognition and psychosocial support 5. Can the patient actively participate in an intensive therapy program of at least 3 hrs of therapy  per day at least 5 days per week? Potentially 6. The potential for patient to make measurable gains while on inpatient rehab is good 7. Anticipated functional outcomes upon discharge from inpatient rehab are supervision  with PT, supervision and min assist with OT, supervision and min assist with  SLP. 8. Estimated rehab length of stay to reach the above functional goals is: 17-22 days 9. Does the patient have adequate social supports and living environment to accommodate these discharge functional goals? Potentially 10. Anticipated D/C setting: Home 11. Anticipated post D/C treatments: HH therapy 12. Overall Rehab/Functional Prognosis: good  RECOMMENDATIONS: This patient's condition is appropriate for continued rehabilitative care in the following setting: CIR vs SNF Patient has agreed to participate in recommended program. Potentially Note that insurance prior authorization may be required for reimbursement for recommended care.  Comment: Pt states he wants to go to a rehab place closer to home in [name]. If supports are in place at home, I would be willing to consider CIR, however. Rehab Admissions Coordinator to follow up.  Thanks,  Ranelle Oyster, MD, [place] [facility]     02/08/2015

## 2015-02-08 NOTE — Procedures (Signed)
I have personally attended this patient's dialysis session.   UF goal 2 liters AVF BFR 400 No labs yet More awake and tolerating treatment well  Camille Bal, MD [facility] [phone] Pager 02/08/2015, 8:38 AM

## 2015-02-08 NOTE — Progress Notes (Signed)
Results for Taylor [name], Taylor [name] (MRN [id number]) as of 02/08/2015 21:06  Ref. Range 02/08/2015 19:59  Glucose Latest Range: 70-99 mg/dL 798 (HH)  Will update MD on call.

## 2015-02-09 LAB — CBC
HCT: 32.3 % — ABNORMAL LOW (ref 39.0–52.0)
HEMOGLOBIN: 10.5 g/dL — AB (ref 13.0–17.0)
MCH: 28.5 pg (ref 26.0–34.0)
MCHC: 32.5 g/dL (ref 30.0–36.0)
MCV: 87.8 fL (ref 78.0–100.0)
Platelets: 123 10*3/uL — ABNORMAL LOW (ref 150–400)
RBC: 3.68 MIL/uL — AB (ref 4.22–5.81)
RDW: 15.9 % — ABNORMAL HIGH (ref 11.5–15.5)
WBC: 7.7 10*3/uL (ref 4.0–10.5)

## 2015-02-09 LAB — RENAL FUNCTION PANEL
ALBUMIN: 2.3 g/dL — AB (ref 3.5–5.2)
Anion gap: 13 (ref 5–15)
BUN: 27 mg/dL — ABNORMAL HIGH (ref 6–23)
CHLORIDE: 88 mmol/L — AB (ref 96–112)
CO2: 24 mmol/L (ref 19–32)
CREATININE: 3.65 mg/dL — AB (ref 0.50–1.35)
Calcium: 8.7 mg/dL (ref 8.4–10.5)
GFR calc Af Amer: 21 mL/min — ABNORMAL LOW (ref >90)
GFR calc non Af Amer: 19 mL/min — ABNORMAL LOW (ref >90)
Glucose, Bld: 225 mg/dL — ABNORMAL HIGH (ref 70–99)
POTASSIUM: 4.7 mmol/L (ref 3.5–5.1)
Phosphorus: 3.4 mg/dL (ref 2.3–4.6)
Sodium: 125 mmol/L — ABNORMAL LOW (ref 135–145)

## 2015-02-09 LAB — GLUCOSE, CAPILLARY
Glucose-Capillary: 147 mg/dL — ABNORMAL HIGH (ref 70–99)
Glucose-Capillary: 319 mg/dL — ABNORMAL HIGH (ref 70–99)
Glucose-Capillary: 345 mg/dL — ABNORMAL HIGH (ref 70–99)

## 2015-02-09 MED ORDER — LORAZEPAM 1 MG PO TABS
1.0000 mg | ORAL_TABLET | Freq: Once | ORAL | Status: AC
  Administered 2015-02-09: 1 mg via ORAL
  Filled 2015-02-09: qty 1

## 2015-02-09 MED ORDER — CALCIUM CARBONATE ANTACID 500 MG PO CHEW
CHEWABLE_TABLET | ORAL | Status: AC
  Filled 2015-02-09: qty 2

## 2015-02-09 MED ORDER — CALCIUM CARBONATE ANTACID 500 MG PO CHEW
2.0000 | CHEWABLE_TABLET | Freq: Once | ORAL | Status: AC
  Administered 2015-02-09: 400 mg via ORAL

## 2015-02-09 MED ORDER — INSULIN GLARGINE 100 UNIT/ML ~~LOC~~ SOLN
12.0000 [IU] | Freq: Two times a day (BID) | SUBCUTANEOUS | Status: DC
  Administered 2015-02-09 – 2015-02-10 (×2): 12 [IU] via SUBCUTANEOUS
  Filled 2015-02-09 (×4): qty 0.12

## 2015-02-09 MED ORDER — CALCIUM CARBONATE ANTACID 500 MG PO CHEW
1.0000 | CHEWABLE_TABLET | Freq: Two times a day (BID) | ORAL | Status: DC | PRN
  Administered 2015-02-09 – 2015-02-11 (×3): 200 mg via ORAL
  Filled 2015-02-09 (×3): qty 1

## 2015-02-09 NOTE — Progress Notes (Signed)
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  Assessment/Plan: Penile scrotal abscess with sepsis: - Status post I&D on 02/03/2015 cultures revealed Escherichia coli sensitive to Rocephin and cefazolin. Change to keflex. - Lactic acidosis improved with IV hydration. - Appreciate urology's assistance. - PT rec SNF.  Acute encephalopathy/acute confusional state: - Ativan and Haldol were discontinued, awake and oriented. - CT of the head was unremarkable. Neurology was consulted. - EEG showed slowing to form activity.  Severe DKA: - Initially treated with IV insulin. Her acidosis resolved this was most likely triggered due to his abscess.  - His last hemoglobin A1c was 15. - increase lantus cont SSI. - Poorly controlled at home will need better management.  Hypothyroidism: - Continue Synthroid.  End stage renal disease due to diabetes: - Nephrology following.  - Further management per nephrology.  Accelerated hypertension: - He is back on his clonidine and metoprolol and his blood pressure slowly improved.  DVT Prophylaxis: Heparin  Code Status: Full code  Family Communication: No family at bedside currently. Discussed with his father today over phone. Disposition Plan: Will remain in stepdown.  Consultants:  Renal  Urology  neurology  Procedures:  HD  I&D of penoscrotal abscess 3.27.2016  Antibiotics:  rocephin (indicate start date, and stop date if known)  HPI/Subjective: Feels better close to baseline  Objective: Filed Vitals:   02/09/15 0925 02/09/15 0955 02/09/15 1025 02/09/15 1030  BP: 92/44 114/60 94/50 122/67  Pulse: 69 71 69 69  Temp:    97.5 F (36.4 C)    TempSrc:    Oral  Resp: 28 16 14 16   Height:      Weight:    80.4 kg (177 lb 4 oz)  SpO2:    100%    Intake/Output Summary (Last 24 hours) at 02/09/15 1137 Last data filed at 02/09/15 1030  Gross per 24 hour  Intake   1610 ml  Output   2125 ml  Net   -515 ml   Filed Weights   02/08/15 1136 02/09/15 0730 02/09/15 1030  Weight: 76.3 kg (168 lb 3.4 oz) 82.4 kg (181 lb 10.5 oz) 80.4 kg (177 lb 4 oz)    Exam:  General: Alert, awake, oriented x3, in no acute distress.  HEENT: No bruits, no goiter.  Heart: Regular rate and rhythm. Lungs: Good air movement, clear  Abdomen: Soft, nontender, nondistended, positive bowel sounds.    Data Reviewed: Basic Metabolic Panel:  Recent Labs Lab 02/05/15 0307 02/06/15 0722 02/07/15 0825 02/08/15 0829 02/08/15 1959 02/09/15 0744  NA 137 130* 129* 127*  --  125*  K 3.8 4.2 4.1 4.7  --  4.7  CL 100 91* 94* 91*  --  88*  CO2 24 22 23 22   --  24  GLUCOSE 97 70 342* 319* 615* 225*  BUN 14 24* 20 34*  --  27*  CREATININE 3.58* 4.68* 4.00* 4.88*  --  3.65*  CALCIUM 9.2 9.5 8.8 8.8  --  8.7  PHOS 4.0 4.7* 3.3 4.0  --  3.4   Liver Function Tests:  Recent Labs Lab 02/04/15 1921 02/05/15 0307 02/06/15 0722 02/07/15 0825 02/08/15 0829 02/09/15 0744  AST 48*  --   --   --   --   --  ALT 17  --   --   --   --   --   ALKPHOS 241*  --   --   --   --   --   BILITOT 1.5*  --   --   --   --   --   PROT 7.0  --   --   --   --   --   ALBUMIN 2.8* 2.5* 2.5* 2.2* 2.2* 2.3*   No results for input(s): LIPASE, AMYLASE in the last 168 hours. No results for input(s): AMMONIA in the last 168 hours. CBC:  Recent Labs Lab 02/05/15 0307 02/06/15 0721 02/07/15 0825 02/08/15 0829 02/09/15 0744  WBC 7.3 11.6* 6.8 7.7 7.7  HGB 11.3* 11.0* 10.7* 10.4* 10.5*  HCT 35.5* 33.8* 32.6* 32.2* 32.3*  MCV 90.8 86.9 88.6 89.0 87.8  PLT 121* 177 117* 121* 123*   Cardiac Enzymes: No results for input(s): CKTOTAL, CKMB, CKMBINDEX, TROPONINI in the  last 168 hours. BNP (last 3 results) No results for input(s): BNP in the last 8760 hours.  ProBNP (last 3 results)  Recent Labs  03/26/14 1555  PROBNP 1212.0*    CBG:  Recent Labs Lab 02/07/15 1634 02/07/15 2110 02/08/15 1222 02/08/15 1654 02/08/15 2210  GLUCAP 277* 198* 170* 492* 521*    Recent Results (from the past 240 hour(s))  Urine culture     Status: None   Collection Time: 02/02/15  3:57 AM  Result Value Ref Range Status   Specimen Description URINE, RANDOM  Final   Special Requests NONE  Final   Colony Count   Final    >=100,000 COLONIES/ML Performed at [facility]    Culture   Final    Multiple bacterial morphotypes present, none predominant. Suggest appropriate recollection if clinically indicated. Performed at [facility]    Report Status 02/04/2015 FINAL  Final  Stat Gram stain     Status: None   Collection Time: 02/02/15 10:50 AM  Result Value Ref Range Status   Specimen Description PENILE DRAINAGE  Final   Special Requests Immunocompromised  Final   Gram Stain   Final    ABUNDANT WBC PRESENT,BOTH PMN AND MONONUCLEAR ABUNDANT GRAM NEGATIVE COCCI IN DIPLOS Gram Stain Report Called to,Read Back By and Verified With: A THOMPSON,RN 02/02/15 1207 M [facility]    Report Status 02/02/2015 FINAL  Final  Wound culture     Status: None   Collection Time: 02/02/15 10:50 AM  Result Value Ref Range Status   Specimen Description PENILE DRAINAGE  Final   Special Requests Immunocompromised  Final   Gram Stain   Final    ABUNDANT WBC PRESENT,BOTH PMN AND MONONUCLEAR ABUNDANT GRAM NEGATIVE COCCI Gram Stain Report Called to,Read Back By and Verified With: Gram Stain Report Called to,Read Back By and Verified With: A THOMPSON RN 02/02/15 1207 Judie Petit [facility] Performed at [facility]    Culture   Final    MULTIPLE ORGANISMS PRESENT, NONE PREDOMINANT Note: NO STAPHYLOCOCCUS AUREUS ISOLATED NO GROUP A STREP (S.PYOGENES) ISOLATED Performed at  [facility]    Report Status 02/05/2015 FINAL  Final  MRSA PCR Screening     Status: None   Collection Time: 02/02/15  5:13 PM  Result Value Ref Range Status   MRSA by PCR NEGATIVE NEGATIVE Final    Comment:        The GeneXpert MRSA Assay (FDA approved for NASAL specimens only), is one component of a comprehensive MRSA colonization  surveillance program. It is not intended to diagnose MRSA infection nor to guide or monitor treatment for MRSA infections.   Surgical pcr screen     Status: Abnormal   Collection Time: 02/03/15  6:27 AM  Result Value Ref Range Status   MRSA, PCR NEGATIVE NEGATIVE Final   Staphylococcus aureus POSITIVE (A) NEGATIVE Final    Comment:        The Xpert SA Assay (FDA approved for NASAL specimens in patients over 67 years of age), is one component of a comprehensive surveillance program.  Test performance has been validated by [facility] for patients greater than or equal to 62 year old. It is not intended to diagnose infection nor to guide or monitor treatment.   Anaerobic culture     Status: None   Collection Time: 02/03/15  8:29 AM  Result Value Ref Range Status   Specimen Description ABSCESS  Final   Special Requests PENIS  Final   Gram Stain   Final    ABUNDANT WBC PRESENT, PREDOMINANTLY PMN RARE SQUAMOUS EPITHELIAL CELLS PRESENT FEW YEAST ABUNDANT GRAM NEGATIVE RODS Performed at [facility]    Culture   Final    NO ANAEROBES ISOLATED Performed at [facility]    Report Status 02/08/2015 FINAL  Final  Culture, routine-abscess     Status: None   Collection Time: 02/03/15  8:29 AM  Result Value Ref Range Status   Specimen Description ABSCESS  Final   Special Requests PENIS  Final   Gram Stain   Final    MODERATE WBC PRESENT, PREDOMINANTLY PMN RARE SQUAMOUS EPITHELIAL CELLS PRESENT FEW YEAST ABUNDANT GRAM NEGATIVE RODS Performed at [facility]    Culture   Final    MODERATE ESCHERICHIA  COLI Performed at [facility]    Report Status 02/05/2015 FINAL  Final   Organism ID, Bacteria ESCHERICHIA COLI  Final      Susceptibility   Escherichia coli - MIC*    AMPICILLIN >=32 RESISTANT Resistant     AMPICILLIN/SULBACTAM 16 INTERMEDIATE Intermediate     CEFAZOLIN <=4 SENSITIVE Sensitive     CEFEPIME <=1 SENSITIVE Sensitive     CEFTAZIDIME <=1 SENSITIVE Sensitive     CEFTRIAXONE <=1 SENSITIVE Sensitive     CIPROFLOXACIN >=4 RESISTANT Resistant     GENTAMICIN <=1 SENSITIVE Sensitive     IMIPENEM <=0.25 SENSITIVE Sensitive     PIP/TAZO <=4 SENSITIVE Sensitive     TOBRAMYCIN <=1 SENSITIVE Sensitive     TRIMETH/SULFA >=320 RESISTANT Resistant     * MODERATE ESCHERICHIA COLI     Studies: No results found.  Scheduled Meds: . aspirin  81 mg Oral q morning - 10a  . cephALEXin  500 mg Oral 4 times per day  . cloNIDine  0.2 mg Transdermal Weekly  . fluconazole  200 mg Oral QHS  . heparin  5,000 Units Subcutaneous 3 times per day  . insulin aspart  0-5 Units Subcutaneous QHS  . insulin aspart  0-9 Units Subcutaneous TID WC  . insulin aspart  3 Units Subcutaneous TID WC  . insulin glargine  10 Units Subcutaneous BID  . levothyroxine  75 mcg Oral QAC breakfast  . metoprolol tartrate  25 mg Oral BID  . QUEtiapine  25 mg Oral BID  . sodium chloride  3 mL Intravenous Q12H   Continuous Infusions:    Marinda Elk  [facility] Pager [phone]. If 7PM-7AM, please contact night-coverage at [email or web address], password [facility] 02/09/2015, 11:37  AM  LOS: 7 days

## 2015-02-09 NOTE — Progress Notes (Signed)
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 Dialysis at the [facility] unit. Now with recurrent L corporal infection at the penile base.   Pt has normal urethra, and still makes enough urine to void each spontaneously each day. Note MRSA NEG consistently; with wound gram stain showing gram NEG Cocci with culture showing E.coli sensitive to ceftriaxone, which was started 3/29. Also on fluconazole for yeast in gram stain.  Interval: antibiotics de-escalated to keflex. Wet-dry dressing changes BID. WBC normalized. Receiving dialysis per nephrology. No interval changes. Plan for AIR closer to home.  Objective: Vital signs in last 24 hours: Temp:  [97.3 F (36.3 C)-98.3 F (36.8 C)] 97.8 F (36.6 C) (04/02 1618) Pulse Rate:  [69-80] 74 (04/02 1618) Resp:  [14-28] 18 (04/02 1618) BP: (92-142)/(44-98) 123/60 mmHg (04/02 1618) SpO2:  [99 %-100 %] 100 % (04/02 1618) Weight:  [177 lb 4 oz (80.4 kg)-181 lb 10.5 oz (82.4 kg)] 177 lb 4 oz (80.4 kg) (04/02 1030)  Intake/Output from previous day: 04/01 0701 - 04/02 0700 In: 1610 [P.O.:1560; IV Piggyback:50] Out: 3225 [Urine:225] Intake/Output this shift: Total I/O In: 1200 [P.O.:1200] Out: 1901 [Other:1900; Stool:1]  Physical Exam:  General: depressed mental status c/w encephalopathy Penis: firm, indurated shafts bilaterally. Purulence expressed from incision site on right glans. 3 cm tunnel when probed. Packed with iodoform gauze. Drain exiting left shaft skin. Purulent fluid around the foley  catheter that can be expressed. Scrotum: open I&D site at base of left side of penis/left hemiscrotum. Brown fluid draining. No necrotic tissue noted. Soaked dressing was removed and replaced.   Lab Results: CBC Latest Ref Rng 02/09/2015 02/08/2015 02/07/2015  WBC 4.0 - 10.5 K/uL 7.7 7.7 6.8  Hemoglobin 13.0 - 17.0 g/dL 10.5(L) 10.4(L) 10.7(L)  Hematocrit 39.0 - 52.0 % 32.3(L) 32.2(L) 32.6(L)  Platelets 150 - 400 K/uL 123(L) 121(L) 117(L)    BMP Latest Ref Rng 02/09/2015 02/08/2015 02/08/2015  Glucose 70 - 99 mg/dL 528(U) 132(GM) 010(U)  BUN 6 - 23 mg/dL 72(Z) - 36(U)  Creatinine 0.50 - 1.35 mg/dL 4.40(H) - 4.74(Q)  BUN/Creat Ratio 9 - 20 - - -  Sodium 135 - 145 mmol/L 125(L) - 127(L)  Potassium 3.5 - 5.1 mmol/L 4.7 - 4.7  Chloride 96 - 112 mmol/L 88(L) - 91(L)  CO2 19 - 32 mmol/L 24 - 22  Calcium 8.4 - 10.5 mg/dL 8.7 - 8.8    No results for input(s): LABPT, INR in the last 72 hours. No results for input(s): LABURIN in the last 72 hours. Results for orders placed or performed during the hospital encounter of 02/02/15  Urine culture     Status: None   Collection Time: 02/02/15  3:57 AM  Result Value Ref Range Status   Specimen Description URINE, RANDOM  Final   Special Requests NONE  Final   Colony Count   Final    >=100,000 COLONIES/ML Performed at [facility]    Culture   Final    Multiple bacterial morphotypes present, none predominant. Suggest appropriate recollection if clinically indicated. Performed at [facility]    Report Status 02/04/2015 FINAL  Final  Stat Gram stain     Status: None   Collection Time: 02/02/15 10:50 AM  Result Value Ref Range Status   Specimen Description PENILE DRAINAGE  Final   Special Requests Immunocompromised  Final   Gram Stain   Final    ABUNDANT WBC PRESENT,BOTH PMN AND MONONUCLEAR ABUNDANT GRAM NEGATIVE COCCI IN DIPLOS Gram Stain Report Called to,Read Back By and Verified With: A [facility] 02/02/15 1207 M [facility]     Report Status 02/02/2015 FINAL  Final  Wound culture     Status: None   Collection Time: 02/02/15 10:50 AM  Result Value Ref Range Status   Specimen Description PENILE DRAINAGE  Final   Special Requests Immunocompromised  Final   Gram Stain   Final    ABUNDANT WBC PRESENT,BOTH PMN AND MONONUCLEAR ABUNDANT GRAM NEGATIVE COCCI Gram Stain Report Called to,Read Back By and Verified With: Gram Stain Report Called to,Read Back By and Verified With: A THOMPSON RN 02/02/15 1207 Judie Petit [facility] Performed at [facility]    Culture   Final    MULTIPLE ORGANISMS PRESENT, NONE PREDOMINANT Note: NO STAPHYLOCOCCUS AUREUS ISOLATED NO GROUP A STREP (S.PYOGENES) ISOLATED Performed at [facility]    Report Status 02/05/2015 FINAL  Final  MRSA PCR Screening     Status: None   Collection Time: 02/02/15  5:13 PM  Result Value Ref Range Status   MRSA by PCR NEGATIVE NEGATIVE Final    Comment:        The GeneXpert MRSA Assay (FDA approved for NASAL specimens only), is one component of a comprehensive MRSA colonization surveillance program. It is not intended to diagnose MRSA infection nor to guide or monitor treatment for MRSA infections.   Surgical pcr screen     Status: Abnormal   Collection Time: 02/03/15  6:27 AM  Result Value Ref Range Status   MRSA, PCR NEGATIVE NEGATIVE Final   Staphylococcus aureus POSITIVE (A) NEGATIVE Final    Comment:        The Xpert SA Assay (FDA approved for NASAL specimens in patients over 57 years of age), is one component of a comprehensive surveillance program.  Test performance has been validated by [facility] for patients greater than or equal to 47 year old. It is not intended to diagnose infection nor to guide or monitor treatment.   Anaerobic culture     Status: None   Collection Time: 02/03/15  8:29 AM  Result Value Ref Range Status   Specimen Description ABSCESS  Final   Special Requests PENIS  Final   Gram Stain   Final     ABUNDANT WBC PRESENT, PREDOMINANTLY PMN RARE SQUAMOUS EPITHELIAL CELLS PRESENT FEW YEAST ABUNDANT GRAM NEGATIVE RODS Performed at [facility]    Culture   Final    NO ANAEROBES ISOLATED Performed at [facility]    Report Status 02/08/2015 FINAL  Final  Culture, routine-abscess     Status: None   Collection Time: 02/03/15  8:29 AM  Result Value Ref Range Status   Specimen Description ABSCESS  Final   Special Requests PENIS  Final   Gram Stain   Final    MODERATE WBC PRESENT, PREDOMINANTLY PMN RARE SQUAMOUS EPITHELIAL CELLS PRESENT FEW YEAST ABUNDANT GRAM NEGATIVE RODS Performed at [facility]    Culture   Final    MODERATE ESCHERICHIA COLI Performed at [facility]    Report Status 02/05/2015 FINAL  Final   Organism ID, Bacteria ESCHERICHIA COLI  Final      Susceptibility   Escherichia coli - MIC*    AMPICILLIN >=32 RESISTANT Resistant     AMPICILLIN/SULBACTAM 16 INTERMEDIATE Intermediate     CEFAZOLIN <=4 SENSITIVE Sensitive     CEFEPIME <=1 SENSITIVE Sensitive     CEFTAZIDIME <=1 SENSITIVE Sensitive     CEFTRIAXONE <=1 SENSITIVE Sensitive     CIPROFLOXACIN >=4 RESISTANT Resistant     GENTAMICIN <=1 SENSITIVE Sensitive     IMIPENEM <=0.25 SENSITIVE Sensitive     PIP/TAZO <=4 SENSITIVE Sensitive     TOBRAMYCIN <=1 SENSITIVE Sensitive     TRIMETH/SULFA >=320 RESISTANT Resistant     * MODERATE ESCHERICHIA COLI    Studies/Results: No results found.  Assessment/Plan: Improvement with aggressive I/S and cefepime. Now on keflex. Will leave drain and follow wound.   Change wet-to-dry dressings BID at base of penis as well as iodoform gauze into right shaft space BID.    LOS: 7 days   Laural Benes, Daisey Caloca C 02/09/2015, 6:41 PM

## 2015-02-09 NOTE — Progress Notes (Signed)
Pt completed HD with no complications. 2L fluid removed. Alert, vss, report called to primary RN. Pt. Returned safely to room.

## 2015-02-09 NOTE — Progress Notes (Signed)
[facility] Progress Note   Subjective:  Had dialysis yesterday to get back on schedule (Usual MWF patient) 2 liters off yesterday Next HD on Monday  Filed Vitals:   02/09/15 0925 02/09/15 0955 02/09/15 1025 02/09/15 1030  BP: 92/44 114/60 94/50 122/67  Pulse: 69 71 69 69  Temp:    97.5 F (36.4 C)  TempSrc:    Oral  Resp: 28 16 14 16   Height:      Weight:    80.4 kg (177 lb 4 oz)  SpO2:    100%    Physical Exam: BP 122/67 mmHg  Pulse 69  Temp(Src) 97.5 F (36.4 C) (Oral)  Resp 16  Ht 5\' 8"  (1.727 m)  Wt 80.4 kg (177 lb 4 oz)  BMI 26.96 kg/m2  SpO2 100%  No JVD Chest clear bilaterally  S1S2 Widely split S2 No S3 no MRG Left upper arm AVF Abd soft, NTND,  GU Dressing over penis/ scrotum 1+ LE edema pretib bilat with scabs/excoriation over right pre-tib region.  Right TMA, absent L great toe, scabs RLE Neuro is awake, more appropriate   Recent Labs Lab 02/07/15 0825 02/08/15 0829 02/08/15 1959 02/09/15 0744  NA 129* 127*  --  125*  K 4.1 4.7  --  4.7  CL 94* 91*  --  88*  CO2 23 22  --  24  GLUCOSE 342* 319* 615* 225*  BUN 20 34*  --  27*  CREATININE 4.00* 4.88*  --  3.65*  CALCIUM 8.8 8.8  --  8.7  PHOS 3.3 4.0  --  3.4    Recent Labs Lab 02/04/15 1921  02/07/15 0825 02/08/15 0829 02/09/15 0744  AST 48*  --   --   --   --   ALT 17  --   --   --   --   ALKPHOS 241*  --   --   --   --   BILITOT 1.5*  --   --   --   --   PROT 7.0  --   --   --   --   ALBUMIN 2.8*  < > 2.2* 2.2* 2.3*  < > = values in this interval not displayed.  Recent Labs Lab 02/07/15 0825 02/08/15 0829 02/09/15 0744  WBC 6.8 7.7 7.7  HGB 10.7* 10.4* 10.5*  HCT 32.6* 32.2* 32.3*  MCV 88.6 89.0 87.8  PLT 117* 121* 123*     Medications . aspirin  81 mg Oral q morning - 10a  . cephALEXin  500 mg Oral 4 times per day  . cloNIDine  0.2 mg Transdermal Weekly  . fluconazole  200 mg Oral QHS  . heparin  5,000 Units Subcutaneous 3 times per day  .  insulin aspart  0-5 Units Subcutaneous QHS  . insulin aspart  0-9 Units Subcutaneous TID WC  . insulin aspart  3 Units Subcutaneous TID WC  . insulin glargine  12 Units Subcutaneous BID  . levothyroxine  75 mcg Oral QAC breakfast  . metoprolol tartrate  25 mg Oral BID  . sodium chloride  3 mL Intravenous Q12H   HD: TTS [facility] 3h 04/10/15  80kg   Heparin none   L arm AVF Will have lower EDW at discharge  02/04/15 Weight 77.9 pre/75.6 post 02/06/15 Weight  76.5 pre/74.3 post 02/08/15  Weight 78.6 pre/76.3 post   Assessment/Recommendations  1. Penile/ scrotal abcess (recurrent)  - Hx penile abscess complicating penile implant placement 07/2014;  implant removed Oct '15 d/t infection -  s/p I&D in the OR.  Culture + EColi. On keflex/fluconazole/dressing changes  2. 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 ESRD on HD - normal schedule TTS.  Off schedule. Will get back onto correct TTS schedule with HD again tomorrow for 3 hours only.   He will have a lower EDW at discharge.  Edema is looking better. 4. HTN on catapres patch TTS1 and metoprolol  5. Volume -  +vol excess w LE edema - better as we lower EDW 6. DKA / DM  - back on SQ insulin 7. PAD hx toe amps and TMA 8. CAD prior CABG 9. Hypothyroidism on replacement 10. AMS - encephalopathy improving  Camille Bal, MD [facility] [phone] Pager 02/08/2015, 8:23 AM

## 2015-02-10 DIAGNOSIS — E1310 Other specified diabetes mellitus with ketoacidosis without coma: Secondary | ICD-10-CM

## 2015-02-10 LAB — GLUCOSE, CAPILLARY
GLUCOSE-CAPILLARY: 344 mg/dL — AB (ref 70–99)
Glucose-Capillary: 251 mg/dL — ABNORMAL HIGH (ref 70–99)
Glucose-Capillary: 151 mg/dL — ABNORMAL HIGH (ref 70–99)
Glucose-Capillary: 122 mg/dL — ABNORMAL HIGH (ref 70–99)

## 2015-02-10 MED ORDER — HYDROXYZINE HCL 25 MG PO TABS: 25.0000 mg | ORAL_TABLET | Freq: Four times a day (QID) | ORAL | Status: DC | PRN

## 2015-02-10 MED ORDER — AMITRIPTYLINE HCL 25 MG PO TABS
25.0000 mg | ORAL_TABLET | Freq: Every day | ORAL | Status: DC
  Administered 2015-02-10: 25 mg via ORAL
  Filled 2015-02-10 (×2): qty 1

## 2015-02-10 MED ORDER — LORAZEPAM 1 MG PO TABS
1.0000 mg | ORAL_TABLET | Freq: Once | ORAL | Status: AC
  Administered 2015-02-10: 1 mg via ORAL
  Filled 2015-02-10: qty 1

## 2015-02-10 MED ORDER — ROSUVASTATIN CALCIUM 20 MG PO TABS
20.0000 mg | ORAL_TABLET | Freq: Every day | ORAL | Status: DC
  Administered 2015-02-10: 20 mg via ORAL
  Filled 2015-02-10 (×2): qty 1

## 2015-02-10 MED ORDER — CARVEDILOL 12.5 MG PO TABS: 12.5000 mg | ORAL_TABLET | Freq: Two times a day (BID) | ORAL | Status: DC

## 2015-02-10 MED ORDER — INSULIN GLARGINE 100 UNIT/ML ~~LOC~~ SOLN
17.0000 [IU] | Freq: Two times a day (BID) | SUBCUTANEOUS | Status: DC
  Administered 2015-02-10 – 2015-02-11 (×2): 17 [IU] via SUBCUTANEOUS
  Filled 2015-02-10 (×4): qty 0.17

## 2015-02-10 MED ORDER — ISOSORBIDE MONONITRATE ER 30 MG PO TB24
30.0000 mg | ORAL_TABLET | Freq: Two times a day (BID) | ORAL | Status: DC
  Administered 2015-02-10 – 2015-02-11 (×3): 30 mg via ORAL
  Filled 2015-02-10 (×4): qty 1

## 2015-02-10 NOTE — Clinical Social Work Placement (Addendum)
Clinical Social Work Department CLINICAL SOCIAL WORK PLACEMENT NOTE 02/10/2015  Patient:  Taylor [name], Taylor [name]  Account Number:  [id number] Admit date:  02/02/2015  Clinical Social Worker:  Aggie Cosier Patrick-jefferson, LCSWA  Date/time:  02/10/2015 03:45 PM  Clinical Social Work is seeking post-discharge placement for this patient at the following level of care:   SKILLED NURSING   (*CSW will update this form in Epic as items are completed)   02/10/2015  Patient/family provided with [facility] Department of Clinical Social Work's list of facilities offering this level of care within the geographic area requested by the patient (or if unable, by the patient's family).  02/10/2015  Patient/family informed of their freedom to choose among providers that offer the needed level of care, that participate in Medicare, Medicaid or managed care program needed by the patient, have an available bed and are willing to accept the patient.  02/10/2015  Patient/family informed of [facility]' ownership interest in [facility], as well as of the fact that they are under no obligation to receive care at this facility.  PASARR submitted to EDS on Existing PASARR number received on Existing  FL2 transmitted to all facilities in geographic area requested by pt/family on  02/10/2015 FL2 transmitted to all facilities within larger geographic area on   Patient informed that his/her managed care company has contracts with or will negotiate with  certain facilities, including the following:     Patient/family informed of bed offers received: 02/11/15  Patient chooses bed at [facility] skilled facility Physician recommends and patient chooses bed at    Patient to be transferred to [facility] on 02/11/15   Patient to be transferred to facility by ambulance Patient and family notified of transfer on 02/11/15 Name of family member notified: Patient father, Taylor [name] at the bedside   The  following physician request were entered in Epic:   Additional Comments:  Forensic psychologist, LCSWA Weekend Clinical Social Worker [phone]

## 2015-02-10 NOTE — Clinical Social Work Note (Signed)
CSW attempted to meet with patient to complete, however, patient asleep and not arousable. CSW to follow tomorrow.  Adalia Pettis Patrick-Jefferson, LCSWA Weekend Clinical Social Worker [phone]

## 2015-02-10 NOTE — Progress Notes (Signed)
Subjective: Patient awake and alert.  Complains about itching on back.  Feels he is back to baseline.  Objective: Current vital signs: BP 143/96 mmHg  Pulse 89  Temp(Src) 97.8 F (36.6 C) (Oral)  Resp 18  Ht 5\' 8"  (1.727 m)  Wt 80.4 kg (177 lb 4 oz)  BMI 26.96 kg/m2  SpO2 100% Vital signs in last 24 hours: Temp:  [97.5 F (36.4 C)-98 F (36.7 C)] 97.8 F (36.6 C) (04/03 0537) Pulse Rate:  [69-89] 89 (04/03 0537) Resp:  [14-18] 18 (04/03 0537) BP: (94-148)/(50-96) 143/96 mmHg (04/03 0537) SpO2:  [100 %] 100 % (04/03 0537) Weight:  [80.4 kg (177 lb 4 oz)] 80.4 kg (177 lb 4 oz) (04/02 1030)  Intake/Output from previous day: 04/02 0701 - 04/03 0700 In: 1200 [P.O.:1200] Out: 2201 [Urine:300; Stool:1] Intake/Output this shift: Total I/O In: 360 [P.O.:360] Out: -  Nutritional status: DIET DYS 3 Room service appropriate?: Yes; Fluid consistency:: Thin  Neurologic Exam: Mental Status: Alert, oriented. Speech fluent without evidence of aphasia. Follows 3-step commands without difficulty. Cranial Nerves: II: Visual fields grossly normal, pupils equal, round, reactive to light and accommodation III,IV, VI: ptosis not present, extra-ocular motions intact bilaterally V,VII: smile symmetric, facial light touch sensation normal bilaterally VIII: hearing normal bilaterally IX,X: uvula rises symmetrically XI: bilateral shoulder shrug XII: midline tongue extension without atrophy or fasciculations  Motor: 5/5 with right hand claw Sensory: Pinprick and light touch intact throughout, bilaterally Deep Tendon Reflexes:  2+ in the upper extremities and absent in the lower extremities.     Lab Results: Basic Metabolic Panel:  Recent Labs Lab 02/05/15 0307 02/06/15 0722 02/07/15 0825 02/08/15 0829 02/08/15 1959 02/09/15 0744  NA 137 130* 129* 127*  --  125*  K 3.8 4.2 4.1 4.7  --  4.7  CL 100 91* 94* 91*  --  88*  CO2 24 22 23 22   --  24  GLUCOSE 97 70 342* 319* 615*  225*  BUN 14 24* 20 34*  --  27*  CREATININE 3.58* 4.68* 4.00* 4.88*  --  3.65*  CALCIUM 9.2 9.5 8.8 8.8  --  8.7  PHOS 4.0 4.7* 3.3 4.0  --  3.4    Liver Function Tests:  Recent Labs Lab 02/04/15 1921 02/05/15 0307 02/06/15 0722 02/07/15 0825 02/08/15 0829 02/09/15 0744  AST 48*  --   --   --   --   --   ALT 17  --   --   --   --   --   ALKPHOS 241*  --   --   --   --   --   BILITOT 1.5*  --   --   --   --   --   PROT 7.0  --   --   --   --   --   ALBUMIN 2.8* 2.5* 2.5* 2.2* 2.2* 2.3*   No results for input(s): LIPASE, AMYLASE in the last 168 hours. No results for input(s): AMMONIA in the last 168 hours.  CBC:  Recent Labs Lab 02/05/15 0307 02/06/15 0721 02/07/15 0825 02/08/15 0829 02/09/15 0744  WBC 7.3 11.6* 6.8 7.7 7.7  HGB 11.3* 11.0* 10.7* 10.4* 10.5*  HCT 35.5* 33.8* 32.6* 32.2* 32.3*  MCV 90.8 86.9 88.6 89.0 87.8  PLT 121* 177 117* 121* 123*    Cardiac Enzymes: No results for input(s): CKTOTAL, CKMB, CKMBINDEX, TROPONINI in the last 168 hours.  Lipid Panel: No results for input(s): CHOL, TRIG,  HDL, CHOLHDL, VLDL, LDLCALC in the last 168 hours.  CBG:  Recent Labs Lab 02/08/15 2210 02/09/15 1123 02/09/15 1617 02/09/15 2109 02/10/15 0752  GLUCAP 521* 147* 319* 345* 251*    Microbiology: Results for orders placed or performed during the hospital encounter of 02/02/15  Urine culture     Status: None   Collection Time: 02/02/15  3:57 AM  Result Value Ref Range Status   Specimen Description URINE, RANDOM  Final   Special Requests NONE  Final   Colony Count   Final    >=100,000 COLONIES/ML Performed at [facility]    Culture   Final    Multiple bacterial morphotypes present, none predominant. Suggest appropriate recollection if clinically indicated. Performed at [facility]    Report Status 02/04/2015 FINAL  Final  Stat Gram stain     Status: None   Collection Time: 02/02/15 10:50 AM  Result Value Ref Range Status    Specimen Description PENILE DRAINAGE  Final   Special Requests Immunocompromised  Final   Gram Stain   Final    ABUNDANT WBC PRESENT,BOTH PMN AND MONONUCLEAR ABUNDANT GRAM NEGATIVE COCCI IN DIPLOS Gram Stain Report Called to,Read Back By and Verified With: A THOMPSON,RN 02/02/15 1207 M [facility]    Report Status 02/02/2015 FINAL  Final  Wound culture     Status: None   Collection Time: 02/02/15 10:50 AM  Result Value Ref Range Status   Specimen Description PENILE DRAINAGE  Final   Special Requests Immunocompromised  Final   Gram Stain   Final    ABUNDANT WBC PRESENT,BOTH PMN AND MONONUCLEAR ABUNDANT GRAM NEGATIVE COCCI Gram Stain Report Called to,Read Back By and Verified With: Gram Stain Report Called to,Read Back By and Verified With: A THOMPSON RN 02/02/15 1207 Judie Petit [facility] Performed at [facility]    Culture   Final    MULTIPLE ORGANISMS PRESENT, NONE PREDOMINANT Note: NO STAPHYLOCOCCUS AUREUS ISOLATED NO GROUP A STREP (S.PYOGENES) ISOLATED Performed at [facility]    Report Status 02/05/2015 FINAL  Final  MRSA PCR Screening     Status: None   Collection Time: 02/02/15  5:13 PM  Result Value Ref Range Status   MRSA by PCR NEGATIVE NEGATIVE Final    Comment:        The GeneXpert MRSA Assay (FDA approved for NASAL specimens only), is one component of a comprehensive MRSA colonization surveillance program. It is not intended to diagnose MRSA infection nor to guide or monitor treatment for MRSA infections.   Surgical pcr screen     Status: Abnormal   Collection Time: 02/03/15  6:27 AM  Result Value Ref Range Status   MRSA, PCR NEGATIVE NEGATIVE Final   Staphylococcus aureus POSITIVE (A) NEGATIVE Final    Comment:        The Xpert SA Assay (FDA approved for NASAL specimens in patients over 59 years of age), is one component of a comprehensive surveillance program.  Test performance has been validated by [facility] for patients greater than or equal  to 68 year old. It is not intended to diagnose infection nor to guide or monitor treatment.   Anaerobic culture     Status: None   Collection Time: 02/03/15  8:29 AM  Result Value Ref Range Status   Specimen Description ABSCESS  Final   Special Requests PENIS  Final   Gram Stain   Final    ABUNDANT WBC PRESENT, PREDOMINANTLY PMN RARE SQUAMOUS EPITHELIAL CELLS PRESENT  FEW YEAST ABUNDANT GRAM NEGATIVE RODS Performed at [facility]    Culture   Final    NO ANAEROBES ISOLATED Performed at [facility]    Report Status 02/08/2015 FINAL  Final  Culture, routine-abscess     Status: None   Collection Time: 02/03/15  8:29 AM  Result Value Ref Range Status   Specimen Description ABSCESS  Final   Special Requests PENIS  Final   Gram Stain   Final    MODERATE WBC PRESENT, PREDOMINANTLY PMN RARE SQUAMOUS EPITHELIAL CELLS PRESENT FEW YEAST ABUNDANT GRAM NEGATIVE RODS Performed at [facility]    Culture   Final    MODERATE ESCHERICHIA COLI Performed at [facility]    Report Status 02/05/2015 FINAL  Final   Organism ID, Bacteria ESCHERICHIA COLI  Final      Susceptibility   Escherichia coli - MIC*    AMPICILLIN >=32 RESISTANT Resistant     AMPICILLIN/SULBACTAM 16 INTERMEDIATE Intermediate     CEFAZOLIN <=4 SENSITIVE Sensitive     CEFEPIME <=1 SENSITIVE Sensitive     CEFTAZIDIME <=1 SENSITIVE Sensitive     CEFTRIAXONE <=1 SENSITIVE Sensitive     CIPROFLOXACIN >=4 RESISTANT Resistant     GENTAMICIN <=1 SENSITIVE Sensitive     IMIPENEM <=0.25 SENSITIVE Sensitive     PIP/TAZO <=4 SENSITIVE Sensitive     TOBRAMYCIN <=1 SENSITIVE Sensitive     TRIMETH/SULFA >=320 RESISTANT Resistant     * MODERATE ESCHERICHIA COLI    Coagulation Studies: No results for input(s): LABPROT, INR in the last 72 hours.  Imaging: No results found.  Medications:  I have reviewed the patient's current medications. Scheduled: . aspirin  81 mg Oral q morning - 10a   . cephALEXin  500 mg Oral 4 times per day  . cloNIDine  0.2 mg Transdermal Weekly  . fluconazole  200 mg Oral QHS  . heparin  5,000 Units Subcutaneous 3 times per day  . insulin aspart  0-5 Units Subcutaneous QHS  . insulin aspart  0-9 Units Subcutaneous TID WC  . insulin aspart  3 Units Subcutaneous TID WC  . insulin glargine  12 Units Subcutaneous BID  . levothyroxine  75 mcg Oral QAC breakfast  . metoprolol tartrate  25 mg Oral BID  . sodium chloride  3 mL Intravenous Q12H    Assessment/Plan: Patient much improved.  Feels that he is back to baseline.    Recommendations: 1.  No further neurologic intervention is recommended at this time.  If further questions arise, please call or page at that time.  Thank you for allowing neurology to participate in the care of this patient.    LOS: 8 days   Thana Farr, MD [facility] [phone] 02/10/2015  9:30 AM

## 2015-02-10 NOTE — Progress Notes (Signed)
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  Assessment/Plan: Penile scrotal abscess with sepsis: - Status post I&D on 02/03/2015 cultures revealed Escherichia coli on keflex and diflucan.  - Sepsis resolved with IV hydration. - Appreciate urology's assistance. - PT rec SNF. - awaiting placement.  Acute encephalopathy/acute confusional state: - likely due to infectious etiology - Ativan and Haldol were discontinued, awake and oriented. - CT of the head was unremarkable. Neurology was consulted. - EEG showed slowing to form activity.  Severe DKA: - Initially treated with IV insulin. Her acidosis resolved this was most likely triggered due to his abscess.  - His last hemoglobin A1c was 15. - Increase Lantuss again cont SSI. - Poorly controlled at home will need better management.  Hypothyroidism: - Continue Synthroid.  End stage renal disease due to diabetes: - Nephrology following. On HD. - Further management per nephrology.  Accelerated hypertension: - He is back on his clonidine and metoprolol.blood worsening. - Restart coreg.  DVT Prophylaxis: Heparin  Code Status: Full code  Family Communication: No family at bedside currently. Discussed with his father today over phone. Disposition Plan: Will remain in stepdown.  Consultants:  Renal  Urology  neurology  Procedures:  HD  I&D of penoscrotal abscess 3.27.2016  Antibiotics:  rocephin (indicate start date, and stop date if known)  HPI/Subjective: No compalins  Objective: Filed Vitals:   02/09/15 1618 02/09/15 2039 02/10/15 0537 02/10/15 1000  BP: 123/60 148/74 143/96 172/77  Pulse: 74 81 89 84  Temp: 97.8 F  (36.6 C) 98 F (36.7 C) 97.8 F (36.6 C) 97.8 F (36.6 C)  TempSrc: Oral Oral Oral Oral  Resp: Height:      Weight:      SpO2: 100% 100% 100% 100%    Intake/Output Summary (Last 24 hours) at 02/10/15 1204 Last data filed at 02/10/15 1012  Gross per 24 hour  Intake   1563 ml  Output    301 ml  Net   1262 ml   Filed Weights   02/08/15 1136 02/09/15 0730 02/09/15 1030  Weight: 76.3 kg (168 lb 3.4 oz) 82.4 kg (181 lb 10.5 oz) 80.4 kg (177 lb 4 oz)    Exam:  General: Alert, awake, oriented x3, in no acute distress.  HEENT: No bruits, no goiter.  Heart: Regular rate and rhythm. Lungs: Good air movement, clear  Abdomen: Soft, nontender, nondistended, positive bowel sounds.    Data Reviewed: Basic Metabolic Panel:  Recent Labs Lab 02/05/15 0307 02/06/15 0722 02/07/15 0825 02/08/15 0829 02/08/15 1959 02/09/15 0744  NA 137 130* 129* 127*  --  125*  K 3.8 4.2 4.1 4.7  --  4.7  CL 100 91* 94* 91*  --  88*  CO2 --  24  GLUCOSE 97 70 342* 319* 615* 225*  BUN 14 24* 20 34*  --  27*  CREATININE 3.58* 4.68* 4.00* 4.88*  --  3.65*  CALCIUM 9.2 9.5 8.8 8.8  --  8.7  PHOS 4.0 4.7* 3.3 4.0  --  3.4   Liver Function Tests:  Recent Labs Lab 02/04/15 1921 02/05/15 0307 02/06/15 0722 02/07/15 0825 02/08/15 0829 02/09/15 0744  AST 48*  --   --   --   --   --  ALT 17  --   --   --   --   --   ALKPHOS 241*  --   --   --   --   --   BILITOT 1.5*  --   --   --   --   --   PROT 7.0  --   --   --   --   --   ALBUMIN 2.8* 2.5* 2.5* 2.2* 2.2* 2.3*   No results for input(s): LIPASE, AMYLASE in the last 168 hours. No results for input(s): AMMONIA in the last 168 hours. CBC:  Recent Labs Lab 02/05/15 0307 02/06/15 0721 02/07/15 0825 02/08/15 0829 02/09/15 0744  WBC 7.3 11.6* 6.8 7.7 7.7  HGB 11.3* 11.0* 10.7* 10.4* 10.5*  HCT 35.5* 33.8* 32.6* 32.2* 32.3*  MCV 90.8 86.9 88.6 89.0 87.8  PLT 121* 177 117* 121* 123*   Cardiac Enzymes: No  results for input(s): CKTOTAL, CKMB, CKMBINDEX, TROPONINI in the last 168 hours. BNP (last 3 results) No results for input(s): BNP in the last 8760 hours.  ProBNP (last 3 results)  Recent Labs  03/26/14 1555  PROBNP 1212.0*    CBG:  Recent Labs Lab 02/09/15 1123 02/09/15 1617 02/09/15 2109 02/10/15 0752 02/10/15 1125  GLUCAP 147* 319* 345* 251* 344*    Recent Results (from the past 240 hour(s))  Urine culture     Status: None   Collection Time: 02/02/15  3:57 AM  Result Value Ref Range Status   Specimen Description URINE, RANDOM  Final   Special Requests NONE  Final   Colony Count   Final    >=100,000 COLONIES/ML Performed at [facility]    Culture   Final    Multiple bacterial morphotypes present, none predominant. Suggest appropriate recollection if clinically indicated. Performed at [facility]    Report Status 02/04/2015 FINAL  Final  Stat Gram stain     Status: None   Collection Time: 02/02/15 10:50 AM  Result Value Ref Range Status   Specimen Description PENILE DRAINAGE  Final   Special Requests Immunocompromised  Final   Gram Stain   Final    ABUNDANT WBC PRESENT,BOTH PMN AND MONONUCLEAR ABUNDANT GRAM NEGATIVE COCCI IN DIPLOS Gram Stain Report Called to,Read Back By and Verified With: A THOMPSON,RN 02/02/15 1207 M [facility]    Report Status 02/02/2015 FINAL  Final  Wound culture     Status: None   Collection Time: 02/02/15 10:50 AM  Result Value Ref Range Status   Specimen Description PENILE DRAINAGE  Final   Special Requests Immunocompromised  Final   Gram Stain   Final    ABUNDANT WBC PRESENT,BOTH PMN AND MONONUCLEAR ABUNDANT GRAM NEGATIVE COCCI Gram Stain Report Called to,Read Back By and Verified With: Gram Stain Report Called to,Read Back By and Verified With: A THOMPSON RN 02/02/15 1207 Judie Petit [facility] Performed at [facility]    Culture   Final    MULTIPLE ORGANISMS PRESENT, NONE PREDOMINANT Note: NO STAPHYLOCOCCUS AUREUS  ISOLATED NO GROUP A STREP (S.PYOGENES) ISOLATED Performed at [facility]    Report Status 02/05/2015 FINAL  Final  MRSA PCR Screening     Status: None   Collection Time: 02/02/15  5:13 PM  Result Value Ref Range Status   MRSA by PCR NEGATIVE NEGATIVE Final    Comment:        The GeneXpert MRSA Assay (FDA approved for NASAL specimens only), is one component of a comprehensive MRSA colonization  surveillance program. It is not intended to diagnose MRSA infection nor to guide or monitor treatment for MRSA infections.   Surgical pcr screen     Status: Abnormal   Collection Time: 02/03/15  6:27 AM  Result Value Ref Range Status   MRSA, PCR NEGATIVE NEGATIVE Final   Staphylococcus aureus POSITIVE (A) NEGATIVE Final    Comment:        The Xpert SA Assay (FDA approved for NASAL specimens in patients over 44 years of age), is one component of a comprehensive surveillance program.  Test performance has been validated by [facility] for patients greater than or equal to 11 year old. It is not intended to diagnose infection nor to guide or monitor treatment.   Anaerobic culture     Status: None   Collection Time: 02/03/15  8:29 AM  Result Value Ref Range Status   Specimen Description ABSCESS  Final   Special Requests PENIS  Final   Gram Stain   Final    ABUNDANT WBC PRESENT, PREDOMINANTLY PMN RARE SQUAMOUS EPITHELIAL CELLS PRESENT FEW YEAST ABUNDANT GRAM NEGATIVE RODS Performed at [facility]    Culture   Final    NO ANAEROBES ISOLATED Performed at [facility]    Report Status 02/08/2015 FINAL  Final  Culture, routine-abscess     Status: None   Collection Time: 02/03/15  8:29 AM  Result Value Ref Range Status   Specimen Description ABSCESS  Final   Special Requests PENIS  Final   Gram Stain   Final    MODERATE WBC PRESENT, PREDOMINANTLY PMN RARE SQUAMOUS EPITHELIAL CELLS PRESENT FEW YEAST ABUNDANT GRAM NEGATIVE RODS Performed at [facility]    Culture   Final    MODERATE ESCHERICHIA COLI Performed at [facility]    Report Status 02/05/2015 FINAL  Final   Organism ID, Bacteria ESCHERICHIA COLI  Final      Susceptibility   Escherichia coli - MIC*    AMPICILLIN >=32 RESISTANT Resistant     AMPICILLIN/SULBACTAM 16 INTERMEDIATE Intermediate     CEFAZOLIN <=4 SENSITIVE Sensitive     CEFEPIME <=1 SENSITIVE Sensitive     CEFTAZIDIME <=1 SENSITIVE Sensitive     CEFTRIAXONE <=1 SENSITIVE Sensitive     CIPROFLOXACIN >=4 RESISTANT Resistant     GENTAMICIN <=1 SENSITIVE Sensitive     IMIPENEM <=0.25 SENSITIVE Sensitive     PIP/TAZO <=4 SENSITIVE Sensitive     TOBRAMYCIN <=1 SENSITIVE Sensitive     TRIMETH/SULFA >=320 RESISTANT Resistant     * MODERATE ESCHERICHIA COLI     Studies: No results found.  Scheduled Meds: . amitriptyline  25 mg Oral QHS  . aspirin  81 mg Oral q morning - 10a  . cephALEXin  500 mg Oral 4 times per day  . cloNIDine  0.2 mg Transdermal Weekly  . fluconazole  200 mg Oral QHS  . heparin  5,000 Units Subcutaneous 3 times per day  . insulin aspart  0-5 Units Subcutaneous QHS  . insulin aspart  0-9 Units Subcutaneous TID WC  . insulin aspart  3 Units Subcutaneous TID WC  . insulin glargine  12 Units Subcutaneous BID  . isosorbide mononitrate  30 mg Oral BID  . levothyroxine  75 mcg Oral QAC breakfast  . metoprolol tartrate  25 mg Oral BID  . rosuvastatin  20 mg Oral QHS  . sodium chloride  3 mL Intravenous Q12H   Continuous Infusions:    FELIZ ORTIZ,  [facility]  [facility] Pager [phone]. If 7PM-7AM, please contact night-coverage at [email or web address], password [facility] 02/10/2015, 12:04 PM  LOS: 8 days

## 2015-02-10 NOTE — Progress Notes (Signed)
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 Dialysis at the [facility] unit. Now with recurrent L corporal infection at the penile base.   Pt has normal urethra, and still makes enough urine to void each spontaneously each day. Note MRSA NEG consistently; with wound gram stain showing gram NEG Cocci with culture showing E.coli sensitive to ceftriaxone, which was started 3/29. Also on fluconazole for yeast in gram stain.  Interval: antibiotics de-escalated to keflex. Wet-dry dressing changes BID. WBC normalized. Receiving dialysis per nephrology. No interval changes. Plan for AIR closer to home.   Objective: Vital signs in last 24 hours: Temp:  [97.5 F (36.4 C)-98 F (36.7 C)] 97.8 F (36.6 C) (04/03 1000) Pulse Rate:  [69-89] 84 (04/03 1000) Resp:  [14-18] 18 (04/03 1000) BP: (94-172)/(50-96) 172/77 mmHg (04/03 1000) SpO2:  [100 %] 100 % (04/03 1000) Weight:  [177 lb 4 oz (80.4 kg)] 177 lb 4 oz (80.4 kg) (04/02 1030)  Intake/Output from previous day: 04/02 0701 - 04/03 0700 In: 1200 [P.O.:1200] Out: 2201 [Urine:300; Stool:1] Intake/Output this shift: Total I/O In: 360 [P.O.:360] Out: -   Physical Exam:  General: depressed mental status c/w encephalopathy Penis: firm, indurated shafts bilaterally. Purulence expressed from incision site on right glans. 3 cm tunnel when probed. Packed with iodoform gauze. Drain exiting left shaft skin. Purulent fluid around the foley catheter that can be expressed. Scrotum: open I&D site at base of  left side of penis/left hemiscrotum. Brown fluid draining. No necrotic tissue noted. Soaked dressing was removed and replaced.   Lab Results: CBC Latest Ref Rng 02/09/2015 02/08/2015 02/07/2015  WBC 4.0 - 10.5 K/uL 7.7 7.7 6.8  Hemoglobin 13.0 - 17.0 g/dL 10.5(L) 10.4(L) 10.7(L)  Hematocrit 39.0 - 52.0 % 32.3(L) 32.2(L) 32.6(L)  Platelets 150 - 400 K/uL 123(L) 121(L) 117(L)    BMP Latest Ref Rng 02/09/2015 02/08/2015 02/08/2015  Glucose 70 - 99 mg/dL 500(B) 704(UG) 891(Q)  BUN 6 - 23 mg/dL 94(H) - 03(U)  Creatinine 0.50 - 1.35 mg/dL 8.82(C) - 0.03(K)  BUN/Creat Ratio 9 - 20 - - -  Sodium 135 - 145 mmol/L 125(L) - 127(L)  Potassium 3.5 - 5.1 mmol/L 4.7 - 4.7  Chloride 96 - 112 mmol/L 88(L) - 91(L)  CO2 19 - 32 mmol/L 24 - 22  Calcium 8.4 - 10.5 mg/dL 8.7 - 8.8    No results for input(s): LABPT, INR in the last 72 hours. No results for input(s): LABURIN in the last 72 hours. Results for orders placed or performed during the hospital encounter of 02/02/15  Urine culture     Status: None   Collection Time: 02/02/15  3:57 AM  Result Value Ref Range Status   Specimen Description URINE, RANDOM  Final   Special Requests NONE  Final   Colony Count   Final    >=100,000 COLONIES/ML Performed at [facility]    Culture   Final    Multiple bacterial morphotypes present, none predominant. Suggest appropriate recollection if clinically indicated. Performed at [facility]    Report Status 02/04/2015 FINAL  Final  Stat Gram stain  Status: None   Collection Time: 02/02/15 10:50 AM  Result Value Ref Range Status   Specimen Description PENILE DRAINAGE  Final   Special Requests Immunocompromised  Final   Gram Stain   Final    ABUNDANT WBC PRESENT,BOTH PMN AND MONONUCLEAR ABUNDANT GRAM NEGATIVE COCCI IN DIPLOS Gram Stain Report Called to,Read Back By and Verified With: A THOMPSON,RN 02/02/15 1207 M [facility]    Report Status 02/02/2015 FINAL  Final  Wound culture     Status: None    Collection Time: 02/02/15 10:50 AM  Result Value Ref Range Status   Specimen Description PENILE DRAINAGE  Final   Special Requests Immunocompromised  Final   Gram Stain   Final    ABUNDANT WBC PRESENT,BOTH PMN AND MONONUCLEAR ABUNDANT GRAM NEGATIVE COCCI Gram Stain Report Called to,Read Back By and Verified With: Gram Stain Report Called to,Read Back By and Verified With: A THOMPSON RN 02/02/15 1207 Judie Petit [facility] Performed at [facility]    Culture   Final    MULTIPLE ORGANISMS PRESENT, NONE PREDOMINANT Note: NO STAPHYLOCOCCUS AUREUS ISOLATED NO GROUP A STREP (S.PYOGENES) ISOLATED Performed at [facility]    Report Status 02/05/2015 FINAL  Final  MRSA PCR Screening     Status: None   Collection Time: 02/02/15  5:13 PM  Result Value Ref Range Status   MRSA by PCR NEGATIVE NEGATIVE Final    Comment:        The GeneXpert MRSA Assay (FDA approved for NASAL specimens only), is one component of a comprehensive MRSA colonization surveillance program. It is not intended to diagnose MRSA infection nor to guide or monitor treatment for MRSA infections.   Surgical pcr screen     Status: Abnormal   Collection Time: 02/03/15  6:27 AM  Result Value Ref Range Status   MRSA, PCR NEGATIVE NEGATIVE Final   Staphylococcus aureus POSITIVE (A) NEGATIVE Final    Comment:        The Xpert SA Assay (FDA approved for NASAL specimens in patients over 50 years of age), is one component of a comprehensive surveillance program.  Test performance has been validated by [facility] for patients greater than or equal to 14 year old. It is not intended to diagnose infection nor to guide or monitor treatment.   Anaerobic culture     Status: None   Collection Time: 02/03/15  8:29 AM  Result Value Ref Range Status   Specimen Description ABSCESS  Final   Special Requests PENIS  Final   Gram Stain   Final    ABUNDANT WBC PRESENT, PREDOMINANTLY PMN RARE SQUAMOUS EPITHELIAL CELLS  PRESENT FEW YEAST ABUNDANT GRAM NEGATIVE RODS Performed at [facility]    Culture   Final    NO ANAEROBES ISOLATED Performed at [facility]    Report Status 02/08/2015 FINAL  Final  Culture, routine-abscess     Status: None   Collection Time: 02/03/15  8:29 AM  Result Value Ref Range Status   Specimen Description ABSCESS  Final   Special Requests PENIS  Final   Gram Stain   Final    MODERATE WBC PRESENT, PREDOMINANTLY PMN RARE SQUAMOUS EPITHELIAL CELLS PRESENT FEW YEAST ABUNDANT GRAM NEGATIVE RODS Performed at [facility]    Culture   Final    MODERATE ESCHERICHIA COLI Performed at [facility]    Report Status 02/05/2015 FINAL  Final   Organism ID, Bacteria ESCHERICHIA COLI  Final  Susceptibility   Escherichia coli - MIC*    AMPICILLIN >=32 RESISTANT Resistant     AMPICILLIN/SULBACTAM 16 INTERMEDIATE Intermediate     CEFAZOLIN <=4 SENSITIVE Sensitive     CEFEPIME <=1 SENSITIVE Sensitive     CEFTAZIDIME <=1 SENSITIVE Sensitive     CEFTRIAXONE <=1 SENSITIVE Sensitive     CIPROFLOXACIN >=4 RESISTANT Resistant     GENTAMICIN <=1 SENSITIVE Sensitive     IMIPENEM <=0.25 SENSITIVE Sensitive     PIP/TAZO <=4 SENSITIVE Sensitive     TOBRAMYCIN <=1 SENSITIVE Sensitive     TRIMETH/SULFA >=320 RESISTANT Resistant     * MODERATE ESCHERICHIA COLI    Studies/Results: No results found.  Assessment/Plan: Improvement with aggressive I/S and cefepime. Now on keflex. Will leave drain and follow wound.   Change wet-to-dry dressings BID at base of penis as well as iodoform gauze into right shaft space BID.   Dr. Patsi Sears & Dr. Berneice Heinrich to discuss plan for drains and follow up plan after he goes to [facility] for rehab.    LOS: 8 days   Wednesday Ericsson C 02/10/2015, 10:11 AM

## 2015-02-10 NOTE — Progress Notes (Signed)
[facility] Progress Note   Subjective:  Having a lot of penile and scrotal pain today Sitting in the chair stark naked "I don't want anything to touch there" Has a lot of swelling, somewhat malodorous  Filed Vitals:   02/09/15 1618 02/09/15 2039 02/10/15 0537 02/10/15 1000  BP: 123/60 148/74 143/96 172/77  Pulse: 74 81 89 84  Temp: 97.8 F (36.6 C) 98 F (36.7 C) 97.8 F (36.6 C) 97.8 F (36.6 C)  TempSrc: Oral Oral Oral Oral  Resp: 18 16 18 18   Height:      Weight:      SpO2: 100% 100% 100% 100%    Physical Exam: BP 172/77 mmHg  Pulse 84  Temp(Src) 97.8 F (36.6 C) (Oral)  Resp 18  Ht 5\' 8"  (1.727 m)  Wt 80.4 kg (177 lb 4 oz)  BMI 26.96 kg/m2  SpO2 100% Sitting naked in his chair Tattoos  No JVD Chest clear bilaterally  S1S2 Widely split S2 No S3 no MRG Healed sternotomy scar Left upper arm AVF + bruit Abd soft, NTND,  GU penile edema, penrose drain left side, indwelling foley - all a little malodorous 1+ LE edema pretib bilat with scabs/excoriation over right pre-tib region.  Right TMA, absent L great toe Neuro is awake and alert   Recent Labs Lab 02/07/15 0825 02/08/15 0829 02/08/15 1959 02/09/15 0744  NA 129* 127*  --  125*  K 4.1 4.7  --  4.7  CL 94* 91*  --  88*  CO2 23 22  --  24  GLUCOSE 342* 319* 615* 225*  BUN 20 34*  --  27*  CREATININE 4.00* 4.88*  --  3.65*  CALCIUM 8.8 8.8  --  8.7  PHOS 3.3 4.0  --  3.4    Recent Labs Lab 02/04/15 1921  02/07/15 0825 02/08/15 0829 02/09/15 0744  AST 48*  --   --   --   --   ALT 17  --   --   --   --   ALKPHOS 241*  --   --   --   --   BILITOT 1.5*  --   --   --   --   PROT 7.0  --   --   --   --   ALBUMIN 2.8*  < > 2.2* 2.2* 2.3*  < > = values in this interval not displayed.  Recent Labs Lab 02/07/15 0825 02/08/15 0829 02/09/15 0744  WBC 6.8 7.7 7.7  HGB 10.7* 10.4* 10.5*  HCT 32.6* 32.2* 32.3*  MCV 88.6 89.0 87.8  PLT 117* 121* 123*     Medications .  amitriptyline  25 mg Oral QHS  . aspirin  81 mg Oral q morning - 10a  . cephALEXin  500 mg Oral 4 times per day  . cloNIDine  0.2 mg Transdermal Weekly  . fluconazole  200 mg Oral QHS  . heparin  5,000 Units Subcutaneous 3 times per day  . insulin aspart  0-5 Units Subcutaneous QHS  . insulin aspart  0-9 Units Subcutaneous TID WC  . insulin aspart  3 Units Subcutaneous TID WC  . insulin glargine  12 Units Subcutaneous BID  . isosorbide mononitrate  30 mg Oral BID  . levothyroxine  75 mcg Oral QAC breakfast  . metoprolol tartrate  25 mg Oral BID  . rosuvastatin  20 mg Oral QHS  . sodium chloride  3 mL Intravenous Q12H   HD: TTS [facility]  Ord 3h  80kg   Heparin none   L arm AVF Will have lower EDW at discharge  02/04/15  77.9 pre/75.6 post 02/06/15  76.5 pre/74.3 post 02/08/15  78.6 pre/76.3 post 02/09/15  82.4 pre/80.4 post   Assessment/Recommendations  1. Penile/ scrotal abcess (recurrent)  - Hx penile abscess complicating penile implant placement 07/2014; implant removed Oct '15 d/t infection -  s/p I&D in the OR.  Culture + EColi. On keflex/fluconazole/dressing changes. Per urology 2. 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 ESRD on HD - MWF [facility]. Next HD Monday.  He should have a lower EDW at discharge. Since he has been more awake has been gaining too much fluid between his treatments. Has 2 + edema at old EDW of 59...  4. HTN on catapres patch TTS1 and metoprolol  5. DKA / DM  - back on SQ insulin 6. PAD hx toe amps and TMA 7. CAD prior CABG 8. Hypothyroidism on replacement 9. AMS - encephalopathy improving - I suspect at baseline now  Camille Bal, MD [facility] [phone] Pager 02/10/2015, 12:10 PM

## 2015-02-11 LAB — RENAL FUNCTION PANEL
ALBUMIN: 2.5 g/dL — AB (ref 3.5–5.2)
Anion gap: 12 (ref 5–15)
BUN: 48 mg/dL — ABNORMAL HIGH (ref 6–23)
CALCIUM: 9.2 mg/dL (ref 8.4–10.5)
CO2: 26 mmol/L (ref 19–32)
CREATININE: 4.59 mg/dL — AB (ref 0.50–1.35)
Chloride: 91 mmol/L — ABNORMAL LOW (ref 96–112)
GFR calc Af Amer: 16 mL/min — ABNORMAL LOW (ref >90)
GFR, EST NON AFRICAN AMERICAN: 14 mL/min — AB (ref >90)
Glucose, Bld: 137 mg/dL — ABNORMAL HIGH (ref 70–99)
PHOSPHORUS: 4.6 mg/dL (ref 2.3–4.6)
Potassium: 5 mmol/L (ref 3.5–5.1)
Sodium: 129 mmol/L — ABNORMAL LOW (ref 135–145)

## 2015-02-11 LAB — CBC
HCT: 34 % — ABNORMAL LOW (ref 39.0–52.0)
Hemoglobin: 11 g/dL — ABNORMAL LOW (ref 13.0–17.0)
MCH: 28.6 pg (ref 26.0–34.0)
MCHC: 32.4 g/dL (ref 30.0–36.0)
MCV: 88.5 fL (ref 78.0–100.0)
PLATELETS: 206 10*3/uL (ref 150–400)
RBC: 3.84 MIL/uL — AB (ref 4.22–5.81)
RDW: 17 % — ABNORMAL HIGH (ref 11.5–15.5)
WBC: 10.6 10*3/uL — AB (ref 4.0–10.5)

## 2015-02-11 LAB — GLUCOSE, CAPILLARY
Glucose-Capillary: 148 mg/dL — ABNORMAL HIGH (ref 70–99)
Glucose-Capillary: 154 mg/dL — ABNORMAL HIGH (ref 70–99)

## 2015-02-11 MED ORDER — CEPHALEXIN 500 MG PO CAPS: 500.0000 mg | ORAL_CAPSULE | Freq: Four times a day (QID) | ORAL | Status: AC

## 2015-02-11 MED ORDER — INSULIN ASPART 100 UNIT/ML ~~LOC~~ SOLN: 4.0000 [IU] | Freq: Three times a day (TID) | SUBCUTANEOUS | Status: DC

## 2015-02-11 MED ORDER — FLUCONAZOLE 200 MG PO TABS: 200.0000 mg | ORAL_TABLET | Freq: Every day | ORAL | Status: AC

## 2015-02-11 MED ORDER — INSULIN GLARGINE 100 UNIT/ML ~~LOC~~ SOLN: 20.0000 [IU] | Freq: Every day | SUBCUTANEOUS | Status: DC

## 2015-02-11 MED ORDER — HYDROCODONE-ACETAMINOPHEN 5-325 MG PO TABS: 1.0000 | ORAL_TABLET | Freq: Four times a day (QID) | ORAL | Status: DC | PRN

## 2015-02-11 NOTE — Progress Notes (Signed)
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 Pt for discharge to rehab facility in [place]. Drains in tact.     No acute urologic events overnight. Ambulation:   positive Flatus:    positive Bowel movement  positive  Pain: some relief  Objective:  Blood pressure 122/63, pulse 87, temperature 98.5 F (36.9 C), temperature source Oral, resp. rate 18, height 5\' 8"  (1.727 m), weight 79.6 kg (175 lb 7.8 oz), SpO2 100 %.  Physical Exam:  General:  No acute distress, awake  Genitourinary:  Drains sutured in place Foley:in place.     I/O last 3 completed shifts: In: 843 [P.O.:840; I.V.:3] Out: 300 [Urine:300]  Recent Labs     02/09/15  0744  02/11/15  0755  HGB  10.5*  11.0*  WBC  7.7  10.6*  PLT  123*  206    Recent Labs     02/09/15  0744  02/11/15  0755  NA  125*  129*  K  4.7  5.0  CL  88*  91*  CO2  24  26  BUN  27*  48*  CREATININE  3.65*  4.59*  CALCIUM  8.7  9.2  GFRNONAA  19*  14*  GFRAA  21*  16*     No results for input(s): INR, APTT in the last 72 hours.  Invalid input(s): PT   Invalid input(s): ABG  Assessment/Plan:  Continue any current medications. Continue foley Continue drains. RTC in [place] for drain removal. Continue wet-to-dry dressings each day. Anticipate slow recovery in this vasculopath.

## 2015-02-11 NOTE — Progress Notes (Signed)
PT Cancellation Note  Patient Details Name: Taylor [name] MRN: [id number] DOB: Aug 07, 1971   Cancelled Treatment:    Reason Eval/Treat Not Completed: Patient at procedure or test/unavailable   Will follow up later today as time allows;  Otherwise, will follow up for PT tomorrow;   Thank you,  Van Clines, PT  Acute Rehabilitation Services Pager [phone] Office [phone]     Van Clines Hamff 02/11/2015, 9:00 AM

## 2015-02-11 NOTE — Discharge Summary (Signed)
Physician Discharge Summary  Taylor [name] ZOX:[id number] DOB: Feb 12, 1971 DOA: 02/02/2015  PCP: [name], MD  Admit date: 02/02/2015 Discharge date: 02/11/2015  Time spent: 35 minutes  Recommendations for Outpatient Follow-up:  1. Discharge to skilled nursing facility in [place]. 2. Pt  will be followed by urology( Dr Berneice Heinrich tanenbaum)  as outpatient. Recommend wet-to-dry dressing twice a day at the base of penis as well as Iodoform gauze into right shaft space twice a day.  Continue oral Keflex until 4/21 and fluconazole until 4/10.  Discharge Diagnoses:  Principal Problem: Sepsis secondary to penile/scrotal abscess  Active Problems:     Diabetic ketoacidosis without coma associated with type 1 diabetes mellitus   Penile abscess   Scrotal abscess   Hypothyroidism   End stage renal disease 2/2 to DM ty 1-Dialysis started ~2009--Dialyses t/Th/Sat-[place]   S/P CABG (coronary artery bypass graft)   HTN (hypertension)   Panic attacks   Organic sexual dysfunction   Cellulitis of groin   Accelerated hypertension   Acute delirium   Discharge Condition: Fair  Diet recommendation: Diabetic/renal  Filed Weights   02/09/15 1030 02/11/15 0745 02/11/15 1147  Weight: 80.4 kg (177 lb 4 oz) 83.8 kg (184 lb 11.9 oz) 79.6 kg (175 lb 7.8 oz)    History of present illness:  Please refer to admission H&P for details, in brief, 44 year old male with history of end-stage renal disease on dialysis (M, W, F), diabetic nephropathy, CAD status post CABG, 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 Patient was septic on presentation with CT scan of the abdomen done showing penile abscess.    Hospital Course:  Sepsis secondary to Penile and scrotal abscess Patient started on empiric IV antibiotics. Seen by urology and underwent I&D on 3/27. Cultures growing Escherichia coli and yeast in  gram stain. Patient switched to oral Keflex. Added difucan. Sepsis resolved. -Appreciate neurology recommendations on dressing and will plan on outpatient follow-up. -Seen by physical therapy and recommended skilled nursing facility. -plan on total 3 weeks of oral Keflex (until 4/21) and total 10 days of Diflucan until 4/10 . Dressing as outlined above. Prescribed when necessary Vicodin for pain.  Severe DKA in type I uncontrolled diabetes mellitus Possibly triggered by sepsis. Required IV insulin on admission. As underlying uncontrolled diabetes mellitus with last A1c of 15. Fingersticks now stable. Have adjusted Lantus to 20 units daily and pre-meal aspart to 4 units 3 times a day. (was actually on higher dose of Lantus and aspart prior to admission but blood sugar has currently been stable). Needs close outpatient follow-up as blood sugar have been poorly controlled.  Acute encephalopathy Likely secondary to underlying infection. Ativan and Haldol were discontinued. Head CT and EEG unremarkable. Neurology consulted and thought this is likely due to underlying infectious process/hyperglycemia. Mental status has now cleared to baseline. Minimize pain medications. I have discontinued his Celexa Lyrica  Hypothyroidism Continue Synthroid  End-stage renal disease on dialysis Followed by renal. Received hemodialysis today. Has leg edema with volume overload which will be managed during dialysis.  Accelerated hypertension Clonidine and carvedilol resumed. continue Lasix. Blood pressure is stable.  Since seen by physical therapy and recommended skilled nursing facility. Patient is clinically stable to be discharged to skilled nursing facility.    Procedures:  Dialysis  I&D of penile/scrotal abscess on 3/27  Consultations:  Renal  Urology (Dr. Berneice Heinrich)  Neurology  Discharge Exam: Filed Vitals:  02/11/15 1147  BP: 146/76  Pulse: 81  Temp: 97 F (36.1 C)  Resp: 10    General:  Middle aged male in no acute distress HEENT: No pallor, moist oral mucosa, no JVD, supple neck Chest: Clear to auscultation bilaterally, no added sounds CVS: Normal S1 and S2, no murmurs rub or gallop GI: Soft, nondistended, nontender, bowel sounds present, indurated penile shaft, no pus drainage, packing in place, Foley catheter in place without purulent drainage. Dressing over scrotum appears clean Musculoskeletal: Warm, 2+ pitting edema bilaterally CNS: Alert and oriented  Discharge Instructions    Current Discharge Medication List    START taking these medications   Details  cephALEXin (KEFLEX) 500 MG capsule Take 1 capsule (500 mg total) by mouth every 6 (six) hours. Qty: 72 capsule, Refills: 0    fluconazole (DIFLUCAN) 200 MG tablet Take 1 tablet (200 mg total) by mouth at bedtime. Qty: 7 tablet, Refills: 0      CONTINUE these medications which have CHANGED   Details  HYDROcodone-acetaminophen (NORCO/VICODIN) 5-325 MG per tablet Take 1 tablet by mouth every 6 (six) hours as needed for moderate pain. Qty: 15 tablet, Refills: 0    insulin aspart (NOVOLOG) 100 UNIT/ML injection Inject 4 Units into the skin 3 (three) times daily with meals. Qty: 10 mL, Refills: 0    insulin glargine (LANTUS) 100 UNIT/ML injection Inject 0.2 mLs (20 Units total) into the skin daily. Qty: 10 mL, Refills: 11      CONTINUE these medications which have NOT CHANGED   Details  amitriptyline (ELAVIL) 25 MG tablet Take 1 tablet (25 mg total) by mouth at bedtime. Qty: 30 tablet, Refills: 1    aspirin 81 MG tablet Take 81 mg by mouth every morning.     calcium carbonate (TUMS - DOSED IN MG ELEMENTAL CALCIUM) 500 MG chewable tablet Chew 1-2 tablets (200-400 mg of elemental calcium total) by mouth 2 (two) times daily as needed for indigestion or heartburn. Qty: 1 tablet, Refills: 0    carvedilol (COREG) 25 MG tablet Take 25 mg by mouth 2 (two) times daily with a meal.    cinacalcet (SENSIPAR) 60  MG tablet Take 60 mg by mouth at bedtime. With biggest meal    cloNIDine (CATAPRES) 0.1 MG tablet Take 0.1 mg by mouth 2 (two) times daily.    furosemide (LASIX) 80 MG tablet Take 80 mg by mouth 2 (two) times daily.    isosorbide mononitrate (IMDUR) 30 MG 24 hr tablet Take 30 mg by mouth 2 (two) times daily.    levothyroxine (SYNTHROID, LEVOTHROID) 75 MCG tablet Take 1 tablet (75 mcg total) by mouth daily before breakfast. Qty: 90 tablet, Refills: 1    Nutritional Supplements (FEEDING SUPPLEMENT, NEPRO CARB STEADY,) LIQD Take 237 mLs by mouth as needed (missed meal during dialysis.). Qty: 30 Can, Refills: 0    nystatin-triamcinolone (MYCOLOG II) cream Apply 1 application topically every 8 (eight) hours as needed (rash).     Omega-3 Fatty Acids (OMEGA 3 PO) Take 1,000 mg by mouth daily.    PARoxetine (PAXIL) 20 MG tablet Take 20 mg by mouth every morning.     rosuvastatin (CRESTOR) 20 MG tablet Take 1 tablet (20 mg total) by mouth at bedtime. Qty: 30 tablet, Refills: 5    sevelamer carbonate (RENVELA) 800 MG tablet Take 800 mg by mouth 3 (three) times daily with meals.    acetaminophen (TYLENOL) 500 MG tablet Take 1,000 mg by mouth every 6 (six) hours as  needed (pain).      STOP taking these medications     citalopram (CELEXA) 20 MG tablet      LYRICA 150 MG capsule      omeprazole (PRILOSEC) 40 MG capsule      permethrin (ELIMITE) 5 % cream        Allergies  Allergen Reactions  . Amoxicillin-Pot Clavulanate Nausea And Vomiting  . Hydromorphone Hcl     "body started down"  08/22/14 - pt states no longer has problems with this  . Rifampin Nausea And Vomiting   Follow-up Information    Follow up with [name], MD In 2 weeks.   Specialty:  Urology   Why:  office will call   Contact information:   [address] [place] [address] [phone]       Please follow up.   Why:  MD at rehab       The results of significant diagnostics from this  hospitalization (including imaging, microbiology, ancillary and laboratory) are listed below for reference.    Significant Diagnostic Studies: Ct Head Wo Contrast  02/03/2015   CLINICAL DATA:  Altered mental status recent pelvic surgery  EXAM: CT HEAD WITHOUT CONTRAST  TECHNIQUE: Contiguous axial images were obtained from the base of the skull through the vertex without intravenous contrast.  COMPARISON:  None.  FINDINGS: No skull fracture is noted. Mild mucosal thickening right maxillary sinus. The mastoid air cells are unremarkable. Atherosclerotic calcifications of carotid siphon.  No intracranial hemorrhage, mass effect or midline shift. No intra or extra-axial fluid collection. No acute cortical infarction. No mass lesion is noted on this unenhanced scan.  IMPRESSION: No acute intracranial abnormality. Atherosclerotic calcifications are noted bilateral carotid siphon. No definite acute cortical infarction. Mild mucosal thickening right maxillary sinus.   Electronically Signed   By: Natasha Mead M.D.   On: 02/03/2015 13:52   Ct Pelvis W/o & W Cm  02/02/2015   CLINICAL DATA:  Status post penile prosthesis placement and removal, with underlying chronic wound. Assess for pelvic or inguinal abscess.  EXAM: CT PELVIS WITH AND WITHOUT CONTRAST  TECHNIQUE: Multidetector CT imaging of the pelvis was performed following the standard protocol before and following the bolus administration of intravenous contrast.  CONTRAST:  OMNIPAQUE IOHEXOL 300 MG/ML  SOLN  COMPARISON:  CT of the abdomen and pelvis from 12/19/2014  FINDINGS: There is a focal 6.2 x 4.0 x 4.0 cm collection of fluid and air at the inferior left inguinal region directly adjacent to the left side of the penile shaft, anterior to the left spermatic cord, with minimal peripheral enhancement on contrast enhanced images, compatible with an abscess.  In addition, there is a collection of fluid and trace air extending along the left corpora cavernosum  within the distal penis, measuring approximately 4.6 x 2.2 x 1.6 cm, with minimal peripheral enhancement. There may be minimal communication between these two collections, suggested on noncontrast images, though not definitely seen on contrast-enhanced images.  Surrounding soft tissue inflammation is seen. Edema is noted about the scrotum. 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 Mild soft tissue edema is noted extending superiorly, with mild postoperative soft tissue edema at the right inguinal region, and mild soft tissue edema along both flanks.  Minimal soft tissue air at the anterior right lower quadrant abdominal wall is thought to reflect an injection site.  A small to moderate amount of borderline complex fluid is noted within the lower abdomen and pelvis. There is  somewhat unusual wall thickening at the cecum, distal descending colon and distal sigmoid colon. This could reflect underlying mild colitis, though a cecal mass cannot be entirely excluded. Would correlate for evidence of colitis, and after resolution of the acute process, would consider colonoscopy to assess the ascending colon and cecum.  There is decompression of the bladder, with a Foley catheter in place. Diffuse bladder wall thickening may reflect decompression, though would correlate for evidence of cystitis. Minimal vascular calcifications are seen. Visualized inguinal nodes are normal in size. The prostate is borderline normal in size.  No acute osseous abnormalities are seen.  IMPRESSION: 1. 6.2 x 4.0 x 4.7 cm evolving abscess noted at the inferior left inguinal region, directly adjacent to the left side of penile shaft and anterior to the left spermatic cord, containing fluid and air. 2. 4.6 x 2.2 x 1.6 cm likely evolving abscess noted extending along the left corpora cavernosum within the distal penis, containing fluid and trace air. There may be minimal communication between these two fluid collections, though this is not well seen. 3.  Surrounding soft tissue inflammation and edema seen about the scrotum. 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 Mild soft tissue edema extends superiorly, with mild postoperative soft tissue edema at the right inguinal region, and mild soft tissue edema along both flanks. 4. Somewhat unusual wall thickening at the cecum, distal descending colon and distal sigmoid colon. This could reflect underlying acute mild infectious or inflammatory colitis. Would correlate clinically for associated findings. A cecal mass cannot be entirely excluded. After resolution of the acute process, would consider colonoscopy to assess the ascending colon and cecum. 5. Small to moderate amount of borderline complex fluid noted within the lower abdomen and pelvis. 6. Bladder decompressed and not well assessed; diffuse bladder wall thickening likely reflects decompression, though would correlate to exclude cystitis.  These results were called by telephone at the time of interpretation on 02/02/2015 at 12:10 pm to Dr. [name], who verbally acknowledged these results.   Electronically Signed   By: Roanna Raider M.D.   On: 02/02/2015 12:11    Microbiology: Recent Results (from the past 240 hour(s))  Urine culture     Status: None   Collection Time: 02/02/15  3:57 AM  Result Value Ref Range Status   Specimen Description URINE, RANDOM  Final   Special Requests NONE  Final   Colony Count   Final    >=100,000 COLONIES/ML Performed at [facility]    Culture   Final    Multiple bacterial morphotypes present, none predominant. Suggest appropriate recollection if clinically indicated. Performed at [facility]    Report Status 02/04/2015 FINAL  Final  Stat Gram stain     Status: None   Collection Time: 02/02/15 10:50 AM  Result Value Ref Range Status   Specimen Description PENILE DRAINAGE  Final   Special Requests Immunocompromised  Final   Gram Stain   Final    ABUNDANT WBC PRESENT,BOTH PMN AND  MONONUCLEAR ABUNDANT GRAM NEGATIVE COCCI IN DIPLOS Gram Stain Report Called to,Read Back By and Verified With: A THOMPSON,RN 02/02/15 1207 [facility]    Report Status 02/02/2015 FINAL  Final  Wound culture     Status: None   Collection Time: 02/02/15 10:50 AM  Result Value Ref Range Status   Specimen Description PENILE DRAINAGE  Final   Special Requests Immunocompromised  Final   Gram Stain   Final    ABUNDANT WBC PRESENT,BOTH PMN AND  MONONUCLEAR ABUNDANT GRAM NEGATIVE COCCI Gram Stain Report Called to,Read Back By and Verified With: Gram Stain Report Called to,Read Back By and Verified With: A THOMPSON RN 02/02/15 1207 [facility] Performed at [facility]    Culture   Final    MULTIPLE ORGANISMS PRESENT, NONE PREDOMINANT Note: NO STAPHYLOCOCCUS AUREUS ISOLATED NO GROUP A STREP (S.PYOGENES) ISOLATED Performed at [facility]    Report Status 02/05/2015 FINAL  Final  MRSA PCR Screening     Status: None   Collection Time: 02/02/15  5:13 PM  Result Value Ref Range Status   MRSA by PCR NEGATIVE NEGATIVE Final    Comment:        The GeneXpert MRSA Assay (FDA approved for NASAL specimens only), is one component of a comprehensive MRSA colonization surveillance program. It is not intended to diagnose MRSA infection nor to guide or monitor treatment for MRSA infections.   Surgical pcr screen     Status: Abnormal   Collection Time: 02/03/15  6:27 AM  Result Value Ref Range Status   MRSA, PCR NEGATIVE NEGATIVE Final   Staphylococcus aureus POSITIVE (A) NEGATIVE Final    Comment:        The Xpert SA Assay (FDA approved for NASAL specimens in patients over 71 years of age), is one component of a comprehensive surveillance program.  Test performance has been validated by [facility] for patients greater than or equal to 101 year old. It is not intended to diagnose infection nor to guide or monitor treatment.   Anaerobic culture     Status: None   Collection  Time: 02/03/15  8:29 AM  Result Value Ref Range Status   Specimen Description ABSCESS  Final   Special Requests PENIS  Final   Gram Stain   Final    ABUNDANT WBC PRESENT, PREDOMINANTLY PMN RARE SQUAMOUS EPITHELIAL CELLS PRESENT FEW YEAST ABUNDANT GRAM NEGATIVE RODS Performed at [facility]    Culture   Final    NO ANAEROBES ISOLATED Performed at [facility]    Report Status 02/08/2015 FINAL  Final  Culture, routine-abscess     Status: None   Collection Time: 02/03/15  8:29 AM  Result Value Ref Range Status   Specimen Description ABSCESS  Final   Special Requests PENIS  Final   Gram Stain   Final    MODERATE WBC PRESENT, PREDOMINANTLY PMN RARE SQUAMOUS EPITHELIAL CELLS PRESENT FEW YEAST ABUNDANT GRAM NEGATIVE RODS Performed at [facility]    Culture   Final    MODERATE ESCHERICHIA COLI Performed at [facility]    Report Status 02/05/2015 FINAL  Final   Organism ID, Bacteria ESCHERICHIA COLI  Final      Susceptibility   Escherichia coli - MIC*    AMPICILLIN >=32 RESISTANT Resistant     AMPICILLIN/SULBACTAM 16 INTERMEDIATE Intermediate     CEFAZOLIN <=4 SENSITIVE Sensitive     CEFEPIME <=1 SENSITIVE Sensitive     CEFTAZIDIME <=1 SENSITIVE Sensitive     CEFTRIAXONE <=1 SENSITIVE Sensitive     CIPROFLOXACIN >=4 RESISTANT Resistant     GENTAMICIN <=1 SENSITIVE Sensitive     IMIPENEM <=0.25 SENSITIVE Sensitive     PIP/TAZO <=4 SENSITIVE Sensitive     TOBRAMYCIN <=1 SENSITIVE Sensitive     TRIMETH/SULFA >=320 RESISTANT Resistant     * MODERATE ESCHERICHIA COLI     Labs: Basic Metabolic Panel:  Recent Labs Lab 02/06/15 0722 02/07/15 0825 02/08/15 0829 02/08/15 1959  02/09/15 0744 02/11/15 0755  NA 130* 129* 127*  --  125* 129*  K 4.2 4.1 4.7  --  4.7 5.0  CL 91* 94* 91*  --  88* 91*  CO2 22 23 22   --  24 26  GLUCOSE 70 342* 319* 615* 225* 137*  BUN 24* 20 34*  --  27* 48*  CREATININE 4.68* 4.00* 4.88*  --  3.65* 4.59*   CALCIUM 9.5 8.8 8.8  --  8.7 9.2  PHOS 4.7* 3.3 4.0  --  3.4 4.6   Liver Function Tests:  Recent Labs Lab 02/04/15 1921  02/06/15 0722 02/07/15 0825 02/08/15 0829 02/09/15 0744 02/11/15 0755  AST 48*  --   --   --   --   --   --   ALT 17  --   --   --   --   --   --   ALKPHOS 241*  --   --   --   --   --   --   BILITOT 1.5*  --   --   --   --   --   --   PROT 7.0  --   --   --   --   --   --   ALBUMIN 2.8*  < > 2.5* 2.2* 2.2* 2.3* 2.5*  < > = values in this interval not displayed. No results for input(s): LIPASE, AMYLASE in the last 168 hours. No results for input(s): AMMONIA in the last 168 hours. CBC:  Recent Labs Lab 02/06/15 0721 02/07/15 0825 02/08/15 0829 02/09/15 0744 02/11/15 0755  WBC 11.6* 6.8 7.7 7.7 10.6*  HGB 11.0* 10.7* 10.4* 10.5* 11.0*  HCT 33.8* 32.6* 32.2* 32.3* 34.0*  MCV 86.9 88.6 89.0 87.8 88.5  PLT 177 117* 121* 123* 206   Cardiac Enzymes: No results for input(s): CKTOTAL, CKMB, CKMBINDEX, TROPONINI in the last 168 hours. BNP: BNP (last 3 results) No results for input(s): BNP in the last 8760 hours.  ProBNP (last 3 results)  Recent Labs  03/26/14 1555  PROBNP 1212.0*    CBG:  Recent Labs Lab 02/10/15 0752 02/10/15 1125 02/10/15 1716 02/10/15 2055 02/11/15 1451  GLUCAP 251* 344* 151* 122* 148*       Signed:  Fredonia Casalino  [facility] 02/11/2015, 2:59 PM

## 2015-02-11 NOTE — Discharge Instructions (Signed)
Diabetic Ketoacidosis °Diabetic ketoacidosis (DKA) is a life-threatening complication of type 1 diabetes. It must be quickly recognized and treated. Treatment requires hospitalization. °CAUSES  °When there is no insulin in the body, glucose (sugar) cannot be used, and the body breaks down fat for energy. When fat breaks down, acids (ketones) build up in the blood. Very high levels of glucose and high levels of acids lead to severe loss of body fluids (dehydration) and other dangerous chemical changes. This stresses your vital organs and can cause coma or death. °SIGNS AND SYMPTOMS  °· Tiredness (fatigue). °· Weight loss. °· Excessive thirst. °· Ketones in your urine. °· Light-headedness. °· Fruity or sweet smelling breath. °· Excessive urination. °· Visual changes. °· Confusion or irritability. °· Nausea or vomiting. °· Rapid breathing. °· Stomachache or abdominal pain. °DIAGNOSIS  °Your health care provider will diagnose DKA based on your history, physical exam, and blood tests. The health care provider will check to see if you have another illness that caused you to go into DKA. Most of this will be done quickly in an emergency room. °TREATMENT  °· Fluid replacement to correct dehydration. °· Insulin. °· Correction of electrolytes, such as potassium and sodium. °· Antibiotic medicines. °PREVENTION °· Always take your insulin. Do not skip your insulin injections. °· If you are sick, treat yourself quickly. Your body often needs more insulin to fight the illness. °· Check your blood glucose regularly. °· Check urine ketones if your blood glucose is greater than 240 milligrams per deciliter (mg/dL). °· Do not use outdated (expired) insulin. °· If your blood glucose is high, drink plenty of fluids. This helps flush out ketones. °HOME CARE INSTRUCTIONS  °· If you are sick, follow the advice of your health care provider. °· To prevent dehydration, drink enough water and fluids to keep your urine clear or pale  yellow. °¨ If you cannot eat, alternate between drinking fluids with sugar (soda, juices, flavored gelatin) and salty fluids (broth, bouillon). °¨ If you can eat, follow your usual diet and drink sugar-free liquids (water, diet drinks). °· Always take your usual dose of insulin. If you cannot eat or if your glucose is getting too low, call your health care provider for further instructions. °· Continue to monitor your blood or urine ketones every 3-4 hours around the clock. Set your alarm clock or have someone wake you up. If you are too sick, have someone test it for you. °· Rest and avoid exercise. °SEEK MEDICAL CARE IF:  °· You have a fever. °· You have ketones in your urine, or your blood glucose is higher than a level your health care provider suggests. You may need extra insulin. Call your health care provider if you need advice on adjusting your insulin. °· You cannot drink at least a tablespoon (15 mL) of fluid every 15-20 minutes. °· You have been vomiting for more than 2 hours. °· You have symptoms of DKA: °¨ Fruity smelling breath. °¨ Breathing faster or slower. °¨ Becoming very sleepy. °SEEK IMMEDIATE MEDICAL CARE IF:  °· You have signs of dehydration: °¨ Decreased urination. °¨ Increased thirst. °¨ Dry skin and mouth. °¨ Light-headedness. °· Your blood glucose is very high (as advised by your health care provider) twice in a row. °· You faint. °· You have chest pain or trouble breathing. °· You have a sudden, severe headache. °· You have sudden weakness in one arm or one leg. °· You have sudden trouble speaking or swallowing. °· You   have vomiting or diarrhea that is getting worse after 3 hours. °· You have abdominal pain. °MAKE SURE YOU:  °· Understand these instructions. °· Will watch your condition. °· Will get help right away if you are not doing well or get worse. °Document Released: 10/23/2000 Document Revised: 10/31/2013 Document Reviewed: 05/01/2009 °[employer]® Patient Information ©2015 [employer]  [employer]. This information is not intended to replace advice given to you by your health care provider. Make sure you discuss any questions you have with your health care provider. ° °

## 2015-02-11 NOTE — Procedures (Signed)
Pt seen on HD.  Ap 170 Vp 260.  BFR 400.  SBP 148.   + edema

## 2015-02-11 NOTE — Clinical Social Work Psychosocial (Signed)
Clinical Social Work Department BRIEF PSYCHOSOCIAL ASSESSMENT 02/11/2015  Patient:  Taylor [name], Taylor [name]     Account Number:  [id number]     Admit date:  02/02/2015  Clinical Social Worker:  Delmer Islam  Date/Time:  02/11/2015 03:56 AM  Referred by:  Physician  Date Referred:  02/08/2015 Referred for  SNF Placement   Other Referral:   Interview type:  Patient Other interview type:    PSYCHOSOCIAL DATA Living Status:  ALONE Admitted from facility:   Level of care:   Primary support name:  Asim Gersten Primary support relationship to patient:  PARENT Degree of support available:   Patient's father was at the bedside on the 4th.    CURRENT CONCERNS Current Concerns  Post-Acute Placement   Other Concerns:    SOCIAL WORK ASSESSMENT / PLAN CSW talked with patient who is alert and oriented regarding skilled facility placement for rehab. Patient in agreement and choose [facility] skilled facility. Patient aware that he will be discharged to SNF today.   Assessment/plan status:  No Further Intervention Required Other assessment/ plan:   Information/referral to community resources:   None needed or requested a this time.    PATIENT'S/FAMILY'S RESPONSE TO PLAN OF CARE: Patient receptive to speaking with CSW and in agreement with short-term rehab.       Genelle Bal, MSW, LCSW Licensed Clinical Social Worker Clinical Social Work Department [employer] [phone]

## 2015-02-11 NOTE — Telephone Encounter (Signed)
It is fine to refill Carvedilol 25 mg bid . He is no longer on Metoprolol. He is due for an office visit.

## 2015-02-12 MED ORDER — CARVEDILOL 25 MG PO TABS: 25.0000 mg | ORAL_TABLET | Freq: Two times a day (BID) | ORAL | Status: DC

## 2015-02-18 ENCOUNTER — Emergency Department
Admit: 2015-02-18 | Disposition: A | Discharge status: Skilled Nursing Facility | Payer: Self-pay | Admitting: Emergency Medicine

## 2015-02-18 LAB — CBC WITH DIFFERENTIAL/PLATELET
BASOS ABS: 0.2 10*3/uL — AB (ref 0.0–0.1)
Basophil %: 1.4 %
EOS PCT: 0.7 %
Eosinophil #: 0.1 10*3/uL (ref 0.0–0.7)
HCT: 33.6 % — AB (ref 40.0–52.0)
HGB: 10.4 g/dL — ABNORMAL LOW (ref 13.0–18.0)
LYMPHS ABS: 0.5 10*3/uL — AB (ref 1.0–3.6)
Lymphocyte %: 4.2 %
MCH: 28.4 pg (ref 26.0–34.0)
MCHC: 30.9 g/dL — ABNORMAL LOW (ref 32.0–36.0)
MCV: 92 fL (ref 80–100)
MONOS PCT: 3.9 %
Monocyte #: 0.5 x10 3/mm (ref 0.2–1.0)
NEUTROS ABS: 11.3 10*3/uL — AB (ref 1.4–6.5)
Neutrophil %: 89.8 %
PLATELETS: 164 10*3/uL (ref 150–440)
RBC: 3.64 10*6/uL — AB (ref 4.40–5.90)
RDW: 18.1 % — AB (ref 11.5–14.5)
WBC: 12.6 10*3/uL — AB (ref 3.8–10.6)

## 2015-02-18 LAB — LACTIC ACID, PLASMA: Lactic Acid, Venous: 1.9 mmol/L

## 2015-02-18 LAB — COMPREHENSIVE METABOLIC PANEL
ALT: 20 U/L
AST: 24 U/L
Albumin: 2.8 g/dL — ABNORMAL LOW
Alkaline Phosphatase: 241 U/L — ABNORMAL HIGH
Anion Gap: 12 (ref 7–16)
BILIRUBIN TOTAL: 0.8 mg/dL
BUN: 40 mg/dL — AB
CREATININE: 5.12 mg/dL — AB
Calcium, Total: 8.8 mg/dL — ABNORMAL LOW
Chloride: 85 mmol/L — ABNORMAL LOW
Co2: 35 mmol/L — ABNORMAL HIGH
EGFR (African American): 14 — ABNORMAL LOW
EGFR (Non-African Amer.): 12 — ABNORMAL LOW
Glucose: 418 mg/dL — ABNORMAL HIGH
Potassium: 4.5 mmol/L
Sodium: 132 mmol/L — ABNORMAL LOW
TOTAL PROTEIN: 6.4 g/dL — AB

## 2015-02-18 LAB — LIPASE, BLOOD: Lipase: 19 U/L — ABNORMAL LOW

## 2015-02-20 ENCOUNTER — Inpatient Hospital Stay
Admit: 2015-02-20 | Disposition: A | Discharge status: Skilled Nursing Facility | Payer: Self-pay | Attending: Specialist | Admitting: Specialist

## 2015-02-20 LAB — COMPREHENSIVE METABOLIC PANEL
ALK PHOS: 203 U/L — AB
ALT: 13 U/L — AB
AST: 25 U/L
Albumin: 2.5 g/dL — ABNORMAL LOW
Anion Gap: 9 (ref 7–16)
BUN: 53 mg/dL — ABNORMAL HIGH
Bilirubin,Total: 0.7 mg/dL
Calcium, Total: 7.9 mg/dL — ABNORMAL LOW
Chloride: 91 mmol/L — ABNORMAL LOW
Co2: 34 mmol/L — ABNORMAL HIGH
Creatinine: 6.32 mg/dL — ABNORMAL HIGH
EGFR (African American): 11 — ABNORMAL LOW
GFR CALC NON AF AMER: 10 — AB
Glucose: 113 mg/dL — ABNORMAL HIGH
Potassium: 5.6 mmol/L — ABNORMAL HIGH
Sodium: 134 mmol/L — ABNORMAL LOW
TOTAL PROTEIN: 6.3 g/dL — AB

## 2015-02-20 LAB — BASIC METABOLIC PANEL
Anion Gap: 16 (ref 7–16)
BUN: 56 mg/dL — ABNORMAL HIGH
Calcium, Total: 7.2 mg/dL — ABNORMAL LOW
Chloride: 86 mmol/L — ABNORMAL LOW
Co2: 29 mmol/L
Creatinine: 6.69 mg/dL — ABNORMAL HIGH
EGFR (African American): 10 — ABNORMAL LOW
EGFR (Non-African Amer.): 9 — ABNORMAL LOW
GLUCOSE: 98 mg/dL
Potassium: 4.5 mmol/L
Sodium: 131 mmol/L — ABNORMAL LOW

## 2015-02-20 LAB — URINALYSIS, COMPLETE
BACTERIA: NONE SEEN
Bilirubin,UR: NEGATIVE
Glucose,UR: >=500 mg/dL (ref 0–75)
KETONE: NEGATIVE
Nitrite: NEGATIVE
Ph: 6 (ref 4.5–8.0)
Protein: >=500
SQUAMOUS EPITHELIAL: NONE SEEN
Specific Gravity: 1.013 (ref 1.003–1.030)

## 2015-02-20 LAB — CBC WITH DIFFERENTIAL/PLATELET
BASOS PCT: 1 %
Basophil #: 0.2 10*3/uL — ABNORMAL HIGH (ref 0.0–0.1)
Basophil #: 0.1 10*3/uL (ref 0.0–0.1)
Basophil %: 1.5 %
EOS ABS: 0.2 10*3/uL (ref 0.0–0.7)
Eosinophil #: 0.1 10*3/uL (ref 0.0–0.7)
Eosinophil %: 1.3 %
Eosinophil %: 1.2 %
HCT: 33.8 % — ABNORMAL LOW (ref 40.0–52.0)
HCT: 31.9 % — ABNORMAL LOW (ref 40.0–52.0)
HGB: 10.6 g/dL — AB (ref 13.0–18.0)
HGB: 10 g/dL — ABNORMAL LOW (ref 13.0–18.0)
LYMPHS PCT: 4.8 %
Lymphocyte #: 0.7 10*3/uL — ABNORMAL LOW (ref 1.0–3.6)
Lymphocyte #: 0.6 10*3/uL — ABNORMAL LOW (ref 1.0–3.6)
Lymphocyte %: 6.2 %
MCH: 28.5 pg (ref 26.0–34.0)
MCH: 28.9 pg (ref 26.0–34.0)
MCHC: 31.2 g/dL — AB (ref 32.0–36.0)
MCHC: 31.5 g/dL — AB (ref 32.0–36.0)
MCV: 91 fL (ref 80–100)
MCV: 92 fL (ref 80–100)
Monocyte #: 0.6 x10 3/mm (ref 0.2–1.0)
Monocyte #: 0.5 x10 3/mm (ref 0.2–1.0)
Monocyte %: 4.3 %
Monocyte %: 5 %
NEUTROS PCT: 86.6 %
Neutrophil #: 12 10*3/uL — ABNORMAL HIGH (ref 1.4–6.5)
Neutrophil #: 8.3 10*3/uL — ABNORMAL HIGH (ref 1.4–6.5)
Neutrophil %: 88.1 %
Platelet: 269 10*3/uL (ref 150–440)
Platelet: 163 10*3/uL (ref 150–440)
RBC: 3.71 10*6/uL — ABNORMAL LOW (ref 4.40–5.90)
RBC: 3.48 10*6/uL — ABNORMAL LOW (ref 4.40–5.90)
RDW: 17.8 % — ABNORMAL HIGH (ref 11.5–14.5)
RDW: 18.3 % — ABNORMAL HIGH (ref 11.5–14.5)
WBC: 13.6 10*3/uL — AB (ref 3.8–10.6)
WBC: 9.6 10*3/uL (ref 3.8–10.6)

## 2015-02-20 LAB — TROPONIN I: TROPONIN-I: 0.04 ng/mL — AB

## 2015-02-20 LAB — PHOSPHORUS: Phosphorus: 3.3 mg/dL

## 2015-02-20 LAB — LIPASE, BLOOD: Lipase: 18 U/L — ABNORMAL LOW

## 2015-02-21 DIAGNOSIS — R079 Chest pain, unspecified: Secondary | ICD-10-CM | POA: Diagnosis not present

## 2015-02-21 LAB — TROPONIN I
TROPONIN-I: 0.05 ng/mL — AB
Troponin-I: 0.05 ng/mL — ABNORMAL HIGH
Troponin-I: 0.05 ng/mL — ABNORMAL HIGH

## 2015-02-21 LAB — PHOSPHORUS: Phosphorus: 2.8 mg/dL

## 2015-02-22 LAB — BASIC METABOLIC PANEL
Anion Gap: 15 (ref 7–16)
BUN: 30 mg/dL — AB
CREATININE: 4.43 mg/dL — AB
Calcium, Total: 7.1 mg/dL — ABNORMAL LOW
Chloride: 95 mmol/L — ABNORMAL LOW
Co2: 26 mmol/L
EGFR (Non-African Amer.): 15 — ABNORMAL LOW
GFR CALC AF AMER: 17 — AB
Glucose: 195 mg/dL — ABNORMAL HIGH
Potassium: 4.4 mmol/L
SODIUM: 136 mmol/L

## 2015-02-22 LAB — CBC WITH DIFFERENTIAL/PLATELET
BASOS PCT: 2 %
Basophil #: 0.1 10*3/uL (ref 0.0–0.1)
EOS PCT: 2 %
Eosinophil #: 0.1 10*3/uL (ref 0.0–0.7)
HCT: 29.5 % — AB (ref 40.0–52.0)
HGB: 9.2 g/dL — AB (ref 13.0–18.0)
Lymphocyte #: 0.7 10*3/uL — ABNORMAL LOW (ref 1.0–3.6)
Lymphocyte %: 9.3 %
MCH: 28.9 pg (ref 26.0–34.0)
MCHC: 31.2 g/dL — ABNORMAL LOW (ref 32.0–36.0)
MCV: 93 fL (ref 80–100)
Monocyte #: 0.5 x10 3/mm (ref 0.2–1.0)
Monocyte %: 7.3 %
NEUTROS ABS: 5.7 10*3/uL (ref 1.4–6.5)
Neutrophil %: 79.4 %
Platelet: 152 10*3/uL (ref 150–440)
RBC: 3.18 10*6/uL — AB (ref 4.40–5.90)
RDW: 18.5 % — ABNORMAL HIGH (ref 11.5–14.5)
WBC: 7.1 10*3/uL (ref 3.8–10.6)

## 2015-02-23 LAB — CBC WITH DIFFERENTIAL/PLATELET
Basophil #: 0.1 10*3/uL (ref 0.0–0.1)
Basophil %: 1.1 %
Eosinophil #: 0.2 10*3/uL (ref 0.0–0.7)
Eosinophil %: 2.5 %
HCT: 29.6 % — ABNORMAL LOW (ref 40.0–52.0)
HGB: 9.3 g/dL — ABNORMAL LOW (ref 13.0–18.0)
LYMPHS PCT: 4.5 %
Lymphocyte #: 0.3 10*3/uL — ABNORMAL LOW (ref 1.0–3.6)
MCH: 29.2 pg (ref 26.0–34.0)
MCHC: 31.6 g/dL — AB (ref 32.0–36.0)
MCV: 92 fL (ref 80–100)
MONO ABS: 0.3 x10 3/mm (ref 0.2–1.0)
Monocyte %: 4.2 %
NEUTROS ABS: 6.2 10*3/uL (ref 1.4–6.5)
NEUTROS PCT: 87.7 %
Platelet: 137 10*3/uL — ABNORMAL LOW (ref 150–440)
RBC: 3.2 10*6/uL — ABNORMAL LOW (ref 4.40–5.90)
RDW: 18.1 % — AB (ref 11.5–14.5)
WBC: 7 10*3/uL (ref 3.8–10.6)

## 2015-02-23 LAB — RENAL FUNCTION PANEL
ALBUMIN: 1.9 g/dL — AB
Anion Gap: 8 (ref 7–16)
BUN: 24 mg/dL — AB
CO2: 31 mmol/L
Calcium, Total: 6.7 mg/dL — CL
Chloride: 95 mmol/L — ABNORMAL LOW
Creatinine: 3.62 mg/dL — ABNORMAL HIGH
EGFR (African American): 22 — ABNORMAL LOW
GFR CALC NON AF AMER: 19 — AB
Glucose: 111 mg/dL — ABNORMAL HIGH
PHOSPHORUS: 1.2 mg/dL — AB
Potassium: 3.7 mmol/L
Sodium: 134 mmol/L — ABNORMAL LOW

## 2015-02-23 LAB — VANCOMYCIN, TROUGH: VANCOMYCIN, TROUGH: 16 ug/mL

## 2015-02-24 LAB — CLOSTRIDIUM DIFFICILE(ARMC)

## 2015-02-25 LAB — CBC WITH DIFFERENTIAL/PLATELET
BASOS ABS: 0.1 10*3/uL (ref 0.0–0.1)
BASOS PCT: 1.9 %
EOS PCT: 5.2 %
Eosinophil #: 0.3 10*3/uL (ref 0.0–0.7)
HCT: 29.2 % — AB (ref 40.0–52.0)
HGB: 9.3 g/dL — ABNORMAL LOW (ref 13.0–18.0)
LYMPHS ABS: 0.6 10*3/uL — AB (ref 1.0–3.6)
Lymphocyte %: 11 %
MCH: 29.3 pg (ref 26.0–34.0)
MCHC: 31.7 g/dL — ABNORMAL LOW (ref 32.0–36.0)
MCV: 92 fL (ref 80–100)
MONO ABS: 0.4 x10 3/mm (ref 0.2–1.0)
Monocyte %: 7.5 %
NEUTROS ABS: 4.3 10*3/uL (ref 1.4–6.5)
Neutrophil %: 74.4 %
PLATELETS: 135 10*3/uL — AB (ref 150–440)
RBC: 3.16 10*6/uL — AB (ref 4.40–5.90)
RDW: 18 % — ABNORMAL HIGH (ref 11.5–14.5)
WBC: 5.7 10*3/uL (ref 3.8–10.6)

## 2015-02-25 LAB — BASIC METABOLIC PANEL
Anion Gap: 9 (ref 7–16)
BUN: 24 mg/dL — AB
CREATININE: 4.4 mg/dL — AB
Calcium, Total: 6.8 mg/dL — CL
Chloride: 93 mmol/L — ABNORMAL LOW
Co2: 32 mmol/L
EGFR (African American): 17 — ABNORMAL LOW
EGFR (Non-African Amer.): 15 — ABNORMAL LOW
Glucose: 164 mg/dL — ABNORMAL HIGH
Potassium: 4.4 mmol/L
SODIUM: 134 mmol/L — AB

## 2015-02-25 LAB — CULTURE, BLOOD (SINGLE)

## 2015-02-25 LAB — WOUND CULTURE

## 2015-02-26 LAB — RENAL FUNCTION PANEL
Albumin: 2.3 g/dL — ABNORMAL LOW
Anion Gap: 9 (ref 7–16)
BUN: 28 mg/dL — ABNORMAL HIGH
Calcium, Total: 6.6 mg/dL — CL
Chloride: 91 mmol/L — ABNORMAL LOW
Co2: 29 mmol/L
Creatinine: 5.31 mg/dL — ABNORMAL HIGH
EGFR (African American): 14 — ABNORMAL LOW
GFR CALC NON AF AMER: 12 — AB
Glucose: 263 mg/dL — ABNORMAL HIGH
Phosphorus: 1 mg/dL — CL
Potassium: 5.2 mmol/L — ABNORMAL HIGH
Sodium: 129 mmol/L — ABNORMAL LOW

## 2015-02-27 LAB — RENAL FUNCTION PANEL
Albumin: 2.4 g/dL — ABNORMAL LOW
Anion Gap: 11 (ref 7–16)
BUN: 27 mg/dL — AB
CHLORIDE: 92 mmol/L — AB
CO2: 30 mmol/L
Calcium, Total: 6.7 mg/dL — CL
Creatinine: 4.94 mg/dL — ABNORMAL HIGH
EGFR (African American): 15 — ABNORMAL LOW
GFR CALC NON AF AMER: 13 — AB
Glucose: 318 mg/dL — ABNORMAL HIGH
Phosphorus: 1.3 mg/dL — ABNORMAL LOW
Potassium: 5.2 mmol/L — ABNORMAL HIGH
SODIUM: 133 mmol/L — AB

## 2015-02-27 LAB — HEMOGLOBIN A1C: HEMOGLOBIN A1C: 9.7 % — AB

## 2015-02-27 LAB — PLATELET COUNT: Platelet: 116 10*3/uL — ABNORMAL LOW (ref 150–440)

## 2015-02-28 LAB — HEMOGLOBIN: HGB: 9.7 g/dL — AB (ref 13.0–18.0)

## 2015-02-28 LAB — POTASSIUM: POTASSIUM: 5.3 mmol/L — AB

## 2015-02-28 LAB — PHOSPHORUS: PHOSPHORUS: 1.2 mg/dL — AB

## 2015-03-02 NOTE — Discharge Summary (Signed)
PATIENT NAME:  Taylor [name], Taylor [name] MR#:  [id number] DATE OF BIRTH:  09-Jul-1971  DATE OF ADMISSION:  03/17/2014 DATE OF DISCHARGE:  03/20/2014  For a detailed note, please look at the history and physical done on admission by Dr. Angelica Ran.   DIAGNOSES AT DISCHARGE: As follows: Nonketotic hyperglycemic state, end-stage renal disease on hemodialysis, hypertension, cellulitis of the right fifth finger, hypothyroidism, hyperlipidemia, history of congestive heart failure.  The patient is being discharged on a low-sodium, low-fat, carb-controlled renal diet.   ACTIVITY: As tolerated.  FOLLOWUP: With his primary care physician in the next 1 to 2 weeks.    DISCHARGE MEDICATIONS: Crestor 20 mg at bedtime, Lyrica 150 mg b.i.d., Synthroid 75 mcg daily, aspirin 81 mg daily, Coreg 25 mg b.i.d., Imdur 30 mg b.i.d., Lantus 60 units at bedtime, Lasix 80 mg b.i.d., Novolin regular 12 units t.i.d. with meals, omeprazole 40 mg daily, Sensipar 60 mg daily, omega-3 supplements 1 tab at bedtime, clindamycin 300 mg q.8 hours x 5 days.   CONSULTANTS DURING THE HOSPITAL COURSE:  Dr. Mady Haagensen from nephrology.   PERTINENT STUDIES DONE DURING THE HOSPITAL COURSE:  Are as follows: An x-ray of the right fifth finger done showing no osseous abnormality.   HOSPITAL COURSE: This is a 44 year old male with medical problems as mentioned above, presented to the hospital on 03/17/2014 due to severe hyperglycemia and a nonketotic hyperglycemic state.  1.  Nonketotic hyperglycemia. The exact etiology of this was unclear. The patient says he was compliant with his diabetic medications but his hemoglobin A1c was noted to be as high as 12. The patient was initially admitted to the Intensive Care Unit, started on a non-diabetic ketoacidosis protocol insulin drip. He was maintained on that for about 24 hours or so, eventually weaned off of it and has been placed back on his Lantus and NovoLog with meals and his sugars have  improved. There was some concern that his hypoglycemia could be related to an infectious source given his right fifth finger cellulitis, although that was unlikely. The patient is currently tolerating oral well and his blood sugars are stable and he is being discharged home. 2.  End-stage renal disease on hemodialysis. The patient normally gets dialysis on Tuesday, Thursday, Saturday. He was noted to be volume overloaded as he got aggressive fluids for his hyperglycemia. He got extra dialysis on May 11, tolerated it well. His volume status has improved. He will continue his dialysis on Tuesday, Thursday, Saturday. 3.  Right fifth digit ulcer/cellulitis. The patient was maintained on some oral clindamycin. He is being discharged on a few more days of clindamycin as stated.  4.  Hypothyroidism. The patient was maintained on his Synthroid. He will resume that. 5.  Hypertension. The patient remained hemodynamically stable. He will continue his Imdur and Coreg.  6.  Diabetic neuropathy. The patient was maintained on his Lyrica and he will also resume that upon discharge.   The patient is a FULL CODE.  TIME SPENT:  40 minutes.   ____________________________ Rolly Pancake. Cherlynn Kaiser, MD vjs:ce D: 03/20/2014 17:10:25 ET T: 03/20/2014 18:15:01 ET JOB#: [id number]  cc: Rolly Pancake. Cherlynn Kaiser, MD, <Dictator> Primary care physician Houston Siren MD ELECTRONICALLY SIGNED 03/21/2014 14:07

## 2015-03-02 NOTE — Consult Note (Signed)
PATIENT NAME:  Taylor [name], Taylor [name] MR#:  [id number] DATE OF BIRTH:  [birth year]  DATE OF CONSULTATION:  09/13/2014  REFERRING PHYSICIAN:   CONSULTING PHYSICIAN:  Audery Amel, MD  IDENTIFYING INFORMATION AND REASON FOR CONSULTATION:  This is a 44 year old man with a history of diabetes, end-stage renal disease, and coronary artery disease, and other complications, who is in the hospital for problems with his blood sugar. Consult for anxiety and depression.   HISTORY OF PRESENT ILLNESS: Information obtained from the patient and the chart. The patient tells me that his biggest complaint with anxiety and depression is that he occasionally gets panic attacks when he feels like he has too much fluid on him. He says usually those are pretty rare, but this last week or 2 they have been happening more frequently and he has had several of them. He will get a feeling like he cannot draw his breath and then find himself getting panicky. He says that he can usually deal with it by taking slow deep breath and talking to someone and distracting himself. Otherwise he says in between his mood is feeling okay, he does not feel excessively anxious or worried. He says his mood has been fairly good and he does not have an awareness of feeling depressed. His sleep is okay. Appetite is good. He seems to have an appreciation of contact with his family although he does not have a whole lot of social activity outside of that. He denies any suicidal ideation. Denies any psychotic symptoms. It appears that he was not taking any psychiatric medicine on admission. He tells me that his doctor had given him something to take at home, but looking through his medicines I am not sure what that would be. He says it was something that started with an H. I note that Dr. Sherryll Burger started the patient on paroxetine 20 mg a day yesterday and he has also been getting a small dose of lorazepam as needed for anxiety.  Chief stress is his medical  problem.   PAST PSYCHIATRIC HISTORY: No history of psychiatric hospitalization, denies having seen an actual psychiatric provider in the past. No history of suicide attempts. He says he has gotten some treatment for anxiety from his primary care doctor, but that is it. Does not recall the names of any other medicines he has been on other than probably clonazepam and Ativan at times.   FAMILY HISTORY: Knows of no family history.   SOCIAL HISTORY: The patient is twice divorced. No children. Not able to work. Lives with his parents and they are his closest relationship.   PAST MEDICAL HISTORY: End-stage renal failure on dialysis, severe diabetes with frequently very elevated blood sugars, history of coronary artery disease, history of gangrene and amputations.   SUBSTANCE ABUSE HISTORY: Denies any alcohol or drug abuse. Denies any past history of substance abuse. I do not see anything in his old chart just from what I have scanned through that would suggest that there were concerns about substance abuse in the past. He does not seem to be a regular consumer of a lot of narcotics and I also do not see a lot of benzodiazepines used regularly.   CURRENT MEDICATIONS: Norco 5-10 mg every 4 hours as needed for moderate pain, aspirin 81 mg a day, Corey 25 mg twice a day, Sensipar 60 mg once a day, clonidine 0.1 mg twice a day, Lasix 80 mg twice a day, isosorbide extended release 30 mg twice a day, Synthroid  75 mcg a day, pantoprazole 40 mg a day, Lyrica 150 mg twice a day, Crestor 20 mg at night, insulin currently on a sliding scale, nystatin powder topically, metolazone 5 mg daily.   ALLERGIES: AUGMENTIN AND RIFAMPIN.     REVIEW OF SYSTEMS:  Complains of intermittent anxiety attacks. At that time we interviewed him he was not having one, did not have any chief complaint. Denies feeling particularly depressed. Denies suicidal ideation. Denies any psychotic symptoms. Does get these anxiety attacks mostly he  thinks when he is fluid overloaded.   MENTAL STATUS EXAMINATION: Chronically ill-appearing gentleman who was cooperative but passive during the interview. Eye contact adequate. Psychomotor activity sluggish. Speech normal in tone, did not elaborate very much, but neither was he mute. Affect was flat, mood was stated as being all right. Thoughts were generally lucid. No sign of delusional thinking or loosening of associations. Denied auditory or visual hallucinations. Denied suicidal or homicidal ideation. Could recall 3 out of 3 objects immediately, 2 out of 3 at 3 minutes. He was alert and oriented completely. Judgment and insight appear to be adequate. Baseline intelligence appears normal.   LABORATORY RESULTS: A great many laboratories drawn this hospital stay, probably most of them not really relevant to the current issue at hand. No use for me to repeat all of his abnormal laboratory values at this point. He is chronically anemic with a hemoglobin of 9 and a hematocrit of 29.2. Glucoses continue to run very high. Creatinine runs over 3. Sodium is low at 128.   ASSESSMENT: This is a 44 year old man with multiple severe chronic illnesses, all of the sort that can lend themselves to increasing a person's chronic anxiety. Looking back over his past history it is actually surprising to me that he has not had more concerns about mental health issues over the years. Right now he does not appear to have a major depression, but he is reporting anxiety attacks. He thinks they are all related to being fluid overloaded and while it is possible that may play some role to it, it may be that he is just having anxiety attacks and then finding an explanation for them. I note that the hospitalist had started him on 20 mg of Paxil a day as well as a low dose of Ativan a day. I think that is a very reasonable treatment plan. He does not appear to have a history of substance abuse. I emphasized to him the importance of not  overusing benzodiazepines at the risk of getting dependent on them.   TREATMENT PLAN: I am not making any other adjustments to his medicines right now. I will follow up as needed. Given the amount of medical treatment that he is getting so far I am not sure that adding a requirement for outpatient psychiatric treatment would help since he already seems to be doing a good job soothing himself, but a little bit of educational counseling was completed.   DIAGNOSIS PRINCIPAL AND PRIMARY:   AXIS I: Panic attacks.   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   AXIS II: Deferred.   AXIS III: Diabetes, renal failure, coronary artery disease.     ____________________________ Audery Amel, MD jtc:bu D: 09/13/2014 17:36:55 ET T: 09/13/2014 17:51:57 ET JOB#: [id number]  cc: Audery Amel, MD, <Dictator> Audery Amel MD ELECTRONICALLY SIGNED 09/15/2014 14:59

## 2015-03-02 NOTE — Discharge Summary (Signed)
PATIENT NAME:  Taylor [name], Taylor [name] MR#:  [id number] DATE OF BIRTH:  1971-01-23  DATE OF ADMISSION:  09/07/2014 DATE OF DISCHARGE:  09/19/2014  NOTE:  There is an interim discharge summary by Dr. Delfino Lovett on 09/13/2014, covering the admission period between October 30 and November 5. I rounded on this patient from November 6 to November 11.   ADMITTING COMPLAINT:  Elevated blood sugars.   DISCHARGE DIAGNOSES: 1.  Diabetes mellitus, insulin sensitive with labile blood sugars, hemoglobin A1c 13.3.  2.  End-stage renal disease on hemodialysis Tuesdays, Thursdays, and Saturdays.  3.  Massive volume overload with greater than 25 liters removed by hemodialysis over the past week.  4.  Scrotal surgical wound dehiscence after penile pump explant.  5.  Coronary artery disease.  6.  Pulmonary hypertension and diastolic dysfunction due to left ventricular hypertrophy.  7.  Hypertension.  8.  Hyperlipidemia.  9.  Generalized weakness.   CONSULTATIONS:  1.  Dr. Julien Nordmann, cardiology.  2.  Dr. Mosetta Pigeon, nephrology.  3.  Dr. Rollene Rotunda, cardiology.  4.  Dr. Lamont Dowdy, nephrology.   PROCEDURES:  No new procedures since November 5. The patient has continued with hemodialysis.   HISTORY OF PRESENT ILLNESS:  This 44 year old Caucasian male presented on October 30 with concern about elevated blood sugars. He has a history of diabetes, end-stage renal disease, and coronary artery disease. He reported that he had been taking insulin as prescribed. He had had a penile implant several weeks prior to presentation, which had become infected and had to be removed. He was noted to be slightly short of breath at time of presentation. He had purulent-seeming discharge from his surgical wound. He was admitted for blood sugar stabilization.   HOSPITAL COURSE BY PROBLEM:  1.  Diabetes mellitus type I with very labile blood sugars. Hemoglobin A1c is 11.3. His blood sugars have been very difficult  to manage in the hospital and have ranged from a low of 20 to highs in the 400s. Over the 48 hours prior to discharge, blood sugars have remained fairly consistently between 70 and 332 with most readings being in the 100 range. I have decreased his home dose of Lantus from 35 units daily to 15 units daily, and this has been discussed with the patient. He will follow up with his primary endocrinologist, Dr. Elvera Lennox, in [place] within one week of discharge. He does have a glucometer with test strips and is able to test his blood sugars at home. He also has short-acting insulin for sliding scale.  2.  End-stage renal disease on Tuesday, Thursday, Saturday dialysis. He is being discharged on a Wednesday. He had hemodialysis today and will resume his outpatient Tuesday, Thursday, Saturday schedule upon discharge.  3.  Massive volume overload, anasarca with removal of greater than 25 liters during the past week through hemodialysis. The patient is still slightly edematous in the lower extremities. He is no longer hypoxic, breathing easily with oxygen saturations in the 90s on room air. He will continue to have volume removed in outpatient hemodialysis.  4.  Surgical wound dehiscence on the scrotum. Surgical wound is healing well after removal of fluid and decrease in scrotal edema. Wound culture revealed rare Candida and has been treated with nystatin. Blood cultures have been negative. The patient is afebrile with no leukocytosis. Recent wound culture showed rare very low numbers of gram-negative rods. Recent wound exam shows no signs of active infection.  5.  Coronary artery disease. The  patient had no chest pain throughout his admission. He was followed by cardiology. He will continue on Coreg, Crestor, and aspirin. Coronary artery disease is stable.  6.  Pulmonary hypertension and diastolic dysfunction due to left ventricular hypertrophy, followed by cardiology. Metolazone and Lasix will be continued. The  patient does make urine. He will continue to need outpatient cardiology followup.  7.  Hypertension, controlled. No changes.  8.  Hyperlipidemia. Continue statin.  9.  Generalized weakness. The patient has been working with physical therapy while inpatient and has been offered home health physical therapy upon discharge. He declines home health physical therapy.  10.  Disposition. The patient is to be discharged to home. He has been offered home health physical therapy, nursing, and nurse's aide, but has declined any in-home care services.   DISCHARGE PHYSICAL EXAMINATION: VITAL SIGNS:  Temperature 97.8, pulse 90, respirations 20, blood pressure 150/73, oxygenation 97% on room air.  GENERAL:  No acute distress, resting comfortably in the chair.  PULMONARY:  Lungs are clear to auscultation bilaterally with good air movement.  CARDIOVASCULAR:  Regular rate and rhythm. There is a 4/6 systolic ejection murmur, which is heard throughout the precordium. There is 1+ peripheral edema. Peripheral pulses are 1+.  ABDOMEN:  Soft, nontender, nondistended. Bowel sounds are normal.  PSYCHIATRIC:  The patient is alert and oriented and has good insight into his clinical conditions.   LABORATORY DATA:  It is noted that these are prior to hemodialysis:  Sodium 130, potassium 5.4, chloride 91, bicarbonate 31, BUN 36, creatinine 4.03. Again, this is prior to hemodialysis. White blood cell count is 5.7, hemoglobin 8.4, platelets 116,000, and MCV is 94.   DISCHARGE MEDICATIONS: 1.  Crestor 20 mg 1 tablet daily.  2.  Lyrica 150 mg 1 capsule twice a day.  3.  Levothyroxine 75 mcg 1 tablet once a day.  4.  Aspirin 81 mg 1 tablet once a day.  5.  Carvedilol 25 mg 1 tablet twice a day.  6.  Isosorbide mononitrate 1 tablet twice a day.  7.  Furosemide 80 mg 1 tablet twice a day.  8.  Omega-3 polyunsaturated fatty acid 1 capsule once a day.  9.  Omeprazole 40 mg 1 capsule daily.  10.  Sensipar 60 mg 1 tablet once a  day.  11.  Clonidine 0.1 mg 1 tablet twice a day.  12.  Humalog 100 units/mL subcutaneous solution per sliding scale.  13.  Lantus injectable 15 units once a day.  14.  Nystatin 100,000 units/g topical cream apply to affected area every 8 hours x 14 days.  15.  Paroxetine 20 mg 1 tablet daily.  16.  Metolazone 5 mg 1 tablet daily.   CONDITION ON DISCHARGE:  Fair.  DISPOSITION:  The patient is discharged to home. It is noted that he has declined home health physical therapy, nursing, and nursing aide services. He states that his father is healthy and able to help him with activities of daily living. He states that he does have transportation to and from hemodialysis. He does not wish for any additional services.   DISCHARGE INSTRUCTIONS: DIET:  Renal, heart healthy.  ACTIVITY:  As tolerated.  TIMEFRAME FOR FOLLOWUP APPOINTMENT:  Appointments have been made for this patient with Dr. Elvera Lennox on Friday, 09/28/2014, and with Dr. Kirke Corin Monday, 10/08/2014, and he will continue with hemodialysis on Tuesdays, Thursdays, and Saturdays.   TIME SPENT ON DISCHARGE: 45 minutes.    ____________________________ Ena Dawley. Clent Ridges, MD cpw:nb D:  09/19/2014 16:14:58 ET T: 09/19/2014 20:15:49 ET JOB#: [id number]  cc: Santina Evans P. Clent Ridges, MD, <Dictator> Gale Journey MD ELECTRONICALLY SIGNED 09/21/2014 11:51

## 2015-03-02 NOTE — Consult Note (Signed)
Brief Consult Note: Comments: Psychiatry: Consult received. Chart reviewed. Patient with diabetes and renal failure and multiple complications no ideantified psych history. Consult for anxiety and depression. Will complete full consult and note tomorrow.  Electronic Signatures: Audery Amel (MD)  (Signed [phone] 22:09)  Authored: Brief Consult Note   Last Updated: 04-Nov-15 22:09 by Audery Amel (MD)

## 2015-03-02 NOTE — Consult Note (Signed)
General Aspect Primary Cardiologist: Dr. Kirke Corin, MD ________________  44 year old male with history of CAD s/p 3 vessel CABG in 2010, ESRD on HD, type I DM, PAD, HTN, HLD, hypothyroidism, GERD, and anxiety presented to [facility] on 09/07/2014 with hyperglycemia over the past 1 month. He reports medication compliance. While he has been admitted he has unfortunately been required to undergo daily HD treatments 2/2 volume overload. He is -15 L for the admission. Cardiology is consulted for further evaluation.  ________________  Past Medical History?? Diagnosis?? Date?? ????? End stage renal disease on dialysis?? ?? ?? ?? LUE fistula?? ????? Type I diabetes mellitus?? ?? ?? ?? a. 03/2014 admitted with HNK to [facility].?? ????? Diabetic neuropathy?? ?? ?? ?? severe, s/p multiple toe amputation?? ????? Hypothyroidism?? ?? ????? Hypertension?? ?? ????? Hyperlipidemia?? ?? ????? Heart murmur?? ?? ????? H/O hiatal hernia?? ?? ????? GERD (gastroesophageal reflux disease)?? ?? ????? Anxiety?? ?? ????? Sebaceous cyst?? ?? ?? ?? side of neck?? ????? Pneumonia?? ?? ?? ?? 2010?? ????? Anemia?? ?? ?? ?? a. req PRBC's 2011.?? ????? PAD (peripheral artery disease)?? ?? ?? ?? a. s/p amputation of toes on the right;?? b. left LE claudication.?? ????? Coronary artery disease?? ?? ?? ?? a. s/p MI;?? b. 10/2009 CABG x 3 @ [facility]: LIMA->LAD, VG->OM3, VG->RPDA; c. 11/2010 Cath 3/3 patent grafts;?? d 12/2012 Cath: LM 30d, LAD 85p, D1 70, D2 90, LCX 40ost, OM2 100, RCA 90p, 164m, L->LAD ok, VG->OM3 ok, VG->RPDA 30, EF 50%->Med Rx.?? ????? Cataract?? ?? ?? ?? right?? ????? Valvular disease?? ?? ?? ?? a. 11/2012 Echo: EF 55-60%, mild LVH, mild MR, mild bi-atrial enlargement, mild-mod TR, PASP .??  Past Surgical History?? Procedure?? Laterality?? Date?? ????? Dialysis fistula creation?? ?? ?? ?? ?? left upper arm fistula?? ????? Amputation?? ?? ?? ?? ?? TOES ON BOTH FEET?? ????? Abscess drainage?? ?? Behind right  ear/occipital scalp?? ????? Skin graft?? ?? ?? ????? Eye surgery?? ?? 1999?? ????? Coronary artery bypass graft?? ?? 10/2009?? ?? ?? [facility] (Dr. Katrinka Blazing)?? ????? Foot amputation?? ?? ?? ????? Cardiac catheterization?? ?? ?? ????? Cardiac catheterization?? ?? 2/14?? ?? ?? [facility]: severe 3 vessel CAD with patent grafts, RHC: moderately elevated PCW and pulmonary hypertension??  _____________________   Present Illness 44 year old male with the above complex problem list presented to [facility] for 1 month hyperglycemia.  Patient with a known history of CAD s/p 3 vessel CABG in 2010 (LIMA-LAD, VG-OM3, VG-RPDA). In 11/2010 he underwent cath that showed 3/3 patent grafts. In 12/2012 he underwent another cath that showed LM 30d, LAD 85p, D1 70, D2 90, LCX 40ost, OM2 100, RCA 90p, 151m, L->LAD ok, VG->OM3 ok, VG->RPDA 30, EF 50%. Medical Rx was recommeded. Echo was done 11/2012 that showed an EF 55-60%, mild LVH, mild MR, mild biatrial enlargement, mild-moderate TR, PASP of 49 mm Hg. He was last seen in the office on 03/28/2014 with complaints of chronic dyspnea. This was associated with his weights inbetween his HD sessions running higher. There was no associated chest pain. A repeat echo was ordered 03/2014 that showed an EF 50-55%, no regional WMA, select images concerning for bicuspid aotic valve, mild MR, mildly dialted LA/RA, moderate TR, PASP was at least moderately elevated at 52 mm Hg with a long axis estimate at 85 mm Hg. It was advised that he could potentially have a cath to further evaluate that and for him to schedule a follow up - he never did.   He comes into [facility] on 10/30 with the above hyperglycemia x  1 month. He recently had a penile implant but had to get this removed 2/2 infection on 10/29. Sugars at [facility] upon presentation were noted to be in the 800s. The patient otherwise complains of being very thirsty, but denies any chest pains or palpitations. He does have some shortness of breath. He complains of  asymmetrical swelling of his lower extremities, right greater than left. He also has been having some drainage from the penis, but he states that has been going on and off for the past few weeks. He denies any other complaints, including abdominal pain, nausea, vomiting or diarrhea. He does make urine and denies any urinary frequency, urgency or hesitancy.  He notes over the past 2 weeks positive orthopnea, increased LEE, DOE, & early satiety. He denies any angina.    Since his admission he has continued to note SOB and abdominal distention. He has been diuresed 15 L through HD since his admission. His blood sugars have been quite labile (800 upon admission to a low of 50). HGB A1C 11.3% upon admission. Echo is pending.   Physical Exam:  GEN well developed, well nourished, no acute distress   HEENT pale conjunctivae, PERRL, hearing intact to voice   NECK supple   RESP normal resp effort  crackles   CARD Regular rate and rhythm  No murmur   ABD denies tenderness  no hernia  distended   EXTR bilateral edema   SKIN normal to palpation   NEURO cranial nerves intact   PSYCH alert, A+O to time, place, person, good insight   Review of Systems:  General: Fatigue  Weakness   Skin: No Complaints   ENT: No Complaints   Eyes: No Complaints   Neck: No Complaints   Respiratory: Short of breath  Wheezing   Cardiovascular: Dyspnea   Gastrointestinal: No Complaints   Genitourinary: polyuria   Vascular: No Complaints   Musculoskeletal: No Complaints   Neurologic: No Complaints   Hematologic: No Complaints   Endocrine: Excessive thirst or urination   Psychiatric: No Complaints   Review of Systems: All other systems were reviewed and found to be negative   Medications/Allergies Reviewed Medications/Allergies reviewed   Family & Social History:  Family and Social History:  Family History Hypertension  Diabetes Mellitus   Social History negative tobacco, negative ETOH,  negative Illicit drugs   Place of Living Home     Ejection Fracture 35%:    Gastritis:    Hiatal Hernia:    Anemia:    HTN:    Kidney Failure:    CHF:    Hypothyroidism:    Diabetes Mellitus,Type I (IDD):    Hernia:    Depression:    Coronary Artery Bypass Graft: 3 CABG 2010, Dec 2010   Left Brachial Basilic Fistula:    Toe Amputations:    Skin grafts - feet:   Home Medications: Medication Instructions Status  Lantus 100 units/mL subcutaneous solution 20 unit(s) subcutaneous 2 times a day  for 30 days Active  nystatin 100,000 units/g topical cream Apply topically to affected area every 8 hours x 14 days Active  HumaLOG 100 units/mL subcutaneous solution 15 unit(s) subcutaneous 3 times a day (with meals) Active  cloNIDine 0.1 mg oral tablet 1 tab(s) orally 2 times a day Active  Crestor 20 mg oral tablet 1 tab(s) orally once a day (at bedtime) Active  Lyrica 150 mg oral capsule 1 cap(s) orally 2 times a day Active  levothyroxine 75 mcg (0.075 mg) oral tablet 1  tab(s) orally once a day Active  Aspirin Enteric Coated 81 mg oral delayed release tablet 1 tab(s) orally once a day (in the morning) Active  carvedilol 25 mg oral tablet 1 tab(s) orally 2 times a day Active  isosorbide mononitrate 30 mg oral tablet, extended release 1 tab(s) orally 2 times a day Active  furosemide 80 mg oral tablet 1 tab(s) orally 2 times a day Active  omega-3 polyunsaturated fatty acids 1000 mg oral capsule 1 cap(s) orally once a day (at bedtime) Active  omeprazole 40 mg oral delayed release capsule 1 cap(s) orally once a day Active  Sensipar 60 mg oral tablet 1 tab(s) orally once a day (at bedtime) Active   Lab Results:  Thyroid:  31-Oct-15 03:57   Thyroxine, Free 0.90 (Result(s) reported on 08 Sep 2014 at 07:41AM.)  Thyroid Stimulating Hormone  6.98 (0.45-4.50 (IU = International Unit)  ----------------------- Pregnant patients have  different reference  ranges for TSH:  - - - - -  - - - - -  Pregnant, first trimetser:  0.36 - 2.50 uIU/mL)  Hepatic:  04-Nov-15 13:51   Albumin, Serum  2.0 (Result(s) reported on 12 Sep 2014 at 05:24PM.)  Routine Chem:  04-Nov-15 13:51   Glucose, Serum  414  BUN  30  Creatinine (comp)  3.25  Sodium, Serum  128  Potassium, Serum 4.7  Chloride, Serum  89  CO2, Serum 28  Calcium (Total), Serum  7.1  Phosphorus, Serum  2.4   EKG:  EKG Interp. by me   Interpretation NSR, 84, RBBB, TWI V2-V3 (not new since 08/22/2014)   Radiology Results: [name]:    04-Nov-15 10:08, US Abdomen Limited Survey  US Abdomen Limited Survey   REASON FOR EXAM:    ascites?  COMMENTS:       PROCEDURE: [name]  - US ABDOMEN LIMITED SURVEY  - Sep 12 2014 10:08AM     CLINICAL DATA:  Evaluate for ascites.    EXAM:  LIMITED ABDOMEN ULTRASOUND FOR ASCITES    TECHNIQUE:  Limited ultrasound survey for ascites was performed in all four  abdominal quadrants.    COMPARISON:  CT 04/15/2011.  FINDINGS:  Evidence of mild ascites most prominent over the right upper  quadrant in the perihepatic region. Limited visualization of the  liver demonstrates mild increased echogenicity and slight nodular  contour.     IMPRESSION:  Mild ascites most prominent over the right upper  quadrant/perihepatic region.    Limited visualization of the liver with findings suggestive  steatosis and possible early cirrhosis.      Electronically Signed    By: Elberta Fortis M.D.    On: 09/12/2014 10:48         Verified By: Elba Barman, M.D.,    Augmentin: N/V/Diarrhea, Other  Rifampin: N/V/Diarrhea  Vital Signs/Nurse's Notes: **Vital Signs.:   05-Nov-15 08:02  Vital Signs Type Routine  Temperature Temperature (F) 97.8  Celsius 36.5  Temperature Source oral  Pulse Pulse 82  Respirations Respirations 18  Systolic BP Systolic BP 131  Diastolic BP (mmHg) Diastolic BP (mmHg) 72  Mean BP 91  Pulse Ox % Pulse Ox % 100  Pulse Ox Activity Level  At rest  Oxygen  Delivery 2L    Impression 44 year old male with history of CAD s/p 3 vessel CABG in 2010, ESRD on HD, type I DM, PAD, HTN, HLD, hypothyroidism, GERD, and anxiety presented to [facility] on 09/07/2014 with hyperglycemia over the past 1 month. He  reports medication compliance. While he has been admitted he has unfortunately been required to undergo daily HD treatments 2/2 volume overload. He is -15 L for the admission. Cardiology is consulted for further evaluation.   1. Acute CHF: -Patient was seen in dialysis -Has been requiring daily HD, -15 L this admission -Echo is pending -He does still make urine -Currently on Lasix 80 mg bid  -Add metolazone 5 mg to AM dose -Pending echo results may need further ischemic evaluation  2. CAD s/p 3 vessel CABG 2010: -Last nuc 11/2012 which was normal which was followed by a cath 12/2012 in the setting of persistent symptoms which showed 3/3 patent grafts, med Rx recommeded.  -Continue Coreg, aspirin, Crestor  3. Labile blood sugars: -In the setting of recent infection and acute CHF -Endocrine has been consulted  4. ESRD on HD: -Requiring HD daily 2/2 volume overload -minus 15 L on admission, currently in HD   5. HTN: -Continue current meds  6. HLD: -Engineer, technical sales for Addendum Section:  Lorine Bears (MD) (Signed Addendum [phone] 09:06)  The patient was seen and examined. Agree with the above. Still with significant dyspena and abdominal swelling. Postive JVD. echo with low normal LVSF and significant diastolic dysfunction. There is still severe pulmonary hypertension with RV enlargement and decreased RV function. He still seems to be significantly fluid overloaded. Continue fluid removal and BP control. A right heart cath can be considered if determining volume status is needed. However, he still seems to be fluid overaloded now.   Electronic Signatures: Lorine Bears (MD)  (Signed [phone] 09:06)  Co-Signer: General  Aspect/Present Illness, Family & Social History, Past Medical History, Home Medications, Labs, EKG , Radiology, Allergies, Vital Signs/Nurse's Notes Sondra Barges (PA-C)  (Signed 05-Nov-15 12:41)  Authored: General Aspect/Present Illness, History and Physical Exam, Review of System, Family & Social History, Past Medical History, Home Medications, Labs, EKG , Radiology, Allergies, Vital Signs/Nurse's Notes, Impression/Plan   Last Updated: 06-Nov-15 09:06 by Lorine Bears (MD)

## 2015-03-02 NOTE — Discharge Summary (Signed)
PATIENT NAME:  Taylor [name], Taylor [name] MR#:  [id number] DATE OF BIRTH:  1971/04/07  DATE OF ADMISSION:  09/07/2014 DATE OF DISCHARGE:  09/21/2014  HOSPITAL COURSE: For a detailed note, please take a look at the history and physical done on admission by Dr. Auburn Bilberry. Please take a look at the detailed interim discharge summary done by Dr. Delfino Lovett which covers hospital course from October 31st, until November 5th, please also look at the discharge summary done by Dr. Elby Showers, which covers hospital course from November 5th, until November 11th, this was just a short interim course from his previous.   DIAGNOSES AT DISCHARGE:  Are as follows diabetes mellitus, end-stage renal disease on hemodialysis, volume overload, status post greater than 25 years removed by hemodialysis, scrotal surgical wound dehiscence after penile pump, coronary artery disease, pulmonary hypertension with diastolic dysfunction; hypertension, hyperlipidemia, generalized weakness.   CONSULTANTS: During the hospital course, Antonieta Iba, MD, from cardiology, Dr. Mosetta Pigeon, nephrology, Dr. Mady Haagensen from nephrology.   PERTINENT STUDIES DONE DURING THE INTERIM HOSPITAL COURSE: None.   MEDICATIONS: Upon discharge are as follows, Crestor 20 mg daily, Lyrica 150 mg b.i.d., Synthroid 75 mcg daily, aspirin 81 mg daily, Coreg 25 mg b.i.d., Imdur 30 mg daily, Lasix 80 mg b.i.d. omega-3 fatty acid 1 tab at bedtime; omeprazole 40 mg daily, Sensipar 60 mg at bedtime, clonidine 0.1 mg b.i.d., Humalog per sliding scale, Lantus 15 units at bedtime, nystatin topical cream to be applied every 8 hours to the affected area, Paxil 20 mg daily and metolazone 5 mg daily.   The patient is being discharged on a low-sodium, low-fat; American Diabetic Association renal diet.   ACTIVITY: As tolerated.   FOLLOWUP: With  Dr. Elvera Lennox in [place] from endocrinology and also follow up with patient's cardiologist in the next 1 to 2  weeks; also follow up with Dr. [name], patient's primary care physician in the next 1 week.   BRIEF INTERIM HOSPITAL COURSE: Is as follows, this is a 44 year old male with medical problems as mentioned above who presented to the hospital originally on 09/05/2014, due to nonketotic hyperglycemic state and uncontrolled hyperglycemia, also developed a significant volume overload.  Problem:  1. Volume overload. This was secondary to aggressive fluid resuscitation from his hyperglycemic, nonketotic state. The patient apparently was significantly volume overloaded, had about 25 to 30 liters removed while in the hospital; clinically does not appear to be in congestive heart failure. He has been dialyzed multiple times while in the hospital, will resume his schedule on Tuesdays, Thursdays, and Saturdays.  2. Brittle diabetes with evidence of hypo or hyperglycemia. The patient apparently was maintained on his low-dose Levemir and sliding scale insulin which he will continue. He does have a close follow-up with endocrine coming up. His hemoglobin A1c was 11, which is secondary to noncompliance, the patient did receive diabetic education while in the hospital.  3.  End-stage renal disease on hemodialysis. As mentioned patient did get dialysis multiple times to get extra fluid removed. He will continue his scheduled on Tuesday, Thursday, Saturday.  4.  Scrotal wound dehiscence with infected ulcerations. The patient's wound cultures grew Candida. Blood cultures remained negative. He will continue nystatin powder and cream as mentioned. This has clinically improved since admission.  5.  History of coronary artery disease. The patient had no acute chest pains. His blood pressure is currently stable; he is hemodynamically stable, therefore being discharged on aspirin, Coreg, Crestor.  6.  Pulmonary hypertension with  diastolic dysfunction. The patient will continue his metolazone and Lasix.  7.  Hyperlipidemia. The  patient was maintained on his Crestor, he will resume that.  8.  Hypothyroidism. The patient was maintained on his Synthroid, he will resume that.  9.  Anxiety. The patient was maintained on his Paxil, and he will continue that along with his as needed Ativan.   The patient is a full code.   He is being discharged home; he has refused home health services.   TIME SPENT ON DISCHARGE: Was 40 minutes.    ____________________________ Rolly Pancake. Cherlynn Kaiser, MD vjs:nt D: 09/21/2014 15:07:51 ET T: 09/21/2014 19:25:46 ET JOB#: [id number]  cc: Rolly Pancake. Cherlynn Kaiser, MD, <Dictator> Bryn Gulling. [name], MD Houston Siren MD ELECTRONICALLY SIGNED 09/28/2014 12:47

## 2015-03-02 NOTE — H&P (Signed)
PATIENT NAME:  Taylor [name], Taylor [name] MR#:  [id number] DATE OF BIRTH:  November 03, 1971  DATE OF ADMISSION:  09/07/2014  PRIMARY CARE PROVIDER:  Dr.    EMERGENCY DEPARTMENT REFERRING PHYSICIAN: Dr. Derrill Kay   CHIEF COMPLAINT: Elevated blood sugar.   HISTORY OF PRESENT ILLNESS: The patient is a 44 year old white male with history of diabetes, history of end-stage renal disease and coronary artery disease, who reports that his blood sugars have been very high over the past 1 month. He reports that he has been taking his insulin as he is supposed to take; however, he had a penile implant that apparently had infection and had to be removed. The patient actually went to see the surgeon that put the penile implant yesterday, and he saw the PA there and they stated that everything was okay, but today his blood sugars were very high that he could not detect. He sugars here noted to be in the 800s. The patient otherwise complains of being very thirsty, but denies any chest pains or palpitations. He does have some shortness of breath. He complains of asymmetrical swelling of his lower extremities, right greater than left. He also has been having some drainage from the penis, but he states that has been going on and off for the past few weeks. He denies any other complaints, including abdominal pain, nausea, vomiting or diarrhea. He does make urinary and denies any urinary frequency, urgency or hesitancy.   PAST MEDICAL HISTORY:  1.  Hypertension.  2.  Hyperlipidemia.  3.  Coronary artery disease, status post 3-vessel CABG.  4.  Hypothyroidism.  5.  End-stage renal disease on hemodialysis Tuesday/Thursday/Saturday.  6.  Type 1 diabetes diagnosed at about the age of 74.  7.  Peripheral vascular disease with amputation of his toes.   ALLERGIES: AUGMENTIN AND RIFAMPIN.   SOCIAL HISTORY: Denies alcohol or tobacco abuse.   FAMILY HISTORY: Positive for hypertension, as well as diabetes.   HOME MEDICATIONS: Aspirin  81 mg 1 tablet p.o. daily, carvedilol 25 mg 1 tablet p.o. b.i.d., clonidine 0.1 mg 1 tablet p.o. b.i.d., Crestor 20 mg at bedtime, Lasix 80 mg 1 tablet p.o. b.i.d., Humalog 15 units t.i.d. prior to each meal, isosorbide mononitrate 30 mg daily, Lantus 34 units at bedtime, levothyroxine 75 mcg daily, Lyrica 150 mg 1 tablet p.o. b.i.d., omega-3 1000 mg 1 tablet p.o. at bedtime, omeprazole 40 mg 1 tablet p.o. daily, Sensipar 60 mg daily.   REVIEW OF SYSTEMS:  CONSTITUTIONAL: Denies any fevers. Complains of fatigue and weakness. No weight loss. No weight gain.  EYES: No blurred or double vision. No redness. No inflammation.  EARS, NOSE, AND THROAT: No tinnitus. No ear pain. No hearing loss. No seasonal or year-round allergies. No epistaxis. No nasal discharge. No difficulty swallowing.  RESPIRATORY: Denies any cough, wheezing, hemoptysis. No chronic obstructive pulmonary disease. No TB.  CARDIOVASCULAR: Denies any chest pain or orthopnea. Complains of edema of the lower extremity.  GASTROINTESTINAL: No nausea, vomiting, diarrhea. No abdominal pain. No hematemesis. No melena. No guarding. No IBS. No jaundice.  GENITOURINARY: Denies any dysuria, hematuria or renal colic.  ENDOCRINE: Does complain of <<(Dictation Anomaly). . He has a history of hypothyroidism.  HEMATOLOGIC: He denies anemia, easy bruisability or bleeding.  SKIN: Denies any acne or rash.  MUSCULOSKELETAL: Denies any pain in the back or shoulder.  NEUROLOGIC: No CVA, TIA or seizures.  PSYCHIATRIC: No anxiety, insomnia, or ADD.   PHYSICAL EXAMINATION:  GENERAL: The patient is a 44 year old, well-developed male,  who appears a little older than his stated age.  VITAL SIGNS: Temperature 98.3, pulse 86, respirations 16, blood pressure 172/85.  GENERAL: The patient is a well-developed male, who appears older than his stated age.  HEENT: Head atraumatic, normocephalic. Pupils equally round and reactive to light and accommodation. There is no  conjunctival pallor. No sclerae icterus. Nasal exam shows no drainage or ulceration. External ear exam shows no erythema or drainage.  NECK: Supple, without any thyromegaly.  LUNGS: Clear to auscultation bilaterally, without any rales, rhonchi or wheezing. No accessory muscle usage.  CARDIOVASCULAR: Regular rate and rhythm. No murmurs, rubs, clicks, or gallops. PMI is not displaced.  ABDOMEN: Soft, nontender, nondistended. Positive bowel sounds x 4.  MUSCULOSKELETAL: There is no erythema or swelling.  NEUROLOGICAL: Cranial nerves II through XII grossly intact. Strength 5/5. SKIN: He has some penile necrosis at the tip of the penis, but his urologist is aware of this with his recent penile surgery.  PSYCHIATRIC: Not anxious or depressed.  LYMPH NODES: Nonpalpable.  VASCULAR: Good DP and PT pulses.   LABORATORY DATA: Glucose 793, BUN 33, creatinine 3.85, sodium 126, potassium 4.6, chloride 89, calcium 7.9. LFTs are normal, except alkaline phosphatase of 379. WBC 6.6, hemoglobin 9.7, platelet count 92,000. Lactic acid level of 1.2.   ASSESSMENT AND PLAN: The patient is a 44 year old white male who presents with high blood sugar.  1.  Hyperglycemia due to nonketotic state with severely elevated blood sugar. He will need to be on IV insulin. We will give him IV fluids and IV insulin. Once his blood glucose is less than 250, we can restart him on a high-dose sliding scale, premeal NovoLog as he was taking, and resume his Lantus at a higher dose.  2.  Penile drainage with recent penile implant with extraction. He is afebrile. WBC is not elevated; however, with the drainage, I am going to go ahead and give him vancomycin for the time being.  3.  End-stage renal disease, nephrology consult for hemodialysis.  4.  Coronary artery disease. Continue aspirin, carvedilol, and Imdur.  5.  Asymmetric lower extremity swelling. We will get Doppler of the lower extremities to make sure he does not have deep vein  thrombosis.  6.  Hypertension, continue clonidine.  7.  Hyperlipidemia. Continue Crestor.  8.  Hypothyroidism. Continue Synthroid. We will check a TSH.  9.  Elevated alkaline phosphatase of unclear etiology, possibly related to his penile surgery. This needs to be monitored as an outpatient.  10.  Miscellaneous: We will given heparin for deep vein thrombosis prophylaxis.   NOTE: 60 minutes of critical care time spent. The patient will be monitored in the CCU. Blood glucose will be checked every hour. His insulin regimen will be adjusted.     ____________________________ Lacie Scotts Allena Katz, MD shp:MT D: 09/07/2014 15:56:29 ET T: 09/07/2014 16:25:37 ET JOB#: [id number]  cc: Xue Low H. Allena Katz, MD, <Dictator> Charise Carwin MD ELECTRONICALLY SIGNED 09/26/2014 19:34
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
## 2015-03-03 ENCOUNTER — Inpatient Hospital Stay
Admission: EM | Admit: 2015-03-03 | Discharge: 2015-03-12 | DRG: 637 | Disposition: A | Discharge status: Skilled Nursing Facility | Payer: Medicare Other | Attending: Internal Medicine | Admitting: Internal Medicine

## 2015-03-03 DIAGNOSIS — I2510 Atherosclerotic heart disease of native coronary artery without angina pectoris: Secondary | ICD-10-CM | POA: Diagnosis present

## 2015-03-03 DIAGNOSIS — E872 Acidosis: Secondary | ICD-10-CM | POA: Diagnosis present

## 2015-03-03 DIAGNOSIS — R188 Other ascites: Secondary | ICD-10-CM | POA: Diagnosis present

## 2015-03-03 DIAGNOSIS — Z9119 Patient's noncompliance with other medical treatment and regimen: Secondary | ICD-10-CM | POA: Diagnosis present

## 2015-03-03 DIAGNOSIS — E10649 Type 1 diabetes mellitus with hypoglycemia without coma: Secondary | ICD-10-CM | POA: Diagnosis present

## 2015-03-03 DIAGNOSIS — E875 Hyperkalemia: Secondary | ICD-10-CM | POA: Diagnosis present

## 2015-03-03 DIAGNOSIS — J80 Acute respiratory distress syndrome: Secondary | ICD-10-CM | POA: Diagnosis present

## 2015-03-03 DIAGNOSIS — E871 Hypo-osmolality and hyponatremia: Secondary | ICD-10-CM | POA: Diagnosis present

## 2015-03-03 DIAGNOSIS — D631 Anemia in chronic kidney disease: Secondary | ICD-10-CM | POA: Diagnosis present

## 2015-03-03 DIAGNOSIS — E211 Secondary hyperparathyroidism, not elsewhere classified: Secondary | ICD-10-CM | POA: Diagnosis present

## 2015-03-03 DIAGNOSIS — E876 Hypokalemia: Secondary | ICD-10-CM | POA: Diagnosis present

## 2015-03-03 DIAGNOSIS — R140 Abdominal distension (gaseous): Secondary | ICD-10-CM

## 2015-03-03 DIAGNOSIS — E1010 Type 1 diabetes mellitus with ketoacidosis without coma: Secondary | ICD-10-CM | POA: Diagnosis present

## 2015-03-03 DIAGNOSIS — Z794 Long term (current) use of insulin: Secondary | ICD-10-CM

## 2015-03-03 DIAGNOSIS — N186 End stage renal disease: Secondary | ICD-10-CM | POA: Diagnosis present

## 2015-03-03 DIAGNOSIS — Z89431 Acquired absence of right foot: Secondary | ICD-10-CM | POA: Diagnosis not present

## 2015-03-03 DIAGNOSIS — N492 Inflammatory disorders of scrotum: Secondary | ICD-10-CM | POA: Diagnosis present

## 2015-03-03 DIAGNOSIS — I120 Hypertensive chronic kidney disease with stage 5 chronic kidney disease or end stage renal disease: Secondary | ICD-10-CM | POA: Diagnosis present

## 2015-03-03 DIAGNOSIS — Z951 Presence of aortocoronary bypass graft: Secondary | ICD-10-CM

## 2015-03-03 DIAGNOSIS — Z992 Dependence on renal dialysis: Secondary | ICD-10-CM | POA: Diagnosis not present

## 2015-03-03 DIAGNOSIS — N4829 Other inflammatory disorders of penis: Secondary | ICD-10-CM | POA: Diagnosis present

## 2015-03-03 LAB — CBC
HCT: 33.3 % — ABNORMAL LOW (ref 40.0–52.0)
HGB: 10.2 g/dL — AB (ref 13.0–18.0)
MCH: 30.1 pg (ref 26.0–34.0)
MCHC: 30.7 g/dL — AB (ref 32.0–36.0)
MCV: 98 fL (ref 80–100)
PLATELETS: 116 10*3/uL — AB (ref 150–440)
RBC: 3.4 10*6/uL — AB (ref 4.40–5.90)
RDW: 17.6 % — ABNORMAL HIGH (ref 11.5–14.5)
WBC: 7.5 10*3/uL (ref 3.8–10.6)

## 2015-03-03 LAB — COMPREHENSIVE METABOLIC PANEL
ALBUMIN: 2.6 g/dL — AB
ALK PHOS: 202 U/L — AB
Anion Gap: 22 — ABNORMAL HIGH (ref 7–16)
BILIRUBIN TOTAL: 1 mg/dL
BUN: 29 mg/dL — ABNORMAL HIGH
CREATININE: 4.36 mg/dL — AB
Calcium, Total: 6.7 mg/dL — CL
Chloride: 86 mmol/L — ABNORMAL LOW
Co2: 17 mmol/L — ABNORMAL LOW
EGFR (Non-African Amer.): 15 — ABNORMAL LOW
GFR CALC AF AMER: 18 — AB
GLUCOSE: 675 mg/dL — AB
Potassium: 6.5 mmol/L
SGOT(AST): 16 U/L
SGPT (ALT): 12 U/L — ABNORMAL LOW
Sodium: 125 mmol/L — ABNORMAL LOW
Total Protein: 6.3 g/dL — ABNORMAL LOW

## 2015-03-03 LAB — TROPONIN I: Troponin-I: 0.03 ng/mL

## 2015-03-03 LAB — URINALYSIS, COMPLETE
Bacteria: NONE SEEN
Bilirubin,UR: NEGATIVE
Nitrite: NEGATIVE
Ph: 6 (ref 4.5–8.0)
Protein: >=500
SPECIFIC GRAVITY: 1.014 (ref 1.003–1.030)
Squamous Epithelial: NONE SEEN
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
## 2015-03-03 NOTE — Op Note (Signed)
PATIENT NAME:  Taylor [name], Taylor [name] MR#:  [id number] DATE OF BIRTH:  1971/04/14  DATE OF PROCEDURE:  02/20/2012  PREOPERATIVE DIAGNOSIS: Gangrene fifth ray, right foot.   POSTOPERATIVE DIAGNOSIS: Gangrene fifth ray, right foot.   PROCEDURE: Transmetatarsal amputation, right foot.   SURGEON: Rhona Raider. Alexanderjames Berg, DPM   ASSISTANT: None.   ANESTHESIA: General.   ANESTHESIOLOGIST: Berdine Addison, MD   ESTIMATED BLOOD LOSS: 75 mL.   HISTORY OF PRESENT ILLNESS: The patient had already had a first ray resection and partial second ray resection secondary to osteomyelitis. He developed gangrenous changes to his fifth toe and had an angioplasty yesterday with three level improvements and angioplasty and stenting, so has got much better flow at this timeframe. Exam has shown gangrenous changes of infection to the right fifth metatarsal area. We felt like we needed to go ahead and do a transmetatarsal amputation to balance things out across this region. I didn't want to leave him just two toes and two metatarsal heads.   DESCRIPTION OF PROCEDURE: The patient was brought to the Operating Room and placed on the Operating Room table in the supine position. At this point after general anesthesia was achieved, the patient was prepped and draped in the usual sterile manner.   At this time attention was directed to the distal portion of the right foot. A skin marker was used to plan out the incision margin. A typical fishmouth incision was made medially, but incision was made around some gangrenous tissue on the right fifth metatarsal and fifth toe region. This was in attempt to try and preserve as much healthy tissue as possible. The incision was then made full thickness down to bone. The soft tissue was flapped dorsally away from the metatarsals and also plantarly. The metatarsal levels were identified, and a power blade was used to resect the metatarsal across the shaft region. At this point, the remaining  soft tissue was removed, and the distal portion of the foot and remaining three toes was removed. At this point, a VersaJet was used to remove any necrotic tissue and anything that appeared to be infected. This was removed nicely. Good bleeding was encountered across the foot at this timeframe. The area was then copiously irrigated multiple times following the VersaJet usage. At this point, 4-0 Vicryl was used to suture deep and superficial fascia. Bleeders were clamped and bovied as required. A 3-0 nylon was used to suture the skin using a combination of simple interrupted vertical mattress and horizontal mattress sutures. There was sort of a double flap method used on the lateral aspect in order to get things closed following resection of the necrotic tissue and also the ulcer plantarly. The patient was then placed in a wound VAC and sterile dressing. The patient appeared to tolerate the procedure and anesthesia well and left the Operating Room for the recovery room with vital signs stable and neurovascular status intact.   ____________________________ Rhona Raider. Verlisa Vara, DPM mgt:cbb D: 02/20/2012 09:51:33 ET T: 02/20/2012 16:08:41 ET JOB#: [id number]  cc: Rhona Raider Naoko Diperna, DPM, <Dictator> Epimenio Sarin MD ELECTRONICALLY SIGNED 03/28/2012 10:39

## 2015-03-03 NOTE — Op Note (Signed)
PATIENT NAME:  Taylor [name], Taylor [name] MR#:  [id number] DATE OF BIRTH:  [birth year]  DATE OF PROCEDURE:  02/19/2012  PREOPERATIVE DIAGNOSES:  1. 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  2. Osteomyelitis, right fifth ray.   POSTOPERATIVE DIAGNOSES:  1. 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  2. Osteomyelitis, right fifth ray.   PROCEDURES PERFORMED:  1. Abdominal aortogram.  2. Right lower extremity distal runoff, third order catheter placement.  3. Percutaneous transluminal angioplasty of the right anterior tibial artery to 3 mm.  4. Percutaneous transluminal angioplasty of the right SFA and popliteal artery with stent placement.   SURGEON: Renford Dills, M.D.   SEDATION: Versed 4 mg plus fentanyl 150 mcg administered IV. Continuous ECG, pulse oximetry and cardiopulmonary monitoring was performed throughout the entire procedure by the interventional radiology nurse. Total sedation time was 1 hour, 20 minutes.   ACCESS: 6 French sheath, left common femoral artery.   FLUORO TIME: 13.5 minutes.   CONTRAST USED: 110 mL.   INDICATIONS: Taylor [name] is a 44 year old gentleman who presented to Dr. Racheal Patches office with worsening cellulitic changes of his right foot. He is currently being treated for an ulceration and suspicions for osteomyelitis of the fifth metatarsal head have been raised. He is now admitted. He has known atherosclerotic changes from prior studies and is therefore at risk for wound healing complications secondary to poor perfusion. I am asked to evaluate and to treat to allow for limb salvage. Risks and benefits were reviewed and the patient has agreed to proceed.   DESCRIPTION OF PROCEDURE: The patient is taken to the operating room and placed in the supine position. After adequate sedation is achieved, both groins are prepped and draped  in sterile fashion. 1% lidocaine is infiltrated in the soft tissues overlying the left femoral impulse and access to the femoral artery is obtained with a micropuncture needle, microwire followed micro sheath, J-wire followed by 5 French sheath, and 5 French pigtail catheter. The pigtail catheter is positioned at the level of T12 and AP projection of the aorta is obtained. LAO projection of the pelvis is obtained after the pigtail catheter is repositioned to above the bifurcation. Using the pigtail catheter and stiff angled Glidewire, the wire at the aortic bifurcation is crossed, oblique views of the right groin are obtained, and with moderate difficulty the superficial femoral artery is engaged as there is a rather severe 70 to 75% ostial stenosis, which appears quite focal, at the origin of the SFA. With the pigtail catheter now positioned in the proximal SFA, representing third order catheter placement, distal runoff is obtained showing greater than 90% stenosis in the distal one third of the SFA extending toward Hunter's canal. A secondary 65 to 75% stenosis in the mid popliteal is also noted. There is a high origin of his anterior tibial which actually takes off from the mid popliteal level and there is a 4 to 6 cm segment of greater than 90% stenosis in the anterior tibial. The tibioperoneal trunk, posterior tibial, and peroneal appear patent.   After review of these images, 4000 units of heparin is given. An Ansel 6 [place] sheath is advanced up and over the bifurcation and positioned with the tip in the common femoral, wire exchange is made for an 0.014 wire, and using the Glidewire and a straight slip catheter, the anterior tibial is engaged, the catheter is advanced past the stenosis and hand  injection of contrast is utilized to verify intraluminal placement. A 3 mm x 10 cm balloon is then advanced over an 0.014 wire across the anterior tibial. Two separate inflations are required to cover the distance  adequately and each inflation is for one to two minutes at 12 atmospheres. The mid popliteal lesion is angioplastied to 5 mm with excellent result. The 90% lesion of the SFA, near the level of Hunter's canal, is angioplastied to 5 mm as well. A significant dissection is encountered, which is flow-limiting, and therefore a 7 x 80 Life stent is deployed across this lesion and post dilated to 6 mm. The origin of the SFA is then dilated using a 6 mm balloon to 12 atmospheres for one minute. Injecting through the Ansel, follow-up angiography now demonstrates less than 15% residual stenosis, at the origin of the SFA. There is no residual stenosis across the stented segment with no evidence of dissection, rapid flow of contrast, the popliteal lesion has been eliminated, and the anterior tibial lesion actually demonstrates almost no residual stenosis. There is still rapid flow of contrast down to the ankle, in the tibioperoneal trunk, peroneal, and posterior tibial as well. Distal runoff is completely preserved and improved with improved flow through the anterior tibial now.   The sheath is pulled into the left external iliac, oblique view of the left groin is obtained, and a StarClose device is deployed without difficulty. There are no immediate complications.   INTERPRETATION: Images of the aorta and iliacs are widely patent. The common femoral and profunda femoris are widely patent. The superficial femoral artery demonstrates two stenoses, one at the origin and one in the mid to distal. This mid to distal lesion is greater than 90%. The popliteal lesion is also identified. These all responded to angioplasty with the middle lesion requiring stent placement and posting to 6 mm. The anterior tibial is treated with a 3 mm inflation with excellent result and the distal tibial three-vessel runoff is now reestablished.   SUMMARY: Successful recanalization of the right lower extremity for limb  salvage. ____________________________ Renford Dills, MD ggs:slb D: 02/23/2012 18:11:22 ET T: 02/24/2012 09:45:27 ET JOB#: [id number]  cc: Renford Dills, MD, <Dictator> Rhona Raider. Troxler, DPM Bryn Gulling. [name], MD Renford Dills MD ELECTRONICALLY SIGNED 02/28/2012 13:55

## 2015-03-03 NOTE — Consult Note (Signed)
General Aspect ASO bilateral lower extremities with ulceration of the right foot    Present Illness The patient is a 44 year old gentleman well known to our service with history of diabetes, end-stage renal disease on hemodialysis, coronary artery disease status post CABG, hypothyroidism, hypertension presenting with complaints of right foot ulcer that has been followed since January by wound clinic. It seems to have been improving. Last Monday was recommended to be seen by vascular surgery and was scheduled to have an angioplasty/angiogram on 04/10 but was canceled due to his fever. He also endorses chills. He presented this evening when noticed some worsening erythema and edema as well as drainage of his right foot. He apparently has been initiated on vancomycin at the dialysis center since 02/16/2012. He denies any pain due to his neuropathy but does endorse right leg and thigh pain, since five days ago. He has known atherosclerotic occlusive disease and has had interventions in the past.  PAST MEDICAL HISTORY:  1. Last admitted 05/29 to 04/20/2011 for management of diabetic ketoacidosis in the setting of sepsis from scrotal cellulitis and orchiepididymitis with MSSA bacteremia.  2. Coronary artery disease status post myocardial infarction and coronary artery bypass graft in 2010 with history of ischemic cardiomyopathy. His last echocardiogram showed improvement with ejection fraction of greater than 55%, mild to moderate tricuspid regurgitation and left ventricular hypertrophy.  3. Gastritis.  4. Hiatal hernia.  5. Anemia of chronic disease.  6. Hypertension.  7. Hyperlipidemia.  8. End-stage renal disease on hemodialysis Tuesdays, Thursdays, and Saturday at [facility].  9. Hypothyroidism.  10. Diabetes with nephropathy, neuropathy, and retinopathy.   Home Medications: Medication Instructions Status  Crestor 20 mg oral tablet 1 tab(s) orally once a day (at bedtime) Active  Fish Oil  1000 mg oral capsule 1 cap(s) orally once a day (at bedtime) Active  Lyrica 150 mg oral capsule 1 cap(s) orally 2 times a day Active  aspirin 325 mg oral tablet 1 tab(s) orally once a day in the morning Active  Coreg 25 mg oral tablet  Active  acetaminophen 650 mg oral tablet, extended release every 4 hours as needed for pain or fever Active  Emla 2.5%-2.5% topical cream every 12-24 hours as needed for graft/fistula Active  lisinopril 20 mg oral tablet  Active  omeprazole 40 mg oral delayed release capsule  Active  levothyroxine 75 mcg (0.075 mg) oral tablet 1 tab(s) orally once a day Active  Novolin R 100 units/mL injectable solution 7 unit(s) injectable 3 times a day plus sliding scale Active  Lasix 80 mg oral tablet 1 tab(s) orally 2 times a day Active  Lantus 100 units/mL subcutaneous solution 18 unit(s) subcutaneous once (at bedtime) Active  doxycycline hyclate 100 mg oral tablet 1 tab(s) orally 2 times a day until 02/29/12 Active    Augmentin: N/V/Diarrhea, Other  Rifampin: N/V/Diarrhea  Dilaudid: Other  Case History:   Family History Non-Contributory    Social History negative tobacco, negative ETOH, negative Illicit drugs   Review of Systems:   Fever/Chills Yes    Cough No    Sputum No    Abdominal Pain No    Diarrhea No    Constipation No    Nausea/Vomiting Yes    SOB/DOE No    Chest Pain No    Telemetry Reviewed NSR    Dysuria No    Tolerating PT No    Tolerating Diet Yes   Physical Exam:   GEN well developed, thin, moderate distress  HEENT pale conjunctivae, PERRL, dry oral mucosa    NECK supple  No masses  trachea midline    RESP normal resp effort  no use of accessory muscles    CARD regular rate  LE edema present  no JVD    VASCULAR ACCESS AV fistula present  Good bruit  Good thrill    ABD denies tenderness  soft  nondistended    EXTR negative cyanosis/clubbing, positive edema, right leg edema with erythema;  plantar ulcer under  metatarsal head    SKIN positive rashes, positive ulcers, tight to palpation    NEURO cranial nerves intact, follows commands, motor/sensory function intact    PSYCH alert, good insight   Nursing/Ancillary Notes: **Vital Signs.:   11-Apr-13 04:39   Vital Signs Type Admission   Temperature Temperature (F) 99.2   Celsius 37.3   Temperature Source oral   Pulse Pulse 87   Pulse source per Dinamap   Respirations Respirations 18   Systolic BP Systolic BP 638   Diastolic BP (mmHg) Diastolic BP (mmHg) 80   Mean BP 111   BP Source Dinamap   Pulse Ox % Pulse Ox % 95   Pulse Ox Activity Level  At rest   Oxygen Delivery Room Air/ 21 %   Blood Glucose:  10-Apr-13 22:41    POCT Blood Glucose >600  Routine Chem:  10-Apr-13 22:42    Glucose, Serum 615   BUN 47   Creatinine (comp) 5.15   Sodium, Serum 124   Potassium, Serum 4.3   Chloride, Serum 88   CO2, Serum 24   Calcium (Total), Serum 9.2   Anion Gap 12   Osmolality (calc) 291   eGFR (African American) 16   eGFR (Non-African American) 13  Routine Hem:  10-Apr-13 22:42    WBC (CBC) 7.1   RBC (CBC) 3.13   Hemoglobin (CBC) 9.2   Hematocrit (CBC) 28.3   Platelet Count (CBC) 192   MCV 90   MCH 29.3   MCHC 32.5   RDW 16.3  11-Apr-13 04:39    Erythrocyte Sed Rate > 140  Blood Glucose:  11-Apr-13 07:31    POCT Blood Glucose 463  Routine Micro:  11-Apr-13 08:19    Specimen Source R FOOT   Clindamycin Sensitivity S   Oxacillin Sensitivity S   Ciprofloxacin Sensitivity R   Gentamicin Sensitivity S   Erythromycin Sensitivity S   Linezolid Sensitivity S   Tigecycline Sensitivity S   Trimethoprim/Sulfamethoxazole Sensitivty S   Lefofloxacin Sensitivity R   Cefoxitin Scrn. NEGATIVE  LabUnknown:  11-Apr-13 08:19    Ind. Clindamycin Resistance NEGATIVE  Routine Chem:  11-Apr-13 09:45    Phosphorus, Serum 4.1  Blood Glucose:  11-Apr-13 16:14    POCT Blood Glucose 333    20:30    POCT Blood Glucose 273   Radiology  Results: [name]:    11-Apr-13 03:35, [name] Color Flow Doppler Lower Extrem Right   [name] Color Flow Doppler Lower Extrem Right    REASON FOR EXAM:    Edema, erythema  COMMENTS:       PROCEDURE: [name]  - US DOPPLER LOW EXTR RIGHT  - Feb 18 2012  3:35AM     RESULT: Comparison: None    Technique and findings: Multiple longitudinal and transverse grayscale as   well as color and spectral Doppler images of the right lower extremity   veins were obtained from the common femoral veins through the popliteal   veins.    The right  common femoral, femoral, and popliteal veins are patent,   demonstrating normal color-flow and compressibility. No intraluminal   thrombus is identified.  There is normal respiratory variation and     augmentation demonstrated at all vein levels.    IMPRESSION:    No evidence of DVT in the right lower extremity.          Verified By: Gregor Hams, M.D.,MD     Impression 1.  Atherosclerosis of the lower extremities           patient will need angiography and intervention           the risks and benefits as well as the alternative therapies were discussed with the patient and he agrees to proceed. 2.  Cellulitis and osteomyelitis            patient on antibiotics             Dr Clayborn Bigness on consult 3.  End stage rena disease            continue HD             nephrology on consult 4.  Diabetes              on sliding scale               [facility] managing 5.  Hypothyroid              continue synthroid 6.  Hyperlipidemia              continue statin    Plan Level 5   Electronic Signatures: Hortencia Pilar (MD)  (Signed 17-Apr-13 10:29)  Authored: General Aspect/Present Illness, Home Medications, Allergies, History and Physical Exam, Vital Signs, Labs, Radiology, Impression/Plan   Last Updated: 17-Apr-13 10:29 by Hortencia Pilar (MD)

## 2015-03-03 NOTE — Consult Note (Signed)
Brief Consult Note: Diagnosis: ASO with ulceration.   Patient was seen by consultant.   Recommend to proceed with surgery or procedure.   Comments: will plan angio in am.  Electronic Signatures: Levora Dredge (MD)  (Signed 11-Apr-13 18:16)  Authored: Brief Consult Note   Last Updated: 11-Apr-13 18:16 by Levora Dredge (MD)
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
## 2015-03-03 NOTE — Consult Note (Signed)
PATIENT NAME:  Taylor [name], Taylor [name] MR#:  [id number] DATE OF BIRTH:  06/06/1971  DATE OF CONSULTATION:  02/19/2012  REFERRING PHYSICIAN:  Enedina Finner, MD   CONSULTING PHYSICIAN:  A. Wendall Mola, MD  CHIEF COMPLAINT: Uncontrolled diabetes.   HISTORY OF PRESENT ILLNESS: The patient is a 44 year old male with a history of uncontrolled type 1 diabetes complicated by peripheral neuropathy, nephropathy, and retinopathy. He has end-stage renal disease and has been on dialysis for the last three years. He has had a history of recurrent foot ulcerations and has previously undergone amputations of toes on both feet. He is admitted for a nonhealing ulcer to the right foot with planned amputation for tomorrow. Blood sugar control is erratic. He recalls his last A1c in February was 15.5%. On presentation here on 02/17/2012, initial blood sugar was 615, bicarbonate was 24, and he did not have an anion gap. He is currently following with Dr. Leslie Dales, endocrinologist in [place]. His outpatient regimen is Lantus 7 units b.i.d. and NovoLog 7 units t.i.d. before meals plus a sliding scale of 2 units per every 50 of sugar over 150 before meals. He states that since he developed this infection in his foot, sugars have been running high. Over the last 24 hours, blood sugars have been in the range of 255 to 392. Yesterday he received Lantus 15 units in the morning and 8 units in the evening. Today no Lantus was given. He is currently receiving regular insulin 7 units t.i.d. before meals in addition to a NovoLog sliding scale of 2 units per 50 over 150. He is currently on a clear liquid diet and will be n.p.o. after midnight tonight for his surgery.   PAST MEDICAL HISTORY:  1. Type 1 diabetes, diagnosed at age 61.  2. Recurrent diabetic ketoacidosis.  3. Diabetic nephropathy. 4. Diabetic peripheral neuropathy. 5. Diabetic retinopathy.  6. End-stage renal disease on hemodialysis.  7. Coronary artery disease.   8. History of ischemic cardiomyopathy.  9. Recurrent foot ulcerations.  10. Anemia of chronic disease.  11. Hypertension.  12. Hyperlipidemia.  13. Hypothyroidism.  14. History of depression.  15. History of vitamin D deficiency. 16. Secondary hyperparathyroidism.   PAST SURGICAL HISTORY:  1. Multiple toe amputations.  2. Coronary artery bypass graft, 2010.  3. Left arm fistula.  4. Right arm fistula.  5. Right eye vitrectomy.   ALLERGIES: Augmentin, rifampin, Dilaudid.   INPATIENT MEDICATIONS: 1. Coreg 25 mg b.i.d.  2. Lasix 80 mg b.i.d.  3. Zestril 20 mg daily.  4. Zosyn 3.375 mg every 12 hours.  5. Lyrica 150 mg b.i.d.  6. Heparin 5000 units every 8 hours.  7. NovoLog sliding scale, as per history of present illness.  8. Regular insulin 7 units t.i.d. a.c.   SOCIAL HISTORY: The patient is engaged. He has no children. He is not employed. He does not smoke cigarettes. He drinks beer on occasion, less than once per week.   FAMILY HISTORY: Mother had diabetes.    REVIEW OF SYSTEMS: GENERAL: No weight loss. He has had fevers. HEENT: He has blurred vision, chronic. He denies sore throat. NECK: No neck pain. No dysphagia. CARDIAC: No chest pain. No palpitations. PULMONARY: No cough. No shortness of breath. ABDOMEN: No abdominal pain. Good appetite. EXTREMITIES: Foot pain with numbness due to chronic neuropathy. GU: Denies dysuria. No polyuria. HEMATOLOGIC: No easy bruisability or recent bleeding.   PHYSICAL EXAMINATION:  VITAL SIGNS: Temperature 98, pulse 81, respiratory rate 20, blood pressure 135/72, pulse oximetry  98% on room air.   GENERAL: Chronically ill-appearing white male in no acute distress.   HEENT: Extraocular movements are intact. Oropharynx is clear.   NECK: Supple. No thyromegaly.   CARDIAC: Regular rate and rhythm.   PULMONARY: Clear to auscultation bilaterally. No wheeze.   ABDOMEN: Diffusely soft, nontender, nondistended.  EXTREMITIES: Right foot was  wrapped and not examined. It did have a foul smell. Left foot with absent great toe and second toe. No lesions or ulcerations on left foot.   PSYCHIATRIC: Alert and oriented, calm and cooperative.   EXTREMITIES: No edema is present.   LABORATORY DATA: On 04/102013, glucose was 615, BUN 47, creatinine 5.15, sodium 124, potassium 4.3, chloride 88, calcium 9.2, hematocrit 28.3.   ASSESSMENT: A 44 year old male with chronically uncontrolled type 1 diabetes complicated by nephropathy, peripheral neuropathy, and retinopathy, admitted with right foot fifth and fourth toe gangrene and osteomyelitis-scheduled for transmetatarsal amputation for tomorrow.   RECOMMENDATIONS: Uncontrolled diabetes: Plan to give Lantus 15 units tonight and NovoLog 7 units t.i.d. a.c., if eating. We will modify the sliding scale to be more aggressive in improving blood sugar control, will give 3 units for a target of 40/150. Expect glycemic control to improve once infection has cleared.   I will follow along with you. Thank you for the kind request for consultation.  ____________________________ A. Wendall Mola, MD ams:cbb D: 02/19/2012 17:18:32 ET T: 02/20/2012 14:14:10 ET JOB#: [id number] cc: A. Wendall Mola, MD, <Dictator> Macy Mis MD ELECTRONICALLY SIGNED 02/21/2012 12:43

## 2015-03-03 NOTE — Discharge Summary (Signed)
PATIENT NAME:  Taylor [name], Taylor [name] MR#:  [id number] DATE OF BIRTH:  06-Mar-1971  DATE OF ADMISSION:  02/18/2012 DATE OF DISCHARGE:  02/23/2012  ADMITTING PHYSICIAN: Taylor Phichith, MD  DISCHARGING PHYSICIAN: Taylor Lighter, MD  PRIMARY CARE PHYSICIAN: Taylor Norris, MD - [facility]  CONSULTANTS: 1. Taylor [name], Taylor - Podiatry. 2. Taylor Lame, MD - Endocrinology. 3. Taylor Legato, MD - Nephrology. 4. Taylor Pilar, MD - Vascular Surgery for angiogram. Mercer, MD - Wound Care.   DISCHARGE DIAGNOSES:  1. Right foot ulcer with gangrene without osteomyelitis, status post transmetatarsal amputation on 02/20/2012.  2. Peripheral vascular disease status post angiogram and angioplasty done by Dr. Hortencia [name] this admission to the right leg.  3. Insulin-dependent diabetes mellitus.  4. Coronary artery disease status post bypass graft surgery.  5. End-stage renal disease on Tuesday, Thursday, and Saturday hemodialysis.  6. Anemia of chronic disease.  7. Diabetic neuropathy.  8. Diabetic nephropathy.  9. Diabetic retinopathy. 10. Vitamin D deficiency.  11. Hypothyroidism.  12. Depression.  13. Hypertension.  14. Hyperlipemia.    DISCHARGE HOME MEDICATIONS:  1. Crestor 20 mg p.o. daily.  2. Fish oil 1 gram p.o. daily.  3. Lyrica 150 mg p.o. twice a day.  4. Aspirin 325 mg p.o. daily.  5. Coreg 25 mg p.o. twice a day. 6. Tylenol 650 mg p.o. every 4 hours p.r.n. for pain.  7. Emla 2.5%-2.5% topical cream every 12 to 24 hours as needed for graft or fistula.  8. Lisinopril 20 mg p.o. daily.  9. Prilosec 40 mg p.o. daily.  10. Levothyroxine 75 mcg p.o. daily.  11. Novolin Regular insulin 7 units injectable three times daily, plus sliding scale.  12. Lasix 80 mg p.o. twice a day.  13. Lantus 18 units subcutaneous at bedtime.  14. Doxycycline 100 mg p.o. twice a day, until 02/29/2012.  DISCHARGE DIET: Renal diet.   DISCHARGE ACTIVITY: As  tolerated.   FOLLOWUP INSTRUCTIONS:  1. PCP followup in 1 to 2 weeks.  2. Follow up with Dr. Elvina [name] in one week.  3. Home health recommended.  4. Dialysis per schedule.   LABS AND IMAGING STUDIES: Sodium 134, potassium 4.3, chloride 195, bicarbonate 28, BUN 39, creatinine 5.13, glucose 270, and calcium 8.9. WBC 8.2, hemoglobin 8.1, hematocrit 24.6, and platelet count 220.  Stool cultures are negative for any organisms. C. difficile is negative.   Wound culture is growing moderate growth of Taylor [name] and also Taylor [name], moderate growth.   ESR is elevated greater than 140 and CRP is elevated at 220. PTH is elevated at 183.   Ultrasound Doppler of right lower extremity: Negative for any deep venous thrombosis.   Right foot x-ray: Gas in soft tissues of dorsum of fifth toe and fourth and fifth metatarsophalangeal joints with suggestion of infection. No bony destruction seen. Prior amputations of first and second toes noted. Vascular calcification seen in anterior posterior tibial arteries. No evidence of any soft tissue gas above the site is seen.  Blood cultures remain negative.   BRIEF HOSPITAL COURSE: Taylor [name] is a 44 year old Caucasian male with multiple medical problems, as mentioned above, with history of diabetes mellitus and its complications, anemia, end-stage renal disease on hemodialysis, coronary artery disease status post bypass graft surgery, and prior amputation of right foot toes who presented to the hospital with severe right foot swelling, redness, and also drainage. He has been seen by vascular surgeon, Dr. Hortencia [name], and was scheduled to have an  angiogram and angioplasty, but it was canceled due to fevers. He was also followed by the Taylor [name].  1. Right foot cellulitis/gangrene from diabetic ulcer without evidence of osteomyelitis. Blood cultures were negative and wound cultures were growing Taylor [name]. He was placed on vancomycin and Zosyn while in the  hospital. He was also seen by Taylor [name] who had done angiogram and also angioplasty of his right lower extremity. With the gangrenous nature of his foot ulcer, podiatrist, Dr. Elvina [name], has taken the patient to the operating room and did a right foot transmetatarsal amputation on 02/20/2012. Postop the patient's wound has been dressed by podiatry and had a wound VAC placed up until today. He just had the wound VAC taken out by Dr. Elvina [name] who felt that the wound is healing and will need outpatient follow-up in one week. Home health is recommended for the patient, especially because he is nonweightbearing on his right foot. Dr. Elvina [name] has ordered an OrthoWedge shoe for transfers. Since there is no osteomyelitis, he can be discharged on oral medications, so the patient will be discharged on doxycycline to cover for his Taylor [name] as he is allergic to penicillin, especially Augmentin.  2. End-stage renal disease, on hemodialysis. The patient has had dialysis per schedule here. The last dialysis was today prior to discharge.  3. Diabetes mellitus with uncontrolled sugars. He was seen by endocrinologist, Taylor [name], who has adjusted his insulin. He will be taking Lantus 18 units subcutaneous at bedtime and Regular insulin three times a day, 7 units prior to meals, along with sliding scale. He can follow up as an outpatient.  4. For his hypertension and coronary artery disease, all of his other home medications were continued as is and nothing has changed. His course has been otherwise uneventful in the hospital.   DISCHARGE CONDITION: Stable.   DISCHARGE DISPOSITION: Home.   TIME SPENT ON DISCHARGE: 45 minutes. ____________________________ Taylor Lighter, MD rk:slb D: 02/23/2012 14:45:41 ET T: 02/24/2012 14:11:40 ET JOB#: [id number]  cc: Taylor Lighter, MD, <Dictator> Taylor [name]. Taylor Medina, MD Taylor [name], Taylor Taylor Cabal, MD Taylor Lighter MD ELECTRONICALLY SIGNED 02/26/2012 14:37

## 2015-03-04 LAB — BASIC METABOLIC PANEL
ANION GAP: 9 (ref 7–16)
Anion Gap: 16 (ref 7–16)
BUN: 32 mg/dL — ABNORMAL HIGH
BUN: 31 mg/dL — ABNORMAL HIGH
CALCIUM: 6.7 mg/dL — AB
CO2: 21 mmol/L — AB
Calcium, Total: 6.7 mg/dL — CL
Chloride: 90 mmol/L — ABNORMAL LOW
Chloride: 96 mmol/L — ABNORMAL LOW
Co2: 25 mmol/L
Creatinine: 4.4 mg/dL — ABNORMAL HIGH
Creatinine: 4.44 mg/dL — ABNORMAL HIGH
EGFR (African American): 17 — ABNORMAL LOW
EGFR (African American): 17 — ABNORMAL LOW
EGFR (Non-African Amer.): 15 — ABNORMAL LOW
GFR CALC NON AF AMER: 15 — AB
GLUCOSE: 507 mg/dL — AB
Glucose: 164 mg/dL — ABNORMAL HIGH
Potassium: 4.6 mmol/L
Potassium: 5.6 mmol/L — ABNORMAL HIGH
SODIUM: 130 mmol/L — AB
Sodium: 127 mmol/L — ABNORMAL LOW

## 2015-03-04 LAB — CBC WITH DIFFERENTIAL/PLATELET
BASOS ABS: 0.1 10*3/uL (ref 0.0–0.1)
Basophil %: 1.4 %
EOS PCT: 5.2 %
Eosinophil #: 0.3 10*3/uL (ref 0.0–0.7)
HCT: 28.8 % — AB (ref 40.0–52.0)
HGB: 9.2 g/dL — ABNORMAL LOW (ref 13.0–18.0)
Lymphocyte #: 0.7 10*3/uL — ABNORMAL LOW (ref 1.0–3.6)
Lymphocyte %: 13 %
MCH: 29.1 pg (ref 26.0–34.0)
MCHC: 31.8 g/dL — ABNORMAL LOW (ref 32.0–36.0)
MCV: 92 fL (ref 80–100)
MONO ABS: 0.4 x10 3/mm (ref 0.2–1.0)
MONOS PCT: 7.5 %
NEUTROS ABS: 4 10*3/uL (ref 1.4–6.5)
Neutrophil %: 72.9 %
PLATELETS: 112 10*3/uL — AB (ref 150–440)
RBC: 3.15 10*6/uL — ABNORMAL LOW (ref 4.40–5.90)
RDW: 17.1 % — AB (ref 11.5–14.5)
WBC: 5.4 10*3/uL (ref 3.8–10.6)

## 2015-03-04 LAB — COMPREHENSIVE METABOLIC PANEL
AST: 24 U/L
Albumin: 2.3 g/dL — ABNORMAL LOW
Alkaline Phosphatase: 173 U/L — ABNORMAL HIGH
Anion Gap: 9 (ref 7–16)
BILIRUBIN TOTAL: 0.6 mg/dL
BUN: 31 mg/dL — AB
CHLORIDE: 93 mmol/L — AB
CO2: 27 mmol/L
Calcium, Total: 6.7 mg/dL — CL
Creatinine: 4.31 mg/dL — ABNORMAL HIGH
GFR CALC AF AMER: 18 — AB
GFR CALC NON AF AMER: 15 — AB
GLUCOSE: 201 mg/dL — AB
POTASSIUM: 4.2 mmol/L
SGPT (ALT): 8 U/L — ABNORMAL LOW
SODIUM: 129 mmol/L — AB
TOTAL PROTEIN: 5.7 g/dL — AB

## 2015-03-04 LAB — LIPID PANEL
Cholesterol: 76 mg/dL
HDL: 37 mg/dL — AB
Ldl Cholesterol, Calc: 27 mg/dL
TRIGLYCERIDES: 60 mg/dL
VLDL Cholesterol, Calc: 12 mg/dL

## 2015-03-04 LAB — TSH
THYROID STIMULATING HORM: 31.87 u[IU]/mL — AB
Thyroid Stimulating Horm: 20.452 u[IU]/mL — ABNORMAL HIGH

## 2015-03-04 LAB — SURGICAL PATHOLOGY

## 2015-03-04 LAB — HEMOGLOBIN A1C: HEMOGLOBIN A1C: 9.4 % — AB

## 2015-03-04 LAB — MAGNESIUM: MAGNESIUM: 2.3 mg/dL

## 2015-03-04 LAB — PHOSPHORUS: Phosphorus: 1.2 mg/dL — ABNORMAL LOW

## 2015-03-05 LAB — PHOSPHORUS: Phosphorus: 1.8 mg/dL — ABNORMAL LOW

## 2015-03-06 LAB — BASIC METABOLIC PANEL
Anion Gap: 8 (ref 7–16)
BUN: 20 mg/dL
CO2: 31 mmol/L
CREATININE: 3.42 mg/dL — AB
Calcium, Total: 7.1 mg/dL — ABNORMAL LOW
Chloride: 98 mmol/L — ABNORMAL LOW
GFR CALC AF AMER: 24 — AB
GFR CALC NON AF AMER: 20 — AB
GLUCOSE: 102 mg/dL — AB
Potassium: 3.8 mmol/L
Sodium: 137 mmol/L

## 2015-03-06 LAB — PHOSPHORUS: Phosphorus: 2.1 mg/dL — ABNORMAL LOW

## 2015-03-06 LAB — URINE CULTURE

## 2015-03-07 LAB — BASIC METABOLIC PANEL
Anion Gap: 8 (ref 7–16)
BUN: 24 mg/dL — ABNORMAL HIGH
CALCIUM: 7 mg/dL — AB
Chloride: 96 mmol/L — ABNORMAL LOW
Co2: 28 mmol/L
Creatinine: 3.42 mg/dL — ABNORMAL HIGH
EGFR (African American): 24 — ABNORMAL LOW
EGFR (Non-African Amer.): 20 — ABNORMAL LOW
Glucose: 496 mg/dL — ABNORMAL HIGH
Potassium: 4.8 mmol/L
Sodium: 132 mmol/L — ABNORMAL LOW

## 2015-03-07 LAB — PHOSPHORUS: PHOSPHORUS: 2.3 mg/dL — AB

## 2015-03-08 LAB — CULTURE, BLOOD (SINGLE)

## 2015-03-08 LAB — BASIC METABOLIC PANEL
ANION GAP: 9 (ref 7–16)
BUN: 30 mg/dL — AB
CHLORIDE: 96 mmol/L — AB
CREATININE: 3.3 mg/dL — AB
Calcium, Total: 7.8 mg/dL — ABNORMAL LOW
Co2: 30 mmol/L
EGFR (African American): 25 — ABNORMAL LOW
EGFR (Non-African Amer.): 21 — ABNORMAL LOW
GLUCOSE: 59 mg/dL — AB
Potassium: 4.1 mmol/L
Sodium: 135 mmol/L

## 2015-03-08 LAB — PHOSPHORUS: Phosphorus: 2.4 mg/dL — ABNORMAL LOW

## 2015-03-09 DIAGNOSIS — Z992 Dependence on renal dialysis: Secondary | ICD-10-CM

## 2015-03-09 DIAGNOSIS — N186 End stage renal disease: Secondary | ICD-10-CM

## 2015-03-09 LAB — PHOSPHORUS: Phosphorus: 2.5 mg/dL

## 2015-03-09 MED ORDER — ALPRAZOLAM 0.25 MG PO TABS
0.2500 mg | ORAL_TABLET | Freq: Four times a day (QID) | ORAL | Status: DC | PRN
  Administered 2015-03-10 – 2015-03-12 (×4): 0.25 mg via ORAL
  Filled 2015-03-09 (×4): qty 1

## 2015-03-09 MED ORDER — CLONIDINE HCL 0.1 MG PO TABS
0.1000 mg | ORAL_TABLET | Freq: Two times a day (BID) | ORAL | Status: DC
  Administered 2015-03-10 – 2015-03-11 (×4): 0.1 mg via ORAL
  Filled 2015-03-09 (×4): qty 1

## 2015-03-09 MED ORDER — ASPIRIN EC 81 MG PO TBEC
81.0000 mg | DELAYED_RELEASE_TABLET | Freq: Every day | ORAL | Status: DC
  Administered 2015-03-10 – 2015-03-11 (×2): 81 mg via ORAL
  Filled 2015-03-09 (×2): qty 1

## 2015-03-09 MED ORDER — SODIUM CHLORIDE 0.9 % IJ SOLN: 3.0000 mL | INTRAMUSCULAR | Status: DC | PRN

## 2015-03-09 MED ORDER — HYDRALAZINE HCL 20 MG/ML IJ SOLN: 10.0000 mg | INTRAMUSCULAR | Status: DC | PRN

## 2015-03-09 MED ORDER — CEPHALEXIN 250 MG PO CAPS
250.0000 mg | ORAL_CAPSULE | Freq: Two times a day (BID) | ORAL | Status: DC
  Administered 2015-03-10 – 2015-03-12 (×5): 250 mg via ORAL
  Filled 2015-03-09 (×6): qty 1

## 2015-03-09 MED ORDER — DOCUSATE SODIUM 100 MG PO CAPS: 100.0000 mg | ORAL_CAPSULE | Freq: Two times a day (BID) | ORAL | Status: DC | PRN

## 2015-03-09 MED ORDER — ONDANSETRON HCL 4 MG/2ML IJ SOLN
4.0000 mg | Freq: Four times a day (QID) | INTRAMUSCULAR | Status: DC | PRN
  Administered 2015-03-10 – 2015-03-12 (×5): 4 mg via INTRAVENOUS
  Filled 2015-03-09 (×5): qty 2

## 2015-03-09 MED ORDER — ACETAMINOPHEN 325 MG PO TABS: 650.0000 mg | ORAL_TABLET | ORAL | Status: DC | PRN

## 2015-03-09 MED ORDER — CALCIUM CARBONATE ANTACID 500 MG PO CHEW
2.0000 | CHEWABLE_TABLET | Freq: Four times a day (QID) | ORAL | Status: DC | PRN
  Administered 2015-03-10 – 2015-03-11 (×2): 400 mg via ORAL
  Filled 2015-03-09 (×2): qty 2

## 2015-03-09 MED ORDER — CARVEDILOL 25 MG PO TABS
25.0000 mg | ORAL_TABLET | Freq: Two times a day (BID) | ORAL | Status: DC
  Administered 2015-03-10 – 2015-03-12 (×5): 25 mg via ORAL
  Filled 2015-03-09 (×6): qty 1

## 2015-03-09 MED ORDER — FLUCONAZOLE 100 MG PO TABS
100.0000 mg | ORAL_TABLET | ORAL | Status: DC
  Administered 2015-03-10 – 2015-03-11 (×2): 100 mg via ORAL
  Filled 2015-03-09 (×2): qty 1

## 2015-03-09 MED ORDER — PAROXETINE HCL 20 MG PO TABS
20.0000 mg | ORAL_TABLET | Freq: Every day | ORAL | Status: DC
  Administered 2015-03-10 – 2015-03-11 (×2): 20 mg via ORAL
  Filled 2015-03-09 (×2): qty 1

## 2015-03-09 MED ORDER — LISINOPRIL 10 MG PO TABS
10.0000 mg | ORAL_TABLET | Freq: Every day | ORAL | Status: DC
  Administered 2015-03-10 – 2015-03-11 (×2): 10 mg via ORAL
  Filled 2015-03-09 (×2): qty 1

## 2015-03-09 MED ORDER — ISOSORBIDE MONONITRATE ER 30 MG PO TB24
30.0000 mg | ORAL_TABLET | Freq: Two times a day (BID) | ORAL | Status: DC
  Administered 2015-03-10 – 2015-03-12 (×5): 30 mg via ORAL
  Filled 2015-03-09 (×5): qty 1

## 2015-03-09 MED ORDER — ACETAMINOPHEN 325 MG PO TABS
650.0000 mg | ORAL_TABLET | ORAL | Status: DC | PRN
Start: 2015-03-10 — End: 2015-03-12
  Administered 2015-03-11: 650 mg via ORAL
  Filled 2015-03-09: qty 2

## 2015-03-09 MED ORDER — INSULIN DETEMIR 100 UNIT/ML ~~LOC~~ SOLN: 14.0000 [IU] | Freq: Every day | SUBCUTANEOUS | Status: DC

## 2015-03-09 MED ORDER — INSULIN ASPART 100 UNIT/ML ~~LOC~~ SOLN: 4.0000 [IU] | Freq: Three times a day (TID) | SUBCUTANEOUS | Status: DC

## 2015-03-09 MED ORDER — INSULIN ASPART 100 UNIT/ML ~~LOC~~ SOLN
4.0000 [IU] | Freq: Three times a day (TID) | SUBCUTANEOUS | Status: DC
  Administered 2015-03-10 – 2015-03-12 (×7): 4 [IU] via SUBCUTANEOUS
  Filled 2015-03-09 (×7): qty 4

## 2015-03-09 MED ORDER — ZOLPIDEM TARTRATE 5 MG PO TABS
5.0000 mg | ORAL_TABLET | Freq: Every evening | ORAL | Status: DC | PRN
  Administered 2015-03-10 – 2015-03-11 (×2): 5 mg via ORAL
  Filled 2015-03-09 (×2): qty 1

## 2015-03-09 MED ORDER — INSULIN DETEMIR 100 UNIT/ML ~~LOC~~ SOLN
14.0000 [IU] | Freq: Every day | SUBCUTANEOUS | Status: DC
  Administered 2015-03-11: 14 [IU] via SUBCUTANEOUS
  Filled 2015-03-09 (×3): qty 0.14

## 2015-03-09 MED ORDER — AMITRIPTYLINE HCL 25 MG PO TABS
25.0000 mg | ORAL_TABLET | Freq: Every day | ORAL | Status: DC
  Administered 2015-03-10 – 2015-03-11 (×2): 25 mg via ORAL
  Filled 2015-03-09 (×2): qty 1

## 2015-03-09 MED ORDER — LEVOTHYROXINE SODIUM 75 MCG PO TABS
75.0000 ug | ORAL_TABLET | Freq: Every day | ORAL | Status: DC
  Administered 2015-03-10 – 2015-03-12 (×3): 75 ug via ORAL
  Filled 2015-03-09 (×2): qty 1

## 2015-03-09 MED ORDER — AMOXICILLIN-POT CLAVULANATE 500-125 MG PO TABS
1.0000 | ORAL_TABLET | ORAL | Status: DC
  Administered 2015-03-10 – 2015-03-12 (×3): 500 mg via ORAL
  Filled 2015-03-09 (×2): qty 1

## 2015-03-09 MED ORDER — INSULIN ASPART 100 UNIT/ML ~~LOC~~ SOLN
0.0000 [IU] | Freq: Three times a day (TID) | SUBCUTANEOUS | Status: DC
  Administered 2015-03-10 (×2): 3 [IU] via SUBCUTANEOUS
  Administered 2015-03-11: 2 [IU] via SUBCUTANEOUS
  Administered 2015-03-11: 1 [IU] via SUBCUTANEOUS
  Administered 2015-03-11: 08:00:00 3 [IU] via SUBCUTANEOUS
  Administered 2015-03-11 – 2015-03-12 (×2): 1 [IU] via SUBCUTANEOUS
  Filled 2015-03-09: qty 2
  Filled 2015-03-09: qty 1
  Filled 2015-03-09: qty 3
  Filled 2015-03-09 (×2): qty 1
  Filled 2015-03-09: qty 3

## 2015-03-09 MED ORDER — AMOXICILLIN-POT CLAVULANATE 500-125 MG PO TABS: 1.0000 | ORAL_TABLET | ORAL | Status: DC

## 2015-03-10 ENCOUNTER — Inpatient Hospital Stay: Payer: Medicare Other

## 2015-03-10 LAB — GLUCOSE, CAPILLARY
Glucose-Capillary: 285 mg/dL — ABNORMAL HIGH (ref 70–99)
Glucose-Capillary: 254 mg/dL — ABNORMAL HIGH (ref 70–99)
Glucose-Capillary: 147 mg/dL — ABNORMAL HIGH (ref 70–99)
Glucose-Capillary: 145 mg/dL — ABNORMAL HIGH (ref 70–99)
Glucose-Capillary: 120 mg/dL — ABNORMAL HIGH (ref 70–99)

## 2015-03-10 LAB — HEMOGLOBIN: HEMOGLOBIN: 9.6 g/dL — AB (ref 13.0–18.0)

## 2015-03-10 MED ORDER — SODIUM CHLORIDE 0.9 % IV SOLN: 100.0000 mL | INTRAVENOUS | Status: DC | PRN

## 2015-03-10 MED ORDER — ALTEPLASE 2 MG IJ SOLR
2.0000 mg | Freq: Once | INTRAMUSCULAR | Status: AC | PRN
  Filled 2015-03-10: qty 2

## 2015-03-10 MED ORDER — PENTAFLUOROPROP-TETRAFLUOROETH EX AERO
1.0000 | INHALATION_SPRAY | CUTANEOUS | Status: DC | PRN
Start: 2015-03-10 — End: 2015-03-12
  Filled 2015-03-10: qty 30

## 2015-03-10 MED ORDER — LIDOCAINE-PRILOCAINE 2.5-2.5 % EX CREA
1.0000 "application " | TOPICAL_CREAM | CUTANEOUS | Status: DC | PRN
  Filled 2015-03-10: qty 5

## 2015-03-10 MED ORDER — LIDOCAINE HCL (PF) 1 % IJ SOLN
5.0000 mL | INTRAMUSCULAR | Status: DC | PRN
  Filled 2015-03-10: qty 5

## 2015-03-10 MED ORDER — HEPARIN SODIUM (PORCINE) 1000 UNIT/ML DIALYSIS
1000.0000 [IU] | INTRAMUSCULAR | Status: DC | PRN
  Filled 2015-03-10: qty 1

## 2015-03-10 MED ORDER — ALUM & MAG HYDROXIDE-SIMETH 200-200-20 MG/5ML PO SUSP
30.0000 mL | Freq: Four times a day (QID) | ORAL | Status: DC | PRN
  Administered 2015-03-10 – 2015-03-12 (×5): 30 mL via ORAL
  Filled 2015-03-10 (×5): qty 30

## 2015-03-10 NOTE — Progress Notes (Signed)
Nurse told, today pt had some blood leak around his penrose  Drain from his penis ans scrotum- applied dressing, no more bleeding. Will monitor overnight tonight.

## 2015-03-10 NOTE — H&P (Signed)
PATIENT NAME:  Taylor [name], Taylor [name] MR#:  [id number] DATE OF BIRTH:  1971-03-20  DATE OF ADMISSION:  02/20/2015  PRIMARY CARE PHYSICIAN: Located at the skilled nursing facility at [facility].   CHIEF COMPLAINT: Unresponsiveness.   HISTORY OF PRESENT ILLNESS: This is a 44 year old male who presents to the Emergency Room from a skilled nursing facility, as he was found unresponsive. Upon EMS arrival, the patient's blood sugars were significantly low, in the 20s to 30s. He was given dextrose and brought to the ER for further evaluation. In the Emergency Room, the patient's blood sugars had improved, although he was complaining of some vague pain in his pelvic area. A CT scan of the abdomen and pelvis showed a scrotal abscess. The patient was recently hospitalized at [facility] about 2 weeks ago for the same scrotal abscess and had it drained. He was then sent to rehabilitation, but it seems like the infection has recurred. He presented to the hospital with hypothermia and hypoglycemia, and presented with sepsis. Hospitalist services were contacted for further treatment and evaluation.   REVIEW OF SYSTEMS:  CONSTITUTIONAL: No documented fever. No weight gain, no weight loss.  EYES: No blurred or double vision.  EARS, NOSE, AND THROAT: No tinnitus. No postnasal drip. No redness of the oropharynx.  RESPIRATORY: No cough, no wheeze, no hemoptysis, no dyspnea.  CARDIOVASCULAR: No chest pain, no orthopnea, no palpitations, no syncope.  GASTROINTESTINAL: No nausea or vomiting. No diarrhea. No abdominal pain. No melena or hematochezia.  GENITOURINARY: No dysuria or hematuria.  ENDOCRINE: No polyuria or nocturia. No heat or cold intolerance.  HEMATOLOGIC: No anemia, no bruising, no bleeding.  INTEGUMENTARY: No rashes. No lesions.  MUSCULOSKELETAL: No arthritis, no swelling, no gout. NEUROLOGIC: No numbness, no tingling, no ataxia. No seizure-type activity.  PSYCHIATRIC: Positive anxiety. No insomnia.  No ADD.   PAST MEDICAL HISTORY: Consistent with end-stage renal disease on hemodialysis, hypertension, secondary hyperparathyroidism, hypothyroidism, diabetes, history of scrotal abscess.   ALLERGIES: AUGMENTIN AND RIFAMPIN.   SOCIAL HISTORY: No smoking. Occasional alcohol use, but no illicit drug abuse. Lives at [facility].   FAMILY HISTORY: The patient's mother and father are both alive. Mother is diabetic. Father is healthy.   CURRENT MEDICATIONS: As follows: Tylenol 650 q. 6 hours as needed, Elavil 25 mg at bedtime, aspirin 81 mg daily, calcium carbonate 500 mg 1-2 tabs b.i.d. as needed, Coreg 25 mg b.i.d., Keflex 500 mg q. 6 hours x 17 days, clonidine 0.1 mg b.i.d., Crestor 20 mg daily, fish oil 1000 mg daily, Imdur 30 mg b.i.d., Lasix 80 mg b.i.d., Levemir 20 units at bedtime, NovoLog 4 units t.i.d. with meals, NovoLog FlexPen per sliding scale, oxycodone 5 mg q. 4 hours as needed, Paxil 20 mg daily, promethazine 25 mg q. 4 hours as needed, Protonix 40 mg b.i.d., Renvela 800 mg t.i.d. with meals, Sensipar 60 mg daily, Synthroid 75 mcg daily, Xanax 0.25 mg q. 8 hours as needed, Zofran 4 mg b.i.d., Sarna 0.5/0.5 topical lotion to be applied t.i.d. as needed for itching.   PHYSICAL EXAMINATION: Presently, is as follows:  VITAL SIGNS: Temperature 96.4, pulse 67, respirations 18, blood pressure 138/71, saturations 100% on 2 liters nasal cannula.  GENERAL: He is a pleasant-appearing male, lethargic but in no apparent distress.  HEAD, EYES, EARS, NOSE AND THROAT: He is atraumatic, normocephalic. His extraocular muscles are intact. Pupils are equal and reactive to light. Sclerae anicteric. No conjunctival injection, no pharyngeal erythema.  NECK: Supple. There is no jugular venous  distention. No bruits, no lymphadenopathy, no thyromegaly.  HEART: Regular rate and rhythm. No murmurs, rubs, no clicks.  LUNGS: Clear to auscultation bilaterally. No rales, no rhonchi, no wheezes.  ABDOMEN: Soft,  flat, nontender, nondistended. Has good bowel sounds. No hepatosplenomegaly appreciated.  EXTREMITIES: No evidence of any cyanosis or clubbing. He does have +1 to 2 pitting edema from the knees to the ankles, bilaterally. He has multiple amputations on both feet.  SKIN: Moist and warm with no rashes.  LYMPHATIC: There is no cervical or axillary lymphadenopathy.  NEUROLOGICAL: The patient is alert, awake, and oriented x 3, with no focal motor or sensory deficits appreciated bilaterally. The patient has a left upper extremity AV fistula with good bruit and good thrill.  GENITOURINARY: The patient has a chronic indwelling Foley. He also has a foul-smelling drainage from the right side of his scrotum, which is inflamed and tender to touch.   LABORATORY DATA: Serum glucose 113, BUN 53, creatinine 6.3, sodium 134, potassium 5.6, chloride 91, bicarbonate 34. LFTs within normal limits. Troponin 0.04. White cell count of 13.6, hemoglobin 10.6, hematocrit 33.8, platelet count 269,000.   The patient did undergo a CT scan of the chest, abdomen, and pelvis, which showed bilateral pleural effusions, right greater than the left; moderate ascites throughout the peritoneal cavity; focal scrotal abscess extending into the base of the penis, transverse dimensions are roughly 1.5 to 3 cm.   ASSESSMENT AND PLAN: This is a 44 year old male with a history of end-stage renal disease on hemodialysis, hypertension, secondary hyperparathyroidism, hypothyroidism, diabetes, history of scrotal abscess who, presents to the hospital, as he was found unresponsive and noted to be hypoglycemic.   1. Altered mental status. This is likely metabolic encephalopathy from hypoglycemia. The patient has been given dextrose and blood sugars have improved, and the patient's mental status is now back to baseline.  2. Sepsis. I suspect the patient's presenting symptom was hyperthermia. With the elevated white cell count and CT scan findings  suggestive of a scrotal abscess, I will treat the patient with broad-spectrum IV antibiotics with vancomycin and Zosyn. Follow wound cultures. We will get a urology consult. I discussed the case with Dr. Apolinar Junes, who will evaluate the patient for possible incision and drainage.  3. Scrotal abscess. This was likely the cause of the patient's sepsis. As mentioned, I will continue IV vancomycin and Zosyn, empirically. We will get a urology consult. The patient to be read by Dr. Apolinar Junes. The patient has been seen by urology recently, about 2 weeks ago, and had the abscess drained.  4. End-stage renal disease on hemodialysis. The patient missed dialysis yesterday, as he was not feeling well. He normally gets dialysis Tuesday, Thursday, Saturday. I will get a nephrology consult.  5. Secondary hyperparathyroidism. Continue Renvela.  6. Diabetes. Since patient was presented with hypoglycemia, I will just place him on sliding scale insulin and hold his scheduled insulin for now.  7. Hypothyroidism. Continue Synthroid.  8. Hypertension. Presently, hemodynamically stable. Continue Coreg, clonidine, and Imdur.  9. Anxiety. Continue with Paxil and Xanax.  10. Hyperkalemia. This is mild, probably related to his end-stage renal disease. Should improve as the patient gets hemodialysis, and we will follow his potassium.   CODE STATUS: The patient is a full code.   TIME SPENT ON ADMISSION: 50 minutes     ____________________________ Rolly Pancake. Cherlynn Kaiser, MD vjs:mw D: 02/20/2015 10:22:39 ET T: 02/20/2015 11:08:04 ET JOB#: [id number]  cc: Rolly Pancake. Cherlynn Kaiser, MD, <Dictator> Houston Siren MD  ELECTRONICALLY SIGNED 02/27/2015 11:36

## 2015-03-10 NOTE — Discharge Summary (Signed)
PATIENT NAME:  Taylor [name], Taylor [name] MR#:  [id number] DATE OF BIRTH:  01-05-1971  DATE OF ADMISSION:  02/20/2015 DATE OF DISCHARGE:  02/28/2015  ADDENDUM:   Medication changes: Augmentin will be 875 mg twice a day for 21 days, Carafate 1 gram oral suspension before meals and at bedtime for acid reflux, and Lantus was titrated up to 10 units subcutaneous injection at bedtime since the patient is eating better.   We are waiting for decision on contested discharge yesterday. The patient will have dialysis today and likely be discharged after that to the facility. I explained to the patient followup is the key here. Dr. Sampson Goon will see him before antibiotics is up to determine length of therapy and whether or not he is improving. Needs urological followup because more extensive procedure may need to be done in the future.   TIME SPENT ON DISCHARGE: 30 minutes.  ____________________________ Herschell Dimes. Renae Gloss, MD rjw:[id number] D: 02/28/2015 08:03:41 ET T: 02/28/2015 08:17:14 ET JOB#: [id number]  cc: Herschell Dimes. Renae Gloss, MD, <Dictator> Salley Scarlet MD ELECTRONICALLY SIGNED 03/01/2015 15:43

## 2015-03-10 NOTE — Care Management Note (Signed)
Case Management Note  Patient Details  Name: Taylor [name] MRN: [id number] Date of Birth: Mar 03, 1971  Subjective/Objective:                    Action/Plan: All required paperwork faxed for IM Appeal by Mr Kissinger.  Expected Discharge Date:   undecided               Expected Discharge Plan:     In-House Referral:     Discharge planning Services     Post Acute Care Choice:    Choice offered to:     DME Arranged:    DME Agency:     HH Arranged:    HH Agency:     Status of Service:     Medicare Important Message Given:    Date Medicare IM Given:    Medicare IM give by:    Date Additional Medicare IM Given:    Additional Medicare Important Message give by:     If discussed at Long Length of Stay Meetings, dates discussed:    Additional Comments:  Sharay Bellissimo A, RN 03/10/2015, 3:22 PM

## 2015-03-10 NOTE — Consult Note (Addendum)
PATIENT NAME:  Taylor [name], REGGIO MR#:  [id number] DATE OF BIRTH:  [birth year]  DATE OF CONSULTATION:  02/26/2015  REFERRING PHYSICIAN:  Alford Highland,  MD CONSULTING PHYSICIAN:  Stann Mainland. Sampson Goon, MD  REASON FOR CONSULTATION:  Penile and scrotal abscess.   HISTORY OF PRESENT ILLNESS:  This is a 44 year old gentleman with history of brittle, poorly controlled diabetes, end-stage renal disease on dialysis, coronary artery disease, who was admitted  April 13 after he was found unresponsive at his nursing home.  He was very hypoglycemic and hypothermic.  It was also noted at that time he had significant purulent drainage from his scrotum and penile shaft.  Upon admission, the patient was seen by Dr. Apolinar Junes in neurology.  Apparently he had a penile prosthesis placed in September 2015.  Unfortunately this became infected and had to be removed.  Since around that time, in the fall of 2015, he has been dealing with chronic drainage from his penis and from scrotal abscesses,  He is followed at [facility] and most recently had an I and D 02/02/2015.  Apparently cultures grew Escherichia coli and he was treated with Keflex.  After admission,  the patient had CT scan that showed scrotal infection  up to  2 cm at the base of the penis.  He was taken to the operating room and on April 13 had penile and scrotal exploration and incision and drainage of penile and scrotal abscesses.  There was also placement of a suprapubic tube by Dr. Apolinar Junes.  We are consulted for further antibiotic management.   PAST MEDICAL HISTORY:  1.  End-stage renal disease.   2.  Brittle diabetes, most recent A1C of about 11 in October 2015.  3.  Hypothyroidism.  4.  Hypertension.  5.  Recurrent scrotal and penile abscesses.     PAST SURGICAL HISTORY:  He had placement and then removal of a penile prosthesis in the fall of 2015 in [place].     SOCIAL HISTORY:  Does not smoke.  Occasional alcohol.  Lives at [facility].    FAMILY HISTORY:  Noncontributory.   ALLERGIES: AUGMENTIN AND RIFAMPIN.  The Augmentin causes stomach upset.  REVIEW OF SYSTEMS: 11 systems reviewed and negative except as per HPI.    ANTIBIOTICS SINCE ADMISSION:  Unasyn begun April 17.  Prior to that, he was on ceftriaxone April 12 through April 17.  He also received one dose of clindamycin April 12.  He has been on metronidazole from April 14 through April 18.  He received Zosyn April 13 through April 15.   He also received vancomycin April 12.     PHYSICAL  EXAMINATION:   VITAL SIGNS:  Temperature 97.7, pulse 73, blood pressure 157/77, respirations 20, saturation 99% on room air.  GENERAL:  He is disheveled, chronically ill-appearing.  HEENT:  Pupils reactive.  Sclerae anicteric.  OROPHARYNX:  Poor dentition.  NECK:  Supple.  HEART:  Regular.  LUNGS:  Clear.  ABDOMEN:  Soft, nontender. GENITOURINARY:  He has had Penrose drain along the shaft of his penis as well as in his scrotum.  There is some purulent drainage.  There is some edema and erythema.  NEUROLOGIC:  He is alert and oriented but a relatively poor historian.   LABORATORY DATA:  White blood count on April 18 was 5.7.  Hemoglobin 9.3, platelets 137,000.  White count on admission, April 12 was 13.6.  Renal function is consistent with end-stage renal disease.  LFTs reveal a low albumin at  2.3 on April 19.    MICROBIOLOGY DATA:  Culture most recently from surgery April 13 shows Escherichia coli and Enterococcus faecalis.  The enterococcus was ampicillin and linezolid sensitive.  The Escherichia coli was sensitive to cefazolin, ceftriaxone, gentamicin, ceftazidime, imipenem and ertapenem.  It was resistant to Bactrim, levofloxacin and ciprofloxacin as well as ampicillin.  No results are available for amp sulbactam.  Prior culture March 17 from a penile swab grew Escherichia coli, Staphylococcus aureus and enterococcus.  The Escherichia coli had some similar sensitivities .  The  Staphylococcus aureus was methicillin sensitive but was resistant to clindamycin and erythromycin, otherwise pan sensitive.  The enterococcus was ampicillin sensitive.  Blood cultures April 13 were negative.  Scrotal culture November 8 grew light growth, coagulase-negative staph and an aerobic gram-positive rod.  Culture October 31 grew Candida albicans.    IMAGING STUDIES:  CT of the pelvis done April 13 showed small bilateral effusions,  moderate ascites throughout the peritoneal cavity, heterogeneous liver,  focal scrotal abscess 1.5 x 3 cm.   IMPRESSION:  A 44 year old with poorly controlled brittle diabetes, end-stage renal disease admitted with hyperglycemia and sepsis.  This is likely from a scrotal penile abscess.  He had been on Keflex.  He had had multiple interventions at [facility].  Cultures have grown methicillin sensitive Staphylococcus aureus, Escherichia coli, Enterococcus sensitive to ampicillin, coagulase- negative staph and Candida in the past, as well as gram-positive broad.  He is now status post I and D and placement of Penrose drains by Dr. Apolinar Junes April 13.  His past antibiotic history is rather unclear, but most recently he was on Keflex.   RECOMMENDATIONS:   1.  I would suggest a prolonged course of antibiotics directed broadly as he has grown multiple organisms and likely has a mixed anaerobic/aerobic infection.  He is intolerant of Augmentin which caused some stomach upset but may be willing to try it again.  2.  I would suggest we switch him to Augmentin 875 twice a day.  This will cover the Enterococcus, the Staphylococcus  aureus that was isolated in the past as well as any anaerobes that could be potentially involved.  However it will not cover the Escherichia coli which was resistant to ampicillin.  3.  For the Escherichia coli, I would suggest Keflex, renal dosing.  He has also grown Candida in the past and I would suggest adding fluconazole for short course as well.   Would discharge the patient on at least a 3-week course of these oral antibiotics.  4.  I will see him in followup in 2-3 weeks' time.  If he fails this therapy, then he likely would need treatment with IV antibiotics.  However, I am hopeful that with good source control, then prolonged broad-spectrum antibiotics, we may be able to heal this up.  This situation, however, is  complicated by his poorly controlled brittle diabetes.    Thank you for the consult.  I will be glad to follow with you.    ____________________________ Stann Mainland. Sampson Goon, MD [phone] D: 02/26/2015 20:57:28 ET T: 02/26/2015 21:36:33 ET JOB#: [id number]  cc: Stann Mainland. Sampson Goon, MD, <Dictator> Kensley Lares Sampson Goon MD ELECTRONICALLY SIGNED 03/12/2015 10:20

## 2015-03-10 NOTE — Consult Note (Signed)
Brief Consult Note: Diagnosis: Scrotal Abscess.   Discussed with Attending MD.   Comments: Patient is a 44yo male, with history of ESRD on HD and diabetes, who presented ED after being found unresponsive at nursing facility. He was found to have a significant purulent drainage from both scrotum and penis.  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  He's experienced chronic infections since.  He was taken to the OR yesterday for wound I & D, drainage and SPT placement. See operative note.  Patient doing well today. Wound checked and dressing changed. 2 penrose drains in place, scrotal and penile wounds without purulent drainage, wound bases pink, no signs of infection or significant tenderness, old wound packing removed and sites repacked using sterile iodiform gaze.  Will continue IV antibiotics, glucose control and HD managed by Medicine.  Recheck wound tomorrow..  Electronic Signatures: Claris Gladden (MD)   (Signed 15-Apr-16 06:59)  Co-Signer: Brief Consult Note Earlie Lou (NP)   (Signed 14-Apr-16 13:48)  Authored: Brief Consult Note  Last Updated: 15-Apr-16 06:59 by Claris Gladden (MD)

## 2015-03-10 NOTE — Op Note (Signed)
PATIENT NAME:  Taylor [name], Taylor [name] MR#:  [id number] DATE OF BIRTH:  [birth year]  DATE OF PROCEDURE:  02/20/2015  PREOPERATIVE DIAGNOSIS: Penile/scrotal abscess.   POSTOPERATIVE DIAGNOSES: Penile/scrotal abscess, urethrocutaneous fistula and glandular necrosis.  PROCEDURES: PERFORMED:  1. Penile and scrotal exploration.  2. Incision, drainage, and debridement of penile and scrotal abscess.  3. Wound washout 4. cystoscopy, suprapubic tube placement  ANESTHESIA: Spinal anesthesia.   ATTENDING SURGEON: Claris Gladden, MD   SPECIMENS: Scrotal abscess wound culture, necrotic penile and glandular tissue.   DRAINS: A 16 French suprapubic tube.   COMPLICATIONS: None.   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 He was last explored 2-3 weeks ago by Dr. Berneice Heinrich, at which time an abscess tract was noted extending from the scrotal abscess, down the length of the shaft of the penis. He was ultimately discharged to rehabilitation for wound care, but returns today after being found unresponsive, hypoglycemic, and purulent drainage from both the penis and scrotum and around his urethral Foley at the level of the meatus. He was seen and examined at the Emergency Room and counseled to undergo further exploration in the operating room, along with incision and drainage as needed, debridement, and cystoscopy to evaluate his urethra. Risks and benefits of the procedure were explained in detail to the patient, who agreed to proceed as planned.   PROCEDURE IN DETAIL: The patient was correctly identified in the preoperative holding area and informed consent was confirmed. He was brought to the operating suite and placed on the table in the supine position. At this time, universal timeout protocol was performed. All team  members were identified. Venodyne boots were placed, and the patient had already received IV vancomycin and Zosyn in the Emergency Room, each of which could not be re-dosed secondary to his end-stage renal disease. A spinal anesthetic was then placed without difficulty, and the patient was repositioned back in the supine position after tolerating this quite well. He was then prepped and draped in the standard surgical fashion. His previously chronic indwelling catheter had been removed prior to prepping and draping. Initially, close inspection of the various wounds were performed again. As per the consult note he had, a defect in the upper left aspect of his scrotum, a few centimeters in size, from which purulent drainage was necessitating. I was able to palpate a tract leaving from this abscess down the length of the shaft his penis subcutaneously, to another opening in the left lateral distal aspect of the penile shaft, just above the coronal margin. Again, a copious amount of purulent material was seen coming out of this site, as well. There were 2 sutures, which were previously in place and removed, one near site, which presumably had anchored a Penrose drain in place in the past. More concerning was purulent drainage from the urethral meatus. Significantly more pus was able to be expressed, both from the penile wound and from the urethral meatus, by compression of the glans in various areas. This is highly concerning for urethral cutaneous fistula or communicating sinus tracts between the distal penile wounds and urethral meatus. All of the purulent material was irrigated copiously away, at which time, a large amount of necrotic fibrinous material could be seen within the distal penile wound, concerning for glandular necrosis. Additionally, necrosis could be seen on the ventral aspect  of the penile shaft, and a partially eroded urethral meatus, all the way down to the coronal margin with necrotic glandular  tissue adjacent to this erosion. At this point in time, a flexible cystoscope was brought in, and the urethra was evaluated. The distal 3-4 cm of the urethra were noted to be very shaggy and friable, but proximal to this in the proximal penile urethra, membranous and prostatic urethra, the mucosa was relatively normal-appearing. The prostate was very short and widely patent. The bladder was also fairly normal, other than some catheter cystitis on the posterior wall. There are no bladder lesions noted, and the trigone was directly visualized without tumors or lesions. The scope was then carefully backed out of the urethra again, with careful reinspection at the very distal penile urethra, which again, was grossly abnormal. I then placed a gloved finger through the lateral penile wound and unfortunately, I could see my gloved finger, cystoscopically consistent with a urethrocutaneous fistula was fairly sizable, at least 0.5 cm or greater. The scope was removed at this point in time, and I did start debriding some necrotic cavernosal tissue on the lateral aspect of the penis and glans within the opening ending. This tissue was clearly nonviable, without any bleeding. During the piecewise debridement, I cut back within the cavernosal tissue until there was some bleeding noted, consistent with viable tissue. At the area of the urethral cutaneous fistula, I was very judicious and did not debride much in this area. I also debrided some necrotic fibrinous material on the ventral aspect of the glans, which clearly extended into the fossa navicularis, all of which was nonviable. Further inspection of the glans also revealed a punctate eschar on the right dorsal aspect concerning, for some evolving vascular necrosis. At this point in time, the debridement was deemed complete. All structures were copiously irrigated with antibiotic solution. Given the fairly profound urethrocutaneous fistula and glandular necrosis, as well as  recurrent penile/scrotal abscess, I did feel it is important, at this point in time, to place a suprapubic tube to divert his urine to allow for optimal wound healing, if possible. We had not previously received informed consent to do this procedure. I did speak with the patient's father, at this point in time, prior to performing any further procedure and explained the situation and the need for suprapubic catheter placement for the purpose of wound healing. We also discussed all the risks of the procedure, including risk of damage to surrounding structures, including bowel bleeding and infection. He understood all of these risks and given that the patient was not consent-able personally at this time, he agreed to serve as a proxy for the patient and did consent to the procedure. I then replaced the flexible scope back into the bladder and pointed towards the dome. A spinal needle was used to identify the location of the suprapubic tube site, just above the pubic symphysis in the midline, which was on the anterior dome of the bladder. The wound was instilled with 10 mL of additional local anesthetic. An approximately 1 cm long incision was then made in the skin, and the dissection was carried down to the fascia with Tresa Endo. I then used a metal suprapubic trocar and advanced it carefully under direct visualization into the predetermined location within the bladder. The trocar came in nicely. The obturator was removed and a 16 [place] Foley catheter was placed through this into the bladder, and the balloon was inflated with 10 mL of sterile water. The trocar  was then removed, leaving only the Foley in place. The Foley was secured to the patient's abdominal wall using 3-0 nylon x 2. Attention was then turned back to the wound, at which time the subcutaneous tunnel was then re-identified and a Penrose drain was placed through the scrotal abscess, up through the subcutaneous tissues to the distal penile abscess opening.  The SPT was loosely sutures were placed using nylon x 2. The scrotal wound was then packed with 1/4 inch packing tape, and the glandular wound was also packed with the same material. The patient was then cleaned and dried, an ABD pad was placed, as well as mesh panties. He was taken to the PACU in stable condition. There were no complications in this case.     ____________________________ Claris Gladden, MD ajb:[id number] D: 02/21/2015 14:22:40 ET T: 02/21/2015 15:01:53 ET JOB#: [id number]  cc: Claris Gladden, MD, <Dictator> Claris Gladden MD ELECTRONICALLY SIGNED 02/28/2015 14:49

## 2015-03-10 NOTE — Consult Note (Signed)
Chief Complaint:  Subjective/Chief Complaint Doing well today, eat breakfast x 2.  Stated that he "challenged" discharge today due to persistent chest pain/ GERD.   VITAL SIGNS/ANCILLARY NOTES: **Vital Signs.:   20-Apr-16 13:41  Vital Signs Type Routine  Temperature Temperature (F) 97.6  Celsius 36.4  Temperature Source oral  Pulse Pulse 73  Respirations Respirations 20  Systolic BP Systolic BP 158  Diastolic BP (mmHg) Diastolic BP (mmHg) 81  Mean BP 106  Pulse Ox % Pulse Ox % 100  Pulse Ox Activity Level  At rest  Oxygen Delivery Room Air/ 21 %  *Intake and Output.:   Daily 20-Apr-16 07:00  Grand Totals Intake:  515 Output:  450    Net:  65 24 Hr.:  65  Oral Intake      In:  480  IV (Secondary)      In:  35  Urine ml     Out:  450  Dialysis Fluid Removed (ml) ml     Out:  0  Length of Stay Totals Intake:  3475 Output:  4874    Net:  -1399   Brief Assessment:  GEN well developed, well nourished, no acute distress   Cardiac Regular  -- LE edema   Respiratory normal resp effort  no use of accessory muscles   Gastrointestinal Normal   Gastrointestinal details normal Soft  Nontender  Nondistended  SPT site c/d/i, draining yellow urine with scant debris   EXTR negative cyanosis/clubbing, negative edema, multiple toe ampulations   Additional Physical Exam Scrotal wound c/d/i, packing in place.  Minimal scrotal edema.  Penrose remains in place.  No signficant purulence today.  No ischemia noted.  Tissue appears viable.   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   ID consult recommending ammox and keflex.  Concerned that ultimately may need more aggessive debridement including partial penectomy if current management fails, ideally to be discussed further with Dr. Page Spiro (his  Urologist at [facility] in [place]).  OK for discharge back to SNF with Urology follow up with primary Urologist, will leave penrose in place to be removed per Dr. Page Spiro.   Plan -continue daily scrotal dressing by nursing with packing tape -home with penrose  -continue abx per ID -discharge with wound care per primary team -may ultimately need more aggresive surgery, TBD by Dr. Page Spiro as outpatient  Urology to continue to follow while inpatient   Electronic Signatures: Claris Gladden (MD)  (Signed 20-Apr-16 16:20)  Authored: Chief Complaint, VITAL SIGNS/ANCILLARY NOTES, Brief Assessment, Assessment/Plan   Last Updated: 20-Apr-16 16:20 by Claris Gladden (MD)

## 2015-03-10 NOTE — Consult Note (Signed)
Chief Complaint:  Subjective/Chief Complaint S/P drainage of penescrotal abscess.   He is improving with reduced malaise.  He remains afebrile.   ROS: -pain, -nausea.   VITAL SIGNS/ANCILLARY NOTES: **Vital Signs.:   17-Apr-16 05:00  Vital Signs Type Routine  Temperature Temperature (F) 97.9  Celsius 36.6  Temperature Source oral  Pulse Pulse 74  Respirations Respirations 20  Systolic BP Systolic BP 148  Diastolic BP (mmHg) Diastolic BP (mmHg) 68  Mean BP 94  Pulse Ox % Pulse Ox % 100  Pulse Ox Activity Level  At rest  Oxygen Delivery 2L   Brief Assessment:  GEN well developed, well nourished, no acute distress   Additional Physical Exam He has persistent, stable penile edema with a penrose in place.   Scrotal wound is granulating and was repacked with iodoform gauze.   He is getting some excoriation of the scrotal skin from the wound drainage.   Lab Results: Routine Micro:  17-Apr-16 12:29   Micro Text Report CLOSTRIDIUM DIFFICILE   C.DIFFICILE ANTIGEN       C.DIFFICILE GDH ANTIGEN : NEGATIVE   C.DIFFICILE TOXIN A/B     C.DIFFICILE TOXINS A AND B : NEGATIVE   INTERPRETATION            Negative for C. difficile.    ANTIBIOTIC                         Assessment/Plan:  Assessment/Plan:  Assessment S/P drainage of penoscrotal abscess with clinical improvement.   Plan Continue dressing changes.   Penrose removal per Dr. Apolinar Junes.   I will have some gauze placed over the packing to help keep the scrotal skin dry.   Electronic Signatures: Anner Crete (MD)  (Signed 17-Apr-16 15:01)  Authored: Chief Complaint, VITAL SIGNS/ANCILLARY NOTES, Brief Assessment, Lab Results, Assessment/Plan   Last Updated: 17-Apr-16 15:01 by Anner Crete (MD)

## 2015-03-10 NOTE — Consult Note (Signed)
Chief Complaint:  Subjective/Chief Complaint Patient with penile scrotal abcess s/p debridement/washout/I & D/cysto/SPT placement to divert urine-POD #6.  He denies scrotal pain.  He does c/o GERD, anxiety and an episode of vomiting last night.   VITAL SIGNS/ANCILLARY NOTES: **Vital Signs.:   19-Apr-16 04:26  Vital Signs Type Routine  *Intake and Output.:   Daily 19-Apr-16 07:00  Grand Totals Intake:  600 Output:  0    Net:  600 24 Hr.:  600  Oral Intake      In:  600  Urine ml     Out:  0  Length of Stay Totals Intake:  2960 Output:  4424    Net:  -1464   Brief Assessment:  GEN well developed, well nourished, no acute distress   Gastrointestinal details normal Soft  Nontender  Nondistended  SPT in place, site c/d/clear yellow urine   Additional Physical Exam GU:  penrose drain in place, wound packing is clean, no drainage expressed from the drain upon applying pressure, no scrotal edema or tenderness, no testicular tenderness, no erythema, tissue is viable,  mild edema in the coronal area of the penis   Lab Results: Lab:  18-Apr-16 13:00   O2 Saturation (Pulse Ox) 100  FiO2 (Pulse Ox) 0.21 (Result(s) reported on 25 Feb 2015 at 01:54PM.)   Assessment/Plan:  Assessment/Plan:  Assessment 44 y/o M w/ ESRD on HD, vasulopath, 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  Wound with scant purulent material per glans but viable.  Currently on ceftriaxone, metroniazole and vancomycin.   -if conservative management fails ?partial penectomy, to be discussed with his primary Urologist, Dr. Page Spiro -OK to discharge back to SNF with f/u with Dr. Page Spiro -continue daily scrotal dressing by nursing with packing tape -penrose removal TBD -continue antibiotics -discharge wtih would care per primary team   Electronic Signatures: Michiel Cowboy A (PA)  (Signed 19-Apr-16  07:51)  Authored: Chief Complaint, VITAL SIGNS/ANCILLARY NOTES, Brief Assessment, Lab Results, Assessment/Plan   Last Updated: 19-Apr-16 07:51 by Michiel Cowboy A (PA)

## 2015-03-10 NOTE — Consult Note (Signed)
Chief Complaint:  Subjective/Chief Complaint Doing well today, eat breakfast x 2.  He reports feeling quite well.  States he may feel ready for discharge soon.   VITAL SIGNS/ANCILLARY NOTES: **Vital Signs.:   21-Apr-16 04:57  Vital Signs Type Routine  Temperature Temperature (F) 97.6  Celsius 36.4  Temperature Source oral  Pulse Pulse 68  Respirations Respirations 20  Systolic BP Systolic BP 177  Diastolic BP (mmHg) Diastolic BP (mmHg) 71  Mean BP 95  Pulse Ox % Pulse Ox % 100  Pulse Ox Activity Level  At rest  Oxygen Delivery 2L  *Intake and Output.:   21-Apr-16 05:44  Grand Totals Intake:   Output:      Net:   24 Hr.:  360    06:03  Grand Totals Intake:   Output:  350    Net:  -350 24 Hr.:  10    Shift 07:00  Grand Totals Intake:   Output:  350    Net:  -350 24 Hr.:  10    Daily 07:00  Grand Totals Intake:  360 Output:  350    Net:  10 24 Hr.:  10    08:37  Grand Totals Intake:  0 Output:  0    Net:  0 24 Hr.:  0    09:00  Grand Totals Intake:  240 Output:      Net:  240 24 Hr.:  240    Shift 15:00  Grand Totals Intake:  240 Output:      Net:  240 24 Hr.:  240   Brief Assessment:  GEN well developed, well nourished, no acute distress   Cardiac Regular  -- LE edema   Respiratory normal resp effort  no use of accessory muscles   Gastrointestinal Normal   Gastrointestinal details normal Soft  Nontender  Nondistended  SPT site c/d/i, draining yellow urine with scant debris   EXTR negative cyanosis/clubbing, negative edema, multiple toe ampulations   Additional Physical Exam Scrotal wound c/d/i, packing in place.  Minimal scrotal edema.  Penrose remains in place.  No signficant purulence today.  No ischemia noted.  Tissue appears viable.   Lab Results:  Hepatic:  20-Apr-16 07:01   Albumin, Serum  2.4 (3.5-5.0 NOTE: New reference range  01/15/15)  Routine Chem:  20-Apr-16 07:01   Potassium, Serum  5.2 (3.5-5.1 NOTE: New Reference Range  01/15/15)  Phosphorus, Serum  1.3 (2.5-4.6 NOTE: New Reference Range  01/15/15)  Glucose, Serum  318 (65-99 NOTE: New Reference Range  01/15/15)  BUN  27 (6-20 NOTE: New Reference Range  01/15/15)  Creatinine (comp)  4.94 (0.61-1.24 NOTE: New Reference Range  01/15/15)  Sodium, Serum  133 (135-145 NOTE: New Reference Range  01/15/15)  Chloride, Serum  92 (101-111 NOTE: New Reference Range  01/15/15)  CO2, Serum 30 (22-32 NOTE: New Reference Range  01/15/15)  Calcium (Total), Serum  6.7 (8.9-10.3 NOTE: New Reference Range  01/15/15)  Anion Gap 11  eGFR (African American)  15  eGFR (Non-African American)  13 (eGFR values <75m/min/1.73 m2 may be an indication of chronic kidney disease (CKD). Calculated eGFR is useful in patients with stable renal function. The eGFR calculation will not be reliable in acutely ill patients when serum creatinine is changing rapidly. It is not useful in patients on dialysis. The eGFR calculation may not be applicable to patients at the low and high extremes of body sizes, pregnant women, and vegetarians.)  Result Comment - calcium  - mpg c/  tracy watson @ 1006 02/27/15  - RESULTS VERIFIED BY REPEAT TESTING  - NOTIFIED OF CRITICAL / READ-BACK PERFORMED  Result(s) reported on 27 Feb 2015 at 10:12AM.  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   ID consult recommending ammox and keflex.  Concerned that ultimately may need more aggessive debridement including partial penectomy if current management fails, ideally to be discussed further with Dr. Wilhemina Bonito (his Urologist at [facility] in [place]).  OK for discharge back to SNF with Urology follow up with primary Urologist, will leave penrose in place to be removed per Dr. Wilhemina Bonito.   Plan -continue daily scrotal dressing by nursing with packing tape -home with penrose  -continue abx per ID -discharge with wound care per primary team -may ultimately need more aggresive surgery, TBD by Dr. Wilhemina Bonito as outpatient  Urology to continue to follow while inpatient   Electronic Signatures: Sherlynn Stalls (MD)  (Signed 21-Apr-16 11:06)  Co-Signer: Chief Complaint, VITAL SIGNS/ANCILLARY NOTES, Brief Assessment, Lab Results, Assessment/Plan Herbert Moors (NP)  (Signed 21-Apr-16 11:52)  Authored: Chief Complaint, VITAL SIGNS/ANCILLARY NOTES, Brief Assessment, Lab Results, Assessment/Plan   Last Updated: 21-Apr-16 11:52 by Herbert Moors (NP)

## 2015-03-10 NOTE — Consult Note (Signed)
Chief Complaint:  Subjective/Chief Complaint Taylor [name] has nausea this morning but has no scrotal complaints.  ROS -fever, +malaise   VITAL SIGNS/ANCILLARY NOTES: **Vital Signs.:   16-Apr-16 05:35  Vital Signs Type Routine  Temperature Temperature (F) 97.5  Celsius 36.3  Temperature Source oral  Pulse Pulse 67  Respirations Respirations 20  Systolic BP Systolic BP 438  Diastolic BP (mmHg) Diastolic BP (mmHg) 62  Mean BP 78  Pulse Ox % Pulse Ox % 100  Pulse Ox Activity Level  At rest  Oxygen Delivery 2L  *Intake and Output.:   16-Apr-16 05:59  Grand Totals Intake:  240 Output:      Net:  240 24 Hr.:  960    Shift 07:00  Grand Totals Intake:  240 Output:      Net:  240 24 Hr.:  960    Daily 07:00  Grand Totals Intake:  960 Output:      Net:  381 84 Hr.:  960   Brief Assessment:  GEN well developed, well nourished, ill appearing and pale   Additional Physical Exam The SP tube is draining well. The penrose drain in the penis and scrotum is intact with a packing in place. There is mild edema of the penis and scrotum but no erythema or crepitus.   Lab Results: Routine Chem:  15-Apr-16 06:42   Glucose, Serum  195 (65-99 NOTE: New Reference Range  01/15/15)  BUN  30 (6-20 NOTE: New Reference Range  01/15/15)  Creatinine (comp)  4.43 (0.61-1.24 NOTE: New Reference Range  01/15/15)  Sodium, Serum 136 (135-145 NOTE: New Reference Range  01/15/15)  Potassium, Serum 4.4 (3.5-5.1 NOTE: New Reference Range  01/15/15)  Chloride, Serum  95 (101-111 NOTE: New Reference Range  01/15/15)  CO2, Serum 26 (22-32 NOTE: New Reference Range  01/15/15)  Calcium (Total), Serum  7.1 (8.9-10.3 NOTE: New Reference Range  01/15/15)  Anion Gap 15  eGFR (African American)  17  eGFR (Non-African American)  15 (eGFR values <51mL/min/1.73 m2 may be an indication of chronic kidney disease (CKD). Calculated eGFR is useful in patients with stable renal function. The eGFR  calculation will not be reliable in acutely ill patients when serum creatinine is changing rapidly. It is not useful in patients on dialysis. The eGFR calculation may not be applicable to patients at the low and high extremes of body sizes, pregnant women, and vegetarians.)  Routine Hem:  15-Apr-16 06:42   WBC (CBC) 7.1  RBC (CBC)  3.18  Hemoglobin (CBC)  9.2  Hematocrit (CBC)  29.5  Platelet Count (CBC) 152  MCV 93  MCH 28.9  MCHC  31.2  RDW  18.5  Neutrophil % 79.4  Lymphocyte % 9.3  Monocyte % 7.3  Eosinophil % 2.0  Basophil % 2.0  Neutrophil # 5.7  Lymphocyte #  0.7  Monocyte # 0.5  Eosinophil # 0.1  Basophil # 0.1 (Result(s) reported on 22 Feb 2015 at 07:58AM.)   Assessment/Plan:  Assessment/Plan:  Assessment S/P drainage of  penoscrotal abscess for urethral cutaneous fistula.  He is afebrile and the wound looks good.   Plan Daily dressing change order placed.   Electronic Signatures: Malka So (MD)  (Signed 16-Apr-16 07:08)  Authored: Chief Complaint, VITAL SIGNS/ANCILLARY NOTES, Brief Assessment, Lab Results, Assessment/Plan   Last Updated: 16-Apr-16 07:08 by Malka So (MD)

## 2015-03-10 NOTE — H&P (Addendum)
PATIENT NAME:  Taylor [name], Taylor [name] MR#:  [id number] DATE OF BIRTH:  06-May-1971  DATE OF ADMISSION:  03/03/2015  ADDENDUM  (Please add this as an addendum to document #[id number]  ASSESSMENT AND PLAN:  A 44 year old Caucasian male who came into the Emergency Department with a chief complaint of weakness and shortness of breath. He was found to be in diabetic ketoacidosis. He has history of end-stage renal disease, on hemodialysis. Currently, he is with pulmonary edema and hyperkalemic, but maintaining his saturations and pulse oximetry is 95% to 90% on 2 liters.  1.  Acute respiratory distress with shortness of breath and pulmonary edema with history of end-stage renal disease:  We will admit him to Intensive Care Unit. The patient is currently with metabolic acidosis from diabetic ketoacidosis. We will monitor him closely. Discussed with nephrology regarding the pulmonary edema. They will do hemodialysis first thing in the morning. We will put him on BiPAP if needed.  2.  Diabetic ketoacidosis with anion gap and low bicarbonate:  The patient has received 1 liter of intravenous fluids in the Emergency Department. We will continue insulin drip. In view of end-stage renal disease and pulmonary edema, we will hold off on the fluids. We will repeat BMP every 6 hours and monitor his electrolytes closely. With insulin drip, potassium should go down. The patient was given 1 dose of Kayexalate.  3.  End-stage renal disease with hyperkalemia:  The patient will get dialysis in the morning. Denies any chest pain. No EKG changes.  4.  Pseudohyponatremia secondary to diabetic ketoacidosis:  Continue insulin drip. We will put a consult to endocrinology, [facility].  His hyponatremia should be corrected when hyperglycemia is controlled with insulin drip. We will monitor electrolytes closely. The patient is mentating okay at this point of time.  5.  History of hypertension:  We will monitor blood pressure closely and resume  his home medications.  6.  History of coronary artery disease:  The patient denies any chest pain at this time. We will provide him gastrointestinal  and deep vein thrombosis prophylaxis.   CONDITION:  Critical.  Plan of care discussed with the patient.   TOTAL CRITICAL CARE TIME SPENT: 50 minutes.   CODE STATUS: He is FULL CODE. His father is the medical power of attorney.  ____________________________ Ramonita Lab, MD ag:TT D: 03/03/2015 19:24:00 ET T: 03/03/2015 21:39:55 ET JOB#: [id number]  cc: Ramonita Lab, MD, <Dictator> Primary Care Physician A. Wendall Mola, MD Dr. Shanon Ace MD ELECTRONICALLY SIGNED 03/20/2015 14:27

## 2015-03-10 NOTE — Consult Note (Signed)
Chief Complaint:  Subjective/Chief Complaint complaining of GERD.  no fevers.  ID consulting today.   VITAL SIGNS/ANCILLARY NOTES: **Vital Signs.:   18-Apr-16 12:28  Vital Signs Type Routine  Temperature Temperature (F) 97.6  Celsius 36.4  Temperature Source oral  Pulse Pulse 71  Respirations Respirations 20  Systolic BP Systolic BP 517  Diastolic BP (mmHg) Diastolic BP (mmHg) 79  Mean BP 101  Pulse Ox % Pulse Ox % 100  Pulse Ox Activity Level  At rest  Oxygen Delivery Room Air/ 21 %  *Intake and Output.:   Daily 18-Apr-16 07:00  Grand Totals Intake:  480 Output:  250    Net:  230 24 Hr.:  230  Oral Intake      In:  480  Urine ml     Out:  250  Length of Stay Totals Intake:  2360 Output:  4424    Net:  -2064   Brief Assessment:  GEN well developed, well nourished, no acute distress   Cardiac Regular  -- LE edema   Respiratory normal resp effort  no use of accessory muscles   Gastrointestinal Normal   Gastrointestinal details normal Soft  Nontender  Nondistended  SPT site c/d/i, draining yellow urine with scant debris   EXTR negative cyanosis/clubbing, negative edema, multiple toe ampulations   Additional Physical Exam Scrotal wound c/d/i, packing in place.  Minimal scrotal edema.  Penrose remains in place.  Small amount of purulent material able to be expressed from penile wound glans with squeezing of penile shaft (as on previous days).  No ischemia noted.  Tissue appears viable.   Lab Results: Routine Chem:  18-Apr-16 04:22   Glucose, Serum  164 (65-99 NOTE: New Reference Range  01/15/15)  BUN  24 (6-20 NOTE: New Reference Range  01/15/15)  Creatinine (comp)  4.40 (0.61-1.24 NOTE: New Reference Range  01/15/15)  Sodium, Serum  134 (135-145 NOTE: New Reference Range  01/15/15)  Potassium, Serum 4.4 (3.5-5.1 NOTE: New Reference Range  01/15/15)  Chloride, Serum  93 (101-111 NOTE: New Reference Range  01/15/15)  CO2, Serum 32 (22-32 NOTE: New  Reference Range  01/15/15)  Calcium (Total), Serum  6.8 (8.9-10.3 NOTE: New Reference Range  01/15/15)  Anion Gap 9  eGFR (African American)  17  eGFR (Non-African American)  15 (eGFR values <8m/min/1.73 m2 may be an indication of chronic kidney disease (CKD). Calculated eGFR is useful in patients with stable renal function. The eGFR calculation will not be reliable in acutely ill patients when serum creatinine is changing rapidly. It is not useful in patients on dialysis. The eGFR calculation may not be applicable to patients at the low and high extremes of body sizes, pregnant women, and vegetarians.)  Result Comment - CALCIUM  - C/MELISSA COBB AT 0524 02/25/15.PMH  - RESULTS VERIFIED BY REPEAT TESTING  - NOTIFIED OF CRITICAL / READ-BACK PERFORMED  Result(s) reported on 25 Feb 2015 at 05:36AM.  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  Wound with additional purulent material per glans again today but viable Contintue conservative management with wound care, abx.  Agree with ID consult.  Concerned that ultimately may need more aggessive debridement including partial penectomy if current management fails, ideally to be discussed further with Dr. Wilhemina Bonito (his Urologist at [facility] in [place]).  OK for discharge back to SNF with Urology follow up with primary Urologist.   Plan  -continue daily scrotal dressing by nursing with packing tape -penrose will ultimately need removal, timing TBD -continue IV ceftiraxone, ID consult -discharge with wound care per primary team -may ultimately need more aggresive surgery, TBD by Dr. Wilhemina Bonito as outpatient  Urology to continue to follow while inpatient   Electronic Signatures: Sherlynn Stalls (MD)  (Signed 18-Apr-16 13:37)  Authored: Chief Complaint, VITAL SIGNS/ANCILLARY NOTES, Brief Assessment, Lab Results, Assessment/Plan   Last Updated: 18-Apr-16 13:37 by Sherlynn Stalls (MD)

## 2015-03-10 NOTE — Consult Note (Signed)
PATIENT NAME:  Taylor [name], Taylor [name] MR#:  [id number] DATE OF BIRTH:  [birth year]  ENDOCRINOLOGY CONSULTATION  DATE OF CONSULTATION:  03/04/2015.  REFERRING PHYSICIAN:   Delfino Lovett, MD.  CONSULTING PHYSICIAN:  A. Wendall Mola, MD.  CHIEF COMPLAINT:  Diabetic ketoacidosis and type 1 diabetes mellitus.   HISTORY OF PRESENT ILLNESS:  This is a 44 year old male admitted yesterday from [facility] nursing facility with diabetic ketoacidosis.  The patient had been discharged from this facility 2 days prior after hospitalization with a scrotal abscess.  At the time of that hospital discharge, medications included in discharge summary were Lantus insulin 8 units at bedtime and regular insulin sliding scale only, 4 units if blood sugar 201-250 plus an additional 2 units per 50 over that target.  It is not clear what insulin was given while at [facility].  A printout from [facility] includes Lantus 6 units at bedtime, without any prandial insulin or sliding scale; however, I am not clear that is accurate.  On presenting to the ED yesterday, blood sugar was greater than 600, bicarbonate was low at 17, and anion gap was elevated at 22.  He was initiated on IV fluids and IV insulin and had rapid improvement in blood sugars.  By last evening, sugars were in the 200s and IV insulin was stopped.  Dextrose was never added to the IV fluids.  After IV insulin was stopped, he became hypoglycemic at 7:20 a.m.  This was treated with juice and then 1 amp of D50 with good response.  Currently only insulin ordered is a NovoLog insulin sliding scale.   The patient has type 1 diabetes, diagnosed at age 70.  Diabetes is complicated by end-stage renal disease, on hemodialysis; peripheral neuropathy; prior amputations of toes bilaterally; and retinopathy.  States he follows with Dr. Elvera Lennox, endocrinology in [place], however, has not had a followup there in over 6 months.  Recalls that home insulin regimen had been  Levemir 20 units at bedtime and Humalog 3-4 units before meals plus a sliding scale.  Current hemoglobin A1c is 9.4%, and diabetes is chronically uncontrolled.   PAST MEDICAL HISTORY:  1. Type 1 diabetes mellitus.  2. Diabetic nephropathy.  3. End-stage renal disease, on hemodialysis.  4. Secondary hyperparathyroidism.  5. Anemia of chronic disease.  6. Hypothyroidism.  7. Hypertension.  8. Anxiety disorder/depression.  9. Hyperlipidemia.  10.  GERD.  11.  Scrotal and penile abscess, status post penile implant, I and D March 2016 showing Escherichia coli.   CURRENT MEDICATIONS:  1. Amitriptyline 25 mg at bedtime.  2. Augmentin 500 mg daily.  3. Clonidine 0.1 mg b.i.d.  4. Diflucan 100 mg daily.  5. Imdur 30 mg b.i.d.  6. Levothyroxine 75 mcg daily.  7. Paxil 20 mg daily.  8. Lisinopril 10 mg daily.  9. Aspirin 81 mg daily.  10.  NovoLog insulin sliding scale.   SOCIAL HISTORY:  The patient prior to this hospitalization was residing at [facility]. Family lives locally.  Has lived with parents in past.  No tobacco use.   FAMILY HISTORY:  Mother also with diabetes mellitus.   ALLERGIES:  RIFAMPIN CAUSES NAUSEA, VOMITING, AND DIARRHEA.   REVIEW OF SYSTEMS:  GENERAL:  No weight loss.  No fever.  HEENT:  Has had blurred vision.  Denies sore throat.  NECK:  No neck pain or dysphagia.  CARDIAC:  No chest pain or palpitation.  PULMONARY:  No cough or shortness of breath.  ABDOMEN:  No  abdominal pain.  Fair appetite.  EXTREMITIES:  Does have numbness and paresthesias in the lower extremities. ENDOCRINE:  No heat or cold intolerance.  GENITOURINARY:  On dialysis.  SKIN:  No recent rash or skin changes.  MUSCULOSKELETAL:  No peripheral edema.  Has had prior amputations bilaterally of toes.   PHYSICAL EXAMINATION:  VITAL SIGNS:  Height 75 inches, weight 204 pounds, BMI 25.6.  Temperature 97.2, pulse 73, respirations 21, BP 144/74, pulse oximetry 100% O2 saturation on 2 liters  O2.  GENERAL:  Well-developed white male, appears chronically ill.  HEENT:  EOMI.  Oropharynx is clear.  NECK:  Supple.  No thyromegaly.  No neck tenderness.  CARDIAC:  Regular rate and rhythm without murmur.  PULMONARY:  Clear to auscultation bilaterally.  ABDOMEN:  Diffusely soft, nontender, nondistended.  EXTREMITIES:  No peripheral edema is present.  Amputation of all toes on right foot and great toe on left foot.  SKIN:  Bilateral barefoot exam shows stump sites clean, no ulcerations.  Calluses present on both feet without induration or tenderness.  NEUROLOGIC:  No dysarthria.  PSYCHIATRIC:  Alert and oriented, calm, cooperative.   LABORATORY DATA:  Glucose 164, BUN 31, creatinine 4.44, sodium 130, potassium 5.6.  TSH 31.87.   ASSESSMENT:  1. Type 1 diabetes, uncontrolled.  2. Diabetic nephropathy with end-stage renal disease, on hemodialysis.  3. Diabetic peripheral neuropathy.  4. Prior lower extremity toe amputations.  5. Diabetic retinopathy.  6. Uncontrolled hypothyroidism.  7. Prior diabetic ketoacidosis, now resolved.   RECOMMENDATIONS:  1. As the patient has type 1 diabetes, he has a lifelong need of basal-bolus insulin.  His regimen at home had been Levemir once daily and Humalog at meals.  Typically recommend we try to minimize changes and brand names of insulin to minimize confusion.  If possible, continue these insulins.  However, hospital does not carry Humalog at this time, therefore will be replaced with NovoLog.  2. Start Levemir 20 units daily, give at bedtime.  3. Start Humalog 4 units t.i.d. a.c.  Give 5-10 minutes before eating.  4. NovoLog sliding scale for correction only.  Again, this should never be the only insulin that he receives.  Patient fairly sensitive to insulin; therefore, will give 1 unit for every 50 mg/dL that blood sugar is over a target of 150.  5. Nutritionist and clinical diabetes educators will meet with the patient.  Appreciate their  assistance.  6. Monitor blood sugars q.a.c. and at bedtime.  7. Will request MAR and blood sugar log from [facility] to further evaluate what prompted this episode of DKA.  8. Hypothyroidism, uncontrolled.  May need to adjust dose of levothyroxine; however, it is not clear to me he has been consistently receiving it.  With all his various medical procedures lately as well as being in various locations, this may have been missed.  Will review the MAR from [facility] before adjusting his dose.  Hopefully, we can obtain that later today.   Thank you for the kind request for consultation.  I will follow along with you.    ____________________________ A. Wendall Mola, MD ams:kc D: 03/04/2015 17:10:03 ET T: 03/04/2015 17:45:38 ET JOB#: [id number]  cc: A. Wendall Mola, MD, <Dictator> Dr. Elvera Lennox, Endocrinology in [place] A. Wendall Mola MD ELECTRONICALLY SIGNED 03/07/2015 10:37

## 2015-03-10 NOTE — H&P (Addendum)
PATIENT NAME:  Taylor [name], Taylor [name] MR#:  [id number] DATE OF BIRTH:  Jul 21, 1971  DATE OF ADMISSION:  03/03/2015  PRIMARY CARE PHYSICIAN:  Nonlocal.    REFERRING EMERGENCY DEPARTMENT PHYSICIAN:  Charlestine Night. Scotty Court, MD     CHIEF COMPLAINT:  Generalized weakness and shortness of breath.   HISTORY OF PRESENT ILLNESS:  The patient is a 44 year old Caucasian male with a history of end-stage renal disease on hemodialysis on Tuesday, Thursday, Saturday; last hemodialysis yesterday who was sent over from [facility] with a chief complaint of shortness of breath. The patient is reporting that he has been not feeling well and decreased appetite for the past few days, and today he is a very short of breath. The patient was sent over to the ED. Chest x-ray reveals pulmonary edema. His BMP shows blood sugar at 600 and his potassium is high at 6.5. ED physician has given him 1 liter of IV fluid bolus and also the patient is started on insulin drip for DKA. In view of hyperkalemia, the patient has received sodium bicarbonate and calcium gluconate. During my examination, the patient is resting comfortably, but complaining of weakness, but shortness of breath is better. Denies any complaints of chest pain or dizziness.   PAST MEDICAL HISTORY:  End-stage renal disease on hemodialysis on Tuesday, Thursday, Saturday, diabetes mellitus, hypothyroidism, hypertension, secondary hyperparathyroidism.  PAST SURGICAL HISTORY:  Right foot amputation, AV fistula.   ALLERGIES: NKDA    PSYCHOSOCIAL HISTORY:  Residing at [facility]. No history of smoking, alcohol, or illicit drug usage.   FAMILY HISTORY:  Mother has a history of diabetes mellitus.   HOME MEDICATIONS:  Tylenol 500 mg 2 tablets p.o. q. 6 hours as needed for pain, alprazolam 0.25 mg 1 tablet p.o. every 6 hours as needed for anxiety, amitriptyline 25 mg p.o. once daily at bedtime, aspirin 81 mg enteric coated p.o. once daily, calcium  carbonate 500 mg 2 tablets p.o. 4 times a day as needed for indigestion and heartburn, Coreg 25 mg p.o. 2 times a day, Keflex 250 mg 1 capsule p.o. every 12 hours for 21 days, clonidine 0.1 mg 1 tablet p.o. 2 times a day, Crestor 20 mg once daily at bedtime, fish oil 1000 mg 1 capsule p.o. once daily, fluconazole 1 tablet p.o. once daily for 14 days, pantoprazole 40 mg p.o. once daily, paroxetine 20 mg once daily, promethazine 25 mg p.o. every 4 hours as needed for nausea and vomiting, lisinopril 10 mg p.o. once daily, levothyroxine 75 mcg p.o. once daily, Lantus 6 units subcutaneously once daily at bedtime, isosorbide mononitrate 30 mg p.o. 2 times a day.    REVIEW OF SYSTEMS:   CONSTITUTIONAL:  Complaining of fatigue and weakness.  EYES:  Denies blurry vision, double vision.  EARS, NOSE, AND THROAT:  Denies epistaxis or discharge.  RESPIRATORY:  Complaining of shortness of breath. Has chronic congestive heart failure.  CARDIOVASCULAR:  No chest pain or palpitations.  GASTROINTESTINAL:  Denies nausea, vomiting, diarrhea. Complaining of decreased appetite.  GENITOURINARY:  No dysuria or hematuria.  ENDOCRINE:  Denies polyuria or nocturia. Has hypothyroidism and insulin-requiring diabetes mellitus.  HEMATOLOGIC AND LYMPHATIC:  Has chronic anemia from end-stage renal disease. INTEGUMENTARY:  Denies any rash or lesions.   MUSCULOSKELETAL:  No joint pain in the neck and back.  NEUROLOGIC:  Denies any vertigo or ataxia.  PSYCHIATRIC:  Flat mood and affect.   PHYSICAL EXAMINATION:  VITAL SIGNS:  Temperature 97.5, pulse 81, respirations 18 to 22,  blood pressure 147/64, pulse oximetry 100% on 2 liters.  GENERAL APPEARANCE:  Not in acute distress, but looks weak and sick.  HEENT:  Normocephalic, atraumatic. Pupils are equal, reacting to light and accommodation. No scleral icterus. No conjunctival injection. No sinus tenderness. The patient looks edematous.  NECK:  Supple. No JVD. No thyromegaly. Range of  motion is intact.  LUNGS:  Positive rales and rhonchi. No wheezing.  CARDIOVASCULAR:  S1, S2 normal. Regular rate and rhythm.  GASTROINTESTINAL:  Soft. Bowel sounds are positive in all 4 quadrants. Nontender, nondistended. No masses. Suprapubic catheter.   NEUROLOGIC:  Awake and alert, but somewhat lethargic, but answers all questions appropriately. Reflexes are 2+.    EXTREMITIES:  Right foot amputation.   SKIN:  Multiple excoriations were noticed on the bilateral lower extremities. Normal turgor.  PSYCHIATRIC:  Flat mood and affect.   LABORATORY DATA AND IMAGING STUDIES:  Initial Accu-Chek, blood sugar was greater than 600. On BMP, glucose is 675, BUN 29, creatinine 4.36, sodium 125, potassium 6.5, chloride 86, CO2 of 17, anion gap is 22, GFR 18, calcium is 6.7. Total protein 6.3, albumin 2.6, total bilirubin is 1,  AST 16, ALT 12. Troponin 0.03. WBC 7.5, hemoglobin 10.2, hematocrit is 33.3, platelets are 116,000.  Urinalysis:  Yellow in color, clear in appearance, glucose greater than 500, nitrite negative, leukocyte esterase 1+.  Chest x-ray, portable, single view, with congestive heart failure with edema and small pleural effusions bilaterally. A 12-lead EKG:  Sinus rhythm at 78 beats per minute, prolonged PR interval at 202, possible left atrial enlargement, right bundle branch block, no acute ST wave changes.    I will dictate assessment and plan in the next document.   ____________________________ Ramonita Lab, MD ag:kc D: 03/03/2015 18:42:00 ET T: 03/03/2015 19:42:32 ET JOB#: [id number]  cc: Ramonita Lab, MD, <Dictator> Ramonita Lab MD ELECTRONICALLY SIGNED 03/20/2015 14:27

## 2015-03-10 NOTE — Consult Note (Signed)
PATIENT NAME:  Taylor [name], Taylor [name] MR#:  [id number] DATE OF BIRTH:  [birth year]  DATE OF CONSULTATION:  02/20/2015.  REQUESTING PHYSICIAN:  Dr. Cherlynn Kaiser.   CONSULTANT:  Vanna Scotland of urology.    LOCATION OF CONSULTATION:  Emergency Room.    REASON FOR CONSULTATION:  Scrotal abscess, sepsis.   HISTORY OF PRESENT ILLNESS:  This is a 44 year old male with a complex GU history who presented to the Emergency Room today after being found unresponsive at his skilled nursing facility.  He was profoundly hypoglycemic but also noted to be hypothermic and have significant purulent drainage both from his scrotum and penile shaft.  Urology was consulted for further assistance with management of his scrotal abscess.   The patient does have a history of an infected penile prosthesis placed in approximately September 2015 by Dr. Patsi Sears of [facility] in [place].  Unfortunately, the prosthesis became infected within a month of placement and it was subsequently removed.  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 Berneice Heinrich at which time abscess cavity and tract were identified, tracking from the left hemiscrotum up the length of the subcutaneous tissues of the penis to the left distal penile shaft.  A Penrose drain was placed through this tract, and he was ultimately discharged for wound management to a skilled nursing facility.  His cultures from that admission grew Escherichia coli sensitive to cephalosporins, including Keflex and ceftriaxone as well as gentamicin, but resistant to Cipro and Bactrim.   Today in the Emergency Room, a CT scan does show significant scrotal collection measuring up to 2 cm extending to the base of the penis.  He also was noted to have significant leukocytosis, 13.6.  He was afebrile and normotensive.    PAST MEDICAL HISTORY:   1. End-stage renal disease, on  hemodialysis.  2. Diabetes.  3. Erectile dysfunction.  4. Hypertension.  5. Hypothyroidism.  6. Secondary hyperparathyroidism.   ALLERGIES:  AUGMENTIN AND RIFAMPIN.     SOCIAL HISTORY:  Nonsmoker, occasional alcohol, but no illicit drugs.  He currently lives at  [facility] skilled nursing facility.   FAMILY HISTORY:  His parents are both living.  Mother is a diabetic.  Father is healthy.   REVIEW OF SYSTEMS:  A 12-point review of systems was performed and is otherwise negative other than as per HPI.  He also complains of some anxiety and lethargy.   CURRENT MEDICATIONS:  Please see admission H and P.   PHYSICAL EXAMINATION:   VITAL SIGNS:  Temperature is 96.4.  Pulse is 70. Blood pressure is 146/80. Respirations 20. Oxygen saturation 100% on 2 liters nasal cannula.    GENERAL:  No acute distress.  Alert and oriented x3.  HEENT:  Normocephalic, atraumatic.  Moist mucous membranes.  NECK:  Supple.  No masses.  Trachea is midline.  HEART:  Regular rate and rhythm.  LUNGS:  Clear to auscultation.  No increased work of breathing or accessory muscle use.  ABDOMEN:  Soft, nontender, nondistended.  No rebound or guarding.  SKIN:  No rashes, lesions, or significant bruises.  EXTREMITIES:  Moving all four extremities, 1+ lower extremity edema bilaterally.  Multiple digit amputations noted.   LYMPHADENOPATHY:  No cervical or inguinal lymphadenopathy.  NEUROLOGICALLY:  Grossly intact.  No focal deficits.  Cranial nerves intact.  GENITOURINARY:  Foley catheter in place draining clear yellow urine.  There is purulent  drainage coming around the catheter at the level of the urethral meatus.  The penile shaft is somewhat firm to palpation and there is a suture in the left distal lateral aspect of the penile shaft skin with an approximately 1 cm defect adjacent to this from which there is copious purulent drainage.  There is increased drainage with manipulation of the penis.  He also is noted to have  a left scrotal defect with a few sutures in this area.  The defect itself is packed with purulent-covered gauze, approximately a 3 cm defect.  There is slight erythema around the scrotal defect and some mild scrotal edema but no crepitus.  It appears that these two draining tracts likely communicate.  Perineum is inspected and is noted to be intact without tenderness, fluctuance, or erythema.  No crepitus.   LABORATORY DATA:  See electronic medical record.  Pertinent labs include WBC of 13.6 as well as creatinine of 3.6.   CT of the chest, abdomen and pelvis revealed bilateral pleural effusions, moderate ascites throughout the peritoneal cavity, as well as a scrotal abscess extending from the base of the penis with dimensions measuring 3 to 1.5 cm squared.  There is no gas or air noted within the abscess cavity.   ASSESSMENT AND PLAN:  This is a 44 year old male with multiple medical problems including end-stage renal disease and diabetes who has recurrent scrotal and penile abscesses following infected penile prosthesis more than six months ago.  It appears that he has reaccumulated another abscess but there is no real concern for Fournier gangrene both on examination as well as on CT scan findings.  I have couciled him to return to the operating room today for a penoscrotal exploration, I and D of abscess, washout, as well as for cystoscopy to evaluate the urethra, to which he was agreeable.  He has been admitted to the medical service for management of his other multiple medical comorbidities.   RECOMMENDATIONS:  1. To OR for I and D, drainage.  2. Wound management to be determined following OR.  3. Agree with IV antibiotics.  Previously grew Escherichia coli sensitive to ceftriaxone, Keflex, and gentamicin; resistant to Cipro and Bactrim.  He is currently on vancomycin and Zosyn which likely can be narrowed down once cultures are resulted.  4. Tight glucose control for wound management.   Please page  with any questions or concerns.  Will continue to follow this patient.   I have discussed this patient both with the admitting physician as well as with Dr. Berneice Heinrich and Dr. Patsi Sears from [facility], both of whom know the patient well.     ____________________________ Claris Gladden, MD ajb:kc D: 02/20/2015 18:12:55 ET T: 02/20/2015 19:20:22 ET JOB#: [id number]  cc: Claris Gladden, MD, <Dictator> Claris Gladden MD ELECTRONICALLY SIGNED 02/28/2015 14:47

## 2015-03-10 NOTE — Discharge Summary (Signed)
PATIENT NAME:  Taylor [name], Taylor [name] MR#:  [id number] DATE OF BIRTH:  04/07/1971  DATE OF ADMISSION:  02/20/2015 DATE OF DISCHARGE:  03/01/2015  ADDENDUM   PRIMARY CARE PHYSICIAN AT FACILITY: [name], MD  The patient has appealed his discharge that was on February 27, 2015. Lost the appeal and will be going out to the rehab today. The patient continues to ask for another day in the hospital. Still complains of acid reflux. Carafate was added previously. Medical management at this time. The patient is eating very well. The only change overnight, I did cut back the Lantus to 8 units subcutaneous injection at bedtime because he did have a sugar of 68 this morning. His sugars have been very labile depending on how much he is eating.  TIME SPENT ON DISCHARGE: 20 minutes.  ____________________________ Herschell Dimes. Renae Gloss, MD rjw:sb D: 03/01/2015 08:29:08 ET T: 03/01/2015 08:47:06 ET JOB#: [id number]  cc: Herschell Dimes. Renae Gloss, MD, <Dictator> Bryn Gulling. [name], MD Salley Scarlet MD ELECTRONICALLY SIGNED 03/01/2015 15:43

## 2015-03-10 NOTE — Plan of Care (Signed)
Problem: Discharge Progression Outcomes Goal: Discharge plan in place and appropriate Outcome: Progressing Pt prefers to be called Taylor [name], had some penile bleeding from penile drain at shift change in am MD aware

## 2015-03-10 NOTE — Consult Note (Signed)
Brief Consult Note: Diagnosis: scrotal/ penile abscess.   Recommend to proceed with surgery or procedure.   Orders entered.   Discussed with Attending MD.   Comments: 44 yo M with history of infected penile prothesis (anaerobic) s/p removal with chronic scrotal abscess most recently s/p I&P at [facility] few weeks ago.  Found unresponsive at rehab/ nursing home now admitted with concern for sepsis and purulent drainage from penile shaft and scrotum.  Recommend exloration, I&D, wound wash out, cysto in OR this afternoon.  Will continue to follow closely.  Dictated consult note to follow.  Electronic Signatures: Claris Gladden (MD)  (Signed 13-Apr-16 14:49)  Authored: Brief Consult Note   Last Updated: 13-Apr-16 14:49 by Claris Gladden (MD)

## 2015-03-10 NOTE — Consult Note (Signed)
Chief Complaint:  Subjective/Chief Complaint patient seen by Urology early this AM, scrotal packing changed.  SPT draining well, minimal UOP but on HD.  Wound cultures growing E. coli (as previously, failed PO keflex as outpt), switched to ceftriaxone today.  No fevers, chills.  HD stable.  WBC normalized.   VITAL SIGNS/ANCILLARY NOTES: **Vital Signs.:   15-Apr-16 05:14  Vital Signs Type Routine  Temperature Temperature (F) 98.2  Celsius 36.7  Temperature Source oral  Pulse Pulse 71  Respirations Respirations 20  Systolic BP Systolic BP 268  Diastolic BP (mmHg) Diastolic BP (mmHg) 62  Mean BP 81  Pulse Ox % Pulse Ox % 100  Pulse Ox Activity Level  At rest  Oxygen Delivery 2L  *Intake and Output.:   Daily 15-Apr-16 07:00  Grand Totals Intake:  770 Output:  375    Net:  395 24 Hr.:  395  Oral Intake      In:  720  IV (Secondary)      In:  50  Urine ml     Out:  125  Emesis ml     Out:  250  Length of Stay Totals Intake:  920 Output:  2625    Net:  -1705   Brief Assessment:  GEN well developed, well nourished, no acute distress   Cardiac Regular  -- LE edema   Respiratory normal resp effort  no use of accessory muscles   Gastrointestinal Normal   Gastrointestinal details normal Soft  Nontender  Nondistended  SPT site c/d/i, draining yellow urine with scant debris   EXTR negative cyanosis/clubbing, negative edema, multiple toe ampulations   Additional Physical Exam Scrotal wound c/d/i, packing changed.  Minimal scrotal edema.  Penrose remains in place.  Small amount of purulent material able to be expressed from penile wound/ glans with squeezing of penile shaft.  No ischemia noted.  Tissue appears viable.   Lab Results: Routine Chem:  15-Apr-16 06:42   Glucose, Serum  195 (65-99 NOTE: New Reference Range  01/15/15)  BUN  30 (6-20 NOTE: New Reference Range  01/15/15)  Creatinine (comp)  4.43 (0.61-1.24 NOTE: New Reference Range  01/15/15)  Sodium, Serum 136  (135-145 NOTE: New Reference Range  01/15/15)  Potassium, Serum 4.4 (3.5-5.1 NOTE: New Reference Range  01/15/15)  Chloride, Serum  95 (101-111 NOTE: New Reference Range  01/15/15)  CO2, Serum 26 (22-32 NOTE: New Reference Range  01/15/15)  Calcium (Total), Serum  7.1 (8.9-10.3 NOTE: New Reference Range  01/15/15)  Anion Gap 15  eGFR (African American)  17  eGFR (Non-African American)  15 (eGFR values <21mL/min/1.73 m2 may be an indication of chronic kidney disease (CKD). Calculated eGFR is useful in patients with stable renal function. The eGFR calculation will not be reliable in acutely ill patients when serum creatinine is changing rapidly. It is not useful in patients on dialysis. The eGFR calculation may not be applicable to patients at the low and high extremes of body sizes, pregnant women, and vegetarians.)  Routine Hem:  15-Apr-16 06:42   WBC (CBC) 7.1  RBC (CBC)  3.18  Hemoglobin (CBC)  9.2  Hematocrit (CBC)  29.5  Platelet Count (CBC) 152  MCV 93  MCH 28.9  MCHC  31.2  RDW  18.5  Neutrophil % 79.4  Lymphocyte % 9.3  Monocyte % 7.3  Eosinophil % 2.0  Basophil % 2.0  Neutrophil # 5.7  Lymphocyte #  0.7  Monocyte # 0.5  Eosinophil # 0.1  Basophil #  0.1 (Result(s) reported on 22 Feb 2015 at 07:58AM.)   Assessment/Plan:  Assessment/Plan:  Assessment 44 yo M w/ ESRD on HD, vasulopath, DM with chronic penile/ scrotal abscess and new urethrocuteneous fistula POD 2 s/p debridement/ washout/ I&D/ cysto/ SPT placement to divert urine.  Wound with additional purulent material per glans this AM.  We discussed this AM that we will contintue conservative management with wound care, abx.  Concerned that ultimately may need more aggessive debridement including partial penectomy if current management fails, ideally to be discussed further with Dr. Wilhemina Bonito (his Urologist at [facility] in [place]).   Plan -continue daily scrotal wet to dry -penrose will  ultimately need removal, timing TBD -continue IV ceftiraxone, previously failed PO kelfex -discharge with wound care per primary team -may ultimately need more aggresive surgery, TBD by Dr. Wilhemina Bonito as outpatient  Urology to continue to follow while inpatient   Electronic Signatures: Sherlynn Stalls (MD)  (Signed 15-Apr-16 17:57)  Authored: Chief Complaint, VITAL SIGNS/ANCILLARY NOTES, Brief Assessment, Lab Results, Assessment/Plan   Last Updated: 15-Apr-16 17:57 by Sherlynn Stalls (MD)

## 2015-03-10 NOTE — Progress Notes (Signed)
For prior documentation please see [facility] clinical manager MRN# [id number]

## 2015-03-10 NOTE — Discharge Summary (Signed)
PATIENT NAME:  Taylor [name], Taylor [name] MR#:  [id number] DATE OF BIRTH:  Jul 12, 1971  DATE OF ADMISSION:  02/20/2015 DATE OF DISCHARGE:  02/27/2015  PRIMARY CARE PHYSICIAN: [name], MD  UROLOGIST: Dr. Patsi Sears in [place]   INFECTIOUS DISEASE: Dr. Sampson Goon   FINAL DIAGNOSES: 1.  Clinical sepsis, scrotal and penile abscess.  2.  Hypoglycemia with diabetes.  3.  Gastroesophageal reflux disease.  4.  End-stage renal disease on hemodialysis Tuesday, Thursday, Saturday.  5.  Hypertension.  6.  Hyperlipidemia.  7.  Depression.  8.  Hypothyroidism.  9.  Chronic pain.   MEDICATIONS ON DISCHARGE: Include Crestor 20 mg at bedtime, Coreg 25 mg twice a day, Imdur 30 mg twice a day, clonidine 0.1 mg twice a day, amitriptyline 25 mg at bedtime, promethazine 25 mg every 4 hours as needed for nausea and vomiting, acetaminophen 500 mg 2 tablets every 6 hours as needed for pain, aspirin 81 mg daily, fish oil 1000 mg daily, Paxil 20 mg daily, Protonix 40 mg twice a day, Synthroid 75 mcg in the morning, oxycodone 5 mg every 4 hours as needed for pain, calcium carbonate 500 mg 2 tablets 4 times a day as needed for indigestion and heartburn, Xanax 0.5 mg every 6 hours as needed for anxiety, cephalexin 250 mg every 12 hours for 21 days, lisinopril 10 mg daily, Maalox 30 mL every 4 hours as needed for heartburn, Lantus 6 units subcutaneous injection at bedtime, Humulin R sliding scale before meals and at bedtime, fluconazole 100 mg once a day for 14 days, Augmentin 500/125 one tablet at bedtime for 21 days.   DIET: Renal.   DISCHARGE DIET: Low-sodium, carbohydrate-controlled diet, regular consistency.   ACTIVITY: As tolerated.   FOLLOWUP: With Dr. Patsi Sears, urology, 1 to 2 weeks, Dr. Sampson Goon, infectious disease, 2 to 3 weeks, physical therapy at rehabilitation dialysis Tuesday, Thursday, Saturday, [employer]. Please follow up with doctor at rehabilitation in 1 to 2 days.   HOSPITAL COURSE:  The patient was admitted 02/20/2015 and discharged 02/27/2015. Came in unresponsive, likely from hypoglycemia, clinical sepsis with scrotal abscess, started on vancomycin and Zosyn. Dr. Apolinar Junes took to the Operating Room for drainage and suprapubic catheter placement.   LABORATORY AND RADIOLOGICAL DATA DURING THE HOSPITAL COURSE: Included pathology from the surgery that showed necrotic soft tissue with suppurative inflammation. Troponin borderline at 0.04. Glucose 113, BUN 53, creatinine 6.32, sodium 134, potassium 5.6, chloride 91, CO2 of 34, calcium 7.9. Liver function tests normal range, albumin low at 2.5, lipase 18. White blood cell count 13.6, hemoglobin and hematocrit 10.6 and 33.8, platelet count of 269,000. Blood cultures were negative. Urinalysis greater than 500 mg/dL of protein and glucose. CT scan of the chest, abdomen, and pelvis with contrast showed bilateral pleural effusion, right greater than left, moderate ascites. Liver has hepatic parenchymal disease, focal scrotal abscess extending to the base of the penis 1.5 x 3 cm. The wound culture grew out light growth of Escherichia coli, heavy growth of enterococcus, multiple anaerobes. Parathyroid hormone 128. Stool for Clostridium difficile negative. Last platelet count 116,000. Last sodium 129. Last potassium 5.2. Pulse oximetry room air 100%. Last white count 5.7. Last hemoglobin 9.3.   HOSPITAL COURSE PER PROBLEM LIST:  1.  For the patient's clinical sepsis, scrotal and penile abscess, the patient was taken urgently to the Operating Room by Dr. Apolinar Junes; did a suprapubic catheter to divert urine out a different pathway, debridement, and Penrose drains as per Dr. Apolinar Junes. Close clinical followup as  outpatient with Dr. Patsi Sears in [place]. The goal is to try to avoid penectomy. The patient has been battling this infection for 8 months. Infectious disease consultation with Dr. Sampson Goon, recommended another 3 weeks of oral antibiotics at this  point and follow up with him as outpatient.  2.  Hypoglycemia with diabetes. The patient's sugars have been variable even in the hospital. I got him on low dose Lantus 6 units with sliding scale.  3.  Gastroesophageal reflux disease. He is on Maalox and Protonix.  4.  End-stage renal disease, on hemodialysis Tuesday, Thursday, Saturday. Follow up with nephrology at [facility]: Lambert Rhonda W.  5.  Hypertension, essential, on numerous medications.  6.  Hyperlipidemia, on Crestor.  7.  Depression and anxiety, on amitriptyline, Paxil, and Xanax.  8.  Hypothyroidism, unspecified, on Synthroid.   9.  Chronic pain, on oxycodone.   TIME SPENT ON DISCHARGE: 35 minutes.    ____________________________ Herschell Dimes. Renae Gloss, MD rjw:at D: 02/27/2015 09:08:10 ET T: 02/27/2015 10:10:09 ET JOB#: [id number]  cc: Herschell Dimes. Renae Gloss, MD, <Dictator> [name], MD Dr. Conan Bowens P. Sampson Goon, MD [facility]    Salley Scarlet MD ELECTRONICALLY SIGNED 02/27/2015 12:52

## 2015-03-10 NOTE — Progress Notes (Signed)
SUBJECTIVE:  Review of Systems:  Subjective/Chief Complaint C/o nausea last night and said his abdomen is distended. No nausea this morning.   Fever/Chills No   Cough No   Sputum No   Abdominal Pain No   Diarrhea No   Constipation No   Nausea/Vomiting No   SOB/DOE No   Chest Pain No   Dysuria No   Tolerating Diet Yes   Medications/Allergies Reviewed Medications/Allergies reviewed   VITAL SIGNS/ANCILLARY NOTES: **Vital Signs.: Filed Vitals:   03/10/15 0953  BP: 149/73  Pulse: 79  Temp: 98.6 F (37 C)  Resp:     Physical Exam:  GEN no acute distress   HEENT pink conjunctivae, PERRL, hearing intact to voice, moist oral mucosa   NECK supple  No masses  thyroid not tender   RESP normal resp effort  clear BS  no use of accessory muscles   CARD regular rate  murmur present  no carotid bruits  LE edema present  +1-2 edema b/l   VASCULAR ACCESS AV fistula present  Good bruit  Good thrill   ABD denies tenderness  soft  normal BS   Distended no Abdominal Bruits  no Adominal Mass   EXTR negative cyanosis/clubbing, positive edema, +1-2 edema b/l amputated toes.   SKIN normal to palpation, No rashes, No ulcers, Penhose drain in the penis from recent I & D of abscess.   NEURO cranial nerves intact, follows commands, motor/sensory function intact   PSYCH alert, A+O to time, place, person   ASSESSMENT/PLAN:  Assessment/Plan:  Orders/Results reviewed Orders reviewed and updated, Laboratory results reviewed, Radiology results reviewed, Vital Signs reviewed, Nursing documentation reviewed   Assessment/Plan A 44 year old Caucasian male who came into the Emergency Department with a chief complaint of weakness and shortness of breath. He was found to be in diabetic ketoacidosis. He has history of end-stage renal disease, on hemodialysis.  1.  Acute respiratory distress - due to volume overload from Pulm edema.  - improved w/ HD. -Non compliant with fluid  restriction - had HD for last 3 days daily.  2. Diabetic ketoacidosis - due to medical non-compliance.  - resolved. -Appreciate endocrine help very high blood sugars , and then hypoglycemia- appreciated help of endocrine. - re-adjusted levemir dose. now Blood sugar under control.  3.  End-stage renal disease with hyperkalemia:   - resolved w/ HD.   4. Pseudohyponatremia -  due to hyperglycemia.  - improved as sugars have corrected.   5. HTN - accelerated.  - cont. Lisinopril, Clonidine, Imdur.   6. Abnormal TSH: seen by Endocrine and no acute issue.  - cont. synthroid for now. TSH high due to non compliance- follow after 4-5 weeks.  7. Scrotal penile abscess: appreciate ID input - on keflex, augmentin, Fluconazole for at least 2 weeks.  - afebrile, wound looks good.  Follow up w/ ID as outpatient.  8. Nausea and abdominal distension   Has good Bowel sounds and able to tolerate breakfast this morning.   May be distension is due to Fluid.  Will check Xray KUB, If no obstruction- he can be discharged.    (continued) d/c today afternoon to rehab.   DVT Prophylaxis Heparin subcutaneous   Discharge Planning SNF/LTC   Code Status Full Code   Critical Care no, Total patient care time:, 35 min   Case Discussed With patient, clinical social worker, nursing   >50% of visit spent in couseling/coordination of care yes, discharge plan.

## 2015-03-10 NOTE — Progress Notes (Signed)
Subjective: Pt seen at bedside.  Still having some shortness of breath. Had HD yesterday.   Objective: Vital signs in last 24 hours: Temp:  [97.8 F (36.6 C)-98.6 F (37 C)] 97.9 F (36.6 C) (05/01 1306) Pulse Rate:  [79-84] 84 (05/01 1306) Resp:  [0-18] 0 (05/01 0903) BP: (149-180)/(69-86) 171/84 mmHg (05/01 1309) SpO2:  [99 %-100 %] 100 % (05/01 1306) Weight change:   Intake/Output from previous day: 04/30 0701 - 05/01 0700 In: -  Out: 350 [Urine:350] Intake/Output this shift: Total I/O In: 600 [P.O.:600] Out: -   General appearance: chronically ill appearing HEENT: Atraumatic; EOMI, hearing intact, OM moist Neck: Trachea midline; supple Lungs: basilar rales, with normal respiratory effort  CV: S1S2, no obvious rub Abdomen: Soft, mild distension, BS present Extremities: 1+ b/l LE edema Skin: Warm and dry, no rashes noted. Neuro/Psych: Awake, alert, following commands, normal mood    Lab Results:  Recent Labs  03/10/15 0452  HGB 9.6*   BMET:  Recent Labs  03/08/15 0425  CO2 30  BUN 30*  CREATININE 3.30*  CALCIUM 7.8*   No results for input(s): PTH in the last 72 hours. Iron Studies: No results for input(s): IRON, TIBC, TRANSFERRIN, FERRITIN in the last 72 hours.  Studies/Results: Dg Abd 1 View  03/10/2015   CLINICAL DATA:  Abdominal distention.  EXAM: ABDOMEN - 1 VIEW  COMPARISON:  CT on 02/20/2015  FINDINGS: No evidence of dilated bowel loops. Abdominal and pelvic vascular calcification noted. No definite radiopaque calculi identified. Small pleural effusions seen bilaterally, left side greater than right.  IMPRESSION: Normal bowel gas pattern.  Small pleural effusions, left side greater than right.   Electronically Signed   By: Myles Rosenthal M.D.   On: 03/10/2015 14:15    I have reviewed the patient's current medications.  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  1.  ESRD on HD:  Pt had HD yesterday, will plan for HD again tomorrow per pt request.  2.  Anemia of CKD: last hgb 9.6 at the moment, continue epogen with HD.  3.  SHPTH: last phos 2.4, pt not on binders at the moment. 4.  Penile/scrotal abscess with urtherocutaneous fistual: s/p extensive debridement with SPT placement. f/u with Urology 5.  Hyperkalemia: normalized with HD. 6.  Pulmonary edema: Has had consecutive days of dialysis, improved clinically, will plan for additional HD tomorrow.   LOS: 7 days   Leveda Kendrix 03/10/2015,2:42 PM

## 2015-03-10 NOTE — Progress Notes (Signed)
CSW following for pt return to [facility] at d/c. Pt has appealed his d/c. RNCM aware. CSW will follow. Dellie Burns, MSW, LCSW [phone] (weekend coverage)

## 2015-03-10 NOTE — Plan of Care (Signed)
Problem: Discharge Progression Outcomes Goal: Other Discharge Outcomes/Goals Outcome: Progressing Patient VSS, no c/o pain this shift, IV ABX continued, had some bleeding in early am from penile drain MD aware, patient is non compliant with diet, education given, possible discharge tomorrow

## 2015-03-11 ENCOUNTER — Inpatient Hospital Stay: Payer: Medicare Other

## 2015-03-11 LAB — CBC
HCT: 29.1 % — ABNORMAL LOW (ref 40.0–52.0)
HCT: 28.3 % — ABNORMAL LOW (ref 40.0–52.0)
Hemoglobin: 9.4 g/dL — ABNORMAL LOW (ref 13.0–18.0)
Hemoglobin: 9.1 g/dL — ABNORMAL LOW (ref 13.0–18.0)
MCH: 30.1 pg (ref 26.0–34.0)
MCH: 29.7 pg (ref 26.0–34.0)
MCHC: 32.4 g/dL (ref 32.0–36.0)
MCHC: 32.2 g/dL (ref 32.0–36.0)
MCV: 92.8 fL (ref 80.0–100.0)
MCV: 92.2 fL (ref 80.0–100.0)
PLATELETS: 89 10*3/uL — AB (ref 150–440)
Platelets: 93 10*3/uL — ABNORMAL LOW (ref 150–440)
RBC: 3.13 MIL/uL — ABNORMAL LOW (ref 4.40–5.90)
RBC: 3.07 MIL/uL — ABNORMAL LOW (ref 4.40–5.90)
RDW: 18.2 % — ABNORMAL HIGH (ref 11.5–14.5)
RDW: 17.9 % — ABNORMAL HIGH (ref 11.5–14.5)
WBC: 5.7 10*3/uL (ref 3.8–10.6)
WBC: 5.6 10*3/uL (ref 3.8–10.6)

## 2015-03-11 LAB — RENAL FUNCTION PANEL
ANION GAP: 8 (ref 5–15)
Albumin: 2.2 g/dL — ABNORMAL LOW (ref 3.5–5.0)
BUN: 42 mg/dL — ABNORMAL HIGH (ref 6–20)
CO2: 28 mmol/L (ref 22–32)
Calcium: 8.4 mg/dL — ABNORMAL LOW (ref 8.9–10.3)
Chloride: 94 mmol/L — ABNORMAL LOW (ref 101–111)
Creatinine, Ser: 3.61 mg/dL — ABNORMAL HIGH (ref 0.61–1.24)
GFR calc Af Amer: 22 mL/min — ABNORMAL LOW (ref >60)
GFR calc non Af Amer: 19 mL/min — ABNORMAL LOW (ref >60)
Glucose, Bld: 277 mg/dL — ABNORMAL HIGH (ref 65–99)
Phosphorus: 3.3 mg/dL (ref 2.5–4.6)
Potassium: 5.1 mmol/L (ref 3.5–5.1)
Sodium: 130 mmol/L — ABNORMAL LOW (ref 135–145)

## 2015-03-11 LAB — GLUCOSE, CAPILLARY
GLUCOSE-CAPILLARY: 168 mg/dL — AB (ref 70–99)
Glucose-Capillary: 260 mg/dL — ABNORMAL HIGH (ref 70–99)
Glucose-Capillary: 230 mg/dL — ABNORMAL HIGH (ref 70–99)
Glucose-Capillary: 161 mg/dL — ABNORMAL HIGH (ref 70–99)

## 2015-03-11 LAB — HEPATITIS B SURFACE ANTIGEN

## 2015-03-11 MED ORDER — SODIUM CHLORIDE 0.9 % IV SOLN: 100.0000 mL | INTRAVENOUS | Status: DC | PRN

## 2015-03-11 MED ORDER — EPOETIN ALFA 10000 UNIT/ML IJ SOLN
10000.0000 [IU] | Freq: Once | INTRAMUSCULAR | Status: AC
  Administered 2015-03-11: 10000 [IU] via INTRAVENOUS

## 2015-03-11 NOTE — Plan of Care (Signed)
Problem: Discharge Progression Outcomes Goal: CBGs controlled on DM discharge meds Individualization of Care  Patient is from [facility] following penile surgery. Patient has suprapubic cath in place, and penrose drains to penis. Patient is on HD Tues, Thursday and Saturday. AV fistula is in left upper arm. Patient is a high falls, offer toileting every hour. Patient has history of ESRD, diabetes, hypothyroidism, hypertension, secondary hyperparathyroidism controlled with dialysis and home medications Goal: Other Discharge Outcomes/Goals Plan of care progress to goals: DM: down to 120 no SSI given, pt refused long acting insulin (14units) due to CBG down  Diet: continues to require PRN nausea and PRN indigestion meds, pt dry heaving and vomited X's 1 this shift.  Activity: increased weakness with increased edema noted.  Fluid restriction: pt is non compliant with fluid restriction, continues to ask for broth with saltines while c/o nausea with vomiting.  HD: Pt is scheduled to have dialysis today (normal schedule is T/Th/Sat)

## 2015-03-11 NOTE — Progress Notes (Signed)
Urology Consult Follow Up  Subjective: Made aware this evening patient readmitted last weekend for hyperglycemia.  Asked to reevalatue the patient today due to increasing penile edema.  No redness or drainage.  Patient also c/o increasing abdominal distention, n/v, and loose stool.  No fevers or chills.  Still on Augmentin/ Keflex/ fluconazole.    Anti-infectives: Anti-infectives    Start     Dose/Rate Route Frequency Ordered Stop   03/10/15 2100  amoxicillin-clavulanate (AUGMENTIN) 500-125 MG per tablet 500 mg  Status:  Discontinued    Comments:  Per Dr. Sampson Goon recommendation   1 tablet Oral Every 24 hours 03/09/15 1118 03/09/15 1529   03/10/15 1400  fluconazole (DIFLUCAN) tablet 100 mg     100 mg Oral Every 24 hours 03/09/15 1124     03/10/15 0800  cephALEXin (KEFLEX) capsule 250 mg    Comments:  Infection   250 mg Oral Every 12 hours 03/09/15 1124     03/10/15 0330  amoxicillin-clavulanate (AUGMENTIN) 500-125 MG per tablet 500 mg    Comments:  Per Dr. Sampson Goon recommendation   1 tablet Oral Every 24 hours 03/09/15 1529        Current Facility-Administered Medications  Medication Dose Route Frequency Provider Last Rate Last Dose  . 0.9 %  sodium chloride infusion  100 mL Intravenous PRN Munsoor Lateef, MD      . 0.9 %  sodium chloride infusion  100 mL Intravenous PRN Munsoor Lateef, MD      . acetaminophen (TYLENOL) tablet 650 mg  650 mg Oral Q4H PRN Doctor Chlconversion, MD   650 mg at 03/11/15 2120  . ALPRAZolam Prudy Feeler) tablet 0.25 mg  0.25 mg Oral Q6H PRN Doctor Chlconversion, MD   0.25 mg at 03/11/15 2120  . alum & mag hydroxide-simeth (MAALOX/MYLANTA) 200-200-20 MG/5ML suspension 30 mL  30 mL Oral Q6H PRN Oralia Manis, MD   30 mL at 03/11/15 2120  . amitriptyline (ELAVIL) tablet 25 mg  25 mg Oral QHS Doctor Chlconversion, MD   25 mg at 03/11/15 2119  . amoxicillin-clavulanate (AUGMENTIN) 500-125 MG per tablet 500 mg  1 tablet Oral Q24H Doctor Chlconversion, MD   500 mg at  03/11/15 0453  . aspirin EC tablet 81 mg  81 mg Oral Daily Doctor Chlconversion, MD   81 mg at 03/11/15 1401  . calcium carbonate (TUMS - dosed in mg elemental calcium) chewable tablet 400 mg of elemental calcium  2 tablet Oral QID PRN Doctor Chlconversion, MD   400 mg of elemental calcium at 03/11/15 0233  . carvedilol (COREG) tablet 25 mg  25 mg Oral BID WC Doctor Chlconversion, MD   25 mg at 03/11/15 1719  . cephALEXin (KEFLEX) capsule 250 mg  250 mg Oral Q12H Doctor Chlconversion, MD   250 mg at 03/11/15 2119  . cloNIDine (CATAPRES) tablet 0.1 mg  0.1 mg Oral BID Doctor Chlconversion, MD   0.1 mg at 03/11/15 2120  . docusate sodium (COLACE) capsule 100 mg  100 mg Oral BID PRN Doctor Chlconversion, MD      . fluconazole (DIFLUCAN) tablet 100 mg  100 mg Oral Q24H Doctor Chlconversion, MD   100 mg at 03/11/15 1400  . heparin injection 1,000 Units  1,000 Units Dialysis PRN Munsoor Lateef, MD      . hydrALAZINE (APRESOLINE) injection 10 mg  10 mg Intravenous Q4H PRN Doctor Chlconversion, MD      . insulin aspart (novoLOG) injection 0-7 Units  0-7 Units Subcutaneous TID AC & HS  Doctor Chlconversion, MD   2 Units at 03/11/15 1720  . insulin aspart (novoLOG) injection 4 Units  4 Units Subcutaneous TID WC Doctor Chlconversion, MD   4 Units at 03/11/15 1721  . insulin detemir (LEVEMIR) injection 14 Units  14 Units Subcutaneous QHS Doctor Chlconversion, MD   14 Units at 03/10/15 2301  . isosorbide mononitrate (IMDUR) 24 hr tablet 30 mg  30 mg Oral BID WC Doctor Chlconversion, MD   30 mg at 03/11/15 1718  . levothyroxine (SYNTHROID, LEVOTHROID) tablet 75 mcg  75 mcg Oral Q0600 Doctor Chlconversion, MD   75 mcg at 03/11/15 0640  . lidocaine (PF) (XYLOCAINE) 1 % injection 5 mL  5 mL Intradermal PRN Munsoor Lateef, MD      . lidocaine-prilocaine (EMLA) cream 1 application  1 application Topical PRN Munsoor Lateef, MD      . lisinopril (PRINIVIL,ZESTRIL) tablet 10 mg  10 mg Oral Daily Doctor Chlconversion, MD    10 mg at 03/11/15 1401  . ondansetron (ZOFRAN) injection 4 mg  4 mg Intravenous Q6H PRN Doctor Chlconversion, MD   4 mg at 03/11/15 2120  . PARoxetine (PAXIL) tablet 20 mg  20 mg Oral Daily Doctor Chlconversion, MD   20 mg at 03/11/15 1400  . pentafluoroprop-tetrafluoroeth (GEBAUERS) aerosol 1 application  1 application Topical PRN Munsoor Lateef, MD      . sodium chloride 0.9 % injection 3-6 mL  3-6 mL Intravenous Q10 min PRN Doctor Chlconversion, MD      . zolpidem (AMBIEN) tablet 5 mg  5 mg Oral QHS PRN Doctor Chlconversion, MD   5 mg at 03/10/15 2222     Objective: Vital signs in last 24 hours: Temp:  [97.7 F (36.5 C)-98.9 F (37.2 C)] 97.7 F (36.5 C) (05/02 2109) Pulse Rate:  [75-82] 77 (05/02 2109) Resp:  [20] 20 (05/02 1716) BP: (136-171)/(67-85) 136/73 mmHg (05/02 2109) SpO2:  [100 %] 100 % (05/02 2109) Weight:  [97.9 kg (215 lb 13.3 oz)-101.7 kg (224 lb 3.3 oz)] 97.9 kg (215 lb 13.3 oz) (05/02 1333)  Intake/Output from previous day: 05/01 0701 - 05/02 0700 In: 840 [P.O.:840] Out: 301 [Urine:300; Stool:1]  Physical Exam   NAD.  A&Ox3. NCAT.  MMM. Trachea midline. No increased work of breathing or use of accessory muscles. Abd soft, NT, ND. SPT in place draining clear yellow urine. 2+ pitting LE edema. Scrotal with mild edema, no erythema, wound packed without drainage, wound dramatically decreased in size. Penile shaft/ glans with moderate edema, penrose in place.  No purulent drainage or erythema.  Urethral meatus with ventral erosion, stable.    Lab Results:   Recent Labs  03/11/15 0501 03/11/15 0900  WBC 5.7 5.6  HGB 9.4* 9.1*  HCT 29.1* 28.3*  PLT 93* 89*   BMET  Recent Labs  03/11/15 0900  NA 130*  K 5.1  CL 94*  CO2 28  GLUCOSE 277*  BUN 42*  CREATININE 3.61*  CALCIUM 8.4*   PT/INR No results for input(s): LABPROT, INR in the last 72 hours. ABG No results for input(s): PHART, HCO3 in the last 72 hours.  Invalid input(s): PCO2,  PO2  Studies/Results: Dg Abd 1 View  03/10/2015   CLINICAL DATA:  Abdominal distention.  EXAM: ABDOMEN - 1 VIEW  COMPARISON:  CT on 02/20/2015  FINDINGS: No evidence of dilated bowel loops. Abdominal and pelvic vascular calcification noted. No definite radiopaque calculi identified. Small pleural effusions seen bilaterally, left side greater than right.  IMPRESSION: Normal  bowel gas pattern.  Small pleural effusions, left side greater than right.   Electronically Signed   By: Myles Rosenthal M.D.   On: 03/10/2015 14:15   US Abdomen Limited  03/11/2015   CLINICAL DATA:  Abdominal distension, ascites  EXAM: LIMITED ABDOMEN ULTRASOUND FOR ASCITES  TECHNIQUE: Limited ultrasound survey for ascites was performed in all four abdominal quadrants.  COMPARISON:  03/10/2015, 02/20/2015  FINDINGS: Survey of the abdominal 4 quadrants demonstrates a small to moderate amount of diffuse abdominal ascites . Volume of ascites is likely similar to the 02/20/2015 CT.  IMPRESSION: Small to moderate amount of diffuse abdominal ascites.   Electronically Signed   By: Judie Petit.  Shick M.D.   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  Readmitted with hyperglycemia few days following discharge, now H8..  Wound today appears stable/ slightly improved.  He does have penile/ scrotal edema without concern for underlying infection, suspect edema related to diffuse underlying anasarca/ ascites.  Plan: -maintain penrose -continue scrotal wound care, daily packing wet to dry -continue abx per ID -supportive care for diffuse edema -no further Urology intervention at this time -f/u with Dr. Page Spiro at [facility] upon discharge    LOS: 8 days    Taylor [name] 03/11/2015   Urology to sign off, please contact with any questions or concerns

## 2015-03-11 NOTE — Progress Notes (Signed)
SUBJECTIVE:  Review of Systems:  Subjective/Chief Complaint C/o nausea last night and said his abdomen is distended. There is no reporting of vomiting as per nursing notes. No nausea this morning.   Fever/Chills No   Cough No   Sputum No   Abdominal Pain No   Diarrhea No   Constipation No   Nausea/Vomiting No   SOB/DOE No   Chest Pain No   Dysuria No   Tolerating Diet Yes   Medications/Allergies Reviewed Medications/Allergies reviewed   VITAL SIGNS/ANCILLARY NOTES: **Vital Signs.: Filed Vitals:   03/11/15 1330  BP: 155/71  Pulse: 79  Temp: 98 F (36.7 C)  Resp: 20    Physical Exam:  GEN no acute distress   HEENT pink conjunctivae, PERRL, hearing intact to voice, moist oral mucosa   NECK supple  No masses  thyroid not tender   RESP normal resp effort  clear BS  no use of accessory muscles   CARD regular rate  murmur present  no carotid bruits  LE edema present  +1-2 edema b/l   VASCULAR ACCESS AV fistula present  Good bruit  Good thrill   ABD denies tenderness  soft  normal BS   Distended no Abdominal Bruits  no Adominal Mass, penis edematous.   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   SKIN normal to palpation, No rashes, No ulcers, Penhose drain in the penis from recent I & D of abscess.   NEURO cranial nerves intact, follows commands, motor/sensory function intact   PSYCH alert, A+O to time, place, person   ASSESSMENT/PLAN:  Assessment/Plan:  Orders/Results reviewed Orders reviewed and updated, Laboratory results reviewed, Radiology results reviewed, Vital Signs reviewed, Nursing documentation reviewed   Assessment/Plan A 44 year old Caucasian male who came into the Emergency Department with a chief complaint of weakness and shortness of breath. He was found to be in diabetic ketoacidosis. He has history of end-stage renal disease, on hemodialysis.  1.  Acute respiratory distress - due to volume overload  from Pulm edema.  - improved w/ HD. -Non compliant with fluid restriction - had HD for last 3 days daily.  2. Diabetic ketoacidosis - due to medical non-compliance.  - resolved. -Appreciate endocrine help very high blood sugars , and then hypoglycemia- appreciated help of endocrine. - re-adjusted levemir dose. now Blood sugar under control.  3.  End-stage renal disease with hyperkalemia:   - resolved w/ HD.   4. Pseudohyponatremia -  due to hyperglycemia.  - improved as sugars have corrected.   5. HTN - accelerated.  - cont. Lisinopril, Clonidine, Imdur.   6. Abnormal TSH: seen by Endocrine and no acute issue.  - cont. synthroid for now. TSH high due to non compliance- follow after 4-5 weeks.  7. Scrotal penile abscess: appreciate ID input - on keflex, augmentin, Fluconazole for at least 2 weeks.  - afebrile, wound looks good.  Follow up w/ ID as outpatient.  8. Nausea and abdominal distension   Has good Bowel sounds and able to tolerate breakfast this morning.   May be distension is due to Fluid.    KUB normal, will do US abdomen for ascites.    (continued) Ready for discharge- but he appealed against it.   DVT Prophylaxis Heparin subcutaneous   Discharge Planning SNF/LTC   Code Status Full Code   Critical Care no, Total patient care time:, 35 min   Case Discussed With patient, clinical social worker, nursing   >50% of visit spent  in couseling/coordination of care yes, discharge plan.

## 2015-03-11 NOTE — Progress Notes (Signed)
Hd treatment end 7 minutes early due patient request. Patient hygiene. Blood returned per policy; needles removed. 3000 ml fluid removal.

## 2015-03-11 NOTE — Progress Notes (Signed)
Inpatient Diabetes Program Recommendations  AACE/ADA: New Consensus Statement on Inpatient Glycemic Control (2013)  Target Ranges:  Prepandial:   less than 140 mg/dL      [facility] postprandial:   less than 180 mg/dL (1-2 hours)      Critically ill patients:  140 - 180 mg/dL   Reason for Assessment:  Results for ZEIN, HELBING (MRN [id number]) as of 03/11/2015 13:43  Ref. Range 03/10/2015 07:10 03/10/2015 11:29 03/10/2015 16:12 03/10/2015 19:56 03/10/2015 22:49 03/11/2015 07:20  Glucose-Capillary Latest Ref Range: 70-99 mg/dL 010 (H) 932 (H) 355 (H) 145 (H) 120 (H) 260 (H)    Diabetes history: Type 1 diabetes Outpatient Diabetes medications: Lantus 20 units daily, Novolog 4 units tid with meals Current orders for Inpatient glycemic control:  Lantus 14 units q HS, Novolog 4 units tid with meals, and Novolog 151-200-1 unit, 201-250-2 units, etc. tid with meals and HS.  Note that patient refused Levemir last night due to blodo glucose being 120 mg/dL.  Likely needs at least a portion of basal insulin daily even if HS blood glucose is less than 120 mg/dL due to history of Type 1 diabetes. Consider giving half of basal dose if blood glucose is less than 120 mg/dL at HS.   Thanks, Beryl Meager, RN, BC-ADM Inpatient Diabetes Coordinator Pager [phone] (8a-5p)

## 2015-03-11 NOTE — Progress Notes (Signed)
[facility]  ROUNDING NOTE   Subjective:   Seen on Hemodialysis. Tolerating treatment well. UF goal of 3 litres. Last treatment was Saturday. 4/30.   Objective:  Vital signs in last 24 hours:  Temp:  [97.6 F (36.4 C)-98.9 F (37.2 C)] 97.8 F (36.6 C) (05/02 0939) Pulse Rate:  [75-84] 76 (05/02 1130) Resp:  [20] 20 (05/02 0820) BP: (124-180)/(65-86) 148/72 mmHg (05/02 0952) SpO2:  [100 %] 100 % (05/02 0939) Weight:  [101.7 kg (224 lb 3.3 oz)] 101.7 kg (224 lb 3.3 oz) (05/02 0939)  Weight change:  Filed Weights   03/08/15 1238 03/11/15 0939  Weight: 92.897 kg (204 lb 12.8 oz) 101.7 kg (224 lb 3.3 oz)    Intake/Output: I/O last 3 completed shifts: In: 840 [P.O.:840] Out: 651 [Urine:650; Stool:1]   Intake/Output this shift:     General: Not in acute distress Head: [place]/AT, poor dentition, moist oral mucosal membranes Neck: supple, trachea midline CVS: regular rate and rhythm, no murmurs Resp: clear to auscultation bilaterally ABD: soft, nontender EXT: 1+ bilateral lower extremity edema Neuro: nonfocal. GU: scrotal edema Access: left arm AVF   Basic Metabolic Panel:  Recent Labs Lab 03/06/15 0656 03/07/15 0417 03/08/15 0425  NA 137 132* 135  K 3.8 4.8 4.1  CL 98* 96* 96*  CO2 31 28 30   GLUCOSE 102* 496* 59*  BUN 20 24* 30*  CREATININE 3.42* 3.42* 3.30*  CALCIUM 7.1* 7.0* 7.8*    Liver Function Tests: No results for input(s): AST, ALT, ALKPHOS, BILITOT, PROT, ALBUMIN in the last 168 hours. No results for input(s): LIPASE, AMYLASE in the last 168 hours. No results for input(s): AMMONIA in the last 168 hours.  CBC:  Recent Labs Lab 03/10/15 0452 03/11/15 0501  WBC  --  5.7  HGB 9.6* 9.4*  HCT  --  29.1*  MCV  --  92.8  PLT  --  93*    Cardiac Enzymes: No results for input(s): CKTOTAL, CKMB, CKMBINDEX, TROPONINI in the last 168 hours.  BNP: Invalid input(s): POCBNP  CBG:  Recent Labs Lab 03/10/15 1129 03/10/15 1612  03/10/15 1956 03/10/15 2249 03/11/15 0720  GLUCAP 254* 147* 145* 120* 260*    Microbiology: Results for orders placed or performed during the hospital encounter of 03/03/15  Urine culture     Status: None   Collection Time: 03/03/15  3:56 PM  Result Value Ref Range Status   Micro Text Report   Final       SOURCE: CLEAN CATCH    ORGANISM 1                20,000 CFU/ML Candida albicans   COMMENT                   -   ANTIBIOTIC                    ORG#1                                               Culture, blood (single)     Status: None   Collection Time: 03/03/15  3:56 PM  Result Value Ref Range Status   Micro Text Report   Final       COMMENT  NO GROWTH AEROBICALLY/ANAEROBICALLY IN 5 DAYS   ANTIBIOTIC                                                      Culture, blood (single)     Status: None   Collection Time: 03/03/15  6:15 PM  Result Value Ref Range Status   Micro Text Report   Final       COMMENT                   NO GROWTH AEROBICALLY/ANAEROBICALLY IN 5 DAYS   ANTIBIOTIC                                                        Coagulation Studies: No results for input(s): LABPROT, INR in the last 72 hours.  Urinalysis: No results for input(s): COLORURINE, LABSPEC, PHURINE, GLUCOSEU, HGBUR, BILIRUBINUR, KETONESUR, PROTEINUR, UROBILINOGEN, NITRITE, LEUKOCYTESUR in the last 72 hours.  Invalid input(s): APPERANCEUR    Imaging: Dg Abd 1 View  03/10/2015   CLINICAL DATA:  Abdominal distention.  EXAM: ABDOMEN - 1 VIEW  COMPARISON:  CT on 02/20/2015  FINDINGS: No evidence of dilated bowel loops. Abdominal and pelvic vascular calcification noted. No definite radiopaque calculi identified. Small pleural effusions seen bilaterally, left side greater than right.  IMPRESSION: Normal bowel gas pattern.  Small pleural effusions, left side greater than right.   Electronically Signed   By: Myles Rosenthal M.D.   On: 03/10/2015 14:15     Medications:     .  amitriptyline  25 mg Oral QHS  . amoxicillin-clavulanate  1 tablet Oral Q24H  . aspirin EC  81 mg Oral Daily  . carvedilol  25 mg Oral BID WC  . cephALEXin  250 mg Oral Q12H  . cloNIDine  0.1 mg Oral BID  . epoetin (EPOGEN/PROCRIT) injection  10,000 Units Intravenous Once  . fluconazole  100 mg Oral Q24H  . insulin aspart  0-7 Units Subcutaneous TID AC & HS  . insulin aspart  4 Units Subcutaneous TID WC  . insulin detemir  14 Units Subcutaneous QHS  . isosorbide mononitrate  30 mg Oral BID WC  . levothyroxine  75 mcg Oral Q0600  . lisinopril  10 mg Oral Daily  . PARoxetine  20 mg Oral Daily   sodium chloride, sodium chloride, acetaminophen, ALPRAZolam, alum & mag hydroxide-simeth, calcium carbonate, docusate sodium, heparin, hydrALAZINE, lidocaine (PF), lidocaine-prilocaine, ondansetron, pentafluoroprop-tetrafluoroeth, sodium chloride, zolpidem  Assessment/ Plan:  44 y.o. 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  1. ESRD on HD: Seen and examined on hemodialysis. Tolerating procedure well. With significant edema. Seems to be drinking a lot and has incerased intradialytic weight gain. Got off treatment early on Saturday.  Plan for hemodialysis treatment again tomorrow to be back on schedule.    2. Anemia of CKD: continue epogen with HD.  3. SHPTH: last phos 2.5, pt not on binders at the moment.  4. Penile/scrotal abscess with urtherocutaneous fistual: s/p extensive debridement with SPT placement. f/u with Urology  5. Hypertension: well controlled currently. History of difficult to control - clonidine, carvedilol, lisinopril, hydralazine. imdur   LOS:  8 Pierrette Scheu 5/2/201611:35 AM

## 2015-03-11 NOTE — Plan of Care (Signed)
Problem: Discharge Progression Outcomes Goal: Other Discharge Outcomes/Goals Outcome: Progressing Pt had no c/o pain today, VSS, had dialsysis 3 liters removed

## 2015-03-11 NOTE — Progress Notes (Signed)
Patient has increased penile swelling Dr Esaw Grandchild notified by phone is will round to see patient

## 2015-03-11 NOTE — Progress Notes (Signed)
Dr. Betti Cruz notified that patient was given 0800 dose of Keflex at 0422 instead of Augmentin. Acknowledged with no new orders except to notify rounding MD today and clarify that patient should be on both antibiotics.

## 2015-03-11 NOTE — Plan of Care (Signed)
Problem: Discharge Progression Outcomes Goal: CBGs controlled on DM discharge meds Individualization of Care  Patient is from [facility] following penile surgery. Patient has suprapubic cath in place, and penrose drains to penis. Patient is on HD Tues, Thursday and Saturday. AV fistula is in left upper arm. Patient is a high falls, offer toileting every hour. Patient has history of ESRD, diabetes, hypothyroidism, hypertension, secondary hyperparathyroidism controlled with dialysis and home medications.

## 2015-03-11 NOTE — Discharge Summary (Signed)
PATIENT NAME:  Taylor [name], Taylor [name] MR#:  [id number] DATE OF BIRTH:  September 23, 1971  DATE OF ADMISSION:  03/03/2015 DATE OF DISCHARGE:  03/09/2015  DISCHARGE DIAGNOSES:  1.  Diabetic ketoacidosis and uncontrolled diabetes, noncompliance. 2.  End-stage renal disease on hemodialysis.  3.  Hypertension.  4.  Hypokalemia.  5.  Recent scrotal abscess, still on antibiotics for that.   CONDITION ON DISCHARGE: Stable.   MEDICATIONS ON DISCHARGE:  1.  Crestor 20 mg oral once a day.  2.  Carvedilol 25 mg 2 times a day.  3.  Isosorbide mononitrate 30 mg oral 2 times a day. 4.  Clonidine 0.1 mg oral tablet 2 times a day.  5.  Amitriptyline 25 mg oral once a day.  6.  Promethazine 25 mg oral tablet every 4 hours as needed for nausea and vomiting.  7.  Acetaminophen 500 mg oral tablet 2 tablets every 6 hours as needed for pain.  8.  Aspirin 81 mg once a day.  9.  Fish oil 1000 mg oral capsule once a day.  10.  Lisinopril 10 mg oral once a day. 11.  Calcium carbonate 500 mg oral 2 tablets 4 times a day as needed for indigestion and heartburn. 12.  Fluconazole 100 mg oral once a day for 14 days.  13.  Cephalexin 250 mg oral capsule every 12 hours for 21 days.  15.  Pantoprazole 40 mg delayed release 2 times a day.  16.  Levothyroxine 75 mcg once a day.  17.  Alprazolam 0.25 mg oral tablet every 6 hours as needed for anxiety.  18.  Oxycodone 5 mg oral tablet 1 to 3 times a day as needed for pain.  19.  Augmentin 1 tablet once a day for 2 weeks.  20.  Insulin Levemir 14 units subcutaneous once a day at bedtime.  21.  Insulin aspart 4 units subcutaneous 3 times a day before meals.  22.  Docusate sodium 100 mg oral capsule 2 times a day as stool softener.  23.  Insulin 2 units subcutaneous 3 times a day per sliding scale.  DRESSING CARE:  Advised to have drainage tube in scrotal area to keep it and care as needed.   DIET: He will also be on a renal diet, regular consistency.  FOLLOWUP:  Advised to  follow in 1 to 2 weeks in urology clinic,  and 2 to 4 weeks with endocrine clinic.    HISTORY OF PRESENTING ILLNESS: A 44 year old Caucasian male with history of end-stage renal disease on hemodialysis on Tuesday, Thursday, Saturday sent over from [facility] with the chief complaint of shortness of breath, generally not feeling well and had decreased appetite for the last few days.  On chest x-ray on ER, he was noted to have pulmonary edema. His BMP showed blood sugar of 600 and potassium was 6.5 and patient was noted to be in DKA so started on insulin drip and admitted to intensive care unit for further management.   HOSPITAL COURSE AND STAY: 1.  For DKA, he initially was admitted with insulin drip in CCU. His blood sugar came under control so sent to the floor.  .  The patient's endocrinologist Dr. Tedd Sias saw in hospital as His blood sugar level kept fluctuating because of his irregular diet. and advised to follow with endocrine distinctly.   noncompliance do dialysis and also developed fluid overload.2.  Hyponatremia.  It was because of hypoglycemia.  Remained under control.   3.  Abnormal TSH.  TSH was very high.  The patient was noncompliant with levothyroxine and advised to start levothyroxin and follow in office after 4-5 weeks. 4.  Scrotal and penile abscess.  The patient had recent admission for this and he was still on antibiotics.  He had drainage tube placed in the scrotal area.  Infectious disease saw the patient and suggested to continue Keflex, Augmentin and fluconazole for at least 2 weeks and we continued that on discharge.  He is advised to follow up.  CONSULTATIONS IN THE HOSPITAL:  Nephrology, Dr. Cherylann Ratel; endocrinology, Dr. Tedd Sias; infectious disease, Dr. Sampson Goon.  IMPORTANT LABORATORY RESULTS: WBC 7.5, hemoglobin 10.2, platelet count 116,000. Glucose 675, BUN 29, creatinine 4.36, sodium 125. Troponin 0.03. Blood culture negative. Urinalysis is positive with 1+ leukocyte esterase  and 30 WBCs.  Blood culture showed no growth on presentation.  On ABG:  pH 7.29, pCO2 30, and pO2 128.  Glucose 507, BUN 32, creatinine 4.4, sodium 127, potassium 4.6. TSH was 20.45.  TOTAL TIME SPENT ON THIS DISCHARGE: 40 minutes.    ____________________________ Hope Pigeon Elisabeth Pigeon, MD vgv:sp D: 03/09/2015 16:10:00 ET T: 03/10/2015 11:47:53 ET JOB#: [id number]  cc: Onalee Hua P. Sampson Goon, MD A. Wendall Mola, MD Hope Pigeon Elisabeth Pigeon, MD, <Dictator> Primary Care Physician      Altamese Dilling MD ELECTRONICALLY SIGNED 03/10/2015 21:51

## 2015-03-12 LAB — CBC
HCT: 30.1 % — ABNORMAL LOW (ref 40.0–52.0)
Hemoglobin: 9.4 g/dL — ABNORMAL LOW (ref 13.0–18.0)
MCH: 29.1 pg (ref 26.0–34.0)
MCHC: 31.4 g/dL — ABNORMAL LOW (ref 32.0–36.0)
MCV: 92.7 fL (ref 80.0–100.0)
PLATELETS: 90 10*3/uL — AB (ref 150–440)
RBC: 3.24 MIL/uL — ABNORMAL LOW (ref 4.40–5.90)
RDW: 18 % — AB (ref 11.5–14.5)
WBC: 5.1 10*3/uL (ref 3.8–10.6)

## 2015-03-12 LAB — MISC LABCORP TEST (SEND OUT): LABCORP TEST CODE: 6510

## 2015-03-12 LAB — GLUCOSE, CAPILLARY
Glucose-Capillary: 161 mg/dL — ABNORMAL HIGH (ref 70–99)
Glucose-Capillary: 119 mg/dL — ABNORMAL HIGH (ref 70–99)
Glucose-Capillary: 72 mg/dL (ref 70–99)

## 2015-03-12 MED ORDER — ONDANSETRON 4 MG PO TBDP: 4.0000 mg | ORAL_TABLET | Freq: Three times a day (TID) | ORAL | Status: DC | PRN

## 2015-03-12 MED ORDER — EPOETIN ALFA 10000 UNIT/ML IJ SOLN
10000.0000 [IU] | Freq: Once | INTRAMUSCULAR | Status: AC
  Administered 2015-03-12: 10000 [IU] via INTRAVENOUS

## 2015-03-12 MED ORDER — FLUCONAZOLE 100 MG PO TABS: 100.0000 mg | ORAL_TABLET | Freq: Every day | ORAL | Status: DC

## 2015-03-12 MED ORDER — AMOXICILLIN-POT CLAVULANATE 500-125 MG PO TABS: 1.0000 | ORAL_TABLET | Freq: Every day | ORAL | Status: DC

## 2015-03-12 MED ORDER — PROMETHAZINE HCL 25 MG PO TABS: 25.0000 mg | ORAL_TABLET | Freq: Four times a day (QID) | ORAL | Status: DC | PRN

## 2015-03-12 MED ORDER — INSULIN DETEMIR 100 UNIT/ML ~~LOC~~ SOLN: 14.0000 [IU] | Freq: Every day | SUBCUTANEOUS | Status: DC

## 2015-03-12 MED ORDER — HYDROCODONE-ACETAMINOPHEN 5-325 MG PO TABS: 1.0000 | ORAL_TABLET | Freq: Four times a day (QID) | ORAL | Status: DC | PRN

## 2015-03-12 MED ORDER — INSULIN ASPART 100 UNIT/ML ~~LOC~~ SOLN: 0.0000 [IU] | Freq: Three times a day (TID) | SUBCUTANEOUS | Status: DC

## 2015-03-12 MED ORDER — CEPHALEXIN 250 MG PO CAPS: 250.0000 mg | ORAL_CAPSULE | Freq: Two times a day (BID) | ORAL | Status: DC

## 2015-03-12 NOTE — Progress Notes (Signed)
Pt discharged to SNF. Report given to Selena Batten, Charity fundraiser. Pt to leave via EMS

## 2015-03-12 NOTE — Plan of Care (Signed)
Problem: Discharge Progression Outcomes Goal: Discharge plan in place and appropriate Patient is a High Fall Risk Noncompliant with diabetes and fluid restrictions Hx of ESRD, DM, HTN, hyperparathyroidism continue home medications From [facility]

## 2015-03-12 NOTE — Progress Notes (Signed)
[facility]  ROUNDING NOTE   Subjective:   Seen on Hemodialysis. Tolerating treatment well. UF goal of 3 litres. Last treatmetn yesterday. Continues to have significant fluid overload.   Objective:  Vital signs in last 24 hours:  Temp:  [97.5 F (36.4 C)-98.2 F (36.8 C)] 98 F (36.7 C) (05/03 1024) Pulse Rate:  [74-84] 75 (05/03 1130) Resp:  [18-20] 18 (05/03 0725) BP: (136-167)/(65-85) 148/76 mmHg (05/03 1024) SpO2:  [98 %-100 %] 100 % (05/03 0939) Weight:  [97.9 kg (215 lb 13.3 oz)-100.2 kg (220 lb 14.4 oz)] 100.2 kg (220 lb 14.4 oz) (05/03 1024)  Weight change:  Filed Weights   03/11/15 0939 03/11/15 1333 03/12/15 1024  Weight: 101.7 kg (224 lb 3.3 oz) 97.9 kg (215 lb 13.3 oz) 100.2 kg (220 lb 14.4 oz)    Intake/Output: I/O last 3 completed shifts: In: 1240 [P.O.:240; Other:1000] Out: 7304 [Urine:1300; Other:6000; Stool:4]   Intake/Output this shift:  Total I/O In: -  Out: 200 [Emesis/NG output:200]  General: Not in acute distress Head: [place]/AT, poor dentition, moist oral mucosal membranes Neck: supple, trachea midline CVS: regular rate and rhythm, no murmurs Resp: clear to auscultation bilaterally ABD: soft, nontender EXT: 1+ bilateral lower extremity edema Neuro: nonfocal. GU: scrotal edema Access: left arm AVF   Basic Metabolic Panel:  Recent Labs Lab 03/06/15 0656 03/07/15 0417 03/08/15 0425 03/11/15 0900  NA 137 132* 135 130*  K 3.8 4.8 4.1 5.1  CL 98* 96* 96* 94*  CO2 31 28 30 28   GLUCOSE 102* 496* 59* 277*  BUN 20 24* 30* 42*  CREATININE 3.42* 3.42* 3.30* 3.61*  CALCIUM 7.1* 7.0* 7.8* 8.4*  PHOS  --   --   --  3.3    Liver Function Tests:  Recent Labs Lab 03/11/15 0900  ALBUMIN 2.2*   No results for input(s): LIPASE, AMYLASE in the last 168 hours. No results for input(s): AMMONIA in the last 168 hours.  CBC:  Recent Labs Lab 03/10/15 0452 03/11/15 0501 03/11/15 0900 03/11/15 2311  WBC  --  5.7 5.6 5.1  HGB  9.6* 9.4* 9.1* 9.4*  HCT  --  29.1* 28.3* 30.1*  MCV  --  92.8 92.2 92.7  PLT  --  93* 89* 90*    Cardiac Enzymes: No results for input(s): CKTOTAL, CKMB, CKMBINDEX, TROPONINI in the last 168 hours.  BNP: Invalid input(s): POCBNP  CBG:  Recent Labs Lab 03/11/15 1358 03/11/15 1609 03/11/15 2144 03/12/15 0744 03/12/15 0957  GLUCAP 168* 230* 161* 161* 119*    Microbiology: Results for orders placed or performed during the hospital encounter of 03/03/15  Urine culture     Status: None   Collection Time: 03/03/15  3:56 PM  Result Value Ref Range Status   Micro Text Report   Final       SOURCE: CLEAN CATCH    ORGANISM 1                20,000 CFU/ML Candida albicans   COMMENT                   -   ANTIBIOTIC                    ORG#1  Culture, blood (single)     Status: None   Collection Time: 03/03/15  3:56 PM  Result Value Ref Range Status   Micro Text Report   Final       COMMENT                   NO GROWTH AEROBICALLY/ANAEROBICALLY IN 5 DAYS   ANTIBIOTIC                                                      Culture, blood (single)     Status: None   Collection Time: 03/03/15  6:15 PM  Result Value Ref Range Status   Micro Text Report   Final       COMMENT                   NO GROWTH AEROBICALLY/ANAEROBICALLY IN 5 DAYS   ANTIBIOTIC                                                        Coagulation Studies: No results for input(s): LABPROT, INR in the last 72 hours.  Urinalysis: No results for input(s): COLORURINE, LABSPEC, PHURINE, GLUCOSEU, HGBUR, BILIRUBINUR, KETONESUR, PROTEINUR, UROBILINOGEN, NITRITE, LEUKOCYTESUR in the last 72 hours.  Invalid input(s): APPERANCEUR    Imaging: Dg Abd 1 View  03/10/2015   CLINICAL DATA:  Abdominal distention.  EXAM: ABDOMEN - 1 VIEW  COMPARISON:  CT on 02/20/2015  FINDINGS: No evidence of dilated bowel loops. Abdominal and pelvic vascular calcification noted. No definite  radiopaque calculi identified. Small pleural effusions seen bilaterally, left side greater than right.  IMPRESSION: Normal bowel gas pattern.  Small pleural effusions, left side greater than right.   Electronically Signed   By: Myles Rosenthal M.D.   On: 03/10/2015 14:15   US Abdomen Limited  03/11/2015   CLINICAL DATA:  Abdominal distension, ascites  EXAM: LIMITED ABDOMEN ULTRASOUND FOR ASCITES  TECHNIQUE: Limited ultrasound survey for ascites was performed in all four abdominal quadrants.  COMPARISON:  03/10/2015, 02/20/2015  FINDINGS: Survey of the abdominal 4 quadrants demonstrates a small to moderate amount of diffuse abdominal ascites . Volume of ascites is likely similar to the 02/20/2015 CT.  IMPRESSION: Small to moderate amount of diffuse abdominal ascites.   Electronically Signed   By: Judie Petit.  Shick M.D.   On: 03/11/2015 17:00     Medications:     . amitriptyline  25 mg Oral QHS  . amoxicillin-clavulanate  1 tablet Oral Q24H  . aspirin EC  81 mg Oral Daily  . carvedilol  25 mg Oral BID WC  . cephALEXin  250 mg Oral Q12H  . cloNIDine  0.1 mg Oral BID  . fluconazole  100 mg Oral Q24H  . insulin aspart  0-7 Units Subcutaneous TID AC & HS  . insulin aspart  4 Units Subcutaneous TID WC  . insulin detemir  14 Units Subcutaneous QHS  . isosorbide mononitrate  30 mg Oral BID WC  . levothyroxine  75 mcg Oral Q0600  . lisinopril  10 mg Oral Daily  . PARoxetine  20 mg Oral Daily   sodium chloride, sodium chloride,  sodium chloride, sodium chloride, acetaminophen, ALPRAZolam, alum & mag hydroxide-simeth, calcium carbonate, docusate sodium, heparin, hydrALAZINE, lidocaine (PF), lidocaine-prilocaine, ondansetron, pentafluoroprop-tetrafluoroeth, sodium chloride, zolpidem  Assessment/ Plan:  44 y.o. 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 TTS  1. ESRD on HD: Seen and examined on hemodialysis. Tolerating procedure well. With significant edema. Seems to be drinking a lot and has incerased intradialytic weight gain. Getting off dialysis early. Got off treatment early on Saturday.  Plan for hemodialysis treatment on TTS schedule   2. Anemia of CKD: continue epogen with HD. hgb 9.4   3. SHPTH: last phos 2.5, pt not on binders at the moment.  4. Penile/scrotal abscess with urtherocutaneous fistual: s/p extensive debridement with SPT placement. f/u with Urology  5. Hypertension: well controlled currently. History of difficult to control - clonidine, carvedilol, lisinopril, hydralazine. imdur   LOS: 9 Deardra Hinkley 5/3/201612:04 PM

## 2015-03-12 NOTE — Discharge Instructions (Signed)
Diet- Renal diet Fluid restriction- 1200 ml in a day Activities- as tolerated Being discharged with penile and scrotal catheter- need to follow with Urology clinic in 1 week with Dr. Apolinar Junes Follow with Endocrinologist in ofice in 3-4 weeks- need to check Blood sugar control and TSH with him.

## 2015-03-12 NOTE — Plan of Care (Signed)
Problem: Discharge Progression Outcomes Goal: Discharge plan in place and appropriate Patient is a High Fall Risk Patient has a R AV Fistula, Dialysis is Tuesday, Thursday and Saturday. Patient has a penrose drain and a suprapubic catheter.  Hx of ESRD, DM, HTN, hyperparathyroidism    Goal: Other Discharge Outcomes/Goals Outcome: Progressing Plan of Care Progress to Goals:  DKA: Patients glucose has been controlled with Novolog and Levemir. No signs or symptoms of distress. Wound: Patient's penile surgical site has minimal drainage, penile swelling present.

## 2015-03-12 NOTE — Care Management (Signed)
Notified by Mosie Lukes yesterday that his discharge appeal was over turned. Will need to be discharged by noon today. [name] of abdomen was completed yesterday. Revealed diffused moderate amount of ascitis. Possible discharge back to [facility] today. Gwenette Greet RN MSN Care Management [phone]

## 2015-03-12 NOTE — Progress Notes (Signed)
Hd terminated 2 hours early. Patient requested to sign off AMA. Dr Wynelle Link at bedside. 1180 ml fluid removed. Blood returned per policy; needles removed. Hemostasis achieved after 10 minutes.

## 2015-03-12 NOTE — Progress Notes (Signed)
Patient for d/c today to SNF bed at   [facility]. Patient agreeable to this plan- plan transfer via EMS. Reece Levy, MSW, Theresia Majors

## 2015-03-12 NOTE — Progress Notes (Signed)
SUBJECTIVE:  Review of Systems:  Subjective/Chief Complaint C/o nausea last night and said his abdomen is distended. Had some nausea this morning.   Fever/Chills No   Cough No   Sputum No   Abdominal Pain No   Diarrhea No   Constipation No   Nausea/Vomiting No   SOB/DOE No   Chest Pain No   Dysuria No   Tolerating Diet Yes   Medications/Allergies Reviewed Medications/Allergies reviewed   VITAL SIGNS/ANCILLARY NOTES: **Vital Signs.: Filed Vitals:   03/12/15 0939  BP: 158/80  Pulse: 74  Temp: 98.1 F (36.7 C)  Resp:     Physical Exam:  GEN no acute distress   HEENT pink conjunctivae, PERRL, hearing intact to voice, moist oral mucosa   NECK supple  No masses  thyroid not tender   RESP normal resp effort  clear BS  no use of accessory muscles   CARD regular rate  murmur present  no carotid bruits  LE edema present  +1-2 edema b/l   VASCULAR ACCESS AV fistula present  Good bruit  Good thrill   ABD denies tenderness  soft  normal BS   Distended no Abdominal Bruits  no Adominal Mass, penis edematous.   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   SKIN normal to palpation, No rashes, No ulcers, Penhose drain in the penis from recent I & D of abscess.   NEURO cranial nerves intact, follows commands, motor/sensory function intact   PSYCH alert, A+O to time, place, person   ASSESSMENT/PLAN:  Assessment/Plan:  Orders/Results reviewed Orders reviewed and updated, Laboratory results reviewed, Radiology results reviewed, Vital Signs reviewed, Nursing documentation reviewed   Assessment/Plan A 44 year old Caucasian male who came into the Emergency Department with a chief complaint of weakness and shortness of breath. He was found to be in diabetic ketoacidosis. He has history of end-stage renal disease, on hemodialysis.  1.  Acute respiratory distress - due to volume overload from Pulm edema.  - improved w/ HD. -Non  compliant with fluid restriction  2. Diabetic ketoacidosis - due to medical non-compliance.  - resolved. -Appreciate endocrine help very high blood sugars , and then hypoglycemia- appreciated help of endocrine. - re-adjusted levemir dose. now Blood sugar under control.  3.  End-stage renal disease with hyperkalemia:   - resolved w/ HD.   4. Pseudohyponatremia -  due to hyperglycemia.  - improved as sugars have corrected.   5. HTN - accelerated.  - cont. Lisinopril, Clonidine, Imdur.   6. Abnormal TSH: seen by Endocrine and no acute issue.  - cont. synthroid for now. TSH high due to non compliance- follow after 4-5 weeks.  7. Scrotal penile abscess: appreciate ID input - on keflex, augmentin, Fluconazole for at least 1 weeks.  - afebrile, wound looks good.  Follow up w/ ID as outpatient.  seen by urologist- advised to follow in 1 week in office.  8. Nausea and abdominal distension   Has good Bowel sounds   May be distension is due to Fluid.    KUB normal, US abdomen- confirms mild to moderate ascites- same as previous CT one month ago.    (continued) Ready for discharge- but he appealed against it.   Will discharge today.   DVT Prophylaxis Heparin subcutaneous   Discharge Planning SNF/LTC   Code Status Full Code   Critical Care no, Total patient care time:, 35 min   Case Discussed With patient, clinical social worker, nursing   >50% of visit  spent in couseling/coordination of care yes, discharge plan.

## 2015-03-12 NOTE — Discharge Summary (Addendum)
PATIENT NAME: Taylor Taylor [name], Taylor Taylor [name]  DATE OF BIRTH: 04-24-1971  DATE OF ADMISSION: 03/03/2015 DATE OF DISCHARGE: 03/12/2015  DISCHARGE DIAGNOSES:  1. Diabetic ketoacidosis and uncontrolled diabetes, noncompliance. 2. End-stage renal disease on hemodialysis.  3. Hypertension.  4. Hypokalemia.  5. Recent scrotal abscess, still on antibiotics for that.  6. Ascites- stable  CONDITION ON DISCHARGE: Stable.   MEDICATIONS ON DISCHARGE:      Medication List    STOP taking these medications        insulin glargine 100 UNIT/ML injection  Commonly known as:  LANTUS     nystatin-triamcinolone cream  Commonly known as:  MYCOLOG II     oxyCODONE 5 MG immediate release tablet  Commonly known as:  Oxy IR/ROXICODONE      TAKE these medications        acetaminophen 500 MG tablet  Commonly known as:  TYLENOL  Take 1,000 mg by mouth every 6 (six) hours as needed (pain).     ALPRAZolam 0.25 MG tablet  Commonly known as:  XANAX  Take 0.25 mg by mouth every 6 (six) hours as needed for anxiety.     alum & mag hydroxide-simeth 400-400-40 MG/5ML suspension  Commonly known as:  MAALOX PLUS  Take 15 mLs by mouth every 4 (four) hours as needed for indigestion.     amitriptyline 25 MG tablet  Commonly known as:  ELAVIL  Take 1 tablet (25 mg total) by mouth at bedtime.     amoxicillin-clavulanate 500-125 MG per tablet  Commonly known as:  AUGMENTIN  Take 1 tablet (500 mg total) by mouth at bedtime.     aspirin 81 MG EC tablet  Take 81 mg by mouth daily. Swallow whole.     calcium carbonate 500 MG chewable tablet  Commonly known as:  TUMS - dosed in mg elemental calcium  Chew 1-2 tablets (200-400 mg of elemental calcium total) by mouth 2 (two) times daily as needed for indigestion or heartburn.     carvedilol 25 MG tablet  Commonly known as:  COREG  Take 1 tablet (25 mg total) by mouth 2 (two) times daily with Taylor [name] meal.     cephALEXin 250 MG capsule  Commonly  known as:  KEFLEX  Take 1 capsule (250 mg total) by mouth every 12 (twelve) hours.     cinacalcet 60 MG tablet  Commonly known as:  SENSIPAR  Take 60 mg by mouth at bedtime. With biggest meal     cloNIDine 0.1 MG tablet  Commonly known as:  CATAPRES  Take 0.1 mg by mouth 2 (two) times daily.     feeding supplement (NEPRO CARB STEADY) Liqd  Take 237 mLs by mouth as needed (missed meal during dialysis.).     fluconazole 100 MG tablet  Commonly known as:  DIFLUCAN  Take 1 tablet (100 mg total) by mouth daily.     furosemide 80 MG tablet  Commonly known as:  LASIX  Take 80 mg by mouth 2 (two) times daily.     HYDROcodone-acetaminophen 5-325 MG per tablet  Commonly known as:  NORCO/VICODIN  Take 1 tablet by mouth every 6 (six) hours as needed for moderate pain.     insulin aspart 100 UNIT/ML injection  Commonly known as:  novoLOG  Inject 4 Units into the skin 3 (three) times daily with meals.     insulin aspart 100 UNIT/ML injection  Commonly known as:  novoLOG  Inject 0-7 Units into the skin 4 (four) times daily -  before meals and at bedtime.   Per sliding scale- up to 150- 0 units, 151-200- 2 units, 201-250- 3 units, 251-300- 4 units, 301-350- 6 units, 351-400- 7 units, call MD if >400.     insulin detemir 100 UNIT/ML injection  Commonly known as:  LEVEMIR  Inject 0.14 mLs (14 Units total) into the skin at bedtime.     isosorbide mononitrate 30 MG 24 hr tablet  Commonly known as:  IMDUR  Take 30 mg by mouth 2 (two) times daily.     levothyroxine 75 MCG tablet  Commonly known as:  SYNTHROID, LEVOTHROID  Take 1 tablet (75 mcg total) by mouth daily before breakfast.     lisinopril 10 MG tablet  Commonly known as:  PRINIVIL,ZESTRIL  Take 10 mg by mouth daily.     OMEGA 3 PO  Take 1,000 mg by mouth daily.     ondansetron 4 MG disintegrating tablet  Commonly known as:  ZOFRAN ODT  Take 1 tablet (4 mg total) by mouth every 8 (eight) hours as needed for nausea or  vomiting.     pantoprazole 40 MG tablet  Commonly known as:  PROTONIX  Take 40 mg by mouth 2 (two) times daily.     PARoxetine 20 MG tablet  Commonly known as:  PAXIL  Take 20 mg by mouth every morning.     promethazine 25 MG tablet  Commonly known as:  PHENERGAN  Take 1 tablet (25 mg total) by mouth every 6 (six) hours as needed for nausea or vomiting.     rosuvastatin 20 MG tablet  Commonly known as:  CRESTOR  Take 1 tablet (20 mg total) by mouth at bedtime.     sevelamer carbonate 800 MG tablet  Commonly known as:  RENVELA  Take 800 mg by mouth 3 (three) times daily with meals.       DRESSING CARE: Advised to have drainage tube in scrotal area to keep it and care as needed.   DIET: He will also be on Taylor [name] renal diet, regular consistency.  FOLLOWUP: Advised to follow in 1 to 2 weeks in urology clinic, and 2 to 4 weeks with endocrine clinic.   HISTORY OF PRESENTING ILLNESS: Taylor [name] 44 year old Caucasian male with history of end-stage renal disease on hemodialysis on Tuesday, Thursday, Saturday sent over from [facility] with the chief complaint of shortness of breath, generally not feeling well and had decreased appetite for the last few days. On chest x-ray on ER, he was noted to have pulmonary edema. His BMP showed blood sugar of 600 and potassium was 6.5 and patient was noted to be in DKA so started on insulin drip and admitted to intensive care unit for further management.   HOSPITAL COURSE AND STAY: 1. For DKA, he initially was admitted with insulin drip in CCU. His blood sugar came under control so sent to the floor. . The patient's endocrinologist Dr. Tedd Sias saw in hospital as His blood sugar level kept fluctuating because of his irregular diet. and advised to follow with endocrine distinctly. noncompliance do dialysis and also developed fluid overload.2. Hyponatremia. It was because of hypoglycemia. Remained under control.  3. Abnormal TSH. TSH was very  high. The patient was noncompliant with levothyroxine and advised to start levothyroxin and follow in office after 4-5 weeks. 4. Scrotal and penile abscess. The patient had recent admission for this and he was still on antibiotics. He had drainage tube placed in the scrotal area. Infectious disease saw the patient  and suggested to continue Keflex, Augmentin and fluconazole for at least 2 weeks and we continued that on discharge. He is advised to follow up. 5. Abdominal pain and distension- Xray abd and [name] abd done- showed moderate ascites-  CONSULTATIONS IN THE HOSPITAL: Nephrology, Dr. Cherylann Ratel; endocrinology, Dr. Tedd Sias; infectious disease, Dr. Sampson Goon. Urology- Dr. Vanna Scotland  IMPORTANT LABORATORY RESULTS: WBC 7.5, hemoglobin 10.2, platelet count 116,000. Glucose 675, BUN 29, creatinine 4.36, sodium 125. Troponin 0.03. Blood culture negative. Urinalysis is positive with 1+ leukocyte esterase and 30 WBCs. Blood culture showed no growth on presentation. On ABG: pH 7.29, pCO2 30, and pO2 128. Glucose 507, BUN 32, creatinine 4.4, sodium 127, potassium 4.6. TSH was 20.45.  TOTAL TIME SPENT ON THIS DISCHARGE: 40 minutes.     ____________________________ Taylor Taylor [name] Elisabeth Pigeon, MD NG:EXBMW P. Sampson Goon, MD Taylor [name]. Wendall Mola, MD Taylor Taylor [name] Elisabeth Pigeon, MD,

## 2015-03-12 NOTE — Progress Notes (Signed)
Patient's Father, Godwin Tedesco was notified at 0740 about patient's fall in AM. His father understood and acknowledge the situation.  Charmaine Downs

## 2015-03-12 NOTE — Progress Notes (Signed)
Inpatient Diabetes Program Recommendations  AACE/ADA: New Consensus Statement on Inpatient Glycemic Control (2013)  Target Ranges:  Prepandial:   less than 140 mg/dL      Peak postprandial:   less than 180 mg/dL (1-2 hours)      Critically ill patients:  140 - 180 mg/dL   Results for LAKEN, LOBATO (MRN [id number]) as of 03/12/2015 09:58  Ref. Range 03/11/2015 13:58 03/11/2015 16:09 03/11/2015 21:44 03/12/2015 07:44  Glucose-Capillary Latest Ref Range: 70-99 mg/dL 726 (H) 203 (H) 559 (H) 161 (H)    Diabetes history: Type 1 diabetes Outpatient Diabetes medications: Lantus 20 units daily, Novolog 4 units tid with meals Current orders for Inpatient glycemic control:  Levemir 14 units q HS, Novolog 4 units tid with meals, and Novolog 151-200-1 unit, 201-250-2 units, etc. tid with meals and HS.  Stable blood sugar noted- no hypoglycemia.  Blood sugar slightly above ADA guidelines- may require a slight increase in Levemir insulin to 15 units  (fasting CBG 161mg /dl) q hs.  Continue mealtime and correction Novolog insulin as ordered. Will continue to follow.   , RN, BA, MHA, CDE Diabetes Coordinator Inpatient Diabetes Program  [phone] (Team Pager) [phone] [facility] Office) 03/12/2015 10:05 AM

## 2015-03-13 ENCOUNTER — Encounter: Payer: Self-pay | Admitting: *Deleted

## 2015-03-13 ENCOUNTER — Emergency Department: Payer: Medicare Other

## 2015-03-13 ENCOUNTER — Inpatient Hospital Stay
Admission: EM | Admit: 2015-03-13 | Discharge: 2015-03-19 | DRG: 391 | Disposition: A | Discharge status: Skilled Nursing Facility | Payer: Medicare Other | Attending: Internal Medicine | Admitting: Internal Medicine

## 2015-03-13 DIAGNOSIS — I509 Heart failure, unspecified: Secondary | ICD-10-CM | POA: Diagnosis present

## 2015-03-13 DIAGNOSIS — Z452 Encounter for adjustment and management of vascular access device: Secondary | ICD-10-CM

## 2015-03-13 DIAGNOSIS — Z8701 Personal history of pneumonia (recurrent): Secondary | ICD-10-CM

## 2015-03-13 DIAGNOSIS — D631 Anemia in chronic kidney disease: Secondary | ICD-10-CM | POA: Diagnosis present

## 2015-03-13 DIAGNOSIS — E785 Hyperlipidemia, unspecified: Secondary | ICD-10-CM | POA: Diagnosis present

## 2015-03-13 DIAGNOSIS — I252 Old myocardial infarction: Secondary | ICD-10-CM

## 2015-03-13 DIAGNOSIS — M069 Rheumatoid arthritis, unspecified: Secondary | ICD-10-CM | POA: Diagnosis present

## 2015-03-13 DIAGNOSIS — R112 Nausea with vomiting, unspecified: Secondary | ICD-10-CM

## 2015-03-13 DIAGNOSIS — K210 Gastro-esophageal reflux disease with esophagitis: Secondary | ICD-10-CM | POA: Diagnosis present

## 2015-03-13 DIAGNOSIS — Z888 Allergy status to other drugs, medicaments and biological substances status: Secondary | ICD-10-CM

## 2015-03-13 DIAGNOSIS — N186 End stage renal disease: Secondary | ICD-10-CM | POA: Diagnosis present

## 2015-03-13 DIAGNOSIS — N508 Other specified disorders of male genital organs: Secondary | ICD-10-CM | POA: Diagnosis present

## 2015-03-13 DIAGNOSIS — Z79899 Other long term (current) drug therapy: Secondary | ICD-10-CM | POA: Diagnosis not present

## 2015-03-13 DIAGNOSIS — Z880 Allergy status to penicillin: Secondary | ICD-10-CM

## 2015-03-13 DIAGNOSIS — K449 Diaphragmatic hernia without obstruction or gangrene: Secondary | ICD-10-CM | POA: Diagnosis present

## 2015-03-13 DIAGNOSIS — I2510 Atherosclerotic heart disease of native coronary artery without angina pectoris: Secondary | ICD-10-CM | POA: Diagnosis present

## 2015-03-13 DIAGNOSIS — I739 Peripheral vascular disease, unspecified: Secondary | ICD-10-CM | POA: Diagnosis present

## 2015-03-13 DIAGNOSIS — Z794 Long term (current) use of insulin: Secondary | ICD-10-CM

## 2015-03-13 DIAGNOSIS — N492 Inflammatory disorders of scrotum: Secondary | ICD-10-CM | POA: Diagnosis present

## 2015-03-13 DIAGNOSIS — E039 Hypothyroidism, unspecified: Secondary | ICD-10-CM | POA: Diagnosis present

## 2015-03-13 DIAGNOSIS — E1040 Type 1 diabetes mellitus with diabetic neuropathy, unspecified: Secondary | ICD-10-CM | POA: Diagnosis present

## 2015-03-13 DIAGNOSIS — Z992 Dependence on renal dialysis: Secondary | ICD-10-CM | POA: Diagnosis not present

## 2015-03-13 DIAGNOSIS — E10621 Type 1 diabetes mellitus with foot ulcer: Secondary | ICD-10-CM | POA: Diagnosis present

## 2015-03-13 DIAGNOSIS — Z87891 Personal history of nicotine dependence: Secondary | ICD-10-CM

## 2015-03-13 DIAGNOSIS — K920 Hematemesis: Secondary | ICD-10-CM | POA: Diagnosis present

## 2015-03-13 DIAGNOSIS — Z8249 Family history of ischemic heart disease and other diseases of the circulatory system: Secondary | ICD-10-CM

## 2015-03-13 DIAGNOSIS — Z951 Presence of aortocoronary bypass graft: Secondary | ICD-10-CM | POA: Diagnosis not present

## 2015-03-13 DIAGNOSIS — R188 Other ascites: Secondary | ICD-10-CM

## 2015-03-13 DIAGNOSIS — N2581 Secondary hyperparathyroidism of renal origin: Secondary | ICD-10-CM | POA: Diagnosis present

## 2015-03-13 DIAGNOSIS — I120 Hypertensive chronic kidney disease with stage 5 chronic kidney disease or end stage renal disease: Secondary | ICD-10-CM | POA: Diagnosis present

## 2015-03-13 DIAGNOSIS — Z89429 Acquired absence of other toe(s), unspecified side: Secondary | ICD-10-CM

## 2015-03-13 DIAGNOSIS — Z833 Family history of diabetes mellitus: Secondary | ICD-10-CM

## 2015-03-13 LAB — BASIC METABOLIC PANEL
Anion gap: 9 (ref 5–15)
BUN: 29 mg/dL — ABNORMAL HIGH (ref 6–20)
CO2: 31 mmol/L (ref 22–32)
Calcium: 8.8 mg/dL — ABNORMAL LOW (ref 8.9–10.3)
Chloride: 96 mmol/L — ABNORMAL LOW (ref 101–111)
Creatinine, Ser: 3.31 mg/dL — ABNORMAL HIGH (ref 0.61–1.24)
GFR, EST AFRICAN AMERICAN: 24 mL/min — AB (ref >60)
GFR, EST NON AFRICAN AMERICAN: 21 mL/min — AB (ref >60)
Glucose, Bld: 197 mg/dL — ABNORMAL HIGH (ref 65–99)
Potassium: 4.7 mmol/L (ref 3.5–5.1)
SODIUM: 136 mmol/L (ref 135–145)

## 2015-03-13 LAB — CBC
HEMATOCRIT: 32.3 % — AB (ref 40.0–52.0)
HEMOGLOBIN: 10.2 g/dL — AB (ref 13.0–18.0)
MCH: 29.2 pg (ref 26.0–34.0)
MCHC: 31.6 g/dL — AB (ref 32.0–36.0)
MCV: 92.1 fL (ref 80.0–100.0)
Platelets: 105 10*3/uL — ABNORMAL LOW (ref 150–440)
RBC: 3.51 MIL/uL — ABNORMAL LOW (ref 4.40–5.90)
RDW: 17.8 % — AB (ref 11.5–14.5)
WBC: 5.3 10*3/uL (ref 3.8–10.6)

## 2015-03-13 LAB — TYPE AND SCREEN
ABO/RH(D): O POS
ANTIBODY SCREEN: NEGATIVE

## 2015-03-13 LAB — GLUCOSE, CAPILLARY: Glucose-Capillary: 173 mg/dL — ABNORMAL HIGH (ref 70–99)

## 2015-03-13 LAB — TROPONIN I: TROPONIN I: 0.03 ng/mL (ref <0.031)

## 2015-03-13 MED ORDER — FLUCONAZOLE 100MG IVPB
100.0000 mg | INTRAVENOUS | Status: DC
  Administered 2015-03-14 – 2015-03-15 (×2): 100 mg via INTRAVENOUS
  Filled 2015-03-13 (×3): qty 50

## 2015-03-13 MED ORDER — INSULIN ASPART 100 UNIT/ML ~~LOC~~ SOLN
0.0000 [IU] | Freq: Three times a day (TID) | SUBCUTANEOUS | Status: DC
  Administered 2015-03-14: 09:00:00 3 [IU] via SUBCUTANEOUS
  Filled 2015-03-13: qty 3

## 2015-03-13 MED ORDER — LORAZEPAM 2 MG/ML IJ SOLN
1.0000 mg | Freq: Three times a day (TID) | INTRAMUSCULAR | Status: DC | PRN
  Administered 2015-03-14 – 2015-03-19 (×9): 1 mg via INTRAVENOUS
  Filled 2015-03-13 (×10): qty 1

## 2015-03-13 MED ORDER — GI COCKTAIL ~~LOC~~
30.0000 mL | Freq: Once | ORAL | Status: AC
  Administered 2015-03-13: 30 mL via ORAL

## 2015-03-13 MED ORDER — SODIUM CHLORIDE 0.9 % IV SOLN
80.0000 mg | Freq: Once | INTRAVENOUS | Status: AC
  Administered 2015-03-13: 80 mg via INTRAVENOUS
  Filled 2015-03-13: qty 80

## 2015-03-13 MED ORDER — ONDANSETRON HCL 4 MG/2ML IJ SOLN
4.0000 mg | Freq: Once | INTRAMUSCULAR | Status: AC
  Administered 2015-03-13: 4 mg via INTRAVENOUS

## 2015-03-13 MED ORDER — SODIUM CHLORIDE 0.9 % IV SOLN
8.0000 mg/h | INTRAVENOUS | Status: DC
  Administered 2015-03-13 – 2015-03-17 (×6): 8 mg/h via INTRAVENOUS
  Filled 2015-03-13 (×8): qty 80

## 2015-03-13 MED ORDER — ONDANSETRON HCL 4 MG/2ML IJ SOLN
INTRAMUSCULAR | Status: AC
  Filled 2015-03-13: qty 2

## 2015-03-13 MED ORDER — HYDRALAZINE HCL 20 MG/ML IJ SOLN
5.0000 mg | Freq: Four times a day (QID) | INTRAMUSCULAR | Status: DC | PRN
  Administered 2015-03-14 – 2015-03-15 (×2): 5 mg via INTRAVENOUS
  Filled 2015-03-13 (×2): qty 1

## 2015-03-13 MED ORDER — CEFTRIAXONE SODIUM IN DEXTROSE 20 MG/ML IV SOLN
1.0000 g | INTRAVENOUS | Status: DC
  Administered 2015-03-14 – 2015-03-15 (×2): 1 g via INTRAVENOUS
  Filled 2015-03-13 (×4): qty 50

## 2015-03-13 MED ORDER — GI COCKTAIL ~~LOC~~
ORAL | Status: AC
  Filled 2015-03-13: qty 30

## 2015-03-13 NOTE — ED Notes (Signed)
Pt vomited in xray, Dr. Steve Rattler notified and shown the vomit Taylor [name], Maryann Alar, RN

## 2015-03-13 NOTE — ED Notes (Signed)
Pt changed and cleaned Taylor [name], Taylor Alar, RN

## 2015-03-13 NOTE — H&P (Signed)
[facility] [place] at [facility]   PATIENT NAME: Taylor [name]    MR#:  [id number]  DATE OF BIRTH:  08-25-1971  DATE OF ADMISSION:  03/13/2015  PRIMARY CARE PHYSICIAN: [name], MD   REQUESTING/REFERRING PHYSICIAN: Dr.Schaevitz  CHIEF COMPLAINT:  Vomiting blood and chest pain  HISTORY OF PRESENT ILLNESS:  Taylor [name]  is a 44 y.o. male with a known history of end-stage renal disease, on hemodialysis and was just recently admitted to the hospital with DKA and was discharged home yesterday. Patient has been nauseous and vomiting today with no abdominal pain. He has reported 6 episodes of vomiting so far. He noticed both frank blood as well as coffee-ground emesis. In the ED patient had one episode of emesis with bright red blood as reported by the RN and in the radiology department he had one episode of coffee-ground emesis. Patient denies any bleeding per rectum.Complains in the past. Denies any dizziness or loss of consciousness. His last dialysis was yesterday. He reports that his chest hurts while vomiting. No other complaints. Patient's vital signs are stable in the ED. Hemoglobin is at 10.2  PAST MEDICAL HISTORY:   Past Medical History  Diagnosis Date  . End stage renal disease on dialysis     LUE fistula  . Type I diabetes mellitus     a. 03/2014 admitted with HNK to [facility].  . Diabetic neuropathy     severe, s/p multiple toe amputation  . Hypothyroidism   . Hypertension   . Hyperlipidemia   . H/O hiatal hernia   . GERD (gastroesophageal reflux disease)   . Anxiety   . Sebaceous cyst     side of neck  . Pneumonia     2010  . Anemia     a. req PRBC's 2011.  [name] PAD (peripheral artery disease)     a. s/p amputation of toes on the right;  b. left LE claudication.  . Coronary artery disease     a. s/p MI;  b. 10/2009 CABG x 3 @ [facility]: LIMA->LAD, VG->OM3, VG->RPDA; c. 11/2010 Cath 3/3 patent grafts;  d 12/2012 Cath: LM 30d, LAD 85p, D1  70, D2 90, LCX 40ost, OM2 100, RCA 90p, 17m, L->LAD ok, VG->OM3 ok, VG->RPDA 30, EF 50%->Med Rx.  . Cataract     right  . Valvular disease     a. 11/2012 Echo: EF 55-60%, mild LVH, mild MR, mild bi-atrial enlargement, mild-mod TR, PASP .  . CHF (congestive heart failure)   . Depression   . Arthritis     rheumatoid arthritis   . Hx of transfusion of packed red blood cells   . Myocardial infarction 2010  . Anginal pain     PAST SURGICAL HISTOIRY:   Past Surgical History  Procedure Laterality Date  . Dialysis fistula creation      left upper arm fistula  . Amputation      TOES ON BOTH FEET  . Abscess drainage  Behind right ear/occipital scalp  . Skin graft    . Eye surgery  1999  . Coronary artery bypass graft  10/2009    [facility] (Dr. Katrinka Blazing)  . Foot amputation    . Cardiac catheterization    . Cardiac catheterization  2/14    [facility]: severe 3 vessel CAD with patent grafts, RHC: moderately elevated PCW and pulmonary hypertension  . Penile prosthesis implant N/A 07/24/2014    Procedure: SALINE PENILE INJECTION WITH DISSECTION OF CORPORA ,  IMPLANTATION  OF COLOPLAST PENILE PROTHESIS INFLATABLE;  Surgeon: [name], MD;  Location: [facility] ORS;  Service: Urology;  Laterality: N/A;  . Removal of penile prosthesis N/A 08/22/2014    Procedure: EXPLANT OF INFECTED  PENILE PROSTHESIS;  Surgeon: [name], MD;  Location: [facility] ORS;  Service: Urology;  Laterality: N/A;  . Irrigation and debridement abscess Bilateral 12/14/2014    Procedure: Bilateral Corporal Irrigation with Cultures and Drainage, Penrose Drain Insertion;  Surgeon: [name], MD;  Location: [facility] OR;  Service: Urology;  Laterality: Bilateral;  . Scrotal exploration N/A 02/03/2015    Procedure: IRRIGATION AND DEBRIDEMENT SCROTAL/PENILE ABSCESS;  Surgeon: [name], MD;  Location: [facility] OR;  Service: Urology;  Laterality: N/A;  . Cystoscopy N/A 02/03/2015    Procedure: CYSTOSCOPY;  Surgeon: [name],  MD;  Location: [facility] OR;  Service: Urology;  Laterality: N/A;    SOCIAL HISTORY:   History  Substance Use Topics  . Smoking status: Never Smoker   . Smokeless tobacco: Former Neurosurgeon    Types: Chew    Quit date: 08/23/1995  . Alcohol Use: 0.0 oz/week     Comment: occasional beer    FAMILY HISTORY:   Family History  Problem Relation Age of Onset  . Heart disease Other   . Hypertension Other   . Hypertension Mother   . Heart disease Mother   . Diabetes Mother   . Birth defects Paternal Uncle     unaware  . Birth defects Paternal Grandmother     breast    DRUG ALLERGIES:   Allergies  Allergen Reactions  . Amoxicillin-Pot Clavulanate Nausea And Vomiting  . Hydromorphone Hcl Other (See Comments)    Reaction:  Unknown  . Rifampin Nausea And Vomiting    REVIEW OF SYSTEMS:  CONSTITUTIONAL: No fever, fatigue reporting weakness.  EYES: No blurred or double vision.  EARS, NOSE, AND THROAT: No tinnitus or ear pain.  RESPIRATORY: No cough, shortness of breath, wheezing or hemoptysis.  CARDIOVASCULAR: No chest pain, orthopnea, edema.  GASTROINTESTINAL: No nausea, vomiting, diarrhea or abdominal pain.  GENITOURINARY: No dysuria, hematuria.  ENDOCRINE: No polyuria, nocturia,  HEMATOLOGY: No anemia, easy bruising or bleeding SKIN: No rash or lesion. MUSCULOSKELETAL: No joint pain or arthritis.   NEUROLOGIC: No tingling, numbness, weakness.  PSYCHIATRY: No anxiety or depression.   MEDICATIONS AT HOME:   Prior to Admission medications   Medication Sig Start Date End Date Taking? Authorizing Provider  acetaminophen (TYLENOL) 500 MG tablet Take 1,000 mg by mouth every 6 (six) hours as needed for mild pain.    Yes Historical Provider, MD  ALPRAZolam (XANAX) 0.25 MG tablet Take 0.25 mg by mouth every 6 (six) hours as needed for anxiety.   Yes Historical Provider, MD  alum & mag hydroxide-simeth (MAALOX PLUS) 400-400-40 MG/5ML suspension Take 30 mLs by mouth every 4 (four) hours as  needed for indigestion.    Yes Historical Provider, MD  amitriptyline (ELAVIL) 25 MG tablet Take 1 tablet (25 mg total) by mouth at bedtime. 08/21/14  Yes Dianne Dun, MD  amoxicillin-clavulanate (AUGMENTIN) 500-125 MG per tablet Take 1 tablet (500 mg total) by mouth at bedtime. 03/12/15  Yes Altamese Dilling, MD  aspirin 81 MG EC tablet Take 81 mg by mouth daily. Swallow whole.   Yes Historical Provider, MD  calcium carbonate (TUMS - DOSED IN MG ELEMENTAL CALCIUM) 500 MG chewable tablet Chew 1-2 tablets (200-400 mg of elemental calcium total) by mouth 2 (two) times daily as needed for  indigestion or heartburn. Patient taking differently: Chew 2 tablets by mouth every 4 (four) hours as needed for indigestion or heartburn.  12/21/14  Yes Richarda Overlie, MD  carvedilol (COREG) 25 MG tablet Take 1 tablet (25 mg total) by mouth 2 (two) times daily with a meal. 02/12/15  Yes [name], MD  cephALEXin (KEFLEX) 250 MG capsule Take 1 capsule (250 mg total) by mouth every 12 (twelve) hours. 03/12/15  Yes Altamese Dilling, MD  cinacalcet (SENSIPAR) 60 MG tablet Take 60 mg by mouth daily.  02/16/11  Yes Dianne Dun, MD  cloNIDine (CATAPRES) 0.1 MG tablet Take 0.1 mg by mouth 2 (two) times daily.   Yes Historical Provider, MD  fluconazole (DIFLUCAN) 100 MG tablet Take 1 tablet (100 mg total) by mouth daily. 03/12/15  Yes Altamese Dilling, MD  furosemide (LASIX) 80 MG tablet Take 80 mg by mouth 2 (two) times daily.   Yes Historical Provider, MD  HYDROcodone-acetaminophen (NORCO/VICODIN) 5-325 MG per tablet Take 1 tablet by mouth every 6 (six) hours as needed for moderate pain. 03/12/15  Yes Altamese Dilling, MD  insulin aspart (NOVOLOG) 100 UNIT/ML injection Inject 4 Units into the skin 3 (three) times daily with meals. 02/11/15  Yes Nishant Dhungel, MD  insulin aspart (NOVOLOG) 100 UNIT/ML injection Inject 0-7 Units into the skin 4 (four) times daily -  before meals and at bedtime. Patient taking  differently: Inject 0-7 Units into the skin See admin instructions. Pt uses three times a day before meals and at bedtime as needed per sliding scale. 03/12/15  Yes Altamese Dilling, MD  insulin detemir (LEVEMIR) 100 UNIT/ML injection Inject 0.14 mLs (14 Units total) into the skin at bedtime. 03/12/15  Yes Altamese Dilling, MD  isosorbide mononitrate (IMDUR) 30 MG 24 hr tablet Take 30 mg by mouth 2 (two) times daily.   Yes Historical Provider, MD  levothyroxine (SYNTHROID, LEVOTHROID) 75 MCG tablet Take 1 tablet (75 mcg total) by mouth daily before breakfast. 07/26/13  Yes Dianne Dun, MD  lisinopril (PRINIVIL,ZESTRIL) 10 MG tablet Take 10 mg by mouth daily.   Yes Historical Provider, MD  Omega-3 Fatty Acids (FISH OIL) 1000 MG CAPS Take 1 capsule by mouth daily.   Yes Historical Provider, MD  ondansetron (ZOFRAN ODT) 4 MG disintegrating tablet Take 1 tablet (4 mg total) by mouth every 8 (eight) hours as needed for nausea or vomiting. 03/12/15  Yes Altamese Dilling, MD  pantoprazole (PROTONIX) 40 MG tablet Take 40 mg by mouth 2 (two) times daily.   Yes Historical Provider, MD  PARoxetine (PAXIL) 20 MG tablet Take 20 mg by mouth daily.    Yes Historical Provider, MD  promethazine (PHENERGAN) 25 MG tablet Take 1 tablet (25 mg total) by mouth every 6 (six) hours as needed for nausea or vomiting. 03/12/15  Yes Altamese Dilling, MD  rosuvastatin (CRESTOR) 20 MG tablet Take 1 tablet (20 mg total) by mouth at bedtime. 12/13/12  Yes Dianne Dun, MD  Nutritional Supplements (FEEDING SUPPLEMENT, NEPRO CARB STEADY,) LIQD Take 237 mLs by mouth as needed (missed meal during dialysis.). Patient not taking: Reported on 03/13/2015 08/24/14   Richarda Overlie, MD      VITAL SIGNS:  Blood pressure 160/81, pulse 81, temperature 97.5 F (36.4 C), temperature source Oral, resp. rate 20, height 6' (1.829 m), weight 83.915 kg (185 lb), SpO2 100 %.  PHYSICAL EXAMINATION:  GENERAL:  44 y.o.-year-old patient lying in  the bed with no acute distress.  EYES: Pupils  equal, round, reactive to light and accommodation. No scleral icterus. Extraocular muscles intact.  HEENT: Head atraumatic, normocephalic. Oropharynx and nasopharynx clear. Periorbital edema is present which is chronic NECK:  Supple, no jugular venous distention. No thyroid enlargement, no tenderness.  LUNGS: Normal breath sounds bilaterally, no wheezing, rales,rhonchi or crepitation. No use of accessory muscles of respiration.  CARDIOVASCULAR: S1, S2 normal. No murmurs, rubs, or gallops.  ABDOMEN: Soft, nontender, nondistended. Bowel sounds present. No organomegaly or mass.  EXTREMITIES: No pedal edema, cyanosis, or clubbing.  NEUROLOGIC: Cranial nerves II through XII are intact. Muscle strength 5/5 in all extremities. Sensation intact. Gait not checked.  PSYCHIATRIC: The patient is alert and oriented x 3.  SKIN: No obvious rash, lesion, or ulcer. Scrotal erythema, edema and minimal tenderness  LABORATORY PANEL:   CBC  Recent Labs Lab 03/13/15 1544  WBC 5.3  HGB 10.2*  HCT 32.3*  PLT 105*   ------------------------------------------------------------------------------------------------------------------  Chemistries   Recent Labs Lab 03/13/15 1544  NA 136  K 4.7  CL 96*  CO2 31  GLUCOSE 197*  BUN 29*  CREATININE 3.31*  CALCIUM 8.8*   ------------------------------------------------------------------------------------------------------------------  Cardiac Enzymes  Recent Labs Lab 03/13/15 1544  TROPONINI 0.03   ------------------------------------------------------------------------------------------------------------------  RADIOLOGY:  Dg Chest Port 1 View  03/13/2015   CLINICAL DATA:  Coronary artery disease.  MI.  Nausea, chest pain  EXAM: PORTABLE CHEST - 1 VIEW  COMPARISON:  03/03/2015  FINDINGS: There is cardiomegaly. Prior CABG. Diffuse interstitial prominence and perihilar/lower lobe opacities, most compatible  with edema/ CHF. Suspect small effusions. No acute bony abnormality.  IMPRESSION: Mild CHF.   Electronically Signed   By: Charlett Nose M.D.   On: 03/13/2015 17:01    EKG:   Orders placed or performed during the hospital encounter of 03/13/15  . ED EKG  . ED EKG    IMPRESSION AND PLAN:   #1 hematemesis-we will admit the patient to telemetry. Patient is on Protonix drip. We will keep him nothing by mouth. The patient has history of hemodialysis and his vital signs are stable at this point I'm not providing him any IV fluids. We will monitor his hemoglobin and hematocrit every 6 hours. Will check PT/INR. We will transfuse as needed basis. Patient's initial hemoglobin is stable. We will put a consult to gastroenterology. We will check CBC and CMP in a.m. Will transfuse as needed basis. We will provide him antiemetics with Zofran #2 history of scrotal abscess-he was discharged home with by mouth Keflex, Augmentin and Diflucan. It is currently nothing by mouth, will provide him with IV Rocephin and IV Diflucan. Infectious disease consult is placed to Dr. Sampson Goon   #3 history of end-stage renal disease Will continue hemodialysis. Nephrology consult is placed to continue hemodialysis on Tuesday, Thursday and Saturday. #4 insulin requiring diabetes mellitus-patient is currently nothing by mouth. We will provide him insulin sliding scale. We will hold his home medications including long acting insulin #5 history of hypertension-will provide him IV hydralazine as needed basis for elevated blood pressure medicine #6 history of hypothyroidism currently patient is nothing by mouth Will hold his Synthroid at this time  Plan of care discussed with the patient. He is aware of the plan    All the records are reviewed and case discussed with ED provider. Management plans discussed with the patient, family and they are in agreement.  CODE STATUS: Limited code, please review MOST form for details  TOTAL  TIME TAKING CARE OF THIS PATIENT: 50  minutes.    Ramonita Lab M.D on 03/13/2015 at 7:18 PM  Between 7am to 6pm - Pager - [phone]  After 6pm go to [email or web address] - password [email or web address] [facility]  [facility]  Office  [phone]  CC: Primary care physician; [name], MD

## 2015-03-13 NOTE — ED Notes (Signed)
Pt on 2L Jet at all times

## 2015-03-13 NOTE — ED Notes (Signed)
Pt arrives from [facility] with complaints of nausea and vomiting dark red blood, pt also states chest pain that feels like burning, pt dialysis and [facility] unable to recall last time pt had full tx, pt states 7/10 pain

## 2015-03-13 NOTE — ED Provider Notes (Signed)
[facility] Emergency Department Provider Note    ____________________________________________  Time seen: On arrival.  I have reviewed the triage vital signs and the nursing notes.   HISTORY  Chief Complaint Nausea and Chest Pain       HPI ISMAEEL ARVELO is a 44 y.o. male with a history of end-stage renal disease and diabetes who presents today with vomiting one episode with blood streaks just prior to arrival. After vomiting the patient also reported burning chest pain which is midsternal and mild and constant. It is nonradiating. There is no associated shortness of breath. The patient had a recent admission to the hospitalist similar symptoms and was found to be in DKA. There is nothing that makes the pain better or worse. The patient has a history of heart disease. Patient reports being last dialyzed yesterday.      Past Medical History  Diagnosis Date  . End stage renal disease on dialysis     LUE fistula  . Type I diabetes mellitus     a. 03/2014 admitted with HNK to [facility].  . Diabetic neuropathy     severe, s/p multiple toe amputation  . Hypothyroidism   . Hypertension   . Hyperlipidemia   . H/O hiatal hernia   . GERD (gastroesophageal reflux disease)   . Anxiety   . Sebaceous cyst     side of neck  . Pneumonia     2010  . Anemia     a. req PRBC's 2011.  [name] PAD (peripheral artery disease)     a. s/p amputation of toes on the right;  b. left LE claudication.  . Coronary artery disease     a. s/p MI;  b. 10/2009 CABG x 3 @ [facility]: LIMA->LAD, VG->OM3, VG->RPDA; c. 11/2010 Cath 3/3 patent grafts;  d 12/2012 Cath: LM 30d, LAD 85p, D1 70, D2 90, LCX 40ost, OM2 100, RCA 90p, 15m, L->LAD ok, VG->OM3 ok, VG->RPDA 30, EF 50%->Med Rx.  . Cataract     right  . Valvular disease     a. 11/2012 Echo: EF 55-60%, mild LVH, mild MR, mild bi-atrial enlargement, mild-mod TR, PASP .  . CHF (congestive heart failure)   . Depression   . Arthritis      rheumatoid arthritis   . Hx of transfusion of packed red blood cells   . Myocardial infarction 2010  . Anginal pain     Patient Active Problem List   Diagnosis Date Noted  . End stage renal failure on dialysis 03/09/2015  . Diabetic ketoacidosis without coma associated with type 1 diabetes mellitus   . Acute delirium   . Altered mental state   . Accelerated hypertension 02/04/2015  . Scrotal abscess 02/04/2015  . Sepsis affecting skin 02/02/2015  . DKA, type 1 02/02/2015  . UTI (urinary tract infection) 12/11/2014  . Hyperglycemia 12/10/2014  . Volume overload 10/11/2014  . Anxiety state 10/10/2014  . Postoperative infection 08/22/2014  . Hyperlipidemia 08/22/2014  . Uncontrolled diabetes mellitus with peripheral autonomic neuropathy 08/22/2014  . Cellulitis of groin 08/22/2014  . 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 Pulmonary hypertension associated with end stage renal disease on dialysis 04/16/2014  . Tricuspid regurgitation 04/16/2014  . Medicare annual wellness visit, initial 04/09/2014  . Diabetic retinopathy 04/09/2014  . Depression 04/09/2014  . DNR (do not resuscitate) discussion 04/09/2014  . Nonketotic hyperglycinemia 03/26/2014  . Type 1 DM with end-stage renal disease 03/26/2014  . SOB (shortness of breath) 03/26/2014  .  Scabies 03/26/2014  . PNA (pneumonia) 04/18/2012  . Left carotid bruit 02/08/2012  . Sebaceous cyst, neck 02/02/2012  . Diabetic hyperosmolar non-ketotic state 01/04/2012  . Type 1 diabetes mellitus with diabetic foot ulcer 01/04/2012  . Diabetic peripheral neuropathy 01/04/2012  . Hyponatremia 01/04/2012  . Abscess and cellulitis 11/23/2011  . Penile abscess 05/21/2011  . Panic attacks 05/21/2011  . S/P CABG (coronary artery bypass graft) 02/11/2011  . HTN (hypertension) 02/11/2011  . Hypothyroidism 01/26/2011  . HYPERLIPIDEMIA 01/26/2011  . MYOCARDIAL INFARCTION, HX OF 01/26/2011  . End stage renal disease 2/2 to DM ty  1-Dialysis started ~2009--Dialyses t/Th/Sat-[facility] 01/26/2011    Past Surgical History  Procedure Laterality Date  . Dialysis fistula creation      left upper arm fistula  . Amputation      TOES ON BOTH FEET  . Abscess drainage  Behind right ear/occipital scalp  . Skin graft    . Eye surgery  1999  . Coronary artery bypass graft  10/2009    [facility] (Dr. Katrinka Blazing)  . Foot amputation    . Cardiac catheterization    . Cardiac catheterization  2/14    [facility]: severe 3 vessel CAD with patent grafts, RHC: moderately elevated PCW and pulmonary hypertension  . Penile prosthesis implant N/A 07/24/2014    Procedure: SALINE PENILE INJECTION WITH DISSECTION OF CORPORA ,  IMPLANTATION OF COLOPLAST PENILE PROTHESIS INFLATABLE;  Surgeon: [name], MD;  Location: [facility] ORS;  Service: Urology;  Laterality: N/A;  . Removal of penile prosthesis N/A 08/22/2014    Procedure: EXPLANT OF INFECTED  PENILE PROSTHESIS;  Surgeon: [name], MD;  Location: [facility] ORS;  Service: Urology;  Laterality: N/A;  . Irrigation and debridement abscess Bilateral 12/14/2014    Procedure: Bilateral Corporal Irrigation with Cultures and Drainage, Penrose Drain Insertion;  Surgeon: [name], MD;  Location: [facility] OR;  Service: Urology;  Laterality: Bilateral;  . Scrotal exploration N/A 02/03/2015    Procedure: IRRIGATION AND DEBRIDEMENT SCROTAL/PENILE ABSCESS;  Surgeon: [name], MD;  Location: [facility] OR;  Service: Urology;  Laterality: N/A;  . Cystoscopy N/A 02/03/2015    Procedure: CYSTOSCOPY;  Surgeon: [name], MD;  Location: [facility] OR;  Service: Urology;  Laterality: N/A;    Current Outpatient Rx  Name  Route  Sig  Dispense  Refill  . acetaminophen (TYLENOL) 500 MG tablet   Oral   Take 1,000 mg by mouth every 6 (six) hours as needed (pain).         [name] ALPRAZolam (XANAX) 0.25 MG tablet   Oral   Take 0.25 mg by mouth every 6 (six) hours as needed for anxiety.         [name] alum & mag  hydroxide-simeth (MAALOX PLUS) 400-400-40 MG/5ML suspension   Oral   Take 15 mLs by mouth every 4 (four) hours as needed for indigestion.         [name] amitriptyline (ELAVIL) 25 MG tablet   Oral   Take 1 tablet (25 mg total) by mouth at bedtime.   30 tablet   1   . amoxicillin-clavulanate (AUGMENTIN) 500-125 MG per tablet   Oral   Take 1 tablet (500 mg total) by mouth at bedtime.   14 tablet   0   . aspirin 81 MG EC tablet   Oral   Take 81 mg by mouth daily. Swallow whole.         . calcium carbonate (TUMS - DOSED IN MG ELEMENTAL CALCIUM) 500  MG chewable tablet   Oral   Chew 1-2 tablets (200-400 mg of elemental calcium total) by mouth 2 (two) times daily as needed for indigestion or heartburn.   1 tablet   0   . carvedilol (COREG) 25 MG tablet   Oral   Take 1 tablet (25 mg total) by mouth 2 (two) times daily with a meal.   90 tablet   0   . cephALEXin (KEFLEX) 250 MG capsule   Oral   Take 1 capsule (250 mg total) by mouth every 12 (twelve) hours.   14 capsule   0   . cinacalcet (SENSIPAR) 60 MG tablet   Oral   Take 60 mg by mouth at bedtime. With biggest meal         . cloNIDine (CATAPRES) 0.1 MG tablet   Oral   Take 0.1 mg by mouth 2 (two) times daily.         . fluconazole (DIFLUCAN) 100 MG tablet   Oral   Take 1 tablet (100 mg total) by mouth daily.   7 tablet   0   . furosemide (LASIX) 80 MG tablet   Oral   Take 80 mg by mouth 2 (two) times daily.         [name] HYDROcodone-acetaminophen (NORCO/VICODIN) 5-325 MG per tablet   Oral   Take 1 tablet by mouth every 6 (six) hours as needed for moderate pain.   15 tablet   0   . insulin aspart (NOVOLOG) 100 UNIT/ML injection   Subcutaneous   Inject 4 Units into the skin 3 (three) times daily with meals.   10 mL   0   . insulin aspart (NOVOLOG) 100 UNIT/ML injection   Subcutaneous   Inject 0-7 Units into the skin 4 (four) times daily -  before meals and at bedtime.   10 mL   11   . insulin  detemir (LEVEMIR) 100 UNIT/ML injection   Subcutaneous   Inject 0.14 mLs (14 Units total) into the skin at bedtime.   10 mL   11   . isosorbide mononitrate (IMDUR) 30 MG 24 hr tablet   Oral   Take 30 mg by mouth 2 (two) times daily.         [name] levothyroxine (SYNTHROID, LEVOTHROID) 75 MCG tablet   Oral   Take 1 tablet (75 mcg total) by mouth daily before breakfast.   90 tablet   1   . lisinopril (PRINIVIL,ZESTRIL) 10 MG tablet   Oral   Take 10 mg by mouth daily.         . Nutritional Supplements (FEEDING SUPPLEMENT, NEPRO CARB STEADY,) LIQD   Oral   Take 237 mLs by mouth as needed (missed meal during dialysis.).   30 Can   0   . Omega-3 Fatty Acids (OMEGA 3 PO)   Oral   Take 1,000 mg by mouth daily.         . ondansetron (ZOFRAN ODT) 4 MG disintegrating tablet   Oral   Take 1 tablet (4 mg total) by mouth every 8 (eight) hours as needed for nausea or vomiting.   20 tablet   0   . pantoprazole (PROTONIX) 40 MG tablet   Oral   Take 40 mg by mouth 2 (two) times daily.         [name] PARoxetine (PAXIL) 20 MG tablet   Oral   Take 20 mg by mouth every morning.          [name]  promethazine (PHENERGAN) 25 MG tablet   Oral   Take 1 tablet (25 mg total) by mouth every 6 (six) hours as needed for nausea or vomiting.   30 tablet   0   . rosuvastatin (CRESTOR) 20 MG tablet   Oral   Take 1 tablet (20 mg total) by mouth at bedtime.   30 tablet   5   . sevelamer carbonate (RENVELA) 800 MG tablet   Oral   Take 800 mg by mouth 3 (three) times daily with meals.           Allergies Amoxicillin-pot clavulanate; Hydromorphone hcl; and Rifampin  Family History  Problem Relation Age of Onset  . Heart disease Other   . Hypertension Other   . Hypertension Mother   . Heart disease Mother   . Diabetes Mother   . Birth defects Paternal Uncle     unaware  . Birth defects Paternal Grandmother     breast    Social History History  Substance Use Topics  . Smoking  status: Never Smoker   . Smokeless tobacco: Former Neurosurgeon    Types: Chew    Quit date: 08/23/1995  . Alcohol Use: 0.0 oz/week     Comment: occasional beer    Review of Systems  Constitutional: Negative for fever. Eyes: Negative for visual changes. ENT: Negative for sore throat. Cardiovascular: Mild burning chest pain. Respiratory: Negative for shortness of breath. Gastrointestinal: Negative for abdominal pain, had one episode of blood-streaked vomit this morning. Does not report any blood in the stool. Genitourinary: Negative for dysuria. Musculoskeletal: Negative for back pain. Skin: Negative for rash. Neurological: Negative for headaches, focal weakness or numbness.   10-point ROS otherwise negative.  ____________________________________________   PHYSICAL EXAM:  VITAL SIGNS: ED Triage Vitals  Enc Vitals Group     BP 03/13/15 1544 182/82 mmHg     Pulse Rate 03/13/15 1544 82     Resp --      Temp 03/13/15 1544 97.5 F (36.4 C)     Temp Source 03/13/15 1544 Oral     SpO2 03/13/15 1544 95 %     Weight 03/13/15 1544 185 lb (83.915 kg)     Height 03/13/15 1544 6' (1.829 m)     Head Cir --      Peak Flow --      Pain Score --      Pain Loc --      Pain Edu? --      Excl. in GC? --      Constitutional: Alert and oriented. Well appearing and in no distress. Eyes: Conjunctivae are normal. PERRL. Normal extraocular movements. ENT   Head: Normocephalic and atraumatic.   Nose: No congestion/rhinnorhea/Throat: Mucous membranes are moist.   Neck: No stridor. Hematological/Lymphatic/Immunilogical: No cervical lymphadenopathy. Cardiovascular: Normal rate, regular rhythm. Normal and symmetric distal pulses are present in all extremities. No murmurs, rubs, or gallops. Respiratory: Normal respiratory effort without tachypnea nor retractions. Breath sounds are clear and equal bilaterally. No wheezes/rales/rhonchi. Gastrointestinal: Mildly distended abdomen without  tenderness to palpation. Genitourinary: Drains in place from what appears to be previous I&D of the left side of the scrotum. There was no erythema or pus drainage. No tenderness to palpation. Superpubic catheter is in place. Musculoskeletal: Nontender with normal range of motion in all extremities. No joint effusions.  No lower extremity tenderness. Mild bilateral lower extremity edema. Neurologic:  Normal speech and language. No gross focal neurologic deficits are appreciated. Speech is normal. No  gait instability. Skin:  Skin is warm, dry and intact. No rash noted. Psychiatric: Mood and affect are normal. Speech and behavior are normal. Patient exhibits appropriate insight and judgment.  ____________________________________________    LABS (pertinent positives/negatives)  Hemoglobin slightly increased since last visit. Normal potassium.  ____________________________________________   EKG  ED ECG REPORT   Date: 03/13/2015  EKG Time: 1533  Rate: 80  Rhythm: Normal sinus rhythm with an incomplete right bundle branch block which is unchanged from previous. EKG reviewed was from 424. T waves in V3 and V4 now upright.  Axis: Normal axis  Intervals: Borderline widened QRS  ST&T Change: Upright T waves in V3 and V4 which are now changed from previous. Persistent V2 T-wave inversion.   ____________________________________________    RADIOLOGY  Mild CHF on chest x-ray  ____________________________________________   PROCEDURES    ____________________________________________   INITIAL IMPRESSION / ASSESSMENT AND PLAN / ED COURSE  Pertinent labs & imaging results that were available during my care of the patient were reviewed by me and considered in my medical decision making (see chart for details).  Patient without vomiting so far in the emergency department as of 4:31 PM. Patient was given a GI cocktail. We will await the patient's lab work. We'll trend troponins to make  sure no uptrending in blood work.  ----------------------------------------- 5:01 PM on 03/13/2015 -----------------------------------------  Patient vomited a large amount of coffee grounds in radiology while getting chest x-ray. We'll start Protonix bolus and infusion. We'll admit to the hospital for GI bleed.  ____________________________________________   FINAL CLINICAL IMPRESSION(S) / ED DIAGNOSES  Coffee-ground emesis, upper GI bleed. Acute, initial visit.    Arelia Longest, MD 03/13/15 [phone]

## 2015-03-14 ENCOUNTER — Other Ambulatory Visit: Payer: Self-pay | Admitting: Unknown Physician Specialty

## 2015-03-14 LAB — CBC
HCT: 31.4 % — ABNORMAL LOW (ref 40.0–52.0)
HEMATOCRIT: 32 % — AB (ref 40.0–52.0)
HEMOGLOBIN: 9.9 g/dL — AB (ref 13.0–18.0)
HEMOGLOBIN: 10 g/dL — AB (ref 13.0–18.0)
MCH: 29 pg (ref 26.0–34.0)
MCH: 28.9 pg (ref 26.0–34.0)
MCHC: 31.4 g/dL — AB (ref 32.0–36.0)
MCHC: 31.3 g/dL — ABNORMAL LOW (ref 32.0–36.0)
MCV: 92.4 fL (ref 80.0–100.0)
MCV: 92.4 fL (ref 80.0–100.0)
PLATELETS: 98 10*3/uL — AB (ref 150–440)
Platelets: 101 10*3/uL — ABNORMAL LOW (ref 150–440)
RBC: 3.4 MIL/uL — AB (ref 4.40–5.90)
RBC: 3.46 MIL/uL — ABNORMAL LOW (ref 4.40–5.90)
RDW: 17.3 % — AB (ref 11.5–14.5)
RDW: 17.5 % — AB (ref 11.5–14.5)
WBC: 5.4 10*3/uL (ref 3.8–10.6)
WBC: 5.4 10*3/uL (ref 3.8–10.6)

## 2015-03-14 LAB — GLUCOSE, CAPILLARY
GLUCOSE-CAPILLARY: 221 mg/dL — AB (ref 70–99)
GLUCOSE-CAPILLARY: 92 mg/dL (ref 70–99)
GLUCOSE-CAPILLARY: 45 mg/dL — AB (ref 70–99)
GLUCOSE-CAPILLARY: 47 mg/dL — AB (ref 70–99)
Glucose-Capillary: 221 mg/dL — ABNORMAL HIGH (ref 70–99)
Glucose-Capillary: 56 mg/dL — ABNORMAL LOW (ref 70–99)
Glucose-Capillary: 48 mg/dL — ABNORMAL LOW (ref 70–99)
Glucose-Capillary: 142 mg/dL — ABNORMAL HIGH (ref 70–99)

## 2015-03-14 LAB — HEMOGLOBIN: Hemoglobin: 9.8 g/dL — ABNORMAL LOW (ref 13.0–18.0)

## 2015-03-14 LAB — RENAL FUNCTION PANEL
Albumin: 2.3 g/dL — ABNORMAL LOW (ref 3.5–5.0)
Anion gap: 12 (ref 5–15)
BUN: 32 mg/dL — ABNORMAL HIGH (ref 6–20)
CHLORIDE: 93 mmol/L — AB (ref 101–111)
CO2: 28 mmol/L (ref 22–32)
Calcium: 8.5 mg/dL — ABNORMAL LOW (ref 8.9–10.3)
Creatinine, Ser: 3.68 mg/dL — ABNORMAL HIGH (ref 0.61–1.24)
GFR calc Af Amer: 21 mL/min — ABNORMAL LOW (ref >60)
GFR, EST NON AFRICAN AMERICAN: 18 mL/min — AB (ref >60)
Glucose, Bld: 224 mg/dL — ABNORMAL HIGH (ref 65–99)
Phosphorus: 3.4 mg/dL (ref 2.5–4.6)
Potassium: 4.9 mmol/L (ref 3.5–5.1)
SODIUM: 133 mmol/L — AB (ref 135–145)

## 2015-03-14 LAB — HEMATOCRIT: HEMATOCRIT: 31.2 % — AB (ref 40.0–52.0)

## 2015-03-14 LAB — BASIC METABOLIC PANEL
ANION GAP: 10 (ref 5–15)
BUN: 30 mg/dL — ABNORMAL HIGH (ref 6–20)
CHLORIDE: 94 mmol/L — AB (ref 101–111)
CO2: 29 mmol/L (ref 22–32)
Calcium: 8.4 mg/dL — ABNORMAL LOW (ref 8.9–10.3)
Creatinine, Ser: 3.47 mg/dL — ABNORMAL HIGH (ref 0.61–1.24)
GFR calc non Af Amer: 20 mL/min — ABNORMAL LOW (ref >60)
GFR, EST AFRICAN AMERICAN: 23 mL/min — AB (ref >60)
Glucose, Bld: 176 mg/dL — ABNORMAL HIGH (ref 65–99)
Potassium: 4.6 mmol/L (ref 3.5–5.1)
SODIUM: 133 mmol/L — AB (ref 135–145)

## 2015-03-14 LAB — PROTIME-INR
INR: 1.23
Prothrombin Time: 15.7 seconds — ABNORMAL HIGH (ref 11.4–15.0)

## 2015-03-14 MED ORDER — DEXTROSE 50 % IV SOLN: 25.0000 mL | Freq: Once | INTRAVENOUS | Status: DC

## 2015-03-14 MED ORDER — SODIUM CHLORIDE 0.9 % IV SOLN: 100.0000 mL | INTRAVENOUS | Status: DC | PRN

## 2015-03-14 MED ORDER — INSULIN ASPART 100 UNIT/ML ~~LOC~~ SOLN
0.0000 [IU] | Freq: Three times a day (TID) | SUBCUTANEOUS | Status: DC
  Administered 2015-03-14: 13:00:00 5 [IU] via SUBCUTANEOUS
  Administered 2015-03-15: 2 [IU] via SUBCUTANEOUS
  Administered 2015-03-15: 18:00:00 8 [IU] via SUBCUTANEOUS
  Administered 2015-03-16: 11 [IU] via SUBCUTANEOUS
  Administered 2015-03-16: 2 [IU] via SUBCUTANEOUS
  Administered 2015-03-17 (×2): 5 [IU] via SUBCUTANEOUS
  Administered 2015-03-17: 09:00:00 3 [IU] via SUBCUTANEOUS
  Administered 2015-03-18: 18:00:00 8 [IU] via SUBCUTANEOUS
  Administered 2015-03-18: 14:00:00 3 [IU] via SUBCUTANEOUS
  Administered 2015-03-19: 8 [IU] via SUBCUTANEOUS
  Administered 2015-03-19: 17:00:00 3 [IU] via SUBCUTANEOUS
  Filled 2015-03-14 (×2): qty 3
  Filled 2015-03-14: qty 8
  Filled 2015-03-14: qty 11
  Filled 2015-03-14: qty 5
  Filled 2015-03-14: qty 8
  Filled 2015-03-14: qty 2
  Filled 2015-03-14 (×3): qty 5
  Filled 2015-03-14: qty 3

## 2015-03-14 MED ORDER — INSULIN DETEMIR 100 UNIT/ML ~~LOC~~ SOLN
14.0000 [IU] | Freq: Every day | SUBCUTANEOUS | Status: DC
  Administered 2015-03-14: 14 [IU] via SUBCUTANEOUS
  Filled 2015-03-14 (×2): qty 0.14

## 2015-03-14 MED ORDER — SODIUM CHLORIDE 0.9 % IV SOLN
1.5000 g | Freq: Two times a day (BID) | INTRAVENOUS | Status: DC
  Administered 2015-03-15 – 2015-03-16 (×3): 1.5 g via INTRAVENOUS
  Filled 2015-03-14 (×6): qty 1.5

## 2015-03-14 MED ORDER — DEXTROSE 10 % IV SOLN
INTRAVENOUS | Status: DC
  Administered 2015-03-14: 21:00:00 via INTRAVENOUS

## 2015-03-14 MED ORDER — EPOETIN ALFA 10000 UNIT/ML IJ SOLN
10000.0000 [IU] | Freq: Once | INTRAMUSCULAR | Status: AC
  Administered 2015-03-14: 10000 [IU] via INTRAVENOUS
  Filled 2015-03-14: qty 1

## 2015-03-14 MED ORDER — DEXTROSE 50 % IV SOLN
50.0000 mL | Freq: Once | INTRAVENOUS | Status: AC
  Administered 2015-03-14: 21:00:00 50 mL via INTRAVENOUS

## 2015-03-14 MED ORDER — DEXTROSE 50 % IV SOLN
INTRAVENOUS | Status: AC
  Administered 2015-03-14: 21:00:00 50 mL
  Filled 2015-03-14: qty 50

## 2015-03-14 MED ORDER — INSULIN DETEMIR 100 UNIT/ML ~~LOC~~ SOLN
14.0000 [IU] | Freq: Every day | SUBCUTANEOUS | Status: DC
  Filled 2015-03-14 (×3): qty 0.14

## 2015-03-14 NOTE — Plan of Care (Signed)
Problem: Discharge Progression Outcomes Goal: Other Discharge Outcomes/Goals Plan of care progress to goal for: GI bleed - No active bleeding this shift - No complaints of pain - VSS

## 2015-03-14 NOTE — Care Management (Signed)
Admitted to [facility] with the diagnosis of hematemesis. Discharged from this facility 03/12/15 to [facility]. Established dialysis patient. Receives treatments at [facility] on [address] on Tuesday-Thursday-Saturday. Father is Roanna Epley [phone]). Supra Pubic cathether in place. Gwenette Greet RN MSN Care Management [phone]

## 2015-03-14 NOTE — Progress Notes (Signed)
Patient readmitted yesterday from SNF bed at [facility] where he was just discharged back to on 03/12/15. Patient is currently in HD- full assessment to follow. FL2 updated and placed on unit chart for MD Signature.  Reece Levy, MSW, Theresia Majors [phone]

## 2015-03-14 NOTE — Plan of Care (Signed)
Problem: Discharge Progression Outcomes Goal: Discharge plan in place and appropriate Individualization of care 1. Has a history of diabetes, renal failure, depression and Hypertension controlled by medications. 2. 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 3. Lives at [facility] facility.

## 2015-03-14 NOTE — Progress Notes (Signed)
[facility]  ROUNDING NOTE   Subjective:   Readmitted last night for hemoptysis.  Discharged yesterday to SNF. Hemodialysis yesterday but got off treatment early.  Currently with increased volume overload.   Objective:  Vital signs in last 24 hours:  Temp:  [97.5 F (36.4 C)-98.2 F (36.8 C)] 97.6 F (36.4 C) (05/05 0504) Pulse Rate:  [79-83] 82 (05/05 0504) Resp:  [18-20] 18 (05/05 0504) BP: (159-182)/(73-88) 167/80 mmHg (05/05 0504) SpO2:  [95 %-100 %] 100 % (05/05 0755) Weight:  [83.915 kg (185 lb)-98.93 kg (218 lb 1.6 oz)] 98.93 kg (218 lb 1.6 oz) (05/05 0500)  Weight change:  Filed Weights   03/13/15 1544 03/13/15 2300 03/14/15 0500  Weight: 83.915 kg (185 lb) 98.93 kg (218 lb 1.6 oz) 98.93 kg (218 lb 1.6 oz)    Intake/Output: I/O last 3 completed shifts: In: -  Out: 1 [Stool:1]   Intake/Output this shift:  Total I/O In: -  Out: 1 [Stool:1]  General: Not in acute distress Head: Taylor [name]/AT, poor dentition, moist oral mucosal membranes Neck: supple, trachea midline CVS: regular rate and rhythm, no murmurs Resp: clear to auscultation bilaterally ABD: soft, nontender EXT: 2+ bilateral lower extremity edema Neuro: nonfocal. GU: scrotal edema Access: left arm AVF   Basic Metabolic Panel:  Recent Labs Lab 03/08/15 0425  03/11/15 0900 03/13/15 1544 03/14/15 0202 03/14/15 0844  NA 135  --  130* 136 133* 133*  K 4.1  --  5.1 4.7 4.6 4.9  CL 96*  --  94* 96* 94* 93*  CO2 30  --  28 31 29 28   GLUCOSE 59*  --  277* 197* 176* 224*  BUN 30*  --  42* 29* 30* 32*  CREATININE 3.30*  --  3.61* 3.31* 3.47* 3.68*  CALCIUM 7.8*  < > 8.4* 8.8* 8.4* 8.5*  PHOS  --   --  3.3  --   --  3.4  < > = values in this interval not displayed.  Liver Function Tests:  Recent Labs Lab 03/11/15 0900 03/14/15 0844  ALBUMIN 2.2* 2.3*   No results for input(s): LIPASE, AMYLASE in the last 168 hours. No results for input(s): AMMONIA in the last 168  hours.  CBC:  Recent Labs Lab 03/11/15 0900 03/11/15 2311 03/13/15 1544 03/14/15 0152 03/14/15 0202 03/14/15 0848  WBC 5.6 5.1 5.3  --  5.4 5.4  HGB 9.1* 9.4* 10.2* 9.8* 9.9* 10.0*  HCT 28.3* 30.1* 32.3* 31.2* 31.4* 32.0*  MCV 92.2 92.7 92.1  --  92.4 92.4  PLT 89* 90* 105*  --  101* 98*    Cardiac Enzymes:  Recent Labs Lab 03/13/15 1544  TROPONINI 0.03    BNP: Invalid input(s): POCBNP  CBG:  Recent Labs Lab 03/12/15 0744 03/12/15 0957 03/12/15 1302 03/13/15 2243 03/14/15 0743  GLUCAP 161* 119* 72 173* 221*    Microbiology: Results for orders placed or performed during the hospital encounter of 03/03/15  Urine culture     Status: None   Collection Time: 03/03/15  3:56 PM  Result Value Ref Range Status   Micro Text Report   Final       SOURCE: CLEAN CATCH    ORGANISM 1                20,000 CFU/ML Candida albicans   COMMENT                   -   ANTIBIOTIC  ORG#1                                               Culture, blood (single)     Status: None   Collection Time: 03/03/15  3:56 PM  Result Value Ref Range Status   Micro Text Report   Final       COMMENT                   NO GROWTH AEROBICALLY/ANAEROBICALLY IN 5 DAYS   ANTIBIOTIC                                                      Culture, blood (single)     Status: None   Collection Time: 03/03/15  6:15 PM  Result Value Ref Range Status   Micro Text Report   Final       COMMENT                   NO GROWTH AEROBICALLY/ANAEROBICALLY IN 5 DAYS   ANTIBIOTIC                                                        Coagulation Studies:  Recent Labs  03/14/15 0202  LABPROT 15.7*  INR 1.23    Urinalysis: No results for input(s): COLORURINE, LABSPEC, PHURINE, GLUCOSEU, HGBUR, BILIRUBINUR, KETONESUR, PROTEINUR, UROBILINOGEN, NITRITE, LEUKOCYTESUR in the last 72 hours.  Invalid input(s): APPERANCEUR    Imaging: Dg Chest Port 1 View  03/13/2015   CLINICAL DATA:   Coronary artery disease.  MI.  Nausea, chest pain  EXAM: PORTABLE CHEST - 1 VIEW  COMPARISON:  03/03/2015  FINDINGS: There is cardiomegaly. Prior CABG. Diffuse interstitial prominence and perihilar/lower lobe opacities, most compatible with edema/ CHF. Suspect small effusions. No acute bony abnormality.  IMPRESSION: Mild CHF.   Electronically Signed   By: Charlett Nose M.D.   On: 03/13/2015 17:01     Medications:   . pantoprozole (PROTONIX) infusion 8 mg/hr (03/14/15 0634)   . cefTRIAXone (ROCEPHIN)  IV  1 g Intravenous Q24H  . epoetin (EPOGEN/PROCRIT) injection  10,000 Units Intravenous Once  . fluconazole (DIFLUCAN) IV  100 mg Intravenous Q24H  . insulin aspart  0-15 Units Subcutaneous TID WC  . insulin detemir  14 Units Subcutaneous QHS   sodium chloride, sodium chloride, hydrALAZINE, LORazepam  Assessment/ Plan:  44 y.o. 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 TTS  1. ESRD on HD: With significant edema. Seems to be drinking a lot and has increased intradialytic weight gain. Getting off dialysis early - did yesterday and on Saturday.  Plan for hemodialysis treatment on TTS schedule. HD treatment later today.    2. Anemia of CKD: continue epogen with HD.  hgb 10   3. SHPTH: last phos 3.4, pt not on binders at the moment.  4. Hypertension: well controlled currently. History of difficult to control - Home regimen of clonidine, carvedilol, lisinopril, hydralazine. imdur   LOS: 1 Taylor [name] 5/5/201611:02 AM

## 2015-03-14 NOTE — Progress Notes (Signed)
[facility] - [place] at [facility]   PATIENT NAME: Taylor [name]    MR#:  [id number]  DATE OF BIRTH:  Oct 11, 1971  SUBJECTIVE:  Patient presented with hematemesis. Patient no longer has hematemesis. He is hungry.  REVIEW OF SYSTEMS:  CONSTITUTIONAL: No fever, fatigue or weakness.  RESPIRATORY: No cough, shortness of breath, wheezing or hemoptysis.  CARDIOVASCULAR: No chest pain, orthopnea, edema.  GASTROINTESTINAL: No nausea, vomiting, diarrhea or abdominal pain.  he has had hematemesis but now resolved.  GENITOURINARY: No dysuria, hematuria.  MUSCULOSKELETAL: No joint pain or arthritis.   NEUROLOGIC: No tingling, numbness, weakness.   GU: Positive scrotal abscess   DRUG ALLERGIES:   Allergies  Allergen Reactions  . Amoxicillin-Pot Clavulanate Nausea And Vomiting  . Hydromorphone Hcl Other (See Comments)    Reaction:  Unknown  . Rifampin Nausea And Vomiting    VITALS:  Blood pressure 167/80, pulse 82, temperature 97.6 F (36.4 C), temperature source Oral, resp. rate 18, height 6' (1.829 m), weight 98.93 kg (218 lb 1.6 oz), SpO2 100 %.  PHYSICAL EXAMINATION:  GENERAL:  44 y.o.-year-old patient lying in the bed with no acute distress.  EYES: Pupils equal, round, reactive to light and accommodation. No scleral icterus. Extraocular muscles intact.  HEENT: Head atraumatic, normocephalic. Oropharynx and nasopharynx clear.  NECK:  Supple, no jugular venous distention. No thyroid enlargement, no tenderness.  LUNGS: Normal breath sounds bilaterally, no wheezing, rales,rhonchi or crepitation. No use of accessory muscles of respiration.  CARDIOVASCULAR: S1, S2 normal. No murmurs, rubs, or gallops.  ABDOMEN: Soft, nontender, nondistended. Bowel sounds present. No organomegaly or mass.  EXTREMITIENotable for amputation of his toes NEUROLOGIC: Cranial nerves II through XII are intact.  PSYCHIATRIC: The patient is alert and oriented x 3.  SKIN: No obvious  rash, lesion, or ulcer.  he has numerous tattoos and a scar on his left arm    LABORATORY PANEL:   CBC  Recent Labs Lab 03/14/15 0848  WBC 5.4  HGB 10.0*  HCT 32.0*  PLT 98*   ------------------------------------------------------------------------------------------------------------------  Chemistries   Recent Labs Lab 03/14/15 0844  NA 133*  K 4.9  CL 93*  CO2 28  GLUCOSE 224*  BUN 32*  CREATININE 3.68*  CALCIUM 8.5*   ------------------------------------------------------------------------------------------------------------------  Cardiac Enzymes  Recent Labs Lab 03/13/15 1544  TROPONINI 0.03   ------------------------------------------------------------------------------------------------------------------  RADIOLOGY:  Dg Chest Port 1 View  03/13/2015   CLINICAL DATA:  Coronary artery disease.  MI.  Nausea, chest pain  EXAM: PORTABLE CHEST - 1 VIEW  COMPARISON:  03/03/2015  FINDINGS: There is cardiomegaly. Prior CABG. Diffuse interstitial prominence and perihilar/lower lobe opacities, most compatible with edema/ CHF. Suspect small effusions. No acute bony abnormality.  IMPRESSION: Mild CHF.   Electronically Signed   By: Charlett Nose M.D.   On: 03/13/2015 17:01      ASSESSMENT AND PLAN:   1.hematemesis: Patient is on Protonix drip. I will changes to Protonix 40 IV every 12 hours. Patient's currently nothing by mouth. Patient will have a GI consultation. We will continue to monitor hemoglobin. So far his hemoglobin has been stable. 2. History of scrotal abscess: Patient was recently discharged on Keflex and Augmentin as well as Diflucan. He's currently on IV Rocephin and IV give Diflucan which we will continue. Infectious disease consultation has been placed. 3. History of end-stage renal disease: Nephrology has been consulted for hemodialysis. 4. His endocrine type 1 diabetes: Patient is on Levemir 14 units 8S I will continue this.  I appreciate diabetes  consultation. We will continue to monitor Accu-Cheks. Patient is on a sinus Collinson. 5. Hypertension patient is on IV hydralazine when necessary. I can restart his outpatient medications. 6. Hypothyroidism I will restart his Synthroid.    All the records are reviewed and case discussed with Care Management/Social Worker. Management plans discussed with the patient and he is in agreement.  CODE STATUS: full TOTAL TIME TAKING CARE OF THIS PATIENT 35 MINUTES  POSSIBLE D/C IN ONE TO 2  DAYS, DEPENDING ON CLINICAL CONDITION.   Zenita Kister M.D on 03/14/2015 at 11:00 AM  Between 7am to 6pm - Pager - [phone]  After 6pm go to [email or web address] - password [email or web address]  [place] [facility]  Office  [phone]  CC: Primary care physician; [name], MD

## 2015-03-14 NOTE — Consult Note (Signed)
Consultation  Referring Provider: Dr. Janene Madeira  Primary Care Physician:  [name], MD Consulting  Gastroenterologist: Dr. Lynnae Prude Reason for Consultation: Hematemesis         HPI:   Taylor [name] is a 44 y.o. male who was admitted to the hospital 4/242016  through 03/12/2015 for DKA, uncontrolled diabetes mellitus, end-stage renal disease, hypertension, hypokalemia, and scrotal infection on antibiotic therapy. He was  discharged from [facility]  two days ago. He reports vomiting blood 2 days last week 3-4 times. The blood was coffee ground colored and  associated with nausea but no pain. No melena. He says it stopped , only to start again yesterday 3 hours after breakfast. He had nausea, no pain and vomited coffee grounds fairly large amount. He continued to have vomiting x 5-no bright red.   He came to the ER with Hgb 9.9 and stable vitals. He has not had any further vomiting. He denies any abdominal pain. He is hungry and requesting food.  In addition to the vomiting, he reports diarrhea 4-5 times a day. Normal brown, no melena, no nocturnal stools. He takes chronic omeprazole for heartburn and that controls his symptoms very well. He has weight gain and is going to dialysis now.  He denies prior history of EGD or colonoscopy.  Past Medical History  Diagnosis Date  . End stage renal disease on dialysis     LUE fistula  . Type I diabetes mellitus     a. 03/2014 admitted with HNK to [facility].  . Diabetic neuropathy     severe, s/p multiple toe amputation  . Hypothyroidism   . Hypertension   . Hyperlipidemia   . H/O hiatal hernia   . GERD (gastroesophageal reflux disease)   . Anxiety   . Sebaceous cyst     side of neck  . Pneumonia     2010  . Anemia     a. req PRBC's 2011.  [name] PAD (peripheral artery disease)     a. s/p amputation of toes on the right;  b. left LE claudication.  . Coronary artery disease     a. s/p MI;  b. 10/2009 CABG x 3 @ [facility]: LIMA->LAD, VG->OM3, VG->RPDA;  c. 11/2010 Cath 3/3 patent grafts;  d 12/2012 Cath: LM 30d, LAD 85p, D1 70, D2 90, LCX 40ost, OM2 100, RCA 90p, 148m, L->LAD ok, VG->OM3 ok, VG->RPDA 30, EF 50%->Med Rx.  . Cataract     right  . Valvular disease     a. 11/2012 Echo: EF 55-60%, mild LVH, mild MR, mild bi-atrial enlargement, mild-mod TR, PASP .  . CHF (congestive heart failure)   . Depression   . Arthritis     rheumatoid arthritis   . Hx of transfusion of packed red blood cells   . Myocardial infarction 2010  . Anginal pain     Past Surgical History  Procedure Laterality Date  . Dialysis fistula creation      left upper arm fistula  . Amputation      TOES ON BOTH FEET  . Abscess drainage  Behind right ear/occipital scalp  . Skin graft    . Eye surgery  1999  . Coronary artery bypass graft  10/2009    [facility] (Dr. Katrinka Blazing)  . Foot amputation    . Cardiac catheterization    . Cardiac catheterization  2/14    [facility]: severe 3 vessel CAD with patent grafts, RHC: moderately elevated PCW and pulmonary hypertension  . Penile prosthesis implant  N/A 07/24/2014    Procedure: SALINE PENILE INJECTION WITH DISSECTION OF CORPORA ,  IMPLANTATION OF COLOPLAST PENILE PROTHESIS INFLATABLE;  Surgeon: [name], MD;  Location: [facility] ORS;  Service: Urology;  Laterality: N/A;  . Removal of penile prosthesis N/A 08/22/2014    Procedure: EXPLANT OF INFECTED  PENILE PROSTHESIS;  Surgeon: [name], MD;  Location: [facility] ORS;  Service: Urology;  Laterality: N/A;  . Irrigation and debridement abscess Bilateral 12/14/2014    Procedure: Bilateral Corporal Irrigation with Cultures and Drainage, Penrose Drain Insertion;  Surgeon: [name], MD;  Location: [facility] OR;  Service: Urology;  Laterality: Bilateral;  . Scrotal exploration N/A 02/03/2015    Procedure: IRRIGATION AND DEBRIDEMENT SCROTAL/PENILE ABSCESS;  Surgeon: [name], MD;  Location[facility];  Service: Urology;  Laterality: N/A;  . Cystoscopy N/A 02/03/2015     Procedure: CYSTOSCOPY;  Surgeon: [name], MD;  Location: [facility] OR;  Service: Urology;  Laterality: N/A;    Family History  Problem Relation Age of Onset  . Heart disease Other   . Hypertension Other   . Hypertension Mother   . Heart disease Mother   . Diabetes Mother   . Birth defects Paternal Uncle     unaware  . Birth defects Paternal Grandmother     breast     History  Substance Use Topics  . Smoking status: Never Smoker   . Smokeless tobacco: Former Neurosurgeon    Types: Chew    Quit date: 08/23/1995  . Alcohol Use: 0.0 oz/week     Comment: occasional beer    Prior to Admission medications   Medication Sig Start Date End Date Taking? Authorizing Provider  acetaminophen (TYLENOL) 500 MG tablet Take 1,000 mg by mouth every 6 (six) hours as needed for mild pain.    Yes Historical Provider, MD  Alprazolam (XANAX) 0.25 MG tablet Take 0.25 mg by mouth every 6 (six) hours as needed for anxiety.   Yes Historical Provider, MD  alum & mag hydroxide-simeth (MAALOX PLUS) 400-400-40 MG/5ML suspension Take 30 mLs by mouth every 4 (four) hours as needed for indigestion.    Yes Historical Provider, MD  amitriptyline (ELAVIL) 25 MG tablet Take 1 tablet (25 mg total) by mouth at bedtime. 08/21/14  Yes Dianne Dun, MD  amoxicillin-clavulanate (AUGMENTIN) 500-125 MG per tablet Take 1 tablet (500 mg total) by mouth at bedtime. 03/12/15  Yes Altamese Dilling, MD  aspirin 81 MG EC tablet Take 81 mg by mouth daily. Swallow whole.   Yes Historical Provider, MD  calcium carbonate (TUMS - DOSED IN MG ELEMENTAL CALCIUM) 500 MG chewable tablet Chew 1-2 tablets (200-400 mg of elemental calcium total) by mouth 2 (two) times daily as needed for indigestion or heartburn. Patient taking differently: Chew 2 tablets by mouth every 4 (four) hours as needed for indigestion or heartburn.  12/21/14  Yes Richarda Overlie, MD  carvedilol (COREG) 25 MG tablet Take 1 tablet (25 mg total) by mouth 2 (two) times daily with a  meal. 02/12/15  Yes [name], MD  cephALEXin (KEFLEX) 250 MG capsule Take 1 capsule (250 mg total) by mouth every 12 (twelve) hours. 03/12/15  Yes Altamese Dilling, MD  cinacalcet (SENSIPAR) 60 MG tablet Take 60 mg by mouth daily.  02/16/11  Yes Dianne Dun, MD  cloNIDine (CATAPRES) 0.1 MG tablet Take 0.1 mg by mouth 2 (two) times daily.   Yes Historical Provider, MD  fluconazole (DIFLUCAN) 100 MG tablet Take 1 tablet (  100 mg total) by mouth daily. 03/12/15  Yes Altamese Dilling, MD  furosemide (LASIX) 80 MG tablet Take 80 mg by mouth 2 (two) times daily.   Yes Historical Provider, MD  HYDROcodone-acetaminophen (NORCO/VICODIN) 5-325 MG per tablet Take 1 tablet by mouth every 6 (six) hours as needed for moderate pain. 03/12/15  Yes Altamese Dilling, MD  insulin aspart (NOVOLOG) 100 UNIT/ML injection Inject 4 Units into the skin 3 (three) times daily with meals. 02/11/15  Yes Nishant Dhungel, MD  insulin aspart (NOVOLOG) 100 UNIT/ML injection Inject 0-7 Units into the skin 4 (four) times daily -  before meals and at bedtime. Patient taking differently: Inject 0-7 Units into the skin See admin instructions. Pt uses three times a day before meals and at bedtime as needed per sliding scale. 03/12/15  Yes Altamese Dilling, MD  insulin detemir (LEVEMIR) 100 UNIT/ML injection Inject 0.14 mLs (14 Units total) into the skin at bedtime. 03/12/15  Yes Altamese Dilling, MD  isosorbide mononitrate (IMDUR) 30 MG 24 hr tablet Take 30 mg by mouth 2 (two) times daily.   Yes Historical Provider, MD  levothyroxine (SYNTHROID, LEVOTHROID) 75 MCG tablet Take 1 tablet (75 mcg total) by mouth daily before breakfast. 07/26/13  Yes Dianne Dun, MD  lisinopril (PRINIVIL,ZESTRIL) 10 MG tablet Take 10 mg by mouth daily.   Yes Historical Provider, MD  Omega-3 Fatty Acids (FISH OIL) 1000 MG CAPS Take 1 capsule by mouth daily.   Yes Historical Provider, MD  ondansetron (ZOFRAN ODT) 4 MG disintegrating tablet Take 1  tablet (4 mg total) by mouth every 8 (eight) hours as needed for nausea or vomiting. 03/12/15  Yes Altamese Dilling, MD  pantoprazole (PROTONIX) 40 MG tablet Take 40 mg by mouth 2 (two) times daily.   Yes Historical Provider, MD  PARoxetine (PAXIL) 20 MG tablet Take 20 mg by mouth daily.    Yes Historical Provider, MD  promethazine (PHENERGAN) 25 MG tablet Take 1 tablet (25 mg total) by mouth every 6 (six) hours as needed for nausea or vomiting. 03/12/15  Yes Altamese Dilling, MD  rosuvastatin (CRESTOR) 20 MG tablet Take 1 tablet (20 mg total) by mouth at bedtime. 12/13/12  Yes Dianne Dun, MD  Nutritional Supplements (FEEDING SUPPLEMENT, NEPRO CARB STEADY,) LIQD Take 237 mLs by mouth as needed (missed meal during dialysis.). Patient not taking: Reported on 03/13/2015 08/24/14   Richarda Overlie, MD    Current Facility-Administered Medications  Medication Dose Route Frequency Provider Last Rate Last Dose  . 0.9 %  sodium chloride infusion  100 mL Intravenous PRN Sarath Kolluru, MD      . 0.9 %  sodium chloride infusion  100 mL Intravenous PRN Lamont Dowdy, MD      . ampicillin-sulbactam (UNASYN) 1.5 g in sodium chloride 0.9 % 50 mL IVPB  1.5 g Intravenous Q12H Clydie Braun, MD      . cefTRIAXone (ROCEPHIN) 1 g in dextrose 5 % 50 mL IVPB - Premix  1 g Intravenous Q24H Ramonita Lab, MD   1 g at 03/14/15 0137  . epoetin alfa (EPOGEN,PROCRIT) injection 10,000 Units  10,000 Units Intravenous Once Lamont Dowdy, MD      . fluconazole (DIFLUCAN) IVPB 100 mg  100 mg Intravenous Q24H Ramonita Lab, MD   100 mg at 03/14/15 0212  . hydrALAZINE (APRESOLINE) injection 5 mg  5 mg Intravenous Q6H PRN Ramonita Lab, MD   5 mg at 03/14/15 1118  . insulin aspart (novoLOG) injection 0-15 Units  0-15  Units Subcutaneous TID WC Adrian Saran, MD   5 Units at 03/14/15 1249  . [START ON 03/15/2015] insulin detemir (LEVEMIR) injection 14 Units  14 Units Subcutaneous Daily Sital Mody, MD      . LORazepam (ATIVAN) injection 1  mg  1 mg Intravenous TID PRN Ramonita Lab, MD   1 mg at 03/14/15 1249  . pantoprazole (PROTONIX) 80 mg in sodium chloride 0.9 % 250 mL (0.32 mg/mL) infusion  8 mg/hr Intravenous Continuous Arelia Longest, MD 25 mL/hr at 03/14/15 7262 8 mg/hr at 03/14/15 0634    Allergies as of 03/13/2015 - Review Complete 03/13/2015  Allergen Reaction Noted  . Amoxicillin-pot clavulanate Nausea And Vomiting 05/21/2011  . Hydromorphone hcl Other (See Comments) 05/21/2011  . Rifampin Nausea And Vomiting 05/21/2011     Review of Systems:    A 12 system review was obtained and positive for weight gain, fatigue, weakness, otherwise negative except where noted in HPI.    Physical Exam:  Vital signs in last 24 hours: Temp:  [97.5 F (36.4 C)-98.2 F (36.8 C)] 97.8 F (36.6 C) (05/05 1334) Pulse Rate:  [79-85] 85 (05/05 1430) Resp:  [18-20] 18 (05/05 1430) BP: (159-182)/(72-88) 168/84 mmHg (05/05 1430) SpO2:  [95 %-100 %] 100 % (05/05 1400) Weight:  [83.915 kg (185 lb)-98.93 kg (218 lb 1.6 oz)] 98.93 kg (218 lb 1.6 oz) (05/05 0500)    General:  Well-developed, chronically ill looking Caucasian male. He has generalized edema.  Head:  Head without obvious abnormality, atraumatic  Eyes:   Conjunctiva pink, sclera anicteric   ENT:   Mouth free of lesions, mucosa moist, tongue pink, no thrush noted, teeth and gums normal Neck:   Supple w/o thyromegaly or mass, trachea midline, no adenopathy  Lungs: Clear to auscultation bilaterally, respirations unlabored Heart:     Normal S1S2, no rubs, murmurs, gallops. Abdomen: Soft, non-tender, positive ascites, no hepatosplenomegaly, BS normal Rectal: Deferred Extremities:   Positive edema, cyanosis, or clubbing, missing toes Skin  Skin color sallow Neuro:  A&O x 3. CNII-XII intact, normal strength Psych:  Appropriate mood and affect.  Data Reviewed:  LAB RESULTS:  Recent Labs  03/14/15 0202 03/14/15 0848  WBC 5.4 5.4  HGB 9.9* 10.0*  HCT 31.4* 32.0*   PLT 101* 98*   BMET  Recent Labs  03/14/15 0202 03/14/15 0844  NA 133* 133*  K 4.6 4.9  CL 94* 93*  CO2 29 28  GLUCOSE 176* 224*  BUN 30* 32*  CREATININE 3.47* 3.68*  CALCIUM 8.4* 8.5*   LFT  Recent Labs  03/14/15 0844  ALBUMIN 2.3*   PT/INR  Recent Labs  03/14/15 0202  LABPROT 15.7*  INR 1.23    STUDIES: Dg Chest Port 1 View  03/13/2015   CLINICAL DATA:  Coronary artery disease.  MI.  Nausea, chest pain  EXAM: PORTABLE CHEST - 1 VIEW  COMPARISON:  03/03/2015  FINDINGS: There is cardiomegaly. Prior CABG. Diffuse interstitial prominence and perihilar/lower lobe opacities, most compatible with edema/ CHF. Suspect small effusions. No acute bony abnormality.  IMPRESSION: Mild CHF.   Electronically Signed   By: Charlett Nose M.D.   On: 03/13/2015 17:01     Assessment:  Taylor [name] is a 44 y.o. reporting nausea, vomiting yesterday and a few episodes last week described as coffee-ground emesis. He denies bright red blood. He denies melena. He does report some diarrhea. He's been on antibiotic therapy for scrotal infection. He was C. difficile negative last week. He  has had chronic heartburn and presents on omeprazole which he reports he takes faithfully. He denies NSAID use. He has end stage renal disease and on hemodialysis which places him at risk for gastritis and gastric ulcer. He has had multiple medical problems as well as hospitalizations. This puts him at risk for stress ulcer. He denies prior history of EGD or colonoscopy. He denies prior history of ulcers. He does have ascites which raises the question of possible liver disease and  esophageal varices.  Plan:  Recommend clear liquid diet to advance to full liquids as tolerated. Perform upper endoscopy tomorrow. He is on a Protonix drip. T Obtain stool for culture. He was C. difficile negative from last week. Obtain abdominal ultrasound tomorrow to evaluate liver. This will need to need to be timed according to  when he is on call for endoscopy.  This case was discussed with Dr. [name] in collaboration of care. Thank you for the consultation.  These services provided by Amedeo Kinsman RN, MSN, ANP-BC under collaborative practice agreement with [name], MD.  03/14/2015, 2:48 PM

## 2015-03-14 NOTE — H&P (Signed)
[facility]   Reason for Consult:Scrotal absces    Referring Physician: Mody  Principal Problem:   Hematemesis    HPI: Taylor [name] is a 44 y.o. male with ESRD and recent scrotal absces sp surgical drainage 4/13 by Dr Erlene Quan. Has been in and out of hospital. Was on augmentin and keflex at dc.  Has not seen urology. Drain still in place. Site improving. Admitted with Hematemesis   Past Medical History  Diagnosis Date  . End stage renal disease on dialysis     LUE fistula  . Type I diabetes mellitus     a. 03/2014 admitted with HNK to [facility].  . Diabetic neuropathy     severe, s/p multiple toe amputation  . Hypothyroidism   . Hypertension   . Hyperlipidemia   . H/O hiatal hernia   . GERD (gastroesophageal reflux disease)   . Anxiety   . Sebaceous cyst     side of neck  . Pneumonia     2010  . Anemia     a. req PRBC's 2011.  [name] PAD (peripheral artery disease)     a. s/p amputation of toes on the right;  b. left LE claudication.  . Coronary artery disease     a. s/p MI;  b. 10/2009 CABG x 3 @ [facility]: LIMA->LAD, VG->OM3, VG->RPDA; c. 11/2010 Cath 3/3 patent grafts;  d 12/2012 Cath: LM 30d, LAD 85p, D1 70, D2 90, LCX 40ost, OM2 100, RCA 90p, 134m, L->LAD ok, VG->OM3 ok, VG->RPDA 30, EF 50%->Med Rx.  . Cataract     right  . Valvular disease     a. 11/2012 Echo: EF 55-60%, mild LVH, mild MR, mild bi-atrial enlargement, mild-mod TR, PASP 5mmHg.  . CHF (congestive heart failure)   . Depression   . Arthritis     rheumatoid arthritis   . Hx of transfusion of packed red blood cells   . Myocardial infarction 2010  . Anginal pain     Allergies:  Allergies  Allergen Reactions  . Amoxicillin-Pot Clavulanate Nausea And Vomiting  . Hydromorphone Hcl Other (See Comments)    Reaction:  Unknown  . Rifampin Nausea And Vomiting    Current antibiotics: Antibiotics Given (last 72 hours)    Date/Time Action Medication Dose Rate   03/14/15 0137 Given    cefTRIAXone (ROCEPHIN) 1 g in dextrose 5 % 50 mL IVPB - Premix 1 g 100 mL/hr      Antibiotics Given (last 72 hours)    Date/Time Action Medication Dose Rate   03/14/15 0137 Given   cefTRIAXone (ROCEPHIN) 1 g in dextrose 5 % 50 mL IVPB - Premix 1 g 100 mL/hr       MEDICATIONS: . ampicillin-sulbactam (UNASYN) IV  1.5 g Intravenous Q12H  . cefTRIAXone (ROCEPHIN)  IV  1 g Intravenous Q24H  . epoetin (EPOGEN/PROCRIT) injection  10,000 Units Intravenous Once  . fluconazole (DIFLUCAN) IV  100 mg Intravenous Q24H  . insulin aspart  0-15 Units Subcutaneous TID WC  . insulin detemir  14 Units Subcutaneous QHS    History  Substance Use Topics  . Smoking status: Never Smoker   . Smokeless tobacco: Former Systems developer    Types: Chew    Quit date: 08/23/1995  . Alcohol Use: 0.0 oz/week     Comment: occasional beer    Family History  Problem Relation Age of Onset  . Heart disease Other   . Hypertension Other   . Hypertension Mother   .  Heart disease Mother   . Diabetes Mother   . Birth defects Paternal Uncle     unaware  . Birth defects Paternal Grandmother     breast    Review of Systems - 11 systems reviewed and negative per HPI   OBJECTIVE: Temp:  [97.5 F (36.4 C)-98.2 F (36.8 C)] 97.8 F (36.6 C) (05/05 1112) Pulse Rate:  [79-84] 84 (05/05 1112) Resp:  [18-20] 18 (05/05 0504) BP: (159-182)/(72-88) 166/72 mmHg (05/05 1200) SpO2:  [95 %-100 %] 100 % (05/05 1112) Weight:  [83.915 kg (185 lb)-98.93 kg (218 lb 1.6 oz)] 98.93 kg (218 lb 1.6 oz) (05/05 0500) Physical Exam  Constitutional: He is oriented to person, place, and time. He appears chronically ill, thin Mouth/Throat: Oropharynx is clear and moist. No oropharyngeal exudate.  Cardiovascular: Normal rate, regular rhythm and normal heart sounds Pulmonary/Chest: Effort normal and breath sounds normal.  Abdominal: Soft. Bowel sounds are normal. He exhibits no distension. There is no tenderness. SP cath in  place Lymphadenopathy:  He has no cervical adenopathy.  Neurological: He is alert and oriented to person, place, and time.  Skin: Skin is warm and dry. No rash noted. No erythema.  Psychiatric: He has a normal mood and affect. His behavior is normal.  GU - some srotal edema and penile edema, has drain in place. Min purulence   LABS: Results for orders placed or performed during the hospital encounter of 03/13/15 (from the past 48 hour(s))  Basic metabolic panel     Status: Abnormal   Collection Time: 03/13/15  3:44 PM  Result Value Ref Range   Sodium 136 135 - 145 mmol/L   Potassium 4.7 3.5 - 5.1 mmol/L   Chloride 96 (L) 101 - 111 mmol/L   CO2 31 22 - 32 mmol/L   Glucose, Bld 197 (H) 65 - 99 mg/dL   BUN 29 (H) 6 - 20 mg/dL   Creatinine, Ser 7.49 (H) 0.61 - 1.24 mg/dL   Calcium 8.8 (L) 8.9 - 10.3 mg/dL   GFR calc non Af Amer 21 (L) >60 mL/min   GFR calc Af Amer 24 (L) >60 mL/min    Comment: (NOTE) The eGFR has been calculated using the CKD EPI equation. This calculation has not been validated in all clinical situations. eGFR's persistently <90 mL/min signify possible Chronic Kidney Disease.    Anion gap 9 5 - 15  CBC     Status: Abnormal   Collection Time: 03/13/15  3:44 PM  Result Value Ref Range   WBC 5.3 3.8 - 10.6 K/uL   RBC 3.51 (L) 4.40 - 5.90 MIL/uL   Hemoglobin 10.2 (L) 13.0 - 18.0 g/dL   HCT 21.1 (L) 02.8 - 39.1 %   MCV 92.1 80.0 - 100.0 fL   MCH 29.2 26.0 - 34.0 pg   MCHC 31.6 (L) 32.0 - 36.0 g/dL   RDW 41.5 (H) 69.5 - 24.7 %   Platelets 105 (L) 150 - 440 K/uL  Troponin I     Status: None   Collection Time: 03/13/15  3:44 PM  Result Value Ref Range   Troponin I 0.03 <0.031 ng/mL    Comment:        NO INDICATION OF MYOCARDIAL INJURY.   Type and screen     Status: None   Collection Time: 03/13/15  4:51 PM  Result Value Ref Range   ABO/RH(D) O POS    Antibody Screen NEG    Sample Expiration 03/16/2015   Glucose, capillary  Status: Abnormal    Collection Time: 03/13/15 10:43 PM  Result Value Ref Range   Glucose-Capillary 173 (H) 70 - 99 mg/dL  Hemoglobin     Status: Abnormal   Collection Time: 03/14/15  1:52 AM  Result Value Ref Range   Hemoglobin 9.8 (L) 13.0 - 18.0 g/dL  Hematocrit     Status: Abnormal   Collection Time: 03/14/15  1:52 AM  Result Value Ref Range   HCT 31.2 (L) 40.0 - 52.0 %  CBC     Status: Abnormal   Collection Time: 03/14/15  2:02 AM  Result Value Ref Range   WBC 5.4 3.8 - 10.6 K/uL   RBC 3.40 (L) 4.40 - 5.90 MIL/uL   Hemoglobin 9.9 (L) 13.0 - 18.0 g/dL   HCT 77.1 (L) 54.2 - 98.5 %   MCV 92.4 80.0 - 100.0 fL   MCH 29.0 26.0 - 34.0 pg   MCHC 31.4 (L) 32.0 - 36.0 g/dL   RDW 90.3 (H) 41.8 - 88.5 %   Platelets 101 (L) 150 - 440 K/uL  Protime-INR     Status: Abnormal   Collection Time: 03/14/15  2:02 AM  Result Value Ref Range   Prothrombin Time 15.7 (H) 11.4 - 15.0 seconds   INR 1.23   Basic metabolic panel     Status: Abnormal   Collection Time: 03/14/15  2:02 AM  Result Value Ref Range   Sodium 133 (L) 135 - 145 mmol/L   Potassium 4.6 3.5 - 5.1 mmol/L   Chloride 94 (L) 101 - 111 mmol/L   CO2 29 22 - 32 mmol/L   Glucose, Bld 176 (H) 65 - 99 mg/dL   BUN 30 (H) 6 - 20 mg/dL   Creatinine, Ser 4.18 (H) 0.61 - 1.24 mg/dL   Calcium 8.4 (L) 8.9 - 10.3 mg/dL   GFR calc non Af Amer 20 (L) >60 mL/min   GFR calc Af Amer 23 (L) >60 mL/min    Comment: (NOTE) The eGFR has been calculated using the CKD EPI equation. This calculation has not been validated in all clinical situations. eGFR's persistently <90 mL/min signify possible Chronic Kidney Disease.    Anion gap 10 5 - 15  Glucose, capillary     Status: Abnormal   Collection Time: 03/14/15  7:43 AM  Result Value Ref Range   Glucose-Capillary 221 (H) 70 - 99 mg/dL  Renal function panel     Status: Abnormal   Collection Time: 03/14/15  8:44 AM  Result Value Ref Range   Sodium 133 (L) 135 - 145 mmol/L   Potassium 4.9 3.5 - 5.1 mmol/L   Chloride  93 (L) 101 - 111 mmol/L   CO2 28 22 - 32 mmol/L   Glucose, Bld 224 (H) 65 - 99 mg/dL   BUN 32 (H) 6 - 20 mg/dL   Creatinine, Ser 2.58 (H) 0.61 - 1.24 mg/dL   Calcium 8.5 (L) 8.9 - 10.3 mg/dL   Phosphorus 3.4 2.5 - 4.6 mg/dL   Albumin 2.3 (L) 3.5 - 5.0 g/dL   GFR calc non Af Amer 18 (L) >60 mL/min   GFR calc Af Amer 21 (L) >60 mL/min    Comment: (NOTE) The eGFR has been calculated using the CKD EPI equation. This calculation has not been validated in all clinical situations. eGFR's persistently <90 mL/min signify possible Chronic Kidney Disease.    Anion gap 12 5 - 15  CBC     Status: Abnormal   Collection Time: 03/14/15  8:48 AM  Result Value Ref Range   WBC 5.4 3.8 - 10.6 K/uL   RBC 3.46 (L) 4.40 - 5.90 MIL/uL   Hemoglobin 10.0 (L) 13.0 - 18.0 g/dL   HCT 32.0 (L) 40.0 - 52.0 %   MCV 92.4 80.0 - 100.0 fL   MCH 28.9 26.0 - 34.0 pg   MCHC 31.3 (L) 32.0 - 36.0 g/dL   RDW 17.5 (H) 11.5 - 14.5 %   Platelets 98 (L) 150 - 440 K/uL  Glucose, capillary     Status: Abnormal   Collection Time: 03/14/15 11:42 AM  Result Value Ref Range   Glucose-Capillary 221 (H) 70 - 99 mg/dL   No components found for: ESR, C REACTIVE PROTEIN MICRO: Recent Results (from the past 720 hour(s))  Culture, blood (single)     Status: None   Collection Time: 02/20/15  7:45 AM  Result Value Ref Range Status   Micro Text Report   Final       COMMENT                   NO GROWTH AEROBICALLY/ANAEROBICALLY IN 5 DAYS   ANTIBIOTIC                                                      Culture, blood (single)     Status: None   Collection Time: 02/20/15  7:50 AM  Result Value Ref Range Status   Micro Text Report   Final       COMMENT                   NO GROWTH AEROBICALLY/ANAEROBICALLY IN 5 DAYS   ANTIBIOTIC                                                      Wound culture     Status: None   Collection Time: 02/20/15  6:35 PM  Result Value Ref Range Status   Micro Text Report   Final       SOURCE:  SCROTUM    ORGANISM 1                LIGHT GROWTH Escherichia coli   ORGANISM 2                HEAVY GROWTH Enterococcus faecalis   COMMENT                   MULTIPLE ANAEROBIC ORGANISMS ISOLATED   COMMENT                   -   COMMENT                   WITH NORMAL SKIN FLORA   GRAM STAIN                MANY WHITE BLOOD CELLS   GRAM STAIN                FEW INTRACELLULAR GRAM NEGATIVE RODS   GRAM STAIN                FEW GRAM POSITIVE ROD  ANTIBIOTIC                    ORG#1    ORG#2     AMPICILLIN                    R        S         CEFAZOLIN                     S                  CEFOXITIN                     I                  CEFTAZIDIME                   S                  CEFTRIAXONE                   S                  CIPROFLOXACIN                 R                  ERTAPENEM                     S                  GENTAMICIN                    S                  IMIPENEM                      S                  LEVOFLOXACIN                  R                   Trimethoprim/Sulfamethoxazole R                  LINEZOLID                              S           Clostridium Difficile [facility])     Status: None   Collection Time: 02/24/15 12:29 PM  Result Value Ref Range Status   Micro Text Report   Final       C.DIFFICILE ANTIGEN       C.DIFFICILE GDH ANTIGEN : NEGATIVE   C.DIFFICILE TOXIN A/B     C.DIFFICILE TOXINS A AND B : NEGATIVE   INTERPRETATION            Negative for C. difficile.    ANTIBIOTIC  Urine culture     Status: None   Collection Time: 03/03/15  3:56 PM  Result Value Ref Range Status   Micro Text Report   Final       SOURCE: CLEAN CATCH    ORGANISM 1                20,000 CFU/ML Candida albicans   COMMENT                   -   ANTIBIOTIC                    ORG#1                                               Culture, blood (single)     Status: None   Collection Time: 03/03/15  3:56 PM  Result  Value Ref Range Status   Micro Text Report   Final       COMMENT                   NO GROWTH AEROBICALLY/ANAEROBICALLY IN 5 DAYS   ANTIBIOTIC                                                      Culture, blood (single)     Status: None   Collection Time: 03/03/15  6:15 PM  Result Value Ref Range Status   Micro Text Report   Final       COMMENT                   NO GROWTH AEROBICALLY/ANAEROBICALLY IN 5 DAYS   ANTIBIOTIC                                                       IMAGING: Dg Chest Port 1 View  03/13/2015   CLINICAL DATA:  Coronary artery disease.  MI.  Nausea, chest pain  EXAM: PORTABLE CHEST - 1 VIEW  COMPARISON:  03/03/2015  FINDINGS: There is cardiomegaly. Prior CABG. Diffuse interstitial prominence and perihilar/lower lobe opacities, most compatible with edema/ CHF. Suspect small effusions. No acute bony abnormality.  IMPRESSION: Mild CHF.   Electronically Signed   By: Rolm Baptise M.D.   On: 03/13/2015 17:01    HISTORICAL MICRO/IMAGING  Assessment/Plan:   44 year old with poorly controlled brittle diabetes, end-stage renal disease with several recent admissions since having a scrotal abscess drained in April by Dr Erlene Quan. Has been on augmentin and keflex and the infection seems to be improving but has not had urology fu due to repeated admissions  He had had multiple interventions at [facility].  Cultures have grown methicillin sensitive Staphylococcus aureus, Escherichia coli, Enterococcus sensitive to ampicillin, coagulase- negative staph and Candida in the past, as well as gram-positive broad.   He is now status post I and D and placement of Penrose drains by Dr. Erlene Quan April 13.    RECOMMENDATIONS:   It is now 3 weeks since surgery Would consult Dr Erlene Quan  to fu on drains (ordered consult) as has not had otpt urology fu Since currently NPO due to hematemesis can change  Augmentin 500 daily to unasyn 1.5 q 12 but change back to augmentin once can take po  This will  cover the Enterococcus, the Staphylococcus aureus that was isolated in the past as well as any anaerobes that could be potentially involved.   This will  not cover the Escherichia coli which was resistant to ampicillin so he was on  Keflex, renal dosing.  Would cont ctx for now but once taking po change back to augmentin and keflex He has also grown Candida in the past and I would suggest adding fluconazole for short course as well.

## 2015-03-14 NOTE — Progress Notes (Signed)
  Inpatient Diabetes Program Recommendations  AACE/ADA: New Consensus Statement on Inpatient Glycemic Control (2013)  Target Ranges:  Prepandial:   less than 140 mg/dL      Peak postprandial:   less than 180 mg/dL (1-2 hours)      Critically ill patients:  140 - 180 mg/dL    Results for Taylor [name], Taylor [name] (MRN [id number]) as of 03/14/2015 09:54  Ref. Range 03/13/2015 22:43 03/14/2015 07:43  Glucose-Capillary Latest Ref Range: 70-99 mg/dL 103 (H) 159 (H)    Diabetes history: Type 1 diabetes  Outpatient Diabetes medications: Lantus 14 units daily, Novolog 4 units tid with meals, plus Novolog correction below:  insulin aspart 100 UNIT/ML injection  Commonly known as: novoLOG  Inject 0-7 Units into the skin 4 (four) times daily - before meals and at bedtime.  Per sliding scale- up to 150- 0 units, 151-200- 2 units, 201-250- 3 units, 251-300- 4 units, 301-350- 6 units, 351-400- 7 units, call MD if >400.          Current orders for Inpatient glycemic control: Novolog insulin sensitive correction 0-9 units tid with meals  Patient has Type 1 diabetes- needs basal insulin. Spoke with Dr. Juliene Pina regarding needs.  The insulin medications the patient was prescribed as an outpatient were decided upon while he was an inpatient 2 days ago and was established after numerous changes. He had very controlled blood sugars when he was discharged on 03/12/15 (see chart review).  Susette Racer, RN, BA, MHA, CDE Diabetes Coordinator Inpatient Diabetes Program  [phone] (Team Pager) [phone] [facility]) 03/14/2015 10:11 AM

## 2015-03-14 NOTE — Progress Notes (Signed)
PRE HD   

## 2015-03-14 NOTE — Progress Notes (Signed)
HD START 

## 2015-03-15 ENCOUNTER — Inpatient Hospital Stay: Payer: Medicare Other

## 2015-03-15 ENCOUNTER — Encounter
Admission: EM | Disposition: A | Discharge status: Skilled Nursing Facility | Payer: Self-pay | Source: Home / Self Care | Attending: Internal Medicine

## 2015-03-15 ENCOUNTER — Encounter: Payer: Self-pay | Admitting: Anesthesiology

## 2015-03-15 LAB — CBC
HEMATOCRIT: 31.5 % — AB (ref 40.0–52.0)
Hemoglobin: 9.9 g/dL — ABNORMAL LOW (ref 13.0–18.0)
MCH: 28.9 pg (ref 26.0–34.0)
MCHC: 31.4 g/dL — ABNORMAL LOW (ref 32.0–36.0)
MCV: 92 fL (ref 80.0–100.0)
Platelets: 123 10*3/uL — ABNORMAL LOW (ref 150–440)
RBC: 3.43 MIL/uL — AB (ref 4.40–5.90)
RDW: 17.3 % — ABNORMAL HIGH (ref 11.5–14.5)
WBC: 5.4 10*3/uL (ref 3.8–10.6)

## 2015-03-15 LAB — GLUCOSE, CAPILLARY
Glucose-Capillary: 80 mg/dL (ref 70–99)
Glucose-Capillary: 138 mg/dL — ABNORMAL HIGH (ref 70–99)
Glucose-Capillary: 270 mg/dL — ABNORMAL HIGH (ref 70–99)
Glucose-Capillary: 449 mg/dL — ABNORMAL HIGH (ref 70–99)
Glucose-Capillary: 451 mg/dL — ABNORMAL HIGH (ref 70–99)

## 2015-03-15 SURGERY — EGD (ESOPHAGOGASTRODUODENOSCOPY)
Anesthesia: Monitor Anesthesia Care

## 2015-03-15 MED ORDER — SODIUM CHLORIDE 0.9 % IV SOLN: 100.0000 mL | INTRAVENOUS | Status: DC | PRN

## 2015-03-15 MED ORDER — EPOETIN ALFA 10000 UNIT/ML IJ SOLN
10000.0000 [IU] | Freq: Once | INTRAMUSCULAR | Status: AC
  Administered 2015-03-16: 10000 [IU] via INTRAVENOUS

## 2015-03-15 MED ORDER — ONDANSETRON HCL 4 MG/2ML IJ SOLN
4.0000 mg | Freq: Four times a day (QID) | INTRAMUSCULAR | Status: DC | PRN
  Administered 2015-03-16 – 2015-03-17 (×2): 4 mg via INTRAVENOUS
  Filled 2015-03-15 (×2): qty 2

## 2015-03-15 MED ORDER — LOPERAMIDE HCL 2 MG PO CAPS
ORAL_CAPSULE | ORAL | Status: AC
  Administered 2015-03-15: 2 mg
  Filled 2015-03-15: qty 1

## 2015-03-15 MED ORDER — DIPHENOXYLATE-ATROPINE 2.5-0.025 MG PO TABS
1.0000 | ORAL_TABLET | Freq: Four times a day (QID) | ORAL | Status: DC | PRN
  Administered 2015-03-16 – 2015-03-17 (×2): 1 via ORAL
  Filled 2015-03-15 (×2): qty 1

## 2015-03-15 MED ORDER — INSULIN DETEMIR 100 UNIT/ML ~~LOC~~ SOLN
7.0000 [IU] | Freq: Every day | SUBCUTANEOUS | Status: DC
  Administered 2015-03-15 – 2015-03-19 (×4): 7 [IU] via SUBCUTANEOUS
  Filled 2015-03-15 (×6): qty 0.07

## 2015-03-15 NOTE — Progress Notes (Signed)
Notified Dr Clint Guy of BS 449, 451. Pt on Dextrose 10%@50ml /hr with no insulin coverage. I stopped the dextrose. MD verbalized to d/c Dextrose order. MD entered an order for 7units of levemir.

## 2015-03-15 NOTE — Consult Note (Signed)
Pt was due to have EGD and ate some egg and this was cancelled.  His hgb is 9.9, plt 125, WBC 5.4 albumin 2.3 Chest clear, abd full, somewhat tight.  VSS afebrile.  I discussed with Dr. Juliene Pina and will schedule him for an EGD from [facility] for Monday at 7:30am.

## 2015-03-15 NOTE — Discharge Summary (Addendum)
[facility] - [place] at [facility]   PATIENT NAME: Taylor [name]    MR#:  [id number]  DATE OF BIRTH:  [birth year]  DATE OF ADMISSION:  03/13/2015 ADMITTING PHYSICIAN: Ramonita Lab, MD  DATE OF DISCHARGE: No discharge date for patient encounter.  PRIMARY CARE PHYSICIAN: [name], MD    ADMISSION DIAGNOSIS:  Coffee ground emesis [K92.0]  DISCHARGE DIAGNOSIS:  Principal Problem:   Hematemesis   SECONDARY DIAGNOSIS:   Past Medical History  Diagnosis Date  . End stage renal disease on dialysis     LUE fistula  . Type I diabetes mellitus     a. 03/2014 admitted with HNK to [facility].  . Diabetic neuropathy     severe, s/p multiple toe amputation  . Hypothyroidism   . Hypertension   . Hyperlipidemia   . H/O hiatal hernia   . GERD (gastroesophageal reflux disease)   . Anxiety   . Sebaceous cyst     side of neck  . Pneumonia     2010  . Anemia     a. req PRBC's 2011.  [name] PAD (peripheral artery disease)     a. s/p amputation of toes on the right;  b. left LE claudication.  . Coronary artery disease     a. s/p MI;  b. 10/2009 CABG x 3 @ [facility]: LIMA->LAD, VG->OM3, VG->RPDA; c. 11/2010 Cath 3/3 patent grafts;  d 12/2012 Cath: LM 30d, LAD 85p, D1 70, D2 90, LCX 40ost, OM2 100, RCA 90p, 178m, L->LAD ok, VG->OM3 ok, VG->RPDA 30, EF 50%->Med Rx.  . Cataract     right  . Valvular disease     a. 11/2012 Echo: EF 55-60%, mild LVH, mild MR, mild bi-atrial enlargement, mild-mod TR, PASP .  . CHF (congestive heart failure)   . Depression   . Arthritis     rheumatoid arthritis   . Hx of transfusion of packed red blood cells   . Myocardial infarction 2010  . Anginal pain     HOSPITAL COURSE:  44 y/o  Male with hx of diabetes and recent scrotal abscess presented with hematemesis.   1.Hematemesis: Hemoglobin is stable and has been stable since admission. He did not have any episodes of hematemesis since admission. Since he could not have an EGD on 5/6  as he ate breakfast and he was medically stable without issues with his hemoglobin or symptoms, it was decided that he could have this procedure performed as an outpatient. However, he appealed the discharge.[name] He will continue on protonix.  2. History of scrotal abscess: Patient was recently discharged on Keflex and Augmentin as well as Diflucan. He's currently on IV Rocephin, UNASYN and IV give Diflucan. Infectious disease consultation appreciated. I also spoke with UROLOGY. Patient ws seen on Monday by Dr. Apolinar Junes and she says he needs to follow up Dr. Franchot Erichsen who performed original penile implant. Patient will have an appointment made prior to discharge.  3. History of end-stage renal disease: Nephrology was consulted for hemodialysis.He underwent dialysis while in the hospital..  4. Type 1 diabetes: Patient is on Levemir 14 units at home. I appreciate diabetes consultation. He is on sliding scale insulin. His blood sugars are stable.  5. Hypertension essential: He will continue outpatient medications.   6. Hypothyroidism He is on Synthroid.    DISCHARGE CONDITIONS AND DIET:  Stable ADA diet  CONSULTS OBTAINED:  Treatment Team:  [name], MD Clydie Braun, MD Lamont Dowdy, MD  DRUG ALLERGIES:  Allergies  Allergen Reactions  . Amoxicillin-Pot Clavulanate Nausea And Vomiting  . Hydromorphone Hcl Other (See Comments)    Reaction:  Unknown  . Rifampin Nausea And Vomiting    DISCHARGE MEDICATIONS:   Current Discharge Medication List    CONTINUE these medications which have NOT CHANGED   Details  acetaminophen (TYLENOL) 500 MG tablet Take 1,000 mg by mouth every 6 (six) hours as needed for mild pain.     ALPRAZolam (XANAX) 0.25 MG tablet Take 0.25 mg by mouth every 6 (six) hours as needed for anxiety.    alum & mag hydroxide-simeth (MAALOX PLUS) 400-400-40 MG/5ML suspension Take 30 mLs by mouth every 4 (four) hours as needed for indigestion.      amitriptyline (ELAVIL) 25 MG tablet Take 1 tablet (25 mg total) by mouth at bedtime. Qty: 30 tablet, Refills: 1    amoxicillin-clavulanate (AUGMENTIN) 500-125 MG per tablet Take 1 tablet (500 mg total) by mouth at bedtime. Qty: 14 tablet, Refills: 0    aspirin 81 MG EC tablet Take 81 mg by mouth daily. Swallow whole.    calcium carbonate (TUMS - DOSED IN MG ELEMENTAL CALCIUM) 500 MG chewable tablet Chew 1-2 tablets (200-400 mg of elemental calcium total) by mouth 2 (two) times daily as needed for indigestion or heartburn. Qty: 1 tablet, Refills: 0    carvedilol (COREG) 25 MG tablet Take 1 tablet (25 mg total) by mouth 2 (two) times daily with a meal. Qty: 90 tablet, Refills: 0    cephALEXin (KEFLEX) 250 MG capsule Take 1 capsule (250 mg total) by mouth every 12 (twelve) hours. Qty: 14 capsule, Refills: 0    cinacalcet (SENSIPAR) 60 MG tablet Take 60 mg by mouth daily.     cloNIDine (CATAPRES) 0.1 MG tablet Take 0.1 mg by mouth 2 (two) times daily.    fluconazole (DIFLUCAN) 100 MG tablet Take 1 tablet (100 mg total) by mouth daily. Qty: 7 tablet, Refills: 0    furosemide (LASIX) 80 MG tablet Take 80 mg by mouth 2 (two) times daily.    HYDROcodone-acetaminophen (NORCO/VICODIN) 5-325 MG per tablet Take 1 tablet by mouth every 6 (six) hours as needed for moderate pain. Qty: 15 tablet, Refills: 0    !! insulin aspart (NOVOLOG) 100 UNIT/ML injection Inject 4 Units into the skin 3 (three) times daily with meals. Qty: 10 mL, Refills: 0    !! insulin aspart (NOVOLOG) 100 UNIT/ML injection Inject 0-7 Units into the skin 4 (four) times daily -  before meals and at bedtime. Qty: 10 mL, Refills: 11    insulin detemir (LEVEMIR) 100 UNIT/ML injection Inject 0.14 mLs (14 Units total) into the skin at bedtime. Qty: 10 mL, Refills: 11    isosorbide mononitrate (IMDUR) 30 MG 24 hr tablet Take 30 mg by mouth 2 (two) times daily.    levothyroxine (SYNTHROID, LEVOTHROID) 75 MCG tablet Take 1  tablet (75 mcg total) by mouth daily before breakfast. Qty: 90 tablet, Refills: 1    lisinopril (PRINIVIL,ZESTRIL) 10 MG tablet Take 10 mg by mouth daily.    Omega-3 Fatty Acids (FISH OIL) 1000 MG CAPS Take 1 capsule by mouth daily.    ondansetron (ZOFRAN ODT) 4 MG disintegrating tablet Take 1 tablet (4 mg total) by mouth every 8 (eight) hours as needed for nausea or vomiting. Qty: 20 tablet, Refills: 0    pantoprazole (PROTONIX) 40 MG tablet Take 40 mg by mouth 2 (two) times daily.    PARoxetine (PAXIL) 20 MG tablet Take  20 mg by mouth daily.     promethazine (PHENERGAN) 25 MG tablet Take 1 tablet (25 mg total) by mouth every 6 (six) hours as needed for nausea or vomiting. Qty: 30 tablet, Refills: 0    rosuvastatin (CRESTOR) 20 MG tablet Take 1 tablet (20 mg total) by mouth at bedtime. Qty: 30 tablet, Refills: 5    Nutritional Supplements (FEEDING SUPPLEMENT, NEPRO CARB STEADY,) LIQD Take 237 mLs by mouth as needed (missed meal during dialysis.). Qty: 30 Can, Refills: 0     !! - Potential duplicate medications found. Please discuss with provider.            Today   CHIEF COMPLAINT:  Patient anxious. No issues overnight.   VITAL SIGNS:  Blood pressure 160/73, pulse 93, temperature 97.9 F (36.6 C), temperature source Oral, resp. rate 18, height 6' (1.829 m), weight 90.266 kg (199 lb), SpO2 100 %.   REVIEW OF SYSTEMS:  Review of Systems  Constitutional: Negative for fever and chills.  Respiratory: Negative for cough and sputum production.   Cardiovascular: Negative for chest pain and palpitations.  Gastrointestinal: Negative for heartburn, nausea, vomiting, abdominal pain, diarrhea, constipation, blood in stool and melena.  Musculoskeletal: Negative for back pain.  Neurological: Negative for tingling and headaches.     PHYSICAL EXAMINATION:  GENERAL:  44 y.o.-year-old patient lying in the bed with no acute distress.  NECK:  Supple, no jugular venous  distention. No thyroid enlargement, no tenderness.  LUNGS: Normal breath sounds bilaterally, no wheezing, rales,rhonchi  No use of accessory muscles of respiration.  CARDIOVASCULAR: S1, S2 normal. No murmurs, rubs, or gallops.  ABDOMEN: Soft, non-tender, non-distended. Bowel sounds present. No organomegaly or mass.  EXTREMITIES: No pedal edema, cyanosis, or clubbing. AV fistula patent left arm PSYCHIATRIC: The patient is alert and oriented x 3.  SKIN: No obvious rash, lesion, or ulcer.   DATA REVIEW:   CBC  Recent Labs Lab 03/15/15 0634  WBC 5.4  HGB 9.9*  HCT 31.5*  PLT 123*    Chemistries   Recent Labs Lab 03/14/15 0844  NA 133*  K 4.9  CL 93*  CO2 28  GLUCOSE 224*  BUN 32*  CREATININE 3.68*  CALCIUM 8.5*    Cardiac Enzymes  Recent Labs Lab 03/13/15 1544  TROPONINI 0.03    Microbiology Results  Results for orders placed or performed during the hospital encounter of 03/13/15  Stool culture     Status: None (Preliminary result)   Collection Time: 03/14/15  9:26 PM  Result Value Ref Range Status   Specimen Description STOOL  Final   Special Requests Normal  Final   Culture TOO YOUNG TO READ  Final   Report Status PENDING  Incomplete    RADIOLOGY:  Dg Chest 1v Repeat Same Day  03/15/2015    IMPRESSION: Right central venous line with tip in distal SVC.   Electronically Signed   By: Genevive Bi M.D.   On: 03/15/2015 01:56   Dg Chest Port 1 View   IMPRESSION: New right jugular central line. No pneumothorax. Congestive heart failure, improved.   Electronically Signed   By: Ellery Plunk M.D.   On: 03/15/2015 01:34      Management plans discussed with the patient and he is in agreement. Stable for discharge home  CODE STATUS:     Code Status Orders        Start     Ordered   03/13/15 2336  Limited resuscitation (code)  Continuous    Question Answer Comment  In the event of cardiac or respiratory ARREST: Initiate Code Blue, Call Rapid  Response No   In the event of cardiac or respiratory ARREST: Perform CPR No   In the event of cardiac or respiratory ARREST: Perform Intubation/Mechanical Ventilation No   In the event of cardiac or respiratory ARREST: Use NIPPV/BiPAp only if indicated No   In the event of cardiac or respiratory ARREST: Administer ACLS medications if indicated Yes   In the event of cardiac or respiratory ARREST: Perform Defibrillation or Cardioversion if indicated No   Comments please see MOST form      03/13/15 2335      TOTAL TIME TAKING CARE OF THIS PATIENT: 35 minutes.    Cassey Bacigalupo M.D on 03/15/2015 at 12:55 PM  Between 7am to 6pm - Pager - [phone] After 6pm go to [email or web address] - password [email or web address] [facility]  [place] [facility]  Office  [phone]  CC: Primary care physician; [name], MD

## 2015-03-15 NOTE — Progress Notes (Signed)
Diabetes history: Type 1 diabetes  Outpatient Diabetes medications: Lantus 14 units daily, Novolog 4 units tid with meals, plus Novolog correction below:  insulin aspart 100 UNIT/ML injection  Commonly known as: novoLOG  Inject 0-7 Units into the skin 4 (four) times daily - before meals and at bedtime.  Per sliding scale- up to 150- 0 units, 151-200- 2 units, 201-250- 3 units, 251-300- 4 units, 301-350- 6 units, 351-400- 7 units, call MD if >400.             Inpatient diabetes medications: Novolog Moderate correction tid, Levemir 14 units q day  Please see Novolog correction scale above that patient uses as an outpatient.  I believe the correction insulin lead to the low blood sugar last night-  Please create a custom scale for this patient. May consider decreasing the basal Levemir to 10 units q24 hours beginning today at 12:30pm.   Susette Racer, RN, BA, MHA, CDE Diabetes Coordinator Inpatient Diabetes Program  [phone] (Team Pager) [phone] [facility] Office) 03/15/2015 7:40 AM

## 2015-03-15 NOTE — Plan of Care (Signed)
Problem: Discharge Progression Outcomes Goal: Discharge plan in place and appropriate Individualization of care Has history of hypertension, diabetes, renal failure and depression controlled by medications. Lives at [facility].

## 2015-03-15 NOTE — Plan of Care (Signed)
Problem: Discharge Progression Outcomes Goal: Tolerating diet Outcome: Progressing PT was npo for procedure and made aware, but ordered a tray. EGD and EEG canceled at this time.

## 2015-03-15 NOTE — Progress Notes (Signed)
Pt medically stable for d/c today back to [facility] per MD. CSW met with pt who reports he does not feel medically stable and would like to appeal his discharge. RNCM made aware.  Pt is able to d/c to [facility] over weekend if denial overturned and pt still medically stable. CSW will follow. Wandra Feinstein, MSW, LCSW [phone] (1C coverage)

## 2015-03-15 NOTE — Plan of Care (Signed)
Problem: Discharge Progression Outcomes Goal: Other Discharge Outcomes/Goals Outcome: Progressing No active bleeding this shift. No complaints of pain. Central line placed this shift.

## 2015-03-15 NOTE — Progress Notes (Signed)
[facility] - [place] at [facility]   PATIENT NAME: Taylor [name]    MR#:  [id number]  DATE OF BIRTH:  08/04/1971  SUBJECTIVE:  Patient is hungry and anxious for EGD later today  REVIEW OF SYSTEMS:  CONSTITUTIONAL: No fever, fatigue or weakness.  RESPIRATORY: No cough, shortness of breath, wheezing or hemoptysis.  CARDIOVASCULAR: No chest pain, orthopnea, edema.  GASTROINTESTINAL: No nausea, vomiting, diarrhea or abdominal pain.  he has had hematemesis but now resolved.  GENITOURINARY: No dysuria, hematuria.  MUSCULOSKELETAL: No joint pain or arthritis.   NEUROLOGIC: No tingling, numbness, weakness.   GU: Positive scrotal abscess   DRUG ALLERGIES:   Allergies  Allergen Reactions  . Amoxicillin-Pot Clavulanate Nausea And Vomiting  . Hydromorphone Hcl Other (See Comments)    Reaction:  Unknown  . Rifampin Nausea And Vomiting    VITALS:  Blood pressure 160/73, pulse 93, temperature 97.9 F (36.6 C), temperature source Oral, resp. rate 18, height 6' (1.829 m), weight 90.266 kg (199 lb), SpO2 100 %.  PHYSICAL EXAMINATION:  GENERAL:  44 y.o.-year-old patient lying in the bed with no acute distress.  EYES: Pupils equal, round, reactive to light and accommodation. No scleral icterus. Extraocular muscles intact.  HEENT: Head atraumatic, normocephalic. Oropharynx and nasopharynx clear.  NECK:  Supple, no jugular venous distention. No thyroid enlargement, no tenderness.  LUNGS: Normal breath sounds bilaterally, no wheezing, rales,rhonchi or crepitation. No use of accessory muscles of respiration.  CARDIOVASCULAR: S1, S2 normal. No murmurs, rubs, or gallops.  ABDOMEN: Soft, nontender, nondistended. Bowel sounds present. No organomegaly or mass.  EXTREMITIENotable for amputation of his toes NEUROLOGIC: Cranial nerves II through XII are intact.  PSYCHIATRIC: The patient is alert and oriented x 3.  SKIN: No obvious rash, lesion, or ulcer.  he has numerous  tattoos and a scar on his left arm    LABORATORY PANEL:   CBC  Recent Labs Lab 03/15/15 0634  WBC 5.4  HGB 9.9*  HCT 31.5*  PLT 123*   ------------------------------------------------------------------------------------------------------------------  Chemistries   Recent Labs Lab 03/14/15 0844  NA 133*  K 4.9  CL 93*  CO2 28  GLUCOSE 224*  BUN 32*  CREATININE 3.68*  CALCIUM 8.5*   ------------------------------------------------------------------------------------------------------------------  Cardiac Enzymes  Recent Labs Lab 03/13/15 1544  TROPONINI 0.03   ------------------------------------------------------------------------------------------------------------------  RADIOLOGY:  No results found.    ASSESSMENT AND PLAN:   1.hematemesis: Hemoglobin is stable. Plan for EGD this am. He is on protonix which we will continue.  2. History of scrotal abscess: Patient was recently discharged on Keflex and Augmentin as well as Diflucan. He's currently on IV Rocephin, UNASYN and IV give Diflucan. Infectious disease consultation appreciated. I also spoke with UROLOGY. Patient ws seen on Monday by Dr. Apolinar Junes and she says he needs to follow up Dr. Franchot Erichsen who performed original penile implant. Patient will have an appointment made prior to discharge 3. History of end-stage renal disease: Nephrology has been consulted for hemodialysis.He underwent dialysis yesterday. 4. Type 1 diabetes: Patient is on Levemir 14 units at home. I appreciate diabetes consultation. He is on sliding scale insulin. His blood sugars are stable. 5. Hypertension essential: He will continue outpatient medications.   6. Hypothyroidism He is on Synthroid.    Management plans discussed with the patient and he is in agreement.  CODE STATUS: full TOTAL TIME TAKING CARE OF THIS PATIENT 35 MINUTES  POSSIBLE D/C IN ONE TO 2  DAYS, DEPENDING ON CLINICAL CONDITION.   Keyerra Lamere,  Bryanda Mikel M.D on  03/15/2015 at 10:36 AM  Between 7am to 6pm - Pager - [phone]  After 6pm go to [email or web address] - password [email or web address] [facility]  [facility]  Office  [phone]  CC: Primary care physician; [name], MD

## 2015-03-15 NOTE — Progress Notes (Signed)
Patient's EGD has been cancelled by Dr. Markham Jordan. Patient ate eggs this am and he was supposed to be NPO. Patient has not had any hematemesis since admission into our hospital. His hemoglobin has remained stable.  I spoke with Dr. Markham Jordan and in light if these findings and patient is medically stable the plan is for an outpatient EGD on Monday morning. I discussed this with the patient and he stated to me that he was ok to go back to rehab. A few minutes later I received a call from CSW that he has appealed discharge.  Other then a slightly elevated blood pressure (likley due to anxiety( there is not a medical necessity keeping patient in the hospital.   TIME SPENT 33 minutes Discussed with patient and CSW.

## 2015-03-15 NOTE — Progress Notes (Signed)
[facility]  ROUNDING NOTE   Subjective:   Hemodialysis yesterday. Tolerated treatment well. UF of  3 litres Did not get endoscopy this morning.   Objective:  Vital signs in last 24 hours:  Temp:  [97.6 F (36.4 C)-97.9 F (36.6 C)] 97.9 F (36.6 C) (05/06 1340) Pulse Rate:  [81-105] 105 (05/06 1340) Resp:  [16-18] 18 (05/06 1340) BP: (151-173)/(71-93) 173/82 mmHg (05/06 1340) SpO2:  [95 %-100 %] 95 % (05/06 1340) Weight:  [90.266 kg (199 lb)] 90.266 kg (199 lb) (05/06 0500)  Weight change: 6.35 kg (14 lb) Filed Weights   03/13/15 2300 03/14/15 0500 03/15/15 0500  Weight: 98.93 kg (218 lb 1.6 oz) 98.93 kg (218 lb 1.6 oz) 90.266 kg (199 lb)    Intake/Output: I/O last 3 completed shifts: In: 0  Out: 3002 [Other:3000; Stool:2]   Intake/Output this shift:     General: Not in acute distress Head: [place]/AT, poor dentition, moist oral mucosal membranes Neck: supple, trachea midline CVS: regular rate and rhythm, no murmurs Resp: clear to auscultation bilaterally ABD: soft, nontender EXT: 2+ bilateral lower extremity edema Neuro: nonfocal. GU: scrotal edema Access: left arm AVF   Basic Metabolic Panel:  Recent Labs Lab 03/11/15 0900 03/13/15 1544 03/14/15 0202 03/14/15 0844  NA 130* 136 133* 133*  K 5.1 4.7 4.6 4.9  CL 94* 96* 94* 93*  CO2 28 31 29 28   GLUCOSE 277* 197* 176* 224*  BUN 42* 29* 30* 32*  CREATININE 3.61* 3.31* 3.47* 3.68*  CALCIUM 8.4* 8.8* 8.4* 8.5*  PHOS 3.3  --   --  3.4    Liver Function Tests:  Recent Labs Lab 03/11/15 0900 03/14/15 0844  ALBUMIN 2.2* 2.3*   No results for input(s): LIPASE, AMYLASE in the last 168 hours. No results for input(s): AMMONIA in the last 168 hours.  CBC:  Recent Labs Lab 03/11/15 2311 03/13/15 1544 03/14/15 0152 03/14/15 0202 03/14/15 0848 03/15/15 0634  WBC 5.1 5.3  --  5.4 5.4 5.4  HGB 9.4* 10.2* 9.8* 9.9* 10.0* 9.9*  HCT 30.1* 32.3* 31.2* 31.4* 32.0* 31.5*  MCV 92.7 92.1  --   92.4 92.4 92.0  PLT 90* 105*  --  101* 98* 123*    Cardiac Enzymes:  Recent Labs Lab 03/13/15 1544  TROPONINI 0.03    BNP: Invalid input(s): POCBNP  CBG:  Recent Labs Lab 03/14/15 2032 03/14/15 2046 03/14/15 2211 03/15/15 0733 03/15/15 1121  GLUCAP 48* 47* 142* 80 138*    Microbiology: Results for orders placed or performed during the hospital encounter of 03/13/15  Stool culture     Status: None (Preliminary result)   Collection Time: 03/14/15  9:26 PM  Result Value Ref Range Status   Specimen Description STOOL  Final   Special Requests Normal  Final   Culture TOO YOUNG TO READ  Final   Report Status PENDING  Incomplete    Coagulation Studies:  Recent Labs  03/14/15 0202  LABPROT 15.7*  INR 1.23    Urinalysis: No results for input(s): COLORURINE, LABSPEC, PHURINE, GLUCOSEU, HGBUR, BILIRUBINUR, KETONESUR, PROTEINUR, UROBILINOGEN, NITRITE, LEUKOCYTESUR in the last 72 hours.  Invalid input(s): APPERANCEUR    Imaging: Dg Chest 1v Repeat Same Day  03/15/2015   CLINICAL DATA:  Central venous line placement  EXAM: CHEST - 1 VIEW SAME DAY  COMPARISON:  Radiograph 03/15/2015  FINDINGS: The right central venous line in her tract consistent tip in the distal SVC. Stable enlarged cardiac silhouette. Central venous pulmonary congestion is  unchanged. No pneumothorax.  IMPRESSION: Right central venous line with tip in distal SVC.   Electronically Signed   By: Genevive Bi M.D.   On: 03/15/2015 01:56   Dg Chest Port 1 View  03/15/2015   CLINICAL DATA:  Central line placement  EXAM: PORTABLE CHEST - 1 VIEW  COMPARISON:  03/13/2015  FINDINGS: There is a new right jugular central line with tip in the cavoatrial junction. There is no pneumothorax. There is moderate unchanged cardiomegaly. Vascular and interstitial congestive changes persist but there is improvement from 03/13/2015, with clearance of much of the alveolar edema.  IMPRESSION: New right jugular central line. No  pneumothorax. Congestive heart failure, improved.   Electronically Signed   By: Ellery Plunk M.D.   On: 03/15/2015 01:34   Dg Chest Port 1 View  03/13/2015   CLINICAL DATA:  Coronary artery disease.  MI.  Nausea, chest pain  EXAM: PORTABLE CHEST - 1 VIEW  COMPARISON:  03/03/2015  FINDINGS: There is cardiomegaly. Prior CABG. Diffuse interstitial prominence and perihilar/lower lobe opacities, most compatible with edema/ CHF. Suspect small effusions. No acute bony abnormality.  IMPRESSION: Mild CHF.   Electronically Signed   By: Charlett Nose M.D.   On: 03/13/2015 17:01     Medications:   . dextrose 50 mL/hr at 03/14/15 2117  . pantoprozole (PROTONIX) infusion 8 mg/hr (03/14/15 2047)   . ampicillin-sulbactam (UNASYN) IV  1.5 g Intravenous Q12H  . cefTRIAXone (ROCEPHIN)  IV  1 g Intravenous Q24H  . [START ON 03/16/2015] epoetin (EPOGEN/PROCRIT) injection  10,000 Units Intravenous Once  . fluconazole (DIFLUCAN) IV  100 mg Intravenous Q24H  . insulin aspart  0-15 Units Subcutaneous TID WC  . insulin detemir  14 Units Subcutaneous Daily   sodium chloride, sodium chloride, [START ON 03/16/2015] sodium chloride, [START ON 03/16/2015] sodium chloride, hydrALAZINE, LORazepam, ondansetron (ZOFRAN) IV  Assessment/ Plan:  44 y.o. 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 TTS  1. ESRD on HD: With significant edema. Seems to be drinking a lot and has increased intradialytic weight gain. Getting off dialysis early - Stayed for entire treatment yesterday but he got lorazepam before HD and slept the entire treatment.  Plan for hemodialysis treatment on TTS schedule. HD treatment for  tomorrow if here.    2. Anemia of CKD: continue epogen with HD. hgb 10 - admitted with GI bleed. No more episodes. Endoscopy as per GI.    3. SHPTH: last phos 3.4, pt not on binders at the moment.  4. Hypertension: well controlled currently. History of difficult to control - Home regimen of clonidine, carvedilol, lisinopril, hydralazine. imdur   LOS: 2 Diany Formosa 5/6/20162:18 PM

## 2015-03-15 NOTE — Plan of Care (Signed)
Problem: Discharge Progression Outcomes Goal: Other Discharge Outcomes/Goals Outcome: Progressing Pt was going to discharge today, but pt appealed discharge, care management aware. EGD would be done one Monday.

## 2015-03-16 LAB — RENAL FUNCTION PANEL
ANION GAP: 11 (ref 5–15)
Albumin: 2.4 g/dL — ABNORMAL LOW (ref 3.5–5.0)
BUN: 28 mg/dL — AB (ref 6–20)
CHLORIDE: 93 mmol/L — AB (ref 101–111)
CO2: 25 mmol/L (ref 22–32)
Calcium: 7.9 mg/dL — ABNORMAL LOW (ref 8.9–10.3)
Creatinine, Ser: 3.76 mg/dL — ABNORMAL HIGH (ref 0.61–1.24)
GFR calc Af Amer: 21 mL/min — ABNORMAL LOW (ref >60)
GFR calc non Af Amer: 18 mL/min — ABNORMAL LOW (ref >60)
Glucose, Bld: 324 mg/dL — ABNORMAL HIGH (ref 65–99)
PHOSPHORUS: 3.4 mg/dL (ref 2.5–4.6)
Potassium: 4.2 mmol/L (ref 3.5–5.1)
Sodium: 129 mmol/L — ABNORMAL LOW (ref 135–145)

## 2015-03-16 LAB — GLUCOSE, CAPILLARY
GLUCOSE-CAPILLARY: 121 mg/dL — AB (ref 70–99)
Glucose-Capillary: 343 mg/dL — ABNORMAL HIGH (ref 70–99)
Glucose-Capillary: 106 mg/dL — ABNORMAL HIGH (ref 70–99)
Glucose-Capillary: 136 mg/dL — ABNORMAL HIGH (ref 70–99)

## 2015-03-16 LAB — CBC
HCT: 32.1 % — ABNORMAL LOW (ref 40.0–52.0)
HEMOGLOBIN: 10 g/dL — AB (ref 13.0–18.0)
MCH: 28.7 pg (ref 26.0–34.0)
MCHC: 31.2 g/dL — AB (ref 32.0–36.0)
MCV: 91.9 fL (ref 80.0–100.0)
PLATELETS: 132 10*3/uL — AB (ref 150–440)
RBC: 3.49 MIL/uL — AB (ref 4.40–5.90)
RDW: 17.3 % — AB (ref 11.5–14.5)
WBC: 5.1 10*3/uL (ref 3.8–10.6)

## 2015-03-16 MED ORDER — CEPHALEXIN 250 MG PO CAPS
250.0000 mg | ORAL_CAPSULE | Freq: Two times a day (BID) | ORAL | Status: DC
  Administered 2015-03-16: 250 mg via ORAL
  Filled 2015-03-16 (×2): qty 1

## 2015-03-16 MED ORDER — AMOXICILLIN-POT CLAVULANATE 500-125 MG PO TABS
1.0000 | ORAL_TABLET | Freq: Every day | ORAL | Status: DC
  Administered 2015-03-16 – 2015-03-18 (×3): 500 mg via ORAL
  Filled 2015-03-16 (×4): qty 1

## 2015-03-16 MED ORDER — CARVEDILOL 25 MG PO TABS
25.0000 mg | ORAL_TABLET | Freq: Two times a day (BID) | ORAL | Status: DC
Start: 2015-03-16 — End: 2015-03-19
  Administered 2015-03-16 – 2015-03-19 (×6): 25 mg via ORAL
  Filled 2015-03-16 (×7): qty 1

## 2015-03-16 MED ORDER — CLONIDINE HCL 0.1 MG PO TABS
0.1000 mg | ORAL_TABLET | Freq: Two times a day (BID) | ORAL | Status: DC
  Administered 2015-03-16 – 2015-03-19 (×6): 0.1 mg via ORAL
  Filled 2015-03-16 (×7): qty 1

## 2015-03-16 MED ORDER — INSULIN ASPART 100 UNIT/ML ~~LOC~~ SOLN
4.0000 [IU] | Freq: Three times a day (TID) | SUBCUTANEOUS | Status: DC
  Filled 2015-03-16 (×14): qty 0.04

## 2015-03-16 MED ORDER — PAROXETINE HCL 20 MG PO TABS
20.0000 mg | ORAL_TABLET | Freq: Every day | ORAL | Status: DC
  Administered 2015-03-16 – 2015-03-19 (×4): 20 mg via ORAL
  Filled 2015-03-16 (×4): qty 1

## 2015-03-16 MED ORDER — ISOSORBIDE MONONITRATE ER 30 MG PO TB24
30.0000 mg | ORAL_TABLET | Freq: Two times a day (BID) | ORAL | Status: DC
  Administered 2015-03-16 – 2015-03-19 (×6): 30 mg via ORAL
  Filled 2015-03-16 (×7): qty 1

## 2015-03-16 MED ORDER — AMITRIPTYLINE HCL 25 MG PO TABS
25.0000 mg | ORAL_TABLET | Freq: Every day | ORAL | Status: DC
  Administered 2015-03-16 – 2015-03-18 (×3): 25 mg via ORAL
  Filled 2015-03-16 (×4): qty 1

## 2015-03-16 MED ORDER — LEVOTHYROXINE SODIUM 75 MCG PO TABS
75.0000 ug | ORAL_TABLET | Freq: Every day | ORAL | Status: DC
  Administered 2015-03-17 – 2015-03-19 (×3): 75 ug via ORAL
  Filled 2015-03-16 (×4): qty 1

## 2015-03-16 MED ORDER — FLUCONAZOLE 100 MG PO TABS
100.0000 mg | ORAL_TABLET | Freq: Every day | ORAL | Status: DC
  Administered 2015-03-16 – 2015-03-19 (×4): 100 mg via ORAL
  Filled 2015-03-16 (×4): qty 1

## 2015-03-16 MED ORDER — FLUCONAZOLE 100MG IVPB
100.0000 mg | INTRAVENOUS | Status: DC
  Administered 2015-03-16: 100 mg via INTRAVENOUS
  Filled 2015-03-16 (×3): qty 50

## 2015-03-16 MED ORDER — ROSUVASTATIN CALCIUM 20 MG PO TABS
20.0000 mg | ORAL_TABLET | Freq: Every day | ORAL | Status: DC
  Administered 2015-03-16 – 2015-03-18 (×3): 20 mg via ORAL
  Filled 2015-03-16 (×4): qty 1

## 2015-03-16 MED ORDER — FUROSEMIDE 80 MG PO TABS
80.0000 mg | ORAL_TABLET | Freq: Two times a day (BID) | ORAL | Status: DC
  Administered 2015-03-16 – 2015-03-19 (×6): 80 mg via ORAL
  Filled 2015-03-16 (×8): qty 1

## 2015-03-16 MED ORDER — LISINOPRIL 10 MG PO TABS
10.0000 mg | ORAL_TABLET | Freq: Every day | ORAL | Status: DC
  Administered 2015-03-16 – 2015-03-19 (×4): 10 mg via ORAL
  Filled 2015-03-16 (×4): qty 1

## 2015-03-16 MED ORDER — CALCIUM CARBONATE ANTACID 500 MG PO CHEW
2.0000 | CHEWABLE_TABLET | ORAL | Status: DC | PRN
  Filled 2015-03-16: qty 2

## 2015-03-16 NOTE — Progress Notes (Addendum)
[facility] - [place] at [facility]   PATIENT NAME: Taylor [name]    MR#:  [id number]  DATE OF BIRTH:  1971/08/18  SUBJECTIVE:  hper nursing had very small amount of emesis last night no blood No abd pain nor nausea eating breakfast this am   REVIEW OF SYSTEMS:    Review of Systems  Constitutional: Negative for fever, chills, weight loss and malaise/fatigue.  HENT: Negative for hearing loss.   Cardiovascular: Negative for chest pain, palpitations and leg swelling.  Gastrointestinal: Negative for heartburn, abdominal pain, diarrhea and constipation.       Yesterday had very small amount of emesis no blood   Genitourinary: Negative for dysuria and frequency.  Neurological: Negative for dizziness.    Tolerating Diet:yes this am     DRUG ALLERGIES:   Allergies  Allergen Reactions  . Amoxicillin-Pot Clavulanate Nausea And Vomiting  . Hydromorphone Hcl Other (See Comments)    Reaction:  Unknown  . Rifampin Nausea And Vomiting    VITALS:  Blood pressure 158/83, pulse 93, temperature 97.5 F (36.4 C), temperature source Oral, resp. rate 18, height 6' (1.829 m), weight 90.266 kg (199 lb), SpO2 100 %.  PHYSICAL EXAMINATION:   Physical Exam  Genitourinary:  Has SP cath       LABORATORY PANEL:   CBC  Recent Labs Lab 03/15/15 0634  WBC 5.4  HGB 9.9*  HCT 31.5*  PLT 123*   ------------------------------------------------------------------------------------------------------------------  Chemistries   Recent Labs Lab 03/14/15 0844  NA 133*  K 4.9  CL 93*  CO2 28  GLUCOSE 224*  BUN 32*  CREATININE 3.68*  CALCIUM 8.5*   ------------------------------------------------------------------------------------------------------------------  Cardiac Enzymes  Recent Labs Lab 03/13/15 1544  TROPONINI 0.03    ------------------------------------------------------------------------------------------------------------------  RADIOLOGY:  Dg Chest 1v Repeat Same Day  03/15/2015   CLINICAL DATA:  Central venous line placement  EXAM: CHEST - 1 VIEW SAME DAY  COMPARISON:  Radiograph 03/15/2015  FINDINGS: The right central venous line in her tract consistent tip in the distal SVC. Stable enlarged cardiac silhouette. Central venous pulmonary congestion is unchanged. No pneumothorax.  IMPRESSION: Right central venous line with tip in distal SVC.   Electronically Signed   By: Genevive Bi M.D.   On: 03/15/2015 01:56   Dg Chest Port 1 View  03/15/2015   CLINICAL DATA:  Central line placement  EXAM: PORTABLE CHEST - 1 VIEW  COMPARISON:  03/13/2015  FINDINGS: There is a new right jugular central line with tip in the cavoatrial junction. There is no pneumothorax. There is moderate unchanged cardiomegaly. Vascular and interstitial congestive changes persist but there is improvement from 03/13/2015, with clearance of much of the alveolar edema.  IMPRESSION: New right jugular central line. No pneumothorax. Congestive heart failure, improved.   Electronically Signed   By: Ellery Plunk M.D.   On: 03/15/2015 01:34     ASSESSMENT AND PLAN:   44 y/o Male with hx of diabetes and recent scrotal abscess presented with hematemesis.   1.Hematemesis: Hemoglobin is stable and has been stable since admission. He did not have any episodes of hematemesis since admission. Since he could not have an EGD on 5/6 as he ate breakfast and he was medically stable without issues with his hemoglobin or symptoms, it was decided that he could have this procedure performed as an outpatient. However, he has appealed the discharge.[name] He will continue on protonix. The plan os for EGD on Monday am outpatient. If we do not hear  back from MEDICARE then he will need to stay here through weekend and we will need to speak with Dr. Markham Jordan about  EGD on MOnday. HE should be NPO on Monday  2. History of scrotal abscess: Patient was recently discharged on Keflex and Augmentin as well as Diflucan. He's currently on IV Rocephin, UNASYN and IV give Diflucan. Infectious disease consultation appreciated. I also spoke with UROLOGY. Patient ws seen on Monday by Dr. Apolinar Junes and she says he needs to follow up Dr. Franchot Erichsen who performed original penile implant. Patient will have an appointment made prior to discharge.  3. History of end-stage renal disease: Nephrology was consulted for hemodialysis.He underwent dialysis while in the hospital..  4. Type 1 diabetes: Patient is on Levemir 14 units at home. I appreciate diabetes consultation. He is on sliding scale insulin. His blood sugars were elevated this am. We will need restart his home medications  5. Hypertension essential: I have restarted outpatient medications to better control his blood pressure.   6. Hypothyroidism He is on Synthroid.     All the records are reviewed and case discussed with Care Management/Social Worker.  CODE STATUS: LIMITEDl  TOTAL TIME TAKING CARE OF THIS PATIENT: 33 minutes.   POSSIBLE D/C TODAY he is medically stable for d/c and plan to have outpatient EGD. We are awaiting MEDICARE for HIS APPEAL   Leianna Barga M.D on 03/16/2015 at 9:02 AM  Between 7am to 6pm - Pager - [phone] After 6pm go to [email or web address] - password [email or web address] [facility]  [facility]  Office  [phone]  CC: Primary care physician; [name], MD

## 2015-03-16 NOTE — Plan of Care (Signed)
Problem: Discharge Progression Outcomes Goal: Discharge plan in place and appropriate Individualization of care Pt is from [facility]. H/o HTN, Diabetes type 1, renal failure and depression -controlled with home meds.  AV fistula-L upper arm. On hemodialysis.

## 2015-03-16 NOTE — Progress Notes (Signed)
HD tx start 

## 2015-03-16 NOTE — Progress Notes (Signed)
[facility]  ROUNDING NOTE   Subjective:   Seen and examined on hemodialysis. Tolerating treatment well.  UF goal of 2.5. Significant peripheral edema  Objective:  Vital signs in last 24 hours:  Temp:  [97.5 F (36.4 C)-98 F (36.7 C)] 97.7 F (36.5 C) (05/07 0900) Pulse Rate:  [93-105] 94 (05/07 1200) Resp:  [13-21] 17 (05/07 1200) BP: (138-183)/(75-105) 175/86 mmHg (05/07 1200) SpO2:  [95 %-100 %] 100 % (05/07 0900) Weight:  [100.8 kg (222 lb 3.6 oz)] 100.8 kg (222 lb 3.6 oz) (05/07 0900)  Weight change:  Filed Weights   03/14/15 0500 03/15/15 0500 03/16/15 0900  Weight: 98.93 kg (218 lb 1.6 oz) 90.266 kg (199 lb) 100.8 kg (222 lb 3.6 oz)    Intake/Output: I/O last 3 completed shifts: In: 240 [P.O.:240] Out: -    Intake/Output this shift:  Total I/O In: -  Out: 900 [Urine:900]  General: Not in acute distress Head: [place]/AT, poor dentition, moist oral mucosal membranes Neck: supple, trachea midline CVS: regular rate and rhythm, no murmurs Resp: clear to auscultation bilaterally ABD: soft, nontender EXT: 2+ bilateral lower extremity edema Neuro: nonfocal. GU: scrotal edema Access: left arm AVF   Basic Metabolic Panel:  Recent Labs Lab 03/11/15 0900 03/13/15 1544 03/14/15 0202 03/14/15 0844  NA 130* 136 133* 133*  K 5.1 4.7 4.6 4.9  CL 94* 96* 94* 93*  CO2 28 31 29 28   GLUCOSE 277* 197* 176* 224*  BUN 42* 29* 30* 32*  CREATININE 3.61* 3.31* 3.47* 3.68*  CALCIUM 8.4* 8.8* 8.4* 8.5*  PHOS 3.3  --   --  3.4    Liver Function Tests:  Recent Labs Lab 03/11/15 0900 03/14/15 0844  ALBUMIN 2.2* 2.3*   No results for input(s): LIPASE, AMYLASE in the last 168 hours. No results for input(s): AMMONIA in the last 168 hours.  CBC:  Recent Labs Lab 03/13/15 1544 03/14/15 0152 03/14/15 0202 03/14/15 0848 03/15/15 0634 03/16/15 0920  WBC 5.3  --  5.4 5.4 5.4 5.1  HGB 10.2* 9.8* 9.9* 10.0* 9.9* 10.0*  HCT 32.3* 31.2* 31.4* 32.0* 31.5*  32.1*  MCV 92.1  --  92.4 92.4 92.0 91.9  PLT 105*  --  101* 98* 123* 132*    Cardiac Enzymes:  Recent Labs Lab 03/13/15 1544  TROPONINI 0.03    BNP: Invalid input(s): POCBNP  CBG:  Recent Labs Lab 03/15/15 1121 03/15/15 1623 03/15/15 2219 03/15/15 2220 03/16/15 0741  GLUCAP 138* 270* 451* 449* 343*    Microbiology: Results for orders placed or performed during the hospital encounter of 03/13/15  Stool culture     Status: None (Preliminary result)   Collection Time: 03/14/15  9:26 PM  Result Value Ref Range Status   Specimen Description STOOL  Final   Special Requests Normal  Final   Culture   Final    REDUCED NORMAL FECAL FLORA NO SALMONELLA OR SHIGELLA ISOLATED    Report Status PENDING  Incomplete    Coagulation Studies:  Recent Labs  03/14/15 0202  LABPROT 15.7*  INR 1.23    Urinalysis: No results for input(s): COLORURINE, LABSPEC, PHURINE, GLUCOSEU, HGBUR, BILIRUBINUR, KETONESUR, PROTEINUR, UROBILINOGEN, NITRITE, LEUKOCYTESUR in the last 72 hours.  Invalid input(s): APPERANCEUR    Imaging: Dg Chest 1v Repeat Same Day  03/15/2015   CLINICAL DATA:  Central venous line placement  EXAM: CHEST - 1 VIEW SAME DAY  COMPARISON:  Radiograph 03/15/2015  FINDINGS: The right central venous line in her tract  consistent tip in the distal SVC. Stable enlarged cardiac silhouette. Central venous pulmonary congestion is unchanged. No pneumothorax.  IMPRESSION: Right central venous line with tip in distal SVC.   Electronically Signed   By: Genevive Bi M.D.   On: 03/15/2015 01:56   Dg Chest Port 1 View  03/15/2015   CLINICAL DATA:  Central line placement  EXAM: PORTABLE CHEST - 1 VIEW  COMPARISON:  03/13/2015  FINDINGS: There is a new right jugular central line with tip in the cavoatrial junction. There is no pneumothorax. There is moderate unchanged cardiomegaly. Vascular and interstitial congestive changes persist but there is improvement from 03/13/2015, with  clearance of much of the alveolar edema.  IMPRESSION: New right jugular central line. No pneumothorax. Congestive heart failure, improved.   Electronically Signed   By: Ellery Plunk M.D.   On: 03/15/2015 01:34     Medications:   . pantoprozole (PROTONIX) infusion 8 mg/hr (03/16/15 0437)   . amitriptyline  25 mg Oral QHS  . amoxicillin-clavulanate  1 tablet Oral QHS  . carvedilol  25 mg Oral BID WC  . cephALEXin  250 mg Oral Q12H  . cloNIDine  0.1 mg Oral BID  . fluconazole (DIFLUCAN) IV  100 mg Intravenous Q24H  . fluconazole  100 mg Oral Daily  . furosemide  80 mg Oral BID  . insulin aspart  0-15 Units Subcutaneous TID WC  . insulin aspart  4 Units Subcutaneous TID WC  . insulin detemir  7 Units Subcutaneous Daily  . isosorbide mononitrate  30 mg Oral BID  . [START ON 03/17/2015] levothyroxine  75 mcg Oral QAC breakfast  . lisinopril  10 mg Oral Daily  . PARoxetine  20 mg Oral Daily  . rosuvastatin  20 mg Oral QHS   sodium chloride, sodium chloride, sodium chloride, sodium chloride, calcium carbonate, diphenoxylate-atropine, hydrALAZINE, LORazepam, ondansetron (ZOFRAN) IV  Assessment/ Plan:  44 y.o. 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 TTS  1. ESRD on HD: With significant edema. Seems to be drinking a lot and has increased intradialytic weight gain. Getting off dialysis early - Stayed for entire treatment last two treatments.   Plan for hemodialysis treatment on TTS schedule. HD treatment for [place] if here.    2. Anemia of CKD: continue epogen with HD. hgb 10 - admitted with GI bleed. No more episodes. Endoscopy as per GI.    3. SHPTH: last  phos 3.4, pt not on binders at the moment.  4. Hypertension: well controlled currently. History of difficult to control - Home regimen of clonidine, carvedilol, lisinopril, hydralazine. imdur   LOS: 3 Mady Oubre 5/7/201612:20 PM

## 2015-03-16 NOTE — Plan of Care (Signed)
Problem: Discharge Progression Outcomes Goal: Other Discharge Outcomes/Goals Outcome: Not Progressing SBP>160 at bed time, hydralazine given with improvement. BS >400, stopped dextrose infusion, MD notified, 7units of levemir given. Ativan given for anxity, slept well. Vomitted once small light green emesis, no blood noticed. Early breakfast tray provided. Scheduled for hemodialysis at 0800.

## 2015-03-16 NOTE — Progress Notes (Signed)
Pre-hd tx 

## 2015-03-16 NOTE — Progress Notes (Addendum)
Notified Dr. Clint Guy of duplicate order for insulin levemir. MD verbalized to d/c insulin levemir 14units.

## 2015-03-16 NOTE — Plan of Care (Signed)
Problem: Discharge Progression Outcomes Goal: Other Discharge Outcomes/Goals Outcome: Progressing Pt had dialysis today, removed . protonix gtt infusing. No c/o pain. Ativan given 1x, Pt stated relief.

## 2015-03-17 LAB — GLUCOSE, CAPILLARY
GLUCOSE-CAPILLARY: 160 mg/dL — AB (ref 70–99)
Glucose-Capillary: 177 mg/dL — ABNORMAL HIGH (ref 70–99)
Glucose-Capillary: 220 mg/dL — ABNORMAL HIGH (ref 70–99)
Glucose-Capillary: 212 mg/dL — ABNORMAL HIGH (ref 70–99)
Glucose-Capillary: 138 mg/dL — ABNORMAL HIGH (ref 70–99)

## 2015-03-17 LAB — HEPATITIS B SURFACE ANTIBODY,QUALITATIVE: Hep B S Ab: NONREACTIVE

## 2015-03-17 LAB — HEPATITIS B SURFACE ANTIGEN: Hepatitis B Surface Ag: NEGATIVE — AB

## 2015-03-17 LAB — HEPATITIS B CORE ANTIBODY, TOTAL: HEP B C TOTAL AB: NEGATIVE

## 2015-03-17 MED ORDER — SODIUM CHLORIDE 0.9 % IJ SOLN
3.0000 mL | Freq: Four times a day (QID) | INTRAMUSCULAR | Status: DC
  Administered 2015-03-17 – 2015-03-19 (×5): 3 mL via INTRAVENOUS

## 2015-03-17 MED ORDER — CEPHALEXIN 250 MG PO CAPS
250.0000 mg | ORAL_CAPSULE | ORAL | Status: DC
  Administered 2015-03-17 – 2015-03-19 (×5): 250 mg via ORAL
  Filled 2015-03-17 (×6): qty 1

## 2015-03-17 MED ORDER — OXYCODONE HCL 5 MG PO TABS
5.0000 mg | ORAL_TABLET | ORAL | Status: DC | PRN
  Administered 2015-03-17 – 2015-03-19 (×4): 5 mg via ORAL
  Filled 2015-03-17 (×4): qty 1

## 2015-03-17 MED ORDER — METOCLOPRAMIDE HCL 5 MG/ML IJ SOLN
5.0000 mg | Freq: Three times a day (TID) | INTRAMUSCULAR | Status: DC
  Administered 2015-03-17 – 2015-03-18 (×3): 5 mg via INTRAVENOUS
  Administered 2015-03-18: 14:00:00 via INTRAVENOUS
  Filled 2015-03-17: qty 2
  Filled 2015-03-17 (×3): qty 1
  Filled 2015-03-17 (×3): qty 2

## 2015-03-17 MED ORDER — MORPHINE SULFATE 2 MG/ML IJ SOLN: 2.0000 mg | INTRAMUSCULAR | Status: DC | PRN

## 2015-03-17 MED ORDER — PANTOPRAZOLE SODIUM 40 MG PO TBEC
40.0000 mg | DELAYED_RELEASE_TABLET | Freq: Two times a day (BID) | ORAL | Status: DC
  Administered 2015-03-17 – 2015-03-19 (×4): 40 mg via ORAL
  Filled 2015-03-17 (×5): qty 1

## 2015-03-17 MED ORDER — ACETAMINOPHEN 325 MG PO TABS
650.0000 mg | ORAL_TABLET | Freq: Four times a day (QID) | ORAL | Status: DC | PRN
  Filled 2015-03-17: qty 2

## 2015-03-17 MED ORDER — SODIUM CHLORIDE 0.9 % IJ SOLN
3.0000 mL | INTRAMUSCULAR | Status: DC | PRN
  Administered 2015-03-18: 3 mL via INTRAVENOUS
  Filled 2015-03-17: qty 10

## 2015-03-17 NOTE — Progress Notes (Signed)
[facility]  ROUNDING NOTE   Subjective:   Hemodialysis yesterday. Tolerated treatment well. Uf of 2.5 litres.    Objective:  Vital signs in last 24 hours:  Temp:  [97.5 F (36.4 C)-98.6 F (37 C)] 97.5 F (36.4 C) (05/08 1255) Pulse Rate:  [83-93] 87 (05/08 1255) Resp:  [18-20] 20 (05/08 1255) BP: (151-171)/(74-87) 166/87 mmHg (05/08 1255) SpO2:  [100 %] 100 % (05/08 1255) Weight:  [98.431 kg (217 lb)] 98.431 kg (217 lb) (05/08 0525)  Weight change:  Filed Weights   03/16/15 0900 03/16/15 1223 03/17/15 0525  Weight: 100.8 kg (222 lb 3.6 oz) 98 kg (216 lb 0.8 oz) 98.431 kg (217 lb)    Intake/Output: I/O last 3 completed shifts: In: -  Out: 3400 [Urine:900; Other:2500]   Intake/Output this shift:  Total I/O In: 240 [P.O.:240] Out: 300 [Urine:300]  General: Not in acute distress Head: [place]/AT, poor dentition, moist oral mucosal membranes Neck: supple, trachea midline CVS: regular rate and rhythm, no murmurs Resp: clear to auscultation bilaterally ABD: soft, nontender EXT: 2+ bilateral lower extremity edema Neuro: nonfocal. GU: scrotal edema Access: left arm AVF   Basic Metabolic Panel:  Recent Labs Lab 03/11/15 0900 03/13/15 1544 03/14/15 0202 03/14/15 0844 03/16/15 0921  NA 130* 136 133* 133* 129*  K 5.1 4.7 4.6 4.9 4.2  CL 94* 96* 94* 93* 93*  CO2 28 31 29 28 25   GLUCOSE 277* 197* 176* 224* 324*  BUN 42* 29* 30* 32* 28*  CREATININE 3.61* 3.31* 3.47* 3.68* 3.76*  CALCIUM 8.4* 8.8* 8.4* 8.5* 7.9*  PHOS 3.3  --   --  3.4 3.4    Liver Function Tests:  Recent Labs Lab 03/11/15 0900 03/14/15 0844 03/16/15 0921  ALBUMIN 2.2* 2.3* 2.4*   No results for input(s): LIPASE, AMYLASE in the last 168 hours. No results for input(s): AMMONIA in the last 168 hours.  CBC:  Recent Labs Lab 03/13/15 1544 03/14/15 0152 03/14/15 0202 03/14/15 0848 03/15/15 0634 03/16/15 0920  WBC 5.3  --  5.4 5.4 5.4 5.1  HGB 10.2* 9.8* 9.9* 10.0* 9.9*  10.0*  HCT 32.3* 31.2* 31.4* 32.0* 31.5* 32.1*  MCV 92.1  --  92.4 92.4 92.0 91.9  PLT 105*  --  101* 98* 123* 132*    Cardiac Enzymes:  Recent Labs Lab 03/13/15 1544  TROPONINI 0.03    BNP: Invalid input(s): POCBNP  CBG:  Recent Labs Lab 03/16/15 1647 03/16/15 2128 03/17/15 0630 03/17/15 0746 03/17/15 1132  GLUCAP 121* 136* 177* 160* 220*    Microbiology: Results for orders placed or performed during the hospital encounter of 03/13/15  Stool culture     Status: None   Collection Time: 03/14/15  9:26 PM  Result Value Ref Range Status   Specimen Description STOOL  Final   Special Requests Normal  Final   Culture   Final    REDUCED NORMAL FECAL FLORA NO SALMONELLA OR SHIGELLA ISOLATED    Report Status 03/17/2015 FINAL  Final    Coagulation Studies: No results for input(s): LABPROT, INR in the last 72 hours.  Urinalysis: No results for input(s): COLORURINE, LABSPEC, PHURINE, GLUCOSEU, HGBUR, BILIRUBINUR, KETONESUR, PROTEINUR, UROBILINOGEN, NITRITE, LEUKOCYTESUR in the last 72 hours.  Invalid input(s): APPERANCEUR    Imaging: No results found.   Medications:   . pantoprozole (PROTONIX) infusion 8 mg/hr (03/17/15 0349)   . amitriptyline  25 mg Oral QHS  . amoxicillin-clavulanate  1 tablet Oral QHS  . carvedilol  25 mg  Oral BID WC  . cephALEXin  250 mg Oral 2 times per day  . cloNIDine  0.1 mg Oral BID  . fluconazole  100 mg Oral Daily  . furosemide  80 mg Oral BID  . insulin aspart  0-15 Units Subcutaneous TID WC  . insulin detemir  7 Units Subcutaneous Daily  . isosorbide mononitrate  30 mg Oral BID  . levothyroxine  75 mcg Oral QAC breakfast  . lisinopril  10 mg Oral Daily  . metoCLOPramide (REGLAN) injection  5 mg Intravenous 3 times per day  . PARoxetine  20 mg Oral Daily  . rosuvastatin  20 mg Oral QHS   acetaminophen, calcium carbonate, diphenoxylate-atropine, hydrALAZINE, LORazepam, morphine injection, ondansetron (ZOFRAN) IV,  oxyCODONE  Assessment/ Plan:  45 y.o. 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 TTS  1. ESRD on HD: With significant edema. Seems to be drinking a lot and has increased intradialytic weight gain. Getting off dialysis early - Stayed for entire treatment last two treatments.   Plan for hemodialysis treatment on TTS schedule. HD treatment for [place] if here.    2. Anemia of CKD: continue epogen with HD. hgb 10 - admitted with GI bleed. No more episodes. Endoscopy as per GI.  - switch protonix gtt to PO due to volume overload.    3. SHPTH: last phos 3.4, pt not on binders at the moment.  4. Hypertension: well controlled currently. History of difficult to control - Home regimen of clonidine, carvedilol, lisinopril, hydralazine. imdur   LOS: 4 Dhamar Gregory 5/8/20161:59 PM

## 2015-03-17 NOTE — Progress Notes (Addendum)
Notified Dr Clint Guy that pt c/o scrotum pain. No pain meds ordered. Pt verbalized that he is not allergic to hydromorphone.

## 2015-03-17 NOTE — Progress Notes (Signed)
Paged Hospitalist, but no response yet - duplicate order of Diflucan. Notified day RN to follow up.

## 2015-03-17 NOTE — Progress Notes (Signed)
Notified Dr. Sheryle Hail of Potassium 2.8. Asymptomatic.

## 2015-03-17 NOTE — Progress Notes (Signed)
[facility] - [place] at [facility]   PATIENT NAME: Taylor [name]    MR#:  [id number]  DATE OF BIRTH:  1971/05/18  SUBJECTIVE:  Has some nausea no vomiting no hematemesis  REVIEW OF SYSTEMS:    Review of Systems  Constitutional: Negative for fever, chills and weight loss.  Eyes: Negative for photophobia.  Cardiovascular: Negative for chest pain and orthopnea.  Gastrointestinal: Positive for heartburn and nausea. Negative for vomiting, diarrhea, constipation and blood in stool.  Genitourinary: Negative for dysuria.  Musculoskeletal: Negative for myalgias.  Neurological: Negative for dizziness, tingling, seizures and headaches.  All other systems reviewed and are negative.   Tolerating Diet:yes this am     DRUG ALLERGIES:   Allergies  Allergen Reactions  . Amoxicillin-Pot Clavulanate Nausea And Vomiting  . Hydromorphone Hcl Other (See Comments)    Reaction:  Unknown  . Rifampin Nausea And Vomiting    VITALS:  Blood pressure 156/75, pulse 83, temperature 97.6 F (36.4 C), temperature source Oral, resp. rate 18, height 6' (1.829 m), weight 98.431 kg (217 lb), SpO2 100 %.  PHYSICAL EXAMINATION:   Physical Exam  Constitutional: He is well-developed, well-nourished, and in no distress.  HENT:  Head: Normocephalic and atraumatic.  Eyes: Pupils are equal, round, and reactive to light.  Neck: Normal range of motion. Neck supple. No tracheal deviation present.  Cardiovascular: Normal rate.   Pulmonary/Chest: No respiratory distress. He has no wheezes. He has no rales.  Abdominal: Soft. Bowel sounds are normal. He exhibits no distension. There is no rebound and no guarding.  Genitourinary:  Has SP cath   Musculoskeletal: Normal range of motion. He exhibits edema.  Amputation of toes   Neurological: He is alert. No cranial nerve deficit.  Skin: Skin is warm and dry.  Psychiatric: Affect normal.      LABORATORY PANEL:    CBC  Recent Labs Lab 03/16/15 0920  WBC 5.1  HGB 10.0*  HCT 32.1*  PLT 132*   ------------------------------------------------------------------------------------------------------------------  Chemistries   Recent Labs Lab 03/16/15 0921  NA 129*  K 4.2  CL 93*  CO2 25  GLUCOSE 324*  BUN 28*  CREATININE 3.76*  CALCIUM 7.9*   ------------------------------------------------------------------------------------------------------------------  Cardiac Enzymes  Recent Labs Lab 03/13/15 1544  TROPONINI 0.03   ------------------------------------------------------------------------------------------------------------------  RADIOLOGY:  No results found.   ASSESSMENT AND PLAN:   44 y/o Male with hx of diabetes and recent scrotal abscess presented with hematemesis.   1.Hematemesis: Hemoglobin is stable and has been stable since admission. He did not have any episodes of hematemesis since admission. Since he could not have an EGD on 5/6 as he ate breakfast and he was medically stable without issues with his hemoglobin or symptoms, it was decided that he could have this procedure performed as an outpatient. However, he has appealed the discharge.[name] He will continue on protonix. The plan is for EGD tomorrow am. I have spoke with Dr. Preston Fleeting who will arrange EGD. PT will be NPO after MN.   2. History of scrotal abscess: Patient was recently discharged on Keflex and Augmentin as well as Diflucan. I have changed him back to hi PO medications. I also spoke with UROLOGY. Patient was seen last Monday by Dr. Alvan Dame and she says he needs to follow up Dr. Franchot Erichsen who performed original penile implant. Patient will have an appointment made prior to discharge. ID consultation appreciated. Continue current antibiotics.  3. History of end-stage renal disease: Nephrology was consulted for hemodialysis.His regular  schedule is Tuesday/thur/sat  4. Type 1 diabetes: Patient is on  Levemir 7 units in am and14 units at home. I appreciate diabetes consultation. He is on sliding scale insulin. His blood sugars are fluctuating. I will continue current management with SSI.   5. Hypertension essential: I have restarted outpatient medications to better control his blood pressure.   6. Hypothyroidism He is on Synthroid.     All the records are reviewed and case discussed with patient  CODE STATUS: LIMITED  TOTAL TIME TAKING CARE OF THIS PATIENT: 27 minutes.   he is medically stable for d/c but has appealed discharge.  Plan for EGD in am tomorrow then may be d/c back to facility.    Kela Baccari M.D on 03/17/2015 at 10:36 AM  Between 7am to 6pm - Pager - [phone] After 6pm go to [email or web address] - password [email or web address] [facility]  [facility]  Office  [phone]  CC: Primary care physician; [name], MD

## 2015-03-17 NOTE — Plan of Care (Signed)
Problem: Discharge Progression Outcomes Goal: Other Discharge Outcomes/Goals Outcome: Progressing Pt demonstrates poor motivation to participate in mobility in bed/activity. Slept at frequent intervals. Ordered different diet selections for meals after trays delivered to room. Zofran x 1 effective for nausea with no emesis. Reglan added to medication list. No co's pain. No sign/symptom bleeding.  Afebrile. Protonix drip discontinued and changed to po form. Pt insistent he get lomotil even though he had no stools on our shift. Central line intact.

## 2015-03-17 NOTE — Plan of Care (Signed)
Problem: Discharge Progression Outcomes Goal: Discharge plan in place and appropriate Individualization of care Pt is from [facility]. H/o HTN, Diabetes type 1, renal failure and depression -controlled with home meds.   AV fistula-L upper arm. On hemodialysis Goal: Other Discharge Outcomes/Goals Plan of Care Progress to Goal: VSS. No c/o n/v. No signs of bleeding. 2xloose brown stools tonight lomotil given. C/o scrotal pain once, MD notified, pain meds ordered and given with improvement. Ativan given for anxiety with improvement. CBG stable.

## 2015-03-17 NOTE — Progress Notes (Signed)
Subjective: Patient seen for recent hematemesis. Patient is doing well. He states he has no nausea no further emesis, there is no abdominal pain. He did have a bowel movement that was brown in color and not black.  Objective: Vital signs in last 24 hours: Temp:  [97.5 F (36.4 C)-98.6 F (37 C)] 97.5 F (36.4 C) (05/08 1255) Pulse Rate:  [83-93] 87 (05/08 1255) Resp:  [18-20] 20 (05/08 1255) BP: (151-171)/(74-87) 166/87 mmHg (05/08 1255) SpO2:  [100 %] 100 % (05/08 1255) Weight:  [98.431 kg (217 lb)] 98.431 kg (217 lb) (05/08 0525) Blood pressure 166/87, pulse 87, temperature 97.5 F (36.4 C), temperature source Oral, resp. rate 20, height 6' (1.829 m), weight 98.431 kg (217 lb), SpO2 100 %.   Intake/Output from previous day: 05/07 0701 - 05/08 0700 In: -  Out: 3400 [Urine:900]  Intake/Output this shift: Total I/O In: 240 [P.O.:240] Out: 300 [Urine:300]   General appearance:  Well-appearing, somewhat pale complected no acute distress Resp:  Clear to auscultation Cardio:  Regular rate and rhythm GI:  Mild distention however this is most likely due to some amount of anasarca. Bowel sounds are positive and normoactive. Nontender. Extremities:  1-2+ edema, no clubbing or cyanosis   Lab Results: Results for orders placed or performed during the hospital encounter of 03/13/15 (from the past 24 hour(s))  Glucose, capillary     Status: Abnormal   Collection Time: 03/16/15  4:47 PM  Result Value Ref Range   Glucose-Capillary 121 (H) 70 - 99 mg/dL  Glucose, capillary     Status: Abnormal   Collection Time: 03/16/15  9:28 PM  Result Value Ref Range   Glucose-Capillary 136 (H) 70 - 99 mg/dL  Glucose, capillary     Status: Abnormal   Collection Time: 03/17/15  6:30 AM  Result Value Ref Range   Glucose-Capillary 177 (H) 70 - 99 mg/dL  Glucose, capillary     Status: Abnormal   Collection Time: 03/17/15  7:46 AM  Result Value Ref Range   Glucose-Capillary 160 (H) 70 - 99 mg/dL    Comment 1 Notify RN   Glucose, capillary     Status: Abnormal   Collection Time: 03/17/15 11:32 AM  Result Value Ref Range   Glucose-Capillary 220 (H) 70 - 99 mg/dL   Comment 1 Notify RN   Glucose, capillary     Status: Abnormal   Collection Time: 03/17/15  4:20 PM  Result Value Ref Range   Glucose-Capillary 212 (H) 70 - 99 mg/dL   Comment 1 Notify RN       Recent Labs  03/15/15 0634 03/16/15 0920  WBC 5.4 5.1  HGB 9.9* 10.0*  HCT 31.5* 32.1*  PLT 123* 132*   BMET  Recent Labs  03/16/15 0921  NA 129*  K 4.2  CL 93*  CO2 25  GLUCOSE 324*  BUN 28*  CREATININE 3.76*  CALCIUM 7.9*   LFT  Recent Labs  03/16/15 0921  ALBUMIN 2.4*   PT/INR No results for input(s): LABPROT, INR in the last 72 hours. Hepatitis Panel  Recent Labs  03/16/15 0921  HEPBSAG Negative*   C-Diff No results for input(s): CDIFFTOX in the last 72 hours. No results for input(s): CDIFFPCR in the last 72 hours.   Studies/Results: No results found.  Scheduled Inpatient Medications:   . amitriptyline  25 mg Oral QHS  . amoxicillin-clavulanate  1 tablet Oral QHS  . carvedilol  25 mg Oral BID WC  . cephALEXin  250  mg Oral 2 times per day  . cloNIDine  0.1 mg Oral BID  . fluconazole  100 mg Oral Daily  . furosemide  80 mg Oral BID  . insulin aspart  0-15 Units Subcutaneous TID WC  . insulin detemir  7 Units Subcutaneous Daily  . isosorbide mononitrate  30 mg Oral BID  . levothyroxine  75 mcg Oral QAC breakfast  . lisinopril  10 mg Oral Daily  . metoCLOPramide (REGLAN) injection  5 mg Intravenous 3 times per day  . pantoprazole  40 mg Oral BID  . PARoxetine  20 mg Oral Daily  . rosuvastatin  20 mg Oral QHS  . sodium chloride  3 mL Intravenous Q6H    Continuous Inpatient Infusions:     PRN Inpatient Medications:  acetaminophen, calcium carbonate, diphenoxylate-atropine, hydrALAZINE, LORazepam, morphine injection, ondansetron (ZOFRAN) IV, oxyCODONE, sodium  chloride  Miscellaneous:   Assessment:  1. Hematemesis. Not recurrent. No abdominal pain. No evidence of melena. Hemodynamically stable. Hemoglobin stable.  Plan:  EGD tomorrow with Dr. Mechele Collin.  Christena Deem MD 03/17/2015, 4:45 PM

## 2015-03-18 ENCOUNTER — Inpatient Hospital Stay: Payer: Medicare Other | Admitting: Anesthesiology

## 2015-03-18 ENCOUNTER — Encounter
Admission: EM | Disposition: A | Discharge status: Skilled Nursing Facility | Payer: Self-pay | Source: Home / Self Care | Attending: Internal Medicine

## 2015-03-18 ENCOUNTER — Encounter: Payer: Self-pay | Admitting: *Deleted

## 2015-03-18 ENCOUNTER — Ambulatory Visit
Admission: RE | Admit: 2015-03-18 | Payer: Medicare Other | Source: Ambulatory Visit | Admitting: Unknown Physician Specialty

## 2015-03-18 HISTORY — PX: ESOPHAGOGASTRODUODENOSCOPY: SHX5428

## 2015-03-18 LAB — GLUCOSE, CAPILLARY
GLUCOSE-CAPILLARY: 153 mg/dL — AB (ref 70–99)
Glucose-Capillary: 171 mg/dL — ABNORMAL HIGH (ref 70–99)
Glucose-Capillary: 191 mg/dL — ABNORMAL HIGH (ref 70–99)
Glucose-Capillary: 294 mg/dL — ABNORMAL HIGH (ref 70–99)
Glucose-Capillary: 281 mg/dL — ABNORMAL HIGH (ref 70–99)
Glucose-Capillary: 257 mg/dL — ABNORMAL HIGH (ref 70–99)

## 2015-03-18 SURGERY — EGD (ESOPHAGOGASTRODUODENOSCOPY)
Anesthesia: General

## 2015-03-18 MED ORDER — FENTANYL CITRATE (PF) 100 MCG/2ML IJ SOLN
INTRAMUSCULAR | Status: DC | PRN
  Administered 2015-03-18: 50 ug via INTRAVENOUS

## 2015-03-18 MED ORDER — PROPOFOL 10 MG/ML IV BOLUS
INTRAVENOUS | Status: DC | PRN
  Administered 2015-03-18: 20 mg via INTRAVENOUS
  Administered 2015-03-18: 30 mg via INTRAVENOUS

## 2015-03-18 MED ORDER — MIDAZOLAM HCL 2 MG/2ML IJ SOLN
INTRAMUSCULAR | Status: DC | PRN
  Administered 2015-03-18: 1 mg via INTRAVENOUS

## 2015-03-18 MED ORDER — PROPOFOL INFUSION 10 MG/ML OPTIME
INTRAVENOUS | Status: DC | PRN
  Administered 2015-03-18: 160 ug/kg/min via INTRAVENOUS

## 2015-03-18 MED ORDER — METOCLOPRAMIDE HCL 5 MG PO TABS
5.0000 mg | ORAL_TABLET | Freq: Three times a day (TID) | ORAL | Status: DC | PRN
  Filled 2015-03-18: qty 1

## 2015-03-18 MED ORDER — DEXTROSE-NACL 5-0.9 % IV SOLN
INTRAVENOUS | Status: DC
  Administered 2015-03-18 (×2): via INTRAVENOUS

## 2015-03-18 NOTE — Progress Notes (Signed)
Spoke with Dr. Markham Jordan regarding NPO status and Patient's history of hypoglycemia.  MD gave order to start Patient on d5 ns @ 100 ml/hr to prevent hypoglycemia and will attempt to have his procedure started within a couple of hours.

## 2015-03-18 NOTE — H&P (View-Only) (Signed)
Pt was due to have EGD and ate some egg and this was cancelled.  His hgb is 9.9, plt 125, WBC 5.4 albumin 2.3 Chest clear, abd full, somewhat tight.  VSS afebrile.  I discussed with Dr. Mody and will schedule him for an EGD from [facility] for Monday at 7:30am. 

## 2015-03-18 NOTE — Progress Notes (Addendum)
03/18/15 (late entry 03/15/15 16:15)  Notified of patient appeal of discharge to [facility].  All requested documents faxed to [facility] and confirmation of receipt received.  CM checked [employer] website on 03/16/15, noted documents not received, called to Almyra Brace who refaxed all documents, confirmation of receipt received. Call to [facility], per representative only one packet of two received.  Almyra Brace refaxed second packet of documents, confirmation of receipt received. Numerous unsucessful calls to [employer] to determine receipt of medical record as required for determination.  Reassured that website is often behind actual medical record review progress.  Checked website several times on 03/17/15, no confirmation of receipt of medical record noted. Attempted to call 03/18/15, received message office is closed until 9AM, will call back then.  Pt remains inpt, planned EGD today. Jim Like RN CCM [facility]

## 2015-03-18 NOTE — Plan of Care (Signed)
Problem: Phase III Progression Outcomes Goal: H&H stablized <1gm drop in 24 hrs, no active bleeding Outcome: Progressing Patient had EGD today showed gastroparesis and gastritis, no active bleeding, patient planning to discharge after dialysis tomorrow [facility]

## 2015-03-18 NOTE — Plan of Care (Signed)
Problem: Discharge Progression Outcomes Goal: Discharge plan in place and appropriate Individualization of care Outcome: Progressing Pt is from [facility]. H/o HTN, Diabetes type 1, renal failure and depression -controlled with home meds.   AV fistula-L upper arm. On hemodialysis. Goal: Other Discharge Outcomes/Goals Outcome: Progressing No signs of bleed. No c/o n/v, scheduled reglan given. Pain med given once for L scrotal pain with improvement. Ativan given for anxiety with good effect. NPO since midnight for EGD.

## 2015-03-18 NOTE — Interval H&P Note (Signed)
History and Physical Interval Note:  03/18/2015 11:30 AM  Taylor [name]  has presented today for surgery, with the diagnosis of HEMA  The various methods of treatment have been discussed with the patient and family. After consideration of risks, benefits and other options for treatment, the patient has consented to  Procedure(s): ESOPHAGOGASTRODUODENOSCOPY (EGD) (N/A) as a surgical intervention .  The patient's history has been reviewed, patient examined, no change in status, stable for surgery.  I have reviewed the patient's chart and labs.  Questions were answered to the patient's satisfaction.     Jakyria Bleau

## 2015-03-18 NOTE — Transfer of Care (Signed)
Immediate Anesthesia Transfer of Care Note  Patient: Taylor [name]  Procedure(s) Performed: Procedure(s): ESOPHAGOGASTRODUODENOSCOPY (EGD) (N/A)  Patient Location: PACU  Anesthesia Type:General  Level of Consciousness: sedated  Airway & Oxygen Therapy: Patient Spontanous Breathing and Patient connected to nasal cannula oxygen  Post-op Assessment: Report given to RN and Post -op Vital signs reviewed and stable  Post vital signs: Reviewed and stable  Last Vitals:  Filed Vitals:   03/18/15 1127  BP: 157/83  Pulse: 84  Temp: 36.2 C  Resp: 19    Complications: No apparent anesthesia complications

## 2015-03-18 NOTE — Progress Notes (Signed)
Inpatient Diabetes Program Recommendations  AACE/ADA: New Consensus Statement on Inpatient Glycemic Control (2013)  Target Ranges:  Prepandial:   less than 140 mg/dL      [facility] postprandial:   less than 180 mg/dL (1-2 hours)      Critically ill patients:  140 - 180 mg/dL   Results for ATHEN, RIEL (MRN [id number]) as of 03/18/2015 10:30  Ref. Range 03/17/2015 07:46 03/17/2015 11:32 03/17/2015 16:20 03/17/2015 22:09 03/18/2015 07:24  Glucose-Capillary Latest Ref Range: 70-99 mg/dL 701 (H) 779 (H) 390 (H) 138 (H) 153 (H)   Diabetes history: DM1 Outpatient Diabetes medications: Levemir 14 units daily, Novolog 0-7 units ACHS (sliding scale) Current orders for Inpatient glycemic control: Levemir 7 units daily, Novolog 0-15 units TID with meals  Inpatient Diabetes Program Recommendations Insulin - Meal Coverage: Note patient is NPO for procedure scheduled for today. Once diet is resumed, please discontinue dextrose in IV fluids and order Novolog 4 units TID with meals for meal coverage if patient eats at least 50% of meal.  Thanks, Orlando Penner, RN, MSN, CCRN, CDE Diabetes Coordinator Inpatient Diabetes Program [phone] (Team Pager from 8am to 5pm) [phone] ([facility] office) [phone] [facility] office)

## 2015-03-18 NOTE — Anesthesia Postprocedure Evaluation (Signed)
  Anesthesia Post-op Note  Patient: Taylor [name]  Procedure(s) Performed: Procedure(s): ESOPHAGOGASTRODUODENOSCOPY (EGD) (N/A)  Anesthesia type:General  Patient location: PACU  Post pain: Pain level controlled  Post assessment: Post-op Vital signs reviewed, Patient's Cardiovascular Status Stable, Respiratory Function Stable, Patent Airway and No signs of Nausea or vomiting  Post vital signs: Reviewed and stable  Last Vitals:  Filed Vitals:   03/18/15 1310  BP: 159/82  Pulse:   Temp:   Resp:     Level of consciousness: awake, alert  and patient cooperative  Complications: No apparent anesthesia complications

## 2015-03-18 NOTE — Progress Notes (Signed)
[facility] - [place] at [facility]   PATIENT NAME: Taylor [name]    MR#:  [id number]  DATE OF BIRTH:  November 26, 1970  SUBJECTIVE:  Has some nausea no vomiting no hematemesis, just returned from endoscopy. No new c/o  REVIEW OF SYSTEMS:    Review of Systems  Constitutional: Negative for fever, chills and weight loss.  Eyes: Negative for photophobia.  Cardiovascular: Negative for chest pain and orthopnea.  Gastrointestinal: Positive for heartburn and nausea. Negative for vomiting, diarrhea, constipation and blood in stool.  Genitourinary: Negative for dysuria.  Musculoskeletal: Negative for myalgias.  Neurological: Negative for dizziness, tingling, seizures and headaches.  All other systems reviewed and are negative.   Tolerating Diet:yes this am     DRUG ALLERGIES:   Allergies  Allergen Reactions  . Amoxicillin-Pot Clavulanate Nausea And Vomiting  . Hydromorphone Hcl Other (See Comments)    Reaction:  Unknown  . Rifampin Nausea And Vomiting    VITALS:  Blood pressure 169/85, pulse 88, temperature 97.4 F (36.3 C), temperature source Tympanic, resp. rate 20, height 6' (1.829 m), weight 99.338 kg (219 lb), SpO2 100 %.  PHYSICAL EXAMINATION:   Physical Exam  Constitutional: He is well-developed, well-nourished, and in no distress.  HENT:  Head: Normocephalic and atraumatic.  Eyes: Pupils are equal, round, and reactive to light.  Neck: Normal range of motion. Neck supple. No tracheal deviation present.  Cardiovascular: Normal rate.   Pulmonary/Chest: No respiratory distress. He has no wheezes. He has no rales.  Abdominal: Soft. Bowel sounds are normal. He exhibits no distension. There is no rebound and no guarding.  Genitourinary:  Has SP cath   Musculoskeletal: Normal range of motion. He exhibits edema.  Amputation of toes   Neurological: He is alert. No cranial nerve deficit.  Skin: Skin is warm and dry.  Psychiatric: Affect  normal.      LABORATORY PANEL:   CBC  Recent Labs Lab 03/16/15 0920  WBC 5.1  HGB 10.0*  HCT 32.1*  PLT 132*   ------------------------------------------------------------------------------------------------------------------  Chemistries   Recent Labs Lab 03/16/15 0921  NA 129*  K 4.2  CL 93*  CO2 25  GLUCOSE 324*  BUN 28*  CREATININE 3.76*  CALCIUM 7.9*   ------------------------------------------------------------------------------------------------------------------  Cardiac Enzymes  Recent Labs Lab 03/13/15 1544  TROPONINI 0.03   ------------------------------------------------------------------------------------------------------------------  RADIOLOGY:  No results found.   ASSESSMENT AND PLAN:   44 y/o Male with hx of diabetes and recent scrotal abscess presented with hematemesis.   1.Hematemesis: Hemoglobin is stable and has been stable since admission. He did not have any episodes of hematemesis since admission. s/p EGD this am and showed LA Grade C reflux esophagitis. continue on protonix.   2. History of scrotal abscess: Patient was recently discharged on Keflex and Augmentin as well as Diflucan. I have changed him back to hi PO medications. I also spoke with UROLOGY. Patient was seen last Monday by Dr. Alvan Dame and she says he needs to follow up Dr. Franchot Erichsen who performed original penile implant. Patient will have an appointment made prior to discharge. ID consultation appreciated. Continue current antibiotics.  3. History of end-stage renal disease: Nephrology was consulted for hemodialysis.His regular schedule is Tuesday/thur/sat  4. Type 1 diabetes: Patient is on Levemir 7 units in am and14 units at home. I appreciate diabetes consultation. He is on sliding scale insulin. His blood sugars are fluctuating. I will continue current management with SSI.   5. Hypertension essential: I have restarted outpatient  medications to better control his  blood pressure.   6. Hypothyroidism He is on Synthroid.    All the records are reviewed and case discussed with patient  CODE STATUS: LIMITED  TOTAL TIME TAKING CARE OF THIS PATIENT: 27 minutes.   Plan for HD in am tomorrow then likely d/c back to facility.    [facility], Ceonna Frazzini M.D on 03/18/2015 at 3:32 PM  Between 7am to 6pm - Pager - [phone] After 6pm go to [email or web address] - password [email or web address]  [facility]  Office  [phone]  CC: Primary care physician; [name], MD

## 2015-03-18 NOTE — Op Note (Signed)
[facility] Gastroenterology Patient Name: Taylor [name] Procedure Date: 03/18/2015 11:32 AM MRN: [id number] Account #: [id number] Date of Birth: 1971-03-10 Admit Type: Inpatient Age: 44 Room: [facility] ENDO ROOM 1 Gender: Male Note Status: Finalized Procedure:         Upper GI endoscopy Indications:       Hematemesis Providers:         [name], MD Medicines:         Propofol per Anesthesia Complications:     No immediate complications. Procedure:         Pre-Anesthesia Assessment:                    - After reviewing the risks and benefits, the patient was                     deemed in satisfactory condition to undergo the procedure.                    After obtaining informed consent, the endoscope was passed                     under direct vision. Throughout the procedure, the                     patient's blood pressure, pulse, and oxygen saturations                     were monitored continuously. The Olympus GIF-160 endoscope                     (S#. [id number]) was introduced through the mouth, and                     advanced to the prepyloric region, stomach. The upper GI                     endoscopy was accomplished without difficulty. The patient                     tolerated the procedure well. Findings:      LA Grade C (one or more mucosal breaks continuous between tops of 2 or       more mucosal folds, less than 75% circumference) esophagitis with no       bleeding was found.      A large amount of food (residue) was found in the gastric body and in       the gastric antrum. Fluid volume of about 400cc was suctioned. A       significant amount of residual food and fluid was remaining in the       stomach. The scope was not passed into the duodenum but he pylorus was       open. Impression:        - LA Grade C reflux esophagitis.                    - A large amount of food (residue) in the stomach.                    - No specimens  collected. Recommendation:    - The findings and recommendations were discussed with the                     patient.  [name], MD 03/18/2015 12:06:39 PM This report has been signed electronically. Number of Addenda: 0 Note Initiated On: 03/18/2015 11:32 AM      [facility]

## 2015-03-18 NOTE — Anesthesia Postprocedure Evaluation (Signed)
  Anesthesia Post-op Note  Patient: Taylor [name]  Procedure(s) Performed: Procedure(s): ESOPHAGOGASTRODUODENOSCOPY (EGD) (N/A)  Anesthesia type:General  Patient location: PACU  Post pain: Pain level controlled  Post assessment: Post-op Vital signs reviewed, Patient's Cardiovascular Status Stable, Respiratory Function Stable, Patent Airway and No signs of Nausea or vomiting  Post vital signs: Reviewed and stable  Last Vitals:  Filed Vitals:   03/18/15 1246  BP: 139/78  Pulse: 80  Temp:   Resp: 16    Level of consciousness: awake, alert  and patient cooperative  Complications: No apparent anesthesia complications

## 2015-03-18 NOTE — Addendum Note (Signed)
Addendum  created 03/18/15 1320 by Yevette Edwards, MD   Modules edited: Notes Section   Notes Section:  File: [id number]

## 2015-03-18 NOTE — Anesthesia Preprocedure Evaluation (Addendum)
Anesthesia Evaluation  Patient identified by MRN, date of birth, ID band Patient awake    Reviewed: Allergy & Precautions, NPO status , Patient's Chart, lab work & pertinent test results  Airway Mallampati: III       Dental   Pulmonary neg pulmonary ROS, shortness of breath, pneumonia -,          Cardiovascular hypertension, + angina + CAD, + Past MI, + Peripheral Vascular Disease and +CHF negative cardio ROS  Rhythm:Regular     Neuro/Psych PSYCHIATRIC DISORDERS Anxiety Depression  Neuromuscular disease negative neurological ROS  negative psych ROS   GI/Hepatic negative GI ROS, Neg liver ROS, hiatal hernia, GERD-  ,  Endo/Other  negative endocrine ROSdiabetesHypothyroidism   Renal/GU Renal diseasenegative Renal ROS  negative genitourinary   Musculoskeletal negative musculoskeletal ROS (+) Arthritis -,   Abdominal   Peds negative pediatric ROS (+)  Hematology negative hematology ROS (+) anemia ,   Anesthesia Other Findings   Reproductive/Obstetrics negative OB ROS                            Anesthesia Physical Anesthesia Plan  ASA: III  Anesthesia Plan: General   Post-op Pain Management:    Induction: Intravenous  Airway Management Planned:   Additional Equipment:   Intra-op Plan:   Post-operative Plan:   Informed Consent: I have reviewed the patients History and Physical, chart, labs and discussed the procedure including the risks, benefits and alternatives for the proposed anesthesia with the patient or authorized representative who has indicated his/her understanding and acceptance.   Dental advisory given  Plan Discussed with:   Anesthesia Plan Comments:         Anesthesia Quick Evaluation

## 2015-03-19 LAB — BASIC METABOLIC PANEL
ANION GAP: 9 (ref 5–15)
BUN: 34 mg/dL — ABNORMAL HIGH (ref 6–20)
CHLORIDE: 101 mmol/L (ref 101–111)
CO2: 26 mmol/L (ref 22–32)
Calcium: 8.3 mg/dL — ABNORMAL LOW (ref 8.9–10.3)
Creatinine, Ser: 4.07 mg/dL — ABNORMAL HIGH (ref 0.61–1.24)
GFR calc non Af Amer: 16 mL/min — ABNORMAL LOW (ref >60)
GFR, EST AFRICAN AMERICAN: 19 mL/min — AB (ref >60)
Glucose, Bld: 237 mg/dL — ABNORMAL HIGH (ref 65–99)
Potassium: 4.5 mmol/L (ref 3.5–5.1)
Sodium: 136 mmol/L (ref 135–145)

## 2015-03-19 LAB — GLUCOSE, CAPILLARY
Glucose-Capillary: 216 mg/dL — ABNORMAL HIGH (ref 70–99)
Glucose-Capillary: 274 mg/dL — ABNORMAL HIGH (ref 70–99)
Glucose-Capillary: 97 mg/dL (ref 70–99)
Glucose-Capillary: 164 mg/dL — ABNORMAL HIGH (ref 70–99)

## 2015-03-19 LAB — CBC
HCT: 30 % — ABNORMAL LOW (ref 40.0–52.0)
HEMOGLOBIN: 9.4 g/dL — AB (ref 13.0–18.0)
MCH: 28.7 pg (ref 26.0–34.0)
MCHC: 31.3 g/dL — ABNORMAL LOW (ref 32.0–36.0)
MCV: 91.9 fL (ref 80.0–100.0)
Platelets: 148 10*3/uL — ABNORMAL LOW (ref 150–440)
RBC: 3.26 MIL/uL — ABNORMAL LOW (ref 4.40–5.90)
RDW: 17.4 % — ABNORMAL HIGH (ref 11.5–14.5)
WBC: 7.1 10*3/uL (ref 3.8–10.6)

## 2015-03-19 LAB — STOOL CULTURE
Culture: NORMAL
Special Requests: NORMAL

## 2015-03-19 LAB — ABO/RH: ABO/RH(D): O POS

## 2015-03-19 MED ORDER — ALPRAZOLAM 0.25 MG PO TABS: 0.2500 mg | ORAL_TABLET | Freq: Four times a day (QID) | ORAL | Status: DC | PRN

## 2015-03-19 MED ORDER — OXYCODONE HCL 5 MG PO TABS: 5.0000 mg | ORAL_TABLET | ORAL | Status: DC | PRN

## 2015-03-19 MED ORDER — METOCLOPRAMIDE HCL 5 MG PO TABS
5.0000 mg | ORAL_TABLET | Freq: Three times a day (TID) | ORAL | Status: DC
  Administered 2015-03-19 (×2): 5 mg via ORAL
  Filled 2015-03-19 (×5): qty 1

## 2015-03-19 NOTE — Progress Notes (Signed)
Subjective:   Patient seen during dialysis Tolerating well  EGD report reviewed    Objective:  Vital signs in last 24 hours:  Temp:  [97.1 F (36.2 C)-97.9 F (36.6 C)] 97.9 F (36.6 C) (05/10 0440) Pulse Rate:  [74-88] 83 (05/10 0440) Resp:  [15-20] 20 (05/10 0440) BP: (111-171)/(59-85) 138/74 mmHg (05/10 0440) SpO2:  [98 %-100 %] 100 % (05/10 0440) Weight:  [98.884 kg (218 lb)] 98.884 kg (218 lb) (05/10 0500)  Weight change: -0.454 kg (-1 lb) Filed Weights   03/18/15 0438 03/19/15 0440 03/19/15 0500  Weight: 99.338 kg (219 lb) 98.884 kg (218 lb) 98.884 kg (218 lb)    Intake/Output: I/O last 3 completed shifts: In: 105 [I.V.:105] Out: 400 [Urine:400]   Intake/Output this shift:     General: Not in acute distress Head: [place]/AT, poor dentition, moist oral mucosal membranes Neck: supple, trachea midline CVS: regular rate and rhythm, no murmurs Resp: clear to auscultation bilaterally ABD: soft, nontender EXT: 2+ bilateral lower extremity edema Neuro: nonfocal. GU: scrotal edema Access: left arm AVF   Basic Metabolic Panel:  Recent Labs Lab 03/13/15 1544 03/14/15 0202 03/14/15 0844 03/16/15 0921 03/19/15 0527  NA 136 133* 133* 129* 136  K 4.7 4.6 4.9 4.2 4.5  CL 96* 94* 93* 93* 101  CO2 31 29 28 25 26   GLUCOSE 197* 176* 224* 324* 237*  BUN 29* 30* 32* 28* 34*  CREATININE 3.31* 3.47* 3.68* 3.76* 4.07*  CALCIUM 8.8* 8.4* 8.5* 7.9* 8.3*  PHOS  --   --  3.4 3.4  --     Liver Function Tests:  Recent Labs Lab 03/14/15 0844 03/16/15 0921  ALBUMIN 2.3* 2.4*   No results for input(s): LIPASE, AMYLASE in the last 168 hours. No results for input(s): AMMONIA in the last 168 hours.  CBC:  Recent Labs Lab 03/14/15 0202 03/14/15 0848 03/15/15 0634 03/16/15 0920 03/19/15 0527  WBC 5.4 5.4 5.4 5.1 7.1  HGB 9.9* 10.0* 9.9* 10.0* 9.4*  HCT 31.4* 32.0* 31.5* 32.1* 30.0*  MCV 92.4 92.4 92.0 91.9 91.9  PLT 101* 98* 123* 132* 148*    Cardiac  Enzymes:  Recent Labs Lab 03/13/15 1544  TROPONINI 0.03    BNP: Invalid input(s): POCBNP  CBG:  Recent Labs Lab 03/18/15 1640 03/18/15 2140 03/18/15 2253 03/19/15 0520 03/19/15 0725  GLUCAP 294* 281* 257* 216* 274*    Microbiology: Results for orders placed or performed during the hospital encounter of 03/13/15  Stool culture     Status: None   Collection Time: 03/14/15  9:26 PM  Result Value Ref Range Status   Specimen Description STOOL  Final   Special Requests Normal  Final   Culture   Final    REDUCED NORMAL FECAL FLORA NO SALMONELLA OR SHIGELLA ISOLATED    Report Status 03/17/2015 FINAL  Final    Coagulation Studies: No results for input(s): LABPROT, INR in the last 72 hours.  Urinalysis: No results for input(s): COLORURINE, LABSPEC, PHURINE, GLUCOSEU, HGBUR, BILIRUBINUR, KETONESUR, PROTEINUR, UROBILINOGEN, NITRITE, LEUKOCYTESUR in the last 72 hours.  Invalid input(s): APPERANCEUR    Imaging: No results found.   Medications:     . amitriptyline  25 mg Oral QHS  . amoxicillin-clavulanate  1 tablet Oral QHS  . carvedilol  25 mg Oral BID WC  . cephALEXin  250 mg Oral 2 times per day  . cloNIDine  0.1 mg Oral BID  . fluconazole  100 mg Oral Daily  . furosemide  80 mg  Oral BID  . insulin aspart  0-15 Units Subcutaneous TID WC  . insulin detemir  7 Units Subcutaneous Daily  . isosorbide mononitrate  30 mg Oral BID  . levothyroxine  75 mcg Oral QAC breakfast  . lisinopril  10 mg Oral Daily  . pantoprazole  40 mg Oral BID  . PARoxetine  20 mg Oral Daily  . rosuvastatin  20 mg Oral QHS  . sodium chloride  3 mL Intravenous Q6H   acetaminophen, calcium carbonate, diphenoxylate-atropine, hydrALAZINE, LORazepam, metoCLOPramide, ondansetron (ZOFRAN) IV, oxyCODONE, sodium chloride  Assessment/ Plan:  44 y.o. 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 TTS  1. ESRD on HD: Patient seen during dialysis Tolerating well     2. Anemia of CKD: continue epogen with HD.   - admitted with GI bleed. No more episodes. Endoscopy as per GI.  - LA Grade C (one or more mucosal breaks continuous between tops of 2 or   more mucosal folds, less than 75% circumference) esophagitis with no   bleeding was found.  A large amount of food (residue) was found in the gastric body and in   the gastric antrum. Fluid volume of about 400cc was suctioned. A   significant amount of residual food and fluid was remaining in the   stomach. The scope was not passed into the duodenum but he pylorus was   open.  - consider reglan before meals  3. SHPTH: last phos 3.4, pt not on binders at the moment.   LOS: 6 Taylor [name] 5/10/201610:01 AM

## 2015-03-19 NOTE — Progress Notes (Signed)
Post hd tx 

## 2015-03-19 NOTE — Clinical Social Work Note (Signed)
Pt is ready for discharge today to [facility]. Pt is aware and agreeable to discharge plan. Facility has received discharge summary and is ready to admit pt. RN will call report and EMS will provide transportation. CSW is signing off as no further needs identified.   Dede Query, MSW, LCSW Clinical Social Worker [phone]

## 2015-03-19 NOTE — Progress Notes (Signed)
Pre-hd tx 

## 2015-03-19 NOTE — Progress Notes (Signed)
HD tx start 

## 2015-03-19 NOTE — Progress Notes (Signed)
Received MS order to discharge patient to [facility] , PICC Line removed, suprapubic cath remains in, report called to [facility]  RN

## 2015-03-19 NOTE — Progress Notes (Signed)
HD tx completed.

## 2015-03-19 NOTE — Plan of Care (Signed)
Problem: Discharge Progression Outcomes Goal: Other Discharge Outcomes/Goals Outcome: Progressing Plan of care progress to goal for:  Pain-prn meds given x1 with improvement Hemodynamically-pt has good urine output Diet-pt tolerating pt well Activity-pt did not get up this shift

## 2015-03-19 NOTE — Discharge Summary (Signed)
[facility] - [place] at [facility]   PATIENT NAME: Taylor [name]    MR#:  [id number]  DATE OF BIRTH:  1971-01-23  DATE OF ADMISSION:  03/13/2015 ADMITTING PHYSICIAN: Ramonita Lab, MD  DATE OF DISCHARGE: 03/19/2015  PRIMARY CARE PHYSICIAN: [name], MD    ADMISSION DIAGNOSIS:  Coffee ground emesis [K92.0]  DISCHARGE DIAGNOSIS:  Hematemesis Scrotal abscess and edema  SECONDARY DIAGNOSIS:   Past Medical History  Diagnosis Date  . End stage renal disease on dialysis     LUE fistula  . Type I diabetes mellitus     a. 03/2014 admitted with HNK to [facility].  . Diabetic neuropathy     severe, s/p multiple toe amputation  . Hypothyroidism   . Hypertension   . Hyperlipidemia   . H/O hiatal hernia   . GERD (gastroesophageal reflux disease)   . Anxiety   . Sebaceous cyst     side of neck  . Pneumonia     2010  . Anemia     a. req PRBC's 2011.  [name] PAD (peripheral artery disease)     a. s/p amputation of toes on the right;  b. left LE claudication.  . Coronary artery disease     a. s/p MI;  b. 10/2009 CABG x 3 @ [facility]: LIMA->LAD, VG->OM3, VG->RPDA; c. 11/2010 Cath 3/3 patent grafts;  d 12/2012 Cath: LM 30d, LAD 85p, D1 70, D2 90, LCX 40ost, OM2 100, RCA 90p, 152m, L->LAD ok, VG->OM3 ok, VG->RPDA 30, EF 50%->Med Rx.  . Cataract     right  . Valvular disease     a. 11/2012 Echo: EF 55-60%, mild LVH, mild MR, mild bi-atrial enlargement, mild-mod TR, PASP .  . CHF (congestive heart failure)   . Depression   . Arthritis     rheumatoid arthritis   . Hx of transfusion of packed red blood cells   . Myocardial infarction 2010  . Anginal pain     HOSPITAL COURSE:   Patient is a 44 year old male with a known history of diabetes and recent history of scrotal abscess being treated by antibiotics. He was admitted for hematemesis. Please see history and physical dictated on admission for further details. He did not have any further hematemesis since  admission did undergo an endoscopy which showed esophagitis. She was continued on Protonix twice a day and has been doing well. He is agreeable to go back to his rehabilitation facility. Patient was evaluated for his scrotal edema and was requested to go see his primary urologist in [place]. He is in agreement for same.  CONSULTS OBTAINED:  Treatment Team:  [name], MD Clydie Braun, MD Lamont Dowdy, MD Christena Deem, MD  DRUG ALLERGIES:   Allergies  Allergen Reactions  . Amoxicillin-Pot Clavulanate Nausea And Vomiting  . Hydromorphone Hcl Other (See Comments)    Reaction:  Unknown  . Rifampin Nausea And Vomiting    DISCHARGE MEDICATIONS:   Current Discharge Medication List    CONTINUE these medications which have NOT CHANGED   Details  acetaminophen (TYLENOL) 500 MG tablet Take 1,000 mg by mouth every 6 (six) hours as needed for mild pain.     ALPRAZolam (XANAX) 0.25 MG tablet Take 0.25 mg by mouth every 6 (six) hours as needed for anxiety.    alum & mag hydroxide-simeth (MAALOX PLUS) 400-400-40 MG/5ML suspension Take 30 mLs by mouth every 4 (four) hours as needed for indigestion.     amitriptyline (ELAVIL) 25 MG  tablet Take 1 tablet (25 mg total) by mouth at bedtime. Qty: 30 tablet, Refills: 1    amoxicillin-clavulanate (AUGMENTIN) 500-125 MG per tablet Take 1 tablet (500 mg total) by mouth at bedtime. Qty: 14 tablet, Refills: 0    aspirin 81 MG EC tablet Take 81 mg by mouth daily. Swallow whole.    calcium carbonate (TUMS - DOSED IN MG ELEMENTAL CALCIUM) 500 MG chewable tablet Chew 1-2 tablets (200-400 mg of elemental calcium total) by mouth 2 (two) times daily as needed for indigestion or heartburn. Qty: 1 tablet, Refills: 0    carvedilol (COREG) 25 MG tablet Take 1 tablet (25 mg total) by mouth 2 (two) times daily with a meal. Qty: 90 tablet, Refills: 0    cephALEXin (KEFLEX) 250 MG capsule Take 1 capsule (250 mg total) by mouth every 12 (twelve)  hours. Qty: 14 capsule, Refills: 0    cinacalcet (SENSIPAR) 60 MG tablet Take 60 mg by mouth daily.     cloNIDine (CATAPRES) 0.1 MG tablet Take 0.1 mg by mouth 2 (two) times daily.    fluconazole (DIFLUCAN) 100 MG tablet Take 1 tablet (100 mg total) by mouth daily. Qty: 7 tablet, Refills: 0    furosemide (LASIX) 80 MG tablet Take 80 mg by mouth 2 (two) times daily.    HYDROcodone-acetaminophen (NORCO/VICODIN) 5-325 MG per tablet Take 1 tablet by mouth every 6 (six) hours as needed for moderate pain. Qty: 15 tablet, Refills: 0    !! insulin aspart (NOVOLOG) 100 UNIT/ML injection Inject 4 Units into the skin 3 (three) times daily with meals. Qty: 10 mL, Refills: 0    !! insulin aspart (NOVOLOG) 100 UNIT/ML injection Inject 0-7 Units into the skin 4 (four) times daily -  before meals and at bedtime. Qty: 10 mL, Refills: 11    insulin detemir (LEVEMIR) 100 UNIT/ML injection Inject 0.14 mLs (14 Units total) into the skin at bedtime. Qty: 10 mL, Refills: 11    isosorbide mononitrate (IMDUR) 30 MG 24 hr tablet Take 30 mg by mouth 2 (two) times daily.    levothyroxine (SYNTHROID, LEVOTHROID) 75 MCG tablet Take 1 tablet (75 mcg total) by mouth daily before breakfast. Qty: 90 tablet, Refills: 1    lisinopril (PRINIVIL,ZESTRIL) 10 MG tablet Take 10 mg by mouth daily.    Omega-3 Fatty Acids (FISH OIL) 1000 MG CAPS Take 1 capsule by mouth daily.    ondansetron (ZOFRAN ODT) 4 MG disintegrating tablet Take 1 tablet (4 mg total) by mouth every 8 (eight) hours as needed for nausea or vomiting. Qty: 20 tablet, Refills: 0    pantoprazole (PROTONIX) 40 MG tablet Take 40 mg by mouth 2 (two) times daily.    PARoxetine (PAXIL) 20 MG tablet Take 20 mg by mouth daily.     promethazine (PHENERGAN) 25 MG tablet Take 1 tablet (25 mg total) by mouth every 6 (six) hours as needed for nausea or vomiting. Qty: 30 tablet, Refills: 0    rosuvastatin (CRESTOR) 20 MG tablet Take 1 tablet (20 mg total) by  mouth at bedtime. Qty: 30 tablet, Refills: 5    Nutritional Supplements (FEEDING SUPPLEMENT, NEPRO CARB STEADY,) LIQD Take 237 mLs by mouth as needed (missed meal during dialysis.). Qty: 30 Can, Refills: 0     !! - Potential duplicate medications found. Please discuss with provider.       DISCHARGE INSTRUCTIONS:    DIET:  Cardiac diet  DISCHARGE CONDITION:  Fair  ACTIVITY:  Activity as tolerated  OXYGEN:  Home Oxygen: No.   Oxygen Delivery: room air  DISCHARGE LOCATION:  nursing home   If you experience worsening of your admission symptoms, develop shortness of breath, life threatening emergency, suicidal or homicidal thoughts you must seek medical attention immediately by calling 911 or calling your MD immediately  if symptoms less severe.  You Must read complete instructions/literature along with all the possible adverse reactions/side effects for all the Medicines you take and that have been prescribed to you. Take any new Medicines after you have completely understood and accpet all the possible adverse reactions/side effects.   Today    VITAL SIGNS:  Blood pressure 151/70, pulse 78, temperature 97.5 F (36.4 C), temperature source Oral, resp. rate 18, height 6' (1.829 m), weight 104.8 kg (231 lb 0.7 oz), SpO2 98 %.  I/O:   Intake/Output Summary (Last 24 hours) at 03/19/15 1417 Last data filed at 03/19/15 1305  Gross per 24 hour  Intake      5 ml  Output   2400 ml  Net  -2395 ml    PHYSICAL EXAMINATION:  GENERAL:  44 y.o.-year-old patient lying in the bed with no acute distress.  EYES: Pupils equal, round, reactive to light and accommodation. No scleral icterus. Extraocular muscles intact.  HEENT: Head atraumatic, normocephalic. Oropharynx and nasopharynx clear.  NECK:  Supple, no jugular venous distention. No thyroid enlargement, no tenderness.  LUNGS: Normal breath sounds bilaterally, no wheezing, rales,rhonchi or crepitation. No use of accessory  muscles of respiration.  CARDIOVASCULAR: S1, S2 normal. No murmurs, rubs, or gallops.  ABDOMEN: Soft, non-tender, non-distended. Bowel sounds present. No organomegaly or mass.  EXTREMITIES: No pedal edema, cyanosis, or clubbing.  NEUROLOGIC: Cranial nerves II through XII are intact. Muscle strength 5/5 in all extremities. Sensation intact. Gait not checked.  PSYCHIATRIC: The patient is alert and oriented x 3.  SKIN: No obvious rash, lesion, or ulcer.   DATA REVIEW:   CBC  Recent Labs Lab 03/19/15 0527  WBC 7.1  HGB 9.4*  HCT 30.0*  PLT 148*    Chemistries   Recent Labs Lab 03/19/15 0527  NA 136  K 4.5  CL 101  CO2 26  GLUCOSE 237*  BUN 34*  CREATININE 4.07*  CALCIUM 8.3*    Cardiac Enzymes  Recent Labs Lab 03/13/15 1544  TROPONINI 0.03    Microbiology Results  Results for orders placed or performed during the hospital encounter of 03/13/15  Stool culture     Status: None   Collection Time: 03/14/15  9:26 PM  Result Value Ref Range Status   Specimen Description STOOL  Final   Special Requests Normal  Final   Culture   Final    REDUCED NORMAL FECAL FLORA NO SALMONELLA OR SHIGELLA ISOLATED No Pathogenic E. coli detected NO CAMPYLOBACTER DETECTED    Report Status 03/19/2015 FINAL  Final    Management plans discussed with the patient and he is in agreement.  CODE STATUS:     Code Status Orders        Start     Ordered   03/13/15 2336  Limited resuscitation (code)   Continuous    Question Answer Comment  In the event of cardiac or respiratory ARREST: Initiate Code Blue, Call Rapid Response No   In the event of cardiac or respiratory ARREST: Perform CPR No   In the event of cardiac or respiratory ARREST: Perform Intubation/Mechanical Ventilation No   In the event of cardiac or respiratory ARREST: Use NIPPV/BiPAp only if  indicated No   In the event of cardiac or respiratory ARREST: Administer ACLS medications if indicated Yes   In the event of  cardiac or respiratory ARREST: Perform Defibrillation or Cardioversion if indicated No   Comments please see MOST form      03/13/15 2335      TOTAL TIME TAKING CARE OF THIS PATIENT: 35 minutes.    Please note  You were cared for by a hospitalist during your hospital stay. If you have any questions about your discharge medications or the care you received while you were in the hospital after you are discharged, you can call the unit and asked to speak with the hospitalist on call if the hospitalist that took care of you is not available. Once you are discharged, your primary care physician will handle any further medical issues. Please note that NO REFILLS for any discharge medications will be authorized once you are discharged, as it is imperative that you return to your primary care physician (or establish a relationship with a primary care physician if you do not have one) for your aftercare needs so that they can reassess your need for medications and monitor your lab values.   [facility], Andrewjames Weirauch M.D on 03/19/2015 at 2:17 PM  Between 7am to 6pm - Pager - [phone]  After 6pm go to [email or web address] - password [email or web address]  [facility]  Office  [phone]  CC: Primary care physician; [name], MD

## 2015-03-20 ENCOUNTER — Encounter: Payer: Self-pay | Admitting: Unknown Physician Specialty

## 2015-03-27 ENCOUNTER — Other Ambulatory Visit: Payer: Self-pay | Admitting: Internal Medicine

## 2015-04-03 ENCOUNTER — Other Ambulatory Visit: Payer: Self-pay | Admitting: Internal Medicine

## 2015-04-03 ENCOUNTER — Other Ambulatory Visit: Payer: Self-pay | Admitting: Family Medicine

## 2015-04-15 ENCOUNTER — Telehealth: Payer: Self-pay | Admitting: Family Medicine

## 2015-04-15 ENCOUNTER — Encounter: Payer: Medicare Other | Admitting: Family Medicine

## 2015-04-15 DIAGNOSIS — Z0289 Encounter for other administrative examinations: Secondary | ICD-10-CM

## 2015-04-15 NOTE — Telephone Encounter (Signed)
Patient did not come in for their appointment today medicare cpe.  Please let me know if patient needs to be contacted immediately for follow up or no follow up needed.

## 2015-04-16 NOTE — Telephone Encounter (Signed)
Yes he should make an appt.

## 2015-04-17 NOTE — Telephone Encounter (Signed)
Lm for pt to reschedule appt

## 2015-04-22 NOTE — Telephone Encounter (Signed)
L/m for pt to return call

## 2015-05-06 ENCOUNTER — Other Ambulatory Visit: Payer: Self-pay

## 2015-05-07 ENCOUNTER — Telehealth: Payer: Self-pay | Admitting: Urology

## 2015-05-07 NOTE — Telephone Encounter (Signed)
Dr Apolinar Junes requested a referral to [facility] urology for reconstruction consult.  Contacted [facility] and faxed over Dr. Delana Meyer last office note.  Received the fax back stating "per review by Dr Vonita Moss - nothing to offer this patient for treatment.  No appointment scheduled."  I will attempt to contact [facility].

## 2015-05-08 ENCOUNTER — Other Ambulatory Visit: Payer: Self-pay | Admitting: Family Medicine

## 2015-05-10 ENCOUNTER — Encounter: Payer: Self-pay | Admitting: Urology

## 2015-05-10 ENCOUNTER — Ambulatory Visit (INDEPENDENT_AMBULATORY_CARE_PROVIDER_SITE_OTHER): Payer: Medicare Other | Admitting: Urology

## 2015-05-10 ENCOUNTER — Other Ambulatory Visit: Payer: Self-pay | Admitting: Urology

## 2015-05-10 VITALS — BP 121/72 | HR 84 | Temp 98.0°F | Resp 18

## 2015-05-10 DIAGNOSIS — D649 Anemia, unspecified: Secondary | ICD-10-CM | POA: Insufficient documentation

## 2015-05-10 DIAGNOSIS — N4889 Other specified disorders of penis: Secondary | ICD-10-CM

## 2015-05-10 DIAGNOSIS — Z435 Encounter for attention to cystostomy: Secondary | ICD-10-CM | POA: Diagnosis not present

## 2015-05-10 DIAGNOSIS — N360 Urethral fistula: Secondary | ICD-10-CM

## 2015-05-10 MED ORDER — CLINDAMYCIN HCL 300 MG PO CAPS: 300.0000 mg | ORAL_CAPSULE | Freq: Three times a day (TID) | ORAL | Status: DC

## 2015-05-10 NOTE — Progress Notes (Signed)
05/10/2015 12:13 PM   Eustace Quail Dec 02, 1970 [id number]  Referring provider: Dianne Dun, MD [address] [place], [place] [address]  Chief Complaint  Patient presents with  . Follow-up    SP Change    HPI: Mr. Cudworth is a 44 year old white male who presents today for SPT exchange.  Patient has not had any difficulty with the SPT, but he is complaining of a new onset of penile swelling.   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 He returns to the office for outpatient follow-up for SP tube exchange as well as Penrose drain removal.  The patient does have a history of an infected penile prosthesis placed in approximately September 2015 by Dr. Patsi Sears of [facility] in [place]. Unfortunately, the prosthesis became infected within a month of placement and it was subsequently removed. He has been struggling with chronic penile and scrotal abscesses since that time and most recently underwent I and D at [facility] on 02/02/2015 by Dr. Berneice Heinrich at which time abscess cavity and tract were identified, tracking from the left hemiscrotum up the length of the subcutaneous tissues of the penis to the left distal penile shaft. A Penrose drain was placed through this tract, and he was ultimately discharged for wound management to a skilled nursing facility. His cultures from that admission grew Escherichia coli sensitive to cephalosporins, including Keflex and ceftriaxone as well as gentamicin, but resistant to Cipro and Bactrim.   He then presented to the Emergency Room at [facility] on 02/20/15 with sepsis, penoscrotal drainage from a chronically infected penis/scrotum following penile prosthesis infection status post removal. He was taken to the OR during that admission  for exploration, Cystoscopy, and ultimately SP tube placement. The Penrose drain extending down the length of his LEFT penile shaft was replaced and the scrotal abscess was opened further to allow for drainage. Wound culture grew both enterococcus and Escherichia coli and he was started on Keflex as well as Augmen to cover both species. He remained in the hospital for several weeks and was readmitted for various reasons. Just prior to discharge, he was noted to develop worsening penoscrotal edema as well as diffuse anasarca an no evidence of recurrent infection. He was ultimately discharged to rehabilitation.  It was recommended by Dr. Apolinar Junes that the patient seek treatment at a tertiary care center such as [facility] or [facility]. We are currently trying to obtain a referral to either one of the centers. So far, [facility] has refused.  We are waiting for [facility]'s response at this time.  He does not complain of any fevers, chills, nausea, vomiting, penile pain or penile drainage. He finished his Bactrim 2 days prior to his visit with [name].  PMH: Past Medical History  Diagnosis Date  . End stage renal disease on dialysis     LUE fistula  . Type I diabetes mellitus     a. 03/2014 admitted with HNK to [facility].  . Diabetic neuropathy     severe, s/p multiple toe amputation  . Hypothyroidism   . Hypertension   . Hyperlipidemia   . H/O hiatal hernia   . GERD (gastroesophageal reflux disease)   . Anxiety   . Sebaceous cyst     side of neck  . Pneumonia     2010  . Anemia  a. req PRBC's 2011.  [name] PAD (peripheral artery disease)     a. s/p amputation of toes on the right;  b. left LE claudication.  . Coronary artery disease     a. s/p MI;  b. 10/2009 CABG x 3 @ [facility]: LIMA->LAD, VG->OM3, VG->RPDA; c. 11/2010 Cath 3/3 patent grafts;  d 12/2012 Cath: LM 30d, LAD 85p, D1 70, D2 90, LCX 40ost, OM2 100, RCA 90p, 176m, L->LAD ok, VG->OM3 ok, VG->RPDA 30, EF 50%->Med Rx.  . Cataract     right  . Valvular  disease     a. 11/2012 Echo: EF 55-60%, mild LVH, mild MR, mild bi-atrial enlargement, mild-mod TR, PASP .  . CHF (congestive heart failure)   . Depression   . Arthritis     rheumatoid arthritis   . Hx of transfusion of packed red blood cells   . Myocardial infarction 2010  . Anginal pain     Surgical History: Past Surgical History  Procedure Laterality Date  . Dialysis fistula creation      left upper arm fistula  . Amputation      TOES ON BOTH FEET  . Abscess drainage  Behind right ear/occipital scalp  . Skin graft    . Eye surgery  1999  . Coronary artery bypass graft  10/2009    [facility] (Dr. Katrinka Blazing)  . Foot amputation    . Cardiac catheterization    . Cardiac catheterization  2/14    [facility]: severe 3 vessel CAD with patent grafts, RHC: moderately elevated PCW and pulmonary hypertension  . Penile prosthesis implant N/A 07/24/2014    Procedure: SALINE PENILE INJECTION WITH DISSECTION OF CORPORA ,  IMPLANTATION OF COLOPLAST PENILE PROTHESIS INFLATABLE;  Surgeon: [name], MD;  Location: [facility] ORS;  Service: Urology;  Laterality: N/A;  . Removal of penile prosthesis N/A 08/22/2014    Procedure: EXPLANT OF INFECTED  PENILE PROSTHESIS;  Surgeon: [name], MD;  Location: [facility] ORS;  Service: Urology;  Laterality: N/A;  . Irrigation and debridement abscess Bilateral 12/14/2014    Procedure: Bilateral Corporal Irrigation with Cultures and Drainage, Penrose Drain Insertion;  Surgeon: [name], MD;  Location: [facility] OR;  Service: Urology;  Laterality: Bilateral;  . Scrotal exploration N/A 02/03/2015    Procedure: IRRIGATION AND DEBRIDEMENT SCROTAL/PENILE ABSCESS;  Surgeon: [name], MD;  Location: [facility] OR;  Service: Urology;  Laterality: N/A;  . Cystoscopy N/A 02/03/2015    Procedure: CYSTOSCOPY;  Surgeon: [name], MD;  Location: [facility] OR;  Service: Urology;  Laterality: N/A;  . Esophagogastroduodenoscopy N/A 03/18/2015    Procedure: ESOPHAGOGASTRODUODENOSCOPY  (EGD);  Surgeon: [name], MD;  Location: [facility] ENDOSCOPY;  Service: Endoscopy;  Laterality: N/A;    Home Medications:    Medication List       This list is accurate as of: 05/10/15 12:13 PM.  Always use your most recent med list.               acetaminophen 500 MG tablet  Commonly known as:  TYLENOL  Take 1,000 mg by mouth every 6 (six) hours as needed for mild pain.     ALPRAZolam 0.25 MG tablet  Commonly known as:  XANAX  Take 1 tablet (0.25 mg total) by mouth every 6 (six) hours as needed for anxiety.     alum & mag hydroxide-simeth 400-400-40 MG/5ML suspension  Commonly known as:  MAALOX PLUS  Take 30 mLs by mouth every 4 (four) hours as needed for indigestion.  amitriptyline 25 MG tablet  Commonly known as:  ELAVIL  Take 1 tablet (25 mg total) by mouth at bedtime.     amoxicillin-clavulanate 500-125 MG per tablet  Commonly known as:  AUGMENTIN  Take 1 tablet (500 mg total) by mouth at bedtime.     aspirin 81 MG EC tablet  Take 81 mg by mouth daily. Swallow whole.     calcium carbonate 500 MG chewable tablet  Commonly known as:  TUMS - dosed in mg elemental calcium  Chew 1-2 tablets (200-400 mg of elemental calcium total) by mouth 2 (two) times daily as needed for indigestion or heartburn.     carvedilol 25 MG tablet  Commonly known as:  COREG  Take 1 tablet (25 mg total) by mouth 2 (two) times daily with a meal.     cephALEXin 250 MG capsule  Commonly known as:  KEFLEX  Take 1 capsule (250 mg total) by mouth every 12 (twelve) hours.     cinacalcet 60 MG tablet  Commonly known as:  SENSIPAR  Take 60 mg by mouth daily.     cloNIDine 0.1 MG tablet  Commonly known as:  CATAPRES  Take 0.1 mg by mouth 2 (two) times daily.     feeding supplement (NEPRO CARB STEADY) Liqd  Take 237 mLs by mouth as needed (missed meal during dialysis.).     Fish Oil 1000 MG Caps  Take 1 capsule by mouth daily.     fluconazole 100 MG tablet  Commonly known as:   DIFLUCAN  Take 1 tablet (100 mg total) by mouth daily.     furosemide 80 MG tablet  Commonly known as:  LASIX  Take 80 mg by mouth 2 (two) times daily.     HUMALOG KWIKPEN 100 UNIT/ML KiwkPen  Generic drug:  insulin lispro  INJECT 10-12 UNITS UNDER   THE SKIN 3 TIMES DAILY WITHMEALS     HYDROcodone-acetaminophen 5-325 MG per tablet  Commonly known as:  NORCO/VICODIN  Take 1 tablet by mouth every 6 (six) hours as needed for moderate pain.     insulin aspart 100 UNIT/ML injection  Commonly known as:  novoLOG  Inject 4 Units into the skin 3 (three) times daily with meals.     insulin aspart 100 UNIT/ML injection  Commonly known as:  novoLOG  Inject 0-7 Units into the skin 4 (four) times daily -  before meals and at bedtime.     insulin detemir 100 UNIT/ML injection  Commonly known as:  LEVEMIR  Inject 0.14 mLs (14 Units total) into the skin at bedtime.     Insulin Syringe-Needle U-100 31G X 5/16" 0.5 ML Misc  Commonly known as:  B-D INS SYRINGE 0.5CC/31GX5/16  USE THREE TIMES DAILY     isosorbide mononitrate 30 MG 24 hr tablet  Commonly known as:  IMDUR  Take 30 mg by mouth 2 (two) times daily.     levothyroxine 75 MCG tablet  Commonly known as:  SYNTHROID, LEVOTHROID  Take 1 tablet (75 mcg total) by mouth daily before breakfast.     lisinopril 10 MG tablet  Commonly known as:  PRINIVIL,ZESTRIL  Take 10 mg by mouth daily.     NEURONTIN 300 MG capsule  Generic drug:  gabapentin  TAKE 1 BY MOUTH DAILY AND  1 BY MOUTH 4 HOURS AFTER   DIALYSIS     ondansetron 4 MG disintegrating tablet  Commonly known as:  ZOFRAN ODT  Take 1 tablet (4 mg total) by  mouth every 8 (eight) hours as needed for nausea or vomiting.     oxyCODONE 5 MG immediate release tablet  Commonly known as:  Oxy IR/ROXICODONE  Take 1 tablet (5 mg total) by mouth every 4 (four) hours as needed for moderate pain.     pantoprazole 40 MG tablet  Commonly known as:  PROTONIX  Take 40 mg by mouth 2 (two)  times daily.     PARoxetine 20 MG tablet  Commonly known as:  PAXIL  Take 20 mg by mouth daily.     PRILOSEC 40 MG capsule  Generic drug:  omeprazole  TAKE 1 BY MOUTH DAILY     promethazine 25 MG tablet  Commonly known as:  PHENERGAN  Take 1 tablet (25 mg total) by mouth every 6 (six) hours as needed for nausea or vomiting.     rosuvastatin 20 MG tablet  Commonly known as:  CRESTOR  Take 1 tablet (20 mg total) by mouth at bedtime.        Allergies:  Allergies  Allergen Reactions  . Amoxicillin-Pot Clavulanate Nausea And Vomiting  . Hydromorphone Hcl Other (See Comments)    Reaction:  Unknown  . Rifampin Nausea And Vomiting    Family History: Family History  Problem Relation Age of Onset  . Heart disease Other   . Hypertension Other   . Hypertension Mother   . Heart disease Mother   . Diabetes Mother   . Birth defects Paternal Uncle     unaware  . Birth defects Paternal Grandmother     breast    Social History:  reports that he has never smoked. He quit smokeless tobacco use about 19 years ago. His smokeless tobacco use included Chew. He reports that he drinks alcohol. He reports that he does not use illicit drugs.  ROS: Urological Symptom Review  Patient is experiencing the following symptoms: SPT    Review of Systems  Gastrointestinal (upper)  : Negative for upper GI symptoms  Gastrointestinal (lower) : Diarrhea  Constitutional : Negative for symptoms  Skin: Negative for skin symptoms  Eyes: Negative for eye symptoms  Ear/Nose/Throat : Negative for Ear/Nose/Throat symptoms  Hematologic/Lymphatic: Negative for Hematologic/Lymphatic symptoms  Cardiovascular : Negative for cardiovascular symptoms  Respiratory : Negative for respiratory symptoms  Endocrine: Negative for endocrine symptoms  Musculoskeletal: Negative for musculoskeletal symptoms  Neurological: Negative for neurological symptoms  Psychologic: Negative for  psychiatric symptoms   Physical Exam: BP 121/72 mmHg  Pulse 84  Resp 18  GU: Patient with circumcised phallus. Hypospadias to the coronal margin. Urethral meatus is patent.  Penis is firm but nontender.  No erythema is noted.  Penis is not tender when squeezed. A scant watery pus drainage is expressed.   Scrotum without lesions, cysts, rashes and/or edema.  Testicles are located scrotally bilaterally. No masses are appreciated in the testicles. Left and right epididymis are normal.  Laboratory Data:  Lab Results  Component Value Date   WBC 7.1 03/19/2015   HGB 9.4* 03/19/2015   HCT 30.0* 03/19/2015   MCV 91.9 03/19/2015   PLT 148* 03/19/2015    Lab Results  Component Value Date   CREATININE 4.07* 03/19/2015    No results found for: PSA  No results found for: TESTOSTERONE  Lab Results  Component Value Date   HGBA1C 15.4* 12/10/2014    Urinalysis    Component Value Date/Time   COLORURINE AMBER* 02/02/2015 0357   APPEARANCEUR TURBID* 02/02/2015 0357   LABSPEC 1.021 02/02/2015 0357  PHURINE 6.0 02/02/2015 0357   GLUCOSEU NEGATIVE 02/02/2015 0357   HGBUR LARGE* 02/02/2015 0357   BILIRUBINUR SMALL* 02/02/2015 0357   KETONESUR 40* 02/02/2015 0357   PROTEINUR >300* 02/02/2015 0357   UROBILINOGEN 0.2 02/02/2015 0357   NITRITE NEGATIVE 02/02/2015 0357   LEUKOCYTESUR LARGE* 02/02/2015 0357    Pertinent Imaging:   Assessment & Plan:    1.  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 Ronne Binning . He suggested to start clindamycin and have the patient report to the emergency room if he should develop fevers, chills, nausea, vomiting or increased penile drainage or swelling. I will touch base with Dr. Apolinar Junes next week about the patient's current condition.   2. SPT exchange:   Tube exchanged without difficulty to the patient. It will be exchanged again in 1 month.   There are no diagnoses linked to this encounter.  No Follow-up on file.  Michiel Cowboy, PA-C  [facility] [address] [place], [place] [address] [phone]

## 2015-05-10 NOTE — Progress Notes (Signed)
Suprapubic Cath Change  Patient is present today for a suprapubic catheter change due to urinary retention.  23ml of water was drained from the balloon, a 16FR foley cath was removed from the tract with out difficulty.  Site was cleaned and prepped in a sterile fashion with betadine.  A 16FR foley cath was replaced into the tract no complications were noted. Urine return was noted, 10 ml of sterile water was inflated into the balloon and a leg bag was attached for drainage.  Patient tolerated well. A night bag was given to patient and proper instruction was given on how to switch bags.    Preformed by: Maralyn Sago watts, CMA  Follow up: 1  Month SP tube change

## 2015-05-11 ENCOUNTER — Emergency Department
Admission: EM | Admit: 2015-05-11 | Discharge: 2015-05-11 | Disposition: A | Discharge status: Home / Self Care | Payer: No Typology Code available for payment source | Attending: Emergency Medicine | Admitting: Emergency Medicine

## 2015-05-11 ENCOUNTER — Emergency Department: Payer: No Typology Code available for payment source

## 2015-05-11 DIAGNOSIS — S134XXA Sprain of ligaments of cervical spine, initial encounter: Secondary | ICD-10-CM | POA: Diagnosis not present

## 2015-05-11 DIAGNOSIS — N186 End stage renal disease: Secondary | ICD-10-CM | POA: Diagnosis not present

## 2015-05-11 DIAGNOSIS — Y92410 Unspecified street and highway as the place of occurrence of the external cause: Secondary | ICD-10-CM | POA: Diagnosis not present

## 2015-05-11 DIAGNOSIS — Z992 Dependence on renal dialysis: Secondary | ICD-10-CM | POA: Diagnosis not present

## 2015-05-11 DIAGNOSIS — Y9389 Activity, other specified: Secondary | ICD-10-CM | POA: Diagnosis not present

## 2015-05-11 DIAGNOSIS — Z880 Allergy status to penicillin: Secondary | ICD-10-CM | POA: Insufficient documentation

## 2015-05-11 DIAGNOSIS — Z792 Long term (current) use of antibiotics: Secondary | ICD-10-CM | POA: Insufficient documentation

## 2015-05-11 DIAGNOSIS — E1040 Type 1 diabetes mellitus with diabetic neuropathy, unspecified: Secondary | ICD-10-CM | POA: Diagnosis not present

## 2015-05-11 DIAGNOSIS — I120 Hypertensive chronic kidney disease with stage 5 chronic kidney disease or end stage renal disease: Secondary | ICD-10-CM | POA: Insufficient documentation

## 2015-05-11 DIAGNOSIS — Z794 Long term (current) use of insulin: Secondary | ICD-10-CM | POA: Diagnosis not present

## 2015-05-11 DIAGNOSIS — M545 Low back pain, unspecified: Secondary | ICD-10-CM

## 2015-05-11 DIAGNOSIS — Y998 Other external cause status: Secondary | ICD-10-CM | POA: Diagnosis not present

## 2015-05-11 DIAGNOSIS — Z7982 Long term (current) use of aspirin: Secondary | ICD-10-CM | POA: Diagnosis not present

## 2015-05-11 DIAGNOSIS — Z79899 Other long term (current) drug therapy: Secondary | ICD-10-CM | POA: Diagnosis not present

## 2015-05-11 DIAGNOSIS — S3992XA Unspecified injury of lower back, initial encounter: Secondary | ICD-10-CM | POA: Insufficient documentation

## 2015-05-11 DIAGNOSIS — S199XXA Unspecified injury of neck, initial encounter: Secondary | ICD-10-CM | POA: Diagnosis present
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
## 2015-05-11 NOTE — ED Notes (Signed)
Pt resting in bed, c-collar on, friend at bedside
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
## 2015-05-11 NOTE — ED Notes (Signed)
c-collar removed per Dr. Seabron Spates

## 2015-05-11 NOTE — ED Notes (Signed)
Pt arrives on backboard and c-collar; removed from backboard at EDP orders, c collar remains in place.

## 2015-05-12 DIAGNOSIS — N360 Urethral fistula: Secondary | ICD-10-CM | POA: Insufficient documentation

## 2015-05-12 DIAGNOSIS — Z435 Encounter for attention to cystostomy: Secondary | ICD-10-CM | POA: Insufficient documentation

## 2015-05-14 ENCOUNTER — Encounter: Payer: Self-pay | Admitting: Internal Medicine

## 2015-05-15 ENCOUNTER — Other Ambulatory Visit
Admission: RE | Admit: 2015-05-15 | Discharge: 2015-05-15 | Disposition: A | Discharge status: Home / Self Care | Payer: Medicare Other | Source: Other Acute Inpatient Hospital | Attending: Family Medicine | Admitting: Family Medicine

## 2015-05-15 ENCOUNTER — Telehealth: Payer: Self-pay

## 2015-05-15 ENCOUNTER — Ambulatory Visit (INDEPENDENT_AMBULATORY_CARE_PROVIDER_SITE_OTHER): Payer: Medicare Other | Admitting: Physician Assistant

## 2015-05-15 ENCOUNTER — Encounter: Payer: Self-pay | Admitting: Physician Assistant

## 2015-05-15 VITALS — BP 102/58 | HR 84 | Ht 75.0 in | Wt 185.0 lb

## 2015-05-15 DIAGNOSIS — I252 Old myocardial infarction: Secondary | ICD-10-CM

## 2015-05-15 DIAGNOSIS — I2510 Atherosclerotic heart disease of native coronary artery without angina pectoris: Secondary | ICD-10-CM

## 2015-05-15 DIAGNOSIS — I270 Primary pulmonary hypertension: Secondary | ICD-10-CM | POA: Diagnosis not present

## 2015-05-15 DIAGNOSIS — J9611 Chronic respiratory failure with hypoxia: Secondary | ICD-10-CM

## 2015-05-15 DIAGNOSIS — I5032 Chronic diastolic (congestive) heart failure: Secondary | ICD-10-CM | POA: Diagnosis not present

## 2015-05-15 DIAGNOSIS — I272 Pulmonary hypertension, unspecified: Secondary | ICD-10-CM

## 2015-05-15 DIAGNOSIS — J9610 Chronic respiratory failure, unspecified whether with hypoxia or hypercapnia: Secondary | ICD-10-CM | POA: Insufficient documentation

## 2015-05-15 DIAGNOSIS — I5030 Unspecified diastolic (congestive) heart failure: Secondary | ICD-10-CM | POA: Insufficient documentation

## 2015-05-15 DIAGNOSIS — D500 Iron deficiency anemia secondary to blood loss (chronic): Secondary | ICD-10-CM

## 2015-05-15 DIAGNOSIS — Z951 Presence of aortocoronary bypass graft: Secondary | ICD-10-CM

## 2015-05-15 DIAGNOSIS — N186 End stage renal disease: Secondary | ICD-10-CM

## 2015-05-15 DIAGNOSIS — I10 Essential (primary) hypertension: Secondary | ICD-10-CM

## 2015-05-15 DIAGNOSIS — Z992 Dependence on renal dialysis: Secondary | ICD-10-CM

## 2015-05-15 LAB — HEMOGLOBIN: Hemoglobin: 6.9 g/dL — ABNORMAL LOW (ref 13.0–18.0)

## 2015-05-15 LAB — HEMATOCRIT: HCT: 22.2 % — ABNORMAL LOW (ref 40.0–52.0)

## 2015-05-15 NOTE — Telephone Encounter (Signed)
S/w Foristein at [facility], [address] who states they will draw pre-cath labs (CBC, PT/INR, BMET, and lipid) on patient for [name] tomorrow during his dialysis. Will fax results to [name].

## 2015-05-15 NOTE — Patient Instructions (Signed)
Medication Instructions:  Your physician recommends that you continue on your current medications as directed. Please refer to the Current Medication list given to you today.   Labwork: Your physician recommends that you have labs at [facility] tomorrow 05/16/15   Testing/Procedures: Your physician has requested that you have a cardiac catheterization. Cardiac catheterization is used to diagnose and/or treat various heart conditions. Doctors may recommend this procedure for a number of different reasons. The most common reason is to evaluate chest pain. Chest pain can be a symptom of coronary artery disease (CAD), and cardiac catheterization can show whether plaque is narrowing or blocking your heart's arteries. This procedure is also used to evaluate the valves, as well as measure the blood flow and oxygen levels in different parts of your heart. For further information please visit [email or web address] Please follow instruction sheet, as given.    Follow-Up: Your physician recommends that you schedule a follow-up appointment after cardiac cath. With Dr. Kirke Corin   Any Other Special Instructions Will Be Listed Below (If Applicable).
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
## 2015-05-16 ENCOUNTER — Other Ambulatory Visit: Payer: Self-pay | Admitting: Nephrology

## 2015-05-17 ENCOUNTER — Other Ambulatory Visit: Payer: Self-pay | Admitting: Nephrology

## 2015-05-17 ENCOUNTER — Ambulatory Visit
Admission: RE | Admit: 2015-05-17 | Discharge: 2015-05-17 | Disposition: A | Discharge status: Home / Self Care | Payer: Medicare Other | Source: Ambulatory Visit | Attending: Nephrology | Admitting: Nephrology

## 2015-05-17 DIAGNOSIS — K922 Gastrointestinal hemorrhage, unspecified: Secondary | ICD-10-CM | POA: Diagnosis not present

## 2015-05-17 LAB — HEMOGLOBIN AND HEMATOCRIT, BLOOD
HEMATOCRIT: 19.7 % — AB (ref 40.0–52.0)
HEMATOCRIT: 23.7 % — AB (ref 40.0–52.0)
HEMOGLOBIN: 6.1 g/dL — AB (ref 13.0–18.0)
Hemoglobin: 7.5 g/dL — ABNORMAL LOW (ref 13.0–18.0)

## 2015-05-17 LAB — GLUCOSE, CAPILLARY
Glucose-Capillary: 155 mg/dL — ABNORMAL HIGH (ref 65–99)
Glucose-Capillary: 193 mg/dL — ABNORMAL HIGH (ref 65–99)

## 2015-05-17 LAB — PREPARE RBC (CROSSMATCH)

## 2015-05-17 MED ORDER — SODIUM CHLORIDE 0.9 % IV SOLN: Freq: Once | INTRAVENOUS | Status: DC

## 2015-05-17 NOTE — OR Nursing (Signed)
Patient arrived to room 23 in same day surgery via wheelchair with father present;  Pt is in nad; skin w/d; resp even and unlabored.  Patient is noted slightly pale in color; a/o x 4.  Denies any pain at the present time; patient states he has had a blood transfusion in the past without reaction.  Lab was called to room and specimen was received per MD order for type and screen/H&H.  Will continue to monitor the patient.

## 2015-05-17 NOTE — Discharge Instructions (Signed)
Follow up with Dr. Thedore Mins as needed.  Take all medications as prescribed.  Seek immediate medical attention or call 911 in the case of emergency. Make yourself familiar with signs and/or symptoms of reaction to blood transfusion.

## 2015-05-17 NOTE — Progress Notes (Signed)
Infusion complete at 1605, discharge instructions given to patient and father about s/s of transfusion reaction.  Iv discontinued, and pt taken via w/c to private car with personal O2 on.

## 2015-05-19 LAB — TYPE AND SCREEN
ABO/RH(D): O POS
ANTIBODY SCREEN: NEGATIVE
UNIT DIVISION: 0
Unit division: 0

## 2015-05-20 ENCOUNTER — Telehealth: Payer: Self-pay

## 2015-05-20 ENCOUNTER — Other Ambulatory Visit: Payer: Self-pay | Admitting: Cardiovascular Disease

## 2015-05-20 NOTE — Telephone Encounter (Signed)
S/w Forestine at [facility] regarding pt pre-cath and lipid labs. Have not yet been faxed to [name]. States she will fax to [name].

## 2015-05-20 NOTE — Patient Outreach (Signed)
[facility]) Care Management  05/20/2015  Taylor [name] [birth year] [id number]   Referral from High Risk List, assigned George Ina, RN to outreach.  [name]. [name] [facility] Care Management [facility] CM Assistant Phone: [phone] Fax: [phone]

## 2015-05-20 NOTE — Telephone Encounter (Signed)
S/w patient who states ok to proceed with cardiac cath. July 18, 8:30am Will put cath instructions in mail to patient today. Informed patient that if he does not have them by Thursday, call our office. Pt verbalized understanding with no further questions.

## 2015-05-22 ENCOUNTER — Ambulatory Visit: Payer: Medicare Other | Admitting: Family Medicine

## 2015-05-23 ENCOUNTER — Telehealth: Payer: Self-pay | Admitting: Urology

## 2015-05-23 NOTE — Telephone Encounter (Signed)
Received a fax from [facility].  Patient has an appointment scheduled on 06/26/2015 at 11:15am with Dr. Achilles Dunk

## 2015-05-27 ENCOUNTER — Encounter
Admission: RE | Disposition: A | Discharge status: Home / Self Care | Payer: Medicare Other | Source: Ambulatory Visit | Attending: Cardiovascular Disease

## 2015-05-27 ENCOUNTER — Ambulatory Visit
Admission: RE | Admit: 2015-05-27 | Discharge: 2015-05-27 | Disposition: A | Discharge status: Home / Self Care | Payer: Medicare Other | Source: Ambulatory Visit | Attending: Cardiovascular Disease | Admitting: Cardiovascular Disease

## 2015-05-27 ENCOUNTER — Encounter: Payer: Self-pay | Admitting: *Deleted

## 2015-05-27 DIAGNOSIS — I252 Old myocardial infarction: Secondary | ICD-10-CM | POA: Diagnosis not present

## 2015-05-27 DIAGNOSIS — E1122 Type 2 diabetes mellitus with diabetic chronic kidney disease: Secondary | ICD-10-CM | POA: Diagnosis not present

## 2015-05-27 DIAGNOSIS — Z992 Dependence on renal dialysis: Secondary | ICD-10-CM | POA: Insufficient documentation

## 2015-05-27 DIAGNOSIS — E1051 Type 1 diabetes mellitus with diabetic peripheral angiopathy without gangrene: Secondary | ICD-10-CM | POA: Insufficient documentation

## 2015-05-27 DIAGNOSIS — E039 Hypothyroidism, unspecified: Secondary | ICD-10-CM | POA: Insufficient documentation

## 2015-05-27 DIAGNOSIS — Z9114 Patient's other noncompliance with medication regimen: Secondary | ICD-10-CM | POA: Diagnosis not present

## 2015-05-27 DIAGNOSIS — D649 Anemia, unspecified: Secondary | ICD-10-CM | POA: Diagnosis not present

## 2015-05-27 DIAGNOSIS — M069 Rheumatoid arthritis, unspecified: Secondary | ICD-10-CM | POA: Insufficient documentation

## 2015-05-27 DIAGNOSIS — F419 Anxiety disorder, unspecified: Secondary | ICD-10-CM | POA: Insufficient documentation

## 2015-05-27 DIAGNOSIS — R05 Cough: Secondary | ICD-10-CM | POA: Diagnosis not present

## 2015-05-27 DIAGNOSIS — Z87891 Personal history of nicotine dependence: Secondary | ICD-10-CM | POA: Insufficient documentation

## 2015-05-27 DIAGNOSIS — Z7982 Long term (current) use of aspirin: Secondary | ICD-10-CM | POA: Diagnosis not present

## 2015-05-27 DIAGNOSIS — Z89422 Acquired absence of other left toe(s): Secondary | ICD-10-CM | POA: Diagnosis not present

## 2015-05-27 DIAGNOSIS — I270 Primary pulmonary hypertension: Secondary | ICD-10-CM | POA: Diagnosis not present

## 2015-05-27 DIAGNOSIS — I272 Other secondary pulmonary hypertension: Secondary | ICD-10-CM | POA: Insufficient documentation

## 2015-05-27 DIAGNOSIS — Z951 Presence of aortocoronary bypass graft: Secondary | ICD-10-CM | POA: Diagnosis not present

## 2015-05-27 DIAGNOSIS — E785 Hyperlipidemia, unspecified: Secondary | ICD-10-CM | POA: Insufficient documentation

## 2015-05-27 DIAGNOSIS — F329 Major depressive disorder, single episode, unspecified: Secondary | ICD-10-CM | POA: Insufficient documentation

## 2015-05-27 DIAGNOSIS — Z9981 Dependence on supplemental oxygen: Secondary | ICD-10-CM | POA: Insufficient documentation

## 2015-05-27 DIAGNOSIS — I120 Hypertensive chronic kidney disease with stage 5 chronic kidney disease or end stage renal disease: Secondary | ICD-10-CM | POA: Insufficient documentation

## 2015-05-27 DIAGNOSIS — Z89421 Acquired absence of other right toe(s): Secondary | ICD-10-CM | POA: Insufficient documentation

## 2015-05-27 DIAGNOSIS — N186 End stage renal disease: Secondary | ICD-10-CM | POA: Diagnosis not present

## 2015-05-27 DIAGNOSIS — I5032 Chronic diastolic (congestive) heart failure: Secondary | ICD-10-CM | POA: Diagnosis not present

## 2015-05-27 DIAGNOSIS — I2510 Atherosclerotic heart disease of native coronary artery without angina pectoris: Secondary | ICD-10-CM | POA: Diagnosis present

## 2015-05-27 DIAGNOSIS — K219 Gastro-esophageal reflux disease without esophagitis: Secondary | ICD-10-CM | POA: Insufficient documentation

## 2015-05-27 DIAGNOSIS — Z794 Long term (current) use of insulin: Secondary | ICD-10-CM | POA: Diagnosis not present

## 2015-05-27 HISTORY — PX: CARDIAC CATHETERIZATION: SHX172

## 2015-05-27 LAB — CBC
HEMATOCRIT: 25.9 % — AB (ref 40.0–52.0)
HEMOGLOBIN: 8.1 g/dL — AB (ref 13.0–18.0)
MCH: 27.5 pg (ref 26.0–34.0)
MCHC: 31.3 g/dL — AB (ref 32.0–36.0)
MCV: 87.9 fL (ref 80.0–100.0)
Platelets: 172 10*3/uL (ref 150–440)
RBC: 2.94 MIL/uL — ABNORMAL LOW (ref 4.40–5.90)
RDW: 18.1 % — ABNORMAL HIGH (ref 11.5–14.5)
WBC: 7.3 10*3/uL (ref 3.8–10.6)

## 2015-05-27 LAB — BASIC METABOLIC PANEL
Anion gap: 17 — ABNORMAL HIGH (ref 5–15)
BUN: 42 mg/dL — ABNORMAL HIGH (ref 6–20)
CALCIUM: 6.7 mg/dL — AB (ref 8.9–10.3)
CO2: 24 mmol/L (ref 22–32)
Chloride: 88 mmol/L — ABNORMAL LOW (ref 101–111)
Creatinine, Ser: 3.97 mg/dL — ABNORMAL HIGH (ref 0.61–1.24)
GFR calc non Af Amer: 17 mL/min — ABNORMAL LOW (ref >60)
GFR, EST AFRICAN AMERICAN: 19 mL/min — AB (ref >60)
Glucose, Bld: 459 mg/dL — ABNORMAL HIGH (ref 65–99)
Potassium: 4.7 mmol/L (ref 3.5–5.1)
SODIUM: 129 mmol/L — AB (ref 135–145)

## 2015-05-27 SURGERY — RIGHT HEART CATH
Anesthesia: Moderate Sedation

## 2015-05-27 MED ORDER — SODIUM CHLORIDE 0.9 % IJ SOLN
3.0000 mL | INTRAMUSCULAR | Status: DC | PRN
Start: 2015-05-27 — End: 2015-05-27

## 2015-05-27 MED ORDER — HEPARIN (PORCINE) IN NACL 2-0.9 UNIT/ML-% IJ SOLN
INTRAMUSCULAR | Status: AC
  Filled 2015-05-27: qty 500

## 2015-05-27 MED ORDER — SODIUM CHLORIDE 0.9 % IV SOLN: 250.0000 mL | INTRAVENOUS | Status: DC | PRN

## 2015-05-27 MED ORDER — FENTANYL CITRATE (PF) 100 MCG/2ML IJ SOLN
INTRAMUSCULAR | Status: DC | PRN
  Administered 2015-05-27 (×2): 50 ug via INTRAVENOUS

## 2015-05-27 MED ORDER — SODIUM CHLORIDE 0.9 % IV SOLN: INTRAVENOUS | Status: DC

## 2015-05-27 MED ORDER — FENTANYL CITRATE (PF) 100 MCG/2ML IJ SOLN
INTRAMUSCULAR | Status: AC
  Filled 2015-05-27: qty 2

## 2015-05-27 MED ORDER — LIDOCAINE HCL (PF) 1 % IJ SOLN
INTRAMUSCULAR | Status: DC | PRN
  Administered 2015-05-27: 12 mL via SUBCUTANEOUS

## 2015-05-27 MED ORDER — SODIUM CHLORIDE 0.9 % IJ SOLN: 3.0000 mL | INTRAMUSCULAR | Status: DC | PRN

## 2015-05-27 MED ORDER — SODIUM CHLORIDE 0.9 % IJ SOLN: 3.0000 mL | Freq: Two times a day (BID) | INTRAMUSCULAR | Status: DC

## 2015-05-27 SURGICAL SUPPLY — 6 items
CATH SWANZ 7F THERMO (CATHETERS) ×3
KIT RIGHT HEART (MISCELLANEOUS) ×3
NDL PERC 18GX7CM (NEEDLE) IMPLANT
NEEDLE PERC 18GX7CM (NEEDLE) ×3
PACK CARDIAC CATH (CUSTOM PROCEDURE TRAY) ×3
SHEATH PINNACLE 7F 10CM (SHEATH) ×3

## 2015-05-27 NOTE — Interval H&P Note (Signed)
History and Physical Interval Note:  05/27/2015 9:35 AM  Taylor [name]  has presented today for surgery, with the diagnosis of Pulmonary hypertension  The various methods of treatment have been discussed with the patient and family. After consideration of risks, benefits and other options for treatment, the patient has consented to  Procedure(s): Right Heart Cath (N/A) as a surgical intervention .  The patient's history has been reviewed, patient examined, no change in status, stable for surgery.  I have reviewed the patient's chart and labs.  Questions were answered to the patient's satisfaction.     Lorine Bears
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
## 2015-05-27 NOTE — Discharge Instructions (Signed)

## 2015-05-29 ENCOUNTER — Other Ambulatory Visit: Payer: Self-pay

## 2015-05-29 NOTE — Patient Outreach (Signed)
[facility]) Care Management  05/29/2015  Taylor [name] Mar 16, 1971 [id number]   Telephone call to patient regarding high risk list referral.  HIPAA verified with patient.  Discussed and offered [facility] care management services to patient.  Patient refused services.  Patient states he presently has home health providing services.  Patient refused [facility] care management contact information for future use.   PLAN: RNCM will refer patient to Nena Polio to close due to refusal of services. RNCM will notify patients primary MD.  George Ina RN,BSN,CCM [facility] Telephonic Care Coordinator [phone]

## 2015-06-03 ENCOUNTER — Telehealth: Payer: Self-pay

## 2015-06-03 ENCOUNTER — Ambulatory Visit: Payer: Medicare Other | Admitting: Family Medicine

## 2015-06-03 NOTE — Telephone Encounter (Signed)
Received request for pt to proceed w/ colonoscopy/EGD at [facility] w/ propofol. Per Eula Listen, PA, pt is cleared to proceed at high risk. Faxed to [facility] at [phone].

## 2015-06-04 NOTE — Patient Outreach (Signed)
[facility]) Care Management  06/04/2015  RYO KLANG June 29, 1971 [id number]   Notification from George Ina, RN to close case due to patient refused [facility] Care Management services.  [name]. [name] [facility] Care Management [name] CM Assistant Phone: [phone] Fax: [phone]

## 2015-06-10 ENCOUNTER — Ambulatory Visit (INDEPENDENT_AMBULATORY_CARE_PROVIDER_SITE_OTHER): Payer: Medicare Other | Admitting: Urology

## 2015-06-10 ENCOUNTER — Other Ambulatory Visit: Payer: Self-pay | Admitting: *Deleted

## 2015-06-10 ENCOUNTER — Encounter: Payer: Self-pay | Admitting: Urology

## 2015-06-10 ENCOUNTER — Emergency Department
Admission: EM | Admit: 2015-06-10 | Discharge: 2015-06-10 | Disposition: A | Discharge status: Home / Self Care | Payer: Medicare Other | Attending: Emergency Medicine | Admitting: Emergency Medicine

## 2015-06-10 ENCOUNTER — Encounter: Payer: Self-pay | Admitting: *Deleted

## 2015-06-10 ENCOUNTER — Encounter: Payer: Self-pay | Admitting: Emergency Medicine

## 2015-06-10 ENCOUNTER — Ambulatory Visit (INDEPENDENT_AMBULATORY_CARE_PROVIDER_SITE_OTHER): Payer: Medicare Other | Admitting: Family Medicine

## 2015-06-10 VITALS — BP 155/82 | HR 83 | Ht 72.0 in | Wt 185.0 lb

## 2015-06-10 DIAGNOSIS — Y846 Urinary catheterization as the cause of abnormal reaction of the patient, or of later complication, without mention of misadventure at the time of the procedure: Secondary | ICD-10-CM | POA: Diagnosis not present

## 2015-06-10 DIAGNOSIS — Z880 Allergy status to penicillin: Secondary | ICD-10-CM | POA: Insufficient documentation

## 2015-06-10 DIAGNOSIS — E1040 Type 1 diabetes mellitus with diabetic neuropathy, unspecified: Secondary | ICD-10-CM | POA: Diagnosis not present

## 2015-06-10 DIAGNOSIS — I120 Hypertensive chronic kidney disease with stage 5 chronic kidney disease or end stage renal disease: Secondary | ICD-10-CM | POA: Insufficient documentation

## 2015-06-10 DIAGNOSIS — N4889 Other specified disorders of penis: Secondary | ICD-10-CM

## 2015-06-10 DIAGNOSIS — I2510 Atherosclerotic heart disease of native coronary artery without angina pectoris: Secondary | ICD-10-CM

## 2015-06-10 DIAGNOSIS — Z992 Dependence on renal dialysis: Secondary | ICD-10-CM | POA: Insufficient documentation

## 2015-06-10 DIAGNOSIS — T83018A Breakdown (mechanical) of other indwelling urethral catheter, initial encounter: Secondary | ICD-10-CM | POA: Diagnosis not present

## 2015-06-10 DIAGNOSIS — N186 End stage renal disease: Secondary | ICD-10-CM | POA: Diagnosis not present

## 2015-06-10 DIAGNOSIS — R339 Retention of urine, unspecified: Secondary | ICD-10-CM | POA: Insufficient documentation

## 2015-06-10 DIAGNOSIS — N360 Urethral fistula: Secondary | ICD-10-CM

## 2015-06-10 DIAGNOSIS — T83010A Breakdown (mechanical) of cystostomy catheter, initial encounter: Secondary | ICD-10-CM

## 2015-06-10 LAB — BLADDER SCAN AMB NON-IMAGING: SCAN RESULT: 999

## 2015-06-10 MED ORDER — CLINDAMYCIN HCL 300 MG PO CAPS: 300.0000 mg | ORAL_CAPSULE | Freq: Three times a day (TID) | ORAL | Status: DC

## 2015-06-10 NOTE — ED Notes (Signed)
pt has suprapubic cathertor placed. Pt reports drainage has been different for the past couple weeks, noting blood and foul odor. Pt rpeorts having urinary infection for the past year. During visit to Dr.Brenen to have new bag placed pt reports MD found that catheter had been dislodged and believed that incision had sealed shut. Yellow drainage noted on bandage upon arrival to ED. Pt reports area is painful and tender to the touch.
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
## 2015-06-10 NOTE — ED Notes (Signed)
Pt presents to ed with reports of his doctor sending him over for insertion of suprapubic catheter. Pt with urinary retention.

## 2015-06-10 NOTE — ED Notes (Signed)
512 per bladder scan.

## 2015-06-10 NOTE — Progress Notes (Signed)
06/10/2015 1:07 PM   Taylor [name] 1971/05/04 [id number]  Referring provider: Dianne Dun, MD [address] [place], [place] [address]  Chief Complaint  Patient presents with  . Follow-up    sp tube change    HPI: Taylor [name] is a 44 year old who presents today for SPT exchange. He states he's been experiencing lower urinary outputs for the last 2 weeks.   He also reports that his penile swelling is more severe, he is having more purulent drainage from the penis and there is a very foul odor to the drainage.  He also states that his blood sugars have been "through the roof."   He denies fever, chills, nausea or vomiting.  His vital signs are stable at this time.  Patient reports suprapubic pain for the last 2 weeks.  He did not contact us sooner for unknown reasons. He just stated he wanted to wait for the appointment.   PMH: Past Medical History  Diagnosis Date  . End stage renal disease on dialysis     LUE fistula  . Type I diabetes mellitus     a. 03/2014 admitted with HNK to [facility]. b. TTS  . Diabetic neuropathy     severe, s/p multiple toe amputation  . Hypothyroidism   . Hypertension   . Hyperlipidemia   . H/O hiatal hernia   . GERD (gastroesophageal reflux disease)   . Anxiety   . Sebaceous cyst     side of neck  . Pneumonia     2010  . Anemia     a. req PRBC's 2011.  [name] PAD (peripheral artery disease)     a. s/p amputation of toes on the right;  b. left LE claudication.  . Coronary artery disease     a. s/p MI;  b. 10/2009 CABG x 3 @ [facility]: LIMA->LAD, VG->OM3, VG->RPDA; c. 11/2010 Cath 3/3 patent grafts;  d 12/2012 Cath: LM 30d, LAD 85p, D1 70, D2 90, LCX 40ost, OM2 100, RCA 90p, 123m, L->LAD ok, VG->OM3 ok, VG->RPDA 30, EF 50%->Med Rx.  . Cataract     right  . Valvular disease     a. 11/2012 Echo: EF 55-60%, mild LVH, mild MR, mild bi-atrial enlargement, mild-mod TR, PASP ; b. echo 03/2014: EF 50-55%, nl WM, select images concerning for  bicuspid aortic valve w/ nl thicknes of leaflets, mild MR, mild bi-atrial dilatation, RV mild dilatation - wall thickness nl, mod TR - select images appears to be mod to sev, PASP at least mod elevated.  . Diastolic CHF     see echo above  . Depression   . Arthritis     rheumatoid arthritis   . Myocardial infarction 2010  . Pulmonary HTN     a. continuation of 03/2014 echo PASP @ least mod elevated. RVSP 52 mm Hg. Parasternal long axis estimated @ 85 mm Hg  . Scrotal abscess     a. s/p multiple I&D  . Penile abscess     a. s/p multiple I&D  . Hematemesis     a. EGD 2016 with LA Grade C esophagitis, continued on Protonix   . Chronic respiratory failure     a. on 3L oxygen via nasal cannula  . Anxiety   . Acute delirium   . Sepsis   . 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 Dialysis fistula creation      left upper  arm fistula  . Amputation      TOES ON BOTH FEET  . Abscess drainage  Behind right ear/occipital scalp  . Skin graft    . Eye surgery  1999  . Coronary artery bypass graft  10/2009    [facility] (Dr. Katrinka Blazing)  . Foot amputation    . Cardiac catheterization    . Cardiac catheterization  2/14    [facility]: severe 3 vessel CAD with patent grafts, RHC: moderately elevated PCW and pulmonary hypertension  . Penile prosthesis implant N/A 07/24/2014    Procedure: SALINE PENILE INJECTION WITH DISSECTION OF CORPORA ,  IMPLANTATION OF COLOPLAST PENILE PROTHESIS INFLATABLE;  Surgeon: [name], MD;  Location: [facility] ORS;  Service: Urology;  Laterality: N/A;  . Removal of penile prosthesis N/A 08/22/2014    Procedure: EXPLANT OF INFECTED  PENILE PROSTHESIS;  Surgeon: [name], MD;  Location: [facility] ORS;  Service: Urology;  Laterality: N/A;  . Irrigation and debridement abscess Bilateral 12/14/2014    Procedure: Bilateral Corporal Irrigation with Cultures and Drainage, Penrose Drain Insertion;  Surgeon: [name], MD;  Location: [facility] OR;  Service: Urology;  Laterality: Bilateral;  . Scrotal exploration N/A 02/03/2015    Procedure: IRRIGATION AND DEBRIDEMENT SCROTAL/PENILE ABSCESS;  Surgeon: [name], MD;  Location: [facility] OR;  Service: Urology;  Laterality: N/A;  . Cystoscopy N/A 02/03/2015    Procedure: CYSTOSCOPY;  Surgeon: [name], MD;  Location: [facility] OR;  Service: Urology;  Laterality: N/A;  . Esophagogastroduodenoscopy N/A 03/18/2015    Procedure: ESOPHAGOGASTRODUODENOSCOPY (EGD);  Surgeon: [name], MD;  Location: [facility] ENDOSCOPY;  Service: Endoscopy;  Laterality: N/A;  . Cardiac catheterization N/A 05/27/2015    Procedure: Right Heart Cath;  Surgeon: [name], MD;  Location: [facility] INVASIVE CV LAB;  Service: Cardiovascular;  Laterality: N/A;    Home Medications:    Medication List       This list is accurate as of: 06/10/15  1:07 PM.  Always use your most recent med list.               acetaminophen 500 MG tablet  Commonly known as:  TYLENOL  Take 1,000 mg by mouth every 6 (six) hours as needed for mild pain.     ALPRAZolam 0.25 MG tablet  Commonly known as:  XANAX  Take 1 tablet (0.25 mg total) by mouth every 6 (six) hours as needed for anxiety.     alum & mag hydroxide-simeth 400-400-40 MG/5ML suspension  Commonly known as:  MAALOX PLUS  Take 30 mLs by mouth every 4 (four) hours as needed for indigestion.     amitriptyline 25 MG tablet  Commonly known as:  ELAVIL  Take 1 tablet (25 mg total) by mouth at bedtime.     aspirin 81 MG EC tablet  Take 81 mg by mouth daily. Swallow whole.     calcium carbonate 500 MG chewable tablet  Commonly known as:  TUMS - dosed in mg elemental calcium  Chew 1-2 tablets (200-400 mg of elemental calcium total) by mouth 2 (two) times daily as needed for indigestion or heartburn.     calcium-vitamin D 500-400 MG-UNIT per tablet  Commonly known as:  OSCAL-500  Take by mouth.     carvedilol 25 MG tablet  Commonly known  as:  COREG  Take 1 tablet (25 mg total) by mouth 2 (two) times daily with a meal.     cinacalcet 60 MG tablet  Commonly known as:  SENSIPAR  Take 60 mg by mouth daily.     cloNIDine 0.1 MG tablet  Commonly known as:  CATAPRES  Take 0.1 mg by mouth 2 (two) times daily.     DHA-EPA-VITAMIN E PO  Take by mouth.     feeding supplement (NEPRO CARB STEADY) Liqd  Take 237 mLs by mouth as needed (missed meal during dialysis.).     Fish Oil 1000 MG Caps  Take 1 capsule by mouth daily.     furosemide 80 MG tablet  Commonly known as:  LASIX  Take 80 mg by mouth 2 (two) times daily.     HYDROcodone-acetaminophen 5-325 MG per tablet  Commonly known as:  NORCO/VICODIN  Take 1 tablet by mouth every 6 (six) hours as needed for moderate pain.     insulin aspart 100 UNIT/ML injection  Commonly known as:  novoLOG  Inject 4 Units into the skin 3 (three) times daily with meals.     insulin aspart 100 UNIT/ML injection  Commonly known as:  novoLOG  Inject 0-7 Units into the skin 4 (four) times daily -  before meals and at bedtime.     insulin detemir 100 UNIT/ML injection  Commonly known as:  LEVEMIR  Inject 0.14 mLs (14 Units total) into the skin at bedtime.     Insulin Syringe-Needle U-100 31G X 5/16" 0.5 ML Misc  Commonly known as:  B-D INS SYRINGE 0.5CC/31GX5/16  USE THREE TIMES DAILY     isosorbide dinitrate 30 MG tablet  Commonly known as:  ISORDIL  Take by mouth.     isosorbide mononitrate 30 MG 24 hr tablet  Commonly known as:  IMDUR  Take 30 mg by mouth 2 (two) times daily.     levothyroxine 75 MCG tablet  Commonly known as:  SYNTHROID, LEVOTHROID  Take 1 tablet (75 mcg total) by mouth daily before breakfast.     lisinopril 10 MG tablet  Commonly known as:  PRINIVIL,ZESTRIL  Take 10 mg by mouth daily.     metolazone 5 MG tablet  Commonly known as:  ZAROXOLYN  Take by mouth.     NEURONTIN 300 MG capsule  Generic drug:  gabapentin  TAKE 1 BY MOUTH DAILY AND  1 BY  MOUTH 4 HOURS AFTER   DIALYSIS     nitroGLYCERIN 0.4 MG SL tablet  Commonly known as:  NITROSTAT  Place under the tongue.     ondansetron 4 MG disintegrating tablet  Commonly known as:  ZOFRAN ODT  Take 1 tablet (4 mg total) by mouth every 8 (eight) hours as needed for nausea or vomiting.     ondansetron 4 MG tablet  Commonly known as:  ZOFRAN  Take by mouth.     oxyCODONE 5 MG immediate release tablet  Commonly known as:  Oxy IR/ROXICODONE  Take 1 tablet (5 mg total) by mouth every 4 (four) hours as needed for moderate pain.     pantoprazole 40 MG tablet  Commonly known as:  PROTONIX  Take 40 mg by mouth 2 (two) times daily.     PARoxetine 20 MG tablet  Commonly known as:  PAXIL  Take 20 mg by mouth daily.     pregabalin 75 MG capsule  Commonly known as:  LYRICA  Take 150 mg by mouth 2 (two) times daily.     PRILOSEC 40 MG capsule  Generic drug:  omeprazole  TAKE 1 BY MOUTH DAILY     promethazine 25 MG tablet  Commonly known  as:  PHENERGAN  Take 1 tablet (25 mg total) by mouth every 6 (six) hours as needed for nausea or vomiting.     rosuvastatin 20 MG tablet  Commonly known as:  CRESTOR  Take 1 tablet (20 mg total) by mouth at bedtime.     sucralfate 1 G tablet  Commonly known as:  CARAFATE  Take by mouth.     sulfamethoxazole-trimethoprim 400-80 MG per tablet  Commonly known as:  BACTRIM,SEPTRA  Take by mouth.     traZODone 50 MG tablet  Commonly known as:  DESYREL  Take by mouth.     zolpidem 10 MG tablet  Commonly known as:  AMBIEN  Take by mouth.        Allergies:  Allergies  Allergen Reactions  . Hydromorphone   . Amoxicillin-Pot Clavulanate Nausea And Vomiting  . Rifampin Nausea And Vomiting    Family History: Family History  Problem Relation Age of Onset  . Heart disease Other   . Hypertension Other   . Hypertension Mother   . Heart disease Mother   . Diabetes Mother   . Birth defects Paternal Uncle     unaware  . Birth defects  Paternal Grandmother     breast  . Kidney disease Neg Hx   . Prostate cancer Neg Hx     Social History:  reports that he has never smoked. He quit smokeless tobacco use about 19 years ago. His smokeless tobacco use included Chew. He reports that he does not drink alcohol or use illicit drugs.  ROS: UROLOGY Frequent Urination?: No Hard to postpone urination?: No Burning/pain with urination?: Yes Get up at night to urinate?: No Leakage of urine?: No Urine stream starts and stops?: No Trouble starting stream?: No Do you have to strain to urinate?: No Blood in urine?: Yes Urinary tract infection?: No Sexually transmitted disease?: No Injury to kidneys or bladder?: No Painful intercourse?: No Weak stream?: No Erection problems?: No Penile pain?: No  Gastrointestinal Nausea?: No Vomiting?: No Indigestion/heartburn?: No Diarrhea?: No Constipation?: No  Constitutional Fever: No Night sweats?: No Weight loss?: No Fatigue?: No  Skin Skin rash/lesions?: No Itching?: No  Eyes Blurred vision?: No Double vision?: No  Ears/Nose/Throat Sore throat?: No Sinus problems?: No  Hematologic/Lymphatic Swollen glands?: No Easy bruising?: No  Cardiovascular Leg swelling?: Yes Chest pain?: No  Respiratory Cough?: Yes Shortness of breath?: No  Endocrine Excessive thirst?: No  Musculoskeletal Back pain?: Yes Joint pain?: No  Neurological Headaches?: No Dizziness?: No  Psychologic Depression?: No Anxiety?: Yes  Physical Exam: BP 155/82 mmHg  Pulse 83  Ht 6' (1.829 m)  Wt 185 lb (83.915 kg)  BMI 25.08 kg/m2  Constitutional:  Alert and oriented, No acute distress. HEENT: Facial skin appears gray, skin is dry and warm. Cardiovascular: Lower leg edema bilaterally.  Respiratory: Normal respiratory effort, no increased work of breathing. GI: Abdomen is firm, tender, and distended GU: No CVA tenderness.  Skin: No rashes, bruises or suspicious lesions. Lymph: No  cervical or inguinal adenopathy. Neurologic: Grossly intact, no focal deficits, moving all 4 extremities. Psychiatric: Normal mood and affect.  Laboratory Data: Lab Results  Component Value Date   WBC 7.3 05/27/2015   HGB 8.1* 05/27/2015   HCT 25.9* 05/27/2015   MCV 87.9 05/27/2015   PLT 172 05/27/2015    Lab Results  Component Value Date   CREATININE 3.97* 05/27/2015    No results found for: PSA  No results found for: TESTOSTERONE  Lab Results  Component Value Date   HGBA1C 15.4* 12/10/2014    Urinalysis    Component Value Date/Time   COLORURINE Yellow 03/03/2015 1556   COLORURINE AMBER* 02/02/2015 0357   APPEARANCEUR Clear 03/03/2015 1556   APPEARANCEUR TURBID* 02/02/2015 0357   LABSPEC 1.014 03/03/2015 1556   LABSPEC 1.021 02/02/2015 0357   PHURINE 6.0 03/03/2015 1556   PHURINE 6.0 02/02/2015 0357   GLUCOSEU >=500 03/03/2015 1556   GLUCOSEU NEGATIVE 02/02/2015 0357   HGBUR 1+ 03/03/2015 1556   HGBUR LARGE* 02/02/2015 0357   BILIRUBINUR Negative 03/03/2015 1556   BILIRUBINUR SMALL* 02/02/2015 0357   KETONESUR 2+ 03/03/2015 1556   KETONESUR 40* 02/02/2015 0357   PROTEINUR >=500 03/03/2015 1556   PROTEINUR >300* 02/02/2015 0357   UROBILINOGEN 0.2 02/02/2015 0357   NITRITE Negative 03/03/2015 1556   NITRITE NEGATIVE 02/02/2015 0357   LEUKOCYTESUR 1+ 03/03/2015 1556   LEUKOCYTESUR LARGE* 02/02/2015 0357    Procedure:     Suprapubic Cath Change  Patient is present today for a suprapubic catheter change due to urinary retention.   6 ml of water was drained from the balloon, a 16 FR foley cath was located very superficially and was removed from the tract with out difficulty.  Site was cleaned and prepped in a sterile fashion with betadine.  A 16 FR foley cath was attempted to be replaced into the tract complications were noted as: but the tract was not accessable and no urine was returned. The procedure was aborted.    Preformed by: Lajuana Ripple  PA-C  Follow up: Patient was sent to the ED.  Dr. Apolinar Junes is notified and she will evaluate him the ED.    Assessment & Plan:    1. 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   He has an upcoming appointment with Dr. Achilles Dunk in August for further management of this condition.    2. SPT exchange: SPT could not be exchanged.  The former tube was removed and a new one could not be placed. Dr. Apolinar Junes is notified and she will see the patient in the ED.      There are no diagnoses linked to this encounter.  No Follow-up on file.  Michiel Cowboy, PA-C  [facility] [address] [place], [place] [address] [phone]

## 2015-06-10 NOTE — Consult Note (Signed)
Urology Consult  I have been asked to see the patient by Dr.Williams , for evaluation and management of SPT, chronic penoscrotal abscess.  Chief Complaint: Difficult SPT replacement  History of Present Illness: Taylor [name] is a 44 y.o. 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 He was seen earlier in the office by the PA for SP tube exchange, however, the tube was unable to be replaced and he was sent to the ER for further evaluation. In addition, he was noted to have a slightly increased amount of purulent penile drainage and is slightly more edematous than previously.[name]    He reports that his SP tube had been draining fairly well prior to catheter exchange. He has had some leakage of fluid per urethra along with some bloody discharge from the glans over the past few days.  He is on dialysis but continues to make some urine. He states his urine output has been slightly lower over the past few weeks. No fevers or chills.  Per the ER, he had greater than 1 L in his bladder on bladder scan, however, given the patient's history of ascites, this is not an accurate assessment.  The patient does report that he feels the sensation like needing to urinate.  Past Medical History  Diagnosis Date  . End stage renal disease on dialysis     LUE fistula  . Type I diabetes mellitus     a. 03/2014 admitted with HNK to [facility]. b. TTS  . Diabetic neuropathy     severe, s/p multiple toe amputation  . Hypothyroidism   . Hypertension   . Hyperlipidemia   . H/O hiatal hernia   . GERD (gastroesophageal reflux disease)   . Anxiety   . Sebaceous cyst     side of neck  . Pneumonia     2010  . Anemia     a. req PRBC's 2011.  [name] PAD (peripheral artery disease)     a. s/p amputation of toes on the right;  b. left LE claudication.  . Coronary artery disease      a. s/p MI;  b. 10/2009 CABG x 3 @ [facility]: LIMA->LAD, VG->OM3, VG->RPDA; c. 11/2010 Cath 3/3 patent grafts;  d 12/2012 Cath: LM 30d, LAD 85p, D1 70, D2 90, LCX 40ost, OM2 100, RCA 90p, 166m, L->LAD ok, VG->OM3 ok, VG->RPDA 30, EF 50%->Med Rx.  . Cataract     right  . Valvular disease     a. 11/2012 Echo: EF 55-60%, mild LVH, mild MR, mild bi-atrial enlargement, mild-mod TR, PASP ; b. echo 03/2014: EF 50-55%, nl WM, select images concerning for bicuspid aortic valve w/ nl thicknes of leaflets, mild MR, mild bi-atrial dilatation, RV mild dilatation - wall thickness nl, mod TR - select images appears to be mod to sev, PASP at least mod elevated.  . Diastolic CHF     see echo above  . Depression   . Arthritis     rheumatoid arthritis   . Myocardial infarction 2010  . Pulmonary HTN     a. continuation of 03/2014 echo PASP @ least mod elevated. RVSP 52 mm Hg. Parasternal long axis estimated @ 85 mm Hg  . Scrotal abscess     a. s/p multiple I&D  . Penile abscess     a. s/p multiple I&D  . Hematemesis  a. EGD 2016 with LA Grade C esophagitis, continued on Protonix   . Chronic respiratory failure     a. on 3L oxygen via nasal cannula  . Anxiety   . Acute delirium   . Sepsis   . 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 Dialysis fistula creation      left upper arm fistula  . Amputation      TOES ON BOTH FEET  . Abscess drainage  Behind right ear/occipital scalp  . Skin graft    . Eye surgery  1999  . Coronary artery bypass graft  10/2009    [facility] (Dr. Katrinka Blazing)  . Foot amputation    . Cardiac catheterization    . Cardiac catheterization  2/14    [facility]: severe 3 vessel CAD with patent grafts, RHC: moderately elevated PCW and pulmonary hypertension  . Penile prosthesis implant N/A 07/24/2014    Procedure: SALINE PENILE INJECTION WITH DISSECTION OF CORPORA ,  IMPLANTATION OF COLOPLAST PENILE PROTHESIS INFLATABLE;  Surgeon: [name], MD;  Location: [facility] ORS;  Service: Urology;  Laterality: N/A;  . Removal of penile prosthesis N/A 08/22/2014    Procedure: EXPLANT OF INFECTED  PENILE PROSTHESIS;  Surgeon: [name], MD;  Location: [facility] ORS;  Service: Urology;  Laterality: N/A;  . Irrigation and debridement abscess Bilateral 12/14/2014    Procedure: Bilateral Corporal Irrigation with Cultures and Drainage, Penrose Drain Insertion;  Surgeon: [name], MD;  Location: [facility] OR;  Service: Urology;  Laterality: Bilateral;  . Scrotal exploration N/A 02/03/2015    Procedure: IRRIGATION AND DEBRIDEMENT SCROTAL/PENILE ABSCESS;  Surgeon: [name], MD;  Location: [facility] OR;  Service: Urology;  Laterality: N/A;  . Cystoscopy N/A 02/03/2015    Procedure: CYSTOSCOPY;  Surgeon: [name], MD;  Location: [facility] OR;  Service: Urology;  Laterality: N/A;  . Esophagogastroduodenoscopy N/A 03/18/2015    Procedure: ESOPHAGOGASTRODUODENOSCOPY (EGD);  Surgeon: [name], MD;  Location: [facility] ENDOSCOPY;  Service: Endoscopy;  Laterality: N/A;  . Cardiac catheterization N/A 05/27/2015    Procedure: Right Heart Cath;  Surgeon: [name], MD;  Location: [facility] INVASIVE CV LAB;  Service: Cardiovascular;  Laterality: N/A;    Home Medications:    Medication List    TAKE these medications        clindamycin 300 MG capsule  Commonly known as:  CLEOCIN  Take 1 capsule (300 mg total) by mouth 3 (three) times daily.      ASK your doctor about these medications        acetaminophen 500 MG tablet  Commonly known as:  TYLENOL  Take 1,000 mg by mouth every 6 (six) hours as needed for mild pain.     ALPRAZolam 0.25 MG tablet  Commonly known as:  XANAX  Take 1 tablet (0.25 mg total) by mouth every 6 (six) hours as needed for anxiety.     alum & mag hydroxide-simeth 400-400-40 MG/5ML suspension  Commonly known as:  MAALOX PLUS  Take 30 mLs by mouth every 4 (four) hours as needed for indigestion.     amitriptyline 25 MG  tablet  Commonly known as:  ELAVIL  Take 1 tablet (25 mg total) by mouth at bedtime.     aspirin 81 MG EC tablet  Take 81 mg by mouth daily. Swallow whole.     calcium carbonate 500 MG chewable tablet  Commonly known as:  TUMS - dosed in mg elemental  calcium  Chew 1-2 tablets (200-400 mg of elemental calcium total) by mouth 2 (two) times daily as needed for indigestion or heartburn.     calcium-vitamin D 500-400 MG-UNIT per tablet  Commonly known as:  OSCAL-500  Take by mouth.     carvedilol 25 MG tablet  Commonly known as:  COREG  Take 1 tablet (25 mg total) by mouth 2 (two) times daily with a meal.     cinacalcet 60 MG tablet  Commonly known as:  SENSIPAR  Take 60 mg by mouth daily.     cloNIDine 0.1 MG tablet  Commonly known as:  CATAPRES  Take 0.1 mg by mouth 2 (two) times daily.     DHA-EPA-VITAMIN E PO  Take by mouth.     feeding supplement (NEPRO CARB STEADY) Liqd  Take 237 mLs by mouth as needed (missed meal during dialysis.).     Fish Oil 1000 MG Caps  Take 1 capsule by mouth daily.     furosemide 80 MG tablet  Commonly known as:  LASIX  Take 80 mg by mouth 2 (two) times daily.     HYDROcodone-acetaminophen 5-325 MG per tablet  Commonly known as:  NORCO/VICODIN  Take 1 tablet by mouth every 6 (six) hours as needed for moderate pain.     insulin aspart 100 UNIT/ML injection  Commonly known as:  novoLOG  Inject 4 Units into the skin 3 (three) times daily with meals.     insulin aspart 100 UNIT/ML injection  Commonly known as:  novoLOG  Inject 0-7 Units into the skin 4 (four) times daily -  before meals and at bedtime.     insulin detemir 100 UNIT/ML injection  Commonly known as:  LEVEMIR  Inject 0.14 mLs (14 Units total) into the skin at bedtime.     Insulin Syringe-Needle U-100 31G X 5/16" 0.5 ML Misc  Commonly known as:  B-D INS SYRINGE 0.5CC/31GX5/16  USE THREE TIMES DAILY     isosorbide dinitrate 30 MG tablet  Commonly known as:  ISORDIL  Take  by mouth.     isosorbide mononitrate 30 MG 24 hr tablet  Commonly known as:  IMDUR  Take 30 mg by mouth 2 (two) times daily.     levothyroxine 75 MCG tablet  Commonly known as:  SYNTHROID, LEVOTHROID  Take 1 tablet (75 mcg total) by mouth daily before breakfast.     lisinopril 10 MG tablet  Commonly known as:  PRINIVIL,ZESTRIL  Take 10 mg by mouth daily.     metolazone 5 MG tablet  Commonly known as:  ZAROXOLYN  Take by mouth.     NEURONTIN 300 MG capsule  Generic drug:  gabapentin  TAKE 1 BY MOUTH DAILY AND  1 BY MOUTH 4 HOURS AFTER   DIALYSIS     nitroGLYCERIN 0.4 MG SL tablet  Commonly known as:  NITROSTAT  Place under the tongue.     ondansetron 4 MG disintegrating tablet  Commonly known as:  ZOFRAN ODT  Take 1 tablet (4 mg total) by mouth every 8 (eight) hours as needed for nausea or vomiting.     ondansetron 4 MG tablet  Commonly known as:  ZOFRAN  Take by mouth.     oxyCODONE 5 MG immediate release tablet  Commonly known as:  Oxy IR/ROXICODONE  Take 1 tablet (5 mg total) by mouth every 4 (four) hours as needed for moderate pain.     pantoprazole 40 MG tablet  Commonly known as:  PROTONIX  Take 40 mg by mouth 2 (two) times daily.     PARoxetine 20 MG tablet  Commonly known as:  PAXIL  Take 20 mg by mouth daily.     pregabalin 75 MG capsule  Commonly known as:  LYRICA  Take 150 mg by mouth 2 (two) times daily.     PRILOSEC 40 MG capsule  Generic drug:  omeprazole  TAKE 1 BY MOUTH DAILY     promethazine 25 MG tablet  Commonly known as:  PHENERGAN  Take 1 tablet (25 mg total) by mouth every 6 (six) hours as needed for nausea or vomiting.     rosuvastatin 20 MG tablet  Commonly known as:  CRESTOR  Take 1 tablet (20 mg total) by mouth at bedtime.     sucralfate 1 G tablet  Commonly known as:  CARAFATE  Take by mouth.     sulfamethoxazole-trimethoprim 400-80 MG per tablet  Commonly known as:  BACTRIM,SEPTRA  Take by mouth.     traZODone 50 MG  tablet  Commonly known as:  DESYREL  Take by mouth.     zolpidem 10 MG tablet  Commonly known as:  AMBIEN  Take by mouth.        Allergies:  Allergies  Allergen Reactions  . Hydromorphone   . Amoxicillin-Pot Clavulanate Nausea And Vomiting  . Rifampin Nausea And Vomiting    Family History  Problem Relation Age of Onset  . Heart disease Other   . Hypertension Other   . Hypertension Mother   . Heart disease Mother   . Diabetes Mother   . Birth defects Paternal Uncle     unaware  . Birth defects Paternal Grandmother     breast  . Kidney disease Neg Hx   . Prostate cancer Neg Hx     Social History:  reports that he has never smoked. He quit smokeless tobacco use about 19 years ago. His smokeless tobacco use included Chew. He reports that he does not drink alcohol or use illicit drugs.  ROS: A complete review of systems was performed.  All systems are negative except for pertinent findings as noted.  Physical Exam:  Vital signs in last 24 hours: Temp:  [98.1 F (36.7 C)-98.2 F (36.8 C)] 98.2 F (36.8 C) (08/01 1317) Pulse Rate:  [83-99] 99 (08/01 1317) Resp:  [16] 16 (08/01 1317) BP: (148-160)/(74-82) 160/78 mmHg (08/01 1317) SpO2:  [96 %-97 %] 96 % (08/01 1317) Weight:  [185 lb (83.915 kg)] 185 lb (83.915 kg) (08/01 1317) Constitutional:  Alert and oriented, No acute distress.  Generally unwell appearing male. HEENT: [place] AT, moist mucus membranes.  Trachea midline, no masses Cardiovascular: Regular rate and rhythm, no clubbing, cyanosis, or edema. Respiratory: Normal respiratory effort, lungs clear bilaterally GI: Abdomen is soft, nontender, no abdominal masses.   Chronically distended from abdominal ascites.  SP tube tract with granulation tissue at the site but no surrounding erythema or purulence. GU: No CVA tenderness.  Penoscrotal exam clinically stable from previous occasions, mild penile edema without erythema with  fibrinous drainage from left distal shaft  opening with manipulation. Meatus is widely patent from previous debridement. Scrotum fairly unremarkable today with healing wound at the upper left anterior surface, no fluctuant masses or obvious large collections. No scrotal erythema or drainage. Skin: No rashes, bruises or suspicious lesions.  Left AV fistula in upper extremity noted. Lymph: No cervical or inguinal adenopathy Neurologic: Grossly intact, no focal deficits, moving all 4 extremities Psychiatric:  Normal mood and affect   Laboratory Data:  n/a  Radiologic Imaging: N/a  Procedure:  Patient's SP tube site was prepped in the standard sterile fashion. The SP tube tract site with granulation tissue was lubricated and a Super Stiff wire was able to be passed through this tract into the bladder without difficulty with use of an obturator. Upon placement of the obturator, there was return of light pink urine. The patient was able to feel the wire within his bladder. The tract was then serially dilated using first a 16 French urethral dilator followed by an 31 [place]. A 16 French council tip catheter was then advanced over the wire into the bladder with return of light pink urine, scant amount.  The balloon was inflated with 10 cc of sterile water. The catheter was then irrigated to ensure adequate position with 60 cc of sterile water which both flushed easily into and aspirated out of the bladder.  Impression/Assessment:  44 year old male with chronic penoscrotal abscess, urethrocutaneous fistula following removal of infected penile prosthesis.  Patient's SP tube was dislodged, able to replace today at the bedside without difficulty after serial dilation. He does appear to have a stable chronically infected penoscrotal infection, slightly worse over the past 1-2 weeks but no signs of systemic infection. He did respond well to clindamycin on previous occasions therefore we'll resume this medication until he has his scheduled follow-up at [facility]  urology.  Plan:  #1. SP tube replaced with a 16 [place] council tip catheter without difficulty today #2. Recommend resumption of clindamycin for chronic penoscrotal infection, no signs of sepsis or systemic infection today #3. Patient has been referred to tertiary care center for further evaluation as the scope of his condition is beyond the care that can be provided here at [facility], has apt Aug 17th.    06/10/2015, 4:39 PM  Vanna Scotland,  MD

## 2015-06-10 NOTE — Assessment & Plan Note (Signed)
Symptoms seem to be improving and are compounded by his recently hospitalizations and decline in his health.  Advised no further work up but he wants to see Dr. Patsy Lager given duration of symptoms.  Advised he could make an appointment but I doubt he would have much more to offer. The patient indicates understanding of these issues and agrees with the plan.

## 2015-06-10 NOTE — ED Notes (Signed)
Pt in bed resting. No acute distress at this time. Pts family not at bedside.

## 2015-06-10 NOTE — Discharge Instructions (Signed)
Acute Urinary Retention Acute urinary retention is the temporary inability to urinate. This is a common problem in older men. As men age their prostates become larger and block the flow of urine from the bladder. This is usually a problem that has come on gradually.  HOME CARE INSTRUCTIONS If you are sent home with a Foley catheter and a drainage system, you will need to discuss the best course of action with your health care provider. While the catheter is in, maintain a good intake of fluids. Keep the drainage bag emptied and lower than your catheter. This is so that contaminated urine will not flow back into your bladder, which could lead to a urinary tract infection. There are two main types of drainage bags. One is a large bag that usually is used at night. It has a good capacity that will allow you to sleep through the night without having to empty it. The second type is called a leg bag. It has a smaller capacity, so it needs to be emptied more frequently. However, the main advantage is that it can be attached by a leg strap and can go underneath your clothing, allowing you the freedom to move about or leave your home. Only take over-the-counter or prescription medicines for pain, discomfort, or fever as directed by your health care provider.  SEEK MEDICAL CARE IF:  You develop a low-grade fever.  You experience spasms or leakage of urine with the spasms. SEEK IMMEDIATE MEDICAL CARE IF:   You develop chills or fever.  Your catheter stops draining urine.  Your catheter falls out.  You start to develop increased bleeding that does not respond to rest and increased fluid intake. MAKE SURE YOU:  Understand these instructions.  Will watch your condition.  Will get help right away if you are not doing well or get worse. Document Released: 02/01/2001 Document Revised: 10/31/2013 Document Reviewed: 04/06/2013 [facility] Patient Information 2015 [place], [place]. This information is not  intended to replace advice given to you by your health care provider. Make sure you discuss any questions you have with your health care provider. Suprapubic Catheter Home Guide A suprapubic catheter is a rubber tube with a tiny balloon on the end. It is used to drain urine from the bladder. This catheter is put in your bladder through a small opening in the lower center part of your abdomen. Suprapubic refers to the area right above your pubic bone. The balloon on the end of the catheter is filled with germ-free (sterile) water. This keeps the catheter from slipping out. When the catheter is in place, your urine will drain into a collection bag. The bag can be put beside your bed at night or attached to your leg during the day. HOW TO CARE FOR YOUR CATHETER  Cleaning your skin  Clean the skin around the catheter opening every day.  Wash your hands with soap and water.  Clean the skin around the opening with a clean washcloth and soapy water. Do not pull on the tube.  Pat the area dry with a clean towel.  Your caregiver may want you to put a bandage (dressing) over the site. Do not use ointment on this area unless your caregiver tells you to. Cleaning the catheter  Ask your caregiver if you need to clean the catheter and how often.  Use only soap and water.  There may be crusts on the catheter. Put hydrogen peroxide on a cotton ball or gauze pad to remove any crust.  Emptying the collection bag  You may have a large drainage bag to use at night and a smaller one for daytime. Empty the large bag every 8 hours. Empty the small bag when it is about  full.  Keep the drainage bag below the level of the catheter. This keeps urine from flowing backwards.  Hold the bag over the toilet or another container. Release the valve (spigot) at the bottom of the bag. Do not touch the opening of the spigot. Do not let the opening touch the toilet or container.  Close the spigot tightly when the bag is  empty. Cleaning the collection bag  Clean the bag every few days.  First, wash your hands.  Disconnect the tubing from the catheter. Replace the used bag with a new bag. Then you can clean the used one.  Empty the used bag completely. Rinse it out with warm water and soap or fill the bag with water and add 1 teaspoon of vinegar. Let it sit for about 30 minutes. Then drain.  The bag should be completely dry before storing it. Put it inside a plastic bag to keep it clean. Checking everything  Always make sure there are no kinks in the catheter or tubing.  Always make sure there are no leaks in the catheter, tubing, or collection bag. HOW TO CHANGE YOUR CATHETER Sometimes, a caregiver will change your suprapubic catheter. Other times, you may need to change it yourself. This may be the case if you need to wear a catheter for a long time. Usually, they need to be changed every 4 to 6 weeks. Ask your caregiver how often yours should be changed. Your caregiver will help you order the following supplies for home delivery:  Sterile gloves.  Catheters.  Syringes.  Sterile water.  Sterile cleaning solution.  Lubricant.  Drainage bags. Changing your catheter  Drink plenty of fluids before changing the catheter.  Wash your hands with soap and water.  Lie on your back and put on sterile gloves.  Clean the skin around the catheter opening. Use the sterile cleaning solution.  Use a syringe to get the water out of the balloon from the old catheter.  Slowly remove the catheter.  Take off the first pair of gloves, and put on a new pair. Then put lubricant on the tip of the new catheter. Put the new catheter through the opening.  Wait for some urine to start flowing. Then, use the other syringe to fill the balloon with sterile water.  Attach the catheter to your drainage bag. Make sure the connection is tight. Important warnings  The catheter should come out easily. If it seems  stuck, do not pull it.  Call your caregiver right away if you have any trouble while changing the catheter.  When the old catheter is removed, the new one should be put in right away. This is because the opening will close quickly. If you have a problem, go to an emergency clinic right away. RISKS AND COMPLICATIONS  Urine flow can become blocked. This can happen if the catheter or tubes are not working right. A blood clot can also block urine flow.  The catheter might irritate tissue in your body. This can cause bleeding.  The skin near the opening for the catheter may become irritated or infected.  Bacteria may get into your bladder. This can cause a urinary tract infection. HOME CARE INSTRUCTIONS  Take all medicines prescribed by your caregiver. Follow the directions carefully.  Drink 8 glasses of water every day. This produces good urine flow.  Check the skin around your catheter a few times every day. Watch for redness and swelling. Look for any fluids coming out of the opening.  Do not use powder or cream around the catheter opening.  Do not take tub baths or use pools or hot tubs.  Keep all follow-up appointments. SEEK MEDICAL CARE IF:  You leak urine.  Your skin around the catheter becomes red or sore.  Your urine flow slows down.  Your urine gets cloudy or smelly. SEEK IMMEDIATE MEDICAL CARE IF:   You have chills, nausea, or back pain.  You have trouble changing your catheter.  Your catheter comes out.  You have blood in your urine.  You have no urine flow for 1 hour.  You have a fever. Document Released: 07/14/2011 Document Revised: 01/18/2012 Document Reviewed: 07/14/2011 [facility] Patient Information 2015 [place], [place]. This information is not intended to replace advice given to you by your health care provider. Make sure you discuss any questions you have with your health care provider.

## 2015-06-10 NOTE — Patient Instructions (Signed)
Good to see you. Please schedule an appointment with Dr. Patsy Lager on your way out.
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
## 2015-06-10 NOTE — Progress Notes (Signed)
Pre visit review using our clinic review tool, if applicable. No additional management support is needed unless otherwise documented below in the visit note. 

## 2015-06-13 ENCOUNTER — Emergency Department: Payer: Medicare Other

## 2015-06-13 ENCOUNTER — Emergency Department
Admission: EM | Admit: 2015-06-13 | Discharge: 2015-06-13 | Disposition: A | Discharge status: Other Acute Inpatient Hospital | Payer: Medicare Other | Attending: Emergency Medicine | Admitting: Emergency Medicine

## 2015-06-13 ENCOUNTER — Encounter: Payer: Self-pay | Admitting: Emergency Medicine

## 2015-06-13 DIAGNOSIS — Z880 Allergy status to penicillin: Secondary | ICD-10-CM | POA: Diagnosis not present

## 2015-06-13 DIAGNOSIS — W01198A Fall on same level from slipping, tripping and stumbling with subsequent striking against other object, initial encounter: Secondary | ICD-10-CM | POA: Insufficient documentation

## 2015-06-13 DIAGNOSIS — R7989 Other specified abnormal findings of blood chemistry: Secondary | ICD-10-CM | POA: Diagnosis not present

## 2015-06-13 DIAGNOSIS — Z992 Dependence on renal dialysis: Secondary | ICD-10-CM | POA: Diagnosis not present

## 2015-06-13 DIAGNOSIS — N390 Urinary tract infection, site not specified: Secondary | ICD-10-CM | POA: Diagnosis not present

## 2015-06-13 DIAGNOSIS — M4802 Spinal stenosis, cervical region: Secondary | ICD-10-CM

## 2015-06-13 DIAGNOSIS — M21371 Foot drop, right foot: Secondary | ICD-10-CM | POA: Diagnosis not present

## 2015-06-13 DIAGNOSIS — E1022 Type 1 diabetes mellitus with diabetic chronic kidney disease: Secondary | ICD-10-CM | POA: Insufficient documentation

## 2015-06-13 DIAGNOSIS — Z7982 Long term (current) use of aspirin: Secondary | ICD-10-CM | POA: Insufficient documentation

## 2015-06-13 DIAGNOSIS — Z79899 Other long term (current) drug therapy: Secondary | ICD-10-CM | POA: Insufficient documentation

## 2015-06-13 DIAGNOSIS — M21372 Foot drop, left foot: Secondary | ICD-10-CM | POA: Insufficient documentation

## 2015-06-13 DIAGNOSIS — S4992XA Unspecified injury of left shoulder and upper arm, initial encounter: Secondary | ICD-10-CM | POA: Insufficient documentation

## 2015-06-13 DIAGNOSIS — Z794 Long term (current) use of insulin: Secondary | ICD-10-CM | POA: Diagnosis not present

## 2015-06-13 DIAGNOSIS — R55 Syncope and collapse: Secondary | ICD-10-CM | POA: Insufficient documentation

## 2015-06-13 DIAGNOSIS — Y9301 Activity, walking, marching and hiking: Secondary | ICD-10-CM | POA: Insufficient documentation

## 2015-06-13 DIAGNOSIS — Y9289 Other specified places as the place of occurrence of the external cause: Secondary | ICD-10-CM | POA: Diagnosis not present

## 2015-06-13 DIAGNOSIS — Z043 Encounter for examination and observation following other accident: Secondary | ICD-10-CM | POA: Diagnosis present

## 2015-06-13 DIAGNOSIS — I5033 Acute on chronic diastolic (congestive) heart failure: Secondary | ICD-10-CM | POA: Diagnosis not present

## 2015-06-13 DIAGNOSIS — N186 End stage renal disease: Secondary | ICD-10-CM | POA: Diagnosis not present

## 2015-06-13 DIAGNOSIS — Y998 Other external cause status: Secondary | ICD-10-CM | POA: Insufficient documentation

## 2015-06-13 DIAGNOSIS — I120 Hypertensive chronic kidney disease with stage 5 chronic kidney disease or end stage renal disease: Secondary | ICD-10-CM | POA: Diagnosis not present

## 2015-06-13 DIAGNOSIS — Z792 Long term (current) use of antibiotics: Secondary | ICD-10-CM | POA: Insufficient documentation

## 2015-06-13 DIAGNOSIS — S0081XA Abrasion of other part of head, initial encounter: Secondary | ICD-10-CM | POA: Insufficient documentation

## 2015-06-13 DIAGNOSIS — R778 Other specified abnormalities of plasma proteins: Secondary | ICD-10-CM

## 2015-06-13 LAB — TROPONIN I: TROPONIN I: 0.09 ng/mL — AB (ref <0.031)

## 2015-06-13 LAB — COMPREHENSIVE METABOLIC PANEL
ALT: 14 U/L — ABNORMAL LOW (ref 17–63)
AST: 19 U/L (ref 15–41)
Albumin: 2.3 g/dL — ABNORMAL LOW (ref 3.5–5.0)
Alkaline Phosphatase: 330 U/L — ABNORMAL HIGH (ref 38–126)
Anion gap: 13 (ref 5–15)
BUN: 26 mg/dL — ABNORMAL HIGH (ref 6–20)
CALCIUM: 8.3 mg/dL — AB (ref 8.9–10.3)
CO2: 30 mmol/L (ref 22–32)
Chloride: 92 mmol/L — ABNORMAL LOW (ref 101–111)
Creatinine, Ser: 3.06 mg/dL — ABNORMAL HIGH (ref 0.61–1.24)
GFR, EST AFRICAN AMERICAN: 27 mL/min — AB (ref >60)
GFR, EST NON AFRICAN AMERICAN: 23 mL/min — AB (ref >60)
GLUCOSE: 139 mg/dL — AB (ref 65–99)
POTASSIUM: 3.5 mmol/L (ref 3.5–5.1)
Sodium: 135 mmol/L (ref 135–145)
Total Bilirubin: 0.4 mg/dL (ref 0.3–1.2)
Total Protein: 6.8 g/dL (ref 6.5–8.1)

## 2015-06-13 LAB — CBC WITH DIFFERENTIAL/PLATELET
Basophils Absolute: 0.1 10*3/uL (ref 0–0.1)
Basophils Relative: 1 %
EOS ABS: 0.3 10*3/uL (ref 0–0.7)
Eosinophils Relative: 3 %
HCT: 23.5 % — ABNORMAL LOW (ref 40.0–52.0)
HEMOGLOBIN: 7.8 g/dL — AB (ref 13.0–18.0)
LYMPHS PCT: 6 %
Lymphs Abs: 0.5 10*3/uL — ABNORMAL LOW (ref 1.0–3.6)
MCH: 27.7 pg (ref 26.0–34.0)
MCHC: 33.1 g/dL (ref 32.0–36.0)
MCV: 83.7 fL (ref 80.0–100.0)
Monocytes Absolute: 0.6 10*3/uL (ref 0.2–1.0)
Monocytes Relative: 7 %
Neutro Abs: 7.3 10*3/uL — ABNORMAL HIGH (ref 1.4–6.5)
Neutrophils Relative %: 83 %
Platelets: 277 10*3/uL (ref 150–440)
RBC: 2.8 MIL/uL — AB (ref 4.40–5.90)
RDW: 18.4 % — ABNORMAL HIGH (ref 11.5–14.5)
WBC: 8.8 10*3/uL (ref 3.8–10.6)

## 2015-06-13 LAB — URINALYSIS COMPLETE WITH MICROSCOPIC (ARMC ONLY)
BACTERIA UA: NONE SEEN
Bilirubin Urine: NEGATIVE
Glucose, UA: 150 mg/dL — AB
Ketones, ur: NEGATIVE mg/dL
NITRITE: NEGATIVE
PH: 9 — AB (ref 5.0–8.0)
PROTEIN: 100 mg/dL — AB
SPECIFIC GRAVITY, URINE: 1.012 (ref 1.005–1.030)
SQUAMOUS EPITHELIAL / LPF: NONE SEEN

## 2015-06-13 LAB — GLUCOSE, CAPILLARY: Glucose-Capillary: 142 mg/dL — ABNORMAL HIGH (ref 65–99)

## 2015-06-13 LAB — BRAIN NATRIURETIC PEPTIDE: B Natriuretic Peptide: 1655 pg/mL — ABNORMAL HIGH (ref 0.0–100.0)

## 2015-06-13 MED ORDER — MORPHINE SULFATE 4 MG/ML IJ SOLN
4.0000 mg | Freq: Once | INTRAMUSCULAR | Status: AC
  Administered 2015-06-13: 4 mg via INTRAVENOUS
  Filled 2015-06-13: qty 1

## 2015-06-13 MED ORDER — MORPHINE SULFATE 4 MG/ML IJ SOLN
INTRAMUSCULAR | Status: AC
  Filled 2015-06-13: qty 1

## 2015-06-13 MED ORDER — ONDANSETRON HCL 4 MG/2ML IJ SOLN
4.0000 mg | Freq: Once | INTRAMUSCULAR | Status: AC
  Administered 2015-06-13: 4 mg via INTRAVENOUS
  Filled 2015-06-13: qty 2

## 2015-06-13 MED ORDER — MORPHINE SULFATE 4 MG/ML IJ SOLN
4.0000 mg | Freq: Once | INTRAMUSCULAR | Status: AC
  Administered 2015-06-13: 4 mg via INTRAVENOUS
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
## 2015-06-13 NOTE — ED Notes (Signed)
Repositioned in bed.

## 2015-06-13 NOTE — ED Notes (Signed)
Patient changed.  Skin care given. Tolerated well.

## 2015-06-13 NOTE — ED Notes (Signed)
MD at bedside.Dr.MaLinda

## 2015-06-13 NOTE — ED Notes (Signed)
Walking into house with walker after dialysis.  Patient states "my legs just gave out" and fell backward onto concrete hitting head.,  Patient denies LOC.

## 2015-06-13 NOTE — ED Notes (Signed)
c-collar removed

## 2015-06-19 ENCOUNTER — Encounter: Payer: Self-pay | Admitting: Family Medicine

## 2015-06-19 ENCOUNTER — Ambulatory Visit (INDEPENDENT_AMBULATORY_CARE_PROVIDER_SITE_OTHER): Payer: Medicare Other | Admitting: Family Medicine

## 2015-06-19 VITALS — BP 164/86 | HR 87 | Temp 97.8°F

## 2015-06-19 DIAGNOSIS — M4802 Spinal stenosis, cervical region: Secondary | ICD-10-CM | POA: Diagnosis not present

## 2015-06-19 DIAGNOSIS — N186 End stage renal disease: Secondary | ICD-10-CM

## 2015-06-19 DIAGNOSIS — I272 Other secondary pulmonary hypertension: Secondary | ICD-10-CM

## 2015-06-19 DIAGNOSIS — I2510 Atherosclerotic heart disease of native coronary artery without angina pectoris: Secondary | ICD-10-CM | POA: Diagnosis not present

## 2015-06-19 DIAGNOSIS — I2729 Other secondary pulmonary hypertension: Secondary | ICD-10-CM

## 2015-06-19 DIAGNOSIS — E1022 Type 1 diabetes mellitus with diabetic chronic kidney disease: Secondary | ICD-10-CM

## 2015-06-19 DIAGNOSIS — I7774 Dissection of vertebral artery: Secondary | ICD-10-CM

## 2015-06-19 DIAGNOSIS — J9611 Chronic respiratory failure with hypoxia: Secondary | ICD-10-CM

## 2015-06-19 DIAGNOSIS — I252 Old myocardial infarction: Secondary | ICD-10-CM | POA: Diagnosis not present

## 2015-06-19 DIAGNOSIS — I270 Primary pulmonary hypertension: Secondary | ICD-10-CM

## 2015-06-19 DIAGNOSIS — Z992 Dependence on renal dialysis: Secondary | ICD-10-CM

## 2015-06-19 MED ORDER — HYDROCODONE-ACETAMINOPHEN 5-325 MG PO TABS: 1.0000 | ORAL_TABLET | Freq: Four times a day (QID) | ORAL | Status: DC | PRN

## 2015-06-19 NOTE — Patient Instructions (Signed)

## 2015-06-19 NOTE — Progress Notes (Signed)
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 30 mLs by mouth every 4 (four) hours as needed for indigestion.    AMITRIPTYLINE (ELAVIL) 25 MG TABLET    Take 1 tablet (25 mg total) by mouth at bedtime.   ASPIRIN 81 MG EC TABLET    Take 81 mg by mouth daily.    CALCIUM CARBONATE (TUMS - DOSED IN MG ELEMENTAL CALCIUM) 500 MG CHEWABLE TABLET    Chew 1-2 tablets (200-400 mg of elemental calcium total) by mouth 2 (two) times daily as needed for indigestion or heartburn.   CALCIUM-VITAMIN D (OSCAL WITH D) 500-200 MG-UNIT PER TABLET    Take 1 tablet by mouth 2 (  two) times daily.   CARVEDILOL (COREG) 25 MG TABLET    Take 1 tablet (25 mg total) by mouth 2 (two) times daily with a meal.   CINACALCET (SENSIPAR) 60 MG TABLET    Take 60 mg by mouth daily.    CLINDAMYCIN (CLEOCIN) 300 MG CAPSULE    Take 1 capsule (300 mg total) by mouth 3 (three) times daily.   CLONIDINE (CATAPRES) 0.1 MG TABLET    Take 0.1 mg by mouth 2 (two) times daily.   DHA-EPA-VITAMIN E PO    Take 1 capsule by mouth daily.   FUROSEMIDE (LASIX) 80 MG TABLET    Take 80 mg by mouth 2 (two) times daily.   INSULIN ASPART (NOVOLOG) 100 UNIT/ML INJECTION    Inject 0-7  Units into the skin 4 (four) times daily -  before meals and at bedtime.   INSULIN DETEMIR (LEVEMIR) 100 UNIT/ML INJECTION    Inject 0.14 mLs (14 Units total) into the skin at bedtime.   ISOSORBIDE DINITRATE (ISORDIL) 30 MG TABLET    Take 30 mg by mouth daily.   LEVOTHYROXINE (SYNTHROID, LEVOTHROID) 75 MCG TABLET    Take 1 tablet (75 mcg total) by mouth daily before breakfast.   LISINOPRIL (PRINIVIL,ZESTRIL) 10 MG TABLET    Take 10 mg by mouth daily.   NITROGLYCERIN (NITROSTAT) 0.4 MG SL TABLET    Place 0.4 mg under the tongue every 5 (five) minutes as needed for chest pain.   OMEGA-3 FATTY ACIDS (FISH OIL) 1000 MG CAPS    Take 1 capsule by mouth daily.   ONDANSETRON (ZOFRAN ODT) 4 MG DISINTEGRATING TABLET    Take 1 tablet (4 mg total) by mouth every 8 (eight) hours as needed for nausea or vomiting.   OXYCODONE (OXY IR/ROXICODONE) 5 MG IMMEDIATE RELEASE TABLET    Take 1 tablet (5 mg total) by mouth every 4 (four) hours as needed for moderate pain.   PANTOPRAZOLE (PROTONIX) 40 MG TABLET    Take 40 mg by mouth 2 (two) times daily.   PAROXETINE (PAXIL) 20 MG TABLET    Take 20 mg by mouth daily.    PREGABALIN (LYRICA) 75 MG CAPSULE    Take 150 mg by mouth 2 (two) times daily.   PRILOSEC 40 MG CAPSULE    TAKE 1 BY MOUTH DAILY   PROMETHAZINE (PHENERGAN) 25 MG TABLET    Take 1 tablet (25 mg total) by mouth every 6 (six) hours as needed for nausea or vomiting.   ROSUVASTATIN (CRESTOR) 20 MG TABLET    Take 1 tablet (20 mg total) by mouth at bedtime.   SUCRALFATE (CARAFATE) 1 G TABLET    Take 1 g by mouth 4 (four) times daily -  with meals and at bedtime.   TRAZODONE (DESYREL) 50 MG TABLET    Take 75 mg by mouth at bedtime.  Modified Medications   Modified Medication Previous Medication   HYDROCODONE-ACETAMINOPHEN (NORCO/VICODIN) 5-325 MG PER TABLET HYDROcodone-acetaminophen (NORCO/VICODIN) 5-325 MG per tablet      Take 1 tablet by mouth every 6 (six) hours as needed for moderate pain.    Take 1 tablet by  mouth every 6 (six) hours as needed for moderate pain.  Discontinued Medications   INSULIN ASPART (NOVOLOG) 100 UNIT/ML INJECTION    Inject 4 Units into the skin 3 (three) times daily with meals.   NEURONTIN 300 MG CAPSULE    TAKE 1 BY MOUTH DAILY AND  1 BY MOUTH 4 HOURS AFTER  DIALYSIS

## 2015-06-19 NOTE — Progress Notes (Signed)
Pre visit review using our clinic review tool, if applicable. No additional management support is needed unless otherwise documented below in the visit note. 

## 2015-07-02 ENCOUNTER — Encounter: Payer: Self-pay | Admitting: *Deleted

## 2015-07-02 ENCOUNTER — Ambulatory Visit: Payer: Medicare Other | Admitting: Cardiovascular Disease

## 2015-07-02 ENCOUNTER — Other Ambulatory Visit: Payer: Self-pay

## 2015-07-02 NOTE — Telephone Encounter (Signed)
Angelique Blonder with [facility] request refill on clonidine and levemir; spoke ryan with ref # [id number] and advised per 10/10/14 office note pt followed by endo and pt also seeing cardiologist for pulmonary HTN. Ryan voiced understanding and will contact pt.

## 2015-07-08 ENCOUNTER — Encounter: Payer: Self-pay | Admitting: Cardiovascular Disease

## 2015-07-08 ENCOUNTER — Ambulatory Visit (INDEPENDENT_AMBULATORY_CARE_PROVIDER_SITE_OTHER): Payer: Medicare Other | Admitting: Cardiovascular Disease

## 2015-07-08 VITALS — BP 130/68 | HR 76 | Ht 75.0 in | Wt 185.0 lb

## 2015-07-08 DIAGNOSIS — I272 Other secondary pulmonary hypertension: Secondary | ICD-10-CM | POA: Diagnosis not present

## 2015-07-08 DIAGNOSIS — Z992 Dependence on renal dialysis: Principal | ICD-10-CM

## 2015-07-08 DIAGNOSIS — N186 End stage renal disease: Secondary | ICD-10-CM

## 2015-07-08 DIAGNOSIS — I2510 Atherosclerotic heart disease of native coronary artery without angina pectoris: Secondary | ICD-10-CM | POA: Diagnosis not present

## 2015-07-08 DIAGNOSIS — I2729 Other secondary pulmonary hypertension: Secondary | ICD-10-CM

## 2015-07-08 NOTE — Progress Notes (Signed)
HPI  This is a 44 year old male who is here today for a followup visit. He has known history of three-vessel coronary artery disease diagnosed in 2010. He underwent coronary artery bypass graft surgery in December of 2010 at [facility]. He has multiple chronic conditions that include end-stage renal disease on hemodialysis followed by Dr. Thedore Mins, diabetes, chronic diastolic heart failure, severe pulmonary hypertension, chronic urinary retention, hypertension and hyperlipidemia. He also has peripheral arterial disease managed by Dr. Gilda Crease. He is status post right toes amputation . He underwent a right and left cardiac cath in 12/2012 which showed patent grafts. There was moderately elevated PCWP and moderate pulmonary hypertension. EF was normal by echo.  He had multiple admissions over the last year due to complications related to fluid overload, infections and other issues related to diabetes. He also has urinary retention and currently has a Foley catheter in place. He has been noted to have worsening pulmonary hypertension and tricuspid regurgitation. Thus, he underwent a right heart catheterization last month which showed a mean pulmonary pressure of 47 mmHg with a pulmonary capillary wedge pressure of 20 mmHg with some features suggestive of restrictive physiology.  Allergies  Allergen Reactions  . Amoxicillin-Pot Clavulanate Nausea And Vomiting  . Rifampin Nausea And Vomiting     Current Outpatient Prescriptions on File Prior to Visit  Medication Sig Dispense Refill  . acetaminophen (TYLENOL) 500 MG tablet Take 1,000 mg by mouth every 6 (six) hours as needed for mild pain or headache.     . ALPRAZolam (XANAX) 0.25 MG tablet Take 1 tablet (0.25 mg total) by mouth every 6 (six) hours as needed for anxiety. 5 tablet 0  . alum & mag hydroxide-simeth (MAALOX PLUS) 400-400-40 MG/5ML suspension Take 30 mLs by mouth every 4 (four) hours as needed for indigestion.     [name] amitriptyline (ELAVIL) 25 MG  tablet Take 1 tablet (25 mg total) by mouth at bedtime. 30 tablet 1  . aspirin 81 MG EC tablet Take 81 mg by mouth daily.     . calcium carbonate (TUMS - DOSED IN MG ELEMENTAL CALCIUM) 500 MG chewable tablet Chew 1-2 tablets (200-400 mg of elemental calcium total) by mouth 2 (two) times daily as needed for indigestion or heartburn. 1 tablet 0  . calcium-vitamin D (OSCAL WITH D) 500-200 MG-UNIT per tablet Take 1 tablet by mouth 2 (two) times daily.    . carvedilol (COREG) 25 MG tablet Take 1 tablet (25 mg total) by mouth 2 (two) times daily with a meal. 90 tablet 0  . cinacalcet (SENSIPAR) 60 MG tablet Take 60 mg by mouth daily.     . clindamycin (CLEOCIN) 300 MG capsule Take 1 capsule (300 mg total) by mouth 3 (three) times daily. 30 capsule 0  . cloNIDine (CATAPRES) 0.1 MG tablet Take 0.1 mg by mouth 2 (two) times daily.    . DHA-EPA-VITAMIN E PO Take 1 capsule by mouth daily.    . furosemide (LASIX) 80 MG tablet Take 80 mg by mouth 2 (two) times daily.    [name] HYDROcodone-acetaminophen (NORCO/VICODIN) 5-325 MG per tablet Take 1 tablet by mouth every 6 (six) hours as needed for moderate pain. 30 tablet 0  . insulin aspart (NOVOLOG) 100 UNIT/ML injection Inject 0-7 Units into the skin 4 (four) times daily -  before meals and at bedtime. 10 mL 11  . insulin detemir (LEVEMIR) 100 UNIT/ML injection Inject 0.14 mLs (14 Units total) into the skin at bedtime. 10 mL 11  .  isosorbide dinitrate (ISORDIL) 30 MG tablet Take 30 mg by mouth daily.    [name] levothyroxine (SYNTHROID, LEVOTHROID) 75 MCG tablet Take 1 tablet (75 mcg total) by mouth daily before breakfast. 90 tablet 1  . lisinopril (PRINIVIL,ZESTRIL) 10 MG tablet Take 10 mg by mouth daily.    . nitroGLYCERIN (NITROSTAT) 0.4 MG SL tablet Place 0.4 mg under the tongue every 5 (five) minutes as needed for chest pain.    . Omega-3 Fatty Acids (FISH OIL) 1000 MG CAPS Take 1 capsule by mouth daily.    . ondansetron (ZOFRAN ODT) 4 MG disintegrating tablet Take 1  tablet (4 mg total) by mouth every 8 (eight) hours as needed for nausea or vomiting. 20 tablet 0  . oxyCODONE (OXY IR/ROXICODONE) 5 MG immediate release tablet Take 1 tablet (5 mg total) by mouth every 4 (four) hours as needed for moderate pain. 5 tablet 0  . pantoprazole (PROTONIX) 40 MG tablet Take 40 mg by mouth 2 (two) times daily.    [name] PARoxetine (PAXIL) 20 MG tablet Take 20 mg by mouth daily.     . pregabalin (LYRICA) 75 MG capsule Take 150 mg by mouth 2 (two) times daily.    [name] PRILOSEC 40 MG capsule TAKE 1 BY MOUTH DAILY 90 capsule 2  . promethazine (PHENERGAN) 25 MG tablet Take 1 tablet (25 mg total) by mouth every 6 (six) hours as needed for nausea or vomiting. 30 tablet 0  . rosuvastatin (CRESTOR) 20 MG tablet Take 1 tablet (20 mg total) by mouth at bedtime. 30 tablet 5  . sucralfate (CARAFATE) 1 G tablet Take 1 g by mouth 4 (four) times daily -  with meals and at bedtime.    . traZODone (DESYREL) 50 MG tablet Take 75 mg by mouth at bedtime.     No current facility-administered medications on file prior to visit.     Past Medical History  Diagnosis Date  . End stage renal disease on dialysis     LUE fistula  . Type I diabetes mellitus     a. 03/2014 admitted with HNK to [facility]. b. TTS  . Diabetic neuropathy     severe, s/p multiple toe amputation  . Hypothyroidism   . Hypertension   . Hyperlipidemia   . H/O hiatal hernia   . GERD (gastroesophageal reflux disease)   . Anxiety   . Sebaceous cyst     side of neck  . Pneumonia     2010  . Anemia     a. req PRBC's 2011.  [name] PAD (peripheral artery disease)     a. s/p amputation of toes on the right;  b. left LE claudication.  . Coronary artery disease     a. s/p MI;  b. 10/2009 CABG x 3 @ [facility]: LIMA->LAD, VG->OM3, VG->RPDA; c. 11/2010 Cath 3/3 patent grafts;  d 12/2012 Cath: LM 30d, LAD 85p, D1 70, D2 90, LCX 40ost, OM2 100, RCA 90p, 134m, L->LAD ok, VG->OM3 ok, VG->RPDA 30, EF 50%->Med Rx.  . Cataract     right  . Valvular  disease     a. 11/2012 Echo: EF 55-60%, mild LVH, mild MR, mild bi-atrial enlargement, mild-mod TR, PASP ; b. echo 03/2014: EF 50-55%, nl WM, select images concerning for bicuspid aortic valve w/ nl thicknes of leaflets, mild MR, mild bi-atrial dilatation, RV mild dilatation - wall thickness nl, mod TR - select images appears to be mod to sev, PASP at least mod elevated.  . Diastolic CHF  see echo above  . Depression   . Arthritis     rheumatoid arthritis   . Myocardial infarction 2010  . Pulmonary HTN     a. continuation of 03/2014 echo PASP @ least mod elevated. RVSP 52 mm Hg. Parasternal long axis estimated @ 85 mm Hg  . Scrotal abscess     a. s/p multiple I&D  . Penile abscess     a. s/p multiple I&D  . Hematemesis     a. EGD 2016 with LA Grade C esophagitis, continued on Protonix   . Chronic respiratory failure     a. on 3L oxygen via nasal cannula  . Anxiety   . Acute delirium   . Sepsis   . Urethrocutaneous fistula in male      Past Surgical History  Procedure Laterality Date  . Dialysis fistula creation      left upper arm fistula  . Amputation      TOES ON BOTH FEET  . Abscess drainage  Behind right ear/occipital scalp  . Skin graft    . Eye surgery  1999  . Coronary artery bypass graft  10/2009    [facility] (Dr. Katrinka Blazing)  . Foot amputation    . Cardiac catheterization    . Cardiac catheterization  2/14    [facility]: severe 3 vessel CAD with patent grafts, RHC: moderately elevated PCW and pulmonary hypertension  . Penile prosthesis implant N/A 07/24/2014    Procedure: SALINE PENILE INJECTION WITH DISSECTION OF CORPORA ,  IMPLANTATION OF COLOPLAST PENILE PROTHESIS INFLATABLE;  Surgeon: [name], MD;  Location: [facility] ORS;  Service: Urology;  Laterality: N/A;  . Removal of penile prosthesis N/A 08/22/2014    Procedure: EXPLANT OF INFECTED  PENILE PROSTHESIS;  Surgeon: [name], MD;  Location: [facility] ORS;  Service: Urology;  Laterality: N/A;  . Irrigation and  debridement abscess Bilateral 12/14/2014    Procedure: Bilateral Corporal Irrigation with Cultures and Drainage, Penrose Drain Insertion;  Surgeon: [name], MD;  Location: [facility] OR;  Service: Urology;  Laterality: Bilateral;  . Scrotal exploration N/A 02/03/2015    Procedure: IRRIGATION AND DEBRIDEMENT SCROTAL/PENILE ABSCESS;  Surgeon: [name], MD;  Location: [facility] OR;  Service: Urology;  Laterality: N/A;  . Cystoscopy N/A 02/03/2015    Procedure: CYSTOSCOPY;  Surgeon: [name], MD;  Location: [facility] OR;  Service: Urology;  Laterality: N/A;  . Esophagogastroduodenoscopy N/A 03/18/2015    Procedure: ESOPHAGOGASTRODUODENOSCOPY (EGD);  Surgeon: [name], MD;  Location: [facility] ENDOSCOPY;  Service: Endoscopy;  Laterality: N/A;  . Cardiac catheterization N/A 05/27/2015    Procedure: Right Heart Cath;  Surgeon: [name], MD;  Location: [facility] INVASIVE CV LAB;  Service: Cardiovascular;  Laterality: N/A;     Family History  Problem Relation Age of Onset  . Heart disease Other   . Hypertension Other   . Hypertension Mother   . Heart disease Mother   . Diabetes Mother   . Birth defects Paternal Uncle     unaware  . Birth defects Paternal Grandmother     breast  . Kidney disease Neg Hx   . Prostate cancer Neg Hx      Social History   Social History  . Marital Status: Single    Spouse Name: N/A  . Number of Children: N/A  . Years of Education: N/A   Occupational History  . Not on file.   Social History Main Topics  . Smoking status: Never Smoker   . Smokeless  tobacco: Former Neurosurgeon    Types: Chew    Quit date: 08/23/1995  . Alcohol Use: No     Comment: occasional beer  . Drug Use: No  . Sexual Activity: Not Currently   Other Topics Concern  . Not on file   Social History Narrative   He is a DNR- yellow sheet given to pt today.     PHYSICAL EXAM   BP 130/68 mmHg  Pulse 76  Ht 6\' 3"  (1.905 m)  Wt 185 lb (83.915 kg)  BMI 23.12 kg/m2 Constitutional:  He is oriented to person, place, and time. He appears well-developed and well-nourished. No distress.  HENT: No nasal discharge.  Head: Normocephalic and atraumatic.  Eyes: Pupils are equal and round. Right eye exhibits no discharge. Left eye exhibits no discharge.  Neck: Normal range of motion. Neck supple.  JVD present. No thyromegaly present.  Cardiovascular: Normal rate, regular rhythm, normal heart sounds absent distal pulses. Exam reveals no gallop and no friction rub. There is a 2/6 holosystolic murmur at the left sternal border .A loud bruit is here in the left upper chest area and left carotid artery area which seems to be due to his left arm fistula. Pulmonary/Chest: Effort normal and breath sounds normal. No stridor. No respiratory distress. He has no wheezes. He has no rales. He exhibits no tenderness.  Abdominal: Soft. Bowel sounds are normal. He exhibits no distension. There is no tenderness. There is no rebound and no guarding.  Musculoskeletal: Normal range of motion. He exhibits trace edema and no tenderness.  Neurological: He is alert and oriented to person, place, and time. Coordination normal.  Skin: Skin is warm and dry. No rash noted. He is not diaphoretic. No erythema. No pallor.  Psychiatric: He has a normal mood and affect. His behavior is normal. Judgment and thought content normal.     ASSESSMENT AND PLAN

## 2015-07-08 NOTE — Patient Instructions (Signed)
Medication Instructions: Continue same medications.   Labwork: None.   Procedures/Testing: None.   Follow-Up: 6 months with Dr. Arida.   Any Additional Special Instructions Will Be Listed Below (If Applicable).   

## 2015-07-09 NOTE — Assessment & Plan Note (Signed)
He has no convincing symptoms of angina. I recommend continuing medical therapy.

## 2015-07-09 NOTE — Assessment & Plan Note (Signed)
The patient has severe pulmonary hypertension which seems to be out of proportion to chronic diastolic heart failure. His wedge pressure was 20 mmHg. I considered adding a phosphodiesterase 5 inhibitor such as sildenafil. However, the patient is on multiple medications including long acting nitroglycerin with potential for interactions. Thus, I recommend continuing current medications. I have followed Taylor [name] for many years and it appears that he is having gradual decline in his overall health and functional capacity related to diabetes, end-stage renal disease and extensive atherosclerosis. I think his overall prognosis is poor.

## 2015-07-26 ENCOUNTER — Telehealth: Payer: Self-pay | Admitting: *Deleted

## 2015-07-26 NOTE — Telephone Encounter (Signed)
Fax sent to [employer] on 07/26/15

## 2015-08-09 ENCOUNTER — Ambulatory Visit (INDEPENDENT_AMBULATORY_CARE_PROVIDER_SITE_OTHER): Payer: Medicare Other | Admitting: Urology

## 2015-08-09 ENCOUNTER — Encounter: Payer: Self-pay | Admitting: Urology

## 2015-08-09 VITALS — BP 170/84 | HR 79 | Ht 75.0 in | Wt 185.0 lb

## 2015-08-09 DIAGNOSIS — I2510 Atherosclerotic heart disease of native coronary artery without angina pectoris: Secondary | ICD-10-CM | POA: Diagnosis not present

## 2015-08-09 DIAGNOSIS — Z435 Encounter for attention to cystostomy: Secondary | ICD-10-CM

## 2015-08-09 NOTE — Progress Notes (Signed)
10:10 PM   Taylor [name] 1971/05/04 [id number]  Referring provider: Dianne Dun, MD [address] [place], [place] [address]  Chief Complaint  Patient presents with  . Urinary Retention    Suprapubic tube change     HPI: Taylor [name] is a 44 year old who presents today for SPT exchange. He has no complaints at this time.  He is having adequate output through the suprapubic tube.  He is having minimal urine output through his penis.  PMH: Past Medical History  Diagnosis Date  . End stage renal disease on dialysis     LUE fistula  . Type I diabetes mellitus     a. 03/2014 admitted with HNK to [facility]. b. TTS  . Diabetic neuropathy     severe, s/p multiple toe amputation  . Hypothyroidism   . Hypertension   . Hyperlipidemia   . H/O hiatal hernia   . GERD (gastroesophageal reflux disease)   . Anxiety   . Sebaceous cyst     side of neck  . Pneumonia     2010  . Anemia     a. req PRBC's 2011.  [name] PAD (peripheral artery disease)     a. s/p amputation of toes on the right;  b. left LE claudication.  . Coronary artery disease     a. s/p MI;  b. 10/2009 CABG x 3 @ [facility]: LIMA->LAD, VG->OM3, VG->RPDA; c. 11/2010 Cath 3/3 patent grafts;  d 12/2012 Cath: LM 30d, LAD 85p, D1 70, D2 90, LCX 40ost, OM2 100, RCA 90p, 162m, L->LAD ok, VG->OM3 ok, VG->RPDA 30, EF 50%->Med Rx.  . Cataract     right  . Valvular disease     a. 11/2012 Echo: EF 55-60%, mild LVH, mild MR, mild bi-atrial enlargement, mild-mod TR, PASP ; b. echo 03/2014: EF 50-55%, nl WM, select images concerning for bicuspid aortic valve w/ nl thicknes of leaflets, mild MR, mild bi-atrial dilatation, RV mild dilatation - wall thickness nl, mod TR - select images appears to be mod to sev, PASP at least mod elevated.  . Diastolic CHF     see echo above  . Depression   . Arthritis     rheumatoid arthritis   . Myocardial infarction 2010  . Pulmonary HTN     a. continuation of 03/2014 echo PASP @ least mod  elevated. RVSP 52 mm Hg. Parasternal long axis estimated @ 85 mm Hg  . Scrotal abscess     a. s/p multiple I&D  . Penile abscess     a. s/p multiple I&D  . Hematemesis     a. EGD 2016 with LA Grade C esophagitis, continued on Protonix   . Chronic respiratory failure     a. on 3L oxygen via nasal cannula  . Anxiety   . Acute delirium   . Sepsis   . 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 MYOCARDIAL INFARCTION, HX OF 01/26/2011    Qualifier: Diagnosis of  By: [name] MD, [name]    . HTN (hypertension) 02/11/2011  . Pulmonary hypertension associated with end stage renal disease on dialysis 04/16/2014    Surgical History: Past Surgical History  Procedure Laterality Date  . Dialysis fistula creation      left upper arm fistula  . Amputation      TOES ON BOTH FEET  . Abscess drainage  Behind right ear/occipital scalp  . Skin graft    . Eye surgery  1999  . Coronary  artery bypass graft  10/2009    [facility] (Dr. Katrinka Blazing)  . Foot amputation    . Cardiac catheterization    . Cardiac catheterization  2/14    [facility]: severe 3 vessel CAD with patent grafts, RHC: moderately elevated PCW and pulmonary hypertension  . Penile prosthesis implant N/A 07/24/2014    Procedure: SALINE PENILE INJECTION WITH DISSECTION OF CORPORA ,  IMPLANTATION OF COLOPLAST PENILE PROTHESIS INFLATABLE;  Surgeon: [name], MD;  Location: [facility] ORS;  Service: Urology;  Laterality: N/A;  . Removal of penile prosthesis N/A 08/22/2014    Procedure: EXPLANT OF INFECTED  PENILE PROSTHESIS;  Surgeon: [name], MD;  Location: [facility] ORS;  Service: Urology;  Laterality: N/A;  . Irrigation and debridement abscess Bilateral 12/14/2014    Procedure: Bilateral Corporal Irrigation with Cultures and Drainage, Penrose Drain Insertion;  Surgeon: [name], MD;  Location: [facility] OR;  Service: Urology;  Laterality: Bilateral;  . Scrotal exploration N/A 02/03/2015    Procedure: IRRIGATION AND DEBRIDEMENT SCROTAL/PENILE ABSCESS;   Surgeon: [name], MD;  Location: [facility] OR;  Service: Urology;  Laterality: N/A;  . Cystoscopy N/A 02/03/2015    Procedure: CYSTOSCOPY;  Surgeon: [name], MD;  Location: [facility] OR;  Service: Urology;  Laterality: N/A;  . Esophagogastroduodenoscopy N/A 03/18/2015    Procedure: ESOPHAGOGASTRODUODENOSCOPY (EGD);  Surgeon: [name], MD;  Location: [facility] ENDOSCOPY;  Service: Endoscopy;  Laterality: N/A;  . Cardiac catheterization N/A 05/27/2015    Procedure: Right Heart Cath;  Surgeon: [name], MD;  Location: [facility] INVASIVE CV LAB;  Service: Cardiovascular;  Laterality: N/A;    Home Medications:    Medication List       This list is accurate as of: 08/09/15 11:59 PM.  Always use your most recent med list.               acetaminophen 500 MG tablet  Commonly known as:  TYLENOL  Take 1,000 mg by mouth every 6 (six) hours as needed for mild pain or headache.     ALPRAZolam 0.25 MG tablet  Commonly known as:  XANAX  Take 1 tablet (0.25 mg total) by mouth every 6 (six) hours as needed for anxiety.     alum & mag hydroxide-simeth 400-400-40 MG/5ML suspension  Commonly known as:  MAALOX PLUS  Take 30 mLs by mouth every 4 (four) hours as needed for indigestion.     amitriptyline 25 MG tablet  Commonly known as:  ELAVIL  Take 1 tablet (25 mg total) by mouth at bedtime.     aspirin EC 81 MG tablet  Take 81 mg by mouth.     calcium carbonate 500 MG chewable tablet  Commonly known as:  TUMS - dosed in mg elemental calcium  Chew 1-2 tablets (200-400 mg of elemental calcium total) by mouth 2 (two) times daily as needed for indigestion or heartburn.     calcium-vitamin D 500-200 MG-UNIT tablet  Commonly known as:  OSCAL WITH D  Take 1 tablet by mouth 2 (two) times daily.     carvedilol 25 MG tablet  Commonly known as:  COREG  Take 1 tablet (25 mg total) by mouth 2 (two) times daily with a meal.     cinacalcet 60 MG tablet  Commonly known as:  SENSIPAR  Take 60 mg by  mouth daily.     clindamycin 300 MG capsule  Commonly known as:  CLEOCIN  Take 1 capsule (300 mg total) by mouth 3 (three)  times daily.     cloNIDine 0.1 MG tablet  Commonly known as:  CATAPRES  Take 0.1 mg by mouth 2 (two) times daily.     DHA-EPA-VITAMIN E PO  Take 1 capsule by mouth daily.     Fish Oil 1000 MG Caps  Take 1 capsule by mouth daily.     furosemide 80 MG tablet  Commonly known as:  LASIX  Take 80 mg by mouth 2 (two) times daily.     HYDROcodone-acetaminophen 5-325 MG tablet  Commonly known as:  NORCO/VICODIN  Take 1 tablet by mouth every 6 (six) hours as needed for moderate pain.     insulin aspart 100 UNIT/ML injection  Commonly known as:  novoLOG  Inject 0-7 Units into the skin 4 (four) times daily -  before meals and at bedtime.     insulin detemir 100 UNIT/ML injection  Commonly known as:  LEVEMIR  Inject 0.14 mLs (14 Units total) into the skin at bedtime.     insulin NPH-regular Human (70-30) 100 UNIT/ML injection  Commonly known as:  NOVOLIN 70/30  Frequency:BID   Dosage:0.0     Instructions:  Note:Dose: 30U Q AM, 25U Q PM     isosorbide dinitrate 30 MG tablet  Commonly known as:  ISORDIL  Take 30 mg by mouth daily.     levothyroxine 75 MCG tablet  Commonly known as:  SYNTHROID, LEVOTHROID  Take 1 tablet (75 mcg total) by mouth daily before breakfast.     lisinopril 10 MG tablet  Commonly known as:  PRINIVIL,ZESTRIL  Take 10 mg by mouth daily.     nitroGLYCERIN 0.4 MG SL tablet  Commonly known as:  NITROSTAT  Place 0.4 mg under the tongue every 5 (five) minutes as needed for chest pain.     ondansetron 4 MG disintegrating tablet  Commonly known as:  ZOFRAN ODT  Take 1 tablet (4 mg total) by mouth every 8 (eight) hours as needed for nausea or vomiting.     oxyCODONE 5 MG immediate release tablet  Commonly known as:  Oxy IR/ROXICODONE  Take 1 tablet (5 mg total) by mouth every 4 (four) hours as needed for moderate pain.     pantoprazole  40 MG tablet  Commonly known as:  PROTONIX  Take 40 mg by mouth 2 (two) times daily.     PARoxetine 20 MG tablet  Commonly known as:  PAXIL  Take 20 mg by mouth daily.     pregabalin 75 MG capsule  Commonly known as:  LYRICA  Take 150 mg by mouth 2 (two) times daily.     PRILOSEC 40 MG capsule  Generic drug:  omeprazole  TAKE 1 BY MOUTH DAILY     promethazine 25 MG tablet  Commonly known as:  PHENERGAN  Take 1 tablet (25 mg total) by mouth every 6 (six) hours as needed for nausea or vomiting.     rosuvastatin 20 MG tablet  Commonly known as:  CRESTOR  Take 1 tablet (20 mg total) by mouth at bedtime.     sucralfate 1 G tablet  Commonly known as:  CARAFATE  Take 1 g by mouth 4 (four) times daily -  with meals and at bedtime.     traZODone 50 MG tablet  Commonly known as:  DESYREL  Take 75 mg by mouth at bedtime.        Allergies:  Allergies  Allergen Reactions  . Amoxicillin-Pot Clavulanate Nausea And Vomiting and Itching  .  Rifampin Nausea And Vomiting  . Tape Other (See Comments)    Family History: Family History  Problem Relation Age of Onset  . Heart disease Other   . Hypertension Other   . Hypertension Mother   . Heart disease Mother   . Diabetes Mother   . Birth defects Paternal Uncle     unaware  . Birth defects Paternal Grandmother     breast  . Kidney disease Neg Hx   . Prostate cancer Neg Hx     Social History:  reports that he has never smoked. He quit smokeless tobacco use about 19 years ago. His smokeless tobacco use included Chew. He reports that he does not drink alcohol or use illicit drugs.  ROS: UROLOGY Frequent Urination?: No Hard to postpone urination?: No Burning/pain with urination?: Yes Get up at night to urinate?: No Leakage of urine?: No Urine stream starts and stops?: No Trouble starting stream?: No Do you have to strain to urinate?: No Blood in urine?: No Urinary tract infection?: No Sexually transmitted disease?:  No Injury to kidneys or bladder?: No Painful intercourse?: No Weak stream?: No Erection problems?: No Penile pain?: No  Gastrointestinal Nausea?: No Vomiting?: No Indigestion/heartburn?: No Diarrhea?: No Constipation?: No  Constitutional Fever: No Night sweats?: No Weight loss?: No Fatigue?: No  Skin Skin rash/lesions?: No Itching?: No  Eyes Blurred vision?: No Double vision?: No  Ears/Nose/Throat Sore throat?: No Sinus problems?: No  Hematologic/Lymphatic Swollen glands?: No Easy bruising?: No  Cardiovascular Leg swelling?: No Chest pain?: No  Respiratory Cough?: Yes Shortness of breath?: No  Endocrine Excessive thirst?: No  Musculoskeletal Back pain?: Yes Joint pain?: No  Neurological Headaches?: Yes Dizziness?: Yes  Psychologic Depression?: No Anxiety?: Yes  Physical Exam: Blood pressure 170/84, pulse 79, height 6\' 3"  (1.905 m), weight 185 lb (83.915 kg). Abdomen:  Patient's abdomen is with ascites.  Suprapubic site is clean and dry.  A scant amount of granulation tissue is noted.  Laboratory Data: Lab Results  Component Value Date   WBC 8.8 06/13/2015   HGB 7.8* 06/13/2015   HCT 23.5* 06/13/2015   MCV 83.7 06/13/2015   PLT 277 06/13/2015    Lab Results  Component Value Date   CREATININE 3.06* 06/13/2015    Lab Results  Component Value Date   HGBA1C 9.4* 03/04/2015    Procedure:     Suprapubic Cath Change  Patient is present today for a suprapubic catheter change due to urinary retention.   6 ml of water was drained from the balloon, a 16 FR foley cath was located very superficially and was removed from the tract with out difficulty.  Site was cleaned and prepped in a sterile fashion with betadine.  A 16 FR foley cath was attempted to be replaced into the tract no complications were noted.   Preformed by: 03/06/2015 PA-C  Follow up: One Month   Assessment & Plan:    1. 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      2. SPT exchange: SPT was exchanged.  He will return in 1 month time for his next SPT exchange.   Return in about 1 month (around 09/08/2015) for SPT exchange.  09/10/2015, PA-C  [facility] [address], [address] [place], [place] [phone]

## 2015-08-11 ENCOUNTER — Encounter: Payer: Self-pay | Admitting: Urology

## 2015-08-28 ENCOUNTER — Other Ambulatory Visit: Payer: Self-pay | Admitting: *Deleted

## 2015-08-28 ENCOUNTER — Telehealth: Payer: Self-pay | Admitting: *Deleted

## 2015-08-28 MED ORDER — CARVEDILOL 25 MG PO TABS: 25.0000 mg | ORAL_TABLET | Freq: Two times a day (BID) | ORAL | Status: DC

## 2015-08-28 NOTE — Telephone Encounter (Signed)
Error

## 2015-09-11 ENCOUNTER — Ambulatory Visit (INDEPENDENT_AMBULATORY_CARE_PROVIDER_SITE_OTHER): Payer: Medicare Other | Admitting: Family Medicine

## 2015-09-11 ENCOUNTER — Encounter: Payer: Self-pay | Admitting: Family Medicine

## 2015-09-11 VITALS — BP 144/62 | HR 76 | Temp 98.2°F

## 2015-09-11 DIAGNOSIS — I6522 Occlusion and stenosis of left carotid artery: Secondary | ICD-10-CM | POA: Diagnosis not present

## 2015-09-11 DIAGNOSIS — E1100 Type 2 diabetes mellitus with hyperosmolarity without nonketotic hyperglycemic-hyperosmolar coma (NKHHC): Secondary | ICD-10-CM

## 2015-09-11 DIAGNOSIS — M5090 Cervical disc disorder, unspecified, unspecified cervical region: Secondary | ICD-10-CM | POA: Diagnosis not present

## 2015-09-11 MED ORDER — CLOPIDOGREL BISULFATE 75 MG PO TABS: 75.0000 mg | ORAL_TABLET | Freq: Every day | ORAL | Status: AC

## 2015-09-11 NOTE — Assessment & Plan Note (Signed)
Neurosurgery referral placed.  

## 2015-09-11 NOTE — Progress Notes (Signed)
Pre visit review using our clinic review tool, if applicable. No additional management support is needed unless otherwise documented below in the visit note. 

## 2015-09-11 NOTE — Assessment & Plan Note (Signed)
Complicated medical history Understandable why currently not a good surgical candidate. Continue Plavix 75 mg daily- eRx sent today. Keep appt with vascular.

## 2015-09-11 NOTE — Progress Notes (Signed)
Subjective:   Patient ID: Taylor [name], male    DOB: Jul 30, 1971, 44 y.o.   MRN: [id number]  Taylor [name] is a pleasant 44 y.o. year old male who presents to clinic today with Follow-up  on 09/11/2015  HPI:  Left carotid stenosis-  Angiogram performed due to dizziness which he thought was coming from cervical disc injury sustained in 05/2015.  Thought doctors at [facility] were going to refer him to neurosurgeon but per pt, was told that PCP needed to do that.  Started him on plavix and advised follow up with vascular in 6 months- poor surgical candidate.  Blood sugars every elevated when he arrived for angiogram- sent to ED and given IV insulin and fluids and sent home.  Dizziness has improved- taking Plavix as directed.  Diagnostic Cerebral Angiogram  1. Right femoral arteriogram. 2. Bilateral subclavian arteriograms. 3. Bilateral common carotid arteriograms. 4. Bilateral internal carotid arteriograms. 5. Bilateral vertebral arteriograms.  6. Left external carotid arteriogram.    History: Taylor [name] is a 44 y.o. male patient with history of DM2, HTN, CAD, ESRD on dialysis presenting with episodic dizziness and nausea, and CTA showing left vertebral artery occlusion at C2 level with reconstitution of the V4 segment, as well as atherosclerotic stenosis of the right vertebral artery V4 segment. Since his 08/15/15 neurosurgery clinic visit, patient reports several additional episodes of dizziness similar to previous episodes. Ambulates with walker. Denies visual changes. He is taking ASA 81 mg daily. Non-smoker. Patient is now here to undergo cerebral arteriogram under moderate sedation. Of note, patient was found to be hyperglycemic pre-procedure with blood glucose out of measurable range. Internal medicine contacted, patient to report to ED following procedure for evaluation.   Interventionalist: Marily Lente, MD  Assistants: Liberty Handy. Manson Passey, MD;   Ruben Im. Gerarda Gunther, MD  Sedation: Conscious sedation was employed using 1 mg Versed and 50 mcg fentanyl.  Consent: The risks and benefits were explained to the patient. All questions were answered. Informed consent was obtained.   Contrast: 120 ml Isovue-300.   DAP: 40981 Fluoro Time: 17:24  Procedure: The patient's right groin was cleaned, prepped, and draped in standard manner. The right common femoral artery was percutaneously accessed with a micropuncture device and a 5 French introducer sheath placed.   A 5 French Angled glide catheter was advanced into the aortic arch over a glidewire and under fluoroscopic guidance. The catheter was advanced into the right subclavian artery. An angiogram was performed in AP and lateral projection. The catheter was then advanced into the right common carotid artery.  An injection was then completed with RAO and lateral views of the carotid bifurcation. The catheter was then advanced into the right internal carotid artery using roadmap assistance.  AP and lateral images of the intracranial circulation obtained.  The right ICA injection was repeated with magnified transorbital and lateral views.  The catheter was withdrawn into the brachiocephalic artery, and then advanced into the right vertebral artery over a wire using fluoroscopic and roadmap guidance. AP and lateral angiography was performed of the cervical portion of the vertebral artery. Transfacial and lateral images of the posterior circulation were obtained.   The catheter was withdrawn into the aortic arch and subsequently advanced into the left common carotid artery.  An injection was then completed with LAO and lateral views of the carotid bifurcation. The catheter was then advanced into the left internal carotid artery using roadmap assistance. AP and lateral images of the intracranial  circulation obtained.   The left ICA injection was repeated with magnified transorbital and  lateral views.   The catheter was withdrawn into the aortic arch and the left subclavian artery catheterized. AP and lateral angiography was performed. The catheter was then advanced into the left vertebral artery over a wire using fluoroscopic and roadmap guidance. Transfacial and lateral views of the posterior circulation were obtained.  The catheter was withdrawn into the aortic arch, and then advanced into the left common carotid artery, and then into the external carotid artery under fluoroscopic and roadmap guidance. AP and lateral images of the ECA branches were obtained.   The catheter was removed. The final images were evaluated, and the introducer sheath removed. Hemostasis was obtained with a 5 Fr Mynx device.  Findings:  1. Right femoral arteriogram demonstrates common femoral puncture site below the level of the inferior epigastric artery with no flow-limiting stenosis or dissection.     2. Right subclavian arteriogram demonstrates mild stenosis of the right vertebral artery origin. Subclavian artery is patent without stenosis.   3. Right common carotid artery injection demonstrates normal carotid bifurcation with no evidence of stenosis or FMD.   4.  Right internal carotid arteriogram demonstrates antegrade filling of the anterior and middle cerebral arteries. There is moderate stenosis of the right petrous ICA. Capillary and venous phases are unremarkable.  There is no evidence of aneurysms or AVMs.  5. Right vertebral artery injection demonstrates mild focal stenosis of the right V4 segment, retrograde filling of the left V4 segment and left PICA, and normal antegrade opacification of the posterior circulation. The capillary and venous phases are unremarkable. There is no evidence of aneurysms or AVMs.  6. Left common carotid artery injection demonstrates normal carotid bifurcation with no evidence of stenosis or FMD.   7.  Left internal carotid arteriogram  demonstrates antegrade filling of the anterior and middle cerebral arteries. There is severe stenosis of the left petrous ICA. The capillary and venous phases are unremarkable. There is no evidence of aneurysms or AVMs.  8. Left subclavian arteriogram demonstrates normal patent subclavian artery and left vertebral artery origin.   9. Left vertebral artery injection demonstrates complete occlusion of the left V3/V4 segment.    10. Left external carotid arteriogram demonstrates antegrade filling of ECA branches with no evidence of ECA to vertebral artery collaterals.    Complications: None.   Disposition:  The patient was taken to the recovery room in stable condition.  Patient will be transported to ED for workup of hyperglycemia. Internal medicine is aware of patient should he require admission.    Impression:  1. Occlusion of the left vertebral artery V3/V4 segment.  2. Mild stenosis of the right vertebral artery V4 segment. Retrograde filling of the contralateral left vertebral artery V4 segment and PICA.  3. Mild stenosis of the right vertebral artery origin. 4. Severe stenosis of the left ICA petrous segment. 5. Moderate stenosis of the right ICA petrous segment.    Dr. Arlyss Queen was present or immediately available for the entire procedure.  Electronically Reviewed by:  Mare Ferrari, MD Electronically Reviewed on:  08/28/2015 1:43 PM  I have reviewed the images and concur with the above findings.  Electronically Signed by:  Marliss Czar, MD Electronically Signed on:  09/04/2015 9:28 AM  Current Outpatient Prescriptions on File Prior to Visit  Medication Sig Dispense Refill  . acetaminophen (TYLENOL) 500 MG tablet Take 1,000 mg by mouth every 6 (six) hours as needed for mild  pain or headache.     . ALPRAZolam (XANAX) 0.25 MG tablet Take 1 tablet (0.25 mg total) by mouth every 6 (six) hours as needed for anxiety. 5 tablet 0  . alum & mag hydroxide-simeth (MAALOX  PLUS) 400-400-40 MG/5ML suspension Take 30 mLs by mouth every 4 (four) hours as needed for indigestion.     [name] amitriptyline (ELAVIL) 25 MG tablet Take 1 tablet (25 mg total) by mouth at bedtime. 30 tablet 1  . aspirin EC 81 MG tablet Take 81 mg by mouth.    . calcium carbonate (TUMS - DOSED IN MG ELEMENTAL CALCIUM) 500 MG chewable tablet Chew 1-2 tablets (200-400 mg of elemental calcium total) by mouth 2 (two) times daily as needed for indigestion or heartburn. 1 tablet 0  . calcium-vitamin D (OSCAL WITH D) 500-200 MG-UNIT per tablet Take 1 tablet by mouth 2 (two) times daily.    . carvedilol (COREG) 25 MG tablet Take 1 tablet (25 mg total) by mouth 2 (two) times daily with a meal. 180 tablet 1  . cinacalcet (SENSIPAR) 60 MG tablet Take 60 mg by mouth daily.     . clindamycin (CLEOCIN) 300 MG capsule Take 1 capsule (300 mg total) by mouth 3 (three) times daily. 30 capsule 0  . cloNIDine (CATAPRES) 0.1 MG tablet Take 0.1 mg by mouth 2 (two) times daily.    . DHA-EPA-VITAMIN E PO Take 1 capsule by mouth daily.    . furosemide (LASIX) 80 MG tablet Take 80 mg by mouth 2 (two) times daily.    [name] HYDROcodone-acetaminophen (NORCO/VICODIN) 5-325 MG per tablet Take 1 tablet by mouth every 6 (six) hours as needed for moderate pain. 30 tablet 0  . insulin aspart (NOVOLOG) 100 UNIT/ML injection Inject 0-7 Units into the skin 4 (four) times daily -  before meals and at bedtime. 10 mL 11  . insulin detemir (LEVEMIR) 100 UNIT/ML injection Inject 0.14 mLs (14 Units total) into the skin at bedtime. 10 mL 11  . insulin NPH-regular Human (NOVOLIN 70/30) (70-30) 100 UNIT/ML injection Frequency:BID   Dosage:0.0     Instructions:  Note:Dose: 30U Q AM, 25U Q PM    . isosorbide dinitrate (ISORDIL) 30 MG tablet Take 30 mg by mouth daily.    [name] levothyroxine (SYNTHROID, LEVOTHROID) 75 MCG tablet Take 1 tablet (75 mcg total) by mouth daily before breakfast. 90 tablet 1  . lisinopril (PRINIVIL,ZESTRIL) 10 MG tablet Take 10 mg by  mouth daily.    . nitroGLYCERIN (NITROSTAT) 0.4 MG SL tablet Place 0.4 mg under the tongue every 5 (five) minutes as needed for chest pain.    . Omega-3 Fatty Acids (FISH OIL) 1000 MG CAPS Take 1 capsule by mouth daily.    . ondansetron (ZOFRAN ODT) 4 MG disintegrating tablet Take 1 tablet (4 mg total) by mouth every 8 (eight) hours as needed for nausea or vomiting. 20 tablet 0  . oxyCODONE (OXY IR/ROXICODONE) 5 MG immediate release tablet Take 1 tablet (5 mg total) by mouth every 4 (four) hours as needed for moderate pain. 5 tablet 0  . pantoprazole (PROTONIX) 40 MG tablet Take 40 mg by mouth 2 (two) times daily.    [name] PARoxetine (PAXIL) 20 MG tablet Take 20 mg by mouth daily.     . pregabalin (LYRICA) 75 MG capsule Take 150 mg by mouth 2 (two) times daily.    [name] PRILOSEC 40 MG capsule TAKE 1 BY MOUTH DAILY 90 capsule 2  . promethazine (PHENERGAN) 25  MG tablet Take 1 tablet (25 mg total) by mouth every 6 (six) hours as needed for nausea or vomiting. 30 tablet 0  . rosuvastatin (CRESTOR) 20 MG tablet Take 1 tablet (20 mg total) by mouth at bedtime. 30 tablet 5  . sucralfate (CARAFATE) 1 G tablet Take 1 g by mouth 4 (four) times daily -  with meals and at bedtime.    . traZODone (DESYREL) 50 MG tablet Take 75 mg by mouth at bedtime.     No current facility-administered medications on file prior to visit.    Allergies  Allergen Reactions  . Amoxicillin-Pot Clavulanate Nausea And Vomiting and Itching  . Rifampin Nausea And Vomiting  . Tape Other (See Comments)    Past Medical History  Diagnosis Date  . End stage renal disease on dialysis (HCC)     LUE fistula  . Type I diabetes mellitus (HCC)     a. 03/2014 admitted with HNK to [facility]. b. TTS  . Diabetic neuropathy (HCC)     severe, s/p multiple toe amputation  . Hypothyroidism   . Hypertension   . Hyperlipidemia   . H/O hiatal hernia   . GERD (gastroesophageal reflux disease)   . Anxiety   . Sebaceous cyst     side of neck  .  Pneumonia     2010  . Anemia     a. req PRBC's 2011.  [name] PAD (peripheral artery disease) (HCC)     a. s/p amputation of toes on the right;  b. left LE claudication.  . Coronary artery disease     a. s/p MI;  b. 10/2009 CABG x 3 @ [facility]: LIMA->LAD, VG->OM3, VG->RPDA; c. 11/2010 Cath 3/3 patent grafts;  d 12/2012 Cath: LM 30d, LAD 85p, D1 70, D2 90, LCX 40ost, OM2 100, RCA 90p, 169m, L->LAD ok, VG->OM3 ok, VG->RPDA 30, EF 50%->Med Rx.  . Cataract     right  . Valvular disease     a. 11/2012 Echo: EF 55-60%, mild LVH, mild MR, mild bi-atrial enlargement, mild-mod TR, PASP ; b. echo 03/2014: EF 50-55%, nl WM, select images concerning for bicuspid aortic valve w/ nl thicknes of leaflets, mild MR, mild bi-atrial dilatation, RV mild dilatation - wall thickness nl, mod TR - select images appears to be mod to sev, PASP at least mod elevated.  . Diastolic CHF (HCC)     see echo above  . Depression   . Arthritis     rheumatoid arthritis   . Myocardial infarction (HCC) 2010  . Pulmonary HTN (HCC)     a. continuation of 03/2014 echo PASP @ least mod elevated. RVSP 52 mm Hg. Parasternal long axis estimated @ 85 mm Hg  . Scrotal abscess     a. s/p multiple I&D  . Penile abscess     a. s/p multiple I&D  . Hematemesis     a. EGD 2016 with LA Grade C esophagitis, continued on Protonix   . Chronic respiratory failure (HCC)     a. on 3L oxygen via nasal cannula  . Anxiety   . Acute delirium   . Sepsis (HCC)   . 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 MYOCARDIAL INFARCTION, HX OF 01/26/2011    Qualifier: Diagnosis of  By: [name] MD, [name]    . HTN (hypertension) 02/11/2011  . Pulmonary hypertension associated with end stage renal disease on dialysis [facility]) 04/16/2014    Past Surgical History  Procedure Laterality Date  . Dialysis fistula creation  left upper arm fistula  . Amputation      TOES ON BOTH FEET  . Abscess drainage  Behind right ear/occipital scalp  . Skin graft    . Eye surgery  1999  .  Coronary artery bypass graft  10/2009    [facility] (Dr. Katrinka Blazing)  . Foot amputation    . Cardiac catheterization    . Cardiac catheterization  2/14    [facility]: severe 3 vessel CAD with patent grafts, RHC: moderately elevated PCW and pulmonary hypertension  . Penile prosthesis implant N/A 07/24/2014    Procedure: SALINE PENILE INJECTION WITH DISSECTION OF CORPORA ,  IMPLANTATION OF COLOPLAST PENILE PROTHESIS INFLATABLE;  Surgeon: [name], MD;  Location: [facility] ORS;  Service: Urology;  Laterality: N/A;  . Removal of penile prosthesis N/A 08/22/2014    Procedure: EXPLANT OF INFECTED  PENILE PROSTHESIS;  Surgeon: [name], MD;  Location: [facility] ORS;  Service: Urology;  Laterality: N/A;  . Irrigation and debridement abscess Bilateral 12/14/2014    Procedure: Bilateral Corporal Irrigation with Cultures and Drainage, Penrose Drain Insertion;  Surgeon: [name], MD;  Location: [facility] OR;  Service: Urology;  Laterality: Bilateral;  . Scrotal exploration N/A 02/03/2015    Procedure: IRRIGATION AND DEBRIDEMENT SCROTAL/PENILE ABSCESS;  Surgeon: [name], MD;  Location: [facility] OR;  Service: Urology;  Laterality: N/A;  . Cystoscopy N/A 02/03/2015    Procedure: CYSTOSCOPY;  Surgeon: [name], MD;  Location: [facility] OR;  Service: Urology;  Laterality: N/A;  . Esophagogastroduodenoscopy N/A 03/18/2015    Procedure: ESOPHAGOGASTRODUODENOSCOPY (EGD);  Surgeon: [name], MD;  Location: [facility] ENDOSCOPY;  Service: Endoscopy;  Laterality: N/A;  . Cardiac catheterization N/A 05/27/2015    Procedure: Right Heart Cath;  Surgeon: [name], MD;  Location: [facility] INVASIVE CV LAB;  Service: Cardiovascular;  Laterality: N/A;    Family History  Problem Relation Age of Onset  . Heart disease Other   . Hypertension Other   . Hypertension Mother   . Heart disease Mother   . Diabetes Mother   . Birth defects Paternal Uncle     unaware  . Birth defects Paternal Grandmother     breast  . Kidney  disease Neg Hx   . Prostate cancer Neg Hx     Social History   Social History  . Marital Status: Single    Spouse Name: N/A  . Number of Children: N/A  . Years of Education: N/A   Occupational History  . Not on file.   Social History Main Topics  . Smoking status: Never Smoker   . Smokeless tobacco: Former Neurosurgeon    Types: Chew    Quit date: 08/23/1995  . Alcohol Use: No     Comment: occasional beer  . Drug Use: No  . Sexual Activity: Not Currently   Other Topics Concern  . Not on file   Social History Narrative   He is a DNR- yellow sheet given to pt today.   The PMH, PSH, Social History, Family History, Medications, and allergies have been reviewed in [facility], and have been updated if relevant.  Review of Systems  Constitutional: Negative.   HENT: Negative.   Respiratory: Negative.   Cardiovascular: Negative.   Gastrointestinal: Negative.   Genitourinary: Negative.   Musculoskeletal: Negative.   Skin: Negative.   Neurological: Positive for light-headedness and numbness. Negative for dizziness, syncope, facial asymmetry and headaches.  Hematological: Negative.   Psychiatric/Behavioral: Negative.   All other systems reviewed and are  negative.      Objective:    BP 144/62 mmHg  Pulse 76  Temp(Src) 98.2 F (36.8 C) (Oral)  Wt   SpO2 98%   Physical Exam   Constitutional: He is oriented to person, place, and time. He appears well-developed and well-nourished. No distress.  In wheelchair HENT: No nasal discharge.  Head: Normocephalic and atraumatic.  Eyes: Pupils are equal and round. Right eye exhibits no discharge. Left eye exhibits no discharge.  Neck: Normal range of motion. Neck supple. JVD present. No thyromegaly present.  Cardiovascular: Normal rate, regular rhythm, normal heart sounds absent distal pulses. Exam reveals no gallop and no friction rub. There is a 2/6 holosystolic murmur at the left sternal border  +bruit Right  carotid Pulmonary/Chest: Effort normal and breath sounds normal. No stridor. No respiratory distress. He has no wheezes. He has no rales. He exhibits no tenderness.  Abdominal: Soft. Bowel sounds are normal. He exhibits no distension. There is no tenderness. There is no rebound and no guarding.  Musculoskeletal: Normal range of motion. He exhibits trace edema and no tenderness.  Neurological: He is alert and oriented to person, place, and time. Coordination normal.  Skin: Skin is warm and dry. No rash noted. He is not diaphoretic. No erythema. No pallor.  Psychiatric: He has a normal mood and affect. His behavior is normal. Judgment and thought content normal.       Assessment & Plan:   Left carotid stenosis  Cervical disc disorder - Plan: Ambulatory referral to Neurosurgery No Follow-up on file.

## 2015-10-08 ENCOUNTER — Encounter: Payer: Self-pay | Admitting: Urology

## 2015-10-08 ENCOUNTER — Ambulatory Visit (INDEPENDENT_AMBULATORY_CARE_PROVIDER_SITE_OTHER): Payer: Medicare Other | Admitting: Urology

## 2015-10-08 VITALS — BP 170/88 | HR 80 | Resp 16

## 2015-10-08 DIAGNOSIS — I6522 Occlusion and stenosis of left carotid artery: Secondary | ICD-10-CM | POA: Diagnosis not present

## 2015-10-08 DIAGNOSIS — R339 Retention of urine, unspecified: Secondary | ICD-10-CM

## 2015-10-08 DIAGNOSIS — Z435 Encounter for attention to cystostomy: Secondary | ICD-10-CM

## 2015-10-08 NOTE — Progress Notes (Signed)
When assisting Taylor [name] with patient SPT change, patient was on the bed in the lowest position and when attempting to help patients' Uncle help patient to standing position patient just slid off the end of exam table onto knees due to house shoes patient was wearing not having any grip. Another assistant Natividad Brood was called to help Uncle lift patient back onto bed and then to a standing position to pull his pants up and then into his wheelchair. Patient was not injured and when asked denied pain.

## 2015-10-08 NOTE — Progress Notes (Signed)
4:37 PM   AURELIANO OSHIELDS [birth year] [id number]  Referring provider: Dianne Dun, MD [address] [place], [place] [address]  Chief Complaint  Patient presents with  . Suprapubic tube change  . Urinary Retention    HPI: Mr. Rodda is a 44 year old who presents today for SPT exchange.  He states his suprapubic tube stopped draining two days ago.  He called the office, but the voice mail stated we were closed.  He is currently receiving dialysis three days a week.    He has not had any fevers, gross hematuria or mental status changes.  He has an upcoming appointment with Dr. Achilles Dunk at [facility] next week for further evaluation for his urethrocutaneous fistula.    PMH: Past Medical History  Diagnosis Date  . End stage renal disease on dialysis (HCC)     LUE fistula  . Type I diabetes mellitus (HCC)     a. 03/2014 admitted with HNK to [facility]. b. TTS  . Diabetic neuropathy (HCC)     severe, s/p multiple toe amputation  . Hypothyroidism   . Hypertension   . Hyperlipidemia   . H/O hiatal hernia   . GERD (gastroesophageal reflux disease)   . Anxiety   . Sebaceous cyst     side of neck  . Pneumonia     2010  . Anemia     a. req PRBC's 2011.  [name] PAD (peripheral artery disease) (HCC)     a. s/p amputation of toes on the right;  b. left LE claudication.  . Coronary artery disease     a. s/p MI;  b. 10/2009 CABG x 3 @ [facility]: LIMA->LAD, VG->OM3, VG->RPDA; c. 11/2010 Cath 3/3 patent grafts;  d 12/2012 Cath: LM 30d, LAD 85p, D1 70, D2 90, LCX 40ost, OM2 100, RCA 90p, 160m, L->LAD ok, VG->OM3 ok, VG->RPDA 30, EF 50%->Med Rx.  . Cataract     right  . Valvular disease     a. 11/2012 Echo: EF 55-60%, mild LVH, mild MR, mild bi-atrial enlargement, mild-mod TR, PASP ; b. echo 03/2014: EF 50-55%, nl WM, select images concerning for bicuspid aortic valve w/ nl thicknes of leaflets, mild MR, mild bi-atrial dilatation, RV mild dilatation - wall thickness nl, mod TR - select images  appears to be mod to sev, PASP at least mod elevated.  . Diastolic CHF (HCC)     see echo above  . Depression   . Arthritis     rheumatoid arthritis   . Myocardial infarction (HCC) 2010  . Pulmonary HTN (HCC)     a. continuation of 03/2014 echo PASP @ least mod elevated. RVSP 52 mm Hg. Parasternal long axis estimated @ 85 mm Hg  . Scrotal abscess     a. s/p multiple I&D  . Penile abscess     a. s/p multiple I&D  . Hematemesis     a. EGD 2016 with LA Grade C esophagitis, continued on Protonix   . Chronic respiratory failure (HCC)     a. on 3L oxygen via nasal cannula  . Anxiety   . Acute delirium   . Sepsis (HCC)   . 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 MYOCARDIAL INFARCTION, HX OF 01/26/2011    Qualifier: Diagnosis of  By: [name] MD, [name]    . HTN (hypertension) 02/11/2011  . Pulmonary hypertension associated with end stage renal disease on dialysis [facility]) 04/16/2014    Surgical History: Past Surgical History  Procedure  Laterality Date  . Dialysis fistula creation      left upper arm fistula  . Amputation      TOES ON BOTH FEET  . Abscess drainage  Behind right ear/occipital scalp  . Skin graft    . Eye surgery  1999  . Coronary artery bypass graft  10/2009    [facility] (Dr. Katrinka Blazing)  . Foot amputation    . Cardiac catheterization    . Cardiac catheterization  2/14    [facility]: severe 3 vessel CAD with patent grafts, RHC: moderately elevated PCW and pulmonary hypertension  . Penile prosthesis implant N/A 07/24/2014    Procedure: SALINE PENILE INJECTION WITH DISSECTION OF CORPORA ,  IMPLANTATION OF COLOPLAST PENILE PROTHESIS INFLATABLE;  Surgeon: [name], MD;  Location: [facility] ORS;  Service: Urology;  Laterality: N/A;  . Removal of penile prosthesis N/A 08/22/2014    Procedure: EXPLANT OF INFECTED  PENILE PROSTHESIS;  Surgeon: [name], MD;  Location: [facility] ORS;  Service: Urology;  Laterality: N/A;  . Irrigation and debridement abscess Bilateral 12/14/2014    Procedure:  Bilateral Corporal Irrigation with Cultures and Drainage, Penrose Drain Insertion;  Surgeon: [name], MD;  Location: [facility] OR;  Service: Urology;  Laterality: Bilateral;  . Scrotal exploration N/A 02/03/2015    Procedure: IRRIGATION AND DEBRIDEMENT SCROTAL/PENILE ABSCESS;  Surgeon: [name], MD;  Location: [facility] OR;  Service: Urology;  Laterality: N/A;  . Cystoscopy N/A 02/03/2015    Procedure: CYSTOSCOPY;  Surgeon: [name], MD;  Location: [facility] OR;  Service: Urology;  Laterality: N/A;  . Esophagogastroduodenoscopy N/A 03/18/2015    Procedure: ESOPHAGOGASTRODUODENOSCOPY (EGD);  Surgeon: [name], MD;  Location: [facility] ENDOSCOPY;  Service: Endoscopy;  Laterality: N/A;  . Cardiac catheterization N/A 05/27/2015    Procedure: Right Heart Cath;  Surgeon: [name], MD;  Location: [facility] INVASIVE CV LAB;  Service: Cardiovascular;  Laterality: N/A;    Home Medications:    Medication List       This list is accurate as of: 10/08/15  4:37 PM.  Always use your most recent med list.               acetaminophen 500 MG tablet  Commonly known as:  TYLENOL  Take 1,000 mg by mouth every 6 (six) hours as needed for mild pain or headache.     ALPRAZolam 0.25 MG tablet  Commonly known as:  XANAX  Take 1 tablet (0.25 mg total) by mouth every 6 (six) hours as needed for anxiety.     alum & mag hydroxide-simeth 400-400-40 MG/5ML suspension  Commonly known as:  MAALOX PLUS  Take 30 mLs by mouth every 4 (four) hours as needed for indigestion.     amitriptyline 25 MG tablet  Commonly known as:  ELAVIL  Take 1 tablet (25 mg total) by mouth at bedtime.     aspirin EC 81 MG tablet  Take 81 mg by mouth.     calcium carbonate 500 MG chewable tablet  Commonly known as:  TUMS - dosed in mg elemental calcium  Chew 1-2 tablets (200-400 mg of elemental calcium total) by mouth 2 (two) times daily as needed for indigestion or heartburn.     calcium-vitamin D 500-200 MG-UNIT tablet    Commonly known as:  OSCAL WITH D  Take 1 tablet by mouth 2 (two) times daily.     carvedilol 25 MG tablet  Commonly known as:  COREG  Take 1 tablet (25 mg total) by  mouth 2 (two) times daily with a meal.     cinacalcet 60 MG tablet  Commonly known as:  SENSIPAR  Take 60 mg by mouth daily.     clindamycin 300 MG capsule  Commonly known as:  CLEOCIN  Take 1 capsule (300 mg total) by mouth 3 (three) times daily.     cloNIDine 0.1 MG tablet  Commonly known as:  CATAPRES  Take 0.1 mg by mouth 2 (two) times daily.     clopidogrel 75 MG tablet  Commonly known as:  PLAVIX  Take 1 tablet (75 mg total) by mouth daily.     DHA-EPA-VITAMIN E PO  Take 1 capsule by mouth daily.     Fish Oil 1000 MG Caps  Take 1 capsule by mouth daily.     furosemide 80 MG tablet  Commonly known as:  LASIX  Take 80 mg by mouth 2 (two) times daily.     HYDROcodone-acetaminophen 5-325 MG tablet  Commonly known as:  NORCO/VICODIN  Take 1 tablet by mouth every 6 (six) hours as needed for moderate pain.     insulin aspart 100 UNIT/ML injection  Commonly known as:  novoLOG  Inject 0-7 Units into the skin 4 (four) times daily -  before meals and at bedtime.     insulin detemir 100 UNIT/ML injection  Commonly known as:  LEVEMIR  Inject 0.14 mLs (14 Units total) into the skin at bedtime.     insulin NPH-regular Human (70-30) 100 UNIT/ML injection  Commonly known as:  NOVOLIN 70/30  Frequency:BID   Dosage:0.0     Instructions:  Note:Dose: 30U Q AM, 25U Q PM     isosorbide dinitrate 30 MG tablet  Commonly known as:  ISORDIL  Take 30 mg by mouth daily.     levothyroxine 75 MCG tablet  Commonly known as:  SYNTHROID, LEVOTHROID  Take 1 tablet (75 mcg total) by mouth daily before breakfast.     lisinopril 10 MG tablet  Commonly known as:  PRINIVIL,ZESTRIL  Take 10 mg by mouth daily.     nitroGLYCERIN 0.4 MG SL tablet  Commonly known as:  NITROSTAT  Place 0.4 mg under the tongue every 5 (five)  minutes as needed for chest pain.     ondansetron 4 MG disintegrating tablet  Commonly known as:  ZOFRAN ODT  Take 1 tablet (4 mg total) by mouth every 8 (eight) hours as needed for nausea or vomiting.     oxyCODONE 5 MG immediate release tablet  Commonly known as:  Oxy IR/ROXICODONE  Take 1 tablet (5 mg total) by mouth every 4 (four) hours as needed for moderate pain.     pantoprazole 40 MG tablet  Commonly known as:  PROTONIX  Take 40 mg by mouth 2 (two) times daily.     PARoxetine 20 MG tablet  Commonly known as:  PAXIL  Take 20 mg by mouth daily.     pregabalin 75 MG capsule  Commonly known as:  LYRICA  Take 150 mg by mouth 2 (two) times daily.     PRILOSEC 40 MG capsule  Generic drug:  omeprazole  TAKE 1 BY MOUTH DAILY     promethazine 25 MG tablet  Commonly known as:  PHENERGAN  Take 1 tablet (25 mg total) by mouth every 6 (six) hours as needed for nausea or vomiting.     rosuvastatin 20 MG tablet  Commonly known as:  CRESTOR  Take 1 tablet (20 mg total) by mouth at  bedtime.     SSD 1 % cream  Generic drug:  silver sulfADIAZINE  APP AA BID     sucralfate 1 G tablet  Commonly known as:  CARAFATE  Take 1 g by mouth 4 (four) times daily -  with meals and at bedtime.     traZODone 50 MG tablet  Commonly known as:  DESYREL  Take 75 mg by mouth at bedtime.        Allergies:  Allergies  Allergen Reactions  . Amoxicillin-Pot Clavulanate Nausea And Vomiting and Itching  . Rifampin Nausea And Vomiting  . Tape Other (See Comments)    Family History: Family History  Problem Relation Age of Onset  . Heart disease Other   . Hypertension Other   . Hypertension Mother   . Heart disease Mother   . Diabetes Mother   . Birth defects Paternal Uncle     unaware  . Birth defects Paternal Grandmother     breast  . Kidney disease Neg Hx   . Prostate cancer Neg Hx     Social History:  reports that he has never smoked. He quit smokeless tobacco use about 20 years  ago. His smokeless tobacco use included Chew. He reports that he does not drink alcohol or use illicit drugs.  ROS: UROLOGY Frequent Urination?: No Hard to postpone urination?: No Burning/pain with urination?: No Get up at night to urinate?: No Leakage of urine?: No Urine stream starts and stops?: No Trouble starting stream?: No Do you have to strain to urinate?: No Blood in urine?: No Urinary tract infection?: No Sexually transmitted disease?: No Injury to kidneys or bladder?: No Painful intercourse?: No Weak stream?: No Erection problems?: No Penile pain?: No  Gastrointestinal Nausea?: No Vomiting?: No Indigestion/heartburn?: No Diarrhea?: No Constipation?: No  Constitutional Fever: No Night sweats?: No Weight loss?: No Fatigue?: No  Skin Skin rash/lesions?: No Itching?: No  Eyes Blurred vision?: No Double vision?: No  Ears/Nose/Throat Sore throat?: No Sinus problems?: No  Hematologic/Lymphatic Swollen glands?: No Easy bruising?: No  Cardiovascular Leg swelling?: No Chest pain?: No  Respiratory Cough?: No Shortness of breath?: No  Endocrine Excessive thirst?: No  Musculoskeletal Back pain?: No Joint pain?: No  Neurological Headaches?: No Dizziness?: No  Psychologic Depression?: No Anxiety?: No  Physical Exam: Blood pressure 170/88, pulse 80, resp. rate 16. Constitutional: Well nourished. Alert and oriented, No acute distress. HEENT:  AT, moist mucus membranes. Trachea midline, no masses. Cardiovascular: No clubbing, cyanosis, or edema. Respiratory: Normal respiratory effort, no increased work of breathing. GI: Abdomen is soft, non tender,  distended, no abdominal masses. Liver and spleen not palpable.  No hernias appreciated.  Stool sample for occult testing is not indicated.   GU: No CVA tenderness.  No bladder fullness or masses.  Penoscrotal exam clinically stable from previous occasions, mild penile edema without erythema  without drainage from left distal shaft opening with manipulation. Meatus is widely patent from previous debridement.  Skin: No rashes, bruises or suspicious lesions. Lymph: No cervical or inguinal adenopathy. Neurologic: Grossly intact, no focal deficits, moving all 4 extremities. Psychiatric: Normal mood and affect.     Laboratory Data: Lab Results  Component Value Date   WBC 8.8 06/13/2015   HGB 7.8* 06/13/2015   HCT 23.5* 06/13/2015   MCV 83.7 06/13/2015   PLT 277 06/13/2015    Lab Results  Component Value Date   CREATININE 3.06* 06/13/2015    Lab Results  Component Value Date   HGBA1C  9.4* 03/04/2015    Procedure:     Suprapubic Cath Change  Patient is present today for a suprapubic catheter change due to urinary retention.  He states it has not been draining for the last two days.  No urine was seen in the drainage bag.   10 ml of water was drained from the balloon, a 16 FR foley cath was removed from the tract with out difficulty.  Site was cleaned and prepped in a sterile fashion with betadine.  A 16 FR foley cath placed into the tract no complications were noted.  Cloudy urine then started to drain into the bag.  He had 200 cc of cloudy urine returned.  His cath irrigated easily with 20 cc of sterile water.    Preformed by: Lajuana Ripple PA-C  Follow up: One Month   Assessment & Plan:    1. 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 Achilles Dunk at [facility] next week.  Pending Dr. 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    2. SPT exchange: SPT was exchanged.  He will return in 1 month time for his next SPT exchange.   Return in about 1 month (around 11/07/2015) for SPT exchange.  Michiel Cowboy, PA-C  [facility] [address], [address] [place], [place] [address] [phone]

## 2015-10-10 ENCOUNTER — Emergency Department: Payer: Medicare Other

## 2015-10-10 ENCOUNTER — Encounter: Payer: Self-pay | Admitting: Emergency Medicine

## 2015-10-10 ENCOUNTER — Inpatient Hospital Stay
Admission: EM | Admit: 2015-10-10 | Discharge: 2015-10-17 | DRG: 291 | Disposition: A | Discharge status: Home / Self Care | Payer: Medicare Other | Attending: Internal Medicine | Admitting: Internal Medicine

## 2015-10-10 DIAGNOSIS — I252 Old myocardial infarction: Secondary | ICD-10-CM

## 2015-10-10 DIAGNOSIS — E1100 Type 2 diabetes mellitus with hyperosmolarity without nonketotic hyperglycemic-hyperosmolar coma (NKHHC): Secondary | ICD-10-CM

## 2015-10-10 DIAGNOSIS — E1040 Type 1 diabetes mellitus with diabetic neuropathy, unspecified: Secondary | ICD-10-CM | POA: Diagnosis present

## 2015-10-10 DIAGNOSIS — I5033 Acute on chronic diastolic (congestive) heart failure: Secondary | ICD-10-CM | POA: Diagnosis present

## 2015-10-10 DIAGNOSIS — I2510 Atherosclerotic heart disease of native coronary artery without angina pectoris: Secondary | ICD-10-CM | POA: Diagnosis present

## 2015-10-10 DIAGNOSIS — N186 End stage renal disease: Secondary | ICD-10-CM

## 2015-10-10 DIAGNOSIS — R0602 Shortness of breath: Secondary | ICD-10-CM | POA: Diagnosis present

## 2015-10-10 DIAGNOSIS — D631 Anemia in chronic kidney disease: Secondary | ICD-10-CM | POA: Diagnosis present

## 2015-10-10 DIAGNOSIS — Z8249 Family history of ischemic heart disease and other diseases of the circulatory system: Secondary | ICD-10-CM

## 2015-10-10 DIAGNOSIS — Z7982 Long term (current) use of aspirin: Secondary | ICD-10-CM | POA: Diagnosis not present

## 2015-10-10 DIAGNOSIS — Z794 Long term (current) use of insulin: Secondary | ICD-10-CM

## 2015-10-10 DIAGNOSIS — E1022 Type 1 diabetes mellitus with diabetic chronic kidney disease: Secondary | ICD-10-CM | POA: Diagnosis present

## 2015-10-10 DIAGNOSIS — E785 Hyperlipidemia, unspecified: Secondary | ICD-10-CM | POA: Diagnosis present

## 2015-10-10 DIAGNOSIS — B9689 Other specified bacterial agents as the cause of diseases classified elsewhere: Secondary | ICD-10-CM | POA: Diagnosis present

## 2015-10-10 DIAGNOSIS — E039 Hypothyroidism, unspecified: Secondary | ICD-10-CM | POA: Diagnosis present

## 2015-10-10 DIAGNOSIS — E10621 Type 1 diabetes mellitus with foot ulcer: Secondary | ICD-10-CM

## 2015-10-10 DIAGNOSIS — I132 Hypertensive heart and chronic kidney disease with heart failure and with stage 5 chronic kidney disease, or end stage renal disease: Secondary | ICD-10-CM | POA: Diagnosis present

## 2015-10-10 DIAGNOSIS — E1142 Type 2 diabetes mellitus with diabetic polyneuropathy: Secondary | ICD-10-CM

## 2015-10-10 DIAGNOSIS — R0600 Dyspnea, unspecified: Secondary | ICD-10-CM | POA: Diagnosis not present

## 2015-10-10 DIAGNOSIS — I6522 Occlusion and stenosis of left carotid artery: Secondary | ICD-10-CM | POA: Diagnosis present

## 2015-10-10 DIAGNOSIS — L97509 Non-pressure chronic ulcer of other part of unspecified foot with unspecified severity: Secondary | ICD-10-CM

## 2015-10-10 DIAGNOSIS — A0472 Enterocolitis due to Clostridium difficile, not specified as recurrent: Secondary | ICD-10-CM

## 2015-10-10 DIAGNOSIS — Z9981 Dependence on supplemental oxygen: Secondary | ICD-10-CM | POA: Diagnosis not present

## 2015-10-10 DIAGNOSIS — L8990 Pressure ulcer of unspecified site, unspecified stage: Secondary | ICD-10-CM | POA: Insufficient documentation

## 2015-10-10 DIAGNOSIS — J810 Acute pulmonary edema: Secondary | ICD-10-CM

## 2015-10-10 HISTORY — DX: Hyperkalemia: E87.5

## 2015-10-10 HISTORY — DX: Osteomyelitis, unspecified: M86.9

## 2015-10-10 LAB — URINALYSIS COMPLETE WITH MICROSCOPIC (ARMC ONLY)
Bilirubin Urine: NEGATIVE
Glucose, UA: >500 mg/dL — AB
KETONES UR: NEGATIVE mg/dL
Nitrite: NEGATIVE
PROTEIN: 100 mg/dL — AB
SQUAMOUS EPITHELIAL / LPF: NONE SEEN
Specific Gravity, Urine: 1.006 (ref 1.005–1.030)
pH: 7 (ref 5.0–8.0)

## 2015-10-10 LAB — BASIC METABOLIC PANEL
Anion gap: 11 (ref 5–15)
BUN: 19 mg/dL (ref 6–20)
CO2: 31 mmol/L (ref 22–32)
Calcium: 8.4 mg/dL — ABNORMAL LOW (ref 8.9–10.3)
Chloride: 92 mmol/L — ABNORMAL LOW (ref 101–111)
Creatinine, Ser: 2.41 mg/dL — ABNORMAL HIGH (ref 0.61–1.24)
GFR calc Af Amer: 35 mL/min — ABNORMAL LOW (ref >60)
GFR, EST NON AFRICAN AMERICAN: 31 mL/min — AB (ref >60)
Glucose, Bld: 309 mg/dL — ABNORMAL HIGH (ref 65–99)
POTASSIUM: 3.6 mmol/L (ref 3.5–5.1)
SODIUM: 134 mmol/L — AB (ref 135–145)

## 2015-10-10 LAB — CBC WITH DIFFERENTIAL/PLATELET
BASOS ABS: 0 10*3/uL (ref 0–0.1)
Basophils Relative: 1 %
EOS ABS: 0.2 10*3/uL (ref 0–0.7)
EOS PCT: 3 %
HCT: 31.9 % — ABNORMAL LOW (ref 40.0–52.0)
Hemoglobin: 10 g/dL — ABNORMAL LOW (ref 13.0–18.0)
LYMPHS ABS: 0.5 10*3/uL — AB (ref 1.0–3.6)
Lymphocytes Relative: 6 %
MCH: 28.7 pg (ref 26.0–34.0)
MCHC: 31.5 g/dL — ABNORMAL LOW (ref 32.0–36.0)
MCV: 91 fL (ref 80.0–100.0)
Monocytes Absolute: 0.6 10*3/uL (ref 0.2–1.0)
Monocytes Relative: 7 %
Neutro Abs: 6.9 10*3/uL — ABNORMAL HIGH (ref 1.4–6.5)
Neutrophils Relative %: 83 %
PLATELETS: 106 10*3/uL — AB (ref 150–440)
RBC: 3.5 MIL/uL — AB (ref 4.40–5.90)
RDW: 15.8 % — ABNORMAL HIGH (ref 11.5–14.5)
WBC: 8.2 10*3/uL (ref 3.8–10.6)

## 2015-10-10 LAB — GLUCOSE, CAPILLARY
GLUCOSE-CAPILLARY: 281 mg/dL — AB (ref 65–99)
GLUCOSE-CAPILLARY: 348 mg/dL — AB (ref 65–99)

## 2015-10-10 LAB — MRSA PCR SCREENING: MRSA BY PCR: NEGATIVE

## 2015-10-10 MED ORDER — AMITRIPTYLINE HCL 25 MG PO TABS
25.0000 mg | ORAL_TABLET | Freq: Every day | ORAL | Status: DC
  Administered 2015-10-10 – 2015-10-16 (×7): 25 mg via ORAL
  Filled 2015-10-10 (×2): qty 1
  Filled 2015-10-10: qty 3
  Filled 2015-10-10 (×3): qty 1
  Filled 2015-10-10: qty 3
  Filled 2015-10-10: qty 1

## 2015-10-10 MED ORDER — ENOXAPARIN SODIUM 30 MG/0.3ML ~~LOC~~ SOLN: 30.0000 mg | SUBCUTANEOUS | Status: DC

## 2015-10-10 MED ORDER — ACETAMINOPHEN 500 MG PO TABS: 1000.0000 mg | ORAL_TABLET | Freq: Four times a day (QID) | ORAL | Status: DC | PRN

## 2015-10-10 MED ORDER — ISOSORBIDE DINITRATE 10 MG PO TABS
30.0000 mg | ORAL_TABLET | Freq: Every day | ORAL | Status: DC
  Administered 2015-10-10 – 2015-10-16 (×7): 30 mg via ORAL
  Filled 2015-10-10 (×4): qty 3
  Filled 2015-10-10 (×2): qty 1
  Filled 2015-10-10: qty 3
  Filled 2015-10-10: qty 1
  Filled 2015-10-10 (×2): qty 3
  Filled 2015-10-10: qty 1

## 2015-10-10 MED ORDER — PANTOPRAZOLE SODIUM 40 MG PO TBEC
40.0000 mg | DELAYED_RELEASE_TABLET | Freq: Every day | ORAL | Status: DC
  Administered 2015-10-10 – 2015-10-16 (×6): 40 mg via ORAL
  Filled 2015-10-10 (×8): qty 1

## 2015-10-10 MED ORDER — PIPERACILLIN-TAZOBACTAM 3.375 G IVPB: 3.3750 g | Freq: Three times a day (TID) | INTRAVENOUS | Status: DC

## 2015-10-10 MED ORDER — CLONIDINE HCL 0.1 MG PO TABS
0.1000 mg | ORAL_TABLET | Freq: Two times a day (BID) | ORAL | Status: DC
Start: 2015-10-10 — End: 2015-10-17
  Administered 2015-10-10 – 2015-10-16 (×13): 0.1 mg via ORAL
  Filled 2015-10-10 (×14): qty 1

## 2015-10-10 MED ORDER — PIPERACILLIN-TAZOBACTAM 3.375 G IVPB
3.3750 g | Freq: Once | INTRAVENOUS | Status: AC
  Administered 2015-10-10: 3.375 g via INTRAVENOUS
  Filled 2015-10-10: qty 50

## 2015-10-10 MED ORDER — INSULIN DETEMIR 100 UNIT/ML ~~LOC~~ SOLN
14.0000 [IU] | Freq: Every day | SUBCUTANEOUS | Status: DC
  Administered 2015-10-10: 14 [IU] via SUBCUTANEOUS
  Filled 2015-10-10 (×2): qty 0.14

## 2015-10-10 MED ORDER — FUROSEMIDE 40 MG PO TABS
80.0000 mg | ORAL_TABLET | Freq: Two times a day (BID) | ORAL | Status: DC
  Administered 2015-10-10 – 2015-10-16 (×13): 80 mg via ORAL
  Filled 2015-10-10 (×13): qty 2

## 2015-10-10 MED ORDER — ROSUVASTATIN CALCIUM 20 MG PO TABS
20.0000 mg | ORAL_TABLET | Freq: Every day | ORAL | Status: DC
  Administered 2015-10-10 – 2015-10-16 (×7): 20 mg via ORAL
  Filled 2015-10-10 (×7): qty 1

## 2015-10-10 MED ORDER — VANCOMYCIN HCL IN DEXTROSE 750-5 MG/150ML-% IV SOLN: 750.0000 mg | Freq: Two times a day (BID) | INTRAVENOUS | Status: DC

## 2015-10-10 MED ORDER — ALUM & MAG HYDROXIDE-SIMETH 200-200-20 MG/5ML PO SUSP: 30.0000 mL | ORAL | Status: DC | PRN

## 2015-10-10 MED ORDER — PIPERACILLIN-TAZOBACTAM 3.375 G IVPB
3.3750 g | Freq: Two times a day (BID) | INTRAVENOUS | Status: DC
  Administered 2015-10-11 – 2015-10-12 (×3): 3.375 g via INTRAVENOUS
  Filled 2015-10-10 (×4): qty 50

## 2015-10-10 MED ORDER — OMEGA-3-ACID ETHYL ESTERS 1 G PO CAPS
1.0000 g | ORAL_CAPSULE | Freq: Every day | ORAL | Status: DC
Start: 2015-10-10 — End: 2015-10-17
  Administered 2015-10-10 – 2015-10-16 (×7): 1 g via ORAL
  Filled 2015-10-10 (×8): qty 1

## 2015-10-10 MED ORDER — INSULIN ASPART 100 UNIT/ML ~~LOC~~ SOLN
0.0000 [IU] | Freq: Every day | SUBCUTANEOUS | Status: DC
  Administered 2015-10-10: 4 [IU] via SUBCUTANEOUS
  Administered 2015-10-13: 2 [IU] via SUBCUTANEOUS
  Administered 2015-10-14: 3 [IU] via SUBCUTANEOUS
  Administered 2015-10-15: 5 [IU] via SUBCUTANEOUS
  Administered 2015-10-16: 2 [IU] via SUBCUTANEOUS
  Filled 2015-10-10: qty 4
  Filled 2015-10-10: qty 2
  Filled 2015-10-10: qty 5

## 2015-10-10 MED ORDER — ALBUTEROL SULFATE (2.5 MG/3ML) 0.083% IN NEBU
5.0000 mg | INHALATION_SOLUTION | Freq: Once | RESPIRATORY_TRACT | Status: AC
  Administered 2015-10-10: 5 mg via RESPIRATORY_TRACT
  Filled 2015-10-10: qty 6

## 2015-10-10 MED ORDER — SEVELAMER CARBONATE 800 MG PO TABS
1600.0000 mg | ORAL_TABLET | Freq: Three times a day (TID) | ORAL | Status: DC
  Administered 2015-10-10 – 2015-10-17 (×17): 1600 mg via ORAL
  Filled 2015-10-10 (×17): qty 2

## 2015-10-10 MED ORDER — ONDANSETRON 4 MG PO TBDP
4.0000 mg | ORAL_TABLET | Freq: Three times a day (TID) | ORAL | Status: DC | PRN
  Filled 2015-10-10: qty 1

## 2015-10-10 MED ORDER — PROMETHAZINE HCL 25 MG PO TABS
25.0000 mg | ORAL_TABLET | Freq: Four times a day (QID) | ORAL | Status: DC | PRN
  Administered 2015-10-13: 25 mg via ORAL
  Filled 2015-10-10: qty 1

## 2015-10-10 MED ORDER — NITROGLYCERIN 0.4 MG SL SUBL: 0.4000 mg | SUBLINGUAL_TABLET | SUBLINGUAL | Status: DC | PRN

## 2015-10-10 MED ORDER — INSULIN ASPART 100 UNIT/ML ~~LOC~~ SOLN
0.0000 [IU] | Freq: Three times a day (TID) | SUBCUTANEOUS | Status: DC
  Administered 2015-10-12: 3 [IU] via SUBCUTANEOUS
  Administered 2015-10-12: 2 [IU] via SUBCUTANEOUS
  Administered 2015-10-14: 1 [IU] via SUBCUTANEOUS
  Administered 2015-10-14: 3 [IU] via SUBCUTANEOUS
  Administered 2015-10-14 – 2015-10-15 (×2): 2 [IU] via SUBCUTANEOUS
  Administered 2015-10-16: 3 [IU] via SUBCUTANEOUS
  Administered 2015-10-16 (×2): 5 [IU] via SUBCUTANEOUS
  Filled 2015-10-10 (×2): qty 3
  Filled 2015-10-10 (×2): qty 2
  Filled 2015-10-10: qty 3
  Filled 2015-10-10 (×2): qty 5
  Filled 2015-10-10: qty 1
  Filled 2015-10-10 (×2): qty 2
  Filled 2015-10-10: qty 3

## 2015-10-10 MED ORDER — CARVEDILOL 25 MG PO TABS
25.0000 mg | ORAL_TABLET | Freq: Two times a day (BID) | ORAL | Status: DC
  Administered 2015-10-10 – 2015-10-16 (×12): 25 mg via ORAL
  Filled 2015-10-10 (×12): qty 1

## 2015-10-10 MED ORDER — CLOPIDOGREL BISULFATE 75 MG PO TABS
75.0000 mg | ORAL_TABLET | Freq: Every day | ORAL | Status: DC
  Administered 2015-10-10 – 2015-10-16 (×7): 75 mg via ORAL
  Filled 2015-10-10 (×8): qty 1

## 2015-10-10 MED ORDER — ASPIRIN EC 325 MG PO TBEC
325.0000 mg | DELAYED_RELEASE_TABLET | Freq: Every day | ORAL | Status: DC
  Administered 2015-10-10 – 2015-10-16 (×7): 325 mg via ORAL
  Filled 2015-10-10 (×8): qty 1

## 2015-10-10 MED ORDER — CINACALCET HCL 30 MG PO TABS
30.0000 mg | ORAL_TABLET | Freq: Every day | ORAL | Status: DC
  Administered 2015-10-11 – 2015-10-14 (×4): 30 mg via ORAL
  Filled 2015-10-10 (×4): qty 1

## 2015-10-10 MED ORDER — INSULIN ASPART 100 UNIT/ML ~~LOC~~ SOLN: 0.0000 [IU] | Freq: Three times a day (TID) | SUBCUTANEOUS | Status: DC

## 2015-10-10 MED ORDER — VANCOMYCIN HCL IN DEXTROSE 750-5 MG/150ML-% IV SOLN
750.0000 mg | Freq: Two times a day (BID) | INTRAVENOUS | Status: DC
  Filled 2015-10-10: qty 150

## 2015-10-10 MED ORDER — VANCOMYCIN HCL IN DEXTROSE 1-5 GM/200ML-% IV SOLN
1000.0000 mg | INTRAVENOUS | Status: DC | PRN
  Administered 2015-10-11: 1000 mg via INTRAVENOUS
  Filled 2015-10-10 (×2): qty 200

## 2015-10-10 MED ORDER — INSULIN ASPART 100 UNIT/ML ~~LOC~~ SOLN: 12.0000 [IU] | Freq: Three times a day (TID) | SUBCUTANEOUS | Status: DC

## 2015-10-10 MED ORDER — VANCOMYCIN HCL 10 G IV SOLR
2000.0000 mg | Freq: Once | INTRAVENOUS | Status: AC
  Administered 2015-10-10: 2000 mg via INTRAVENOUS
  Filled 2015-10-10: qty 2000

## 2015-10-10 MED ORDER — CETYLPYRIDINIUM CHLORIDE 0.05 % MT LIQD
7.0000 mL | Freq: Two times a day (BID) | OROMUCOSAL | Status: DC
  Administered 2015-10-11 – 2015-10-15 (×4): 7 mL via OROMUCOSAL

## 2015-10-10 MED ORDER — PREGABALIN 75 MG PO CAPS
75.0000 mg | ORAL_CAPSULE | Freq: Two times a day (BID) | ORAL | Status: DC
  Administered 2015-10-10 – 2015-10-16 (×13): 75 mg via ORAL
  Filled 2015-10-10 (×14): qty 1

## 2015-10-10 MED ORDER — VANCOMYCIN HCL IN DEXTROSE 1-5 GM/200ML-% IV SOLN: 1000.0000 mg | INTRAVENOUS | Status: DC

## 2015-10-10 MED ORDER — LEVOTHYROXINE SODIUM 75 MCG PO TABS
75.0000 ug | ORAL_TABLET | Freq: Every day | ORAL | Status: DC
  Administered 2015-10-11 – 2015-10-16 (×6): 75 ug via ORAL
  Filled 2015-10-10 (×6): qty 1

## 2015-10-10 MED ORDER — HEPARIN SODIUM (PORCINE) 5000 UNIT/ML IJ SOLN
5000.0000 [IU] | Freq: Three times a day (TID) | INTRAMUSCULAR | Status: DC
  Filled 2015-10-10 (×3): qty 1

## 2015-10-10 MED ORDER — FUROSEMIDE 10 MG/ML IJ SOLN
40.0000 mg | Freq: Once | INTRAMUSCULAR | Status: AC
  Administered 2015-10-10: 40 mg via INTRAVENOUS
  Filled 2015-10-10: qty 4
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
## 2015-10-10 NOTE — ED Notes (Signed)
Pt reports starting with cough and shortness of breath approximately 1 week ago.  Pt reports worsening shortness of breath.  No fever, and coughing up clear sputum.

## 2015-10-10 NOTE — H&P (Signed)
[facility] - [place] at [facility]   PATIENT NAME: Taylor [name]    MR#:  [id number]  DATE OF BIRTH:  Sep 01, 1971  DATE OF ADMISSION:  10/10/2015  PRIMARY CARE PHYSICIAN: [name], MD   REQUESTING/REFERRING PHYSICIAN: Dr. Silverio Lay  CHIEF COMPLAINT: Shortness of breath    Chief Complaint  Patient presents with  . Shortness of Breath  . Cough    HISTORY OF PRESENT ILLNESS:  Taylor [name]  is a 44 y.o. male with a known history of htn, diabetes mellitus type 2, ESRD on hemodialysis comes in because of shortness of breath. Patient finished hemodialysis today, after that the noted to have shortness of breath because of that he was sent to the hospital. Patient has been having shortness of breath and cough for the past 1 week socially he was started on Levaquin as an outpatient for possible bronchitis, pneumonia. Patient took 1 tablet of Levaquin. Still having shortness of breath, cough with clear phlegm. No chest pain no fever. Patient finished for hemodialysis today. I spoke with Dr. Rexene Edison  lateef. He suggested that patient needs to be started on IV vancomycin and Zosyn for possible healthcare associated   Pneumonia. Also possibly scheduling for extra hemodialysis session tomorrow. Patient denies any nausea, vomiting, abdominal pain, diarrhea. Uses 3 L of oxygen all the time. Saturating 100% on 2 L.. Admitting for possible CHF exacerbation, early pneumonia.  PAST MEDICAL HISTORY:   Past Medical History  Diagnosis Date  . End stage renal disease on dialysis (HCC)     Taylor fistula  . Type I diabetes mellitus (HCC)     a. 03/2014 admitted with HNK to [facility]. b. TTS  . Diabetic neuropathy (HCC)     severe, s/p multiple toe amputation  . Hypothyroidism   . Hypertension   . Hyperlipidemia   . H/O hiatal hernia   . GERD (gastroesophageal reflux disease)   . Anxiety   . Sebaceous cyst     side of neck  . Pneumonia     2010  . Anemia     a. req PRBC's  2011.  [name] PAD (peripheral artery disease) (HCC)     a. s/p amputation of toes on the right;  b. left LE claudication.  . Coronary artery disease     a. s/p MI;  b. 10/2009 CABG x 3 @ [facility]: LIMA->LAD, VG->OM3, VG->RPDA; c. 11/2010 Cath 3/3 patent grafts;  d 12/2012 Cath: LM 30d, LAD 85p, D1 70, D2 90, LCX 40ost, OM2 100, RCA 90p, 130m, L->LAD ok, VG->OM3 ok, VG->RPDA 30, EF 50%->Med Rx.  . Cataract     right  . Valvular disease     a. 11/2012 Echo: EF 55-60%, mild LVH, mild MR, mild bi-atrial enlargement, mild-mod TR, PASP ; b. echo 03/2014: EF 50-55%, nl WM, select images concerning for bicuspid aortic valve w/ nl thicknes of leaflets, mild MR, mild bi-atrial dilatation, RV mild dilatation - wall thickness nl, mod TR - select images appears to be mod to sev, PASP at least mod elevated.  . Diastolic CHF (HCC)     see echo above  . Depression   . Arthritis     rheumatoid arthritis   . Myocardial infarction (HCC) 2010  . Pulmonary HTN (HCC)     a. continuation of 03/2014 echo PASP @ least mod elevated. RVSP 52 mm Hg. Parasternal long axis estimated @ 85 mm Hg  . Scrotal abscess     a. s/p multiple I&D  .  Penile abscess     a. s/p multiple I&D  . Hematemesis     a. EGD 2016 with LA Grade C esophagitis, continued on Protonix   . Chronic respiratory failure (HCC)     a. on 3L oxygen via nasal cannula  . Anxiety   . Acute delirium   . Sepsis (HCC)   . 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 MYOCARDIAL INFARCTION, HX OF 01/26/2011    Qualifier: Diagnosis of  By: [name] MD, [name]    . HTN (hypertension) 02/11/2011  . Pulmonary hypertension associated with end stage renal disease on dialysis (HCC) 04/16/2014  . Hyperkalemia   . Hypothyroidism   . Osteomyelitis (HCC)     PAST SURGICAL HISTOIRY:   Past Surgical History  Procedure Laterality Date  . Dialysis fistula creation      left upper arm fistula  . Amputation      TOES ON BOTH FEET  . Abscess drainage  Behind right ear/occipital scalp   . Skin graft    . Eye surgery  1999  . Coronary artery bypass graft  10/2009    [facility] (Dr. Katrinka Blazing)  . Foot amputation    . Cardiac catheterization    . Cardiac catheterization  2/14    [facility]: severe 3 vessel CAD with patent grafts, RHC: moderately elevated PCW and pulmonary hypertension  . Penile prosthesis implant N/A 07/24/2014    Procedure: SALINE PENILE INJECTION WITH DISSECTION OF CORPORA ,  IMPLANTATION OF COLOPLAST PENILE PROTHESIS INFLATABLE;  Surgeon: [name], MD;  Location: [facility] ORS;  Service: Urology;  Laterality: N/A;  . Removal of penile prosthesis N/A 08/22/2014    Procedure: EXPLANT OF INFECTED  PENILE PROSTHESIS;  Surgeon: [name], MD;  Location: [facility] ORS;  Service: Urology;  Laterality: N/A;  . Irrigation and debridement abscess Bilateral 12/14/2014    Procedure: Bilateral Corporal Irrigation with Cultures and Drainage, Penrose Drain Insertion;  Surgeon: [name], MD;  Location: [facility] OR;  Service: Urology;  Laterality: Bilateral;  . Scrotal exploration N/A 02/03/2015    Procedure: IRRIGATION AND DEBRIDEMENT SCROTAL/PENILE ABSCESS;  Surgeon: [name], MD;  Location: [facility] OR;  Service: Urology;  Laterality: N/A;  . Cystoscopy N/A 02/03/2015    Procedure: CYSTOSCOPY;  Surgeon: [name], MD;  Location: [facility] OR;  Service: Urology;  Laterality: N/A;  . Esophagogastroduodenoscopy N/A 03/18/2015    Procedure: ESOPHAGOGASTRODUODENOSCOPY (EGD);  Surgeon: [name], MD;  Location: [facility] ENDOSCOPY;  Service: Endoscopy;  Laterality: N/A;  . Cardiac catheterization N/A 05/27/2015    Procedure: Right Heart Cath;  Surgeon: [name], MD;  Location: [facility] INVASIVE CV LAB;  Service: Cardiovascular;  Laterality: N/A;    SOCIAL HISTORY:   Social History  Substance Use Topics  . Smoking status: Never Smoker   . Smokeless tobacco: Former Neurosurgeon    Types: Chew    Quit date: 08/23/1995  . Alcohol Use: No     Comment: occasional beer    FAMILY  HISTORY:   Family History  Problem Relation Age of Onset  . Heart disease Other   . Hypertension Other   . Hypertension Mother   . Heart disease Mother   . Diabetes Mother   . Birth defects Paternal Uncle     unaware  . Birth defects Paternal Grandmother     breast  . Kidney disease Neg Hx   . Prostate cancer Neg Hx     DRUG ALLERGIES:   Allergies  Allergen Reactions  .  Amoxicillin-Pot Clavulanate Nausea And Vomiting and Itching  . Rifampin Nausea And Vomiting  . Tape Other (See Comments)    REVIEW OF SYSTEMS:  CONSTITUTIONAL: No fever, fatigue or weakness.  EYES: No blurred or double vision.  EARS, NOSE, AND THROAT: No tinnitus or ear pain.  RESPIRATORY: Cough, shortness of breath for 1 week. CARDIOVASCULAR: No chest pain, orthopnea, edema.  GASTROINTESTINAL: No nausea, vomiting, diarrhea or abdominal pain.  GENITOURINARY: No dysuria, hematuria.  ENDOCRINE: No polyuria, nocturia,  HEMATOLOGY: No anemia, easy bruising or bleeding SKIN: No rash or lesion. MUSCULOSKELETAL: No joint pain or arthritis.   NEUROLOGIC: No tingling, numbness, weakness.  PSYCHIATRY: No anxiety or depression.   MEDICATIONS AT HOME:   Prior to Admission medications   Medication Sig Start Date End Date Taking? Authorizing Provider  acetaminophen (TYLENOL) 500 MG tablet Take 1,000 mg by mouth every 6 (six) hours as needed for mild pain or headache.    Yes Historical Provider, MD  alum & mag hydroxide-simeth (MAALOX PLUS) 400-400-40 MG/5ML suspension Take 30 mLs by mouth every 4 (four) hours as needed for indigestion.    Yes Historical Provider, MD  amitriptyline (ELAVIL) 25 MG tablet Take 1 tablet (25 mg total) by mouth at bedtime. 08/21/14  Yes Dianne Dun, MD  aspirin 325 MG EC tablet Take 325 mg by mouth daily.   Yes Historical Provider, MD  carvedilol (COREG) 25 MG tablet Take 1 tablet (25 mg total) by mouth 2 (two) times daily with a meal. 08/28/15  Yes [name], MD  cinacalcet  (SENSIPAR) 30 MG tablet Take 30 mg by mouth daily.   Yes Historical Provider, MD  cloNIDine (CATAPRES) 0.1 MG tablet Take 0.1 mg by mouth 2 (two) times daily.   Yes Historical Provider, MD  clopidogrel (PLAVIX) 75 MG tablet Take 1 tablet (75 mg total) by mouth daily. 09/11/15  Yes Dianne Dun, MD  DHA-EPA-VITAMIN E PO Take 1 capsule by mouth daily.   Yes Historical Provider, MD  furosemide (LASIX) 80 MG tablet Take 80 mg by mouth 2 (two) times daily.   Yes Historical Provider, MD  insulin aspart (NOVOLOG) 100 UNIT/ML injection Inject 12 Units into the skin 3 (three) times daily before meals.   Yes Historical Provider, MD  insulin detemir (LEVEMIR) 100 UNIT/ML injection Inject 0.14 mLs (14 Units total) into the skin at bedtime. 03/12/15  Yes Altamese Dilling, MD  isosorbide dinitrate (ISORDIL) 30 MG tablet Take 30 mg by mouth daily.   Yes Historical Provider, MD  levofloxacin (LEVAQUIN) 500 MG tablet Take 1 tablet by mouth every other day. 10/08/15  Yes Historical Provider, MD  levothyroxine (SYNTHROID, LEVOTHROID) 75 MCG tablet Take 1 tablet (75 mcg total) by mouth daily before breakfast. 07/26/13  Yes Dianne Dun, MD  nitroGLYCERIN (NITROSTAT) 0.4 MG SL tablet Place 0.4 mg under the tongue every 5 (five) minutes as needed for chest pain.   Yes Historical Provider, MD  Omega-3 Fatty Acids (FISH OIL) 1000 MG CAPS Take 1 capsule by mouth daily.   Yes Historical Provider, MD  omeprazole (PRILOSEC) 40 MG capsule Take 40 mg by mouth daily.   Yes Historical Provider, MD  pantoprazole (PROTONIX) 40 MG tablet Take 40 mg by mouth 2 (two) times daily.   Yes Historical Provider, MD  pregabalin (LYRICA) 75 MG capsule Take 75 mg by mouth 2 (two) times daily.    Yes Historical Provider, MD  rosuvastatin (CRESTOR) 20 MG tablet Take 1 tablet (20 mg total) by  mouth at bedtime. 12/13/12  Yes Dianne Dun, MD  sevelamer carbonate (RENVELA) 800 MG tablet Take 1,600 mg by mouth 3 (three) times daily with meals.   Yes  Historical Provider, MD  insulin aspart (NOVOLOG) 100 UNIT/ML injection Inject 0-7 Units into the skin 4 (four) times daily -  before meals and at bedtime. 03/12/15   Altamese Dilling, MD      VITAL SIGNS:  Blood pressure 179/82, pulse 78, temperature 97.7 F (36.5 C), temperature source Oral, resp. rate 18, height  (1.905 m), weight 86.183 kg (190 lb), SpO2 99 %.  PHYSICAL EXAMINATION:  GENERAL:  44 y.o.-year-old patient lying in the bed with no acute distress.  EYES: Pupils equal, round, reactive to light and accommodation. No scleral icterus. Extraocular muscles intact.  HEENT: Head atraumatic, normocephalic. Oropharynx and nasopharynx clear.  NECK:  Supple, no jugular venous distention. No thyroid enlargement, no tenderness.  LUNGS: Decreased breath sounds at bases. CARDIOVASCULAR: S1, S2 normal. No murmurs, rubs, or gallops.  ABDOMEN: Soft, nontender, nondistended. Bowel sounds present. No organomegaly or mass.  EXTREMITIES: BilateralEdema present NEUROLOGIC: Cranial nerves II through XII are intact. Muscle strength 5/5 in all extremities. Sensation intact. Gait not checked.  PSYCHIATRIC: The patient is alert and oriented x 3.  SKIN: No obvious rash, lesion, or ulcer.   LABORATORY PANEL:   CBC  Recent Labs Lab 10/10/15 1418  WBC 8.2  HGB 10.0*  HCT 31.9*  PLT 106*   ------------------------------------------------------------------------------------------------------------------  Chemistries   Recent Labs Lab 10/10/15 1418  NA 134*  K 3.6  CL 92*  CO2 31  GLUCOSE 309*  BUN 19  CREATININE 2.41*  CALCIUM 8.4*   ------------------------------------------------------------------------------------------------------------------  Cardiac Enzymes No results for input(s): TROPONINI in the last 168 hours. ------------------------------------------------------------------------------------------------------------------  RADIOLOGY:  Dg Chest 2  View  10/10/2015  CLINICAL DATA:  Short of breath EXAM: CHEST  2 VIEW COMPARISON:  06/13/2015 FINDINGS: Postop CABG. Cardiac enlargement with mild vascular congestion. Mild interstitial edema. Small bilateral effusions. Overall improvement in pulmonary edema since the prior study. IMPRESSION: Congestive heart failure with mild interstitial edema and small bilateral pleural effusions. Electronically Signed   By: Marlan Palau M.D.   On: 10/10/2015 13:24    EKG:   Orders placed or performed during the hospital encounter of 10/10/15  . ED EKG  . ED EKG    IMPRESSION AND PLAN:   #1 shortness of breath today despite full hemodialysis. Chest x-ray congestive heart failure, admitted to hospitalist service for acute on chronic*diastolic heart failure: Repeat echocardiogram, already received IV Lasix in the emergency room continue by mouth Lasix 80 mg twice a day. And the consult nephrology for extra hemodialysis tomorrow. #2 bronchitis/possible early pneumonia: Antibiotics are changed to vancomycin and Zosyn as per nephrology recommendation: Continue oxygen. Recheck the x-ray chest tomorrow. #3 diabetes mellitus type 2: Continue NovoLog with meal coverage,Levemir #4 hypothyroidism continue Synthroid 75 mg by mouth daily #5 hypertension controlled continue clonidine, Coreg, Isordil, Lasix. #6 history of left carotid stenosis: Recently started on Plavix 3 weeks ago. Continue that.  All the records are reviewed and case discussed with ED provider. Management plans discussed with the patient, family and they are in agreement.  CODE STATUS: Full  TOTAL TIME TAKING CARE OF THIS PATIENT:70 minutes.    Katha Hamming M.D on 10/10/2015 at 3:27 PM  Between 7am to 6pm - Pager - [phone]  After 6pm go to [email or web address] - Scientist, research (life sciences) Hospitalists  Office  [phone]  CC: Primary care physician; [name], MD  Note: This dictation was prepared with Dragon dictation  along with smaller phrase technology. Any transcriptional errors that result from this process are unintentional.

## 2015-10-10 NOTE — Progress Notes (Signed)
Per Dr. Luberta Mutter, put patient on sensitive sliding scale

## 2015-10-10 NOTE — Progress Notes (Signed)
Patient admitted to unit. Oriented to room, call bell, and staff. Bed in lowest position. Fall safety plan reviewed. Full assessment to Epic. Skin assessment verified with Caswell Corwin RN. Telemetry box verification with tele clerk. Will continue to monitor.

## 2015-10-10 NOTE — Consult Note (Signed)
ANTIBIOTIC CONSULT NOTE - INITIAL  Pharmacy Consult for vancomycin and zosyn Indication: pneumonia  Allergies  Allergen Reactions  . Amoxicillin-Pot Clavulanate Nausea And Vomiting and Itching  . Rifampin Nausea And Vomiting  . Tape Other (See Comments)    Patient Measurements: Height: 6\' 3"  (190.5 cm) Weight: 190 lb (86.183 kg) IBW/kg (Calculated) : 84.5 Adjusted Body Weight:   Vital Signs: Temp: 97.7 F (36.5 C) (12/01 1109) Temp Source: Oral (12/01 1109) BP: 179/82 mmHg (12/01 1423) Pulse Rate: 78 (12/01 1423) Intake/Output from previous day:   Intake/Output from this shift:    Labs:  Recent Labs  10/10/15 1418  WBC 8.2  HGB 10.0*  PLT 106*  CREATININE 2.41*   Estimated Creatinine Clearance: 45.8 mL/min (by C-G formula based on Cr of 2.41). No results for input(s): VANCOTROUGH, VANCOPEAK, VANCORANDOM, GENTTROUGH, GENTPEAK, GENTRANDOM, TOBRATROUGH, TOBRAPEAK, TOBRARND, AMIKACINPEAK, AMIKACINTROU, AMIKACIN in the last 72 hours.   Microbiology: No results found for this or any previous visit (from the past 720 hour(s)).  Medical History: Past Medical History  Diagnosis Date  . End stage renal disease on dialysis (HCC)     LUE fistula  . Type I diabetes mellitus (HCC)     a. 03/2014 admitted with HNK to [facility]. b. TTS  . Diabetic neuropathy (HCC)     severe, s/p multiple toe amputation  . Hypothyroidism   . Hypertension   . Hyperlipidemia   . H/O hiatal hernia   . GERD (gastroesophageal reflux disease)   . Anxiety   . Sebaceous cyst     side of neck  . Pneumonia     2010  . Anemia     a. req PRBC's 2011.  2012 PAD (peripheral artery disease) (HCC)     a. s/p amputation of toes on the right;  b. left LE claudication.  . Coronary artery disease     a. s/p MI;  b. 10/2009 CABG x 3 @ [facility]: LIMA->LAD, VG->OM3, VG->RPDA; c. 11/2010 Cath 3/3 patent grafts;  d 12/2012 Cath: LM 30d, LAD 85p, D1 70, D2 90, LCX 40ost, OM2 100, RCA 90p, 122m, L->LAD ok, VG->OM3 ok,  VG->RPDA 30, EF 50%->Med Rx.  . Cataract     right  . Valvular disease     a. 11/2012 Echo: EF 55-60%, mild LVH, mild MR, mild bi-atrial enlargement, mild-mod TR, PASP 12/2012; b. echo 03/2014: EF 50-55%, nl WM, select images concerning for bicuspid aortic valve w/ nl thicknes of leaflets, mild MR, mild bi-atrial dilatation, RV mild dilatation - wall thickness nl, mod TR - select images appears to be mod to sev, PASP at least mod elevated.  . Diastolic CHF (HCC)     see echo above  . Depression   . Arthritis     rheumatoid arthritis   . Myocardial infarction (HCC) 2010  . Pulmonary HTN (HCC)     a. continuation of 03/2014 echo PASP @ least mod elevated. RVSP 52 mm Hg. Parasternal long axis estimated @ 85 mm Hg  . Scrotal abscess     a. s/p multiple I&D  . Penile abscess     a. s/p multiple I&D  . Hematemesis     a. EGD 2016 with LA Grade C esophagitis, continued on Protonix   . Chronic respiratory failure (HCC)     a. on 3L oxygen via nasal cannula  . Anxiety   . Acute delirium   . Sepsis (HCC)   . 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 MYOCARDIAL INFARCTION, HX  OF 01/26/2011    Qualifier: Diagnosis of  By: [name] MD, [name]    . HTN (hypertension) 02/11/2011  . Pulmonary hypertension associated with end stage renal disease on dialysis (HCC) 04/16/2014  . Hyperkalemia   . Hypothyroidism   . Osteomyelitis (HCC)     Medications:  Scheduled:  . amitriptyline  25 mg Oral QHS  . aspirin  325 mg Oral Daily  . carvedilol  25 mg Oral BID WC  . cinacalcet  30 mg Oral Daily  . cloNIDine  0.1 mg Oral BID  . clopidogrel  75 mg Oral Daily  . enoxaparin (LOVENOX) injection  30 mg Subcutaneous Q24H  . furosemide  80 mg Oral BID  . insulin aspart  0-7 Units Subcutaneous TID AC & HS  . insulin detemir  14 Units Subcutaneous QHS  . isosorbide dinitrate  30 mg Oral Daily  . [START ON 10/11/2015] levothyroxine  75 mcg Oral QAC breakfast  . omega-3 acid ethyl esters  1 g Oral Daily  . pantoprazole  40  mg Oral Daily  . pregabalin  75 mg Oral BID  . rosuvastatin  20 mg Oral QHS  . sevelamer carbonate  1,600 mg Oral TID WC   Assessment: Pt is a 44 year old male with possible HCAP. Pt has ESRD on HD. Pharmacy consulted to dose vancomycin and zosyn.   Goal of Therapy:  pre HD level 15-25  Plan:  Measure antibiotic drug levels at steady state Follow up culture results Will load pt with 2000mg  of vancomycin and then give 1000mg  AFTER HD sessions. Zosyn 3.375g q 12 hours. Will draw vanc level prior to 3rd HD session  Laraya Pestka D Shelsey Rieth 10/10/2015,3:51 PM

## 2015-10-11 ENCOUNTER — Inpatient Hospital Stay (HOSPITAL_COMMUNITY)
Admit: 2015-10-11 | Discharge: 2015-10-11 | Disposition: A | Discharge status: Home / Self Care | Payer: Medicare Other | Attending: Internal Medicine | Admitting: Internal Medicine

## 2015-10-11 DIAGNOSIS — L8990 Pressure ulcer of unspecified site, unspecified stage: Secondary | ICD-10-CM | POA: Insufficient documentation

## 2015-10-11 DIAGNOSIS — R0600 Dyspnea, unspecified: Secondary | ICD-10-CM

## 2015-10-11 LAB — CBC
HCT: 30.2 % — ABNORMAL LOW (ref 40.0–52.0)
HEMOGLOBIN: 9.4 g/dL — AB (ref 13.0–18.0)
MCH: 28.6 pg (ref 26.0–34.0)
MCHC: 31.3 g/dL — AB (ref 32.0–36.0)
MCV: 91.4 fL (ref 80.0–100.0)
Platelets: 111 10*3/uL — ABNORMAL LOW (ref 150–440)
RBC: 3.3 MIL/uL — ABNORMAL LOW (ref 4.40–5.90)
RDW: 15.9 % — AB (ref 11.5–14.5)
WBC: 5.8 10*3/uL (ref 3.8–10.6)

## 2015-10-11 LAB — BASIC METABOLIC PANEL
ANION GAP: 10 (ref 5–15)
BUN: 25 mg/dL — AB (ref 6–20)
CALCIUM: 8.7 mg/dL — AB (ref 8.9–10.3)
CO2: 30 mmol/L (ref 22–32)
Chloride: 94 mmol/L — ABNORMAL LOW (ref 101–111)
Creatinine, Ser: 2.78 mg/dL — ABNORMAL HIGH (ref 0.61–1.24)
GFR calc Af Amer: 30 mL/min — ABNORMAL LOW (ref >60)
GFR, EST NON AFRICAN AMERICAN: 26 mL/min — AB (ref >60)
GLUCOSE: 272 mg/dL — AB (ref 65–99)
Potassium: 3.5 mmol/L (ref 3.5–5.1)
Sodium: 134 mmol/L — ABNORMAL LOW (ref 135–145)

## 2015-10-11 LAB — PHOSPHORUS: Phosphorus: 3.6 mg/dL (ref 2.5–4.6)

## 2015-10-11 LAB — GLUCOSE, CAPILLARY
GLUCOSE-CAPILLARY: 79 mg/dL (ref 65–99)
GLUCOSE-CAPILLARY: 84 mg/dL (ref 65–99)
GLUCOSE-CAPILLARY: 179 mg/dL — AB (ref 65–99)
Glucose-Capillary: 190 mg/dL — ABNORMAL HIGH (ref 65–99)

## 2015-10-11 MED ORDER — LIDOCAINE-PRILOCAINE 2.5-2.5 % EX CREA
1.0000 | TOPICAL_CREAM | CUTANEOUS | Status: DC | PRN
Start: 2015-10-11 — End: 2015-10-17

## 2015-10-11 MED ORDER — PENTAFLUOROPROP-TETRAFLUOROETH EX AERO: 1.0000 "application " | INHALATION_SPRAY | CUTANEOUS | Status: DC | PRN

## 2015-10-11 MED ORDER — LIDOCAINE HCL (PF) 1 % IJ SOLN: 5.0000 mL | INTRAMUSCULAR | Status: DC | PRN

## 2015-10-11 MED ORDER — SODIUM CHLORIDE 0.9 % IV SOLN: 100.0000 mL | INTRAVENOUS | Status: DC | PRN

## 2015-10-11 MED ORDER — HEPARIN SODIUM (PORCINE) 1000 UNIT/ML IJ SOLN: 1000.0000 [IU] | INTRAMUSCULAR | Status: DC | PRN

## 2015-10-11 MED ORDER — INSULIN DETEMIR 100 UNIT/ML ~~LOC~~ SOLN
20.0000 [IU] | Freq: Every day | SUBCUTANEOUS | Status: DC
  Administered 2015-10-11 – 2015-10-16 (×6): 20 [IU] via SUBCUTANEOUS
  Filled 2015-10-11 (×7): qty 0.2

## 2015-10-11 MED ORDER — ALTEPLASE 2 MG IJ SOLR: 2.0000 mg | Freq: Once | INTRAMUSCULAR | Status: DC | PRN

## 2015-10-11 NOTE — Consult Note (Signed)
ANTIBIOTIC CONSULT NOTE - INITIAL  Pharmacy Consult for vancomycin and zosyn Indication: pneumonia  Allergies  Allergen Reactions  . Amoxicillin-Pot Clavulanate Nausea And Vomiting and Itching  . Rifampin Nausea And Vomiting  . Tape Other (See Comments)    Patient Measurements: Height: 6\' 3"  (190.5 cm) Weight: 174 lb 6.1 oz (79.1 kg) IBW/kg (Calculated) : 84.5 Adjusted Body Weight:   Vital Signs: Temp: 97.8 F (36.6 C) (12/02 1010) Temp Source: Oral (12/02 1010) BP: 133/67 mmHg (12/02 1030) Pulse Rate: 73 (12/02 1030) Intake/Output from previous day:   Intake/Output from this shift:    Labs:  Recent Labs  10/10/15 1418 10/11/15 0358  WBC 8.2 5.8  HGB 10.0* 9.4*  PLT 106* 111*  CREATININE 2.41* 2.78*   Estimated Creatinine Clearance: 37.1 mL/min (by C-G formula based on Cr of 2.78). No results for input(s): VANCOTROUGH, VANCOPEAK, VANCORANDOM, GENTTROUGH, GENTPEAK, GENTRANDOM, TOBRATROUGH, TOBRAPEAK, TOBRARND, AMIKACINPEAK, AMIKACINTROU, AMIKACIN in the last 72 hours.   Microbiology: Recent Results (from the past 720 hour(s))  MRSA PCR Screening     Status: None   Collection Time: 10/10/15  6:02 PM  Result Value Ref Range Status   MRSA by PCR NEGATIVE NEGATIVE Final    Comment:        The GeneXpert MRSA Assay (FDA approved for NASAL specimens only), is one component of a comprehensive MRSA colonization surveillance program. It is not intended to diagnose MRSA infection nor to guide or monitor treatment for MRSA infections.     Medical History: Past Medical History  Diagnosis Date  . End stage renal disease on dialysis (HCC)     LUE fistula  . Type I diabetes mellitus (HCC)     a. 03/2014 admitted with HNK to [facility]. b. TTS  . Diabetic neuropathy (HCC)     severe, s/p multiple toe amputation  . Hypothyroidism   . Hypertension   . Hyperlipidemia   . H/O hiatal hernia   . GERD (gastroesophageal reflux disease)   . Anxiety   . Sebaceous cyst    side of neck  . Pneumonia     2010  . Anemia     a. req PRBC's 2011.  2012 PAD (peripheral artery disease) (HCC)     a. s/p amputation of toes on the right;  b. left LE claudication.  . Coronary artery disease     a. s/p MI;  b. 10/2009 CABG x 3 @ [facility]: LIMA->LAD, VG->OM3, VG->RPDA; c. 11/2010 Cath 3/3 patent grafts;  d 12/2012 Cath: LM 30d, LAD 85p, D1 70, D2 90, LCX 40ost, OM2 100, RCA 90p, 166m, L->LAD ok, VG->OM3 ok, VG->RPDA 30, EF 50%->Med Rx.  . Cataract     right  . Valvular disease     a. 11/2012 Echo: EF 55-60%, mild LVH, mild MR, mild bi-atrial enlargement, mild-mod TR, PASP 12/2012; b. echo 03/2014: EF 50-55%, nl WM, select images concerning for bicuspid aortic valve w/ nl thicknes of leaflets, mild MR, mild bi-atrial dilatation, RV mild dilatation - wall thickness nl, mod TR - select images appears to be mod to sev, PASP at least mod elevated.  . Diastolic CHF (HCC)     see echo above  . Depression   . Arthritis     rheumatoid arthritis   . Myocardial infarction (HCC) 2010  . Pulmonary HTN (HCC)     a. continuation of 03/2014 echo PASP @ least mod elevated. RVSP 52 mm Hg. Parasternal long axis estimated @ 85 mm Hg  . Scrotal abscess  a. s/p multiple I&D  . Penile abscess     a. s/p multiple I&D  . Hematemesis     a. EGD 2016 with LA Grade C esophagitis, continued on Protonix   . Chronic respiratory failure (HCC)     a. on 3L oxygen via nasal cannula  . Anxiety   . Acute delirium   . Sepsis (HCC)   . 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 MYOCARDIAL INFARCTION, HX OF 01/26/2011    Qualifier: Diagnosis of  By: [name] MD, [name]    . HTN (hypertension) 02/11/2011  . Pulmonary hypertension associated with end stage renal disease on dialysis (HCC) 04/16/2014  . Hyperkalemia   . Hypothyroidism   . Osteomyelitis (HCC)     Medications:  Scheduled:  . amitriptyline  25 mg Oral QHS  . antiseptic oral rinse  7 mL Mouth Rinse BID  . aspirin  325 mg Oral Daily  . carvedilol  25 mg Oral  BID WC  . cinacalcet  30 mg Oral Q breakfast  . cloNIDine  0.1 mg Oral BID  . clopidogrel  75 mg Oral Daily  . furosemide  80 mg Oral BID  . heparin subcutaneous  5,000 Units Subcutaneous 3 times per day  . insulin aspart  0-5 Units Subcutaneous QHS  . insulin aspart  0-9 Units Subcutaneous TID WC  . insulin detemir  14 Units Subcutaneous QHS  . isosorbide dinitrate  30 mg Oral Daily  . levothyroxine  75 mcg Oral QAC breakfast  . omega-3 acid ethyl esters  1 g Oral Daily  . pantoprazole  40 mg Oral Daily  . piperacillin-tazobactam (ZOSYN)  IV  3.375 g Intravenous Q12H  . pregabalin  75 mg Oral BID  . rosuvastatin  20 mg Oral QHS  . sevelamer carbonate  1,600 mg Oral TID WC   Assessment: Pt is a 44 year old male with possible HCAP. Pt has ESRD on HD. Pharmacy consulted to dose vancomycin and zosyn.   Goal of Therapy:  pre HD level 15-25  Plan:  Measure antibiotic drug levels at steady state Follow up culture results Patient on HD TThSa PTA to receive extra HD session today. Vancomycin ordered 1000 mg prn HD. Will f/u to ensure that patient receives vancomycin and tolerates a full dialysis session. Will need to continue to ensure that vancomycin is given with each HD session. Will continue Zosyn 3.375 g EI q 12 hours.   Luisa Hart D 10/11/2015,10:49 AM

## 2015-10-11 NOTE — Progress Notes (Signed)
*  PRELIMINARY RESULTS* Echocardiogram 2D Echocardiogram has been performed.  Taylor [name] 10/11/2015, 8:13 AM

## 2015-10-11 NOTE — Care Management (Signed)
Patient presents from home.  He  lives with his parents.  His dad assists him with his personal care.  Patient ambulates with a walker.  His parents take him to MD appointments.  His suprapubic catheter is changed monthly in physician's office.  Receives dialysis T TH S at [facility] on [address] and states he is compliant with treatment regime.  Has had a stay in a skilled nursing facility.  His current 02 requirement is acute.  Discussed during progression to wean and assess for home 02.  He currently does not have any community agencies providing services in the home. He resides in [place] and actual street address is [address]

## 2015-10-11 NOTE — Progress Notes (Addendum)
Patient ID: Taylor [name], male   DOB: [birth year], 44 y.o.   MRN: [id number] [facility] - [place] at [facility]   PATIENT NAME: Taylor [name]    MR#:  [id number]  DATE OF BIRTH:  [birth year]  SUBJECTIVE:   Doing well no complaints coughing up clear phlegm REVIEW OF SYSTEMS:   Review of Systems  Constitutional: Negative for fever, chills and weight loss.  HENT: Negative for ear discharge, ear pain and nosebleeds.   Eyes: Negative for blurred vision, pain and discharge.  Respiratory: Negative for sputum production, shortness of breath, wheezing and stridor.   Cardiovascular: Negative for chest pain, palpitations, orthopnea and PND.  Gastrointestinal: Negative for nausea, vomiting, abdominal pain and diarrhea.  Genitourinary: Negative for urgency and frequency.  Musculoskeletal: Negative for back pain and joint pain.  Neurological: Negative for sensory change, speech change, focal weakness and weakness.  Psychiatric/Behavioral: Negative for depression and hallucinations. The patient is not nervous/anxious.   All other systems reviewed and are negative.  Tolerating Diet: Yes Tolerating PT: Yes  DRUG ALLERGIES:   Allergies  Allergen Reactions  . Amoxicillin-Pot Clavulanate Nausea And Vomiting and Itching  . Rifampin Nausea And Vomiting  . Tape Other (See Comments)    VITALS:  Blood pressure 136/59, pulse 73, temperature 97.8 F (36.6 C), temperature source Oral, resp. rate 18, height 6\' 3"  (1.905 m), weight 79.1 kg (174 lb 6.1 oz), SpO2 100 %.  PHYSICAL EXAMINATION:   Physical Exam  GENERAL:  44 y.o.-year-old patient lying in the bed with no acute distress.  EYES: Pupils equal, round, reactive to light and accommodation. No scleral icterus. Extraocular muscles intact.  HEENT: Head atraumatic, normocephalic. Oropharynx and nasopharynx clear.  NECK:  Supple, no jugular venous distention. No thyroid enlargement, no tenderness.  LUNGS:  Normal breath sounds bilaterally, no wheezing, rales, rhonchi. No use of accessory muscles of respiration.  CARDIOVASCULAR: S1, S2 normal. No murmurs, rubs, or gallops.  ABDOMEN: Soft, nontender, nondistended. Bowel sounds present. No organomegaly or mass.  EXTREMITIES: No cyanosis, clubbing or edema b/l.    NEUROLOGIC: Cranial nerves II through XII are intact. No focal Motor or sensory deficits b/l.   PSYCHIATRIC: The patient is alert and oriented x 3.  SKIN: No obvious rash, lesion, or ulcer.    LABORATORY PANEL:   CBC  Recent Labs Lab 10/11/15 0358  WBC 5.8  HGB 9.4*  HCT 30.2*  PLT 111*    Chemistries   Recent Labs Lab 10/11/15 0358  NA 134*  K 3.5  CL 94*  CO2 30  GLUCOSE 272*  BUN 25*  CREATININE 2.78*  CALCIUM 8.7*    Cardiac Enzymes No results for input(s): TROPONINI in the last 168 hours.  RADIOLOGY:  Dg Chest 2 View  10/10/2015  CLINICAL DATA:  Short of breath EXAM: CHEST  2 VIEW COMPARISON:  06/13/2015 FINDINGS: Postop CABG. Cardiac enlargement with mild vascular congestion. Mild interstitial edema. Small bilateral effusions. Overall improvement in pulmonary edema since the prior study. IMPRESSION: Congestive heart failure with mild interstitial edema and small bilateral pleural effusions. Electronically Signed   By: 08/13/2015 M.D.   On: 10/10/2015 13:24     ASSESSMENT AND PLAN:  Taylor [name] is a 44 y.o. male with a known history of htn, diabetes mellitus type 2, ESRD on hemodialysis comes in because of shortness of breath. Patient finished hemodialysis today, after that the noted to have shortness of breath   #1 shortness of breath today suspected due to  congestive heart failure diastolic acute on chronic -Patient underwent dialysis today with ultrafiltration feels a lot better. His sats are 100% on 3 L nasal cannula. -He is on chronic home oxygen.  -x-ray congestive heart failure,no findings of pneumonia. He is empirically on  broad-spectrum empiric vancomycin and Zosyn. Patient remains afebrile I will discontinue antibiotics to seems to be more congestive heart failure.   #2 bronchitis/possible early pneumonia: Antibiotics are changed to vancomycin and Zosyn as per nephrology recommendation: Continue oxygen. Patient clinically does not have any signs symptoms of bronchitis or pneumonia at present. I'll continue antibiotics if he remains afebrile we'll DC it in the morning.   #3 diabetes mellitus type 2: Continue NovoLog with meal coverage,Levemir  #4 hypothyroidism continue Synthroid 75 mg by mouth daily  #5 hypertension controlled continue clonidine, Coreg, Isordil, Lasix.  #6 history of left carotid stenosis: Recently started on Plavix 3 weeks ago. Continue that.  Case discussed with Care Management/Social Worker. Management plans discussed with the patient,and they are in agreement.  CODE STATUS: Full DVT Prophylaxis: heparin  TOTAL TIME TAKING CARE OF THIS PATIENT:  30 minutes.  >50% time spent on counselling and coordination of care  POSSIBLE D/C IN1-2 DAYS, DEPENDING ON CLINICAL CONDITION.   Taylor [name] M.D on 10/11/2015 at 3:50 PM  Between 7am to 6pm - Pager - [phone]  After 6pm go to [email or web address] - password [email or web address] [facility]  [place] [facility]  Office  [phone]  CC: Primary care physician; [name], MD

## 2015-10-11 NOTE — Progress Notes (Signed)
[facility]  ROUNDING NOTE   Subjective:  Patient seen at bedside. He is very well known to [name] as we follow him for outpatient dialysis. Patient was complaining of shortness of breath at dialysis.  Had HD yesterday and today. Started on empiric abx for possible pneumonia as well.    Objective:  Vital signs in last 24 hours:  Temp:  [97.5 F (36.4 C)-98.1 F (36.7 C)] 97.8 F (36.6 C) (12/02 1247) Pulse Rate:  [70-79] 74 (12/02 1654) Resp:  [16-27] 18 (12/02 1247) BP: (115-161)/(59-75) 151/75 mmHg (12/02 1654) SpO2:  [100 %] 100 % (12/02 1247) Weight:  [76.386 kg (168 lb 6.4 oz)-79.1 kg (174 lb 6.1 oz)] 79.1 kg (174 lb 6.1 oz) (12/02 1010)  Weight change:  Filed Weights   10/10/15 1109 10/10/15 1803 10/11/15 1010  Weight: 86.183 kg (190 lb) 76.386 kg (168 lb 6.4 oz) 79.1 kg (174 lb 6.1 oz)    Intake/Output:     Intake/Output this shift:  Total I/O In: 240 [P.O.:240] Out: 1050 [Other:1050]  Physical Exam: General: NAD, laying bed  Head: Normocephalic, atraumatic. Moist oral mucosal membranes  Eyes: Anicteric  Neck: Supple, trachea midline  Lungs:  Basilar rales  Heart: Regular rate and rhythm  Abdomen:  Soft, nontender, BS present   Extremities: 1+ peripheral edema.  Neurologic: Nonfocal, moving all four extremities  Skin: No lesions  Access: LUE AVF    Basic Metabolic Panel:  Recent Labs Lab 10/10/15 1418 10/11/15 0358 10/11/15 1014  NA 134* 134*  --   K 3.6 3.5  --   CL 92* 94*  --   CO2 31 30  --   GLUCOSE 309* 272*  --   BUN 19 25*  --   CREATININE 2.41* 2.78*  --   CALCIUM 8.4* 8.7*  --   PHOS  --   --  3.6    Liver Function Tests: No results for input(s): AST, ALT, ALKPHOS, BILITOT, PROT, ALBUMIN in the last 168 hours. No results for input(s): LIPASE, AMYLASE in the last 168 hours. No results for input(s): AMMONIA in the last 168 hours.  CBC:  Recent Labs Lab 10/10/15 1418 10/11/15 0358  WBC 8.2 5.8  NEUTROABS 6.9*  --    HGB 10.0* 9.4*  HCT 31.9* 30.2*  MCV 91.0 91.4  PLT 106* 111*    Cardiac Enzymes: No results for input(s): CKTOTAL, CKMB, CKMBINDEX, TROPONINI in the last 168 hours.  BNP: Invalid input(s): POCBNP  CBG:  Recent Labs Lab 10/10/15 1428 10/10/15 2203 10/11/15 1246 10/11/15 1635  GLUCAP 281* 348* 79 84    Microbiology: Results for orders placed or performed during the hospital encounter of 10/10/15  MRSA PCR Screening     Status: None   Collection Time: 10/10/15  6:02 PM  Result Value Ref Range Status   MRSA by PCR NEGATIVE NEGATIVE Final    Comment:        The GeneXpert MRSA Assay (FDA approved for NASAL specimens only), is one component of a comprehensive MRSA colonization surveillance program. It is not intended to diagnose MRSA infection nor to guide or monitor treatment for MRSA infections.     Coagulation Studies: No results for input(s): LABPROT, INR in the last 72 hours.  Urinalysis:  Recent Labs  10/10/15 1500  COLORURINE YELLOW*  LABSPEC 1.006  PHURINE 7.0  GLUCOSEU >500*  HGBUR 3+*  BILIRUBINUR NEGATIVE  KETONESUR NEGATIVE  PROTEINUR 100*  NITRITE NEGATIVE  LEUKOCYTESUR 3+*  Imaging: Dg Chest 2 View  10/10/2015  CLINICAL DATA:  Short of breath EXAM: CHEST  2 VIEW COMPARISON:  06/13/2015 FINDINGS: Postop CABG. Cardiac enlargement with mild vascular congestion. Mild interstitial edema. Small bilateral effusions. Overall improvement in pulmonary edema since the prior study. IMPRESSION: Congestive heart failure with mild interstitial edema and small bilateral pleural effusions. Electronically Signed   By: Marlan Palau M.D.   On: 10/10/2015 13:24     Medications:     . amitriptyline  25 mg Oral QHS  . antiseptic oral rinse  7 mL Mouth Rinse BID  . aspirin  325 mg Oral Daily  . carvedilol  25 mg Oral BID WC  . cinacalcet  30 mg Oral Q breakfast  . cloNIDine  0.1 mg Oral BID  . clopidogrel  75 mg Oral Daily  . furosemide  80 mg  Oral BID  . heparin subcutaneous  5,000 Units Subcutaneous 3 times per day  . insulin aspart  0-5 Units Subcutaneous QHS  . insulin aspart  0-9 Units Subcutaneous TID WC  . insulin detemir  20 Units Subcutaneous QHS  . isosorbide dinitrate  30 mg Oral Daily  . levothyroxine  75 mcg Oral QAC breakfast  . omega-3 acid ethyl esters  1 g Oral Daily  . pantoprazole  40 mg Oral Daily  . piperacillin-tazobactam (ZOSYN)  IV  3.375 g Intravenous Q12H  . pregabalin  75 mg Oral BID  . rosuvastatin  20 mg Oral QHS  . sevelamer carbonate  1,600 mg Oral TID WC   sodium chloride, sodium chloride, acetaminophen, alteplase, alum & mag hydroxide-simeth, heparin, lidocaine (PF), lidocaine-prilocaine, nitroGLYCERIN, ondansetron, pentafluoroprop-tetrafluoroeth, promethazine, vancomycin  Assessment/ Plan:  44 y.o. 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  End-stage renal disease on hemodialysis Tuesday, Thursday, Saturday.  The patient has undergone dialysis the past 2 days.  Significant volume removal has occurred.  Patient reports feeling better.  We will plan for dialysis treatment tomorrow as well.  2.  Anemia chronic kidney disease.  Hemoglobin noted as being 9.4.  We will resume Epogen with dialysis tomorrow.  3.  Secondary hyperparathyroidism.  Phosphorus 3.6 and acceptable.  Continue to monitor bone mineral metabolism parameters.  Continue Renvela 1600 mg by mouth 3 times a day.  4.  Shortness of breath due to pulmonary edema and possibly pneumonia.  The patient was prescribed Levaquin as an outpatient.  He has now improved with volume removal as well as antibiotic therapy.  Continue antibiotic therapy  untildischarge.  We will attempt further volume removal with dialysis tomorrow.   LOS: 1 Hadyn Azer 12/2/20165:15 PM

## 2015-10-11 NOTE — Progress Notes (Signed)
Initial appointment made at the [facility] on October 31, 2015 at 10:00am. Thank you.

## 2015-10-11 NOTE — Progress Notes (Signed)
Initial Nutrition Assessment     INTERVENTION:  Meals and snacks: Monitor intake Nutrition diet education: Pt familiar with diet restrictions.  Reports he speak with RD at dialysis center   NUTRITION DIAGNOSIS:    (none at this time) related to   as evidenced by  .    GOAL:   Patient will meet greater than or equal to 90% of their needs    MONITOR:    (Energy intake, Electrolyte and renal profile)  REASON FOR ASSESSMENT:    (renal diet)    ASSESSMENT:      Pt admitted with shortness of breath, CHF exacerbation, pneumonia  Past Medical History  Diagnosis Date  . End stage renal disease on dialysis (HCC)     LUE fistula  . Type I diabetes mellitus (HCC)     a. 03/2014 admitted with HNK to [facility]. b. TTS  . Diabetic neuropathy (HCC)     severe, s/p multiple toe amputation  . Hypothyroidism   . Hypertension   . Hyperlipidemia   . H/O hiatal hernia   . GERD (gastroesophageal reflux disease)   . Anxiety   . Sebaceous cyst     side of neck  . Pneumonia     2010  . Anemia     a. req PRBC's 2011.  [name] PAD (peripheral artery disease) (HCC)     a. s/p amputation of toes on the right;  b. left LE claudication.  . Coronary artery disease     a. s/p MI;  b. 10/2009 CABG x 3 @ [facility]: LIMA->LAD, VG->OM3, VG->RPDA; c. 11/2010 Cath 3/3 patent grafts;  d 12/2012 Cath: LM 30d, LAD 85p, D1 70, D2 90, LCX 40ost, OM2 100, RCA 90p, 14m, L->LAD ok, VG->OM3 ok, VG->RPDA 30, EF 50%->Med Rx.  . Cataract     right  . Valvular disease     a. 11/2012 Echo: EF 55-60%, mild LVH, mild MR, mild bi-atrial enlargement, mild-mod TR, PASP ; b. echo 03/2014: EF 50-55%, nl WM, select images concerning for bicuspid aortic valve w/ nl thicknes of leaflets, mild MR, mild bi-atrial dilatation, RV mild dilatation - wall thickness nl, mod TR - select images appears to be mod to sev, PASP at least mod elevated.  . Diastolic CHF (HCC)     see echo above  . Depression   . Arthritis     rheumatoid  arthritis   . Myocardial infarction (HCC) 2010  . Pulmonary HTN (HCC)     a. continuation of 03/2014 echo PASP @ least mod elevated. RVSP 52 mm Hg. Parasternal long axis estimated @ 85 mm Hg  . Scrotal abscess     a. s/p multiple I&D  . Penile abscess     a. s/p multiple I&D  . Hematemesis     a. EGD 2016 with LA Grade C esophagitis, continued on Protonix   . Chronic respiratory failure (HCC)     a. on 3L oxygen via nasal cannula  . Anxiety   . Acute delirium   . Sepsis (HCC)   . 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 MYOCARDIAL INFARCTION, HX OF 01/26/2011    Qualifier: Diagnosis of  By: [name] MD, [name]    . HTN (hypertension) 02/11/2011  . Pulmonary hypertension associated with end stage renal disease on dialysis (HCC) 04/16/2014  . Hyperkalemia   . Hypothyroidism   . Osteomyelitis (HCC)     Current Nutrition: tolerating meals well per pt, good appetite   Food/Nutrition-Related History: pt reports good  appetite prior to admission   Scheduled Medications:  . amitriptyline  25 mg Oral QHS  . antiseptic oral rinse  7 mL Mouth Rinse BID  . aspirin  325 mg Oral Daily  . carvedilol  25 mg Oral BID WC  . cinacalcet  30 mg Oral Q breakfast  . cloNIDine  0.1 mg Oral BID  . clopidogrel  75 mg Oral Daily  . furosemide  80 mg Oral BID  . heparin subcutaneous  5,000 Units Subcutaneous 3 times per day  . insulin aspart  0-5 Units Subcutaneous QHS  . insulin aspart  0-9 Units Subcutaneous TID WC  . insulin detemir  20 Units Subcutaneous QHS  . isosorbide dinitrate  30 mg Oral Daily  . levothyroxine  75 mcg Oral QAC breakfast  . omega-3 acid ethyl esters  1 g Oral Daily  . pantoprazole  40 mg Oral Daily  . piperacillin-tazobactam (ZOSYN)  IV  3.375 g Intravenous Q12H  . pregabalin  75 mg Oral BID  . rosuvastatin  20 mg Oral QHS  . sevelamer carbonate  1,600 mg Oral TID WC        Electrolyte/Renal Profile and Glucose Profile:   Recent Labs Lab 10/10/15 1418 10/11/15 0358  10/11/15 1014  NA 134* 134*  --   K 3.6 3.5  --   CL 92* 94*  --   CO2 31 30  --   BUN 19 25*  --   CREATININE 2.41* 2.78*  --   CALCIUM 8.4* 8.7*  --   PHOS  --   --  3.6  GLUCOSE 309* 272*  --     Gastrointestinal Profile: Last BM: 12/02    Weight Change: stable wt per pt    Diet Order:  Diet renal with fluid restriction Fluid restriction:: 1200 mL Fluid; Room service appropriate?: Yes; Fluid consistency:: Thin  Skin:   pressure ulcer stage I noted    Height:   Ht Readings from Last 1 Encounters:  10/10/15 6\' 3"  (1.905 m)    Weight:   Wt Readings from Last 1 Encounters:  10/11/15 174 lb 6.1 oz (79.1 kg)    Ideal Body Weight:     BMI:  Body mass index is 21.8 kg/(m^2).   EDUCATION NEEDS:   No education needs identified at this time  LOW Care Level  Mateja Dier B. 14/02/16, RD, LDN [phone] (pager)

## 2015-10-11 NOTE — Progress Notes (Signed)
Patient alert and oriented x4, no complaints at this time. vss at this time. Patient NSR on telemetry. Will continue to assess. Chapman Matteucci R [place]  

## 2015-10-11 NOTE — Progress Notes (Signed)
Inpatient Diabetes Program Recommendations  AACE/ADA: New Consensus Statement on Inpatient Glycemic Control (2015)  Target Ranges:  Prepandial:   less than 140 mg/dL      Peak postprandial:   less than 180 mg/dL (1-2 hours)      Critically ill patients:  140 - 180 mg/dL   Review of Glycemic Control  Diabetes history: Type 1 Outpatient Diabetes medications: Lantus 14 units qhs, Novolog 7 or more units tid with meals (does not take insulin at snacks) Current orders for Inpatient glycemic control: Levemir 14 units qhs, Novolog 0-9 units tid with meals, Novolog 0-5 units qhs  Inpatient Diabetes Program Recommendations: Consider adding mealtime insulin Novolog 3 units tid and continue Novolog correction as ordered.  Patient has been taking Lantus insulin at home- consider switching Levemir to Lantus while inpatient.   Patient reports that he would like to try an insulin pump to see if he could better manage his diabetes.  I have informed him that he would have to see his endocrinologist on a regular basis and be checking a blood sugar a minimum of 4 times a day for most insurance companies to approve a pump.  I have also encouraged the patient to take advantage of [facility] nursing services to better manage his care. Consult to [facility] completed.   Susette Racer, RN, BA, MHA, CDE Diabetes Coordinator Inpatient Diabetes Program  [phone] (Team Pager) [phone] [facility] Office) 10/11/2015 10:06 AM

## 2015-10-12 LAB — C DIFFICILE QUICK SCREEN W PCR REFLEX
C DIFFICILE (CDIFF) TOXIN: NEGATIVE
C DIFFICLE (CDIFF) ANTIGEN: POSITIVE — AB

## 2015-10-12 LAB — GLUCOSE, CAPILLARY
GLUCOSE-CAPILLARY: 113 mg/dL — AB (ref 65–99)
GLUCOSE-CAPILLARY: 213 mg/dL — AB (ref 65–99)
Glucose-Capillary: 182 mg/dL — ABNORMAL HIGH (ref 65–99)
Glucose-Capillary: 197 mg/dL — ABNORMAL HIGH (ref 65–99)

## 2015-10-12 LAB — PARATHYROID HORMONE, INTACT (NO CA): PTH: 107 pg/mL — ABNORMAL HIGH (ref 15–65)

## 2015-10-12 LAB — CLOSTRIDIUM DIFFICILE BY PCR: CDIFFPCR: POSITIVE — AB

## 2015-10-12 MED ORDER — METRONIDAZOLE 500 MG PO TABS
500.0000 mg | ORAL_TABLET | Freq: Three times a day (TID) | ORAL | Status: DC
  Administered 2015-10-12 – 2015-10-17 (×14): 500 mg via ORAL
  Filled 2015-10-12 (×14): qty 1

## 2015-10-12 MED ORDER — EPOETIN ALFA 10000 UNIT/ML IJ SOLN
10000.0000 [IU] | INTRAMUSCULAR | Status: DC
  Administered 2015-10-15 – 2015-10-17 (×2): 10000 [IU] via INTRAVENOUS

## 2015-10-12 MED ORDER — CEPHALEXIN 500 MG PO CAPS
500.0000 mg | ORAL_CAPSULE | Freq: Two times a day (BID) | ORAL | Status: DC
  Administered 2015-10-12 (×2): 500 mg via ORAL
  Filled 2015-10-12 (×3): qty 1

## 2015-10-12 NOTE — Progress Notes (Signed)
Patient ID: Taylor [name], male   DOB: Apr 09, 1971, 44 y.o.   MRN: [id number] [facility] - [place] at [facility]   PATIENT NAME: Taylor [name]    MR#:  [id number]  DATE OF BIRTH:  [birth year]  SUBJECTIVE:   Doing well no complaints coughing up clear phlegm no fever eating well REVIEW OF SYSTEMS:   Review of Systems  Constitutional: Negative for fever, chills and weight loss.  HENT: Negative for ear discharge, ear pain and nosebleeds.   Eyes: Negative for blurred vision, pain and discharge.  Respiratory: Negative for sputum production, shortness of breath, wheezing and stridor.   Cardiovascular: Negative for chest pain, palpitations, orthopnea and PND.  Gastrointestinal: Negative for nausea, vomiting, abdominal pain and diarrhea.  Genitourinary: Negative for urgency and frequency.  Musculoskeletal: Negative for back pain and joint pain.  Neurological: Negative for sensory change, speech change, focal weakness and weakness.  Psychiatric/Behavioral: Negative for depression and hallucinations. The patient is not nervous/anxious.   All other systems reviewed and are negative.  Tolerating Diet: Yes Tolerating PT: Yes  DRUG ALLERGIES:   Allergies  Allergen Reactions  . Amoxicillin-Pot Clavulanate Nausea And Vomiting and Itching  . Rifampin Nausea And Vomiting  . Tape Other (See Comments)    VITALS:  Blood pressure 131/64, pulse 78, temperature 97.6 F (36.4 C), temperature source Oral, resp. rate 18, height 6\' 3"  (1.905 m), weight 79.1 kg (174 lb 6.1 oz), SpO2 100 %.  PHYSICAL EXAMINATION:   Physical Exam  GENERAL:  44 y.o.-year-old patient lying in the bed with no acute distress.  EYES: Pupils equal, round, reactive to light and accommodation. No scleral icterus. Extraocular muscles intact.  HEENT: Head atraumatic, normocephalic. Oropharynx and nasopharynx clear.  NECK:  Supple, no jugular venous distention. No thyroid enlargement, no  tenderness.  LUNGS: Normal breath sounds bilaterally, no wheezing, rales, rhonchi. No use of accessory muscles of respiration.  CARDIOVASCULAR: S1, S2 normal. No murmurs, rubs, or gallops.  ABDOMEN: Soft, nontender, nondistended. Bowel sounds present. No organomegaly or mass.  EXTREMITIES: No cyanosis, clubbing or edema b/l.    NEUROLOGIC: Cranial nerves II through XII are intact. No focal Motor or sensory deficits b/l.   PSYCHIATRIC: The patient is alert and oriented x 3.  SKIN: No obvious rash, lesion, or ulcer.    LABORATORY PANEL:   CBC  Recent Labs Lab 10/11/15 0358  WBC 5.8  HGB 9.4*  HCT 30.2*  PLT 111*    Chemistries   Recent Labs Lab 10/11/15 0358  NA 134*  K 3.5  CL 94*  CO2 30  GLUCOSE 272*  BUN 25*  CREATININE 2.78*  CALCIUM 8.7*    Cardiac Enzymes No results for input(s): TROPONINI in the last 168 hours.  RADIOLOGY:  No results found.   ASSESSMENT AND PLAN:  Ahmere Hemenway is a 44 y.o. male with a known history of htn, diabetes mellitus type 2, ESRD on hemodialysis comes in because of shortness of breath. Patient finished hemodialysis today, after that the noted to have shortness of breath   #1 shortness of breath today suspected due to congestive heart failure diastolic acute on chronic -Patient underwent dialysis today with ultrafiltration feels a lot better. His sats are 100% on 3 L nasal cannula. -He is on chronic home oxygen.  -x-ray congestive heart failure,no findings of pneumonia. -Narrow down antibiotics to by mouth Keflex. All his symptoms are likely due to CHF. No active infection identified. Patient has urine that's positive for WBC. He  denies any dysuria. Keflex should cover. -Sats 100% on 3 L nasal cannula oxygen.  #2 and the general disease on hemodialysis with ultrafiltration Sats stable 100% on 3 L.  #3 diabetes mellitus type 2: Continue NovoLog with meal coverage,Levemir  #4 hypothyroidism continue Synthroid 75 mg by  mouth daily  #5 hypertension controlled continue clonidine, Coreg, Isordil, Lasix.  #6 history of left carotid stenosis: Recently started on Plavix 3 weeks ago. Continue that.  Discussed with patient that he is medically stable to be discharged after hemodialysis. I will patient refuses and would like to go home tomorrow. Case discussed with Care Management/Social Worker. Management plans discussed with the patient,and they are in agreement.  CODE STATUS: Full DVT Prophylaxis: heparin  TOTAL TIME TAKING CARE OF THIS PATIENT:  30 minutes.  >50% time spent on counselling and coordination of care  POSSIBLE D/C IN1-2 DAYS, DEPENDING ON CLINICAL CONDITION.   Akram Kissick M.D on 10/12/2015 at 1:18 PM  Between 7am to 6pm - Pager - [phone]  After 6pm go to [email or web address] - password [email or web address] [facility]  [place] [facility]  Office  [phone]  CC: Primary care physician; [name], MD

## 2015-10-12 NOTE — Progress Notes (Signed)
[facility]  ROUNDING NOTE   Subjective:  Patient due for dialysis again today. He has had dialysis for the past 2 days. Weight is significantly down as compared to admission.   Objective:  Vital signs in last 24 hours:  Temp:  [97.6 F (36.4 C)-98.2 F (36.8 C)] 97.6 F (36.4 C) (12/03 1100) Pulse Rate:  [72-78] 78 (12/03 1100) Resp:  [16-18] 18 (12/03 1100) BP: (127-154)/(62-75) 131/64 mmHg (12/03 1100) SpO2:  [100 %] 100 % (12/03 1100)  Weight change: -7.083 kg (-15 lb 9.9 oz) Filed Weights   10/10/15 1109 10/10/15 1803 10/11/15 1010  Weight: 86.183 kg (190 lb) 76.386 kg (168 lb 6.4 oz) 79.1 kg (174 lb 6.1 oz)    Intake/Output: I/O last 3 completed shifts: In: 780 [P.O.:480; IV Piggyback:300] Out: 1600 [Urine:550; Other:1050]   Intake/Output this shift:  Total I/O In: 480 [P.O.:480] Out: 100 [Urine:100]  Physical Exam: General: NAD, laying bed  Head: Normocephalic, atraumatic. Moist oral mucosal membranes  Eyes: Anicteric  Neck: Supple, trachea midline  Lungs:  Basilar rales, normal effort   Heart: Regular rate and rhythm  Abdomen:  Soft, nontender, BS present   Extremities: 1+ peripheral edema.  Neurologic: Nonfocal, moving all four extremities  Skin: No lesions  Access: LUE AVF    Basic Metabolic Panel:  Recent Labs Lab 10/10/15 1418 10/11/15 0358 10/11/15 1014  NA 134* 134*  --   K 3.6 3.5  --   CL 92* 94*  --   CO2 31 30  --   GLUCOSE 309* 272*  --   BUN 19 25*  --   CREATININE 2.41* 2.78*  --   CALCIUM 8.4* 8.7*  --   PHOS  --   --  3.6    Liver Function Tests: No results for input(s): AST, ALT, ALKPHOS, BILITOT, PROT, ALBUMIN in the last 168 hours. No results for input(s): LIPASE, AMYLASE in the last 168 hours. No results for input(s): AMMONIA in the last 168 hours.  CBC:  Recent Labs Lab 10/10/15 1418 10/11/15 0358  WBC 8.2 5.8  NEUTROABS 6.9*  --   HGB 10.0* 9.4*  HCT 31.9* 30.2*  MCV 91.0 91.4  PLT 106* 111*     Cardiac Enzymes: No results for input(s): CKTOTAL, CKMB, CKMBINDEX, TROPONINI in the last 168 hours.  BNP: Invalid input(s): POCBNP  CBG:  Recent Labs Lab 10/11/15 1635 10/11/15 2056 10/11/15 2213 10/12/15 0729 10/12/15 1111  GLUCAP 84 179* 190* 113* 182*    Microbiology: Results for orders placed or performed during the hospital encounter of 10/10/15  MRSA PCR Screening     Status: None   Collection Time: 10/10/15  6:02 PM  Result Value Ref Range Status   MRSA by PCR NEGATIVE NEGATIVE Final    Comment:        The GeneXpert MRSA Assay (FDA approved for NASAL specimens only), is one component of a comprehensive MRSA colonization surveillance program. It is not intended to diagnose MRSA infection nor to guide or monitor treatment for MRSA infections.     Coagulation Studies: No results for input(s): LABPROT, INR in the last 72 hours.  Urinalysis:  Recent Labs  10/10/15 1500  COLORURINE YELLOW*  LABSPEC 1.006  PHURINE 7.0  GLUCOSEU >500*  HGBUR 3+*  BILIRUBINUR NEGATIVE  KETONESUR NEGATIVE  PROTEINUR 100*  NITRITE NEGATIVE  LEUKOCYTESUR 3+*      Imaging: No results found.   Medications:     . amitriptyline  25 mg Oral QHS  .  antiseptic oral rinse  7 mL Mouth Rinse BID  . aspirin  325 mg Oral Daily  . carvedilol  25 mg Oral BID WC  . cinacalcet  30 mg Oral Q breakfast  . cloNIDine  0.1 mg Oral BID  . clopidogrel  75 mg Oral Daily  . furosemide  80 mg Oral BID  . heparin subcutaneous  5,000 Units Subcutaneous 3 times per day  . insulin aspart  0-5 Units Subcutaneous QHS  . insulin aspart  0-9 Units Subcutaneous TID WC  . insulin detemir  20 Units Subcutaneous QHS  . isosorbide dinitrate  30 mg Oral Daily  . levothyroxine  75 mcg Oral QAC breakfast  . omega-3 acid ethyl esters  1 g Oral Daily  . pantoprazole  40 mg Oral Daily  . piperacillin-tazobactam (ZOSYN)  IV  3.375 g Intravenous Q12H  . pregabalin  75 mg Oral BID  . rosuvastatin   20 mg Oral QHS  . sevelamer carbonate  1,600 mg Oral TID WC   sodium chloride, sodium chloride, acetaminophen, alteplase, alum & mag hydroxide-simeth, heparin, lidocaine (PF), lidocaine-prilocaine, nitroGLYCERIN, ondansetron, pentafluoroprop-tetrafluoroeth, promethazine, vancomycin  Assessment/ Plan:  44 y.o. 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  End-stage renal disease on hemodialysis Tuesday, Thursday, Saturday.  The patient has completed 2 consecutive days of dialysis. He is due for dialysis again today. We will plan for ultrafiltration target of 1.5-2 kg.  2.  Anemia chronic kidney disease.  Start epogen 10000 units IV with HD today.  3.  Secondary hyperparathyroidism.   Continue Renvela 1600 mg by mouth 3 times a day.  4.  Shortness of breath due to pulmonary edema and possibly pneumonia.  Weight is significantly down as compared to admission. We will plan for additional ultrafiltration today. He has been maintained on anabolic therapy as well.   LOS: 2 [name] 12/3/201612:54 PM

## 2015-10-13 LAB — RENAL FUNCTION PANEL
ANION GAP: 10 (ref 5–15)
Albumin: 2.4 g/dL — ABNORMAL LOW (ref 3.5–5.0)
BUN: 43 mg/dL — ABNORMAL HIGH (ref 6–20)
CHLORIDE: 90 mmol/L — AB (ref 101–111)
CO2: 27 mmol/L (ref 22–32)
Calcium: 8.1 mg/dL — ABNORMAL LOW (ref 8.9–10.3)
Creatinine, Ser: 3.53 mg/dL — ABNORMAL HIGH (ref 0.61–1.24)
GFR calc non Af Amer: 19 mL/min — ABNORMAL LOW (ref >60)
GFR, EST AFRICAN AMERICAN: 22 mL/min — AB (ref >60)
GLUCOSE: 155 mg/dL — AB (ref 65–99)
POTASSIUM: 5.9 mmol/L — AB (ref 3.5–5.1)
Phosphorus: 4 mg/dL (ref 2.5–4.6)
SODIUM: 127 mmol/L — AB (ref 135–145)

## 2015-10-13 LAB — CBC
HEMATOCRIT: 29.7 % — AB (ref 40.0–52.0)
HEMOGLOBIN: 9.3 g/dL — AB (ref 13.0–18.0)
MCH: 28.6 pg (ref 26.0–34.0)
MCHC: 31.1 g/dL — AB (ref 32.0–36.0)
MCV: 91.8 fL (ref 80.0–100.0)
Platelets: 117 10*3/uL — ABNORMAL LOW (ref 150–440)
RBC: 3.24 MIL/uL — AB (ref 4.40–5.90)
RDW: 16 % — ABNORMAL HIGH (ref 11.5–14.5)
WBC: 7 10*3/uL (ref 3.8–10.6)

## 2015-10-13 LAB — GLUCOSE, CAPILLARY
GLUCOSE-CAPILLARY: 106 mg/dL — AB (ref 65–99)
GLUCOSE-CAPILLARY: 60 mg/dL — AB (ref 65–99)
GLUCOSE-CAPILLARY: 120 mg/dL — AB (ref 65–99)
Glucose-Capillary: 96 mg/dL (ref 65–99)
Glucose-Capillary: 240 mg/dL — ABNORMAL HIGH (ref 65–99)

## 2015-10-13 MED ORDER — BENZONATATE 100 MG PO CAPS
100.0000 mg | ORAL_CAPSULE | Freq: Three times a day (TID) | ORAL | Status: DC | PRN
  Administered 2015-10-13 – 2015-10-15 (×4): 100 mg via ORAL
  Filled 2015-10-13 (×4): qty 1

## 2015-10-13 MED ORDER — METRONIDAZOLE 500 MG PO TABS: 500.0000 mg | ORAL_TABLET | Freq: Three times a day (TID) | ORAL | Status: DC

## 2015-10-13 NOTE — Progress Notes (Signed)
Pt reports to RN he would not like to be discharge because of his C diff. So far no loose BM and he is already started on po flagyl. Pt also had decline his HD y'day (reasoneunknow). He is getting HD today. Vitals stable Pt medically is currently stable for discharge to home today after HD. Informed CM Dr Cherylann Ratel aware.

## 2015-10-13 NOTE — Progress Notes (Signed)
[facility]  ROUNDING NOTE   Subjective:  Patient ended up declining dialysis yesterday. He is due for dialysis therefore again today since he missed yesterday. Interval weight gain noted.   Objective:  Vital signs in last 24 hours:  Temp:  [97.5 F (36.4 C)-97.8 F (36.6 C)] 97.8 F (36.6 C) (12/04 1430) Pulse Rate:  [74-80] 79 (12/04 1500) Resp:  [16-25] 20 (12/04 1500) BP: (129-163)/(62-75) 150/69 mmHg (12/04 1500) SpO2:  [100 %] 100 % (12/04 1100) Weight:  [81.3 kg (179 lb 3.7 oz)] 81.3 kg (179 lb 3.7 oz) (12/04 1430)  Weight change:  Filed Weights   10/10/15 1803 10/11/15 1010 10/13/15 1430  Weight: 76.386 kg (168 lb 6.4 oz) 79.1 kg (174 lb 6.1 oz) 81.3 kg (179 lb 3.7 oz)    Intake/Output: I/O last 3 completed shifts: In: 720 [P.O.:720] Out: 1050 [Urine:1050]   Intake/Output this shift:  Total I/O In: -  Out: 300 [Urine:300]  Physical Exam: General: NAD, laying bed  Head: Normocephalic, atraumatic. Moist oral mucosal membranes  Eyes: Anicteric  Neck: Supple, trachea midline  Lungs:  Basilar rales, normal effort   Heart: Regular rate and rhythm  Abdomen:  Soft, nontender, BS present   Extremities: 1+ peripheral edema.  Neurologic: Nonfocal, moving all four extremities  Skin: No lesions  Access: LUE AVF    Basic Metabolic Panel:  Recent Labs Lab 10/10/15 1418 10/11/15 0358 10/11/15 1014 10/13/15 1347  NA 134* 134*  --  127*  K 3.6 3.5  --  5.9*  CL 92* 94*  --  90*  CO2 31 30  --  27  GLUCOSE 309* 272*  --  155*  BUN 19 25*  --  43*  CREATININE 2.41* 2.78*  --  3.53*  CALCIUM 8.4* 8.7*  --  8.1*  PHOS  --   --  3.6 4.0    Liver Function Tests:  Recent Labs Lab 10/13/15 1347  ALBUMIN 2.4*   No results for input(s): LIPASE, AMYLASE in the last 168 hours. No results for input(s): AMMONIA in the last 168 hours.  CBC:  Recent Labs Lab 10/10/15 1418 10/11/15 0358 10/13/15 1346  WBC 8.2 5.8 7.0  NEUTROABS 6.9*  --   --    HGB 10.0* 9.4* 9.3*  HCT 31.9* 30.2* 29.7*  MCV 91.0 91.4 91.8  PLT 106* 111* 117*    Cardiac Enzymes: No results for input(s): CKTOTAL, CKMB, CKMBINDEX, TROPONINI in the last 168 hours.  BNP: Invalid input(s): POCBNP  CBG:  Recent Labs Lab 10/12/15 1720 10/12/15 2149 10/13/15 0743 10/13/15 0821 10/13/15 1132  GLUCAP 213* 197* 60* 106* 96    Microbiology: Results for orders placed or performed during the hospital encounter of 10/10/15  MRSA PCR Screening     Status: None   Collection Time: 10/10/15  6:02 PM  Result Value Ref Range Status   MRSA by PCR NEGATIVE NEGATIVE Final    Comment:        The GeneXpert MRSA Assay (FDA approved for NASAL specimens only), is one component of a comprehensive MRSA colonization surveillance program. It is not intended to diagnose MRSA infection nor to guide or monitor treatment for MRSA infections.   C difficile quick scan w PCR reflex     Status: Abnormal   Collection Time: 10/12/15 12:50 PM  Result Value Ref Range Status   C Diff antigen POSITIVE (A) NEGATIVE Final   C Diff toxin NEGATIVE NEGATIVE Final   C Diff interpretation  Final    Positive for toxigenic C. difficile, active toxin production not detected. Patient has toxigenic C. difficile organisms present in the bowel, but toxin was not detected. The patient may be a carrier or the level of toxin in the sample was below the limit  of detection. This information should be used in conjunction with the patient's clinical history when deciding on possible therapy.   Clostridium Difficile by PCR     Status: Abnormal   Collection Time: 10/12/15 12:50 PM  Result Value Ref Range Status   Toxigenic C Difficile by pcr POSITIVE (A) NEGATIVE Final    Comment: CRITICAL RESULT CALLED TO, READ BACK BY AND VERIFIED WITH: Dorita Fray, RN 10/12/2015 1454 BY JRS.     Coagulation Studies: No results for input(s): LABPROT, INR in the last 72 hours.  Urinalysis: No results for  input(s): COLORURINE, LABSPEC, PHURINE, GLUCOSEU, HGBUR, BILIRUBINUR, KETONESUR, PROTEINUR, UROBILINOGEN, NITRITE, LEUKOCYTESUR in the last 72 hours.  Invalid input(s): APPERANCEUR    Imaging: No results found.   Medications:     . amitriptyline  25 mg Oral QHS  . antiseptic oral rinse  7 mL Mouth Rinse BID  . aspirin  325 mg Oral Daily  . carvedilol  25 mg Oral BID WC  . cinacalcet  30 mg Oral Q breakfast  . cloNIDine  0.1 mg Oral BID  . clopidogrel  75 mg Oral Daily  . [START ON 10/15/2015] epoetin (EPOGEN/PROCRIT) injection  10,000 Units Intravenous Q T,Th,Sa-HD  . furosemide  80 mg Oral BID  . heparin subcutaneous  5,000 Units Subcutaneous 3 times per day  . insulin aspart  0-5 Units Subcutaneous QHS  . insulin aspart  0-9 Units Subcutaneous TID WC  . insulin detemir  20 Units Subcutaneous QHS  . isosorbide dinitrate  30 mg Oral Daily  . levothyroxine  75 mcg Oral QAC breakfast  . metroNIDAZOLE  500 mg Oral 3 times per day  . omega-3 acid ethyl esters  1 g Oral Daily  . pantoprazole  40 mg Oral Daily  . pregabalin  75 mg Oral BID  . rosuvastatin  20 mg Oral QHS  . sevelamer carbonate  1,600 mg Oral TID WC   sodium chloride, sodium chloride, acetaminophen, alteplase, alum & mag hydroxide-simeth, benzonatate, heparin, lidocaine (PF), lidocaine-prilocaine, nitroGLYCERIN, ondansetron, pentafluoroprop-tetrafluoroeth, promethazine  Assessment/ Plan:  44 y.o. 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  End-stage renal disease on hemodialysis Tuesday, Thursday, Saturday.  patient declined dialysis yesterday.  Therefore he is due for dialysis today.  Ultrafiltration target 1.5-2 kg.  2.   Anemia chronic kidney disease.  Pt will continue Epogen with dialysis.  3.  Secondary hyperparathyroidism.   Continue Renvela 1600 mg by mouth 3 times a day.  Phosphorus 3.6 and PTH 107.  4.  Shortness of breath due to pulmonary edema and possibly pneumonia.  Weight continues to fluctuate.  Continue ultrafiltration with dialysis.  5.  C. Difficile colitis.  The patient has been started on metronidazole.   LOS: 3 Tamana Hatfield 12/4/20163:15 PM

## 2015-10-13 NOTE — Progress Notes (Signed)
Patient refused to go down for dialysis. Patient stated he is having diarrhea. Dr. Nunzio Cobbs discussed going down for dialysis prior to discharge. Per patient he does not want to be discharged due to having C. Diff. Patient requested to speak with Child psychotherapist. Notified Dr. Allena Katz of patient's concerns.

## 2015-10-13 NOTE — Progress Notes (Signed)
Patient, stated he feels like his blood sugar is low. Per Glucometer CBG was 60. Administered 2 cups of orange juice. Rechecked CBG, CBG was 106. Patient was feeling nausea, gave PRN phenergan. Will continue to monitor patient. Rudean Haskell

## 2015-10-13 NOTE — Care Management Note (Signed)
Case Management Note  Patient Details  Name: Taylor [name] MRN: [id number] Date of Birth: [birth year]  Subjective/Objective:    Discussed discharge with Dr Enedina Finner who reports that [name] told her that he plans to appeal to Medicare his discharge home today. New Diagnosis of C-Diff today. Updated [name] at [facility] case management to please call the case manager (this Clinical research associate) with the appeal case number so that all the required information can be faxed to Medicare.                  Action/Plan:   Expected Discharge Date:                  Expected Discharge Plan:     In-House Referral:     Discharge planning Services     Post Acute Care Choice:    Choice offered to:     DME Arranged:    DME Agency:     HH Arranged:    HH Agency:     Status of Service:     Medicare Important Message Given:    Date Medicare IM Given:    Medicare IM give by:    Date Additional Medicare IM Given:    Additional Medicare Important Message give by:     If discussed at Long Length of Stay Meetings, dates discussed:    Additional Comments:  [name] A, RN 10/13/2015, 12:29 PM

## 2015-10-13 NOTE — Discharge Instructions (Signed)
[facility] appointment on October 31, 2015 at 10:00am with Clarisa Kindred, FNP. Please call [phone] to reschedule.   Resume your HD as before

## 2015-10-13 NOTE — Discharge Summary (Signed)
[facility] - [facility]   PATIENT NAME: Taylor [name]    MR#:  [id number]  DATE OF BIRTH:  [birth year]  DATE OF ADMISSION:  10/10/2015 ADMITTING PHYSICIAN: Katha Hamming, MD  DATE OF DISCHARGE: 10/13/15  PRIMARY CARE PHYSICIAN: [name], MD    ADMISSION DIAGNOSIS:  Shortness of breath [R06.02] Acute pulmonary edema (HCC) [J81.0]  DISCHARGE DIAGNOSIS:  Acute on chronic CHF, diastolic s/p HD with fluid removal C diff colitis ESRD on HD  SECONDARY DIAGNOSIS:   Past Medical History  Diagnosis Date  . End stage renal disease on dialysis (HCC)     LUE fistula  . Type I diabetes mellitus (HCC)     a. 03/2014 admitted with HNK to [facility]. b. TTS  . Diabetic neuropathy (HCC)     severe, s/p multiple toe amputation  . Hypothyroidism   . Hypertension   . Hyperlipidemia   . H/O hiatal hernia   . GERD (gastroesophageal reflux disease)   . Anxiety   . Sebaceous cyst     side of neck  . Pneumonia     2010  . Anemia     a. req PRBC's 2011.  [name] PAD (peripheral artery disease) (HCC)     a. s/p amputation of toes on the right;  b. left LE claudication.  . Coronary artery disease     a. s/p MI;  b. 10/2009 CABG x 3 @ [facility]: LIMA->LAD, VG->OM3, VG->RPDA; c. 11/2010 Cath 3/3 patent grafts;  d 12/2012 Cath: LM 30d, LAD 85p, D1 70, D2 90, LCX 40ost, OM2 100, RCA 90p, 18m, L->LAD ok, VG->OM3 ok, VG->RPDA 30, EF 50%->Med Rx.  . Cataract     right  . Valvular disease     a. 11/2012 Echo: EF 55-60%, mild LVH, mild MR, mild bi-atrial enlargement, mild-mod TR, PASP ; b. echo 03/2014: EF 50-55%, nl WM, select images concerning for bicuspid aortic valve w/ nl thicknes of leaflets, mild MR, mild bi-atrial dilatation, RV mild dilatation - wall thickness nl, mod TR - select images appears to be mod to sev, PASP at least mod elevated.  . Diastolic CHF (HCC)     see echo above  . Depression   . Arthritis     rheumatoid arthritis   . Myocardial  infarction (HCC) 2010  . Pulmonary HTN (HCC)     a. continuation of 03/2014 echo PASP @ least mod elevated. RVSP 52 mm Hg. Parasternal long axis estimated @ 85 mm Hg  . Scrotal abscess     a. s/p multiple I&D  . Penile abscess     a. s/p multiple I&D  . Hematemesis     a. EGD 2016 with LA Grade C esophagitis, continued on Protonix   . Chronic respiratory failure (HCC)     a. on 3L oxygen via nasal cannula  . Anxiety   . Acute delirium   . Sepsis (HCC)   . 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 MYOCARDIAL INFARCTION, HX OF 01/26/2011    Qualifier: Diagnosis of  By: [name] MD, [name]    . HTN (hypertension) 02/11/2011  . Pulmonary hypertension associated with end stage renal disease on dialysis (HCC) 04/16/2014  . Hyperkalemia   . Hypothyroidism   . Osteomyelitis [facility])     HOSPITAL COURSE:  Viraj Liby is a 44 y.o. male with a known history of htn, diabetes mellitus type 2, ESRD on hemodialysis comes in because of shortness of breath. Patient finished hemodialysis today, after that  the noted to have shortness of breath   #1 shortness of breath today suspected due to congestive heart failure diastolic acute on chronic -Patient underwent dialysis today with ultrafiltration feels a lot better. His sats are 100% on 3 L nasal cannula. -He is on chronic home oxygen. -x-ray congestive heart failure,no findings of pneumonia. D/c po abxs -Sats 100% on 3 L nasal cannula oxygen.  #2 ESRD on hemodialysis  Sats stable 100% on 3 L.  #3 diabetes mellitus type 2: Continue NovoLog with meal coverage,Levemir  #4 hypothyroidism continue Synthroid 75 mg by mouth daily  #5 hypertension controlled continue clonidine, Coreg, Isordil, Lasix.  #6 history of left carotid stenosis: Recently started on Plavix 3 weeks ago. Continue that.  #7 C difficile colitis Po flagyl  CONSULTS OBTAINED:    Munsoor Lateef, MD  DRUG ALLERGIES:   Allergies  Allergen Reactions  . Amoxicillin-Pot Clavulanate  Nausea And Vomiting and Itching  . Rifampin Nausea And Vomiting  . Tape Other (See Comments)    DISCHARGE MEDICATIONS:   Current Discharge Medication List    START taking these medications   Details  metroNIDAZOLE (FLAGYL) 500 MG tablet Take 1 tablet (500 mg total) by mouth every 8 (eight) hours. Qty: 30 tablet, Refills: 0      CONTINUE these medications which have NOT CHANGED   Details  acetaminophen (TYLENOL) 500 MG tablet Take 1,000 mg by mouth every 6 (six) hours as needed for mild pain or headache.     alum & mag hydroxide-simeth (MAALOX PLUS) 400-400-40 MG/5ML suspension Take 30 mLs by mouth every 4 (four) hours as needed for indigestion.     amitriptyline (ELAVIL) 25 MG tablet Take 1 tablet (25 mg total) by mouth at bedtime. Qty: 30 tablet, Refills: 1    aspirin 325 MG EC tablet Take 325 mg by mouth daily.    carvedilol (COREG) 25 MG tablet Take 1 tablet (25 mg total) by mouth 2 (two) times daily with a meal. Qty: 180 tablet, Refills: 1    cinacalcet (SENSIPAR) 30 MG tablet Take 30 mg by mouth daily.    cloNIDine (CATAPRES) 0.1 MG tablet Take 0.1 mg by mouth 2 (two) times daily.    clopidogrel (PLAVIX) 75 MG tablet Take 1 tablet (75 mg total) by mouth daily. Qty: 90 tablet, Refills: 3    DHA-EPA-VITAMIN E PO Take 1 capsule by mouth daily.    furosemide (LASIX) 80 MG tablet Take 80 mg by mouth 2 (two) times daily.    !! insulin aspart (NOVOLOG) 100 UNIT/ML injection Inject 12 Units into the skin 3 (three) times daily before meals.    insulin detemir (LEVEMIR) 100 UNIT/ML injection Inject 0.14 mLs (14 Units total) into the skin at bedtime. Qty: 10 mL, Refills: 11    isosorbide dinitrate (ISORDIL) 30 MG tablet Take 30 mg by mouth daily.    levothyroxine (SYNTHROID, LEVOTHROID) 75 MCG tablet Take 1 tablet (75 mcg total) by mouth daily before breakfast. Qty: 90 tablet, Refills: 1    nitroGLYCERIN (NITROSTAT) 0.4 MG SL tablet Place 0.4 mg under the tongue every 5  (five) minutes as needed for chest pain.    Omega-3 Fatty Acids (FISH OIL) 1000 MG CAPS Take 1 capsule by mouth daily.    omeprazole (PRILOSEC) 40 MG capsule Take 40 mg by mouth daily.    pantoprazole (PROTONIX) 40 MG tablet Take 40 mg by mouth 2 (two) times daily.    pregabalin (LYRICA) 75 MG capsule Take 75 mg by mouth  2 (two) times daily.     rosuvastatin (CRESTOR) 20 MG tablet Take 1 tablet (20 mg total) by mouth at bedtime. Qty: 30 tablet, Refills: 5    sevelamer carbonate (RENVELA) 800 MG tablet Take 1,600 mg by mouth 3 (three) times daily with meals.    !! insulin aspart (NOVOLOG) 100 UNIT/ML injection Inject 0-7 Units into the skin 4 (four) times daily -  before meals and at bedtime. Qty: 10 mL, Refills: 11     !! - Potential duplicate medications found. Please discuss with provider.    STOP taking these medications     levofloxacin (LEVAQUIN) 500 MG tablet      ondansetron (ZOFRAN ODT) 4 MG disintegrating tablet      promethazine (PHENERGAN) 25 MG tablet         If you experience worsening of your admission symptoms, develop shortness of breath, life threatening emergency, suicidal or homicidal thoughts you must seek medical attention immediately by calling 911 or calling your MD immediately  if symptoms less severe.  You Must read complete instructions/literature along with all the possible adverse reactions/side effects for all the Medicines you take and that have been prescribed to you. Take any new Medicines after you have completely understood and accept all the possible adverse reactions/side effects.   Please note  You were cared for by a hospitalist during your hospital stay. If you have any questions about your discharge medications or the care you received while you were in the hospital after you are discharged, you can call the unit and asked to speak with the hospitalist on call if the hospitalist that took care of you is not available. Once you are  discharged, your primary care physician will handle any further medical issues. Please note that NO REFILLS for any discharge medications will be authorized once you are discharged, as it is imperative that you return to your primary care physician (or establish a relationship with a primary care physician if you do not have one) for your aftercare needs so that they can reassess your need for medications and monitor your lab values. Today   SUBJECTIVE   Some nausea earlier. Eating BF. Diarrhea/loose BMy'day  VITAL SIGNS:  Blood pressure 129/62, pulse 75, temperature 97.5 F (36.4 C), temperature source Oral, resp. rate 18, height 6\' 3"  (1.905 m), weight 174 lb 6.1 oz (79.1 kg), SpO2 100 %.  I/O:   Intake/Output Summary (Last 24 hours) at 10/13/15 0955 Last data filed at 10/13/15 0500  Gross per 24 hour  Intake    240 ml  Output    900 ml  Net   -660 ml    PHYSICAL EXAMINATION:  GENERAL:  44 y.o.-year-old patient lying in the bed with no acute distress.  EYES: Pupils equal, round, reactive to light and accommodation. No scleral icterus. Extraocular muscles intact.  HEENT: Head atraumatic, normocephalic. Oropharynx and nasopharynx clear.  NECK:  Supple, no jugular venous distention. No thyroid enlargement, no tenderness.  LUNGS: decreased breath sounds bilaterally, no wheezing, rales,rhonchi or crepitation. No use of accessory muscles of respiration.  CARDIOVASCULAR: S1, S2 normal. No murmurs, rubs, or gallops.  ABDOMEN: Soft, non-tender, non-distended. Bowel sounds present. No organomegaly or mass.  EXTREMITIES: No pedal edema, cyanosis, or clubbing.  NEUROLOGIC: Cranial nerves II through XII are intact. Muscle strength 5/5 in all extremities. Sensation intact. Gait not checked.  PSYCHIATRIC: The patient is alert and oriented x 3.  SKIN: No obvious rash, lesion, or ulcer.   DATA  REVIEW:   CBC   Recent Labs Lab 10/11/15 0358  WBC 5.8  HGB 9.4*  HCT 30.2*  PLT 111*     Chemistries   Recent Labs Lab 10/11/15 0358  NA 134*  K 3.5  CL 94*  CO2 30  GLUCOSE 272*  BUN 25*  CREATININE 2.78*  CALCIUM 8.7*    Microbiology Results   Recent Results (from the past 240 hour(s))  MRSA PCR Screening     Status: None   Collection Time: 10/10/15  6:02 PM  Result Value Ref Range Status   MRSA by PCR NEGATIVE NEGATIVE Final    Comment:        The GeneXpert MRSA Assay (FDA approved for NASAL specimens only), is one component of a comprehensive MRSA colonization surveillance program. It is not intended to diagnose MRSA infection nor to guide or monitor treatment for MRSA infections.   C difficile quick scan w PCR reflex     Status: Abnormal   Collection Time: 10/12/15 12:50 PM  Result Value Ref Range Status   C Diff antigen POSITIVE (A) NEGATIVE Final   C Diff toxin NEGATIVE NEGATIVE Final   C Diff interpretation   Final    Positive for toxigenic C. difficile, active toxin production not detected. Patient has toxigenic C. difficile organisms present in the bowel, but toxin was not detected. The patient may be a carrier or the level of toxin in the sample was below the limit  of detection. This information should be used in conjunction with the patient's clinical history when deciding on possible therapy.   Clostridium Difficile by PCR     Status: Abnormal   Collection Time: 10/12/15 12:50 PM  Result Value Ref Range Status   Toxigenic C Difficile by pcr POSITIVE (A) NEGATIVE Final    Comment: CRITICAL RESULT CALLED TO, READ BACK BY AND VERIFIED WITH: Dorita Fray, RN 10/12/2015 1454 BY JRS.     RADIOLOGY:  No results found.   Management plans discussed with the patient, family and they are in agreement.  CODE STATUS:     Code Status Orders        Start     Ordered   10/10/15 1510  Full code   Continuous     10/10/15 1527      TOTAL TIME TAKING CARE OF THIS PATIENT: 40 minutes.    Verley Pariseau M.D on 10/13/2015 at 9:55  AM  Between 7am to 6pm - Pager - [phone] After 6pm go to [email or web address] - password [email or web address] [facility]  [place] [facility]  Office  [phone]  CC: Primary care physician; [name], MD

## 2015-10-13 NOTE — Progress Notes (Signed)
Per glucometer CBG 60.

## 2015-10-13 NOTE — Progress Notes (Signed)
Notified Dr. Cherylann Ratel of patient refusing dialysis. Dr. Cherylann Ratel spoke with patient about risks of refusing treatment. After speaking with MD, patient is in agreement with dialysis treatment today.

## 2015-10-14 LAB — GLUCOSE, CAPILLARY
Glucose-Capillary: 140 mg/dL — ABNORMAL HIGH (ref 65–99)
Glucose-Capillary: 197 mg/dL — ABNORMAL HIGH (ref 65–99)
Glucose-Capillary: 227 mg/dL — ABNORMAL HIGH (ref 65–99)
Glucose-Capillary: 300 mg/dL — ABNORMAL HIGH (ref 65–99)

## 2015-10-14 MED ORDER — HEPARIN SODIUM (PORCINE) 5000 UNIT/ML IJ SOLN
5000.0000 [IU] | Freq: Two times a day (BID) | INTRAMUSCULAR | Status: DC
  Administered 2015-10-16: 5000 [IU] via SUBCUTANEOUS
  Filled 2015-10-14 (×2): qty 1

## 2015-10-14 NOTE — Care Management Note (Addendum)
Case Management Note  Patient Details  Name: Taylor [name] MRN: [id number] Date of Birth: Aug 15, 1971  Subjective/Objective:        Provided Taylor [name] with An Important Message From Medicare About your Rights form today. Tentative discharge planned for Tuesday. Taylor [name] was seen by the diabetes nurse and the [facility] nurse today. Taylor [name] apparently agreed to participate in the [facility] program.           Action/Plan:   Expected Discharge Date:                  Expected Discharge Plan:     In-House Referral:     Discharge planning Services     Post Acute Care Choice:    Choice offered to:     DME Arranged:    DME Agency:     HH Arranged:    HH Agency:     Status of Service:     Medicare Important Message Given:  Yes Date Medicare IM Given:    Medicare IM give by:    Date Additional Medicare IM Given:    Additional Medicare Important Message give by:     If discussed at Long Length of Stay Meetings, dates discussed:    Additional Comments:  Caitrin Pendergraph A, RN 10/14/2015, 3:53 PM

## 2015-10-14 NOTE — Progress Notes (Signed)
Inpatient Diabetes Program Recommendations  AACE/ADA: New Consensus Statement on Inpatient Glycemic Control (2015)  Target Ranges:  Prepandial:   less than 140 mg/dL      Peak postprandial:   less than 180 mg/dL (1-2 hours)      Critically ill patients:  140 - 180 mg/dL   Results for Taylor [name], Taylor [name] (MRN [id number]) as of 10/14/2015 10:15  Ref. Range 10/13/2015 07:43 10/13/2015 08:21 10/13/2015 11:32 10/13/2015 17:23 10/13/2015 21:01 10/14/2015 07:45  Glucose-Capillary Latest Ref Range: 65-99 mg/dL 60 (L) 371 (H) 96 062 (H) 240 (H) 140 (H)   Review of Glycemic Control  Diabetes history: DM1 Outpatient Diabetes medications: Levemir 14 units QHS, Novolog 12 units TID with meals Current orders for Inpatient glycemic control: Levemir 20 units QHS, Novolog 0-9 units TID with meals, Novolog 0-5 units HS  Inpatient Diabetes Program Recommendations: Insulin - Meal Coverage: Please consider ordering Novolog 3 units TID with meals for meal coverage (in addition to Novolog correction scale) if patient eats at least 50% of meal. Insulin-Basal- Fasting glucose 60 mg/dl yesterday and 694 mg/dl today. If patient has any further hypoglycemic episodes, may want to consider decreasing basal insulin.  Thanks, Orlando Penner, RN, MSN, CDE Diabetes Coordinator Inpatient Diabetes Program [phone] (Team Pager from 8am to 5pm) [phone] ([facility] office) [phone] [facility] office) [phone] [facility] office)

## 2015-10-14 NOTE — Progress Notes (Signed)
Patient ID: JAKARIE PEMBER, male   DOB: [birth year], 44 y.o.   MRN: [id number] [facility] - [place] [facility]   PATIENT NAME: Taylor [name]    MR#:  [id number]  DATE OF BIRTH:  01/08/1971  SUBJECTIVE:   Breathing is all right. Still having loose bowel movements from his stated. -Due for dialysis tomorrow  REVIEW OF SYSTEMS:   Review of Systems  Constitutional: Negative for fever, chills and weight loss.  HENT: Negative for ear discharge, ear pain and nosebleeds.   Eyes: Negative for blurred vision, pain and discharge.  Respiratory: Positive for shortness of breath. Negative for sputum production, wheezing and stridor.   Cardiovascular: Negative for chest pain, palpitations, orthopnea and PND.  Gastrointestinal: Positive for diarrhea. Negative for nausea, vomiting and abdominal pain.  Genitourinary: Negative for urgency and frequency.  Musculoskeletal: Negative for back pain and joint pain.  Neurological: Positive for weakness. Negative for sensory change, speech change and focal weakness.  Psychiatric/Behavioral: Negative for depression and hallucinations. The patient is not nervous/anxious.   All other systems reviewed and are negative.  Tolerating Diet: Yes  DRUG ALLERGIES:   Allergies  Allergen Reactions  . Amoxicillin-Pot Clavulanate Nausea And Vomiting and Itching  . Rifampin Nausea And Vomiting  . Tape Other (See Comments)    VITALS:  Blood pressure 135/67, pulse 80, temperature 98.5 F (36.9 C), temperature source Oral, resp. rate 17, height 6\' 3"  (1.905 m), weight 81.3 kg (179 lb 3.7 oz), SpO2 100 %.  PHYSICAL EXAMINATION:   Physical Exam  GENERAL:  44 y.o.-year-old patient lying in the bed with no acute distress.  EYES: Pupils equal, round, reactive to light and accommodation. No scleral icterus. Extraocular muscles intact.  HEENT: Head atraumatic, normocephalic. Oropharynx and nasopharynx clear.  NECK:  Supple, no  jugular venous distention. No thyroid enlargement, no tenderness.  LUNGS: Normal breath sounds bilaterally, no wheezing, rales, rhonchi. No use of accessory muscles of respiration.  CARDIOVASCULAR: S1, S2 normal. No murmurs, rubs, or gallops.  ABDOMEN: Soft, nontender, nondistended. Bowel sounds present. No organomegaly or mass.  EXTREMITIES: No cyanosis, clubbing or edema b/l.    NEUROLOGIC: Cranial nerves II through XII are intact. No new focal Motor or sensory deficits b/l.   PSYCHIATRIC: The patient is alert and oriented x 3.  SKIN: No obvious rash, lesion, or ulcer.    LABORATORY PANEL:   CBC  Recent Labs Lab 10/13/15 1346  WBC 7.0  HGB 9.3*  HCT 29.7*  PLT 117*    Chemistries   Recent Labs Lab 10/13/15 1347  NA 127*  K 5.9*  CL 90*  CO2 27  GLUCOSE 155*  BUN 43*  CREATININE 3.53*  CALCIUM 8.1*    Cardiac Enzymes No results for input(s): TROPONINI in the last 168 hours.  RADIOLOGY:  No results found.   ASSESSMENT AND PLAN:  Taylor [name] is a 44 y.o. male with a known history of htn, diabetes mellitus type 2, ESRD on hemodialysis comes in because of shortness of breath. Patient finished hemodialysis today, after that the noted to have shortness of breath   #1 shortness of breath today suspected due to congestive heart failure diastolic acute on chronic- improved with dialysis - on chronic home oxygen.  -x-ray congestive heart failure,no findings of pneumonia. -Antibiotics discontinued.   #2 end-stage renal disease on hemodialysis with ultrafiltration Sats stable 100% on 3 L. ration uses 3 L home oxygen. -Appreciate nephrology consult. -Due for hemodialysis tomorrow   #3 diabetes  mellitus type 2: continue sliding scale insulin and Levemir. -Hypoglycemic yesterday morning. Appreciate diabetes coordinator input. -Continue to monitor blood sugars to see if his basal insulin needs to be adjusted   #4 C. difficile colitis-diarrhea is improving.  Still having about 5-6 bowel movements per day. -Continue Flagyl for now. Add probiotics as well   #5 hypertension controlled continue clonidine, Coreg, Isordil, Lasix.  #6 history of left carotid stenosis: Recently started on Plavix 3 weeks ago. Continue that.  Case discussed with Care Management/Social Worker. Management plans discussed with the patient,and they are in agreement.  CODE STATUS: Full DVT Prophylaxis: heparin  TOTAL TIME TAKING CARE OF THIS PATIENT:  32 minutes.   POSSIBLE D/C tomorrow, DEPENDING ON CLINICAL CONDITION.   Taylor [name] M.D on 10/14/2015 at 4:02 PM  Between 7am to 6pm - Pager - [phone]  After 6pm go to [email or web address] - password [email or web address] [facility]  [place] [facility]  Office  [phone]  CC: Primary care physician; [name], MD

## 2015-10-14 NOTE — Progress Notes (Signed)
[facility]  ROUNDING NOTE   Subjective:   Hemodialysis yesterday. Tolerated treatment. UF of 2.2 litres Admitted for C. Diff colitis.   Objective:  Vital signs in last 24 hours:  Temp:  [97.7 F (36.5 C)-98.3 F (36.8 C)] 98.2 F (36.8 C) (12/05 0835) Pulse Rate:  [74-82] 80 (12/05 0835) Resp:  [16-25] 18 (12/05 0835) BP: (135-150)/(65-97) 142/70 mmHg (12/05 0835) SpO2:  [99 %-100 %] 100 % (12/05 0835) Weight:  [81.3 kg (179 lb 3.7 oz)] 81.3 kg (179 lb 3.7 oz) (12/04 1430)  Weight change:  Filed Weights   10/10/15 1803 10/11/15 1010 10/13/15 1430  Weight: 76.386 kg (168 lb 6.4 oz) 79.1 kg (174 lb 6.1 oz) 81.3 kg (179 lb 3.7 oz)    Intake/Output: I/O last 3 completed shifts: In: 240 [P.O.:240] Out: 3000 [Urine:1500; Other:1500]   Intake/Output this shift:     Physical Exam: General: NAD, laying bed  Head: Normocephalic, atraumatic. Moist oral mucosal membranes  Eyes: Anicteric  Neck: Supple, trachea midline  Lungs:  clear, normal effort   Heart: Regular rate and rhythm  Abdomen:  Soft, nontender, BS present , obese  Extremities: 1+ peripheral edema. Bilateral toe amputations  Neurologic: Nonfocal, moving all four extremities  Skin: No lesions  Access: LUE AVF    Basic Metabolic Panel:  Recent Labs Lab 10/10/15 1418 10/11/15 0358 10/11/15 1014 10/13/15 1347  NA 134* 134*  --  127*  K 3.6 3.5  --  5.9*  CL 92* 94*  --  90*  CO2 31 30  --  27  GLUCOSE 309* 272*  --  155*  BUN 19 25*  --  43*  CREATININE 2.41* 2.78*  --  3.53*  CALCIUM 8.4* 8.7*  --  8.1*  PHOS  --   --  3.6 4.0    Liver Function Tests:  Recent Labs Lab 10/13/15 1347  ALBUMIN 2.4*   No results for input(s): LIPASE, AMYLASE in the last 168 hours. No results for input(s): AMMONIA in the last 168 hours.  CBC:  Recent Labs Lab 10/10/15 1418 10/11/15 0358 10/13/15 1346  WBC 8.2 5.8 7.0  NEUTROABS 6.9*  --   --   HGB 10.0* 9.4* 9.3*  HCT 31.9* 30.2* 29.7*   MCV 91.0 91.4 91.8  PLT 106* 111* 117*    Cardiac Enzymes: No results for input(s): CKTOTAL, CKMB, CKMBINDEX, TROPONINI in the last 168 hours.  BNP: Invalid input(s): POCBNP  CBG:  Recent Labs Lab 10/13/15 0821 10/13/15 1132 10/13/15 1723 10/13/15 2101 10/14/15 0745  GLUCAP 106* 96 120* 240* 140*    Microbiology: Results for orders placed or performed during the hospital encounter of 10/10/15  MRSA PCR Screening     Status: None   Collection Time: 10/10/15  6:02 PM  Result Value Ref Range Status   MRSA by PCR NEGATIVE NEGATIVE Final    Comment:        The GeneXpert MRSA Assay (FDA approved for NASAL specimens only), is one component of a comprehensive MRSA colonization surveillance program. It is not intended to diagnose MRSA infection nor to guide or monitor treatment for MRSA infections.   C difficile quick scan w PCR reflex     Status: Abnormal   Collection Time: 10/12/15 12:50 PM  Result Value Ref Range Status   C Diff antigen POSITIVE (A) NEGATIVE Final   C Diff toxin NEGATIVE NEGATIVE Final   C Diff interpretation   Final    Positive for toxigenic C. difficile,  active toxin production not detected. Patient has toxigenic C. difficile organisms present in the bowel, but toxin was not detected. The patient may be a carrier or the level of toxin in the sample was below the limit  of detection. This information should be used in conjunction with the patient's clinical history when deciding on possible therapy.   Clostridium Difficile by PCR     Status: Abnormal   Collection Time: 10/12/15 12:50 PM  Result Value Ref Range Status   Toxigenic C Difficile by pcr POSITIVE (A) NEGATIVE Final    Comment: CRITICAL RESULT CALLED TO, READ BACK BY AND VERIFIED WITH: Dorita Fray, RN 10/12/2015 1454 BY JRS.     Coagulation Studies: No results for input(s): LABPROT, INR in the last 72 hours.  Urinalysis: No results for input(s): COLORURINE, LABSPEC, PHURINE,  GLUCOSEU, HGBUR, BILIRUBINUR, KETONESUR, PROTEINUR, UROBILINOGEN, NITRITE, LEUKOCYTESUR in the last 72 hours.  Invalid input(s): APPERANCEUR    Imaging: No results found.   Medications:     . amitriptyline  25 mg Oral QHS  . antiseptic oral rinse  7 mL Mouth Rinse BID  . aspirin  325 mg Oral Daily  . carvedilol  25 mg Oral BID WC  . cinacalcet  30 mg Oral Q breakfast  . cloNIDine  0.1 mg Oral BID  . clopidogrel  75 mg Oral Daily  . [START ON 10/15/2015] epoetin (EPOGEN/PROCRIT) injection  10,000 Units Intravenous Q T,Th,Sa-HD  . furosemide  80 mg Oral BID  . heparin subcutaneous  5,000 Units Subcutaneous 3 times per day  . insulin aspart  0-5 Units Subcutaneous QHS  . insulin aspart  0-9 Units Subcutaneous TID WC  . insulin detemir  20 Units Subcutaneous QHS  . isosorbide dinitrate  30 mg Oral Daily  . levothyroxine  75 mcg Oral QAC breakfast  . metroNIDAZOLE  500 mg Oral 3 times per day  . omega-3 acid ethyl esters  1 g Oral Daily  . pantoprazole  40 mg Oral Daily  . pregabalin  75 mg Oral BID  . rosuvastatin  20 mg Oral QHS  . sevelamer carbonate  1,600 mg Oral TID WC   sodium chloride, sodium chloride, acetaminophen, alteplase, alum & mag hydroxide-simeth, benzonatate, heparin, lidocaine (PF), lidocaine-prilocaine, nitroGLYCERIN, ondansetron, pentafluoroprop-tetrafluoroeth, promethazine  Assessment/ Plan:  44 y.o. 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  End-stage renal disease on hemodialysis Tuesday, Thursday, Saturday.   - tolerated dialysis yesterday. Plan to continue TTS schedule. No acute indication for dialysis today.   2.  Anemia chronic kidney disease. Hemoglobin 9.3.  With thrombocytopenia.  - Pt will continue Epogen with dialysis.  3.  Secondary hyperparathyroidism.   Phos 4, PTH 107 - Continue Renvela 1600 mg by mouth 3 times a day with meals - discontinue cinacalcet.   4.   C. Difficile colitis.   -  Metronidazole.  5. Hypertension: blood pressure at goal.  - clonidine, carvedilol, fursoemide, isosorbide mononitrate   LOS: 4 Dilyn Smiles 12/5/20169:54 AM

## 2015-10-14 NOTE — Consult Note (Addendum)
   [facility] CM Inpatient Consult   10/14/2015  Taylor [name] [birth year] [id number]   EPIC referral received for [facility] Care Management services. Called in room and spoke with patient to discuss and explain [facility] Care Management services. Of note, he recently declined [facility] services. However, patient is agreeable to [facility] services today. Verbal consent received for [facility] follow up for post transition of care call and possible monthly home visits. He will need symptom and disease management for DM. Patient endorses his blood sugars "are usually all over the place". He denies having issues with transportation or affording medications. Endorses his Primary Care MD is Dr. [name]. He states he goes to HD on Tuesday, Thursdays, and Saturdays. Confirmed best contact number as [phone]. Explained to patient that [facility] will not interfere or replace services provided by home health if he has it.  Will request for patient to be assigned to [facility] RNCM. Will also make inpatient RNCM aware [facility] will follow.  Raiford Noble, MSN-Ed, RN,BSN [facility] Liaison [phone]

## 2015-10-14 NOTE — Progress Notes (Signed)
Changed order for heparin 5000 units subcutaneously q8h to q12h since patient is on HD per anticoagulation protocol.   Taylor [name] 1:29 PM

## 2015-10-14 NOTE — Care Management Important Message (Signed)
Important Message  Patient Details  Name: Taylor [name] MRN: [id number] Date of Birth: [birth year]   Medicare Important Message Given:  Yes    Shandel Busic A, RN 10/14/2015, 3:50 PM

## 2015-10-15 DIAGNOSIS — A0472 Enterocolitis due to Clostridium difficile, not specified as recurrent: Secondary | ICD-10-CM

## 2015-10-15 DIAGNOSIS — I5033 Acute on chronic diastolic (congestive) heart failure: Secondary | ICD-10-CM

## 2015-10-15 LAB — BASIC METABOLIC PANEL
ANION GAP: 9 (ref 5–15)
BUN: 51 mg/dL — ABNORMAL HIGH (ref 6–20)
CHLORIDE: 90 mmol/L — AB (ref 101–111)
CO2: 29 mmol/L (ref 22–32)
Calcium: 8.2 mg/dL — ABNORMAL LOW (ref 8.9–10.3)
Creatinine, Ser: 3.68 mg/dL — ABNORMAL HIGH (ref 0.61–1.24)
GFR calc Af Amer: 21 mL/min — ABNORMAL LOW (ref >60)
GFR, EST NON AFRICAN AMERICAN: 18 mL/min — AB (ref >60)
Glucose, Bld: 188 mg/dL — ABNORMAL HIGH (ref 65–99)
POTASSIUM: 5.1 mmol/L (ref 3.5–5.1)
SODIUM: 128 mmol/L — AB (ref 135–145)

## 2015-10-15 LAB — CBC
HCT: 26.9 % — ABNORMAL LOW (ref 40.0–52.0)
HEMOGLOBIN: 8.6 g/dL — AB (ref 13.0–18.0)
MCH: 29.1 pg (ref 26.0–34.0)
MCHC: 32.1 g/dL (ref 32.0–36.0)
MCV: 90.8 fL (ref 80.0–100.0)
PLATELETS: 113 10*3/uL — AB (ref 150–440)
RBC: 2.96 MIL/uL — AB (ref 4.40–5.90)
RDW: 15.7 % — ABNORMAL HIGH (ref 11.5–14.5)
WBC: 6.8 10*3/uL (ref 3.8–10.6)

## 2015-10-15 LAB — GLUCOSE, CAPILLARY
GLUCOSE-CAPILLARY: 115 mg/dL — AB (ref 65–99)
GLUCOSE-CAPILLARY: 109 mg/dL — AB (ref 65–99)
GLUCOSE-CAPILLARY: 186 mg/dL — AB (ref 65–99)
GLUCOSE-CAPILLARY: 365 mg/dL — AB (ref 65–99)

## 2015-10-15 MED ORDER — METRONIDAZOLE 500 MG PO TABS
500.0000 mg | ORAL_TABLET | Freq: Three times a day (TID) | ORAL | Status: AC
Start: 2015-10-15 — End: 2015-10-22

## 2015-10-15 MED ORDER — PROBIOTIC PO CAPS: 1.0000 | ORAL_CAPSULE | Freq: Every day | ORAL | Status: DC

## 2015-10-15 NOTE — Progress Notes (Signed)
Pre-hd tx 

## 2015-10-15 NOTE — Discharge Summary (Signed)
[facility] - [facility]   PATIENT NAME: Taylor [name]    MR#:  [id number]  DATE OF BIRTH:  [birth year]  DATE OF ADMISSION:  10/10/2015 ADMITTING PHYSICIAN: Katha Hamming, MD  DATE OF DISCHARGE: 10/15/2015  PRIMARY CARE PHYSICIAN: [name], MD    ADMISSION DIAGNOSIS:  Shortness of breath [R06.02] Acute pulmonary edema (HCC) [J81.0]  DISCHARGE DIAGNOSIS:  Principal Problem:   Acute on chronic congestive heart failure with left ventricular diastolic dysfunction (HCC) Active Problems:   SOB (shortness of breath)   Pressure ulcer   Clostridium difficile colitis   SECONDARY DIAGNOSIS:   Past Medical History  Diagnosis Date  . End stage renal disease on dialysis (HCC)     LUE fistula  . Type I diabetes mellitus (HCC)     a. 03/2014 admitted with HNK to [facility]. b. TTS  . Diabetic neuropathy (HCC)     severe, s/p multiple toe amputation  . Hypothyroidism   . Hypertension   . Hyperlipidemia   . H/O hiatal hernia   . GERD (gastroesophageal reflux disease)   . Anxiety   . Sebaceous cyst     side of neck  . Pneumonia     2010  . Anemia     a. req PRBC's 2011.  [name] PAD (peripheral artery disease) (HCC)     a. s/p amputation of toes on the right;  b. left LE claudication.  . Coronary artery disease     a. s/p MI;  b. 10/2009 CABG x 3 @ [facility]: LIMA->LAD, VG->OM3, VG->RPDA; c. 11/2010 Cath 3/3 patent grafts;  d 12/2012 Cath: LM 30d, LAD 85p, D1 70, D2 90, LCX 40ost, OM2 100, RCA 90p, 129m, L->LAD ok, VG->OM3 ok, VG->RPDA 30, EF 50%->Med Rx.  . Cataract     right  . Valvular disease     a. 11/2012 Echo: EF 55-60%, mild LVH, mild MR, mild bi-atrial enlargement, mild-mod TR, PASP ; b. echo 03/2014: EF 50-55%, nl WM, select images concerning for bicuspid aortic valve w/ nl thicknes of leaflets, mild MR, mild bi-atrial dilatation, RV mild dilatation - wall thickness nl, mod TR - select images appears to be mod to sev, PASP at least mod  elevated.  . Diastolic CHF (HCC)     see echo above  . Depression   . Arthritis     rheumatoid arthritis   . Myocardial infarction (HCC) 2010  . Pulmonary HTN (HCC)     a. continuation of 03/2014 echo PASP @ least mod elevated. RVSP 52 mm Hg. Parasternal long axis estimated @ 85 mm Hg  . Scrotal abscess     a. s/p multiple I&D  . Penile abscess     a. s/p multiple I&D  . Hematemesis     a. EGD 2016 with LA Grade C esophagitis, continued on Protonix   . Chronic respiratory failure (HCC)     a. on 3L oxygen via nasal cannula  . Anxiety   . Acute delirium   . Sepsis (HCC)   . 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 MYOCARDIAL INFARCTION, HX OF 01/26/2011    Qualifier: Diagnosis of  By: [name] MD, [name]    . HTN (hypertension) 02/11/2011  . Pulmonary hypertension associated with end stage renal disease on dialysis (HCC) 04/16/2014  . Hyperkalemia   . Hypothyroidism   . Osteomyelitis [facility])     HOSPITAL COURSE:   Firman Vanderheide is a 44 y.o. male with a known history of htn, diabetes  mellitus type 2, ESRD on hemodialysis comes in because of shortness of breath.   #1 Congestive heart failure diastolic acute on chronic- improved with dialysis - on chronic home oxygen. 3L -x-ray congestive heart failure,no findings of pneumonia. - improved with dialysis, sats are better -Antibiotics discontinued.   #2 end-stage renal disease on hemodialysis with ultrafiltration Sats stable 100% on 3 L.  -Appreciate nephrology consult. -Due for hemodialysis today per schedule- will be done prior to discharge  #3 diabetes mellitus type 2: continue novolog and Levemir. - Appreciate diabetes coordinator input.  #4 C. difficile colitis-diarrhea is improved.  -Continue Flagyl for now. Added probiotics as well   #5 hypertension controlled continue clonidine, Coreg, Isordil, Lasix.  #6 history of left carotid stenosis: Recently started on Plavix 3 weeks ago. Continue that.  Patient will be  discharged today after dialysis  DISCHARGE CONDITIONS:   Guarded with poor long term prognosis  CONSULTS OBTAINED:  Treatment Team:  Mady Haagensen, MD Enedina Finner, MD  DRUG ALLERGIES:   Allergies  Allergen Reactions  . Amoxicillin-Pot Clavulanate Nausea And Vomiting and Itching  . Rifampin Nausea And Vomiting  . Tape Other (See Comments)    DISCHARGE MEDICATIONS:   Current Discharge Medication List    START taking these medications   Details  metroNIDAZOLE (FLAGYL) 500 MG tablet Take 1 tablet (500 mg total) by mouth every 8 (eight) hours. Qty: 24 tablet, Refills: 0    Probiotic CAPS Take 1 capsule by mouth daily. Qty: 10 capsule, Refills: 0      CONTINUE these medications which have NOT CHANGED   Details  acetaminophen (TYLENOL) 500 MG tablet Take 1,000 mg by mouth every 6 (six) hours as needed for mild pain or headache.     alum & mag hydroxide-simeth (MAALOX PLUS) 400-400-40 MG/5ML suspension Take 30 mLs by mouth every 4 (four) hours as needed for indigestion.     amitriptyline (ELAVIL) 25 MG tablet Take 1 tablet (25 mg total) by mouth at bedtime. Qty: 30 tablet, Refills: 1    aspirin 325 MG EC tablet Take 325 mg by mouth daily.    carvedilol (COREG) 25 MG tablet Take 1 tablet (25 mg total) by mouth 2 (two) times daily with a meal. Qty: 180 tablet, Refills: 1    cinacalcet (SENSIPAR) 30 MG tablet Take 30 mg by mouth daily.    cloNIDine (CATAPRES) 0.1 MG tablet Take 0.1 mg by mouth 2 (two) times daily.    clopidogrel (PLAVIX) 75 MG tablet Take 1 tablet (75 mg total) by mouth daily. Qty: 90 tablet, Refills: 3    DHA-EPA-VITAMIN E PO Take 1 capsule by mouth daily.    furosemide (LASIX) 80 MG tablet Take 80 mg by mouth 2 (two) times daily.    !! insulin aspart (NOVOLOG) 100 UNIT/ML injection Inject 12 Units into the skin 3 (three) times daily before meals.    insulin detemir (LEVEMIR) 100 UNIT/ML injection Inject 0.14 mLs (14 Units total) into the skin at  bedtime. Qty: 10 mL, Refills: 11    isosorbide dinitrate (ISORDIL) 30 MG tablet Take 30 mg by mouth daily.    levothyroxine (SYNTHROID, LEVOTHROID) 75 MCG tablet Take 1 tablet (75 mcg total) by mouth daily before breakfast. Qty: 90 tablet, Refills: 1    nitroGLYCERIN (NITROSTAT) 0.4 MG SL tablet Place 0.4 mg under the tongue every 5 (five) minutes as needed for chest pain.    Omega-3 Fatty Acids (FISH OIL) 1000 MG CAPS Take 1 capsule by mouth daily.  pantoprazole (PROTONIX) 40 MG tablet Take 40 mg by mouth 2 (two) times daily.    pregabalin (LYRICA) 75 MG capsule Take 75 mg by mouth 2 (two) times daily.     rosuvastatin (CRESTOR) 20 MG tablet Take 1 tablet (20 mg total) by mouth at bedtime. Qty: 30 tablet, Refills: 5    sevelamer carbonate (RENVELA) 800 MG tablet Take 1,600 mg by mouth 3 (three) times daily with meals.    !! insulin aspart (NOVOLOG) 100 UNIT/ML injection Inject 0-7 Units into the skin 4 (four) times daily -  before meals and at bedtime. Qty: 10 mL, Refills: 11     !! - Potential duplicate medications found. Please discuss with provider.    STOP taking these medications     levofloxacin (LEVAQUIN) 500 MG tablet      omeprazole (PRILOSEC) 40 MG capsule      ondansetron (ZOFRAN ODT) 4 MG disintegrating tablet      promethazine (PHENERGAN) 25 MG tablet          DISCHARGE INSTRUCTIONS:   1. PCP f/u in 1-2 weeks 2. F/u at CHF clinic as scheduled 3. For dialysis in 2 days as per schedule  If you experience worsening of your admission symptoms, develop shortness of breath, life threatening emergency, suicidal or homicidal thoughts you must seek medical attention immediately by calling 911 or calling your MD immediately  if symptoms less severe.  You Must read complete instructions/literature along with all the possible adverse reactions/side effects for all the Medicines you take and that have been prescribed to you. Take any new Medicines after you have  completely understood and accept all the possible adverse reactions/side effects.   Please note  You were cared for by a hospitalist during your hospital stay. If you have any questions about your discharge medications or the care you received while you were in the hospital after you are discharged, you can call the unit and asked to speak with the hospitalist on call if the hospitalist that took care of you is not available. Once you are discharged, your primary care physician will handle any further medical issues. Please note that NO REFILLS for any discharge medications will be authorized once you are discharged, as it is imperative that you return to your primary care physician (or establish a relationship with a primary care physician if you do not have one) for your aftercare needs so that they can reassess your need for medications and monitor your lab values.    Today   CHIEF COMPLAINT:   Chief Complaint  Patient presents with  . Shortness of Breath  . Cough    VITAL SIGNS:  Blood pressure 144/72, pulse 80, temperature 98.2 F (36.8 C), temperature source Oral, resp. rate 16, height 6\' 3"  (1.905 m), weight 81.3 kg (179 lb 3.7 oz), SpO2 100 %.  I/O:   Intake/Output Summary (Last 24 hours) at 10/15/15 0839 Last data filed at 10/15/15 0422  Gross per 24 hour  Intake      0 ml  Output    775 ml  Net   -775 ml    PHYSICAL EXAMINATION:   Physical Exam  GENERAL:  44 y.o.-year-old patient lying in the bed with no acute distress.  EYES: Pupils equal, round, reactive to light and accommodation. No scleral icterus. Extraocular muscles intact.  HEENT: Head atraumatic, normocephalic. Oropharynx and nasopharynx clear.  NECK:  Supple, no jugular venous distention. No thyroid enlargement, no tenderness.  LUNGS: Normal breath sounds  bilaterally, no wheezing, rales,rhonchi or crepitation. No use of accessory muscles of respiration. Decreased bibasilar breath sounds CARDIOVASCULAR: S1,  S2 normal. No rubs, or gallops. 3/6 systolic murmur is present ABDOMEN: Abdomen is distended, but soft and nontender. Bowel sounds present. No organomegaly or mass.  EXTREMITIES: No pedal edema, no cyanosis, or clubbing. Status post right transmetatarsal amputation and also amputation of left toes NEUROLOGIC: Cranial nerves II through XII are intact. No new focal motor deficits. Sensation intact. Gait is not checked.  PSYCHIATRIC: The patient is alert and oriented x 3.  SKIN: No obvious rash, lesion, or ulcer.   DATA REVIEW:   CBC  Recent Labs Lab 10/15/15 0439  WBC 6.8  HGB 8.6*  HCT 26.9*  PLT 113*    Chemistries   Recent Labs Lab 10/15/15 0439  NA 128*  K 5.1  CL 90*  CO2 29  GLUCOSE 188*  BUN 51*  CREATININE 3.68*  CALCIUM 8.2*    Cardiac Enzymes No results for input(s): TROPONINI in the last 168 hours.  Microbiology Results  Results for orders placed or performed during the hospital encounter of 10/10/15  MRSA PCR Screening     Status: None   Collection Time: 10/10/15  6:02 PM  Result Value Ref Range Status   MRSA by PCR NEGATIVE NEGATIVE Final    Comment:        The GeneXpert MRSA Assay (FDA approved for NASAL specimens only), is one component of a comprehensive MRSA colonization surveillance program. It is not intended to diagnose MRSA infection nor to guide or monitor treatment for MRSA infections.   C difficile quick scan w PCR reflex     Status: Abnormal   Collection Time: 10/12/15 12:50 PM  Result Value Ref Range Status   C Diff antigen POSITIVE (A) NEGATIVE Final   C Diff toxin NEGATIVE NEGATIVE Final   C Diff interpretation   Final    Positive for toxigenic C. difficile, active toxin production not detected. Patient has toxigenic C. difficile organisms present in the bowel, but toxin was not detected. The patient may be a carrier or the level of toxin in the sample was below the limit  of detection. This information should be used in  conjunction with the patient's clinical history when deciding on possible therapy.   Clostridium Difficile by PCR     Status: Abnormal   Collection Time: 10/12/15 12:50 PM  Result Value Ref Range Status   Toxigenic C Difficile by pcr POSITIVE (A) NEGATIVE Final    Comment: CRITICAL RESULT CALLED TO, READ BACK BY AND VERIFIED WITH: Dorita Fray, RN 10/12/2015 1454 BY JRS.     RADIOLOGY:  No results found.  EKG:   Orders placed or performed during the hospital encounter of 10/10/15  . ED EKG  . ED EKG      Management plans discussed with the patient, family and they are in agreement.  CODE STATUS:     Code Status Orders        Start     Ordered   10/10/15 1510  Full code   Continuous     10/10/15 1527      TOTAL TIME TAKING CARE OF THIS PATIENT: 37 minutes.    Enid Baas M.D on 10/15/2015 at 8:39 AM  Between 7am to 6pm - Pager - [phone]  After 6pm go to [email or web address] - password [email or web address] [facility]  [facility]  Office  [phone]  CC: Primary care physician; [name], MD

## 2015-10-15 NOTE — Care Management (Signed)
Spoke with patient regarding discharge today and he is in agreement.  Discussed whether he thinks he would benefit from home health nurse and he denies need

## 2015-10-15 NOTE — Progress Notes (Signed)
[facility]  ROUNDING NOTE   Subjective:   Seen and examined on hemdialysis today. Tolerating treatment. UF goal of 3 litres.   Objective:  Vital signs in last 24 hours:  Temp:  [98.2 F (36.8 C)-98.6 F (37 C)] 98.2 F (36.8 C) (12/06 0930) Pulse Rate:  [79-82] 80 (12/06 1030) Resp:  [16-18] 18 (12/06 1030) BP: (124-144)/(53-72) 128/53 mmHg (12/06 1030) SpO2:  [97 %-100 %] 100 % (12/06 0930) Weight:  [82.4 kg (181 lb 10.5 oz)] 82.4 kg (181 lb 10.5 oz) (12/06 0930)  Weight change:  Filed Weights   10/11/15 1010 10/13/15 1430 10/15/15 0930  Weight: 79.1 kg (174 lb 6.1 oz) 81.3 kg (179 lb 3.7 oz) 82.4 kg (181 lb 10.5 oz)    Intake/Output: I/O last 3 completed shifts: In: -  Out: 975 [Urine:975]   Intake/Output this shift:  Total I/O In: 120 [P.O.:120] Out: -   Physical Exam: General: NAD, laying bed  Head: Normocephalic, atraumatic. Moist oral mucosal membranes  Eyes: Anicteric  Neck: Supple, trachea midline  Lungs:  clear, normal effort   Heart: Regular rate and rhythm  Abdomen:  Soft, nontender, BS present , obese  Extremities: 1+ peripheral edema. Bilateral toe amputations  Neurologic: Nonfocal, moving all four extremities  Skin: No lesions  Access: LUE AVF    Basic Metabolic Panel:  Recent Labs Lab 10/10/15 1418 10/11/15 0358 10/11/15 1014 10/13/15 1347 10/15/15 0439  NA 134* 134*  --  127* 128*  K 3.6 3.5  --  5.9* 5.1  CL 92* 94*  --  90* 90*  CO2 31 30  --  27 29  GLUCOSE 309* 272*  --  155* 188*  BUN 19 25*  --  43* 51*  CREATININE 2.41* 2.78*  --  3.53* 3.68*  CALCIUM 8.4* 8.7*  --  8.1* 8.2*  PHOS  --   --  3.6 4.0  --     Liver Function Tests:  Recent Labs Lab 10/13/15 1347  ALBUMIN 2.4*   No results for input(s): LIPASE, AMYLASE in the last 168 hours. No results for input(s): AMMONIA in the last 168 hours.  CBC:  Recent Labs Lab 10/10/15 1418 10/11/15 0358 10/13/15 1346 10/15/15 0439  WBC 8.2 5.8 7.0 6.8   NEUTROABS 6.9*  --   --   --   HGB 10.0* 9.4* 9.3* 8.6*  HCT 31.9* 30.2* 29.7* 26.9*  MCV 91.0 91.4 91.8 90.8  PLT 106* 111* 117* 113*    Cardiac Enzymes: No results for input(s): CKTOTAL, CKMB, CKMBINDEX, TROPONINI in the last 168 hours.  BNP: Invalid input(s): POCBNP  CBG:  Recent Labs Lab 10/14/15 0745 10/14/15 1147 10/14/15 1630 10/14/15 2133 10/15/15 0735  GLUCAP 140* 197* 227* 300* 115*    Microbiology: Results for orders placed or performed during the hospital encounter of 10/10/15  MRSA PCR Screening     Status: None   Collection Time: 10/10/15  6:02 PM  Result Value Ref Range Status   MRSA by PCR NEGATIVE NEGATIVE Final    Comment:        The GeneXpert MRSA Assay (FDA approved for NASAL specimens only), is one component of a comprehensive MRSA colonization surveillance program. It is not intended to diagnose MRSA infection nor to guide or monitor treatment for MRSA infections.   C difficile quick scan w PCR reflex     Status: Abnormal   Collection Time: 10/12/15 12:50 PM  Result Value Ref Range Status   C Diff antigen  POSITIVE (A) NEGATIVE Final   C Diff toxin NEGATIVE NEGATIVE Final   C Diff interpretation   Final    Positive for toxigenic C. difficile, active toxin production not detected. Patient has toxigenic C. difficile organisms present in the bowel, but toxin was not detected. The patient may be a carrier or the level of toxin in the sample was below the limit  of detection. This information should be used in conjunction with the patient's clinical history when deciding on possible therapy.   Clostridium Difficile by PCR     Status: Abnormal   Collection Time: 10/12/15 12:50 PM  Result Value Ref Range Status   Toxigenic C Difficile by pcr POSITIVE (A) NEGATIVE Final    Comment: CRITICAL RESULT CALLED TO, READ BACK BY AND VERIFIED WITH: Dorita Fray, RN 10/12/2015 1454 BY JRS.     Coagulation Studies: No results for input(s): LABPROT,  INR in the last 72 hours.  Urinalysis: No results for input(s): COLORURINE, LABSPEC, PHURINE, GLUCOSEU, HGBUR, BILIRUBINUR, KETONESUR, PROTEINUR, UROBILINOGEN, NITRITE, LEUKOCYTESUR in the last 72 hours.  Invalid input(s): APPERANCEUR    Imaging: No results found.   Medications:     . amitriptyline  25 mg Oral QHS  . antiseptic oral rinse  7 mL Mouth Rinse BID  . aspirin  325 mg Oral Daily  . carvedilol  25 mg Oral BID WC  . cloNIDine  0.1 mg Oral BID  . clopidogrel  75 mg Oral Daily  . epoetin (EPOGEN/PROCRIT) injection  10,000 Units Intravenous Q T,Th,Sa-HD  . furosemide  80 mg Oral BID  . heparin subcutaneous  5,000 Units Subcutaneous Q12H  . insulin aspart  0-5 Units Subcutaneous QHS  . insulin aspart  0-9 Units Subcutaneous TID WC  . insulin detemir  20 Units Subcutaneous QHS  . isosorbide dinitrate  30 mg Oral Daily  . levothyroxine  75 mcg Oral QAC breakfast  . metroNIDAZOLE  500 mg Oral 3 times per day  . omega-3 acid ethyl esters  1 g Oral Daily  . pantoprazole  40 mg Oral Daily  . pregabalin  75 mg Oral BID  . rosuvastatin  20 mg Oral QHS  . sevelamer carbonate  1,600 mg Oral TID WC   sodium chloride, sodium chloride, acetaminophen, alteplase, alum & mag hydroxide-simeth, benzonatate, heparin, lidocaine (PF), lidocaine-prilocaine, nitroGLYCERIN, ondansetron, pentafluoroprop-tetrafluoroeth, promethazine  Assessment/ Plan:  44 y.o. 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  End-stage renal disease on hemodialysis Tuesday, Thursday, Saturday.   - tolerating dialysis today. Plan to continue TTS schedule.   2.  Anemia chronic kidney disease. Hemoglobin 8.6. With  thrombocytopenia. Platelets 113 - Pt will continue Epogen with dialysis.  3.  Secondary hyperparathyroidism.   Phos 4, PTH 107 - Continue Renvela 1600 mg by mouth 3 times a day with meals - discontinued cinacalcet.   4.   C. Difficile colitis.   -  Metronidazole.  5. Hypertension: blood pressure at goal.  - clonidine, carvedilol, fursoemide, isosorbide mononitrate   LOS: 5 Taylor [name] 12/6/201610:45 AM

## 2015-10-15 NOTE — Care Management (Addendum)
patient is now appealing his discharge because "my brother does not want me to go home with my diarrhea.   It will be too hard on my father."  Discussed he could seek placement and declined. he has declined home health services.  Discussed that his brother not wanting him to return home does not meet medical necessity but it is his right to make an appeal.  Instructed patient to call [facility] to initiate appeal.  He says that he does not have to do that right now- that he called a few days ago.  Instructed patient to call the agency immediately.  Discussed the trend of appealing all discharges.  Discussed on future admissions, [facility]  would reach out to [facility] prior to anticipated discharge to receive determination of whether continued stay is needed and communicate determination prior to day of discharge.  Informed by primary nurse that patient declined to complete  his full hemodialysis treatment today.  He asked to have if discontinued after 2 hours.

## 2015-10-15 NOTE — Progress Notes (Signed)
HD tx start 

## 2015-10-16 LAB — GLUCOSE, CAPILLARY
GLUCOSE-CAPILLARY: 296 mg/dL — AB (ref 65–99)
GLUCOSE-CAPILLARY: 277 mg/dL — AB (ref 65–99)
GLUCOSE-CAPILLARY: 235 mg/dL — AB (ref 65–99)
GLUCOSE-CAPILLARY: 213 mg/dL — AB (ref 65–99)
Glucose-Capillary: 252 mg/dL — ABNORMAL HIGH (ref 65–99)

## 2015-10-16 LAB — HEPATITIS B SURFACE ANTIGEN: Hepatitis B Surface Ag: NEGATIVE

## 2015-10-16 NOTE — Care Management (Signed)
Appeal has been received by Asa Lente and review is in progress

## 2015-10-16 NOTE — Progress Notes (Signed)
[facility]  ROUNDING NOTE   Subjective:   Hemodialysis yesterday. Patient had only 2 hours of treatment before asking to come off.  UF 2 litres.   Objective:  [name] signs in last 24 hours:  Temp:  [97.9 F (36.6 C)-98.6 F (37 C)] 97.9 F (36.6 C) (12/07 0520) Pulse Rate:  [77-86] 78 (12/07 0520) Resp:  [16-22] 22 (12/07 0520) BP: (124-152)/(53-79) 128/64 mmHg (12/07 0520) SpO2:  [100 %] 100 % (12/07 0520) Weight:  [81.5 kg (179 lb 10.8 oz)-82.4 kg (181 lb 10.5 oz)] 81.5 kg (179 lb 10.8 oz) (12/06 1230)  Weight change:  Filed Weights   10/13/15 1430 10/15/15 0930 10/15/15 1230  Weight: 81.3 kg (179 lb 3.7 oz) 82.4 kg (181 lb 10.5 oz) 81.5 kg (179 lb 10.8 oz)    Intake/Output: I/O last 3 completed shifts: In: 360 [P.O.:360] Out: 3775 [Urine:1775; Other:2000]   Intake/Output this shift:     Physical Exam: General: NAD, laying bed  Head: Normocephalic, atraumatic. Moist oral mucosal membranes  Eyes: Anicteric  Neck: Supple, trachea midline  Lungs:  clear, normal effort   Heart: Regular rate and rhythm  Abdomen:  Soft, nontender, BS present , obese  Extremities: 1+ peripheral edema. Bilateral toe amputations  Neurologic: Nonfocal, moving all four extremities  Skin: No lesions  Access: LUE AVF    Basic Metabolic Panel:  Recent Labs Lab 10/10/15 1418 10/11/15 0358 10/11/15 1014 10/13/15 1347 10/15/15 0439  NA 134* 134*  --  127* 128*  K 3.6 3.5  --  5.9* 5.1  CL 92* 94*  --  90* 90*  CO2 31 30  --  27 29  GLUCOSE 309* 272*  --  155* 188*  BUN 19 25*  --  43* 51*  CREATININE 2.41* 2.78*  --  3.53* 3.68*  CALCIUM 8.4* 8.7*  --  8.1* 8.2*  PHOS  --   --  3.6 4.0  --     Liver Function Tests:  Recent Labs Lab 10/13/15 1347  ALBUMIN 2.4*   No results for input(s): LIPASE, AMYLASE in the last 168 hours. No results for input(s): AMMONIA in the last 168 hours.  CBC:  Recent Labs Lab 10/10/15 1418 10/11/15 0358 10/13/15 1346  10/15/15 0439  WBC 8.2 5.8 7.0 6.8  NEUTROABS 6.9*  --   --   --   HGB 10.0* 9.4* 9.3* 8.6*  HCT 31.9* 30.2* 29.7* 26.9*  MCV 91.0 91.4 91.8 90.8  PLT 106* 111* 117* 113*    Cardiac Enzymes: No results for input(s): CKTOTAL, CKMB, CKMBINDEX, TROPONINI in the last 168 hours.  BNP: Invalid input(s): POCBNP  CBG:  Recent Labs Lab 10/15/15 1151 10/15/15 1701 10/15/15 2325 10/16/15 0447 10/16/15 0751  GLUCAP 109* 186* 365* 296* 277*    Microbiology: Results for orders placed or performed during the hospital encounter of 10/10/15  MRSA PCR Screening     Status: None   Collection Time: 10/10/15  6:02 PM  Result Value Ref Range Status   MRSA by PCR NEGATIVE NEGATIVE Final    Comment:        The GeneXpert MRSA Assay (FDA approved for NASAL specimens only), is one component of a comprehensive MRSA colonization surveillance program. It is not intended to diagnose MRSA infection nor to guide or monitor treatment for MRSA infections.   C difficile quick scan w PCR reflex     Status: Abnormal   Collection Time: 10/12/15 12:50 PM  Result Value Ref Range Status  C Diff antigen POSITIVE (A) NEGATIVE Final   C Diff toxin NEGATIVE NEGATIVE Final   C Diff interpretation   Final    Positive for toxigenic C. difficile, active toxin production not detected. Patient has toxigenic C. difficile organisms present in the bowel, but toxin was not detected. The patient may be a carrier or the level of toxin in the sample was below the limit  of detection. This information should be used in conjunction with the patient's clinical history when deciding on possible therapy.   Clostridium Difficile by PCR     Status: Abnormal   Collection Time: 10/12/15 12:50 PM  Result Value Ref Range Status   Toxigenic C Difficile by pcr POSITIVE (A) NEGATIVE Final    Comment: CRITICAL RESULT CALLED TO, READ BACK BY AND VERIFIED WITH: Dorita Fray, RN 10/12/2015 1454 BY JRS.     Coagulation  Studies: No results for input(s): LABPROT, INR in the last 72 hours.  Urinalysis: No results for input(s): COLORURINE, LABSPEC, PHURINE, GLUCOSEU, HGBUR, BILIRUBINUR, KETONESUR, PROTEINUR, UROBILINOGEN, NITRITE, LEUKOCYTESUR in the last 72 hours.  Invalid input(s): APPERANCEUR    Imaging: No results found.   Medications:     . amitriptyline  25 mg Oral QHS  . antiseptic oral rinse  7 mL Mouth Rinse BID  . aspirin  325 mg Oral Daily  . carvedilol  25 mg Oral BID WC  . cloNIDine  0.1 mg Oral BID  . clopidogrel  75 mg Oral Daily  . epoetin (EPOGEN/PROCRIT) injection  10,000 Units Intravenous Q T,Th,Sa-HD  . furosemide  80 mg Oral BID  . heparin subcutaneous  5,000 Units Subcutaneous Q12H  . insulin aspart  0-5 Units Subcutaneous QHS  . insulin aspart  0-9 Units Subcutaneous TID WC  . insulin detemir  20 Units Subcutaneous QHS  . isosorbide dinitrate  30 mg Oral Daily  . levothyroxine  75 mcg Oral QAC breakfast  . metroNIDAZOLE  500 mg Oral 3 times per day  . omega-3 acid ethyl esters  1 g Oral Daily  . pantoprazole  40 mg Oral Daily  . pregabalin  75 mg Oral BID  . rosuvastatin  20 mg Oral QHS  . sevelamer carbonate  1,600 mg Oral TID WC   sodium chloride, sodium chloride, acetaminophen, alteplase, alum & mag hydroxide-simeth, benzonatate, heparin, lidocaine (PF), lidocaine-prilocaine, nitroGLYCERIN, ondansetron, pentafluoroprop-tetrafluoroeth, promethazine  Assessment/ Plan:  44 y.o. 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  End-stage renal disease on hemodialysis Tuesday, Thursday, Saturday.   - tolerated dialysis yesterday but cut his prescribed treatment short. This is not  advisable and explained to patient. Currently with peripheral edema.  - Continue TTS schedule. Needs to comply with prescribed hemodialysis treatment  2.  Anemia chronic kidney disease. Hemoglobin 8.6. With thrombocytopenia. Platelets 113 - Pt will continue Epogen with dialysis.  3.  Secondary hyperparathyroidism.   Phos 4, PTH 107 - Continue Renvela 1600 mg by mouth 3 times a day with meals - discontinued cinacalcet.   4.   C. Difficile colitis.   -  Metronidazole.  5. Hypertension: blood pressure at goal.  - clonidine, carvedilol, fursoemide, isosorbide mononitrate   LOS: 6 Taylor [name] 12/7/20169:07 AM

## 2015-10-16 NOTE — Care Management (Signed)
Follow up with [facility]- record is still in review.  Updated attending, primary nurse and director.  Would not anticipate a determine today this late in the evening. Discussed that if discharge upheld- patient has until noon following the day that determination is received to leave the hospital.  have instructed not to discontinue the discharge order.

## 2015-10-16 NOTE — Discharge Summary (Signed)
[facility] [place] [facility]   PATIENT NAME: Taylor [name]    MR#:  [id number]  DATE OF BIRTH:  02-05-1971  DATE OF ADMISSION:  10/10/2015 ADMITTING PHYSICIAN: Katha Hamming, MD  DATE OF DISCHARGE: 10/16/2015  PRIMARY CARE PHYSICIAN: [name], MD    ADMISSION DIAGNOSIS:  Shortness of breath [R06.02] Acute pulmonary edema (HCC) [J81.0]  DISCHARGE DIAGNOSIS:  Principal Problem:   Acute on chronic congestive heart failure with left ventricular diastolic dysfunction (HCC) Active Problems:   SOB (shortness of breath)   Pressure ulcer   Clostridium difficile colitis   SECONDARY DIAGNOSIS:   Past Medical History  Diagnosis Date  . End stage renal disease on dialysis (HCC)     LUE fistula  . Type I diabetes mellitus (HCC)     a. 03/2014 admitted with HNK to [facility]. b. TTS  . Diabetic neuropathy (HCC)     severe, s/p multiple toe amputation  . Hypothyroidism   . Hypertension   . Hyperlipidemia   . H/O hiatal hernia   . GERD (gastroesophageal reflux disease)   . Anxiety   . Sebaceous cyst     side of neck  . Pneumonia     2010  . Anemia     a. req PRBC's 2011.  [name] PAD (peripheral artery disease) (HCC)     a. s/p amputation of toes on the right;  b. left LE claudication.  . Coronary artery disease     a. s/p MI;  b. 10/2009 CABG x 3 @ [facility]: LIMA->LAD, VG->OM3, VG->RPDA; c. 11/2010 Cath 3/3 patent grafts;  d 12/2012 Cath: LM 30d, LAD 85p, D1 70, D2 90, LCX 40ost, OM2 100, RCA 90p, 141m, L->LAD ok, VG->OM3 ok, VG->RPDA 30, EF 50%->Med Rx.  . Cataract     right  . Valvular disease     a. 11/2012 Echo: EF 55-60%, mild LVH, mild MR, mild bi-atrial enlargement, mild-mod TR, PASP ; b. echo 03/2014: EF 50-55%, nl WM, select images concerning for bicuspid aortic valve w/ nl thicknes of leaflets, mild MR, mild bi-atrial dilatation, RV mild dilatation - wall thickness nl, mod TR - select images appears to be mod to sev, PASP at least mod  elevated.  . Diastolic CHF (HCC)     see echo above  . Depression   . Arthritis     rheumatoid arthritis   . Myocardial infarction (HCC) 2010  . Pulmonary HTN (HCC)     a. continuation of 03/2014 echo PASP @ least mod elevated. RVSP 52 mm Hg. Parasternal long axis estimated @ 85 mm Hg  . Scrotal abscess     a. s/p multiple I&D  . Penile abscess     a. s/p multiple I&D  . Hematemesis     a. EGD 2016 with LA Grade C esophagitis, continued on Protonix   . Chronic respiratory failure (HCC)     a. on 3L oxygen via nasal cannula  . Anxiety   . Acute delirium   . Sepsis (HCC)   . 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 MYOCARDIAL INFARCTION, HX OF 01/26/2011    Qualifier: Diagnosis of  By: [name] MD, [name]    . HTN (hypertension) 02/11/2011  . Pulmonary hypertension associated with end stage renal disease on dialysis (HCC) 04/16/2014  . Hyperkalemia   . Hypothyroidism   . Osteomyelitis [facility])     HOSPITAL COURSE:   Zackari Ruane is a 44 y.o. male with a known history of htn, diabetes  mellitus type 2, ESRD on hemodialysis comes in because of shortness of breath.   #1 Congestive heart failure diastolic acute on chronic- improved with dialysis - on chronic home oxygen. 3L -x-ray congestive heart failure,no findings of pneumonia. - improved with dialysis, sats are better -Antibiotics discontinued.   #2 end-stage renal disease on hemodialysis with ultrafiltration Sats stable 100% on 3 L.  -Appreciate nephrology consult. -dialysis yesterday- cut short by patient for no reason- - next dialysis tomorrow per schedule  #3 diabetes mellitus type 2: continue novolog and Levemir. - Appreciate diabetes coordinator input.  #4 C. difficile colitis-diarrhea is improved.  -Continue Flagyl for now. Added probiotics as well   #5 hypertension controlled continue clonidine, Coreg, Isordil, Lasix.  #6 history of left carotid stenosis: Recently started on Plavix 3 weeks ago. Continue  that.  Patient will be discharged today if appeal reviewed  DISCHARGE CONDITIONS:   Guarded with poor long term prognosis  CONSULTS OBTAINED:  Treatment Team:  Mady Haagensen, MD Enedina Finner, MD  DRUG ALLERGIES:   Allergies  Allergen Reactions  . Amoxicillin-Pot Clavulanate Nausea And Vomiting and Itching  . Rifampin Nausea And Vomiting  . Tape Other (See Comments)    DISCHARGE MEDICATIONS:   Current Discharge Medication List    START taking these medications   Details  metroNIDAZOLE (FLAGYL) 500 MG tablet Take 1 tablet (500 mg total) by mouth every 8 (eight) hours. Qty: 24 tablet, Refills: 0    Probiotic CAPS Take 1 capsule by mouth daily. Qty: 10 capsule, Refills: 0      CONTINUE these medications which have NOT CHANGED   Details  acetaminophen (TYLENOL) 500 MG tablet Take 1,000 mg by mouth every 6 (six) hours as needed for mild pain or headache.     alum & mag hydroxide-simeth (MAALOX PLUS) 400-400-40 MG/5ML suspension Take 30 mLs by mouth every 4 (four) hours as needed for indigestion.     amitriptyline (ELAVIL) 25 MG tablet Take 1 tablet (25 mg total) by mouth at bedtime. Qty: 30 tablet, Refills: 1    aspirin 325 MG EC tablet Take 325 mg by mouth daily.    carvedilol (COREG) 25 MG tablet Take 1 tablet (25 mg total) by mouth 2 (two) times daily with a meal. Qty: 180 tablet, Refills: 1    cinacalcet (SENSIPAR) 30 MG tablet Take 30 mg by mouth daily.    cloNIDine (CATAPRES) 0.1 MG tablet Take 0.1 mg by mouth 2 (two) times daily.    clopidogrel (PLAVIX) 75 MG tablet Take 1 tablet (75 mg total) by mouth daily. Qty: 90 tablet, Refills: 3    DHA-EPA-VITAMIN E PO Take 1 capsule by mouth daily.    furosemide (LASIX) 80 MG tablet Take 80 mg by mouth 2 (two) times daily.    !! insulin aspart (NOVOLOG) 100 UNIT/ML injection Inject 12 Units into the skin 3 (three) times daily before meals.    insulin detemir (LEVEMIR) 100 UNIT/ML injection Inject 0.14 mLs (14  Units total) into the skin at bedtime. Qty: 10 mL, Refills: 11    isosorbide dinitrate (ISORDIL) 30 MG tablet Take 30 mg by mouth daily.    levothyroxine (SYNTHROID, LEVOTHROID) 75 MCG tablet Take 1 tablet (75 mcg total) by mouth daily before breakfast. Qty: 90 tablet, Refills: 1    nitroGLYCERIN (NITROSTAT) 0.4 MG SL tablet Place 0.4 mg under the tongue every 5 (five) minutes as needed for chest pain.    Omega-3 Fatty Acids (FISH OIL) 1000 MG CAPS Take 1  capsule by mouth daily.    pantoprazole (PROTONIX) 40 MG tablet Take 40 mg by mouth 2 (two) times daily.    pregabalin (LYRICA) 75 MG capsule Take 75 mg by mouth 2 (two) times daily.     rosuvastatin (CRESTOR) 20 MG tablet Take 1 tablet (20 mg total) by mouth at bedtime. Qty: 30 tablet, Refills: 5    sevelamer carbonate (RENVELA) 800 MG tablet Take 1,600 mg by mouth 3 (three) times daily with meals.    !! insulin aspart (NOVOLOG) 100 UNIT/ML injection Inject 0-7 Units into the skin 4 (four) times daily -  before meals and at bedtime. Qty: 10 mL, Refills: 11     !! - Potential duplicate medications found. Please discuss with provider.    STOP taking these medications     levofloxacin (LEVAQUIN) 500 MG tablet      omeprazole (PRILOSEC) 40 MG capsule      ondansetron (ZOFRAN ODT) 4 MG disintegrating tablet      promethazine (PHENERGAN) 25 MG tablet          DISCHARGE INSTRUCTIONS:   1. PCP f/u in 1-2 weeks 2. F/u at CHF clinic as scheduled 3. For dialysis tomorrow as per schedule  If you experience worsening of your admission symptoms, develop shortness of breath, life threatening emergency, suicidal or homicidal thoughts you must seek medical attention immediately by calling 911 or calling your MD immediately  if symptoms less severe.  You Must read complete instructions/literature along with all the possible adverse reactions/side effects for all the Medicines you take and that have been prescribed to you. Take any new  Medicines after you have completely understood and accept all the possible adverse reactions/side effects.   Please note  You were cared for by a hospitalist during your hospital stay. If you have any questions about your discharge medications or the care you received while you were in the hospital after you are discharged, you can call the unit and asked to speak with the hospitalist on call if the hospitalist that took care of you is not available. Once you are discharged, your primary care physician will handle any further medical issues. Please note that NO REFILLS for any discharge medications will be authorized once you are discharged, as it is imperative that you return to your primary care physician (or establish a relationship with a primary care physician if you do not have one) for your aftercare needs so that they can reassess your need for medications and monitor your lab values.    Today   CHIEF COMPLAINT:   Chief Complaint  Patient presents with  . Shortness of Breath  . Cough    VITAL SIGNS:  Blood pressure 128/64, pulse 78, temperature 97.9 F (36.6 C), temperature source Oral, resp. rate 22, height  (1.905 m), weight 81.5 kg (179 lb 10.8 oz), SpO2 100 %.  I/O:    Intake/Output Summary (Last 24 hours) at 10/16/15 0956 Last data filed at 10/16/15 0520  Gross per 24 hour  Intake    240 ml  Output   3250 ml  Net  -3010 ml    PHYSICAL EXAMINATION:   Physical Exam  GENERAL:  44 y.o.-year-old patient lying in the bed with no acute distress.  EYES: Pupils equal, round, reactive to light and accommodation. No scleral icterus. Extraocular muscles intact.  HEENT: Head atraumatic, normocephalic. Oropharynx and nasopharynx clear.  NECK:  Supple, no jugular venous distention. No thyroid enlargement, no tenderness.  LUNGS: Normal  breath sounds bilaterally, no wheezing, rales,rhonchi or crepitation. No use of accessory muscles of respiration. Decreased bibasilar breath  sounds CARDIOVASCULAR: S1, S2 normal. No rubs, or gallops. 3/6 systolic murmur is present ABDOMEN: Abdomen is distended, but soft and nontender. Bowel sounds present. No organomegaly or mass.  EXTREMITIES: No pedal edema, no cyanosis, or clubbing. Status post right transmetatarsal amputation and also amputation of left toes NEUROLOGIC: Cranial nerves II through XII are intact. No new focal motor deficits. Sensation intact. Gait is not checked.  PSYCHIATRIC: The patient is alert and oriented x 3.  SKIN: No obvious rash, lesion, or ulcer.   DATA REVIEW:   CBC  Recent Labs Lab 10/15/15 0439  WBC 6.8  HGB 8.6*  HCT 26.9*  PLT 113*    Chemistries   Recent Labs Lab 10/15/15 0439  NA 128*  K 5.1  CL 90*  CO2 29  GLUCOSE 188*  BUN 51*  CREATININE 3.68*  CALCIUM 8.2*    Cardiac Enzymes No results for input(s): TROPONINI in the last 168 hours.  Microbiology Results  Results for orders placed or performed during the hospital encounter of 10/10/15  MRSA PCR Screening     Status: None   Collection Time: 10/10/15  6:02 PM  Result Value Ref Range Status   MRSA by PCR NEGATIVE NEGATIVE Final    Comment:        The GeneXpert MRSA Assay (FDA approved for NASAL specimens only), is one component of a comprehensive MRSA colonization surveillance program. It is not intended to diagnose MRSA infection nor to guide or monitor treatment for MRSA infections.   C difficile quick scan w PCR reflex     Status: Abnormal   Collection Time: 10/12/15 12:50 PM  Result Value Ref Range Status   C Diff antigen POSITIVE (A) NEGATIVE Final   C Diff toxin NEGATIVE NEGATIVE Final   C Diff interpretation   Final    Positive for toxigenic C. difficile, active toxin production not detected. Patient has toxigenic C. difficile organisms present in the bowel, but toxin was not detected. The patient may be a carrier or the level of toxin in the sample was below the limit  of detection. This  information should be used in conjunction with the patient's clinical history when deciding on possible therapy.   Clostridium Difficile by PCR     Status: Abnormal   Collection Time: 10/12/15 12:50 PM  Result Value Ref Range Status   Toxigenic C Difficile by pcr POSITIVE (A) NEGATIVE Final    Comment: CRITICAL RESULT CALLED TO, READ BACK BY AND VERIFIED WITH: Dorita Fray, RN 10/12/2015 1454 BY JRS.     RADIOLOGY:  No results found.  EKG:   Orders placed or performed during the hospital encounter of 10/10/15  . ED EKG  . ED EKG      Management plans discussed with the patient, family and they are in agreement.  CODE STATUS:     Code Status Orders        Start     Ordered   10/10/15 1510  Full code   Continuous     10/10/15 1527      TOTAL TIME TAKING CARE OF THIS PATIENT: 37 minutes.    Enid Baas M.D on 10/16/2015 at 9:56 AM  Between 7am to 6pm - Pager - [phone]  After 6pm go to [email or web address] - password [email or web address] [facility]  [facility]  Office  [phone]  CC: Primary care physician; [name], MD

## 2015-10-16 NOTE — Progress Notes (Signed)
Patient appealed his discharge again yesterday Review in progress  Remains medically stable.

## 2015-10-17 LAB — GLUCOSE, CAPILLARY
GLUCOSE-CAPILLARY: 97 mg/dL (ref 65–99)
Glucose-Capillary: 90 mg/dL (ref 65–99)
Glucose-Capillary: 148 mg/dL — ABNORMAL HIGH (ref 65–99)

## 2015-10-17 LAB — CBC
HEMATOCRIT: 26.3 % — AB (ref 40.0–52.0)
HEMOGLOBIN: 8.2 g/dL — AB (ref 13.0–18.0)
MCH: 28.3 pg (ref 26.0–34.0)
MCHC: 31.2 g/dL — AB (ref 32.0–36.0)
MCV: 90.8 fL (ref 80.0–100.0)
Platelets: 114 10*3/uL — ABNORMAL LOW (ref 150–440)
RBC: 2.89 MIL/uL — AB (ref 4.40–5.90)
RDW: 15.4 % — ABNORMAL HIGH (ref 11.5–14.5)
WBC: 8.5 10*3/uL (ref 3.8–10.6)

## 2015-10-17 LAB — CREATININE, SERUM
CREATININE: 4.13 mg/dL — AB (ref 0.61–1.24)
GFR calc Af Amer: 18 mL/min — ABNORMAL LOW (ref >60)
GFR calc non Af Amer: 16 mL/min — ABNORMAL LOW (ref >60)

## 2015-10-17 LAB — RENAL FUNCTION PANEL
ALBUMIN: 2.1 g/dL — AB (ref 3.5–5.0)
ANION GAP: 11 (ref 5–15)
BUN: 61 mg/dL — ABNORMAL HIGH (ref 6–20)
CO2: 27 mmol/L (ref 22–32)
Calcium: 8.8 mg/dL — ABNORMAL LOW (ref 8.9–10.3)
Chloride: 91 mmol/L — ABNORMAL LOW (ref 101–111)
Creatinine, Ser: 3.96 mg/dL — ABNORMAL HIGH (ref 0.61–1.24)
GFR calc Af Amer: 19 mL/min — ABNORMAL LOW (ref >60)
GFR calc non Af Amer: 17 mL/min — ABNORMAL LOW (ref >60)
GLUCOSE: 109 mg/dL — AB (ref 65–99)
PHOSPHORUS: 4.6 mg/dL (ref 2.5–4.6)
POTASSIUM: 5.8 mmol/L — AB (ref 3.5–5.1)
Sodium: 129 mmol/L — ABNORMAL LOW (ref 135–145)

## 2015-10-17 NOTE — Progress Notes (Signed)
Post hd tx 

## 2015-10-17 NOTE — Progress Notes (Signed)
Inpatient Diabetes Program Recommendations  AACE/ADA: New Consensus Statement on Inpatient Glycemic Control (2015)  Target Ranges:  Prepandial:   less than 140 mg/dL      Peak postprandial:   less than 180 mg/dL (1-2 hours)      Critically ill patients:  140 - 180 mg/dL  Results for NOSSON, WENDER (MRN [id number]) as of 10/17/2015 09:39  Ref. Range 10/16/2015 07:51 10/16/2015 12:30 10/16/2015 17:05 10/16/2015 21:47 10/17/2015 07:56  Glucose-Capillary Latest Ref Range: 65-99 mg/dL 357 (H)  5 units 017 (H)  5 units 235 (H)  3 units 213 (H)  2 units 90   Review of Glycemic Control Diabetes history: DM1 Outpatient Diabetes medications: Levemir 14 units QHS, Novolog 12 units TID with meals Current orders for Inpatient glycemic control: Levemir 20 units QHS, Novolog 0-9 units TID with meals, Novolog 0-5 units HS  Inpatient Diabetes Program Recommendations: Insulin - Meal Coverage: If patient is not discharged as planned, please consider ordering Novolog 4 units TID with meals for meal coverage (in addition to Novolog correction scale).  Thanks, Orlando Penner, RN, MSN, CDE Diabetes Coordinator Inpatient Diabetes Program [phone] (Team Pager from 8am to 5pm) [phone] ([facility] office) [phone] [facility] office) [phone] [facility] office)

## 2015-10-17 NOTE — Progress Notes (Signed)
HD tx started  

## 2015-10-17 NOTE — Care Management (Signed)
Keppro upheld discharge.  Patient is aware and verbalizes understanding that as of noon today he will assume financial responsibility for any charges incurred at that time.  Patient will receive his usual dialysis today.  States if he does not receive it here- he will "skip it" for today which puts him at risk for immediate readmission

## 2015-10-17 NOTE — Discharge Summary (Signed)
[facility] - [place] at [facility]   PATIENT NAME: Taylor [name]    MR#:  [id number]  DATE OF BIRTH:  12/19/1970  DATE OF ADMISSION:  10/10/2015 ADMITTING PHYSICIAN: Katha Hamming, MD  DATE OF DISCHARGE: 10/17/2015  PRIMARY CARE PHYSICIAN: [name], MD    ADMISSION DIAGNOSIS:  Shortness of breath [R06.02] Acute pulmonary edema (HCC) [J81.0]  DISCHARGE DIAGNOSIS:  Principal Problem:   Acute on chronic congestive heart failure with left ventricular diastolic dysfunction (HCC) Active Problems:   SOB (shortness of breath)   Pressure ulcer   Clostridium difficile colitis   SECONDARY DIAGNOSIS:   Past Medical History  Diagnosis Date  . End stage renal disease on dialysis (HCC)     LUE fistula  . Type I diabetes mellitus (HCC)     a. 03/2014 admitted with HNK to [facility]. b. TTS  . Diabetic neuropathy (HCC)     severe, s/p multiple toe amputation  . Hypothyroidism   . Hypertension   . Hyperlipidemia   . H/O hiatal hernia   . GERD (gastroesophageal reflux disease)   . Anxiety   . Sebaceous cyst     side of neck  . Pneumonia     2010  . Anemia     a. req PRBC's 2011.  [name] PAD (peripheral artery disease) (HCC)     a. s/p amputation of toes on the right;  b. left LE claudication.  . Coronary artery disease     a. s/p MI;  b. 10/2009 CABG x 3 @ [facility]: LIMA->LAD, VG->OM3, VG->RPDA; c. 11/2010 Cath 3/3 patent grafts;  d 12/2012 Cath: LM 30d, LAD 85p, D1 70, D2 90, LCX 40ost, OM2 100, RCA 90p, 118m, L->LAD ok, VG->OM3 ok, VG->RPDA 30, EF 50%->Med Rx.  . Cataract     right  . Valvular disease     a. 11/2012 Echo: EF 55-60%, mild LVH, mild MR, mild bi-atrial enlargement, mild-mod TR, PASP ; b. echo 03/2014: EF 50-55%, nl WM, select images concerning for bicuspid aortic valve w/ nl thicknes of leaflets, mild MR, mild bi-atrial dilatation, RV mild dilatation - wall thickness nl, mod TR - select images appears to be mod to sev, PASP at least mod  elevated.  . Diastolic CHF (HCC)     see echo above  . Depression   . Arthritis     rheumatoid arthritis   . Myocardial infarction (HCC) 2010  . Pulmonary HTN (HCC)     a. continuation of 03/2014 echo PASP @ least mod elevated. RVSP 52 mm Hg. Parasternal long axis estimated @ 85 mm Hg  . Scrotal abscess     a. s/p multiple I&D  . Penile abscess     a. s/p multiple I&D  . Hematemesis     a. EGD 2016 with LA Grade C esophagitis, continued on Protonix   . Chronic respiratory failure (HCC)     a. on 3L oxygen via nasal cannula  . Anxiety   . Acute delirium   . Sepsis (HCC)   . 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 MYOCARDIAL INFARCTION, HX OF 01/26/2011    Qualifier: Diagnosis of  By: [name] MD, [name]    . HTN (hypertension) 02/11/2011  . Pulmonary hypertension associated with end stage renal disease on dialysis (HCC) 04/16/2014  . Hyperkalemia   . Hypothyroidism   . Osteomyelitis [facility])     HOSPITAL COURSE:   Guido Comp is a 44 y.o. male with a known history of htn, diabetes  mellitus type 2, ESRD on hemodialysis comes in because of shortness of breath.   #1 Congestive heart failure diastolic acute on chronic- improved with dialysis - on chronic home oxygen. 3L -x-ray congestive heart failure,no findings of pneumonia. - improved with dialysis, sats are better -Antibiotics discontinued.   #2 end-stage renal disease on hemodialysis with ultrafiltration Sats stable 100% on 3 L.  -Appreciate nephrology consult. -Dialysis today per schedule prior to discharge.  #3 diabetes mellitus type 2: continue novolog and Levemir. - Appreciate diabetes coordinator input.  #4 C. difficile colitis-diarrhea is improved.  -Continue Flagyl for now. Added probiotics as well   #5 hypertension controlled continue clonidine, Coreg, Isordil, Lasix.  #6 history of left carotid stenosis: Recently started on Plavix 3 weeks ago. Continue that.  Patient will be discharged today after  dialysis.  DISCHARGE CONDITIONS:   Guarded with poor long term prognosis  CONSULTS OBTAINED:  Treatment Team:  Mady Haagensen, MD Enedina Finner, MD  DRUG ALLERGIES:   Allergies  Allergen Reactions  . Amoxicillin-Pot Clavulanate Nausea And Vomiting and Itching  . Rifampin Nausea And Vomiting  . Tape Other (See Comments)    DISCHARGE MEDICATIONS:   Current Discharge Medication List    START taking these medications   Details  metroNIDAZOLE (FLAGYL) 500 MG tablet Take 1 tablet (500 mg total) by mouth every 8 (eight) hours. Qty: 24 tablet, Refills: 0    Probiotic CAPS Take 1 capsule by mouth daily. Qty: 10 capsule, Refills: 0      CONTINUE these medications which have NOT CHANGED   Details  acetaminophen (TYLENOL) 500 MG tablet Take 1,000 mg by mouth every 6 (six) hours as needed for mild pain or headache.     alum & mag hydroxide-simeth (MAALOX PLUS) 400-400-40 MG/5ML suspension Take 30 mLs by mouth every 4 (four) hours as needed for indigestion.     amitriptyline (ELAVIL) 25 MG tablet Take 1 tablet (25 mg total) by mouth at bedtime. Qty: 30 tablet, Refills: 1    aspirin 325 MG EC tablet Take 325 mg by mouth daily.    carvedilol (COREG) 25 MG tablet Take 1 tablet (25 mg total) by mouth 2 (two) times daily with a meal. Qty: 180 tablet, Refills: 1    cinacalcet (SENSIPAR) 30 MG tablet Take 30 mg by mouth daily.    cloNIDine (CATAPRES) 0.1 MG tablet Take 0.1 mg by mouth 2 (two) times daily.    clopidogrel (PLAVIX) 75 MG tablet Take 1 tablet (75 mg total) by mouth daily. Qty: 90 tablet, Refills: 3    DHA-EPA-VITAMIN E PO Take 1 capsule by mouth daily.    furosemide (LASIX) 80 MG tablet Take 80 mg by mouth 2 (two) times daily.    !! insulin aspart (NOVOLOG) 100 UNIT/ML injection Inject 12 Units into the skin 3 (three) times daily before meals.    insulin detemir (LEVEMIR) 100 UNIT/ML injection Inject 0.14 mLs (14 Units total) into the skin at bedtime. Qty: 10 mL,  Refills: 11    isosorbide dinitrate (ISORDIL) 30 MG tablet Take 30 mg by mouth daily.    levothyroxine (SYNTHROID, LEVOTHROID) 75 MCG tablet Take 1 tablet (75 mcg total) by mouth daily before breakfast. Qty: 90 tablet, Refills: 1    nitroGLYCERIN (NITROSTAT) 0.4 MG SL tablet Place 0.4 mg under the tongue every 5 (five) minutes as needed for chest pain.    Omega-3 Fatty Acids (FISH OIL) 1000 MG CAPS Take 1 capsule by mouth daily.    pantoprazole (PROTONIX)  40 MG tablet Take 40 mg by mouth 2 (two) times daily.    pregabalin (LYRICA) 75 MG capsule Take 75 mg by mouth 2 (two) times daily.     rosuvastatin (CRESTOR) 20 MG tablet Take 1 tablet (20 mg total) by mouth at bedtime. Qty: 30 tablet, Refills: 5    sevelamer carbonate (RENVELA) 800 MG tablet Take 1,600 mg by mouth 3 (three) times daily with meals.    !! insulin aspart (NOVOLOG) 100 UNIT/ML injection Inject 0-7 Units into the skin 4 (four) times daily -  before meals and at bedtime. Qty: 10 mL, Refills: 11     !! - Potential duplicate medications found. Please discuss with provider.    STOP taking these medications     levofloxacin (LEVAQUIN) 500 MG tablet      omeprazole (PRILOSEC) 40 MG capsule      ondansetron (ZOFRAN ODT) 4 MG disintegrating tablet      promethazine (PHENERGAN) 25 MG tablet          DISCHARGE INSTRUCTIONS:   1. PCP f/u in 1-2 weeks 2. F/u at CHF clinic as scheduled 3. For dialysis in 2 days as per schedule  If you experience worsening of your admission symptoms, develop shortness of breath, life threatening emergency, suicidal or homicidal thoughts you must seek medical attention immediately by calling 911 or calling your MD immediately  if symptoms less severe.  You Must read complete instructions/literature along with all the possible adverse reactions/side effects for all the Medicines you take and that have been prescribed to you. Take any new Medicines after you have completely understood and  accept all the possible adverse reactions/side effects.   Please note  You were cared for by a hospitalist during your hospital stay. If you have any questions about your discharge medications or the care you received while you were in the hospital after you are discharged, you can call the unit and asked to speak with the hospitalist on call if the hospitalist that took care of you is not available. Once you are discharged, your primary care physician will handle any further medical issues. Please note that NO REFILLS for any discharge medications will be authorized once you are discharged, as it is imperative that you return to your primary care physician (or establish a relationship with a primary care physician if you do not have one) for your aftercare needs so that they can reassess your need for medications and monitor your lab values.    Today   CHIEF COMPLAINT:   Chief Complaint  Patient presents with  . Shortness of Breath  . Cough    VITAL SIGNS:  Blood pressure 126/64, pulse 81, temperature 98 F (36.7 C), temperature source Oral, resp. rate 18, height 6\' 3"  (1.905 m), weight 81.5 kg (179 lb 10.8 oz), SpO2 97 %.  I/O:    Intake/Output Summary (Last 24 hours) at 10/17/15 1014 Last data filed at 10/17/15 0429  Gross per 24 hour  Intake    200 ml  Output    950 ml  Net   -750 ml    PHYSICAL EXAMINATION:   Physical Exam  GENERAL:  44 y.o.-year-old patient lying in the bed with no acute distress.  EYES: Pupils equal, round, reactive to light and accommodation. No scleral icterus. Extraocular muscles intact.  HEENT: Head atraumatic, normocephalic. Oropharynx and nasopharynx clear.  NECK:  Supple, no jugular venous distention. No thyroid enlargement, no tenderness.  LUNGS: Normal breath sounds bilaterally, no wheezing,  rales,rhonchi or crepitation. No use of accessory muscles of respiration. Decreased bibasilar breath sounds CARDIOVASCULAR: S1, S2 normal. No rubs, or  gallops. 3/6 systolic murmur is present ABDOMEN: Abdomen is distended, but soft and nontender. Bowel sounds present. No organomegaly or mass.  EXTREMITIES: No pedal edema, no cyanosis, or clubbing. Status post right transmetatarsal amputation and also amputation of left toes NEUROLOGIC: Cranial nerves II through XII are intact. No new focal motor deficits. Sensation intact. Gait is not checked.  PSYCHIATRIC: The patient is alert and oriented x 3.  SKIN: No obvious rash, lesion, or ulcer.   DATA REVIEW:   CBC  Recent Labs Lab 10/17/15 0657  WBC 8.5  HGB 8.2*  HCT 26.3*  PLT 114*    Chemistries   Recent Labs Lab 10/17/15 0657  NA 129*  K 5.8*  CL 91*  CO2 27  GLUCOSE 109*  BUN 61*  CREATININE 3.96*  4.13*  CALCIUM 8.8*    Cardiac Enzymes No results for input(s): TROPONINI in the last 168 hours.  Microbiology Results  Results for orders placed or performed during the hospital encounter of 10/10/15  MRSA PCR Screening     Status: None   Collection Time: 10/10/15  6:02 PM  Result Value Ref Range Status   MRSA by PCR NEGATIVE NEGATIVE Final    Comment:        The GeneXpert MRSA Assay (FDA approved for NASAL specimens only), is one component of a comprehensive MRSA colonization surveillance program. It is not intended to diagnose MRSA infection nor to guide or monitor treatment for MRSA infections.   C difficile quick scan w PCR reflex     Status: Abnormal   Collection Time: 10/12/15 12:50 PM  Result Value Ref Range Status   C Diff antigen POSITIVE (A) NEGATIVE Final   C Diff toxin NEGATIVE NEGATIVE Final   C Diff interpretation   Final    Positive for toxigenic C. difficile, active toxin production not detected. Patient has toxigenic C. difficile organisms present in the bowel, but toxin was not detected. The patient may be a carrier or the level of toxin in the sample was below the limit  of detection. This information should be used in conjunction with the  patient's clinical history when deciding on possible therapy.   Clostridium Difficile by PCR     Status: Abnormal   Collection Time: 10/12/15 12:50 PM  Result Value Ref Range Status   Toxigenic C Difficile by pcr POSITIVE (A) NEGATIVE Final    Comment: CRITICAL RESULT CALLED TO, READ BACK BY AND VERIFIED WITH: Dorita Fray, RN 10/12/2015 1454 BY JRS.     RADIOLOGY:  No results found.  EKG:   Orders placed or performed during the hospital encounter of 10/10/15  . ED EKG  . ED EKG      Management plans discussed with the patient, family and they are in agreement.  CODE STATUS:     Code Status Orders        Start     Ordered   10/10/15 1510  Full code   Continuous     10/10/15 1527      TOTAL TIME TAKING CARE OF THIS PATIENT: 37 minutes.    Enid Baas M.D on 10/17/2015 at 10:14 AM  Between 7am to 6pm - Pager - [phone]  After 6pm go to [email or web address] - password [email or web address] [facility]  [facility]  Office  [phone]  CC: Primary care physician; [name], MD

## 2015-10-17 NOTE — Progress Notes (Signed)
[facility]  ROUNDING NOTE   Subjective:   Hemodialysis for later today  Objective:  Vital signs in last 24 hours:  Temp:  [97.9 F (36.6 C)-98.5 F (36.9 C)] 98 F (36.7 C) (12/08 0759) Pulse Rate:  [81-91] 81 (12/08 0759) Resp:  [18-24] 18 (12/08 0759) BP: (121-132)/(53-64) 126/64 mmHg (12/08 0759) SpO2:  [97 %-100 %] 97 % (12/08 0759)  Weight change:  Filed Weights   10/13/15 1430 10/15/15 0930 10/15/15 1230  Weight: 81.3 kg (179 lb 3.7 oz) 82.4 kg (181 lb 10.5 oz) 81.5 kg (179 lb 10.8 oz)    Intake/Output: I/O last 3 completed shifts: In: 320 [P.O.:320] Out: 1800 [Urine:1800]   Intake/Output this shift:     Physical Exam: General: NAD, laying bed  Head: Normocephalic, atraumatic. Moist oral mucosal membranes  Eyes: Anicteric  Neck: Supple, trachea midline  Lungs:  clear, normal effort   Heart: Regular rate and rhythm  Abdomen:  Soft, nontender, BS present , obese  Extremities: 1+ peripheral edema. Bilateral toe amputations  Neurologic: Nonfocal, moving all four extremities  Skin: No lesions  Access: LUE AVF    Basic Metabolic Panel:  Recent Labs Lab 10/10/15 1418 10/11/15 0358 10/11/15 1014 10/13/15 1347 10/15/15 0439 10/17/15 0657  NA 134* 134*  --  127* 128* 129*  K 3.6 3.5  --  5.9* 5.1 5.8*  CL 92* 94*  --  90* 90* 91*  CO2 31 30  --  27 29 27   GLUCOSE 309* 272*  --  155* 188* 109*  BUN 19 25*  --  43* 51* 61*  CREATININE 2.41* 2.78*  --  3.53* 3.68* 3.96*  4.13*  CALCIUM 8.4* 8.7*  --  8.1* 8.2* 8.8*  PHOS  --   --  3.6 4.0  --  4.6    Liver Function Tests:  Recent Labs Lab 10/13/15 1347 10/17/15 0657  ALBUMIN 2.4* 2.1*   No results for input(s): LIPASE, AMYLASE in the last 168 hours. No results for input(s): AMMONIA in the last 168 hours.  CBC:  Recent Labs Lab 10/10/15 1418 10/11/15 0358 10/13/15 1346 10/15/15 0439 10/17/15 0657  WBC 8.2 5.8 7.0 6.8 8.5  NEUTROABS 6.9*  --   --   --   --   HGB 10.0*  9.4* 9.3* 8.6* 8.2*  HCT 31.9* 30.2* 29.7* 26.9* 26.3*  MCV 91.0 91.4 91.8 90.8 90.8  PLT 106* 111* 117* 113* 114*    Cardiac Enzymes: No results for input(s): CKTOTAL, CKMB, CKMBINDEX, TROPONINI in the last 168 hours.  BNP: Invalid input(s): POCBNP  CBG:  Recent Labs Lab 10/16/15 0751 10/16/15 1230 10/16/15 1705 10/16/15 2147 10/17/15 0756  GLUCAP 277* 252* 235* 213* 90    Microbiology: Results for orders placed or performed during the hospital encounter of 10/10/15  MRSA PCR Screening     Status: None   Collection Time: 10/10/15  6:02 PM  Result Value Ref Range Status   MRSA by PCR NEGATIVE NEGATIVE Final    Comment:        The GeneXpert MRSA Assay (FDA approved for NASAL specimens only), is one component of a comprehensive MRSA colonization surveillance program. It is not intended to diagnose MRSA infection nor to guide or monitor treatment for MRSA infections.   C difficile quick scan w PCR reflex     Status: Abnormal   Collection Time: 10/12/15 12:50 PM  Result Value Ref Range Status   C Diff antigen POSITIVE (A) NEGATIVE Final  C Diff toxin NEGATIVE NEGATIVE Final   C Diff interpretation   Final    Positive for toxigenic C. difficile, active toxin production not detected. Patient has toxigenic C. difficile organisms present in the bowel, but toxin was not detected. The patient may be a carrier or the level of toxin in the sample was below the limit  of detection. This information should be used in conjunction with the patient's clinical history when deciding on possible therapy.   Clostridium Difficile by PCR     Status: Abnormal   Collection Time: 10/12/15 12:50 PM  Result Value Ref Range Status   Toxigenic C Difficile by pcr POSITIVE (A) NEGATIVE Final    Comment: CRITICAL RESULT CALLED TO, READ BACK BY AND VERIFIED WITH: Dorita Fray, RN 10/12/2015 1454 BY JRS.     Coagulation Studies: No results for input(s): LABPROT, INR in the last 72  hours.  Urinalysis: No results for input(s): COLORURINE, LABSPEC, PHURINE, GLUCOSEU, HGBUR, BILIRUBINUR, KETONESUR, PROTEINUR, UROBILINOGEN, NITRITE, LEUKOCYTESUR in the last 72 hours.  Invalid input(s): APPERANCEUR    Imaging: No results found.   Medications:     . amitriptyline  25 mg Oral QHS  . aspirin  325 mg Oral Daily  . carvedilol  25 mg Oral BID WC  . cloNIDine  0.1 mg Oral BID  . clopidogrel  75 mg Oral Daily  . epoetin (EPOGEN/PROCRIT) injection  10,000 Units Intravenous Q T,Th,Sa-HD  . furosemide  80 mg Oral BID  . heparin subcutaneous  5,000 Units Subcutaneous Q12H  . insulin aspart  0-5 Units Subcutaneous QHS  . insulin aspart  0-9 Units Subcutaneous TID WC  . insulin detemir  20 Units Subcutaneous QHS  . isosorbide dinitrate  30 mg Oral Daily  . levothyroxine  75 mcg Oral QAC breakfast  . metroNIDAZOLE  500 mg Oral 3 times per day  . omega-3 acid ethyl esters  1 g Oral Daily  . pantoprazole  40 mg Oral Daily  . pregabalin  75 mg Oral BID  . rosuvastatin  20 mg Oral QHS  . sevelamer carbonate  1,600 mg Oral TID WC   acetaminophen, alteplase, alum & mag hydroxide-simeth, benzonatate, heparin, lidocaine (PF), lidocaine-prilocaine, nitroGLYCERIN, ondansetron, pentafluoroprop-tetrafluoroeth, promethazine  Assessment/ Plan:  44 y.o. male with long standing T1DM, HTN, ESRD, AOCD, SHPTH, LUE AVF, CAD s/p CABG 12/10, right foot diabetic ulcer, MSSA bacteremia, right scrotal cellulitis 5/12, admission for DKA 5/15, Echo on nov 5th 2015: EF 50-55%, concentric LVH, moderate to severe TR, severe pulmonary hypertension, scrotal/penile abscess with urethrocutaneous fistula  [facility] [address] TTS  1.  End-stage renal disease on hemodialysis Tuesday, Thursday, Saturday.   - hemodialysis for later today.  - Continue TTS schedule. Needs to comply with prescribed hemodialysis treatment  2.  Anemia chronic kidney disease. Hemoglobin 8.2. With thrombocytopenia. Platelets  114 - Pt will continue Epogen with dialysis.  3.  Secondary hyperparathyroidism.   Phos 4, PTH 107 - Continue Renvela 1600 mg by mouth 3 times a day with meals - discontinued cinacalcet.   4.   C. Difficile colitis.   -  Metronidazole.  5. Hypertension: blood pressure at goal.  - clonidine, carvedilol, fursoemide, isosorbide mononitrate   LOS: 7 V[place] Dory 12/8/20169:47 AM

## 2015-10-17 NOTE — Progress Notes (Signed)
Pre-hd tx 

## 2015-10-17 NOTE — Progress Notes (Signed)
HD tx ended 

## 2015-10-18 ENCOUNTER — Telehealth: Payer: Self-pay | Admitting: *Deleted

## 2015-10-18 NOTE — Care Management Important Message (Signed)
Important Message  Patient Details  Name: Taylor [name] MRN: [id number] Date of Birth: 02-Feb-1971   Medicare Important Message Given:  Yes  Patient appealed his discharge on 10/15/2015.  Had patient sign another IM for the [employer] appeal and placed it in the medical record on 10/15/2015.  Forgot to document this on 12/6    Eber Hong, RN 10/18/2015, 8:51 AM

## 2015-10-18 NOTE — Telephone Encounter (Signed)
Transition Care Management Follow-up Telephone Call   Date discharged? 10/16/15   How have you been since you were released from the hospital? Patient states he is doing well and feeling good   Do you understand why you were in the hospital? yes   Do you understand the discharge instructions? yes   Where were you discharged to? Home   Items Reviewed:  Medications reviewed: yes  Allergies reviewed: yes  Dietary changes reviewed: no  Referrals reviewed: yes, dialysis/CHF clinic   Functional Questionnaire:   Activities of Daily Living (ADLs):   He states they are independent in the following: ambulation, bathing and hygiene, feeding, continence, grooming, toileting and dressing States they require assistance with the following: None   Any transportation issues/concerns?: no   Any patient concerns? no   Confirmed importance and date/time of follow-up visits scheduled yes, 10/21/15 @ 1100  Provider Appointment booked with [name], MD  Confirmed with patient if condition begins to worsen call PCP or go to the ER.  Patient was given the office number and encouraged to call back with question or concerns.  : yes

## 2015-10-21 ENCOUNTER — Telehealth: Payer: Self-pay | Admitting: Family Medicine

## 2015-10-21 ENCOUNTER — Ambulatory Visit: Payer: Medicare Other | Admitting: Family Medicine

## 2015-10-21 DIAGNOSIS — Z0289 Encounter for other administrative examinations: Secondary | ICD-10-CM

## 2015-10-21 NOTE — Telephone Encounter (Signed)
Pt did not come in for their appt today for office visit. Please let me know if pt needs to be contacted immediately for follow up or no follow up needed. Best phone number to contact pt is [phone].

## 2015-10-21 NOTE — Telephone Encounter (Signed)
Yes please call to check on pt.

## 2015-10-23 ENCOUNTER — Other Ambulatory Visit: Payer: Self-pay | Admitting: *Deleted

## 2015-10-23 NOTE — Patient Outreach (Signed)
[facility] [employer]) Care Management  10/23/2015  Taylor [name] October 17, 1971 [id number]   Initial attempt for transition of care call.   1430 Unsuccessful attempt to reach patient at his listed best contact number # [phone]. Left a message.   1600 Received a return call from Taylor [name], discussed with him [facility] care management services, community care coordinator role, patient able to recall agreeing to participate  in the program and verbalized having time at present to speak with me.  Mr.Leja reports that he is doing okay, states that he has been attending his hemodialysis therapy on Tuesday,Thursday and Saturday his father is able to provide transportation. Patient reports that he has scales at home to weigh on but does not use them, states he just weighs at the hemodialysis center. Patient reports that he has all of his medication and is  taking his medication daily as prescribed, but denies being able to review medication at this time.  As I began to ask about follow up appointment, his phone hung up, I attempted returning a call to him , but he did not answer, I again left a message.   Plan I will attempt to reach patient on Thursday or Friday as part of transition of care   [name], RN, [facility] Care Management [phone]- Mobile [phone]- Toll Free Main Office .

## 2015-10-25 ENCOUNTER — Other Ambulatory Visit: Payer: Self-pay | Admitting: *Deleted

## 2015-10-25 NOTE — Patient Outreach (Signed)
[facility]) Care Management  10/25/2015  Taylor [name] 10-08-1971 [id number]  Initial Transition of care contact  Follow up phone call to [name] to complete initial transition of care contact, able to speak briefly with patient that stated  I don't feel like talking right now call me back next week.  Plan: Will attempt to reach patient on Monday, 12/19 his non hemodialysis day.  [name], RN, [facility] Care Management [phone]- Mobile [phone]- Toll Free Main Office

## 2015-10-28 ENCOUNTER — Other Ambulatory Visit: Payer: Self-pay | Admitting: *Deleted

## 2015-10-28 ENCOUNTER — Encounter: Payer: Self-pay | Admitting: *Deleted

## 2015-10-28 DIAGNOSIS — A0472 Enterocolitis due to Clostridium difficile, not specified as recurrent: Secondary | ICD-10-CM

## 2015-10-28 DIAGNOSIS — N186 End stage renal disease: Secondary | ICD-10-CM

## 2015-10-28 DIAGNOSIS — Z794 Long term (current) use of insulin: Secondary | ICD-10-CM

## 2015-10-28 DIAGNOSIS — Z79899 Other long term (current) drug therapy: Secondary | ICD-10-CM

## 2015-10-28 DIAGNOSIS — E11610 Type 2 diabetes mellitus with diabetic neuropathic arthropathy: Secondary | ICD-10-CM

## 2015-10-28 NOTE — Telephone Encounter (Signed)
Spoke with patient regarding missed appt on 10/21/15, asked him if he would like to reschedule. He asked if we could call him back after the holidays to reschedule the appt.

## 2015-10-28 NOTE — Telephone Encounter (Signed)
Kim with community care coordinators wants our office to reach out to pt to encourage him to f/u with Dr [name] for review of meds.

## 2015-10-28 NOTE — Patient Outreach (Addendum)
[facility]) Care Management  10/28/2015  Taylor [name] 1971/03/08 [id number]  Transition of care-week 2 Able to reach patient by telephone to discuss recent discharge from the hospital and explain [facility] care management, transition of care, Mr.Dunnaway is agreeable for [name] to follow up by telephone with him, but he has declined for me to schedule a home visit, patient states that he lives with his parents,and he doesn't want to inconvenience them, discussed the benefits of RN care management home visit. Mr.Snook reports that his Dad is available for providing transportation and assist him with showering. Patient reports that his has a history of falls and uses a walker at present, denies a fall since discharge from the hospital.  Chronic Kidney Disease :Mr.Millette reports that he has been attending hemodialysis on Tuesday,Thursday, and Saturday. Patient discussed his diagnosis of C.Diff, states that his diarrhea is improving. Mr.Mackie reports that he is taking his medication regularly, on days of hemodialysis states that he takes his medication afterwards.  Diabetes: Patient reports that he does check his blood sugar at home, but is unable to recall yesterday's reading, and states that he didn't check it today yet, but states he has taking his insulin. Encouraged patient with the importance of checking his blood sugar prior to administering insulin.  Post Hospital Discharge  Discussed the importance of patient being seen in the office of his primary care provider after a hospital admission for medication review and follow up.Mr.Westrup stated that he would make an appointment on today.I have offered to assist patient with making an appointment with PCP, he again states that he would do it or get his father to assist him.  Medications Patient is on greater than 10 medications, has Dad assist him by filling a weekly pill box. Mr.Lear denies having difficulty  affording his medication, and he states that he was able to get his prescriptions filled and taking medication as prescribed, he is not able to review medication list completely over the phone at this time.   Plan I will call Dr.Aron office regarding patient follow up- able to speak with staff member at office regarding follow up and they will reach out to patient. I will place a pharmacy consult as patient on greater than 10 medications- patient is in agreement I will send Dr.Aron this initial note, an barrier/involvement letter I will began care centered care plan  I will call patient on Wednesday to  follow up on making an appointment with PCP [facility] CM Care Plan Problem One        Most Recent Value   Care Plan Problem One  Recent Hosptial Admission, High risk for readmission   Role Documenting the Problem One  Care Management Coordinator   Care Plan for Problem One  Active   [facility] Long Term Goal (31-90 days)  Patient will not experience a hospital admission in the next 31 days   [facility] Long Term Goal Start Date  10/28/15   Interventions for Problem One Long Term Goal  Reviewed importance of notify MD of symptoms sooner, following up with PCP on a regualr basis,   [facility] CM Short Term Goal #1 (0-30 days)  Patient will not experience a hospital admisson in the next 30 days   [facility] CM Short Term Goal #1 Start Date  10/28/15   Interventions for Short Term Goal #1  Discussed importance of taking medication, following up with PCP for a post hospital visit, attending hemodialysis as prescribed,.    [facility]  CM Short Term Goal #2 (0-30 days)  Patient will make an appointment with PCP for follow up in the next 2 weeks   [facility] CM Short Term Goal #2 Start Date  10/28/15   Interventions for Short Term Goal #2  Discussed follow up appointment with PCP, offered to make patient appointment  he declined, placed a phone to Dr.Aron office to discuss     [name], RN, [facility] Care Management [phone]-  Mobile [phone]- Toll Free Main Office

## 2015-10-28 NOTE — Addendum Note (Signed)
Addended by: [name] A on: 10/28/2015 05:51 PM   Modules accepted: Orders

## 2015-10-31 ENCOUNTER — Ambulatory Visit: Payer: Medicare Other | Admitting: Family

## 2015-11-06 ENCOUNTER — Other Ambulatory Visit: Payer: Self-pay | Admitting: *Deleted

## 2015-11-06 DIAGNOSIS — I5032 Chronic diastolic (congestive) heart failure: Secondary | ICD-10-CM

## 2015-11-06 DIAGNOSIS — Z794 Long term (current) use of insulin: Principal | ICD-10-CM

## 2015-11-06 DIAGNOSIS — E1121 Type 2 diabetes mellitus with diabetic nephropathy: Secondary | ICD-10-CM

## 2015-11-06 DIAGNOSIS — N185 Chronic kidney disease, stage 5: Secondary | ICD-10-CM

## 2015-11-06 NOTE — Patient Outreach (Signed)
[facility]) Care Management  11/06/2015  Taylor [name] [birth year] [id number]   Transition of care week 3  Spoke with Taylor [name] by phone, he reports that he is doing better, no diarrhea for 1 week. Patient is attending hemodialysis every Tuesday,thursday,and Saturday, his father provides transportation. Taylor [name] states that he gets to see his Kidney doctor every other Tuesday at the center. Patient has not attended his PCP office for post hospital visit since being home, I have discussed the importance of this follow up, he said that he spoke with the office on last week and told them to call back after the holidays to schedule a office visit. Patient states the will call the first of next week for a office appointment with Dr.Aron.Patient discussed other upcoming MD appointments that he has with urologist, as patient has a suprapubic catheter.  Taylor [name] reports that his most recent blood sugar was 235 and that he is taking his novolog and Lantus insulin as ordered, Taylor [name]  father helps him with his medication preparation. Taylor [name] states that he gets his medication through [employer] or [facility].  Patient denies discomfort on today, continues to wear oxygen at 3 liters supplied by [employer].  Patient continues to decline home visit at this time, but is agreeable to continued transition of care calls   Plan Continue transition care call on next week Patient will make follow up appointment with PCP within next week  [name], RN, [facility] Care Management [phone]- Mobile [phone]- Toll Free Main Office

## 2015-11-13 ENCOUNTER — Ambulatory Visit: Payer: Medicare Other | Admitting: Urology

## 2015-11-15 ENCOUNTER — Encounter: Payer: Self-pay | Admitting: Urology

## 2015-11-15 ENCOUNTER — Other Ambulatory Visit: Payer: Self-pay | Admitting: *Deleted

## 2015-11-15 ENCOUNTER — Ambulatory Visit (INDEPENDENT_AMBULATORY_CARE_PROVIDER_SITE_OTHER): Payer: Medicare Other | Admitting: Urology

## 2015-11-15 VITALS — BP 183/93 | HR 82 | Ht 72.0 in | Wt 195.0 lb

## 2015-11-15 DIAGNOSIS — Z435 Encounter for attention to cystostomy: Secondary | ICD-10-CM

## 2015-11-15 DIAGNOSIS — N360 Urethral fistula: Secondary | ICD-10-CM

## 2015-11-15 DIAGNOSIS — N4889 Other specified disorders of penis: Secondary | ICD-10-CM | POA: Diagnosis not present

## 2015-11-15 NOTE — Patient Outreach (Addendum)
[facility]) Care Management  11/15/2015  Taylor [name] 08/24/1971 [id number]   Transition of care week 4  Spoke with Nena Alexander, reports that he is doing pretty good on today, he had a visit at MD office to have his suprapubic catheter changed out. Da reports that his blood sugars are staying between 135 and 128, and that he is taking his insulin and medications as prescribed. Patient states that he is attending all of his hemodialysis appointments. Patient has made his follow up appointment with Dr.Aron for 1/16, and his father will provide transportation.  Patient denies further concerns at this time. Plan Initial home visit is scheduled for Wednesday, January 11.  [name], RN, [facility] Care Management [phone] Mobile [phone]- Toll Free Main Office

## 2015-11-15 NOTE — Progress Notes (Signed)
10:44 AM   Taylor [name] [birth year] [id number]  Referring provider: Dianne Dun, MD [address] [place], [place] [address]  Chief Complaint  Patient presents with  . Urinary Retention    spt change    HPI: Taylor [name] is a 45 year old who presents today with his father for SPT exchange.  He states he has not had any with his SPT since his last visit.   He has not had any fevers, gross hematuria or mental status changes.    He was unable to keep his appointment with Dr. Achilles Dunk at [facility] next due to being hospitalized for pneumonia.  It has been rescheduled for sometime in February.    PMH: Past Medical History  Diagnosis Date  . End stage renal disease on dialysis (HCC)     LUE fistula  . Type I diabetes mellitus (HCC)     a. 03/2014 admitted with HNK to [facility]. b. TTS  . Diabetic neuropathy (HCC)     severe, s/p multiple toe amputation  . Hypothyroidism   . Hypertension   . Hyperlipidemia   . H/O hiatal hernia   . GERD (gastroesophageal reflux disease)   . Anxiety   . Sebaceous cyst     side of neck  . Pneumonia     2010  . Anemia     a. req PRBC's 2011.  [name] PAD (peripheral artery disease) (HCC)     a. s/p amputation of toes on the right;  b. left LE claudication.  . Coronary artery disease     a. s/p MI;  b. 10/2009 CABG x 3 @ [facility]: LIMA->LAD, VG->OM3, VG->RPDA; c. 11/2010 Cath 3/3 patent grafts;  d 12/2012 Cath: LM 30d, LAD 85p, D1 70, D2 90, LCX 40ost, OM2 100, RCA 90p, 148m, L->LAD ok, VG->OM3 ok, VG->RPDA 30, EF 50%->Med Rx.  . Cataract     right  . Valvular disease     a. 11/2012 Echo: EF 55-60%, mild LVH, mild MR, mild bi-atrial enlargement, mild-mod TR, PASP ; b. echo 03/2014: EF 50-55%, nl WM, select images concerning for bicuspid aortic valve w/ nl thicknes of leaflets, mild MR, mild bi-atrial dilatation, RV mild dilatation - wall thickness nl, mod TR - select images appears to be mod to sev, PASP at least mod elevated.  . Diastolic CHF (HCC)      see echo above  . Depression   . Arthritis     rheumatoid arthritis   . Myocardial infarction (HCC) 2010  . Pulmonary HTN (HCC)     a. continuation of 03/2014 echo PASP @ least mod elevated. RVSP 52 mm Hg. Parasternal long axis estimated @ 85 mm Hg  . Scrotal abscess     a. s/p multiple I&D  . Penile abscess     a. s/p multiple I&D  . Hematemesis     a. EGD 2016 with LA Grade C esophagitis, continued on Protonix   . Chronic respiratory failure (HCC)     a. on 3L oxygen via nasal cannula  . Anxiety   . Acute delirium   . Sepsis (HCC)   . 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 MYOCARDIAL INFARCTION, HX OF 01/26/2011    Qualifier: Diagnosis of  By: [name] MD, [name]    . HTN (hypertension) 02/11/2011  . Pulmonary hypertension associated with end stage renal disease on dialysis (HCC) 04/16/2014  . Hyperkalemia   . Hypothyroidism   . Osteomyelitis (HCC)  Surgical History: Past Surgical History  Procedure Laterality Date  . Dialysis fistula creation      left upper arm fistula  . Amputation      TOES ON BOTH FEET  . Abscess drainage  Behind right ear/occipital scalp  . Skin graft    . Eye surgery  1999  . Coronary artery bypass graft  10/2009    [facility] (Dr. Katrinka Blazing)  . Foot amputation    . Cardiac catheterization    . Cardiac catheterization  2/14    [facility]: severe 3 vessel CAD with patent grafts, RHC: moderately elevated PCW and pulmonary hypertension  . Penile prosthesis implant N/A 07/24/2014    Procedure: SALINE PENILE INJECTION WITH DISSECTION OF CORPORA ,  IMPLANTATION OF COLOPLAST PENILE PROTHESIS INFLATABLE;  Surgeon: [name], MD;  Location: [facility] ORS;  Service: Urology;  Laterality: N/A;  . Removal of penile prosthesis N/A 08/22/2014    Procedure: EXPLANT OF INFECTED  PENILE PROSTHESIS;  Surgeon: [name], MD;  Location: [facility] ORS;  Service: Urology;  Laterality: N/A;  . Irrigation and debridement abscess Bilateral 12/14/2014    Procedure: Bilateral Corporal  Irrigation with Cultures and Drainage, Penrose Drain Insertion;  Surgeon: [name], MD;  Location: [facility] OR;  Service: Urology;  Laterality: Bilateral;  . Scrotal exploration N/A 02/03/2015    Procedure: IRRIGATION AND DEBRIDEMENT SCROTAL/PENILE ABSCESS;  Surgeon: [name], MD;  Location: [facility] OR;  Service: Urology;  Laterality: N/A;  . Cystoscopy N/A 02/03/2015    Procedure: CYSTOSCOPY;  Surgeon: [name], MD;  Location: [facility] OR;  Service: Urology;  Laterality: N/A;  . Esophagogastroduodenoscopy N/A 03/18/2015    Procedure: ESOPHAGOGASTRODUODENOSCOPY (EGD);  Surgeon: [name], MD;  Location: [facility] ENDOSCOPY;  Service: Endoscopy;  Laterality: N/A;  . Cardiac catheterization N/A 05/27/2015    Procedure: Right Heart Cath;  Surgeon: [name], MD;  Location: [facility] INVASIVE CV LAB;  Service: Cardiovascular;  Laterality: N/A;    Home Medications:    Medication List       This list is accurate as of: 11/15/15 10:44 AM.  Always use your most recent med list.               acetaminophen 500 MG tablet  Commonly known as:  TYLENOL  Take 1,000 mg by mouth every 6 (six) hours as needed for mild pain or headache.     alum & mag hydroxide-simeth 400-400-40 MG/5ML suspension  Commonly known as:  MAALOX PLUS  Take 30 mLs by mouth every 4 (four) hours as needed for indigestion.     amitriptyline 25 MG tablet  Commonly known as:  ELAVIL  Take 1 tablet (25 mg total) by mouth at bedtime.     aspirin 325 MG EC tablet  Take 325 mg by mouth daily. Reported on 11/15/2015     aspirin EC 81 MG tablet  Take 81 mg by mouth daily.     carvedilol 25 MG tablet  Commonly known as:  COREG  Take 1 tablet (25 mg total) by mouth 2 (two) times daily with a meal.     cinacalcet 30 MG tablet  Commonly known as:  SENSIPAR  Take 30 mg by mouth daily.     cloNIDine 0.1 MG tablet  Commonly known as:  CATAPRES  Take 0.1 mg by mouth 2 (two) times daily.     clopidogrel 75 MG tablet    Commonly known as:  PLAVIX  Take 1 tablet (75 mg total) by mouth  daily.     DHA-EPA-VITAMIN E PO  Take 1 capsule by mouth daily.     Fish Oil 1000 MG Caps  Take 1 capsule by mouth daily.     furosemide 80 MG tablet  Commonly known as:  LASIX  Take 80 mg by mouth 2 (two) times daily.     insulin aspart 100 UNIT/ML injection  Commonly known as:  novoLOG  Inject 12 Units into the skin 3 (three) times daily before meals. Reported on 11/15/2015     insulin aspart 100 UNIT/ML injection  Commonly known as:  novoLOG  Inject 0-7 Units into the skin 4 (four) times daily -  before meals and at bedtime.     insulin detemir 100 UNIT/ML injection  Commonly known as:  LEVEMIR  Inject 0.14 mLs (14 Units total) into the skin at bedtime.     insulin glargine 100 UNIT/ML injection  Commonly known as:  LANTUS  Inject 14 Units into the skin at bedtime.     isosorbide dinitrate 30 MG tablet  Commonly known as:  ISORDIL  Take 30 mg by mouth daily.     levothyroxine 75 MCG tablet  Commonly known as:  SYNTHROID, LEVOTHROID  Take 1 tablet (75 mcg total) by mouth daily before breakfast.     nitroGLYCERIN 0.4 MG SL tablet  Commonly known as:  NITROSTAT  Place 0.4 mg under the tongue every 5 (five) minutes as needed for chest pain.     pantoprazole 40 MG tablet  Commonly known as:  PROTONIX  Take 40 mg by mouth 2 (two) times daily.     pregabalin 75 MG capsule  Commonly known as:  LYRICA  Take 75 mg by mouth 2 (two) times daily.     PROAIR HFA 108 (90 Base) MCG/ACT inhaler  Generic drug:  albuterol     Probiotic Caps  Take 1 capsule by mouth daily.     rosuvastatin 20 MG tablet  Commonly known as:  CRESTOR  Take 1 tablet (20 mg total) by mouth at bedtime.     sevelamer carbonate 800 MG tablet  Commonly known as:  RENVELA  Take 1,600 mg by mouth 3 (three) times daily with meals.        Allergies:  Allergies  Allergen Reactions  . Amoxicillin-Pot Clavulanate Nausea And Vomiting  and Itching  . 2nd Skin Quick Heal Other (See Comments)  . Rifampin Nausea And Vomiting  . Tape Other (See Comments)    Family History: Family History  Problem Relation Age of Onset  . Heart disease Other   . Hypertension Other   . Hypertension Mother   . Heart disease Mother   . Diabetes Mother   . Birth defects Paternal Uncle     unaware  . Birth defects Paternal Grandmother     breast  . Kidney disease Neg Hx   . Prostate cancer Neg Hx     Social History:  reports that he has never smoked. He quit smokeless tobacco use about 20 years ago. His smokeless tobacco use included Chew. He reports that he does not drink alcohol or use illicit drugs.  ROS: UROLOGY Frequent Urination?: No Hard to postpone urination?: No Burning/pain with urination?: No Get up at night to urinate?: No Leakage of urine?: No Urine stream starts and stops?: No Trouble starting stream?: No Do you have to strain to urinate?: No Blood in urine?: Yes Urinary tract infection?: No Sexually transmitted disease?: No Injury to kidneys or bladder?: No  Painful intercourse?: No Weak stream?: No Erection problems?: No Penile pain?: No  Gastrointestinal Nausea?: Yes Vomiting?: Yes Indigestion/heartburn?: No Diarrhea?: Yes Constipation?: No  Constitutional Fever: No Night sweats?: No Weight loss?: No Fatigue?: No  Skin Skin rash/lesions?: No Itching?: No  Eyes Blurred vision?: No Double vision?: No  Ears/Nose/Throat Sore throat?: No Sinus problems?: No  Hematologic/Lymphatic Swollen glands?: No Easy bruising?: Yes  Cardiovascular Leg swelling?: Yes Chest pain?: No  Respiratory Cough?: No Shortness of breath?: No  Endocrine Excessive thirst?: No  Musculoskeletal Back pain?: Yes Joint pain?: Yes  Neurological Headaches?: No Dizziness?: Yes  Psychologic Depression?: Yes Anxiety?: Yes  Physical Exam: Blood pressure 170/88, pulse 80, resp. rate 16. Constitutional:  Well nourished. Alert and oriented, No acute distress. HEENT: [place] AT, moist mucus membranes. Trachea midline, no masses. Cardiovascular: No clubbing, cyanosis, or edema. Respiratory: Normal respiratory effort, no increased work of breathing. GI: Abdomen with ascites.  SPT site clean and dry.  No erythema.   GU: No CVA tenderness.  No bladder fullness or masses.  Penoscrotal exam clinically improved from previous occasions, no penile edema, no erythema, no drainage from left distal shaft opening with manipulation. Meatus is widely patent from previous debridement.  Scrotum without lesions, cysts, rashes and/or edema.  Testicles are located scrotally bilaterally. No masses are appreciated in the testicles. Left and right epididymis are normal. Skin: Skin breakdown in the sacral area. Lymph: No cervical or inguinal adenopathy. Neurologic: Grossly intact, no focal deficits, moving all 4 extremities. Psychiatric: Normal mood and affect.     Laboratory Data: Lab Results  Component Value Date   WBC 8.5 10/17/2015   HGB 8.2* 10/17/2015   HCT 26.3* 10/17/2015   MCV 90.8 10/17/2015   PLT 114* 10/17/2015    Lab Results  Component Value Date   CREATININE 4.13* 10/17/2015   CREATININE 3.96* 10/17/2015    Lab Results  Component Value Date   HGBA1C 9.4* 03/04/2015    Procedure:     Suprapubic Cath Change  Patient is present today for a suprapubic catheter change due to urinary retention.  10 ml of water was drained from the balloon, a 16 FR foley cath was removed from the tract with out difficulty.  Site was cleaned and prepped in a sterile fashion with betadine.  A 16 FR foley cath placed into the tract no complications were noted.  Cloudy, yellow urine was returned.    Preformed by: Lajuana Ripple PA-C  Follow up: One Month   Assessment & Plan:    1. 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 Achilles Dunk at [facility] sometime next week.[name]  Pending Dr. 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    2. SPT exchange: SPT was exchanged.  He will return in 1 month time for his next SPT exchange.   Return in about 1 month (around 12/16/2015) for SPT exchange.  Michiel Cowboy, PA-C  [facility] [address], [address] [place], [place] [address] [phone]

## 2015-11-18 ENCOUNTER — Other Ambulatory Visit: Payer: Self-pay | Admitting: Pharmacist

## 2015-11-18 NOTE — Patient Outreach (Signed)
[facility]) Care Management  [facility]   11/18/2015  Taylor [name] 10-16-1971 [id number]  Subjective: Taylor [name] is a 44yo who was referred to [facility] for medication review.  I made outreach call to patient to review medications.  I reviewed purpose and proper use of all medications.  Noted that patient had duplicate orders for aspirin on medication list (81mg  and 325mg ).  Patient confirms he is taking aspirin 81mg  daily.  Also noted that patient had duplicate orders for Lantus and Levemir.  Patient confirms he is taking Lantus 14 units daily because Levemir was not covered by his insurance.  Patient reports his dad assists him by filling a weekly pill box.  Patient reports compliance with prescribed medications and states his dad ensures he takes all medications every day.  He denies having difficulty affording his medications.  Patient denies any questions or concerns regarding his medications.    Objective:   Current Medications: Current Outpatient Prescriptions  Medication Sig Dispense Refill  . acetaminophen (TYLENOL) 500 MG tablet Take 1,000 mg by mouth every 6 (six) hours as needed for mild pain or headache.     alum & mag hydroxide-simeth (MAALOX PLUS) 400-400-40 MG/5ML suspension Take 30 mLs by mouth every 4 (four) hours as needed for indigestion.     amitriptyline (ELAVIL) 25 MG tablet Take 1 tablet (25 mg total) by mouth at bedtime. 30 tablet 1  . aspirin EC 81 MG tablet Take 81 mg by mouth daily.    . carvedilol (COREG) 25 MG tablet Take 1 tablet (25 mg total) by mouth 2 (two) times daily with a meal. 180 tablet 1  . cinacalcet (SENSIPAR) 30 MG tablet Take 30 mg by mouth daily.    . cloNIDine (CATAPRES) 0.1 MG tablet Take 0.1 mg by mouth 2 (two) times daily.    . clopidogrel (PLAVIX) 75 MG tablet Take 1 tablet (75 mg total) by mouth daily. 90 tablet 3  . DHA-EPA-VITAMIN E PO Take 1 capsule by mouth daily.    . furosemide  (LASIX) 80 MG tablet Take 80 mg by mouth 2 (two) times daily.    . insulin aspart (NOVOLOG) 100 UNIT/ML injection Inject 0-7 Units into the skin 4 (four) times daily -  before meals and at bedtime. 10 mL 11  . insulin aspart (NOVOLOG) 100 UNIT/ML injection Inject 7 Units into the skin 3 (three) times daily before meals. Reported on 11/15/2015    . insulin glargine (LANTUS) 100 UNIT/ML injection Inject 14 Units into the skin at bedtime.    . isosorbide dinitrate (ISORDIL) 30 MG tablet Take 30 mg by mouth daily.    [name] levothyroxine (SYNTHROID, LEVOTHROID) 75 MCG tablet Take 1 tablet (75 mcg total) by mouth daily before breakfast. 90 tablet 1  . nitroGLYCERIN (NITROSTAT) 0.4 MG SL tablet Place 0.4 mg under the tongue every 5 (five) minutes as needed for chest pain.    . Omega-3 Fatty Acids (FISH OIL) 1000 MG CAPS Take 1 capsule by mouth daily.    . pantoprazole (PROTONIX) 40 MG tablet Take 40 mg by mouth 2 (two) times daily.    . pregabalin (LYRICA) 75 MG capsule Take 75 mg by mouth 2 (two) times daily.     [name] PROAIR HFA 108 (90 Base) MCG/ACT inhaler   1  . Probiotic CAPS Take 1 capsule by mouth daily. 10 capsule 0  . rosuvastatin (CRESTOR) 20 MG tablet Take 1 tablet (20 mg total) by  mouth at bedtime. 30 tablet 5  . sevelamer carbonate (RENVELA) 800 MG tablet Take 800 mg by mouth 3 (three) times daily with meals.      No current facility-administered medications for this visit.   Functional Status: In your present state of health, do you have any difficulty performing the following activities: 11/06/2015 10/28/2015  Hearing? - N  Vision? - N  Difficulty concentrating or making decisions? - N  Walking or climbing stairs? - Y  Dressing or bathing? - Y  Doing errands, shopping? - Y  Preparing Food and eating ? - Y  Using the Toilet? - N  In the past six months, have you accidently leaked urine? - Y  Do you have problems with loss of bowel control? - Y  Managing your Medications? (No Data) Y   Managing your Finances? - Y  Housekeeping or managing your Housekeeping? - Y   Fall/Depression Screening: PHQ 2/9 Scores 10/28/2015 04/09/2014  PHQ - 2 Score 1 0  PHQ- 9 Score 4 -    Assessment: Medication review:  Drugs sorted by system:  Neurologic/Psychologic: amitriptyline  Cardiovascular: aspirin 81mg , carvedilol, clonidine, clopidogrel, DHA-EPA-vitamin E, furosemide, isosorbide dinitrate, nitroglycerin sublingual, omega-3 fatty acids, rosuvastatin  Pulmonary/Allergy: albuterol HFA   Gastrointestinal: Maalox, pantoprazole  Endocrine: Novolog, Lantus, levothyroxine  Renal: cinacalcet, sevelamer  Topical: none  Pain: acetaminophen, pregabalin  Vitamins/Minerals: probiotics  Infectious Diseases: none  Miscellaneous: none   Duplications in therapy: none noted  Gaps in therapy: ACE inhibitor or ARB is indicated for DM with microalbumin/creatinine ratio >300/CAD with history of MI (However, will not recommend use at this time as I believe risk outweigh benefit due to patient's history of hyperkalemia (K 5.8 on 10/17/15)) Medications to avoid in the elderly: N/A, pt <65yo  Drug interactions:  Aspirin and clopidogrel (may result in increased risk of bleeding), clonidine and amitriptyline (antihypertensive effects of clonidine may be decreased by amitriptyline and amitriptyline may worsen rebound reactions such as hypertension and tachycardia from abrupt clonidine withdrawal); levothyroxine and sevelamer (efficacy of levothyroxine may be decreased with concomitant administration of sevelamer)  Other issues noted: Based on patient's renal function, dose adjustment of pregabalin is indicated    Plan: 1.  Medication review:   - All medications appear appropriate based on patient's problem list.   - Patient is on aspirin and clopidogrel which may increase the potential for a bleed.  I believe benefit of dual antiplatelet therapy outweighs risk.   - Patient is on clonidine and  amitriptyline.  Antihypertensive effects of clonidine may be decreased by amitriptyline and amitriptyline may worsen rebound reactions such as hypertension and tachycardia from abrupt clonidine withdrawal.  Please consider monitoring blood pressure carefully if either drug is stopped.   - Patient is on sevelamer and levothyroxine.  Sevelamer may decrease efficacy of levothyroxine.  Please consider ordering updated thyroid function test as most recent TSH was elevated due to noncompliance in May 2016.     - Based on patient's renal function, please consider renal dose adjustment of pregabalin to 75mg  daily with a single supplementary dose of 100 or 150 mg posthemodialysis.   2.  Will send a letter to patient's primary care provider with this information.   3.  Will close pharmacy program.     June 2016, Pharm.D. Pharmacy Resident [facility] [phone]

## 2015-11-19 ENCOUNTER — Encounter: Payer: Self-pay | Admitting: Pharmacist

## 2015-11-20 ENCOUNTER — Telehealth: Payer: Self-pay

## 2015-11-20 ENCOUNTER — Other Ambulatory Visit: Payer: Self-pay | Admitting: *Deleted

## 2015-11-20 NOTE — Telephone Encounter (Signed)
Agree with plan-will cc to Dr [name]

## 2015-11-20 NOTE — Telephone Encounter (Addendum)
[facility] community case mgr left v/m after 4:30 pm that she had seen pt earlier today. BS reading was listed as HIGH BS>600. Pt  Took sliding scale insulin 10 units and 30 mins later BS registered 415. The only thing pt had that day was russian tea. Selena Batten can be contacted if needed. Dr [name] out of office and lateness of day sending to Dr [name] and Dr Milinda Antis.

## 2015-11-20 NOTE — Patient Outreach (Signed)
[facility]) Care Management   11/20/2015  MARKEL KURTENBACH [birth year] [id number]  Taylor [name] is an 45 y.o. male  Subjective: " I haven't eaten this am, I just had some russian tea, that is what I had a taste for, I usually try to monitor how much liquids I take in daily.  BP 148/80 mmHg  Pulse 80  Resp 20  SpO2 100% Objective:   Review of Systems  Constitutional: Negative.   HENT: Negative.   Eyes: Negative.   Respiratory: Negative.   Cardiovascular: Positive for leg swelling.       Bilateral lower extremities, tight  Genitourinary:       Has suprapubic cathether  Musculoskeletal: Positive for back pain. Negative for falls.  Skin: Negative.   Neurological: Negative for dizziness.  Psychiatric/Behavioral: Negative for depression and suicidal ideas.    Physical Exam  Constitutional: He is oriented to person, place, and time. He appears well-developed and well-nourished.  Cardiovascular: Normal rate.   Respiratory: Effort normal and breath sounds normal.  Musculoskeletal: He exhibits edema.  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   Neurological: He is oriented to person, place, and time.  Skin: Skin is warm and dry. No rash noted.  Feet very dry and scaly  Psychiatric: He has a normal mood and affect. His behavior is normal. Judgment and thought content normal.    Current Medications:   Current Outpatient Prescriptions  Medication Sig Dispense Refill  . acetaminophen (TYLENOL) 500 MG tablet Take 1,000 mg by mouth every 6 (six) hours as needed for mild pain or headache.     [name] alum & mag hydroxide-simeth (MAALOX PLUS) 400-400-40 MG/5ML suspension Take 30 mLs by mouth every 4 (four) hours as needed for indigestion.     [name] amitriptyline (ELAVIL) 25 MG tablet Take 1 tablet (25 mg total) by mouth at bedtime. 30 tablet 1  . aspirin EC 81 MG tablet Take 81 mg by mouth daily.    . calcium-vitamin D  (OSCAL WITH D) 250-125 MG-UNIT tablet Take 1 tablet by mouth daily.    . carvedilol (COREG) 25 MG tablet Take 1 tablet (25 mg total) by mouth 2 (two) times daily with a meal. 180 tablet 1  . cinacalcet (SENSIPAR) 30 MG tablet Take 30 mg by mouth daily.    . citalopram (CELEXA) 20 MG tablet Take 20 mg by mouth daily.    . cloNIDine (CATAPRES) 0.1 MG tablet Take 0.1 mg by mouth 2 (two) times daily.    . clopidogrel (PLAVIX) 75 MG tablet Take 1 tablet (75 mg total) by mouth daily. 90 tablet 3  . DHA-EPA-VITAMIN E PO Take 1 capsule by mouth daily.    . furosemide (LASIX) 80 MG tablet Take 80 mg by mouth 2 (two) times daily.    . insulin aspart (NOVOLOG) 100 UNIT/ML injection Inject 0-7 Units into the skin 4 (four) times daily -  before meals and at bedtime. 10 mL 11  . insulin aspart (NOVOLOG) 100 UNIT/ML injection Inject 7 Units into the skin 3 (three) times daily before meals. Reported on 11/15/2015    . insulin glargine (LANTUS) 100 UNIT/ML injection Inject 14 Units into the skin at bedtime.    . isosorbide dinitrate (ISORDIL) 30 MG tablet Take 30 mg by mouth daily.    [name] levothyroxine (SYNTHROID, LEVOTHROID) 75 MCG tablet Take 1 tablet (75 mcg total) by mouth daily before breakfast. 90 tablet 1  .  lisinopril (PRINIVIL,ZESTRIL) 10 MG tablet Take 10 mg by mouth daily.    . metolazone (ZAROXOLYN) 5 MG tablet Take 5 mg by mouth daily.    . nitroGLYCERIN (NITROSTAT) 0.4 MG SL tablet Place 0.4 mg under the tongue every 5 (five) minutes as needed for chest pain.    . Omega-3 Fatty Acids (FISH OIL) 1000 MG CAPS Take 1 capsule by mouth daily.    [name] omeprazole (PRILOSEC) 40 MG capsule Take 40 mg by mouth daily.    . pantoprazole (PROTONIX) 40 MG tablet Take 40 mg by mouth 2 (two) times daily.    . pregabalin (LYRICA) 75 MG capsule Take 75 mg by mouth 2 (two) times daily.     [name] PROAIR HFA 108 (90 Base) MCG/ACT inhaler   1  . Probiotic CAPS Take 1 capsule by mouth daily. 10 capsule 0  . rosuvastatin (CRESTOR)  20 MG tablet Take 1 tablet (20 mg total) by mouth at bedtime. 30 tablet 5  . sevelamer carbonate (RENVELA) 800 MG tablet Take 800 mg by mouth 3 (three) times daily with meals.     . traZODone (DESYREL) 50 MG tablet Take 50 mg by mouth at bedtime.     No current facility-administered medications for this visit.    Functional Status:   In your present state of health, do you have any difficulty performing the following activities: 11/06/2015 10/28/2015  Hearing? - N  Vision? - N  Difficulty concentrating or making decisions? - N  Walking or climbing stairs? - Y  Dressing or bathing? - Y  Doing errands, shopping? - Y  Preparing Food and eating ? - Y  Using the Toilet? - N  In the past six months, have you accidently leaked urine? - Y  Do you have problems with loss of bowel control? - Y  Managing your Medications? (No Data) Y  Managing your Finances? - Y  Housekeeping or managing your Housekeeping? - Y    Fall/Depression Screening:    PHQ 2/9 Scores 11/20/2015 10/28/2015 04/09/2014  PHQ - 2 Score 1 1 0  PHQ- 9 Score - 4 -    Assessment:   Initial home visit, patient lives at home with his parents, and his father is available to assist with him transportation, and daily ADL's. Patient sitting in his lift chair on arrival  Diabetes: Type 1 Patient reports that he checks his blood sugar at least daily, when asked to review his meter patient states that he shares the meter with his mother so results would not be accurate for his record. Patient checked his blood sugar during visit, reading result "HI", per patient's father this indicates that the reading is greater than 600 on that meter. Mr.Schiano, had not taken any insulin this am, because it stated he did not eat anything he just drank the tea. The patient's father  then gave him 10 units of novolog insulin per the sliding scale that he uses at times : States MD is aware of sliding scale. Patient states that he does not use the sliding  scale on a regular basis, he just takes his novolog  7 units 3 times a day and his Lantus at night. Recorded from the scale that patient has written down.  For blood sugar : 200-250 ( 2 units ) 251- 300 ( 4 units) 301-350 (6 units ) 351 -400(  8 units) 401-450 10 units- Greater than 450 call MD Mr.Buboltz reports on episode of hypoglycemia on last week, upon his  father finding him sweaty lying in bed early morning with blood sugar 29, his father immediately provided him with orange juice and peanut butter sandwich and was able to get blood sugar above 70. Mr.Danek has a least 5 bottles of one touch glucose strips in date his meter stopped working a while ago and never got a new meter. Discussed importance of obtaining a new meter, his father can assist him with that. Discussed importance of rechecking a blood sugar after an episode of hypoglycemia/Hyperglycemia, and considering checking his blood sugar more than once a day and consistent food intake and taking insulin as ordered.  Chronic Kidney Disease: Patient reports that he attends all Hemodialysis treatments on Tuesday, Thursday and Saturday, except he missed on this past Saturday due to weather conditions but returned on Tuesday.  Heart Failure: Patients reports taking all of his medication daily with the help of his father that organizes medication in a pill box. Patient states that he does not weigh at home, due to difficulty balancing on scales, he weighs 3 times a week at dialysis sessions. Patient denies feeling short of breath or chest discomfort. Discussed importance of adherence to fluid restriction monitoring, and limiting salt, including salty snacks.   Plan:  I will notify Dr.Aron office of elevated blood sugar from today's reading and patient's treatment,I had to leave a message on the nurse line and to inform them that the patient called me after the visit and his blood sugar was down to 415. Diabetes: patient will obtain  a meter and check blood sugar more than once daily, record and notify MD on consistent elevations  CKD: patient will continue to attend all Hemodialysis treatments I will follow up with patient on next week regarding blood sugar readings Patient will attend scheduled appointment with Dr.Aron on 1/16.   [facility] CM Care Plan Problem One        Most Recent Value   Care Plan Problem One  Recent Hosptial Admission, High risk for readmission   Role Documenting the Problem One  Care Management Coordinator   Care Plan for Problem One  Active   [facility] Long Term Goal (31-90 days)  Patient will not experience a hospital admission in the next 31 days   [facility] Long Term Goal Start Date  10/28/15   Interventions for Problem One Long Term Goal  Reviewed importance of notify MD of symptoms sooner, following up with PCP on a regualr basis,   [facility] CM Short Term Goal #1 (0-30 days)  Patient will not experience a hospital admisson in the next 30 days   [facility] CM Short Term Goal #1 Start Date  10/28/15   Interventions for Short Term Goal #1  Discussed importance of taking medication, following up with PCP for a post hospital visit, attending hemodialysis as prescribed,.    [facility] CM Short Term Goal #2 (0-30 days)  Patient will make an appointment with PCP for follow up in the next 2 weeks   [facility] CM Short Term Goal #2 Start Date  10/28/15   [facility] CM Short Term Goal #2 Met Date  11/15/15   Interventions for Short Term Goal #2  Discussed importance of keeping appointment on 1/16.   [facility] CM Short Term Goal #3 (0-30 days)  Patient will obtain a CBG meter in the next 7 days    [facility] CM Short Term Goal #3 Start Date  11/20/15   Interventions for Short Tern Goal #3  Discussed with patient the importance of having  his own meter for daily use and recording      Joylene Draft, RN, [place] [facility] [phone]- Mobile [phone]- [place]

## 2015-11-20 NOTE — Telephone Encounter (Signed)
Dr Milinda Antis said to monitor BS q 2 h and if elevated again HIGH to go to ED for eval. Sending to Dr [name] as well. Spoke with pt and advised as instructed; pt voiced understanding. Pt has [facility] f/u appt with Dr [name] on 11/25/15. Pt request cb on 11/21/15 to advise what should do about taking insulin.

## 2015-11-21 NOTE — Telephone Encounter (Signed)
Please call to check on pt. 

## 2015-11-21 NOTE — Telephone Encounter (Signed)
Spoke to pt and advised per Dr Milinda Antis. Pt states BS was 157 this am at 1000.

## 2015-11-25 ENCOUNTER — Ambulatory Visit (INDEPENDENT_AMBULATORY_CARE_PROVIDER_SITE_OTHER)
Admission: RE | Admit: 2015-11-25 | Discharge: 2015-11-25 | Disposition: A | Discharge status: Home / Self Care | Payer: Medicare Other | Source: Ambulatory Visit | Attending: Family Medicine | Admitting: Family Medicine

## 2015-11-25 ENCOUNTER — Encounter: Payer: Self-pay | Admitting: Family Medicine

## 2015-11-25 ENCOUNTER — Ambulatory Visit (INDEPENDENT_AMBULATORY_CARE_PROVIDER_SITE_OTHER): Payer: Medicare Other | Admitting: Family Medicine

## 2015-11-25 VITALS — BP 150/74 | HR 79 | Temp 97.9°F

## 2015-11-25 DIAGNOSIS — A0472 Enterocolitis due to Clostridium difficile, not specified as recurrent: Secondary | ICD-10-CM

## 2015-11-25 DIAGNOSIS — R140 Abdominal distension (gaseous): Secondary | ICD-10-CM | POA: Diagnosis not present

## 2015-11-25 DIAGNOSIS — A047 Enterocolitis due to Clostridium difficile: Secondary | ICD-10-CM

## 2015-11-25 DIAGNOSIS — I5033 Acute on chronic diastolic (congestive) heart failure: Secondary | ICD-10-CM

## 2015-11-25 DIAGNOSIS — E1022 Type 1 diabetes mellitus with diabetic chronic kidney disease: Secondary | ICD-10-CM

## 2015-11-25 DIAGNOSIS — N186 End stage renal disease: Secondary | ICD-10-CM

## 2015-11-25 DIAGNOSIS — L8990 Pressure ulcer of unspecified site, unspecified stage: Secondary | ICD-10-CM

## 2015-11-25 NOTE — Assessment & Plan Note (Signed)
S/p flagyl. Now on probiotics. Diarrhea has improved.

## 2015-11-25 NOTE — Assessment & Plan Note (Signed)
S/p aggressive HD and SOB decreased.

## 2015-11-25 NOTE — Progress Notes (Signed)
Subjective:   Patient ID: Taylor [name], male    DOB: 23-Mar-1971, 45 y.o.   MRN: [id number]  Taylor [name] is a pleasant 45 y.o. 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  Notes reviewed. Presented to ER with SOB.  CXR confirmed acute CHF that improved with HD.  Antibiotics initially started but stopped once CXR showed no PNA and sats improved after fluid removed with HD. Nephrology was consulted.  C diff positive- was treated with flagyl and probiotics.  Diarrhea has resolved, still has abdominal distension.    Sugars have been running higher but have been better.  Was 125 this morning. Still has intermittent episodes of low blood sugars.  Current Outpatient Prescriptions on File Prior to Visit  Medication Sig Dispense Refill  . acetaminophen (TYLENOL) 500 MG tablet Take 1,000 mg by mouth every 6 (six) hours as needed for mild pain or headache.     [name] alum & mag hydroxide-simeth (MAALOX PLUS) 400-400-40 MG/5ML suspension Take 30 mLs by mouth every 4 (four) hours as needed for indigestion.     [name] amitriptyline (ELAVIL) 25 MG tablet Take 1 tablet (25 mg total) by mouth at bedtime. 30 tablet 1  . aspirin EC 81 MG tablet Take 81 mg by mouth daily.    . calcium-vitamin D (OSCAL WITH D) 250-125 MG-UNIT tablet Take 1 tablet by mouth daily.    . carvedilol (COREG) 25 MG tablet Take 1 tablet (25 mg total) by mouth 2 (two) times daily with a meal. 180 tablet 1  . cinacalcet (SENSIPAR) 30 MG tablet Take 30 mg by mouth daily.    . citalopram (CELEXA) 20 MG tablet Take 20 mg by mouth daily.    . cloNIDine (CATAPRES) 0.1 MG tablet Take 0.1 mg by mouth 2 (two) times daily.    . clopidogrel (PLAVIX) 75 MG tablet Take 1 tablet (75 mg total) by mouth daily. 90 tablet 3  . DHA-EPA-VITAMIN E PO Take  1 capsule by mouth daily.    . furosemide (LASIX) 80 MG tablet Take 80 mg by mouth 2 (two) times daily.    . insulin aspart (NOVOLOG) 100 UNIT/ML injection Inject 0-7 Units into the skin 4 (four) times daily -  before meals and at bedtime. 10 mL 11  . insulin aspart (NOVOLOG) 100 UNIT/ML injection Inject 7 Units into the skin 3 (three) times daily before meals. Reported on 11/15/2015    . insulin glargine (LANTUS) 100 UNIT/ML injection Inject 14 Units into the skin at bedtime.    . isosorbide dinitrate (ISORDIL) 30 MG tablet Take 30 mg by mouth daily.    [name] levothyroxine (SYNTHROID, LEVOTHROID) 75 MCG tablet Take 1 tablet (75 mcg total) by mouth daily before breakfast. 90 tablet 1  . lisinopril (PRINIVIL,ZESTRIL) 10 MG tablet Take 10 mg by mouth daily.    . metolazone (ZAROXOLYN) 5 MG tablet Take 5 mg by mouth daily.    . nitroGLYCERIN (NITROSTAT) 0.4 MG SL tablet Place 0.4 mg under the tongue every 5 (five) minutes as needed for chest pain.    . Omega-3 Fatty Acids (FISH OIL) 1000 MG CAPS Take 1 capsule by mouth daily.    [name] omeprazole (PRILOSEC) 40 MG capsule Take 40 mg by mouth daily.    . pantoprazole (PROTONIX) 40 MG tablet  Take 40 mg by mouth 2 (two) times daily.    . pregabalin (LYRICA) 75 MG capsule Take 75 mg by mouth 2 (two) times daily.     [name] PROAIR HFA 108 (90 Base) MCG/ACT inhaler   1  . Probiotic CAPS Take 1 capsule by mouth daily. 10 capsule 0  . rosuvastatin (CRESTOR) 20 MG tablet Take 1 tablet (20 mg total) by mouth at bedtime. 30 tablet 5  . sevelamer carbonate (RENVELA) 800 MG tablet Take 800 mg by mouth 3 (three) times daily with meals.     . traZODone (DESYREL) 50 MG tablet Take 50 mg by mouth at bedtime.     No current facility-administered medications on file prior to visit.    Allergies  Allergen Reactions  . Amoxicillin-Pot Clavulanate Nausea And Vomiting and Itching  . 2nd Skin Quick Heal Other (See Comments)  . Rifampin Nausea And Vomiting  . Tape Other (See  Comments)    Past Medical History  Diagnosis Date  . End stage renal disease on dialysis (HCC)     LUE fistula  . Type I diabetes mellitus (HCC)     a. 03/2014 admitted with HNK to [facility]. b. TTS  . Diabetic neuropathy (HCC)     severe, s/p multiple toe amputation  . Hypothyroidism   . Hypertension   . Hyperlipidemia   . H/O hiatal hernia   . GERD (gastroesophageal reflux disease)   . Anxiety   . Sebaceous cyst     side of neck  . Pneumonia     2010  . Anemia     a. req PRBC's 2011.  [name] PAD (peripheral artery disease) (HCC)     a. s/p amputation of toes on the right;  b. left LE claudication.  . Coronary artery disease     a. s/p MI;  b. 10/2009 CABG x 3 @ [facility]: LIMA->LAD, VG->OM3, VG->RPDA; c. 11/2010 Cath 3/3 patent grafts;  d 12/2012 Cath: LM 30d, LAD 85p, D1 70, D2 90, LCX 40ost, OM2 100, RCA 90p, 151m, L->LAD ok, VG->OM3 ok, VG->RPDA 30, EF 50%->Med Rx.  . Cataract     right  . Valvular disease     a. 11/2012 Echo: EF 55-60%, mild LVH, mild MR, mild bi-atrial enlargement, mild-mod TR, PASP ; b. echo 03/2014: EF 50-55%, nl WM, select images concerning for bicuspid aortic valve w/ nl thicknes of leaflets, mild MR, mild bi-atrial dilatation, RV mild dilatation - wall thickness nl, mod TR - select images appears to be mod to sev, PASP at least mod elevated.  . Diastolic CHF (HCC)     see echo above  . Depression   . Arthritis     rheumatoid arthritis   . Myocardial infarction (HCC) 2010  . Pulmonary HTN (HCC)     a. continuation of 03/2014 echo PASP @ least mod elevated. RVSP 52 mm Hg. Parasternal long axis estimated @ 85 mm Hg  . Scrotal abscess     a. s/p multiple I&D  . Penile abscess     a. s/p multiple I&D  . Hematemesis     a. EGD 2016 with LA Grade C esophagitis, continued on Protonix   . Chronic respiratory failure (HCC)     a. on 3L oxygen via nasal cannula  . Anxiety   . Acute delirium   . Sepsis (HCC)   . 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 MYOCARDIAL  INFARCTION, HX OF 01/26/2011    Qualifier: Diagnosis of  By: [name]  MD, [name]    . HTN (hypertension) 02/11/2011  . Pulmonary hypertension associated with end stage renal disease on dialysis (HCC) 04/16/2014  . Hyperkalemia   . Hypothyroidism   . Osteomyelitis [facility])     Past Surgical History  Procedure Laterality Date  . Dialysis fistula creation      left upper arm fistula  . Amputation      TOES ON BOTH FEET  . Abscess drainage  Behind right ear/occipital scalp  . Skin graft    . Eye surgery  1999  . Coronary artery bypass graft  10/2009    [facility] (Dr. Katrinka Blazing)  . Foot amputation    . Cardiac catheterization    . Cardiac catheterization  2/14    [facility]: severe 3 vessel CAD with patent grafts, RHC: moderately elevated PCW and pulmonary hypertension  . Penile prosthesis implant N/A 07/24/2014    Procedure: SALINE PENILE INJECTION WITH DISSECTION OF CORPORA ,  IMPLANTATION OF COLOPLAST PENILE PROTHESIS INFLATABLE;  Surgeon: [name], MD;  Location: [facility] ORS;  Service: Urology;  Laterality: N/A;  . Removal of penile prosthesis N/A 08/22/2014    Procedure: EXPLANT OF INFECTED  PENILE PROSTHESIS;  Surgeon: [name], MD;  Location: [facility] ORS;  Service: Urology;  Laterality: N/A;  . Irrigation and debridement abscess Bilateral 12/14/2014    Procedure: Bilateral Corporal Irrigation with Cultures and Drainage, Penrose Drain Insertion;  Surgeon: [name], MD;  Location: [facility] OR;  Service: Urology;  Laterality: Bilateral;  . Scrotal exploration N/A 02/03/2015    Procedure: IRRIGATION AND DEBRIDEMENT SCROTAL/PENILE ABSCESS;  Surgeon: [name], MD;  Location: [facility] OR;  Service: Urology;  Laterality: N/A;  . Cystoscopy N/A 02/03/2015    Procedure: CYSTOSCOPY;  Surgeon: [name], MD;  Location: [facility] OR;  Service: Urology;  Laterality: N/A;  . Esophagogastroduodenoscopy N/A 03/18/2015    Procedure: ESOPHAGOGASTRODUODENOSCOPY (EGD);  Surgeon: [name], MD;  Location: [facility]  ENDOSCOPY;  Service: Endoscopy;  Laterality: N/A;  . Cardiac catheterization N/A 05/27/2015    Procedure: Right Heart Cath;  Surgeon: [name], MD;  Location: [facility] INVASIVE CV LAB;  Service: Cardiovascular;  Laterality: N/A;    Family History  Problem Relation Age of Onset  . Heart disease Other   . Hypertension Other   . Hypertension Mother   . Heart disease Mother   . Diabetes Mother   . Birth defects Paternal Uncle     unaware  . Birth defects Paternal Grandmother     breast  . Kidney disease Neg Hx   . Prostate cancer Neg Hx     Social History   Social History  . Marital Status: Single    Spouse Name: N/A  . Number of Children: N/A  . Years of Education: N/A   Occupational History  . Not on file.   Social History Main Topics  . Smoking status: Never Smoker   . Smokeless tobacco: Former Neurosurgeon    Types: Chew    Quit date: 08/23/1995  . Alcohol Use: No     Comment: occasional beer  . Drug Use: No  . Sexual Activity: Not Currently   Other Topics Concern  . Not on file   Social History Narrative   He is a DNR- yellow sheet given to pt today.   The PMH, PSH, Social History, Family History, Medications, and allergies have been reviewed in [facility], and have been updated if relevant.    Review of Systems  Constitutional: Negative.  Respiratory: Negative.   Gastrointestinal: Positive for abdominal distention. Negative for nausea, vomiting, abdominal pain, diarrhea, constipation, blood in stool, anal bleeding and rectal pain.  Musculoskeletal: Negative.   Skin: Negative.   Hematological: Negative.   Psychiatric/Behavioral: Negative.   All other systems reviewed and are negative.      Objective:    BP 150/74 mmHg  Pulse 79  Temp(Src) 97.9 F (36.6 C) (Oral)  Wt   SpO2 99%   Physical Exam  Constitutional: He is oriented to person, place, and time. He appears well-developed and well-nourished. No distress.  Chronically ill appearing  HENT:  Head:  Normocephalic.  Eyes: Conjunctivae are normal.  Cardiovascular: Normal rate.   Pulmonary/Chest: Effort normal.  Abdominal: Soft. He exhibits distension. He exhibits no mass. There is no tenderness. There is no rebound and no guarding.  Musculoskeletal: Normal range of motion. He exhibits no edema.  Neurological: He is alert and oriented to person, place, and time. No cranial nerve deficit.  Skin: Skin is warm and dry. He is not diaphoretic.  Psychiatric: He has a normal mood and affect. His behavior is normal. Judgment and thought content normal.  Nursing note and vitals reviewed.         Assessment & Plan:   Acute on chronic congestive heart failure with left ventricular diastolic dysfunction (HCC)  Clostridium difficile colitis  Pressure ulcer  End stage renal disease 2/2 to DM ty 1-Dialysis started ~2009--Dialyses t/Th/Sat-[place]  Type 1 DM with end-stage renal disease (HCC) No Follow-up on file.

## 2015-11-25 NOTE — Assessment & Plan Note (Signed)
On HD 

## 2015-11-25 NOTE — Assessment & Plan Note (Signed)
Persistent- likely chronic anasarca from malnutrition and volume overload. KUB today.

## 2015-11-25 NOTE — Progress Notes (Signed)
Pre visit review using our clinic review tool, if applicable. No additional management support is needed unless otherwise documented below in the visit note. 

## 2015-11-27 ENCOUNTER — Other Ambulatory Visit: Payer: Self-pay | Admitting: *Deleted

## 2015-11-27 NOTE — Patient Outreach (Signed)
[facility]) Care Management  11/27/2015  Taylor [name] Oct 30, 1971 [id number]   Telephone call to Taylor [name] to follow up on his blood sugar readings, Patient reports that he is checking his blood sugar at least 4 times a day but he only records it 2 times a day. Patient states his highest blood sugar has been 181 and his lowest has been 49, felt better after treatment and the next time he  checked his blood sugar it was "better". Patient states that Dr.Aron told him to take between 2 to 4 units 3 times a day with meals  and 14 units Lantus at night. I have encouraged Taylor [name] to recheck his blood sugar within 15 minutes after he has eaten when he has had a hypoglycemic episode and continue to document blood sugars.   Plan RNCM will meet patient in home February 10 Patient will notify MD of consistent episodes of low bloods and high blood sugars readings per meter, and 911 for emergencies  [name], RN, [facility] Care Management [phone]- Mobile [phone]- Toll Free Main Office

## 2015-12-18 ENCOUNTER — Encounter: Payer: Self-pay | Admitting: Urology

## 2015-12-18 ENCOUNTER — Ambulatory Visit (INDEPENDENT_AMBULATORY_CARE_PROVIDER_SITE_OTHER): Payer: Medicare Other | Admitting: Urology

## 2015-12-18 VITALS — BP 150/71 | HR 76 | Ht 75.0 in | Wt 200.0 lb

## 2015-12-18 DIAGNOSIS — N4889 Other specified disorders of penis: Secondary | ICD-10-CM | POA: Diagnosis not present

## 2015-12-18 DIAGNOSIS — Z435 Encounter for attention to cystostomy: Secondary | ICD-10-CM | POA: Diagnosis not present

## 2015-12-18 DIAGNOSIS — N360 Urethral fistula: Secondary | ICD-10-CM

## 2015-12-18 NOTE — Progress Notes (Signed)
Suprapubic Cath Change  Patient is present today for a suprapubic catheter change due to urinary retention.  9ml of water was drained from the balloon, a 16FR foley cath was removed from the tract with out difficulty.  Site was cleaned and prepped in a sterile fashion with betadine.  A 16FR foley cath was replaced into the tract no complications were noted. Urine return was noted, 10 ml of sterile water was inflated into the balloon and a leg bag was attached for drainage.  Patient tolerated well. A night bag was given to patient and proper instruction was given on how to switch bags.    Preformed by: Justina Bertini CMA Follow up: One Month 

## 2015-12-18 NOTE — Progress Notes (Signed)
2:50 PM   Taylor [name] 30-Sep-1971 [id number]  Referring provider: Dianne Dun, MD [address] [place], [place] [address]  Chief Complaint  Patient presents with  . Urinary Retention    SPT change    HPI: Taylor [name] is a 45 year old who presents today with his father for SPT exchange.  He states he has not had any with his SPT since his last visit.   He has not had any fevers, gross hematuria or mental status changes.    He was unable to keep his appointment with Taylor [name] at [facility] next due to being hospitalized for pneumonia.  He has not reschedule the appointment.  PMH: Past Medical History  Diagnosis Date  . End stage renal disease on dialysis (HCC)     LUE fistula  . Type I diabetes mellitus (HCC)     a. 03/2014 admitted with HNK to [facility]. b. TTS  . Diabetic neuropathy (HCC)     severe, s/p multiple toe amputation  . Hypothyroidism   . Hypertension   . Hyperlipidemia   . H/O hiatal hernia   . GERD (gastroesophageal reflux disease)   . Anxiety   . Sebaceous cyst     side of neck  . Pneumonia     2010  . Anemia     a. req PRBC's 2011.  [name] PAD (peripheral artery disease) (HCC)     a. s/p amputation of toes on the right;  b. left LE claudication.  . Coronary artery disease     a. s/p MI;  b. 10/2009 CABG x 3 @ [facility]: LIMA->LAD, VG->OM3, VG->RPDA; c. 11/2010 Cath 3/3 patent grafts;  d 12/2012 Cath: LM 30d, LAD 85p, D1 70, D2 90, LCX 40ost, OM2 100, RCA 90p, 155m, L->LAD ok, VG->OM3 ok, VG->RPDA 30, EF 50%->Med Rx.  . Cataract     right  . Valvular disease     a. 11/2012 Echo: EF 55-60%, mild LVH, mild MR, mild bi-atrial enlargement, mild-mod TR, PASP ; b. echo 03/2014: EF 50-55%, nl WM, select images concerning for bicuspid aortic valve w/ nl thicknes of leaflets, mild MR, mild bi-atrial dilatation, RV mild dilatation - wall thickness nl, mod TR - select images appears to be mod to sev, PASP at least mod elevated.  . Diastolic CHF (HCC)     see echo  above  . Depression   . Arthritis     rheumatoid arthritis   . Myocardial infarction (HCC) 2010  . Pulmonary HTN (HCC)     a. continuation of 03/2014 echo PASP @ least mod elevated. RVSP 52 mm Hg. Parasternal long axis estimated @ 85 mm Hg  . Scrotal abscess     a. s/p multiple I&D  . Penile abscess     a. s/p multiple I&D  . Hematemesis     a. EGD 2016 with LA Grade C esophagitis, continued on Protonix   . Chronic respiratory failure (HCC)     a. on 3L oxygen via nasal cannula  . Anxiety   . Acute delirium   . Sepsis (HCC)   . 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 MYOCARDIAL INFARCTION, HX OF 01/26/2011    Qualifier: Diagnosis of  By: [name] MD, [name]    . HTN (hypertension) 02/11/2011  . Pulmonary hypertension associated with end stage renal disease on dialysis (HCC) 04/16/2014  . Hyperkalemia   . Hypothyroidism   . Osteomyelitis [facility])     Surgical History: Past Surgical  History  Procedure Laterality Date  . Dialysis fistula creation      left upper arm fistula  . Amputation      TOES ON BOTH FEET  . Abscess drainage  Behind right ear/occipital scalp  . Skin graft    . Eye surgery  1999  . Coronary artery bypass graft  10/2009    [facility] (Dr. Katrinka Blazing)  . Foot amputation    . Cardiac catheterization    . Cardiac catheterization  2/14    [facility]: severe 3 vessel CAD with patent grafts, RHC: moderately elevated PCW and pulmonary hypertension  . Penile prosthesis implant N/A 07/24/2014    Procedure: SALINE PENILE INJECTION WITH DISSECTION OF CORPORA ,  IMPLANTATION OF COLOPLAST PENILE PROTHESIS INFLATABLE;  Surgeon: [name], MD;  Location: [facility] ORS;  Service: Urology;  Laterality: N/A;  . Removal of penile prosthesis N/A 08/22/2014    Procedure: EXPLANT OF INFECTED  PENILE PROSTHESIS;  Surgeon: [name], MD;  Location: [facility] ORS;  Service: Urology;  Laterality: N/A;  . Irrigation and debridement abscess Bilateral 12/14/2014    Procedure: Bilateral Corporal Irrigation  with Cultures and Drainage, Penrose Drain Insertion;  Surgeon: [name], MD;  Location: [facility] OR;  Service: Urology;  Laterality: Bilateral;  . Scrotal exploration N/A 02/03/2015    Procedure: IRRIGATION AND DEBRIDEMENT SCROTAL/PENILE ABSCESS;  Surgeon: [name], MD;  Location: [facility] OR;  Service: Urology;  Laterality: N/A;  . Cystoscopy N/A 02/03/2015    Procedure: CYSTOSCOPY;  Surgeon: [name], MD;  Location: [facility] OR;  Service: Urology;  Laterality: N/A;  . Esophagogastroduodenoscopy N/A 03/18/2015    Procedure: ESOPHAGOGASTRODUODENOSCOPY (EGD);  Surgeon: [name], MD;  Location: [facility] ENDOSCOPY;  Service: Endoscopy;  Laterality: N/A;  . Cardiac catheterization N/A 05/27/2015    Procedure: Right Heart Cath;  Surgeon: [name], MD;  Location: [facility] INVASIVE CV LAB;  Service: Cardiovascular;  Laterality: N/A;    Home Medications:    Medication List       This list is accurate as of: 12/18/15  2:50 PM.  Always use your most recent med list.               acetaminophen 500 MG tablet  Commonly known as:  TYLENOL  Take 1,000 mg by mouth every 6 (six) hours as needed for mild pain or headache.     alum & mag hydroxide-simeth 400-400-40 MG/5ML suspension  Commonly known as:  MAALOX PLUS  Take 30 mLs by mouth every 4 (four) hours as needed for indigestion.     amitriptyline 25 MG tablet  Commonly known as:  ELAVIL  Take 1 tablet (25 mg total) by mouth at bedtime.     aspirin EC 81 MG tablet  Take 81 mg by mouth daily.     calcium-vitamin D 250-125 MG-UNIT tablet  Commonly known as:  OSCAL WITH D  Take 1 tablet by mouth daily.     carvedilol 25 MG tablet  Commonly known as:  COREG  Take 1 tablet (25 mg total) by mouth 2 (two) times daily with a meal.     cinacalcet 30 MG tablet  Commonly known as:  SENSIPAR  Take 30 mg by mouth daily.     citalopram 20 MG tablet  Commonly known as:  CELEXA  Take 20 mg by mouth daily.     cloNIDine 0.1 MG tablet    Commonly known as:  CATAPRES  Take 0.1 mg by mouth 2 (two) times daily.  clopidogrel 75 MG tablet  Commonly known as:  PLAVIX  Take 1 tablet (75 mg total) by mouth daily.     DHA-EPA-VITAMIN E PO  Take 1 capsule by mouth daily.     Fish Oil 1000 MG Caps  Take 1 capsule by mouth daily.     furosemide 80 MG tablet  Commonly known as:  LASIX  Take 80 mg by mouth 2 (two) times daily.     insulin aspart 100 UNIT/ML injection  Commonly known as:  novoLOG  Inject 7 Units into the skin 3 (three) times daily before meals. Reported on 12/18/2015     insulin aspart 100 UNIT/ML injection  Commonly known as:  novoLOG  Inject 0-7 Units into the skin 4 (four) times daily -  before meals and at bedtime.     insulin glargine 100 UNIT/ML injection  Commonly known as:  LANTUS  Inject 14 Units into the skin at bedtime.     isosorbide dinitrate 30 MG tablet  Commonly known as:  ISORDIL  Take 30 mg by mouth daily.     levothyroxine 75 MCG tablet  Commonly known as:  SYNTHROID, LEVOTHROID  Take 1 tablet (75 mcg total) by mouth daily before breakfast.     lisinopril 10 MG tablet  Commonly known as:  PRINIVIL,ZESTRIL  Take 10 mg by mouth daily.     metolazone 5 MG tablet  Commonly known as:  ZAROXOLYN  Take 5 mg by mouth daily.     metronidazole 375 MG capsule  Commonly known as:  FLAGYL  Take 375 mg by mouth 2 (two) times daily.     nitroGLYCERIN 0.4 MG SL tablet  Commonly known as:  NITROSTAT  Place 0.4 mg under the tongue every 5 (five) minutes as needed for chest pain.     omeprazole 40 MG capsule  Commonly known as:  PRILOSEC  Take 40 mg by mouth daily.     OXYGEN  Inhale into the lungs.     pantoprazole 40 MG tablet  Commonly known as:  PROTONIX  Take 40 mg by mouth 2 (two) times daily.     pregabalin 75 MG capsule  Commonly known as:  LYRICA  Take 75 mg by mouth 2 (two) times daily.     PROAIR HFA 108 (90 Base) MCG/ACT inhaler  Generic drug:  albuterol      Probiotic Caps  Take 1 capsule by mouth daily.     rosuvastatin 20 MG tablet  Commonly known as:  CRESTOR  Take 1 tablet (20 mg total) by mouth at bedtime.     sevelamer carbonate 800 MG tablet  Commonly known as:  RENVELA  Take 800 mg by mouth 3 (three) times daily with meals.     traZODone 50 MG tablet  Commonly known as:  DESYREL  Take 50 mg by mouth at bedtime.        Allergies:  Allergies  Allergen Reactions  . Amoxicillin-Pot Clavulanate Nausea And Vomiting and Itching  . 2nd Skin Quick Heal Other (See Comments)  . Rifampin Nausea And Vomiting  . Tape Other (See Comments)    Family History: Family History  Problem Relation Age of Onset  . Heart disease Other   . Hypertension Other   . Hypertension Mother   . Heart disease Mother   . Diabetes Mother   . Birth defects Paternal Uncle     unaware  . Birth defects Paternal Grandmother     breast  . Kidney disease  Neg Hx   . Prostate cancer Neg Hx     Social History:  reports that he has never smoked. He quit smokeless tobacco use about 20 years ago. His smokeless tobacco use included Chew. He reports that he does not drink alcohol or use illicit drugs.  ROS: UROLOGY Frequent Urination?: No Hard to postpone urination?: No Burning/pain with urination?: No Get up at night to urinate?: No Leakage of urine?: No Urine stream starts and stops?: No Trouble starting stream?: No Do you have to strain to urinate?: No Blood in urine?: Yes Urinary tract infection?: No Sexually transmitted disease?: No Injury to kidneys or bladder?: No Painful intercourse?: No Weak stream?: No Erection problems?: No Penile pain?: No  Gastrointestinal Nausea?: No Vomiting?: No Indigestion/heartburn?: No Diarrhea?: Yes Constipation?: No  Constitutional Fever: No Night sweats?: No Weight loss?: No Fatigue?: No  Skin Skin rash/lesions?: No Itching?: No  Eyes Blurred vision?: No Double vision?:  No  Ears/Nose/Throat Sore throat?: No Sinus problems?: No  Hematologic/Lymphatic Swollen glands?: No Easy bruising?: Yes  Cardiovascular Leg swelling?: Yes Chest pain?: No  Respiratory Cough?: No Shortness of breath?: No  Endocrine Excessive thirst?: No  Musculoskeletal Back pain?: Yes Joint pain?: Yes  Neurological Headaches?: No Dizziness?: No  Psychologic Depression?: No Anxiety?: No  Physical Exam: Blood pressure 170/88, pulse 80, resp. rate 16. Constitutional: Well nourished. Alert and oriented, No acute distress. HEENT: [place] AT, moist mucus membranes. Trachea midline, no masses. Cardiovascular: No clubbing, cyanosis, or edema. Respiratory: Normal respiratory effort, no increased work of breathing. GI: Abdomen with ascites.  SPT site clean and dry.  No erythema.   GU: No CVA tenderness.  No bladder fullness or masses.  Penoscrotal exam clinically improved from previous occasions, no penile edema, no erythema, no drainage from left distal shaft opening with manipulation. Meatus is widely patent from previous debridement.  Scrotum without lesions, cysts, rashes and/or edema.  Testicles are located scrotally bilaterally. No masses are appreciated in the testicles. Left and right epididymis are normal. Skin: Skin breakdown in the sacral area. Lymph: No cervical or inguinal adenopathy. Neurologic: Grossly intact, no focal deficits, moving all 4 extremities. Psychiatric: Normal mood and affect.     Laboratory Data: Lab Results  Component Value Date   WBC 8.5 10/17/2015   HGB 8.2* 10/17/2015   HCT 26.3* 10/17/2015   MCV 90.8 10/17/2015   PLT 114* 10/17/2015    Lab Results  Component Value Date   CREATININE 4.13* 10/17/2015   CREATININE 3.96* 10/17/2015    Lab Results  Component Value Date   HGBA1C 9.4* 03/04/2015    Assessment & Plan:    1. 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  We will refer to the wound clinic for possible hyperbaric chamber treatment.  2. SPT exchange: SPT was exchanged.  He will return in 1 month time for his next SPT exchange.   Return for refer to the wound clinic.  Michiel Cowboy, PA-C  [facility] [address], [address] [place], [place] [address] [phone]

## 2015-12-20 ENCOUNTER — Ambulatory Visit: Payer: Self-pay | Admitting: *Deleted

## 2015-12-26 ENCOUNTER — Ambulatory Visit (INDEPENDENT_AMBULATORY_CARE_PROVIDER_SITE_OTHER): Payer: Medicare Other | Admitting: Internal Medicine

## 2015-12-26 ENCOUNTER — Ambulatory Visit (INDEPENDENT_AMBULATORY_CARE_PROVIDER_SITE_OTHER)
Admission: RE | Admit: 2015-12-26 | Discharge: 2015-12-26 | Disposition: A | Discharge status: Home / Self Care | Payer: Medicare Other | Source: Ambulatory Visit | Attending: Internal Medicine | Admitting: Internal Medicine

## 2015-12-26 ENCOUNTER — Telehealth: Payer: Self-pay | Admitting: Family Medicine

## 2015-12-26 ENCOUNTER — Encounter: Payer: Self-pay | Admitting: Internal Medicine

## 2015-12-26 VITALS — BP 130/70 | HR 71 | Temp 97.8°F

## 2015-12-26 DIAGNOSIS — M79642 Pain in left hand: Secondary | ICD-10-CM | POA: Diagnosis not present

## 2015-12-26 DIAGNOSIS — M25532 Pain in left wrist: Secondary | ICD-10-CM

## 2015-12-26 DIAGNOSIS — M25539 Pain in unspecified wrist: Secondary | ICD-10-CM | POA: Insufficient documentation

## 2015-12-26 NOTE — Progress Notes (Signed)
Pre visit review using our clinic review tool, if applicable. No additional management support is needed unless otherwise documented below in the visit note. 

## 2015-12-26 NOTE — Progress Notes (Signed)
Subjective:    Patient ID: Taylor [name], male    DOB: 06/29/1971, 45 y.o.   MRN: [id number]  HPI Here with dad due to 2 days of left hand/wrist pain  No known injury Doesn't propel in his wheelchair or any other regular severe use Mild swelling No fever Not really red  Current Outpatient Prescriptions on File Prior to Visit  Medication Sig Dispense Refill  . acetaminophen (TYLENOL) 500 MG tablet Take 1,000 mg by mouth every 6 (six) hours as needed for mild pain or headache.     [name] alum & mag hydroxide-simeth (MAALOX PLUS) 400-400-40 MG/5ML suspension Take 30 mLs by mouth every 4 (four) hours as needed for indigestion.     [name] amitriptyline (ELAVIL) 25 MG tablet Take 1 tablet (25 mg total) by mouth at bedtime. 30 tablet 1  . aspirin EC 81 MG tablet Take 81 mg by mouth daily.    . calcium-vitamin D (OSCAL WITH D) 250-125 MG-UNIT tablet Take 1 tablet by mouth daily.    . carvedilol (COREG) 25 MG tablet Take 1 tablet (25 mg total) by mouth 2 (two) times daily with a meal. 180 tablet 1  . cinacalcet (SENSIPAR) 30 MG tablet Take 30 mg by mouth daily.    . citalopram (CELEXA) 20 MG tablet Take 20 mg by mouth daily.    . cloNIDine (CATAPRES) 0.1 MG tablet Take 0.1 mg by mouth 2 (two) times daily.    . clopidogrel (PLAVIX) 75 MG tablet Take 1 tablet (75 mg total) by mouth daily. 90 tablet 3  . DHA-EPA-VITAMIN E PO Take 1 capsule by mouth daily.    . furosemide (LASIX) 80 MG tablet Take 80 mg by mouth 2 (two) times daily.    . insulin aspart (NOVOLOG) 100 UNIT/ML injection Inject 0-7 Units into the skin 4 (four) times daily -  before meals and at bedtime. 10 mL 11  . insulin glargine (LANTUS) 100 UNIT/ML injection Inject 14 Units into the skin at bedtime.    . isosorbide dinitrate (ISORDIL) 30 MG tablet Take 30 mg by mouth daily.    [name] levothyroxine (SYNTHROID, LEVOTHROID) 75 MCG tablet Take 1 tablet (75 mcg total) by mouth daily before breakfast. 90 tablet 1  . lisinopril  (PRINIVIL,ZESTRIL) 10 MG tablet Take 10 mg by mouth daily.    . metolazone (ZAROXOLYN) 5 MG tablet Take 5 mg by mouth daily.    . metronidazole (FLAGYL) 375 MG capsule Take 375 mg by mouth 2 (two) times daily.    . nitroGLYCERIN (NITROSTAT) 0.4 MG SL tablet Place 0.4 mg under the tongue every 5 (five) minutes as needed for chest pain.    . Omega-3 Fatty Acids (FISH OIL) 1000 MG CAPS Take 1 capsule by mouth daily.    [name] omeprazole (PRILOSEC) 40 MG capsule Take 40 mg by mouth daily.    . OXYGEN Inhale into the lungs.    . pantoprazole (PROTONIX) 40 MG tablet Take 40 mg by mouth 2 (two) times daily.    . pregabalin (LYRICA) 75 MG capsule Take 75 mg by mouth 2 (two) times daily.     [name] PROAIR HFA 108 (90 Base) MCG/ACT inhaler   1  . Probiotic CAPS Take 1 capsule by mouth daily. 10 capsule 0  . rosuvastatin (CRESTOR) 20 MG tablet Take 1 tablet (20 mg total) by mouth at bedtime. 30 tablet 5  . sevelamer carbonate (RENVELA) 800 MG tablet Take 800 mg by mouth 3 (three) times daily with  meals.     . traZODone (DESYREL) 50 MG tablet Take 50 mg by mouth at bedtime.     No current facility-administered medications on file prior to visit.    Allergies  Allergen Reactions  . Amoxicillin-Pot Clavulanate Nausea And Vomiting and Itching  . 2nd Skin Quick Heal Other (See Comments)  . Rifampin Nausea And Vomiting  . Tape Other (See Comments)    Past Medical History  Diagnosis Date  . End stage renal disease on dialysis (HCC)     LUE fistula  . Type I diabetes mellitus (HCC)     a. 03/2014 admitted with HNK to [facility]. b. TTS  . Diabetic neuropathy (HCC)     severe, s/p multiple toe amputation  . Hypothyroidism   . Hypertension   . Hyperlipidemia   . H/O hiatal hernia   . GERD (gastroesophageal reflux disease)   . Anxiety   . Sebaceous cyst     side of neck  . Pneumonia     2010  . Anemia     a. req PRBC's 2011.  [name] PAD (peripheral artery disease) (HCC)     a. s/p amputation of toes on the right;   b. left LE claudication.  . Coronary artery disease     a. s/p MI;  b. 10/2009 CABG x 3 @ [facility]: LIMA->LAD, VG->OM3, VG->RPDA; c. 11/2010 Cath 3/3 patent grafts;  d 12/2012 Cath: LM 30d, LAD 85p, D1 70, D2 90, LCX 40ost, OM2 100, RCA 90p, 111m, L->LAD ok, VG->OM3 ok, VG->RPDA 30, EF 50%->Med Rx.  . Cataract     right  . Valvular disease     a. 11/2012 Echo: EF 55-60%, mild LVH, mild MR, mild bi-atrial enlargement, mild-mod TR, PASP ; b. echo 03/2014: EF 50-55%, nl WM, select images concerning for bicuspid aortic valve w/ nl thicknes of leaflets, mild MR, mild bi-atrial dilatation, RV mild dilatation - wall thickness nl, mod TR - select images appears to be mod to sev, PASP at least mod elevated.  . Diastolic CHF (HCC)     see echo above  . Depression   . Arthritis     rheumatoid arthritis   . Myocardial infarction (HCC) 2010  . Pulmonary HTN (HCC)     a. continuation of 03/2014 echo PASP @ least mod elevated. RVSP 52 mm Hg. Parasternal long axis estimated @ 85 mm Hg  . Scrotal abscess     a. s/p multiple I&D  . Penile abscess     a. s/p multiple I&D  . Hematemesis     a. EGD 2016 with LA Grade C esophagitis, continued on Protonix   . Chronic respiratory failure (HCC)     a. on 3L oxygen via nasal cannula  . Anxiety   . Acute delirium   . Sepsis (HCC)   . 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 MYOCARDIAL INFARCTION, HX OF 01/26/2011    Qualifier: Diagnosis of  By: [name] MD, [name]    . HTN (hypertension) 02/11/2011  . Pulmonary hypertension associated with end stage renal disease on dialysis (HCC) 04/16/2014  . Hyperkalemia   . Hypothyroidism   . Osteomyelitis [facility])     Past Surgical History  Procedure Laterality Date  . Dialysis fistula creation      left upper arm fistula  . Amputation      TOES ON BOTH FEET  . Abscess drainage  Behind right ear/occipital scalp  . Skin graft    . Eye surgery  1999  .  Coronary artery bypass graft  10/2009    [facility] (Dr. Katrinka Blazing)  . Foot amputation     . Cardiac catheterization    . Cardiac catheterization  2/14    [facility]: severe 3 vessel CAD with patent grafts, RHC: moderately elevated PCW and pulmonary hypertension  . Penile prosthesis implant N/A 07/24/2014    Procedure: SALINE PENILE INJECTION WITH DISSECTION OF CORPORA ,  IMPLANTATION OF COLOPLAST PENILE PROTHESIS INFLATABLE;  Surgeon: [name], MD;  Location: [facility] ORS;  Service: Urology;  Laterality: N/A;  . Removal of penile prosthesis N/A 08/22/2014    Procedure: EXPLANT OF INFECTED  PENILE PROSTHESIS;  Surgeon: [name], MD;  Location: [facility] ORS;  Service: Urology;  Laterality: N/A;  . Irrigation and debridement abscess Bilateral 12/14/2014    Procedure: Bilateral Corporal Irrigation with Cultures and Drainage, Penrose Drain Insertion;  Surgeon: [name], MD;  Location: [facility] OR;  Service: Urology;  Laterality: Bilateral;  . Scrotal exploration N/A 02/03/2015    Procedure: IRRIGATION AND DEBRIDEMENT SCROTAL/PENILE ABSCESS;  Surgeon: [name], MD;  Location: [facility] OR;  Service: Urology;  Laterality: N/A;  . Cystoscopy N/A 02/03/2015    Procedure: CYSTOSCOPY;  Surgeon: [name], MD;  Location: [facility] OR;  Service: Urology;  Laterality: N/A;  . Esophagogastroduodenoscopy N/A 03/18/2015    Procedure: ESOPHAGOGASTRODUODENOSCOPY (EGD);  Surgeon: [name], MD;  Location: [facility] ENDOSCOPY;  Service: Endoscopy;  Laterality: N/A;  . Cardiac catheterization N/A 05/27/2015    Procedure: Right Heart Cath;  Surgeon: [name], MD;  Location: [facility] INVASIVE CV LAB;  Service: Cardiovascular;  Laterality: N/A;    Family History  Problem Relation Age of Onset  . Heart disease Other   . Hypertension Other   . Hypertension Mother   . Heart disease Mother   . Diabetes Mother   . Birth defects Paternal Uncle     unaware  . Birth defects Paternal Grandmother     breast  . Kidney disease Neg Hx   . Prostate cancer Neg Hx     Social History   Social History    . Marital Status: Single    Spouse Name: N/A  . Number of Children: N/A  . Years of Education: N/A   Occupational History  . Not on file.   Social History Main Topics  . Smoking status: Never Smoker   . Smokeless tobacco: Former Neurosurgeon    Types: Chew    Quit date: 08/23/1995  . Alcohol Use: No     Comment: occasional beer  . Drug Use: No  . Sexual Activity: Not Currently   Other Topics Concern  . Not on file   Social History Narrative   He is a DNR- yellow sheet given to pt today.   Review of Systems Appetite has been okay Sugars are fine Sleeping okay    Objective:   Physical Exam  Musculoskeletal:  Puffiness of dorsum of left hand Pain with movement of fingers but no distinct finger and metacarpal tenderness Wrist is slightly tender but has some passive ROM  Elbow is normal--no forearm tenderness till the wrist          Assessment & Plan:

## 2015-12-26 NOTE — Assessment & Plan Note (Signed)
And hand No trauma Some pain on exam so x-rays checked--fortunately negative ?related to position with dialysis????  Will just give splint--this gave him good support and relief of discomfort

## 2015-12-26 NOTE — Telephone Encounter (Signed)
See OV note.  

## 2015-12-26 NOTE — Telephone Encounter (Signed)
Patient Name: Taylor [name]  DOB: Feb 16, 1971    Initial Comment Caller States thinks he fractured his wrist. swollen and hurts. has fractured it before.    Nurse Assessment  Nurse: Sherilyn Cooter, RN, Thurmond Butts Date/Time Lamount Cohen Time): 12/26/2015 1:28:30 PM  Confirm and document reason for call. If symptomatic, describe symptoms. You must click the next button to save text entered. ---Caller states that he thinks he fractured his left wrist. It is painful and swollen. He does not know how this may have happened. He first noticed the pain and swelling on Tuesday. He rates his pain as 9-10 on 0-10 scale with movement. He has some left over Hydrocodone and took one. It :helped a little bit but not much." He states that his hand is always numb due to neuropathy.  Has the patient traveled out of the country within the last 30 days? ---Not Applicable  Does the patient have any new or worsening symptoms? ---Yes  Will a triage be completed? ---Yes  Related visit to physician within the last 2 weeks? ---No  Does the PT have any chronic conditions? (i.e. diabetes, asthma, etc.) ---Yes  List chronic conditions. ---Diabetes, CHF, HTN, Hypercholesterolemia, on Dialysis  Is this a behavioral health or substance abuse call? ---No     Guidelines    Guideline Title Affirmed Question Affirmed Notes  Hand and Wrist Pain [1] MODERATE pain (e.g., interferes with normal activities) AND [2] present > 3 days    Final Disposition User   See PCP When Office is Open (within 3 days) Sherilyn Cooter, RN, Thurmond Butts    Comments  Caller asked to be seen today. Dr. [name] did not have appointments available for today. I was able to schedule him with Dr. Alphonsus Sias for today at 3:45pm.   Referrals  REFERRED TO PCP OFFICE   Disagree/Comply: Comply

## 2015-12-26 NOTE — Telephone Encounter (Signed)
Pt has appt 12/26/15 at 3:45 with Dr Alphonsus Sias.

## 2016-01-01 ENCOUNTER — Other Ambulatory Visit: Payer: Self-pay | Admitting: *Deleted

## 2016-01-01 ENCOUNTER — Encounter: Payer: Self-pay | Admitting: *Deleted

## 2016-01-01 NOTE — Patient Outreach (Signed)
[facility]) Care Management   01/02/2016  Taylor [name] [birth year] [id number]  Taylor [name] is an 45 y.o. male  Subjective: Patient discussed his recent office visit, related to left wrist pain and swelling.  Objective:   BP 120/60 mmHg  Pulse 82  Resp 18  SpO2 99% Review of Systems  Constitutional: Negative.   HENT: Negative.   Respiratory: Negative.   Cardiovascular: Positive for leg swelling.       Bilateral lower extremities swollen tight  Gastrointestinal: Negative.   Genitourinary:       Supra pubic catheter   Musculoskeletal: Positive for joint pain and falls.       Left wrist pain-  Skin: Negative.        Dry skin  Neurological: Positive for dizziness.       Occasional dizziness when changing positions quickly  Psychiatric/Behavioral: Negative.  Negative for depression and memory loss. The patient is not nervous/anxious.     Physical Exam  Constitutional: He is oriented to person, place, and time. He appears well-developed. He appears cachectic. He appears ill.  Cardiovascular: Normal rate, regular rhythm and normal heart sounds.   Left upper arm graft with bruit and thrill  Respiratory: Breath sounds normal.  GI: Soft. Bowel sounds are normal.    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 Patient wearing splint to left wrist, left hand with some swelling.  Neurological: He is alert and oriented to person, place, and time.  Skin: Skin is warm and dry.     Callus on the bottom of right foot, skin dry both feet.  Psychiatric: He has a normal mood and affect. His behavior is normal. Judgment and thought content normal.    Current Medications:   Current Outpatient Prescriptions  Medication Sig Dispense Refill  . acetaminophen (TYLENOL) 500 MG tablet Take 1,000 mg by mouth every 6 (six) hours as needed for mild pain or headache.     [name] amitriptyline (ELAVIL)  25 MG tablet Take 1 tablet (25 mg total) by mouth at bedtime. 30 tablet 1  . aspirin EC 81 MG tablet Take 81 mg by mouth daily.    . calcium-vitamin D (OSCAL WITH D) 250-125 MG-UNIT tablet Take 1 tablet by mouth daily.    . carvedilol (COREG) 25 MG tablet Take 1 tablet (25 mg total) by mouth 2 (two) times daily with a meal. 180 tablet 1  . cinacalcet (SENSIPAR) 30 MG tablet Take 30 mg by mouth daily.    . citalopram (CELEXA) 20 MG tablet Take 20 mg by mouth daily.    . cloNIDine (CATAPRES) 0.1 MG tablet Take 0.1 mg by mouth 2 (two) times daily.    . clopidogrel (PLAVIX) 75 MG tablet Take 1 tablet (75 mg total) by mouth daily. 90 tablet 3  . DHA-EPA-VITAMIN E PO Take 1 capsule by mouth daily.    . furosemide (LASIX) 80 MG tablet Take 80 mg by mouth 2 (two) times daily.    [name] ibuprofen (ADVIL,MOTRIN) 200 MG tablet Take 200 mg by mouth every 6 (six) hours as needed. Patient states he only takes 2 a day    . insulin aspart (NOVOLOG) 100 UNIT/ML injection Inject 0-7 Units into the skin 4 (four) times daily -  before meals and at bedtime. 10 mL 11  . insulin glargine (LANTUS) 100 UNIT/ML injection Inject 14 Units into the skin at bedtime.    [name]  isosorbide dinitrate (ISORDIL) 30 MG tablet Take 30 mg by mouth daily.    [name] levothyroxine (SYNTHROID, LEVOTHROID) 75 MCG tablet Take 1 tablet (75 mcg total) by mouth daily before breakfast. 90 tablet 1  . lisinopril (PRINIVIL,ZESTRIL) 10 MG tablet Take 10 mg by mouth daily.    . metolazone (ZAROXOLYN) 5 MG tablet Take 5 mg by mouth daily.    . metronidazole (FLAGYL) 375 MG capsule Take 375 mg by mouth 2 (two) times daily.    . nitroGLYCERIN (NITROSTAT) 0.4 MG SL tablet Place 0.4 mg under the tongue every 5 (five) minutes as needed for chest pain.    . Omega-3 Fatty Acids (FISH OIL) 1000 MG CAPS Take 1 capsule by mouth daily.    [name] omeprazole (PRILOSEC) 40 MG capsule Take 40 mg by mouth daily.    . OXYGEN Inhale into the lungs.    . pantoprazole (PROTONIX) 40 MG  tablet Take 40 mg by mouth 2 (two) times daily.    . pregabalin (LYRICA) 75 MG capsule Take 75 mg by mouth 2 (two) times daily.     [name] PROAIR HFA 108 (90 Base) MCG/ACT inhaler   1  . Probiotic CAPS Take 1 capsule by mouth daily. 10 capsule 0  . rosuvastatin (CRESTOR) 20 MG tablet Take 1 tablet (20 mg total) by mouth at bedtime. 30 tablet 5  . sevelamer carbonate (RENVELA) 800 MG tablet Take 800 mg by mouth 3 (three) times daily with meals.     . traZODone (DESYREL) 50 MG tablet Take 50 mg by mouth at bedtime.    [name] alum & mag hydroxide-simeth (MAALOX PLUS) 400-400-40 MG/5ML suspension Take 30 mLs by mouth every 4 (four) hours as needed for indigestion.      No current facility-administered medications for this visit.    Functional Status:   In your present state of health, do you have any difficulty performing the following activities: 01/02/2016 11/06/2015  Hearing? N -  Vision? N -  Difficulty concentrating or making decisions? N -  Walking or climbing stairs? Y -  Dressing or bathing? Y -  Doing errands, shopping? Y -  Conservation officer, nature and eating ? Y -  Using the Toilet? N -  In the past six months, have you accidently leaked urine? Y -  Do you have problems with loss of bowel control? N -  Managing your Medications? N (No Data)  Managing your Finances? Y -  Housekeeping or managing your Housekeeping? Y -   Fall Risk  11/20/2015 10/28/2015 04/09/2014  Falls in the past year? Yes Yes Yes  Number falls in past yr: 2 or more 2 or more 2 or more  Injury with Fall? Yes No -  Risk Factor Category  High Fall Risk High Fall Risk -  Risk for fall due to : History of fall(s);Impaired balance/gait History of fall(s);Impaired balance/gait -  Follow up Falls prevention discussed - -   Fall/Depression Screening:    PHQ 2/9 Scores 11/20/2015 10/28/2015 04/09/2014  PHQ - 2 Score 1 1 0  PHQ- 9 Score - 4 -    Assessment:  Routine home visit, patient resting in his recliner chair, patient's father  Garnet Koyanagi, caregiver present as well as patients disabled mom. Mr.Kravitz father continues to help him with daily medication, ADL's as needed and transportation.   Left Wrist pain Patient complaint of left wrist swelling and pain, recent MD office visit and has been wearing a splint to wrist which patient states that it  helps some. Some swelling noted to left wrist , encouraged to elevate as tolerated, and patient releases splint a few times during the day. Mr. Gettel states he takes 2 ibuprofen a day for discomfort with some relief.  Heart Failure Patient does not weight at home due to difficulty balancing on scales, and doesn't want to risk falling, he weighs 3 days a week at hemodialysis, and states he was at his dry weight at therapy on Tuesday.Patient with bilateral lower leg edema, legs tight,skin intact. Patient continues to wear his oxygen continuous and denies shortness of breath., patient states he tries to stick to his fluid restriction limit.  Diabetes Patient with the help of his father has been keeping a record of blood sugars, patient denies having recent blood sugar less than 70, lowest reading 74 and highest 364. Patient states he feels better when his blood sugar is running around 200, he discussed his long history with type 1 Diabetes and know what feels good for him. Patient reports decreased appetite at times, educated on small meal/snack throughout the day. Patient discussed his nutritionist at the Dialysis center states he needs to increase his protein intake, Educated patient and father on protein benefits and options,   Plan:  Patient will continue to monitor and record blood sugars and notify PCP increased blood sugars greater than 300 Patient will increase nutrition intake RNCM will schedule home visit on March 24  [facility] CM Care Plan Problem One        Most Recent Value   Care Plan Problem One  Recent Hosptial Admission, High risk for readmission   Role Documenting the  Problem One  Care Management Export for Problem One  Active   [facility] Long Term Goal (31-90 days)  Patient will not experience a hospital admission in the next 31 days   [facility] Long Term Goal Start Date  10/28/15   [facility] Long Term Goal Met Date  11/27/15   [facility] CM Short Term Goal #1 (0-30 days)  Patient will not experience a hospital admisson in the next 30 days   [facility] CM Short Term Goal #1 Start Date  10/28/15   [facility] CM Short Term Goal #1 Met Date  11/27/15   [facility] CM Short Term Goal #2 (0-30 days)  Patient will make an appointment with PCP for follow up in the next 2 weeks   [facility] CM Short Term Goal #2 Start Date  10/28/15   [facility] CM Short Term Goal #2 Met Date  11/15/15   [facility] CM Short Term Goal #3 (0-30 days)  Patient will obtain a CBG meter in the next 7 days    [facility] CM Short Term Goal #3 Start Date  11/20/15   [facility] CM Short Term Goal #3 Met Date  11/27/15    [facility] CM Care Plan Problem Two        Most Recent Value   Care Plan Problem Two  Nutrition concerns   Role Documenting the Problem Two  Care Management Coordinator   Care Plan for Problem Two  Active   Interventions for Problem Two Long Term Goal   Educated patient on the benefits of protein in the diet, and rationale how balanced diet benefits   [facility] Long Term Goal (31-90) days  Patient to verbalize eating a increased  balanced diet and increase protein in diet in the next 60 days   [facility] Long Term Goal Start Date  01/02/16   [facility] CM Short Term  Goal #1 (0-30 days)  Patient will be able to identify protein foods to include in diet in the next 30 days   [facility] CM Short Term Goal #1 Start Date  01/01/16   Interventions for Short Term Goal #2   Educated on various proteins that he can include in his diet, discussed his likes, recommended eating small meals throughout the day.      Joylene Draft, RN, [place] [facility] [phone]- Mobile [phone]- Toll Free Main Office .

## 2016-01-07 ENCOUNTER — Inpatient Hospital Stay
Admission: EM | Admit: 2016-01-07 | Discharge: 2016-01-16 | DRG: 698 | Disposition: A | Discharge status: Skilled Nursing Facility | Payer: Medicare Other | Attending: Internal Medicine | Admitting: Internal Medicine

## 2016-01-07 ENCOUNTER — Emergency Department: Payer: Medicare Other

## 2016-01-07 ENCOUNTER — Encounter: Payer: Self-pay | Admitting: Emergency Medicine

## 2016-01-07 DIAGNOSIS — I452 Bifascicular block: Secondary | ICD-10-CM | POA: Diagnosis present

## 2016-01-07 DIAGNOSIS — R200 Anesthesia of skin: Secondary | ICD-10-CM

## 2016-01-07 DIAGNOSIS — Z89421 Acquired absence of other right toe(s): Secondary | ICD-10-CM

## 2016-01-07 DIAGNOSIS — E876 Hypokalemia: Secondary | ICD-10-CM | POA: Diagnosis not present

## 2016-01-07 DIAGNOSIS — I2510 Atherosclerotic heart disease of native coronary artery without angina pectoris: Secondary | ICD-10-CM | POA: Diagnosis present

## 2016-01-07 DIAGNOSIS — Z978 Presence of other specified devices: Secondary | ICD-10-CM

## 2016-01-07 DIAGNOSIS — Z960 Presence of urogenital implants: Secondary | ICD-10-CM

## 2016-01-07 DIAGNOSIS — Z1611 Resistance to penicillins: Secondary | ICD-10-CM | POA: Diagnosis present

## 2016-01-07 DIAGNOSIS — Z79899 Other long term (current) drug therapy: Secondary | ICD-10-CM | POA: Diagnosis not present

## 2016-01-07 DIAGNOSIS — D631 Anemia in chronic kidney disease: Secondary | ICD-10-CM | POA: Diagnosis present

## 2016-01-07 DIAGNOSIS — A419 Sepsis, unspecified organism: Secondary | ICD-10-CM | POA: Diagnosis present

## 2016-01-07 DIAGNOSIS — I132 Hypertensive heart and chronic kidney disease with heart failure and with stage 5 chronic kidney disease, or end stage renal disease: Secondary | ICD-10-CM | POA: Diagnosis present

## 2016-01-07 DIAGNOSIS — G9340 Encephalopathy, unspecified: Secondary | ICD-10-CM | POA: Diagnosis not present

## 2016-01-07 DIAGNOSIS — R6521 Severe sepsis with septic shock: Secondary | ICD-10-CM | POA: Diagnosis present

## 2016-01-07 DIAGNOSIS — R680 Hypothermia, not associated with low environmental temperature: Secondary | ICD-10-CM | POA: Diagnosis present

## 2016-01-07 DIAGNOSIS — M4802 Spinal stenosis, cervical region: Secondary | ICD-10-CM | POA: Diagnosis present

## 2016-01-07 DIAGNOSIS — G8929 Other chronic pain: Secondary | ICD-10-CM | POA: Diagnosis present

## 2016-01-07 DIAGNOSIS — Z7401 Bed confinement status: Secondary | ICD-10-CM | POA: Diagnosis not present

## 2016-01-07 DIAGNOSIS — M069 Rheumatoid arthritis, unspecified: Secondary | ICD-10-CM | POA: Diagnosis present

## 2016-01-07 DIAGNOSIS — K219 Gastro-esophageal reflux disease without esophagitis: Secondary | ICD-10-CM | POA: Diagnosis present

## 2016-01-07 DIAGNOSIS — Z4659 Encounter for fitting and adjustment of other gastrointestinal appliance and device: Secondary | ICD-10-CM

## 2016-01-07 DIAGNOSIS — Z8249 Family history of ischemic heart disease and other diseases of the circulatory system: Secondary | ICD-10-CM | POA: Diagnosis not present

## 2016-01-07 DIAGNOSIS — L97509 Non-pressure chronic ulcer of other part of unspecified foot with unspecified severity: Secondary | ICD-10-CM

## 2016-01-07 DIAGNOSIS — Y92009 Unspecified place in unspecified non-institutional (private) residence as the place of occurrence of the external cause: Secondary | ICD-10-CM

## 2016-01-07 DIAGNOSIS — Y731 Therapeutic (nonsurgical) and rehabilitative gastroenterology and urology devices associated with adverse incidents: Secondary | ICD-10-CM | POA: Diagnosis present

## 2016-01-07 DIAGNOSIS — E1040 Type 1 diabetes mellitus with diabetic neuropathy, unspecified: Secondary | ICD-10-CM | POA: Diagnosis present

## 2016-01-07 DIAGNOSIS — E1022 Type 1 diabetes mellitus with diabetic chronic kidney disease: Secondary | ICD-10-CM | POA: Diagnosis present

## 2016-01-07 DIAGNOSIS — B9620 Unspecified Escherichia coli [E. coli] as the cause of diseases classified elsewhere: Secondary | ICD-10-CM | POA: Diagnosis present

## 2016-01-07 DIAGNOSIS — I9589 Other hypotension: Secondary | ICD-10-CM

## 2016-01-07 DIAGNOSIS — Z9981 Dependence on supplemental oxygen: Secondary | ICD-10-CM | POA: Diagnosis not present

## 2016-01-07 DIAGNOSIS — M4806 Spinal stenosis, lumbar region: Secondary | ICD-10-CM | POA: Diagnosis present

## 2016-01-07 DIAGNOSIS — A4151 Sepsis due to Escherichia coli [E. coli]: Secondary | ICD-10-CM | POA: Diagnosis present

## 2016-01-07 DIAGNOSIS — Z880 Allergy status to penicillin: Secondary | ICD-10-CM

## 2016-01-07 DIAGNOSIS — J9621 Acute and chronic respiratory failure with hypoxia: Secondary | ICD-10-CM | POA: Diagnosis present

## 2016-01-07 DIAGNOSIS — D696 Thrombocytopenia, unspecified: Secondary | ICD-10-CM | POA: Diagnosis present

## 2016-01-07 DIAGNOSIS — R29898 Other symptoms and signs involving the musculoskeletal system: Secondary | ICD-10-CM

## 2016-01-07 DIAGNOSIS — J9601 Acute respiratory failure with hypoxia: Secondary | ICD-10-CM | POA: Diagnosis not present

## 2016-01-07 DIAGNOSIS — E785 Hyperlipidemia, unspecified: Secondary | ICD-10-CM | POA: Diagnosis present

## 2016-01-07 DIAGNOSIS — Z7902 Long term (current) use of antithrombotics/antiplatelets: Secondary | ICD-10-CM

## 2016-01-07 DIAGNOSIS — N390 Urinary tract infection, site not specified: Secondary | ICD-10-CM | POA: Diagnosis present

## 2016-01-07 DIAGNOSIS — Z794 Long term (current) use of insulin: Secondary | ICD-10-CM | POA: Diagnosis not present

## 2016-01-07 DIAGNOSIS — Z951 Presence of aortocoronary bypass graft: Secondary | ICD-10-CM

## 2016-01-07 DIAGNOSIS — Z66 Do not resuscitate: Secondary | ICD-10-CM | POA: Diagnosis present

## 2016-01-07 DIAGNOSIS — R42 Dizziness and giddiness: Secondary | ICD-10-CM

## 2016-01-07 DIAGNOSIS — T83518A Infection and inflammatory reaction due to other urinary catheter, initial encounter: Principal | ICD-10-CM | POA: Diagnosis present

## 2016-01-07 DIAGNOSIS — N186 End stage renal disease: Secondary | ICD-10-CM | POA: Diagnosis present

## 2016-01-07 DIAGNOSIS — E10319 Type 1 diabetes mellitus with unspecified diabetic retinopathy without macular edema: Secondary | ICD-10-CM | POA: Diagnosis present

## 2016-01-07 DIAGNOSIS — D638 Anemia in other chronic diseases classified elsewhere: Secondary | ICD-10-CM

## 2016-01-07 DIAGNOSIS — A498 Other bacterial infections of unspecified site: Secondary | ICD-10-CM

## 2016-01-07 DIAGNOSIS — I5032 Chronic diastolic (congestive) heart failure: Secondary | ICD-10-CM | POA: Diagnosis present

## 2016-01-07 DIAGNOSIS — I272 Other secondary pulmonary hypertension: Secondary | ICD-10-CM | POA: Diagnosis present

## 2016-01-07 DIAGNOSIS — Z89422 Acquired absence of other left toe(s): Secondary | ICD-10-CM

## 2016-01-07 DIAGNOSIS — I252 Old myocardial infarction: Secondary | ICD-10-CM

## 2016-01-07 DIAGNOSIS — R519 Headache, unspecified: Secondary | ICD-10-CM

## 2016-01-07 DIAGNOSIS — M48061 Spinal stenosis, lumbar region without neurogenic claudication: Secondary | ICD-10-CM

## 2016-01-07 DIAGNOSIS — G8220 Paraplegia, unspecified: Secondary | ICD-10-CM | POA: Diagnosis not present

## 2016-01-07 DIAGNOSIS — N2581 Secondary hyperparathyroidism of renal origin: Secondary | ICD-10-CM | POA: Diagnosis present

## 2016-01-07 DIAGNOSIS — E10621 Type 1 diabetes mellitus with foot ulcer: Secondary | ICD-10-CM

## 2016-01-07 DIAGNOSIS — R531 Weakness: Secondary | ICD-10-CM | POA: Diagnosis present

## 2016-01-07 DIAGNOSIS — Z7982 Long term (current) use of aspirin: Secondary | ICD-10-CM | POA: Diagnosis not present

## 2016-01-07 DIAGNOSIS — M5126 Other intervertebral disc displacement, lumbar region: Secondary | ICD-10-CM | POA: Diagnosis present

## 2016-01-07 DIAGNOSIS — Z992 Dependence on renal dialysis: Secondary | ICD-10-CM

## 2016-01-07 DIAGNOSIS — R51 Headache: Secondary | ICD-10-CM

## 2016-01-07 DIAGNOSIS — Z833 Family history of diabetes mellitus: Secondary | ICD-10-CM | POA: Diagnosis not present

## 2016-01-07 DIAGNOSIS — R338 Other retention of urine: Secondary | ICD-10-CM

## 2016-01-07 DIAGNOSIS — R0902 Hypoxemia: Secondary | ICD-10-CM

## 2016-01-07 LAB — GLUCOSE, CAPILLARY
GLUCOSE-CAPILLARY: 140 mg/dL — AB (ref 65–99)
GLUCOSE-CAPILLARY: 81 mg/dL (ref 65–99)
GLUCOSE-CAPILLARY: 96 mg/dL (ref 65–99)
Glucose-Capillary: 120 mg/dL — ABNORMAL HIGH (ref 65–99)
Glucose-Capillary: 154 mg/dL — ABNORMAL HIGH (ref 65–99)

## 2016-01-07 LAB — CBC
HCT: 27.8 % — ABNORMAL LOW (ref 40.0–52.0)
HEMOGLOBIN: 8.6 g/dL — AB (ref 13.0–18.0)
MCH: 27.7 pg (ref 26.0–34.0)
MCHC: 30.9 g/dL — AB (ref 32.0–36.0)
MCV: 89.7 fL (ref 80.0–100.0)
PLATELETS: 116 10*3/uL — AB (ref 150–440)
RBC: 3.1 MIL/uL — ABNORMAL LOW (ref 4.40–5.90)
RDW: 16.9 % — ABNORMAL HIGH (ref 11.5–14.5)
WBC: 16.8 10*3/uL — ABNORMAL HIGH (ref 3.8–10.6)

## 2016-01-07 LAB — COMPREHENSIVE METABOLIC PANEL
ALBUMIN: 2.2 g/dL — AB (ref 3.5–5.0)
ALK PHOS: 900 U/L — AB (ref 38–126)
ALT: 10 U/L — ABNORMAL LOW (ref 17–63)
ANION GAP: 15 (ref 5–15)
AST: 21 U/L (ref 15–41)
BUN: 52 mg/dL — ABNORMAL HIGH (ref 6–20)
CALCIUM: 8 mg/dL — AB (ref 8.9–10.3)
CHLORIDE: 95 mmol/L — AB (ref 101–111)
CO2: 22 mmol/L (ref 22–32)
Creatinine, Ser: 5.13 mg/dL — ABNORMAL HIGH (ref 0.61–1.24)
GFR calc Af Amer: 14 mL/min — ABNORMAL LOW (ref >60)
GFR calc non Af Amer: 12 mL/min — ABNORMAL LOW (ref >60)
GLUCOSE: 82 mg/dL (ref 65–99)
POTASSIUM: 3.4 mmol/L — AB (ref 3.5–5.1)
SODIUM: 132 mmol/L — AB (ref 135–145)
Total Bilirubin: 0.8 mg/dL (ref 0.3–1.2)
Total Protein: 6.4 g/dL — ABNORMAL LOW (ref 6.5–8.1)

## 2016-01-07 LAB — URINALYSIS COMPLETE WITH MICROSCOPIC (ARMC ONLY)
Bilirubin Urine: NEGATIVE
Glucose, UA: 50 mg/dL — AB
KETONES UR: NEGATIVE mg/dL
NITRITE: NEGATIVE
PROTEIN: 100 mg/dL — AB
SPECIFIC GRAVITY, URINE: 1.016 (ref 1.005–1.030)
Squamous Epithelial / LPF: NONE SEEN
pH: 5 (ref 5.0–8.0)

## 2016-01-07 LAB — LACTIC ACID, PLASMA
Lactic Acid, Venous: 2.9 mmol/L (ref 0.5–2.0)
Lactic Acid, Venous: 2.4 mmol/L (ref 0.5–2.0)

## 2016-01-07 LAB — TROPONIN I: Troponin I: 0.08 ng/mL — ABNORMAL HIGH (ref <0.031)

## 2016-01-07 LAB — MRSA PCR SCREENING: MRSA by PCR: NEGATIVE

## 2016-01-07 MED ORDER — HYDROCODONE-ACETAMINOPHEN 5-325 MG PO TABS
1.0000 | ORAL_TABLET | ORAL | Status: DC | PRN
  Administered 2016-01-07 – 2016-01-10 (×4): 1 via ORAL
  Administered 2016-01-11 – 2016-01-12 (×2): 2 via ORAL
  Administered 2016-01-13 – 2016-01-14 (×3): 1 via ORAL
  Administered 2016-01-16: 2 via ORAL
  Filled 2016-01-07: qty 1
  Filled 2016-01-07: qty 2
  Filled 2016-01-07: qty 1
  Filled 2016-01-07: qty 2
  Filled 2016-01-07: qty 1
  Filled 2016-01-07: qty 2
  Filled 2016-01-07: qty 1
  Filled 2016-01-07 (×2): qty 2
  Filled 2016-01-07: qty 1

## 2016-01-07 MED ORDER — ACETAMINOPHEN 325 MG PO TABS
650.0000 mg | ORAL_TABLET | Freq: Four times a day (QID) | ORAL | Status: DC | PRN
  Administered 2016-01-09: 650 mg via ORAL
  Filled 2016-01-07: qty 2

## 2016-01-07 MED ORDER — PANTOPRAZOLE SODIUM 40 MG PO TBEC
40.0000 mg | DELAYED_RELEASE_TABLET | Freq: Every day | ORAL | Status: DC
  Administered 2016-01-07 – 2016-01-16 (×8): 40 mg via ORAL
  Filled 2016-01-07 (×8): qty 1

## 2016-01-07 MED ORDER — CLOPIDOGREL BISULFATE 75 MG PO TABS
75.0000 mg | ORAL_TABLET | Freq: Every day | ORAL | Status: DC
  Administered 2016-01-07 – 2016-01-14 (×7): 75 mg via ORAL
  Filled 2016-01-07 (×7): qty 1

## 2016-01-07 MED ORDER — CITALOPRAM HYDROBROMIDE 20 MG PO TABS
20.0000 mg | ORAL_TABLET | Freq: Every day | ORAL | Status: DC
  Administered 2016-01-07 – 2016-01-16 (×8): 20 mg via ORAL
  Filled 2016-01-07 (×8): qty 1

## 2016-01-07 MED ORDER — VANCOMYCIN HCL IN DEXTROSE 1-5 GM/200ML-% IV SOLN
1000.0000 mg | Freq: Once | INTRAVENOUS | Status: AC
  Administered 2016-01-07: 1000 mg via INTRAVENOUS
  Filled 2016-01-07: qty 200

## 2016-01-07 MED ORDER — ROSUVASTATIN CALCIUM 10 MG PO TABS
20.0000 mg | ORAL_TABLET | Freq: Every day | ORAL | Status: DC
  Administered 2016-01-07 – 2016-01-16 (×10): 20 mg via ORAL
  Filled 2016-01-07 (×2): qty 2
  Filled 2016-01-07: qty 1
  Filled 2016-01-07 (×5): qty 2
  Filled 2016-01-07: qty 1
  Filled 2016-01-07: qty 2

## 2016-01-07 MED ORDER — DEXTROSE 5 % IV SOLN
500.0000 mg | Freq: Two times a day (BID) | INTRAVENOUS | Status: DC
  Filled 2016-01-07: qty 0.5

## 2016-01-07 MED ORDER — LEVOTHYROXINE SODIUM 75 MCG PO TABS
75.0000 ug | ORAL_TABLET | Freq: Every day | ORAL | Status: DC
  Administered 2016-01-09 – 2016-01-16 (×8): 75 ug via ORAL
  Filled 2016-01-07 (×8): qty 1

## 2016-01-07 MED ORDER — SODIUM CHLORIDE 0.9 % IV BOLUS (SEPSIS)
1000.0000 mL | INTRAVENOUS | Status: DC
  Administered 2016-01-07: 1000 mL via INTRAVENOUS

## 2016-01-07 MED ORDER — LEVOFLOXACIN IN D5W 750 MG/150ML IV SOLN
750.0000 mg | Freq: Once | INTRAVENOUS | Status: AC
  Administered 2016-01-07: 750 mg via INTRAVENOUS
  Filled 2016-01-07: qty 150

## 2016-01-07 MED ORDER — MORPHINE SULFATE (PF) 2 MG/ML IV SOLN
1.0000 mg | INTRAVENOUS | Status: DC | PRN
  Administered 2016-01-08 – 2016-01-15 (×3): 1 mg via INTRAVENOUS
  Filled 2016-01-07 (×3): qty 1

## 2016-01-07 MED ORDER — INSULIN ASPART 100 UNIT/ML ~~LOC~~ SOLN: 1.0000 [IU] | SUBCUTANEOUS | Status: DC

## 2016-01-07 MED ORDER — ONDANSETRON HCL 4 MG/2ML IJ SOLN: 4.0000 mg | Freq: Four times a day (QID) | INTRAMUSCULAR | Status: DC | PRN

## 2016-01-07 MED ORDER — SODIUM CHLORIDE 0.9 % IV BOLUS (SEPSIS): 500.0000 mL | INTRAVENOUS | Status: DC

## 2016-01-07 MED ORDER — DOCUSATE SODIUM 100 MG PO CAPS
100.0000 mg | ORAL_CAPSULE | Freq: Two times a day (BID) | ORAL | Status: DC
  Administered 2016-01-07 – 2016-01-16 (×14): 100 mg via ORAL
  Filled 2016-01-07 (×15): qty 1

## 2016-01-07 MED ORDER — ONDANSETRON HCL 4 MG PO TABS: 4.0000 mg | ORAL_TABLET | Freq: Four times a day (QID) | ORAL | Status: DC | PRN

## 2016-01-07 MED ORDER — INSULIN GLARGINE 100 UNIT/ML ~~LOC~~ SOLN
8.0000 [IU] | Freq: Every day | SUBCUTANEOUS | Status: DC
  Administered 2016-01-07 – 2016-01-15 (×9): 8 [IU] via SUBCUTANEOUS
  Filled 2016-01-07 (×12): qty 0.08

## 2016-01-07 MED ORDER — SODIUM CHLORIDE 0.9 % IV SOLN
INTRAVENOUS | Status: DC
  Administered 2016-01-07 – 2016-01-14 (×7): via INTRAVENOUS

## 2016-01-07 MED ORDER — CINACALCET HCL 30 MG PO TABS
30.0000 mg | ORAL_TABLET | Freq: Every day | ORAL | Status: DC
  Administered 2016-01-07 – 2016-01-12 (×5): 30 mg via ORAL
  Filled 2016-01-07 (×4): qty 1

## 2016-01-07 MED ORDER — AZTREONAM 2 G IJ SOLR
2.0000 g | Freq: Once | INTRAMUSCULAR | Status: AC
  Administered 2016-01-07: 2 g via INTRAVENOUS
  Filled 2016-01-07: qty 2

## 2016-01-07 MED ORDER — PREGABALIN 75 MG PO CAPS
75.0000 mg | ORAL_CAPSULE | Freq: Two times a day (BID) | ORAL | Status: DC
  Administered 2016-01-07 – 2016-01-16 (×17): 75 mg via ORAL
  Filled 2016-01-07 (×17): qty 1

## 2016-01-07 MED ORDER — VANCOMYCIN HCL IN DEXTROSE 750-5 MG/150ML-% IV SOLN
750.0000 mg | Freq: Once | INTRAVENOUS | Status: AC
Start: 2016-01-07 — End: 2016-01-07
  Administered 2016-01-07: 750 mg via INTRAVENOUS
  Filled 2016-01-07: qty 150

## 2016-01-07 MED ORDER — VANCOMYCIN HCL IN DEXTROSE 750-5 MG/150ML-% IV SOLN
750.0000 mg | INTRAVENOUS | Status: DC
  Filled 2016-01-07: qty 150

## 2016-01-07 MED ORDER — SODIUM CHLORIDE 0.9 % IV BOLUS (SEPSIS)
1000.0000 mL | INTRAVENOUS | Status: AC
Start: 2016-01-07 — End: 2016-01-07

## 2016-01-07 MED ORDER — NALOXONE HCL 2 MG/2ML IJ SOSY
0.4000 mg | PREFILLED_SYRINGE | INTRAMUSCULAR | Status: DC | PRN
  Administered 2016-01-07: 0.4 mg via INTRAVENOUS
  Filled 2016-01-07 (×2): qty 2

## 2016-01-07 MED ORDER — OMEGA-3-ACID ETHYL ESTERS 1 G PO CAPS
1.0000 g | ORAL_CAPSULE | Freq: Every day | ORAL | Status: DC
  Administered 2016-01-07 – 2016-01-16 (×8): 1 g via ORAL
  Filled 2016-01-07 (×8): qty 1

## 2016-01-07 MED ORDER — NOREPINEPHRINE BITARTRATE 1 MG/ML IV SOLN
0.0000 ug/min | INTRAVENOUS | Status: DC
  Administered 2016-01-07: 2 ug/min via INTRAVENOUS
  Administered 2016-01-09: 1 ug/min via INTRAVENOUS
  Filled 2016-01-07 (×2): qty 4

## 2016-01-07 MED ORDER — CALCIUM CARBONATE-VITAMIN D 500-200 MG-UNIT PO TABS
1.0000 | ORAL_TABLET | Freq: Every day | ORAL | Status: DC
  Administered 2016-01-07 – 2016-01-16 (×8): 1 via ORAL
  Filled 2016-01-07 (×8): qty 1

## 2016-01-07 MED ORDER — INSULIN ASPART 100 UNIT/ML ~~LOC~~ SOLN
1.0000 [IU] | SUBCUTANEOUS | Status: DC
  Administered 2016-01-07: 1 [IU] via SUBCUTANEOUS
  Administered 2016-01-07: 2 [IU] via SUBCUTANEOUS
  Administered 2016-01-08 (×2): 1 [IU] via SUBCUTANEOUS
  Administered 2016-01-08: 2 [IU] via SUBCUTANEOUS
  Administered 2016-01-09 – 2016-01-10 (×6): 1 [IU] via SUBCUTANEOUS
  Administered 2016-01-10: 2 [IU] via SUBCUTANEOUS
  Administered 2016-01-10: 3 [IU] via SUBCUTANEOUS
  Administered 2016-01-10: 2 [IU] via SUBCUTANEOUS
  Administered 2016-01-11: 1 [IU] via SUBCUTANEOUS
  Administered 2016-01-11: 2 [IU] via SUBCUTANEOUS
  Administered 2016-01-11 (×2): 1 [IU] via SUBCUTANEOUS
  Administered 2016-01-12: 2 [IU] via SUBCUTANEOUS
  Administered 2016-01-13 – 2016-01-14 (×4): 1 [IU] via SUBCUTANEOUS
  Administered 2016-01-15 – 2016-01-16 (×4): 2 [IU] via SUBCUTANEOUS
  Filled 2016-01-07 (×2): qty 1
  Filled 2016-01-07 (×5): qty 2
  Filled 2016-01-07 (×4): qty 1
  Filled 2016-01-07: qty 2
  Filled 2016-01-07 (×2): qty 1
  Filled 2016-01-07: qty 2
  Filled 2016-01-07 (×5): qty 1
  Filled 2016-01-07: qty 2
  Filled 2016-01-07 (×4): qty 1
  Filled 2016-01-07: qty 3
  Filled 2016-01-07: qty 1
  Filled 2016-01-07: qty 2

## 2016-01-07 MED ORDER — HEPARIN SODIUM (PORCINE) 5000 UNIT/ML IJ SOLN
5000.0000 [IU] | Freq: Three times a day (TID) | INTRAMUSCULAR | Status: DC
  Administered 2016-01-07 – 2016-01-14 (×20): 5000 [IU] via SUBCUTANEOUS
  Filled 2016-01-07 (×20): qty 1

## 2016-01-07 MED ORDER — SEVELAMER CARBONATE 800 MG PO TABS
800.0000 mg | ORAL_TABLET | Freq: Three times a day (TID) | ORAL | Status: DC
  Administered 2016-01-07 – 2016-01-16 (×18): 800 mg via ORAL
  Filled 2016-01-07 (×16): qty 1

## 2016-01-07 MED ORDER — ACETAMINOPHEN 650 MG RE SUPP
650.0000 mg | Freq: Four times a day (QID) | RECTAL | Status: DC | PRN
  Administered 2016-01-08: 650 mg via RECTAL
  Filled 2016-01-07: qty 1

## 2016-01-07 MED ORDER — DEXTROSE 5 % IV SOLN
2.0000 g | INTRAVENOUS | Status: DC
  Administered 2016-01-07 – 2016-01-09 (×3): 2 g via INTRAVENOUS
  Filled 2016-01-07 (×5): qty 2

## 2016-01-07 MED ORDER — TRAZODONE HCL 50 MG PO TABS
50.0000 mg | ORAL_TABLET | Freq: Every day | ORAL | Status: DC
  Administered 2016-01-07 – 2016-01-16 (×10): 50 mg via ORAL
  Filled 2016-01-07 (×10): qty 1

## 2016-01-07 MED ORDER — ASPIRIN EC 81 MG PO TBEC
81.0000 mg | DELAYED_RELEASE_TABLET | Freq: Every day | ORAL | Status: DC
  Administered 2016-01-07 – 2016-01-16 (×8): 81 mg via ORAL
  Filled 2016-01-07 (×8): qty 1

## 2016-01-07 MED ORDER — LEVOFLOXACIN IN D5W 500 MG/100ML IV SOLN: 500.0000 mg | INTRAVENOUS | Status: DC

## 2016-01-07 MED ORDER — SODIUM CHLORIDE 0.9 % IV BOLUS (SEPSIS)
500.0000 mL | Freq: Once | INTRAVENOUS | Status: AC
  Administered 2016-01-07: 500 mL via INTRAVENOUS

## 2016-01-07 NOTE — H&P (Signed)
[facility] - [place] at [facility]   PATIENT NAME: Taylor [name]    MR#:  [id number]  DATE OF BIRTH:  1971-01-28  DATE OF ADMISSION:  01/07/2016  PRIMARY CARE PHYSICIAN: [name], MD   REQUESTING/REFERRING PHYSICIAN: Dr Darnelle Catalan  CHIEF COMPLAINT:  General malaise weakness not feeling well.  HISTORY OF PRESENT ILLNESS:  Taylor [name]  is a 45 y.o. 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 Patient states since his last catheter replacement his urine output has declined. He's been started feeling weak for last 1 week today slumped over while his dad was trying to get him to dialysis. He was brought to the emergency room where he was found hypothermic with temperature of 96 5 blood pressure in the 70s and 80s systolic and elevated white count of 16.8. "Sepsis was initiated. Workup in the emergency room revealed his source likely is a urine although urinalysis to pending. His Foley catheter bag has small amount of urine which looks frank pus. Again urinalysis still pending. Patient is being admitted with sepsis likely source urine. In the emergency room he received vancomycin and Levaquin and aztreonam. His map is around 60. We'll start him on levophed.  PAST MEDICAL HISTORY:   Past Medical History  Diagnosis Date  . End stage renal disease on dialysis (HCC)     LUE fistula  . Type I diabetes mellitus (HCC)     a. 03/2014 admitted with HNK to [facility]. b. TTS  . Diabetic neuropathy (HCC)     severe, s/p multiple toe amputation  . Hypothyroidism   . Hypertension   . Hyperlipidemia   . H/O hiatal hernia   . GERD (gastroesophageal reflux  disease)   . Anxiety   . Sebaceous cyst     side of neck  . Pneumonia     2010  . Anemia     a. req PRBC's 2011.  [name] PAD (peripheral artery disease) (HCC)     a. s/p amputation of toes on the right;  b. left LE claudication.  . Coronary artery disease     a. s/p MI;  b. 10/2009 CABG x 3 @ [facility]: LIMA->LAD, VG->OM3, VG->RPDA; c. 11/2010 Cath 3/3 patent grafts;  d 12/2012 Cath: LM 30d, LAD 85p, D1 70, D2 90, LCX 40ost, OM2 100, RCA 90p, 165m, L->LAD ok, VG->OM3 ok, VG->RPDA 30, EF 50%->Med Rx.  . Cataract     right  . Valvular disease     a. 11/2012 Echo: EF 55-60%, mild LVH, mild MR, mild bi-atrial enlargement, mild-mod TR, PASP ; b. echo 03/2014: EF 50-55%, nl WM, select images concerning for bicuspid aortic valve w/ nl thicknes of leaflets, mild MR, mild bi-atrial dilatation, RV mild dilatation - wall thickness nl, mod TR - select images appears to be mod to sev, PASP at least mod elevated.  . Diastolic CHF (HCC)     see echo above  . Depression   . Arthritis     rheumatoid arthritis   . Myocardial infarction (HCC) 2010  . Pulmonary HTN (HCC)     a. continuation of 03/2014 echo PASP @ least mod elevated. RVSP 52 mm Hg. Parasternal long axis estimated @ 85 mm Hg  . Scrotal abscess  a. s/p multiple I&D  . Penile abscess     a. s/p multiple I&D  . Hematemesis     a. EGD 2016 with LA Grade C esophagitis, continued on Protonix   . Chronic respiratory failure (HCC)     a. on 3L oxygen via nasal cannula  . Anxiety   . Acute delirium   . Sepsis (HCC)   . 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 MYOCARDIAL INFARCTION, HX OF 01/26/2011    Qualifier: Diagnosis of  By: [name] MD, [name]    . HTN (hypertension) 02/11/2011  . Pulmonary hypertension associated with end stage renal disease on dialysis (HCC) 04/16/2014  . Hyperkalemia   . Hypothyroidism   . Osteomyelitis (HCC)     PAST SURGICAL HISTOIRY:   Past Surgical History  Procedure Laterality Date  . Dialysis fistula creation      left  upper arm fistula  . Amputation      TOES ON BOTH FEET  . Abscess drainage  Behind right ear/occipital scalp  . Skin graft    . Eye surgery  1999  . Coronary artery bypass graft  10/2009    [facility] (Dr. Katrinka Blazing)  . Foot amputation    . Cardiac catheterization    . Cardiac catheterization  2/14    [facility]: severe 3 vessel CAD with patent grafts, RHC: moderately elevated PCW and pulmonary hypertension  . Penile prosthesis implant N/A 07/24/2014    Procedure: SALINE PENILE INJECTION WITH DISSECTION OF CORPORA ,  IMPLANTATION OF COLOPLAST PENILE PROTHESIS INFLATABLE;  Surgeon: [name], MD;  Location: [facility] ORS;  Service: Urology;  Laterality: N/A;  . Removal of penile prosthesis N/A 08/22/2014    Procedure: EXPLANT OF INFECTED  PENILE PROSTHESIS;  Surgeon: [name], MD;  Location: [facility] ORS;  Service: Urology;  Laterality: N/A;  . Irrigation and debridement abscess Bilateral 12/14/2014    Procedure: Bilateral Corporal Irrigation with Cultures and Drainage, Penrose Drain Insertion;  Surgeon: [name], MD;  Location: [facility] OR;  Service: Urology;  Laterality: Bilateral;  . Scrotal exploration N/A 02/03/2015    Procedure: IRRIGATION AND DEBRIDEMENT SCROTAL/PENILE ABSCESS;  Surgeon: [name], MD;  Location: [facility] OR;  Service: Urology;  Laterality: N/A;  . Cystoscopy N/A 02/03/2015    Procedure: CYSTOSCOPY;  Surgeon: [name], MD;  Location: [facility] OR;  Service: Urology;  Laterality: N/A;  . Esophagogastroduodenoscopy N/A 03/18/2015    Procedure: ESOPHAGOGASTRODUODENOSCOPY (EGD);  Surgeon: [name], MD;  Location: [facility] ENDOSCOPY;  Service: Endoscopy;  Laterality: N/A;  . Cardiac catheterization N/A 05/27/2015    Procedure: Right Heart Cath;  Surgeon: [name], MD;  Location: [facility] INVASIVE CV LAB;  Service: Cardiovascular;  Laterality: N/A;    SOCIAL HISTORY:   Social History  Substance Use Topics  . Smoking status: Never [name]   . Smokeless tobacco: Former  Neurosurgeon    Types: Chew    Quit date: 08/23/1995  . Alcohol Use: No     Comment: occasional beer    FAMILY HISTORY:   Family History  Problem Relation Age of Onset  . Heart disease Other   . Hypertension Other   . Hypertension Mother   . Heart disease Mother   . Diabetes Mother   . Birth defects Paternal Uncle     unaware  . Birth defects Paternal Grandmother     breast  . Kidney disease Neg Hx   . Prostate cancer Neg Hx     DRUG ALLERGIES:  Allergies  Allergen Reactions  . Amoxicillin-Pot Clavulanate Nausea And Vomiting and Itching  . 2nd Skin Quick Heal Other (See Comments)  . Rifampin Nausea And Vomiting  . Tape Other (See Comments)    REVIEW OF SYSTEMS:  Review of Systems  Constitutional: Positive for weight loss. Negative for fever, chills and diaphoresis.  HENT: Negative for congestion, ear pain, hearing loss, nosebleeds and sore throat.   Eyes: Negative for blurred vision, double vision, photophobia and pain.  Respiratory: Negative for hemoptysis, sputum production, wheezing and stridor.   Cardiovascular: Negative for orthopnea, claudication and leg swelling.  Gastrointestinal: Negative for heartburn and abdominal pain.  Genitourinary: Negative for dysuria and frequency.  Musculoskeletal: Negative for back pain, joint pain and neck pain.  Skin: Negative for rash.  Neurological: Positive for weakness and headaches. Negative for tingling, sensory change, speech change, focal weakness and seizures.  Endo/Heme/Allergies: Does not bruise/bleed easily.  Psychiatric/Behavioral: Negative for suicidal ideas, memory loss and substance abuse. The patient is not nervous/anxious.      MEDICATIONS AT HOME:   Prior to Admission medications   Medication Sig Start Date End Date Taking? Authorizing Provider  acetaminophen (TYLENOL) 500 MG tablet Take 1,000 mg by mouth every 6 (six) hours as needed for mild pain or headache.    Yes Historical Provider, MD  alum & mag  hydroxide-simeth (MAALOX PLUS) 400-400-40 MG/5ML suspension Take 30 mLs by mouth every 4 (four) hours as needed for indigestion.    Yes Historical Provider, MD  amitriptyline (ELAVIL) 25 MG tablet Take 1 tablet (25 mg total) by mouth at bedtime. 08/21/14  Yes Dianne Dun, MD  aspirin EC 81 MG tablet Take 81 mg by mouth daily.   Yes Historical Provider, MD  calcium-vitamin D (OSCAL WITH D) 250-125 MG-UNIT tablet Take 1 tablet by mouth daily.   Yes Historical Provider, MD  carvedilol (COREG) 25 MG tablet Take 1 tablet (25 mg total) by mouth 2 (two) times daily with a meal. 08/28/15  Yes [name], MD  cinacalcet (SENSIPAR) 30 MG tablet Take 30 mg by mouth daily.   Yes Historical Provider, MD  citalopram (CELEXA) 20 MG tablet Take 20 mg by mouth daily.   Yes Historical Provider, MD  cloNIDine (CATAPRES) 0.1 MG tablet Take 0.1 mg by mouth 2 (two) times daily.   Yes Historical Provider, MD  clopidogrel (PLAVIX) 75 MG tablet Take 1 tablet (75 mg total) by mouth daily. 09/11/15  Yes Dianne Dun, MD  DHA-EPA-VITAMIN E PO Take 1 capsule by mouth daily.   Yes Historical Provider, MD  furosemide (LASIX) 80 MG tablet Take 80 mg by mouth 2 (two) times daily.   Yes Historical Provider, MD  ibuprofen (ADVIL,MOTRIN) 200 MG tablet Take 200 mg by mouth every 6 (six) hours as needed. Patient states he only takes 2 a day   Yes Historical Provider, MD  insulin aspart (NOVOLOG) 100 UNIT/ML injection Inject 0-7 Units into the skin 4 (four) times daily -  before meals and at bedtime. Patient taking differently: Inject 4 Units into the skin 3 (three) times daily. If blood sugar is high pt. Will take up to 7 units if needed. 03/12/15  Yes Altamese Dilling, MD  insulin glargine (LANTUS) 100 UNIT/ML injection Inject 14 Units into the skin at bedtime.   Yes Historical Provider, MD  isosorbide dinitrate (ISORDIL) 30 MG tablet Take 30 mg by mouth daily.   Yes Historical Provider, MD  levothyroxine (SYNTHROID,  LEVOTHROID) 75 MCG tablet Take  1 tablet (75 mcg total) by mouth daily before breakfast. 07/26/13  Yes Dianne Dun, MD  lisinopril (PRINIVIL,ZESTRIL) 10 MG tablet Take 10 mg by mouth daily.   Yes Historical Provider, MD  metolazone (ZAROXOLYN) 5 MG tablet Take 5 mg by mouth daily.   Yes Historical Provider, MD  metronidazole (FLAGYL) 375 MG capsule Take 375 mg by mouth 2 (two) times daily.   Yes Historical Provider, MD  nitroGLYCERIN (NITROSTAT) 0.4 MG SL tablet Place 0.4 mg under the tongue every 5 (five) minutes as needed for chest pain.   Yes Historical Provider, MD  Omega-3 Fatty Acids (FISH OIL) 1000 MG CAPS Take 1 capsule by mouth daily.   Yes Historical Provider, MD  omeprazole (PRILOSEC) 40 MG capsule Take 40 mg by mouth daily.   Yes Historical Provider, MD  OXYGEN Inhale into the lungs.   Yes Historical Provider, MD  pantoprazole (PROTONIX) 40 MG tablet Take 40 mg by mouth 2 (two) times daily.   Yes Historical Provider, MD  pregabalin (LYRICA) 75 MG capsule Take 75 mg by mouth 2 (two) times daily.    Yes Historical Provider, MD  PROAIR HFA 108 (90 Base) MCG/ACT inhaler Inhale 1-2 puffs into the lungs every 6 (six) hours as needed.  10/08/15  Yes Historical Provider, MD  Probiotic CAPS Take 1 capsule by mouth daily. 10/15/15  Yes Enid Baas, MD  rosuvastatin (CRESTOR) 20 MG tablet Take 1 tablet (20 mg total) by mouth at bedtime. 12/13/12  Yes Dianne Dun, MD  sevelamer carbonate (RENVELA) 800 MG tablet Take 800 mg by mouth 3 (three) times daily with meals.    Yes Historical Provider, MD  traZODone (DESYREL) 50 MG tablet Take 50 mg by mouth at bedtime.   Yes Historical Provider, MD      VITAL SIGNS:  Blood pressure 92/57, pulse 69, temperature 97.7 F (36.5 C), temperature source Oral, resp. rate 13, height 5\' 11"  (1.803 m), weight 83.008 kg (183 lb), SpO2 96 %.  PHYSICAL EXAMINATION:  GENERAL:  45 y.o.-year-old patient lying in the bed with no acute distress. Appears critically  ill EYES: Pupils equal, round, reactive to light and accommodation. No scleral icterus. Extraocular muscles intact. Pallor HEENT: Head atraumatic, normocephalic. Oropharynx and nasopharynx clear.  NECK:  Supple, no jugular venous distention. No thyroid enlargement, no tenderness.  LUNGS: Normal breath sounds bilaterally, no wheezing, rales,rhonchi or crepitation. No use of accessory muscles of respiration.  CARDIOVASCULAR: S1, S2 normal. No murmurs, rubs, or gallops.  ABDOMEN: Soft, nontender, distended. Bowel sounds present. No organomegaly or mass.  EXTREMITIES: ++pedal edema,  nocyanosis, or clubbing.  NEUROLOGIC: Cranial nerves II through XII are intact.  Subjective weakness. Unable to assess muscle strength at present. Sensation intact. Gait not checked.  PSYCHIATRIC: The patient is alert and oriented x 3.  SKIN: No obvious rash, lesion, or ulcer.  Patient has chronic skin changes in the lower extremity with venous stasis changes. LABORATORY PANEL:   CBC  Recent Labs Lab 01/07/16 0633  WBC 16.8*  HGB 8.6*  HCT 27.8*  PLT 116*   ------------------------------------------------------------------------------------------------------------------  Chemistries   Recent Labs Lab 01/07/16 0633  NA 132*  K 3.4*  CL 95*  CO2 22  GLUCOSE 82  BUN 52*  CREATININE 5.13*  CALCIUM 8.0*  AST 21  ALT 10*  ALKPHOS 900*  BILITOT 0.8   ------------------------------------------------------------------------------------------------------------------  Cardiac Enzymes  Recent Labs Lab 01/07/16 0633  TROPONINI 0.08*   ------------------------------------------------------------------------------------------------------------------  RADIOLOGY:  Ct Head Wo Contrast  01/07/2016  CLINICAL DATA:  Acute onset of headache and altered mental status. Loss of consciousness. Initial encounter. EXAM: CT HEAD WITHOUT CONTRAST TECHNIQUE: Contiguous axial images were obtained from the base of the  skull through the vertex without intravenous contrast. COMPARISON:  CT of the head performed 06/13/2015 FINDINGS: There is no evidence of acute infarction, mass lesion, or intra- or extra-axial hemorrhage on CT. The posterior fossa, including the cerebellum, brainstem and fourth ventricle, is within normal limits. The third and lateral ventricles, and basal ganglia are unremarkable in appearance. The cerebral hemispheres are symmetric in appearance, with normal gray-white differentiation. No mass effect or midline shift is seen. There is no evidence of fracture; visualized osseous structures are unremarkable in appearance. The visualized portions of the orbits are within normal limits. There is partial opacification of the mastoid air cells bilaterally, and partial opacification of the left frontal sinus. The remaining paranasal sinuses are well-aerated. No significant soft tissue abnormalities are seen. IMPRESSION: 1. No acute intracranial pathology seen on CT. 2. Partial opacification of the mastoid air cells bilaterally, and partial opacification of the left frontal sinus. Electronically Signed   By: Roanna Raider M.D.   On: 01/07/2016 06:24   Dg Chest Portable 1 View  01/07/2016  CLINICAL DATA:  Acute onset of headache and altered mental status. Loss of consciousness. Initial encounter. EXAM: PORTABLE CHEST 1 VIEW COMPARISON:  Chest radiograph performed 10/10/2015 FINDINGS: The lungs are well-aerated. Mild vascular congestion is noted. Mild bibasilar opacities likely reflect atelectasis. There is no evidence of pleural effusion or pneumothorax. The cardiomediastinal silhouette is borderline enlarged. The patient is status post median sternotomy, with evidence of prior CABG. No acute osseous abnormalities are seen. IMPRESSION: Mild vascular congestion and borderline cardiomegaly. Mild bibasilar opacities likely reflect atelectasis. Electronically Signed   By: Roanna Raider M.D.   On: 01/07/2016 06:06     EKG:   Normal sinus rhythm right bundle branch block. IMPRESSION AND PLAN:   Ramesses Crampton  is a 45 y.o. 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  1. Sepsis/septic shock suspected source urine given pyuria is seen in the small amount of urine that's produced in the Foley bag -Urinalysis still pending -Admit to ICU -IV fluids for hydration. Limit IV fluids as for now since patient is dialysis. Patient already has had a liter of bolus IV fluid given his history of CHF -IV levo fed -Follow blood culture urine culture urinalysis -Pharmacy consult for IV antibiotics we'll continue at present and vancomycin, levofloxacin and aztreonam for now  2. ESRD on hemodialysis -Nephrology consultation for in-house dialysis -Potassium is 3.4  3. 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 Thea Silversmith from [facility] urology to see patient for suprapubic catheter functioning -He recommends to get bladder scan done to see patient has urinary retention  4. Hypotension hold off on Coreg, Imdur, lisinopril  5. Headaches with sinus pain with history of sinusitis appears chronic -When necessary Norco  6. Anemia of chronic disease  7. DVT prophylaxis Subcutaneous heparin  Above was discussed with nephrology, urology, ICU intensivist  All the records are reviewed and case discussed with ED provider. Management plans discussed with the patient, family and they are in agreement.  CODE  STATUS DO NOT RESUSCITATE this was discussed with patient in the emergency room and patient's sister-in-law was present  TOTAL  critical TIME TAKING  CARE OF THIS PATIENT: .    Elke Holtry M.D on 01/07/2016 at 10:41 AM  Between 7am to 6pm - Pager - [phone]  After 6pm go to [email or web address] - password [email or web address] [facility]  [facility]  Office  [phone]  CC: Primary care physician; [name], MD

## 2016-01-07 NOTE — H&P (Signed)
[facility]ULM/ICU CONSULT   PATIENT NAME: Taylor [name]    MR#:  [id number]  DATE OF BIRTH:  [birth year]  DATE OF ADMISSION:  01/07/2016  PRIMARY CARE PHYSICIAN: [name], MD   REQUESTING/REFERRING PHYSICIAN: Dr Darnelle Catalan  CHIEF COMPLAINT:  General malaise weakness not feeling well.  HISTORY OF PRESENT ILLNESS:  Taylor [name]  is a 45 y.o. 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 Patient states since his last catheter replacement his urine output has declined. He's been started feeling weak for last 1 week today slumped over while his dad was trying to get him to dialysis. - He was brought to the emergency room where he was found hypothermic with temperature of 96 5 blood pressure in the 70s and 80s systolic and elevated white count of 16.8. CODEepsis was initiated. -His Foley catheter bag has small amount of urine which looks frank pus.  Patient currently on vasopressors, was given IVF's with NS Feeling better since admission  PAST MEDICAL HISTORY:   Past Medical History  Diagnosis Date  . End stage renal disease on dialysis (HCC)     LUE fistula  . Type I diabetes mellitus (HCC)     a. 03/2014 admitted with HNK to [facility]. b. TTS  . Diabetic neuropathy (HCC)     severe, s/p multiple toe amputation  . Hypothyroidism   . Hypertension   . Hyperlipidemia   . H/O hiatal hernia   . GERD (gastroesophageal reflux disease)   . Anxiety   . Sebaceous cyst     side of neck  . Pneumonia     2010  . Anemia     a. req PRBC's 2011.  [name] PAD (peripheral artery disease) (HCC)     a. s/p amputation of toes on the right;  b. left LE claudication.  . Coronary artery disease      a. s/p MI;  b. 10/2009 CABG x 3 @ [facility]: LIMA->LAD, VG->OM3, VG->RPDA; c. 11/2010 Cath 3/3 patent grafts;  d 12/2012 Cath: LM 30d, LAD 85p, D1 70, D2 90, LCX 40ost, OM2 100, RCA 90p, 182m, L->LAD ok, VG->OM3 ok, VG->RPDA 30, EF 50%->Med Rx.  . Cataract     right  . Valvular disease     a. 11/2012 Echo: EF 55-60%, mild LVH, mild MR, mild bi-atrial enlargement, mild-mod TR, PASP ; b. echo 03/2014: EF 50-55%, nl WM, select images concerning for bicuspid aortic valve w/ nl thicknes of leaflets, mild MR, mild bi-atrial dilatation, RV mild dilatation - wall thickness nl, mod TR - select images appears to be mod to sev, PASP at least mod elevated.  . Diastolic CHF (HCC)     see echo above  . Depression   . Arthritis     rheumatoid arthritis   . Myocardial infarction (HCC) 2010  . Pulmonary HTN (HCC)     a. continuation of 03/2014 echo PASP @ least mod elevated. RVSP 52 mm Hg. Parasternal long axis estimated @ 85 mm Hg  . Scrotal abscess     a. s/p multiple I&D  . Penile abscess     a. s/p multiple I&D  . Hematemesis     a. EGD 2016 with LA Grade C esophagitis, continued on Protonix   . Chronic respiratory failure (  HCC)     a. on 3L oxygen via nasal cannula  . Anxiety   . Acute delirium   . Sepsis (HCC)   . 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 MYOCARDIAL INFARCTION, HX OF 01/26/2011    Qualifier: Diagnosis of  By: [name] MD, [name]    . HTN (hypertension) 02/11/2011  . Pulmonary hypertension associated with end stage renal disease on dialysis (HCC) 04/16/2014  . Hyperkalemia   . Hypothyroidism   . Osteomyelitis (HCC)     PAST SURGICAL HISTOIRY:   Past Surgical History  Procedure Laterality Date  . Dialysis fistula creation      left upper arm fistula  . Amputation      TOES ON BOTH FEET  . Abscess drainage  Behind right ear/occipital scalp  . Skin graft    . Eye surgery  1999  . Coronary artery bypass graft  10/2009    [facility] (Dr. Katrinka Blazing)  . Foot amputation    . Cardiac catheterization     . Cardiac catheterization  2/14    [facility]: severe 3 vessel CAD with patent grafts, RHC: moderately elevated PCW and pulmonary hypertension  . Penile prosthesis implant N/A 07/24/2014    Procedure: SALINE PENILE INJECTION WITH DISSECTION OF CORPORA ,  IMPLANTATION OF COLOPLAST PENILE PROTHESIS INFLATABLE;  Surgeon: [name], MD;  Location: [facility] ORS;  Service: Urology;  Laterality: N/A;  . Removal of penile prosthesis N/A 08/22/2014    Procedure: EXPLANT OF INFECTED  PENILE PROSTHESIS;  Surgeon: [name], MD;  Location: [facility] ORS;  Service: Urology;  Laterality: N/A;  . Irrigation and debridement abscess Bilateral 12/14/2014    Procedure: Bilateral Corporal Irrigation with Cultures and Drainage, Penrose Drain Insertion;  Surgeon: [name], MD;  Location: [facility] OR;  Service: Urology;  Laterality: Bilateral;  . Scrotal exploration N/A 02/03/2015    Procedure: IRRIGATION AND DEBRIDEMENT SCROTAL/PENILE ABSCESS;  Surgeon: [name], MD;  Location: [facility] OR;  Service: Urology;  Laterality: N/A;  . Cystoscopy N/A 02/03/2015    Procedure: CYSTOSCOPY;  Surgeon: [name], MD;  Location[facility] OR;  Service: Urology;  Laterality: N/A;  . Esophagogastroduodenoscopy N/A 03/18/2015    Procedure: ESOPHAGOGASTRODUODENOSCOPY (EGD);  Surgeon: [name], MD;  Location: [facility] ENDOSCOPY;  Service: Endoscopy;  Laterality: N/A;  . Cardiac catheterization N/A 05/27/2015    Procedure: Right Heart Cath;  Surgeon: [name], MD;  Location: [facility] INVASIVE CV LAB;  Service: Cardiovascular;  Laterality: N/A;    SOCIAL HISTORY:   Social History  Substance Use Topics  . Smoking status: Never Smoker   . Smokeless tobacco: Former Neurosurgeon    Types: Chew    Quit date: 08/23/1995  . Alcohol Use: No     Comment: occasional beer    FAMILY HISTORY:   Family History  Problem Relation Age of Onset  . Heart disease Other   . Hypertension Other   . Hypertension Mother   . Heart disease Mother    . Diabetes Mother   . Birth defects Paternal Uncle     unaware  . Birth defects Paternal Grandmother     breast  . Kidney disease Neg Hx   . Prostate cancer Neg Hx     DRUG ALLERGIES:   Allergies  Allergen Reactions  . Amoxicillin-Pot Clavulanate Nausea And Vomiting and Itching  . 2nd Skin Quick Heal Other (See Comments)  . Rifampin Nausea And Vomiting  . Tape Other (See Comments)    REVIEW OF SYSTEMS:  Review of Systems  Constitutional: Positive for weight loss. Negative for fever, chills and diaphoresis.  HENT: Negative for congestion, ear pain, hearing loss, nosebleeds and sore throat.   Eyes: Negative for blurred vision, double vision, photophobia and pain.  Respiratory: Negative for hemoptysis, sputum production, wheezing and stridor.   Cardiovascular: Negative for orthopnea, claudication and leg swelling.  Gastrointestinal: Negative for heartburn and abdominal pain.  Genitourinary: Negative for dysuria and frequency.  Musculoskeletal: Negative for back pain, joint pain and neck pain.  Skin: Negative for rash.  Neurological: Positive for weakness and headaches. Negative for tingling, sensory change, speech change, focal weakness and seizures.  Endo/Heme/Allergies: Does not bruise/bleed easily.  Psychiatric/Behavioral: Negative for suicidal ideas, memory loss and substance abuse. The patient is not nervous/anxious.      MEDICATIONS AT HOME:   Prior to Admission medications   Medication Sig Start Date End Date Taking? Authorizing Provider  acetaminophen (TYLENOL) 500 MG tablet Take 1,000 mg by mouth every 6 (six) hours as needed for mild pain or headache.    Yes Historical Provider, MD  alum & mag hydroxide-simeth (MAALOX PLUS) 400-400-40 MG/5ML suspension Take 30 mLs by mouth every 4 (four) hours as needed for indigestion.    Yes Historical Provider, MD  amitriptyline (ELAVIL) 25 MG tablet Take 1 tablet (25 mg total) by mouth at bedtime. 08/21/14  Yes Dianne Dun, MD   aspirin EC 81 MG tablet Take 81 mg by mouth daily.   Yes Historical Provider, MD  calcium-vitamin D (OSCAL WITH D) 250-125 MG-UNIT tablet Take 1 tablet by mouth daily.   Yes Historical Provider, MD  carvedilol (COREG) 25 MG tablet Take 1 tablet (25 mg total) by mouth 2 (two) times daily with a meal. 08/28/15  Yes [name], MD  cinacalcet (SENSIPAR) 30 MG tablet Take 30 mg by mouth daily.   Yes Historical Provider, MD  citalopram (CELEXA) 20 MG tablet Take 20 mg by mouth daily.   Yes Historical Provider, MD  cloNIDine (CATAPRES) 0.1 MG tablet Take 0.1 mg by mouth 2 (two) times daily.   Yes Historical Provider, MD  clopidogrel (PLAVIX) 75 MG tablet Take 1 tablet (75 mg total) by mouth daily. 09/11/15  Yes Dianne Dun, MD  DHA-EPA-VITAMIN E PO Take 1 capsule by mouth daily.   Yes Historical Provider, MD  furosemide (LASIX) 80 MG tablet Take 80 mg by mouth 2 (two) times daily.   Yes Historical Provider, MD  ibuprofen (ADVIL,MOTRIN) 200 MG tablet Take 200 mg by mouth every 6 (six) hours as needed. Patient states he only takes 2 a day   Yes Historical Provider, MD  insulin aspart (NOVOLOG) 100 UNIT/ML injection Inject 0-7 Units into the skin 4 (four) times daily -  before meals and at bedtime. Patient taking differently: Inject 4 Units into the skin 3 (three) times daily. If blood sugar is high pt. Will take up to 7 units if needed. 03/12/15  Yes Altamese Dilling, MD  insulin glargine (LANTUS) 100 UNIT/ML injection Inject 14 Units into the skin at bedtime.   Yes Historical Provider, MD  isosorbide dinitrate (ISORDIL) 30 MG tablet Take 30 mg by mouth daily.   Yes Historical Provider, MD  levothyroxine (SYNTHROID, LEVOTHROID) 75 MCG tablet Take 1 tablet (75 mcg total) by mouth daily before breakfast. 07/26/13  Yes Dianne Dun, MD  lisinopril (PRINIVIL,ZESTRIL) 10 MG tablet Take 10 mg by mouth daily.   Yes Historical Provider, MD  metolazone (ZAROXOLYN) 5 MG tablet Take  5 mg by mouth daily.   Yes  Historical Provider, MD  metronidazole (FLAGYL) 375 MG capsule Take 375 mg by mouth 2 (two) times daily.   Yes Historical Provider, MD  nitroGLYCERIN (NITROSTAT) 0.4 MG SL tablet Place 0.4 mg under the tongue every 5 (five) minutes as needed for chest pain.   Yes Historical Provider, MD  Omega-3 Fatty Acids (FISH OIL) 1000 MG CAPS Take 1 capsule by mouth daily.   Yes Historical Provider, MD  omeprazole (PRILOSEC) 40 MG capsule Take 40 mg by mouth daily.   Yes Historical Provider, MD  OXYGEN Inhale into the lungs.   Yes Historical Provider, MD  pantoprazole (PROTONIX) 40 MG tablet Take 40 mg by mouth 2 (two) times daily.   Yes Historical Provider, MD  pregabalin (LYRICA) 75 MG capsule Take 75 mg by mouth 2 (two) times daily.    Yes Historical Provider, MD  PROAIR HFA 108 (90 Base) MCG/ACT inhaler Inhale 1-2 puffs into the lungs every 6 (six) hours as needed.  10/08/15  Yes Historical Provider, MD  Probiotic CAPS Take 1 capsule by mouth daily. 10/15/15  Yes Enid Baas, MD  rosuvastatin (CRESTOR) 20 MG tablet Take 1 tablet (20 mg total) by mouth at bedtime. 12/13/12  Yes Dianne Dun, MD  sevelamer carbonate (RENVELA) 800 MG tablet Take 800 mg by mouth 3 (three) times daily with meals.    Yes Historical Provider, MD  traZODone (DESYREL) 50 MG tablet Take 50 mg by mouth at bedtime.   Yes Historical Provider, MD      VITAL SIGNS:  Blood pressure 94/53, pulse 68, temperature 97.7 F (36.5 C), temperature source Oral, resp. rate 18, height 5\' 11"  (1.803 m), weight 183 lb (83.008 kg), SpO2 97 %.  PHYSICAL EXAMINATION:  GENERAL:  45 y.o.-year-old patient lying in the bed with no acute distress. Appears critically ill EYES: Pupils equal, round, reactive to light and accommodation. No scleral icterus. Extraocular muscles intact. Pallor HEENT: Head atraumatic, normocephalic. Oropharynx and nasopharynx clear.  NECK:  Supple, no jugular venous distention. No thyroid enlargement, no tenderness.  LUNGS:  Normal breath sounds bilaterally, no wheezing, rales,rhonchi or crepitation. No use of accessory muscles of respiration.  CARDIOVASCULAR: S1, S2 normal. No murmurs, rubs, or gallops.  ABDOMEN: Soft, nontender, distended. Bowel sounds present. No organomegaly or mass.  EXTREMITIES: ++pedal edema,  nocyanosis, or clubbing.  NEUROLOGIC: Cranial nerves II through XII are intact.  Subjective weakness. Unable to assess muscle strength at present. Sensation intact. Gait not checked.  PSYCHIATRIC: The patient is alert and oriented x 3.  SKIN: No obvious rash, lesion, or ulcer.  Patient has chronic skin changes in the lower extremity with venous stasis changes. LABORATORY PANEL:   CBC  Recent Labs Lab 01/07/16 0633  WBC 16.8*  HGB 8.6*  HCT 27.8*  PLT 116*   ------------------------------------------------------------------------------------------------------------------  Chemistries   Recent Labs Lab 01/07/16 0633  NA 132*  K 3.4*  CL 95*  CO2 22  GLUCOSE 82  BUN 52*  CREATININE 5.13*  CALCIUM 8.0*  AST 21  ALT 10*  ALKPHOS 900*  BILITOT 0.8   ------------------------------------------------------------------------------------------------------------------  Cardiac Enzymes  Recent Labs Lab 01/07/16 0633  TROPONINI 0.08*   ------------------------------------------------------------------------------------------------------------------  RADIOLOGY:  Ct Head Wo Contrast  01/07/2016  CLINICAL DATA:  Acute onset of headache and altered mental status. Loss of consciousness. Initial encounter. EXAM: CT HEAD WITHOUT CONTRAST TECHNIQUE: Contiguous axial images were obtained from the base of the skull through the vertex without intravenous  contrast. COMPARISON:  CT of the head performed 06/13/2015 FINDINGS: There is no evidence of acute infarction, mass lesion, or intra- or extra-axial hemorrhage on CT. The posterior fossa, including the cerebellum, brainstem and fourth ventricle,  is within normal limits. The third and lateral ventricles, and basal ganglia are unremarkable in appearance. The cerebral hemispheres are symmetric in appearance, with normal gray-white differentiation. No mass effect or midline shift is seen. There is no evidence of fracture; visualized osseous structures are unremarkable in appearance. The visualized portions of the orbits are within normal limits. There is partial opacification of the mastoid air cells bilaterally, and partial opacification of the left frontal sinus. The remaining paranasal sinuses are well-aerated. No significant soft tissue abnormalities are seen. IMPRESSION: 1. No acute intracranial pathology seen on CT. 2. Partial opacification of the mastoid air cells bilaterally, and partial opacification of the left frontal sinus. Electronically Signed   By: Roanna Raider M.D.   On: 01/07/2016 06:24   Dg Chest Portable 1 View  01/07/2016  CLINICAL DATA:  Acute onset of headache and altered mental status. Loss of consciousness. Initial encounter. EXAM: PORTABLE CHEST 1 VIEW COMPARISON:  Chest radiograph performed 10/10/2015 FINDINGS: The lungs are well-aerated. Mild vascular congestion is noted. Mild bibasilar opacities likely reflect atelectasis. There is no evidence of pleural effusion or pneumothorax. The cardiomediastinal silhouette is borderline enlarged. The patient is status post median sternotomy, with evidence of prior CABG. No acute osseous abnormalities are seen. IMPRESSION: Mild vascular congestion and borderline cardiomegaly. Mild bibasilar opacities likely reflect atelectasis. Electronically Signed   By: Roanna Raider M.D.   On: 01/07/2016 06:06     IMPRESSION AND PLAN:   45 y.o. 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 Sepsis/septic shock suspected source urine given pyuria is seen in the small amount of urine that's produced in the Foley bag -Urinalysis still pending -Admit to step down -IV fluids for hydration.  -IV levo fed to keep MAP>65 -Follow blood culture urine culture urinalysis -Pharmacy consult for IV antibiotics  -CODE SEPSIS follow up LA 2.9-->2.4  2. ESRD on hemodialysis -Nephrology consultation for in-house dialysis  3. History of suprapubic catheter secondary to urethrocutaneous fistula chronic -follow up urology recs  I have personally obtained a history, examined the patient, evaluated Pertinent laboratory and RadioGraphic/imaging results, and  formulated the assessment and plan   The Patient requires high complexity decision making for assessment and support, frequent evaluation and titration of therapies.  Patient/Family are satisfied with Plan of action and management. All questions answered  Lucie Leather, M.D.  [facility]  Medical Director [facility] Medical Director [facility] Cardio-Pulmonary Department

## 2016-01-07 NOTE — Progress Notes (Signed)
[facility]  ROUNDING NOTE   Subjective:  Patient well known to [name].  We follow him closely for outpt HD.   He had recent [facility] placement.  Since that time he's noted decreased urine outpt.  He's had increasing weakness over the past week.  SPC catheter tubing revealed what appeared to be purulent urine. He slumped over while getting ready to go to dialysis.  Brought to ED subsequently when code sepsis was called as he was hypothermic and hypotensive.  After initial resuscitation pt doing better.     Objective:  Vital signs in last 24 hours:  Temp:  [97.6 F (36.4 C)-97.7 F (36.5 C)] 97.7 F (36.5 C) (02/28 1600) Pulse Rate:  [47-77] 69 (02/28 1900) Resp:  [10-23] 22 (02/28 1900) BP: (81-117)/(46-62) 89/50 mmHg (02/28 1900) SpO2:  [91 %-99 %] 96 % (02/28 1900) Weight:  [83.008 kg (183 lb)] 83.008 kg (183 lb) (02/28 0534)  Weight change:  Filed Weights   01/07/16 0534  Weight: 83.008 kg (183 lb)    Intake/Output: I/O last 3 completed shifts: In: 1191.5 [P.O.:240; I.V.:501.5; IV Piggyback:450] Out: 1 [Stool:1]   Intake/Output this shift:     Physical Exam: General: NAD, resting in bed  Head: Normocephalic, atraumatic. Moist oral mucosal membranes  Eyes: Anicteric, PERRL  Neck: Supple, trachea midline  Lungs:  Clear to auscultation normal effort  Heart: S1S2 no rubs  Abdomen:  Soft, nontender, BS present  Extremities: trace peripheral edema.  Neurologic: Nonfocal, moving all four extremities  Skin: No lesions  Access: LUE AVF    Basic Metabolic Panel:  Recent Labs Lab 01/07/16 0633  NA 132*  K 3.4*  CL 95*  CO2 22  GLUCOSE 82  BUN 52*  CREATININE 5.13*  CALCIUM 8.0*    Liver Function Tests:  Recent Labs Lab 01/07/16 0633  AST 21  ALT 10*  ALKPHOS 900*  BILITOT 0.8  PROT 6.4*  ALBUMIN 2.2*   No results for input(s): LIPASE, AMYLASE in the last 168 hours. No results for input(s): AMMONIA in the last 168 hours.  CBC:  Recent  Labs Lab 01/07/16 0633  WBC 16.8*  HGB 8.6*  HCT 27.8*  MCV 89.7  PLT 116*    Cardiac Enzymes:  Recent Labs Lab 01/07/16 0633  TROPONINI 0.08*    BNP: Invalid input(s): POCBNP  CBG:  Recent Labs Lab 01/07/16 0530 01/07/16 1204 01/07/16 1746 01/07/16 1935  GLUCAP 96 120* 154* 140*    Microbiology: Results for orders placed or performed during the hospital encounter of 01/07/16  Blood Culture (routine x 2)     Status: None (Preliminary result)   Collection Time: 01/07/16  6:33 AM  Result Value Ref Range Status   Specimen Description BLOOD RTA  Final   Special Requests   Final    BOTTLES DRAWN AEROBIC AND ANAEROBIC AER ANA   Culture NO GROWTH < 12 HOURS  Final   Report Status PENDING  Incomplete  Blood Culture (routine x 2)     Status: None (Preliminary result)   Collection Time: 01/07/16  6:58 AM  Result Value Ref Range Status   Specimen Description BLOOD RIGHT ARM  Final   Special Requests   Final    BOTTLES DRAWN AEROBIC AND ANAEROBIC AER ANA   Culture NO GROWTH < 12 HOURS  Final   Report Status PENDING  Incomplete  MRSA PCR Screening     Status: None   Collection Time: 01/07/16 12:20 PM  Result Value Ref Range Status   MRSA by PCR NEGATIVE NEGATIVE Final    Comment:        The GeneXpert MRSA Assay (FDA approved for NASAL specimens only), is one component of a comprehensive MRSA colonization surveillance program. It is not intended to diagnose MRSA infection nor to guide or monitor treatment for MRSA infections.     Coagulation Studies: No results for input(s): LABPROT, INR in the last 72 hours.  Urinalysis:  Recent Labs  01/07/16 1045  COLORURINE YELLOW*  LABSPEC 1.016  PHURINE 5.0  GLUCOSEU 50*  HGBUR 2+*  BILIRUBINUR NEGATIVE  KETONESUR NEGATIVE  PROTEINUR 100*  NITRITE NEGATIVE  LEUKOCYTESUR 2+*      Imaging: Ct Head Wo Contrast  01/07/2016  CLINICAL DATA:  Acute onset of headache and altered mental status.  Loss of consciousness. Initial encounter. EXAM: CT HEAD WITHOUT CONTRAST TECHNIQUE: Contiguous axial images were obtained from the base of the skull through the vertex without intravenous contrast. COMPARISON:  CT of the head performed 06/13/2015 FINDINGS: There is no evidence of acute infarction, mass lesion, or intra- or extra-axial hemorrhage on CT. The posterior fossa, including the cerebellum, brainstem and fourth ventricle, is within normal limits. The third and lateral ventricles, and basal ganglia are unremarkable in appearance. The cerebral hemispheres are symmetric in appearance, with normal gray-white differentiation. No mass effect or midline shift is seen. There is no evidence of fracture; visualized osseous structures are unremarkable in appearance. The visualized portions of the orbits are within normal limits. There is partial opacification of the mastoid air cells bilaterally, and partial opacification of the left frontal sinus. The remaining paranasal sinuses are well-aerated. No significant soft tissue abnormalities are seen. IMPRESSION: 1. No acute intracranial pathology seen on CT. 2. Partial opacification of the mastoid air cells bilaterally, and partial opacification of the left frontal sinus. Electronically Signed   By: Roanna Raider M.D.   On: 01/07/2016 06:24   Dg Chest Portable 1 View  01/07/2016  CLINICAL DATA:  Acute onset of headache and altered mental status. Loss of consciousness. Initial encounter. EXAM: PORTABLE CHEST 1 VIEW COMPARISON:  Chest radiograph performed 10/10/2015 FINDINGS: The lungs are well-aerated. Mild vascular congestion is noted. Mild bibasilar opacities likely reflect atelectasis. There is no evidence of pleural effusion or pneumothorax. The cardiomediastinal silhouette is borderline enlarged. The patient is status post median sternotomy, with evidence of prior CABG. No acute osseous abnormalities are seen. IMPRESSION: Mild vascular congestion and borderline  cardiomegaly. Mild bibasilar opacities likely reflect atelectasis. Electronically Signed   By: Roanna Raider M.D.   On: 01/07/2016 06:06     Medications:   . sodium chloride 75 mL/hr at 01/07/16 1226  . norepinephrine (LEVOPHED) Adult infusion 1 mcg/min (01/07/16 1952)   . aspirin EC  81 mg Oral Daily  . calcium-vitamin D  1 tablet Oral Daily  . cefTRIAXone (ROCEPHIN)  IV  2 g Intravenous Q24H  . cinacalcet  30 mg Oral Q supper  . citalopram  20 mg Oral Daily  . clopidogrel  75 mg Oral Daily  . docusate sodium  100 mg Oral BID  . heparin  5,000 Units Subcutaneous 3 times per day  . insulin aspart  1-3 Units Subcutaneous 6 times per day  . insulin glargine  8 Units Subcutaneous QHS  . [START ON 01/08/2016] levothyroxine  75 mcg Oral Q0600  . omega-3 acid ethyl esters  1 g Oral Daily  . pantoprazole  40 mg Oral Daily  .  pregabalin  75 mg Oral BID  . rosuvastatin  20 mg Oral QHS  . sevelamer carbonate  800 mg Oral TID WC  . traZODone  50 mg Oral QHS   acetaminophen **OR** acetaminophen, HYDROcodone-acetaminophen, morphine injection, naLOXone (NARCAN)  injection, ondansetron **OR** ondansetron (ZOFRAN) IV  Assessment/ Plan:  45 y.o. 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 TTS  1.  ESRD on HD TTHS:  Pt's blood pressure low today, electrolytes acceptable, no signs of volume overload, therefore will hold off on HD at the moment, we will reassess for HD tomorrow.    2.  Septic shock:  Suspect urinary source.  Agree with ceftriaxone and pressor support at this time.  Agree with pulm/cc evaluation.    3.  Anemia  of CKD:  hgb 8.6, will start epogen with HD.  4.  SHPTH:  Check ipth and phos with next HD, continue renvela 800mg  po tid/wm for now.    5.  Thanks for consult, further plan as pt progresses.   LOS: 0 Kersten Salmons 2/28/20177:53 PM

## 2016-01-07 NOTE — ED Notes (Signed)
Patient transported to CT 

## 2016-01-07 NOTE — ED Notes (Signed)
X-ray at bedside

## 2016-01-07 NOTE — ED Notes (Signed)
Patient back from CT.

## 2016-01-07 NOTE — Progress Notes (Signed)
Pharmacy Antibiotic Note  Taylor [name] is a 44 y.o. male admitted on 01/07/2016 with sepsis.  Pharmacy has been consulted for aztreonam/levofloxacin/vancomycin dosing. This patient does receive hemodialysis.   Plan: 1. Aztreonam 2 gm IV x 1 followed by 500 mg IV Q12H. 2. Levofloxacin 750 mg IV x 1 followed by 500 mg IV Q48H 3. Vancomycin 1.75 gm IV x 1 followed by 750 mg IV Q TTS dialysis. Pharmacy will continue to monitor and adjust as needed to maintain trough 15 to 20 mcg/mL.   Height: 5\' 11"  (180.3 cm) Weight: 183 lb (83.008 kg) IBW/kg (Calculated) : 75.3  Temp (24hrs), Avg:97.7 F (36.5 C), Min:97.7 F (36.5 C), Max:97.7 F (36.5 C)   Recent Labs Lab 01/07/16 0633  WBC 16.8*  CREATININE 5.13*    Estimated Creatinine Clearance: 19.2 mL/min (by C-G formula based on Cr of 5.13).    Allergies  Allergen Reactions  . Amoxicillin-Pot Clavulanate Nausea And Vomiting and Itching  . 2nd Skin Quick Heal Other (See Comments)  . Rifampin Nausea And Vomiting  . Tape Other (See Comments)    Thank you for allowing pharmacy to be a part of this patient's care.  01/09/16, Pharm.D., BCPS Clinical Pharmacist 01/07/2016 7:34 AM
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
## 2016-01-07 NOTE — Care Management (Signed)
Patient well known to this CM.  He lives at home with his parents and receives hemodialysis T TH S at [facility] on [address]  his father transports.  His father provides patient's personal care.  No issues accessing medical care, obtaining meds.   Patient has always declined home health services.  He has a supra pubic catheter which was changed 2 weeks ago at urology office.  Admitted for sx concerning for sepsis.  It is noted that this patient on each admission appeals his discharge. last admission patient's reason for appeal was that his brother did not think patient should go home due to care demands on his father. Asa Lente upheld the discharge.  When an anticipated discharge date is identified, CM needs to initiate a HINN 12 so Keppro review can be started prior to the day of discharge

## 2016-01-07 NOTE — ED Notes (Addendum)
Pt presents to ED from personal home with c/o of headache and altered mental status upon sitting position. EMS states family attempted to sit patient up for a dialysis appt this morning when he experienced LOC episodes. EMS states pt has a hx of dialysis. Pt arrived to ER with 90% RA oxygen saturation. Pt requires home oxygen at 3L. Pt arrived to ER able to answer questions appropriately.

## 2016-01-07 NOTE — Consult Note (Addendum)
Urology Consult  Referring physician: Dr. Posey Pronto Reason for referral: poorly draining SP tube, sepsis from urinary source  Chief Complaint: headache  History of Present Illness: Taylor [name] is a 45yo with a hx of ESRD, DMI, PVD, and penile wound admitted with sepsis likely from a urinary source. The pt had been doing well until 4 days ago when his urine output decreased from 2L to 50-100cc. 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 Per patient his SP tube has been draining pus for 3 days. He has new fatigue and shoulder pain.  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 He was scheduled to see [facility] urology but he cancelled his appointmen  Past Medical History  Diagnosis Date  . End stage renal disease on dialysis ([place])     LUE fistula  . Type I diabetes mellitus ([place])     a. 03/2014 admitted with HNK to [facility]. b. TTS  . Diabetic neuropathy (HCC)     severe, s/p multiple toe amputation  . Hypothyroidism   . Hypertension   . Hyperlipidemia   . H/O hiatal hernia   . GERD (gastroesophageal reflux disease)   . Anxiety   . Sebaceous cyst     side of neck  . Pneumonia     2010  . Anemia     a. req PRBC's 2011.  [name] PAD (peripheral artery disease) ([place])     a. s/p amputation of toes on the right;  b. left LE claudication.  . Coronary artery disease     a. s/p MI;  b. 10/2009 CABG x 3 @ [facility]: LIMA->LAD, VG->OM3, VG->RPDA; c. 11/2010 Cath 3/3 patent grafts;  d 12/2012 Cath: LM 30d, LAD 85p, D1 70, D2 90, LCX 40ost, OM2 100, RCA 90p, 181m L->LAD ok, VG->OM3 ok, VG->RPDA 30, EF 50%->Med Rx.  . Cataract     right  . Valvular disease     a. 11/2012 Echo: EF 55-60%, mild LVH, mild Taylor, mild bi-atrial enlargement, mild-mod TR, PASP 428mg; b. echo 03/2014: EF 50-55%, nl WM, select images concerning for bicuspid aortic valve w/ nl thicknes of leaflets, mild Taylor, mild bi-atrial dilatation, RV mild dilatation - wall thickness nl, mod TR - select images appears  to be mod to sev, PASP at least mod elevated.  . Diastolic CHF ([place]    see echo above  . Depression   . Arthritis     rheumatoid arthritis   . Myocardial infarction ([facility]2010  . Pulmonary HTN ([place]    a. continuation of 03/2014 echo PASP @ least mod elevated. RVSP 52 mm Hg. Parasternal long axis estimated @ 85 mm Hg  . Scrotal abscess     a. s/p multiple I&D  . Penile abscess     a. s/p multiple I&D  . Hematemesis     a. EGD 2016 with LA Grade C esophagitis, continued on Protonix   . Chronic respiratory failure (HCC)     a. on 3L oxygen via nasal cannula  . Anxiety   . Acute delirium   . Sepsis (HCEaston  . 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 MYOCARDIAL INFARCTION, HX OF 01/26/2011    Qualifier: Diagnosis of  By: ArDeborra MedinaD, TaTanja Port  . HTN (hypertension) 02/11/2011  . Pulmonary hypertension associated with end stage renal disease on dialysis (HCTowaoc6/06/2014  . Hyperkalemia   . Hypothyroidism   . Osteomyelitis (HCHor[place]  Past Surgical History  Procedure Laterality Date  . Dialysis fistula creation      left upper arm fistula  . Amputation      TOES ON BOTH FEET  . Abscess drainage  Behind right ear/occipital scalp  . Skin graft    . Eye surgery  1999  . Coronary artery bypass graft  10/2009    [facility] (Dr. Tamala Julian)  . Foot amputation    . Cardiac catheterization    . Cardiac catheterization  2/14    [facility]: severe 3 vessel CAD with patent grafts, RHC: moderately elevated PCW and pulmonary hypertension  . Penile prosthesis implant N/A 07/24/2014    Procedure: SALINE PENILE INJECTION WITH DISSECTION OF CORPORA ,  IMPLANTATION OF COLOPLAST PENILE PROTHESIS INFLATABLE;  Surgeon: Ailene Rud, MD;  Location: [facility];  Service: Urology;  Laterality: N/A;  . Removal of penile prosthesis N/A 08/22/2014    Procedure: EXPLANT OF INFECTED  PENILE PROSTHESIS;  Surgeon: Jorja Loa, MD;  Location: [facility];  Service: Urology;  Laterality: N/A;  . Irrigation and debridement abscess  Bilateral 12/14/2014    Procedure: Bilateral Corporal Irrigation with Cultures and Drainage, Penrose Drain Insertion;  Surgeon: Ailene Rud, MD;  Location: [place];  Service: Urology;  Laterality: Bilateral;  . Scrotal exploration N/A 02/03/2015    Procedure: IRRIGATION AND DEBRIDEMENT SCROTAL/PENILE ABSCESS;  Surgeon: Alexis Frock, MD;  Location: [place];  Service: Urology;  Laterality: N/A;  . Cystoscopy N/A 02/03/2015    Procedure: CYSTOSCOPY;  Surgeon: Alexis Frock, MD;  Location: [place];  Service: Urology;  Laterality: N/A;  . Esophagogastroduodenoscopy N/A 03/18/2015    Procedure: ESOPHAGOGASTRODUODENOSCOPY (EGD);  Surgeon: Manya Silvas, MD;  Location: [facility] ENDOSCOPY;  Service: Endoscopy;  Laterality: N/A;  . Cardiac catheterization N/A 05/27/2015    Procedure: Right Heart Cath;  Surgeon: Wellington Hampshire, MD;  Location: [place] CV LAB;  Service: Cardiovascular;  Laterality: N/A;    Medications: I have reviewed the patient's current medications. Allergies:  Allergies  Allergen Reactions  . Amoxicillin-Pot Clavulanate Nausea And Vomiting and Itching  . 2nd Skin Quick Heal Other (See Comments)  . Rifampin Nausea And Vomiting  . Tape Other (See Comments)    Family History  Problem Relation Age of Onset  . Heart disease Other   . Hypertension Other   . Hypertension Mother   . Heart disease Mother   . Diabetes Mother   . Birth defects Paternal Uncle     unaware  . Birth defects Paternal Grandmother     breast  . Kidney disease Neg Hx   . Prostate cancer Neg Hx    Social History:  reports that he has never smoked. He quit smokeless tobacco use about 20 years ago. His smokeless tobacco use included Chew. He reports that he does not drink alcohol or use illicit drugs.  Review of Systems  Constitutional: Positive for fever and diaphoresis.  Gastrointestinal: Positive for nausea.  Genitourinary: Positive for flank pain.  All other systems reviewed and are  negative.   Physical Exam:  Vital signs in last 24 hours: Temp:  [97.7 F (36.5 C)] 97.7 F (36.5 C) (02/28 0534) Pulse Rate:  [65-70] 68 (02/28 1200) Resp:  [10-23] 18 (02/28 1200) BP: (81-94)/(46-62) 94/53 mmHg (02/28 1200) SpO2:  [92 %-99 %] 97 % (02/28 1200) Weight:  [83.008 kg (183 lb)] 83.008 kg (183 lb) (02/28 0534) Physical Exam  Constitutional: He is oriented to person, place, and time. He appears well-developed  and well-nourished.  HENT:  Head: Normocephalic and atraumatic.  Eyes: Right conjunctiva is not injected. Left conjunctiva is not injected. Right eye exhibits abnormal extraocular motion. Left eye exhibits abnormal extraocular motion.  Neck: Normal range of motion. No thyromegaly present.  Cardiovascular: Normal rate and regular rhythm.   Respiratory: Effort normal. No respiratory distress.  GI: Soft. He exhibits no distension and no mass. There is no tenderness. There is no rebound and no guarding. Hernia confirmed negative in the right inguinal area and confirmed negative in the left inguinal area.  Genitourinary: Right testis shows swelling. Right testis shows no mass. Left testis shows swelling. Left testis shows no mass.  Phallus without erythema or edema. Scrotum with mild edema and erythema. No gross purulence  Musculoskeletal: Normal range of motion.  Neurological: He is alert and oriented to person, place, and time.  Skin: Skin is warm and dry.  Psychiatric: He has a normal mood and affect. His behavior is normal. Judgment and thought content normal.    Laboratory Data:  Results for orders placed or performed during the hospital encounter of 01/07/16 (from the past 72 hour(s))  Glucose, capillary     Status: None   Collection Time: 01/07/16  5:30 AM  Result Value Ref Range   Glucose-Capillary 96 65 - 99 mg/dL  CBC     Status: Abnormal   Collection Time: 01/07/16  6:33 AM  Result Value Ref Range   WBC 16.8 (H) 3.8 - 10.6 K/uL   RBC 3.10 (L) 4.40 - 5.90  MIL/uL   Hemoglobin 8.6 (L) 13.0 - 18.0 g/dL   HCT 27.8 (L) 40.0 - 52.0 %   MCV 89.7 80.0 - 100.0 fL   MCH 27.7 26.0 - 34.0 pg   MCHC 30.9 (L) 32.0 - 36.0 g/dL   RDW 16.9 (H) 11.5 - 14.5 %   Platelets 116 (L) 150 - 440 K/uL  Comprehensive metabolic panel     Status: Abnormal   Collection Time: 01/07/16  6:33 AM  Result Value Ref Range   Sodium 132 (L) 135 - 145 mmol/L   Potassium 3.4 (L) 3.5 - 5.1 mmol/L   Chloride 95 (L) 101 - 111 mmol/L   CO2 22 22 - 32 mmol/L   Glucose, Bld 82 65 - 99 mg/dL   BUN 52 (H) 6 - 20 mg/dL   Creatinine, Ser 5.13 (H) 0.61 - 1.24 mg/dL   Calcium 8.0 (L) 8.9 - 10.3 mg/dL   Total Protein 6.4 (L) 6.5 - 8.1 g/dL   Albumin 2.2 (L) 3.5 - 5.0 g/dL   AST 21 15 - 41 U/L   ALT 10 (L) 17 - 63 U/L   Alkaline Phosphatase 900 (H) 38 - 126 U/L   Total Bilirubin 0.8 0.3 - 1.2 mg/dL   GFR calc non Af Amer 12 (L) >60 mL/min   GFR calc Af Amer 14 (L) >60 mL/min    Comment: (NOTE) The eGFR has been calculated using the CKD EPI equation. This calculation has not been validated in all clinical situations. eGFR's persistently <60 mL/min signify possible Chronic Kidney Disease.    Anion gap 15 5 - 15  Troponin I     Status: Abnormal   Collection Time: 01/07/16  6:33 AM  Result Value Ref Range   Troponin I 0.08 (H) <0.031 ng/mL    Comment: READ BACK AND VERIFIED WITH AMY TEAGUE AT 0719 ON 01/07/16.Marland KitchenMarland KitchenLone Oak        PERSISTENTLY INCREASED TROPONIN VALUES IN THE RANGE OF  0.04-0.49 ng/mL CAN BE SEEN IN:       -UNSTABLE ANGINA       -CONGESTIVE HEART FAILURE       -MYOCARDITIS       -CHEST TRAUMA       -ARRYHTHMIAS       -LATE PRESENTING MYOCARDIAL INFARCTION       -COPD   CLINICAL FOLLOW-UP RECOMMENDED.   Lactic acid, plasma     Status: Abnormal   Collection Time: 01/07/16  8:51 AM  Result Value Ref Range   Lactic Acid, Venous 2.9 (HH) 0.5 - 2.0 mmol/L    Comment: CRITICAL RESULT CALLED TO, READ BACK BY AND VERIFIED WITH AMY TEAGUE 01/07/16 0930 SGD   Urinalysis  complete, with microscopic ([facility] only)     Status: Abnormal   Collection Time: 01/07/16 10:45 AM  Result Value Ref Range   Color, Urine YELLOW (A) YELLOW   APPearance TURBID (A) CLEAR   Glucose, UA 50 (A) NEGATIVE mg/dL   Bilirubin Urine NEGATIVE NEGATIVE   Ketones, ur NEGATIVE NEGATIVE mg/dL   Specific Gravity, Urine 1.016 1.005 - 1.030   Hgb urine dipstick 2+ (A) NEGATIVE   pH 5.0 5.0 - 8.0   Protein, ur 100 (A) NEGATIVE mg/dL   Nitrite NEGATIVE NEGATIVE   Leukocytes, UA 2+ (A) NEGATIVE   RBC / HPF TOO NUMEROUS TO COUNT 0 - 5 RBC/hpf   WBC, UA TOO NUMEROUS TO COUNT 0 - 5 WBC/hpf   Bacteria, UA MANY (A) NONE SEEN   Squamous Epithelial / LPF NONE SEEN NONE SEEN   WBC Clumps PRESENT   Glucose, capillary     Status: Abnormal   Collection Time: 01/07/16 12:04 PM  Result Value Ref Range   Glucose-Capillary 120 (H) 65 - 99 mg/dL   No results found for this or any previous visit (from the past 240 hour(s)). Creatinine:  Recent Labs  01/07/16 1173  CREATININE 5.13*   Baseline Creatinine: NA  Impression/Assessment:  44yo with chronic SP tube due to GU fistula and sepsis from a urinary source  Plan:  1. Sepsis from a urinary source: continue broad spectrum antibiotics 2. Chronic SP tube: SP tube flushed with 120cc of normal saline and SP tube irrigated well. Continue SP tube to straight drain.  Pt will need followup with [facility] urology in 2 weeks for SP tube change Urology signing off  [place], [place] 01/07/2016, 1:20 PM

## 2016-01-07 NOTE — Progress Notes (Addendum)
Inpatient Diabetes Program Recommendations  AACE/ADA: New Consensus Statement on Inpatient Glycemic Control (2015)  Target Ranges:  Prepandial:   less than 140 mg/dL      Peak postprandial:   less than 180 mg/dL (1-2 hours)      Critically ill patients:  140 - 180 mg/dL   Review of Glycemic Control:  Results for KRISHON, ADKISON (MRN [id number]) as of 01/07/2016 16:18  Ref. Range 01/07/2016 05:30 01/07/2016 12:04  Glucose-Capillary Latest Ref Range: 65-99 mg/dL 96 253 (H)    Diabetes history: Type 1 diabetes Outpatient Diabetes medications: Novolog 4 units tid with meals, Lantus 14 units q HS Current orders for Inpatient glycemic control:  None noted  Inpatient Diabetes Program Recommendations:    Please consider restarting Lantus 8 units q HS and Novolog sensitive correction tid with meals and HS scale.  He also may need low dose Novolog meal coverage 3 units tid with meals (hold if patient eats less than 50%).  Note patient is sensitive to insulin doses therefore will need lower doses due to renal failure.    Thanks, Beryl Meager, RN, BC-ADM Inpatient Diabetes Coordinator Pager [phone] (8a-5p)

## 2016-01-08 ENCOUNTER — Inpatient Hospital Stay: Payer: Medicare Other

## 2016-01-08 DIAGNOSIS — J9601 Acute respiratory failure with hypoxia: Secondary | ICD-10-CM

## 2016-01-08 LAB — BASIC METABOLIC PANEL
Anion gap: 18 — ABNORMAL HIGH (ref 5–15)
BUN: 66 mg/dL — AB (ref 6–20)
CHLORIDE: 94 mmol/L — AB (ref 101–111)
CO2: 18 mmol/L — AB (ref 22–32)
CREATININE: 6.11 mg/dL — AB (ref 0.61–1.24)
Calcium: 8 mg/dL — ABNORMAL LOW (ref 8.9–10.3)
GFR calc Af Amer: 12 mL/min — ABNORMAL LOW (ref >60)
GFR calc non Af Amer: 10 mL/min — ABNORMAL LOW (ref >60)
GLUCOSE: 194 mg/dL — AB (ref 65–99)
POTASSIUM: 3.8 mmol/L (ref 3.5–5.1)
Sodium: 130 mmol/L — ABNORMAL LOW (ref 135–145)

## 2016-01-08 LAB — BLOOD GAS, ARTERIAL
Acid-base deficit: 3.7 mmol/L — ABNORMAL HIGH (ref 0.0–2.0)
Allens test (pass/fail): POSITIVE — AB
BICARBONATE: 20.3 meq/L — AB (ref 21.0–28.0)
Delivery systems: POSITIVE
EXPIRATORY PAP: 6
FIO2: 1
Inspiratory PAP: 16
O2 SAT: 99.9 %
PCO2 ART: 32 mmHg (ref 32.0–48.0)
PH ART: 7.41 (ref 7.350–7.450)
PO2 ART: 285 mmHg — AB (ref 83.0–108.0)
Patient temperature: 37

## 2016-01-08 LAB — CBC
HCT: 27.3 % — ABNORMAL LOW (ref 40.0–52.0)
HEMOGLOBIN: 8.6 g/dL — AB (ref 13.0–18.0)
MCH: 28.1 pg (ref 26.0–34.0)
MCHC: 31.4 g/dL — AB (ref 32.0–36.0)
MCV: 89.5 fL (ref 80.0–100.0)
Platelets: 109 10*3/uL — ABNORMAL LOW (ref 150–440)
RBC: 3.05 MIL/uL — AB (ref 4.40–5.90)
RDW: 17.1 % — ABNORMAL HIGH (ref 11.5–14.5)
WBC: 24.6 10*3/uL — ABNORMAL HIGH (ref 3.8–10.6)

## 2016-01-08 LAB — GLUCOSE, CAPILLARY
GLUCOSE-CAPILLARY: 65 mg/dL (ref 65–99)
GLUCOSE-CAPILLARY: 115 mg/dL — AB (ref 65–99)
GLUCOSE-CAPILLARY: 153 mg/dL — AB (ref 65–99)
Glucose-Capillary: 20 mg/dL — CL (ref 65–99)
Glucose-Capillary: 72 mg/dL (ref 65–99)
Glucose-Capillary: 115 mg/dL — ABNORMAL HIGH (ref 65–99)
Glucose-Capillary: 115 mg/dL — ABNORMAL HIGH (ref 65–99)
Glucose-Capillary: 144 mg/dL — ABNORMAL HIGH (ref 65–99)
Glucose-Capillary: 193 mg/dL — ABNORMAL HIGH (ref 65–99)
Glucose-Capillary: 178 mg/dL — ABNORMAL HIGH (ref 65–99)

## 2016-01-08 LAB — PHOSPHORUS: PHOSPHORUS: 5.6 mg/dL — AB (ref 2.5–4.6)

## 2016-01-08 MED ORDER — CHLORHEXIDINE GLUCONATE 0.12 % MT SOLN
15.0000 mL | Freq: Two times a day (BID) | OROMUCOSAL | Status: DC
  Administered 2016-01-08 – 2016-01-16 (×12): 15 mL via OROMUCOSAL
  Filled 2016-01-08 (×16): qty 15

## 2016-01-08 MED ORDER — EPOETIN ALFA 10000 UNIT/ML IJ SOLN
10000.0000 [IU] | INTRAMUSCULAR | Status: DC
  Administered 2016-01-08 – 2016-01-14 (×3): 10000 [IU] via INTRAVENOUS
  Filled 2016-01-08: qty 1

## 2016-01-08 MED ORDER — CETYLPYRIDINIUM CHLORIDE 0.05 % MT LIQD
7.0000 mL | Freq: Two times a day (BID) | OROMUCOSAL | Status: DC
  Administered 2016-01-08 – 2016-01-16 (×10): 7 mL via OROMUCOSAL

## 2016-01-08 MED ORDER — DEXTROSE 50 % IV SOLN: 50.0000 mL | INTRAVENOUS | Status: AC

## 2016-01-08 MED ORDER — DEXTROSE 50 % IV SOLN
50.0000 mL | Freq: Once | INTRAVENOUS | Status: AC
  Administered 2016-01-08: 50 mL via INTRAVENOUS

## 2016-01-08 MED ORDER — DEXTROSE 50 % IV SOLN
INTRAVENOUS | Status: AC
  Administered 2016-01-08: 50 mL via INTRAVENOUS
  Filled 2016-01-08: qty 50

## 2016-01-08 MED ORDER — DEXTROSE 50 % IV SOLN
INTRAVENOUS | Status: AC
  Administered 2016-01-08: 50 mL
  Filled 2016-01-08: qty 50

## 2016-01-08 NOTE — Progress Notes (Signed)
HD Tx Patient & Machine Checks

## 2016-01-08 NOTE — Progress Notes (Signed)
Responded to code blue.  Appears to be respiratory arrest.  No loss of pulse or BP.  Breath sounds course; rales throughout lung fields bilaterally. He is a dialysis patient and has not been dialyzed. Has received fluid earlier today for hypotension which is resolved.  Fluid is discontinued.  Concern for aspiration as well.  CXR ordered.  Anti-emetic and NG to be placed.  Start BiPAP for respiratory rate and work of breathing.  Place patient on dialysis list for today.

## 2016-01-08 NOTE — Progress Notes (Signed)
Pre HD Tx Assessment 

## 2016-01-08 NOTE — Care Management (Signed)
Off Levophed.  Bipapp was required earlier today.  Now on nasal cannula

## 2016-01-08 NOTE — H&P (Signed)
[facility] PULM/ICU CONSULT   PATIENT NAME: Taylor [name]    MR#:  [id number]  DATE OF BIRTH:  03-Sep-1971  DATE OF ADMISSION:  01/07/2016  PRIMARY CARE PHYSICIAN: [name], MD   REQUESTING/REFERRING PHYSICIAN: Dr Darnelle Catalan  CHIEF COMPLAINT:  Lethargic,resp distress  HISTORY OF PRESENT ILLNESS:  Patient with acute resp distress last night, placed on biPAP fio2 100% Lethargic, ABG pending, patient is DNR/DNI     DRUG ALLERGIES:   Allergies  Allergen Reactions  . Amoxicillin-Pot Clavulanate Nausea And Vomiting and Itching  . 2nd Skin Quick Heal Other (See Comments)  . Rifampin Nausea And Vomiting  . Tape Other (See Comments)    REVIEW OF SYSTEMS:  Review of Systems  Unable to perform ROS: critical illness      VITAL SIGNS:  Blood pressure 90/56, pulse 76, temperature 99.1 F (37.3 C), temperature source Axillary, resp. rate 14, height 5\' 11"  (1.803 m), weight 183 lb (83.008 kg), SpO2 100 %.  PHYSICAL EXAMINATION:  GENERAL:  45 y.o.-year-old patient lying in the bed with+res/ acute distress. Appears critically ill on biPAP EYES: Pupils equal, round, reactive to light and accommodation. No scleral icterus. Extraocular muscles intact. Pallor HEENT: Head atraumatic, normocephalic. Oropharynx and nasopharynx clear.  NECK:  Supple, no jugular venous distention. No thyroid enlargement, no tenderness.  LUNGS: Normal breath sounds bilaterally, no wheezing, rales,rhonchi or crepitation. No use of accessory muscles of respiration.  CARDIOVASCULAR: S1, S2 normal. No murmurs, rubs, or gallops.  ABDOMEN: Soft, nontender, distended. Bowel sounds present. No organomegaly or mass.  EXTREMITIES: ++pedal edema,  nocyanosis, or clubbing. No toes  LABORATORY PANEL:   CBC  Recent Labs Lab 01/07/16 0633  WBC 16.8*  HGB 8.6*  HCT 27.8*  PLT 116*   ------------------------------------------------------------------------------------------------------------------  Chemistries    Recent Labs Lab 01/07/16 0633  NA 132*  K 3.4*  CL 95*  CO2 22  GLUCOSE 82  BUN 52*  CREATININE 5.13*  CALCIUM 8.0*  AST 21  ALT 10*  ALKPHOS 900*  BILITOT 0.8   ------------------------------------------------------------------------------------------------------------------  Cardiac Enzymes  Recent Labs Lab 01/07/16 0633  TROPONINI 0.08*   ------------------------------------------------------------------------------------------------------------------  RADIOLOGY:  Dg Abd 1 View  01/08/2016  CLINICAL DATA:  Orogastric tube placement.  Initial encounter. EXAM: ABDOMEN - 1 VIEW COMPARISON:  Abdominal radiograph performed 11/25/2015 FINDINGS: The patient's enteric tube is noted ending overlying the body of the stomach. The stomach is partially filled with solid material. The visualized bowel gas pattern is unremarkable. Scattered air and stool filled loops of colon are seen; no abnormal dilatation of small bowel loops is seen to suggest small bowel obstruction. No free intra-abdominal air is identified, though evaluation for free air is limited on a single supine view. The visualized osseous structures are within normal limits; the sacroiliac joints are unremarkable in appearance. The patient is status post median sternotomy. IMPRESSION: 1. Enteric tube noted ending overlying the body of the stomach. 2. Unremarkable bowel gas pattern; no free intra-abdominal air seen. Small to moderate amount of stool noted in the colon. Electronically Signed   By: 11/27/2015 M.D.   On: 01/08/2016 05:53   Ct Head Wo Contrast  01/07/2016  CLINICAL DATA:  Acute onset of headache and altered mental status. Loss of consciousness. Initial encounter. EXAM: CT HEAD WITHOUT CONTRAST TECHNIQUE: Contiguous axial images were obtained from the base of the skull through the vertex without intravenous contrast. COMPARISON:  CT of the head performed 06/13/2015 FINDINGS: There is no evidence of acute  infarction, mass  lesion, or intra- or extra-axial hemorrhage on CT. The posterior fossa, including the cerebellum, brainstem and fourth ventricle, is within normal limits. The third and lateral ventricles, and basal ganglia are unremarkable in appearance. The cerebral hemispheres are symmetric in appearance, with normal gray-white differentiation. No mass effect or midline shift is seen. There is no evidence of fracture; visualized osseous structures are unremarkable in appearance. The visualized portions of the orbits are within normal limits. There is partial opacification of the mastoid air cells bilaterally, and partial opacification of the left frontal sinus. The remaining paranasal sinuses are well-aerated. No significant soft tissue abnormalities are seen. IMPRESSION: 1. No acute intracranial pathology seen on CT. 2. Partial opacification of the mastoid air cells bilaterally, and partial opacification of the left frontal sinus. Electronically Signed   By: Roanna Raider M.D.   On: 01/07/2016 06:24   Dg Chest Port 1 View  01/08/2016  CLINICAL DATA:  Acute onset of hypoxia.  Initial encounter. EXAM: PORTABLE CHEST 1 VIEW COMPARISON:  Chest radiograph from 01/07/2016 FINDINGS: The lungs are mildly hypoexpanded. Mild bibasilar opacities may reflect mild pneumonia. There is no evidence of pleural effusion or pneumothorax. The cardiomediastinal silhouette is borderline enlarged. The patient is status post median sternotomy, with evidence of prior CABG. No acute osseous abnormalities are seen. IMPRESSION: Lungs mildly hypoexpanded. Mild bibasilar opacities may reflect mild pneumonia. Borderline cardiomegaly. Electronically Signed   By: Roanna Raider M.D.   On: 01/08/2016 05:54   Dg Chest Portable 1 View  01/07/2016  CLINICAL DATA:  Acute onset of headache and altered mental status. Loss of consciousness. Initial encounter. EXAM: PORTABLE CHEST 1 VIEW COMPARISON:  Chest radiograph performed 10/10/2015 FINDINGS:  The lungs are well-aerated. Mild vascular congestion is noted. Mild bibasilar opacities likely reflect atelectasis. There is no evidence of pleural effusion or pneumothorax. The cardiomediastinal silhouette is borderline enlarged. The patient is status post median sternotomy, with evidence of prior CABG. No acute osseous abnormalities are seen. IMPRESSION: Mild vascular congestion and borderline cardiomegaly. Mild bibasilar opacities likely reflect atelectasis. Electronically Signed   By: Roanna Raider M.D.   On: 01/07/2016 06:06     IMPRESSION AND PLAN:   45 y.o. 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 Sepsis/septic shock suspected source urine given pyuria is seen in the small amount of urine that's produced in the Foley bag -IV fluids for hydration.  -IV levo fed to keep MAP>65 -Follow blood culture urine culture urinalysis -Pharmacy consult for IV antibiotics  -CODE SEPSIS follow up LA 2.9-->2.4  2. ESRD on hemodialysis -Nephrology consultation for in-house dialysis  3. History of suprapubic catheter secondary to urethrocutaneous fistula chronic -follow up urology recs  4.resp failure from sepsis,hypoxia -follow up cxr, biPAP as needed, follow up ABG   I have personally obtained a history, examined the patient, evaluated Pertinent laboratory and RadioGraphic/imaging results, and  formulated the assessment and plan   The Patient requires high complexity decision making for assessment and support, frequent evaluation and titration of therapies, application of advanced monitoring technologies and extensive interpretation of multiple databases. Critical Care Time devoted to patient care services described in this note is 45 minutes.    Overall, patient is critically ill, prognosis is guarded.  Patient with Multiorgan failure and at high risk for cardiac arrest and  death.    Corrin Parker, M.D.  [facility]  Medical Director [place] Director [facility] Cardio-Pulmonary Department

## 2016-01-08 NOTE — Progress Notes (Signed)
[facility]  ROUNDING NOTE   Subjective:  Patient well known to [name].  We follow him closely for outpt HD.   He had recent [facility] placement.  Since that time he's noted decreased urine outpt.  He's had increasing weakness over the past week.  SPC catheter tubing revealed what appeared to be purulent urine. He slumped over while getting ready to go to dialysis.  Brought to ED subsequently when code sepsis was called as he was hypothermic and hypotensive.  After initial resuscitation pt doing better.     Objective:  Vital signs in last 24 hours:  Temp:  [97.5 F (36.4 C)-99.1 F (37.3 C)] 99.1 F (37.3 C) (03/01 0759) Pulse Rate:  [47-82] 76 (03/01 0800) Resp:  [14-30] 14 (03/01 0800) BP: (84-117)/(46-64) 90/56 mmHg (03/01 0803) SpO2:  [91 %-100 %] 100 % (03/01 0800) FiO2 (%):  [100 %] 100 % (03/01 0443)  Weight change:  Filed Weights   01/07/16 0534  Weight: 83.008 kg (183 lb)    Intake/Output: I/O last 3 completed shifts: In: 2204.8 [P.O.:240; I.V.:1514.8; IV Piggyback:450] Out: 26 [Urine:25; Stool:1]   Intake/Output this shift:  Total I/O In: 3.8 [I.V.:3.8] Out: -   Physical Exam: General: NAD, resting in bed  Head: Normocephalic, atraumatic. Moist oral mucosal membranes  Eyes: Anicteric, PERRL  Neck: Supple, trachea midline  Lungs:  Clear to auscultation normal effort  Heart: S1S2 no rubs  Abdomen:  Soft, nontender, BS present  Extremities: trace peripheral edema.  Neurologic: Nonfocal, moving all four extremities  Skin: No lesions  Access: LUE AVF    Basic Metabolic Panel:  Recent Labs Lab 01/07/16 0633  NA 132*  K 3.4*  CL 95*  CO2 22  GLUCOSE 82  BUN 52*  CREATININE 5.13*  CALCIUM 8.0*    Liver Function Tests:  Recent Labs Lab 01/07/16 0633  AST 21  ALT 10*  ALKPHOS 900*  BILITOT 0.8  PROT 6.4*  ALBUMIN 2.2*   No results for input(s): LIPASE, AMYLASE in the last 168 hours. No results for input(s): AMMONIA in the last  168 hours.  CBC:  Recent Labs Lab 01/07/16 0633  WBC 16.8*  HGB 8.6*  HCT 27.8*  MCV 89.7  PLT 116*    Cardiac Enzymes:  Recent Labs Lab 01/07/16 0633  TROPONINI 0.08*    BNP: Invalid input(s): POCBNP  CBG:  Recent Labs Lab 01/08/16 0511 01/08/16 0547 01/08/16 0647 01/08/16 0738 01/08/16 0857  GLUCAP 20* 72 65 115* 115*    Microbiology: Results for orders placed or performed during the hospital encounter of 01/07/16  Blood Culture (routine x 2)     Status: None (Preliminary result)   Collection Time: 01/07/16  6:33 AM  Result Value Ref Range Status   Specimen Description BLOOD RTA  Final   Special Requests   Final    BOTTLES DRAWN AEROBIC AND ANAEROBIC AER ANA   Culture NO GROWTH < 12 HOURS  Final   Report Status PENDING  Incomplete  Blood Culture (routine x 2)     Status: None (Preliminary result)   Collection Time: 01/07/16  6:58 AM  Result Value Ref Range Status   Specimen Description BLOOD RIGHT ARM  Final   Special Requests   Final    BOTTLES DRAWN AEROBIC AND ANAEROBIC AER ANA   Culture NO GROWTH < 12 HOURS  Final   Report Status PENDING  Incomplete  MRSA PCR Screening     Status: None  Collection Time: 01/07/16 12:20 PM  Result Value Ref Range Status   MRSA by PCR NEGATIVE NEGATIVE Final    Comment:        The GeneXpert MRSA Assay (FDA approved for NASAL specimens only), is one component of a comprehensive MRSA colonization surveillance program. It is not intended to diagnose MRSA infection nor to guide or monitor treatment for MRSA infections.     Coagulation Studies: No results for input(s): LABPROT, INR in the last 72 hours.  Urinalysis:  Recent Labs  01/07/16 1045  COLORURINE YELLOW*  LABSPEC 1.016  PHURINE 5.0  GLUCOSEU 50*  HGBUR 2+*  BILIRUBINUR NEGATIVE  KETONESUR NEGATIVE  PROTEINUR 100*  NITRITE NEGATIVE  LEUKOCYTESUR 2+*      Imaging: Dg Abd 1 View  01/08/2016  CLINICAL DATA:  Orogastric  tube placement.  Initial encounter. EXAM: ABDOMEN - 1 VIEW COMPARISON:  Abdominal radiograph performed 11/25/2015 FINDINGS: The patient's enteric tube is noted ending overlying the body of the stomach. The stomach is partially filled with solid material. The visualized bowel gas pattern is unremarkable. Scattered air and stool filled loops of colon are seen; no abnormal dilatation of small bowel loops is seen to suggest small bowel obstruction. No free intra-abdominal air is identified, though evaluation for free air is limited on a single supine view. The visualized osseous structures are within normal limits; the sacroiliac joints are unremarkable in appearance. The patient is status post median sternotomy. IMPRESSION: 1. Enteric tube noted ending overlying the body of the stomach. 2. Unremarkable bowel gas pattern; no free intra-abdominal air seen. Small to moderate amount of stool noted in the colon. Electronically Signed   By: Roanna Raider M.D.   On: 01/08/2016 05:53   Ct Head Wo Contrast  01/07/2016  CLINICAL DATA:  Acute onset of headache and altered mental status. Loss of consciousness. Initial encounter. EXAM: CT HEAD WITHOUT CONTRAST TECHNIQUE: Contiguous axial images were obtained from the base of the skull through the vertex without intravenous contrast. COMPARISON:  CT of the head performed 06/13/2015 FINDINGS: There is no evidence of acute infarction, mass lesion, or intra- or extra-axial hemorrhage on CT. The posterior fossa, including the cerebellum, brainstem and fourth ventricle, is within normal limits. The third and lateral ventricles, and basal ganglia are unremarkable in appearance. The cerebral hemispheres are symmetric in appearance, with normal gray-white differentiation. No mass effect or midline shift is seen. There is no evidence of fracture; visualized osseous structures are unremarkable in appearance. The visualized portions of the orbits are within normal limits. There is partial  opacification of the mastoid air cells bilaterally, and partial opacification of the left frontal sinus. The remaining paranasal sinuses are well-aerated. No significant soft tissue abnormalities are seen. IMPRESSION: 1. No acute intracranial pathology seen on CT. 2. Partial opacification of the mastoid air cells bilaterally, and partial opacification of the left frontal sinus. Electronically Signed   By: Roanna Raider M.D.   On: 01/07/2016 06:24   Dg Chest Port 1 View  01/08/2016  CLINICAL DATA:  Acute onset of hypoxia.  Initial encounter. EXAM: PORTABLE CHEST 1 VIEW COMPARISON:  Chest radiograph from 01/07/2016 FINDINGS: The lungs are mildly hypoexpanded. Mild bibasilar opacities may reflect mild pneumonia. There is no evidence of pleural effusion or pneumothorax. The cardiomediastinal silhouette is borderline enlarged. The patient is status post median sternotomy, with evidence of prior CABG. No acute osseous abnormalities are seen. IMPRESSION: Lungs mildly hypoexpanded. Mild bibasilar opacities may reflect mild pneumonia. Borderline cardiomegaly. Electronically Signed  By: Roanna Raider M.D.   On: 01/08/2016 05:54   Dg Chest Portable 1 View  01/07/2016  CLINICAL DATA:  Acute onset of headache and altered mental status. Loss of consciousness. Initial encounter. EXAM: PORTABLE CHEST 1 VIEW COMPARISON:  Chest radiograph performed 10/10/2015 FINDINGS: The lungs are well-aerated. Mild vascular congestion is noted. Mild bibasilar opacities likely reflect atelectasis. There is no evidence of pleural effusion or pneumothorax. The cardiomediastinal silhouette is borderline enlarged. The patient is status post median sternotomy, with evidence of prior CABG. No acute osseous abnormalities are seen. IMPRESSION: Mild vascular congestion and borderline cardiomegaly. Mild bibasilar opacities likely reflect atelectasis. Electronically Signed   By: Roanna Raider M.D.   On: 01/07/2016 06:06     Medications:   .  sodium chloride 75 mL/hr at 01/08/16 0629  . norepinephrine (LEVOPHED) Adult infusion 2 mcg/min (01/08/16 0312)   . antiseptic oral rinse  7 mL Mouth Rinse q12n4p  . aspirin EC  81 mg Oral Daily  . calcium-vitamin D  1 tablet Oral Daily  . cefTRIAXone (ROCEPHIN)  IV  2 g Intravenous Q24H  . chlorhexidine  15 mL Mouth Rinse BID  . cinacalcet  30 mg Oral Q supper  . citalopram  20 mg Oral Daily  . clopidogrel  75 mg Oral Daily  . docusate sodium  100 mg Oral BID  . heparin  5,000 Units Subcutaneous 3 times per day  . insulin aspart  1-3 Units Subcutaneous 6 times per day  . insulin glargine  8 Units Subcutaneous QHS  . levothyroxine  75 mcg Oral Q0600  . omega-3 acid ethyl esters  1 g Oral Daily  . pantoprazole  40 mg Oral Daily  . pregabalin  75 mg Oral BID  . rosuvastatin  20 mg Oral QHS  . sevelamer carbonate  800 mg Oral TID WC  . traZODone  50 mg Oral QHS   acetaminophen **OR** acetaminophen, HYDROcodone-acetaminophen, morphine injection, naLOXone (NARCAN)  injection, ondansetron **OR** ondansetron (ZOFRAN) IV  Assessment/ Plan:  45 y.o. 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 TTS  1.  ESRD on HD TTHS:  We will attempt a trial of hemodialysis today, blood pressures remain low,  If unable to tolerated may need to consider CRRT.  2.  Septic shock:  Suspect urinary source.  Cultures pending.  Discussed with pharmacy, would consider adding back vancomcyin as pt is a dialysis pt.   3.  Anemia of CKD:  hgb 8.6, will start epogen with HD.  4.  SHPTH:  Check ipth and phos with HD today.   5.  Overall  remains critically ill.     LOS: 1 Denina Rieger 3/1/20179:11 AM

## 2016-01-08 NOTE — Progress Notes (Signed)
[facility] - [place] at [facility]   PATIENT NAME: Taylor [name]    MR#:  [id number]  DATE OF BIRTH:  23-Jun-1971  SUBJECTIVE:  CHIEF COMPLAINT:   Chief Complaint  Patient presents with  . Headache     Came septic, hypotensive on levophed. Have Ch suprapubic catheter and foley   BP is stable now. He is worried , if he have C diff. Though he does not have any diarrhea.   Had episode of Hypoxia and fluid overload this morning, now O2 stable.  REVIEW OF SYSTEMS:  CONSTITUTIONAL: No fever, fatigue or weakness.  EYES: No blurred or double vision.  EARS, NOSE, AND THROAT: No tinnitus or ear pain.  RESPIRATORY: No cough, shortness of breath, wheezing or hemoptysis.  CARDIOVASCULAR: No chest pain, orthopnea, edema.  GASTROINTESTINAL: No nausea, vomiting, diarrhea or abdominal pain.  GENITOURINARY: No dysuria, hematuria.  ENDOCRINE: No polyuria, nocturia,  HEMATOLOGY: No anemia, easy bruising or bleeding SKIN: No rash or lesion. MUSCULOSKELETAL: No joint pain or arthritis.   NEUROLOGIC: No tingling, numbness, weakness.  PSYCHIATRY: No anxiety or depression.   ROS  DRUG ALLERGIES:   Allergies  Allergen Reactions  . Amoxicillin-Pot Clavulanate Nausea And Vomiting and Itching  . 2nd Skin Quick Heal Other (See Comments)  . Rifampin Nausea And Vomiting  . Tape Other (See Comments)    VITALS:  Blood pressure 105/57, pulse 68, temperature 97.7 F (36.5 C), temperature source Oral, resp. rate 18, height 5\' 11"  (1.803 m), weight 83.008 kg (183 lb), SpO2 100 %.  PHYSICAL EXAMINATION:   GENERAL: 45 y.o.-year-old patient lying in the bed with no acute distress. Appears critically ill EYES: Pupils equal, round, reactive to light and accommodation. No scleral icterus. Extraocular muscles intact. Pallor HEENT: Head atraumatic, normocephalic. Oropharynx and nasopharynx clear.  NECK: Supple, no jugular venous distention. No thyroid enlargement, no  tenderness.  LUNGS: Normal breath sounds bilaterally, no wheezing, rales,rhonchi or crepitation. No use of accessory muscles of respiration.  CARDIOVASCULAR: S1, S2 normal. No murmurs, rubs, or gallops.  ABDOMEN: Soft, nontender, distended. Bowel sounds present. No organomegaly or mass. supra pubic catheter present. EXTREMITIES: ++pedal edema, nocyanosis, or clubbing.  NEUROLOGIC: Cranial nerves II through XII are intact. Subjective weakness. Unable to assess muscle strength at present. Sensation intact. Gait not checked.  PSYCHIATRIC: The patient is alert and oriented x 3.  SKIN: No obvious rash, lesion, or ulcer.  Patient has chronic skin changes in the lower extremity with venous stasis changes.   Physical Exam LABORATORY PANEL:   CBC  Recent Labs Lab 01/08/16 1835  WBC 24.6*  HGB 8.6*  HCT 27.3*  PLT 109*   ------------------------------------------------------------------------------------------------------------------  Chemistries   Recent Labs Lab 01/07/16 0633  NA 132*  K 3.4*  CL 95*  CO2 22  GLUCOSE 82  BUN 52*  CREATININE 5.13*  CALCIUM 8.0*  AST 21  ALT 10*  ALKPHOS 900*  BILITOT 0.8   ------------------------------------------------------------------------------------------------------------------  Cardiac Enzymes  Recent Labs Lab 01/07/16 0633  TROPONINI 0.08*   ------------------------------------------------------------------------------------------------------------------  RADIOLOGY:  Dg Abd 1 View  01/08/2016  CLINICAL DATA:  Orogastric tube placement.  Initial encounter. EXAM: ABDOMEN - 1 VIEW COMPARISON:  Abdominal radiograph performed 11/25/2015 FINDINGS: The patient's enteric tube is noted ending overlying the body of the stomach. The stomach is partially filled with solid material. The visualized bowel gas pattern is unremarkable. Scattered air and stool filled loops of colon are seen; no abnormal dilatation of small bowel loops  is seen  to suggest small bowel obstruction. No free intra-abdominal air is identified, though evaluation for free air is limited on a single supine view. The visualized osseous structures are within normal limits; the sacroiliac joints are unremarkable in appearance. The patient is status post median sternotomy. IMPRESSION: 1. Enteric tube noted ending overlying the body of the stomach. 2. Unremarkable bowel gas pattern; no free intra-abdominal air seen. Small to moderate amount of stool noted in the colon. Electronically Signed   By: Roanna Raider M.D.   On: 01/08/2016 05:53   Ct Head Wo Contrast  01/07/2016  CLINICAL DATA:  Acute onset of headache and altered mental status. Loss of consciousness. Initial encounter. EXAM: CT HEAD WITHOUT CONTRAST TECHNIQUE: Contiguous axial images were obtained from the base of the skull through the vertex without intravenous contrast. COMPARISON:  CT of the head performed 06/13/2015 FINDINGS: There is no evidence of acute infarction, mass lesion, or intra- or extra-axial hemorrhage on CT. The posterior fossa, including the cerebellum, brainstem and fourth ventricle, is within normal limits. The third and lateral ventricles, and basal ganglia are unremarkable in appearance. The cerebral hemispheres are symmetric in appearance, with normal gray-white differentiation. No mass effect or midline shift is seen. There is no evidence of fracture; visualized osseous structures are unremarkable in appearance. The visualized portions of the orbits are within normal limits. There is partial opacification of the mastoid air cells bilaterally, and partial opacification of the left frontal sinus. The remaining paranasal sinuses are well-aerated. No significant soft tissue abnormalities are seen. IMPRESSION: 1. No acute intracranial pathology seen on CT. 2. Partial opacification of the mastoid air cells bilaterally, and partial opacification of the left frontal sinus. Electronically Signed    By: Roanna Raider M.D.   On: 01/07/2016 06:24   Dg Chest Port 1 View  01/08/2016  CLINICAL DATA:  Acute onset of hypoxia.  Initial encounter. EXAM: PORTABLE CHEST 1 VIEW COMPARISON:  Chest radiograph from 01/07/2016 FINDINGS: The lungs are mildly hypoexpanded. Mild bibasilar opacities may reflect mild pneumonia. There is no evidence of pleural effusion or pneumothorax. The cardiomediastinal silhouette is borderline enlarged. The patient is status post median sternotomy, with evidence of prior CABG. No acute osseous abnormalities are seen. IMPRESSION: Lungs mildly hypoexpanded. Mild bibasilar opacities may reflect mild pneumonia. Borderline cardiomegaly. Electronically Signed   By: Roanna Raider M.D.   On: 01/08/2016 05:54   Dg Chest Portable 1 View  01/07/2016  CLINICAL DATA:  Acute onset of headache and altered mental status. Loss of consciousness. Initial encounter. EXAM: PORTABLE CHEST 1 VIEW COMPARISON:  Chest radiograph performed 10/10/2015 FINDINGS: The lungs are well-aerated. Mild vascular congestion is noted. Mild bibasilar opacities likely reflect atelectasis. There is no evidence of pleural effusion or pneumothorax. The cardiomediastinal silhouette is borderline enlarged. The patient is status post median sternotomy, with evidence of prior CABG. No acute osseous abnormalities are seen. IMPRESSION: Mild vascular congestion and borderline cardiomegaly. Mild bibasilar opacities likely reflect atelectasis. Electronically Signed   By: Roanna Raider M.D.   On: 01/07/2016 06:06    ASSESSMENT AND PLAN:   Active Problems:   Sepsis (HCC)  1. Sepsis/septic shock - source urine given pyuria  -Urinalysis positive -IV fluids for hydration. Limit IV fluids as for now since patient is dialysis.  -IV levo fed- now off. -Follow blood culture urine culture urinalysis -Pharmacy consult for IV antibiotics  On admission given  vancomycin, levofloxacin and aztreonam  Changed to rocephin for now.  2. ESRD  on hemodialysis -Nephrology  consultation for in-house dialysis -Potassium is 3.4  3. 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 Thea Silversmith from [facility] urology to see patient for suprapubic catheter functioning -He recommends to get bladder scan done to see patient has urinary retention  4. Hypotension hold off on Coreg, Imdur, lisinopril  5. Headaches with sinus pain with history of sinusitis appears chronic -When necessary Norco  6. Anemia of chronic disease  7. DVT prophylaxis Subcutaneous heparin     All the records are reviewed and case discussed with Care Management/Social Workerr. Management plans discussed with the patient, family and they are in agreement.  CODE STATUS: [place].  TOTAL TIME TAKING CARE OF THIS PATIENT: 35 minutes.     POSSIBLE D/C IN 1-2 DAYS, DEPENDING ON CLINICAL CONDITION.   Altamese Dilling M.D on 01/08/2016   Between 7am to 6pm - Pager - [phone]  After 6pm go to [email or web address] - password [email or web address]  [facility]  Office  [phone]  CC: Primary care physician; [name], MD  Note: This dictation was prepared with Dragon dictation along with smaller phrase technology. Any transcriptional errors that result from this process are unintentional.

## 2016-01-08 NOTE — Progress Notes (Signed)
HD TX Termination 

## 2016-01-08 NOTE — Progress Notes (Signed)
Pharmacy Antibiotic Note  JOSEPHINE RUDNICK is a 45 y.o. male admitted on 01/07/2016 with septic shock, pyuria.  Pharmacy has been consulted for ceftriaxone dosing.  Plan: Ceftriaxone 2 g iv q 24 hours.   Height: 5\' 11"  (180.3 cm) Weight: 183 lb (83.008 kg) IBW/kg (Calculated) : 75.3  Temp (24hrs), Avg:98.3 F (36.8 C), Min:97.5 F (36.4 C), Max:99.1 F (37.3 C)   Recent Labs Lab 01/07/16 [phone] 01/07/16 0851 01/07/16 1313  WBC 16.8*  --   --   CREATININE 5.13*  --   --   LATICACIDVEN  --  2.9* 2.4*    Estimated Creatinine Clearance: 19.2 mL/min (by C-G formula based on Cr of 5.13).    Allergies  Allergen Reactions  . Amoxicillin-Pot Clavulanate Nausea And Vomiting and Itching  . 2nd Skin Quick Heal Other (See Comments)  . Rifampin Nausea And Vomiting  . Tape Other (See Comments)    Antimicrobials this admission: Levaquin/vanc/aztreonam  02/28 >> 02/28 Ceftriaxone 02/28 >>  Dose adjustments this admission:   Microbiology results: 02/28 Blood cx: NGTD x 2 02/28 UCx: negative  02/28 MRSA PCR negative  Thank you for allowing pharmacy to be a part of this patient's care.  3/28 D 01/08/2016 11:20 AM

## 2016-01-08 NOTE — Progress Notes (Signed)
Post HD Tx Assessment  

## 2016-01-08 NOTE — Progress Notes (Addendum)
Updated Dr. Belia Heman that patient became unresponsive last night-patient placed on 100% fi02 bipap and continues to be on 100%- patient had a blood sugar of 20 early this morning- patient still very lethargic- order for a ABG and blood sugars q1 hr.

## 2016-01-08 NOTE — Progress Notes (Signed)
Post HD TX Assessment 

## 2016-01-08 NOTE — Progress Notes (Signed)
HD Tx Initiation 

## 2016-01-08 NOTE — Progress Notes (Signed)
Asked Dr. Belia Heman if he would like CT of patient's head and patient also had no lab work ordered- Dr. Belia Heman ordered no CT and labs can be drawn during dialysis.

## 2016-01-09 DIAGNOSIS — G9340 Encephalopathy, unspecified: Secondary | ICD-10-CM

## 2016-01-09 LAB — HEPATITIS B SURFACE ANTIGEN: HEP B S AG: NEGATIVE

## 2016-01-09 LAB — CBC
HCT: 29.3 % — ABNORMAL LOW (ref 40.0–52.0)
Hemoglobin: 9.4 g/dL — ABNORMAL LOW (ref 13.0–18.0)
MCH: 28.4 pg (ref 26.0–34.0)
MCHC: 31.9 g/dL — ABNORMAL LOW (ref 32.0–36.0)
MCV: 89.1 fL (ref 80.0–100.0)
PLATELETS: 110 10*3/uL — AB (ref 150–440)
RBC: 3.29 MIL/uL — AB (ref 4.40–5.90)
RDW: 16.9 % — ABNORMAL HIGH (ref 11.5–14.5)
WBC: 15.6 10*3/uL — AB (ref 3.8–10.6)

## 2016-01-09 LAB — GLUCOSE, CAPILLARY
GLUCOSE-CAPILLARY: 144 mg/dL — AB (ref 65–99)
GLUCOSE-CAPILLARY: 171 mg/dL — AB (ref 65–99)
GLUCOSE-CAPILLARY: 229 mg/dL — AB (ref 65–99)
Glucose-Capillary: 109 mg/dL — ABNORMAL HIGH (ref 65–99)
Glucose-Capillary: 133 mg/dL — ABNORMAL HIGH (ref 65–99)
Glucose-Capillary: 138 mg/dL — ABNORMAL HIGH (ref 65–99)
Glucose-Capillary: 242 mg/dL — ABNORMAL HIGH (ref 65–99)

## 2016-01-09 LAB — BASIC METABOLIC PANEL
ANION GAP: 12 (ref 5–15)
BUN: 36 mg/dL — ABNORMAL HIGH (ref 6–20)
CO2: 25 mmol/L (ref 22–32)
Calcium: 7.9 mg/dL — ABNORMAL LOW (ref 8.9–10.3)
Chloride: 98 mmol/L — ABNORMAL LOW (ref 101–111)
Creatinine, Ser: 4.03 mg/dL — ABNORMAL HIGH (ref 0.61–1.24)
GFR, EST AFRICAN AMERICAN: 19 mL/min — AB (ref >60)
GFR, EST NON AFRICAN AMERICAN: 16 mL/min — AB (ref >60)
Glucose, Bld: 118 mg/dL — ABNORMAL HIGH (ref 65–99)
POTASSIUM: 2.9 mmol/L — AB (ref 3.5–5.1)
SODIUM: 135 mmol/L (ref 135–145)

## 2016-01-09 LAB — LACTIC ACID, PLASMA
LACTIC ACID, VENOUS: 1.5 mmol/L (ref 0.5–2.0)
Lactic Acid, Venous: 1.4 mmol/L (ref 0.5–2.0)

## 2016-01-09 MED ORDER — POTASSIUM CHLORIDE 10 MEQ/50ML IV SOLN: 10.0000 meq | INTRAVENOUS | Status: DC

## 2016-01-09 MED ORDER — POTASSIUM CHLORIDE CRYS ER 20 MEQ PO TBCR
40.0000 meq | EXTENDED_RELEASE_TABLET | Freq: Once | ORAL | Status: AC
  Administered 2016-01-09: 40 meq via ORAL
  Filled 2016-01-09: qty 2

## 2016-01-09 NOTE — Progress Notes (Signed)
Pharmacy Antibiotic Note  Taylor [name] is a 45 y.o. male admitted on 01/07/2016 with septic shock, pyuria.  Pharmacy has been consulted for ceftriaxone dosing.  Plan: Continue ceftriaxone 2 g iv q 24 hours.   Height: 5\' 11"  (180.3 cm) Weight: 189 lb 13.1 oz (86.1 kg) IBW/kg (Calculated) : 75.3  Temp (24hrs), Avg:97.7 F (36.5 C), Min:97.4 F (36.3 C), Max:98.5 F (36.9 C)   Recent Labs Lab 01/07/16 0633 01/07/16 0851 01/07/16 1313 01/08/16 1835 01/09/16 0349 01/09/16 0847  WBC 16.8*  --   --  24.6* 15.6*  --   CREATININE 5.13*  --   --  6.11* 4.03*  --   LATICACIDVEN  --  2.9* 2.4*  --   --  1.5    Estimated Creatinine Clearance: 24.4 mL/min (by C-G formula based on Cr of 4.03).    Allergies  Allergen Reactions  . Amoxicillin-Pot Clavulanate Nausea And Vomiting and Itching  . 2nd Skin Quick Heal Other (See Comments)  . Rifampin Nausea And Vomiting  . Tape Other (See Comments)    Antimicrobials this admission: Levaquin/vanc/aztreonam  02/28 >> 02/28 Ceftriaxone 02/28 >>  Dose adjustments this admission:   Microbiology results: 02/28 Blood cx: NGTD x 2 02/28 UCx: pending 02/28 MRSA PCR negative  Thank you for allowing pharmacy to be a part of this patient's care.  3/28 D 01/09/2016 11:14 AM

## 2016-01-09 NOTE — Progress Notes (Addendum)
[facility] [place] [facility]   PATIENT NAME: Taylor [name]    MR#:  [id number]  DATE OF BIRTH:  03-13-1971  SUBJECTIVE:  CHIEF COMPLAINT:   Chief Complaint  Patient presents with  . Headache     Came septic, hypotensive on levophed. Have Ch suprapubic catheter and foley   BP is stable now.  does not have any diarrhea.   Had episode of Hypoxia and fluid overload this morning, now O2 stable.  REVIEW OF SYSTEMS:  CONSTITUTIONAL: No fever, fatigue or weakness.  EYES: No blurred or double vision.  EARS, NOSE, AND THROAT: No tinnitus or ear pain.  RESPIRATORY: No cough, shortness of breath, wheezing or hemoptysis.  CARDIOVASCULAR: No chest pain, orthopnea, edema.  GASTROINTESTINAL: No nausea, vomiting, diarrhea or abdominal pain.  GENITOURINARY: No dysuria, hematuria.  ENDOCRINE: No polyuria, nocturia,  HEMATOLOGY: No anemia, easy bruising or bleeding SKIN: No rash or lesion. MUSCULOSKELETAL: No joint pain or arthritis.   NEUROLOGIC: No tingling, numbness, weakness.  PSYCHIATRY: No anxiety or depression.   ROS  DRUG ALLERGIES:   Allergies  Allergen Reactions  . Amoxicillin-Pot Clavulanate Nausea And Vomiting and Itching  . 2nd Skin Quick Heal Other (See Comments)  . Rifampin Nausea And Vomiting  . Tape Other (See Comments)    VITALS:  Blood pressure 98/48, pulse 72, temperature 98 F (36.7 C), temperature source Oral, resp. rate 16, height 5\' 11"  (1.803 m), weight 86.1 kg (189 lb 13.1 oz), SpO2 94 %.  PHYSICAL EXAMINATION:   GENERAL: 45 y.o.-year-old patient lying in the bed with no acute distress. Appears critically ill EYES: Pupils equal, round, reactive to light and accommodation. No scleral icterus. Extraocular muscles intact. Pallor HEENT: Head atraumatic, normocephalic. Oropharynx and nasopharynx clear.  NECK: Supple, no jugular venous distention. No thyroid enlargement, no tenderness.  LUNGS: Normal breath sounds  bilaterally, no wheezing, rales,rhonchi or crepitation. No use of accessory muscles of respiration.  CARDIOVASCULAR: S1, S2 normal. No murmurs, rubs, or gallops.  ABDOMEN: Soft, nontender, distended. Bowel sounds present. No organomegaly or mass. supra pubic catheter present. EXTREMITIES: ++pedal edema, nocyanosis, or clubbing.  NEUROLOGIC: Cranial nerves II through XII are intact. Subjective weakness. Unable to assess muscle strength at present. Sensation intact. Gait not checked.  PSYCHIATRIC: The patient is alert and oriented x 3.  SKIN: No obvious rash, lesion, or ulcer.  Patient has chronic skin changes in the lower extremity with venous stasis changes.   Physical Exam LABORATORY PANEL:   CBC  Recent Labs Lab 01/09/16 0349  WBC 15.6*  HGB 9.4*  HCT 29.3*  PLT 110*   ------------------------------------------------------------------------------------------------------------------  Chemistries   Recent Labs Lab 01/07/16 0633  01/09/16 0349  NA 132*  < > 135  K 3.4*  < > 2.9*  CL 95*  < > 98*  CO2 22  < > 25  GLUCOSE 82  < > 118*  BUN 52*  < > 36*  CREATININE 5.13*  < > 4.03*  CALCIUM 8.0*  < > 7.9*  AST 21  --   --   ALT 10*  --   --   ALKPHOS 900*  --   --   BILITOT 0.8  --   --   < > = values in this interval not displayed. ------------------------------------------------------------------------------------------------------------------  Cardiac Enzymes  Recent Labs Lab 01/07/16 0633  TROPONINI 0.08*   ------------------------------------------------------------------------------------------------------------------  RADIOLOGY:  Dg Abd 1 View  01/08/2016  CLINICAL DATA:  Orogastric tube placement.  Initial encounter. EXAM:  ABDOMEN - 1 VIEW COMPARISON:  Abdominal radiograph performed 11/25/2015 FINDINGS: The patient's enteric tube is noted ending overlying the body of the stomach. The stomach is partially filled with solid material. The visualized  bowel gas pattern is unremarkable. Scattered air and stool filled loops of colon are seen; no abnormal dilatation of small bowel loops is seen to suggest small bowel obstruction. No free intra-abdominal air is identified, though evaluation for free air is limited on a single supine view. The visualized osseous structures are within normal limits; the sacroiliac joints are unremarkable in appearance. The patient is status post median sternotomy. IMPRESSION: 1. Enteric tube noted ending overlying the body of the stomach. 2. Unremarkable bowel gas pattern; no free intra-abdominal air seen. Small to moderate amount of stool noted in the colon. Electronically Signed   By: Roanna Raider M.D.   On: 01/08/2016 05:53   Dg Chest Port 1 View  01/08/2016  CLINICAL DATA:  Acute onset of hypoxia.  Initial encounter. EXAM: PORTABLE CHEST 1 VIEW COMPARISON:  Chest radiograph from 01/07/2016 FINDINGS: The lungs are mildly hypoexpanded. Mild bibasilar opacities may reflect mild pneumonia. There is no evidence of pleural effusion or pneumothorax. The cardiomediastinal silhouette is borderline enlarged. The patient is status post median sternotomy, with evidence of prior CABG. No acute osseous abnormalities are seen. IMPRESSION: Lungs mildly hypoexpanded. Mild bibasilar opacities may reflect mild pneumonia. Borderline cardiomegaly. Electronically Signed   By: Roanna Raider M.D.   On: 01/08/2016 05:54    ASSESSMENT AND PLAN:   Active Problems:   Sepsis (HCC)  1. Sepsis/septic shock - source urine given pyuria - UTI -Urinalysis positive -IV fluids for hydration.  -IV levophed- now off. -Follow blood culture urine culture urinalysis -Pharmacy consult for IV antibiotics  On admission given  vancomycin, levofloxacin and aztreonam  Changed to rocephin for now. - waiting for final culture- he can be discharged tomorrow.  2. ESRD on hemodialysis -Nephrology consultation for in-house dialysis -Potassium is 3.4  3.  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 Thea Silversmith from [facility] urology to see patient for suprapubic catheter functioning -He recommends to get bladder scan done to see patient has urinary retention  4. Hypotension hold off on Coreg, Imdur, lisinopril   Now BP stable, may resume on d/c if BP is higher.  5. Headaches with sinus pain with history of sinusitis appears chronic -When necessary Norco  6. Anemia of chronic disease  7. DVT prophylaxis Subcutaneous heparin  8. Hypokalemia   Pt is a dialysis pt, monitor potassium.  All the records are reviewed and case discussed with Care Management/Social Workerr. Management plans discussed with the patient, family and they are in agreement.  CODE STATUS: [place].  TOTAL TIME TAKING CARE OF THIS PATIENT: 35 minutes.    POSSIBLE D/C IN 1-2 DAYS, DEPENDING ON CLINICAL CONDITION.   Altamese Dilling M.D on 01/09/2016   Between 7am to 6pm - Pager - [phone]  After 6pm go to [email or web address] - password [email or web address]  [facility]  Office  [phone]  CC: Primary care physician; [name], MD  Note: This dictation was prepared with Dragon dictation along with smaller phrase technology. Any transcriptional errors that result from this process are unintentional.

## 2016-01-09 NOTE — H&P (Signed)
[facility] CONSULT   PATIENT NAME: Taylor [name]    MR#:  [id number]  DATE OF BIRTH:  12/08/1970  DATE OF ADMISSION:  01/07/2016  PRIMARY CARE PHYSICIAN: [name], MD   REQUESTING/REFERRING PHYSICIAN: Dr Darnelle Catalan  CHIEF COMPLAINT:  Lethargic,resp distress  HISTORY OF PRESENT ILLNESS:  resp distress slowly resolving, on [place] today, sleepy this AM unable to provide ROS Lethargic, patient is DNR/DNI, patient remains on vasopressors      REVIEW OF SYSTEMS:  Review of Systems  Unable to perform ROS: critical illness      VITAL SIGNS:  Blood pressure 91/52, pulse 62, temperature 97.4 F (36.3 C), temperature source Oral, resp. rate 21, height 5\' 11"  (1.803 m), weight 189 lb 13.1 oz (86.1 kg), SpO2 100 %.  PHYSICAL EXAMINATION:  GENERAL:  45 y.o.-year-old patient lying in the bed with+res/ acute distress. Appears critically ill on biPAP EYES: Pupils equal, round, reactive to light and accommodation. No scleral icterus. Extraocular muscles intact. Pallor HEENT: Head atraumatic, normocephalic. Oropharynx and nasopharynx clear.  NECK:  Supple, no jugular venous distention. No thyroid enlargement, no tenderness.  LUNGS: Normal breath sounds bilaterally, no wheezing, rales,rhonchi or crepitation. No use of accessory muscles of respiration.  CARDIOVASCULAR: S1, S2 normal. No murmurs, rubs, or gallops.  ABDOMEN: Soft, nontender, distended. Bowel sounds present. No organomegaly or mass.  EXTREMITIES: ++pedal edema,  nocyanosis, or clubbing. No toes  LABORATORY PANEL:   CBC  Recent Labs Lab 01/09/16 0349  WBC 15.6*  HGB 9.4*  HCT 29.3*  PLT 110*   ------------------------------------------------------------------------------------------------------------------  Chemistries   Recent Labs Lab 01/07/16 0633  01/09/16 0349  NA 132*  < > 135  K 3.4*  < > 2.9*  CL 95*  < > 98*  CO2 22  < > 25  GLUCOSE 82  < > 118*  BUN 52*  < > 36*  CREATININE 5.13*  < >  4.03*  CALCIUM 8.0*  < > 7.9*  AST 21  --   --   ALT 10*  --   --   ALKPHOS 900*  --   --   BILITOT 0.8  --   --   < > = values in this interval not displayed. ------------------------------------------------------------------------------------------------------------------  Cardiac Enzymes  Recent Labs Lab 01/07/16 0633  TROPONINI 0.08*   ------------------------------------------------------------------------------------------------------------------  RADIOLOGY:  Dg Abd 1 View  01/08/2016  CLINICAL DATA:  Orogastric tube placement.  Initial encounter. EXAM: ABDOMEN - 1 VIEW COMPARISON:  Abdominal radiograph performed 11/25/2015 FINDINGS: The patient's enteric tube is noted ending overlying the body of the stomach. The stomach is partially filled with solid material. The visualized bowel gas pattern is unremarkable. Scattered air and stool filled loops of colon are seen; no abnormal dilatation of small bowel loops is seen to suggest small bowel obstruction. No free intra-abdominal air is identified, though evaluation for free air is limited on a single supine view. The visualized osseous structures are within normal limits; the sacroiliac joints are unremarkable in appearance. The patient is status post median sternotomy. IMPRESSION: 1. Enteric tube noted ending overlying the body of the stomach. 2. Unremarkable bowel gas pattern; no free intra-abdominal air seen. Small to moderate amount of stool noted in the colon. Electronically Signed   By: 11/27/2015 M.D.   On: 01/08/2016 05:53   Dg Chest Port 1 View  01/08/2016  CLINICAL DATA:  Acute onset of hypoxia.  Initial encounter. EXAM: PORTABLE CHEST 1 VIEW COMPARISON:  Chest radiograph from 01/07/2016 FINDINGS: The  lungs are mildly hypoexpanded. Mild bibasilar opacities may reflect mild pneumonia. There is no evidence of pleural effusion or pneumothorax. The cardiomediastinal silhouette is borderline enlarged. The patient is status post  median sternotomy, with evidence of prior CABG. No acute osseous abnormalities are seen. IMPRESSION: Lungs mildly hypoexpanded. Mild bibasilar opacities may reflect mild pneumonia. Borderline cardiomegaly. Electronically Signed   By: Roanna Raider M.D.   On: 01/08/2016 05:54     IMPRESSION AND PLAN:   45 y.o. 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 Sepsis/septic shock suspected source urine given pyuria is seen in the small amount of urine that's produced in the Foley bag -IV fluids for hydration.  -IV levo fed to keep MAP>65 -Follow blood culture urine culture urinalysis -Pharmacy consult for IV antibiotics  -CODE SEPSIS follow up LA 2.9-->2.4-repeat LA pending  2. ESRD on hemodialysis -Nephrology consultation for in-house dialysis  3. 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 Critical Care Time devoted to patient care services described in this note is 35 minutes.   Overall, patient is critically ill, prognosis is guarded.  Patient with Multiorgan failure and at high risk for cardiac arrest and death.    Lucie Leather, M.D.  [facility]  Medical Director  [facility] Medical Director [facility] Cardio-Pulmonary Department

## 2016-01-09 NOTE — Progress Notes (Signed)
Dr. Tobi Bastos notified about Pt's critical potassium of 2.9. New orders for one time dose of PO potassium given. Will continue to monitor.

## 2016-01-09 NOTE — Progress Notes (Signed)
[facility]  ROUNDING NOTE   Subjective:  Dialysis done last night 2500 cc removed Currently requiring pressors Iv fluids running at 75 cc/hr No c/o difficulty breathing   Objective:  Vital signs in last 24 hours:  Temp:  [97.4 F (36.3 C)-98.5 F (36.9 C)] 97.6 F (36.4 C) (03/02 0817) Pulse Rate:  [43-77] 71 (03/02 0900) Resp:  [11-29] 16 (03/02 0900) BP: (81-111)/(48-67) 87/55 mmHg (03/02 0900) SpO2:  [98 %-100 %] 99 % (03/02 0900) Weight:  [86.1 kg (189 lb 13.1 oz)-88.5 kg (195 lb 1.7 oz)] 86.1 kg (189 lb 13.1 oz) (03/01 2135)  Weight change:  Filed Weights   01/07/16 0534 01/08/16 1800 01/08/16 2135  Weight: 83.008 kg (183 lb) 88.5 kg (195 lb 1.7 oz) 86.1 kg (189 lb 13.1 oz)    Intake/Output: I/O last 3 completed shifts: In: 2844.7 [I.V.:2794.7; IV Piggyback:50] Out: 2528 [Urine:25; Other:2500; Stool:3]   Intake/Output this shift:  Total I/O In: 4.9 [I.V.:4.9] Out: -   Physical Exam: General: NAD, resting in bed  Head:  Moist oral mucosal membranes  Eyes: Anicteric,   Neck: Supple, trachea midline  Lungs:  Clear to auscultation normal effort  Heart: S1S2 no rubs, 2/6 murmur  Abdomen:  Soft, nontender, BS present, distended  Extremities: anasarca  Neurologic: Nonfocal, moving all four extremities  Skin: Chronic venous statis changes  Access: LUE AVF    Basic Metabolic Panel:  Recent Labs Lab 01/07/16 0633 01/08/16 1835 01/08/16 2125 01/09/16 0349  NA 132* 130*  --  135  K 3.4* 3.8  --  2.9*  CL 95* 94*  --  98*  CO2 22 18*  --  25  GLUCOSE 82 194*  --  118*  BUN 52* 66*  --  36*  CREATININE 5.13* 6.11*  --  4.03*  CALCIUM 8.0* 8.0*  --  7.9*  PHOS  --   --  5.6*  --     Liver Function Tests:  Recent Labs Lab 01/07/16 0633  AST 21  ALT 10*  ALKPHOS 900*  BILITOT 0.8  PROT 6.4*  ALBUMIN 2.2*   No results for input(s): LIPASE, AMYLASE in the last 168 hours. No results for input(s): AMMONIA in the last 168  hours.  CBC:  Recent Labs Lab 01/07/16 0633 01/08/16 1835 01/09/16 0349  WBC 16.8* 24.6* 15.6*  HGB 8.6* 8.6* 9.4*  HCT 27.8* 27.3* 29.3*  MCV 89.7 89.5 89.1  PLT 116* 109* 110*    Cardiac Enzymes:  Recent Labs Lab 01/07/16 0633  TROPONINI 0.08*    BNP: Invalid input(s): POCBNP  CBG:  Recent Labs Lab 01/08/16 1631 01/08/16 1943 01/08/16 2352 01/09/16 0450 01/09/16 0756  GLUCAP 178* 153* 133* 109* 138*    Microbiology: Results for orders placed or performed during the hospital encounter of 01/07/16  Blood Culture (routine x 2)     Status: None (Preliminary result)   Collection Time: 01/07/16  6:33 AM  Result Value Ref Range Status   Specimen Description BLOOD RTA  Final   Special Requests   Final    BOTTLES DRAWN AEROBIC AND ANAEROBIC AER ANA   Culture NO GROWTH 1 DAY  Final   Report Status PENDING  Incomplete  Blood Culture (routine x 2)     Status: None (Preliminary result)   Collection Time: 01/07/16  6:58 AM  Result Value Ref Range Status   Specimen Description BLOOD RIGHT ARM  Final   Special Requests   Final  BOTTLES DRAWN AEROBIC AND ANAEROBIC AER ANA   Culture NO GROWTH 1 DAY  Final   Report Status PENDING  Incomplete  Urine culture     Status: None (Preliminary result)   Collection Time: 01/07/16 10:45 AM  Result Value Ref Range Status   Specimen Description URINE, RANDOM  Final   Special Requests NONE  Final   Culture HOLDING FOR POSSIBLE PATHOGEN  Final   Report Status PENDING  Incomplete  MRSA PCR Screening     Status: None   Collection Time: 01/07/16 12:20 PM  Result Value Ref Range Status   MRSA by PCR NEGATIVE NEGATIVE Final    Comment:        The GeneXpert MRSA Assay (FDA approved for NASAL specimens only), is one component of a comprehensive MRSA colonization surveillance program. It is not intended to diagnose MRSA infection nor to guide or monitor treatment for MRSA infections.     Coagulation  Studies: No results for input(s): LABPROT, INR in the last 72 hours.  Urinalysis:  Recent Labs  01/07/16 1045  COLORURINE YELLOW*  LABSPEC 1.016  PHURINE 5.0  GLUCOSEU 50*  HGBUR 2+*  BILIRUBINUR NEGATIVE  KETONESUR NEGATIVE  PROTEINUR 100*  NITRITE NEGATIVE  LEUKOCYTESUR 2+*      Imaging: Dg Abd 1 View  01/08/2016  CLINICAL DATA:  Orogastric tube placement.  Initial encounter. EXAM: ABDOMEN - 1 VIEW COMPARISON:  Abdominal radiograph performed 11/25/2015 FINDINGS: The patient's enteric tube is noted ending overlying the body of the stomach. The stomach is partially filled with solid material. The visualized bowel gas pattern is unremarkable. Scattered air and stool filled loops of colon are seen; no abnormal dilatation of small bowel loops is seen to suggest small bowel obstruction. No free intra-abdominal air is identified, though evaluation for free air is limited on a single supine view. The visualized osseous structures are within normal limits; the sacroiliac joints are unremarkable in appearance. The patient is status post median sternotomy. IMPRESSION: 1. Enteric tube noted ending overlying the body of the stomach. 2. Unremarkable bowel gas pattern; no free intra-abdominal air seen. Small to moderate amount of stool noted in the colon. Electronically Signed   By: Roanna Raider M.D.   On: 01/08/2016 05:53   Dg Chest Port 1 View  01/08/2016  CLINICAL DATA:  Acute onset of hypoxia.  Initial encounter. EXAM: PORTABLE CHEST 1 VIEW COMPARISON:  Chest radiograph from 01/07/2016 FINDINGS: The lungs are mildly hypoexpanded. Mild bibasilar opacities may reflect mild pneumonia. There is no evidence of pleural effusion or pneumothorax. The cardiomediastinal silhouette is borderline enlarged. The patient is status post median sternotomy, with evidence of prior CABG. No acute osseous abnormalities are seen. IMPRESSION: Lungs mildly hypoexpanded. Mild bibasilar opacities may reflect mild pneumonia.  Borderline cardiomegaly. Electronically Signed   By: Roanna Raider M.D.   On: 01/08/2016 05:54     Medications:   . sodium chloride 75 mL/hr at 01/08/16 2100  . norepinephrine (LEVOPHED) Adult infusion Stopped (01/09/16 0818)   . antiseptic oral rinse  7 mL Mouth Rinse q12n4p  . aspirin EC  81 mg Oral Daily  . calcium-vitamin D  1 tablet Oral Daily  . cefTRIAXone (ROCEPHIN)  IV  2 g Intravenous Q24H  . chlorhexidine  15 mL Mouth Rinse BID  . cinacalcet  30 mg Oral Q supper  . citalopram  20 mg Oral Daily  . clopidogrel  75 mg Oral Daily  . docusate sodium  100 mg Oral BID  .  epoetin (EPOGEN/PROCRIT) injection  10,000 Units Intravenous Q M,W,F-HD  . heparin  5,000 Units Subcutaneous 3 times per day  . insulin aspart  1-3 Units Subcutaneous 6 times per day  . insulin glargine  8 Units Subcutaneous QHS  . levothyroxine  75 mcg Oral Q0600  . omega-3 acid ethyl esters  1 g Oral Daily  . pantoprazole  40 mg Oral Daily  . pregabalin  75 mg Oral BID  . rosuvastatin  20 mg Oral QHS  . sevelamer carbonate  800 mg Oral TID WC  . traZODone  50 mg Oral QHS   acetaminophen **OR** acetaminophen, HYDROcodone-acetaminophen, morphine injection, naLOXone (NARCAN)  injection, ondansetron **OR** ondansetron (ZOFRAN) IV  Assessment/ Plan:  45 y.o. 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 TTS  1.  ESRD on HD TTHS:   - next HD Saturday  2.  Septic shock:  Suspect urinary source.   Cultures pending. - mgmt as per IM team  3.  Anemia of CKD:  hgb 9.4, - epogen with HD.  4.  SHPTH:   Monitor phos with HD   5.   Anasarca - 3rd spacing of fluid - fluid removal with dialysis is limited due to hypotension - decrease maintenance IV fluids   LOS: 2 Cortez Flippen 3/2/20179:44 AM

## 2016-01-10 LAB — HEPATITIS B SURFACE ANTIGEN: Hepatitis B Surface Ag: NEGATIVE

## 2016-01-10 LAB — PARATHYROID HORMONE, INTACT (NO CA): PTH: 61 pg/mL (ref 15–65)

## 2016-01-10 LAB — BASIC METABOLIC PANEL
ANION GAP: 13 (ref 5–15)
BUN: 43 mg/dL — ABNORMAL HIGH (ref 6–20)
CALCIUM: 8.3 mg/dL — AB (ref 8.9–10.3)
CHLORIDE: 97 mmol/L — AB (ref 101–111)
CO2: 23 mmol/L (ref 22–32)
Creatinine, Ser: 4.66 mg/dL — ABNORMAL HIGH (ref 0.61–1.24)
GFR calc non Af Amer: 14 mL/min — ABNORMAL LOW (ref >60)
GFR, EST AFRICAN AMERICAN: 16 mL/min — AB (ref >60)
Glucose, Bld: 168 mg/dL — ABNORMAL HIGH (ref 65–99)
Potassium: 3.7 mmol/L (ref 3.5–5.1)
Sodium: 133 mmol/L — ABNORMAL LOW (ref 135–145)

## 2016-01-10 LAB — GLUCOSE, CAPILLARY
GLUCOSE-CAPILLARY: 133 mg/dL — AB (ref 65–99)
GLUCOSE-CAPILLARY: 124 mg/dL — AB (ref 65–99)
GLUCOSE-CAPILLARY: 131 mg/dL — AB (ref 65–99)
GLUCOSE-CAPILLARY: 165 mg/dL — AB (ref 65–99)
Glucose-Capillary: 167 mg/dL — ABNORMAL HIGH (ref 65–99)
Glucose-Capillary: 161 mg/dL — ABNORMAL HIGH (ref 65–99)
Glucose-Capillary: 141 mg/dL — ABNORMAL HIGH (ref 65–99)

## 2016-01-10 LAB — URINE CULTURE: Culture: >=100000

## 2016-01-10 MED ORDER — CEPHALEXIN 250 MG PO CAPS
500.0000 mg | ORAL_CAPSULE | ORAL | Status: DC
  Administered 2016-01-10 – 2016-01-15 (×6): 500 mg via ORAL
  Filled 2016-01-10 (×6): qty 2

## 2016-01-10 MED ORDER — CARBAMIDE PEROXIDE 6.5 % OT SOLN
2.0000 [drp] | Freq: Two times a day (BID) | OTIC | Status: DC
  Administered 2016-01-10 – 2016-01-16 (×12): 2 [drp] via OTIC
  Filled 2016-01-10: qty 15

## 2016-01-10 MED ORDER — NEPRO/CARBSTEADY PO LIQD
237.0000 mL | ORAL | Status: DC
  Administered 2016-01-11 – 2016-01-16 (×5): 237 mL via ORAL

## 2016-01-10 NOTE — Care Management Important Message (Signed)
Important Message  Patient Details  Name: Taylor [name] MRN: [id number] Date of Birth: [birth year]   Medicare Important Message Given:  Yes    [name] 01/10/2016, 10:28 AM

## 2016-01-10 NOTE — Progress Notes (Signed)
[facility] - [place] at [facility] [place]   PATIENT NAME: Taylor [name]    MR#:  [id number]  DATE OF BIRTH:  02-24-1971  SUBJECTIVE:seen at bedside.c/o ear pain.and difficulty hearing,  CHIEF COMPLAINT:   Chief Complaint  Patient presents with  . Headache     Came septic, hypotensive on levophed. Have Ch suprapubic catheter and foley   BP is stable now.  does not have any diarrhea.   Had episode of Hypoxia and fluid overload this morning, now O2 stable.  REVIEW OF SYSTEMS:  CONSTITUTIONAL: No fever, fatigue or weakness.  EYES: No blurred or double vision.  EARS, NOSE, AND THROAT: No tinnitus or ear pain.  RESPIRATORY: No cough, shortness of breath, wheezing or hemoptysis.  CARDIOVASCULAR: No chest pain, orthopnea, edema.  GASTROINTESTINAL: No nausea, vomiting, diarrhea or abdominal pain.  GENITOURINARY: No dysuria, hematuria.  ENDOCRINE: No polyuria, nocturia,  HEMATOLOGY: No anemia, easy bruising or bleeding SKIN: No rash or lesion. MUSCULOSKELETAL: No joint pain or arthritis.   NEUROLOGIC: No tingling, numbness, weakness.  PSYCHIATRY: No anxiety or depression.   ROS  DRUG ALLERGIES:   Allergies  Allergen Reactions  . Amoxicillin-Pot Clavulanate Nausea And Vomiting and Itching  . 2nd Skin Quick Heal Other (See Comments)  . Rifampin Nausea And Vomiting  . Tape Other (See Comments)    VITALS:  Blood pressure 99/48, pulse 71, temperature 97.8 F (36.6 C), temperature source Oral, resp. rate 18, height 5\' 11"  (1.803 m), weight 86.1 kg (189 lb 13.1 oz), SpO2 100 %.  PHYSICAL EXAMINATION:   GENERAL: 45 y.o.-year-old patient lying in the bed with no acute distress. Appears critically ill EYES: Pupils equal, round, reactive to light and accommodation. No scleral icterus. Extraocular muscles intact. Pallor HEENT: Head atraumatic, normocephalic. Oropharynx and nasopharynx clear.  NECK: Supple, no jugular venous distention. No thyroid  enlargement, no tenderness.  LUNGS: Normal breath sounds bilaterally, no wheezing, rales,rhonchi or crepitation. No use of accessory muscles of respiration.  CARDIOVASCULAR: S1, S2 normal. No murmurs, rubs, or gallops.  ABDOMEN: Soft, nontender, distended. Bowel sounds present. No organomegaly or mass. supra pubic catheter present. EXTREMITIES: ++pedal edema, nocyanosis, or clubbing.  NEUROLOGIC: Cranial nerves II through XII are intact. Subjective weakness. Unable to assess muscle strength at present. Sensation intact. Gait not checked.  PSYCHIATRIC: The patient is alert and oriented x 3.  SKIN: No obvious rash, lesion, or ulcer.  Patient has chronic skin changes in the lower extremity with venous stasis changes.   Physical Exam LABORATORY PANEL:   CBC  Recent Labs Lab 01/09/16 0349  WBC 15.6*  HGB 9.4*  HCT 29.3*  PLT 110*   ------------------------------------------------------------------------------------------------------------------  Chemistries   Recent Labs Lab 01/07/16 0633  01/10/16 0529  NA 132*  < > 133*  K 3.4*  < > 3.7  CL 95*  < > 97*  CO2 22  < > 23  GLUCOSE 82  < > 168*  BUN 52*  < > 43*  CREATININE 5.13*  < > 4.66*  CALCIUM 8.0*  < > 8.3*  AST 21  --   --   ALT 10*  --   --   ALKPHOS 900*  --   --   BILITOT 0.8  --   --   < > = values in this interval not displayed. ------------------------------------------------------------------------------------------------------------------  Cardiac Enzymes  Recent Labs Lab 01/07/16 0633  TROPONINI 0.08*   ------------------------------------------------------------------------------------------------------------------  RADIOLOGY:  No results found.  ASSESSMENT AND PLAN:   Active Problems:  Sepsis (HCC)  1. Sepsis/septic shock - source urine given pyuria - UTI -Urinalysis positive -IV fluids for hydration.  Off the presors.bp still ow,continue iv fludis On admission given   vancomycin, levofloxacin and aztreonam  Changed to rocephin for now. - urine cultures showed  ecoli S to  Rocephin  2. ESRD on hemodialysis -Nephrology consultation for in-house dialysis -Potassium is 3.4  3. 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 Thea Silversmith from [facility] urology to see patient for suprapubic catheter functioning -He recommends to get bladder scan done to see patient has urinary retention  4. Hypotension hold off on Coreg, Imdur, lisinopril   bp continues to be low,watch closely ,unable to discharge yet due to hypotension.  5. Headaches with sinus pain with history of sinusitis appears chronic -When necessary Norco  6. Anemia of chronic disease  7. DVT prophylaxis Subcutaneous heparin  8. Hypokalemia   Pt is a dialysis pt, monitor potassium.  All the records are reviewed and case discussed with Care Management/Social Workerr. Management plans discussed with the patient, family and they are in agreement.  CODE STATUS: [place].  TOTAL TIME TAKING CARE OF THIS PATIENT: 20 minutes.    POSSIBLE D/C IN 1-2 DAYS, DEPENDING ON CLINICAL CONDITION.   Katha Hamming M.D on 01/10/2016   Between 7am to 6pm - Pager - [phone]  After 6pm go to [email or web address] - password [email or web address]  [facility]  Office  [phone]  CC: Primary care physician; [name], MD  Note: This dictation was prepared with Dragon dictation along with smaller phrase technology. Any transcriptional errors that result from this process are unintentional.

## 2016-01-10 NOTE — Progress Notes (Signed)
Initial Nutrition Assessment   INTERVENTION:   Meals and Snacks: Cater to patient preferences, Also recommend Renal/Carb Modified diet order Medical Food Supplement Therapy: will recommend Nepro Shake po daily, each supplement provides 425 kcal and 19 grams protein Coordination of Care: will recommend collecting daily weights   NUTRITION DIAGNOSIS:   Inadequate oral intake related to acute illness as evidenced by meal completion < 50%.  GOAL:   Patient will meet greater than or equal to 90% of their needs; ongoing  MONITOR:   PO intake, Supplement acceptance, Diet advancement, Labs, Weight trends, I & O's  REASON FOR ASSESSMENT:   Diagnosis    ASSESSMENT:   Pt admitted with septic shock and AMS with h/o ESRD on HD currently with anasarca; Nephrology following.  Past Medical History  Diagnosis Date  . End stage renal disease on dialysis (HCC)     LUE fistula  . Type I diabetes mellitus (HCC)     a. 03/2014 admitted with HNK to [facility]. b. TTS  . Diabetic neuropathy (HCC)     severe, s/p multiple toe amputation  . Hypothyroidism   . Hypertension   . Hyperlipidemia   . H/O hiatal hernia   . GERD (gastroesophageal reflux disease)   . Anxiety   . Sebaceous cyst     side of neck  . Pneumonia     2010  . Anemia     a. req PRBC's 2011.  [name] PAD (peripheral artery disease) (HCC)     a. s/p amputation of toes on the right;  b. left LE claudication.  . Coronary artery disease     a. s/p MI;  b. 10/2009 CABG x 3 @ [facility]: LIMA->LAD, VG->OM3, VG->RPDA; c. 11/2010 Cath 3/3 patent grafts;  d 12/2012 Cath: LM 30d, LAD 85p, D1 70, D2 90, LCX 40ost, OM2 100, RCA 90p, 18m, L->LAD ok, VG->OM3 ok, VG->RPDA 30, EF 50%->Med Rx.  . Cataract     right  . Valvular disease     a. 11/2012 Echo: EF 55-60%, mild LVH, mild MR, mild bi-atrial enlargement, mild-mod TR, PASP ; b. echo 03/2014: EF 50-55%, nl WM, select images concerning for bicuspid aortic valve w/ nl thicknes of leaflets, mild MR,  mild bi-atrial dilatation, RV mild dilatation - wall thickness nl, mod TR - select images appears to be mod to sev, PASP at least mod elevated.  . Diastolic CHF (HCC)     see echo above  . Depression   . Arthritis     rheumatoid arthritis   . Myocardial infarction (HCC) 2010  . Pulmonary HTN (HCC)     a. continuation of 03/2014 echo PASP @ least mod elevated. RVSP 52 mm Hg. Parasternal long axis estimated @ 85 mm Hg  . Scrotal abscess     a. s/p multiple I&D  . Penile abscess     a. s/p multiple I&D  . Hematemesis     a. EGD 2016 with LA Grade C esophagitis, continued on Protonix   . Chronic respiratory failure (HCC)     a. on 3L oxygen via nasal cannula  . Anxiety   . Acute delirium   . Sepsis (HCC)   . 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 MYOCARDIAL INFARCTION, HX OF 01/26/2011    Qualifier: Diagnosis of  By: [name] MD, [name]    . HTN (hypertension) 02/11/2011  . Pulmonary hypertension associated with end stage renal disease on dialysis (HCC) 04/16/2014  . Hyperkalemia   . Hypothyroidism   .  Osteomyelitis (HCC)     Diet Order:  Diet renal with fluid restriction Fluid restriction:: 1200 mL Fluid; Room service appropriate?: Yes; Fluid consistency:: Thin    Current Nutrition: Pt eating only bites this am at breakfast  Food/Nutrition-Related History: PO intake <50% of meals since admission, with reported decrease in po intake PTA per MST   Scheduled Medications:  . antiseptic oral rinse  7 mL Mouth Rinse q12n4p  . aspirin EC  81 mg Oral Daily  . calcium-vitamin D  1 tablet Oral Daily  . carbamide peroxide  2 drop Left Ear BID  . cephALEXin  500 mg Oral Q24H  . chlorhexidine  15 mL Mouth Rinse BID  . cinacalcet  30 mg Oral Q supper  . citalopram  20 mg Oral Daily  . clopidogrel  75 mg Oral Daily  . docusate sodium  100 mg Oral BID  . epoetin (EPOGEN/PROCRIT) injection  10,000 Units Intravenous Q M,W,F-HD  . heparin  5,000 Units Subcutaneous 3 times per day  . insulin aspart   1-3 Units Subcutaneous 6 times per day  . insulin glargine  8 Units Subcutaneous QHS  . levothyroxine  75 mcg Oral Q0600  . omega-3 acid ethyl esters  1 g Oral Daily  . pantoprazole  40 mg Oral Daily  . pregabalin  75 mg Oral BID  . rosuvastatin  20 mg Oral QHS  . sevelamer carbonate  800 mg Oral TID WC  . traZODone  50 mg Oral QHS    Continuous Medications:  . sodium chloride 30 mL/hr at 01/09/16 1302     Electrolyte/Renal Profile and Glucose Profile:   Recent Labs Lab 01/08/16 1835 01/08/16 2125 01/09/16 0349 01/10/16 0529  NA 130*  --  135 133*  K 3.8  --  2.9* 3.7  CL 94*  --  98* 97*  CO2 18*  --  25 23  BUN 66*  --  36* 43*  CREATININE 6.11*  --  4.03* 4.66*  CALCIUM 8.0*  --  7.9* 8.3*  PHOS  --  5.6*  --   --   GLUCOSE 194*  --  118* 168*   Protein Profile:  Recent Labs Lab 01/07/16 0633  ALBUMIN 2.2*    Gastrointestinal Profile: Last BM: 01/10/2016   Nutrition-Focused Physical Exam Findings:  Unable to complete Nutrition-Focused physical exam at this time.    Weight Change: Pt weight relatively stable per CH[facility] weight encounters  Height:   Ht Readings from Last 1 Encounters:  01/07/16 5\' 11"  (1.803 m)    Weight:   Wt Readings from Last 1 Encounters:  01/08/16 189 lb 13.1 oz (86.1 kg)   Wt Readings from Last 10 Encounters:  01/08/16 189 lb 13.1 oz (86.1 kg)  12/18/15 200 lb (90.719 kg)  11/15/15 195 lb (88.451 kg)  10/17/15 179 lb 14.3 oz (81.6 kg)  08/09/15 185 lb (83.915 kg)  07/08/15 185 lb (83.915 kg)  06/13/15 190 lb (86.183 kg)  06/10/15 185 lb (83.915 kg)  06/10/15 185 lb (83.915 kg)  05/27/15 185 lb (83.915 kg)    BMI:  Body mass index is 26.49 kg/(m^2).  Estimated Nutritional Needs:   Kcal:  BEE: 1677kcals, TEE: (IF 1.1-1.3)(AF 1.2) 2213-1616kcals, using IBW of 78kg  Protein:  94-117g protein (1.2-1.5g/kg)   Fluid:  UOP+1L  EDUCATION NEEDS:   Education needs no appropriate at this time   MODERATE Care  Level  05/29/15, RD, LDN Pager [phone] Weekend/On-Call Pager [phone]

## 2016-01-10 NOTE — Progress Notes (Signed)
[facility]  ROUNDING NOTE   Subjective:  Reports back pain No c/o difficulty breathing Transferred to floor now  Objective:  Vital signs in last 24 hours:  Temp:  [97.8 F (36.6 C)] 97.8 F (36.6 C) (03/03 1300) Pulse Rate:  [71-75] 75 (03/03 1300) Resp:  [18] 18 (03/03 1300) BP: (99)/(48-56) 99/56 mmHg (03/03 1300) SpO2:  [99 %-100 %] 99 % (03/03 1300)  Weight change:  Filed Weights   01/07/16 0534 01/08/16 1800 01/08/16 2135  Weight: 83.008 kg (183 lb) 88.5 kg (195 lb 1.7 oz) 86.1 kg (189 lb 13.1 oz)    Intake/Output: I/O last 3 completed shifts: In: 2103.4 [P.O.:230; I.V.:1623.4; Other:200; IV Piggyback:50] Out: 2678 [Urine:175; Other:2500; Stool:3]   Intake/Output this shift:  Total I/O In: 516 [P.O.:240; I.V.:276] Out: -   Physical Exam: General: NAD, resting in bed  Head:  Moist oral mucosal membranes  Eyes: Anicteric,   Neck: Supple, trachea midline  Lungs:  Clear to auscultation normal effort  Heart: S1S2 no rubs, 2/6 murmur  Abdomen:  Soft, nontender, BS present, distended  Extremities: anasarca  Neurologic: Nonfocal, moving all four extremities  Skin: Chronic venous statis changes  Access: LUE AVF    Basic Metabolic Panel:  Recent Labs Lab 01/07/16 0633 01/08/16 1835 01/08/16 2125 01/09/16 0349 01/10/16 0529  NA 132* 130*  --  135 133*  K 3.4* 3.8  --  2.9* 3.7  CL 95* 94*  --  98* 97*  CO2 22 18*  --  25 23  GLUCOSE 82 194*  --  118* 168*  BUN 52* 66*  --  36* 43*  CREATININE 5.13* 6.11*  --  4.03* 4.66*  CALCIUM 8.0* 8.0*  --  7.9* 8.3*  PHOS  --   --  5.6*  --   --     Liver Function Tests:  Recent Labs Lab 01/07/16 0633  AST 21  ALT 10*  ALKPHOS 900*  BILITOT 0.8  PROT 6.4*  ALBUMIN 2.2*   No results for input(s): LIPASE, AMYLASE in the last 168 hours. No results for input(s): AMMONIA in the last 168 hours.  CBC:  Recent Labs Lab 01/07/16 0633 01/08/16 1835 01/09/16 0349  WBC 16.8* 24.6* 15.6*  HGB  8.6* 8.6* 9.4*  HCT 27.8* 27.3* 29.3*  MCV 89.7 89.5 89.1  PLT 116* 109* 110*    Cardiac Enzymes:  Recent Labs Lab 01/07/16 0633  TROPONINI 0.08*    BNP: Invalid input(s): POCBNP  CBG:  Recent Labs Lab 01/09/16 2349 01/10/16 0410 01/10/16 0749 01/10/16 1006 01/10/16 1201  GLUCAP 242* 167* 133* 124* 131*    Microbiology: Results for orders placed or performed during the hospital encounter of 01/07/16  Blood Culture (routine x 2)     Status: None (Preliminary result)   Collection Time: 01/07/16  6:33 AM  Result Value Ref Range Status   Specimen Description BLOOD RTA  Final   Special Requests   Final    BOTTLES DRAWN AEROBIC AND ANAEROBIC AER ANA   Culture NO GROWTH 1 DAY  Final   Report Status PENDING  Incomplete  Blood Culture (routine x 2)     Status: None (Preliminary result)   Collection Time: 01/07/16  6:58 AM  Result Value Ref Range Status   Specimen Description BLOOD RIGHT ARM  Final   Special Requests   Final    BOTTLES DRAWN AEROBIC AND ANAEROBIC AER ANA   Culture NO GROWTH 1 DAY  Final  Report Status PENDING  Incomplete  Urine culture     Status: None   Collection Time: 01/07/16 10:45 AM  Result Value Ref Range Status   Specimen Description URINE, RANDOM  Final   Special Requests NONE  Final   Culture >=100,000 COLONIES/mL ESCHERICHIA COLI  Final   Report Status 01/10/2016 FINAL  Final   Organism ID, Bacteria ESCHERICHIA COLI  Final      Susceptibility   Escherichia coli - MIC*    AMPICILLIN >=32 RESISTANT Resistant     CEFAZOLIN <=4 SENSITIVE Sensitive     CEFTRIAXONE <=1 SENSITIVE Sensitive     CIPROFLOXACIN >=4 RESISTANT Resistant     GENTAMICIN <=1 SENSITIVE Sensitive     IMIPENEM <=0.25 SENSITIVE Sensitive     NITROFURANTOIN <=16 SENSITIVE Sensitive     TRIMETH/SULFA >=320 RESISTANT Resistant     AMPICILLIN/SULBACTAM >=32 RESISTANT Resistant     PIP/TAZO <=4 SENSITIVE Sensitive     Extended ESBL NEGATIVE Sensitive     *  >=100,000 COLONIES/mL ESCHERICHIA COLI  MRSA PCR Screening     Status: None   Collection Time: 01/07/16 12:20 PM  Result Value Ref Range Status   MRSA by PCR NEGATIVE NEGATIVE Final    Comment:        The GeneXpert MRSA Assay (FDA approved for NASAL specimens only), is one component of a comprehensive MRSA colonization surveillance program. It is not intended to diagnose MRSA infection nor to guide or monitor treatment for MRSA infections.     Coagulation Studies: No results for input(s): LABPROT, INR in the last 72 hours.  Urinalysis: No results for input(s): COLORURINE, LABSPEC, PHURINE, GLUCOSEU, HGBUR, BILIRUBINUR, KETONESUR, PROTEINUR, UROBILINOGEN, NITRITE, LEUKOCYTESUR in the last 72 hours.  Invalid input(s): APPERANCEUR    Imaging: No results found.   Medications:   . sodium chloride 30 mL/hr at 01/09/16 1302   . antiseptic oral rinse  7 mL Mouth Rinse q12n4p  . aspirin EC  81 mg Oral Daily  . calcium-vitamin D  1 tablet Oral Daily  . cephALEXin  500 mg Oral Q24H  . chlorhexidine  15 mL Mouth Rinse BID  . cinacalcet  30 mg Oral Q supper  . citalopram  20 mg Oral Daily  . clopidogrel  75 mg Oral Daily  . docusate sodium  100 mg Oral BID  . epoetin (EPOGEN/PROCRIT) injection  10,000 Units Intravenous Q M,W,F-HD  . heparin  5,000 Units Subcutaneous 3 times per day  . insulin aspart  1-3 Units Subcutaneous 6 times per day  . insulin glargine  8 Units Subcutaneous QHS  . levothyroxine  75 mcg Oral Q0600  . omega-3 acid ethyl esters  1 g Oral Daily  . pantoprazole  40 mg Oral Daily  . pregabalin  75 mg Oral BID  . rosuvastatin  20 mg Oral QHS  . sevelamer carbonate  800 mg Oral TID WC  . traZODone  50 mg Oral QHS   acetaminophen **OR** acetaminophen, HYDROcodone-acetaminophen, morphine injection, naLOXone (NARCAN)  injection, ondansetron **OR** ondansetron (ZOFRAN) IV  Assessment/ Plan:  45 y.o. male  with long standing T1DM, HTN, ESRD, AOCD, SHPTH, LUE  AVF, CAD s/p CABG 12/10, right foot diabetic ulcer, MSSA bacteremia, right scrotal cellulitis 5/12, admission for DKA 5/15, Echo on nov 5th 2015: EF 50-55%, concentric LVH, moderate to severe TR, severe pulmonary hypertension, scrotal/penile abscess with urethrocutaneous fistula s/p SPC placement 2/17, presented with septic shock 01/07/16  [facility] [address] TTS  1.  ESRD on HD  TTHS:   - next HD Saturday  2.  Septic shock/UTI:  Suspect urinary source.   E Coli - mgmt as per IM team  3.  Anemia of CKD:  hgb 9.4, - epogen with HD.  4.  SHPTH:   Monitor phos with HD  5.6  5.  Anasarca - 3rd spacing of fluid - fluid removal with dialysis is limited due to hypotension    LOS: 3 Taylor [name] 3/3/20172:40 PM

## 2016-01-10 NOTE — Progress Notes (Signed)
PULMONARY / CRITICAL CARE MEDICINE   Name: Taylor [name] MRN: [id number] DOB: 1971-06-02    ADMISSION DATE:  01/07/2016  CHIEF COMPLAINT:  Generalized weakness, lethargy and respiratory dsitress   SUBJECTIVE: No acute issues overnight. Sleepy but awakens to voice and touch. C/O back pain; 7/10; pain is chronic and usually improves with re-positioning. Remains off pressors, MAPs 61-65.   VITAL SIGNS: BP 99/48 mmHg  Pulse 71  Temp(Src) 97.8 F (36.6 C) (Oral)  Resp 18  Ht 5\' 11"  (1.803 m)  Wt 189 lb 13.1 oz (86.1 kg)  BMI 26.49 kg/m2  SpO2 100%  HEMODYNAMICS:    VENTILATOR SETTINGS:    INTAKE / OUTPUT: I/O last 3 completed shifts: In: 2103.4 [P.O.:230; I.V.:1623.4; Other:200; IV Piggyback:50] Out: 2678 [Urine:175; Other:2500; Stool:3]  PHYSICAL EXAMINATION: General: Chronically ill looking HEENT: PERRLA, oral cavity moist and pink Neuro:  AAO X 2, moves upper extremities Cardiovascular:  RRR, S1/S2, no MGR Lungs:  CTAB, now wheezes or rhonchi Abdomen: Soft, non-tender, normal bowel sounds  Musculoskeletal:  +ROM upper extremities Skin: warm, suprapubic catheter insertion site with DCD  LABS:  BMET  Recent Labs Lab 01/08/16 1835 01/09/16 0349 01/10/16 0529  NA 130* 135 133*  K 3.8 2.9* 3.7  CL 94* 98* 97*  CO2 18* 25 23  BUN 66* 36* 43*  CREATININE 6.11* 4.03* 4.66*  GLUCOSE 194* 118* 168*    Electrolytes  Recent Labs Lab 01/08/16 1835 01/08/16 2125 01/09/16 0349 01/10/16 0529  CALCIUM 8.0*  --  7.9* 8.3*  PHOS  --  5.6*  --   --     CBC  Recent Labs Lab 01/07/16 0633 01/08/16 1835 01/09/16 0349  WBC 16.8* 24.6* 15.6*  HGB 8.6* 8.6* 9.4*  HCT 27.8* 27.3* 29.3*  PLT 116* 109* 110*    Coag's No results for input(s): APTT, INR in the last 168 hours.  Sepsis Markers  Recent Labs Lab 01/07/16 1313 01/09/16 0847 01/09/16 1113  LATICACIDVEN 2.4* 1.5 1.4    ABG  Recent Labs Lab 01/08/16 0858  PHART 7.41  PCO2ART 32   PO2ART 285*    Liver Enzymes  Recent Labs Lab 01/07/16 0633  AST 21  ALT 10*  ALKPHOS 900*  BILITOT 0.8  ALBUMIN 2.2*    Cardiac Enzymes  Recent Labs Lab 01/07/16 0633  TROPONINI 0.08*    Glucose  Recent Labs Lab 01/09/16 1627 01/09/16 2235 01/09/16 2349 01/10/16 0410 01/10/16 0749 01/10/16 1006  GLUCAP 171* 229* 242* 167* 133* 124*    Imaging No results found.   STUDIES:  10/11/2015: EF 50-55%, grade 2 diastolic function, systolic function reduced, moderately-severely elevated PA pressure  CULTURES: 02/28 Urine>E-colic  ANTIBIOTICS: Ceftriaxone 01/07/2016>  SIGNIFICANT EVENTS: 02/28>Admitted with pyuria and septic shock.   LINES/TUBES: Left upper arm arteriovenous fistula PIV  DISCUSSION: 45 y.o. 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 Mild hyponatremia P:   -HD M-W-F per nephrology -Monitor and correct electrolytes -Foley/foley care  per urology  HEMATOLOGIC A:   Anemia of chronic disease P:  -Monitor H/H -Epogen with dialysis -Heparin for VTE prophylaxis  INFECTIOUS A:   Sepsis/septic shock suspected source urine given pyuria is seen in the small amount of urine that's produced in the Foley bag-WBC trending down P:   -IV fluids   -Antibiotics as above -Repeat cultures if febrile   Gastrointestinal   A GERD P -Continue home dose of PPI ENDOCRINE A:   Hypothyroidism Type I diabetes mellitus   P:   -Monitor blood glucose with SSI coverage -Continue home dose of synthroid  NEUROLOGIC A:   Chronic back pain Debility/generalized weakness Diabetic neuropathy P:   RASS goal: n/a -Morphine and lyrica for pain   FAMILY  - Updates: No family at beside  Patient is back to his baseline. CCM will sign-off. Recall if necessary  Magdalene S. Tukov ANP-BC Pulmonary and Critical Care Medicine [facility] Pager [phone]      STAFF NOTE: I, Dr. Lucie Leather,  have personally reviewed patient's available data, including medical history, events of note, physical examination and test results as part of my evaluation. I have discussed with NP and other care providers such as pharmacist, RN and RRT.  In addition,  I personally evaluated patient and elicited key findings  S:  alert and awake, follows commands, feeling much better since admission  O:  GEN-alert CVS-no murmurs LUNGS-CTA b/l ABD-soft, NT MSK-no toes   Recent Labs CBC Latest Ref Rng 01/12/2016 01/09/2016 01/08/2016  WBC 3.8 - 10.6 K/uL 10.7(H) 15.6(H) 24.6(H)  Hemoglobin 13.0 - 18.0 g/dL 2.5(K) 2.7(C) 6.2(B)  Hematocrit 40.0 - 52.0 % 28.8(L) 29.3(L) 27.3(L)  Platelets 150 - 440 K/uL 79(L) 110(L) 109(L)      Recent Labs BMP Latest Ref Rng 01/10/2016 01/09/2016 01/08/2016  Glucose 65 - 99 mg/dL 762(G) 315(V) 761(Y)  BUN 6 - 20 mg/dL 07(P) 71(G) 62(I)  Creatinine 0.61 - 1.24 mg/dL 9.48(N) 4.62(V) 0.35(K)  Sodium 135 - 145 mmol/L 133(L) 135 130(L)  Potassium 3.5 - 5.1 mmol/L 3.7 2.9(LL) 3.8  Chloride 101 - 111 mmol/L 97(L) 98(L) 94(L)  CO2 22 - 32 mmol/L 23 25 18(L)  Calcium 8.9 - 10.3 mg/dL 8.3(L) 7.9(L) 8.0(L)       (The following images and results were reviewed by Dr. Belia Heman on 01/13/2016). Ct Head Wo Contrast  01/11/2016  CLINICAL DATA:  Admitted for sepsis. History of dizziness and weakness. EXAM:  CT HEAD WITHOUT CONTRAST TECHNIQUE: Contiguous axial images were obtained from the base of the skull through the vertex without intravenous contrast. COMPARISON:  01/07/2016; 06/13/2015 FINDINGS: Mild age advanced atrophy with sulcal prominence. Gray-white differentiation is maintained without CT evidence of acute large territory infarct. No intraparenchymal or extra-axial mass or hemorrhage. Unchanged size and configuration of the ventricles and basilar cisterns. No midline shift. There is persistent near complete opacification of the left frontal sinus and scattered opacification of the left anterior ethmoidal air cells. There is persistent partial opacification of the bilateral mastoid air cells. The remaining paranasal sinuses are normally aerated. No air-fluid levels. Regional soft tissues demonstrate small vessel calcifications but otherwise normal. No displaced calvarial fracture. IMPRESSION: 1. Similar findings of age advanced atrophy without acute intracranial process. 2. Chronic sinus disease as above.  No air-fluid levels. Electronically Signed   By: Simonne Come M.D.   On: 01/11/2016 13:12      A:severe septic shock from infected suprapubic catheter/UTI  P:  sepsis resolving, plan for d/c home soon  The Rest per NP whose note is outlined above and that I agree with  I have personally reviewed/obtained a history, examined the patient, evaluated Pertinent laboratory and RadioGraphic/imaging results, and  formulated the assessment and plan   The Patient requires high complexity decision making for assessment and support, frequent evaluation and titration of therapies, application of advanced monitoring technologies and extensive interpretation of multiple databases.     Lucie Leather, M.D.  [facility]  Medical Director [facility] Medical Director [facility] Cardio-Pulmonary Department

## 2016-01-10 NOTE — Progress Notes (Signed)
Pharmacy Antibiotic Note  Taylor [name] is a 45 y.o. male admitted on 01/07/2016 with septic shock, pyuria.  Pharmacy has been consulted for ceftriaxone dosing.  Plan: Continue ceftriaxone 2 g iv q 24 hours.  Pharmacy to follow up with MD regarding potential transition to oral cephalexin as UCx is growing E.coli sensitive to cefazolin.    Height: 5\' 11"  (180.3 cm) Weight: 189 lb 13.1 oz (86.1 kg) IBW/kg (Calculated) : 75.3  Temp (24hrs), Avg:97.9 F (36.6 C), Min:97.8 F (36.6 C), Max:98 F (36.7 C)   Recent Labs Lab 01/07/16 [phone] 01/07/16 0851 01/07/16 1313 01/08/16 1835 01/09/16 0349 01/09/16 0847 01/09/16 1113 01/10/16 0529  WBC 16.8*  --   --  24.6* 15.6*  --   --   --   CREATININE 5.13*  --   --  6.11* 4.03*  --   --  4.66*  LATICACIDVEN  --  2.9* 2.4*  --   --  1.5 1.4  --     Estimated Creatinine Clearance: 21.1 mL/min (by C-G formula based on Cr of 4.66).    Allergies  Allergen Reactions  . Amoxicillin-Pot Clavulanate Nausea And Vomiting and Itching  . 2nd Skin Quick Heal Other (See Comments)  . Rifampin Nausea And Vomiting  . Tape Other (See Comments)    Antimicrobials this admission: Levaquin/vanc/aztreonam  02/28 >> 02/28 Ceftriaxone 02/28 >>  Dose adjustments this admission:   Microbiology results: 02/28 Blood cx: NGTD x 2 02/28 UCx: E. Coli- S to cefazolin, ceftriaxone 02/28 MRSA PCR negative  Thank you for allowing pharmacy to be a part of this patient's care.  Jirah Rider G 01/10/2016 11:58 AM

## 2016-01-11 ENCOUNTER — Inpatient Hospital Stay: Payer: Medicare Other

## 2016-01-11 LAB — GLUCOSE, CAPILLARY
GLUCOSE-CAPILLARY: 127 mg/dL — AB (ref 65–99)
GLUCOSE-CAPILLARY: 140 mg/dL — AB (ref 65–99)
GLUCOSE-CAPILLARY: 153 mg/dL — AB (ref 65–99)
Glucose-Capillary: 143 mg/dL — ABNORMAL HIGH (ref 65–99)
Glucose-Capillary: 92 mg/dL (ref 65–99)
Glucose-Capillary: 85 mg/dL (ref 65–99)

## 2016-01-11 LAB — C DIFFICILE QUICK SCREEN W PCR REFLEX
C DIFFICILE (CDIFF) INTERP: NEGATIVE
C Diff antigen: NEGATIVE
C Diff toxin: NEGATIVE

## 2016-01-11 MED ORDER — ALBUMIN HUMAN 25 % IV SOLN
25.0000 g | Freq: Once | INTRAVENOUS | Status: AC
  Administered 2016-01-12: 25 g via INTRAVENOUS
  Filled 2016-01-11 (×2): qty 100

## 2016-01-11 MED ORDER — MIDODRINE HCL 5 MG PO TABS
10.0000 mg | ORAL_TABLET | Freq: Every day | ORAL | Status: DC | PRN
  Administered 2016-01-14 – 2016-01-16 (×2): 10 mg via ORAL
  Filled 2016-01-11 (×3): qty 2

## 2016-01-11 NOTE — Progress Notes (Signed)
[facility] - [place] at [facility]   PATIENT NAME: Taylor [name]    MR#:  [id number]  DATE OF BIRTH:  [birth year]  SUBJECTIVE:seen at bedside.c/o blurred vision, Also complains of back pain.  Abdominal pain and able to eat  100% of his food.   CHIEF COMPLAINT:   Chief Complaint  Patient presents with  . Headache     Came septic, hypotensive on levophed. Have Ch suprapubic catheter and foley   BP is stable now.  does not have any diarrhea.     REVIEW OF SYSTEMS:  CONSTITUTIONAL: No fever, fatigue or weakness.  EYES: No blurred or double vision.  EARS, NOSE, AND THROAT: No tinnitus or ear pain.  RESPIRATORY: No cough, shortness of breath, wheezing or hemoptysis.  CARDIOVASCULAR: No chest pain, orthopnea, edema.  GASTROINTESTINAL: No nausea, vomiting, diarrhea or abdominal pain.  GENITOURINARY: No dysuria, hematuria.  ENDOCRINE: No polyuria, nocturia,  HEMATOLOGY: No anemia, easy bruising or bleeding SKIN: No rash or lesion. MUSCULOSKELETAL: No joint pain or arthritis.   NEUROLOGIC: No tingling, numbness, weakness.  PSYCHIATRY: No anxiety or depression.   ROS  DRUG ALLERGIES:   Allergies  Allergen Reactions  . Amoxicillin-Pot Clavulanate Nausea And Vomiting and Itching  . 2nd Skin Quick Heal Other (See Comments)  . Rifampin Nausea And Vomiting  . Tape Other (See Comments)    VITALS:  Blood pressure 108/59, pulse 76, temperature 97.5 F (36.4 C), temperature source Oral, resp. rate 6, height 5\' 11"  (1.803 m), weight 86.1 kg (189 lb 13.1 oz), SpO2 100 %.  PHYSICAL EXAMINATION:   GENERAL: 45 y.o.-year-old patient lying in the bed with no acute distress. Appears critically ill EYES: Pupils equal, round, reactive to light and accommodation. No scleral icterus. Extraocular muscles intact. Pallor HEENT: Head atraumatic, normocephalic. Oropharynx and nasopharynx clear.  NECK: Supple, no jugular venous distention. No thyroid enlargement, no  tenderness.  LUNGS: Normal breath sounds bilaterally, no wheezing, rales,rhonchi or crepitation. No use of accessory muscles of respiration.  CARDIOVASCULAR: S1, S2 normal. No murmurs, rubs, or gallops.  ABDOMEN: Soft, nontender, distended. Bowel sounds present. No organomegaly or mass. supra pubic catheter present. EXTREMITIES: ++pedal edema, nocyanosis, or clubbing.  NEUROLOGIC: Cranial nerves II through XII are intact. Subjective weakness. Unable to assess muscle strength at present. Sensation intact. Gait not checked.  PSYCHIATRIC: The patient is alert and oriented x 3.  SKIN: No obvious rash, lesion, or ulcer.  Patient has chronic skin changes in the lower extremity with venous stasis changes.   Physical Exam LABORATORY PANEL:   CBC  Recent Labs Lab 01/09/16 0349  WBC 15.6*  HGB 9.4*  HCT 29.3*  PLT 110*   ------------------------------------------------------------------------------------------------------------------  Chemistries   Recent Labs Lab 01/07/16 0633  01/10/16 0529  NA 132*  < > 133*  K 3.4*  < > 3.7  CL 95*  < > 97*  CO2 22  < > 23  GLUCOSE 82  < > 168*  BUN 52*  < > 43*  CREATININE 5.13*  < > 4.66*  CALCIUM 8.0*  < > 8.3*  AST 21  --   --   ALT 10*  --   --   ALKPHOS 900*  --   --   BILITOT 0.8  --   --   < > = values in this interval not displayed. ------------------------------------------------------------------------------------------------------------------  Cardiac Enzymes  Recent Labs Lab 01/07/16 0633  TROPONINI 0.08*   ------------------------------------------------------------------------------------------------------------------  RADIOLOGY:  No results found.  ASSESSMENT AND  PLAN:   Active Problems:   Sepsis (HCC)  1. Sepsis/septic shock - source urine given pyuria - UTI -Urinalysis positive   Off the presors.BP  stable so we will discontinue IV hydration today. On admission given  vancomycin, levofloxacin  and aztreonam On by mouth Keflex for UTI. - urine cultures showed  ecoli S to  Rocephin  2. ESRD on hemodialysis -Nephrology consultation for in-house dialysis -Potassium is 3.4  3. 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 Thea Silversmith from [facility] urology to see patient for suprapubic catheter functioning -He recommends to get bladder scan done to see patient has urinary retention  4. Hypotension hold off on Coreg, Imdur, lisinopril   bp kind of soft unable to restart BP medicines. 5. Headaches with sinus pain with history of sinusitis appears chronic -When necessary Norco #6 complains of blurred vision: May have diabetic retinopathy diabetic retinopathy.Needs a frequent checkups with the ophthalmologist if the head CT is negative today. Check a CT of the head, physical therapy consult.  6. Anemia of chronic disease  7. DVT prophylaxis Subcutaneous heparin  8. Hypokalemia   Pt is a dialysis pt, monitor potassium.  All the records are reviewed and case discussed with Care Management/Social Workerr. Management plans discussed with the patient, family and they are in agreement.  CODE STATUS: Ful[place].  TOTAL TIME TAKING CARE OF THIS PATIENT: 20 minutes.    POSSIBLE D/C IN 1-2 DAYS, DEPENDING ON CLINICAL CONDITION.   Katha Hamming M.D on 01/11/2016   Between 7am to 6pm - Pager - [phone]  After 6pm go to [email or web address] - password [email or web address]  [facility]  Office  [phone]  CC: Primary care physician; [name], MD  Note: This dictation was prepared with Dragon dictation along with smaller phrase technology. Any transcriptional errors that result from this process are unintentional.

## 2016-01-11 NOTE — Progress Notes (Signed)
[facility]  ROUNDING NOTE   Subjective:  Reports back pain No c/o difficulty breathing Continues to have large amount of generalized edema  Objective:  Vital signs in last 24 hours:  Temp:  [96.8 F (36 C)-98.5 F (36.9 C)] 96.8 F (36 C) (03/04 1217) Pulse Rate:  [72-78] 72 (03/04 1217) Resp:  [6-18] 16 (03/04 1217) BP: (99-108)/(48-59) 99/53 mmHg (03/04 1217) SpO2:  [99 %-100 %] 100 % (03/04 1217)  Weight change:  Filed Weights   01/07/16 0534 01/08/16 1800 01/08/16 2135  Weight: 83.008 kg (183 lb) 88.5 kg (195 lb 1.7 oz) 86.1 kg (189 lb 13.1 oz)    Intake/Output: I/O last 3 completed shifts: In: 1622.7 [P.O.:540; I.V.:982.7; Other:100] Out: 175 [Urine:175]   Intake/Output this shift:     Physical Exam: General: NAD, resting in bed  Head:  Moist oral mucosal membranes  Eyes: Anicteric,   Neck: Supple, trachea midline  Lungs:  Clear to auscultation normal effort  Heart: S1S2 no rubs, 2/6 murmur  Abdomen:  Soft, nontender, BS present, distended  Extremities: anasarca  Neurologic: Nonfocal, moving all four extremities  Skin: Chronic venous statis changes  Access: LUE AVF    Basic Metabolic Panel:  Recent Labs Lab 01/07/16 0633 01/08/16 1835 01/08/16 2125 01/09/16 0349 01/10/16 0529  NA 132* 130*  --  135 133*  K 3.4* 3.8  --  2.9* 3.7  CL 95* 94*  --  98* 97*  CO2 22 18*  --  25 23  GLUCOSE 82 194*  --  118* 168*  BUN 52* 66*  --  36* 43*  CREATININE 5.13* 6.11*  --  4.03* 4.66*  CALCIUM 8.0* 8.0*  --  7.9* 8.3*  PHOS  --   --  5.6*  --   --     Liver Function Tests:  Recent Labs Lab 01/07/16 0633  AST 21  ALT 10*  ALKPHOS 900*  BILITOT 0.8  PROT 6.4*  ALBUMIN 2.2*   No results for input(s): LIPASE, AMYLASE in the last 168 hours. No results for input(s): AMMONIA in the last 168 hours.  CBC:  Recent Labs Lab 01/07/16 0633 01/08/16 1835 01/09/16 0349  WBC 16.8* 24.6* 15.6*  HGB 8.6* 8.6* 9.4*  HCT 27.8* 27.3* 29.3*   MCV 89.7 89.5 89.1  PLT 116* 109* 110*    Cardiac Enzymes:  Recent Labs Lab 01/07/16 0633  TROPONINI 0.08*    BNP: Invalid input(s): POCBNP  CBG:  Recent Labs Lab 01/11/16 0008 01/11/16 0355 01/11/16 0509 01/11/16 0728 01/11/16 1037  GLUCAP 153* 143* 127* 92 85    Microbiology: Results for orders placed or performed during the hospital encounter of 01/07/16  Blood Culture (routine x 2)     Status: None (Preliminary result)   Collection Time: 01/07/16  6:33 AM  Result Value Ref Range Status   Specimen Description BLOOD RTA  Final   Special Requests   Final    BOTTLES DRAWN AEROBIC AND ANAEROBIC AER ANA   Culture NO GROWTH 4 DAYS  Final   Report Status PENDING  Incomplete  Blood Culture (routine x 2)     Status: None (Preliminary result)   Collection Time: 01/07/16  6:58 AM  Result Value Ref Range Status   Specimen Description BLOOD RIGHT ARM  Final   Special Requests   Final    BOTTLES DRAWN AEROBIC AND ANAEROBIC AER ANA   Culture NO GROWTH 4 DAYS  Final   Report Status  PENDING  Incomplete  Urine culture     Status: None   Collection Time: 01/07/16 10:45 AM  Result Value Ref Range Status   Specimen Description URINE, RANDOM  Final   Special Requests NONE  Final   Culture >=100,000 COLONIES/mL ESCHERICHIA COLI  Final   Report Status 01/10/2016 FINAL  Final   Organism ID, Bacteria ESCHERICHIA COLI  Final      Susceptibility   Escherichia coli - MIC*    AMPICILLIN >=32 RESISTANT Resistant     CEFAZOLIN <=4 SENSITIVE Sensitive     CEFTRIAXONE <=1 SENSITIVE Sensitive     CIPROFLOXACIN >=4 RESISTANT Resistant     GENTAMICIN <=1 SENSITIVE Sensitive     IMIPENEM <=0.25 SENSITIVE Sensitive     NITROFURANTOIN <=16 SENSITIVE Sensitive     TRIMETH/SULFA >=320 RESISTANT Resistant     AMPICILLIN/SULBACTAM >=32 RESISTANT Resistant     PIP/TAZO <=4 SENSITIVE Sensitive     Extended ESBL NEGATIVE Sensitive     * >=100,000 COLONIES/mL ESCHERICHIA COLI   MRSA PCR Screening     Status: None   Collection Time: 01/07/16 12:20 PM  Result Value Ref Range Status   MRSA by PCR NEGATIVE NEGATIVE Final    Comment:        The GeneXpert MRSA Assay (FDA approved for NASAL specimens only), is one component of a comprehensive MRSA colonization surveillance program. It is not intended to diagnose MRSA infection nor to guide or monitor treatment for MRSA infections.     Coagulation Studies: No results for input(s): LABPROT, INR in the last 72 hours.  Urinalysis: No results for input(s): COLORURINE, LABSPEC, PHURINE, GLUCOSEU, HGBUR, BILIRUBINUR, KETONESUR, PROTEINUR, UROBILINOGEN, NITRITE, LEUKOCYTESUR in the last 72 hours.  Invalid input(s): APPERANCEUR    Imaging: No results found.   Medications:   . sodium chloride 30 mL/hr at 01/11/16 0015   . albumin human  25 g Intravenous Once  . antiseptic oral rinse  7 mL Mouth Rinse q12n4p  . aspirin EC  81 mg Oral Daily  . calcium-vitamin D  1 tablet Oral Daily  . carbamide peroxide  2 drop Left Ear BID  . cephALEXin  500 mg Oral Q24H  . chlorhexidine  15 mL Mouth Rinse BID  . cinacalcet  30 mg Oral Q supper  . citalopram  20 mg Oral Daily  . clopidogrel  75 mg Oral Daily  . docusate sodium  100 mg Oral BID  . epoetin (EPOGEN/PROCRIT) injection  10,000 Units Intravenous Q M,W,F-HD  . feeding supplement (NEPRO CARB STEADY)  237 mL Oral Q24H  . heparin  5,000 Units Subcutaneous 3 times per day  . insulin aspart  1-3 Units Subcutaneous 6 times per day  . insulin glargine  8 Units Subcutaneous QHS  . levothyroxine  75 mcg Oral Q0600  . omega-3 acid ethyl esters  1 g Oral Daily  . pantoprazole  40 mg Oral Daily  . pregabalin  75 mg Oral BID  . rosuvastatin  20 mg Oral QHS  . sevelamer carbonate  800 mg Oral TID WC  . traZODone  50 mg Oral QHS   acetaminophen **OR** acetaminophen, HYDROcodone-acetaminophen, midodrine, morphine injection, naLOXone (NARCAN)  injection, ondansetron  **OR** ondansetron (ZOFRAN) IV  Assessment/ Plan:  45 y.o. 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  1.  ESRD on HD TTHS:   - hemodialysis today as per routine schedule  2.  Septic shock/UTI:  Suspect urinary source.   E Coli - mgmt as per IM team  3.  Anemia of CKD:  hgb 9.4, - epogen with HD.  4.  SHPTH:   Monitor phos with HD  5.6  5.  Anasarca - 3rd spacing of fluid - fluid removal with dialysis is limited due to hypotension - trial of removing 2-3 L today with IV albumin  6. Consider palliative care consult on Monday     LOS: 4 Taylor [name] 3/4/201712:38 PM

## 2016-01-12 LAB — CBC
HCT: 28.8 % — ABNORMAL LOW (ref 40.0–52.0)
Hemoglobin: 9.1 g/dL — ABNORMAL LOW (ref 13.0–18.0)
MCH: 28.2 pg (ref 26.0–34.0)
MCHC: 31.6 g/dL — ABNORMAL LOW (ref 32.0–36.0)
MCV: 89.3 fL (ref 80.0–100.0)
PLATELETS: 79 10*3/uL — AB (ref 150–440)
RBC: 3.22 MIL/uL — ABNORMAL LOW (ref 4.40–5.90)
RDW: 16.6 % — AB (ref 11.5–14.5)
WBC: 10.7 10*3/uL — AB (ref 3.8–10.6)

## 2016-01-12 LAB — CULTURE, BLOOD (ROUTINE X 2)
CULTURE: NO GROWTH
Culture: NO GROWTH

## 2016-01-12 LAB — GLUCOSE, CAPILLARY
Glucose-Capillary: 106 mg/dL — ABNORMAL HIGH (ref 65–99)
Glucose-Capillary: 114 mg/dL — ABNORMAL HIGH (ref 65–99)
Glucose-Capillary: 115 mg/dL — ABNORMAL HIGH (ref 65–99)
Glucose-Capillary: 138 mg/dL — ABNORMAL HIGH (ref 65–99)
Glucose-Capillary: 154 mg/dL — ABNORMAL HIGH (ref 65–99)
Glucose-Capillary: 99 mg/dL (ref 65–99)

## 2016-01-12 NOTE — Progress Notes (Signed)
[facility]  ROUNDING NOTE   Subjective:  Patient seen during dialysis Tolerating well    HEMODIALYSIS FLOWSHEET:  Blood Flow Rate (mL/min): 400 mL/min Arterial Pressure (mmHg): -200 mmHg Venous Pressure (mmHg): 190 mmHg Transmembrane Pressure (mmHg): 50 mmHg Ultrafiltration Rate (mL/min): 990 mL/min Dialysate Flow Rate (mL/min): 800 ml/min Conductivity: Machine : 14 Conductivity: Machine : 14 Dialysis Fluid Bolus: Normal Saline Bolus Amount (mL): 250 mL Intra-Hemodialysis Comments: HD COMPLETED (TOTAL UF )    Objective:  Vital signs in last 24 hours:  Temp:  [96.1 F (35.6 C)-97.7 F (36.5 C)] 96.1 F (35.6 C) (03/05 1500) Pulse Rate:  [67-72] 67 (03/05 0622) Resp:  [14-18] 16 (03/05 1400) BP: (94-104)/(45-54) 94/45 mmHg (03/05 0622) SpO2:  [98 %-100 %] 98 % (03/05 0622) Weight:  [87.8 kg (193 lb 9 oz)-90.3 kg (199 lb 1.2 oz)] 87.8 kg (193 lb 9 oz) (03/05 1500)  Weight change:  Filed Weights   01/08/16 2135 01/12/16 1125 01/12/16 1500  Weight: 86.1 kg (189 lb 13.1 oz) 90.3 kg (199 lb 1.2 oz) 87.8 kg (193 lb 9 oz)    Intake/Output: I/O last 3 completed shifts: In: 1291 [I.V.:1291] Out: 0    Intake/Output this shift:  Total I/O In: 74 [I.V.:74] Out: 0   Physical Exam: General: NAD, resting in bed  Head:  Moist oral mucosal membranes  Eyes: Anicteric,   Neck: Supple, trachea midline  Lungs:  Clear to auscultation normal effort  Heart: S1S2 no rubs, 2/6 murmur  Abdomen:  Soft, nontender, BS present, distended  Extremities: anasarca  Neurologic: Follows commands  Skin: Chronic venous statis changes  Access: LUE AVF    Basic Metabolic Panel:  Recent Labs Lab 01/07/16 0633 01/08/16 1835 01/08/16 2125 01/09/16 0349 01/10/16 0529  NA 132* 130*  --  135 133*  K 3.4* 3.8  --  2.9* 3.7  CL 95* 94*  --  98* 97*  CO2 22 18*  --  25 23  GLUCOSE 82 194*  --  118* 168*  BUN 52* 66*  --  36* 43*  CREATININE 5.13* 6.11*  --  4.03*  4.66*  CALCIUM 8.0* 8.0*  --  7.9* 8.3*  PHOS  --   --  5.6*  --   --     Liver Function Tests:  Recent Labs Lab 01/07/16 0633  AST 21  ALT 10*  ALKPHOS 900*  BILITOT 0.8  PROT 6.4*  ALBUMIN 2.2*   No results for input(s): LIPASE, AMYLASE in the last 168 hours. No results for input(s): AMMONIA in the last 168 hours.  CBC:  Recent Labs Lab 01/07/16 0633 01/08/16 1835 01/09/16 0349 01/12/16 0740  WBC 16.8* 24.6* 15.6* 10.7*  HGB 8.6* 8.6* 9.4* 9.1*  HCT 27.8* 27.3* 29.3* 28.8*  MCV 89.7 89.5 89.1 89.3  PLT 116* 109* 110* 79*    Cardiac Enzymes:  Recent Labs Lab 01/07/16 0633  TROPONINI 0.08*    BNP: Invalid input(s): POCBNP  CBG:  Recent Labs Lab 01/11/16 1646 01/11/16 2141 01/12/16 0001 01/12/16 0503 01/12/16 0731  GLUCAP 140* 138* 154* 115* 106*    Microbiology: Results for orders placed or performed during the hospital encounter of 01/07/16  Blood Culture (routine x 2)     Status: None   Collection Time: 01/07/16  6:33 AM  Result Value Ref Range Status   Specimen Description BLOOD RTA  Final   Special Requests   Final    BOTTLES DRAWN AEROBIC AND ANAEROBIC AER ANA  Culture NO GROWTH 5 DAYS  Final   Report Status 01/12/2016 FINAL  Final  Blood Culture (routine x 2)     Status: None   Collection Time: 01/07/16  6:58 AM  Result Value Ref Range Status   Specimen Description BLOOD RIGHT ARM  Final   Special Requests   Final    BOTTLES DRAWN AEROBIC AND ANAEROBIC AER ANA   Culture NO GROWTH 5 DAYS  Final   Report Status 01/12/2016 FINAL  Final  Urine culture     Status: None   Collection Time: 01/07/16 10:45 AM  Result Value Ref Range Status   Specimen Description URINE, RANDOM  Final   Special Requests NONE  Final   Culture >=100,000 COLONIES/mL ESCHERICHIA COLI  Final   Report Status 01/10/2016 FINAL  Final   Organism ID, Bacteria ESCHERICHIA COLI  Final      Susceptibility   Escherichia coli - MIC*    AMPICILLIN >=32  RESISTANT Resistant     CEFAZOLIN <=4 SENSITIVE Sensitive     CEFTRIAXONE <=1 SENSITIVE Sensitive     CIPROFLOXACIN >=4 RESISTANT Resistant     GENTAMICIN <=1 SENSITIVE Sensitive     IMIPENEM <=0.25 SENSITIVE Sensitive     NITROFURANTOIN <=16 SENSITIVE Sensitive     TRIMETH/SULFA >=320 RESISTANT Resistant     AMPICILLIN/SULBACTAM >=32 RESISTANT Resistant     PIP/TAZO <=4 SENSITIVE Sensitive     Extended ESBL NEGATIVE Sensitive     * >=100,000 COLONIES/mL ESCHERICHIA COLI  MRSA PCR Screening     Status: None   Collection Time: 01/07/16 12:20 PM  Result Value Ref Range Status   MRSA by PCR NEGATIVE NEGATIVE Final    Comment:        The GeneXpert MRSA Assay (FDA approved for NASAL specimens only), is one component of a comprehensive MRSA colonization surveillance program. It is not intended to diagnose MRSA infection nor to guide or monitor treatment for MRSA infections.   C difficile quick scan w PCR reflex     Status: None   Collection Time: 01/11/16 10:57 AM  Result Value Ref Range Status   C Diff antigen NEGATIVE NEGATIVE Final   C Diff toxin NEGATIVE NEGATIVE Final   C Diff interpretation Negative for C. difficile  Final    Coagulation Studies: No results for input(s): LABPROT, INR in the last 72 hours.  Urinalysis: No results for input(s): COLORURINE, LABSPEC, PHURINE, GLUCOSEU, HGBUR, BILIRUBINUR, KETONESUR, PROTEINUR, UROBILINOGEN, NITRITE, LEUKOCYTESUR in the last 72 hours.  Invalid input(s): APPERANCEUR    Imaging: Ct Head Wo Contrast  01/11/2016  CLINICAL DATA:  Admitted for sepsis. History of dizziness and weakness. EXAM: CT HEAD WITHOUT CONTRAST TECHNIQUE: Contiguous axial images were obtained from the base of the skull through the vertex without intravenous contrast. COMPARISON:  01/07/2016; 06/13/2015 FINDINGS: Mild age advanced atrophy with sulcal prominence. Gray-white differentiation is maintained without CT evidence of acute large territory infarct. No  intraparenchymal or extra-axial mass or hemorrhage. Unchanged size and configuration of the ventricles and basilar cisterns. No midline shift. There is persistent near complete opacification of the left frontal sinus and scattered opacification of the left anterior ethmoidal air cells. There is persistent partial opacification of the bilateral mastoid air cells. The remaining paranasal sinuses are normally aerated. No air-fluid levels. Regional soft tissues demonstrate small vessel calcifications but otherwise normal. No displaced calvarial fracture. IMPRESSION: 1. Similar findings of age advanced atrophy without acute intracranial process. 2. Chronic sinus disease as above.  No  air-fluid levels. Electronically Signed   By: Simonne Come M.D.   On: 01/11/2016 13:12     Medications:   . sodium chloride 30 mL/hr at 01/11/16 2030   . antiseptic oral rinse  7 mL Mouth Rinse q12n4p  . aspirin EC  81 mg Oral Daily  . calcium-vitamin D  1 tablet Oral Daily  . carbamide peroxide  2 drop Left Ear BID  . cephALEXin  500 mg Oral Q24H  . chlorhexidine  15 mL Mouth Rinse BID  . cinacalcet  30 mg Oral Q supper  . citalopram  20 mg Oral Daily  . clopidogrel  75 mg Oral Daily  . docusate sodium  100 mg Oral BID  . epoetin (EPOGEN/PROCRIT) injection  10,000 Units Intravenous Q M,W,F-HD  . feeding supplement (NEPRO CARB STEADY)  237 mL Oral Q24H  . heparin  5,000 Units Subcutaneous 3 times per day  . insulin aspart  1-3 Units Subcutaneous 6 times per day  . insulin glargine  8 Units Subcutaneous QHS  . levothyroxine  75 mcg Oral Q0600  . omega-3 acid ethyl esters  1 g Oral Daily  . pantoprazole  40 mg Oral Daily  . pregabalin  75 mg Oral BID  . rosuvastatin  20 mg Oral QHS  . sevelamer carbonate  800 mg Oral TID WC  . traZODone  50 mg Oral QHS   acetaminophen **OR** acetaminophen, HYDROcodone-acetaminophen, midodrine, morphine injection, naLOXone (NARCAN)  injection, ondansetron **OR** ondansetron  (ZOFRAN) IV  Assessment/ Plan:  45 y.o. 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 TTS  1.  ESRD on HD TTHS:   - Patient seen during dialysis Tolerating well   2.  Septic shock/UTI:  Suspect urinary source.   E Coli - mgmt as per IM team  3.  Anemia of CKD:  hgb 9.1, - epogen with HD.  4.  SHPTH:   Monitor phos with HD  5.6  5.  Anasarca - 3rd spacing of fluid - fluid removal with dialysis is limited due to hypotension    LOS: 5 Sreenidhi Ganson 3/5/20173:16 PM

## 2016-01-12 NOTE — Progress Notes (Signed)
PT Cancellation Note  Patient Details Name: ZAEVION PARKE MRN: [id number] DOB: 1971-01-13   Cancelled Treatment:    Reason Eval/Treat Not Completed: Patient at procedure or test/unavailable Pt is out of room for dialysis, will try back at a later time as appropriate.  Loran Senters, PT, DPT [phone]  Malachi Pro 01/12/2016, 11:42 AM

## 2016-01-12 NOTE — Progress Notes (Signed)
[facility] - [place] at [facility]   PATIENT NAME: Taylor [name]    MR#:  [id number]  DATE OF BIRTH:  [birth year]  SUBJECTIVE: Seen at bedside he says he doesn't feel well but unable to tell what's going on.  C/o Generalized weakness. C/o back pain.  CHIEF COMPLAINT:   Chief Complaint  Patient presents with  . Headache     Came septic, hypotensive on levophed. Have Ch suprapubic catheter and foley   BP is stable now.  does not have any diarrhea.     REVIEW OF SYSTEMS:  CONSTITUTIONAL: No fever, fatigue or weakness.  EYES: No blurred or double vision.  EARS, NOSE, AND THROAT: No tinnitus or ear pain.  RESPIRATORY: No cough, shortness of breath, wheezing or hemoptysis.  CARDIOVASCULAR: No chest pain, orthopnea, edema.  GASTROINTESTINAL: No nausea, vomiting, diarrhea or abdominal pain.  GENITOURINARY: No dysuria, hematuria.  ENDOCRINE: No polyuria, nocturia,  HEMATOLOGY: No anemia, easy bruising or bleeding SKIN: No rash or lesion. MUSCULOSKELETAL:c/o back pain  NEUROLOGIC: No tingling, numbness, weakness.  PSYCHIATRY; has  anxiety, depression.   ROS  DRUG ALLERGIES:   Allergies  Allergen Reactions  . Amoxicillin-Pot Clavulanate Nausea And Vomiting and Itching  . 2nd Skin Quick Heal Other (See Comments)  . Rifampin Nausea And Vomiting  . Tape Other (See Comments)    VITALS:  Blood pressure 94/45, pulse 67, temperature 97.7 F (36.5 C), temperature source Oral, resp. rate 14, height 5\' 11"  (1.803 m), weight 86.1 kg (189 lb 13.1 oz), SpO2 98 %.  PHYSICAL EXAMINATION:   GENERAL: 45 y.o.-year-old patient lying in the bed with no acute distress. Appears critically ill EYES: Pupils equal, round, reactive to light and accommodation. No scleral icterus. Extraocular muscles intact. Pallor HEENT: Head atraumatic, normocephalic. Oropharynx and nasopharynx clear.  NECK: Supple, no jugular venous distention. No thyroid enlargement, no tenderness.   LUNGS: Normal breath sounds bilaterally, no wheezing, rales,rhonchi or crepitation. No use of accessory muscles of respiration.  CARDIOVASCULAR: S1, S2 normal. No murmurs, rubs, or gallops.  ABDOMEN: Soft, nontender, distended. Bowel sounds present. No organomegaly or mass. supra pubic catheter present. EXTREMITIES: ++pedal edema, nocyanosis, or clubbing.  NEUROLOGIC: Cranial nerves II through XII are intact. Subjective weakness. Unable to assess muscle strength at present. Sensation intact. Gait not checked.  PSYCHIATRIC: The patient is alert and oriented x 3.  SKIN: No obvious rash, lesion, or ulcer.  Patient has chronic skin changes in the lower extremity with venous stasis changes.   Physical Exam LABORATORY PANEL:   CBC  Recent Labs Lab 01/12/16 0740  WBC 10.7*  HGB 9.1*  HCT 28.8*  PLT 79*   ------------------------------------------------------------------------------------------------------------------  Chemistries   Recent Labs Lab 01/07/16 0633  01/10/16 0529  NA 132*  < > 133*  K 3.4*  < > 3.7  CL 95*  < > 97*  CO2 22  < > 23  GLUCOSE 82  < > 168*  BUN 52*  < > 43*  CREATININE 5.13*  < > 4.66*  CALCIUM 8.0*  < > 8.3*  AST 21  --   --   ALT 10*  --   --   ALKPHOS 900*  --   --   BILITOT 0.8  --   --   < > = values in this interval not displayed. ------------------------------------------------------------------------------------------------------------------  Cardiac Enzymes  Recent Labs Lab 01/07/16 0633  TROPONINI 0.08*   ------------------------------------------------------------------------------------------------------------------  RADIOLOGY:  Ct Head Wo Contrast  01/11/2016  CLINICAL DATA:  Admitted for sepsis. History of dizziness and weakness. EXAM: CT HEAD WITHOUT CONTRAST TECHNIQUE: Contiguous axial images were obtained from the base of the skull through the vertex without intravenous contrast. COMPARISON:  01/07/2016; 06/13/2015  FINDINGS: Mild age advanced atrophy with sulcal prominence. Gray-white differentiation is maintained without CT evidence of acute large territory infarct. No intraparenchymal or extra-axial mass or hemorrhage. Unchanged size and configuration of the ventricles and basilar cisterns. No midline shift. There is persistent near complete opacification of the left frontal sinus and scattered opacification of the left anterior ethmoidal air cells. There is persistent partial opacification of the bilateral mastoid air cells. The remaining paranasal sinuses are normally aerated. No air-fluid levels. Regional soft tissues demonstrate small vessel calcifications but otherwise normal. No displaced calvarial fracture. IMPRESSION: 1. Similar findings of age advanced atrophy without acute intracranial process. 2. Chronic sinus disease as above.  No air-fluid levels. Electronically Signed   By: Simonne Come M.D.   On: 01/11/2016 13:12    ASSESSMENT AND PLAN:   Active Problems:   Sepsis (HCC)  1. Sepsis/septic shock - source urine given pyuria - UTI -Urinalysis positive   Off the presors.BP  stable so we will discontinue IV hydration today. On admission given  vancomycin, levofloxacin and aztreonam On by mouth Keflex for Escherichia coli UTI.. -  2. ESRD on hemodialysis -Nephrology consultation for in-house dialysis -Fluid removal is admitted because of hypotension. patient received dialysis yesterday with IV albumin.  3. 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 Thea Silversmith from [facility] urology to see patient for suprapubic catheter functioning -He recommends to get bladder scan done to see patient has urinary retention  4. Hypotension hold off on Coreg, Imdur, lisinopril   bp kind of soft unable to restart BP medicines. 5. Headaches with sinus pain with history of sinusitis appears chronic -When necessary Norco #6 complains of blurred vision: CT head is  negative for acute infarct. Underlying diabetic retinopathy and needs eye doctor appointment as an outpatient  6. Anemia of chronic disease  7. DVT prophylaxis Subcutaneous heparin  8. Hypokalemia   Pt is a dialysis pt, monitor potassium.  All the records are reviewed and case discussed with Care Management/Social Workerr. Management plans discussed with the patient, family and they are in agreement.  CODE STATUS: [place].  TOTAL TIME TAKING CARE OF THIS PATIENT: 20 minutes.    POSSIBLE D/C IN 1-2 DAYS, DEPENDING ON CLINICAL CONDITION.   Katha Hamming M.D on 01/12/2016   Between 7am to 6pm - Pager - [phone]  After 6pm go to [email or web address] - password [email or web address]  [facility]  Office  [phone]  CC: Primary care physician; [name], MD  Note: This dictation was prepared with Dragon dictation along with smaller phrase technology. Any transcriptional errors that result from this process are unintentional.

## 2016-01-13 ENCOUNTER — Ambulatory Visit: Payer: Medicare Other | Admitting: Cardiovascular Disease

## 2016-01-13 ENCOUNTER — Inpatient Hospital Stay: Payer: Medicare Other

## 2016-01-13 LAB — GLUCOSE, CAPILLARY
GLUCOSE-CAPILLARY: 138 mg/dL — AB (ref 65–99)
GLUCOSE-CAPILLARY: 83 mg/dL (ref 65–99)
GLUCOSE-CAPILLARY: 94 mg/dL (ref 65–99)
Glucose-Capillary: 97 mg/dL (ref 65–99)
Glucose-Capillary: 78 mg/dL (ref 65–99)
Glucose-Capillary: 125 mg/dL — ABNORMAL HIGH (ref 65–99)

## 2016-01-13 NOTE — Progress Notes (Signed)
Went in to assess pt, pt complained of numbness in both legs. Pedal pulses felt and pt extremity temperature WDL, no lack of color noted. Pt stated he could not feel nurse or aid pressing, or tapping on leg, but did on arm. Asked pt to press foot against resistance, pt continued to state he could not feel anything. Pt resting in bed. Unsure of pt actual baseline for physical movement, however pt stated he could ambulate with a walker, prior. Dr. Luberta Mutter notified. MRI orders placed, continue to assess.

## 2016-01-13 NOTE — Evaluation (Signed)
Physical Therapy Evaluation Patient Details Name: Taylor [name] MRN: [id number] DOB: 12/22/1970 Today's Date: 01/13/2016   History of Present Illness  45 yo M presented to ER from home with AMS, LOC during dialysis, and headache found to have septic shock, generalized weakness, and hypotensive. 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 Pt receives dialysis T/TH/S.  Clinical Impression  Pt presents very lethargic but answers questions. He demonstrates significant weakness and muscle atrophy. Pt is a poor historian, therefore PT contacted pt's father to obtain PLOF. The pt has not been ambulating, his father physically transfers pt and assists for ADLs. Seated balance and standing were not able to be formally tested due to level of lethargy. Based on muscle atrophy and need for AAROM for supine LE testing, pt's balance will likely be zero to poor. STR is recommended after hospital discharge to improve functional mobility, reduce caregiver burden of care and provide family training for safe mobility in the home. Pt will benefit from skilled PT services to increase functional I and mobility for safe discharge.     Follow Up Recommendations SNF;Supervision for mobility/OOB;Supervision/Assistance - 24 hour    Equipment Recommendations  None recommended by PT    Recommendations for Other Services       Precautions / Restrictions Precautions Precautions: Fall Restrictions Weight Bearing Restrictions: No      Mobility  Bed Mobility Overal bed mobility: +2 for physical assistance             General bed mobility comments: Difficult to assess due to lethargy  Transfers                 General transfer comment: NT due to degree of lethargy and decreased ability to follow commands.  Ambulation/Gait              General Gait Details: unable at this time  Stairs            Wheelchair Mobility    Modified Rankin (Stroke Patients Only)       Balance Overall balance assessment: Needs assistance     Sitting balance - Comments: Not formaly tested due to pt's lethargy. Based on clinical judgement and amount of general muscle atrophy, pt will likely demonstrate poor seated balance.                                     Pertinent Vitals/Pain Pain Assessment:  (States he has pain all over but can't provide details.)    Home Living Family/patient expects to be discharged to:: Private residence Living Arrangements: Parent Available Help at Discharge: Available 24 hours/day Type of Home: House Home Access: Stairs to enter Entrance Stairs-Rails: None Entrance Stairs-Number of Steps: 1 Home Layout: One level Home Equipment: Environmental consultant - 2 wheels;Wheelchair - manual      Prior Function Level of Independence: Needs assistance   Gait / Transfers Assistance Needed: Per pt's father, he assists pt for all transfers and mobility. Pt has not been ambulating.  ADL's / Homemaking Assistance Needed: Father assists pt with ADLs and homemaking.   Pt has had a functional decline over the past year and has required increased amount of assistance from his father.        Hand  Dominance        Extremity/Trunk Assessment   Upper Extremity Assessment: Difficult to assess due to impaired cognition           Lower Extremity Assessment: Difficult to assess due to impaired cognition (Grossly 2/5)         Communication      Cognition Arousal/Alertness: Lethargic;Suspect due to medications Behavior During Therapy: Flat affect Overall Cognitive Status: Impaired/Different from baseline Area of Impairment: Orientation;Attention;Memory;Following commands Orientation Level: Situation;Place Current Attention Level: Alternating;Selective   Following Commands: Follows one step  commands inconsistently       General Comments: Pt is lethargic and difficult to keep on task. Limited evaluation due to decreased arousal. Most PLOF information provided by pt's father over the phone.    General Comments General comments (skin integrity, edema, etc.): Foam dressing on B heels, edema and weeping of LEs    Exercises        Assessment/Plan    PT Assessment Patient needs continued PT services  PT Diagnosis Difficulty walking;Generalized weakness   PT Problem List Decreased strength;Decreased range of motion;Decreased activity tolerance;Decreased balance;Decreased mobility;Decreased cognition;Decreased safety awareness;Pain;Decreased skin integrity  PT Treatment Interventions DME instruction;Gait training;Stair training;Therapeutic activities;Therapeutic exercise;Balance training;Neuromuscular re-education;Patient/family education;Wheelchair mobility training   PT Goals (Current goals can be found in the Care Plan section) Acute Rehab PT Goals PT Goal Formulation: Patient unable to participate in goal setting Time For Goal Achievement: 01/27/16 Potential to Achieve Goals: Poor    Frequency Min 2X/week   Barriers to discharge Inaccessible home environment step to enter    Co-evaluation               End of Session Equipment Utilized During Treatment: Oxygen Activity Tolerance: Patient limited by lethargy Patient left: in bed;with call bell/phone within reach;with bed alarm set Nurse Communication: Mobility status         Time: 3428-7681 PT Time Calculation (min) (ACUTE ONLY): 20 min   Charges:   PT Evaluation $PT Eval High Complexity: 1 Procedure     PT G Codes:        Adelene Idler, PT, DPT  01/13/2016, 10:46 AM [phone]

## 2016-01-13 NOTE — Progress Notes (Signed)
[facility]  ROUNDING NOTE   Subjective:   Started to complain of bilateral lower extremity weakness and numbness. MRI ordered.   Hemodialysis yesterday. UF 2 litres Objective:  Vital signs in last 24 hours:  Temp:  [97.1 F (36.2 C)-97.5 F (36.4 C)] 97.5 F (36.4 C) (03/06 1244) Pulse Rate:  [78-80] 80 (03/06 1244) Resp:  [16-17] 16 (03/06 1244) BP: (105-117)/(46-53) 117/53 mmHg (03/06 1244) SpO2:  [96 %-100 %] 99 % (03/06 1244)  Weight change:  Filed Weights   01/08/16 2135 01/12/16 1125 01/12/16 1500  Weight: 86.1 kg (189 lb 13.1 oz) 90.3 kg (199 lb 1.2 oz) 87.8 kg (193 lb 9 oz)    Intake/Output: I/O last 3 completed shifts: In: 898 [I.V.:898] Out: 2000 [Other:2000]   Intake/Output this shift:  Total I/O In: 180 [I.V.:180] Out: -   Physical Exam: General: NAD, resting in bed  Head:  Moist oral mucosal membranes  Eyes: Anicteric,   Neck: Supple, trachea midline  Lungs:  Clear to auscultation normal effort  Heart: S1S2 no rubs, 2/6 murmur  Abdomen:  Soft, nontender, BS present, distended  Extremities: anasarca  Neurologic: Follows commands, not moving her lower extremities.   Skin: Chronic venous statis changes  Access: LUE AVF    Basic Metabolic Panel:  Recent Labs Lab 01/07/16 0633 01/08/16 1835 01/08/16 2125 01/09/16 0349 01/10/16 0529  NA 132* 130*  --  135 133*  K 3.4* 3.8  --  2.9* 3.7  CL 95* 94*  --  98* 97*  CO2 22 18*  --  25 23  GLUCOSE 82 194*  --  118* 168*  BUN 52* 66*  --  36* 43*  CREATININE 5.13* 6.11*  --  4.03* 4.66*  CALCIUM 8.0* 8.0*  --  7.9* 8.3*  PHOS  --   --  5.6*  --   --     Liver Function Tests:  Recent Labs Lab 01/07/16 0633  AST 21  ALT 10*  ALKPHOS 900*  BILITOT 0.8  PROT 6.4*  ALBUMIN 2.2*   No results for input(s): LIPASE, AMYLASE in the last 168 hours. No results for input(s): AMMONIA in the last 168 hours.  CBC:  Recent Labs Lab 01/07/16 0633 01/08/16 1835 01/09/16 0349  01/12/16 0740  WBC 16.8* 24.6* 15.6* 10.7*  HGB 8.6* 8.6* 9.4* 9.1*  HCT 27.8* 27.3* 29.3* 28.8*  MCV 89.7 89.5 89.1 89.3  PLT 116* 109* 110* 79*    Cardiac Enzymes:  Recent Labs Lab 01/07/16 0633  TROPONINI 0.08*    BNP: Invalid input(s): POCBNP  CBG:  Recent Labs Lab 01/12/16 2208 01/13/16 0025 01/13/16 0427 01/13/16 0752 01/13/16 1118  GLUCAP 114* 94 97 78 83    Microbiology: Results for orders placed or performed during the hospital encounter of 01/07/16  Blood Culture (routine x 2)     Status: None   Collection Time: 01/07/16  6:33 AM  Result Value Ref Range Status   Specimen Description BLOOD RTA  Final   Special Requests   Final    BOTTLES DRAWN AEROBIC AND ANAEROBIC AER ANA   Culture NO GROWTH 5 DAYS  Final   Report Status 01/12/2016 FINAL  Final  Blood Culture (routine x 2)     Status: None   Collection Time: 01/07/16  6:58 AM  Result Value Ref Range Status   Specimen Description BLOOD RIGHT ARM  Final   Special Requests   Final    BOTTLES DRAWN AEROBIC AND ANAEROBIC  AER ANA   Culture NO GROWTH 5 DAYS  Final   Report Status 01/12/2016 FINAL  Final  Urine culture     Status: None   Collection Time: 01/07/16 10:45 AM  Result Value Ref Range Status   Specimen Description URINE, RANDOM  Final   Special Requests NONE  Final   Culture >=100,000 COLONIES/mL ESCHERICHIA COLI  Final   Report Status 01/10/2016 FINAL  Final   Organism ID, Bacteria ESCHERICHIA COLI  Final      Susceptibility   Escherichia coli - MIC*    AMPICILLIN >=32 RESISTANT Resistant     CEFAZOLIN <=4 SENSITIVE Sensitive     CEFTRIAXONE <=1 SENSITIVE Sensitive     CIPROFLOXACIN >=4 RESISTANT Resistant     GENTAMICIN <=1 SENSITIVE Sensitive     IMIPENEM <=0.25 SENSITIVE Sensitive     NITROFURANTOIN <=16 SENSITIVE Sensitive     TRIMETH/SULFA >=320 RESISTANT Resistant     AMPICILLIN/SULBACTAM >=32 RESISTANT Resistant     PIP/TAZO <=4 SENSITIVE Sensitive     Extended  ESBL NEGATIVE Sensitive     * >=100,000 COLONIES/mL ESCHERICHIA COLI  MRSA PCR Screening     Status: None   Collection Time: 01/07/16 12:20 PM  Result Value Ref Range Status   MRSA by PCR NEGATIVE NEGATIVE Final    Comment:        The GeneXpert MRSA Assay (FDA approved for NASAL specimens only), is one component of a comprehensive MRSA colonization surveillance program. It is not intended to diagnose MRSA infection nor to guide or monitor treatment for MRSA infections.   C difficile quick scan w PCR reflex     Status: None   Collection Time: 01/11/16 10:57 AM  Result Value Ref Range Status   C Diff antigen NEGATIVE NEGATIVE Final   C Diff toxin NEGATIVE NEGATIVE Final   C Diff interpretation Negative for C. difficile  Final    Coagulation Studies: No results for input(s): LABPROT, INR in the last 72 hours.  Urinalysis: No results for input(s): COLORURINE, LABSPEC, PHURINE, GLUCOSEU, HGBUR, BILIRUBINUR, KETONESUR, PROTEINUR, UROBILINOGEN, NITRITE, LEUKOCYTESUR in the last 72 hours.  Invalid input(s): APPERANCEUR    Imaging: No results found.   Medications:   . sodium chloride 30 mL/hr at 01/12/16 1550   . antiseptic oral rinse  7 mL Mouth Rinse q12n4p  . aspirin EC  81 mg Oral Daily  . calcium-vitamin D  1 tablet Oral Daily  . carbamide peroxide  2 drop Left Ear BID  . cephALEXin  500 mg Oral Q24H  . chlorhexidine  15 mL Mouth Rinse BID  . cinacalcet  30 mg Oral Q supper  . citalopram  20 mg Oral Daily  . clopidogrel  75 mg Oral Daily  . docusate sodium  100 mg Oral BID  . epoetin (EPOGEN/PROCRIT) injection  10,000 Units Intravenous Q M,W,F-HD  . feeding supplement (NEPRO CARB STEADY)  237 mL Oral Q24H  . heparin  5,000 Units Subcutaneous 3 times per day  . insulin aspart  1-3 Units Subcutaneous 6 times per day  . insulin glargine  8 Units Subcutaneous QHS  . levothyroxine  75 mcg Oral Q0600  . omega-3 acid ethyl esters  1 g Oral Daily  . pantoprazole  40 mg  Oral Daily  . pregabalin  75 mg Oral BID  . rosuvastatin  20 mg Oral QHS  . sevelamer carbonate  800 mg Oral TID WC  . traZODone  50 mg Oral QHS   acetaminophen **  OR** acetaminophen, HYDROcodone-acetaminophen, midodrine, morphine injection, naLOXone (NARCAN)  injection, ondansetron **OR** ondansetron (ZOFRAN) IV  Assessment/ Plan:  45 y.o. 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 TTS  1.  ESRD on HD TTHS:  Last treatment on Sunday. Refused treatment on Saturday.  - Next treatment for Tuesday.   2. Weakness: MRI stat and consult neurology.   3. Urinary tract infection with sepsis: e. Coli on urine culture.  - .  Septic shock/UTI: cephalexain.   4.  Anemia of CKD:  hgb 9.1 - epogen with HD.  5.  Secondary Hyperparathyroidism: PTH low at 104 on 12/24/15. Phos on admission elevated at 5.6  - hold cinacalcet - continue sevelamer for binding.     LOS: 6 Rosezetta Balderston 3/6/20173:07 PM

## 2016-01-13 NOTE — Care Management Important Message (Signed)
Important Message  Patient Details  Name: Taylor [name] MRN: [id number] Date of Birth: 1971-04-21   Medicare Important Message Given:  Yes    Olegario Messier A Rage Beever 01/13/2016, 1:26 PM

## 2016-01-13 NOTE — Progress Notes (Signed)
[facility] House at [facility]   PATIENT NAME: Taylor [name]    MR#:  [id number]  DATE OF BIRTH:  September 30, 1971  SUBJECTIVE; patient complains of chronic back pain and generalized body pains. Did not get up with physical therapy yet.   CHIEF COMPLAINT:   Chief Complaint  Patient presents with  . Headache     Came septic, hypotensive on levophed. Have Ch suprapubic catheter and foley   BP is stable now.  does not have any diarrhea.     REVIEW OF SYSTEMS:  CONSTITUTIONAL: No fever, fatigue or weakness.  EYES: No blurred or double vision.  EARS, NOSE, AND THROAT: No tinnitus or ear pain.  RESPIRATORY: No cough, shortness of breath, wheezing or hemoptysis.  CARDIOVASCULAR: No chest pain, orthopnea, edema.  GASTROINTESTINAL: No nausea, vomiting, diarrhea or abdominal pain.  GENITOURINARY: No dysuria, hematuria.  ENDOCRINE: No polyuria, nocturia,  HEMATOLOGY: No anemia, easy bruising or bleeding SKIN: No rash or lesion. MUSCULOSKELETAL:c/o back pain  NEUROLOGIC: No tingling, numbness, weakness.  PSYCHIATRY; has  anxiety, depression.   ROS  DRUG ALLERGIES:   Allergies  Allergen Reactions  . Amoxicillin-Pot Clavulanate Nausea And Vomiting and Itching  . 2nd Skin Quick Heal Other (See Comments)  . Rifampin Nausea And Vomiting  . Tape Other (See Comments)    VITALS:  Blood pressure 105/46, pulse 80, temperature 97.1 F (36.2 C), temperature source Oral, resp. rate 17, height 5\' 11"  (1.803 m), weight 87.8 kg (193 lb 9 oz), SpO2 100 %.  PHYSICAL EXAMINATION:   GENERAL: 45 y.o.-year-old patient lying in the bed with no acute distress. Appears stable.  EYES: Pupils equal, round, reactive to light and accommodation. No scleral icterus. Extraocular muscles intact. Pallor HEENT: Head atraumatic, normocephalic. Oropharynx and nasopharynx clear.  NECK: Supple, no jugular venous distention. No thyroid enlargement, no tenderness.  LUNGS: Normal  breath sounds bilaterally, no wheezing, rales,rhonchi or crepitation. No use of accessory muscles of respiration.  CARDIOVASCULAR: S1, S2 normal. No murmurs, rubs, or gallops.  ABDOMEN: Soft, nontender, distended. Bowel sounds present. No organomegaly or mass. supra pubic catheter present. EXTREMITIES: ++pedal edema, nocyanosis, or clubbing.  NEUROLOGIC: Cranial nerves II through XII are intact. Subjective weakness. Unable to assess muscle strength at present. Sensation intact. Gait not checked.  PSYCHIATRIC: The patient is alert and oriented x 3.  SKIN: No obvious rash, lesion, or ulcer.  Patient has chronic skin changes in the lower extremity with venous stasis changes.   Physical Exam LABORATORY PANEL:   CBC  Recent Labs Lab 01/12/16 0740  WBC 10.7*  HGB 9.1*  HCT 28.8*  PLT 79*   ------------------------------------------------------------------------------------------------------------------  Chemistries   Recent Labs Lab 01/07/16 0633  01/10/16 0529  NA 132*  < > 133*  K 3.4*  < > 3.7  CL 95*  < > 97*  CO2 22  < > 23  GLUCOSE 82  < > 168*  BUN 52*  < > 43*  CREATININE 5.13*  < > 4.66*  CALCIUM 8.0*  < > 8.3*  AST 21  --   --   ALT 10*  --   --   ALKPHOS 900*  --   --   BILITOT 0.8  --   --   < > = values in this interval not displayed. ------------------------------------------------------------------------------------------------------------------  Cardiac Enzymes  Recent Labs Lab 01/07/16 0633  TROPONINI 0.08*   ------------------------------------------------------------------------------------------------------------------  RADIOLOGY:  Ct Head Wo Contrast  01/11/2016  CLINICAL DATA:  Admitted for sepsis.  History of dizziness and weakness. EXAM: CT HEAD WITHOUT CONTRAST TECHNIQUE: Contiguous axial images were obtained from the base of the skull through the vertex without intravenous contrast. COMPARISON:  01/07/2016; 06/13/2015 FINDINGS: Mild  age advanced atrophy with sulcal prominence. Gray-white differentiation is maintained without CT evidence of acute large territory infarct. No intraparenchymal or extra-axial mass or hemorrhage. Unchanged size and configuration of the ventricles and basilar cisterns. No midline shift. There is persistent near complete opacification of the left frontal sinus and scattered opacification of the left anterior ethmoidal air cells. There is persistent partial opacification of the bilateral mastoid air cells. The remaining paranasal sinuses are normally aerated. No air-fluid levels. Regional soft tissues demonstrate small vessel calcifications but otherwise normal. No displaced calvarial fracture. IMPRESSION: 1. Similar findings of age advanced atrophy without acute intracranial process. 2. Chronic sinus disease as above.  No air-fluid levels. Electronically Signed   By: Simonne Come M.D.   On: 01/11/2016 13:12    ASSESSMENT AND PLAN:   Active Problems:   Sepsis (HCC)  1. Sepsis/septic shock - source urine given pyuria - UTI -Urinalysis positive   Off the presors.BP  stable so we will discontinue IV hydration today. On admission given  vancomycin, levofloxacin and aztreonam On by mouth Keflex for Escherichia coli UTI.. -  2. ESRD on hemodialysis -Nephrology consultation for in-house dialysis -Fluid removal is admitted because of hypotension. patient receiving dialysis as per nephrology recommendation.  3. 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 Thea Silversmith from [facility] urology to see patient for suprapubic catheter functioning -He recommends to get bladder scan done to see patient has urinary retention  4. Hypotension start with Coreg today. Blood pressure is still kind of borderline so I would not restart Imdur and lisinopril yet. 5. Headaches with sinus pain with history of sinusitis appears chronic -When necessary Norco #6 complains of blurred  vision: CT head is negative for acute infarct. Underlying diabetic retinopathy and needs eye doctor appointment as an outpatient  6. Anemia of chronic disease; stable.  7. DVT prophylaxis Subcutaneous heparin  8. Hypokalemia   Pt is a dialysis pt, monitor potassium. #9 deconditioning ;physical therapy consult. All the records are reviewed and case discussed with Care Management/Social Workerr. Management plans discussed with the patient, family and they are in agreement.  CODE STATUS: [place].  TOTAL TIME TAKING CARE OF THIS PATIENT: 20 minutes.    POSSIBLE D/C IN 1-2 DAYS, DEPENDING ON CLINICAL CONDITION.   Katha Hamming M.D on 01/13/2016   Between 7am to 6pm - Pager - [phone]  After 6pm go to [email or web address] - password [email or web address]  [facility]  Office  [phone]  CC: Primary care physician; [name], MD  Note: This dictation was prepared with Dragon dictation along with smaller phrase technology. Any transcriptional errors that result from this process are unintentional.

## 2016-01-14 ENCOUNTER — Inpatient Hospital Stay: Payer: Medicare Other

## 2016-01-14 DIAGNOSIS — G8220 Paraplegia, unspecified: Secondary | ICD-10-CM

## 2016-01-14 LAB — CBC
HEMATOCRIT: 29.2 % — AB (ref 40.0–52.0)
HEMOGLOBIN: 9.1 g/dL — AB (ref 13.0–18.0)
MCH: 27.7 pg (ref 26.0–34.0)
MCHC: 31.3 g/dL — AB (ref 32.0–36.0)
MCV: 88.5 fL (ref 80.0–100.0)
Platelets: 94 10*3/uL — ABNORMAL LOW (ref 150–440)
RBC: 3.3 MIL/uL — AB (ref 4.40–5.90)
RDW: 16.7 % — ABNORMAL HIGH (ref 11.5–14.5)
WBC: 9.7 10*3/uL (ref 3.8–10.6)

## 2016-01-14 LAB — RENAL FUNCTION PANEL
ALBUMIN: 2 g/dL — AB (ref 3.5–5.0)
ANION GAP: 11 (ref 5–15)
BUN: 34 mg/dL — ABNORMAL HIGH (ref 6–20)
CO2: 25 mmol/L (ref 22–32)
Calcium: 8.3 mg/dL — ABNORMAL LOW (ref 8.9–10.3)
Chloride: 96 mmol/L — ABNORMAL LOW (ref 101–111)
Creatinine, Ser: 4.77 mg/dL — ABNORMAL HIGH (ref 0.61–1.24)
GFR calc non Af Amer: 13 mL/min — ABNORMAL LOW (ref >60)
GFR, EST AFRICAN AMERICAN: 16 mL/min — AB (ref >60)
GLUCOSE: 107 mg/dL — AB (ref 65–99)
PHOSPHORUS: 5.2 mg/dL — AB (ref 2.5–4.6)
POTASSIUM: 4.2 mmol/L (ref 3.5–5.1)
Sodium: 132 mmol/L — ABNORMAL LOW (ref 135–145)

## 2016-01-14 LAB — GLUCOSE, CAPILLARY
GLUCOSE-CAPILLARY: 116 mg/dL — AB (ref 65–99)
GLUCOSE-CAPILLARY: 93 mg/dL (ref 65–99)
GLUCOSE-CAPILLARY: 104 mg/dL — AB (ref 65–99)
GLUCOSE-CAPILLARY: 133 mg/dL — AB (ref 65–99)
Glucose-Capillary: 132 mg/dL — ABNORMAL HIGH (ref 65–99)

## 2016-01-14 NOTE — Progress Notes (Signed)
[facility] [place] [facility]   PATIENT NAME: Taylor [name]    MR#:  [id number]  DATE OF BIRTH:  09-17-1971  SUBJECTIVE;c/o difficulty  With legs and unable to  Move legs.no weakness in hand or legs.c/o feeling poor all the time.  CHIEF COMPLAINT:   Chief Complaint  Patient presents with  . Headache     Came septic, hypotensive on levophed. Have Ch suprapubic catheter and foley   BP is stable now.  does not have any diarrhea.     REVIEW OF SYSTEMS:  CONSTITUTIONAL: No fever, fatigue or weakness.  EYES: No blurred or double vision.  EARS, NOSE, AND THROAT: No tinnitus or ear pain. But very hard of hearing. RESPIRATORY: No cough, shortness of breath, wheezing or hemoptysis.  CARDIOVASCULAR: No chest pain, orthopnea, edema.  GASTROINTESTINAL: No nausea, vomiting, diarrhea or abdominal pain.  GENITOURINARY: No dysuria, hematuria.  ENDOCRINE: No polyuria, nocturia,  HEMATOLOGY: No anemia, easy bruising or bleeding SKIN: No rash or lesion. MUSCULOSKELETAL:c/o back pain  NEUROLOGIC:bilateral leg weakness.unalble to  Move legs,no slurred speech.no tingling numbness in legs. PSYCHIATRY; has  anxiety, depression.   ROS  DRUG ALLERGIES:   Allergies  Allergen Reactions  . Amoxicillin-Pot Clavulanate Nausea And Vomiting and Itching  . 2nd Skin Quick Heal Other (See Comments)  . Rifampin Nausea And Vomiting  . Tape Other (See Comments)    VITALS:  Blood pressure 120/65, pulse 79, temperature 97.7 F (36.5 C), temperature source Oral, resp. rate 13, height 5\' 11"  (1.803 m), weight 91.9 kg (202 lb 9.6 oz), SpO2 100 %.  PHYSICAL EXAMINATION:   GENERAL: 45 y.o.-year-old patient lying in the bed with no acute distress. Appears stable.  EYES: Pupils equal, round, reactive to light and accommodation. No scleral icterus. Extraocular muscles intact. Pallor HEENT: Head atraumatic, normocephalic. Oropharynx and nasopharynx clear.  NECK: Supple, no  jugular venous distention. No thyroid enlargement, no tenderness.  LUNGS: Normal breath sounds bilaterally, no wheezing, rales,rhonchi or crepitation. No use of accessory muscles of respiration.  CARDIOVASCULAR: S1, S2 normal. No murmurs, rubs, or gallops.  ABDOMEN: Soft, nontender, distended. Bowel sounds present. No organomegaly or mass. supra pubic catheter present. EXTREMITIES: ++pedal edema, nocyanosis, or clubbing.  NEUROLOGIC: Cranial nerves II through XII are intact. Subjective weakness. Unable move legs bilaterally,power 2/5 bilaterally PSYCHIATRIC: The patient is alert and oriented x 3.  SKIN: No obvious rash, lesion, or ulcer.  Patient has chronic skin changes in the lower extremity with venous stasis changes.   Physical Exam LABORATORY PANEL:   CBC  Recent Labs Lab 01/12/16 0740  WBC 10.7*  HGB 9.1*  HCT 28.8*  PLT 79*   ------------------------------------------------------------------------------------------------------------------  Chemistries   Recent Labs Lab 01/10/16 0529  NA 133*  K 3.7  CL 97*  CO2 23  GLUCOSE 168*  BUN 43*  CREATININE 4.66*  CALCIUM 8.3*   ------------------------------------------------------------------------------------------------------------------  Cardiac Enzymes No results for input(s): TROPONINI in the last 168 hours. ------------------------------------------------------------------------------------------------------------------  RADIOLOGY:  Mr Cervical Spine Wo Contrast  01/13/2016  CLINICAL DATA:  Numbness in both legs. EXAM: MRI CERVICAL SPINE WITHOUT CONTRAST TECHNIQUE: Multiplanar, multisequence MR imaging of the cervical spine was performed. No intravenous contrast was administered. COMPARISON:  MRI lumbar spine from the same day. MRI of the cervical spine 06/23/2015. FINDINGS: Normal signal is present in the cervical and upper thoracic spinal cord to the lowest imaged level, C7-T1. Chronic endplate marrow  changes are again noted at C5-6 with slight retrolisthesis at this level. There  is straightening of the normal cervical lordosis. Vertebral body heights alignment are otherwise normal. The left vertebral artery is occluded. The craniocervical junction is within normal limits. Flow is present in the right vertebral artery. C2-3:  Negative. C3-4:  Negative. C4-5: Mild uncovertebral spurring is present on the left without significant stenosis. C5-6: A broad-based disc osteophyte complex is present. This effaces the ventral CSF. Moderate central and bilateral foraminal stenosis is present, right greater than left. C6-7: A left paramedian disc protrusion is present. Moderate left central canal and foraminal stenosis is similar to the prior study. The right foramen is patent. C7-T1:  Negative. IMPRESSION: 1. Stable broad-based disc osteophyte complex with moderate central and right greater than left foraminal stenosis at C5-6. 2. Stable left paramedian disc protrusion with moderate left central and foraminal stenosis at C6-7. 3. Chronic occlusion of the left vertebral artery. Electronically Signed   By: Marin Roberts M.D.   On: 01/13/2016 15:14   Mr Thoracic Spine Wo Contrast  01/14/2016  ADDENDUM REPORT: 01/14/2016 15:34 ADDENDUM: The original report was by Dr. Gaylyn Rong. The following addendum is by Dr. Gaylyn Rong: Due to the specific clinical concern regarding the thoracolumbar junction and the suboptimal visualization of the thoracolumbar junction on the thoracic and lumbar spine MRI exams, we have the patient return to perform specific assessment of the thoracolumbar junction. This includes vertebral levels from T9 through L1. The cord in this vicinity demonstrates no abnormal edema on inversion recovery or T2 weighted images to suggest cord infarct the cord lesion. No impingement on the lower thoracic cord in this region is identified. Incidental note is made of bilateral perirenal stranding.  Electronically Signed   By: Gaylyn Rong M.D.   On: 01/14/2016 15:34  01/14/2016  CLINICAL DATA:  Numbness in both lower extremities. EXAM: MRI THORACIC SPINE WITHOUT CONTRAST TECHNIQUE: Multiplanar, multisequence MR imaging of the thoracic spine was performed. No intravenous contrast was administered. COMPARISON:  Multiple exams, including 02/20/2015 FINDINGS: Despite efforts by the technologist and patient, motion artifact is present on today's exam and could not be eliminated. This reduces exam sensitivity and specificity. Small to moderate bilateral pleural effusions are observed. We see down to the T11 level although T11 is distorted. The T12 level is mostly included on the prior lumbar spine MRI. No vertebral subluxation is observed. No significant abnormal spinal cord signal is observed. Subcutaneous edema noted along the patient's back. No vertebral edema noted. There is mild prominence of epidural adipose tissues in the thoracic spine dorsally. In the thoracic spine, no significant spondylosis or degenerative disc disease is observed. No central or foraminal impingement is identified. Again, T11 is distorted in the T12 level is not well seen due to boundary artifact. IMPRESSION: 1. No impingement in the thoracic spine is identified. Please note that the T12 level is not well seen in the T11 level is distorted due to boundary artifact. The T12 level was mostly included on the prior lumbar MRI. 2. Small to moderate bilateral pleural effusions. 3. Mild prominence of dorsal epidural adipose tissues. Electronically Signed: By: Gaylyn Rong M.D. On: 01/14/2016 10:54   Mr Lumbar Spine Wo Contrast  01/13/2016  CLINICAL DATA:  45 year old male with numbness in both lower extremities. Initial encounter. End-stage renal disease patient. EXAM: MRI LUMBAR SPINE WITHOUT CONTRAST TECHNIQUE: Multiplanar, multisequence MR imaging of the lumbar spine was performed. No intravenous contrast was administered.  COMPARISON:  CT chest abdomen and pelvis 02/20/2015. FINDINGS: Suboptimal signal to noise on today images  due to fast imaging protocol utilized secondary to severe patient pain during imaging. Normal lumbar segmentation demonstrated on the comparison. Lumbar vertebral height and alignment appears stable. No marrow edema or evidence of acute osseous abnormality. Generalized subcutaneous edema similar to the prior CT. Suggestion of generalized bilateral paraspinal muscle T2 and STIR hyperintensity. No intramuscular fluid collection identified. Native renal atrophy. Otherwise negative visualized abdominal viscera. Suboptimal visualization of the lower thoracic spinal cord and conus. The following superimposed degenerative changes are noted: T12-L1:  Negative. L1-L2:  Mild facet hypertrophy, otherwise negative. L2-L3:  Negative. L3-L4: Disc desiccation and mild disc space loss. Central and slightly caudal small to moderate size disc protrusion (series 100, image 8 and series 9, image 28. Mild epidural lipomatosis superimposed. Moderate to severe spinal stenosis at this level. No foraminal stenosis. L4-L5: Mild epidural lipomatosis. Negative disc. Mild facet hypertrophy. L5-S1: Epidural lipomatosis mostly effacing CSF from the thecal sac. Negative disc. No foraminal stenosis. IMPRESSION: 1. Isolated lumbar disc degeneration at L3-L4 where a central disc protrusion contributes to moderate to severe spinal stenosis when combined with mild epidural lipomatosis and facet hypertrophy. 2.  No acute osseous abnormality identified in the lumbar spine. 3. Nonspecific generalized lumbar erector spinae muscle edema. This can be seen in debilitated patients but or inflammatory myositis is not excluded. Superimposed generalized subcutaneous edema compatible with anasarca, and similar to the 2016 CT. Electronically Signed   By: Odessa Fleming M.D.   On: 01/13/2016 15:20    ASSESSMENT AND PLAN:   Active Problems:   Sepsis (HCC)  1.  Sepsis/septic shock - source urine given pyuria - UTI -Urinalysis positive   Off the presors.BP  stable so we will discontinue IV hydration today. On admission given  vancomycin, levofloxacin and aztreonam On by mouth Keflex for Escherichia coli UTI.. -  2. ESRD on hemodialysis -Nephrology consultation for in-house dialysis -Fluid removal is admitted because of hypotension. patient receiving dialysis as per nephrology recommendation.  3. 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 Thea Silversmith from [facility] urology to see patient for suprapubic catheter functioning -He recommends to get bladder scan done to see patient has urinary retention  4. Hypotension start with Coreg today. Blood pressure is still kind of borderline so I would not restart Imdur and lisinopril yet. 5. Headaches with sinus pain with history of sinusitis appears chronic -When necessary Norco #6 complains of blurred vision: CT head is negative for acute infarct. Underlying diabetic retinopathy and needs eye doctor appointment as an outpatient  6. Anemia of chronic disease; stable.  7. DVT prophylaxis Subcutaneous heparin  8. Hypokalemia   Pt is a dialysis pt, monitor potassium. #9.bilateral leg wekness;seen by neruology.MRI LS PINE non diagnostic,neurology recommends LP,will hold heparin,order IR guided LP tomorrow,d/w DR.Reynolds All the records are reviewed and case discussed with Care Management/Social Workerr. Management plans discussed with the patient, family and they are in agreement.  CODE STATUS: [place].  TOTAL TIME TAKING CARE OF THIS PATIENT: 20 minutes.    POSSIBLE D/C IN 1-2 DAYS, DEPENDING ON CLINICAL CONDITION.   Katha Hamming M.D on 01/14/2016   Between 7am to 6pm - Pager - [phone]  After 6pm go to [email or web address] - password [email or web address]  [facility]  Office  [phone]  CC: Primary care physician; [name],  MD  Note: This dictation was prepared with Dragon dictation along with smaller phrase technology. Any transcriptional errors that result from this process are unintentional.

## 2016-01-14 NOTE — Progress Notes (Signed)
Hd ended per patient request

## 2016-01-14 NOTE — Consult Note (Signed)
   [facility] CM Inpatient Consult   01/14/2016  Taylor [name] 1971/02/18 [id number]  01/13/2016 Met with patient at bedside, he is currently active with [facility] care management and is agreeable to resume services at discharge. .  Reminded that the services does not interfere with or replace any services set up by the inpatient care management team.  For questions, please contact:  Joylene Draft, RN, [place] Management/Hospital Liaison [phone]- Mobile [phone]- [place]

## 2016-01-14 NOTE — Progress Notes (Signed)
PT Cancellation Note  Patient Details Name: Taylor [name] MRN: [id number] DOB: 1971/03/11   Cancelled Treatment:    Reason Eval/Treat Not Completed: Patient at procedure or test/unavailable. Pt is gone for hemodialysis and is unavailable to participate in therapy. PT will f/u and resume PT when appropriate.   Adelene Idler, PT, DPT  01/14/2016, 3:45 PM [phone]

## 2016-01-14 NOTE — Progress Notes (Signed)
[facility]  ROUNDING NOTE   Subjective:   Refusing dialysis at this time.  Objective:  Vital signs in last 24 hours:  Temp:  [97.5 F (36.4 C)-97.7 F (36.5 C)] 97.5 F (36.4 C) (03/07 1156) Pulse Rate:  [79-80] 79 (03/07 1156) Resp:  [16-18] 16 (03/07 1156) BP: (115-126)/(53-67) 126/63 mmHg (03/07 1156) SpO2:  [100 %] 100 % (03/07 1156)  Weight change:  Filed Weights   01/08/16 2135 01/12/16 1125 01/12/16 1500  Weight: 86.1 kg (189 lb 13.1 oz) 90.3 kg (199 lb 1.2 oz) 87.8 kg (193 lb 9 oz)    Intake/Output: I/O last 3 completed shifts: In: 1035 [P.O.:240; I.V.:795] Out: 20 [Urine:20]   Intake/Output this shift:  Total I/O In: 120 [I.V.:120] Out: -   Physical Exam: General: NAD, resting in bed  Head:  Moist oral mucosal membranes  Eyes: Anicteric,   Neck: Supple, trachea midline  Lungs:  Clear to auscultation normal effort  Heart: S1S2 no rubs, 2/6 murmur  Abdomen:  Soft, nontender, BS present, distended  Extremities: anasarca  Neurologic: Follows commands, not moving her lower extremities.   Skin: Chronic venous statis changes  Access: LUE AVF    Basic Metabolic Panel:  Recent Labs Lab 01/08/16 1835 01/08/16 2125 01/09/16 0349 01/10/16 0529  NA 130*  --  135 133*  K 3.8  --  2.9* 3.7  CL 94*  --  98* 97*  CO2 18*  --  25 23  GLUCOSE 194*  --  118* 168*  BUN 66*  --  36* 43*  CREATININE 6.11*  --  4.03* 4.66*  CALCIUM 8.0*  --  7.9* 8.3*  PHOS  --  5.6*  --   --     Liver Function Tests: No results for input(s): AST, ALT, ALKPHOS, BILITOT, PROT, ALBUMIN in the last 168 hours. No results for input(s): LIPASE, AMYLASE in the last 168 hours. No results for input(s): AMMONIA in the last 168 hours.  CBC:  Recent Labs Lab 01/08/16 1835 01/09/16 0349 01/12/16 0740  WBC 24.6* 15.6* 10.7*  HGB 8.6* 9.4* 9.1*  HCT 27.3* 29.3* 28.8*  MCV 89.5 89.1 89.3  PLT 109* 110* 79*    Cardiac Enzymes: No results for input(s): CKTOTAL,  CKMB, CKMBINDEX, TROPONINI in the last 168 hours.  BNP: Invalid input(s): POCBNP  CBG:  Recent Labs Lab 01/13/16 2006 01/13/16 2356 01/14/16 0434 01/14/16 0825 01/14/16 1121  GLUCAP 138* 132* 116* 93 104*    Microbiology: Results for orders placed or performed during the hospital encounter of 01/07/16  Blood Culture (routine x 2)     Status: None   Collection Time: 01/07/16  6:33 AM  Result Value Ref Range Status   Specimen Description BLOOD RTA  Final   Special Requests   Final    BOTTLES DRAWN AEROBIC AND ANAEROBIC AER ANA   Culture NO GROWTH 5 DAYS  Final   Report Status 01/12/2016 FINAL  Final  Blood Culture (routine x 2)     Status: None   Collection Time: 01/07/16  6:58 AM  Result Value Ref Range Status   Specimen Description BLOOD RIGHT ARM  Final   Special Requests   Final    BOTTLES DRAWN AEROBIC AND ANAEROBIC AER ANA   Culture NO GROWTH 5 DAYS  Final   Report Status 01/12/2016 FINAL  Final  Urine culture     Status: None   Collection Time: 01/07/16 10:45 AM  Result Value Ref Range  Status   Specimen Description URINE, RANDOM  Final   Special Requests NONE  Final   Culture >=100,000 COLONIES/mL ESCHERICHIA COLI  Final   Report Status 01/10/2016 FINAL  Final   Organism ID, Bacteria ESCHERICHIA COLI  Final      Susceptibility   Escherichia coli - MIC*    AMPICILLIN >=32 RESISTANT Resistant     CEFAZOLIN <=4 SENSITIVE Sensitive     CEFTRIAXONE <=1 SENSITIVE Sensitive     CIPROFLOXACIN >=4 RESISTANT Resistant     GENTAMICIN <=1 SENSITIVE Sensitive     IMIPENEM <=0.25 SENSITIVE Sensitive     NITROFURANTOIN <=16 SENSITIVE Sensitive     TRIMETH/SULFA >=320 RESISTANT Resistant     AMPICILLIN/SULBACTAM >=32 RESISTANT Resistant     PIP/TAZO <=4 SENSITIVE Sensitive     Extended ESBL NEGATIVE Sensitive     * >=100,000 COLONIES/mL ESCHERICHIA COLI  MRSA PCR Screening     Status: None   Collection Time: 01/07/16 12:20 PM  Result Value Ref Range  Status   MRSA by PCR NEGATIVE NEGATIVE Final    Comment:        The GeneXpert MRSA Assay (FDA approved for NASAL specimens only), is one component of a comprehensive MRSA colonization surveillance program. It is not intended to diagnose MRSA infection nor to guide or monitor treatment for MRSA infections.   C difficile quick scan w PCR reflex     Status: None   Collection Time: 01/11/16 10:57 AM  Result Value Ref Range Status   C Diff antigen NEGATIVE NEGATIVE Final   C Diff toxin NEGATIVE NEGATIVE Final   C Diff interpretation Negative for C. difficile  Final    Coagulation Studies: No results for input(s): LABPROT, INR in the last 72 hours.  Urinalysis: No results for input(s): COLORURINE, LABSPEC, PHURINE, GLUCOSEU, HGBUR, BILIRUBINUR, KETONESUR, PROTEINUR, UROBILINOGEN, NITRITE, LEUKOCYTESUR in the last 72 hours.  Invalid input(s): APPERANCEUR    Imaging: Mr Cervical Spine Wo Contrast  01/13/2016  CLINICAL DATA:  Numbness in both legs. EXAM: MRI CERVICAL SPINE WITHOUT CONTRAST TECHNIQUE: Multiplanar, multisequence MR imaging of the cervical spine was performed. No intravenous contrast was administered. COMPARISON:  MRI lumbar spine from the same day. MRI of the cervical spine 06/23/2015. FINDINGS: Normal signal is present in the cervical and upper thoracic spinal cord to the lowest imaged level, C7-T1. Chronic endplate marrow changes are again noted at C5-6 with slight retrolisthesis at this level. There is straightening of the normal cervical lordosis. Vertebral body heights alignment are otherwise normal. The left vertebral artery is occluded. The craniocervical junction is within normal limits. Flow is present in the right vertebral artery. C2-3:  Negative. C3-4:  Negative. C4-5: Mild uncovertebral spurring is present on the left without significant stenosis. C5-6: A broad-based disc osteophyte complex is present. This effaces the ventral CSF. Moderate central and bilateral  foraminal stenosis is present, right greater than left. C6-7: A left paramedian disc protrusion is present. Moderate left central canal and foraminal stenosis is similar to the prior study. The right foramen is patent. C7-T1:  Negative. IMPRESSION: 1. Stable broad-based disc osteophyte complex with moderate central and right greater than left foraminal stenosis at C5-6. 2. Stable left paramedian disc protrusion with moderate left central and foraminal stenosis at C6-7. 3. Chronic occlusion of the left vertebral artery. Electronically Signed   By: Marin Roberts M.D.   On: 01/13/2016 15:14   Mr Thoracic Spine Wo Contrast  01/14/2016  CLINICAL DATA:  Numbness in both lower extremities.  EXAM: MRI THORACIC SPINE WITHOUT CONTRAST TECHNIQUE: Multiplanar, multisequence MR imaging of the thoracic spine was performed. No intravenous contrast was administered. COMPARISON:  Multiple exams, including 02/20/2015 FINDINGS: Despite efforts by the technologist and patient, motion artifact is present on today's exam and could not be eliminated. This reduces exam sensitivity and specificity. Small to moderate bilateral pleural effusions are observed. We see down to the T11 level although T11 is distorted. The T12 level is mostly included on the prior lumbar spine MRI. No vertebral subluxation is observed. No significant abnormal spinal cord signal is observed. Subcutaneous edema noted along the patient's back. No vertebral edema noted. There is mild prominence of epidural adipose tissues in the thoracic spine dorsally. In the thoracic spine, no significant spondylosis or degenerative disc disease is observed. No central or foraminal impingement is identified. Again, T11 is distorted in the T12 level is not well seen due to boundary artifact. IMPRESSION: 1. No impingement in the thoracic spine is identified. Please note that the T12 level is not well seen in the T11 level is distorted due to boundary artifact. The T12 level  was mostly included on the prior lumbar MRI. 2. Small to moderate bilateral pleural effusions. 3. Mild prominence of dorsal epidural adipose tissues. Electronically Signed   By: Gaylyn Rong M.D.   On: 01/14/2016 10:54   Mr Lumbar Spine Wo Contrast  01/13/2016  CLINICAL DATA:  45 year old male with numbness in both lower extremities. Initial encounter. End-stage renal disease patient. EXAM: MRI LUMBAR SPINE WITHOUT CONTRAST TECHNIQUE: Multiplanar, multisequence MR imaging of the lumbar spine was performed. No intravenous contrast was administered. COMPARISON:  CT chest abdomen and pelvis 02/20/2015. FINDINGS: Suboptimal signal to noise on today images due to fast imaging protocol utilized secondary to severe patient pain during imaging. Normal lumbar segmentation demonstrated on the comparison. Lumbar vertebral height and alignment appears stable. No marrow edema or evidence of acute osseous abnormality. Generalized subcutaneous edema similar to the prior CT. Suggestion of generalized bilateral paraspinal muscle T2 and STIR hyperintensity. No intramuscular fluid collection identified. Native renal atrophy. Otherwise negative visualized abdominal viscera. Suboptimal visualization of the lower thoracic spinal cord and conus. The following superimposed degenerative changes are noted: T12-L1:  Negative. L1-L2:  Mild facet hypertrophy, otherwise negative. L2-L3:  Negative. L3-L4: Disc desiccation and mild disc space loss. Central and slightly caudal small to moderate size disc protrusion (series 100, image 8 and series 9, image 28. Mild epidural lipomatosis superimposed. Moderate to severe spinal stenosis at this level. No foraminal stenosis. L4-L5: Mild epidural lipomatosis. Negative disc. Mild facet hypertrophy. L5-S1: Epidural lipomatosis mostly effacing CSF from the thecal sac. Negative disc. No foraminal stenosis. IMPRESSION: 1. Isolated lumbar disc degeneration at L3-L4 where a central disc protrusion  contributes to moderate to severe spinal stenosis when combined with mild epidural lipomatosis and facet hypertrophy. 2.  No acute osseous abnormality identified in the lumbar spine. 3. Nonspecific generalized lumbar erector spinae muscle edema. This can be seen in debilitated patients but or inflammatory myositis is not excluded. Superimposed generalized subcutaneous edema compatible with anasarca, and similar to the 2016 CT. Electronically Signed   By: Odessa Fleming M.D.   On: 01/13/2016 15:20     Medications:   . sodium chloride 30 mL/hr at 01/14/16 0548   . antiseptic oral rinse  7 mL Mouth Rinse q12n4p  . aspirin EC  81 mg Oral Daily  . calcium-vitamin D  1 tablet Oral Daily  . carbamide peroxide  2 drop Left  Ear BID  . cephALEXin  500 mg Oral Q24H  . chlorhexidine  15 mL Mouth Rinse BID  . citalopram  20 mg Oral Daily  . clopidogrel  75 mg Oral Daily  . docusate sodium  100 mg Oral BID  . epoetin (EPOGEN/PROCRIT) injection  10,000 Units Intravenous Q M,W,F-HD  . feeding supplement (NEPRO CARB STEADY)  237 mL Oral Q24H  . heparin  5,000 Units Subcutaneous 3 times per day  . insulin aspart  1-3 Units Subcutaneous 6 times per day  . insulin glargine  8 Units Subcutaneous QHS  . levothyroxine  75 mcg Oral Q0600  . omega-3 acid ethyl esters  1 g Oral Daily  . pantoprazole  40 mg Oral Daily  . pregabalin  75 mg Oral BID  . rosuvastatin  20 mg Oral QHS  . sevelamer carbonate  800 mg Oral TID WC  . traZODone  50 mg Oral QHS   acetaminophen **OR** acetaminophen, HYDROcodone-acetaminophen, midodrine, morphine injection, naLOXone (NARCAN)  injection, ondansetron **OR** ondansetron (ZOFRAN) IV  Assessment/ Plan:  45 y.o. 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 TTS  1.  ESRD on HD TTHS:  Last treatment on Sunday. Refused treatment on Saturday and now today.   - Next treatment for Thursday.   2. Weakness: MRI stat and consulted neurology.   3. Urinary tract infection with sepsis: e. Coli on urine culture.  - .  Septic shock/UTI: cephalexain.   4.  Anemia of CKD:  hgb 9.1 - epogen with HD.  5.  Secondary Hyperparathyroidism: PTH low at 104 on 12/24/15. Phos on admission elevated at 5.6  - hold cinacalcet - continue sevelamer for binding.    Overall prognosis is poor. Consult Palliative care.    LOS: 7 Niko Penson 3/7/20172:22 PM

## 2016-01-14 NOTE — Progress Notes (Signed)
Post hd tx 

## 2016-01-14 NOTE — Progress Notes (Signed)
Pre-hd tx 

## 2016-01-14 NOTE — Progress Notes (Signed)
Pt stated he did not feel well. He stated did not want to go to dialysis even after being re-educated on importance of not skipping days. Dr. Ronn Melena also aware and spoke to patient regarding need for dialysis.  If pt changes mind, dialysis nurse offered to still take him today. Continue to assess.

## 2016-01-14 NOTE — Care Management (Signed)
PT currently recommending SNF.  Patient willing to discuss SNF with CSW if patient deemed to have a skillable need.  If patient returns home he has agreed to allow me to set up home health services.  Patient request that I call his father and discuss with him.  Father is also willing to discuss SNF placement.  If patient returns home with home health services he states that he does not have a preference of agency.  Neurology consult pending.  rncm following

## 2016-01-14 NOTE — Progress Notes (Signed)
Patient stated that he is only completing 2hours of a prescribed 3hour treatment.Patient stated "I am tired,I can only do 2 hours."Patient had originally refused to come for treatment,when first called down for dialysis.Patient educated on adverse effects of not completing treatment time as it relates to fluid overload and high potassium levels,patient verbalized understanding and still only wanted 2hours of treatment despite encouraging full treatment time.Dr.Kolluru notified.AMA formed signed.

## 2016-01-14 NOTE — Consult Note (Signed)
Reason for Consult:Lower extremity weakness and numbness Referring Physician: Luberta Mutter  CC: Lower extremity numbness and weakness  HPI: Taylor [name] is an 45 y.o. male admitted for lethargy and respiratory distress on 01/09/2016.  Was hypotensive and felt to be in septic shock.  From review of the record due to his lethargy it was difficult to perform exams for much of his hospitalization but per his current physician he was able to raise his legs on Saturday.  The patient reports that on yesterday he noted that he could not lift his legs and that he was numb from the waist down.  When pressed he is not quite sure when his symptoms began due to his previous lethargy.  He also reports bowel incontinence for the past month.  Although the patient reports he was able to get around at home on speaking with his family he has been bed bound and full assist at home prior to admission.    Past Medical History  Diagnosis Date  . End stage renal disease on dialysis (HCC)     LUE fistula  . Type I diabetes mellitus (HCC)     a. 03/2014 admitted with HNK to [facility]. b. TTS  . Diabetic neuropathy (HCC)     severe, s/p multiple toe amputation  . Hypothyroidism   . Hypertension   . Hyperlipidemia   . H/O hiatal hernia   . GERD (gastroesophageal reflux disease)   . Anxiety   . Sebaceous cyst     side of neck  . Pneumonia     2010  . Anemia     a. req PRBC's 2011.  [name] PAD (peripheral artery disease) (HCC)     a. s/p amputation of toes on the right;  b. left LE claudication.  . Coronary artery disease     a. s/p MI;  b. 10/2009 CABG x 3 @ [facility]: LIMA->LAD, VG->OM3, VG->RPDA; c. 11/2010 Cath 3/3 patent grafts;  d 12/2012 Cath: LM 30d, LAD 85p, D1 70, D2 90, LCX 40ost, OM2 100, RCA 90p, 168m, L->LAD ok, VG->OM3 ok, VG->RPDA 30, EF 50%->Med Rx.  . Cataract     right  . Valvular disease     a. 11/2012 Echo: EF 55-60%, mild LVH, mild MR, mild bi-atrial enlargement, mild-mod TR, PASP ; b. echo 03/2014:  EF 50-55%, nl WM, select images concerning for bicuspid aortic valve w/ nl thicknes of leaflets, mild MR, mild bi-atrial dilatation, RV mild dilatation - wall thickness nl, mod TR - select images appears to be mod to sev, PASP at least mod elevated.  . Diastolic CHF (HCC)     see echo above  . Depression   . Arthritis     rheumatoid arthritis   . Myocardial infarction (HCC) 2010  . Pulmonary HTN (HCC)     a. continuation of 03/2014 echo PASP @ least mod elevated. RVSP 52 mm Hg. Parasternal long axis estimated @ 85 mm Hg  . Scrotal abscess     a. s/p multiple I&D  . Penile abscess     a. s/p multiple I&D  . Hematemesis     a. EGD 2016 with LA Grade C esophagitis, continued on Protonix   . Chronic respiratory failure (HCC)     a. on 3L oxygen via nasal cannula  . Anxiety   . Acute delirium   . Sepsis (HCC)   . 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 MYOCARDIAL INFARCTION, HX OF 01/26/2011    Qualifier: Diagnosis of  By:  [name] MD, [name]    . HTN (hypertension) 02/11/2011  . Pulmonary hypertension associated with end stage renal disease on dialysis (HCC) 04/16/2014  . Hyperkalemia   . Hypothyroidism   . Osteomyelitis [facility])     Past Surgical History  Procedure Laterality Date  . Dialysis fistula creation      left upper arm fistula  . Amputation      TOES ON BOTH FEET  . Abscess drainage  Behind right ear/occipital scalp  . Skin graft    . Eye surgery  1999  . Coronary artery bypass graft  10/2009    [facility] (Dr. Katrinka Blazing)  . Foot amputation    . Cardiac catheterization    . Cardiac catheterization  2/14    [facility]: severe 3 vessel CAD with patent grafts, RHC: moderately elevated PCW and pulmonary hypertension  . Penile prosthesis implant N/A 07/24/2014    Procedure: SALINE PENILE INJECTION WITH DISSECTION OF CORPORA ,  IMPLANTATION OF COLOPLAST PENILE PROTHESIS INFLATABLE;  Surgeon: [name], MD;  Location: [facility] ORS;  Service: Urology;  Laterality: N/A;  . Removal of penile  prosthesis N/A 08/22/2014    Procedure: EXPLANT OF INFECTED  PENILE PROSTHESIS;  Surgeon: [name], MD;  Location: [facility] ORS;  Service: Urology;  Laterality: N/A;  . Irrigation and debridement abscess Bilateral 12/14/2014    Procedure: Bilateral Corporal Irrigation with Cultures and Drainage, Penrose Drain Insertion;  Surgeon: [name], MD;  Location: [facility] OR;  Service: Urology;  Laterality: Bilateral;  . Scrotal exploration N/A 02/03/2015    Procedure: IRRIGATION AND DEBRIDEMENT SCROTAL/PENILE ABSCESS;  Surgeon: [name], MD;  Location: [facility] OR;  Service: Urology;  Laterality: N/A;  . Cystoscopy N/A 02/03/2015    Procedure: CYSTOSCOPY;  Surgeon: [name], MD;  Location: [facility] OR;  Service: Urology;  Laterality: N/A;  . Esophagogastroduodenoscopy N/A 03/18/2015    Procedure: ESOPHAGOGASTRODUODENOSCOPY (EGD);  Surgeon: [name], MD;  Location: [facility] ENDOSCOPY;  Service: Endoscopy;  Laterality: N/A;  . Cardiac catheterization N/A 05/27/2015    Procedure: Right Heart Cath;  Surgeon: [name], MD;  Location: [facility] INVASIVE CV LAB;  Service: Cardiovascular;  Laterality: N/A;    Family History  Problem Relation Age of Onset  . Heart disease Other   . Hypertension Other   . Hypertension Mother   . Heart disease Mother   . Diabetes Mother   . Birth defects Paternal Uncle     unaware  . Birth defects Paternal Grandmother     breast  . Kidney disease Neg Hx   . Prostate cancer Neg Hx     Social History:  reports that he has never smoked. He quit smokeless tobacco use about 20 years ago. His smokeless tobacco use included Chew. He reports that he does not drink alcohol or use illicit drugs.  Allergies  Allergen Reactions  . Amoxicillin-Pot Clavulanate Nausea And Vomiting and Itching  . 2nd Skin Quick Heal Other (See Comments)  . Rifampin Nausea And Vomiting  . Tape Other (See Comments)    Medications:  I have reviewed the patient's current  medications. Prior to Admission:  Prescriptions prior to admission  Medication Sig Dispense Refill Last Dose  . acetaminophen (TYLENOL) 500 MG tablet Take 1,000 mg by mouth every 6 (six) hours as needed for mild pain or headache.    PRN  . alum & mag hydroxide-simeth (MAALOX PLUS) 400-400-40 MG/5ML suspension Take 30 mLs by mouth every 4 (four) hours as needed for indigestion.  PRN  . amitriptyline (ELAVIL) 25 MG tablet Take 1 tablet (25 mg total) by mouth at bedtime. 30 tablet 1 01/06/2016 at Unknown time  . aspirin EC 81 MG tablet Take 81 mg by mouth daily.   01/06/2016 at 0830  . calcium-vitamin D (OSCAL WITH D) 250-125 MG-UNIT tablet Take 1 tablet by mouth daily.   01/06/2016 at Unknown time  . carvedilol (COREG) 25 MG tablet Take 1 tablet (25 mg total) by mouth 2 (two) times daily with a meal. 180 tablet 1 01/06/2016 at Unknown time  . cinacalcet (SENSIPAR) 30 MG tablet Take 30 mg by mouth daily.   01/06/2016 at Unknown time  . citalopram (CELEXA) 20 MG tablet Take 20 mg by mouth daily.   01/06/2016 at Unknown time  . cloNIDine (CATAPRES) 0.1 MG tablet Take 0.1 mg by mouth 2 (two) times daily.   01/06/2016 at Unknown time  . clopidogrel (PLAVIX) 75 MG tablet Take 1 tablet (75 mg total) by mouth daily. 90 tablet 3 01/06/2016 at 0830  . DHA-EPA-VITAMIN E PO Take 1 capsule by mouth daily.   01/06/2016 at Unknown time  . furosemide (LASIX) 80 MG tablet Take 80 mg by mouth 2 (two) times daily.   01/06/2016 at Unknown time  . ibuprofen (ADVIL,MOTRIN) 200 MG tablet Take 200 mg by mouth every 6 (six) hours as needed. Patient states he only takes 2 a day   PRN  . insulin aspart (NOVOLOG) 100 UNIT/ML injection Inject 0-7 Units into the skin 4 (four) times daily -  before meals and at bedtime. (Patient taking differently: Inject 4 Units into the skin 3 (three) times daily. If blood sugar is high pt. Will take up to 7 units if needed.) 10 mL 11 01/06/2016 at Unknown time  . insulin glargine (LANTUS) 100 UNIT/ML  injection Inject 14 Units into the skin at bedtime.   01/06/2016 at Unknown time  . isosorbide dinitrate (ISORDIL) 30 MG tablet Take 30 mg by mouth daily.   01/06/2016 at Unknown time  . levothyroxine (SYNTHROID, LEVOTHROID) 75 MCG tablet Take 1 tablet (75 mcg total) by mouth daily before breakfast. 90 tablet 1 01/06/2016 at Unknown time  . lisinopril (PRINIVIL,ZESTRIL) 10 MG tablet Take 10 mg by mouth daily.   01/06/2016 at Unknown time  . metolazone (ZAROXOLYN) 5 MG tablet Take 5 mg by mouth daily.   01/06/2016 at Unknown time  . metronidazole (FLAGYL) 375 MG capsule Take 375 mg by mouth 2 (two) times daily.   01/06/2016 at Unknown time  . nitroGLYCERIN (NITROSTAT) 0.4 MG SL tablet Place 0.4 mg under the tongue every 5 (five) minutes as needed for chest pain.   PRN  . Omega-3 Fatty Acids (FISH OIL) 1000 MG CAPS Take 1 capsule by mouth daily.   01/06/2016 at Unknown time  . omeprazole (PRILOSEC) 40 MG capsule Take 40 mg by mouth daily.   01/06/2016 at Unknown time  . OXYGEN Inhale into the lungs.   01/06/2016 at Unknown time  . pantoprazole (PROTONIX) 40 MG tablet Take 40 mg by mouth 2 (two) times daily.   01/06/2016 at Unknown time  . pregabalin (LYRICA) 75 MG capsule Take 75 mg by mouth 2 (two) times daily.    01/06/2016 at Unknown time  . PROAIR HFA 108 (90 Base) MCG/ACT inhaler Inhale 1-2 puffs into the lungs every 6 (six) hours as needed.   1 PRN  . Probiotic CAPS Take 1 capsule by mouth daily. 10 capsule 0 01/06/2016 at Unknown time  .  rosuvastatin (CRESTOR) 20 MG tablet Take 1 tablet (20 mg total) by mouth at bedtime. 30 tablet 5 01/06/2016 at Unknown time  . sevelamer carbonate (RENVELA) 800 MG tablet Take 800 mg by mouth 3 (three) times daily with meals.    01/06/2016 at Unknown time  . traZODone (DESYREL) 50 MG tablet Take 50 mg by mouth at bedtime.   01/06/2016 at Unknown time   Scheduled: . antiseptic oral rinse  7 mL Mouth Rinse q12n4p  . aspirin EC  81 mg Oral Daily  . calcium-vitamin D  1  tablet Oral Daily  . carbamide peroxide  2 drop Left Ear BID  . cephALEXin  500 mg Oral Q24H  . chlorhexidine  15 mL Mouth Rinse BID  . citalopram  20 mg Oral Daily  . clopidogrel  75 mg Oral Daily  . docusate sodium  100 mg Oral BID  . epoetin (EPOGEN/PROCRIT) injection  10,000 Units Intravenous Q M,W,F-HD  . feeding supplement (NEPRO CARB STEADY)  237 mL Oral Q24H  . heparin  5,000 Units Subcutaneous 3 times per day  . insulin aspart  1-3 Units Subcutaneous 6 times per day  . insulin glargine  8 Units Subcutaneous QHS  . levothyroxine  75 mcg Oral Q0600  . omega-3 acid ethyl esters  1 g Oral Daily  . pantoprazole  40 mg Oral Daily  . pregabalin  75 mg Oral BID  . rosuvastatin  20 mg Oral QHS  . sevelamer carbonate  800 mg Oral TID WC  . traZODone  50 mg Oral QHS    ROS: History obtained from the patient  General ROS:  fever Psychological ROS: negative for - behavioral disorder, hallucinations, memory difficulties, mood swings or suicidal ideation Ophthalmic ROS: negative for - blurry vision, double vision, eye pain or loss of vision ENT ROS: negative for - epistaxis, nasal discharge, oral lesions, sore throat, tinnitus or vertigo Allergy and Immunology ROS: negative for - hives or itchy/watery eyes Hematological and Lymphatic ROS: negative for - bleeding problems, bruising or swollen lymph nodes Endocrine ROS: negative for - galactorrhea, hair pattern changes, polydipsia/polyuria or temperature intolerance Respiratory ROS: shortness of breath  Cardiovascular ROS: negative for - chest pain, dyspnea on exertion, edema or irregular heartbeat Gastrointestinal ROS: negative for - abdominal pain, diarrhea, hematemesis, nausea/vomiting or stool incontinence Genito-Urinary ROS: negative for - dysuria, hematuria, incontinence or urinary frequency/urgency Musculoskeletal ROS: back pain Neurological ROS: as noted in HPI Dermatological ROS: negative for rash and skin lesion  changes  Physical Examination: Blood pressure 126/63, pulse 79, temperature 97.5 F (36.4 C), temperature source Oral, resp. rate 16, height  (1.803 m), weight 87.8 kg (193 lb 9 oz), SpO2 100 %.  HEENT-  Normocephalic, no lesions, without obvious abnormality.  Normal external eye and conjunctiva.  Normal TM's bilaterally.  Normal auditory canals and external ears. Normal external nose, mucus membranes and septum.  Normal pharynx. Cardiovascular- S1, S2 normal, pulses palpable throughout   Lungs- chest clear, no wheezing, rales, normal symmetric air entry Abdomen- distended Extremities- no edema Lymph-no adenopathy palpable Musculoskeletal-no joint tenderness, deformity or swelling Skin-warm and dry, no hyperpigmentation, vitiligo, or suspicious lesions  Neurological Examination Mental Status: Lethargic.  Very inconsistent with examination, thought content appropriate at times.  Speech fluent without evidence of aphasia.  Able to follow 3 step commands without difficulty when kept alert. Cranial Nerves: II: Discs flat bilaterally; Visual fields grossly normal, pupils equal, round, reactive to light and accommodation III,IV, VI: ptosis not present, extra-ocular  motions intact bilaterally V,VII: smile symmetric, facial light touch sensation normal bilaterally VIII: hearing normal bilaterally IX,X: gag reflex present XI: bilateral shoulder shrug XII: midline tongue extension Motor: Right : Upper extremity   5/5    Left:     Upper extremity   5/5  Lower extremity   0/5     Lower extremity   0/5 Lower extremities flaccid Sensory: Initially patient reported that he had decreased pinprick and light touch on the entire left side including the arm as compared to the right, improving at the umbilicus and decreased on the right to just below the nipple line, again improved above the umbilicus.  In mid exam patient then changed his mind and reported that everything was fine above the umbilicus  and that he was impaired below the umbilicus.   Deep Tendon Reflexes: 2+ in the upper extremities and absent in the lower extremities.   Plantars: BLE toe amputations  Cerebellar: Normal finger-to-nose testing.  Unable to perform heel-to-shin testing due to weakness Gait: not tested due to weakness   Laboratory Studies:   Basic Metabolic Panel:  Recent Labs Lab 01/08/16 1835 01/08/16 2125 01/09/16 0349 01/10/16 0529  NA 130*  --  135 133*  K 3.8  --  2.9* 3.7  CL 94*  --  98* 97*  CO2 18*  --  25 23  GLUCOSE 194*  --  118* 168*  BUN 66*  --  36* 43*  CREATININE 6.11*  --  4.03* 4.66*  CALCIUM 8.0*  --  7.9* 8.3*  PHOS  --  5.6*  --   --     Liver Function Tests: No results for input(s): AST, ALT, ALKPHOS, BILITOT, PROT, ALBUMIN in the last 168 hours. No results for input(s): LIPASE, AMYLASE in the last 168 hours. No results for input(s): AMMONIA in the last 168 hours.  CBC:  Recent Labs Lab 01/08/16 1835 01/09/16 0349 01/12/16 0740  WBC 24.6* 15.6* 10.7*  HGB 8.6* 9.4* 9.1*  HCT 27.3* 29.3* 28.8*  MCV 89.5 89.1 89.3  PLT 109* 110* 79*    Cardiac Enzymes: No results for input(s): CKTOTAL, CKMB, CKMBINDEX, TROPONINI in the last 168 hours.  BNP: Invalid input(s): POCBNP  CBG:  Recent Labs Lab 01/13/16 2006 01/13/16 2356 01/14/16 0434 01/14/16 0825 01/14/16 1121  GLUCAP 138* 132* 116* 93 104*    Microbiology: Results for orders placed or performed during the hospital encounter of 01/07/16  Blood Culture (routine x 2)     Status: None   Collection Time: 01/07/16  6:33 AM  Result Value Ref Range Status   Specimen Description BLOOD RTA  Final   Special Requests   Final    BOTTLES DRAWN AEROBIC AND ANAEROBIC AER ANA   Culture NO GROWTH 5 DAYS  Final   Report Status 01/12/2016 FINAL  Final  Blood Culture (routine x 2)     Status: None   Collection Time: 01/07/16  6:58 AM  Result Value Ref Range Status   Specimen Description BLOOD RIGHT ARM   Final   Special Requests   Final    BOTTLES DRAWN AEROBIC AND ANAEROBIC AER ANA   Culture NO GROWTH 5 DAYS  Final   Report Status 01/12/2016 FINAL  Final  Urine culture     Status: None   Collection Time: 01/07/16 10:45 AM  Result Value Ref Range Status   Specimen Description URINE, RANDOM  Final   Special Requests NONE  Final  Culture >=100,000 COLONIES/mL ESCHERICHIA COLI  Final   Report Status 01/10/2016 FINAL  Final   Organism ID, Bacteria ESCHERICHIA COLI  Final      Susceptibility   Escherichia coli - MIC*    AMPICILLIN >=32 RESISTANT Resistant     CEFAZOLIN <=4 SENSITIVE Sensitive     CEFTRIAXONE <=1 SENSITIVE Sensitive     CIPROFLOXACIN >=4 RESISTANT Resistant     GENTAMICIN <=1 SENSITIVE Sensitive     IMIPENEM <=0.25 SENSITIVE Sensitive     NITROFURANTOIN <=16 SENSITIVE Sensitive     TRIMETH/SULFA >=320 RESISTANT Resistant     AMPICILLIN/SULBACTAM >=32 RESISTANT Resistant     PIP/TAZO <=4 SENSITIVE Sensitive     Extended ESBL NEGATIVE Sensitive     * >=100,000 COLONIES/mL ESCHERICHIA COLI  MRSA PCR Screening     Status: None   Collection Time: 01/07/16 12:20 PM  Result Value Ref Range Status   MRSA by PCR NEGATIVE NEGATIVE Final    Comment:        The GeneXpert MRSA Assay (FDA approved for NASAL specimens only), is one component of a comprehensive MRSA colonization surveillance program. It is not intended to diagnose MRSA infection nor to guide or monitor treatment for MRSA infections.   C difficile quick scan w PCR reflex     Status: None   Collection Time: 01/11/16 10:57 AM  Result Value Ref Range Status   C Diff antigen NEGATIVE NEGATIVE Final   C Diff toxin NEGATIVE NEGATIVE Final   C Diff interpretation Negative for C. difficile  Final    Coagulation Studies: No results for input(s): LABPROT, INR in the last 72 hours.  Urinalysis: No results for input(s): COLORURINE, LABSPEC, PHURINE, GLUCOSEU, HGBUR, BILIRUBINUR, KETONESUR, PROTEINUR,  UROBILINOGEN, NITRITE, LEUKOCYTESUR in the last 168 hours.  Invalid input(s): APPERANCEUR  Lipid Panel:     Component Value Date/Time   CHOL 76 03/04/2015 0430   CHOL 93 04/09/2014 0900   TRIG 60 03/04/2015 0430   TRIG 86.0 04/09/2014 0900   HDL 37* 03/04/2015 0430   HDL 40.00 04/09/2014 0900   CHOLHDL 2 04/09/2014 0900   VLDL 12 03/04/2015 0430   VLDL 17.2 04/09/2014 0900   LDLCALC 27 03/04/2015 0430   LDLCALC 36 04/09/2014 0900    HgbA1C:  Lab Results  Component Value Date   HGBA1C 9.4* 03/04/2015    Urine Drug Screen:  No results found for: LABOPIA, COCAINSCRNUR, LABBENZ, AMPHETMU, THCU, LABBARB  Alcohol Level: No results for input(s): ETH in the last 168 hours.  Imaging: Mr Cervical Spine Wo Contrast  01/13/2016  CLINICAL DATA:  Numbness in both legs. EXAM: MRI CERVICAL SPINE WITHOUT CONTRAST TECHNIQUE: Multiplanar, multisequence MR imaging of the cervical spine was performed. No intravenous contrast was administered. COMPARISON:  MRI lumbar spine from the same day. MRI of the cervical spine 06/23/2015. FINDINGS: Normal signal is present in the cervical and upper thoracic spinal cord to the lowest imaged level, C7-T1. Chronic endplate marrow changes are again noted at C5-6 with slight retrolisthesis at this level. There is straightening of the normal cervical lordosis. Vertebral body heights alignment are otherwise normal. The left vertebral artery is occluded. The craniocervical junction is within normal limits. Flow is present in the right vertebral artery. C2-3:  Negative. C3-4:  Negative. C4-5: Mild uncovertebral spurring is present on the left without significant stenosis. C5-6: A broad-based disc osteophyte complex is present. This effaces the ventral CSF. Moderate central and bilateral foraminal stenosis is present, right greater than left. C6-7: A  left paramedian disc protrusion is present. Moderate left central canal and foraminal stenosis is similar to the prior study. The  right foramen is patent. C7-T1:  Negative. IMPRESSION: 1. Stable broad-based disc osteophyte complex with moderate central and right greater than left foraminal stenosis at C5-6. 2. Stable left paramedian disc protrusion with moderate left central and foraminal stenosis at C6-7. 3. Chronic occlusion of the left vertebral artery. Electronically Signed   By: Marin Roberts M.D.   On: 01/13/2016 15:14   Mr Thoracic Spine Wo Contrast  01/14/2016  CLINICAL DATA:  Numbness in both lower extremities. EXAM: MRI THORACIC SPINE WITHOUT CONTRAST TECHNIQUE: Multiplanar, multisequence MR imaging of the thoracic spine was performed. No intravenous contrast was administered. COMPARISON:  Multiple exams, including 02/20/2015 FINDINGS: Despite efforts by the technologist and patient, motion artifact is present on today's exam and could not be eliminated. This reduces exam sensitivity and specificity. Small to moderate bilateral pleural effusions are observed. We see down to the T11 level although T11 is distorted. The T12 level is mostly included on the prior lumbar spine MRI. No vertebral subluxation is observed. No significant abnormal spinal cord signal is observed. Subcutaneous edema noted along the patient's back. No vertebral edema noted. There is mild prominence of epidural adipose tissues in the thoracic spine dorsally. In the thoracic spine, no significant spondylosis or degenerative disc disease is observed. No central or foraminal impingement is identified. Again, T11 is distorted in the T12 level is not well seen due to boundary artifact. IMPRESSION: 1. No impingement in the thoracic spine is identified. Please note that the T12 level is not well seen in the T11 level is distorted due to boundary artifact. The T12 level was mostly included on the prior lumbar MRI. 2. Small to moderate bilateral pleural effusions. 3. Mild prominence of dorsal epidural adipose tissues. Electronically Signed   By: Gaylyn Rong  M.D.   On: 01/14/2016 10:54   Mr Lumbar Spine Wo Contrast  01/13/2016  CLINICAL DATA:  45 year old male with numbness in both lower extremities. Initial encounter. End-stage renal disease patient. EXAM: MRI LUMBAR SPINE WITHOUT CONTRAST TECHNIQUE: Multiplanar, multisequence MR imaging of the lumbar spine was performed. No intravenous contrast was administered. COMPARISON:  CT chest abdomen and pelvis 02/20/2015. FINDINGS: Suboptimal signal to noise on today images due to fast imaging protocol utilized secondary to severe patient pain during imaging. Normal lumbar segmentation demonstrated on the comparison. Lumbar vertebral height and alignment appears stable. No marrow edema or evidence of acute osseous abnormality. Generalized subcutaneous edema similar to the prior CT. Suggestion of generalized bilateral paraspinal muscle T2 and STIR hyperintensity. No intramuscular fluid collection identified. Native renal atrophy. Otherwise negative visualized abdominal viscera. Suboptimal visualization of the lower thoracic spinal cord and conus. The following superimposed degenerative changes are noted: T12-L1:  Negative. L1-L2:  Mild facet hypertrophy, otherwise negative. L2-L3:  Negative. L3-L4: Disc desiccation and mild disc space loss. Central and slightly caudal small to moderate size disc protrusion (series 100, image 8 and series 9, image 28. Mild epidural lipomatosis superimposed. Moderate to severe spinal stenosis at this level. No foraminal stenosis. L4-L5: Mild epidural lipomatosis. Negative disc. Mild facet hypertrophy. L5-S1: Epidural lipomatosis mostly effacing CSF from the thecal sac. Negative disc. No foraminal stenosis. IMPRESSION: 1. Isolated lumbar disc degeneration at L3-L4 where a central disc protrusion contributes to moderate to severe spinal stenosis when combined with mild epidural lipomatosis and facet hypertrophy. 2.  No acute osseous abnormality identified in the lumbar spine. 3.  Nonspecific  generalized lumbar erector spinae muscle edema. This can be seen in debilitated patients but or inflammatory myositis is not excluded. Superimposed generalized subcutaneous edema compatible with anasarca, and similar to the 2016 CT. Electronically Signed   By: Odessa Fleming M.D.   On: 01/13/2016 15:20     Assessment/Plan: 44 year old male with new onset BLE paraplegia.  MRI performed of the cervical, thoracic and lumbar spines.  Films reviewed with radiology.  Unclear areas of films are the areas of concern.  Although there does not appear to be any compressive abnormality that could be causing these symptoms, with episodes of hypotension and patient's extensive vascular disease, can not rule out a cord infarction.  Doubt GBS since without progressive symptoms.  No evidence of focal infection noted at this time.    Recommendations: 1.  After review of films with radiology, MRI of the thoracic spine to be repeated with special attention paid to the thoracolumbar area.  Will make further recommendations at that time.    Case discussed with Dr. Cherylann Banas, MD Neurology [phone] 01/14/2016, 3:07 PM

## 2016-01-15 LAB — CBC
HCT: 30.2 % — ABNORMAL LOW (ref 40.0–52.0)
Hemoglobin: 9.6 g/dL — ABNORMAL LOW (ref 13.0–18.0)
MCH: 28.2 pg (ref 26.0–34.0)
MCHC: 31.8 g/dL — ABNORMAL LOW (ref 32.0–36.0)
MCV: 88.6 fL (ref 80.0–100.0)
PLATELETS: 92 10*3/uL — AB (ref 150–440)
RBC: 3.41 MIL/uL — ABNORMAL LOW (ref 4.40–5.90)
RDW: 16.9 % — AB (ref 11.5–14.5)
WBC: 8 10*3/uL (ref 3.8–10.6)

## 2016-01-15 LAB — GLUCOSE, CAPILLARY
GLUCOSE-CAPILLARY: 172 mg/dL — AB (ref 65–99)
GLUCOSE-CAPILLARY: 158 mg/dL — AB (ref 65–99)
Glucose-Capillary: 152 mg/dL — ABNORMAL HIGH (ref 65–99)
Glucose-Capillary: 160 mg/dL — ABNORMAL HIGH (ref 65–99)
Glucose-Capillary: 63 mg/dL — ABNORMAL LOW (ref 65–99)
Glucose-Capillary: 83 mg/dL (ref 65–99)
Glucose-Capillary: 99 mg/dL (ref 65–99)

## 2016-01-15 MED ORDER — DEXTROSE 50 % IV SOLN
2.0000 | Freq: Once | INTRAVENOUS | Status: AC
  Administered 2016-01-15: 100 mL via INTRAVENOUS
  Filled 2016-01-15: qty 100

## 2016-01-15 MED ORDER — EPOETIN ALFA 10000 UNIT/ML IJ SOLN
10000.0000 [IU] | INTRAMUSCULAR | Status: DC
  Administered 2016-01-16: 10000 [IU] via SUBCUTANEOUS

## 2016-01-15 NOTE — Clinical Social Work Note (Signed)
Clinical Social Work Assessment  Patient Details  Name: Taylor [name] MRN: [id number] Date of Birth: 1971/08/16  Date of referral:  01/15/16               Reason for consult:  Facility Placement                Permission sought to share information with:  Family Supports, Oceanographer granted to share information::  Yes, Verbal Permission Granted  Name::        Agency::     Relationship::     Contact Information:     Housing/Transportation Living arrangements for the past 2 months:  Single Family Home Source of Information:  Patient, Parent Patient Interpreter Needed:  None Criminal Activity/Legal Involvement Pertinent to Current Situation/Hospitalization:  No - Comment as needed Significant Relationships:  Spouse Lives with:  Parents Do you feel safe going back to the place where you live?  Yes Need for family participation in patient care:  Yes (Comment)  Care giving concerns:  Patient lives at home with his parents. His dad is primary caregiver.   Social Worker assessment / plan:  CSW informed that PT recommended STR. Patient is open to rehab and bedsearch was initiated. Patient's father aware and sister in law, April, also aware. Patient has chosen to go to [facility]. CSW has informed [facility] CSW will facilitate discharge when time to [facility].  Employment status:  Disabled (Comment on whether or not currently receiving Disability) Insurance information:  Medicare PT Recommendations:  Skilled Nursing Facility Information / Referral to community resources:     Patient/Family's Response to care:  Patient expressed appreciation for CSW assistance.  Patient/Family's Understanding of and Emotional Response to Diagnosis, Current Treatment, and Prognosis:  Patient not enthusiastic about going to rehab but is cooperative and accepted a bed at [facility].  Emotional Assessment Appearance:  Appears older than stated  age Attitude/Demeanor/Rapport:   (cooperative) Affect (typically observed):  Accepting, Calm Orientation:  Oriented to Self, Oriented to Place, Oriented to Situation Alcohol / Substance use:  Not Applicable Psych involvement (Current and /or in the community):  No (Comment)  Discharge Needs  Concerns to be addressed:  Care Coordination Readmission within the last 30 days:  No Current discharge risk:  None Barriers to Discharge:  No Barriers Identified   York Spaniel, LCSW 01/15/2016, 3:37 PM

## 2016-01-15 NOTE — Progress Notes (Signed)
Nutrition Follow-up     INTERVENTION:  Meals and snacks: Cater to pt preferences Medical Nutrition Supplement Therapy: continue nepro for added nutrition   NUTRITION DIAGNOSIS:   Inadequate oral intake related to acute illness as evidenced by meal completion < 50%.    GOAL:   Patient will meet greater than or equal to 90% of their needs   MONITOR:   PO intake, Supplement acceptance, Diet advancement, Labs, Weight trends, I & O's  REASON FOR ASSESSMENT:   Diagnosis    ASSESSMENT:     Pt sleeping during visit this pm.     Current Nutrition: per I and O sheet limited documentation.    Gastrointestinal Profile: Last BM: 3/5   Scheduled Medications:  . antiseptic oral rinse  7 mL Mouth Rinse q12n4p  . aspirin EC  81 mg Oral Daily  . calcium-vitamin D  1 tablet Oral Daily  . carbamide peroxide  2 drop Left Ear BID  . cephALEXin  500 mg Oral Q24H  . chlorhexidine  15 mL Mouth Rinse BID  . citalopram  20 mg Oral Daily  . clopidogrel  75 mg Oral Daily  . docusate sodium  100 mg Oral BID  . [START ON 01/16/2016] epoetin (EPOGEN/PROCRIT) injection  10,000 Units Subcutaneous Q T,Th,Sa-HD  . feeding supplement (NEPRO CARB STEADY)  237 mL Oral Q24H  . insulin aspart  1-3 Units Subcutaneous 6 times per day  . insulin glargine  8 Units Subcutaneous QHS  . levothyroxine  75 mcg Oral Q0600  . omega-3 acid ethyl esters  1 g Oral Daily  . pantoprazole  40 mg Oral Daily  . pregabalin  75 mg Oral BID  . rosuvastatin  20 mg Oral QHS  . sevelamer carbonate  800 mg Oral TID WC  . traZODone  50 mg Oral QHS    Continuous Medications:  . sodium chloride 30 mL/hr at 01/14/16 0548     Electrolyte/Renal Profile and Glucose Profile:   Recent Labs Lab 01/08/16 2125 01/09/16 0349 01/10/16 0529 01/14/16 1605  NA  --  135 133* 132*  K  --  2.9* 3.7 4.2  CL  --  98* 97* 96*  CO2  --  25 23 25   BUN  --  36* 43* 34*  CREATININE  --  4.03* 4.66* 4.77*  CALCIUM  --  7.9*  8.3* 8.3*  PHOS 5.6*  --   --  5.2*  GLUCOSE  --  118* 168* 107*   Protein Profile:  Recent Labs Lab 01/14/16 1605  ALBUMIN 2.0*      Weight Trend since Admission: Filed Weights   01/12/16 1500 01/14/16 1530 01/14/16 1743  Weight: 193 lb 9 oz (87.8 kg) 202 lb 9.6 oz (91.9 kg) 197 lb 5 oz (89.5 kg)      Diet Order:  Diet NPO time specified  Skin:   reviewed     Height:   Ht Readings from Last 1 Encounters:  01/07/16 5\' 11"  (1.803 m)    Weight:   Wt Readings from Last 1 Encounters:  01/14/16 197 lb 5 oz (89.5 kg)    Ideal Body Weight:     BMI:  Body mass index is 27.53 kg/(m^2).  Estimated Nutritional Needs:   Kcal:  BEE: 1677kcals, TEE: (IF 1.1-1.3)(AF 1.2) 2213-1616kcals, using IBW of 78kg  Protein:  94-117g protein (1.2-1.5g/kg)   Fluid:  UOP+1L  EDUCATION NEEDS:   Education needs no appropriate at this time  MODERATE Care Level  Taylor [name].  Taylor [name], [place], [place] (pager) Weekend/On-Call pager [phone])

## 2016-01-15 NOTE — Progress Notes (Signed)
[facility]  ROUNDING NOTE   Subjective:   Agreed to two hours of dialysis yesterday. UF of  2.5 litres. Asking for dialysis today.   Refused lumbar puncture this morning however now states he is willing to do it.   His sensation in his lower extremities has returned.  Objective:  Vital signs in last 24 hours:  Temp:  [97 F (36.1 C)-97.8 F (36.6 C)] 97.6 F (36.4 C) (03/08 0500) Pulse Rate:  [77-85] 84 (03/08 0500) Resp:  [13-20] 18 (03/08 0500) BP: (115-135)/(62-70) 118/68 mmHg (03/08 0500) SpO2:  [99 %-100 %] 99 % (03/08 0500) Weight:  [89.5 kg (197 lb 5 oz)-91.9 kg (202 lb 9.6 oz)] 89.5 kg (197 lb 5 oz) (03/07 1743)  Weight change:  Filed Weights   01/12/16 1500 01/14/16 1530 01/14/16 1743  Weight: 87.8 kg (193 lb 9 oz) 91.9 kg (202 lb 9.6 oz) 89.5 kg (197 lb 5 oz)    Intake/Output: I/O last 3 completed shifts: In: 770.9 [I.V.:770.9] Out: 2590 [Urine:90; Other:2500]   Intake/Output this shift:  Total I/O In: 74 [I.V.:74] Out: -   Physical Exam: General: NAD, resting in bed  Head:  Moist oral mucosal membranes  Eyes: Anicteric,   Neck: Supple, trachea midline  Lungs:  Clear to auscultation normal effort  Heart: S1S2 no rubs, 2/6 murmur  Abdomen:  Soft, nontender, BS present, distended  Extremities: Anasarca++  Neurologic: Follows commands, not moving bilateral lower extremities.   Skin: Chronic venous statis changes  Access: LUE AVF    Basic Metabolic Panel:  Recent Labs Lab 01/08/16 1835 01/08/16 2125 01/09/16 0349 01/10/16 0529 01/14/16 1605  NA 130*  --  135 133* 132*  K 3.8  --  2.9* 3.7 4.2  CL 94*  --  98* 97* 96*  CO2 18*  --  25 23 25   GLUCOSE 194*  --  118* 168* 107*  BUN 66*  --  36* 43* 34*  CREATININE 6.11*  --  4.03* 4.66* 4.77*  CALCIUM 8.0*  --  7.9* 8.3* 8.3*  PHOS  --  5.6*  --   --  5.2*    Liver Function Tests:  Recent Labs Lab 01/14/16 1605  ALBUMIN 2.0*   No results for input(s): LIPASE, AMYLASE in  the last 168 hours. No results for input(s): AMMONIA in the last 168 hours.  CBC:  Recent Labs Lab 01/08/16 1835 01/09/16 0349 01/12/16 0740 01/14/16 1606 01/15/16 0525  WBC 24.6* 15.6* 10.7* 9.7 8.0  HGB 8.6* 9.4* 9.1* 9.1* 9.6*  HCT 27.3* 29.3* 28.8* 29.2* 30.2*  MCV 89.5 89.1 89.3 88.5 88.6  PLT 109* 110* 79* 94* 92*    Cardiac Enzymes: No results for input(s): CKTOTAL, CKMB, CKMBINDEX, TROPONINI in the last 168 hours.  BNP: Invalid input(s): POCBNP  CBG:  Recent Labs Lab 01/14/16 1121 01/14/16 2002 01/14/16 2350 01/15/16 0445 01/15/16 0740  GLUCAP 104* 133* 160* 99 83    Microbiology: Results for orders placed or performed during the hospital encounter of 01/07/16  Blood Culture (routine x 2)     Status: None   Collection Time: 01/07/16  6:33 AM  Result Value Ref Range Status   Specimen Description BLOOD RTA  Final   Special Requests   Final    BOTTLES DRAWN AEROBIC AND ANAEROBIC AER ANA   Culture NO GROWTH 5 DAYS  Final   Report Status 01/12/2016 FINAL  Final  Blood Culture (routine x 2)     Status: None  Collection Time: 01/07/16  6:58 AM  Result Value Ref Range Status   Specimen Description BLOOD RIGHT ARM  Final   Special Requests   Final    BOTTLES DRAWN AEROBIC AND ANAEROBIC AER ANA   Culture NO GROWTH 5 DAYS  Final   Report Status 01/12/2016 FINAL  Final  Urine culture     Status: None   Collection Time: 01/07/16 10:45 AM  Result Value Ref Range Status   Specimen Description URINE, RANDOM  Final   Special Requests NONE  Final   Culture >=100,000 COLONIES/mL ESCHERICHIA COLI  Final   Report Status 01/10/2016 FINAL  Final   Organism ID, Bacteria ESCHERICHIA COLI  Final      Susceptibility   Escherichia coli - MIC*    AMPICILLIN >=32 RESISTANT Resistant     CEFAZOLIN <=4 SENSITIVE Sensitive     CEFTRIAXONE <=1 SENSITIVE Sensitive     CIPROFLOXACIN >=4 RESISTANT Resistant     GENTAMICIN <=1 SENSITIVE Sensitive     IMIPENEM  <=0.25 SENSITIVE Sensitive     NITROFURANTOIN <=16 SENSITIVE Sensitive     TRIMETH/SULFA >=320 RESISTANT Resistant     AMPICILLIN/SULBACTAM >=32 RESISTANT Resistant     PIP/TAZO <=4 SENSITIVE Sensitive     Extended ESBL NEGATIVE Sensitive     * >=100,000 COLONIES/mL ESCHERICHIA COLI  MRSA PCR Screening     Status: None   Collection Time: 01/07/16 12:20 PM  Result Value Ref Range Status   MRSA by PCR NEGATIVE NEGATIVE Final    Comment:        The GeneXpert MRSA Assay (FDA approved for NASAL specimens only), is one component of a comprehensive MRSA colonization surveillance program. It is not intended to diagnose MRSA infection nor to guide or monitor treatment for MRSA infections.   C difficile quick scan w PCR reflex     Status: None   Collection Time: 01/11/16 10:57 AM  Result Value Ref Range Status   C Diff antigen NEGATIVE NEGATIVE Final   C Diff toxin NEGATIVE NEGATIVE Final   C Diff interpretation Negative for C. difficile  Final    Coagulation Studies: No results for input(s): LABPROT, INR in the last 72 hours.  Urinalysis: No results for input(s): COLORURINE, LABSPEC, PHURINE, GLUCOSEU, HGBUR, BILIRUBINUR, KETONESUR, PROTEINUR, UROBILINOGEN, NITRITE, LEUKOCYTESUR in the last 72 hours.  Invalid input(s): APPERANCEUR    Imaging: Mr Cervical Spine Wo Contrast  01/13/2016  CLINICAL DATA:  Numbness in both legs. EXAM: MRI CERVICAL SPINE WITHOUT CONTRAST TECHNIQUE: Multiplanar, multisequence MR imaging of the cervical spine was performed. No intravenous contrast was administered. COMPARISON:  MRI lumbar spine from the same day. MRI of the cervical spine 06/23/2015. FINDINGS: Normal signal is present in the cervical and upper thoracic spinal cord to the lowest imaged level, C7-T1. Chronic endplate marrow changes are again noted at C5-6 with slight retrolisthesis at this level. There is straightening of the normal cervical lordosis. Vertebral body heights alignment are  otherwise normal. The left vertebral artery is occluded. The craniocervical junction is within normal limits. Flow is present in the right vertebral artery. C2-3:  Negative. C3-4:  Negative. C4-5: Mild uncovertebral spurring is present on the left without significant stenosis. C5-6: A broad-based disc osteophyte complex is present. This effaces the ventral CSF. Moderate central and bilateral foraminal stenosis is present, right greater than left. C6-7: A left paramedian disc protrusion is present. Moderate left central canal and foraminal stenosis is similar to the prior study. The right foramen is  patent. C7-T1:  Negative. IMPRESSION: 1. Stable broad-based disc osteophyte complex with moderate central and right greater than left foraminal stenosis at C5-6. 2. Stable left paramedian disc protrusion with moderate left central and foraminal stenosis at C6-7. 3. Chronic occlusion of the left vertebral artery. Electronically Signed   By: Marin Roberts M.D.   On: 01/13/2016 15:14   Mr Thoracic Spine Wo Contrast  01/14/2016  ADDENDUM REPORT: 01/14/2016 15:34 ADDENDUM: The original report was by Dr. Gaylyn Rong. The following addendum is by Dr. Gaylyn Rong: Due to the specific clinical concern regarding the thoracolumbar junction and the suboptimal visualization of the thoracolumbar junction on the thoracic and lumbar spine MRI exams, we have the patient return to perform specific assessment of the thoracolumbar junction. This includes vertebral levels from T9 through L1. The cord in this vicinity demonstrates no abnormal edema on inversion recovery or T2 weighted images to suggest cord infarct the cord lesion. No impingement on the lower thoracic cord in this region is identified. Incidental note is made of bilateral perirenal stranding. Electronically Signed   By: Gaylyn Rong M.D.   On: 01/14/2016 15:34  01/14/2016  CLINICAL DATA:  Numbness in both lower extremities. EXAM: MRI THORACIC SPINE  WITHOUT CONTRAST TECHNIQUE: Multiplanar, multisequence MR imaging of the thoracic spine was performed. No intravenous contrast was administered. COMPARISON:  Multiple exams, including 02/20/2015 FINDINGS: Despite efforts by the technologist and patient, motion artifact is present on today's exam and could not be eliminated. This reduces exam sensitivity and specificity. Small to moderate bilateral pleural effusions are observed. We see down to the T11 level although T11 is distorted. The T12 level is mostly included on the prior lumbar spine MRI. No vertebral subluxation is observed. No significant abnormal spinal cord signal is observed. Subcutaneous edema noted along the patient's back. No vertebral edema noted. There is mild prominence of epidural adipose tissues in the thoracic spine dorsally. In the thoracic spine, no significant spondylosis or degenerative disc disease is observed. No central or foraminal impingement is identified. Again, T11 is distorted in the T12 level is not well seen due to boundary artifact. IMPRESSION: 1. No impingement in the thoracic spine is identified. Please note that the T12 level is not well seen in the T11 level is distorted due to boundary artifact. The T12 level was mostly included on the prior lumbar MRI. 2. Small to moderate bilateral pleural effusions. 3. Mild prominence of dorsal epidural adipose tissues. Electronically Signed: By: Gaylyn Rong M.D. On: 01/14/2016 10:54   Mr Lumbar Spine Wo Contrast  01/13/2016  CLINICAL DATA:  45 year old male with numbness in both lower extremities. Initial encounter. End-stage renal disease patient. EXAM: MRI LUMBAR SPINE WITHOUT CONTRAST TECHNIQUE: Multiplanar, multisequence MR imaging of the lumbar spine was performed. No intravenous contrast was administered. COMPARISON:  CT chest abdomen and pelvis 02/20/2015. FINDINGS: Suboptimal signal to noise on today images due to fast imaging protocol utilized secondary to severe  patient pain during imaging. Normal lumbar segmentation demonstrated on the comparison. Lumbar vertebral height and alignment appears stable. No marrow edema or evidence of acute osseous abnormality. Generalized subcutaneous edema similar to the prior CT. Suggestion of generalized bilateral paraspinal muscle T2 and STIR hyperintensity. No intramuscular fluid collection identified. Native renal atrophy. Otherwise negative visualized abdominal viscera. Suboptimal visualization of the lower thoracic spinal cord and conus. The following superimposed degenerative changes are noted: T12-L1:  Negative. L1-L2:  Mild facet hypertrophy, otherwise negative. L2-L3:  Negative. L3-L4: Disc desiccation and mild disc  space loss. Central and slightly caudal small to moderate size disc protrusion (series 100, image 8 and series 9, image 28. Mild epidural lipomatosis superimposed. Moderate to severe spinal stenosis at this level. No foraminal stenosis. L4-L5: Mild epidural lipomatosis. Negative disc. Mild facet hypertrophy. L5-S1: Epidural lipomatosis mostly effacing CSF from the thecal sac. Negative disc. No foraminal stenosis. IMPRESSION: 1. Isolated lumbar disc degeneration at L3-L4 where a central disc protrusion contributes to moderate to severe spinal stenosis when combined with mild epidural lipomatosis and facet hypertrophy. 2.  No acute osseous abnormality identified in the lumbar spine. 3. Nonspecific generalized lumbar erector spinae muscle edema. This can be seen in debilitated patients but or inflammatory myositis is not excluded. Superimposed generalized subcutaneous edema compatible with anasarca, and similar to the 2016 CT. Electronically Signed   By: Odessa Fleming M.D.   On: 01/13/2016 15:20     Medications:   . sodium chloride 30 mL/hr at 01/14/16 0548   . antiseptic oral rinse  7 mL Mouth Rinse q12n4p  . aspirin EC  81 mg Oral Daily  . calcium-vitamin D  1 tablet Oral Daily  . carbamide peroxide  2 drop Left  Ear BID  . cephALEXin  500 mg Oral Q24H  . chlorhexidine  15 mL Mouth Rinse BID  . citalopram  20 mg Oral Daily  . clopidogrel  75 mg Oral Daily  . docusate sodium  100 mg Oral BID  . epoetin (EPOGEN/PROCRIT) injection  10,000 Units Intravenous Q M,W,F-HD  . feeding supplement (NEPRO CARB STEADY)  237 mL Oral Q24H  . insulin aspart  1-3 Units Subcutaneous 6 times per day  . insulin glargine  8 Units Subcutaneous QHS  . levothyroxine  75 mcg Oral Q0600  . omega-3 acid ethyl esters  1 g Oral Daily  . pantoprazole  40 mg Oral Daily  . pregabalin  75 mg Oral BID  . rosuvastatin  20 mg Oral QHS  . sevelamer carbonate  800 mg Oral TID WC  . traZODone  50 mg Oral QHS   acetaminophen **OR** acetaminophen, HYDROcodone-acetaminophen, midodrine, morphine injection, naLOXone (NARCAN)  injection, ondansetron **OR** ondansetron (ZOFRAN) IV  Assessment/ Plan:  45 y.o. 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 TTS  1.  ESRD on HD TTHS:  Last treatment on Yesterday, only received 2 hours but did get 2.5 litres off on ultrafiltation.  - Next treatment for Thursday.   2. Urinary tract infection with sepsis: e. Coli on urine culture.  - .  Septic shock/UTI: cephalexain.   3.  Anemia of CKD:  hgb 9.6 - epogen with HD.  4.  Secondary Hyperparathyroidism: PTH low at 104 on 12/24/15. Phos on admission elevated at 5.6  - hold cinacalcet - continue sevelamer for binding.    Overall prognosis is poor. Consult Palliative care. Pending.    LOS: 8 Paige Vanderwoude 3/8/201710:30 AM

## 2016-01-15 NOTE — Progress Notes (Signed)
Subjective: Patient reports that his sensation has improved but he remains unable to move his legs.  Now reports that he does not want LP.    Objective: Current vital signs: BP 118/68 mmHg  Pulse 84  Temp(Src) 97.6 F (36.4 C) (Oral)  Resp 18  Ht 5\' 11"  (1.803 m)  Wt 89.5 kg (197 lb 5 oz)  BMI 27.53 kg/m2  SpO2 99% Vital signs in last 24 hours: Temp:  [97 F (36.1 C)-97.8 F (36.6 C)] 97.6 F (36.4 C) (03/08 0500) Pulse Rate:  [77-85] 84 (03/08 0500) Resp:  [13-20] 18 (03/08 0500) BP: (115-135)/(62-70) 118/68 mmHg (03/08 0500) SpO2:  [99 %-100 %] 99 % (03/08 0500) Weight:  [89.5 kg (197 lb 5 oz)-91.9 kg (202 lb 9.6 oz)] 89.5 kg (197 lb 5 oz) (03/07 1743)  Intake/Output from previous day: 03/07 0701 - 03/08 0700 In: 455.9 [I.V.:455.9] Out: 2570 [Urine:70] Intake/Output this shift: Total I/O In: 74 [I.V.:74] Out: -  Nutritional status: Diet renal with fluid restriction Fluid restriction:: 1200 mL Fluid; Room service appropriate?: Yes; Fluid consistency:: Thin  Neurologic Exam: Mental Status: Lethargic. Speech fluent without evidence of aphasia but slurred. Able to follow 3 step commands without difficulty when kept alert. Cranial Nerves: II: Discs flat bilaterally; Visual fields grossly normal, pupils equal, round, reactive to light and accommodation III,IV, VI: ptosis not present, extra-ocular motions intact bilaterally V,VII: smile symmetric, facial light touch sensation normal bilaterally VIII: hearing normal bilaterally IX,X: gag reflex present XI: bilateral shoulder shrug XII: midline tongue extension Motor: Right :Upper extremity 5/5Left: Upper extremity 5/5 Lower extremity 0/5Lower extremity 0/5 Lower extremities flaccid Sensory: Decreased pinprick and light touch below the knees bilaterally.    Deep Tendon Reflexes: 2+ in the upper extremities  and absent in the lower extremities.  Plantars: BLE toe amputations    Lab Results: Basic Metabolic Panel:  Recent Labs Lab 01/08/16 1835 01/08/16 2125 01/09/16 0349 01/10/16 0529 01/14/16 1605  NA 130*  --  135 133* 132*  K 3.8  --  2.9* 3.7 4.2  CL 94*  --  98* 97* 96*  CO2 18*  --  25 23 25   GLUCOSE 194*  --  118* 168* 107*  BUN 66*  --  36* 43* 34*  CREATININE 6.11*  --  4.03* 4.66* 4.77*  CALCIUM 8.0*  --  7.9* 8.3* 8.3*  PHOS  --  5.6*  --   --  5.2*    Liver Function Tests:  Recent Labs Lab 01/14/16 1605  ALBUMIN 2.0*   No results for input(s): LIPASE, AMYLASE in the last 168 hours. No results for input(s): AMMONIA in the last 168 hours.  CBC:  Recent Labs Lab 01/08/16 1835 01/09/16 0349 01/12/16 0740 01/14/16 1606 01/15/16 0525  WBC 24.6* 15.6* 10.7* 9.7 8.0  HGB 8.6* 9.4* 9.1* 9.1* 9.6*  HCT 27.3* 29.3* 28.8* 29.2* 30.2*  MCV 89.5 89.1 89.3 88.5 88.6  PLT 109* 110* 79* 94* 92*    Cardiac Enzymes: No results for input(s): CKTOTAL, CKMB, CKMBINDEX, TROPONINI in the last 168 hours.  Lipid Panel: No results for input(s): CHOL, TRIG, HDL, CHOLHDL, VLDL, LDLCALC in the last 168 hours.  CBG:  Recent Labs Lab 01/14/16 1121 01/14/16 2002 01/14/16 2350 01/15/16 0445 01/15/16 0740  GLUCAP 104* 133* 160* 99 83    Microbiology: Results for orders placed or performed during the hospital encounter of 01/07/16  Blood Culture (routine x 2)     Status: None   Collection Time: 01/07/16  6:33 AM  Result Value Ref Range Status   Specimen Description BLOOD RTA  Final   Special Requests   Final    BOTTLES DRAWN AEROBIC AND ANAEROBIC AER ANA   Culture NO GROWTH 5 DAYS  Final   Report Status 01/12/2016 FINAL  Final  Blood Culture (routine x 2)     Status: None   Collection Time: 01/07/16  6:58 AM  Result Value Ref Range Status   Specimen Description BLOOD RIGHT ARM  Final   Special Requests   Final    BOTTLES DRAWN AEROBIC AND ANAEROBIC  AER ANA   Culture NO GROWTH 5 DAYS  Final   Report Status 01/12/2016 FINAL  Final  Urine culture     Status: None   Collection Time: 01/07/16 10:45 AM  Result Value Ref Range Status   Specimen Description URINE, RANDOM  Final   Special Requests NONE  Final   Culture >=100,000 COLONIES/mL ESCHERICHIA COLI  Final   Report Status 01/10/2016 FINAL  Final   Organism ID, Bacteria ESCHERICHIA COLI  Final      Susceptibility   Escherichia coli - MIC*    AMPICILLIN >=32 RESISTANT Resistant     CEFAZOLIN <=4 SENSITIVE Sensitive     CEFTRIAXONE <=1 SENSITIVE Sensitive     CIPROFLOXACIN >=4 RESISTANT Resistant     GENTAMICIN <=1 SENSITIVE Sensitive     IMIPENEM <=0.25 SENSITIVE Sensitive     NITROFURANTOIN <=16 SENSITIVE Sensitive     TRIMETH/SULFA >=320 RESISTANT Resistant     AMPICILLIN/SULBACTAM >=32 RESISTANT Resistant     PIP/TAZO <=4 SENSITIVE Sensitive     Extended ESBL NEGATIVE Sensitive     * >=100,000 COLONIES/mL ESCHERICHIA COLI  MRSA PCR Screening     Status: None   Collection Time: 01/07/16 12:20 PM  Result Value Ref Range Status   MRSA by PCR NEGATIVE NEGATIVE Final    Comment:        The GeneXpert MRSA Assay (FDA approved for NASAL specimens only), is one component of a comprehensive MRSA colonization surveillance program. It is not intended to diagnose MRSA infection nor to guide or monitor treatment for MRSA infections.   C difficile quick scan w PCR reflex     Status: None   Collection Time: 01/11/16 10:57 AM  Result Value Ref Range Status   C Diff antigen NEGATIVE NEGATIVE Final   C Diff toxin NEGATIVE NEGATIVE Final   C Diff interpretation Negative for C. difficile  Final    Coagulation Studies: No results for input(s): LABPROT, INR in the last 72 hours.  Imaging: Mr Cervical Spine Wo Contrast  01/13/2016  CLINICAL DATA:  Numbness in both legs. EXAM: MRI CERVICAL SPINE WITHOUT CONTRAST TECHNIQUE: Multiplanar, multisequence MR imaging of the cervical  spine was performed. No intravenous contrast was administered. COMPARISON:  MRI lumbar spine from the same day. MRI of the cervical spine 06/23/2015. FINDINGS: Normal signal is present in the cervical and upper thoracic spinal cord to the lowest imaged level, C7-T1. Chronic endplate marrow changes are again noted at C5-6 with slight retrolisthesis at this level. There is straightening of the normal cervical lordosis. Vertebral body heights alignment are otherwise normal. The left vertebral artery is occluded. The craniocervical junction is within normal limits. Flow is present in the right vertebral artery. C2-3:  Negative. C3-4:  Negative. C4-5: Mild uncovertebral spurring is present on the left without significant stenosis. C5-6: A broad-based disc osteophyte complex is present. This effaces the ventral  CSF. Moderate central and bilateral foraminal stenosis is present, right greater than left. C6-7: A left paramedian disc protrusion is present. Moderate left central canal and foraminal stenosis is similar to the prior study. The right foramen is patent. C7-T1:  Negative. IMPRESSION: 1. Stable broad-based disc osteophyte complex with moderate central and right greater than left foraminal stenosis at C5-6. 2. Stable left paramedian disc protrusion with moderate left central and foraminal stenosis at C6-7. 3. Chronic occlusion of the left vertebral artery. Electronically Signed   By: Marin Roberts M.D.   On: 01/13/2016 15:14   Mr Thoracic Spine Wo Contrast  01/14/2016  ADDENDUM REPORT: 01/14/2016 15:34 ADDENDUM: The original report was by Dr. Gaylyn Rong. The following addendum is by Dr. Gaylyn Rong: Due to the specific clinical concern regarding the thoracolumbar junction and the suboptimal visualization of the thoracolumbar junction on the thoracic and lumbar spine MRI exams, we have the patient return to perform specific assessment of the thoracolumbar junction. This includes vertebral levels  from T9 through L1. The cord in this vicinity demonstrates no abnormal edema on inversion recovery or T2 weighted images to suggest cord infarct the cord lesion. No impingement on the lower thoracic cord in this region is identified. Incidental note is made of bilateral perirenal stranding. Electronically Signed   By: Gaylyn Rong M.D.   On: 01/14/2016 15:34  01/14/2016  CLINICAL DATA:  Numbness in both lower extremities. EXAM: MRI THORACIC SPINE WITHOUT CONTRAST TECHNIQUE: Multiplanar, multisequence MR imaging of the thoracic spine was performed. No intravenous contrast was administered. COMPARISON:  Multiple exams, including 02/20/2015 FINDINGS: Despite efforts by the technologist and patient, motion artifact is present on today's exam and could not be eliminated. This reduces exam sensitivity and specificity. Small to moderate bilateral pleural effusions are observed. We see down to the T11 level although T11 is distorted. The T12 level is mostly included on the prior lumbar spine MRI. No vertebral subluxation is observed. No significant abnormal spinal cord signal is observed. Subcutaneous edema noted along the patient's back. No vertebral edema noted. There is mild prominence of epidural adipose tissues in the thoracic spine dorsally. In the thoracic spine, no significant spondylosis or degenerative disc disease is observed. No central or foraminal impingement is identified. Again, T11 is distorted in the T12 level is not well seen due to boundary artifact. IMPRESSION: 1. No impingement in the thoracic spine is identified. Please note that the T12 level is not well seen in the T11 level is distorted due to boundary artifact. The T12 level was mostly included on the prior lumbar MRI. 2. Small to moderate bilateral pleural effusions. 3. Mild prominence of dorsal epidural adipose tissues. Electronically Signed: By: Gaylyn Rong M.D. On: 01/14/2016 10:54   Mr Lumbar Spine Wo Contrast  01/13/2016   CLINICAL DATA:  45 year old male with numbness in both lower extremities. Initial encounter. End-stage renal disease patient. EXAM: MRI LUMBAR SPINE WITHOUT CONTRAST TECHNIQUE: Multiplanar, multisequence MR imaging of the lumbar spine was performed. No intravenous contrast was administered. COMPARISON:  CT chest abdomen and pelvis 02/20/2015. FINDINGS: Suboptimal signal to noise on today images due to fast imaging protocol utilized secondary to severe patient pain during imaging. Normal lumbar segmentation demonstrated on the comparison. Lumbar vertebral height and alignment appears stable. No marrow edema or evidence of acute osseous abnormality. Generalized subcutaneous edema similar to the prior CT. Suggestion of generalized bilateral paraspinal muscle T2 and STIR hyperintensity. No intramuscular fluid collection identified. Native renal atrophy. Otherwise negative visualized  abdominal viscera. Suboptimal visualization of the lower thoracic spinal cord and conus. The following superimposed degenerative changes are noted: T12-L1:  Negative. L1-L2:  Mild facet hypertrophy, otherwise negative. L2-L3:  Negative. L3-L4: Disc desiccation and mild disc space loss. Central and slightly caudal small to moderate size disc protrusion (series 100, image 8 and series 9, image 28. Mild epidural lipomatosis superimposed. Moderate to severe spinal stenosis at this level. No foraminal stenosis. L4-L5: Mild epidural lipomatosis. Negative disc. Mild facet hypertrophy. L5-S1: Epidural lipomatosis mostly effacing CSF from the thecal sac. Negative disc. No foraminal stenosis. IMPRESSION: 1. Isolated lumbar disc degeneration at L3-L4 where a central disc protrusion contributes to moderate to severe spinal stenosis when combined with mild epidural lipomatosis and facet hypertrophy. 2.  No acute osseous abnormality identified in the lumbar spine. 3. Nonspecific generalized lumbar erector spinae muscle edema. This can be seen in  debilitated patients but or inflammatory myositis is not excluded. Superimposed generalized subcutaneous edema compatible with anasarca, and similar to the 2016 CT. Electronically Signed   By: Odessa Fleming M.D.   On: 01/13/2016 15:20    Medications:  I have reviewed the patient's current medications. Scheduled: . antiseptic oral rinse  7 mL Mouth Rinse q12n4p  . aspirin EC  81 mg Oral Daily  . calcium-vitamin D  1 tablet Oral Daily  . carbamide peroxide  2 drop Left Ear BID  . cephALEXin  500 mg Oral Q24H  . chlorhexidine  15 mL Mouth Rinse BID  . citalopram  20 mg Oral Daily  . clopidogrel  75 mg Oral Daily  . docusate sodium  100 mg Oral BID  . epoetin (EPOGEN/PROCRIT) injection  10,000 Units Intravenous Q M,W,F-HD  . feeding supplement (NEPRO CARB STEADY)  237 mL Oral Q24H  . insulin aspart  1-3 Units Subcutaneous 6 times per day  . insulin glargine  8 Units Subcutaneous QHS  . levothyroxine  75 mcg Oral Q0600  . omega-3 acid ethyl esters  1 g Oral Daily  . pantoprazole  40 mg Oral Daily  . pregabalin  75 mg Oral BID  . rosuvastatin  20 mg Oral QHS  . sevelamer carbonate  800 mg Oral TID WC  . traZODone  50 mg Oral QHS    Assessment/Plan: Patient with improved sensation but continued lower extremity flaccid paralysis.  All MRI's of cervical, thoracic and lumbar spine reviewed.  No evidence of cord abnormality noted.  No surgical lesion identified.  Patient not agreeable to LP at this time.  Will continue to follow clinically for further improvement.  Will likely benefit from repeat MRI of the thoracic spine in 2-3 days.  Ischemic cord lesions may at times not show up on initial imaging.  Patient on ASA and Plavix.     LOS: 8 days   Thana Farr, MD Neurology [phone] 01/15/2016  10:08 AM
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
## 2016-01-15 NOTE — Progress Notes (Signed)
Spoke to Dr. Luberta Mutter regarding lumbar aspiration. Plavix must be held for 5 days prior to LP. Plavix d/c'd for right now. Insulin dose has also been held due to patient's hypoglycemic episode today. Will continue to assess.

## 2016-01-15 NOTE — Progress Notes (Signed)
[facility] - [place] at [facility]   PATIENT NAME: Taylor [name]    MR#:  [id number]  DATE OF BIRTH:  09/14/1971  SUBJECTIVE;still feels weak in legs.no other symptoms.now agreeable  For LP<  CHIEF COMPLAINT:   Chief Complaint  Patient presents with  . Headache     Came septic, hypotensive on levophed. Have Ch suprapubic catheter and foley   BP is stable now.  does not have any diarrhea.     REVIEW OF SYSTEMS:  CONSTITUTIONAL: weakness in both legs. EYES: No blurred or double vision.  EARS, NOSE, AND THROAT: No tinnitus or ear pain. But very hard of hearing. RESPIRATORY: No cough, shortness of breath, wheezing or hemoptysis.  CARDIOVASCULAR: No chest pain, orthopnea, edema.  GASTROINTESTINAL: No nausea, vomiting, diarrhea or abdominal pain.  GENITOURINARY: No dysuria, hematuria.  ENDOCRINE: No polyuria, nocturia,  HEMATOLOGY: No anemia, easy bruising or bleeding SKIN: No rash or lesion. MUSCULOSKELETAL:c/o back pain  NEUROLOGIC:bilateral leg weakness.unalble to  Move legs,no slurred speech.no tingling numbness in legs. PSYCHIATRY; has  anxiety, depression.   ROS  DRUG ALLERGIES:   Allergies  Allergen Reactions  . Amoxicillin-Pot Clavulanate Nausea And Vomiting and Itching  . 2nd Skin Quick Heal Other (See Comments)  . Rifampin Nausea And Vomiting  . Tape Other (See Comments)    VITALS:  Blood pressure 132/67, pulse 83, temperature 97.5 F (36.4 C), temperature source Oral, resp. rate 19, height 5\' 11"  (1.803 m), weight 89.5 kg (197 lb 5 oz), SpO2 98 %.  PHYSICAL EXAMINATION:   GENERAL: 45 y.o.-year-old patient lying in the bed with no acute distress. Appears stable.  EYES: Pupils equal, round, reactive to light and accommodation. No scleral icterus. Extraocular muscles intact. Pallor HEENT: Head atraumatic, normocephalic. Oropharynx and nasopharynx clear.  NECK: Supple, no jugular venous distention. No thyroid enlargement, no  tenderness.  LUNGS: Normal breath sounds bilaterally, no wheezing, rales,rhonchi or crepitation. No use of accessory muscles of respiration.  CARDIOVASCULAR: S1, S2 normal. No murmurs, rubs, or gallops.  ABDOMEN: Soft, nontender, distended. Bowel sounds present. No organomegaly or mass. supra pubic catheter present. EXTREMITIES: ++pedal edema, nocyanosis, or clubbing.  NEUROLOGIC: Cranial nerves II through XII are intact.. Unable move legs bilaterally,power 2/5 bilaterally PSYCHIATRIC: The patient is alert and oriented x 3.  SKIN: No obvious rash, lesion, or ulcer.  Patient has chronic skin changes in the lower extremity with venous stasis changes.   Physical Exam LABORATORY PANEL:   CBC  Recent Labs Lab 01/15/16 0525  WBC 8.0  HGB 9.6*  HCT 30.2*  PLT 92*   ------------------------------------------------------------------------------------------------------------------  Chemistries   Recent Labs Lab 01/14/16 1605  NA 132*  K 4.2  CL 96*  CO2 25  GLUCOSE 107*  BUN 34*  CREATININE 4.77*  CALCIUM 8.3*   ------------------------------------------------------------------------------------------------------------------  Cardiac Enzymes No results for input(s): TROPONINI in the last 168 hours. ------------------------------------------------------------------------------------------------------------------  RADIOLOGY:  Mr Thoracic Spine Wo Contrast  01/14/2016  ADDENDUM REPORT: 01/14/2016 15:34 ADDENDUM: The original report was by Dr. 03/15/2016. The following addendum is by Dr. Gaylyn Rong: Due to the specific clinical concern regarding the thoracolumbar junction and the suboptimal visualization of the thoracolumbar junction on the thoracic and lumbar spine MRI exams, we have the patient return to perform specific assessment of the thoracolumbar junction. This includes vertebral levels from T9 through L1. The cord in this vicinity demonstrates no  abnormal edema on inversion recovery or T2 weighted images to suggest cord infarct the cord lesion. No impingement  on the lower thoracic cord in this region is identified. Incidental note is made of bilateral perirenal stranding. Electronically Signed   By: Gaylyn Rong M.D.   On: 01/14/2016 15:34  01/14/2016  CLINICAL DATA:  Numbness in both lower extremities. EXAM: MRI THORACIC SPINE WITHOUT CONTRAST TECHNIQUE: Multiplanar, multisequence MR imaging of the thoracic spine was performed. No intravenous contrast was administered. COMPARISON:  Multiple exams, including 02/20/2015 FINDINGS: Despite efforts by the technologist and patient, motion artifact is present on today's exam and could not be eliminated. This reduces exam sensitivity and specificity. Small to moderate bilateral pleural effusions are observed. We see down to the T11 level although T11 is distorted. The T12 level is mostly included on the prior lumbar spine MRI. No vertebral subluxation is observed. No significant abnormal spinal cord signal is observed. Subcutaneous edema noted along the patient's back. No vertebral edema noted. There is mild prominence of epidural adipose tissues in the thoracic spine dorsally. In the thoracic spine, no significant spondylosis or degenerative disc disease is observed. No central or foraminal impingement is identified. Again, T11 is distorted in the T12 level is not well seen due to boundary artifact. IMPRESSION: 1. No impingement in the thoracic spine is identified. Please note that the T12 level is not well seen in the T11 level is distorted due to boundary artifact. The T12 level was mostly included on the prior lumbar MRI. 2. Small to moderate bilateral pleural effusions. 3. Mild prominence of dorsal epidural adipose tissues. Electronically Signed: By: Gaylyn Rong M.D. On: 01/14/2016 10:54    ASSESSMENT AND PLAN:   Active Problems:   Sepsis (HCC)  1. Sepsis/septic shock - source urine given  pyuria - UTI -Urinalysis positive   Off the presors.BP  stable so we will discontinue IV hydration today. On admission given  vancomycin, levofloxacin and aztreonam On by mouth Keflex for Escherichia coli UTI.. -  2. ESRD on hemodialysis -Nephrology consultation for in-house dialysis -  3. 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 Thea Silversmith from [facility] urology to see patient for suprapubic catheter functioning -He recommends to get bladder scan done to see patient has urinary retention  4. Hypotension start with Coreg today. Blood pressure is still kind of borderline so I would not restart Imdur and lisinopril yet. 5. Headaches with sinus pain with history of sinusitis appears chronic -When necessary Norco #6.flaccid parlysis of legs;seen by neuro.MRI of cervical,throracic and lumbar spine negative for cord  Abnormality,agreeable for LP>but on plavix need to hold for 5 days. Follow clinically and likely benefit from rpt RI in 2-3 days.  6. Anemia of chronic disease; stable.  7. DVT prophylaxis Subcutaneous heparin  8. Hypokalemia   Pt is a dialysis pt, monitor potassium. #9.bilateral leg wekness;seen by neruology.MRI LS PINE non diagnostic,neurology recommends LP,will hold heparin,order IR guided LP tomorrow,d/w DR.Reynolds All the records are reviewed and case discussed with Care Management/Social Worker Management plans discussed with the patient, family and they are in agreement.  CODE STATUS: Fulll.  TOTAL TIME TAKING CARE OF THIS PATIENT: 20 minutes.    POSSIBLE D/C IN 1-2 DAYS, DEPENDING ON CLINICAL CONDITION.   Katha Hamming M.D on 01/15/2016   Between 7am to 6pm - Pager - [phone]  After 6pm go to [email or web address] - password [email or web address]  [facility]  Office  [phone]  CC: Primary care physician; [name], MD  Note: This dictation was prepared with Dragon dictation along with  smaller phrase technology. Any  transcriptional errors that result from this process are unintentional.

## 2016-01-16 DIAGNOSIS — E876 Hypokalemia: Secondary | ICD-10-CM

## 2016-01-16 DIAGNOSIS — A419 Sepsis, unspecified organism: Secondary | ICD-10-CM

## 2016-01-16 DIAGNOSIS — R6521 Severe sepsis with septic shock: Secondary | ICD-10-CM

## 2016-01-16 DIAGNOSIS — M48061 Spinal stenosis, lumbar region without neurogenic claudication: Secondary | ICD-10-CM

## 2016-01-16 DIAGNOSIS — Z960 Presence of urogenital implants: Secondary | ICD-10-CM

## 2016-01-16 DIAGNOSIS — Z978 Presence of other specified devices: Secondary | ICD-10-CM

## 2016-01-16 DIAGNOSIS — R29898 Other symptoms and signs involving the musculoskeletal system: Secondary | ICD-10-CM

## 2016-01-16 DIAGNOSIS — D638 Anemia in other chronic diseases classified elsewhere: Secondary | ICD-10-CM

## 2016-01-16 DIAGNOSIS — A498 Other bacterial infections of unspecified site: Secondary | ICD-10-CM

## 2016-01-16 LAB — CBC
HEMATOCRIT: 29.7 % — AB (ref 40.0–52.0)
Hemoglobin: 9.4 g/dL — ABNORMAL LOW (ref 13.0–18.0)
MCH: 28 pg (ref 26.0–34.0)
MCHC: 31.5 g/dL — ABNORMAL LOW (ref 32.0–36.0)
MCV: 88.9 fL (ref 80.0–100.0)
Platelets: 110 10*3/uL — ABNORMAL LOW (ref 150–440)
RBC: 3.34 MIL/uL — AB (ref 4.40–5.90)
RDW: 17.3 % — AB (ref 11.5–14.5)
WBC: 8.4 10*3/uL (ref 3.8–10.6)

## 2016-01-16 LAB — GLUCOSE, CAPILLARY
GLUCOSE-CAPILLARY: 125 mg/dL — AB (ref 65–99)
GLUCOSE-CAPILLARY: 129 mg/dL — AB (ref 65–99)
GLUCOSE-CAPILLARY: 274 mg/dL — AB (ref 65–99)
Glucose-Capillary: 113 mg/dL — ABNORMAL HIGH (ref 65–99)
Glucose-Capillary: 144 mg/dL — ABNORMAL HIGH (ref 65–99)

## 2016-01-16 LAB — RENAL FUNCTION PANEL
ALBUMIN: 1.9 g/dL — AB (ref 3.5–5.0)
ANION GAP: 11 (ref 5–15)
BUN: 33 mg/dL — AB (ref 6–20)
CO2: 26 mmol/L (ref 22–32)
Calcium: 8.7 mg/dL — ABNORMAL LOW (ref 8.9–10.3)
Chloride: 97 mmol/L — ABNORMAL LOW (ref 101–111)
Creatinine, Ser: 4.74 mg/dL — ABNORMAL HIGH (ref 0.61–1.24)
GFR, EST AFRICAN AMERICAN: 16 mL/min — AB (ref >60)
GFR, EST NON AFRICAN AMERICAN: 13 mL/min — AB (ref >60)
Glucose, Bld: 131 mg/dL — ABNORMAL HIGH (ref 65–99)
PHOSPHORUS: 5.7 mg/dL — AB (ref 2.5–4.6)
POTASSIUM: 4.3 mmol/L (ref 3.5–5.1)
Sodium: 134 mmol/L — ABNORMAL LOW (ref 135–145)

## 2016-01-16 MED ORDER — MIDODRINE HCL 10 MG PO TABS: 10.0000 mg | ORAL_TABLET | Freq: Every day | ORAL | Status: AC | PRN

## 2016-01-16 MED ORDER — INSULIN ASPART 100 UNIT/ML ~~LOC~~ SOLN
6.0000 [IU] | Freq: Once | SUBCUTANEOUS | Status: AC
  Administered 2016-01-16: 6 [IU] via SUBCUTANEOUS
  Filled 2016-01-16: qty 6

## 2016-01-16 MED ORDER — NEPRO/CARBSTEADY PO LIQD: 237.0000 mL | ORAL | Status: DC

## 2016-01-16 MED ORDER — SODIUM CHLORIDE 0.9 % IV SOLN: INTRAVENOUS | Status: DC

## 2016-01-16 MED ORDER — CHLORHEXIDINE GLUCONATE 0.12 % MT SOLN
15.0000 mL | Freq: Two times a day (BID) | OROMUCOSAL | Status: AC
Start: 2016-01-16 — End: ?

## 2016-01-16 MED ORDER — HYDROCODONE-ACETAMINOPHEN 5-325 MG PO TABS: 1.0000 | ORAL_TABLET | ORAL | Status: DC | PRN

## 2016-01-16 MED ORDER — CEPHALEXIN 500 MG PO CAPS: 500.0000 mg | ORAL_CAPSULE | ORAL | Status: DC

## 2016-01-16 MED ORDER — DOCUSATE SODIUM 100 MG PO CAPS: 100.0000 mg | ORAL_CAPSULE | Freq: Two times a day (BID) | ORAL | Status: DC

## 2016-01-16 MED ORDER — INSULIN GLARGINE 100 UNIT/ML ~~LOC~~ SOLN: 8.0000 [IU] | Freq: Every day | SUBCUTANEOUS | Status: DC

## 2016-01-16 NOTE — Progress Notes (Signed)
PT Cancellation Note  Patient Details Name: Taylor [name] MRN: [id number] DOB: 06/06/1971   Cancelled Treatment:    Reason Eval/Treat Not Completed: Patient at procedure or test/unavailable.  Pt currently off floor at dialysis.  Will re-attempt PT at a later date/time.   [name] 01/16/2016, 10:12 AM [name], PT [phone]

## 2016-01-16 NOTE — Progress Notes (Signed)
Hemodialysis completed. 

## 2016-01-16 NOTE — Progress Notes (Signed)
Pre-hd tx 

## 2016-01-16 NOTE — Progress Notes (Signed)
Called report to facility and EMS transport called.

## 2016-01-16 NOTE — Progress Notes (Signed)
Subjective: Patient reports improved strength in his lower extremities.    Objective: Current vital signs: BP 110/67 mmHg  Pulse 85  Temp(Src) 97.4 F (36.3 C) (Oral)  Resp 15  Ht 5\' 11"  (1.803 m)  Wt 89.3 kg (196 lb 13.9 oz)  BMI 27.47 kg/m2  SpO2 100% Vital signs in last 24 hours: Temp:  [97.4 F (36.3 C)-97.7 F (36.5 C)] 97.4 F (36.3 C) (03/09 0855) Pulse Rate:  [83-85] 85 (03/09 1030) Resp:  [15-19] 15 (03/09 1030) BP: (110-133)/(54-76) 110/67 mmHg (03/09 1030) SpO2:  [98 %-100 %] 100 % (03/09 0437) Weight:  [89.3 kg (196 lb 13.9 oz)] 89.3 kg (196 lb 13.9 oz) (03/09 0855)  Intake/Output from previous day: 03/08 0701 - 03/09 0700 In: 714.3 [I.V.:714.3] Out: 90 [Urine:90] Intake/Output this shift: Total I/O In: 100 [I.V.:100] Out: -  Nutritional status: Diet renal with fluid restriction Fluid restriction:: 1200 mL Fluid; Room service appropriate?: Yes; Fluid consistency:: Thin  Neurologic Exam: Mental Status: Lethargic. Speech fluent without evidence of aphasia but slurred. Able to follow 3 step commands without difficulty when kept alert. Cranial Nerves: II: Discs flat bilaterally; Visual fields grossly normal, pupils equal, round, reactive to light and accommodation III,IV, VI: ptosis not present, extra-ocular motions intact bilaterally V,VII: smile symmetric, facial light touch sensation normal bilaterally VIII: hearing normal bilaterally IX,X: gag reflex present XI: bilateral shoulder shrug XII: midline tongue extension Motor: Right :Upper extremity 5/5Left: Upper extremity 5/5 Able to bend both legs at the knees.  Able to abduct and adduct at the hips Sensory: Decreased pinprick and light touch below the knees bilaterally.   Deep Tendon Reflexes: 2+ in the upper extremities and absent in the lower extremities.    Lab Results: Basic Metabolic Panel:  Recent Labs Lab 01/10/16 0529 01/14/16 1605  01/16/16 1012  NA 133* 132* 134*  K 3.7 4.2 4.3  CL 97* 96* 97*  CO2 23 25 26   GLUCOSE 168* 107* 131*  BUN 43* 34* 33*  CREATININE 4.66* 4.77* 4.74*  CALCIUM 8.3* 8.3* 8.7*  PHOS  --  5.2* 5.7*    Liver Function Tests:  Recent Labs Lab 01/14/16 1605 01/16/16 1012  ALBUMIN 2.0* 1.9*   No results for input(s): LIPASE, AMYLASE in the last 168 hours. No results for input(s): AMMONIA in the last 168 hours.  CBC:  Recent Labs Lab 01/12/16 0740 01/14/16 1606 01/15/16 0525 01/16/16 1013  WBC 10.7* 9.7 8.0 8.4  HGB 9.1* 9.1* 9.6* 9.4*  HCT 28.8* 29.2* 30.2* 29.7*  MCV 89.3 88.5 88.6 88.9  PLT 79* 94* 92* 110*    Cardiac Enzymes: No results for input(s): CKTOTAL, CKMB, CKMBINDEX, TROPONINI in the last 168 hours.  Lipid Panel: No results for input(s): CHOL, TRIG, HDL, CHOLHDL, VLDL, LDLCALC in the last 168 hours.  CBG:  Recent Labs Lab 01/15/16 1601 01/15/16 1957 01/16/16 0025 01/16/16 0434 01/16/16 0840  GLUCAP 152* 158* 144* 125* 113*    Microbiology: Results for orders placed or performed during the hospital encounter of 01/07/16  Blood Culture (routine x 2)     Status: None   Collection Time: 01/07/16  6:33 AM  Result Value Ref Range Status   Specimen Description BLOOD RTA  Final   Special Requests   Final    BOTTLES DRAWN AEROBIC AND ANAEROBIC 01/09/16 AER 01/09/16 ANA   Culture NO GROWTH 5 DAYS  Final   Report Status 01/12/2016 FINAL  Final  Blood Culture (routine x 2)     Status: None  Collection Time: 01/07/16  6:58 AM  Result Value Ref Range Status   Specimen Description BLOOD RIGHT ARM  Final   Special Requests   Final    BOTTLES DRAWN AEROBIC AND ANAEROBIC AER ANA   Culture NO GROWTH 5 DAYS  Final   Report Status 01/12/2016 FINAL  Final  Urine culture     Status: None   Collection Time: 01/07/16 10:45 AM  Result Value Ref Range Status   Specimen Description URINE, RANDOM  Final   Special Requests NONE  Final   Culture >=100,000  COLONIES/mL ESCHERICHIA COLI  Final   Report Status 01/10/2016 FINAL  Final   Organism ID, Bacteria ESCHERICHIA COLI  Final      Susceptibility   Escherichia coli - MIC*    AMPICILLIN >=32 RESISTANT Resistant     CEFAZOLIN <=4 SENSITIVE Sensitive     CEFTRIAXONE <=1 SENSITIVE Sensitive     CIPROFLOXACIN >=4 RESISTANT Resistant     GENTAMICIN <=1 SENSITIVE Sensitive     IMIPENEM <=0.25 SENSITIVE Sensitive     NITROFURANTOIN <=16 SENSITIVE Sensitive     TRIMETH/SULFA >=320 RESISTANT Resistant     AMPICILLIN/SULBACTAM >=32 RESISTANT Resistant     PIP/TAZO <=4 SENSITIVE Sensitive     Extended ESBL NEGATIVE Sensitive     * >=100,000 COLONIES/mL ESCHERICHIA COLI  MRSA PCR Screening     Status: None   Collection Time: 01/07/16 12:20 PM  Result Value Ref Range Status   MRSA by PCR NEGATIVE NEGATIVE Final    Comment:        The GeneXpert MRSA Assay (FDA approved for NASAL specimens only), is one component of a comprehensive MRSA colonization surveillance program. It is not intended to diagnose MRSA infection nor to guide or monitor treatment for MRSA infections.   C difficile quick scan w PCR reflex     Status: None   Collection Time: 01/11/16 10:57 AM  Result Value Ref Range Status   C Diff antigen NEGATIVE NEGATIVE Final   C Diff toxin NEGATIVE NEGATIVE Final   C Diff interpretation Negative for C. difficile  Final    Coagulation Studies: No results for input(s): LABPROT, INR in the last 72 hours.  Imaging: No results found.  Medications:  I have reviewed the patient's current medications. Scheduled: . antiseptic oral rinse  7 mL Mouth Rinse q12n4p  . aspirin EC  81 mg Oral Daily  . calcium-vitamin D  1 tablet Oral Daily  . carbamide peroxide  2 drop Left Ear BID  . cephALEXin  500 mg Oral Q24H  . chlorhexidine  15 mL Mouth Rinse BID  . citalopram  20 mg Oral Daily  . docusate sodium  100 mg Oral BID  . epoetin (EPOGEN/PROCRIT) injection  10,000 Units Subcutaneous Q  T,Th,Sa-HD  . feeding supplement (NEPRO CARB STEADY)  237 mL Oral Q24H  . insulin aspart  1-3 Units Subcutaneous 6 times per day  . insulin glargine  8 Units Subcutaneous QHS  . levothyroxine  75 mcg Oral Q0600  . omega-3 acid ethyl esters  1 g Oral Daily  . pantoprazole  40 mg Oral Daily  . pregabalin  75 mg Oral BID  . rosuvastatin  20 mg Oral QHS  . sevelamer carbonate  800 mg Oral TID WC  . traZODone  50 mg Oral QHS    Assessment/Plan: Patient continues to improve.  Now able to move his lower extremities and sensation improved.  LP not indicated at this time.  Recommendations: 1.  Will continue to follow with you.  Would not repeat imaging at this time.     LOS: 9 days   Thana Farr, MD Neurology [phone] 01/16/2016  10:59 AM

## 2016-01-16 NOTE — Progress Notes (Signed)
[facility]  ROUNDING NOTE   Subjective:   Seen and examined on dialysis. Tolerating treatment.   Objective:  Vital signs in last 24 hours:  Temp:  [97.4 F (36.3 C)-97.6 F (36.4 C)] 97.4 F (36.3 C) (03/09 0855) Pulse Rate:  [82-85] 82 (03/09 1100) Resp:  [14-19] 14 (03/09 1100) BP: (110-133)/(54-76) 111/65 mmHg (03/09 1100) SpO2:  [98 %-100 %] 100 % (03/09 0437) Weight:  [89.3 kg (196 lb 13.9 oz)] 89.3 kg (196 lb 13.9 oz) (03/09 0855)  Weight change:  Filed Weights   01/14/16 1530 01/14/16 1743 01/16/16 0855  Weight: 91.9 kg (202 lb 9.6 oz) 89.5 kg (197 lb 5 oz) 89.3 kg (196 lb 13.9 oz)    Intake/Output: I/O last 3 completed shifts: In: 1020.2 [I.V.:1020.2] Out: 140 [Urine:140]   Intake/Output this shift:  Total I/O In: 460 [P.O.:360; I.V.:100] Out: -   Physical Exam: General: NAD, resting in bed  Head:  Moist oral mucosal membranes  Eyes: Anicteric,   Neck: Supple, trachea midline  Lungs:  Clear to auscultation normal effort  Heart: S1S2 no rubs, 2/6 murmur  Abdomen:  Soft, nontender, BS present, distended  Extremities: Anasarca++  Neurologic: Follows commands, not moving bilateral lower extremities.   Skin: Chronic venous statis changes  Access: LUE AVF    Basic Metabolic Panel:  Recent Labs Lab 01/10/16 0529 01/14/16 1605 01/16/16 1012  NA 133* 132* 134*  K 3.7 4.2 4.3  CL 97* 96* 97*  CO2 23 25 26   GLUCOSE 168* 107* 131*  BUN 43* 34* 33*  CREATININE 4.66* 4.77* 4.74*  CALCIUM 8.3* 8.3* 8.7*  PHOS  --  5.2* 5.7*    Liver Function Tests:  Recent Labs Lab 01/14/16 1605 01/16/16 1012  ALBUMIN 2.0* 1.9*   No results for input(s): LIPASE, AMYLASE in the last 168 hours. No results for input(s): AMMONIA in the last 168 hours.  CBC:  Recent Labs Lab 01/12/16 0740 01/14/16 1606 01/15/16 0525 01/16/16 1013  WBC 10.7* 9.7 8.0 8.4  HGB 9.1* 9.1* 9.6* 9.4*  HCT 28.8* 29.2* 30.2* 29.7*  MCV 89.3 88.5 88.6 88.9  PLT 79* 94*  92* 110*    Cardiac Enzymes: No results for input(s): CKTOTAL, CKMB, CKMBINDEX, TROPONINI in the last 168 hours.  BNP: Invalid input(s): POCBNP  CBG:  Recent Labs Lab 01/15/16 1601 01/15/16 1957 01/16/16 0025 01/16/16 0434 01/16/16 0840  GLUCAP 152* 158* 144* 125* 113*    Microbiology: Results for orders placed or performed during the hospital encounter of 01/07/16  Blood Culture (routine x 2)     Status: None   Collection Time: 01/07/16  6:33 AM  Result Value Ref Range Status   Specimen Description BLOOD RTA  Final   Special Requests   Final    BOTTLES DRAWN AEROBIC AND ANAEROBIC 01/09/16 AER ANA   Culture NO GROWTH 5 DAYS  Final   Report Status 01/12/2016 FINAL  Final  Blood Culture (routine x 2)     Status: None   Collection Time: 01/07/16  6:58 AM  Result Value Ref Range Status   Specimen Description BLOOD RIGHT ARM  Final   Special Requests   Final    BOTTLES DRAWN AEROBIC AND ANAEROBIC AER 01/09/16 ANA   Culture NO GROWTH 5 DAYS  Final   Report Status 01/12/2016 FINAL  Final  Urine culture     Status: None   Collection Time: 01/07/16 10:45 AM  Result Value Ref Range Status   Specimen Description  URINE, RANDOM  Final   Special Requests NONE  Final   Culture >=100,000 COLONIES/mL ESCHERICHIA COLI  Final   Report Status 01/10/2016 FINAL  Final   Organism ID, Bacteria ESCHERICHIA COLI  Final      Susceptibility   Escherichia coli - MIC*    AMPICILLIN >=32 RESISTANT Resistant     CEFAZOLIN <=4 SENSITIVE Sensitive     CEFTRIAXONE <=1 SENSITIVE Sensitive     CIPROFLOXACIN >=4 RESISTANT Resistant     GENTAMICIN <=1 SENSITIVE Sensitive     IMIPENEM <=0.25 SENSITIVE Sensitive     NITROFURANTOIN <=16 SENSITIVE Sensitive     TRIMETH/SULFA >=320 RESISTANT Resistant     AMPICILLIN/SULBACTAM >=32 RESISTANT Resistant     PIP/TAZO <=4 SENSITIVE Sensitive     Extended ESBL NEGATIVE Sensitive     * >=100,000 COLONIES/mL ESCHERICHIA COLI  MRSA PCR Screening     Status:  None   Collection Time: 01/07/16 12:20 PM  Result Value Ref Range Status   MRSA by PCR NEGATIVE NEGATIVE Final    Comment:        The GeneXpert MRSA Assay (FDA approved for NASAL specimens only), is one component of a comprehensive MRSA colonization surveillance program. It is not intended to diagnose MRSA infection nor to guide or monitor treatment for MRSA infections.   C difficile quick scan w PCR reflex     Status: None   Collection Time: 01/11/16 10:57 AM  Result Value Ref Range Status   C Diff antigen NEGATIVE NEGATIVE Final   C Diff toxin NEGATIVE NEGATIVE Final   C Diff interpretation Negative for C. difficile  Final    Coagulation Studies: No results for input(s): LABPROT, INR in the last 72 hours.  Urinalysis: No results for input(s): COLORURINE, LABSPEC, PHURINE, GLUCOSEU, HGBUR, BILIRUBINUR, KETONESUR, PROTEINUR, UROBILINOGEN, NITRITE, LEUKOCYTESUR in the last 72 hours.  Invalid input(s): APPERANCEUR    Imaging: No results found.   Medications:   . sodium chloride 30 mL/hr at 01/14/16 0548   . antiseptic oral rinse  7 mL Mouth Rinse q12n4p  . aspirin EC  81 mg Oral Daily  . calcium-vitamin D  1 tablet Oral Daily  . carbamide peroxide  2 drop Left Ear BID  . cephALEXin  500 mg Oral Q24H  . chlorhexidine  15 mL Mouth Rinse BID  . citalopram  20 mg Oral Daily  . docusate sodium  100 mg Oral BID  . epoetin (EPOGEN/PROCRIT) injection  10,000 Units Subcutaneous Q T,Th,Sa-HD  . feeding supplement (NEPRO CARB STEADY)  237 mL Oral Q24H  . insulin aspart  1-3 Units Subcutaneous 6 times per day  . insulin glargine  8 Units Subcutaneous QHS  . levothyroxine  75 mcg Oral Q0600  . omega-3 acid ethyl esters  1 g Oral Daily  . pantoprazole  40 mg Oral Daily  . pregabalin  75 mg Oral BID  . rosuvastatin  20 mg Oral QHS  . sevelamer carbonate  800 mg Oral TID WC  . traZODone  50 mg Oral QHS   acetaminophen **OR** acetaminophen, HYDROcodone-acetaminophen,  midodrine, morphine injection, naLOXone (NARCAN)  injection, ondansetron **OR** ondansetron (ZOFRAN) IV  Assessment/ Plan:  45 y.o. 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 TTS  1.  ESRD on HD TTHS:  Tolerating dialysis treatment.   2. Urinary tract infection with sepsis: e. Coli on urine culture.  - .  Septic shock/UTI: cephalexain.   3.  Anemia of CKD:  hgb 9.4 - epogen with HD.  4.  Secondary Hyperparathyroidism: PTH low at 104 on 12/24/15. Phos on admission elevated at 5.7 - hold cinacalcet - continue sevelamer for binding.    Overall prognosis is poor. Consult Palliative care. Pending.    LOS: 9 Cai Flott 3/9/201711:20 AM

## 2016-01-16 NOTE — Progress Notes (Signed)
Hemodialysis start 

## 2016-01-16 NOTE — Clinical Social Work Placement (Signed)
   CLINICAL SOCIAL WORK PLACEMENT  NOTE  Date:  01/16/2016  Patient Details  Name: Taylor [name] MRN: [id number] Date of Birth: Oct 29, 1971  Clinical Social Work is seeking post-discharge placement for this patient at the Skilled  Nursing Facility level of care (*CSW will initial, date and re-position this form in  chart as items are completed):  Yes   Patient/family provided with [place] Clinical Social Work Department's list of facilities offering this level of care within the geographic area requested by the patient (or if unable, by the patient's family).  Yes   Patient/family informed of their freedom to choose among providers that offer the needed level of care, that participate in Medicare, Medicaid or managed care program needed by the patient, have an available bed and are willing to accept the patient.  Yes   Patient/family informed of [facility]'s ownership interest in [facility] and [facility], as well as of the fact that they are under no obligation to receive care at these facilities.  PASRR submitted to EDS on       PASRR number received on       Existing PASRR number confirmed on 01/15/16     FL2 transmitted to all facilities in geographic area requested by pt/family on 01/15/16     FL2 transmitted to all facilities within larger geographic area on       Patient informed that his/her managed care company has contracts with or will negotiate with certain facilities, including the following:        Yes   Patient/family informed of bed offers received.  Patient chooses bed at [facility]     Physician recommends and patient chooses bed at  [facility])    Patient to be transferred to [facility] on 01/16/16.  Patient to be transferred to facility by [facility]     Patient family notified on 01/16/16 of transfer.  Name of family member notified:  Pt's father     PHYSICIAN       Additional Comment:     _______________________________________________ Dede Query, LCSW 01/16/2016, 4:34 PM

## 2016-01-16 NOTE — Progress Notes (Signed)
Post hd tx 

## 2016-01-16 NOTE — Care Management (Signed)
Patient to discharge to [facility].  CSW facilitating.

## 2016-01-16 NOTE — Progress Notes (Signed)
[facility] - [place] at [facility]   PATIENT NAME: Taylor [name]    MR#:  [id number]  DATE OF BIRTH:  02-Feb-1971  SUBJECTIVE;still feels weak in legs.no other symptoms.now agreeable  For LP<  CHIEF COMPLAINT:   Chief Complaint  Patient presents with  . Headache     Came septic, hypotensive on levophed. Have Ch suprapubic catheter and foley   BP is stable now.  does not have any diarrhea.   Patient was seen in hemodialysis now admits of improving strength in lower extremities. Patient had MRI of his lumbar spine revealing moderately severe spinal stenosis, no acute abnormality, though.   REVIEW OF SYSTEMS:  CONSTITUTIONAL: weakness in both legs. EYES: No blurred or double vision.  EARS, NOSE, AND THROAT: No tinnitus or ear pain. But very hard of hearing. RESPIRATORY: No cough, shortness of breath, wheezing or hemoptysis.  CARDIOVASCULAR: No chest pain, orthopnea, edema.  GASTROINTESTINAL: No nausea, vomiting, diarrhea or abdominal pain.  GENITOURINARY: No dysuria, hematuria.  ENDOCRINE: No polyuria, nocturia,  HEMATOLOGY: No anemia, easy bruising or bleeding SKIN: No rash or lesion. MUSCULOSKELETAL:c/o back pain  NEUROLOGIC:bilateral leg weakness.unalble to  Move legs,no slurred speech.no tingling numbness in legs. PSYCHIATRY; has  anxiety, depression.   Review of Systems  Unable to perform ROS: mental acuity    DRUG ALLERGIES:   Allergies  Allergen Reactions  . Amoxicillin-Pot Clavulanate Nausea And Vomiting and Itching  . 2nd Skin Quick Heal Other (See Comments)  . Rifampin Nausea And Vomiting  . Tape Other (See Comments)    VITALS:  Blood pressure 126/69, pulse 84, temperature 97.4 F (36.3 C), temperature source Oral, resp. rate 14, height 5\' 11"  (1.803 m), weight 89.3 kg (196 lb 13.9 oz), SpO2 100 %.  PHYSICAL EXAMINATION:   GENERAL: 45 y.o.-year-old patient lying in the bed with no acute distress. Sleepy, awakened from  sleep EYES: Pupils equal, round, reactive to light and accommodation. No scleral icterus. Extraocular muscles intact. Pallor HEENT: Head atraumatic, normocephalic. Oropharynx and nasopharynx clear.  NECK: Supple, no jugular venous distention. No thyroid enlargement, no tenderness.  LUNGS: Normal breath sounds bilaterally, no wheezing, rales,rhonchi or crepitation. No use of accessory muscles of respiration.  CARDIOVASCULAR: S1, S2 , 2/6 systolic murmurs, no rubs, or gallops. Left upper extremity AV fistula with bruit and thrill ABDOMEN: Soft, nontender, distended. Bowel sounds present. No organomegaly or mass. supra pubic catheter present. EXTREMITIES: ++pedal edema, nocyanosis, or clubbing.  NEUROLOGIC: Cranial nerves II through XII are intact.. Unable move legs bilaterally,power 2/5 bilaterally PSYCHIATRIC: The patient is alert and oriented x 3.  SKIN: No obvious rash, lesion, or ulcer.  Patient has chronic skin changes in the lower extremity with venous stasis changes.   Physical Exam LABORATORY PANEL:   CBC  Recent Labs Lab 01/16/16 1013  WBC 8.4  HGB 9.4*  HCT 29.7*  PLT 110*   ------------------------------------------------------------------------------------------------------------------  Chemistries   Recent Labs Lab 01/16/16 1012  NA 134*  K 4.3  CL 97*  CO2 26  GLUCOSE 131*  BUN 33*  CREATININE 4.74*  CALCIUM 8.7*   ------------------------------------------------------------------------------------------------------------------  Cardiac Enzymes No results for input(s): TROPONINI in the last 168 hours. ------------------------------------------------------------------------------------------------------------------  RADIOLOGY:  No results found.  ASSESSMENT AND PLAN:   Active Problems:   Sepsis (HCC)  1. Sepsis/septic shock - source  UTI due to Escherichia coli. Septic shock has resolved, blood pressure improved with therapy. Blood cultures  remain negative so far. Urine cultures positive for Escherichia coli. The patient  is on Keflex at present  -  2. ESRD on hemodialysis -Nephrology consultation for in-house dialysis, hemodialysis today -  3. 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 Thea Silversmith from [facility] urology to see patient for suprapubic catheter functioning -He recommends to get bladder scan done to see patient has urinary retention  4. Hypotension , restarted Coreg yesterday. Resume Imdur and lisinopril upon discharge   5. Headaches with sinus pain with history of sinusitis appears chronic -When necessary Norco  6. Lower extremity weakness, likely chronic, seen by neuro. MRI of cervical,throracic and lumbar spine negative for cord  Abnormality. No LP was recommended by neurologist. Continue physical therapy, that he skilled nursing facility placement. Getting palliative care involved for further recommendations   6. Anemia of chronic disease; stable.  7. DVT prophylaxis Subcutaneous heparin  8. Hypokalemia   Pt is a dialysis pt, monitor potassium.   All the records are reviewed and case discussed with Care Management/Social Worker Management plans discussed with the patient, family and they are in agreement.  CODE STATUS: Fulll.  TOTAL TIME TAKING CARE OF THIS PATIENT: 30 minutes.  Discussed with Dr. Wynelle Link  POSSIBLE D/C IN 1-2 DAYS, DEPENDING ON CLINICAL CONDITION.   Katharina Caper M.D on 01/16/2016   Between 7am to 6pm - Pager - [phone]  After 6pm go to [email or web address] - password [email or web address]  [facility]  Office  [phone]  CC: Primary care physician; [name], MD  Note: This dictation was prepared with Dragon dictation along with smaller phrase technology. Any transcriptional errors that result from this process are unintentional.

## 2016-01-16 NOTE — Clinical Social Work Note (Signed)
Pt is ready for discharge today and will go to [facility]. Pt and father are agreeable to discharge plan. Facility has received discharge information and is ready to admit pt. Admissions coordinator will contact pt's father upon pt's arrival to facility, per his request. RN to call report to #[phone] (Room# 602). [facility] will provide transportation. CSW is signing off as no further needs identified.   Dede Query, MCSW, LCSW  Clinical Social Worker  [phone]

## 2016-01-16 NOTE — Care Management Important Message (Signed)
Important Message  Patient Details  Name: Taylor [name] MRN: [id number] Date of Birth: 08/29/1971   Medicare Important Message Given:  Yes    Olegario Messier A Ulric Salzman 01/16/2016, 12:07 PM

## 2016-01-16 NOTE — Discharge Summary (Signed)
[facility] - [place] at [facility]   PATIENT NAME: Taylor [name]    MR#:  [id number]  DATE OF BIRTH:  1970/11/26  DATE OF ADMISSION:  01/07/2016 ADMITTING PHYSICIAN: Enedina Finner, MD  DATE OF DISCHARGE: No discharge date for patient encounter.  PRIMARY CARE PHYSICIAN: [name], MD     ADMISSION DIAGNOSIS:  Other specified hypotension [I95.89] Generalized weakness [R53.1] Sepsis, due to unspecified organism (HCC) [A41.9] Acute nonintractable headache, unspecified headache type [R51]  DISCHARGE DIAGNOSIS:  Active Problems:   Sepsis (HCC)   Septic shock (HCC)   Chronic indwelling Foley catheter   Lower extremity weakness   Spinal stenosis of lumbar region   Escherichia coli (E. coli) infection   Hypokalemia   Anemia of chronic disease   SECONDARY DIAGNOSIS:   Past Medical History  Diagnosis Date  . End stage renal disease on dialysis (HCC)     LUE fistula  . Type I diabetes mellitus (HCC)     a. 03/2014 admitted with HNK to [facility]. b. TTS  . Diabetic neuropathy (HCC)     severe, s/p multiple toe amputation  . Hypothyroidism   . Hypertension   . Hyperlipidemia   . H/O hiatal hernia   . GERD (gastroesophageal reflux disease)   . Anxiety   . Sebaceous cyst     side of neck  . Pneumonia     2010  . Anemia     a. req PRBC's 2011.  [name] PAD (peripheral artery disease) (HCC)     a. s/p amputation of toes on the right;  b. left LE claudication.  . Coronary artery disease     a. s/p MI;  b. 10/2009 CABG x 3 @ [facility]: LIMA->LAD, VG->OM3, VG->RPDA; c. 11/2010 Cath 3/3 patent grafts;  d 12/2012 Cath: LM 30d, LAD 85p, D1 70, D2 90, LCX 40ost, OM2 100, RCA 90p, 151m, L->LAD ok, VG->OM3 ok, VG->RPDA 30, EF 50%->Med Rx.  . Cataract     right  . Valvular disease     a. 11/2012 Echo: EF 55-60%, mild LVH, mild MR, mild bi-atrial enlargement, mild-mod TR, PASP ; b. echo 03/2014: EF 50-55%, nl WM, select images concerning for bicuspid aortic valve w/ nl  thicknes of leaflets, mild MR, mild bi-atrial dilatation, RV mild dilatation - wall thickness nl, mod TR - select images appears to be mod to sev, PASP at least mod elevated.  . Diastolic CHF (HCC)     see echo above  . Depression   . Arthritis     rheumatoid arthritis   . Myocardial infarction (HCC) 2010  . Pulmonary HTN (HCC)     a. continuation of 03/2014 echo PASP @ least mod elevated. RVSP 52 mm Hg. Parasternal long axis estimated @ 85 mm Hg  . Scrotal abscess     a. s/p multiple I&D  . Penile abscess     a. s/p multiple I&D  . Hematemesis     a. EGD 2016 with LA Grade C esophagitis, continued on Protonix   . Chronic respiratory failure (HCC)     a. on 3L oxygen via nasal cannula  . Anxiety   . Acute delirium   . Sepsis (HCC)   . 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 MYOCARDIAL INFARCTION, HX OF 01/26/2011    Qualifier: Diagnosis of  By: [name] MD, [name]    . HTN (hypertension) 02/11/2011  . Pulmonary hypertension associated with end stage renal disease on dialysis (HCC) 04/16/2014  . Hyperkalemia   .  Hypothyroidism   . Osteomyelitis (HCC)     .pro HOSPITAL COURSE:   The patient is a 46 year old Caucasian male with past medical history significant history of end-stage renal disease, on hemodialysis, chronic diastolic CHF, diabetic neuropathy, urethrocutaneous fistula history of penile prosthesis, scrotal abscess requiring explorative surgery, status post suprapubic catheter placement, status post replacement. 2 weeks prior to coming to the hospital, presents to the hospital with complaints of declining urinary output, weakness. On arrival to the hospital. He was hypotensive with systolic blood pressure in 70s, hypothermic with temperature of 96.5. Sepsis workup was initiated. Urinalysis showed too numerous to count white blood cells. Labs revealed leukocytosis, anemia, thrombocytopenia, elevated lactic acid level, hypokalemia, chronic renal insufficiency. Patient was admitted to  intensive care unit requiring pressors. He was given IV fluids, pressors, antibiotics and his condition improved. Blood cultures taken on 01/07/2016 showed no growth. Urine culture revealed the Escherichia coli resistant to ampicillin and ampicillin sulbactam, but sensitive to all other antibiotics. Patient was initially managed on broad-spectrum antibiotic therapy. However, his antibiotics were changed to Keflex orally. Patient is to continue antibiotics for 5 more days to complete course of note, C. difficile test was negative as well as MRSA PCR. Patient's condition overall improved, as time went on, however, he was complaining of lower extremity weakness. Patient was seen by neurologist and MRI of cervical, thoracic as well as lumbar spine were performed, revealing central disc protrusion at the level of L3, L4, contributing to moderate to severe spinal stenosis but no acute osseous abnormality, moderate essential and right greater than left foraminal stenosis at C5-6 level was also noted and moderate left central and foraminal stenosis at C6-7. CT of head revealed no acute intracranial findings. Physical therapy was recommended in skilled nursing facility placement. The patient may benefit from a neurosurgery evaluation as outpatient Discussion by problem 1. Sepsis with septic shock - source UTI due to Escherichia coli. Septic shock has resolved, blood pressure improved with therapy. Blood cultures remain negative . Urine cultures positive for Escherichia coli. The patient is to continue Keflex for 5 more days to complete course  -  2. ESRD on hemodialysis -Nephrology consultation for in-house dialysis, hemodialysis 9th of March 2017 -  3. History of suprapubic catheter secondary to urethrocutaneous fistula chronic, follow-up with [facility] urology for further recommendations   4. Hypotension , restarted Coreg yesterday. Resume Imdur, clonidine and lisinopril upon discharge, following blood  pressure readings closely   5. Headaches with sinus pain with history of sinusitis appears chronic -When necessary Norco  6. Lower extremity weakness, likely chronic, seen by neuro. MRI of cervical,throracic and lumbar spine negative for cord Abnormality. No LP was recommended by neurologist. Continue physical therapy, patient is being discharged to skilled nursing facility, he would benefit from palliative care evaluation. In the facility. He would also benefit from neurosurgical evaluation as outpatient. Norco as needed for back pains   6. Anemia of chronic disease; stable.   8. Hypokalemia  Pt is a dialysis pt, monitor potassium, resolved  DISCHARGE CONDITIONS:   Stable  CONSULTS OBTAINED:  Treatment Team:  Vanna Scotland, MD Mady Haagensen, MD Kym Groom, MD  DRUG ALLERGIES:   Allergies  Allergen Reactions  . Amoxicillin-Pot Clavulanate Nausea And Vomiting and Itching  . 2nd Skin Quick Heal Other (See Comments)  . Rifampin Nausea And Vomiting  . Tape Other (See Comments)    DISCHARGE MEDICATIONS:   Current Discharge Medication List    START taking these medications  Details  cephALEXin (KEFLEX) 500 MG capsule Take 1 capsule (500 mg total) by mouth daily. Qty: 5 capsule, Refills: 0    chlorhexidine (PERIDEX) 0.12 % solution 15 mLs by Mouth Rinse route 2 (two) times daily. Qty: 120 mL, Refills: 0    docusate sodium (COLACE) 100 MG capsule Take 1 capsule (100 mg total) by mouth 2 (two) times daily. Qty: 10 capsule, Refills: 0    HYDROcodone-acetaminophen (NORCO/VICODIN) 5-325 MG tablet Take 1-2 tablets by mouth every 4 (four) hours as needed for moderate pain. Qty: 30 tablet, Refills: 0    midodrine (PROAMATINE) 10 MG tablet Take 1 tablet (10 mg total) by mouth daily as needed (Administer prior to dialysis). Qty: 30 tablet, Refills: 0    Nutritional Supplements (FEEDING SUPPLEMENT, NEPRO CARB STEADY,) LIQD Take 237 mLs by mouth daily. Qty: 30 Can,  Refills: 5      CONTINUE these medications which have CHANGED   Details  insulin glargine (LANTUS) 100 UNIT/ML injection Inject 0.08 mLs (8 Units total) into the skin at bedtime. Qty: 10 mL, Refills: 11      CONTINUE these medications which have NOT CHANGED   Details  acetaminophen (TYLENOL) 500 MG tablet Take 1,000 mg by mouth every 6 (six) hours as needed for mild pain or headache.     alum & mag hydroxide-simeth (MAALOX PLUS) 400-400-40 MG/5ML suspension Take 30 mLs by mouth every 4 (four) hours as needed for indigestion.     amitriptyline (ELAVIL) 25 MG tablet Take 1 tablet (25 mg total) by mouth at bedtime. Qty: 30 tablet, Refills: 1    aspirin EC 81 MG tablet Take 81 mg by mouth daily.    calcium-vitamin D (OSCAL WITH D) 250-125 MG-UNIT tablet Take 1 tablet by mouth daily.    carvedilol (COREG) 25 MG tablet Take 1 tablet (25 mg total) by mouth 2 (two) times daily with a meal. Qty: 180 tablet, Refills: 1    cinacalcet (SENSIPAR) 30 MG tablet Take 30 mg by mouth daily.    citalopram (CELEXA) 20 MG tablet Take 20 mg by mouth daily.    clopidogrel (PLAVIX) 75 MG tablet Take 1 tablet (75 mg total) by mouth daily. Qty: 90 tablet, Refills: 3    DHA-EPA-VITAMIN E PO Take 1 capsule by mouth daily.    furosemide (LASIX) 80 MG tablet Take 80 mg by mouth 2 (two) times daily.    ibuprofen (ADVIL,MOTRIN) 200 MG tablet Take 200 mg by mouth every 6 (six) hours as needed. Patient states he only takes 2 a day    insulin aspart (NOVOLOG) 100 UNIT/ML injection Inject 0-7 Units into the skin 4 (four) times daily -  before meals and at bedtime. Qty: 10 mL, Refills: 11    levothyroxine (SYNTHROID, LEVOTHROID) 75 MCG tablet Take 1 tablet (75 mcg total) by mouth daily before breakfast. Qty: 90 tablet, Refills: 1    metolazone (ZAROXOLYN) 5 MG tablet Take 5 mg by mouth daily.    metronidazole (FLAGYL) 375 MG capsule Take 375 mg by mouth 2 (two) times daily.    nitroGLYCERIN (NITROSTAT)  0.4 MG SL tablet Place 0.4 mg under the tongue every 5 (five) minutes as needed for chest pain.    Omega-3 Fatty Acids (FISH OIL) 1000 MG CAPS Take 1 capsule by mouth daily.    omeprazole (PRILOSEC) 40 MG capsule Take 40 mg by mouth daily.    OXYGEN Inhale into the lungs.    pantoprazole (PROTONIX) 40 MG tablet Take 40 mg by  mouth 2 (two) times daily.    pregabalin (LYRICA) 75 MG capsule Take 75 mg by mouth 2 (two) times daily.     PROAIR HFA 108 (90 Base) MCG/ACT inhaler Inhale 1-2 puffs into the lungs every 6 (six) hours as needed.  Refills: 1    Probiotic CAPS Take 1 capsule by mouth daily. Qty: 10 capsule, Refills: 0    rosuvastatin (CRESTOR) 20 MG tablet Take 1 tablet (20 mg total) by mouth at bedtime. Qty: 30 tablet, Refills: 5    sevelamer carbonate (RENVELA) 800 MG tablet Take 800 mg by mouth 3 (three) times daily with meals.     traZODone (DESYREL) 50 MG tablet Take 50 mg by mouth at bedtime.      STOP taking these medications     cloNIDine (CATAPRES) 0.1 MG tablet      isosorbide dinitrate (ISORDIL) 30 MG tablet      lisinopril (PRINIVIL,ZESTRIL) 10 MG tablet          DISCHARGE INSTRUCTIONS:    Patient is to follow-up with primary care physician, neurosurgery, palliative care, urology as outpatient  If you experience worsening of your admission symptoms, develop shortness of breath, life threatening emergency, suicidal or homicidal thoughts you must seek medical attention immediately by calling 911 or calling your MD immediately  if symptoms less severe.  You Must read complete instructions/literature along with all the possible adverse reactions/side effects for all the Medicines you take and that have been prescribed to you. Take any new Medicines after you have completely understood and accept all the possible adverse reactions/side effects.   Please note  You were cared for by a hospitalist during your hospital stay. If you have any questions about your  discharge medications or the care you received while you were in the hospital after you are discharged, you can call the unit and asked to speak with the hospitalist on call if the hospitalist that took care of you is not available. Once you are discharged, your primary care physician will handle any further medical issues. Please note that NO REFILLS for any discharge medications will be authorized once you are discharged, as it is imperative that you return to your primary care physician (or establish a relationship with a primary care physician if you do not have one) for your aftercare needs so that they can reassess your need for medications and monitor your lab values.    Today   CHIEF COMPLAINT:   Chief Complaint  Patient presents with  . Headache    HISTORY OF PRESENT ILLNESS:  Taylor [name]  is a 45 y.o. male with a known history of end-stage renal disease, on hemodialysis, chronic diastolic CHF, diabetic neuropathy, urethrocutaneous fistula history of penile prosthesis, scrotal abscess requiring explorative surgery, status post suprapubic catheter placement, status post replacement. 2 weeks prior to coming to the hospital, presents to the hospital with complaints of declining urinary output, weakness. On arrival to the hospital. He was hypotensive with systolic blood pressure in 70s, hypothermic with temperature of 96.5. Sepsis workup was initiated. Urinalysis showed too numerous to count white blood cells. Labs revealed leukocytosis, anemia, thrombocytopenia, elevated lactic acid level, hypokalemia, chronic renal insufficiency. Patient was admitted to intensive care unit requiring pressors. He was given IV fluids, pressors, antibiotics and his condition improved. Blood cultures taken on 01/07/2016 showed no growth. Urine culture revealed the Escherichia coli resistant to ampicillin and ampicillin sulbactam, but sensitive to all other antibiotics. Patient was initially managed on  broad-spectrum  antibiotic therapy. However, his antibiotics were changed to Keflex orally. Patient is to continue antibiotics for 5 more days to complete course of note, C. difficile test was negative as well as MRSA PCR. Patient's condition overall improved, as time went on, however, he was complaining of lower extremity weakness. Patient was seen by neurologist and MRI of cervical, thoracic as well as lumbar spine were performed, revealing central disc protrusion at the level of L3, L4, contributing to moderate to severe spinal stenosis but no acute osseous abnormality, moderate essential and right greater than left foraminal stenosis at C5-6 level was also noted and moderate left central and foraminal stenosis at C6-7. CT of head revealed no acute intracranial findings. Physical therapy was recommended in skilled nursing facility placement. The patient may benefit from a neurosurgery evaluation as outpatient Discussion by problem 1. Sepsis with septic shock - source UTI due to Escherichia coli. Septic shock has resolved, blood pressure improved with therapy. Blood cultures remain negative . Urine cultures positive for Escherichia coli. The patient is to continue Keflex for 5 more days to complete course  -  2. ESRD on hemodialysis -Nephrology consultation for in-house dialysis, hemodialysis 9th of March 2017 -  3. History of suprapubic catheter secondary to urethrocutaneous fistula chronic, follow-up with [facility] urology for further recommendations   4. Hypotension , restarted Coreg yesterday. Resume Imdur, clonidine and lisinopril upon discharge, following blood pressure readings closely   5. Headaches with sinus pain with history of sinusitis appears chronic -When necessary Norco  6. Lower extremity weakness, likely chronic, seen by neuro. MRI of cervical,throracic and lumbar spine negative for cord Abnormality. No LP was recommended by neurologist. Continue physical therapy, patient is  being discharged to skilled nursing facility, he would benefit from palliative care evaluation. In the facility. He would also benefit from neurosurgical evaluation as outpatient. Norco as needed for back pains   6. Anemia of chronic disease; stable.   8. Hypokalemia  Pt is a dialysis pt, monitor potassium, resolved    VITAL SIGNS:  Blood pressure 131/67, pulse 90, temperature 98 F (36.7 C), temperature source Oral, resp. rate 14, height 5\' 11"  (1.803 m), weight 85.8 kg (189 lb 2.5 oz), SpO2 100 %.  I/O:   Intake/Output Summary (Last 24 hours) at 01/16/16 1530 Last data filed at 01/16/16 1334  Gross per 24 hour  Intake 1100.32 ml  Output   3590 ml  Net -2489.68 ml    PHYSICAL EXAMINATION:  GENERAL:  45 y.o.-year-old patient lying in the bed with no acute distress.  EYES: Pupils equal, round, reactive to light and accommodation. No scleral icterus. Extraocular muscles intact.  HEENT: Head atraumatic, normocephalic. Oropharynx and nasopharynx clear.  NECK:  Supple, no jugular venous distention. No thyroid enlargement, no tenderness.  LUNGS: Normal breath sounds bilaterally, no wheezing, rales,rhonchi or crepitation. No use of accessory muscles of respiration.  CARDIOVASCULAR: S1, S2 normal. No murmurs, rubs, or gallops.  ABDOMEN: Soft, non-tender, non-distended. Bowel sounds present. No organomegaly or mass.  EXTREMITIES: No pedal edema, cyanosis, or clubbing.  NEUROLOGIC: Cranial nerves II through XII are intact. Muscle strength 5/5 in all extremities. Sensation intact. Gait not checked.  PSYCHIATRIC: The patient is alert and oriented x 3.  SKIN: No obvious rash, lesion, or ulcer.   DATA REVIEW:   CBC  Recent Labs Lab 01/16/16 1013  WBC 8.4  HGB 9.4*  HCT 29.7*  PLT 110*    Chemistries   Recent Labs Lab 01/16/16 1012  NA 134*  K 4.3  CL 97*  CO2 26  GLUCOSE 131*  BUN 33*  CREATININE 4.74*  CALCIUM 8.7*    Cardiac Enzymes No results for input(s):  TROPONINI in the last 168 hours.  Microbiology Results  Results for orders placed or performed during the hospital encounter of 01/07/16  Blood Culture (routine x 2)     Status: None   Collection Time: 01/07/16  6:33 AM  Result Value Ref Range Status   Specimen Description BLOOD RTA  Final   Special Requests   Final    BOTTLES DRAWN AEROBIC AND ANAEROBIC AER ANA   Culture NO GROWTH 5 DAYS  Final   Report Status 01/12/2016 FINAL  Final  Blood Culture (routine x 2)     Status: None   Collection Time: 01/07/16  6:58 AM  Result Value Ref Range Status   Specimen Description BLOOD RIGHT ARM  Final   Special Requests   Final    BOTTLES DRAWN AEROBIC AND ANAEROBIC AER ANA   Culture NO GROWTH 5 DAYS  Final   Report Status 01/12/2016 FINAL  Final  Urine culture     Status: None   Collection Time: 01/07/16 10:45 AM  Result Value Ref Range Status   Specimen Description URINE, RANDOM  Final   Special Requests NONE  Final   Culture >=100,000 COLONIES/mL ESCHERICHIA COLI  Final   Report Status 01/10/2016 FINAL  Final   Organism ID, Bacteria ESCHERICHIA COLI  Final      Susceptibility   Escherichia coli - MIC*    AMPICILLIN >=32 RESISTANT Resistant     CEFAZOLIN <=4 SENSITIVE Sensitive     CEFTRIAXONE <=1 SENSITIVE Sensitive     CIPROFLOXACIN >=4 RESISTANT Resistant     GENTAMICIN <=1 SENSITIVE Sensitive     IMIPENEM <=0.25 SENSITIVE Sensitive     NITROFURANTOIN <=16 SENSITIVE Sensitive     TRIMETH/SULFA >=320 RESISTANT Resistant     AMPICILLIN/SULBACTAM >=32 RESISTANT Resistant     PIP/TAZO <=4 SENSITIVE Sensitive     Extended ESBL NEGATIVE Sensitive     * >=100,000 COLONIES/mL ESCHERICHIA COLI  MRSA PCR Screening     Status: None   Collection Time: 01/07/16 12:20 PM  Result Value Ref Range Status   MRSA by PCR NEGATIVE NEGATIVE Final    Comment:        The GeneXpert MRSA Assay (FDA approved for NASAL specimens only), is one component of a comprehensive MRSA  colonization surveillance program. It is not intended to diagnose MRSA infection nor to guide or monitor treatment for MRSA infections.   C difficile quick scan w PCR reflex     Status: None   Collection Time: 01/11/16 10:57 AM  Result Value Ref Range Status   C Diff antigen NEGATIVE NEGATIVE Final   C Diff toxin NEGATIVE NEGATIVE Final   C Diff interpretation Negative for C. difficile  Final    RADIOLOGY:  No results found.  EKG:   Orders placed or performed during the hospital encounter of 01/07/16  . ED EKG  . ED EKG      Management plans discussed with the patient, family and they are in agreement.  CODE STATUS:     Code Status Orders        Start     Ordered   01/07/16 1200  Do not attempt resuscitation (DNR)   Continuous    Question Answer Comment  In the event of cardiac or respiratory ARREST Do not call  a "code blue"   In the event of cardiac or respiratory ARREST Do not perform Intubation, CPR, defibrillation or ACLS   In the event of cardiac or respiratory ARREST Use medication by any route, position, wound care, and other measures to relive pain and suffering. May use oxygen, suction and manual treatment of airway obstruction as needed for comfort.      01/07/16 1159    Code Status History    Date Active Date Inactive Code Status Order ID Comments User Context   10/10/2015  3:27 PM 10/17/2015  8:50 PM Full Code [id number]  Katha Hamming, MD ED   05/27/2015  9:57 AM 05/27/2015  4:14 PM Full Code [id number]  [name], MD Inpatient   03/13/2015 11:35 PM 03/19/2015 10:13 PM Partial Code [id number]  Ramonita Lab, MD Inpatient   03/09/2015 11:45 PM 03/12/2015  4:43 PM Full Code [id number]  Areta Haber, RN Inpatient   02/02/2015  5:04 AM 02/11/2015 11:21 PM Full Code [id number]  Rhetta Mura, MD ED   12/10/2014  6:31 PM 12/21/2014  5:57 PM Full Code [id number]  Meredeth Ide, MD Inpatient   08/23/2014  2:13 AM 08/27/2014  4:30 PM Full Code [id number]   Yevonne Pax, MD Inpatient   07/24/2014  1:25 PM 07/25/2014  1:50 PM Full Code [id number]  [name], MD Inpatient   01/04/2012  7:05 PM 01/10/2012  1:20 PM DNR [id number]  Elease Etienne, MD ED      TOTAL TIME TAKING CARE OF THIS PATIENT: 40 minutes.    Katharina Caper M.D on 01/16/2016 at 3:30 PM  Between 7am to 6pm - Pager - [phone]  After 6pm go to [email or web address] - password [email or web address] [facility]  [place] [facility]  Office  [phone]  CC: Primary care physician; [name], MD

## 2016-01-20 ENCOUNTER — Ambulatory Visit: Payer: Medicare Other | Admitting: Urology

## 2016-01-22 ENCOUNTER — Other Ambulatory Visit: Payer: Self-pay | Admitting: *Deleted

## 2016-01-22 DIAGNOSIS — Z029 Encounter for administrative examinations, unspecified: Secondary | ICD-10-CM

## 2016-01-22 DIAGNOSIS — E1051 Type 1 diabetes mellitus with diabetic peripheral angiopathy without gangrene: Secondary | ICD-10-CM

## 2016-01-22 DIAGNOSIS — N185 Chronic kidney disease, stage 5: Secondary | ICD-10-CM

## 2016-01-23 ENCOUNTER — Inpatient Hospital Stay
Admission: EM | Admit: 2016-01-23 | Discharge: 2016-02-03 | DRG: 698 | Disposition: A | Discharge status: Skilled Nursing Facility | Payer: Medicare Other | Attending: Internal Medicine | Admitting: Internal Medicine

## 2016-01-23 ENCOUNTER — Emergency Department: Payer: Medicare Other

## 2016-01-23 ENCOUNTER — Encounter: Payer: Self-pay | Admitting: Emergency Medicine

## 2016-01-23 DIAGNOSIS — I2510 Atherosclerotic heart disease of native coronary artery without angina pectoris: Secondary | ICD-10-CM | POA: Diagnosis present

## 2016-01-23 DIAGNOSIS — T83518A Infection and inflammatory reaction due to other urinary catheter, initial encounter: Principal | ICD-10-CM | POA: Diagnosis present

## 2016-01-23 DIAGNOSIS — F329 Major depressive disorder, single episode, unspecified: Secondary | ICD-10-CM | POA: Diagnosis present

## 2016-01-23 DIAGNOSIS — Z89422 Acquired absence of other left toe(s): Secondary | ICD-10-CM

## 2016-01-23 DIAGNOSIS — I248 Other forms of acute ischemic heart disease: Secondary | ICD-10-CM | POA: Diagnosis present

## 2016-01-23 DIAGNOSIS — Z794 Long term (current) use of insulin: Secondary | ICD-10-CM | POA: Diagnosis not present

## 2016-01-23 DIAGNOSIS — Z833 Family history of diabetes mellitus: Secondary | ICD-10-CM

## 2016-01-23 DIAGNOSIS — E039 Hypothyroidism, unspecified: Secondary | ICD-10-CM | POA: Diagnosis present

## 2016-01-23 DIAGNOSIS — Z89421 Acquired absence of other right toe(s): Secondary | ICD-10-CM

## 2016-01-23 DIAGNOSIS — Z8619 Personal history of other infectious and parasitic diseases: Secondary | ICD-10-CM

## 2016-01-23 DIAGNOSIS — Z9981 Dependence on supplemental oxygen: Secondary | ICD-10-CM | POA: Diagnosis not present

## 2016-01-23 DIAGNOSIS — K219 Gastro-esophageal reflux disease without esophagitis: Secondary | ICD-10-CM | POA: Diagnosis present

## 2016-01-23 DIAGNOSIS — I959 Hypotension, unspecified: Secondary | ICD-10-CM | POA: Diagnosis present

## 2016-01-23 DIAGNOSIS — E785 Hyperlipidemia, unspecified: Secondary | ICD-10-CM | POA: Diagnosis present

## 2016-01-23 DIAGNOSIS — Z7902 Long term (current) use of antithrombotics/antiplatelets: Secondary | ICD-10-CM

## 2016-01-23 DIAGNOSIS — R4182 Altered mental status, unspecified: Secondary | ICD-10-CM

## 2016-01-23 DIAGNOSIS — Z66 Do not resuscitate: Secondary | ICD-10-CM | POA: Diagnosis present

## 2016-01-23 DIAGNOSIS — I5032 Chronic diastolic (congestive) heart failure: Secondary | ICD-10-CM | POA: Diagnosis present

## 2016-01-23 DIAGNOSIS — Z951 Presence of aortocoronary bypass graft: Secondary | ICD-10-CM

## 2016-01-23 DIAGNOSIS — I252 Old myocardial infarction: Secondary | ICD-10-CM

## 2016-01-23 DIAGNOSIS — L89152 Pressure ulcer of sacral region, stage 2: Secondary | ICD-10-CM | POA: Diagnosis present

## 2016-01-23 DIAGNOSIS — Z87891 Personal history of nicotine dependence: Secondary | ICD-10-CM

## 2016-01-23 DIAGNOSIS — G9341 Metabolic encephalopathy: Secondary | ICD-10-CM | POA: Diagnosis not present

## 2016-01-23 DIAGNOSIS — I953 Hypotension of hemodialysis: Secondary | ICD-10-CM | POA: Diagnosis present

## 2016-01-23 DIAGNOSIS — E10621 Type 1 diabetes mellitus with foot ulcer: Secondary | ICD-10-CM | POA: Diagnosis present

## 2016-01-23 DIAGNOSIS — Z79899 Other long term (current) drug therapy: Secondary | ICD-10-CM | POA: Diagnosis not present

## 2016-01-23 DIAGNOSIS — E10649 Type 1 diabetes mellitus with hypoglycemia without coma: Secondary | ICD-10-CM | POA: Diagnosis present

## 2016-01-23 DIAGNOSIS — I272 Other secondary pulmonary hypertension: Secondary | ICD-10-CM | POA: Diagnosis present

## 2016-01-23 DIAGNOSIS — Y732 Prosthetic and other implants, materials and accessory gastroenterology and urology devices associated with adverse incidents: Secondary | ICD-10-CM | POA: Diagnosis present

## 2016-01-23 DIAGNOSIS — R401 Stupor: Secondary | ICD-10-CM | POA: Diagnosis not present

## 2016-01-23 DIAGNOSIS — L97429 Non-pressure chronic ulcer of left heel and midfoot with unspecified severity: Secondary | ICD-10-CM | POA: Diagnosis present

## 2016-01-23 DIAGNOSIS — Z8249 Family history of ischemic heart disease and other diseases of the circulatory system: Secondary | ICD-10-CM | POA: Diagnosis not present

## 2016-01-23 DIAGNOSIS — E1040 Type 1 diabetes mellitus with diabetic neuropathy, unspecified: Secondary | ICD-10-CM | POA: Diagnosis present

## 2016-01-23 DIAGNOSIS — E1022 Type 1 diabetes mellitus with diabetic chronic kidney disease: Secondary | ICD-10-CM | POA: Diagnosis present

## 2016-01-23 DIAGNOSIS — R531 Weakness: Secondary | ICD-10-CM

## 2016-01-23 DIAGNOSIS — J9610 Chronic respiratory failure, unspecified whether with hypoxia or hypercapnia: Secondary | ICD-10-CM | POA: Diagnosis present

## 2016-01-23 DIAGNOSIS — Z992 Dependence on renal dialysis: Secondary | ICD-10-CM

## 2016-01-23 DIAGNOSIS — Z7982 Long term (current) use of aspirin: Secondary | ICD-10-CM

## 2016-01-23 DIAGNOSIS — M62838 Other muscle spasm: Secondary | ICD-10-CM | POA: Diagnosis not present

## 2016-01-23 DIAGNOSIS — N186 End stage renal disease: Secondary | ICD-10-CM | POA: Diagnosis present

## 2016-01-23 DIAGNOSIS — Z888 Allergy status to other drugs, medicaments and biological substances status: Secondary | ICD-10-CM

## 2016-01-23 DIAGNOSIS — Z880 Allergy status to penicillin: Secondary | ICD-10-CM | POA: Diagnosis not present

## 2016-01-23 DIAGNOSIS — E861 Hypovolemia: Secondary | ICD-10-CM | POA: Diagnosis present

## 2016-01-23 DIAGNOSIS — I120 Hypertensive chronic kidney disease with stage 5 chronic kidney disease or end stage renal disease: Secondary | ICD-10-CM | POA: Diagnosis present

## 2016-01-23 DIAGNOSIS — N390 Urinary tract infection, site not specified: Secondary | ICD-10-CM | POA: Diagnosis present

## 2016-01-23 DIAGNOSIS — N2581 Secondary hyperparathyroidism of renal origin: Secondary | ICD-10-CM | POA: Diagnosis present

## 2016-01-23 DIAGNOSIS — D631 Anemia in chronic kidney disease: Secondary | ICD-10-CM | POA: Diagnosis present

## 2016-01-23 DIAGNOSIS — M069 Rheumatoid arthritis, unspecified: Secondary | ICD-10-CM | POA: Diagnosis present

## 2016-01-23 DIAGNOSIS — R5383 Other fatigue: Secondary | ICD-10-CM

## 2016-01-23 DIAGNOSIS — M79659 Pain in unspecified thigh: Secondary | ICD-10-CM

## 2016-01-23 LAB — COMPREHENSIVE METABOLIC PANEL
ALBUMIN: 2.1 g/dL — AB (ref 3.5–5.0)
ALK PHOS: 515 U/L — AB (ref 38–126)
ALT: 14 U/L — ABNORMAL LOW (ref 17–63)
ANION GAP: 11 (ref 5–15)
AST: 26 U/L (ref 15–41)
BILIRUBIN TOTAL: 1.2 mg/dL (ref 0.3–1.2)
BUN: 32 mg/dL — AB (ref 6–20)
CALCIUM: 8.4 mg/dL — AB (ref 8.9–10.3)
CO2: 26 mmol/L (ref 22–32)
Chloride: 96 mmol/L — ABNORMAL LOW (ref 101–111)
Creatinine, Ser: 4.03 mg/dL — ABNORMAL HIGH (ref 0.61–1.24)
GFR calc Af Amer: 19 mL/min — ABNORMAL LOW (ref >60)
GFR, EST NON AFRICAN AMERICAN: 16 mL/min — AB (ref >60)
GLUCOSE: 62 mg/dL — AB (ref 65–99)
Potassium: 4.2 mmol/L (ref 3.5–5.1)
Sodium: 133 mmol/L — ABNORMAL LOW (ref 135–145)
TOTAL PROTEIN: 7 g/dL (ref 6.5–8.1)

## 2016-01-23 LAB — URINALYSIS COMPLETE WITH MICROSCOPIC (ARMC ONLY)
BILIRUBIN URINE: NEGATIVE
GLUCOSE, UA: 50 mg/dL — AB
Ketones, ur: NEGATIVE mg/dL
Nitrite: NEGATIVE
Protein, ur: 100 mg/dL — AB
SQUAMOUS EPITHELIAL / LPF: NONE SEEN
Specific Gravity, Urine: 1.006 (ref 1.005–1.030)
pH: 5 (ref 5.0–8.0)

## 2016-01-23 LAB — CBC
HEMATOCRIT: 26.5 % — AB (ref 40.0–52.0)
HEMOGLOBIN: 8.3 g/dL — AB (ref 13.0–18.0)
MCH: 28.3 pg (ref 26.0–34.0)
MCHC: 31.3 g/dL — ABNORMAL LOW (ref 32.0–36.0)
MCV: 90.4 fL (ref 80.0–100.0)
Platelets: 99 10*3/uL — ABNORMAL LOW (ref 150–440)
RBC: 2.94 MIL/uL — ABNORMAL LOW (ref 4.40–5.90)
RDW: 18.6 % — AB (ref 11.5–14.5)
WBC: 8 10*3/uL (ref 3.8–10.6)

## 2016-01-23 LAB — CREATININE, SERUM
CREATININE: 4.1 mg/dL — AB (ref 0.61–1.24)
GFR, EST AFRICAN AMERICAN: 19 mL/min — AB (ref >60)
GFR, EST NON AFRICAN AMERICAN: 16 mL/min — AB (ref >60)

## 2016-01-23 LAB — CBC WITH DIFFERENTIAL/PLATELET
BASOS PCT: 1 %
Basophils Absolute: 0.1 10*3/uL (ref 0–0.1)
Eosinophils Absolute: 0.3 10*3/uL (ref 0–0.7)
Eosinophils Relative: 5 %
HEMATOCRIT: 28.1 % — AB (ref 40.0–52.0)
HEMOGLOBIN: 8.9 g/dL — AB (ref 13.0–18.0)
LYMPHS PCT: 8 %
Lymphs Abs: 0.5 10*3/uL — ABNORMAL LOW (ref 1.0–3.6)
MCH: 28.4 pg (ref 26.0–34.0)
MCHC: 31.7 g/dL — AB (ref 32.0–36.0)
MCV: 89.7 fL (ref 80.0–100.0)
MONOS PCT: 9 %
Monocytes Absolute: 0.6 10*3/uL (ref 0.2–1.0)
Neutro Abs: 5.5 10*3/uL (ref 1.4–6.5)
Neutrophils Relative %: 77 %
Platelets: 106 10*3/uL — ABNORMAL LOW (ref 150–440)
RBC: 3.13 MIL/uL — ABNORMAL LOW (ref 4.40–5.90)
RDW: 18.6 % — ABNORMAL HIGH (ref 11.5–14.5)
WBC: 7 10*3/uL (ref 3.8–10.6)

## 2016-01-23 LAB — TROPONIN I
TROPONIN I: 0.15 ng/mL — AB (ref <0.031)
Troponin I: 0.12 ng/mL — ABNORMAL HIGH (ref <0.031)
Troponin I: 0.11 ng/mL — ABNORMAL HIGH (ref <0.031)

## 2016-01-23 LAB — GLUCOSE, CAPILLARY
GLUCOSE-CAPILLARY: 122 mg/dL — AB (ref 65–99)
GLUCOSE-CAPILLARY: 154 mg/dL — AB (ref 65–99)
GLUCOSE-CAPILLARY: 32 mg/dL — AB (ref 65–99)
GLUCOSE-CAPILLARY: 44 mg/dL — AB (ref 65–99)
GLUCOSE-CAPILLARY: 78 mg/dL (ref 65–99)
Glucose-Capillary: 32 mg/dL — CL (ref 65–99)
Glucose-Capillary: 115 mg/dL — ABNORMAL HIGH (ref 65–99)

## 2016-01-23 LAB — LACTIC ACID, PLASMA
LACTIC ACID, VENOUS: 1 mmol/L (ref 0.5–2.0)
LACTIC ACID, VENOUS: 0.8 mmol/L (ref 0.5–2.0)

## 2016-01-23 LAB — HEMOGLOBIN A1C: HEMOGLOBIN A1C: 6.8 % — AB (ref 4.0–6.0)

## 2016-01-23 MED ORDER — PREGABALIN 75 MG PO CAPS
75.0000 mg | ORAL_CAPSULE | Freq: Two times a day (BID) | ORAL | Status: DC
  Administered 2016-01-23 – 2016-02-03 (×21): 75 mg via ORAL
  Filled 2016-01-23 (×22): qty 1

## 2016-01-23 MED ORDER — ALUM & MAG HYDROXIDE-SIMETH 200-200-20 MG/5ML PO SUSP: 30.0000 mL | Freq: Four times a day (QID) | ORAL | Status: DC | PRN

## 2016-01-23 MED ORDER — OMEGA-3-ACID ETHYL ESTERS 1 G PO CAPS
1.0000 g | ORAL_CAPSULE | Freq: Every day | ORAL | Status: DC
  Administered 2016-01-24 – 2016-02-03 (×9): 1 g via ORAL
  Filled 2016-01-23 (×10): qty 1

## 2016-01-23 MED ORDER — ACETAMINOPHEN 500 MG PO TABS
1000.0000 mg | ORAL_TABLET | Freq: Four times a day (QID) | ORAL | Status: DC | PRN
  Administered 2016-01-26: 1000 mg via ORAL
  Filled 2016-01-23: qty 2

## 2016-01-23 MED ORDER — METOLAZONE 5 MG PO TABS
5.0000 mg | ORAL_TABLET | Freq: Every day | ORAL | Status: DC
  Administered 2016-01-23 – 2016-01-29 (×7): 5 mg via ORAL
  Filled 2016-01-23 (×6): qty 1

## 2016-01-23 MED ORDER — CLOPIDOGREL BISULFATE 75 MG PO TABS
75.0000 mg | ORAL_TABLET | Freq: Every day | ORAL | Status: DC
  Administered 2016-01-23 – 2016-02-03 (×10): 75 mg via ORAL
  Filled 2016-01-23 (×11): qty 1

## 2016-01-23 MED ORDER — NYSTATIN 100000 UNIT/GM EX POWD
Freq: Two times a day (BID) | CUTANEOUS | Status: DC
  Administered 2016-01-23 – 2016-01-27 (×7): via TOPICAL
  Filled 2016-01-23: qty 15

## 2016-01-23 MED ORDER — ONDANSETRON HCL 4 MG PO TABS: 4.0000 mg | ORAL_TABLET | Freq: Four times a day (QID) | ORAL | Status: DC | PRN

## 2016-01-23 MED ORDER — ROSUVASTATIN CALCIUM 10 MG PO TABS
20.0000 mg | ORAL_TABLET | Freq: Every day | ORAL | Status: DC
  Administered 2016-01-23 – 2016-02-02 (×11): 20 mg via ORAL
  Filled 2016-01-23 (×12): qty 2

## 2016-01-23 MED ORDER — ALUM & MAG HYDROXIDE-SIMETH 200-200-20 MG/5ML PO SUSP
30.0000 mL | ORAL | Status: DC | PRN
  Administered 2016-02-03: 30 mL via ORAL
  Filled 2016-01-23: qty 30

## 2016-01-23 MED ORDER — LEVOTHYROXINE SODIUM 75 MCG PO TABS
75.0000 ug | ORAL_TABLET | Freq: Every day | ORAL | Status: DC
  Administered 2016-01-24 – 2016-02-03 (×9): 75 ug via ORAL
  Filled 2016-01-23 (×11): qty 1

## 2016-01-23 MED ORDER — MIDODRINE HCL 5 MG PO TABS
10.0000 mg | ORAL_TABLET | Freq: Every day | ORAL | Status: DC | PRN
  Administered 2016-01-28 – 2016-02-01 (×2): 10 mg via ORAL
  Filled 2016-01-23 (×2): qty 2

## 2016-01-23 MED ORDER — METRONIDAZOLE 500 MG PO TABS
500.0000 mg | ORAL_TABLET | Freq: Two times a day (BID) | ORAL | Status: DC
  Administered 2016-01-23 – 2016-01-24 (×2): 500 mg via ORAL
  Filled 2016-01-23 (×2): qty 1

## 2016-01-23 MED ORDER — CITALOPRAM HYDROBROMIDE 20 MG PO TABS
20.0000 mg | ORAL_TABLET | Freq: Every day | ORAL | Status: DC
  Administered 2016-01-23 – 2016-01-29 (×7): 20 mg via ORAL
  Filled 2016-01-23 (×8): qty 1

## 2016-01-23 MED ORDER — SENNOSIDES-DOCUSATE SODIUM 8.6-50 MG PO TABS
1.0000 | ORAL_TABLET | Freq: Every evening | ORAL | Status: DC | PRN
  Administered 2016-01-31 – 2016-02-02 (×2): 1 via ORAL
  Filled 2016-01-23 (×2): qty 1

## 2016-01-23 MED ORDER — DEXTROSE 5 % IV SOLN
1.0000 g | INTRAVENOUS | Status: DC
  Administered 2016-01-24 – 2016-01-27 (×4): 1 g via INTRAVENOUS
  Filled 2016-01-23 (×5): qty 10

## 2016-01-23 MED ORDER — DEXTROSE 5 % IV SOLN
2.0000 g | Freq: Once | INTRAVENOUS | Status: AC
  Administered 2016-01-23: 2 g via INTRAVENOUS
  Filled 2016-01-23: qty 2

## 2016-01-23 MED ORDER — TRAZODONE HCL 50 MG PO TABS
50.0000 mg | ORAL_TABLET | Freq: Every day | ORAL | Status: DC
  Administered 2016-01-23 – 2016-01-30 (×7): 50 mg via ORAL
  Filled 2016-01-23 (×8): qty 1

## 2016-01-23 MED ORDER — ALBUTEROL SULFATE (2.5 MG/3ML) 0.083% IN NEBU: 3.0000 mL | INHALATION_SOLUTION | Freq: Four times a day (QID) | RESPIRATORY_TRACT | Status: DC | PRN

## 2016-01-23 MED ORDER — RISAQUAD PO CAPS
1.0000 | ORAL_CAPSULE | Freq: Every day | ORAL | Status: DC
  Administered 2016-01-23 – 2016-02-03 (×10): 1 via ORAL
  Filled 2016-01-23 (×11): qty 1

## 2016-01-23 MED ORDER — SEVELAMER CARBONATE 800 MG PO TABS
800.0000 mg | ORAL_TABLET | Freq: Three times a day (TID) | ORAL | Status: DC
  Administered 2016-01-23 – 2016-02-03 (×23): 800 mg via ORAL
  Filled 2016-01-23 (×26): qty 1

## 2016-01-23 MED ORDER — SODIUM CHLORIDE 0.9% FLUSH
3.0000 mL | Freq: Two times a day (BID) | INTRAVENOUS | Status: DC
  Administered 2016-01-23 – 2016-02-03 (×22): 3 mL via INTRAVENOUS

## 2016-01-23 MED ORDER — VITAMIN E 45 MG (100 UNIT) PO CAPS
100.0000 [IU] | ORAL_CAPSULE | Freq: Every day | ORAL | Status: DC
  Administered 2016-01-24 – 2016-02-03 (×9): 100 [IU] via ORAL
  Filled 2016-01-23 (×10): qty 1

## 2016-01-23 MED ORDER — ASPIRIN EC 81 MG PO TBEC
81.0000 mg | DELAYED_RELEASE_TABLET | Freq: Every day | ORAL | Status: DC
  Administered 2016-01-23 – 2016-02-03 (×10): 81 mg via ORAL
  Filled 2016-01-23 (×11): qty 1

## 2016-01-23 MED ORDER — ACETAMINOPHEN 650 MG RE SUPP: 650.0000 mg | Freq: Four times a day (QID) | RECTAL | Status: DC | PRN

## 2016-01-23 MED ORDER — CINACALCET HCL 30 MG PO TABS
30.0000 mg | ORAL_TABLET | Freq: Every day | ORAL | Status: DC
  Administered 2016-01-23 – 2016-01-26 (×4): 30 mg via ORAL
  Filled 2016-01-23 (×4): qty 1

## 2016-01-23 MED ORDER — SODIUM CHLORIDE 0.9 % IV SOLN
INTRAVENOUS | Status: DC
  Administered 2016-01-23 – 2016-01-25 (×2): via INTRAVENOUS

## 2016-01-23 MED ORDER — ENOXAPARIN SODIUM 40 MG/0.4ML ~~LOC~~ SOLN
40.0000 mg | SUBCUTANEOUS | Status: DC
Start: 2016-01-23 — End: 2016-01-23

## 2016-01-23 MED ORDER — DHA-EPA-VITAMIN E 192-251-11 MG-MG-UNIT PO CAPS: ORAL_CAPSULE | Freq: Every day | ORAL | Status: DC

## 2016-01-23 MED ORDER — PANTOPRAZOLE SODIUM 40 MG PO TBEC
40.0000 mg | DELAYED_RELEASE_TABLET | Freq: Two times a day (BID) | ORAL | Status: DC
  Administered 2016-01-23 – 2016-02-03 (×21): 40 mg via ORAL
  Filled 2016-01-23 (×22): qty 1

## 2016-01-23 MED ORDER — NITROGLYCERIN 0.4 MG SL SUBL: 0.4000 mg | SUBLINGUAL_TABLET | SUBLINGUAL | Status: DC | PRN

## 2016-01-23 MED ORDER — DOCUSATE SODIUM 100 MG PO CAPS
100.0000 mg | ORAL_CAPSULE | Freq: Two times a day (BID) | ORAL | Status: DC
  Administered 2016-01-23 – 2016-02-02 (×20): 100 mg via ORAL
  Filled 2016-01-23 (×21): qty 1

## 2016-01-23 MED ORDER — HYDROCODONE-ACETAMINOPHEN 5-325 MG PO TABS
1.0000 | ORAL_TABLET | ORAL | Status: DC | PRN
  Administered 2016-01-23 – 2016-01-29 (×8): 2 via ORAL
  Filled 2016-01-23 (×9): qty 2

## 2016-01-23 MED ORDER — AMITRIPTYLINE HCL 25 MG PO TABS
25.0000 mg | ORAL_TABLET | Freq: Every day | ORAL | Status: DC
  Administered 2016-01-23 – 2016-01-30 (×7): 25 mg via ORAL
  Filled 2016-01-23 (×8): qty 1

## 2016-01-23 MED ORDER — ACETAMINOPHEN 325 MG PO TABS
650.0000 mg | ORAL_TABLET | Freq: Four times a day (QID) | ORAL | Status: DC | PRN
  Administered 2016-01-31 – 2016-02-02 (×5): 650 mg via ORAL
  Filled 2016-01-23 (×5): qty 2

## 2016-01-23 MED ORDER — CALCIUM CARBONATE-VITAMIN D 500-200 MG-UNIT PO TABS
1.0000 | ORAL_TABLET | Freq: Every day | ORAL | Status: DC
  Administered 2016-01-24 – 2016-02-03 (×9): 1 via ORAL
  Filled 2016-01-23 (×10): qty 1

## 2016-01-23 MED ORDER — ONDANSETRON HCL 4 MG/2ML IJ SOLN: 4.0000 mg | Freq: Four times a day (QID) | INTRAMUSCULAR | Status: DC | PRN

## 2016-01-23 MED ORDER — INSULIN ASPART 100 UNIT/ML ~~LOC~~ SOLN
0.0000 [IU] | Freq: Every day | SUBCUTANEOUS | Status: DC
  Administered 2016-01-24 – 2016-01-31 (×5): 2 [IU] via SUBCUTANEOUS
  Administered 2016-02-02: 3 [IU] via SUBCUTANEOUS
  Filled 2016-01-23: qty 2
  Filled 2016-01-23: qty 3
  Filled 2016-01-23 (×4): qty 2

## 2016-01-23 MED ORDER — HEPARIN SODIUM (PORCINE) 5000 UNIT/ML IJ SOLN
5000.0000 [IU] | Freq: Two times a day (BID) | INTRAMUSCULAR | Status: DC
  Administered 2016-01-23 – 2016-02-03 (×22): 5000 [IU] via SUBCUTANEOUS
  Filled 2016-01-23 (×22): qty 1

## 2016-01-23 MED ORDER — CHLORHEXIDINE GLUCONATE 0.12 % MT SOLN
15.0000 mL | Freq: Two times a day (BID) | OROMUCOSAL | Status: DC
  Administered 2016-01-23 – 2016-02-03 (×19): 15 mL via OROMUCOSAL
  Filled 2016-01-23 (×17): qty 15

## 2016-01-23 MED ORDER — PANTOPRAZOLE SODIUM 40 MG PO TBEC: 40.0000 mg | DELAYED_RELEASE_TABLET | Freq: Every day | ORAL | Status: DC

## 2016-01-23 MED ORDER — INSULIN ASPART 100 UNIT/ML ~~LOC~~ SOLN
0.0000 [IU] | Freq: Three times a day (TID) | SUBCUTANEOUS | Status: DC
  Administered 2016-01-24: 5 [IU] via SUBCUTANEOUS
  Administered 2016-01-24: 2 [IU] via SUBCUTANEOUS
  Administered 2016-01-24: 3 [IU] via SUBCUTANEOUS
  Administered 2016-01-25: 5 [IU] via SUBCUTANEOUS
  Administered 2016-01-25: 7 [IU] via SUBCUTANEOUS
  Administered 2016-01-25: 2 [IU] via SUBCUTANEOUS
  Administered 2016-01-28: 1 [IU] via SUBCUTANEOUS
  Administered 2016-01-29 (×2): 2 [IU] via SUBCUTANEOUS
  Administered 2016-01-29 – 2016-01-30 (×2): 1 [IU] via SUBCUTANEOUS
  Administered 2016-01-31: 2 [IU] via SUBCUTANEOUS
  Administered 2016-02-01: 1 [IU] via SUBCUTANEOUS
  Administered 2016-02-01: 3 [IU] via SUBCUTANEOUS
  Administered 2016-02-01: 2 [IU] via SUBCUTANEOUS
  Administered 2016-02-02: 5 [IU] via SUBCUTANEOUS
  Administered 2016-02-03: 2 [IU] via SUBCUTANEOUS
  Administered 2016-02-03: 3 [IU] via SUBCUTANEOUS
  Filled 2016-01-23: qty 5
  Filled 2016-01-23: qty 7
  Filled 2016-01-23: qty 5
  Filled 2016-01-23: qty 1
  Filled 2016-01-23: qty 5
  Filled 2016-01-23: qty 1
  Filled 2016-01-23: qty 2
  Filled 2016-01-23: qty 3
  Filled 2016-01-23: qty 2
  Filled 2016-01-23: qty 1
  Filled 2016-01-23: qty 5
  Filled 2016-01-23 (×2): qty 2
  Filled 2016-01-23: qty 1
  Filled 2016-01-23: qty 3
  Filled 2016-01-23 (×2): qty 2
  Filled 2016-01-23: qty 3
  Filled 2016-01-23: qty 2

## 2016-01-23 MED ORDER — HYDROCODONE-ACETAMINOPHEN 5-325 MG PO TABS: 1.0000 | ORAL_TABLET | ORAL | Status: DC | PRN

## 2016-01-23 NOTE — Consult Note (Signed)
Nursing staff able to exchange SP tube without difficulty.  No need for urologic consultation at this time.

## 2016-01-23 NOTE — Progress Notes (Signed)
Pharmacy note - Anticoagulation  Patient with orders for enoxaparin 40mg  SQ Q24H for VTE prophylaxis.  Will change to heparin 5000 SQ Q12H per policy for ESRD on HD  , PharmD Clinical Pharmacist  01/23/2016 1:25 PM

## 2016-01-23 NOTE — Progress Notes (Signed)
Called Dr. Juliene Pina for an order for nystatin powder, also patient has had three loose BMs, and family was concerned about patient's FSBS not being checked as often.  She gave me a verbal order for nystatin powder, cdiff collection and check FSBS q4 hrs

## 2016-01-23 NOTE — Progress Notes (Signed)
Pharmacy Antibiotic Note  Taylor [name] is a 45 y.o. male admitted on 01/23/2016 with UTI.  Pharmacy has been consulted for ceftriaxone dosing.  Plan: Patient with orders for Ceftriaxone 2 gm IV once.  Will transition patient to Ceftriaxone 1 gm IV q24h for treatment of UTI.  No renal adjustments warranted.   Height: 6\' 3"  (190.5 cm) Weight: 180 lb 1.6 oz (81.693 kg) IBW/kg (Calculated) : 84.5  Temp (24hrs), Avg:97.5 F (36.4 C), Min:97.3 F (36.3 C), Max:97.6 F (36.4 C)   Recent Labs Lab 01/23/16 0842 01/23/16 1143  WBC 7.0  --   CREATININE 4.03*  --   LATICACIDVEN 1.0 0.8    Estimated Creatinine Clearance: 26.5 mL/min (by C-G formula based on Cr of 4.03).    Allergies  Allergen Reactions  . Amoxicillin-Pot Clavulanate Nausea And Vomiting and Itching  . 2nd Skin Quick Heal Other (See Comments)  . Rifampin Nausea And Vomiting  . Tape Other (See Comments)    Antimicrobials this admission: 3/16 Ceftriaxone>>    Microbiology results: 3/16 BCx: pending 3/16 UCx: pending   Thank you for allowing pharmacy to be a part of this patient's care.  Phillippe Orlick G 01/23/2016 12:59 PM

## 2016-01-23 NOTE — H&P (Signed)
[facility] [place] at [facility]   PATIENT NAME: Taylor [name]    MR#:  [id number]  DATE OF BIRTH:  15-Jun-1971  DATE OF ADMISSION:  01/23/2016  PRIMARY CARE PHYSICIAN: [name], MD   REQUESTING/REFERRING PHYSICIAN: [name]  CHIEF COMPLAINT:  hypotension  HISTORY OF PRESENT ILLNESS:  [name]  is a 45 y.o. male with a known history of  ESRD on hemodialysis, diabetic neuropathy, CAD and urethrocutaneous fistula with history of penile prosthesis with infection and scrotal abscess requiring explorative surgery is status post suprapubic catheter who had it replacedin early March with [facility] who presents from dialysis with hypotension. The patient also reports feeling weak over the past few days. He was recently hospitalized for Escherichia coli urinary tract infection with similar symptoms.  PAST MEDICAL HISTORY:   Past Medical History  Diagnosis Date  . End stage renal disease on dialysis (HCC)     LUE fistula  . Type I diabetes mellitus (HCC)     a. 03/2014 admitted with HNK to [facility]. b. TTS  . Diabetic neuropathy (HCC)     severe, s/p multiple toe amputation  . Hypothyroidism   . Hypertension   . Hyperlipidemia   . H/O hiatal hernia   . GERD (gastroesophageal reflux disease)   . Anxiety   . Sebaceous cyst     side of neck  . Pneumonia     2010  . Anemia     a. req PRBC's 2011.  [name] PAD (peripheral artery disease) (HCC)     a. s/p amputation of toes on the right;  b. left LE claudication.  . Coronary artery disease     a. s/p MI;  b. 10/2009 CABG x 3 @ [facility]: LIMA->LAD, VG->OM3, VG->RPDA; c. 11/2010 Cath 3/3 patent grafts;  d 12/2012 Cath: LM 30d, LAD 85p, D1 70, D2 90, LCX 40ost, OM2 100, RCA 90p, 174m, L->LAD ok, VG->OM3 ok, VG->RPDA 30, EF 50%->Med Rx.  . Cataract     right  . Valvular disease     a. 11/2012 Echo: EF 55-60%, mild LVH, mild MR, mild bi-atrial enlargement, mild-mod TR, PASP ; b. echo  03/2014: EF 50-55%, nl WM, select images concerning for bicuspid aortic valve w/ nl thicknes of leaflets, mild MR, mild bi-atrial dilatation, RV mild dilatation - wall thickness nl, mod TR - select images appears to be mod to sev, PASP at least mod elevated.  . Diastolic CHF (HCC)     see echo above  . Depression   . Arthritis     rheumatoid arthritis   . Myocardial infarction (HCC) 2010  . Pulmonary HTN (HCC)     a. continuation of 03/2014 echo PASP @ least mod elevated. RVSP 52 mm Hg. Parasternal long axis estimated @ 85 mm Hg  . Scrotal abscess     a. s/p multiple I&D  . Penile abscess     a. s/p multiple I&D  . Hematemesis     a. EGD 2016 with LA Grade C esophagitis, continued on Protonix   . Chronic respiratory failure (HCC)     a. on 3L oxygen via nasal cannula  . Anxiety   . Acute delirium   . Sepsis (HCC)   . 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 MYOCARDIAL INFARCTION, HX OF 01/26/2011    Qualifier: Diagnosis of  By: [name] MD, [name]    . HTN (hypertension) 02/11/2011  . Pulmonary hypertension associated with end stage renal disease on dialysis (  HCC) 04/16/2014  . Hyperkalemia   . Hypothyroidism   . Osteomyelitis (HCC)     PAST SURGICAL HISTORY:   Past Surgical History  Procedure Laterality Date  . Dialysis fistula creation      left upper arm fistula  . Amputation      TOES ON BOTH FEET  . Abscess drainage  Behind right ear/occipital scalp  . Skin graft    . Eye surgery  1999  . Coronary artery bypass graft  10/2009    [facility] (Dr. Katrinka Blazing)  . Foot amputation    . Cardiac catheterization    . Cardiac catheterization  2/14    [facility]: severe 3 vessel CAD with patent grafts, RHC: moderately elevated PCW and pulmonary hypertension  . Penile prosthesis implant N/A 07/24/2014    Procedure: SALINE PENILE INJECTION WITH DISSECTION OF CORPORA ,  IMPLANTATION OF COLOPLAST PENILE PROTHESIS INFLATABLE;  Surgeon: [name], MD;  Location: [facility] ORS;  Service: Urology;  Laterality:  N/A;  . Removal of penile prosthesis N/A 08/22/2014    Procedure: EXPLANT OF INFECTED  PENILE PROSTHESIS;  Surgeon: [name], MD;  Location: [facility] ORS;  Service: Urology;  Laterality: N/A;  . Irrigation and debridement abscess Bilateral 12/14/2014    Procedure: Bilateral Corporal Irrigation with Cultures and Drainage, Penrose Drain Insertion;  Surgeon: [name], MD;  Location: [facility] OR;  Service: Urology;  Laterality: Bilateral;  . Scrotal exploration N/A 02/03/2015    Procedure: IRRIGATION AND DEBRIDEMENT SCROTAL/PENILE ABSCESS;  Surgeon: [name], MD;  Location: [facility] OR;  Service: Urology;  Laterality: N/A;  . Cystoscopy N/A 02/03/2015    Procedure: CYSTOSCOPY;  Surgeon: [name], MD;  Location: [facility] OR;  Service: Urology;  Laterality: N/A;  . Esophagogastroduodenoscopy N/A 03/18/2015    Procedure: ESOPHAGOGASTRODUODENOSCOPY (EGD);  Surgeon: [name], MD;  Location: [facility] ENDOSCOPY;  Service: Endoscopy;  Laterality: N/A;  . Cardiac catheterization N/A 05/27/2015    Procedure: Right Heart Cath;  Surgeon: [name], MD;  Location: [facility] INVASIVE CV LAB;  Service: Cardiovascular;  Laterality: N/A;    SOCIAL HISTORY:   Social History  Substance Use Topics  . Smoking status: Never Smoker   . Smokeless tobacco: Former Neurosurgeon    Types: Chew    Quit date: 08/23/1995  . Alcohol Use: No     Comment: occasional beer    FAMILY HISTORY:   Family History  Problem Relation Age of Onset  . Heart disease Other   . Hypertension Other   . Hypertension Mother   . Heart disease Mother   . Diabetes Mother   . Birth defects Paternal Uncle     unaware  . Birth defects Paternal Grandmother     breast  . Kidney disease Neg Hx   . Prostate cancer Neg Hx     DRUG ALLERGIES:   Allergies  Allergen Reactions  . Amoxicillin-Pot Clavulanate Nausea And Vomiting and Itching  . 2nd Skin Quick Heal Other (See Comments)  . Rifampin Nausea And Vomiting  . Tape Other (See  Comments)     REVIEW OF SYSTEMS:  CONSTITUTIONAL: No fever,++ fatigue and generalized weakness.  EYES: No blurred or double vision.  EARS, NOSE, AND THROAT: No tinnitus or ear pain.  RESPIRATORY: No cough, shortness of breath, wheezing or hemoptysis.  CARDIOVASCULAR: No chest pain, orthopnea, edema.  GASTROINTESTINAL: No nausea, vomiting, diarrhea or abdominal pain.  GENITOURINARY: No dysuria, hematuria.  ENDOCRINE: No polyuria, nocturia, he has suprapubic catheter HEMATOLOGY: No anemia, easy  bruising or bleeding SKIN: No rash or lesion. MUSCULOSKELETAL: No joint pain or arthritis.   NEUROLOGIC: No tingling, numbness, weakness.  PSYCHIATRY: No anxiety or depression.   MEDICATIONS AT HOME:   Prior to Admission medications   Medication Sig Start Date End Date Taking? Authorizing Provider  acetaminophen (TYLENOL) 500 MG tablet Take 1,000 mg by mouth every 6 (six) hours as needed for mild pain or headache.    Yes Historical Provider, MD  alum & mag hydroxide-simeth (MAALOX PLUS) 400-400-40 MG/5ML suspension Take 30 mLs by mouth every 4 (four) hours as needed for indigestion.    Yes Historical Provider, MD  amitriptyline (ELAVIL) 25 MG tablet Take 1 tablet (25 mg total) by mouth at bedtime. 08/21/14  Yes Dianne Dun, MD  aspirin EC 81 MG tablet Take 81 mg by mouth daily.   Yes Historical Provider, MD  calcium-vitamin D (OSCAL WITH D) 250-125 MG-UNIT tablet Take 1 tablet by mouth daily.   Yes Historical Provider, MD  carvedilol (COREG) 25 MG tablet Take 1 tablet (25 mg total) by mouth 2 (two) times daily with a meal. 08/28/15  Yes [name], MD  chlorhexidine (PERIDEX) 0.12 % solution 15 mLs by Mouth Rinse route 2 (two) times daily. 01/16/16  Yes Katharina Caper, MD  cinacalcet (SENSIPAR) 30 MG tablet Take 30 mg by mouth daily.   Yes Historical Provider, MD  citalopram (CELEXA) 20 MG tablet Take 20 mg by mouth daily.   Yes Historical Provider, MD  clopidogrel (PLAVIX) 75 MG tablet Take  1 tablet (75 mg total) by mouth daily. 09/11/15  Yes Dianne Dun, MD  DHA-EPA-VITAMIN E PO Take 1 capsule by mouth daily.   Yes Historical Provider, MD  docusate sodium (COLACE) 100 MG capsule Take 1 capsule (100 mg total) by mouth 2 (two) times daily. 01/16/16  Yes Katharina Caper, MD  furosemide (LASIX) 80 MG tablet Take 80 mg by mouth 2 (two) times daily.   Yes Historical Provider, MD  HYDROcodone-acetaminophen (NORCO/VICODIN) 5-325 MG tablet Take 1-2 tablets by mouth every 4 (four) hours as needed for moderate pain. 01/16/16  Yes Katharina Caper, MD  insulin aspart (NOVOLOG) 100 UNIT/ML injection Inject 0-7 Units into the skin 4 (four) times daily -  before meals and at bedtime. Patient taking differently: Inject 0-6 Units into the skin 4 (four) times daily -  before meals and at bedtime. Per sliding scale: blood sugar 201-250 give 2 units, blood sugar 251-300 give 3 units, blood sugar 301-350 give 4 units, blood sugar 351-400 give 5 units, blood sugar 401-450 give 6 units. 03/12/15  Yes Altamese Dilling, MD  insulin detemir (LEVEMIR) 100 UNIT/ML injection Inject 12 Units into the skin at bedtime.   Yes Historical Provider, MD  levothyroxine (SYNTHROID, LEVOTHROID) 75 MCG tablet Take 1 tablet (75 mcg total) by mouth daily before breakfast. 07/26/13  Yes Dianne Dun, MD  metolazone (ZAROXOLYN) 5 MG tablet Take 5 mg by mouth daily.   Yes Historical Provider, MD  metronidazole (FLAGYL) 375 MG capsule Take 375 mg by mouth 2 (two) times daily.   Yes Historical Provider, MD  midodrine (PROAMATINE) 10 MG tablet Take 1 tablet (10 mg total) by mouth daily as needed (Administer prior to dialysis). 01/16/16  Yes Katharina Caper, MD  nitroGLYCERIN (NITROSTAT) 0.4 MG SL tablet Place 0.4 mg under the tongue every 5 (five) minutes as needed for chest pain.   Yes Historical Provider, MD  Omega-3 Fatty Acids (FISH OIL) 1000 MG CAPS Take  1 capsule by mouth daily.   Yes Historical Provider, MD  omeprazole (PRILOSEC) 40  MG capsule Take 40 mg by mouth daily.   Yes Historical Provider, MD  pantoprazole (PROTONIX) 40 MG tablet Take 40 mg by mouth 2 (two) times daily.   Yes Historical Provider, MD  pregabalin (LYRICA) 75 MG capsule Take 75 mg by mouth 2 (two) times daily.    Yes Historical Provider, MD  PROAIR HFA 108 (90 Base) MCG/ACT inhaler Inhale 2 puffs into the lungs every 6 (six) hours as needed.  10/08/15  Yes Historical Provider, MD  Probiotic CAPS Take 1 capsule by mouth daily. 10/15/15  Yes Enid Baas, MD  rosuvastatin (CRESTOR) 20 MG tablet Take 1 tablet (20 mg total) by mouth at bedtime. 12/13/12  Yes Dianne Dun, MD  sevelamer carbonate (RENVELA) 800 MG tablet Take 800 mg by mouth 3 (three) times daily with meals.    Yes Historical Provider, MD  traZODone (DESYREL) 50 MG tablet Take 50 mg by mouth at bedtime.   Yes Historical Provider, MD      VITAL SIGNS:  Blood pressure 98/63, pulse 66, temperature 97.3 F (36.3 C), temperature source Axillary, resp. rate 18, SpO2 100 %.  PHYSICAL EXAMINATION:  GENERAL:  45 y.o.-year-old patient lying in the bed with no acute distress.  EYES: Pupils equal, round, reactive to light and accommodation. No scleral icterus. Extraocular muscles intact.  HEENT: Head atraumatic, normocephalic. Oropharynx and nasopharynx clear.  NECK:  Supple, no jugular venous distention. No thyroid enlargement, no tenderness.  LUNGS: Normal breath sounds bilaterally, no wheezing, rales,rhonchi or crepitation. No use of accessory muscles of respiration.  CARDIOVASCULAR: S1, S2 normal. No murmurs, rubs, or gallops.  ABDOMEN: Soft, nontender, nondistended. Bowel sounds present. No organomegaly or mass.  suprapubic catheter placed  EXTREMITIES: No pedal edema, cyanosis, or clubbing.  Lower extreme strength is 3 out of 5 upper extremity 4-5 bilaterally  NEUROLOGIC: Cranial nerves II through XII are grossly intact. No focal deficits. PSYCHIATRIC: The patient is alert and oriented x 3.   SKIN: No obvious rash, lesion, or ulcer.   LABORATORY PANEL:   CBC  Recent Labs Lab 01/23/16 0842  WBC 7.0  HGB 8.9*  HCT 28.1*  PLT 106*   ------------------------------------------------------------------------------------------------------------------  Chemistries   Recent Labs Lab 01/23/16 0842  NA 133*  K 4.2  CL 96*  CO2 26  GLUCOSE 62*  BUN 32*  CREATININE 4.03*  CALCIUM 8.4*  AST 26  ALT 14*  ALKPHOS 515*  BILITOT 1.2   ------------------------------------------------------------------------------------------------------------------  Cardiac Enzymes  Recent Labs Lab 01/23/16 0842  TROPONINI 0.12*   ------------------------------------------------------------------------------------------------------------------  RADIOLOGY:  Dg Chest 1 View  01/23/2016  CLINICAL DATA:  Hypotension and weakness EXAM: CHEST 1 VIEW COMPARISON:  01/08/2016 FINDINGS: Cardiac shadow is stable. Postoperative changes are again seen. Vascular congestion with mild interstitial edema is noted. A left arm vascular stent is again noted and stable. IMPRESSION: Changes consistent with congestive failure likely related to a degree of volume overload. No focal confluent infiltrate is noted. Electronically Signed   By: Alcide Clever M.D.   On: 01/23/2016 08:43    EKG:   Sinus rhythm no ST elevation or depression   IMPRESSION AND PLAN:    45 year old male with a history of ESRD on hemodialysis and suprapubic catheter who presents with hypotension and weakness.    1. Hypotension: This is due to urinary tract infection. Last urine culture was positive for Escherichia coli sensitive to Rocephin. Treat urinary  tract infection with Rocephin. Very low dose IV fluids for hypotension. Keep MAP greater than 65. Follow up on urine and blood cultures.  2. Urinary tract infection with suprapubic catheter: As mentioned patient was recently diagnosed with Escherichia coli UTI. He presents with  urinary tract infection. Follow up in urine cultures and continue Rocephin. Urology consultation. Patient has to P catheter replaced approximately 2 weeks ago however this is his second urinary tract infection in the past 10 days.  3. ESRD on hemodialysis: Nephrology has been consulted for dialysis when stable. Continue Renvela. 4. History of CAD/CABG: Continue Plavix, aspirin and statin. Hold Lasix and Coreg due to hypotension.   5. Diabetes: Patient's blood sugar is low. Hold Levemir. Continue sliding scale insulin. Diabetes coordinator consultation. 6. Hypothyroid: Continue Synthroid  7. Elevated troponin: This is due to demand ischemia and poor renal clearance and not ACS, likely. Continue to trend troponins.    All the records are reviewed and case discussed with ED provider. Management plans discussed with the patient and he is in agreement.  CODE STATUS: DNR  TOTAL TIME TAKING CARE OF THIS PATIENT: 50 minutes.    Oaklee Esther M.D on 01/23/2016 at 10:49 AM  Between 7am to 6pm - Pager - [phone] After 6pm go to [email or web address] - password [email or web address] [facility]  [facility]  Office  [phone]  CC: Primary care physician; [name], MD
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
## 2016-01-23 NOTE — Progress Notes (Signed)
Inpatient Diabetes Program Recommendations  AACE/ADA: New Consensus Statement on Inpatient Glycemic Control (2015)  Target Ranges:  Prepandial:   less than 140 mg/dL      [facility]ak postprandial:   less than 180 mg/dL (1-2 hours)      Critically ill patients:  140 - 180 mg/dL   Results for Taylor [name], Taylor [name] (MRN [id number]) as of 01/23/2016 12:47  Ref. Range 01/23/2016 12:21 01/23/2016 12:24 01/23/2016 12:44  Glucose-Capillary Latest Ref Range: 65-99 mg/dL 32 (LL) 32 (LL) 44 (LL)    Admit with: Hypotension/ UTI  History: DM Type 1, ESRD, Multiple other Co-Morbidities  Home DM Meds: Levemir 12 units QHS       Novolog 0-6 units QID  Current Insulin Orders: Novolog Sensitive Correction Scale/ SSI (0-9 units) TID AC + HS      MD- Note patient with Hypoglycemia on admission as well.  Will need his Levemir (basal insulin) added back at some point (perhaps at a slightly lower dose to start).  Please consider starting Levemir 9 units QHS tonight (75% of home dose).  Since patient is completely insulin deficient due to Type 1 DM, needs basal insulin to help prevent DKA.     --Will follow patient during hospitalization--  Ambrose Finland RN, MSN, CDE Diabetes Coordinator Inpatient Glycemic Control Team Team Pager: [phone] (8a-5p)

## 2016-01-23 NOTE — ED Notes (Signed)
Report given to Erica, RN

## 2016-01-23 NOTE — ED Notes (Signed)
Per EMS pt presents from [facility]. EMS states pt was sent for dialysis this morning, upon arrival to dialysis center pt was hypotensive at 80/40 so the dialysis center sent him back to [facility] and did not do a treatment. Pt c/o generalized weakness at this time. EMS states pt is on chronic 3L. Per EMS BP 100/60, pulse 70, 97% on 3L.

## 2016-01-24 LAB — C DIFFICILE QUICK SCREEN W PCR REFLEX
C DIFFICILE (CDIFF) INTERP: NEGATIVE
C DIFFICILE (CDIFF) TOXIN: NEGATIVE
C DIFFICLE (CDIFF) ANTIGEN: NEGATIVE

## 2016-01-24 LAB — BASIC METABOLIC PANEL
Anion gap: 8 (ref 5–15)
BUN: 35 mg/dL — ABNORMAL HIGH (ref 6–20)
CHLORIDE: 97 mmol/L — AB (ref 101–111)
CO2: 25 mmol/L (ref 22–32)
Calcium: 7.7 mg/dL — ABNORMAL LOW (ref 8.9–10.3)
Creatinine, Ser: 4.28 mg/dL — ABNORMAL HIGH (ref 0.61–1.24)
GFR calc non Af Amer: 15 mL/min — ABNORMAL LOW (ref >60)
GFR, EST AFRICAN AMERICAN: 18 mL/min — AB (ref >60)
Glucose, Bld: 182 mg/dL — ABNORMAL HIGH (ref 65–99)
Potassium: 5.1 mmol/L (ref 3.5–5.1)
SODIUM: 130 mmol/L — AB (ref 135–145)

## 2016-01-24 LAB — GLUCOSE, CAPILLARY
GLUCOSE-CAPILLARY: 255 mg/dL — AB (ref 65–99)
GLUCOSE-CAPILLARY: 199 mg/dL — AB (ref 65–99)
Glucose-Capillary: 207 mg/dL — ABNORMAL HIGH (ref 65–99)
Glucose-Capillary: 217 mg/dL — ABNORMAL HIGH (ref 65–99)
Glucose-Capillary: 182 mg/dL — ABNORMAL HIGH (ref 65–99)
Glucose-Capillary: 220 mg/dL — ABNORMAL HIGH (ref 65–99)

## 2016-01-24 LAB — TROPONIN I: TROPONIN I: 0.09 ng/mL — AB (ref <0.031)

## 2016-01-24 MED ORDER — NEPRO/CARBSTEADY PO LIQD
237.0000 mL | Freq: Two times a day (BID) | ORAL | Status: DC
  Administered 2016-01-24 – 2016-02-03 (×11): 237 mL via ORAL

## 2016-01-24 MED ORDER — EPOETIN ALFA 10000 UNIT/ML IJ SOLN
10000.0000 [IU] | INTRAMUSCULAR | Status: DC
  Administered 2016-01-25 – 2016-02-01 (×4): 10000 [IU] via INTRAVENOUS
  Filled 2016-01-24: qty 1

## 2016-01-24 NOTE — Progress Notes (Addendum)
[facility] - [place] at [facility]   PATIENT NAME: Taylor [name]    MR#:  [id number]  DATE OF BIRTH:  September 10, 1971  SUBJECTIVE:  CHIEF COMPLAINT:   Chief Complaint  Patient presents with  . Hypotension  . Weakness   - Admitted after dialysis for hypotension. Complains of diarrhea this morning. Prior history of C. difficile December 2016. -Blood pressure is much better.  REVIEW OF SYSTEMS:  Review of Systems  Constitutional: Negative for fever and chills.  HENT: Negative for ear discharge, ear pain and nosebleeds.   Eyes: Negative for blurred vision and double vision.  Respiratory: Negative for cough, shortness of breath and wheezing.   Cardiovascular: Negative for chest pain, palpitations and leg swelling.       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 Negative for nausea, vomiting, abdominal pain and constipation.  Genitourinary: Negative for dysuria and urgency.       Has a suprapubic catheter  Musculoskeletal: Positive for myalgias.  Neurological: Positive for focal weakness and weakness. Negative for dizziness, sensory change, speech change, seizures and headaches.       Bilateral lower extremity weakness  Psychiatric/Behavioral: Negative for depression.    DRUG ALLERGIES:   Allergies  Allergen Reactions  . Amoxicillin-Pot Clavulanate Nausea And Vomiting and Itching  . 2nd Skin Quick Heal Other (See Comments)  . Rifampin Nausea And Vomiting  . Tape Other (See Comments)    VITALS:  Blood pressure 122/60, pulse 76, temperature 97.5 F (36.4 C), temperature source Oral, resp. rate 18, height 6\' 3"  (1.905 m), weight 81.693 kg (180 lb 1.6 oz), SpO2 100 %.  PHYSICAL EXAMINATION:  Physical Exam  GENERAL:  45 y.o.-year-old patient lying in the bed with no acute distress.  EYES: Pupils equal, round, reactive to light and accommodation. No scleral icterus. Extraocular  muscles intact.  HEENT: Head atraumatic, normocephalic. Oropharynx and nasopharynx clear.  NECK:  Supple, no jugular venous distention. No thyroid enlargement, no tenderness.  LUNGS: Normal breath sounds bilaterally, no wheezing, rales,rhonchi or crepitation. No use of accessory muscles of respiration. Decreased bibasilar breath sounds CARDIOVASCULAR: S1, S2 normal. No  rubs, or gallops. Soft 3/6 systolic murmur is present ABDOMEN: Abdomen is distended but Soft, nontender. Bowel sounds present. No organomegaly or mass.  EXTREMITIES: No pedal edema, cyanosis, or clubbing. Status post right transmetatarsal amputation and also amputation of left toes NEUROLOGIC: Cranial nerves II through XII are intact. No new focal motor deficits. Strength in both upper extremities is 5/5 but lower extremities are 2/5.49 Sensation intact. Gait not checked.  PSYCHIATRIC: The patient is alert and oriented x 3.  SKIN: No obvious rash, lesion, or ulcer.    LABORATORY PANEL:   CBC  Recent Labs Lab 01/23/16 1248  WBC 8.0  HGB 8.3*  HCT 26.5*  PLT 99*   ------------------------------------------------------------------------------------------------------------------  Chemistries   Recent Labs Lab 01/23/16 0842  01/24/16 0031  NA 133*  --  130*  K 4.2  --  5.1  CL 96*  --  97*  CO2 26  --  25  GLUCOSE 62*  --  182*  BUN 32*  --  35*  CREATININE 4.03*  < > 4.28*  CALCIUM 8.4*  --  7.7*  AST 26  --   --   ALT 14*  --   --   ALKPHOS 515*  --   --   BILITOT 1.2  --   --   < > =  values in this interval not displayed. ------------------------------------------------------------------------------------------------------------------  Cardiac Enzymes  Recent Labs Lab 01/24/16 0031  TROPONINI 0.09*   ------------------------------------------------------------------------------------------------------------------  RADIOLOGY:  Dg Chest 1 View  01/23/2016  CLINICAL DATA:  Hypotension and weakness  EXAM: CHEST 1 VIEW COMPARISON:  01/08/2016 FINDINGS: Cardiac shadow is stable. Postoperative changes are again seen. Vascular congestion with mild interstitial edema is noted. A left arm vascular stent is again noted and stable. IMPRESSION: Changes consistent with congestive failure likely related to a degree of volume overload. No focal confluent infiltrate is noted. Electronically Signed   By: Alcide Clever M.D.   On: 01/23/2016 08:43    EKG:   Orders placed or performed during the hospital encounter of 01/23/16  . ED EKG  . ED EKG  . EKG 12-Lead  . EKG 12-Lead  . EKG 12-Lead  . EKG 12-Lead    ASSESSMENT AND PLAN:   45 year old male with known history of hypertension, diabetes mellitus, end-stage renal disease on hemodialysis, history of C. difficile colitis in December 2016, peripheral vascular disease status post amputation of toes admitted to the hospital secondary to hypotension noted after dialysis.  #1 hypotension- hypovolemic, received fluids. Monitor closely as patient has diastolic CHF and can get fluid overloaded. For dialysis today. As dialysis couldn't be completed yesterday. - on midodrine  #2 diarrhea-send stool studies for C. difficile. His last C. difficile was positive in December. C. difficile tests from February was negative. Continue probiotics  #3 end-stage renal disease on hemodialysis-on Tuesday, Thursday and Saturday hemodialysis. Dialysis couldn't be completed due to hypotension yesterday. For hemodialysis today. - Nephrology consulted  #4 chronic diastolic congestive heart failure- hypertensive yesterday. For dialysis today. -Chest x-ray with findings of pulmonary vascular congestion noted - on chronically on 3l o2.  #5 diabetes mellitus-low sugars yesterday. Lantus held. Continue sliding scale insulin and monitor sugars for now.  #6 UTI- has chronic suprapubic catheter - changed yesterday after admission - Cultures pending. Continue Rocephin. If C.  difficile is positive will need to try to cut down antibiotics.  #7 GERD- protonix  #8 DVT Prophylaxis- SQ heparin   All the records are reviewed and case discussed with Care Management/Social Workerr. Management plans discussed with the patient, family and they are in agreement.  CODE STATUS: Full Code  TOTAL TIME TAKING CARE OF THIS PATIENT: 37 minutes.   POSSIBLE D/C IN 1-2 DAYS, DEPENDING ON CLINICAL CONDITION.   Enid Baas M.D on 01/24/2016 at 9:26 AM  Between 7am to 6pm - Pager - [phone]  After 6pm go to [email or web address] - password [email or web address] [facility]  [place] [facility]  Office  [phone]  CC: Primary care physician; [name], MD

## 2016-01-24 NOTE — Progress Notes (Signed)
PRE HD   

## 2016-01-24 NOTE — Progress Notes (Signed)
Patient returned from partial dialysis treatment. VSS: BP 123/59 mmHg  Pulse 77  Temp(Src) 94.3 F (34.6 C) (Oral)  Resp 11  Ht 6\' 3"  (1.905 m)  Wt 83.9 kg (184 lb 15.5 oz)  BMI 23.12 kg/m2  SpO2 100%, he is A&O x 4, and eating lunch. No complaints at this time, family at bedside.

## 2016-01-24 NOTE — Progress Notes (Signed)
TX ended.    

## 2016-01-24 NOTE — Progress Notes (Signed)
[facility]  ROUNDING NOTE   Subjective:  Patient well-known to [name]. Patient was admitted last week for sepsis with Escherichia coli in the urine. Yesterday at the dialysis center he was lethargic and hypoglycemic prior to being placed on dialysis. He did not receive urinalysis yesterday and was sent over to [facility] regional. C. Difficile toxin does far is negative. Blood cultures are also negative at this time.   Objective:  Vital signs in last 24 hours:  Temp:  [94.3 F (34.6 C)-97.5 F (36.4 C)] 94.3 F (34.6 C) (03/17 1030) Pulse Rate:  [72-76] 72 (03/17 1200) Resp:  [12-27] 13 (03/17 1200) BP: (91-126)/(50-69) 91/56 mmHg (03/17 1200) SpO2:  [99 %-100 %] 100 % (03/17 1200) Weight:  [85.9 kg (189 lb 6 oz)] 85.9 kg (189 lb 6 oz) (03/17 1030)  Weight change:  Filed Weights   01/23/16 1214 01/24/16 1030  Weight: 81.693 kg (180 lb 1.6 oz) 85.9 kg (189 lb 6 oz)    Intake/Output: I/O last 3 completed shifts: In: 1197 [P.O.:600; I.V.:547; IV Piggyback:50] Out: 50 [Urine:50]   Intake/Output this shift:     Physical Exam: General: Chronically ill appearing  Head: Normocephalic, atraumatic. Moist oral mucosal membranes  Eyes: Anicteric, PERRL  Neck: Supple, trachea midline  Lungs:  Clear to auscultation normal effort  Heart: S1S2 no rubs  Abdomen:  Soft, nontender, BS present   Extremities: 1+ peripheral edema.  Neurologic: Nonfocal, moving all four extremities  Skin: No lesions  Access: LUE AVF    Basic Metabolic Panel:  Recent Labs Lab 01/23/16 0842 01/23/16 1248 01/24/16 0031  NA 133*  --  130*  K 4.2  --  5.1  CL 96*  --  97*  CO2 26  --  25  GLUCOSE 62*  --  182*  BUN 32*  --  35*  CREATININE 4.03* 4.10* 4.28*  CALCIUM 8.4*  --  7.7*    Liver Function Tests:  Recent Labs Lab 01/23/16 0842  AST 26  ALT 14*  ALKPHOS 515*  BILITOT 1.2  PROT 7.0  ALBUMIN 2.1*   No results for input(s): LIPASE, AMYLASE in the last 168 hours. No  results for input(s): AMMONIA in the last 168 hours.  CBC:  Recent Labs Lab 01/23/16 0842 01/23/16 1248  WBC 7.0 8.0  NEUTROABS 5.5  --   HGB 8.9* 8.3*  HCT 28.1* 26.5*  MCV 89.7 90.4  PLT 106* 99*    Cardiac Enzymes:  Recent Labs Lab 01/23/16 0842 01/23/16 1248 01/23/16 1818 01/24/16 0031  TROPONINI 0.12* 0.11* 0.15* 0.09*    BNP: Invalid input(s): POCBNP  CBG:  Recent Labs Lab 01/23/16 1650 01/23/16 2001 01/23/16 2342 01/24/16 0423 01/24/16 0804  GLUCAP 115* 122* 154* 207* 255*    Microbiology: Results for orders placed or performed during the hospital encounter of 01/23/16  Urine culture     Status: None (Preliminary result)   Collection Time: 01/23/16  8:42 AM  Result Value Ref Range Status   Specimen Description URINE, CATHETERIZED  Final   Special Requests Normal  Final   Culture TOO YOUNG TO READ  Final   Report Status PENDING  Incomplete  C difficile quick scan w PCR reflex     Status: None   Collection Time: 01/23/16  5:51 PM  Result Value Ref Range Status   C Diff antigen NEGATIVE NEGATIVE Final   C Diff toxin NEGATIVE NEGATIVE Final   C Diff interpretation Negative for C. difficile  Final  Coagulation Studies: No results for input(s): LABPROT, INR in the last 72 hours.  Urinalysis:  Recent Labs  01/23/16 0842  COLORURINE YELLOW*  LABSPEC 1.006  PHURINE 5.0  GLUCOSEU 50*  HGBUR 3+*  BILIRUBINUR NEGATIVE  KETONESUR NEGATIVE  PROTEINUR 100*  NITRITE NEGATIVE  LEUKOCYTESUR 2+*      Imaging: Dg Chest 1 View  01/23/2016  CLINICAL DATA:  Hypotension and weakness EXAM: CHEST 1 VIEW COMPARISON:  01/08/2016 FINDINGS: Cardiac shadow is stable. Postoperative changes are again seen. Vascular congestion with mild interstitial edema is noted. A left arm vascular stent is again noted and stable. IMPRESSION: Changes consistent with congestive failure likely related to a degree of volume overload. No focal confluent infiltrate is noted.  Electronically Signed   By: Alcide Clever M.D.   On: 01/23/2016 08:43     Medications:   . sodium chloride 40 mL/hr at 01/23/16 1506   . acidophilus  1 capsule Oral Daily  . amitriptyline  25 mg Oral QHS  . aspirin EC  81 mg Oral Daily  . calcium-vitamin D  1 tablet Oral Daily  . cefTRIAXone (ROCEPHIN)  IV  1 g Intravenous Q24H  . chlorhexidine  15 mL Mouth Rinse BID  . cinacalcet  30 mg Oral Daily  . citalopram  20 mg Oral Daily  . clopidogrel  75 mg Oral Daily  . docusate sodium  100 mg Oral BID  . heparin subcutaneous  5,000 Units Subcutaneous Q12H  . insulin aspart  0-5 Units Subcutaneous QHS  . insulin aspart  0-9 Units Subcutaneous TID WC  . levothyroxine  75 mcg Oral QAC breakfast  . metolazone  5 mg Oral Daily  . metroNIDAZOLE  500 mg Oral BID  . nystatin   Topical BID  . omega-3 acid ethyl esters  1 g Oral Daily   And  . vitamin E  100 Units Oral Daily  . pantoprazole  40 mg Oral BID  . pregabalin  75 mg Oral BID  . rosuvastatin  20 mg Oral QHS  . sevelamer carbonate  800 mg Oral TID WC  . sodium chloride flush  3 mL Intravenous Q12H  . traZODone  50 mg Oral QHS   acetaminophen **OR** acetaminophen, acetaminophen, albuterol, alum & mag hydroxide-simeth, HYDROcodone-acetaminophen, midodrine, nitroGLYCERIN, ondansetron **OR** ondansetron (ZOFRAN) IV, senna-docusate  Assessment/ Plan:  45 y.o. 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   [facility] TTS  1. ESRD on HD TTHS: patient missed dialysis treatment today.   Patient seen and evaluated during dialysis treatment today.  Today will be a short treatment of 2 hours and we will plan for his regular treatment tomorrow as well.   2. Anemia of CKD: hemoglobin low at 8.3.  We will start Epogen 10,000 units IV with dialysis.  3. Secondary Hyperparathyroidism:  Check intact PTH and phosphorus with dialysis today.  4.  Hypotension: blood and urine cultures pending at the moment. Hypertension could be infection related.  Continue to monitor blood pressure closely during dialysis.   LOS: 1 Taylor [name] 3/17/201712:27 PM

## 2016-01-24 NOTE — Progress Notes (Signed)
Inpatient Diabetes Program Recommendations  AACE/ADA: New Consensus Statement on Inpatient Glycemic Control (2015)  Target Ranges:  Prepandial:   less than 140 mg/dL      [facility] postprandial:   less than 180 mg/dL (1-2 hours)      Critically ill patients:  140 - 180 mg/dL   Results for Taylor [name], Taylor [name] (MRN [id number]) as of 01/24/2016 10:19  Ref. Range 01/23/2016 12:21 01/23/2016 12:24 01/23/2016 12:44 01/23/2016 13:01 01/23/2016 16:50 01/23/2016 20:01  Glucose-Capillary Latest Ref Range: 65-99 mg/dL 32 (LL) 32 (LL) 44 (LL) 78 115 (H) 122 (H)   Results for Taylor [name], Taylor [name] (MRN [id number]) as of 01/24/2016 10:19  Ref. Range 01/23/2016 23:42 01/24/2016 04:23 01/24/2016 08:04  Glucose-Capillary Latest Ref Range: 65-99 mg/dL 163 (H) 845 (H) 364 (H)    Admit with: Hypotension/ UTI  History: DM Type 1, ESRD, Multiple other Co-Morbidities  Home DM Meds: Levemir 12 units QHS  Novolog 0-6 units QID  Current Insulin Orders: Novolog Sensitive Correction Scale/ SSI (0-9 units) TID AC + HS      MD- Note patient with Hypoglycemia on admission. Will need his Levemir (basal insulin) added back at some point (perhaps at a slightly lower dose to start).  Please consider starting Levemir 9 units daily- start today (75% of home dose). Since patient is completely insulin deficient due to Type 1 DM, needs basal insulin to help prevent DKA.     --Will follow patient during hospitalization--  Ambrose Finland RN, MSN, CDE Diabetes Coordinator Inpatient Glycemic Control Team Team Pager: [phone] (8a-5p)

## 2016-01-24 NOTE — Progress Notes (Signed)
Pre HD  

## 2016-01-24 NOTE — Progress Notes (Signed)
Initial Nutrition Assessment   INTERVENTION:   Meals and Snacks: Cater to patient preferences Medical Food Supplement Therapy: recommend addition of Nepro Shake po BID, each supplement provides 425 kcal and 19 grams protein  NUTRITION DIAGNOSIS:   Inadequate oral intake related to acute illness, poor appetite as evidenced by per patient/family report.  GOAL:   Patient will meet greater than or equal to 90% of their needs  MONITOR:   PO intake, Supplement acceptance, Labs, Weight trends, I & O's  REASON FOR ASSESSMENT:    (stage II pressure ulcer)    ASSESSMENT:    Pt admitted with hypotension due to UTI; pt with ESRD on HD  Past Medical History  Diagnosis Date  . End stage renal disease on dialysis (HCC)     LUE fistula  . Type I diabetes mellitus (HCC)     a. 03/2014 admitted with HNK to [facility]. b. TTS  . Diabetic neuropathy (HCC)     severe, s/p multiple toe amputation  . Hypothyroidism   . Hypertension   . Hyperlipidemia   . H/O hiatal hernia   . GERD (gastroesophageal reflux disease)   . Anxiety   . Sebaceous cyst     side of neck  . Pneumonia     2010  . Anemia     a. req PRBC's 2011.  [name] PAD (peripheral artery disease) (HCC)     a. s/p amputation of toes on the right;  b. left LE claudication.  . Coronary artery disease     a. s/p MI;  b. 10/2009 CABG x 3 @ [facility]: LIMA->LAD, VG->OM3, VG->RPDA; c. 11/2010 Cath 3/3 patent grafts;  d 12/2012 Cath: LM 30d, LAD 85p, D1 70, D2 90, LCX 40ost, OM2 100, RCA 90p, 174m, L->LAD ok, VG->OM3 ok, VG->RPDA 30, EF 50%->Med Rx.  . Cataract     right  . Valvular disease     a. 11/2012 Echo: EF 55-60%, mild LVH, mild MR, mild bi-atrial enlargement, mild-mod TR, PASP ; b. echo 03/2014: EF 50-55%, nl WM, select images concerning for bicuspid aortic valve w/ nl thicknes of leaflets, mild MR, mild bi-atrial dilatation, RV mild dilatation - wall thickness nl, mod TR - select images appears to be mod to sev, PASP at least mod  elevated.  . Diastolic CHF (HCC)     see echo above  . Depression   . Arthritis     rheumatoid arthritis   . Myocardial infarction (HCC) 2010  . Pulmonary HTN (HCC)     a. continuation of 03/2014 echo PASP @ least mod elevated. RVSP 52 mm Hg. Parasternal long axis estimated @ 85 mm Hg  . Scrotal abscess     a. s/p multiple I&D  . Penile abscess     a. s/p multiple I&D  . Hematemesis     a. EGD 2016 with LA Grade C esophagitis, continued on Protonix   . Chronic respiratory failure (HCC)     a. on 3L oxygen via nasal cannula  . Anxiety   . Acute delirium   . Sepsis (HCC)   . 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 MYOCARDIAL INFARCTION, HX OF 01/26/2011    Qualifier: Diagnosis of  By: [name] MD, [name]    . HTN (hypertension) 02/11/2011  . Pulmonary hypertension associated with end stage renal disease on dialysis (HCC) 04/16/2014  . Hyperkalemia   . Hypothyroidism   . Osteomyelitis (HCC)      Diet Order:  Diet renal/carb modified with fluid restriction  Diet-HS Snack?: Nothing; Room service appropriate?: Yes; Fluid consistency:: Thin  Energy Intake: no recorded po intake,   Skin: stage II pressure ulcer on buttock, stage I on heel Last BM:  3/17    Recent Labs Lab 01/23/16 0842 01/23/16 1248 01/24/16 0031  NA 133*  --  130*  K 4.2  --  5.1  CL 96*  --  97*  CO2 26  --  25  BUN 32*  --  35*  CREATININE 4.03* 4.10* 4.28*  CALCIUM 8.4*  --  7.7*  GLUCOSE 62*  --  182*    Glucose Profile:   Recent Labs  01/24/16 0423 01/24/16 0804 01/24/16 1344  GLUCAP 207* 255* 199*   Meds: ss novolog, sensipar, NS at 40 ml/hr, acidophilus  Height:   Ht Readings from Last 1 Encounters:  01/23/16 6\' 3"  (1.905 m)    Weight: stable wt per wt encounters   Wt Readings from Last 1 Encounters:  01/24/16 184 lb 15.5 oz (83.9 kg)    Filed Weights   01/23/16 1214 01/24/16 1030 01/24/16 1237  Weight: 180 lb 1.6 oz (81.693 kg) 189 lb 6 oz (85.9 kg) 184 lb 15.5 oz (83.9 kg)   Wt  Readings from Last 10 Encounters:  01/24/16 184 lb 15.5 oz (83.9 kg)  01/16/16 189 lb 2.5 oz (85.8 kg)  12/18/15 200 lb (90.719 kg)  11/15/15 195 lb (88.451 kg)  10/17/15 179 lb 14.3 oz (81.6 kg)  08/09/15 185 lb (83.915 kg)  07/08/15 185 lb (83.915 kg)  06/13/15 190 lb (86.183 kg)  06/10/15 185 lb (83.915 kg)  06/10/15 185 lb (83.915 kg)    BMI:  Body mass index is 23.12 kg/(m^2).  Estimated Nutritional Needs:   Kcal:  2100-2520 kcals   Protein:  101-126 g (1.2-1.5 g/kg)   Fluid:  UOP plus UOP  MODERATE Care Level   [facility] MS, RD, LDN [phone] Pager  [phone] Weekend/On-Call Pager

## 2016-01-24 NOTE — Progress Notes (Signed)
POST HD 

## 2016-01-24 NOTE — Clinical Social Work Note (Signed)
Clinical Social Work Assessment  Patient Details  Name: Taylor [name] MRN: [id number] Date of Birth: [birth year]  Date of referral:  01/24/16               Reason for consult:  Facility Placement                Permission sought to share information with:  Facility Sport and exercise psychologist, Family Supports Permission granted to share information::  Yes, Verbal Permission Granted  Name::        Agency::     Relationship::     Contact Information:     Housing/Transportation Living arrangements for the past 2 months:  [place] of Information:  Patient, Parent Patient Interpreter Needed:  None Criminal Activity/Legal Involvement Pertinent to Current Situation/Hospitalization:  No - Comment as needed Significant Relationships:  Parents Lives with:  Facility Resident Do you feel safe going back to the place where you live?  Yes Need for family participation in patient care:  Yes (Comment)  Care giving concerns:  None: Patient recently placed at [facility] for STR.   Social Worker assessment / plan:  CSW met with patient and his father this afternoon. Patient and father known to CSW from previous admission. CSW spoke with patient and father and they wish to return to [facility] when time. They stated that they were unable to hold his bed there. CSW has verified with Taylor [name] at [employer] that patient can return when time.   Employment status:  Disabled (Comment on whether or not currently receiving Disability) Insurance information:  Medicare PT Recommendations:  Not assessed at this time Information / Referral to community resources:     Patient/Family's Response to care:  Patient expressed appreciation for CSW visit.  Patient/Family's Understanding of and Emotional Response to Diagnosis, Current Treatment, and Prognosis:  Patient stated that he wanted to return to [facility] when time. Patient was quiet and father spoke mainly for  patient.  Emotional Assessment Appearance:  Appears stated age, Disheveled Attitude/Demeanor/Rapport:   (pleasant and cooperative) Affect (typically observed):  Accepting, Adaptable, Calm Orientation:  Oriented to Self, Oriented to Place Alcohol / Substance use:  Not Applicable Psych involvement (Current and /or in the community):  No (Comment)  Discharge Needs  Concerns to be addressed:  Care Coordination Readmission within the last 30 days:  No Current discharge risk:  None Barriers to Discharge:  No Barriers Identified   Shela Leff, LCSW 01/24/2016, 2:10 PM

## 2016-01-24 NOTE — Consult Note (Signed)
   [facility] CM Inpatient Consult   01/24/2016  Taylor [name] 04/26/1971 [id number]  Visited patient at bedside. Made patient aware his [name] manager Ander Purpura, RN had asked about him and sent him well wishes. Patient states his current discharge plan is to go back to [facility] pending if there is still a bed available there have been no discharge orders written as of note. Patient is currently active with [facility] Care Management for chronic disease management services.  Patient has been engaged by a Building surveyor and [facility]- CSW.  Our community based plan of care has focused on disease management and community resource support.  Patient will receive a post discharge transition of care call and will be evaluated for monthly home visits for assessments and disease process education.  Of note, [facility] Care Management services does not replace or interfere with any services that are needed or arranged by inpatient case management or social work. Will make community care manager and [facility]- SW know of patients pending plans. For additional questions or referrals please contact:  Jennings Stirling RN, BSN [facility] Liaison  [phone]) Business Mobile [phone]) Toll free office

## 2016-01-24 NOTE — Progress Notes (Signed)
HD started. 

## 2016-01-25 LAB — PHOSPHORUS: PHOSPHORUS: 4.5 mg/dL (ref 2.5–4.6)

## 2016-01-25 LAB — GLUCOSE, CAPILLARY
GLUCOSE-CAPILLARY: 177 mg/dL — AB (ref 65–99)
GLUCOSE-CAPILLARY: 212 mg/dL — AB (ref 65–99)
GLUCOSE-CAPILLARY: 234 mg/dL — AB (ref 65–99)
Glucose-Capillary: 224 mg/dL — ABNORMAL HIGH (ref 65–99)
Glucose-Capillary: 251 mg/dL — ABNORMAL HIGH (ref 65–99)
Glucose-Capillary: 283 mg/dL — ABNORMAL HIGH (ref 65–99)
Glucose-Capillary: 311 mg/dL — ABNORMAL HIGH (ref 65–99)

## 2016-01-25 MED ORDER — SODIUM CHLORIDE 0.9 % IV SOLN: 100.0000 mL | INTRAVENOUS | Status: DC | PRN

## 2016-01-25 MED ORDER — INSULIN GLARGINE 100 UNIT/ML ~~LOC~~ SOLN
10.0000 [IU] | Freq: Every day | SUBCUTANEOUS | Status: DC
  Administered 2016-01-25 – 2016-01-30 (×5): 10 [IU] via SUBCUTANEOUS
  Filled 2016-01-25 (×8): qty 0.1

## 2016-01-25 MED ORDER — LIDOCAINE HCL (PF) 1 % IJ SOLN
5.0000 mL | INTRAMUSCULAR | Status: DC | PRN
  Filled 2016-01-25: qty 5

## 2016-01-25 MED ORDER — LIDOCAINE-PRILOCAINE 2.5-2.5 % EX CREA
1.0000 "application " | TOPICAL_CREAM | CUTANEOUS | Status: DC | PRN
  Filled 2016-01-25: qty 5

## 2016-01-25 MED ORDER — ALTEPLASE 2 MG IJ SOLR
2.0000 mg | Freq: Once | INTRAMUSCULAR | Status: DC | PRN
  Filled 2016-01-25: qty 2

## 2016-01-25 MED ORDER — HEPARIN SODIUM (PORCINE) 1000 UNIT/ML DIALYSIS
1000.0000 [IU] | INTRAMUSCULAR | Status: DC | PRN
  Filled 2016-01-25: qty 1

## 2016-01-25 MED ORDER — PENTAFLUOROPROP-TETRAFLUOROETH EX AERO
1.0000 "application " | INHALATION_SPRAY | CUTANEOUS | Status: DC | PRN
  Filled 2016-01-25: qty 30

## 2016-01-25 NOTE — Progress Notes (Signed)
Pre-hd tx 

## 2016-01-25 NOTE — Progress Notes (Signed)
Hemodialysis completed. 

## 2016-01-25 NOTE — Progress Notes (Signed)
Post hd tx 

## 2016-01-25 NOTE — Progress Notes (Signed)
[facility] at [facility]   PATIENT NAME: Taylor [name]    MR#:  [id number]  DATE OF BIRTH:  [birth year]  SUBJECTIVE:  CHIEF COMPLAINT:   Chief Complaint  Patient presents with  . Hypotension  . Weakness   - Admitted after dialysis for hypotension. Complains of diarrhea this morning. Prior history of C. difficile December 2016. -Blood pressure is much better.  REVIEW OF SYSTEMS:  Review of Systems  Constitutional: Negative for fever and chills.  HENT: Negative for ear discharge, ear pain and nosebleeds.   Eyes: Negative for blurred vision and double vision.  Respiratory: Negative for cough, shortness of breath and wheezing.   Cardiovascular: Negative for chest pain, palpitations and leg swelling.       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 Negative for nausea, vomiting, abdominal pain and constipation.  Genitourinary: Negative for dysuria and urgency.       Has a suprapubic catheter  Musculoskeletal: Positive for myalgias.  Neurological: Positive for focal weakness and weakness. Negative for dizziness, sensory change, speech change, seizures and headaches.       Bilateral lower extremity weakness  Psychiatric/Behavioral: Negative for depression.    DRUG ALLERGIES:   Allergies  Allergen Reactions  . Amoxicillin-Pot Clavulanate Nausea And Vomiting and Itching  . 2nd Skin Quick Heal Other (See Comments)  . Rifampin Nausea And Vomiting  . Tape Other (See Comments)    VITALS:  Blood pressure 107/53, pulse 85, temperature 98.4 F (36.9 C), temperature source Oral, resp. rate 20, height 6\' 3"  (1.905 m), weight 83.235 kg (183 lb 8 oz), SpO2 100 %.  PHYSICAL EXAMINATION:  Physical Exam  GENERAL:  45 y.o.-year-old patient lying in the bed with no acute distress.  EYES: Pupils equal, round, reactive to light and accommodation. No scleral icterus. Extraocular  muscles intact.  HEENT: Head atraumatic, normocephalic. Oropharynx and nasopharynx clear.  NECK:  Supple, no jugular venous distention. No thyroid enlargement, no tenderness.  LUNGS: Normal breath sounds bilaterally, no wheezing, rales,rhonchi or crepitation. No use of accessory muscles of respiration. Decreased bibasilar breath sounds CARDIOVASCULAR: S1, S2 normal. No  rubs, or gallops. Soft 3/6 systolic murmur is present ABDOMEN: Abdomen is distended but Soft, nontender. Bowel sounds present. No organomegaly or mass.  EXTREMITIES: No pedal edema, cyanosis, or clubbing. Status post right transmetatarsal amputation and also amputation of left toes NEUROLOGIC: Cranial nerves II through XII are intact. No new focal motor deficits. Strength in both upper extremities is 5/5 but lower extremities are 2/5.57 Sensation intact. Gait not checked.  PSYCHIATRIC: The patient is alert and oriented x 3.  SKIN: No obvious rash, lesion, or ulcer.    LABORATORY PANEL:   CBC  Recent Labs Lab 01/23/16 1248  WBC 8.0  HGB 8.3*  HCT 26.5*  PLT 99*   ------------------------------------------------------------------------------------------------------------------  Chemistries   Recent Labs Lab 01/23/16 0842  01/24/16 0031  NA 133*  --  130*  K 4.2  --  5.1  CL 96*  --  97*  CO2 26  --  25  GLUCOSE 62*  --  182*  BUN 32*  --  35*  CREATININE 4.03*  < > 4.28*  CALCIUM 8.4*  --  7.7*  AST 26  --   --   ALT 14*  --   --   ALKPHOS 515*  --   --   BILITOT 1.2  --   --   < > =  values in this interval not displayed. ------------------------------------------------------------------------------------------------------------------  Cardiac Enzymes  Recent Labs Lab 01/24/16 0031  TROPONINI 0.09*   ------------------------------------------------------------------------------------------------------------------  RADIOLOGY:  No results found.  EKG:   Orders placed or performed during the  hospital encounter of 01/23/16  . ED EKG  . ED EKG  . EKG 12-Lead  . EKG 12-Lead  . EKG 12-Lead  . EKG 12-Lead    ASSESSMENT AND PLAN:   45 year old male with known history of hypertension, diabetes mellitus, end-stage renal disease on hemodialysis, history of C. difficile colitis in December 2016, peripheral vascular disease status post amputation of toes admitted to the hospital secondary to hypotension noted after dialysis.  #1 Hypotension- hypovolemic, Received fluids. -Monitor closely as patient has diastolic CHF and can get fluid overloaded. - on midodrine - for dialysis again today  #2 diarrhea-Improved, no further diarrhea now. Negative c.diff. last C. difficile was positive in December.  - Continue probiotics while on antibiotics  #3 End-stage renal disease on hemodialysis-on Tuesday, Thursday and Saturday hemodialysis.  - Nephrology consulted -Dialysis today per schedule  #4 chronic diastolic congestive heart failure- dialysis today -Chest x-ray with findings of pulmonary vascular congestion noted - on chronically on 3l o2.  #5 diabetes mellitus- restart Lantus as sugars elevated. Continue sliding scale insulin and monitor sugars for now.  #6 UTI- has chronic suprapubic catheter - changed on admission - Cultures pending. Continue Rocephin.   #7 GERD- protonix  #8 DVT Prophylaxis- SQ heparin  Patient is from [facility]. Possible discharge tomorrow.   All the records are reviewed and case discussed with Care Management/Social Workerr. Management plans discussed with the patient, family and they are in agreement.  CODE STATUS: Full Code  TOTAL TIME TAKING CARE OF THIS PATIENT: 37 minutes.   POSSIBLE D/C IN 1 DAYS, DEPENDING ON CLINICAL CONDITION.   Enid Baas M.D on 01/25/2016 at 11:49 AM  Between 7am to 6pm - Pager - [phone]  After 6pm go to [email or web address] - password [email or web address] [facility]  [place] [facility]  Office   [phone]  CC: Primary care physician; [name], MD

## 2016-01-25 NOTE — Progress Notes (Signed)
[facility]  ROUNDING NOTE   Subjective:  Patient status appears improved this a.m. He is much more awake and alert. He ate breakfast and tolerated this as well.   Objective:  Vital signs in last 24 hours:  Temp:  [94.3 F (34.6 C)-98.4 F (36.9 C)] 98.4 F (36.9 C) (03/17 2022) Pulse Rate:  [72-85] 85 (03/17 2022) Resp:  [11-27] 20 (03/17 2022) BP: (91-126)/(53-69) 107/53 mmHg (03/17 2022) SpO2:  [100 %] 100 % (03/17 2022) Weight:  [83.235 kg (183 lb 8 oz)-85.9 kg (189 lb 6 oz)] 83.235 kg (183 lb 8 oz) (03/18 0509)  Weight change: 4.207 kg (9 lb 4.4 oz) Filed Weights   01/24/16 1030 01/24/16 1237 01/25/16 0509  Weight: 85.9 kg (189 lb 6 oz) 83.9 kg (184 lb 15.5 oz) 83.235 kg (183 lb 8 oz)    Intake/Output: I/O last 3 completed shifts: In: 1528.1 [P.O.:240; I.V.:1288.1] Out: 2030 [Urine:30; Other:2000]   Intake/Output this shift:  Total I/O In: 24 [I.V.:24] Out: 1 [Stool:1]  Physical Exam: General: Chronically ill appearing  Head: Normocephalic, atraumatic. Moist oral mucosal membranes  Eyes: Anicteric, PERRL  Neck: Supple, trachea midline  Lungs:  Clear to auscultation normal effort  Heart: S1S2 no rubs  Abdomen:  Soft, nontender, BS present   Extremities: 1+ peripheral edema.  Neurologic: Nonfocal, moving all four extremities  Skin: No lesions  Access: LUE AVF    Basic Metabolic Panel:  Recent Labs Lab 01/23/16 0842 01/23/16 1248 01/24/16 0031  NA 133*  --  130*  K 4.2  --  5.1  CL 96*  --  97*  CO2 26  --  25  GLUCOSE 62*  --  182*  BUN 32*  --  35*  CREATININE 4.03* 4.10* 4.28*  CALCIUM 8.4*  --  7.7*    Liver Function Tests:  Recent Labs Lab 01/23/16 0842  AST 26  ALT 14*  ALKPHOS 515*  BILITOT 1.2  PROT 7.0  ALBUMIN 2.1*   No results for input(s): LIPASE, AMYLASE in the last 168 hours. No results for input(s): AMMONIA in the last 168 hours.  CBC:  Recent Labs Lab 01/23/16 0842 01/23/16 1248  WBC 7.0 8.0   NEUTROABS 5.5  --   HGB 8.9* 8.3*  HCT 28.1* 26.5*  MCV 89.7 90.4  PLT 106* 99*    Cardiac Enzymes:  Recent Labs Lab 01/23/16 0842 01/23/16 1248 01/23/16 1818 01/24/16 0031  TROPONINI 0.12* 0.11* 0.15* 0.09*    BNP: Invalid input(s): POCBNP  CBG:  Recent Labs Lab 01/24/16 2020 01/24/16 2141 01/25/16 0033 01/25/16 0424 01/25/16 0741  GLUCAP 182* 220* 234* 251* 311*    Microbiology: Results for orders placed or performed during the hospital encounter of 01/23/16  Blood culture (routine x 2)     Status: None (Preliminary result)   Collection Time: 01/23/16  8:42 AM  Result Value Ref Range Status   Specimen Description BLOOD RIGHT AC  Final   Special Requests   Final    BOTTLES DRAWN AEROBIC AND ANAEROBIC AER ANA   Culture NO GROWTH 2 DAYS  Final   Report Status PENDING  Incomplete  Blood culture (routine x 2)     Status: None (Preliminary result)   Collection Time: 01/23/16  8:42 AM  Result Value Ref Range Status   Specimen Description BLOOD RIGHT HAND  Final   Special Requests   Final    BOTTLES DRAWN AEROBIC AND ANAEROBIC AER ANA   Culture  NO GROWTH 2 DAYS  Final   Report Status PENDING  Incomplete  Urine culture     Status: None (Preliminary result)   Collection Time: 01/23/16  8:42 AM  Result Value Ref Range Status   Specimen Description URINE, CATHETERIZED  Final   Special Requests Normal  Final   Culture TOO YOUNG TO READ  Final   Report Status PENDING  Incomplete  C difficile quick scan w PCR reflex     Status: None   Collection Time: 01/23/16  5:51 PM  Result Value Ref Range Status   C Diff antigen NEGATIVE NEGATIVE Final   C Diff toxin NEGATIVE NEGATIVE Final   C Diff interpretation Negative for C. difficile  Final    Coagulation Studies: No results for input(s): LABPROT, INR in the last 72 hours.  Urinalysis:  Recent Labs  01/23/16 0842  COLORURINE YELLOW*  LABSPEC 1.006  PHURINE 5.0  GLUCOSEU 50*  HGBUR 3+*   BILIRUBINUR NEGATIVE  KETONESUR NEGATIVE  PROTEINUR 100*  NITRITE NEGATIVE  LEUKOCYTESUR 2+*      Imaging: No results found.   Medications:   . sodium chloride 40 mL/hr at 01/23/16 1506   . acidophilus  1 capsule Oral Daily  . amitriptyline  25 mg Oral QHS  . aspirin EC  81 mg Oral Daily  . calcium-vitamin D  1 tablet Oral Daily  . cefTRIAXone (ROCEPHIN)  IV  1 g Intravenous Q24H  . chlorhexidine  15 mL Mouth Rinse BID  . cinacalcet  30 mg Oral Daily  . citalopram  20 mg Oral Daily  . clopidogrel  75 mg Oral Daily  . docusate sodium  100 mg Oral BID  . epoetin (EPOGEN/PROCRIT) injection  10,000 Units Intravenous Q T,Th,Sa-HD  . feeding supplement (NEPRO CARB STEADY)  237 mL Oral BID BM  . heparin subcutaneous  5,000 Units Subcutaneous Q12H  . insulin aspart  0-5 Units Subcutaneous QHS  . insulin aspart  0-9 Units Subcutaneous TID WC  . levothyroxine  75 mcg Oral QAC breakfast  . metolazone  5 mg Oral Daily  . nystatin   Topical BID  . omega-3 acid ethyl esters  1 g Oral Daily   And  . vitamin E  100 Units Oral Daily  . pantoprazole  40 mg Oral BID  . pregabalin  75 mg Oral BID  . rosuvastatin  20 mg Oral QHS  . sevelamer carbonate  800 mg Oral TID WC  . sodium chloride flush  3 mL Intravenous Q12H  . traZODone  50 mg Oral QHS   acetaminophen **OR** acetaminophen, acetaminophen, albuterol, alum & mag hydroxide-simeth, HYDROcodone-acetaminophen, midodrine, nitroGLYCERIN, ondansetron **OR** ondansetron (ZOFRAN) IV, senna-docusate  Assessment/ Plan:  45 y.o. 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   [facility] [address] TTS  1. ESRD on HD TTHS: Patient only agreed to a short hematemesis treatment today. We will plan for his normally scheduled dialysis treatment today. Monitor blood pressure closely during dialysis.  2. Anemia of CKD: hemoglobin low at 8.3, patient to be administered Epogen 10,000 units IV with dialysis today.  3. Secondary Hyperparathyroidism:  Continue Renvela and Sensipar.  4.  Hypotension: blood and urine cultures remain negative at the moment.  Continue 0.9 normal saline at 40 cc per hour.   LOS: 2 Seraphim Affinito 3/18/201710:11 AM

## 2016-01-25 NOTE — Progress Notes (Signed)
HD tx start 

## 2016-01-26 ENCOUNTER — Inpatient Hospital Stay: Payer: Medicare Other

## 2016-01-26 LAB — GLUCOSE, CAPILLARY
GLUCOSE-CAPILLARY: 166 mg/dL — AB (ref 65–99)
GLUCOSE-CAPILLARY: 111 mg/dL — AB (ref 65–99)
GLUCOSE-CAPILLARY: 120 mg/dL — AB (ref 65–99)
Glucose-Capillary: 87 mg/dL (ref 65–99)
Glucose-Capillary: 188 mg/dL — ABNORMAL HIGH (ref 65–99)

## 2016-01-26 LAB — PARATHYROID HORMONE, INTACT (NO CA): PTH: 60 pg/mL (ref 15–65)

## 2016-01-26 MED ORDER — NYSTATIN 100000 UNIT/GM EX POWD
Freq: Two times a day (BID) | CUTANEOUS | Status: DC
  Administered 2016-01-26 – 2016-02-03 (×15): via TOPICAL
  Filled 2016-01-26: qty 15

## 2016-01-26 MED ORDER — MORPHINE SULFATE (PF) 2 MG/ML IV SOLN
2.0000 mg | INTRAVENOUS | Status: DC | PRN
  Administered 2016-01-26 – 2016-01-29 (×3): 2 mg via INTRAVENOUS
  Filled 2016-01-26 (×3): qty 1

## 2016-01-26 MED ORDER — DIAZEPAM 2 MG PO TABS
2.0000 mg | ORAL_TABLET | Freq: Three times a day (TID) | ORAL | Status: DC | PRN
  Administered 2016-01-26 – 2016-01-29 (×3): 2 mg via ORAL
  Filled 2016-01-26 (×4): qty 1

## 2016-01-26 NOTE — Progress Notes (Signed)
Called Dr. Judithann Sheen about about the patient still having back pain even after medications.  He gave me an order for Morphine

## 2016-01-26 NOTE — Care Management Note (Signed)
Case Management Note  Patient Details  Name: Taylor [name] MRN: [id number] Date of Birth: Jan 26, 1971  Subjective/Objective:      From [facility].              Action/Plan:   Expected Discharge Date:                  Expected Discharge Plan:     In-House Referral:     Discharge planning Services     Post Acute Care Choice:    Choice offered to:     DME Arranged:    DME Agency:     HH Arranged:    HH Agency:     Status of Service:     Medicare Important Message Given:    Date Medicare IM Given:    Medicare IM give by:    Date Additional Medicare IM Given:    Additional Medicare Important Message give by:     If discussed at Long Length of Stay Meetings, dates discussed:    Additional Comments:  Jarrid Lienhard A, RN 01/26/2016, 1:26 PM

## 2016-01-26 NOTE — Care Management Important Message (Signed)
Important Message  Patient Details  Name: Taylor [name] MRN: [id number] Date of Birth: 09/20/1971   Medicare Important Message Given:  Yes    Chesney Klimaszewski A, RN 01/26/2016, 2:29 PM

## 2016-01-26 NOTE — Progress Notes (Signed)
Physical Therapy Evaluation Patient Details Name: Taylor [name] MRN: [id number] DOB: [birth year] Today's Date: 01/26/2016   History of Present Illness  Patient is a 45 y.o. male admitted on 23 January 2016 after experiencing hypotension during dialysis. PMH includes HTN, DM, ESRD on hemodialysis, PVD s/p toe amputation, and diastolic CHF.  Clinical Impression  Upon evaluation, patient's BP 99/56 mmHg resting in bed with SpO2 90% on 3L O2. Patient is a poor historian, initially stating that he lives at home with father/mother. Notes that he uses lift to move from bed to [facility] with assistance from father and a computer chair to get to car because house is not handicap accessible. Has a wheelchair for community mobility. Later admits to being admitted to hospital from [facility]. Upon evaluation, patient demonstrates decreased strength, unable to move against gravity. Patient required moderate assistance to perform bed mobility with moderate fatigue. Patient has impairments of pain, decreased muscle strength, mobility, cardiopulmonary endurance, and function. Patient will benefit from progressive and skilled PT to return to PLOF. When patient is medically stable, it is recommended that he go to SNF for further rehabilitation to improve strength and stamina. PT notified nurse of pressure ulcer L lateral heel at end of session. Repositioned in R SL to alleviate pain.    Follow Up Recommendations SNF    Equipment Recommendations  None recommended by PT    Recommendations for Other Services       Precautions / Restrictions Precautions Precautions: Fall Restrictions Weight Bearing Restrictions: No      Mobility  Bed Mobility Overal bed mobility: Needs Assistance Bed Mobility: Rolling Rolling: Mod assist         General bed mobility comments: Patient requires moderate assistance to roll to side. Utilizes bed rails to pull upper body.  Transfers                     Ambulation/Gait                Stairs            Wheelchair Mobility    Modified Rankin (Stroke Patients Only)       Balance                                             Pertinent Vitals/Pain Pain Assessment: 0-10 Pain Score: 7  Pain Location: Back Pain Descriptors / Indicators: Aching Pain Intervention(s): Repositioned;Limited activity within patient's tolerance;Monitored during session    Home Living Family/patient expects to be discharged to:: Skilled nursing facility Living Arrangements: Parent Available Help at Discharge: Available 24 hours/day;Family Type of Home: House Home Access: Level entry Entrance Stairs-Rails: None   Home Layout: One level Home Equipment: Environmental consultant - 2 wheels;Wheelchair - Careers adviser (comment) (Uses computer chair in house to get to car)      Prior Function Level of Independence: Needs assistance   Gait / Transfers Assistance Needed: Patient's father assists  ADL's / Homemaking Assistance Needed: Patient's father assists        Hand Dominance        Extremity/Trunk Assessment   Upper Extremity Assessment: Generalized weakness (3-/5 MMT grossly)           Lower Extremity Assessment: Generalized weakness (3-/5 MMT grossly)         Communication   Communication: No difficulties  Cognition Arousal/Alertness: Awake/alert Behavior During Therapy:  WFL for tasks assessed/performed Overall Cognitive Status: Within Functional Limits for tasks assessed                      General Comments General comments (skin integrity, edema, etc.): Pressure ulcer L lateral heel (Nurse notified)    Exercises        Assessment/Plan    PT Assessment Patient needs continued PT services  PT Diagnosis Difficulty walking;Generalized weakness   PT Problem List Decreased strength;Decreased range of motion;Decreased activity tolerance;Decreased mobility;Decreased knowledge of use of DME;Decreased  safety awareness;Pain;Decreased skin integrity;Cardiopulmonary status limiting activity;Decreased balance  PT Treatment Interventions Gait training;Functional mobility training;Therapeutic activities;Therapeutic exercise;Balance training;Patient/family education   PT Goals (Current goals can be found in the Care Plan section) Acute Rehab PT Goals Patient Stated Goal: "To get better" PT Goal Formulation: With patient Time For Goal Achievement: 02/09/16 Potential to Achieve Goals: Fair    Frequency Min 2X/week   Barriers to discharge Inaccessible home environment      Co-evaluation               End of Session Equipment Utilized During Treatment: Oxygen Activity Tolerance: Patient tolerated treatment well Patient left: in bed;with call bell/phone within reach;with bed alarm set Nurse Communication: Mobility status;Other (comment) (Informed of pressure ulcer)         Time: 9211-9417 PT Time Calculation (min) (ACUTE ONLY): 25 min   Charges:   PT Evaluation $PT Eval Moderate Complexity: 1 Procedure     PT G Codes:        Neita Carp, PT, DPT 01/26/2016, 11:48 AM

## 2016-01-26 NOTE — Progress Notes (Signed)
[facility] -  at [facility]   PATIENT NAME: Taylor [name]    MR#:  [id number]  DATE OF BIRTH:  1971-08-14  SUBJECTIVE:  CHIEF COMPLAINT:   Chief Complaint  Patient presents with  . Hypotension  . Weakness   - Admitted with hypotension after dialysis. Blood pressure is improved. Lab showing urine infection. On antibiotics. -This morning complains of significant left thigh cramps and pain and also low back pain.[name]  REVIEW OF SYSTEMS:  Review of Systems  Constitutional: Negative for fever and chills.  HENT: Negative for ear discharge, ear pain and nosebleeds.   Eyes: Negative for blurred vision and double vision.  Respiratory: Negative for cough, shortness of breath and wheezing.   Cardiovascular: Negative for chest pain, palpitations and leg swelling.       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 Negative for nausea, vomiting, abdominal pain and constipation.  Genitourinary: Negative for dysuria and urgency.       Has a suprapubic catheter  Musculoskeletal: Positive for myalgias.       Left thigh cramps and pain  Neurological: Positive for focal weakness and weakness. Negative for dizziness, sensory change, speech change, seizures and headaches.       Bilateral lower extremity weakness  Psychiatric/Behavioral: Negative for depression.    DRUG ALLERGIES:   Allergies  Allergen Reactions  . Amoxicillin-Pot Clavulanate Nausea And Vomiting and Itching  . 2nd Skin Quick Heal Other (See Comments)  . Rifampin Nausea And Vomiting  . Tape Other (See Comments)    VITALS:  Blood pressure 99/53, pulse 80, temperature 97.7 F (36.5 C), temperature source Oral, resp. rate 18, height 6\' 3"  (1.905 m), weight 80 kg (176 lb 5.9 oz), SpO2 100 %.  PHYSICAL EXAMINATION:  Physical Exam  GENERAL:  45 y.o.-year-old patient lying in the bed with no acute distress.  EYES: Pupils  equal, round, reactive to light and accommodation. No scleral icterus. Extraocular muscles intact.  HEENT: Head atraumatic, normocephalic. Oropharynx and nasopharynx clear.  NECK:  Supple, no jugular venous distention. No thyroid enlargement, no tenderness.  LUNGS: Normal breath sounds bilaterally, no wheezing, rales,rhonchi or crepitation. No use of accessory muscles of respiration. Decreased bibasilar breath sounds CARDIOVASCULAR: S1, S2 normal. No  rubs, or gallops. Soft 3/6 systolic murmur is present ABDOMEN: Abdomen is distended but Soft, nontender. Bowel sounds present. No organomegaly or mass.  EXTREMITIES: No pedal edema, cyanosis, or clubbing. 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 Some fluid retention in both eyes seen NEUROLOGIC: Cranial nerves II through XII are intact. No new focal motor deficits. Strength in both upper extremities is 5/5 but lower extremities are 2/5.57 Sensation intact. Gait not checked.  PSYCHIATRIC: The patient is alert and oriented x 3.  SKIN: No obvious rash, lesion, or ulcer. Significant erythema in the scrotal area noted.   LABORATORY PANEL:   CBC  Recent Labs Lab 01/23/16 1248  WBC 8.0  HGB 8.3*  HCT 26.5*  PLT 99*   ------------------------------------------------------------------------------------------------------------------  Chemistries   Recent Labs Lab 01/23/16 0842  01/24/16 0031  NA 133*  --  130*  K 4.2  --  5.1  CL 96*  --  97*  CO2 26  --  25  GLUCOSE 62*  --  182*  BUN 32*  --  35*  CREATININE 4.03*  < > 4.28*  CALCIUM 8.4*  --  7.7*  AST 26  --   --  ALT 14*  --   --   ALKPHOS 515*  --   --   BILITOT 1.2  --   --   < > = values in this interval not displayed. ------------------------------------------------------------------------------------------------------------------  Cardiac Enzymes  Recent Labs Lab 01/24/16 0031  TROPONINI 0.09*    ------------------------------------------------------------------------------------------------------------------  RADIOLOGY:  No results found.  EKG:   Orders placed or performed during the hospital encounter of 01/23/16  . ED EKG  . ED EKG  . EKG 12-Lead  . EKG 12-Lead  . EKG 12-Lead  . EKG 12-Lead    ASSESSMENT AND PLAN:   45 year old male with known history of hypertension, diabetes mellitus, end-stage renal disease on hemodialysis, history of C. difficile colitis in December 2016, peripheral vascular disease status post amputation of toes admitted to the hospital secondary to hypotension noted after dialysis.  #1 Hypotension- hypovolemic, Received fluids. Improved -Monitor closely as patient has diastolic CHF and can get fluid overloaded. - on midodrine - for dialysis again Tuesday per schedule  #2 diarrhea-Improved, no further diarrhea now. Negative c.diff. last C. difficile was positive in December.  - Continue probiotics while on antibiotics  #3 End-stage renal disease on hemodialysis-on Tuesday, Thursday and Saturday hemodialysis.  - Nephrology consulted -Dialysis next is Tuesday  per schedule  #4 chronic diastolic congestive heart failure- well compensated -Chest x-ray with findings of pulmonary vascular congestion noted - on chronically on 3l o2.  #5 diabetes mellitus- On lantus now, monitor sugars as has a tendency for hypoglycemia. Continue sliding scale insulin and monitor sugars for now.  #6 UTI- has chronic suprapubic catheter - changed on admission - Cultures pending. Continue Rocephin.   #7 GERD- protonix  #8 DVT Prophylaxis- SQ heparin  #9 Left thigh pain- likely muscle spasms- venous doppler to r/o clot and also valium prn for muscle spasms  Patient is from [facility]. Possible discharge tomorrow.   All the records are reviewed and case discussed with Care Management/Social Workerr. Management plans discussed with the patient, family  and they are in agreement.  CODE STATUS: Full Code  TOTAL TIME TAKING CARE OF THIS PATIENT: 37 minutes.   POSSIBLE D/C IN 1 DAYS, DEPENDING ON CLINICAL CONDITION.   Enid Baas M.D on 01/26/2016 at 12:45 PM  Between 7am to 6pm - Pager - [phone]  After 6pm go to [email or web address] - password [email or web address] [facility]  [facility]  Office  [phone]  CC: Primary care physician; [name], MD

## 2016-01-26 NOTE — Progress Notes (Signed)
[facility]  ROUNDING NOTE   Subjective:  Pt had HD yesterday. Having lower back pain today.  Objective:  Vital signs in last 24 hours:  Temp:  [97.7 F (36.5 C)-97.9 F (36.6 C)] 97.7 F (36.5 C) (03/18 2207) Pulse Rate:  [72-80] 80 (03/18 2207) Resp:  [11-18] 18 (03/18 2207) BP: (99-119)/(53-66) 99/53 mmHg (03/18 2207) SpO2:  [100 %] 100 % (03/18 2207) Weight:  [80 kg (176 lb 5.9 oz)] 80 kg (176 lb 5.9 oz) (03/18 1645)  Weight change: -2.9 kg (-6 lb 6.3 oz) Filed Weights   01/25/16 0509 01/25/16 1320 01/25/16 1645  Weight: 83.235 kg (183 lb 8 oz) 83 kg (182 lb 15.7 oz) 80 kg (176 lb 5.9 oz)    Intake/Output: I/O last 3 completed shifts: In: 2611.1 [P.O.:840; I.V.:1771.1] Out: 3032 [Urine:30; Other:3000; Stool:2]   Intake/Output this shift:  Total I/O In: 243 [P.O.:240; I.V.:3] Out: -   Physical Exam: General: Chronically ill appearing  Head: Normocephalic, atraumatic. Moist oral mucosal membranes  Eyes: Anicteric, PERRL  Neck: Supple, trachea midline  Lungs:  Clear to auscultation normal effort  Heart: S1S2 no rubs  Abdomen:  Soft, nontender, BS present   Extremities: 1+ peripheral edema.  Neurologic: Nonfocal, moving all four extremities  Skin: No lesions  Access: LUE AVF    Basic Metabolic Panel:  Recent Labs Lab 01/23/16 0842 01/23/16 1248 01/24/16 0031 01/25/16 1358  NA 133*  --  130*  --   K 4.2  --  5.1  --   CL 96*  --  97*  --   CO2 26  --  25  --   GLUCOSE 62*  --  182*  --   BUN 32*  --  35*  --   CREATININE 4.03* 4.10* 4.28*  --   CALCIUM 8.4*  --  7.7*  --   PHOS  --   --   --  4.5    Liver Function Tests:  Recent Labs Lab 01/23/16 0842  AST 26  ALT 14*  ALKPHOS 515*  BILITOT 1.2  PROT 7.0  ALBUMIN 2.1*   No results for input(s): LIPASE, AMYLASE in the last 168 hours. No results for input(s): AMMONIA in the last 168 hours.  CBC:  Recent Labs Lab 01/23/16 0842 01/23/16 1248  WBC 7.0 8.0  NEUTROABS 5.5   --   HGB 8.9* 8.3*  HCT 28.1* 26.5*  MCV 89.7 90.4  PLT 106* 99*    Cardiac Enzymes:  Recent Labs Lab 01/23/16 0842 01/23/16 1248 01/23/16 1818 01/24/16 0031  TROPONINI 0.12* 0.11* 0.15* 0.09*    BNP: Invalid input(s): POCBNP  CBG:  Recent Labs Lab 01/25/16 2005 01/25/16 2334 01/26/16 0412 01/26/16 0755 01/26/16 1151  GLUCAP 212* 224* 166* 111* 87    Microbiology: Results for orders placed or performed during the hospital encounter of 01/23/16  Blood culture (routine x 2)     Status: None (Preliminary result)   Collection Time: 01/23/16  8:42 AM  Result Value Ref Range Status   Specimen Description BLOOD RIGHT AC  Final   Special Requests   Final    BOTTLES DRAWN AEROBIC AND ANAEROBIC AER ANA   Culture NO GROWTH 3 DAYS  Final   Report Status PENDING  Incomplete  Blood culture (routine x 2)     Status: None (Preliminary result)   Collection Time: 01/23/16  8:42 AM  Result Value Ref Range Status   Specimen Description BLOOD RIGHT HAND  Final  Special Requests   Final    BOTTLES DRAWN AEROBIC AND ANAEROBIC AER ANA   Culture NO GROWTH 3 DAYS  Final   Report Status PENDING  Incomplete  Urine culture     Status: None (Preliminary result)   Collection Time: 01/23/16  8:42 AM  Result Value Ref Range Status   Specimen Description URINE, CATHETERIZED  Final   Special Requests Normal  Final   Culture HOLDING FOR POSSIBLE PATHOGEN  Final   Report Status PENDING  Incomplete  C difficile quick scan w PCR reflex     Status: None   Collection Time: 01/23/16  5:51 PM  Result Value Ref Range Status   C Diff antigen NEGATIVE NEGATIVE Final   C Diff toxin NEGATIVE NEGATIVE Final   C Diff interpretation Negative for C. difficile  Final    Coagulation Studies: No results for input(s): LABPROT, INR in the last 72 hours.  Urinalysis: No results for input(s): COLORURINE, LABSPEC, PHURINE, GLUCOSEU, HGBUR, BILIRUBINUR, KETONESUR, PROTEINUR, UROBILINOGEN,  NITRITE, LEUKOCYTESUR in the last 72 hours.  Invalid input(s): APPERANCEUR    Imaging: No results found.   Medications:   . sodium chloride 40 mL/hr at 01/25/16 2352   . acidophilus  1 capsule Oral Daily  . amitriptyline  25 mg Oral QHS  . aspirin EC  81 mg Oral Daily  . calcium-vitamin D  1 tablet Oral Daily  . cefTRIAXone (ROCEPHIN)  IV  1 g Intravenous Q24H  . chlorhexidine  15 mL Mouth Rinse BID  . cinacalcet  30 mg Oral Daily  . citalopram  20 mg Oral Daily  . clopidogrel  75 mg Oral Daily  . docusate sodium  100 mg Oral BID  . epoetin (EPOGEN/PROCRIT) injection  10,000 Units Intravenous Q T,Th,Sa-HD  . feeding supplement (NEPRO CARB STEADY)  237 mL Oral BID BM  . heparin subcutaneous  5,000 Units Subcutaneous Q12H  . insulin aspart  0-5 Units Subcutaneous QHS  . insulin aspart  0-9 Units Subcutaneous TID WC  . insulin glargine  10 Units Subcutaneous QHS  . levothyroxine  75 mcg Oral QAC breakfast  . metolazone  5 mg Oral Daily  . nystatin   Topical BID  . nystatin   Topical BID  . omega-3 acid ethyl esters  1 g Oral Daily   And  . vitamin E  100 Units Oral Daily  . pantoprazole  40 mg Oral BID  . pregabalin  75 mg Oral BID  . rosuvastatin  20 mg Oral QHS  . sevelamer carbonate  800 mg Oral TID WC  . sodium chloride flush  3 mL Intravenous Q12H  . traZODone  50 mg Oral QHS   acetaminophen **OR** acetaminophen, acetaminophen, albuterol, alum & mag hydroxide-simeth, diazepam, HYDROcodone-acetaminophen, midodrine, nitroGLYCERIN, ondansetron **OR** ondansetron (ZOFRAN) IV, senna-docusate  Assessment/ Plan:  45 y.o. 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   [facility] TTS  1. ESRD on HD TTHS: Patient had HD yesterday, no acute indication for HD today, will plan for HD again on Tuesday.  2. Anemia of CKD: Continue epogen 10000 units  IV with HD.  Hgb below goal at the moment.   3. Secondary Hyperparathyroidism:  Continue Renvela.  PTH low at 60, stop sensipar at this time.  4.  Hypotension: Continue midodrine at this time.  BP 99/53.  Eating well at the moment.  D/C IVFs.   LOS: 3 Taylor [name] 3/19/20174:09 PM

## 2016-01-27 ENCOUNTER — Ambulatory Visit: Payer: Medicare Other | Admitting: Urology

## 2016-01-27 LAB — GLUCOSE, CAPILLARY
GLUCOSE-CAPILLARY: 126 mg/dL — AB (ref 65–99)
GLUCOSE-CAPILLARY: 161 mg/dL — AB (ref 65–99)
GLUCOSE-CAPILLARY: 165 mg/dL — AB (ref 65–99)
GLUCOSE-CAPILLARY: 174 mg/dL — AB (ref 65–99)
GLUCOSE-CAPILLARY: 213 mg/dL — AB (ref 65–99)
GLUCOSE-CAPILLARY: 233 mg/dL — AB (ref 65–99)
Glucose-Capillary: 78 mg/dL (ref 65–99)

## 2016-01-27 MED ORDER — CEFUROXIME AXETIL 250 MG PO TABS
250.0000 mg | ORAL_TABLET | Freq: Two times a day (BID) | ORAL | Status: DC
  Administered 2016-01-27: 250 mg via ORAL
  Filled 2016-01-27 (×2): qty 1

## 2016-01-27 NOTE — Progress Notes (Signed)
[facility] [place] at [facility]   PATIENT NAME: Taylor [name]    MR#:  [id number]  DATE OF BIRTH:  28-Jan-1971  SUBJECTIVE: Patient complains of back pain. Urine cultures are still pending. Still feels very weak and has  Pains al over the body.  CHIEF COMPLAINT:   Chief Complaint  Patient presents with  . Hypotension  . Weakness   - Admitted with hypotension after dialysis. Blood pressure is improved. Lab showing urine infection. On antibiotics. ..  REVIEW OF SYSTEMS:  Review of Systems  Constitutional: Negative for fever and chills.  HENT: Negative for ear discharge, ear pain and nosebleeds.   Eyes: Negative for blurred vision and double vision.  Respiratory: Negative for cough, shortness of breath and wheezing.   Cardiovascular: Negative for chest pain, palpitations and leg swelling.       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 Negative for nausea, vomiting, abdominal pain and constipation.  Genitourinary: Negative for dysuria and urgency.       Has a suprapubic catheter  Musculoskeletal: Positive for myalgias.       Left thigh cramps and pain  Neurological: Positive for focal weakness and weakness. Negative for dizziness, sensory change, speech change, seizures and headaches.       Bilateral lower extremity weakness  Psychiatric/Behavioral: Negative for depression.    DRUG ALLERGIES:   Allergies  Allergen Reactions  . Amoxicillin-Pot Clavulanate Nausea And Vomiting and Itching  . 2nd Skin Quick Heal Other (See Comments)  . Rifampin Nausea And Vomiting  . Tape Other (See Comments)    VITALS:  Blood pressure 114/64, pulse 85, temperature 98.6 F (37 C), temperature source Oral, resp. rate 20, height 6\' 3"  (1.905 m), weight 80 kg (176 lb 5.9 oz), SpO2 100 %.  PHYSICAL EXAMINATION:  Physical Exam  GENERAL:  45 y.o.-year-old patient lying in the bed with no  acute distress.  Chronically ill-l looking male.  EYES: Pupils equal, round, reactive to light and accommodation. No scleral icterus. Extraocular muscles intact.  HEENT: Head atraumatic, normocephalic. Oropharynx and nasopharynx clear.  NECK:  Supple, no jugular venous distention. No thyroid enlargement, no tenderness.  LUNGS: Normal breath sounds bilaterally, no wheezing, rales,rhonchi or crepitation. No use of accessory muscles of respiration. Decreased bibasilar breath sounds CARDIOVASCULAR: S1, S2 normal. No  rubs, or gallops. Soft 3/6 systolic murmur is present ABDOMEN: Abdomen is distended but Soft, nontender. Bowel sounds present. No organomegaly or mass.  EXTREMITIES: No pedal edema, cyanosis, or clubbing. 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 Some fluid retention in both eyes seen NEUROLOGIC: Cranial nerves II through XII are intact. No new focal motor deficits. Strength in both upper extremities is 5/5 but lower extremities are 2/5.57 Sensation intact. Gait not checked.  PSYCHIATRIC: The patient is alert and oriented x 3.  SKIN: No obvious rash, lesion, or ulcer. Significant erythema in the scrotal area noted.   LABORATORY PANEL:   CBC  Recent Labs Lab 01/23/16 1248  WBC 8.0  HGB 8.3*  HCT 26.5*  PLT 99*   ------------------------------------------------------------------------------------------------------------------  Chemistries   Recent Labs Lab 01/23/16 0842  01/24/16 0031  NA 133*  --  130*  K 4.2  --  5.1  CL 96*  --  97*  CO2 26  --  25  GLUCOSE 62*  --  182*  BUN 32*  --  35*  CREATININE 4.03*  < >  4.28*  CALCIUM 8.4*  --  7.7*  AST 26  --   --   ALT 14*  --   --   ALKPHOS 515*  --   --   BILITOT 1.2  --   --   < > = values in this interval not displayed. ------------------------------------------------------------------------------------------------------------------  Cardiac Enzymes  Recent Labs Lab  01/24/16 0031  TROPONINI 0.09*   ------------------------------------------------------------------------------------------------------------------  RADIOLOGY:  US Venous Img Lower Unilateral Left  01/26/2016  CLINICAL DATA:  Left thigh pain EXAM: Left LOWER EXTREMITY VENOUS DOPPLER ULTRASOUND TECHNIQUE: Gray-scale sonography with graded compression, as well as color Doppler and duplex ultrasound were performed to evaluate the lower extremity deep venous systems from the level of the common femoral vein and including the common femoral, femoral, profunda femoral, popliteal and calf veins including the posterior tibial, peroneal and gastrocnemius veins when visible. The superficial great saphenous vein was also interrogated. Spectral Doppler was utilized to evaluate flow at rest and with distal augmentation maneuvers in the common femoral, femoral and popliteal veins. COMPARISON:  None. FINDINGS: Contralateral Common Femoral Vein: Respiratory phasicity is normal and symmetric with the symptomatic side. No evidence of thrombus. Normal compressibility. Common Femoral Vein: No evidence of thrombus. Normal compressibility, respiratory phasicity and response to augmentation. Saphenofemoral Junction: No evidence of thrombus. Normal compressibility and flow on color Doppler imaging. Profunda Femoral Vein: No evidence of thrombus. Normal compressibility and flow on color Doppler imaging. Femoral Vein: No evidence of thrombus. Normal compressibility, respiratory phasicity and response to augmentation. Popliteal Vein: No evidence of thrombus. Normal compressibility, respiratory phasicity and response to augmentation. Calf Veins: No evidence of thrombus. Normal compressibility and flow on color Doppler imaging. Superficial Great Saphenous Vein: No evidence of thrombus. Normal compressibility and flow on color Doppler imaging. Venous Reflux:  None. Other Findings: Inguinal lymph nodes are identified likely reactive in  nature. Large knee joint effusion is noted. IMPRESSION: No evidence of deep venous thrombosis. Large knee joint effusion. Electronically Signed   By: Alcide Clever M.D.   On: 01/26/2016 16:25    EKG:   Orders placed or performed during the hospital encounter of 01/23/16  . ED EKG  . ED EKG  . EKG 12-Lead  . EKG 12-Lead  . EKG 12-Lead  . EKG 12-Lead    ASSESSMENT AND PLAN:   45 year old male with known history of hypertension, diabetes mellitus, end-stage renal disease on hemodialysis, history of C. difficile colitis in December 2016, peripheral vascular disease status post amputation of toes admitted to the hospital secondary to hypotension noted after dialysis.  #1 Hypotension- hypovolemic, Received fluids. Improved -Monitor closely as patient has diastolic CHF and can get fluid overloaded. - on midodrine - for dialysis again Tuesday per schedule  #2 diarrhea-Improved, no further diarrhea now. Negative c.diff. last C. difficile was positive in December.  - Continue probiotics while on antibiotics  #3 End-stage renal disease on hemodialysis-on Tuesday, Thursday and Saturday hemodialysis.  - Nephrology consulted -Dialysis next is Tuesday  per schedule  #4 chronic diastolic congestive heart failure- well compensated -Chest x-ray with findings of pulmonary vascular congestion noted - on chronically on 3l o2.  #5 diabetes mellitus- On lantus now, monitor sugars as has a tendency for hypoglycemia. Continue sliding scale insulin and monitor sugars for now.  #6 UTI- has chronic suprapubic catheter - changed on admission -  urine Cultures pending. Continue Rocephin.   #7 GERD- protonix  #8 DVT Prophylaxis- SQ heparin  #9 Left thigh pain- likely muscle  spasms- ultrasound is negative for DVT.  Follow urine cultures and [facility] are available..   All the records are reviewed and case discussed with Care Management/Social  Workerr. Management plans discussed with the patient, family and they are in agreement.  CODE STATUS: Full Code  TOTAL TIME TAKING CARE OF THIS PATIENT: 25 minutes.   POSSIBLE D/C IN 1 DAYS, DEPENDING ON CLINICAL CONDITION.   Katha Hamming M.D on 01/27/2016 at 10:51 AM  Between 7am to 6pm - Pager - [phone]  After 6pm go to [email or web address] - password [email or web address] [facility]  [place] [facility]  Office  [phone]  CC: Primary care physician; [name], MD

## 2016-01-27 NOTE — Progress Notes (Signed)
[facility]  ROUNDING NOTE   Subjective:  Pt had HD yesterday. Having lower back pain today. Bilateral lower extremity duplex negative for DVT Objective:  Vital signs in last 24 hours:  Temp:  [97.8 F (36.6 C)-98.6 F (37 C)] 98.6 F (37 C) (03/20 0823) Pulse Rate:  [85-94] 85 (03/20 0823) Resp:  [20] 20 (03/20 0823) BP: (111-119)/(64-65) 114/64 mmHg (03/20 0823) SpO2:  [100 %] 100 % (03/20 0823)  Weight change:  Filed Weights   01/25/16 0509 01/25/16 1320 01/25/16 1645  Weight: 83.235 kg (183 lb 8 oz) 83 kg (182 lb 15.7 oz) 80 kg (176 lb 5.9 oz)    Intake/Output: I/O last 3 completed shifts: In: 1995 [P.O.:1560; I.V.:435] Out: 40 [Urine:40]   Intake/Output this shift:  Total I/O In: 600 [P.O.:600] Out: 75 [Urine:75]  Physical Exam: General: Chronically ill appearing  Head: Normocephalic, atraumatic. Moist oral mucosal membranes  Eyes: Anicteric  Neck: Supple, trachea midline  Lungs:  Clear to auscultation normal effort  Heart: S1S2 no rubs  Abdomen:  Soft, nontender, distended, BS present   Extremities: 1+ peripheral edema.  Neurologic: Nonfocal, moving all four extremities  Skin: No lesions  Access: LUE AVF    Basic Metabolic Panel:  Recent Labs Lab 01/23/16 0842 01/23/16 1248 01/24/16 0031 01/25/16 1358  NA 133*  --  130*  --   K 4.2  --  5.1  --   CL 96*  --  97*  --   CO2 26  --  25  --   GLUCOSE 62*  --  182*  --   BUN 32*  --  35*  --   CREATININE 4.03* 4.10* 4.28*  --   CALCIUM 8.4*  --  7.7*  --   PHOS  --   --   --  4.5    Liver Function Tests:  Recent Labs Lab 01/23/16 0842  AST 26  ALT 14*  ALKPHOS 515*  BILITOT 1.2  PROT 7.0  ALBUMIN 2.1*   No results for input(s): LIPASE, AMYLASE in the last 168 hours. No results for input(s): AMMONIA in the last 168 hours.  CBC:  Recent Labs Lab 01/23/16 0842 01/23/16 1248  WBC 7.0 8.0  NEUTROABS 5.5  --   HGB 8.9* 8.3*  HCT 28.1* 26.5*  MCV 89.7 90.4  PLT 106*  99*    Cardiac Enzymes:  Recent Labs Lab 01/23/16 0842 01/23/16 1248 01/23/16 1818 01/24/16 0031  TROPONINI 0.12* 0.11* 0.15* 0.09*    BNP: Invalid input(s): POCBNP  CBG:  Recent Labs Lab 01/26/16 1652 01/26/16 1941 01/27/16 0018 01/27/16 0401 01/27/16 0819  GLUCAP 120* 188* 213* 126* 78    Microbiology: Results for orders placed or performed during the hospital encounter of 01/23/16  Blood culture (routine x 2)     Status: None (Preliminary result)   Collection Time: 01/23/16  8:42 AM  Result Value Ref Range Status   Specimen Description BLOOD RIGHT AC  Final   Special Requests   Final    BOTTLES DRAWN AEROBIC AND ANAEROBIC AER ANA   Culture NO GROWTH 4 DAYS  Final   Report Status PENDING  Incomplete  Blood culture (routine x 2)     Status: None (Preliminary result)   Collection Time: 01/23/16  8:42 AM  Result Value Ref Range Status   Specimen Description BLOOD RIGHT HAND  Final   Special Requests   Final    BOTTLES DRAWN AEROBIC AND ANAEROBIC AER ANA    Culture NO GROWTH 4 DAYS  Final   Report Status PENDING  Incomplete  Urine culture     Status: None (Preliminary result)   Collection Time: 01/23/16  8:42 AM  Result Value Ref Range Status   Specimen Description URINE, CATHETERIZED  Final   Special Requests Normal  Final   Culture HOLDING FOR POSSIBLE PATHOGEN  Final   Report Status PENDING  Incomplete  C difficile quick scan w PCR reflex     Status: None   Collection Time: 01/23/16  5:51 PM  Result Value Ref Range Status   C Diff antigen NEGATIVE NEGATIVE Final   C Diff toxin NEGATIVE NEGATIVE Final   C Diff interpretation Negative for C. difficile  Final    Coagulation Studies: No results for input(s): LABPROT, INR in the last 72 hours.  Urinalysis: No results for input(s): COLORURINE, LABSPEC, PHURINE, GLUCOSEU, HGBUR, BILIRUBINUR, KETONESUR, PROTEINUR, UROBILINOGEN, NITRITE, LEUKOCYTESUR in the last 72 hours.  Invalid input(s):  APPERANCEUR    Imaging: US Venous Img Lower Unilateral Left  01/26/2016  CLINICAL DATA:  Left thigh pain EXAM: Left LOWER EXTREMITY VENOUS DOPPLER ULTRASOUND TECHNIQUE: Gray-scale sonography with graded compression, as well as color Doppler and duplex ultrasound were performed to evaluate the lower extremity deep venous systems from the level of the common femoral vein and including the common femoral, femoral, profunda femoral, popliteal and calf veins including the posterior tibial, peroneal and gastrocnemius veins when visible. The superficial great saphenous vein was also interrogated. Spectral Doppler was utilized to evaluate flow at rest and with distal augmentation maneuvers in the common femoral, femoral and popliteal veins. COMPARISON:  None. FINDINGS: Contralateral Common Femoral Vein: Respiratory phasicity is normal and symmetric with the symptomatic side. No evidence of thrombus. Normal compressibility. Common Femoral Vein: No evidence of thrombus. Normal compressibility, respiratory phasicity and response to augmentation. Saphenofemoral Junction: No evidence of thrombus. Normal compressibility and flow on color Doppler imaging. Profunda Femoral Vein: No evidence of thrombus. Normal compressibility and flow on color Doppler imaging. Femoral Vein: No evidence of thrombus. Normal compressibility, respiratory phasicity and response to augmentation. Popliteal Vein: No evidence of thrombus. Normal compressibility, respiratory phasicity and response to augmentation. Calf Veins: No evidence of thrombus. Normal compressibility and flow on color Doppler imaging. Superficial Great Saphenous Vein: No evidence of thrombus. Normal compressibility and flow on color Doppler imaging. Venous Reflux:  None. Other Findings: Inguinal lymph nodes are identified likely reactive in nature. Large knee joint effusion is noted. IMPRESSION: No evidence of deep venous thrombosis. Large knee joint effusion. Electronically  Signed   By: Alcide Clever M.D.   On: 01/26/2016 16:25     Medications:     . acidophilus  1 capsule Oral Daily  . amitriptyline  25 mg Oral QHS  . aspirin EC  81 mg Oral Daily  . calcium-vitamin D  1 tablet Oral Daily  . cefTRIAXone (ROCEPHIN)  IV  1 g Intravenous Q24H  . chlorhexidine  15 mL Mouth Rinse BID  . citalopram  20 mg Oral Daily  . clopidogrel  75 mg Oral Daily  . docusate sodium  100 mg Oral BID  . epoetin (EPOGEN/PROCRIT) injection  10,000 Units Intravenous Q T,Th,Sa-HD  . feeding supplement (NEPRO CARB STEADY)  237 mL Oral BID BM  . heparin subcutaneous  5,000 Units Subcutaneous Q12H  . insulin aspart  0-5 Units Subcutaneous QHS  . insulin aspart  0-9 Units Subcutaneous TID WC  . insulin glargine  10 Units  Subcutaneous QHS  . levothyroxine  75 mcg Oral QAC breakfast  . metolazone  5 mg Oral Daily  . nystatin   Topical BID  . nystatin   Topical BID  . omega-3 acid ethyl esters  1 g Oral Daily   And  . vitamin E  100 Units Oral Daily  . pantoprazole  40 mg Oral BID  . pregabalin  75 mg Oral BID  . rosuvastatin  20 mg Oral QHS  . sevelamer carbonate  800 mg Oral TID WC  . sodium chloride flush  3 mL Intravenous Q12H  . traZODone  50 mg Oral QHS   acetaminophen **OR** acetaminophen, acetaminophen, albuterol, alum & mag hydroxide-simeth, diazepam, HYDROcodone-acetaminophen, midodrine, morphine injection, nitroGLYCERIN, ondansetron **OR** ondansetron (ZOFRAN) IV, senna-docusate  Assessment/ Plan:  45 y.o. 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   [facility] TTS  1. ESRD on HD TTHS: Patient last completed dialysis on Saturday. No acute indication for dialysis today. We will plan for dialysis again tomorrow.  2. Anemia of CKD: Continue epogen 10000 units IV with HD.   3. Secondary Hyperparathyroidism:  Continue Renvela.  PTH low at 60, therefore sensipar stopped.  4.  Hypotension: Blood pressure better today at 114/64. Continue midodrine.   LOS: 4 Niva Murren 3/20/201711:30 AM

## 2016-01-28 ENCOUNTER — Telehealth: Payer: Self-pay | Admitting: *Deleted

## 2016-01-28 LAB — CBC
HCT: 24.6 % — ABNORMAL LOW (ref 40.0–52.0)
Hemoglobin: 7.9 g/dL — ABNORMAL LOW (ref 13.0–18.0)
MCH: 28.5 pg (ref 26.0–34.0)
MCHC: 32 g/dL (ref 32.0–36.0)
MCV: 89 fL (ref 80.0–100.0)
PLATELETS: 125 10*3/uL — AB (ref 150–440)
RBC: 2.76 MIL/uL — AB (ref 4.40–5.90)
RDW: 17.6 % — AB (ref 11.5–14.5)
WBC: 8.8 10*3/uL (ref 3.8–10.6)

## 2016-01-28 LAB — CULTURE, BLOOD (ROUTINE X 2)
CULTURE: NO GROWTH
CULTURE: NO GROWTH

## 2016-01-28 LAB — GLUCOSE, CAPILLARY
GLUCOSE-CAPILLARY: 125 mg/dL — AB (ref 65–99)
GLUCOSE-CAPILLARY: 77 mg/dL (ref 65–99)
GLUCOSE-CAPILLARY: 82 mg/dL (ref 65–99)
Glucose-Capillary: 174 mg/dL — ABNORMAL HIGH (ref 65–99)
Glucose-Capillary: 233 mg/dL — ABNORMAL HIGH (ref 65–99)

## 2016-01-28 LAB — URINE CULTURE
Culture: >=100000
Special Requests: NORMAL

## 2016-01-28 MED ORDER — DEXTROSE 50 % IV SOLN
25.0000 g | Freq: Once | INTRAVENOUS | Status: AC
  Administered 2016-01-28: 25 g via INTRAVENOUS
  Filled 2016-01-28: qty 50

## 2016-01-28 MED ORDER — NALOXONE HCL 0.4 MG/ML IJ SOLN
INTRAMUSCULAR | Status: AC
  Administered 2016-01-28: 0.4 mg
  Filled 2016-01-28: qty 1

## 2016-01-28 MED ORDER — FLUCONAZOLE 100 MG PO TABS
100.0000 mg | ORAL_TABLET | Freq: Every day | ORAL | Status: DC
  Administered 2016-01-28 – 2016-02-02 (×4): 100 mg via ORAL
  Filled 2016-01-28 (×5): qty 1

## 2016-01-28 MED ORDER — NALOXONE HCL 0.4 MG/ML IJ SOLN: 0.4000 mg | INTRAMUSCULAR | Status: DC | PRN

## 2016-01-28 NOTE — Progress Notes (Signed)
Hemodialysis start 

## 2016-01-28 NOTE — Progress Notes (Signed)
Pre-hd tx 

## 2016-01-28 NOTE — Progress Notes (Signed)
Pt back from HD. Still not alert and oriented. Pt is talking out of his head and his responses are still not appropriate.  Pt is able to wake up enough to take his PO medications and swallow water. MD's note says not to check anymore labs at this time.

## 2016-01-28 NOTE — Progress Notes (Signed)
Hd Tx Post Assessment

## 2016-01-28 NOTE — Clinical Documentation Improvement (Signed)
Hospitalist at [facility]  (Please document query responses in the current medical record, not on the CDI BPA report.  Thank you.)  Nursing staff, the registered dietician and physical therapy documentation reports that the patient has a Stage 2 sacral pressure ulcer and a Stage 1 left lateral heel ulcer.  If you agree with their assessments, please document the location and stage of the noted ulcers in the current medical record.  Clinical information/indicators: Nursing Flowsheets 01/23/16 at 1624 - "Stage II sacral ulcer" "PT notified nurse of pressure ulcer L lateral heel at end of session" documented in PT note dated 01/26/16 at 11:53 am "Skin: stage II pressure ulcer on buttock, stage I on heel" documented in registered dietician assessment dated 01/24/16 at 3:32 pm   Please exercise your independent, professional judgment when responding. A specific answer is not anticipated or expected.   Thank You, Jerral Ralph  RN BSN CCDS [phone] Health Information Management [place]

## 2016-01-28 NOTE — Progress Notes (Signed)
PT Cancellation Note  Patient Details Name: Taylor [name] MRN: [id number] DOB: 17-Aug-1971   Cancelled Treatment:    Reason Eval/Treat Not Completed: Patient at procedure or test/unavailable (Patient currently off unit for dialysis and unavailable for participation with therapy session.  Will re-attempt at later time/date as patient available and medically appropriate.)   Kalani Baray H. Manson Passey, PT, DPT, NCS 01/28/2016, 1:32 PM [phone]

## 2016-01-28 NOTE — Progress Notes (Signed)
HD Tx Termination 

## 2016-01-28 NOTE — Progress Notes (Signed)
[facility]  ROUNDING NOTE   Subjective:  Patient completed hemodialysis today. He appeared to have tolerated this well.   Objective:  Vital signs in last 24 hours:  Temp:  [98 F (36.7 C)-99.6 F (37.6 C)] 98.7 F (37.1 C) (03/21 1408) Pulse Rate:  [95-141] 141 (03/21 1408) Resp:  [14-20] 19 (03/21 1408) BP: (100-120)/(52-65) 106/58 mmHg (03/21 1408) SpO2:  [74 %-100 %] 74 % (03/21 1408) Weight:  [85 kg (187 lb 6.3 oz)-86 kg (189 lb 9.5 oz)] 85 kg (187 lb 6.3 oz) (03/21 1315)  Weight change:  Filed Weights   01/27/16 2058 01/28/16 0930 01/28/16 1315  Weight: 85.775 kg (189 lb 1.6 oz) 86 kg (189 lb 9.5 oz) 85 kg (187 lb 6.3 oz)    Intake/Output: I/O last 3 completed shifts: In: 1441 [P.O.:1438; I.V.:3] Out: 100 [Urine:100]   Intake/Output this shift:  Total I/O In: 0  Out: 2025 [Urine:25; Other:2000]  Physical Exam: General: Chronically ill appearing  Head: Normocephalic, atraumatic. Moist oral mucosal membranes  Eyes: Anicteric  Neck: Supple, trachea midline  Lungs:  Clear to auscultation normal effort  Heart: S1S2 no rubs  Abdomen:  Soft, nontender, distended, BS present   Extremities: 1+ peripheral edema.  Neurologic: Nonfocal, moving all four extremities  Skin: No lesions  Access: LUE AVF    Basic Metabolic Panel:  Recent Labs Lab 01/23/16 0842 01/23/16 1248 01/24/16 0031 01/25/16 1358  NA 133*  --  130*  --   K 4.2  --  5.1  --   CL 96*  --  97*  --   CO2 26  --  25  --   GLUCOSE 62*  --  182*  --   BUN 32*  --  35*  --   CREATININE 4.03* 4.10* 4.28*  --   CALCIUM 8.4*  --  7.7*  --   PHOS  --   --   --  4.5    Liver Function Tests:  Recent Labs Lab 01/23/16 0842  AST 26  ALT 14*  ALKPHOS 515*  BILITOT 1.2  PROT 7.0  ALBUMIN 2.1*   No results for input(s): LIPASE, AMYLASE in the last 168 hours. No results for input(s): AMMONIA in the last 168 hours.  CBC:  Recent Labs Lab 01/23/16 0842 01/23/16 1248  01/28/16 0655  WBC 7.0 8.0 8.8  NEUTROABS 5.5  --   --   HGB 8.9* 8.3* 7.9*  HCT 28.1* 26.5* 24.6*  MCV 89.7 90.4 89.0  PLT 106* 99* 125*    Cardiac Enzymes:  Recent Labs Lab 01/23/16 0842 01/23/16 1248 01/23/16 1818 01/24/16 0031  TROPONINI 0.12* 0.11* 0.15* 0.09*    BNP: Invalid input(s): POCBNP  CBG:  Recent Labs Lab 01/27/16 1956 01/27/16 2128 01/27/16 2353 01/28/16 0337 01/28/16 0819  GLUCAP 174* 233* 233* 174* 125*    Microbiology: Results for orders placed or performed during the hospital encounter of 01/23/16  Blood culture (routine x 2)     Status: None   Collection Time: 01/23/16  8:42 AM  Result Value Ref Range Status   Specimen Description BLOOD RIGHT AC  Final   Special Requests   Final    BOTTLES DRAWN AEROBIC AND ANAEROBIC AER ANA   Culture NO GROWTH 5 DAYS  Final   Report Status 01/28/2016 FINAL  Final  Blood culture (routine x 2)     Status: None   Collection Time: 01/23/16  8:42 AM  Result Value Ref Range Status  Specimen Description BLOOD RIGHT HAND  Final   Special Requests   Final    BOTTLES DRAWN AEROBIC AND ANAEROBIC AER ANA   Culture NO GROWTH 5 DAYS  Final   Report Status 01/28/2016 FINAL  Final  Urine culture     Status: None   Collection Time: 01/23/16  8:42 AM  Result Value Ref Range Status   Specimen Description URINE, CATHETERIZED  Final   Special Requests Normal  Final   Culture >=100,000 COLONIES/mL CANDIDA ALBICANS  Final   Report Status 01/28/2016 FINAL  Final  C difficile quick scan w PCR reflex     Status: None   Collection Time: 01/23/16  5:51 PM  Result Value Ref Range Status   C Diff antigen NEGATIVE NEGATIVE Final   C Diff toxin NEGATIVE NEGATIVE Final   C Diff interpretation Negative for C. difficile  Final    Coagulation Studies: No results for input(s): LABPROT, INR in the last 72 hours.  Urinalysis: No results for input(s): COLORURINE, LABSPEC, PHURINE, GLUCOSEU, HGBUR, BILIRUBINUR,  KETONESUR, PROTEINUR, UROBILINOGEN, NITRITE, LEUKOCYTESUR in the last 72 hours.  Invalid input(s): APPERANCEUR    Imaging: US Venous Img Lower Unilateral Left  01/26/2016  CLINICAL DATA:  Left thigh pain EXAM: Left LOWER EXTREMITY VENOUS DOPPLER ULTRASOUND TECHNIQUE: Gray-scale sonography with graded compression, as well as color Doppler and duplex ultrasound were performed to evaluate the lower extremity deep venous systems from the level of the common femoral vein and including the common femoral, femoral, profunda femoral, popliteal and calf veins including the posterior tibial, peroneal and gastrocnemius veins when visible. The superficial great saphenous vein was also interrogated. Spectral Doppler was utilized to evaluate flow at rest and with distal augmentation maneuvers in the common femoral, femoral and popliteal veins. COMPARISON:  None. FINDINGS: Contralateral Common Femoral Vein: Respiratory phasicity is normal and symmetric with the symptomatic side. No evidence of thrombus. Normal compressibility. Common Femoral Vein: No evidence of thrombus. Normal compressibility, respiratory phasicity and response to augmentation. Saphenofemoral Junction: No evidence of thrombus. Normal compressibility and flow on color Doppler imaging. Profunda Femoral Vein: No evidence of thrombus. Normal compressibility and flow on color Doppler imaging. Femoral Vein: No evidence of thrombus. Normal compressibility, respiratory phasicity and response to augmentation. Popliteal Vein: No evidence of thrombus. Normal compressibility, respiratory phasicity and response to augmentation. Calf Veins: No evidence of thrombus. Normal compressibility and flow on color Doppler imaging. Superficial Great Saphenous Vein: No evidence of thrombus. Normal compressibility and flow on color Doppler imaging. Venous Reflux:  None. Other Findings: Inguinal lymph nodes are identified likely reactive in nature. Large knee joint effusion is  noted. IMPRESSION: No evidence of deep venous thrombosis. Large knee joint effusion. Electronically Signed   By: Alcide Clever M.D.   On: 01/26/2016 16:25     Medications:     . acidophilus  1 capsule Oral Daily  . amitriptyline  25 mg Oral QHS  . aspirin EC  81 mg Oral Daily  . calcium-vitamin D  1 tablet Oral Daily  . cefUROXime  250 mg Oral BID WC  . chlorhexidine  15 mL Mouth Rinse BID  . citalopram  20 mg Oral Daily  . clopidogrel  75 mg Oral Daily  . docusate sodium  100 mg Oral BID  . epoetin (EPOGEN/PROCRIT) injection  10,000 Units Intravenous Q T,Th,Sa-HD  . feeding supplement (NEPRO CARB STEADY)  237 mL Oral BID BM  . heparin subcutaneous  5,000 Units Subcutaneous Q12H  . insulin  aspart  0-5 Units Subcutaneous QHS  . insulin aspart  0-9 Units Subcutaneous TID WC  . insulin glargine  10 Units Subcutaneous QHS  . levothyroxine  75 mcg Oral QAC breakfast  . metolazone  5 mg Oral Daily  . nystatin   Topical BID  . omega-3 acid ethyl esters  1 g Oral Daily   And  . vitamin E  100 Units Oral Daily  . pantoprazole  40 mg Oral BID  . pregabalin  75 mg Oral BID  . rosuvastatin  20 mg Oral QHS  . sevelamer carbonate  800 mg Oral TID WC  . sodium chloride flush  3 mL Intravenous Q12H  . traZODone  50 mg Oral QHS   acetaminophen **OR** acetaminophen, albuterol, alum & mag hydroxide-simeth, diazepam, HYDROcodone-acetaminophen, midodrine, morphine injection, naLOXone (NARCAN)  injection, nitroGLYCERIN, ondansetron **OR** ondansetron (ZOFRAN) IV, senna-docusate  Assessment/ Plan:  45 y.o. 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   [facility] [address] TTS  1. ESRD on HD TTHS: Patient completed dialysis today. Next hematemesis on Thursday if still here.  2. Anemia of CKD: Hemoglobin currently 7.9. Consider transfusion for hemoglobin of 7 or less. Continue epogen 10000 units IV with HD.   3. Secondary Hyperparathyroidism:  Continue Renvela for phosphorous control.  PTH low at 60, therefore sensipar stopped.  4.  Hypotension: Blood pressure today was 106/58 at last check. Continue midodrine.   LOS: 5 Deepika [place] 3/21/20173:10 PM

## 2016-01-28 NOTE — Care Management Important Message (Signed)
Important Message  Patient Details  Name: Taylor [name] MRN: [id number] Date of Birth: 12/28/1970   Medicare Important Message Given:  Yes    Olegario Messier A Deema Juncaj 01/28/2016, 11:37 AM

## 2016-01-28 NOTE — Progress Notes (Addendum)
Md paged regarding pt's mental status and fixed, non reactive pupils. Orders given to check ABG and one time dose of Narcan. Narcan administered per order with primary RN, charge nurse, and NT at bedside. Pt has become more alert and asking questions, but does not know why we are here. Pt is still disoriented and his responses are not appropriate. MD also ordered NOT to give pain medications.

## 2016-01-28 NOTE — Progress Notes (Signed)
Post HD Tx Assessment  

## 2016-01-28 NOTE — Plan of Care (Signed)
Problem: Nutrition: Goal: Adequate nutrition will be maintained Outcome: Not Progressing Pt is not eating meals at this time.

## 2016-01-28 NOTE — Progress Notes (Signed)
[facility] - [place] at [facility]   PATIENT NAME: Taylor [name]    MR#:  [id number]  DATE OF BIRTH:  July 23, 1971  SUBJECTIVE: Unresponsive this morning. Blood sugar more than 100. Received a dose of Narcan and then now he responded back but continues to be disoriented. I ordered ABG but I don't see results. Patient went for dialysis. Isaw  patient in dialysis unit.[name] He was able to talk to me but still kind of lethargic on and off. Stable blood sugars dialysis  Unit was 60.   CHIEF COMPLAINT:   Chief Complaint  Patient presents with  . Hypotension  . Weakness   - Admitted with hypotension after dialysis. Blood pressure is improved. Lab showing urine infection. On antibiotics. ..  REVIEW OF SYSTEMS:  Review of Systems  Unable to perform ROS: mental acuity  Constitutional: Negative for chills.  HENT: Positive for hearing loss. Negative for ear discharge, ear pain and nosebleeds.   Eyes: Negative for blurred vision and double vision.  Respiratory: Negative for cough, shortness of breath and wheezing.   Cardiovascular: Negative for chest pain, palpitations and leg swelling.       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 Negative for nausea, vomiting, abdominal pain and constipation.  Genitourinary: Negative for dysuria and urgency.       Has a suprapubic catheter  Musculoskeletal: Positive for myalgias.       Left thigh cramps and pain  Neurological: Positive for focal weakness and weakness. Negative for dizziness, sensory change, speech change, seizures and headaches.       Bilateral lower extremity weakness  Psychiatric/Behavioral: Negative for depression.    DRUG ALLERGIES:   Allergies  Allergen Reactions  . Amoxicillin-Pot Clavulanate Nausea And Vomiting and Itching  . 2nd Skin Quick Heal Other (See Comments)  . Rifampin Nausea And Vomiting  . Tape Other (See Comments)     VITALS:  Blood pressure 117/64, pulse 96, temperature 98 F (36.7 C), temperature source Oral, resp. rate 20, height 6\' 3"  (1.905 m), weight 86 kg (189 lb 9.5 oz), SpO2 100 %.  PHYSICAL EXAMINATION:  Physical Exam  GENERAL:  45 y.o.-year-old patient lying in the bed with no acute distress.  Chronically ill-l looking male.  EYES: Pupils equal, round, reactive to light and accommodation. No scleral icterus. Extraocular muscles intact.  HEENT: Head atraumatic, normocephalic. Oropharynx and nasopharynx clear.  NECK:  Supple, no jugular venous distention. No thyroid enlargement, no tenderness.  LUNGS: Normal breath sounds bilaterally, no wheezing, rales,rhonchi or crepitation. No use of accessory muscles of respiration. Decreased bibasilar breath sounds CARDIOVASCULAR: S1, S2 normal. No  rubs, or gallops. Soft 3/6 systolic murmur is present ABDOMEN: Abdomen is distended but Soft, nontender. Bowel sounds present. No organomegaly or mass.  EXTREMITIES: No pedal edema, cyanosis, or clubbing. 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 NEUROLOGIC: Unable to do full neurological exam: Due to lethargy.  PSYCHIATRIC;Lethargic today.   SKIN: No obvious rash, lesion, or ulcer. Significant erythema in the scrotal area noted.   LABORATORY PANEL:   CBC  Recent Labs Lab 01/28/16 0655  WBC 8.8  HGB 7.9*  HCT 24.6*  PLT 125*   ------------------------------------------------------------------------------------------------------------------  Chemistries   Recent Labs Lab 01/23/16 0842  01/24/16 0031  NA 133*  --  130*  K 4.2  --  5.1  CL 96*  --  97*  CO2 26  --  25  GLUCOSE 62*  --  182*  BUN 32*  --  35*  CREATININE 4.03*  < > 4.28*  CALCIUM 8.4*  --  7.7*  AST 26  --   --   ALT 14*  --   --   ALKPHOS 515*  --   --   BILITOT 1.2  --   --   < > = values in this interval not  displayed. ------------------------------------------------------------------------------------------------------------------  Cardiac Enzymes  Recent Labs Lab 01/24/16 0031  TROPONINI 0.09*   ------------------------------------------------------------------------------------------------------------------  RADIOLOGY:  US Venous Img Lower Unilateral Left  01/26/2016  CLINICAL DATA:  Left thigh pain EXAM: Left LOWER EXTREMITY VENOUS DOPPLER ULTRASOUND TECHNIQUE: Gray-scale sonography with graded compression, as well as color Doppler and duplex ultrasound were performed to evaluate the lower extremity deep venous systems from the level of the common femoral vein and including the common femoral, femoral, profunda femoral, popliteal and calf veins including the posterior tibial, peroneal and gastrocnemius veins when visible. The superficial great saphenous vein was also interrogated. Spectral Doppler was utilized to evaluate flow at rest and with distal augmentation maneuvers in the common femoral, femoral and popliteal veins. COMPARISON:  None. FINDINGS: Contralateral Common Femoral Vein: Respiratory phasicity is normal and symmetric with the symptomatic side. No evidence of thrombus. Normal compressibility. Common Femoral Vein: No evidence of thrombus. Normal compressibility, respiratory phasicity and response to augmentation. Saphenofemoral Junction: No evidence of thrombus. Normal compressibility and flow on color Doppler imaging. Profunda Femoral Vein: No evidence of thrombus. Normal compressibility and flow on color Doppler imaging. Femoral Vein: No evidence of thrombus. Normal compressibility, respiratory phasicity and response to augmentation. Popliteal Vein: No evidence of thrombus. Normal compressibility, respiratory phasicity and response to augmentation. Calf Veins: No evidence of thrombus. Normal compressibility and flow on color Doppler imaging. Superficial Great Saphenous Vein: No evidence  of thrombus. Normal compressibility and flow on color Doppler imaging. Venous Reflux:  None. Other Findings: Inguinal lymph nodes are identified likely reactive in nature. Large knee joint effusion is noted. IMPRESSION: No evidence of deep venous thrombosis. Large knee joint effusion. Electronically Signed   By: Alcide Clever M.D.   On: 01/26/2016 16:25    EKG:   Orders placed or performed during the hospital encounter of 01/23/16  . ED EKG  . ED EKG  . EKG 12-Lead  . EKG 12-Lead  . EKG 12-Lead  . EKG 12-Lead    ASSESSMENT AND PLAN:   45 year old male with known history of hypertension, diabetes mellitus, end-stage renal disease on hemodialysis, history of C. difficile colitis in December 2016, peripheral vascular disease status post amputation of toes admitted to the hospital secondary to hypotension noted after dialysis.  #1 Hypotension- hypovolemic, Received fluids. Improved -Monitor closely as patient has diastolic CHF and can get fluid overloaded. - on midodrine - for dialysis again Tuesday per schedule  #2 diarrhea-Improved, no further diarrhea now. Negative c.diff. last C. difficile was positive in December.  - Continue probiotics while on antibiotics  #3 End-stage renal disease on hemodialysis-on Tuesday, Thursday and Saturday hemodialysis.  - Nephrology consulted -Dialysis next is Tuesday  per schedule  #4 chronic diastolic congestive heart failure- well compensated -Chest x-ray with findings of pulmonary vascular congestion noted - on chronically on 3l o2.  #5 diabetes mellitus- On lantus now, monitor sugars as has a tendency for hypoglycemia. Continue sliding scale insulin and monitor sugars for now.  #6 UTI- has chronic suprapubic catheter - changed on admission -  urine Cultures negative to  date. On. Omnicef at this time.   #7 GERD- protonix  #8 DVT Prophylaxis- SQ heparin  #9 Left thigh pain- likely muscle spasms- ultrasound is negative for DVT.  #10  lethargic this morning: Likely due to multiple medical problems other than acute infection versus hypoglycemia . Narcotics. Continue to monitor ABG. Watch closely today. No further indication for any other blood work at this time   All the records are reviewed and case discussed with Care Management/Social Workerr. Management plans discussed with the patient, family and they are in agreement.  CODE STATUS: Full Code  TOTAL TIME TAKING CARE OF THIS PATIENT: 25 minutes.   POSSIBLE D/C IN 1 DAYS, DEPENDING ON CLINICAL CONDITION.   Katha Hamming M.D on 01/28/2016 at 12:17 PM  Between 7am to 6pm - Pager - [phone]  After 6pm go to [email or web address] - password [email or web address] [facility]  [facility]  Office  [phone]  CC: Primary care physician; [name], MD

## 2016-01-29 LAB — GLUCOSE, CAPILLARY
GLUCOSE-CAPILLARY: 135 mg/dL — AB (ref 65–99)
GLUCOSE-CAPILLARY: 153 mg/dL — AB (ref 65–99)
GLUCOSE-CAPILLARY: 177 mg/dL — AB (ref 65–99)
GLUCOSE-CAPILLARY: 226 mg/dL — AB (ref 65–99)
Glucose-Capillary: 112 mg/dL — ABNORMAL HIGH (ref 65–99)
Glucose-Capillary: 134 mg/dL — ABNORMAL HIGH (ref 65–99)
Glucose-Capillary: 164 mg/dL — ABNORMAL HIGH (ref 65–99)
Glucose-Capillary: 97 mg/dL (ref 65–99)

## 2016-01-29 LAB — URINALYSIS COMPLETE WITH MICROSCOPIC (ARMC ONLY)
BILIRUBIN URINE: NEGATIVE
Bacteria, UA: NONE SEEN
GLUCOSE, UA: NEGATIVE mg/dL
Ketones, ur: NEGATIVE mg/dL
Nitrite: NEGATIVE
PH: 8 (ref 5.0–8.0)
Protein, ur: 100 mg/dL — AB
Specific Gravity, Urine: 1.016 (ref 1.005–1.030)
Squamous Epithelial / LPF: NONE SEEN

## 2016-01-29 LAB — BASIC METABOLIC PANEL
Anion gap: 11 (ref 5–15)
BUN: 41 mg/dL — AB (ref 6–20)
CHLORIDE: 96 mmol/L — AB (ref 101–111)
CO2: 23 mmol/L (ref 22–32)
CREATININE: 4.22 mg/dL — AB (ref 0.61–1.24)
Calcium: 8.5 mg/dL — ABNORMAL LOW (ref 8.9–10.3)
GFR calc Af Amer: 18 mL/min — ABNORMAL LOW (ref >60)
GFR calc non Af Amer: 15 mL/min — ABNORMAL LOW (ref >60)
Glucose, Bld: 148 mg/dL — ABNORMAL HIGH (ref 65–99)
Potassium: 5 mmol/L (ref 3.5–5.1)
Sodium: 130 mmol/L — ABNORMAL LOW (ref 135–145)

## 2016-01-29 NOTE — Progress Notes (Signed)
Physical Therapy Treatment Patient Details Name: Taylor [name] MRN: [id number] DOB: Feb 23, 1971 Today's Date: 01/29/2016    History of Present Illness Patient is a 45 y.o. male admitted on 23 January 2016 after experiencing hypotension during dialysis. PMH includes HTN, DM, ESRD on hemodialysis, PVD s/p toe amputation, and diastolic CHF.    PT Comments    Difficult treatment session, as pt cognition alternating between present/appropriate and delusional. Pt does not display any active/active assisted motion in Bilateral lower extremities and winces with pain with Right lower extremity passive range of motion. Post limited exercise, pt only yelling "help", but will not respond to questioning of needs/assist. Nursing notified of pt session and cognition. Treatment concluded, as unable to effectively participate further. Continue PT attempting active assisted range of motion/ active range of motion to improve functional mobility as appropriate. Recommendation at this time is skilled nursing facility, but may be more appropriate for long term care.   Follow Up Recommendations  SNF (versus LTC)     Equipment Recommendations  None recommended by PT    Recommendations for Other Services       Precautions / Restrictions Precautions Precautions: Fall Restrictions Weight Bearing Restrictions: No    Mobility  Bed Mobility               General bed mobility comments: Unable to demonstrate any active/active assisted movement  Transfers                 General transfer comment: Not appropriate to attempt  Ambulation/Gait                 Stairs            Wheelchair Mobility    Modified Rankin (Stroke Patients Only)       Balance                                    Cognition Arousal/Alertness: Awake/alert Behavior During Therapy: Agitated (yelling for help) Overall Cognitive Status: No family/caregiver present to determine baseline  cognitive functioning Area of Impairment: Orientation;Attention;Following commands;Awareness Orientation Level: Disoriented to;Place;Time;Situation Current Attention Level: Alternating   Following Commands: Follows one step commands inconsistently       General Comments: Pt initially does not respond to questioning nor make eye contact tending to look straight through therapist. With continued attempt at conversation, pt begins to respond appropriately noting he is hard of hearing and making effort to understand therapists name. Pt answers pain questions. Pt's attention/orientation begins to alternate from appropriate to delusional. Pt acts as if someone else besides therapist in room alternating with focus to task explaining he cannot feel/move legs. Shortly after begins yelling for help, but does not respond with need when asked.    Exercises      General Comments General comments (skin integrity, edema, etc.): Heels floated, as pillows required repositioning      Pertinent Vitals/Pain Pain Assessment: 0-10 Pain Score: 8  Pain Location: "everywhere" Pain Intervention(s): Patient requesting pain meds-RN notified    Home Living                      Prior Function            PT Goals (current goals can now be found in the care plan section) Progress towards PT goals: Not progressing toward goals - comment    Frequency  Min 2X/week  PT Plan Current plan remains appropriate    Co-evaluation             End of Session Equipment Utilized During Treatment: Oxygen Activity Tolerance: Treatment limited secondary to agitation Patient left: in bed;with call bell/phone within reach;with bed alarm set     Time: 1147-1202 PT Time Calculation (min) (ACUTE ONLY): 15 min  Charges:  $Therapeutic Exercise: 8-22 mins                    G Codes:      Kristeen Miss, PTA 01/29/2016, 12:20 PM

## 2016-01-29 NOTE — Progress Notes (Signed)
Pt frequently yelling out. States he is in "terrible pain". Rates it as a 8-10 out of 10. Yesterday pt was given narcan due to lethargy but is very arousblel today. MD called and stated that Rn could give pain meds if needed. Will administer and monitor closely

## 2016-01-29 NOTE — Progress Notes (Signed)
Nutrition Follow-up     INTERVENTION:  Meals and snacks: Cater to pt preferences Medical Nutrition Supplement [place]ar[place]: continue nepro BID for added nutrition   NUTRITION DIAGNOSIS:   Inadequate oral intake related to acute illness, poor appetite as evidenced by per patient/family report.    GOAL:   Patient will meet greater than or equal to 90% of their needs    MONITOR:   PO intake, Supplement acceptance, Labs, Weight trends, I & O's  REASON FOR ASSESSMENT:    (stage II pressure ulcer)    ASSESSMENT:      Pt confused, not appropriate to speak with this pm   Current Nutrition: eating on average 50% of meals per I and O sheet.  Noted nepro not consumed per [facility] on several occassions   Gastrointestinal Profile: Last BM: 3/21   Scheduled Medications:  . acidophilus  1 capsule Oral Daily  . amitriptyline  25 mg Oral QHS  . aspirin EC  81 mg Oral Daily  . calcium-vitamin D  1 tablet Oral Daily  . chlorhexidine  15 mL Mouth Rinse BID  . citalopram  20 mg Oral Daily  . clopidogrel  75 mg Oral Daily  . docusate sodium  100 mg Oral BID  . epoetin (EPOGEN/PROCRIT) injection  10,000 Units Intravenous Q T,Th,Sa-HD  . feeding supplement (NEPRO CARB STEADY)  237 mL Oral BID BM  . fluconazole  100 mg Oral Daily  . heparin subcutaneous  5,000 Units Subcutaneous Q12H  . insulin aspart  0-5 Units Subcutaneous QHS  . insulin aspart  0-9 Units Subcutaneous TID WC  . insulin glargine  10 Units Subcutaneous QHS  . levothyroxine  75 mcg Oral QAC breakfast  . metolazone  5 mg Oral Daily  . nystatin   Topical BID  . omega-3 acid ethyl esters  1 g Oral Daily   And  . vitamin E  100 Units Oral Daily  . pantoprazole  40 mg Oral BID  . pregabalin  75 mg Oral BID  . rosuvastatin  20 mg Oral QHS  . sevelamer carbonate  800 mg Oral TID WC  . sodium chloride flush  3 mL Intravenous Q12H  . traZODone  50 mg Oral QHS        Electrolyte/Renal Profile and Glucose Profile:    Recent Labs Lab 01/23/16 0842 01/23/16 1248 01/24/16 0031 01/25/16 1358 01/28/16 0655  NA 133*  --  130*  --  130*  K 4.2  --  5.1  --  5.0  CL 96*  --  97*  --  96*  CO2 26  --  25  --  23  BUN 32*  --  35*  --  41*  CREATININE 4.03* 4.10* 4.28*  --  4.22*  CALCIUM 8.4*  --  7.7*  --  8.5*  PHOS  --   --   --  4.5  --   GLUCOSE 62*  --  182*  --  148*   Protein Profile:   Recent Labs Lab 01/23/16 0842  ALBUMIN 2.1*     Weight Trend since Admission: Filed Weights   01/27/16 2058 01/28/16 0930 01/28/16 1315  Weight: 189 lb 1.6 oz (85.775 kg) 189 lb 9.5 oz (86 kg) 187 lb 6.3 oz (85 kg)       Diet Order:  Diet renal/carb modified with fluid restriction Diet-HS Snack?: Nothing; Room service appropriate?: Yes; Fluid consistency:: Thin  Skin:   pressure ulcer stage II   Height:  Ht Readings from Last 1 Encounters:  01/23/16 6\' 3"  (1.905 m)    Weight:   Wt Readings from Last 1 Encounters:  01/28/16 187 lb 6.3 oz (85 kg)    Ideal Body Weight:     BMI:  Body mass index is 23.42 kg/(m^2).  Estimated Nutritional Needs:   Kcal:  2100-2520 kcals   Protein:  101-126 g (1.2-1.5 g/kg)   Fluid:  UOP plus UOP  EDUCATION NEEDS:   No education needs identified at this time  LOW Care Level  Eufelia Veno B. 11-06-2006, RD, LDN [phone] (pager) Weekend/On-Call pager [phone])

## 2016-01-29 NOTE — Progress Notes (Signed)
[facility] - [place] at [facility]   PATIENT NAME: Taylor [name]    MR#:  [id number]  DATE OF BIRTH:  1971-04-17  SUBJECTIVE: confused. patient is asking me '' are  You  on my  Side or nurses side?" I told him I am here to help you get better.and we all are here to help you get better.'' Patient is slightly confused. We had to hold the pain medicine yesterday because of lethargy.   CHIEF COMPLAINT:   Chief Complaint  Patient presents with  . Hypotension  . Weakness   - Admitted with hypotension after dialysis. Blood pressure is improved. Lab showing urine infection. On antibiotics. ..  REVIEW OF SYSTEMS:  Review of Systems  Unable to perform ROS: mental acuity  Constitutional: Negative for chills.  HENT: Positive for hearing loss. Negative for ear discharge, ear pain and nosebleeds.   Eyes: Negative for blurred vision and double vision.  Respiratory: Negative for cough, shortness of breath and wheezing.   Cardiovascular: Negative for chest pain, palpitations and leg swelling.       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  Genitourinary: Negative for dysuria and urgency.       Has a suprapubic catheter  Musculoskeletal: Positive for myalgias.       Left thigh cramps and pain  Neurological: Positive for weakness. Negative for dizziness, sensory change, speech change, focal weakness, seizures and headaches.       Bilateral lower extremity weakness  Psychiatric/Behavioral: Negative for depression.    DRUG ALLERGIES:   Allergies  Allergen Reactions  . Amoxicillin-Pot Clavulanate Nausea And Vomiting and Itching  . 2nd Skin Quick Heal Other (See Comments)  . Rifampin Nausea And Vomiting  . Tape Other (See Comments)    VITALS:  Blood pressure 113/62, pulse 93, temperature 97.9 F (36.6 C), temperature source Oral, resp.  rate 17, height 6\' 3"  (1.905 m), weight 85 kg (187 lb 6.3 oz), SpO2 100 %.  PHYSICAL EXAMINATION:  Physical Exam  GENERAL:  45 y.o.-year-old patient lying in the bed with no acute distress.  Chronically ill-l looking male.  EYES: Pupils equal, round, reactive to light and accommodation. No scleral icterus. Extraocular muscles intact.  HEENT: Head atraumatic, normocephalic. Oropharynx and nasopharynx clear.  NECK:  Supple, no jugular venous distention. No thyroid enlargement, no tenderness.  LUNGS: Normal breath sounds bilaterally, no wheezing, rales,rhonchi or crepitation. No use of accessory muscles of respiration. Decreased bibasilar breath sounds CARDIOVASCULAR: S1, S2 normal. No  rubs, or gallops. Soft 3/6 systolic murmur is present ABDOMEN: Abdomen is distended but Soft, nontender. Bowel sounds present. No organomegaly or mass.  EXTREMITIES: No pedal edema, cyanosis, or clubbing. 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 NEUROLOGIC: Unable to do full neurological exam: He is slightly confused.  PSYCHIATRIC; confused.    SKIN' stage II sacral ulcer present.  LABORATORY PANEL:   CBC  Recent Labs Lab 01/28/16 0655  WBC 8.8  HGB 7.9*  HCT 24.6*  PLT 125*   ------------------------------------------------------------------------------------------------------------------  Chemistries   Recent Labs Lab 01/23/16 0842  01/28/16 0655  NA 133*  < > 130*  K 4.2  < > 5.0  CL 96*  < > 96*  CO2 26  < > 23  GLUCOSE 62*  < > 148*  BUN 32*  < > 41*  CREATININE 4.03*  < > 4.22*  CALCIUM  8.4*  < > 8.5*  AST 26  --   --   ALT 14*  --   --   ALKPHOS 515*  --   --   BILITOT 1.2  --   --   < > = values in this interval not displayed. ------------------------------------------------------------------------------------------------------------------  Cardiac Enzymes  Recent Labs Lab 01/24/16 0031  TROPONINI 0.09*    ------------------------------------------------------------------------------------------------------------------  RADIOLOGY:  No results found.  EKG:   Orders placed or performed during the hospital encounter of 01/23/16  . ED EKG  . ED EKG  . EKG 12-Lead  . EKG 12-Lead  . EKG 12-Lead  . EKG 12-Lead    ASSESSMENT AND PLAN:   45 year old male with known history of hypertension, diabetes mellitus, end-stage renal disease on hemodialysis, history of C. difficile colitis in December 2016, peripheral vascular disease status post amputation of toes admitted to the hospital secondary to hypotension noted after dialysis.  #1 Hypotension- hypovolemic, Received fluids. Improved -  #2 diarrhea-Improved, no further diarrhea now. Negative c.diff. last C. difficile was positive in December.  - Continue probiotics while on antibiotics  #3 End-stage renal disease on hemodialysis-on Tuesday, Thursday and Saturday hemodialysis.  - Nephrology consulted   #4 chronic diastolic congestive heart failure- well compensated -Chest x-ray with findings of pulmonary vascular congestion noted - on chronically on 3l o2.  #5 diabetes mellitus- On lantus now, monitor sugars as has a tendency for hypoglycemia. Continue sliding scale insulin and low-dose Lantus.  #6 UTI- has chronic suprapubic catheter - changed on admission -  urine Cultures showed Candida. Patient is on Diflucan at this time. #7 GERD- protonix  #8 DVT Prophylaxis- SQ heparin  #9 Left thigh pain- likely muscle spasms- ultrasound is negative for DVT.  #10 /confusion, likely related to underlying depression and psychiatric problem: Restart the Valium. And the Celexa. Psychiatric consult. #11 .stage II sacral ulcer, stage I left lateral heel ulcer: Wound care consult.  All the records are reviewed and case discussed with Care Management/Social Workerr. Management plans discussed with the patient, family and they are in  agreement.  CODE STATUS: DO NOT RESUSCITATE  TOTAL TIME TAKING CARE OF THIS PATIENT: 25 minutes.   POSSIBLE D/C IN 1 DAYS, DEPENDING ON CLINICAL CONDITION.   Katha Hamming M.D on 01/29/2016 at 11:50 AM  Between 7am to 6pm - Pager - [phone]  After 6pm go to [email or web address] - password [email or web address] [facility]  [facility]  Office  [phone]  CC: Primary care physician; [name], MD

## 2016-01-29 NOTE — Progress Notes (Signed)
[facility]  ROUNDING NOTE   Subjective:  The patient was quite confused this a.m. Blood sugar was checked and was normal. He had hemodialysis yesterday.   Objective:  Vital signs in last 24 hours:  Temp:  [97.9 F (36.6 C)-98.7 F (37.1 C)] 97.9 F (36.6 C) (03/22 0946) Pulse Rate:  [93-141] 93 (03/22 1148) Resp:  [17-20] 17 (03/22 0946) BP: (106-115)/(46-62) 113/62 mmHg (03/22 0946) SpO2:  [74 %-100 %] 100 % (03/22 1148)  Weight change: 0.225 kg (7.9 oz) Filed Weights   01/27/16 2058 01/28/16 0930 01/28/16 1315  Weight: 85.775 kg (189 lb 1.6 oz) 86 kg (189 lb 9.5 oz) 85 kg (187 lb 6.3 oz)    Intake/Output: I/O last 3 completed shifts: In: 118 [P.O.:118] Out: 2085 [Urine:85; Other:2000]   Intake/Output this shift:  Total I/O In: 0  Out: 25 [Urine:25]  Physical Exam: General: Chronically ill appearing  Head: Normocephalic, atraumatic. Moist oral mucosal membranes  Eyes: Anicteric  Neck: Supple, trachea midline  Lungs:  Clear to auscultation normal effort  Heart: S1S2 no rubs  Abdomen:  Soft, nontender, distended, BS present   Extremities: 1+ peripheral edema.  Neurologic: Confused, thought he was at home, following commands  Skin: No lesions  Access: LUE AVF    Basic Metabolic Panel:  Recent Labs Lab 01/23/16 0842 01/23/16 1248 01/24/16 0031 01/25/16 1358 01/28/16 0655  NA 133*  --  130*  --  130*  K 4.2  --  5.1  --  5.0  CL 96*  --  97*  --  96*  CO2 26  --  25  --  23  GLUCOSE 62*  --  182*  --  148*  BUN 32*  --  35*  --  41*  CREATININE 4.03* 4.10* 4.28*  --  4.22*  CALCIUM 8.4*  --  7.7*  --  8.5*  PHOS  --   --   --  4.5  --     Liver Function Tests:  Recent Labs Lab 01/23/16 0842  AST 26  ALT 14*  ALKPHOS 515*  BILITOT 1.2  PROT 7.0  ALBUMIN 2.1*   No results for input(s): LIPASE, AMYLASE in the last 168 hours. No results for input(s): AMMONIA in the last 168 hours.  CBC:  Recent Labs Lab 01/23/16 0842  01/23/16 1248 01/28/16 0655  WBC 7.0 8.0 8.8  NEUTROABS 5.5  --   --   HGB 8.9* 8.3* 7.9*  HCT 28.1* 26.5* 24.6*  MCV 89.7 90.4 89.0  PLT 106* 99* 125*    Cardiac Enzymes:  Recent Labs Lab 01/23/16 0842 01/23/16 1248 01/23/16 1818 01/24/16 0031  TROPONINI 0.12* 0.11* 0.15* 0.09*    BNP: Invalid input(s): POCBNP  CBG:  Recent Labs Lab 01/29/16 0024 01/29/16 0428 01/29/16 0725 01/29/16 0945 01/29/16 1118  GLUCAP 97 112* 134* 135* 153*    Microbiology: Results for orders placed or performed during the hospital encounter of 01/23/16  Blood culture (routine x 2)     Status: None   Collection Time: 01/23/16  8:42 AM  Result Value Ref Range Status   Specimen Description BLOOD RIGHT AC  Final   Special Requests   Final    BOTTLES DRAWN AEROBIC AND ANAEROBIC AER ANA   Culture NO GROWTH 5 DAYS  Final   Report Status 01/28/2016 FINAL  Final  Blood culture (routine x 2)     Status: None   Collection Time: 01/23/16  8:42 AM  Result  Value Ref Range Status   Specimen Description BLOOD RIGHT HAND  Final   Special Requests   Final    BOTTLES DRAWN AEROBIC AND ANAEROBIC AER ANA   Culture NO GROWTH 5 DAYS  Final   Report Status 01/28/2016 FINAL  Final  Urine culture     Status: None   Collection Time: 01/23/16  8:42 AM  Result Value Ref Range Status   Specimen Description URINE, CATHETERIZED  Final   Special Requests Normal  Final   Culture >=100,000 COLONIES/mL CANDIDA ALBICANS  Final   Report Status 01/28/2016 FINAL  Final  C difficile quick scan w PCR reflex     Status: None   Collection Time: 01/23/16  5:51 PM  Result Value Ref Range Status   C Diff antigen NEGATIVE NEGATIVE Final   C Diff toxin NEGATIVE NEGATIVE Final   C Diff interpretation Negative for C. difficile  Final    Coagulation Studies: No results for input(s): LABPROT, INR in the last 72 hours.  Urinalysis: No results for input(s): COLORURINE, LABSPEC, PHURINE, GLUCOSEU, HGBUR,  BILIRUBINUR, KETONESUR, PROTEINUR, UROBILINOGEN, NITRITE, LEUKOCYTESUR in the last 72 hours.  Invalid input(s): APPERANCEUR    Imaging: No results found.   Medications:     . acidophilus  1 capsule Oral Daily  . amitriptyline  25 mg Oral QHS  . aspirin EC  81 mg Oral Daily  . calcium-vitamin D  1 tablet Oral Daily  . chlorhexidine  15 mL Mouth Rinse BID  . citalopram  20 mg Oral Daily  . clopidogrel  75 mg Oral Daily  . docusate sodium  100 mg Oral BID  . epoetin (EPOGEN/PROCRIT) injection  10,000 Units Intravenous Q T,Th,Sa-HD  . feeding supplement (NEPRO CARB STEADY)  237 mL Oral BID BM  . fluconazole  100 mg Oral Daily  . heparin subcutaneous  5,000 Units Subcutaneous Q12H  . insulin aspart  0-5 Units Subcutaneous QHS  . insulin aspart  0-9 Units Subcutaneous TID WC  . insulin glargine  10 Units Subcutaneous QHS  . levothyroxine  75 mcg Oral QAC breakfast  . metolazone  5 mg Oral Daily  . nystatin   Topical BID  . omega-3 acid ethyl esters  1 g Oral Daily   And  . vitamin E  100 Units Oral Daily  . pantoprazole  40 mg Oral BID  . pregabalin  75 mg Oral BID  . rosuvastatin  20 mg Oral QHS  . sevelamer carbonate  800 mg Oral TID WC  . sodium chloride flush  3 mL Intravenous Q12H  . traZODone  50 mg Oral QHS   acetaminophen **OR** acetaminophen, albuterol, alum & mag hydroxide-simeth, diazepam, HYDROcodone-acetaminophen, midodrine, morphine injection, naLOXone (NARCAN)  injection, nitroGLYCERIN, ondansetron **OR** ondansetron (ZOFRAN) IV, senna-docusate  Assessment/ Plan:  45 y.o. 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   [facility] [address] TTS  1. ESRD on HD TTHS: patient had hemodialysis yesterday.  No acute indication for dialysis today.  We will plan for hemodialysis again tomorrow.  2. Anemia of CKD: hemoglobin at last check was 7.9.  Continue  Epogen 10,000 units with dialysis.   3. Secondary Hyperparathyroidism:  Continue Renvela for phosphorous control.  PTH low at 60, therefore sensipar stopped.  4.  Hypotension: Blood pressure 113/62 earlier this a.m.[name] Continue midodrine.  5.  Altered mental status:  Noted this a.m.  Patient quite confused. Would avoid pain meds at the moment.   LOS: 6 Linnae Rasool 3/22/20172:01 PM

## 2016-01-29 NOTE — Plan of Care (Signed)
Problem: Education: Goal: Knowledge of [facility] Education information/materials will improve Outcome: Not Progressing Patient is confused at times.  Problem: Health Behavior/Discharge Planning: Goal: Ability to manage health-related needs will improve Outcome: Not Progressing Patient needs assistance.  Problem: Skin Integrity: Goal: Risk for impaired skin integrity will decrease Outcome: Not Progressing Patient has pressure ulcers and is being repositioned.  Pink foam was placed for protection.

## 2016-01-30 ENCOUNTER — Inpatient Hospital Stay: Payer: Medicare Other

## 2016-01-30 LAB — BLOOD GAS, ARTERIAL
ACID-BASE EXCESS: 8 mmol/L — AB (ref 0.0–3.0)
Allens test (pass/fail): POSITIVE — AB
BICARBONATE: 33.2 meq/L — AB (ref 21.0–28.0)
FIO2: 21
O2 Saturation: 94.3 %
PCO2 ART: 50 mmHg — AB (ref 32.0–48.0)
PH ART: 7.43 (ref 7.350–7.450)
PO2 ART: 70 mmHg — AB (ref 83.0–108.0)
Patient temperature: 37

## 2016-01-30 LAB — CREATININE, SERUM
Creatinine, Ser: 3.83 mg/dL — ABNORMAL HIGH (ref 0.61–1.24)
GFR, EST AFRICAN AMERICAN: 20 mL/min — AB (ref >60)
GFR, EST NON AFRICAN AMERICAN: 17 mL/min — AB (ref >60)

## 2016-01-30 LAB — GLUCOSE, CAPILLARY
GLUCOSE-CAPILLARY: 91 mg/dL (ref 65–99)
Glucose-Capillary: 107 mg/dL — ABNORMAL HIGH (ref 65–99)
Glucose-Capillary: 144 mg/dL — ABNORMAL HIGH (ref 65–99)
Glucose-Capillary: 159 mg/dL — ABNORMAL HIGH (ref 65–99)
Glucose-Capillary: 178 mg/dL — ABNORMAL HIGH (ref 65–99)

## 2016-01-30 NOTE — Progress Notes (Signed)
Post hd tx 

## 2016-01-30 NOTE — Progress Notes (Signed)
[facility]  ROUNDING NOTE   Subjective:  Patient sleeping comfortably this a.m.  He is arousable however. He is due for dialysis today.   Objective:  Vital signs in last 24 hours:  Temp:  [97.9 F (36.6 C)-98.1 F (36.7 C)] 98.1 F (36.7 C) (03/22 2033) Pulse Rate:  [86-93] 86 (03/22 2033) Resp:  [16-17] 16 (03/22 2033) BP: (112-113)/(56-62) 112/56 mmHg (03/22 2033) SpO2:  [100 %] 100 % (03/22 2033)  Weight change:  Filed Weights   01/27/16 2058 01/28/16 0930 01/28/16 1315  Weight: 85.775 kg (189 lb 1.6 oz) 86 kg (189 lb 9.5 oz) 85 kg (187 lb 6.3 oz)    Intake/Output: I/O last 3 completed shifts: In: 360 [P.O.:360] Out: 35 [Urine:35]   Intake/Output this shift:     Physical Exam: General: Chronically ill appearing  Head: Normocephalic, atraumatic. Moist oral mucosal membranes  Eyes: Anicteric  Neck: Supple, trachea midline  Lungs:  Clear to auscultation normal effort  Heart: S1S2 no rubs  Abdomen:  Soft, nontender, distended, BS present   Extremities: 1+ peripheral edema.  Neurologic:  sleeping but arousable   Skin: No lesions  Access: LUE AVF    Basic Metabolic Panel:  Recent Labs Lab 01/23/16 0842 01/23/16 1248 01/24/16 0031 01/25/16 1358 01/28/16 0655 01/30/16 0546  NA 133*  --  130*  --  130*  --   K 4.2  --  5.1  --  5.0  --   CL 96*  --  97*  --  96*  --   CO2 26  --  25  --  23  --   GLUCOSE 62*  --  182*  --  148*  --   BUN 32*  --  35*  --  41*  --   CREATININE 4.03* 4.10* 4.28*  --  4.22* 3.83*  CALCIUM 8.4*  --  7.7*  --  8.5*  --   PHOS  --   --   --  4.5  --   --     Liver Function Tests:  Recent Labs Lab 01/23/16 0842  AST 26  ALT 14*  ALKPHOS 515*  BILITOT 1.2  PROT 7.0  ALBUMIN 2.1*   No results for input(s): LIPASE, AMYLASE in the last 168 hours. No results for input(s): AMMONIA in the last 168 hours.  CBC:  Recent Labs Lab 01/23/16 0842 01/23/16 1248 01/28/16 0655  WBC 7.0 8.0 8.8  NEUTROABS  5.5  --   --   HGB 8.9* 8.3* 7.9*  HCT 28.1* 26.5* 24.6*  MCV 89.7 90.4 89.0  PLT 106* 99* 125*    Cardiac Enzymes:  Recent Labs Lab 01/23/16 0842 01/23/16 1248 01/23/16 1818 01/24/16 0031  TROPONINI 0.12* 0.11* 0.15* 0.09*    BNP: Invalid input(s): POCBNP  CBG:  Recent Labs Lab 01/29/16 1447 01/29/16 1626 01/29/16 2034 01/30/16 0047 01/30/16 0454  GLUCAP 177* 164* 226* 178* 159*    Microbiology: Results for orders placed or performed during the hospital encounter of 01/23/16  Blood culture (routine x 2)     Status: None   Collection Time: 01/23/16  8:42 AM  Result Value Ref Range Status   Specimen Description BLOOD RIGHT AC  Final   Special Requests   Final    BOTTLES DRAWN AEROBIC AND ANAEROBIC AER ANA   Culture NO GROWTH 5 DAYS  Final   Report Status 01/28/2016 FINAL  Final  Blood culture (routine x 2)     Status:  None   Collection Time: 01/23/16  8:42 AM  Result Value Ref Range Status   Specimen Description BLOOD RIGHT HAND  Final   Special Requests   Final    BOTTLES DRAWN AEROBIC AND ANAEROBIC AER ANA   Culture NO GROWTH 5 DAYS  Final   Report Status 01/28/2016 FINAL  Final  Urine culture     Status: None   Collection Time: 01/23/16  8:42 AM  Result Value Ref Range Status   Specimen Description URINE, CATHETERIZED  Final   Special Requests Normal  Final   Culture >=100,000 COLONIES/mL CANDIDA ALBICANS  Final   Report Status 01/28/2016 FINAL  Final  C difficile quick scan w PCR reflex     Status: None   Collection Time: 01/23/16  5:51 PM  Result Value Ref Range Status   C Diff antigen NEGATIVE NEGATIVE Final   C Diff toxin NEGATIVE NEGATIVE Final   C Diff interpretation Negative for C. difficile  Final    Coagulation Studies: No results for input(s): LABPROT, INR in the last 72 hours.  Urinalysis:  Recent Labs  01/29/16 1505  COLORURINE AMBER*  LABSPEC 1.016  PHURINE 8.0  GLUCOSEU NEGATIVE  HGBUR 1+*  BILIRUBINUR  NEGATIVE  KETONESUR NEGATIVE  PROTEINUR 100*  NITRITE NEGATIVE  LEUKOCYTESUR 3+*      Imaging: No results found.   Medications:     . acidophilus  1 capsule Oral Daily  . amitriptyline  25 mg Oral QHS  . aspirin EC  81 mg Oral Daily  . calcium-vitamin D  1 tablet Oral Daily  . chlorhexidine  15 mL Mouth Rinse BID  . citalopram  20 mg Oral Daily  . clopidogrel  75 mg Oral Daily  . docusate sodium  100 mg Oral BID  . epoetin (EPOGEN/PROCRIT) injection  10,000 Units Intravenous Q T,Th,Sa-HD  . feeding supplement (NEPRO CARB STEADY)  237 mL Oral BID BM  . fluconazole  100 mg Oral Daily  . heparin subcutaneous  5,000 Units Subcutaneous Q12H  . insulin aspart  0-5 Units Subcutaneous QHS  . insulin aspart  0-9 Units Subcutaneous TID WC  . insulin glargine  10 Units Subcutaneous QHS  . levothyroxine  75 mcg Oral QAC breakfast  . metolazone  5 mg Oral Daily  . nystatin   Topical BID  . omega-3 acid ethyl esters  1 g Oral Daily   And  . vitamin E  100 Units Oral Daily  . pantoprazole  40 mg Oral BID  . pregabalin  75 mg Oral BID  . rosuvastatin  20 mg Oral QHS  . sevelamer carbonate  800 mg Oral TID WC  . sodium chloride flush  3 mL Intravenous Q12H  . traZODone  50 mg Oral QHS   acetaminophen **OR** acetaminophen, albuterol, alum & mag hydroxide-simeth, diazepam, HYDROcodone-acetaminophen, midodrine, morphine injection, naLOXone (NARCAN)  injection, nitroGLYCERIN, ondansetron **OR** ondansetron (ZOFRAN) IV, senna-docusate  Assessment/ Plan:  45 y.o. 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   [facility] [address] TTS  1. ESRD on HD TTHS: We will plan for hemodialysis today. Ultrafiltration  target 2.5 kg.  2. Anemia of CKD: Hemoglobin remains low at 7.9. Continue Epogen 10,000 units IV with dialysis.  3. Secondary Hyperparathyroidism:  Continue Renvela for phosphorous control.  PTH low at 60, therefore sensipar stopped.  4.  Hypotension: Blood pressure 112/56 at last check. Continue midodrine.  5.  Altered mental status:  Lethargic this a.m. but is arousable. Continue to monitor mental status.   LOS: 7 Montine Hight 3/23/20177:57 AM

## 2016-01-30 NOTE — Progress Notes (Signed)
Pt off unit to dialysis, afterwards CT called for report on pt, notified CT that pt is currently off unit. CT/dialysis to correspond accordingly.

## 2016-01-30 NOTE — Progress Notes (Signed)
Pt still sleepy, but able to be aroused this morning and have minimal conversation. Pt reaches up in air (as if grabbing objects) at times, then drifts back off to sleep. noticed pt pupils are fixed, notified Dr. Luberta Mutter. Pt pupils sluggishly responds to light, also seen with Dr. Sherryll Burger. Interdisciplinary team is aware, new orders placed. Continue to assess.

## 2016-01-30 NOTE — Progress Notes (Signed)
Hemodialysis completed. 

## 2016-01-30 NOTE — Progress Notes (Signed)
[facility] - [place] at [facility]   PATIENT NAME: Taylor [name]    MR#:  [id number]  DATE OF BIRTH:  30-Aug-1971  SUBJECTIVE: lethargic.. ABG is pending. CT head ordered.   CHIEF COMPLAINT:   Chief Complaint  Patient presents with  . Hypotension  . Weakness   - Admitted with hypotension after dialysis. Blood pressure is improved. Lab showing urine infection. On antibiotics. ..  REVIEW OF SYSTEMS:  Review of Systems  Unable to perform ROS: mental acuity  Constitutional: Negative for chills.  HENT: Positive for hearing loss. Negative for ear discharge, ear pain and nosebleeds.   Eyes: Negative for blurred vision and double vision.  Respiratory: Negative for cough, shortness of breath and wheezing.   Cardiovascular: Negative for chest pain, palpitations and leg swelling.       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  Genitourinary: Negative for dysuria and urgency.       Has a suprapubic catheter  Musculoskeletal: Positive for myalgias.       Left thigh cramps and pain  Neurological: Positive for weakness. Negative for dizziness, sensory change, speech change, focal weakness, seizures and headaches.       Bilateral lower extremity weakness  Psychiatric/Behavioral: Negative for depression.    DRUG ALLERGIES:   Allergies  Allergen Reactions  . Amoxicillin-Pot Clavulanate Nausea And Vomiting and Itching  . 2nd Skin Quick Heal Other (See Comments)  . Rifampin Nausea And Vomiting  . Tape Other (See Comments)    VITALS:  Blood pressure 121/75, pulse 74, temperature 98.2 F (36.8 C), temperature source Oral, resp. rate 14, height 6\' 3"  (1.905 m), weight 85.5 kg (188 lb 7.9 oz), SpO2 100 %.  PHYSICAL EXAMINATION:  Physical Exam  GENERAL:  45 y.o.-year-old patient lying in the bed with no acute distress.  Chronically ill-l  looking male.  EYES: Pupils fixator. Received morphine last night. and accommodation. No scleral icterus. Extraocular muscles intact.  HEENT: Head atraumatic, normocephalic. Oropharynx and nasopharynx clear.  NECK:  Supple, no jugular venous distention. No thyroid enlargement, no tenderness.  LUNGS: Normal breath sounds bilaterally, no wheezing, rales,rhonchi or crepitation. No use of accessory muscles of respiration. Decreased bibasilar breath sounds CARDIOVASCULAR: S1, S2 normal. No  rubs, or gallops. Soft 3/6 systolic murmur is present ABDOMEN: Abdomen is distended but Soft, nontender. Bowel sounds present. No organomegaly or mass.  EXTREMITIES: No pedal edema, cyanosis, or clubbing. 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 NEUROLOGIC: Unable to do full neurological exam: He is lethargic. 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  LABORATORY PANEL:   CBC  Recent Labs Lab 01/28/16 0655  WBC 8.8  HGB 7.9*  HCT 24.6*  PLT 125*   ------------------------------------------------------------------------------------------------------------------  Chemistries   Recent Labs Lab 01/28/16 0655 01/30/16 0546  NA 130*  --   K 5.0  --   CL 96*  --   CO2 23  --   GLUCOSE 148*  --   BUN 41*  --   CREATININE 4.22* 3.83*  CALCIUM 8.5*  --    ------------------------------------------------------------------------------------------------------------------  Cardiac Enzymes  Recent Labs Lab 01/24/16 0031  TROPONINI 0.09*   ------------------------------------------------------------------------------------------------------------------  RADIOLOGY:  No results found.  EKG:   Orders placed or performed during the hospital encounter of 01/23/16  . ED EKG  . ED EKG  . EKG 12-Lead  . EKG  12-Lead  . EKG 12-Lead  . EKG 12-Lead    ASSESSMENT AND PLAN:   45 year old male with known history of hypertension,  diabetes mellitus, end-stage renal disease on hemodialysis, history of C. difficile colitis in December 2016, peripheral vascular disease status post amputation of toes admitted to the hospital secondary to hypotension noted after dialysis.  #1 Hypotension- hypovolemic, Received fluids. Improved -  #2 diarrhea-Improved, no further diarrhea now. Negative c.diff. last C. difficile was positive in December.  - Continue probiotics while on antibiotics  #3 End-stage renal disease on hemodialysis-on Tuesday, Thursday and Saturday hemodialysis.     #4 chronic diastolic congestive heart failure- well compensated - #5 diabetes mellitus- to poor by mouth intake and lethargy hold the Lantus  . #6 UTI- has chronic suprapubic catheter - changed on admission -  urine Cultures showed Candida. Patient is on Diflucan at this time. #7 GERD- protonix  #8 DVT Prophylaxis- SQ heparin  #9 Left thigh pain- likely muscle spasms- ultrasound is negative for DVT.  #10. Lethargy likely secondary to metabolic encephalopathy' underlying metabolic causes: Ordered ABG, head CT. Continue to treat infection, rpt chest x-ray, blood cultures. Avoid  Narcotics.  #11 .stage II sacral ulcer, stage I left lateral heel ulcer: Wound care consult.  All the records are reviewed and case discussed with Care Management/Social Workerr. Management plans discussed with the patient, family and they are in agreement.  CODE STATUS: DO NOT RESUSCITATE  TOTAL TIME TAKING CARE OF THIS PATIENT: 25 minutes.   POSSIBLE D/C IN 1 DAYS, DEPENDING ON CLINICAL CONDITION.   Katha Hamming M.D on 01/30/2016 at 12:39 PM  Between 7am to 6pm - Pager - [phone]  After 6pm go to [email or web address] - password [email or web address] [facility]  [facility]  Office  [phone]  CC: Primary care physician; [name], MD

## 2016-01-30 NOTE — Progress Notes (Signed)
Pre-hd tx 

## 2016-01-30 NOTE — Progress Notes (Signed)
PT Cancellation Note  Patient Details Name: Taylor [name] MRN: [id number] DOB: 1971/10/02   Cancelled Treatment:    Reason Eval/Treat Not Completed: Patient at procedure or test/unavailable. Pt currently at hemodialysis. Re attempt treatment at a later time/date as the schedule/pt availability allows.    Kristeen Miss, [place] 01/30/2016, 12:09 PM

## 2016-01-31 ENCOUNTER — Ambulatory Visit: Payer: Medicare Other | Admitting: *Deleted

## 2016-01-31 ENCOUNTER — Inpatient Hospital Stay: Payer: Medicare Other

## 2016-01-31 DIAGNOSIS — R401 Stupor: Secondary | ICD-10-CM

## 2016-01-31 DIAGNOSIS — R4182 Altered mental status, unspecified: Secondary | ICD-10-CM | POA: Insufficient documentation

## 2016-01-31 LAB — CBC
HCT: 23.9 % — ABNORMAL LOW (ref 40.0–52.0)
Hemoglobin: 7.7 g/dL — ABNORMAL LOW (ref 13.0–18.0)
MCH: 29.1 pg (ref 26.0–34.0)
MCHC: 32.1 g/dL (ref 32.0–36.0)
MCV: 90.8 fL (ref 80.0–100.0)
PLATELETS: 145 10*3/uL — AB (ref 150–440)
RBC: 2.63 MIL/uL — AB (ref 4.40–5.90)
RDW: 17.4 % — AB (ref 11.5–14.5)
WBC: 3.4 10*3/uL — AB (ref 3.8–10.6)

## 2016-01-31 LAB — GLUCOSE, CAPILLARY
GLUCOSE-CAPILLARY: 129 mg/dL — AB (ref 65–99)
GLUCOSE-CAPILLARY: 126 mg/dL — AB (ref 65–99)
GLUCOSE-CAPILLARY: 218 mg/dL — AB (ref 65–99)
Glucose-Capillary: 83 mg/dL (ref 65–99)
Glucose-Capillary: 117 mg/dL — ABNORMAL HIGH (ref 65–99)
Glucose-Capillary: 173 mg/dL — ABNORMAL HIGH (ref 65–99)

## 2016-01-31 LAB — HEMOGLOBIN: HEMOGLOBIN: 8.3 g/dL — AB (ref 13.0–18.0)

## 2016-01-31 LAB — AMMONIA: Ammonia: 24 umol/L (ref 9–35)

## 2016-01-31 LAB — HEMATOCRIT: HEMATOCRIT: 26.4 % — AB (ref 40.0–52.0)

## 2016-01-31 LAB — PREPARE RBC (CROSSMATCH)

## 2016-01-31 MED ORDER — TEMAZEPAM 7.5 MG PO CAPS
7.5000 mg | ORAL_CAPSULE | Freq: Every evening | ORAL | Status: DC | PRN
  Administered 2016-01-31: 7.5 mg via ORAL
  Filled 2016-01-31: qty 1

## 2016-01-31 MED ORDER — INSULIN GLARGINE 100 UNIT/ML ~~LOC~~ SOLN
6.0000 [IU] | Freq: Every day | SUBCUTANEOUS | Status: DC
  Administered 2016-01-31 – 2016-02-02 (×3): 6 [IU] via SUBCUTANEOUS
  Filled 2016-01-31 (×4): qty 0.06

## 2016-01-31 MED ORDER — SODIUM CHLORIDE 0.9 % IV SOLN
Freq: Once | INTRAVENOUS | Status: AC
  Administered 2016-01-31: 12:00:00 via INTRAVENOUS
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
## 2016-01-31 NOTE — Care Management Important Message (Signed)
Important Message  Patient Details  Name: Taylor [name] MRN: [id number] Date of Birth: Apr 11, 1971   Medicare Important Message Given:  Yes    Olegario Messier A Michial Disney 01/31/2016, 12:58 PM

## 2016-01-31 NOTE — Clinical Social Work Note (Signed)
Patient not yet ready for discharge, [facility] has been updated. York Spaniel MSW,LCSW [phone]

## 2016-01-31 NOTE — Progress Notes (Signed)
Inpatient Diabetes Program Recommendations  AACE/ADA: New Consensus Statement on Inpatient Glycemic Control (2015)  Target Ranges:  Prepandial:   less than 140 mg/dL      [facility] postprandial:   less than 180 mg/dL (1-2 hours)      Critically ill patients:  140 - 180 mg/dL   Review of Glycemic Control   Results for ISAMI, MEHRA (MRN [id number]) as of 01/31/2016 08:28  Ref. Range 01/30/2016 17:25 01/30/2016 21:07 01/31/2016 01:07 01/31/2016 04:43 01/31/2016 07:29  Glucose-Capillary Latest Ref Range: 65-99 mg/dL 91 825 (H) 053 (H) 976 (H) 83   Diabetes history: Notes indicate he has Type 1 diabetes Outpatient Diabetes medications: Levemir 12 units qhs, Novolog 0-6 units tid with meals Current orders for Inpatient glycemic control: Levemir 10 units qhs, Novolog 0-5 units qhs, Novolog 0-9 units tid   Inpatient Diabetes Program Recommendations:    Chronic low blood sugar. Patient has Type 1 diabetes and is very sensitive to insulin.      Please change Novolog to a custom correction scale to reflect home needs;  Blood glucose 201-250mg /dl=2 units   251-300mg /dl= 3 units   301-350mg /dl=4 units   351-400mg /dl= 5 units   401-450mg /dl= 6 units  Please decrease Lantus to 6 units qhs.  [id number] RN, BA, MHA, CDE Diabetes Coordinator Inpatient Diabetes Program  [phone] (Team Pager) [phone] [facility] Office) 01/31/2016 8:59 AM

## 2016-01-31 NOTE — Progress Notes (Signed)
PT Cancellation Note  Patient Details Name: Taylor [name] MRN: [id number] DOB: 1971-03-31   Cancelled Treatment:    Reason Eval/Treat Not Completed: Fatigue/lethargy limiting ability to participate (Chart reviewed for attempted treatment sesssion.  Per primary RN, patient with increased lethargy this date, unable to participate with PT at this time.  Recommends hold at this time with re-attempt at later time/date.  Will continue efforts as appropriate)   [place]house H. Manson Passey, PT, DPT, NCS 01/31/2016, 2:28 PM [phone]

## 2016-01-31 NOTE — Progress Notes (Addendum)
[facility] - [place] at [facility]   PATIENT NAME: Taylor [name]    MR#:  [id number]  DATE OF BIRTH:  1971-02-20  SUBJECTIVE: still lethargic.but follows comands.  CHIEF COMPLAINT:   Chief Complaint  Patient presents with  . Hypotension  . Weakness   - Admitted with hypotension after dialysis. Blood pressure is improved. Lab showing urine infection. On antibiotics. ..  REVIEW OF SYSTEMS:  Review of Systems  Unable to perform ROS: mental acuity  Constitutional: Negative for chills.  HENT: Positive for hearing loss. Negative for ear discharge, ear pain and nosebleeds.   Eyes: Negative for blurred vision and double vision.  Respiratory: Negative for cough, shortness of breath and wheezing.   Cardiovascular: Negative for chest pain, palpitations and leg swelling.       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  Genitourinary: Negative for dysuria and urgency.       Has a suprapubic catheter  Musculoskeletal: Positive for myalgias.       Left thigh cramps and pain  Neurological: Positive for weakness. Negative for dizziness, sensory change, speech change, focal weakness, seizures and headaches.       Bilateral lower extremity weakness  Psychiatric/Behavioral: Negative for depression.    DRUG ALLERGIES:   Allergies  Allergen Reactions  . Amoxicillin-Pot Clavulanate Nausea And Vomiting and Itching  . 2nd Skin Quick Heal Other (See Comments)  . Rifampin Nausea And Vomiting  . Tape Other (See Comments)    VITALS:  Blood pressure 110/62, pulse 88, temperature 98.6 F (37 C), temperature source Oral, resp. rate 16, height 6\' 3"  (1.905 m), weight 80.922 kg (178 lb 6.4 oz), SpO2 100 %.  PHYSICAL EXAMINATION:  Physical Exam  GENERAL:  45 y.o.-year-old patient lying in the bed with no acute distress.  Chronically ill-l looking  male. .lethargic. EYES: Pupils fixator. Received morphine last night. and accommodation. No scleral icterus. Extraocular muscles intact.  HEENT: Head atraumatic, normocephalic. Oropharynx and nasopharynx clear.  NECK:  Supple, no jugular venous distention. No thyroid enlargement, no tenderness.  LUNGS: Normal breath sounds bilaterally, no wheezing, rales,rhonchi or crepitation. No use of accessory muscles of respiration. Decreased bibasilar breath sounds CARDIOVASCULAR: S1, S2 normal. No  rubs, or gallops. Soft 3/6 systolic murmur is present ABDOMEN: Abdomen is distended but Soft, nontender. Bowel sounds present. No organomegaly or mass.  EXTREMITIES: No pedal edema, cyanosis, or clubbing. 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 NEUROLOGIC: Unable to do full neurological exam: He is lethargic. 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  LABORATORY PANEL:   CBC  Recent Labs Lab 01/31/16 0501  WBC 3.4*  HGB 7.7*  HCT 23.9*  PLT 145*   ------------------------------------------------------------------------------------------------------------------  Chemistries   Recent Labs Lab 01/28/16 0655 01/30/16 0546  NA 130*  --   K 5.0  --   CL 96*  --   CO2 23  --   GLUCOSE 148*  --   BUN 41*  --   CREATININE 4.22* 3.83*  CALCIUM 8.5*  --    ------------------------------------------------------------------------------------------------------------------  Cardiac Enzymes No results for input(s): TROPONINI in the last 168 hours. ------------------------------------------------------------------------------------------------------------------  RADIOLOGY:  Dg Chest 1 View  01/30/2016  CLINICAL DATA:  Weakness, lethargy.  Dialysis patient. EXAM: CHEST 1 VIEW COMPARISON:  01/23/2016, 01/08/2016 and 01/07/2016 FINDINGS: Sternotomy wires are unchanged. Lungs are adequately inflated and demonstrate persistent  hazy bibasilar  opacification and mild hazy perihilar prominence suggesting mild vascular congestion with possible small amount left pleural fluid. Linear right midlung atelectasis. Stable cardiomegaly. Remainder of the exam is unchanged. IMPRESSION: Findings likely representing persistent mild CHF with small amount left pleural fluid. Infection in the lung bases is not excluded. Electronically Signed   By: Elberta Fortis M.D.   On: 01/30/2016 16:34   Ct Head Wo Contrast  01/30/2016  CLINICAL DATA:  Altered mental status EXAM: CT HEAD WITHOUT CONTRAST TECHNIQUE: Contiguous axial images were obtained from the base of the skull through the vertex without intravenous contrast. COMPARISON:  01/11/2016 FINDINGS: No skull fracture is noted. Paranasal sinuses shows mild mucosal thickening bilateral ethmoid air cells. There is mucosal thickening with complete opacification of the left frontal sinus. Fluid and partial opacification of left mastoid air cells. Atherosclerotic calcifications of carotid siphon are noted. Stable cerebral atrophy. No intracranial hemorrhage, mass effect or midline shift. Atherosclerotic calcifications of vertebral arteries again noted. Mild periventricular chronic white matter disease stable. IMPRESSION: No acute intracranial abnormality. Stable mild cerebral atrophy. Stable chronic mild periventricular white matter disease. Again noted atherosclerotic calcifications of carotid siphon and vertebral arteries. Electronically Signed   By: Natasha Mead M.D.   On: 01/30/2016 16:56    EKG:   Orders placed or performed during the hospital encounter of 01/23/16  . ED EKG  . ED EKG  . EKG 12-Lead  . EKG 12-Lead  . EKG 12-Lead  . EKG 12-Lead    ASSESSMENT AND PLAN:   45 year old male with known history of hypertension, diabetes mellitus, end-stage renal disease on hemodialysis, history of C. difficile colitis in December 2016, peripheral vascular disease status post amputation of toes admitted to the  hospital secondary to hypotension noted after dialysis.  #1 Hypotension- hypovolemic, Received fluids. Improved -  #2 diarrhea-Improved, no further diarrhea now. Negative c.diff. last C. difficile was positive in December.  - Continue probiotics while on antibiotics  #3 End-stage renal disease on hemodialysis-on Tuesday, Thursday and Saturday hemodialysis.  Anemia of chronic disease;transfuse one unit,prbc,check stool guaic.   #4 chronic diastolic congestive heart failure- well compensated - #5 diabetes mellitus- to poor by mouth intake and lethargy hold the Lantus  . #6 UTI- has chronic suprapubic catheter - changed on admission -  urine Cultures showed Candida. Patient is on Diflucan at this time. #7 GERD- protonix  #8 DVT Prophylaxis- SQ heparin  #9 Left thigh pain- likely muscle spasms- ultrasound is negative for DVT.  #10. Lethargy likely secondary to metabolic encephalopathy' underlying metabolic causes: Ordered ABG, head CT. Continue to treat infection, rpt chest x-ray, blood cultures. Avoid  Narcotics.  #11 .stage II sacral ulcer, stage I left lateral heel ulcer: Wound care consult. 12.lethargy;ABG ok.cbc ,bmp ok.blood cultures pending.Ammonia norma. CThead no stroke.check MRI brain discontinues amytriptyline,celexa,percocet. All the records are reviewed and case discussed with Care Management/Social Workerr. Management plans discussed with the patient, family and they are in agreement.  CODE STATUS: DO NOT RESUSCITATE  TOTAL TIME TAKING CARE OF THIS PATIENT: 25 minutes.   POSSIBLE D/C IN 1 DAYS, DEPENDING ON CLINICAL CONDITION.   Katha Hamming M.D on 01/31/2016 at 3:07 PM  Between 7am to 6pm - Pager - [phone]  After 6pm go to [email or web address] - password [email or web address] [facility]  [facility]  Office  [phone]  CC: Primary care physician; [name], MD

## 2016-01-31 NOTE — Progress Notes (Signed)
[facility]  ROUNDING NOTE   Subjective:  Patient lethargic but is arousable. He had hemodialysis yesterday.   Objective:  Vital signs in last 24 hours:  Temp:  [97.4 F (36.3 C)-98.7 F (37.1 C)] 98.6 F (37 C) (03/24 0855) Pulse Rate:  [73-93] 88 (03/24 0855) Resp:  [9-18] 16 (03/24 0855) BP: (100-126)/(49-75) 104/60 mmHg (03/24 0855) SpO2:  [100 %] 100 % (03/24 0855) Weight:  [80.922 kg (178 lb 6.4 oz)-85.5 kg (188 lb 7.9 oz)] 80.922 kg (178 lb 6.4 oz) (03/24 0502)  Weight change:  Filed Weights   01/30/16 1130 01/30/16 1535 01/31/16 0502  Weight: 85.5 kg (188 lb 7.9 oz) 84 kg (185 lb 3 oz) 80.922 kg (178 lb 6.4 oz)    Intake/Output: I/O last 3 completed shifts: In: 240 [P.O.:240] Out: 1525 [Urine:25; Other:1500]   Intake/Output this shift:  Total I/O In: 60 [P.O.:60] Out: 15 [Urine:15]  Physical Exam: General: Chronically ill appearing  Head: Normocephalic, atraumatic. Moist oral mucosal membranes  Eyes: Anicteric  Neck: Supple, trachea midline  Lungs:  Clear to auscultation normal effort  Heart: S1S2 no rubs  Abdomen:  Soft, nontender, distended, BS present   Extremities: 1+ peripheral edema.  Neurologic:  sleeping but arousable   Skin: No lesions  Access: LUE AVF    Basic Metabolic Panel:  Recent Labs Lab 01/25/16 1358 01/28/16 0655 01/30/16 0546  NA  --  130*  --   K  --  5.0  --   CL  --  96*  --   CO2  --  23  --   GLUCOSE  --  148*  --   BUN  --  41*  --   CREATININE  --  4.22* 3.83*  CALCIUM  --  8.5*  --   PHOS 4.5  --   --     Liver Function Tests: No results for input(s): AST, ALT, ALKPHOS, BILITOT, PROT, ALBUMIN in the last 168 hours. No results for input(s): LIPASE, AMYLASE in the last 168 hours. No results for input(s): AMMONIA in the last 168 hours.  CBC:  Recent Labs Lab 01/28/16 0655 01/31/16 0501  WBC 8.8 3.4*  HGB 7.9* 7.7*  HCT 24.6* 23.9*  MCV 89.0 90.8  PLT 125* 145*    Cardiac Enzymes: No  results for input(s): CKTOTAL, CKMB, CKMBINDEX, TROPONINI in the last 168 hours.  BNP: Invalid input(s): POCBNP  CBG:  Recent Labs Lab 01/30/16 1725 01/30/16 2107 01/31/16 0107 01/31/16 0443 01/31/16 0729  GLUCAP 91 107* 129* 126* 83    Microbiology: Results for orders placed or performed during the hospital encounter of 01/23/16  Blood culture (routine x 2)     Status: None   Collection Time: 01/23/16  8:42 AM  Result Value Ref Range Status   Specimen Description BLOOD RIGHT AC  Final   Special Requests   Final    BOTTLES DRAWN AEROBIC AND ANAEROBIC AER ANA   Culture NO GROWTH 5 DAYS  Final   Report Status 01/28/2016 FINAL  Final  Blood culture (routine x 2)     Status: None   Collection Time: 01/23/16  8:42 AM  Result Value Ref Range Status   Specimen Description BLOOD RIGHT HAND  Final   Special Requests   Final    BOTTLES DRAWN AEROBIC AND ANAEROBIC AER ANA   Culture NO GROWTH 5 DAYS  Final   Report Status 01/28/2016 FINAL  Final  Urine culture  Status: None   Collection Time: 01/23/16  8:42 AM  Result Value Ref Range Status   Specimen Description URINE, CATHETERIZED  Final   Special Requests Normal  Final   Culture >=100,000 COLONIES/mL CANDIDA ALBICANS  Final   Report Status 01/28/2016 FINAL  Final  C difficile quick scan w PCR reflex     Status: None   Collection Time: 01/23/16  5:51 PM  Result Value Ref Range Status   C Diff antigen NEGATIVE NEGATIVE Final   C Diff toxin NEGATIVE NEGATIVE Final   C Diff interpretation Negative for C. difficile  Final    Coagulation Studies: No results for input(s): LABPROT, INR in the last 72 hours.  Urinalysis:  Recent Labs  01/29/16 1505  COLORURINE AMBER*  LABSPEC 1.016  PHURINE 8.0  GLUCOSEU NEGATIVE  HGBUR 1+*  BILIRUBINUR NEGATIVE  KETONESUR NEGATIVE  PROTEINUR 100*  NITRITE NEGATIVE  LEUKOCYTESUR 3+*      Imaging: Dg Chest 1 View  01/30/2016  CLINICAL DATA:  Weakness,  lethargy.  Dialysis patient. EXAM: CHEST 1 VIEW COMPARISON:  01/23/2016, 01/08/2016 and 01/07/2016 FINDINGS: Sternotomy wires are unchanged. Lungs are adequately inflated and demonstrate persistent hazy bibasilar opacification and mild hazy perihilar prominence suggesting mild vascular congestion with possible small amount left pleural fluid. Linear right midlung atelectasis. Stable cardiomegaly. Remainder of the exam is unchanged. IMPRESSION: Findings likely representing persistent mild CHF with small amount left pleural fluid. Infection in the lung bases is not excluded. Electronically Signed   By: Elberta Fortis M.D.   On: 01/30/2016 16:34   Ct Head Wo Contrast  01/30/2016  CLINICAL DATA:  Altered mental status EXAM: CT HEAD WITHOUT CONTRAST TECHNIQUE: Contiguous axial images were obtained from the base of the skull through the vertex without intravenous contrast. COMPARISON:  01/11/2016 FINDINGS: No skull fracture is noted. Paranasal sinuses shows mild mucosal thickening bilateral ethmoid air cells. There is mucosal thickening with complete opacification of the left frontal sinus. Fluid and partial opacification of left mastoid air cells. Atherosclerotic calcifications of carotid siphon are noted. Stable cerebral atrophy. No intracranial hemorrhage, mass effect or midline shift. Atherosclerotic calcifications of vertebral arteries again noted. Mild periventricular chronic white matter disease stable. IMPRESSION: No acute intracranial abnormality. Stable mild cerebral atrophy. Stable chronic mild periventricular white matter disease. Again noted atherosclerotic calcifications of carotid siphon and vertebral arteries. Electronically Signed   By: Natasha Mead M.D.   On: 01/30/2016 16:56     Medications:     . acidophilus  1 capsule Oral Daily  . amitriptyline  25 mg Oral QHS  . aspirin EC  81 mg Oral Daily  . calcium-vitamin D  1 tablet Oral Daily  . chlorhexidine  15 mL Mouth Rinse BID  . citalopram   20 mg Oral Daily  . clopidogrel  75 mg Oral Daily  . docusate sodium  100 mg Oral BID  . epoetin (EPOGEN/PROCRIT) injection  10,000 Units Intravenous Q T,Th,Sa-HD  . feeding supplement (NEPRO CARB STEADY)  237 mL Oral BID BM  . fluconazole  100 mg Oral Daily  . heparin subcutaneous  5,000 Units Subcutaneous Q12H  . insulin aspart  0-5 Units Subcutaneous QHS  . insulin aspart  0-9 Units Subcutaneous TID WC  . insulin glargine  10 Units Subcutaneous QHS  . levothyroxine  75 mcg Oral QAC breakfast  . nystatin   Topical BID  . omega-3 acid ethyl esters  1 g Oral Daily   And  . vitamin E  100 Units  Oral Daily  . pantoprazole  40 mg Oral BID  . pregabalin  75 mg Oral BID  . rosuvastatin  20 mg Oral QHS  . sevelamer carbonate  800 mg Oral TID WC  . sodium chloride flush  3 mL Intravenous Q12H  . traZODone  50 mg Oral QHS   acetaminophen **OR** acetaminophen, albuterol, alum & mag hydroxide-simeth, diazepam, HYDROcodone-acetaminophen, midodrine, naLOXone (NARCAN)  injection, nitroGLYCERIN, ondansetron **OR** ondansetron (ZOFRAN) IV, senna-docusate  Assessment/ Plan:  45 y.o. 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   [facility] [address] TTS  1. ESRD on HD TTHS: patient completed hemodialysis yesterday.  No acute indication for dialysis today.  We will plan for dialysis again tomorrow.  2. Anemia of CKD: hemoglobin drifting down slowly.  Currently 7.7.  Continue Epogen 10,000 units with dialysis.  3. Secondary Hyperparathyroidism:  Continue  Renvela for phosphorous control.  PTH low at 60, therefore sensipar stopped.  4.  Hypotension: Blood pressure 104/60 this a.m. Continue midodrine.  5.  Altered mental status: mental status has been offered the past 2 days.  Remains lethargic but is arousable.   LOS: 8 Lachlyn Vanderstelt 3/24/201710:59 AM

## 2016-02-01 LAB — TYPE AND SCREEN
ABO/RH(D): O POS
Antibody Screen: NEGATIVE
UNIT DIVISION: 0

## 2016-02-01 LAB — GLUCOSE, CAPILLARY
GLUCOSE-CAPILLARY: 160 mg/dL — AB (ref 65–99)
GLUCOSE-CAPILLARY: 224 mg/dL — AB (ref 65–99)
Glucose-Capillary: 225 mg/dL — ABNORMAL HIGH (ref 65–99)
Glucose-Capillary: 247 mg/dL — ABNORMAL HIGH (ref 65–99)
Glucose-Capillary: 194 mg/dL — ABNORMAL HIGH (ref 65–99)
Glucose-Capillary: 128 mg/dL — ABNORMAL HIGH (ref 65–99)

## 2016-02-01 LAB — CBC
HEMATOCRIT: 27 % — AB (ref 40.0–52.0)
HEMOGLOBIN: 8.7 g/dL — AB (ref 13.0–18.0)
MCH: 28.5 pg (ref 26.0–34.0)
MCHC: 32.3 g/dL (ref 32.0–36.0)
MCV: 88.4 fL (ref 80.0–100.0)
Platelets: 147 10*3/uL — ABNORMAL LOW (ref 150–440)
RBC: 3.05 MIL/uL — AB (ref 4.40–5.90)
RDW: 17.2 % — ABNORMAL HIGH (ref 11.5–14.5)
WBC: 5 10*3/uL (ref 3.8–10.6)

## 2016-02-01 LAB — RENAL FUNCTION PANEL
ALBUMIN: 1.7 g/dL — AB (ref 3.5–5.0)
ANION GAP: 9 (ref 5–15)
BUN: 27 mg/dL — ABNORMAL HIGH (ref 6–20)
CO2: 28 mmol/L (ref 22–32)
Calcium: 8.5 mg/dL — ABNORMAL LOW (ref 8.9–10.3)
Chloride: 93 mmol/L — ABNORMAL LOW (ref 101–111)
Creatinine, Ser: 3.47 mg/dL — ABNORMAL HIGH (ref 0.61–1.24)
GFR calc Af Amer: 23 mL/min — ABNORMAL LOW (ref >60)
GFR, EST NON AFRICAN AMERICAN: 20 mL/min — AB (ref >60)
Glucose, Bld: 211 mg/dL — ABNORMAL HIGH (ref 65–99)
PHOSPHORUS: 3.3 mg/dL (ref 2.5–4.6)
POTASSIUM: 3.6 mmol/L (ref 3.5–5.1)
Sodium: 130 mmol/L — ABNORMAL LOW (ref 135–145)

## 2016-02-01 NOTE — Plan of Care (Signed)
Problem: Skin Integrity: Goal: Risk for impaired skin integrity will decrease Outcome: Not Met (add Reason) Pt currently has pressure ulcers; all are covered w/ dressings

## 2016-02-01 NOTE — Progress Notes (Signed)
[facility]  ROUNDING NOTE   Subjective:  Patient more awake and alert this AM.  Due for HD today, orders have been prepared.   Objective:  Vital signs in last 24 hours:  Temp:  [97.4 F (36.3 C)-98.2 F (36.8 C)] 97.6 F (36.4 C) (03/25 0932) Pulse Rate:  [87-91] 89 (03/25 0932) Resp:  [16-22] 22 (03/25 0932) BP: (110-141)/(60-78) 141/78 mmHg (03/25 0932) SpO2:  [100 %] 100 % (03/25 0932)  Weight change:  Filed Weights   01/30/16 1130 01/30/16 1535 01/31/16 0502  Weight: 85.5 kg (188 lb 7.9 oz) 84 kg (185 lb 3 oz) 80.922 kg (178 lb 6.4 oz)    Intake/Output: I/O last 3 completed shifts: In: 1163 [P.O.:810; I.V.:10; Blood:343] Out: 65 [Urine:65]   Intake/Output this shift:  Total I/O In: 240 [P.O.:240] Out: 7 [Urine:7]  Physical Exam: General: Chronically ill appearing  Head: Normocephalic, atraumatic. Moist oral mucosal membranes  Eyes: Anicteric  Neck: Supple, trachea midline  Lungs:  Clear to auscultation normal effort  Heart: S1S2 no rubs  Abdomen:  Soft, nontender, distended, BS present   Extremities: 1+ peripheral edema.  Neurologic: Awake, will follow simple commands  Skin: No lesions  Access: LUE AVF    Basic Metabolic Panel:  Recent Labs Lab 01/25/16 1358 01/28/16 0655 01/30/16 0546  NA  --  130*  --   K  --  5.0  --   CL  --  96*  --   CO2  --  23  --   GLUCOSE  --  148*  --   BUN  --  41*  --   CREATININE  --  4.22* 3.83*  CALCIUM  --  8.5*  --   PHOS 4.5  --   --     Liver Function Tests: No results for input(s): AST, ALT, ALKPHOS, BILITOT, PROT, ALBUMIN in the last 168 hours. No results for input(s): LIPASE, AMYLASE in the last 168 hours.  Recent Labs Lab 01/31/16 1221  AMMONIA 24    CBC:  Recent Labs Lab 01/28/16 0655 01/31/16 0501 01/31/16 2116  WBC 8.8 3.4*  --   HGB 7.9* 7.7* 8.3*  HCT 24.6* 23.9* 26.4*  MCV 89.0 90.8  --   PLT 125* 145*  --     Cardiac Enzymes: No results for input(s): CKTOTAL,  CKMB, CKMBINDEX, TROPONINI in the last 168 hours.  BNP: Invalid input(s): POCBNP  CBG:  Recent Labs Lab 01/31/16 1731 01/31/16 2129 02/01/16 0049 02/01/16 0519 02/01/16 0753  GLUCAP 173* 218* 224* 225* 247*    Microbiology: Results for orders placed or performed during the hospital encounter of 01/23/16  Blood culture (routine x 2)     Status: None   Collection Time: 01/23/16  8:42 AM  Result Value Ref Range Status   Specimen Description BLOOD RIGHT AC  Final   Special Requests   Final    BOTTLES DRAWN AEROBIC AND ANAEROBIC AER ANA   Culture NO GROWTH 5 DAYS  Final   Report Status 01/28/2016 FINAL  Final  Blood culture (routine x 2)     Status: None   Collection Time: 01/23/16  8:42 AM  Result Value Ref Range Status   Specimen Description BLOOD RIGHT HAND  Final   Special Requests   Final    BOTTLES DRAWN AEROBIC AND ANAEROBIC AER ANA   Culture NO GROWTH 5 DAYS  Final   Report Status 01/28/2016 FINAL  Final  Urine culture  Status: None   Collection Time: 01/23/16  8:42 AM  Result Value Ref Range Status   Specimen Description URINE, CATHETERIZED  Final   Special Requests Normal  Final   Culture >=100,000 COLONIES/mL CANDIDA ALBICANS  Final   Report Status 01/28/2016 FINAL  Final  C difficile quick scan w PCR reflex     Status: None   Collection Time: 01/23/16  5:51 PM  Result Value Ref Range Status   C Diff antigen NEGATIVE NEGATIVE Final   C Diff toxin NEGATIVE NEGATIVE Final   C Diff interpretation Negative for C. difficile  Final    Coagulation Studies: No results for input(s): LABPROT, INR in the last 72 hours.  Urinalysis:  Recent Labs  01/29/16 1505  COLORURINE AMBER*  LABSPEC 1.016  PHURINE 8.0  GLUCOSEU NEGATIVE  HGBUR 1+*  BILIRUBINUR NEGATIVE  KETONESUR NEGATIVE  PROTEINUR 100*  NITRITE NEGATIVE  LEUKOCYTESUR 3+*      Imaging: Dg Chest 1 View  01/30/2016  CLINICAL DATA:  Weakness, lethargy.  Dialysis patient. EXAM:  CHEST 1 VIEW COMPARISON:  01/23/2016, 01/08/2016 and 01/07/2016 FINDINGS: Sternotomy wires are unchanged. Lungs are adequately inflated and demonstrate persistent hazy bibasilar opacification and mild hazy perihilar prominence suggesting mild vascular congestion with possible small amount left pleural fluid. Linear right midlung atelectasis. Stable cardiomegaly. Remainder of the exam is unchanged. IMPRESSION: Findings likely representing persistent mild CHF with small amount left pleural fluid. Infection in the lung bases is not excluded. Electronically Signed   By: Elberta Fortis M.D.   On: 01/30/2016 16:34   Ct Head Wo Contrast  01/30/2016  CLINICAL DATA:  Altered mental status EXAM: CT HEAD WITHOUT CONTRAST TECHNIQUE: Contiguous axial images were obtained from the base of the skull through the vertex without intravenous contrast. COMPARISON:  01/11/2016 FINDINGS: No skull fracture is noted. Paranasal sinuses shows mild mucosal thickening bilateral ethmoid air cells. There is mucosal thickening with complete opacification of the left frontal sinus. Fluid and partial opacification of left mastoid air cells. Atherosclerotic calcifications of carotid siphon are noted. Stable cerebral atrophy. No intracranial hemorrhage, mass effect or midline shift. Atherosclerotic calcifications of vertebral arteries again noted. Mild periventricular chronic white matter disease stable. IMPRESSION: No acute intracranial abnormality. Stable mild cerebral atrophy. Stable chronic mild periventricular white matter disease. Again noted atherosclerotic calcifications of carotid siphon and vertebral arteries. Electronically Signed   By: Natasha Mead M.D.   On: 01/30/2016 16:56   Mr Brain Wo Contrast  01/31/2016  CLINICAL DATA:  Renal failure patient with worsening weakness over the last few days and altered mental status. EXAM: MRI HEAD WITHOUT CONTRAST TECHNIQUE: Multiplanar, multiecho pulse sequences of the brain and surrounding  structures were obtained without intravenous contrast. COMPARISON:  CT 01/30/2016.  MRI 10/11/2009. FINDINGS: Diffusion imaging does not show any acute or subacute infarction. Brainstem and cerebellum are normal. Cerebral hemispheres show a few small foci of T2 and FLAIR signal in the white matter consistent with minimal small vessel change. There is an old small vessel infarction affecting the left thalamus. No large vessel territory infarction. No evidence of mass lesion, hemorrhage, hydrocephalus or extra-axial collection. Question thrombosis or partial thrombosis of the right transverse sinus. The differential diagnosis is slow flow. This could be evaluated with CT venography. As a predisposing factor, there is fluid throughout the mastoid air cells bilaterally which could be associated with sinus thrombosis. Mild mucosal inflammatory changes are present throughout the paranasal sinuses is well. There appears to be abnormal flow in the  distal left vertebral artery. IMPRESSION: No sign of acute infarction. Mild chronic small-vessel disease. Question thrombosis or partial thrombosis of the right transverse sinus. This could be simulated by slow flow however. CT venography could better assess for that possibility. Abnormal flow in the distal left vertebral artery. This could be occluded or nearly occluded. Fluid throughout the mastoid air cells bilaterally. Electronically Signed   By: Paulina Fusi M.D.   On: 01/31/2016 16:51     Medications:     . acidophilus  1 capsule Oral Daily  . aspirin EC  81 mg Oral Daily  . calcium-vitamin D  1 tablet Oral Daily  . chlorhexidine  15 mL Mouth Rinse BID  . clopidogrel  75 mg Oral Daily  . docusate sodium  100 mg Oral BID  . epoetin (EPOGEN/PROCRIT) injection  10,000 Units Intravenous Q T,Th,Sa-HD  . feeding supplement (NEPRO CARB STEADY)  237 mL Oral BID BM  . fluconazole  100 mg Oral Daily  . heparin subcutaneous  5,000 Units Subcutaneous Q12H  . insulin  aspart  0-5 Units Subcutaneous QHS  . insulin aspart  0-9 Units Subcutaneous TID WC  . insulin glargine  6 Units Subcutaneous QHS  . levothyroxine  75 mcg Oral QAC breakfast  . nystatin   Topical BID  . omega-3 acid ethyl esters  1 g Oral Daily   And  . vitamin E  100 Units Oral Daily  . pantoprazole  40 mg Oral BID  . pregabalin  75 mg Oral BID  . rosuvastatin  20 mg Oral QHS  . sevelamer carbonate  800 mg Oral TID WC  . sodium chloride flush  3 mL Intravenous Q12H   acetaminophen **OR** acetaminophen, albuterol, alum & mag hydroxide-simeth, midodrine, naLOXone (NARCAN)  injection, nitroGLYCERIN, ondansetron **OR** ondansetron (ZOFRAN) IV, senna-docusate, temazepam  Assessment/ Plan:  45 y.o. 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   [facility] TTS  1. ESRD on HD TTHS: Patient due for hemodialysis today. Orders have been prepared.  2. Anemia of CKD: Hemoglobin up to 8.3. Continue Epogen 10,000 units IV with dialysis.  3. Secondary Hyperparathyroidism:  Continue Renvela for phosphorous control.  PTH low at 60, therefore sensipar stopped.  4.  Hypotension: Blood pressure better this a.m. at 141/78. Continue midodrine.  5.  Altered mental status: Patient more awake and alert this a.m. Avoid pain medicines if possible.  LOS: 9 Ilamae Geng 3/25/20179:50 AM

## 2016-02-01 NOTE — Progress Notes (Addendum)
[facility] [place] at [facility]   PATIENT NAME: Taylor [name]    MR#:  [id number]  DATE OF BIRTH:  02-Apr-1971  SUBJECTIVE: Alert, awake, oriented.. Patient complains of back pain. Patient's brother is at bedside. He is not lethargic anymore. Able to eat breakfast.   CHIEF COMPLAINT:   Chief Complaint  Patient presents with  . Hypotension  . Weakness   - Admitted with hypotension after dialysis. Blood pressure is improved. Lab showing urine infection. On antibiotics. ..  REVIEW OF SYSTEMS:  Review of Systems  Constitutional: Negative for chills.  HENT: Positive for hearing loss. Negative for ear discharge, ear pain and nosebleeds.   Eyes: Negative for blurred vision and double vision.  Respiratory: Negative for cough, shortness of breath and wheezing.   Cardiovascular: Negative for chest pain, palpitations and leg swelling.       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  Genitourinary: Negative for dysuria and urgency.       Has a suprapubic catheter  Musculoskeletal: Positive for myalgias.       Left thigh cramps and pain  Neurological: Positive for weakness. Negative for dizziness, sensory change, speech change, focal weakness, seizures and headaches.       Bilateral lower extremity weakness  Psychiatric/Behavioral: Negative for depression.    DRUG ALLERGIES:   Allergies  Allergen Reactions  . Amoxicillin-Pot Clavulanate Nausea And Vomiting and Itching  . 2nd Skin Quick Heal Other (See Comments)  . Rifampin Nausea And Vomiting  . Tape Other (See Comments)    VITALS:  Blood pressure 141/78, pulse 89, temperature 97.6 F (36.4 C), temperature source Oral, resp. rate 22, height 6\' 3"  (1.905 m), weight 80.922 kg (178 lb 6.4 oz), SpO2 100 %.  PHYSICAL EXAMINATION:  Physical Exam  GENERAL:  45 y.o.-year-old patient  lying in the bed with no acute distress.  Chronically ill-l looking male. 57l EYES: Pupils fixator. [name] and accommodation. No scleral icterus. Extraocular muscles intact.  HEENT: Head atraumatic, normocephalic. Oropharynx and nasopharynx clear.  NECK:  Supple, no jugular venous distention. No thyroid enlargement, no tenderness.  LUNGS: Normal breath sounds bilaterally, no wheezing, rales,rhonchi or crepitation. No use of accessory muscles of respiration. Decreased bibasilar breath sounds CARDIOVASCULAR: S1, S2 normal. No  rubs, or gallops. Soft 3/6 systolic murmur is present ABDOMEN: Abdomen is distended but Soft, nontender. Bowel sounds present. No organomegaly or mass.  EXTREMITIES: No pedal edema, cyanosis, or clubbing. 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 NEUROLOGIC: She in cranial nerves II through XII intact. Has generalized weakness in the legs  PSYCHIATRIC; that, awake, oriented. Complains of back pain.  SKIN' stage II sacral ulcer present.  LABORATORY PANEL:   CBC  Recent Labs Lab 01/31/16 0501 01/31/16 2116  WBC 3.4*  --   HGB 7.7* 8.3*  HCT 23.9* 26.4*  PLT 145*  --    ------------------------------------------------------------------------------------------------------------------  Chemistries   Recent Labs Lab 01/28/16 0655 01/30/16 0546  NA 130*  --   K 5.0  --   CL 96*  --   CO2 23  --   GLUCOSE 148*  --   BUN 41*  --   CREATININE 4.22* 3.83*  CALCIUM 8.5*  --    ------------------------------------------------------------------------------------------------------------------  Cardiac Enzymes No results for input(s): TROPONINI in the last 168 hours. ------------------------------------------------------------------------------------------------------------------  RADIOLOGY:  Dg Chest 1 View  01/30/2016  CLINICAL  DATA:  Weakness, lethargy.  Dialysis patient. EXAM: CHEST 1 VIEW COMPARISON:  01/23/2016, 01/08/2016  and 01/07/2016 FINDINGS: Sternotomy wires are unchanged. Lungs are adequately inflated and demonstrate persistent hazy bibasilar opacification and mild hazy perihilar prominence suggesting mild vascular congestion with possible small amount left pleural fluid. Linear right midlung atelectasis. Stable cardiomegaly. Remainder of the exam is unchanged. IMPRESSION: Findings likely representing persistent mild CHF with small amount left pleural fluid. Infection in the lung bases is not excluded. Electronically Signed   By: Elberta Fortis M.D.   On: 01/30/2016 16:34   Ct Head Wo Contrast  01/30/2016  CLINICAL DATA:  Altered mental status EXAM: CT HEAD WITHOUT CONTRAST TECHNIQUE: Contiguous axial images were obtained from the base of the skull through the vertex without intravenous contrast. COMPARISON:  01/11/2016 FINDINGS: No skull fracture is noted. Paranasal sinuses shows mild mucosal thickening bilateral ethmoid air cells. There is mucosal thickening with complete opacification of the left frontal sinus. Fluid and partial opacification of left mastoid air cells. Atherosclerotic calcifications of carotid siphon are noted. Stable cerebral atrophy. No intracranial hemorrhage, mass effect or midline shift. Atherosclerotic calcifications of vertebral arteries again noted. Mild periventricular chronic white matter disease stable. IMPRESSION: No acute intracranial abnormality. Stable mild cerebral atrophy. Stable chronic mild periventricular white matter disease. Again noted atherosclerotic calcifications of carotid siphon and vertebral arteries. Electronically Signed   By: Natasha Mead M.D.   On: 01/30/2016 16:56   Mr Brain Wo Contrast  01/31/2016  CLINICAL DATA:  Renal failure patient with worsening weakness over the last few days and altered mental status. EXAM: MRI HEAD WITHOUT CONTRAST TECHNIQUE: Multiplanar, multiecho pulse sequences of the brain and surrounding structures were obtained without intravenous contrast.  COMPARISON:  CT 01/30/2016.  MRI 10/11/2009. FINDINGS: Diffusion imaging does not show any acute or subacute infarction. Brainstem and cerebellum are normal. Cerebral hemispheres show a few small foci of T2 and FLAIR signal in the white matter consistent with minimal small vessel change. There is an old small vessel infarction affecting the left thalamus. No large vessel territory infarction. No evidence of mass lesion, hemorrhage, hydrocephalus or extra-axial collection. Question thrombosis or partial thrombosis of the right transverse sinus. The differential diagnosis is slow flow. This could be evaluated with CT venography. As a predisposing factor, there is fluid throughout the mastoid air cells bilaterally which could be associated with sinus thrombosis. Mild mucosal inflammatory changes are present throughout the paranasal sinuses is well. There appears to be abnormal flow in the distal left vertebral artery. IMPRESSION: No sign of acute infarction. Mild chronic small-vessel disease. Question thrombosis or partial thrombosis of the right transverse sinus. This could be simulated by slow flow however. CT venography could better assess for that possibility. Abnormal flow in the distal left vertebral artery. This could be occluded or nearly occluded. Fluid throughout the mastoid air cells bilaterally. Electronically Signed   By: Paulina Fusi M.D.   On: 01/31/2016 16:51    EKG:   Orders placed or performed during the hospital encounter of 01/23/16  . ED EKG  . ED EKG  . EKG 12-Lead  . EKG 12-Lead  . EKG 12-Lead  . EKG 12-Lead    ASSESSMENT AND PLAN:   45 year old male with known history of hypertension, diabetes mellitus, end-stage renal disease on hemodialysis, history of C. difficile colitis in December 2016, peripheral vascular disease status post amputation of toes admitted to the hospital secondary to hypotension noted after dialysis.  #1 Hypotension- hypovolemic, Received fluids.  Improved -  #2 diarrhea-Improved, no further diarrhea now. Negative c.diff. last C. difficile was positive in December.  - Continue probiotics while on antibiotics  #3 End-stage renal disease on hemodialysis-on Tuesday, Thursday and Saturday hemodialysis.  Anemia of chronic disease;transfuse one unit,prbc,check stool guaic.   #4 chronic diastolic congestive heart failure- well compensated - #5 diabetes mellitus-continue Lantus, sliding scale insulin with coverage.  . #6 UTI- has chronic suprapubic catheter - changed on admission -  urine Cultures showed Candida. Patient is on Diflucan at this time.  #7 GERD- protonix  #8 DVT Prophylaxis- SQ heparin  #9 Left thigh pain- likely muscle spasms- ultrasound is negative for DVT.  #10. Lethargy likely secondary to metabolic encephalopathy' underlying metabolic causes: Ordered ABG, head CT. Continue to treat infection, rpt chest x-ray, blood cultures. Avoid  Narcotics.  #11 .stage II sacral ulcer, stage I left lateral heel ulcer: Wound care consult. 12.lethargy;ABG ok.cbc ,bmp ok.blood cultures pending.Ammonia norma. CThead no stroke.check MRI brain discontinued amytriptyline,celexa,percocet. Lethargy improved. Received One unit of packed RBC yesterday. Minimize  narcotic use. To prevent recurrent lethargy. Started on Restoril by psychiatry to help with sleep.  Discussed with the brother. Time spent is more than 35 minutes Other 50% of the time spent in coordination and counseling of the care. All the records are reviewed and case discussed with Care Management/Social Workerr. Management plans discussed with the patient, family and they are in agreement.  CODE STATUS: DO NOT RESUSCITATE  TOTAL TIME TAKING CARE OF THIS PATIENT: 40 minutes.   POSSIBLE D/C IN 1 DAYS, DEPENDING ON CLINICAL CONDITION.   Katha Hamming M.D on 02/01/2016 at 11:01 AM  Between 7am to 6pm - Pager - [phone]  After 6pm go to [email or web address] -  password [email or web address] [facility]  [facility]  Office  [phone]  CC: Primary care physician; [name], MD

## 2016-02-02 LAB — GLUCOSE, CAPILLARY
GLUCOSE-CAPILLARY: 108 mg/dL — AB (ref 65–99)
GLUCOSE-CAPILLARY: 258 mg/dL — AB (ref 65–99)
GLUCOSE-CAPILLARY: 217 mg/dL — AB (ref 65–99)
Glucose-Capillary: 130 mg/dL — ABNORMAL HIGH (ref 65–99)
Glucose-Capillary: 100 mg/dL — ABNORMAL HIGH (ref 65–99)
Glucose-Capillary: 116 mg/dL — ABNORMAL HIGH (ref 65–99)

## 2016-02-02 NOTE — Progress Notes (Signed)
[facility]  ROUNDING NOTE   Subjective:  Patient had hemodialysis yesterday. Appears to be a bit more lethargic today but is arousable.    Objective:  Vital signs in last 24 hours:  Temp:  [96 F (35.6 C)-98.1 F (36.7 C)] 97.7 F (36.5 C) (03/25 2116) Pulse Rate:  [84-98] 84 (03/25 2116) Resp:  [18-21] 18 (03/25 2116) BP: (129-142)/(62-78) 129/62 mmHg (03/25 2116) SpO2:  [94 %-99 %] 94 % (03/25 2116) Weight:  [83.8 kg (184 lb 11.9 oz)] 83.8 kg (184 lb 11.9 oz) (03/25 1225)  Weight change:  Filed Weights   01/30/16 1535 01/31/16 0502 02/01/16 1225  Weight: 84 kg (185 lb 3 oz) 80.922 kg (178 lb 6.4 oz) 83.8 kg (184 lb 11.9 oz)    Intake/Output: I/O last 3 completed shifts: In: 1130 [P.O.:777; I.V.:10; Blood:343] Out: 2512 [Urine:12; Other:2500]   Intake/Output this shift:  Total I/O In: 100 [P.O.:100] Out: -   Physical Exam: General: Chronically ill appearing  Head: Normocephalic, atraumatic. Moist oral mucosal membranes  Eyes: Anicteric  Neck: Supple, trachea midline  Lungs:  Clear to auscultation normal effort  Heart: S1S2 no rubs  Abdomen:  Soft, nontender, distended, BS present   Extremities: 1+ peripheral edema.  Neurologic: Lethargic but arousable   Skin: No lesions  Access: LUE AVF    Basic Metabolic Panel:  Recent Labs Lab 01/28/16 0655 01/30/16 0546 02/01/16 1231  NA 130*  --  130*  K 5.0  --  3.6  CL 96*  --  93*  CO2 23  --  28  GLUCOSE 148*  --  211*  BUN 41*  --  27*  CREATININE 4.22* 3.83* 3.47*  CALCIUM 8.5*  --  8.5*  PHOS  --   --  3.3    Liver Function Tests:  Recent Labs Lab 02/01/16 1231  ALBUMIN 1.7*   No results for input(s): LIPASE, AMYLASE in the last 168 hours.  Recent Labs Lab 01/31/16 1221  AMMONIA 24    CBC:  Recent Labs Lab 01/28/16 0655 01/31/16 0501 01/31/16 2116 02/01/16 1231  WBC 8.8 3.4*  --  5.0  HGB 7.9* 7.7* 8.3* 8.7*  HCT 24.6* 23.9* 26.4* 27.0*  MCV 89.0 90.8  --  88.4   PLT 125* 145*  --  147*    Cardiac Enzymes: No results for input(s): CKTOTAL, CKMB, CKMBINDEX, TROPONINI in the last 168 hours.  BNP: Invalid input(s): POCBNP  CBG:  Recent Labs Lab 02/01/16 2113 02/02/16 0103 02/02/16 0505 02/02/16 0755 02/02/16 1141  GLUCAP 160* 130* 100* 108* 116*    Microbiology: Results for orders placed or performed during the hospital encounter of 01/23/16  Blood culture (routine x 2)     Status: None   Collection Time: 01/23/16  8:42 AM  Result Value Ref Range Status   Specimen Description BLOOD RIGHT AC  Final   Special Requests   Final    BOTTLES DRAWN AEROBIC AND ANAEROBIC AER ANA   Culture NO GROWTH 5 DAYS  Final   Report Status 01/28/2016 FINAL  Final  Blood culture (routine x 2)     Status: None   Collection Time: 01/23/16  8:42 AM  Result Value Ref Range Status   Specimen Description BLOOD RIGHT HAND  Final   Special Requests   Final    BOTTLES DRAWN AEROBIC AND ANAEROBIC AER ANA   Culture NO GROWTH 5 DAYS  Final   Report Status 01/28/2016 FINAL  Final  Urine culture     Status: None   Collection Time: 01/23/16  8:42 AM  Result Value Ref Range Status   Specimen Description URINE, CATHETERIZED  Final   Special Requests Normal  Final   Culture >=100,000 COLONIES/mL CANDIDA ALBICANS  Final   Report Status 01/28/2016 FINAL  Final  C difficile quick scan w PCR reflex     Status: None   Collection Time: 01/23/16  5:51 PM  Result Value Ref Range Status   C Diff antigen NEGATIVE NEGATIVE Final   C Diff toxin NEGATIVE NEGATIVE Final   C Diff interpretation Negative for C. difficile  Final  CULTURE, BLOOD (ROUTINE X 2) w Reflex to PCR ID Panel     Status: None (Preliminary result)   Collection Time: 01/30/16  5:59 PM  Result Value Ref Range Status   Specimen Description BLOOD RIGHT WRIST  Final   Special Requests   Final    BOTTLES DRAWN AEROBIC AND ANAEROBIC AERO 12CC ANA 10CC   Culture NO GROWTH 2 DAYS  Final    Report Status PENDING  Incomplete  CULTURE, BLOOD (ROUTINE X 2) w Reflex to PCR ID Panel     Status: None (Preliminary result)   Collection Time: 01/30/16  6:06 PM  Result Value Ref Range Status   Specimen Description BLOOD RIGHT ASSIST CONTROL  Final   Special Requests   Final    BOTTLES DRAWN AEROBIC AND ANAEROBIC  AERO 12CC ANA 10CC   Culture NO GROWTH 2 DAYS  Final   Report Status PENDING  Incomplete    Coagulation Studies: No results for input(s): LABPROT, INR in the last 72 hours.  Urinalysis: No results for input(s): COLORURINE, LABSPEC, PHURINE, GLUCOSEU, HGBUR, BILIRUBINUR, KETONESUR, PROTEINUR, UROBILINOGEN, NITRITE, LEUKOCYTESUR in the last 72 hours.  Invalid input(s): APPERANCEUR    Imaging: Mr Sherrin Daisy Contrast  01/31/2016  CLINICAL DATA:  Renal failure patient with worsening weakness over the last few days and altered mental status. EXAM: MRI HEAD WITHOUT CONTRAST TECHNIQUE: Multiplanar, multiecho pulse sequences of the brain and surrounding structures were obtained without intravenous contrast. COMPARISON:  CT 01/30/2016.  MRI 10/11/2009. FINDINGS: Diffusion imaging does not show any acute or subacute infarction. Brainstem and cerebellum are normal. Cerebral hemispheres show a few small foci of T2 and FLAIR signal in the white matter consistent with minimal small vessel change. There is an old small vessel infarction affecting the left thalamus. No large vessel territory infarction. No evidence of mass lesion, hemorrhage, hydrocephalus or extra-axial collection. Question thrombosis or partial thrombosis of the right transverse sinus. The differential diagnosis is slow flow. This could be evaluated with CT venography. As a predisposing factor, there is fluid throughout the mastoid air cells bilaterally which could be associated with sinus thrombosis. Mild mucosal inflammatory changes are present throughout the paranasal sinuses is well. There appears to be abnormal flow in the  distal left vertebral artery. IMPRESSION: No sign of acute infarction. Mild chronic small-vessel disease. Question thrombosis or partial thrombosis of the right transverse sinus. This could be simulated by slow flow however. CT venography could better assess for that possibility. Abnormal flow in the distal left vertebral artery. This could be occluded or nearly occluded. Fluid throughout the mastoid air cells bilaterally. Electronically Signed   By: Paulina Fusi M.D.   On: 01/31/2016 16:51     Medications:     . acidophilus  1 capsule Oral Daily  . aspirin EC  81 mg Oral Daily  . calcium-vitamin D  1 tablet Oral Daily  . chlorhexidine  15 mL Mouth Rinse BID  . clopidogrel  75 mg Oral Daily  . docusate sodium  100 mg Oral BID  . epoetin (EPOGEN/PROCRIT) injection  10,000 Units Intravenous Q T,Th,Sa-HD  . feeding supplement (NEPRO CARB STEADY)  237 mL Oral BID BM  . fluconazole  100 mg Oral Daily  . heparin subcutaneous  5,000 Units Subcutaneous Q12H  . insulin aspart  0-5 Units Subcutaneous QHS  . insulin aspart  0-9 Units Subcutaneous TID WC  . insulin glargine  6 Units Subcutaneous QHS  . levothyroxine  75 mcg Oral QAC breakfast  . nystatin   Topical BID  . omega-3 acid ethyl esters  1 g Oral Daily   And  . vitamin E  100 Units Oral Daily  . pantoprazole  40 mg Oral BID  . pregabalin  75 mg Oral BID  . rosuvastatin  20 mg Oral QHS  . sevelamer carbonate  800 mg Oral TID WC  . sodium chloride flush  3 mL Intravenous Q12H   acetaminophen **OR** acetaminophen, albuterol, alum & mag hydroxide-simeth, midodrine, naLOXone (NARCAN)  injection, nitroGLYCERIN, ondansetron **OR** ondansetron (ZOFRAN) IV, senna-docusate, temazepam  Assessment/ Plan:  45 y.o. 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   [facility] [address] TTS  1. ESRD on HD TTHS: Patient completed dialysis yesterday. No acute indication for dialysis today. Next dialysis on Tuesday.  2. Anemia of CKD: Hemoglobin up slightly today 0.7. Continue Epogen with dialysis.  3. Secondary Hyperparathyroidism:  Continue Renvela for phosphorous control.  PTH low at 60, therefore sensipar stopped.  4.  Hypotension: Blood pressure 129/62 today. Continue midodrine.  5.  Altered mental status: Patient continues to have fluctuations in his mental status. Continue to monitor this closely. He appears to have a urinary tract infection which is being managed by hospitalist.  LOS: 10 Nemiah Kissner 3/26/201712:18 PM

## 2016-02-02 NOTE — Progress Notes (Signed)
Brother at pts bedside.  Informed me of patients Right Eye "wandering."    Upon assessment, pt Right does wonder inward and outward when trying to focus.  Will notify MD and monitor for any additional changes.

## 2016-02-02 NOTE — Progress Notes (Signed)
[facility] [place] [facility]   PATIENT NAME: Taylor [name]    MR#:  [id number]  DATE OF BIRTH:  1971-09-09  SUBJECTIVE: Alert, awake, oriented.. Patient complains of back pain. Patient's brother is at bedside. He is not lethargic anymore. Able to eat breakfast.   CHIEF COMPLAINT:   Chief Complaint  Patient presents with  . Hypotension  . Weakness   - Admitted with hypotension after dialysis. Blood pressure is improved. Lab showing urine infection. On antibiotics. ..  REVIEW OF SYSTEMS:  Review of Systems  Constitutional: Negative for chills.  HENT: Positive for hearing loss. Negative for ear discharge, ear pain and nosebleeds.   Eyes: Negative for blurred vision and double vision.  Respiratory: Negative for cough, shortness of breath and wheezing.   Cardiovascular: Negative for chest pain, palpitations and leg swelling.       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  Genitourinary: Negative for dysuria and urgency.       Has a suprapubic catheter  Musculoskeletal: Positive for myalgias.       Left thigh cramps and pain  Neurological: Positive for weakness. Negative for dizziness, sensory change, speech change, focal weakness, seizures and headaches.       Bilateral lower extremity weakness  Psychiatric/Behavioral: Negative for depression.    DRUG ALLERGIES:   Allergies  Allergen Reactions  . Amoxicillin-Pot Clavulanate Nausea And Vomiting and Itching  . 2nd Skin Quick Heal Other (See Comments)  . Rifampin Nausea And Vomiting  . Tape Other (See Comments)    VITALS:  Blood pressure 129/62, pulse 84, temperature 97.7 F (36.5 C), temperature source Axillary, resp. rate 18, height 6\' 3"  (1.905 m), weight 83.8 kg (184 lb 11.9 oz), SpO2 94 %.  PHYSICAL EXAMINATION:  Physical Exam  GENERAL:  45 y.o.-year-old  patient lying in the bed with no acute distress.  Chronically ill-l looking male. 57l EYES: Pupils fixator. [name] and accommodation. No scleral icterus. Extraocular muscles intact.  HEENT: Head atraumatic, normocephalic. Oropharynx and nasopharynx clear.  NECK:  Supple, no jugular venous distention. No thyroid enlargement, no tenderness.  LUNGS: Normal breath sounds bilaterally, no wheezing, rales,rhonchi or crepitation. No use of accessory muscles of respiration. Decreased bibasilar breath sounds CARDIOVASCULAR: S1, S2 normal. No  rubs, or gallops. Soft 3/6 systolic murmur is present ABDOMEN: Abdomen is distended but Soft, nontender. Bowel sounds present. No organomegaly or mass.  EXTREMITIES: No pedal edema, cyanosis, or clubbing. 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 NEUROLOGIC: She in cranial nerves II through XII intact. Has generalized weakness in the legs  PSYCHIATRIC; that, awake, oriented. Complains of back pain.  SKIN' stage II sacral ulcer present.  LABORATORY PANEL:   CBC  Recent Labs Lab 02/01/16 1231  WBC 5.0  HGB 8.7*  HCT 27.0*  PLT 147*   ------------------------------------------------------------------------------------------------------------------  Chemistries   Recent Labs Lab 02/01/16 1231  NA 130*  K 3.6  CL 93*  CO2 28  GLUCOSE 211*  BUN 27*  CREATININE 3.47*  CALCIUM 8.5*   ------------------------------------------------------------------------------------------------------------------  Cardiac Enzymes No results for input(s): TROPONINI in the last 168 hours. ------------------------------------------------------------------------------------------------------------------  RADIOLOGY:  Mr Brain Wo Contrast  01/31/2016  CLINICAL DATA:  Renal failure patient with worsening weakness over the last few days and altered mental status. EXAM: MRI HEAD WITHOUT CONTRAST TECHNIQUE: Multiplanar, multiecho pulse  sequences of the brain and surrounding structures  were obtained without intravenous contrast. COMPARISON:  CT 01/30/2016.  MRI 10/11/2009. FINDINGS: Diffusion imaging does not show any acute or subacute infarction. Brainstem and cerebellum are normal. Cerebral hemispheres show a few small foci of T2 and FLAIR signal in the white matter consistent with minimal small vessel change. There is an old small vessel infarction affecting the left thalamus. No large vessel territory infarction. No evidence of mass lesion, hemorrhage, hydrocephalus or extra-axial collection. Question thrombosis or partial thrombosis of the right transverse sinus. The differential diagnosis is slow flow. This could be evaluated with CT venography. As a predisposing factor, there is fluid throughout the mastoid air cells bilaterally which could be associated with sinus thrombosis. Mild mucosal inflammatory changes are present throughout the paranasal sinuses is well. There appears to be abnormal flow in the distal left vertebral artery. IMPRESSION: No sign of acute infarction. Mild chronic small-vessel disease. Question thrombosis or partial thrombosis of the right transverse sinus. This could be simulated by slow flow however. CT venography could better assess for that possibility. Abnormal flow in the distal left vertebral artery. This could be occluded or nearly occluded. Fluid throughout the mastoid air cells bilaterally. Electronically Signed   By: Paulina Fusi M.D.   On: 01/31/2016 16:51    EKG:   Orders placed or performed during the hospital encounter of 01/23/16  . ED EKG  . ED EKG  . EKG 12-Lead  . EKG 12-Lead  . EKG 12-Lead  . EKG 12-Lead    ASSESSMENT AND PLAN:   45 year old male with known history of hypertension, diabetes mellitus, end-stage renal disease on hemodialysis, history of C. difficile colitis in December 2016, peripheral vascular disease status post amputation of toes admitted to the hospital secondary to  hypotension noted after dialysis.  #1 Hypotension- hypovolemic, Received fluids. Improved -  #2 diarrhea-Improved, no further diarrhea now. Negative c.diff. last C. difficile was positive in December.  - Continue probiotics while on antibiotics  #3 End-stage renal disease on hemodialysis-on Tuesday, Thursday and Saturday hemodialysis.  Anemia of chronic disease;transfuse one unit,prbc,check stool guaic.   #4 chronic diastolic congestive heart failure- well compensated - #5 diabetes mellitus-continue Lantus, sliding scale insulin with coverage.  . #6 UTI- has chronic suprapubic catheter - changed on admission -  urine Cultures showed Candida. Patient is on Diflucan at this time.  #7 GERD- protonix  #8 DVT Prophylaxis- SQ heparin  #9 Left thigh pain- likely muscle spasms- ultrasound is negative for DVT.  #10. Lethargy likely secondary to metabolic encephalopathy' underlying metabolic causes: Ordered ABG, head CT. Continue to treat infection, rpt chest x-ray, blood cultures. Avoid  Narcotics.  #11 .stage II sacral ulcer, stage I left lateral heel ulcer: Wound care consult. 12.lethargy;ABG ok.cbc ,bmp ok.blood cultures pending.Ammonia norma. CThead no stroke.check MRI brain discontinued amytriptyline,celexa,percocet. Lethargy improved. Received One unit of packed RBC yesterday. Minimize  narcotic use. To prevent recurrent lethargy. Started on Restoril by psychiatry to help with sleep.  Discussed with the brother. Time spent is more than 35 minutes  Likely discharge tomorrow Other 50% of the time spent in coordination and counseling of the care. All the records are reviewed and case discussed with Care Management/Social Workerr. Management plans discussed with the patient, family and they are in agreement.  CODE STATUS: DO NOT RESUSCITATE  TOTAL TIME TAKING CARE OF THIS PATIENT: 25 minutes.   POSSIBLE D/C IN 1 DAYS, DEPENDING ON CLINICAL CONDITION.   Katha Hamming  M.D on 02/02/2016 at 11:04 AM  Between 7am to  6pm - Pager - [phone]  After 6pm go to [email or web address] - password [email or web address] [facility]  [facility]  Office  [phone]  CC: Primary care physician; [name], MD

## 2016-02-03 LAB — GLUCOSE, CAPILLARY
GLUCOSE-CAPILLARY: 211 mg/dL — AB (ref 65–99)
GLUCOSE-CAPILLARY: 223 mg/dL — AB (ref 65–99)
Glucose-Capillary: 142 mg/dL — ABNORMAL HIGH (ref 65–99)
Glucose-Capillary: 151 mg/dL — ABNORMAL HIGH (ref 65–99)

## 2016-02-03 MED ORDER — TRAMADOL HCL 50 MG PO TABS: 50.0000 mg | ORAL_TABLET | Freq: Three times a day (TID) | ORAL | Status: DC | PRN

## 2016-02-03 MED ORDER — ACETAMINOPHEN 325 MG PO TABS: 650.0000 mg | ORAL_TABLET | Freq: Four times a day (QID) | ORAL | Status: AC | PRN

## 2016-02-03 MED ORDER — SENNOSIDES-DOCUSATE SODIUM 8.6-50 MG PO TABS: 1.0000 | ORAL_TABLET | Freq: Every evening | ORAL | Status: AC | PRN

## 2016-02-03 MED ORDER — FLUCONAZOLE 100 MG PO TABS: 100.0000 mg | ORAL_TABLET | ORAL | Status: DC

## 2016-02-03 MED ORDER — TEMAZEPAM 7.5 MG PO CAPS: 7.5000 mg | ORAL_CAPSULE | Freq: Every evening | ORAL | Status: DC | PRN

## 2016-02-03 MED ORDER — INSULIN DETEMIR 100 UNIT/ML ~~LOC~~ SOLN: 7.0000 [IU] | Freq: Every day | SUBCUTANEOUS | Status: AC

## 2016-02-03 MED ORDER — INSULIN ASPART 100 UNIT/ML ~~LOC~~ SOLN: 0.0000 [IU] | Freq: Three times a day (TID) | SUBCUTANEOUS | Status: AC

## 2016-02-03 NOTE — Care Management Important Message (Signed)
Important Message  Patient Details  Name: Taylor [name] MRN: [id number] Date of Birth: 1971/05/20   Medicare Important Message Given:  Yes    Chapman Fitch, RN 02/03/2016, 2:24 PM

## 2016-02-03 NOTE — Clinical Social Work Note (Signed)
Patient is to discharge today to return to [facility]. CSW has contacted patient's father and he is in agreement. Patient to transport via EMS. Discharge information sent to [facility] and Jom[facility] Longs at [facility] is aware. York Spaniel MSW,LCSW [phone]

## 2016-02-03 NOTE — Progress Notes (Signed)
IV was removed. 

## 2016-02-03 NOTE — Progress Notes (Signed)
Inpatient Diabetes Program Recommendations  AACE/ADA: New Consensus Statement on Inpatient Glycemic Control (2015)  Target Ranges:  Prepandial:   less than 140 mg/dL      [facility] postprandial:   less than 180 mg/dL (1-2 hours)      Critically ill patients:  140 - 180 mg/dL   Review of Glycemic Control:  Results for TYE, VIGO (MRN [id number]) as of 02/03/2016 09:25  Ref. Range 02/02/2016 07:55 02/02/2016 11:41 02/02/2016 16:23 02/02/2016 20:37 02/03/2016 00:27 02/03/2016 04:46 02/03/2016 07:23  Glucose-Capillary Latest Ref Range: 65-99 mg/dL 546 (H) 270 (H) 350 (H) 217 (H) 211 (H) 142 (H) 151 (H)   Inpatient Diabetes Program Recommendations:   Please consider adding Novolog meal coverage 2 units tid with meals (hold if patient eats less than 50%).  Thanks, Beryl Meager, RN, BC-ADM Inpatient Diabetes Coordinator Pager [phone] (8a-5p)

## 2016-02-03 NOTE — Progress Notes (Signed)
[facility]  ROUNDING NOTE   Subjective:   Resting comfortably. Hemodialysis on Saturday.  UF 2.5 litre  Objective:  Vital signs in last 24 hours:  Temp:  [97.6 F (36.4 C)] 97.6 F (36.4 C) (03/26 2039) Pulse Rate:  [91-94] 91 (03/27 1222) Resp:  [16-18] 16 (03/27 1222) BP: (126-134)/(72) 134/72 mmHg (03/27 1222) SpO2:  [100 %] 100 % (03/27 1222)  Weight change:  Filed Weights   01/30/16 1535 01/31/16 0502 02/01/16 1225  Weight: 84 kg (185 lb 3 oz) 80.922 kg (178 lb 6.4 oz) 83.8 kg (184 lb 11.9 oz)    Intake/Output: I/O last 3 completed shifts: In: 100 [P.O.:100] Out: 25 [Urine:25]   Intake/Output this shift:  Total I/O In: 595 [P.O.:595] Out: -   Physical Exam: General: Chronically ill appearing  Head: Normocephalic, atraumatic. Moist oral mucosal membranes  Eyes: Anicteric  Neck: Supple, trachea midline  Lungs:  Clear to auscultation normal effort  Heart: S1S2 no rubs  Abdomen:  Soft, nontender, distended, BS present   Extremities: 1+ peripheral edema.  Neurologic: Lethargic but arousable   Skin: No lesions  Access: LUE AVF    Basic Metabolic Panel:  Recent Labs Lab 01/28/16 0655 01/30/16 0546 02/01/16 1231  NA 130*  --  130*  K 5.0  --  3.6  CL 96*  --  93*  CO2 23  --  28  GLUCOSE 148*  --  211*  BUN 41*  --  27*  CREATININE 4.22* 3.83* 3.47*  CALCIUM 8.5*  --  8.5*  PHOS  --   --  3.3    Liver Function Tests:  Recent Labs Lab 02/01/16 1231  ALBUMIN 1.7*   No results for input(s): LIPASE, AMYLASE in the last 168 hours.  Recent Labs Lab 01/31/16 1221  AMMONIA 24    CBC:  Recent Labs Lab 01/28/16 0655 01/31/16 0501 01/31/16 2116 02/01/16 1231  WBC 8.8 3.4*  --  5.0  HGB 7.9* 7.7* 8.3* 8.7*  HCT 24.6* 23.9* 26.4* 27.0*  MCV 89.0 90.8  --  88.4  PLT 125* 145*  --  147*    Cardiac Enzymes: No results for input(s): CKTOTAL, CKMB, CKMBINDEX, TROPONINI in the last 168 hours.  BNP: Invalid input(s):  POCBNP  CBG:  Recent Labs Lab 02/02/16 2037 02/03/16 0027 02/03/16 0446 02/03/16 0723 02/03/16 1125  GLUCAP 217* 211* 142* 151* 223*    Microbiology: Results for orders placed or performed during the hospital encounter of 01/23/16  Blood culture (routine x 2)     Status: None   Collection Time: 01/23/16  8:42 AM  Result Value Ref Range Status   Specimen Description BLOOD RIGHT AC  Final   Special Requests   Final    BOTTLES DRAWN AEROBIC AND ANAEROBIC AER ANA   Culture NO GROWTH 5 DAYS  Final   Report Status 01/28/2016 FINAL  Final  Blood culture (routine x 2)     Status: None   Collection Time: 01/23/16  8:42 AM  Result Value Ref Range Status   Specimen Description BLOOD RIGHT HAND  Final   Special Requests   Final    BOTTLES DRAWN AEROBIC AND ANAEROBIC AER ANA   Culture NO GROWTH 5 DAYS  Final   Report Status 01/28/2016 FINAL  Final  Urine culture     Status: None   Collection Time: 01/23/16  8:42 AM  Result Value Ref Range Status   Specimen Description URINE, CATHETERIZED  Final  Special Requests Normal  Final   Culture >=100,000 COLONIES/mL CANDIDA ALBICANS  Final   Report Status 01/28/2016 FINAL  Final  C difficile quick scan w PCR reflex     Status: None   Collection Time: 01/23/16  5:51 PM  Result Value Ref Range Status   C Diff antigen NEGATIVE NEGATIVE Final   C Diff toxin NEGATIVE NEGATIVE Final   C Diff interpretation Negative for C. difficile  Final  CULTURE, BLOOD (ROUTINE X 2) w Reflex to PCR ID Panel     Status: None (Preliminary result)   Collection Time: 01/30/16  5:59 PM  Result Value Ref Range Status   Specimen Description BLOOD RIGHT WRIST  Final   Special Requests   Final    BOTTLES DRAWN AEROBIC AND ANAEROBIC AERO 12CC ANA 10CC   Culture NO GROWTH 4 DAYS  Final   Report Status PENDING  Incomplete  CULTURE, BLOOD (ROUTINE X 2) w Reflex to PCR ID Panel     Status: None (Preliminary result)   Collection Time: 01/30/16  6:06 PM   Result Value Ref Range Status   Specimen Description BLOOD RIGHT ASSIST CONTROL  Final   Special Requests   Final    BOTTLES DRAWN AEROBIC AND ANAEROBIC  AERO 12CC ANA 10CC   Culture NO GROWTH 4 DAYS  Final   Report Status PENDING  Incomplete    Coagulation Studies: No results for input(s): LABPROT, INR in the last 72 hours.  Urinalysis: No results for input(s): COLORURINE, LABSPEC, PHURINE, GLUCOSEU, HGBUR, BILIRUBINUR, KETONESUR, PROTEINUR, UROBILINOGEN, NITRITE, LEUKOCYTESUR in the last 72 hours.  Invalid input(s): APPERANCEUR    Imaging: No results found.   Medications:     . acidophilus  1 capsule Oral Daily  . aspirin EC  81 mg Oral Daily  . calcium-vitamin D  1 tablet Oral Daily  . chlorhexidine  15 mL Mouth Rinse BID  . clopidogrel  75 mg Oral Daily  . docusate sodium  100 mg Oral BID  . epoetin (EPOGEN/PROCRIT) injection  10,000 Units Intravenous Q T,Th,Sa-HD  . feeding supplement (NEPRO CARB STEADY)  237 mL Oral BID BM  . [START ON 02/04/2016] fluconazole  100 mg Oral Q24H  . heparin subcutaneous  5,000 Units Subcutaneous Q12H  . insulin aspart  0-5 Units Subcutaneous QHS  . insulin aspart  0-9 Units Subcutaneous TID WC  . insulin glargine  6 Units Subcutaneous QHS  . levothyroxine  75 mcg Oral QAC breakfast  . nystatin   Topical BID  . omega-3 acid ethyl esters  1 g Oral Daily   And  . vitamin E  100 Units Oral Daily  . pantoprazole  40 mg Oral BID  . pregabalin  75 mg Oral BID  . rosuvastatin  20 mg Oral QHS  . sevelamer carbonate  800 mg Oral TID WC  . sodium chloride flush  3 mL Intravenous Q12H   acetaminophen **OR** acetaminophen, albuterol, alum & mag hydroxide-simeth, midodrine, naLOXone (NARCAN)  injection, nitroGLYCERIN, ondansetron **OR** ondansetron (ZOFRAN) IV, senna-docusate, temazepam  Assessment/ Plan:  45 y.o. 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  [facility] TTS  1. ESRD on HD TTHS: Patient completed dialysis Saturday. No acute indication for dialysis today. Next dialysis on Tuesday.  2. Anemia of CKD: Hemoglobin 8.7. Continue Epogen with dialysis.  3. Secondary Hyperparathyroidism:  Continue Renvela for phosphorous control.  PTH low at 60  - discontinued sensipar  4.  Hypotension: Continue midodrine.  LOS: 11 Lucillie Kiesel 3/27/20171:22 PM

## 2016-02-03 NOTE — Patient Outreach (Signed)
error 

## 2016-02-03 NOTE — Discharge Summary (Signed)
[facility] [place] at [facility]   PATIENT NAME: Taylor [name]    MR#:  [id number]  DATE OF BIRTH:  1971/10/22  DATE OF ADMISSION:  01/23/2016 ADMITTING PHYSICIAN: Adrian Saran, MD  DATE OF DISCHARGE: 02/03/2016  PRIMARY CARE PHYSICIAN: [name], MD    ADMISSION DIAGNOSIS:  End stage renal disease (HCC) [N18.6] Weakness [R53.1] UTI (lower urinary tract infection) [N39.0]  DISCHARGE DIAGNOSIS:  Active Problems:   Hypotension   Altered mental status   SECONDARY DIAGNOSIS:   Past Medical History  Diagnosis Date  . End stage renal disease on dialysis (HCC)     LUE fistula  . Type I diabetes mellitus (HCC)     a. 03/2014 admitted with HNK to [facility]. b. TTS  . Diabetic neuropathy (HCC)     severe, s/p multiple toe amputation  . Hypothyroidism   . Hypertension   . Hyperlipidemia   . H/O hiatal hernia   . GERD (gastroesophageal reflux disease)   . Anxiety   . Sebaceous cyst     side of neck  . Pneumonia     2010  . Anemia     a. req PRBC's 2011.  [name] PAD (peripheral artery disease) (HCC)     a. s/p amputation of toes on the right;  b. left LE claudication.  . Coronary artery disease     a. s/p MI;  b. 10/2009 CABG x 3 @ [facility]: LIMA->LAD, VG->OM3, VG->RPDA; c. 11/2010 Cath 3/3 patent grafts;  d 12/2012 Cath: LM 30d, LAD 85p, D1 70, D2 90, LCX 40ost, OM2 100, RCA 90p, 155m, L->LAD ok, VG->OM3 ok, VG->RPDA 30, EF 50%->Med Rx.  . Cataract     right  . Valvular disease     a. 11/2012 Echo: EF 55-60%, mild LVH, mild MR, mild bi-atrial enlargement, mild-mod TR, PASP ; b. echo 03/2014: EF 50-55%, nl WM, select images concerning for bicuspid aortic valve w/ nl thicknes of leaflets, mild MR, mild bi-atrial dilatation, RV mild dilatation - wall thickness nl, mod TR - select images appears to be mod to sev, PASP at least mod elevated.  . Diastolic CHF (HCC)     see echo above  . Depression   . Arthritis     rheumatoid arthritis   . Myocardial  infarction (HCC) 2010  . Pulmonary HTN (HCC)     a. continuation of 03/2014 echo PASP @ least mod elevated. RVSP 52 mm Hg. Parasternal long axis estimated @ 85 mm Hg  . Scrotal abscess     a. s/p multiple I&D  . Penile abscess     a. s/p multiple I&D  . Hematemesis     a. EGD 2016 with LA Grade C esophagitis, continued on Protonix   . Chronic respiratory failure (HCC)     a. on 3L oxygen via nasal cannula  . Anxiety   . Acute delirium   . Sepsis (HCC)   . 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 MYOCARDIAL INFARCTION, HX OF 01/26/2011    Qualifier: Diagnosis of  By: [name] MD, [name]    . HTN (hypertension) 02/11/2011  . Pulmonary hypertension associated with end stage renal disease on dialysis (HCC) 04/16/2014  . Hyperkalemia   . Hypothyroidism   . Osteomyelitis [facility])     HOSPITAL COURSE:   45 year old male with known history of hypertension, diabetes mellitus, end-stage renal disease on hemodialysis, history of C. difficile colitis in December 2016, peripheral vascular disease status post amputation of toes admitted to  the hospital secondary to hypotension noted after dialysis.  #1 Hypotension- hypovolemic, Received fluids. Improved - discontinued BP meds, also midodrine prior to dialysis  #2 diarrhea-Improved, no further diarrhea . Negative c.diff. - Continue probiotics while on antibiotics  #3 End-stage renal disease on hemodialysis-on Tuesday, Thursday and Saturday hemodialysis.  Anemia of chronic disease;transfuse one unit,prbc - hb stable, procrit with dialysis  #4 chronic diastolic congestive heart failure- well compensated Hold lasix due to hypotension, also metolazone held.  - #5 diabetes mellitus-continue Lantus- dose decreased due to persistent low sugars, sliding scale insulin with coverage.  . #6 UTI- has chronic suprapubic catheter - changed on admission - urine Cultures showed Candida. Patient is on Diflucan at this time.  #7 GERD- protonix  #8 Left thigh pain-  likely muscle spasms- ultrasound is negative for DVT.  #9. Lethargy-  likely secondary to metabolic encephalopathy adn also taking too many pain meds - received narcan, normal ABG and CT head - narcotics held and much improved mental status now Discontinued celexa and started on restoril at nights to help with insomnia Appreciate Psych consult  #10 .stage II sacral ulcer, stage I left lateral heel ulcer: Wound care consult.  DISCHARGE CONDITIONS:   Guarded  CONSULTS OBTAINED:  Treatment Team:  Mady Haagensen, MD Audery Amel, MD  DRUG ALLERGIES:   Allergies  Allergen Reactions  . Amoxicillin-Pot Clavulanate Nausea And Vomiting and Itching  . 2nd Skin Quick Heal Other (See Comments)  . Rifampin Nausea And Vomiting  . Tape Other (See Comments)    DISCHARGE MEDICATIONS:   Current Discharge Medication List    START taking these medications   Details  fluconazole (DIFLUCAN) 100 MG tablet Take 1 tablet (100 mg total) by mouth daily. X 5 days Qty: 5 tablet, Refills: 0    senna-docusate (SENOKOT-S) 8.6-50 MG tablet Take 1 tablet by mouth at bedtime as needed for mild constipation. Qty: 30 tablet, Refills: 0    temazepam (RESTORIL) 7.5 MG capsule Take 1 capsule (7.5 mg total) by mouth at bedtime as needed for sleep. Qty: 30 capsule, Refills: 0    traMADol (ULTRAM) 50 MG tablet Take 1 tablet (50 mg total) by mouth every 8 (eight) hours as needed. Qty: 30 tablet, Refills: 0      CONTINUE these medications which have CHANGED   Details  acetaminophen (TYLENOL) 325 MG tablet Take 2 tablets (650 mg total) by mouth every 6 (six) hours as needed for mild pain or headache. Qty: 30 tablet, Refills: 2    insulin aspart (NOVOLOG) 100 UNIT/ML injection Inject 0-6 Units into the skin 4 (four) times daily -  before meals and at bedtime. Per sliding scale: blood sugar 201-250 give 2 units, blood sugar 251-300 give 3 units, blood sugar 301-350 give 4 units, blood sugar 351-400 give 5  units, blood sugar 401-450 give 6 units. Qty: 10 mL, Refills: 11    insulin detemir (LEVEMIR) 100 UNIT/ML injection Inject 0.07 mLs (7 Units total) into the skin at bedtime. Qty: 10 mL, Refills: 11      CONTINUE these medications which have NOT CHANGED   Details  alum & mag hydroxide-simeth (MAALOX PLUS) 400-400-40 MG/5ML suspension Take 30 mLs by mouth every 4 (four) hours as needed for indigestion.     aspirin EC 81 MG tablet Take 81 mg by mouth daily.    calcium-vitamin D (OSCAL WITH D) 250-125 MG-UNIT tablet Take 1 tablet by mouth daily.    chlorhexidine (PERIDEX) 0.12 % solution 15 mLs  by Mouth Rinse route 2 (two) times daily. Qty: 120 mL, Refills: 0    cinacalcet (SENSIPAR) 30 MG tablet Take 30 mg by mouth daily.    clopidogrel (PLAVIX) 75 MG tablet Take 1 tablet (75 mg total) by mouth daily. Qty: 90 tablet, Refills: 3    DHA-EPA-VITAMIN E PO Take 1 capsule by mouth daily.    levothyroxine (SYNTHROID, LEVOTHROID) 75 MCG tablet Take 1 tablet (75 mcg total) by mouth daily before breakfast. Qty: 90 tablet, Refills: 1    midodrine (PROAMATINE) 10 MG tablet Take 1 tablet (10 mg total) by mouth daily as needed (Administer prior to dialysis). Qty: 30 tablet, Refills: 0    nitroGLYCERIN (NITROSTAT) 0.4 MG SL tablet Place 0.4 mg under the tongue every 5 (five) minutes as needed for chest pain.    Omega-3 Fatty Acids (FISH OIL) 1000 MG CAPS Take 1 capsule by mouth daily.    pantoprazole (PROTONIX) 40 MG tablet Take 40 mg by mouth 2 (two) times daily.    pregabalin (LYRICA) 75 MG capsule Take 75 mg by mouth 2 (two) times daily.     PROAIR HFA 108 (90 Base) MCG/ACT inhaler Inhale 2 puffs into the lungs every 6 (six) hours as needed.  Refills: 1    Probiotic CAPS Take 1 capsule by mouth daily. Qty: 10 capsule, Refills: 0    rosuvastatin (CRESTOR) 20 MG tablet Take 1 tablet (20 mg total) by mouth at bedtime. Qty: 30 tablet, Refills: 5    sevelamer carbonate (RENVELA) 800 MG  tablet Take 800 mg by mouth 3 (three) times daily with meals.       STOP taking these medications     amitriptyline (ELAVIL) 25 MG tablet      carvedilol (COREG) 25 MG tablet      citalopram (CELEXA) 20 MG tablet      docusate sodium (COLACE) 100 MG capsule      furosemide (LASIX) 80 MG tablet      HYDROcodone-acetaminophen (NORCO/VICODIN) 5-325 MG tablet      metolazone (ZAROXOLYN) 5 MG tablet      metronidazole (FLAGYL) 375 MG capsule      omeprazole (PRILOSEC) 40 MG capsule      traZODone (DESYREL) 50 MG tablet          DISCHARGE INSTRUCTIONS:   1. For dialysis tomorrow per schedule 2. PCP f/u in 1 week 3. Avoid narcotics if possible   If you experience worsening of your admission symptoms, develop shortness of breath, life threatening emergency, suicidal or homicidal thoughts you must seek medical attention immediately by calling 911 or calling your MD immediately  if symptoms less severe.  You Must read complete instructions/literature along with all the possible adverse reactions/side effects for all the Medicines you take and that have been prescribed to you. Take any new Medicines after you have completely understood and accept all the possible adverse reactions/side effects.   Please note  You were cared for by a hospitalist during your hospital stay. If you have any questions about your discharge medications or the care you received while you were in the hospital after you are discharged, you can call the unit and asked to speak with the hospitalist on call if the hospitalist that took care of you is not available. Once you are discharged, your primary care physician will handle any further medical issues. Please note that NO REFILLS for any discharge medications will be authorized once you are discharged, as it is imperative that you  return to your primary care physician (or establish a relationship with a primary care physician if you do not have one) for your  aftercare needs so that they can reassess your need for medications and monitor your lab values.    Today   CHIEF COMPLAINT:   Chief Complaint  Patient presents with  . Hypotension  . Weakness    VITAL SIGNS:  Blood pressure 126/72, pulse 94, temperature 97.6 F (36.4 C), temperature source Oral, resp. rate 18, height  (1.905 m), weight 83.8 kg (184 lb 11.9 oz), SpO2 100 %.  I/O:   Intake/Output Summary (Last 24 hours) at 02/03/16 1209 Last data filed at 02/03/16 1042  Gross per 24 hour  Intake    595 ml  Output     25 ml  Net    570 ml    PHYSICAL EXAMINATION:   Physical Exam  GENERAL: 45 y.o.-year-old patient lying in the bed with no acute distress. Chronically ill looking male. EYES: Pupils equal and reacting to light and accommodation. No scleral icterus. Extraocular muscles intact.  HEENT: Head atraumatic, normocephalic. Oropharynx and nasopharynx clear.  NECK: Supple, no jugular venous distention. No thyroid enlargement, no tenderness.  LUNGS: Normal breath sounds bilaterally, no wheezing, rales,rhonchi or crepitation. No use of accessory muscles of respiration. Decreased bibasilar breath sounds CARDIOVASCULAR: S1, S2 normal. No rubs, or gallops. Soft 3/6 systolic murmur is present ABDOMEN: Abdomen is distended but Soft, nontender. Bowel sounds present. No organomegaly or mass.  EXTREMITIES: No pedal edema, cyanosis, or clubbing. 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 NEUROLOGIC: cranial nerves II through XII intact. Has generalized weakness in the legs PSYCHIATRIC; awake and oriented. Complains of chronic pain.  SKIN' stage II sacral ulcer present.   DATA REVIEW:   CBC  Recent Labs Lab 02/01/16 1231  WBC 5.0  HGB 8.7*  HCT 27.0*  PLT 147*    Chemistries   Recent Labs Lab 02/01/16 1231  NA 130*  K 3.6  CL 93*  CO2 28  GLUCOSE 211*  BUN 27*  CREATININE 3.47*  CALCIUM 8.5*    Cardiac  Enzymes No results for input(s): TROPONINI in the last 168 hours.  Microbiology Results  Results for orders placed or performed during the hospital encounter of 01/23/16  Blood culture (routine x 2)     Status: None   Collection Time: 01/23/16  8:42 AM  Result Value Ref Range Status   Specimen Description BLOOD RIGHT AC  Final   Special Requests   Final    BOTTLES DRAWN AEROBIC AND ANAEROBIC AER ANA   Culture NO GROWTH 5 DAYS  Final   Report Status 01/28/2016 FINAL  Final  Blood culture (routine x 2)     Status: None   Collection Time: 01/23/16  8:42 AM  Result Value Ref Range Status   Specimen Description BLOOD RIGHT HAND  Final   Special Requests   Final    BOTTLES DRAWN AEROBIC AND ANAEROBIC AER ANA   Culture NO GROWTH 5 DAYS  Final   Report Status 01/28/2016 FINAL  Final  Urine culture     Status: None   Collection Time: 01/23/16  8:42 AM  Result Value Ref Range Status   Specimen Description URINE, CATHETERIZED  Final   Special Requests Normal  Final   Culture >=100,000 COLONIES/mL CANDIDA ALBICANS  Final   Report Status 01/28/2016 FINAL  Final  C difficile quick scan w PCR reflex  Status: None   Collection Time: 01/23/16  5:51 PM  Result Value Ref Range Status   C Diff antigen NEGATIVE NEGATIVE Final   C Diff toxin NEGATIVE NEGATIVE Final   C Diff interpretation Negative for C. difficile  Final  CULTURE, BLOOD (ROUTINE X 2) w Reflex to PCR ID Panel     Status: None (Preliminary result)   Collection Time: 01/30/16  5:59 PM  Result Value Ref Range Status   Specimen Description BLOOD RIGHT WRIST  Final   Special Requests   Final    BOTTLES DRAWN AEROBIC AND ANAEROBIC AERO 12CC ANA 10CC   Culture NO GROWTH 4 DAYS  Final   Report Status PENDING  Incomplete  CULTURE, BLOOD (ROUTINE X 2) w Reflex to PCR ID Panel     Status: None (Preliminary result)   Collection Time: 01/30/16  6:06 PM  Result Value Ref Range Status   Specimen Description BLOOD RIGHT  ASSIST CONTROL  Final   Special Requests   Final    BOTTLES DRAWN AEROBIC AND ANAEROBIC  AERO 12CC ANA 10CC   Culture NO GROWTH 4 DAYS  Final   Report Status PENDING  Incomplete    RADIOLOGY:  No results found.  EKG:   Orders placed or performed during the hospital encounter of 01/23/16  . ED EKG  . ED EKG  . EKG 12-Lead  . EKG 12-Lead  . EKG 12-Lead  . EKG 12-Lead      Management plans discussed with the patient, family and they are in agreement.  CODE STATUS:     Code Status Orders        Start     Ordered   01/23/16 1235  Do not attempt resuscitation (DNR)   Continuous    Question Answer Comment  In the event of cardiac or respiratory ARREST Do not call a "code blue"   In the event of cardiac or respiratory ARREST Do not perform Intubation, CPR, defibrillation or ACLS   In the event of cardiac or respiratory ARREST Use medication by any route, position, wound care, and other measures to relive pain and suffering. May use oxygen, suction and manual treatment of airway obstruction as needed for comfort.      01/23/16 1235    Code Status History    Date Active Date Inactive Code Status Order ID Comments User Context   01/07/2016 11:59 AM 01/17/2016 12:45 AM DNR [id number]  Enedina Finner, MD Inpatient   10/10/2015  3:27 PM 10/17/2015  8:50 PM Full Code [id number]  Katha Hamming, MD ED   05/27/2015  9:57 AM 05/27/2015  4:14 PM Full Code [id number]  [name], MD Inpatient   03/13/2015 11:35 PM 03/19/2015 10:13 PM Partial Code [id number]  Ramonita Lab, MD Inpatient   03/09/2015 11:45 PM 03/12/2015  4:43 PM Full Code [id number]  Areta Haber, RN Inpatient   02/02/2015  5:04 AM 02/11/2015 11:21 PM Full Code [id number]  Rhetta Mura, MD ED   12/10/2014  6:31 PM 12/21/2014  5:57 PM Full Code [id number]  Meredeth Ide, MD Inpatient   08/23/2014  2:13 AM 08/27/2014  4:30 PM Full Code [id number]  Yevonne Pax, MD Inpatient   07/24/2014  1:25 PM 07/25/2014  1:50 PM Full Code  [id number]  [name], MD Inpatient   01/04/2012  7:05 PM 01/10/2012  1:20 PM DNR [id number]  Elease Etienne, MD ED    Advance Directive Documentation        Most  Recent Value   Type of Advance Directive  Out of facility DNR (pink MOST or yellow form)   Pre-existing out of facility DNR order (yellow form or pink MOST form)     "MOST" Form in Place?        TOTAL TIME TAKING CARE OF THIS PATIENT: 37 minutes.    Enid Baas M.D on 02/03/2016 at 12:09 PM  Between 7am to 6pm - Pager - [phone]  After 6pm go to [email or web address] - password [email or web address] [facility]  [facility]  Office  [phone]  CC: Primary care physician; [name], MD

## 2016-02-03 NOTE — Discharge Instructions (Signed)
PLEASE GIVE FLUCONAZOLE AT NIGHTS

## 2016-02-03 NOTE — Progress Notes (Signed)
Kristen at [facility] took report. All questions answered. EMS will be notified and pt will transport via EMS to facility.

## 2016-02-04 LAB — CULTURE, BLOOD (ROUTINE X 2)
CULTURE: NO GROWTH
Culture: NO GROWTH

## 2016-02-05 ENCOUNTER — Telehealth: Payer: Self-pay | Admitting: *Deleted

## 2016-02-05 NOTE — Telephone Encounter (Signed)
Transitional care call attempted.  Patient was discharged from [facility] 3/27 to rehab at [facility].  Will follow up once discharged to home.

## 2016-02-10 ENCOUNTER — Ambulatory Visit: Payer: Medicare Other | Admitting: Family Medicine

## 2016-02-10 DIAGNOSIS — Z0289 Encounter for other administrative examinations: Secondary | ICD-10-CM

## 2016-02-11 ENCOUNTER — Telehealth: Payer: Self-pay | Admitting: Family Medicine

## 2016-02-11 NOTE — Telephone Encounter (Signed)
Patient did not come for their scheduled appointment4/3 for hospital follow up.  Please let me know if the patient needs to be contacted immediately for follow up or if no follow up is necessary.

## 2016-02-13 ENCOUNTER — Other Ambulatory Visit: Payer: Self-pay | Admitting: *Deleted

## 2016-02-13 NOTE — Patient Outreach (Signed)
[facility]) Care Management  [facility] Social Work  02/13/2016  Taylor [name] January 03, 1971 [id number]  Subjective:   Patient is a 45 year old male. Objective:   Encounter Medications:  Outpatient Encounter Prescriptions as of 02/13/2016  Medication Sig  . acetaminophen (TYLENOL) 325 MG tablet Take 2 tablets (650 mg total) by mouth every 6 (six) hours as needed for mild pain or headache.  [name] alum & mag hydroxide-simeth (MAALOX PLUS) 400-400-40 MG/5ML suspension Take 30 mLs by mouth every 4 (four) hours as needed for indigestion.   [name] aspirin EC 81 MG tablet Take 81 mg by mouth daily.  . calcium-vitamin D (OSCAL WITH D) 250-125 MG-UNIT tablet Take 1 tablet by mouth daily.  . chlorhexidine (PERIDEX) 0.12 % solution 15 mLs by Mouth Rinse route 2 (two) times daily.  . cinacalcet (SENSIPAR) 30 MG tablet Take 30 mg by mouth daily.  . clopidogrel (PLAVIX) 75 MG tablet Take 1 tablet (75 mg total) by mouth daily.  . DHA-EPA-VITAMIN E PO Take 1 capsule by mouth daily.  . fluconazole (DIFLUCAN) 100 MG tablet Take 1 tablet (100 mg total) by mouth daily. X 5 days  . insulin aspart (NOVOLOG) 100 UNIT/ML injection Inject 0-6 Units into the skin 4 (four) times daily -  before meals and at bedtime. Per sliding scale: blood sugar 201-250 give 2 units, blood sugar 251-300 give 3 units, blood sugar 301-350 give 4 units, blood sugar 351-400 give 5 units, blood sugar 401-450 give 6 units.  . insulin detemir (LEVEMIR) 100 UNIT/ML injection Inject 0.07 mLs (7 Units total) into the skin at bedtime.  [name] levothyroxine (SYNTHROID, LEVOTHROID) 75 MCG tablet Take 1 tablet (75 mcg total) by mouth daily before breakfast.  . midodrine (PROAMATINE) 10 MG tablet Take 1 tablet (10 mg total) by mouth daily as needed (Administer prior to dialysis).  . nitroGLYCERIN (NITROSTAT) 0.4 MG SL tablet Place 0.4 mg under the tongue every 5 (five) minutes as needed for chest pain.  . Omega-3 Fatty Acids (FISH OIL)  1000 MG CAPS Take 1 capsule by mouth daily.  . pantoprazole (PROTONIX) 40 MG tablet Take 40 mg by mouth 2 (two) times daily.  . pregabalin (LYRICA) 75 MG capsule Take 75 mg by mouth 2 (two) times daily.   [name] PROAIR HFA 108 (90 Base) MCG/ACT inhaler Inhale 2 puffs into the lungs every 6 (six) hours as needed.   . Probiotic CAPS Take 1 capsule by mouth daily.  . rosuvastatin (CRESTOR) 20 MG tablet Take 1 tablet (20 mg total) by mouth at bedtime.  . senna-docusate (SENOKOT-S) 8.6-50 MG tablet Take 1 tablet by mouth at bedtime as needed for mild constipation.  . sevelamer carbonate (RENVELA) 800 MG tablet Take 800 mg by mouth 3 (three) times daily with meals.   . temazepam (RESTORIL) 7.5 MG capsule Take 1 capsule (7.5 mg total) by mouth at bedtime as needed for sleep.  . traMADol (ULTRAM) 50 MG tablet Take 1 tablet (50 mg total) by mouth every 8 (eight) hours as needed.   No facility-administered encounter medications on file as of 02/13/2016.    Functional Status:  In your present state of health, do you have any difficulty performing the following activities: 01/23/2016 01/07/2016  Hearing? N N  Vision? N N  Difficulty concentrating or making decisions? N N  Walking or climbing stairs? Y Y  Dressing or bathing? Y Y  Doing errands, shopping? Malvin Johns  Preparing Food and eating ? - -  Using  the Toilet? - -  In the past six months, have you accidently leaked urine? - -  Do you have problems with loss of bowel control? - -  Managing your Medications? - -  Managing your Finances? - -  Housekeeping or managing your Housekeeping? - -    Fall/Depression Screening:  PHQ 2/9 Scores 11/20/2015 10/28/2015 04/09/2014  PHQ - 2 Score 1 1 0  PHQ- 9 Score - 4 -    Assessment:  This Child psychotherapist wen to visit patient at [facility], skilled nursing facility.  Patient had just returned from dialysis when this social worker arrived.  Patient was very drowsy and was not able to stay awake to complete the  assessment.  This social worker spoke to discharge planner Claretha Cooper, who stated that patient was slow to progress in therapy and would probably not discharge for another 4-6 weeks.  Per Gregary Signs, patient has been participating in physical and occupational therapy and will return to his parents home with home health services post discharge.   Phone call made to patient's father, Taylor [name], message left with his spouse for a return call.  Plan:  This Child psychotherapist will follow up with patient within the next two weeks to assess for discharge planning needs.

## 2016-02-25 ENCOUNTER — Other Ambulatory Visit: Payer: Self-pay | Admitting: *Deleted

## 2016-02-25 NOTE — Patient Outreach (Signed)
[facility]) Care Management  [facility] Social Work  02/25/2016  NASARIO CZERNIAK 1971/08/09 [id number]  Subjective:    Objective:   Encounter Medications:  Outpatient Encounter Prescriptions as of 02/25/2016  Medication Sig  . acetaminophen (TYLENOL) 325 MG tablet Take 2 tablets (650 mg total) by mouth every 6 (six) hours as needed for mild pain or headache.  [name] alum & mag hydroxide-simeth (MAALOX PLUS) 400-400-40 MG/5ML suspension Take 30 mLs by mouth every 4 (four) hours as needed for indigestion.   [name] aspirin EC 81 MG tablet Take 81 mg by mouth daily.  . calcium-vitamin D (OSCAL WITH D) 250-125 MG-UNIT tablet Take 1 tablet by mouth daily.  . chlorhexidine (PERIDEX) 0.12 % solution 15 mLs by Mouth Rinse route 2 (two) times daily.  . cinacalcet (SENSIPAR) 30 MG tablet Take 30 mg by mouth daily.  . clopidogrel (PLAVIX) 75 MG tablet Take 1 tablet (75 mg total) by mouth daily.  . DHA-EPA-VITAMIN E PO Take 1 capsule by mouth daily.  . fluconazole (DIFLUCAN) 100 MG tablet Take 1 tablet (100 mg total) by mouth daily. X 5 days  . insulin aspart (NOVOLOG) 100 UNIT/ML injection Inject 0-6 Units into the skin 4 (four) times daily -  before meals and at bedtime. Per sliding scale: blood sugar 201-250 give 2 units, blood sugar 251-300 give 3 units, blood sugar 301-350 give 4 units, blood sugar 351-400 give 5 units, blood sugar 401-450 give 6 units.  . insulin detemir (LEVEMIR) 100 UNIT/ML injection Inject 0.07 mLs (7 Units total) into the skin at bedtime.  [name] levothyroxine (SYNTHROID, LEVOTHROID) 75 MCG tablet Take 1 tablet (75 mcg total) by mouth daily before breakfast.  . midodrine (PROAMATINE) 10 MG tablet Take 1 tablet (10 mg total) by mouth daily as needed (Administer prior to dialysis).  . nitroGLYCERIN (NITROSTAT) 0.4 MG SL tablet Place 0.4 mg under the tongue every 5 (five) minutes as needed for chest pain.  . Omega-3 Fatty Acids (FISH OIL) 1000 MG CAPS Take 1 capsule  by mouth daily.  . pantoprazole (PROTONIX) 40 MG tablet Take 40 mg by mouth 2 (two) times daily.  . pregabalin (LYRICA) 75 MG capsule Take 75 mg by mouth 2 (two) times daily.   [name] PROAIR HFA 108 (90 Base) MCG/ACT inhaler Inhale 2 puffs into the lungs every 6 (six) hours as needed.   . Probiotic CAPS Take 1 capsule by mouth daily.  . rosuvastatin (CRESTOR) 20 MG tablet Take 1 tablet (20 mg total) by mouth at bedtime.  . senna-docusate (SENOKOT-S) 8.6-50 MG tablet Take 1 tablet by mouth at bedtime as needed for mild constipation.  . sevelamer carbonate (RENVELA) 800 MG tablet Take 800 mg by mouth 3 (three) times daily with meals.   . temazepam (RESTORIL) 7.5 MG capsule Take 1 capsule (7.5 mg total) by mouth at bedtime as needed for sleep.  . traMADol (ULTRAM) 50 MG tablet Take 1 tablet (50 mg total) by mouth every 8 (eight) hours as needed.   No facility-administered encounter medications on file as of 02/25/2016.    Functional Status:  In your present state of health, do you have any difficulty performing the following activities: 01/23/2016 01/07/2016  Hearing? N N  Vision? N N  Difficulty concentrating or making decisions? N N  Walking or climbing stairs? Y Y  Dressing or bathing? Y Y  Doing errands, shopping? Y Y  Preparing Food and eating ? - -  Using the Toilet? - -  In  the past six months, have you accidently leaked urine? - -  Do you have problems with loss of bowel control? - -  Managing your Medications? - -  Managing your Finances? - -  Housekeeping or managing your Housekeeping? - -    Fall/Depression Screening:  PHQ 2/9 Scores 11/20/2015 10/28/2015 04/09/2014  PHQ - 2 Score 1 1 0  PHQ- 9 Score - 4 -    Assessment: This Education officer, museum met patient at bedside at [facility].  Patient speech very slow, low tone.  He was very slow to respond to questions asked. Patient had to be prompted several times to answer questions asked.   Per patient he goes to dialysis 3 days a  week(Tuesday, Thursday and Saturday) [facility] provides the transportation.  Patient nodded when asked about his particpation in Occupational Therapy and Physical Therapy.  Patient pointed to his feet and slowly stated that , they are working towards him  being able to bear weight, however progress is slow.    This Education officer, museum spoke with discharge planner, Raquel Sarna who confirms that patient is receiving Occupational Therapy and Physical Therapy, however they have had to decrease the amount of time in therapy because he cannot tolerate long sessions Patient is making very slow gains.  No discharge date has been set due to lack of progress made.  Plan:  This social worker will continue to follow patient while in rehabilitation.              This social worker will speak with patient's family regarding discharge plan.   [facility] CM Care Plan Problem One        Most Recent Value   Care Plan Problem One  patient currentluy in skilled nursing facility   Role Documenting the Problem One  Clinical Social Worker   Care Plan for Problem One  Active   [facility] CM Short Term Goal #1 (0-30 days)  patient will actively participate in occupational and physical therapy within the next 30 days   [facility] CM Short Term Goal #1 Start Date  02/26/16   Interventions for Short Term Goal #1  This social worker encouraged patient to partiicpate in therapy to the best of his ability   [facility] CM Short Term Goal #2 (0-30 days)  This Education officer, museum and patient to collaborate with discharge planner to ensure a safe and stable discharge within the next 30 days   [facility] CM Short Term Goal #2 Start Date  02/19/16   Interventions for Short Term Goal #2  encouraged open communication with discharge planner regarding discharge plans.  Discussed patient's progress in treatment     Sheralyn Boatman [facility] Care Management [phone]

## 2016-02-26 ENCOUNTER — Encounter: Payer: Self-pay | Admitting: *Deleted

## 2016-03-09 ENCOUNTER — Other Ambulatory Visit: Payer: Self-pay | Admitting: *Deleted

## 2016-03-10 NOTE — Patient Outreach (Signed)
[facility]) Care Management  [facility] Social Work  03/10/2016  Taylor [name] 1971/01/12 [id number]  Subjective:    Objective:   Encounter Medications:  Outpatient Encounter Prescriptions as of 03/09/2016  Medication Sig  . acetaminophen (TYLENOL) 325 MG tablet Take 2 tablets (650 mg total) by mouth every 6 (six) hours as needed for mild pain or headache.  [name] alum & mag hydroxide-simeth (MAALOX PLUS) 400-400-40 MG/5ML suspension Take 30 mLs by mouth every 4 (four) hours as needed for indigestion.   [name] aspirin EC 81 MG tablet Take 81 mg by mouth daily.  . calcium-vitamin D (OSCAL WITH D) 250-125 MG-UNIT tablet Take 1 tablet by mouth daily.  . chlorhexidine (PERIDEX) 0.12 % solution 15 mLs by Mouth Rinse route 2 (two) times daily.  . cinacalcet (SENSIPAR) 30 MG tablet Take 30 mg by mouth daily.  . clopidogrel (PLAVIX) 75 MG tablet Take 1 tablet (75 mg total) by mouth daily.  . DHA-EPA-VITAMIN E PO Take 1 capsule by mouth daily.  . fluconazole (DIFLUCAN) 100 MG tablet Take 1 tablet (100 mg total) by mouth daily. X 5 days  . insulin aspart (NOVOLOG) 100 UNIT/ML injection Inject 0-6 Units into the skin 4 (four) times daily -  before meals and at bedtime. Per sliding scale: blood sugar 201-250 give 2 units, blood sugar 251-300 give 3 units, blood sugar 301-350 give 4 units, blood sugar 351-400 give 5 units, blood sugar 401-450 give 6 units.  . insulin detemir (LEVEMIR) 100 UNIT/ML injection Inject 0.07 mLs (7 Units total) into the skin at bedtime.  [name] levothyroxine (SYNTHROID, LEVOTHROID) 75 MCG tablet Take 1 tablet (75 mcg total) by mouth daily before breakfast.  . midodrine (PROAMATINE) 10 MG tablet Take 1 tablet (10 mg total) by mouth daily as needed (Administer prior to dialysis).  . nitroGLYCERIN (NITROSTAT) 0.4 MG SL tablet Place 0.4 mg under the tongue every 5 (five) minutes as needed for chest pain.  . Omega-3 Fatty Acids (FISH OIL) 1000 MG CAPS Take 1 capsule by  mouth daily.  . pantoprazole (PROTONIX) 40 MG tablet Take 40 mg by mouth 2 (two) times daily.  . pregabalin (LYRICA) 75 MG capsule Take 75 mg by mouth 2 (two) times daily.   [name] PROAIR HFA 108 (90 Base) MCG/ACT inhaler Inhale 2 puffs into the lungs every 6 (six) hours as needed.   . Probiotic CAPS Take 1 capsule by mouth daily.  . rosuvastatin (CRESTOR) 20 MG tablet Take 1 tablet (20 mg total) by mouth at bedtime.  . senna-docusate (SENOKOT-S) 8.6-50 MG tablet Take 1 tablet by mouth at bedtime as needed for mild constipation.  . sevelamer carbonate (RENVELA) 800 MG tablet Take 800 mg by mouth 3 (three) times daily with meals.   . temazepam (RESTORIL) 7.5 MG capsule Take 1 capsule (7.5 mg total) by mouth at bedtime as needed for sleep.  . traMADol (ULTRAM) 50 MG tablet Take 1 tablet (50 mg total) by mouth every 8 (eight) hours as needed.   No facility-administered encounter medications on file as of 03/09/2016.    Functional Status:  In your present state of health, do you have any difficulty performing the following activities: 01/23/2016 01/07/2016  Hearing? N N  Vision? N N  Difficulty concentrating or making decisions? N N  Walking or climbing stairs? Y Y  Dressing or bathing? Y Y  Doing errands, shopping? Y Y  Preparing Food and eating ? - -  Using the Toilet? - -  In  the past six months, have you accidently leaked urine? - -  Do you have problems with loss of bowel control? - -  Managing your Medications? - -  Managing your Finances? - -  Housekeeping or managing your Housekeeping? - -    Fall/Depression Screening:  PHQ 2/9 Scores 11/20/2015 10/28/2015 04/09/2014  PHQ - 2 Score 1 1 0  PHQ- 9 Score - 4 -    Assessment:  This Child psychotherapist visited patient at bedside at [facility].  Patient was much more alert than previous visit, however remained focused on getting cleaned up and dressed for physical therapy.  Per patient, he is unsure of discharge plan at this time and provided  very little information regarding progress made in rehabilitation.  Taylor [name], discharge planner was not available at the time of this social worker's visit.  Voicemail message left requesting a return call regarding patient's discharge plan and progress  Patient's father not at bed side as well, message left at his home for a return call as well to discuss current care and progress Plan:  This Child psychotherapist will follow up with discharge planner and patient's father in approximately 1 week regarding patient's progress in rehabilitation and anticipated discharge plan.    Taylor [name] [facility] Care Management [phone]

## 2016-03-11 ENCOUNTER — Other Ambulatory Visit: Payer: Self-pay | Admitting: *Deleted

## 2016-03-11 NOTE — Patient Outreach (Signed)
[facility]) Care Management  03/11/2016  Taylor [name] 10-30-1971 [id number]   This social worker met with discharge planner, Chucky Homes at [employer] [employer] regarding patient's progress in rehabilitation.  Per discharge planner, patient is beginning to make progress in rehabilitation. Patient much more alert and is able to participate in therapy.  There has been no discharge date set yet, however patient will be discharging home with his parents upon discharge.   Plan:  This Education officer, museum will continue to follow up with patient while he is in rehabilitation.    Sheralyn Boatman [facility] Care Management [phone]

## 2016-03-19 ENCOUNTER — Other Ambulatory Visit: Payer: Self-pay | Admitting: *Deleted

## 2016-03-19 NOTE — Patient Outreach (Signed)
[facility]) Care Management  03/19/2016  Taylor [name] 11-14-1970 [id number]   Phone call to discharge planner, Isidoro Donning at [facility] for status update on patient.  Patient making no progress with rehab plan of care.  He will be coming out of rehab  status at this time and will be under nursing due to multiple unstageable wounds.  Plan:  This social worker to continue to follow patient while at [facility].   Adriana Reams [facility] Care Management [phone]

## 2016-03-27 ENCOUNTER — Other Ambulatory Visit: Payer: Self-pay | Admitting: *Deleted

## 2016-03-27 NOTE — Patient Outreach (Signed)
[facility]) Care Management  03/27/2016  Taylor [name] Jun 06, 1971 [id number]   Follow up phone call to [facility] regarding patient's status in the Skilled Nursing Facility.  Per discharge planner, Donzetta Starch, patient will remain at [facility] for long term care.   Plan:  This Child psychotherapist will confirm with patient's father.             Case to be closed once confirmed.    Adriana Reams [facility] Care Management [phone]

## 2016-04-01 ENCOUNTER — Encounter: Payer: Self-pay | Admitting: *Deleted

## 2016-04-01 ENCOUNTER — Other Ambulatory Visit: Payer: Self-pay | Admitting: *Deleted

## 2016-04-01 NOTE — Patient Outreach (Signed)
[facility]) Care Management  04/01/2016  Taylor [name] 1971/06/07 [id number]   Phone call to patient's father to confirm that patient will remain at [facility] for long term care.  Voicemail message left for a return call.   Adriana Reams [facility] Care Management [phone]

## 2016-04-01 NOTE — Patient Outreach (Signed)
[facility]) Care Management  04/01/2016  Taylor [name] 29-Oct-1971 [id number]   Phone call to patient's father.  It was confirmed that patient will remain at [facility] under long term care. Patient's case to be closed at this time to [facility] care management.    Adriana Reams [facility] Care Management [phone]

## 2016-04-03 ENCOUNTER — Ambulatory Visit: Payer: Medicare Other | Admitting: Surgery

## 2016-04-04 ENCOUNTER — Inpatient Hospital Stay
Admission: EM | Admit: 2016-04-04 | Discharge: 2016-04-11 | DRG: 673 | Disposition: A | Discharge status: Skilled Nursing Facility | Payer: Medicare Other | Attending: Internal Medicine | Admitting: Internal Medicine

## 2016-04-04 ENCOUNTER — Emergency Department: Payer: Medicare Other

## 2016-04-04 DIAGNOSIS — Z936 Other artificial openings of urinary tract status: Secondary | ICD-10-CM | POA: Diagnosis not present

## 2016-04-04 DIAGNOSIS — Z955 Presence of coronary angioplasty implant and graft: Secondary | ICD-10-CM

## 2016-04-04 DIAGNOSIS — N390 Urinary tract infection, site not specified: Secondary | ICD-10-CM | POA: Diagnosis not present

## 2016-04-04 DIAGNOSIS — A4181 Sepsis due to Enterococcus: Secondary | ICD-10-CM | POA: Diagnosis present

## 2016-04-04 DIAGNOSIS — I252 Old myocardial infarction: Secondary | ICD-10-CM | POA: Diagnosis not present

## 2016-04-04 DIAGNOSIS — M069 Rheumatoid arthritis, unspecified: Secondary | ICD-10-CM | POA: Diagnosis present

## 2016-04-04 DIAGNOSIS — R319 Hematuria, unspecified: Secondary | ICD-10-CM | POA: Diagnosis present

## 2016-04-04 DIAGNOSIS — Y731 Therapeutic (nonsurgical) and rehabilitative gastroenterology and urology devices associated with adverse incidents: Secondary | ICD-10-CM | POA: Diagnosis present

## 2016-04-04 DIAGNOSIS — Z89422 Acquired absence of other left toe(s): Secondary | ICD-10-CM | POA: Diagnosis not present

## 2016-04-04 DIAGNOSIS — M4800 Spinal stenosis, site unspecified: Secondary | ICD-10-CM | POA: Diagnosis present

## 2016-04-04 DIAGNOSIS — Y92129 Unspecified place in nursing home as the place of occurrence of the external cause: Secondary | ICD-10-CM

## 2016-04-04 DIAGNOSIS — I2510 Atherosclerotic heart disease of native coronary artery without angina pectoris: Secondary | ICD-10-CM | POA: Diagnosis present

## 2016-04-04 DIAGNOSIS — Z833 Family history of diabetes mellitus: Secondary | ICD-10-CM | POA: Diagnosis not present

## 2016-04-04 DIAGNOSIS — L89153 Pressure ulcer of sacral region, stage 3: Secondary | ICD-10-CM | POA: Diagnosis present

## 2016-04-04 DIAGNOSIS — E039 Hypothyroidism, unspecified: Secondary | ICD-10-CM | POA: Diagnosis present

## 2016-04-04 DIAGNOSIS — Z6826 Body mass index (BMI) 26.0-26.9, adult: Secondary | ICD-10-CM

## 2016-04-04 DIAGNOSIS — Z8249 Family history of ischemic heart disease and other diseases of the circulatory system: Secondary | ICD-10-CM | POA: Diagnosis not present

## 2016-04-04 DIAGNOSIS — Z89421 Acquired absence of other right toe(s): Secondary | ICD-10-CM | POA: Diagnosis not present

## 2016-04-04 DIAGNOSIS — A4151 Sepsis due to Escherichia coli [E. coli]: Secondary | ICD-10-CM | POA: Diagnosis present

## 2016-04-04 DIAGNOSIS — Z888 Allergy status to other drugs, medicaments and biological substances status: Secondary | ICD-10-CM

## 2016-04-04 DIAGNOSIS — Z7982 Long term (current) use of aspirin: Secondary | ICD-10-CM

## 2016-04-04 DIAGNOSIS — N186 End stage renal disease: Secondary | ICD-10-CM | POA: Diagnosis present

## 2016-04-04 DIAGNOSIS — E876 Hypokalemia: Secondary | ICD-10-CM | POA: Diagnosis present

## 2016-04-04 DIAGNOSIS — E785 Hyperlipidemia, unspecified: Secondary | ICD-10-CM | POA: Diagnosis present

## 2016-04-04 DIAGNOSIS — J9610 Chronic respiratory failure, unspecified whether with hypoxia or hypercapnia: Secondary | ICD-10-CM | POA: Diagnosis present

## 2016-04-04 DIAGNOSIS — Z66 Do not resuscitate: Secondary | ICD-10-CM | POA: Diagnosis present

## 2016-04-04 DIAGNOSIS — Z79899 Other long term (current) drug therapy: Secondary | ICD-10-CM | POA: Diagnosis not present

## 2016-04-04 DIAGNOSIS — Z951 Presence of aortocoronary bypass graft: Secondary | ICD-10-CM

## 2016-04-04 DIAGNOSIS — A047 Enterocolitis due to Clostridium difficile: Secondary | ICD-10-CM | POA: Diagnosis present

## 2016-04-04 DIAGNOSIS — G8929 Other chronic pain: Secondary | ICD-10-CM | POA: Diagnosis present

## 2016-04-04 DIAGNOSIS — E1040 Type 1 diabetes mellitus with diabetic neuropathy, unspecified: Secondary | ICD-10-CM | POA: Diagnosis present

## 2016-04-04 DIAGNOSIS — Z880 Allergy status to penicillin: Secondary | ICD-10-CM | POA: Diagnosis not present

## 2016-04-04 DIAGNOSIS — A419 Sepsis, unspecified organism: Secondary | ICD-10-CM | POA: Diagnosis present

## 2016-04-04 DIAGNOSIS — K219 Gastro-esophageal reflux disease without esophagitis: Secondary | ICD-10-CM | POA: Diagnosis present

## 2016-04-04 DIAGNOSIS — Z87891 Personal history of nicotine dependence: Secondary | ICD-10-CM

## 2016-04-04 DIAGNOSIS — Z7902 Long term (current) use of antithrombotics/antiplatelets: Secondary | ICD-10-CM

## 2016-04-04 DIAGNOSIS — I5032 Chronic diastolic (congestive) heart failure: Secondary | ICD-10-CM | POA: Diagnosis present

## 2016-04-04 DIAGNOSIS — Z7401 Bed confinement status: Secondary | ICD-10-CM

## 2016-04-04 DIAGNOSIS — E440 Moderate protein-calorie malnutrition: Secondary | ICD-10-CM | POA: Diagnosis present

## 2016-04-04 DIAGNOSIS — R4182 Altered mental status, unspecified: Secondary | ICD-10-CM | POA: Diagnosis not present

## 2016-04-04 DIAGNOSIS — I132 Hypertensive heart and chronic kidney disease with heart failure and with stage 5 chronic kidney disease, or end stage renal disease: Secondary | ICD-10-CM | POA: Diagnosis present

## 2016-04-04 DIAGNOSIS — L89329 Pressure ulcer of left buttock, unspecified stage: Secondary | ICD-10-CM | POA: Diagnosis present

## 2016-04-04 DIAGNOSIS — Z87440 Personal history of urinary (tract) infections: Secondary | ICD-10-CM | POA: Diagnosis not present

## 2016-04-04 DIAGNOSIS — I739 Peripheral vascular disease, unspecified: Secondary | ICD-10-CM | POA: Diagnosis present

## 2016-04-04 DIAGNOSIS — E1022 Type 1 diabetes mellitus with diabetic chronic kidney disease: Secondary | ICD-10-CM | POA: Diagnosis present

## 2016-04-04 DIAGNOSIS — Z91041 Radiographic dye allergy status: Secondary | ICD-10-CM | POA: Diagnosis not present

## 2016-04-04 DIAGNOSIS — D631 Anemia in chronic kidney disease: Secondary | ICD-10-CM | POA: Diagnosis present

## 2016-04-04 DIAGNOSIS — E860 Dehydration: Secondary | ICD-10-CM | POA: Diagnosis present

## 2016-04-04 DIAGNOSIS — Z794 Long term (current) use of insulin: Secondary | ICD-10-CM

## 2016-04-04 DIAGNOSIS — T83511A Infection and inflammatory reaction due to indwelling urethral catheter, initial encounter: Principal | ICD-10-CM | POA: Diagnosis present

## 2016-04-04 DIAGNOSIS — N2581 Secondary hyperparathyroidism of renal origin: Secondary | ICD-10-CM | POA: Diagnosis present

## 2016-04-04 DIAGNOSIS — Z87898 Personal history of other specified conditions: Secondary | ICD-10-CM

## 2016-04-04 DIAGNOSIS — Z992 Dependence on renal dialysis: Secondary | ICD-10-CM | POA: Diagnosis not present

## 2016-04-04 DIAGNOSIS — B9620 Unspecified Escherichia coli [E. coli] as the cause of diseases classified elsewhere: Secondary | ICD-10-CM | POA: Diagnosis not present

## 2016-04-04 DIAGNOSIS — L8990 Pressure ulcer of unspecified site, unspecified stage: Secondary | ICD-10-CM | POA: Diagnosis present

## 2016-04-04 LAB — COMPREHENSIVE METABOLIC PANEL
ALBUMIN: 1.5 g/dL — AB (ref 3.5–5.0)
ALT: 8 U/L — ABNORMAL LOW (ref 17–63)
ANION GAP: 11 (ref 5–15)
AST: 16 U/L (ref 15–41)
Alkaline Phosphatase: 431 U/L — ABNORMAL HIGH (ref 38–126)
BILIRUBIN TOTAL: 1.4 mg/dL — AB (ref 0.3–1.2)
BUN: 15 mg/dL (ref 6–20)
CALCIUM: 7.8 mg/dL — AB (ref 8.9–10.3)
CO2: 30 mmol/L (ref 22–32)
Chloride: 93 mmol/L — ABNORMAL LOW (ref 101–111)
Creatinine, Ser: 1.93 mg/dL — ABNORMAL HIGH (ref 0.61–1.24)
GFR, EST AFRICAN AMERICAN: 46 mL/min — AB (ref >60)
GFR, EST NON AFRICAN AMERICAN: 40 mL/min — AB (ref >60)
Glucose, Bld: 195 mg/dL — ABNORMAL HIGH (ref 65–99)
POTASSIUM: 3.2 mmol/L — AB (ref 3.5–5.1)
Sodium: 134 mmol/L — ABNORMAL LOW (ref 135–145)
TOTAL PROTEIN: 5.6 g/dL — AB (ref 6.5–8.1)

## 2016-04-04 LAB — URINALYSIS COMPLETE WITH MICROSCOPIC (ARMC ONLY)
Bilirubin Urine: NEGATIVE
GLUCOSE, UA: NEGATIVE mg/dL
KETONES UR: NEGATIVE mg/dL
NITRITE: NEGATIVE
Protein, ur: 100 mg/dL — AB
SPECIFIC GRAVITY, URINE: 1.015 (ref 1.005–1.030)
pH: 5 (ref 5.0–8.0)

## 2016-04-04 LAB — CBC WITH DIFFERENTIAL/PLATELET
BASOS ABS: 0.1 10*3/uL (ref 0–0.1)
Eosinophils Absolute: 0 10*3/uL (ref 0–0.7)
Eosinophils Relative: 0 %
HEMATOCRIT: 25 % — AB (ref 40.0–52.0)
Hemoglobin: 7.6 g/dL — ABNORMAL LOW (ref 13.0–18.0)
Lymphs Abs: 0.3 10*3/uL — ABNORMAL LOW (ref 1.0–3.6)
MCH: 26.8 pg (ref 26.0–34.0)
MCHC: 30.3 g/dL — ABNORMAL LOW (ref 32.0–36.0)
MCV: 88.6 fL (ref 80.0–100.0)
Monocytes Absolute: 0.4 10*3/uL (ref 0.2–1.0)
Monocytes Relative: 2 %
NEUTROS ABS: 26.9 10*3/uL — AB (ref 1.4–6.5)
PLATELETS: 163 10*3/uL (ref 150–440)
RBC: 2.83 MIL/uL — AB (ref 4.40–5.90)
RDW: 18.3 % — ABNORMAL HIGH (ref 11.5–14.5)
WBC: 27.8 10*3/uL — AB (ref 3.8–10.6)

## 2016-04-04 LAB — PROCALCITONIN: Procalcitonin: 26.72 ng/mL

## 2016-04-04 LAB — BRAIN NATRIURETIC PEPTIDE: B Natriuretic Peptide: 1172 pg/mL — ABNORMAL HIGH (ref 0.0–100.0)

## 2016-04-04 LAB — TROPONIN I: TROPONIN I: 0.07 ng/mL — AB (ref <0.031)

## 2016-04-04 LAB — LACTIC ACID, PLASMA: Lactic Acid, Venous: 1.5 mmol/L (ref 0.5–2.0)

## 2016-04-04 LAB — GLUCOSE, CAPILLARY: Glucose-Capillary: 208 mg/dL — ABNORMAL HIGH (ref 65–99)

## 2016-04-04 MED ORDER — SODIUM CHLORIDE 0.9 % IV BOLUS (SEPSIS)
1000.0000 mL | Freq: Once | INTRAVENOUS | Status: AC
  Administered 2016-04-04: 1000 mL via INTRAVENOUS

## 2016-04-04 MED ORDER — TRAMADOL HCL 50 MG PO TABS
50.0000 mg | ORAL_TABLET | Freq: Three times a day (TID) | ORAL | Status: DC | PRN
  Administered 2016-04-08 – 2016-04-11 (×4): 50 mg via ORAL
  Filled 2016-04-04 (×4): qty 1

## 2016-04-04 MED ORDER — HEPARIN SODIUM (PORCINE) 5000 UNIT/ML IJ SOLN
5000.0000 [IU] | Freq: Three times a day (TID) | INTRAMUSCULAR | Status: DC
  Administered 2016-04-04 – 2016-04-11 (×19): 5000 [IU] via SUBCUTANEOUS
  Filled 2016-04-04 (×19): qty 1

## 2016-04-04 MED ORDER — VANCOMYCIN HCL IN DEXTROSE 1-5 GM/200ML-% IV SOLN
1000.0000 mg | Freq: Once | INTRAVENOUS | Status: AC
  Administered 2016-04-04: 1000 mg via INTRAVENOUS
  Filled 2016-04-04: qty 200

## 2016-04-04 MED ORDER — ACETAMINOPHEN 325 MG PO TABS: 650.0000 mg | ORAL_TABLET | Freq: Four times a day (QID) | ORAL | Status: DC | PRN

## 2016-04-04 MED ORDER — LIOTHYRONINE SODIUM 5 MCG PO TABS
10.0000 ug | ORAL_TABLET | Freq: Every day | ORAL | Status: DC
  Administered 2016-04-05 – 2016-04-09 (×5): 10 ug via ORAL
  Filled 2016-04-04 (×7): qty 2

## 2016-04-04 MED ORDER — LEVOTHYROXINE SODIUM 100 MCG PO TABS
100.0000 ug | ORAL_TABLET | Freq: Every day | ORAL | Status: DC
  Administered 2016-04-04 – 2016-04-10 (×7): 100 ug via ORAL
  Filled 2016-04-04 (×7): qty 1

## 2016-04-04 MED ORDER — PIPERACILLIN-TAZOBACTAM 3.375 G IVPB 30 MIN
3.3750 g | Freq: Once | INTRAVENOUS | Status: AC
  Administered 2016-04-04: 3.375 g via INTRAVENOUS
  Filled 2016-04-04: qty 50

## 2016-04-04 MED ORDER — ONDANSETRON HCL 4 MG/2ML IJ SOLN
4.0000 mg | Freq: Four times a day (QID) | INTRAMUSCULAR | Status: DC | PRN
  Administered 2016-04-10: 4 mg via INTRAVENOUS

## 2016-04-04 MED ORDER — CALCIUM CARBONATE-VITAMIN D 250-125 MG-UNIT PO TABS
1.0000 | ORAL_TABLET | Freq: Every day | ORAL | Status: DC
  Filled 2016-04-04: qty 1

## 2016-04-04 MED ORDER — CALCIUM CARBONATE-VITAMIN D 500-200 MG-UNIT PO TABS
1.0000 | ORAL_TABLET | Freq: Every day | ORAL | Status: DC
  Administered 2016-04-05 – 2016-04-11 (×6): 1 via ORAL
  Filled 2016-04-04 (×7): qty 1

## 2016-04-04 MED ORDER — CLOPIDOGREL BISULFATE 75 MG PO TABS
75.0000 mg | ORAL_TABLET | Freq: Every day | ORAL | Status: DC
Start: 2016-04-04 — End: 2016-04-06
  Administered 2016-04-05 – 2016-04-06 (×2): 75 mg via ORAL
  Filled 2016-04-04 (×2): qty 1

## 2016-04-04 MED ORDER — MORPHINE SULFATE (PF) 2 MG/ML IV SOLN
1.0000 mg | Freq: Four times a day (QID) | INTRAVENOUS | Status: DC | PRN
  Administered 2016-04-04 – 2016-04-08 (×3): 1 mg via INTRAVENOUS
  Filled 2016-04-04 (×3): qty 1

## 2016-04-04 MED ORDER — MIDODRINE HCL 5 MG PO TABS
10.0000 mg | ORAL_TABLET | Freq: Every day | ORAL | Status: DC | PRN
  Filled 2016-04-04: qty 2

## 2016-04-04 MED ORDER — ALBUTEROL SULFATE (2.5 MG/3ML) 0.083% IN NEBU: 2.5000 mg | INHALATION_SOLUTION | Freq: Four times a day (QID) | RESPIRATORY_TRACT | Status: DC | PRN

## 2016-04-04 MED ORDER — PANTOPRAZOLE SODIUM 40 MG PO TBEC
40.0000 mg | DELAYED_RELEASE_TABLET | Freq: Two times a day (BID) | ORAL | Status: DC
  Administered 2016-04-04 – 2016-04-10 (×12): 40 mg via ORAL
  Filled 2016-04-04 (×13): qty 1

## 2016-04-04 MED ORDER — CINACALCET HCL 30 MG PO TABS
30.0000 mg | ORAL_TABLET | Freq: Every day | ORAL | Status: DC
  Administered 2016-04-05 – 2016-04-06 (×2): 30 mg via ORAL
  Filled 2016-04-04 (×2): qty 1

## 2016-04-04 MED ORDER — OMEGA-3-ACID ETHYL ESTERS 1 G PO CAPS
1.0000 g | ORAL_CAPSULE | Freq: Every day | ORAL | Status: DC
  Administered 2016-04-05 – 2016-04-11 (×6): 1 g via ORAL
  Filled 2016-04-04 (×7): qty 1

## 2016-04-04 MED ORDER — PREGABALIN 75 MG PO CAPS
75.0000 mg | ORAL_CAPSULE | Freq: Two times a day (BID) | ORAL | Status: DC
  Administered 2016-04-04 – 2016-04-10 (×12): 75 mg via ORAL
  Filled 2016-04-04 (×13): qty 1

## 2016-04-04 MED ORDER — VANCOMYCIN HCL IN DEXTROSE 750-5 MG/150ML-% IV SOLN
750.0000 mg | Freq: Once | INTRAVENOUS | Status: AC
  Administered 2016-04-04: 750 mg via INTRAVENOUS
  Filled 2016-04-04: qty 150

## 2016-04-04 MED ORDER — ONDANSETRON HCL 4 MG PO TABS: 4.0000 mg | ORAL_TABLET | Freq: Four times a day (QID) | ORAL | Status: DC | PRN

## 2016-04-04 MED ORDER — ASPIRIN EC 81 MG PO TBEC
81.0000 mg | DELAYED_RELEASE_TABLET | Freq: Every day | ORAL | Status: DC
  Administered 2016-04-05 – 2016-04-06 (×2): 81 mg via ORAL
  Filled 2016-04-04 (×2): qty 1

## 2016-04-04 MED ORDER — ALBUTEROL SULFATE HFA 108 (90 BASE) MCG/ACT IN AERS: 2.0000 | INHALATION_SPRAY | Freq: Four times a day (QID) | RESPIRATORY_TRACT | Status: DC | PRN

## 2016-04-04 MED ORDER — PIPERACILLIN-TAZOBACTAM 3.375 G IVPB
3.3750 g | Freq: Two times a day (BID) | INTRAVENOUS | Status: DC
  Administered 2016-04-05: 3.375 g via INTRAVENOUS
  Filled 2016-04-04 (×2): qty 50

## 2016-04-04 MED ORDER — ROSUVASTATIN CALCIUM 10 MG PO TABS
10.0000 mg | ORAL_TABLET | Freq: Every day | ORAL | Status: DC
  Administered 2016-04-04 – 2016-04-10 (×7): 10 mg via ORAL
  Filled 2016-04-04 (×7): qty 1

## 2016-04-04 MED ORDER — ACETAMINOPHEN 650 MG RE SUPP: 650.0000 mg | Freq: Four times a day (QID) | RECTAL | Status: DC | PRN

## 2016-04-04 MED ORDER — POTASSIUM CHLORIDE IN NACL 20-0.9 MEQ/L-% IV SOLN
INTRAVENOUS | Status: DC
  Administered 2016-04-04: 23:00:00 via INTRAVENOUS
  Administered 2016-04-05: 100 mL/h via INTRAVENOUS
  Filled 2016-04-04 (×3): qty 1000

## 2016-04-04 NOTE — ED Notes (Signed)
Pt came to ED via EMS from [facility]. Staff reports pt was altered this morning. Pt is bed bound. On contact precautions for Cdiff. Normally on 4 L [place]. Staff reports pt was at 86% on oxygen. Per [facility], pt was 100% on 4L [place]. Pt was at dialysis today.
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
## 2016-04-04 NOTE — Progress Notes (Addendum)
Pharmacy Antibiotic Note  Taylor [name] is a 45 y.o. male admitted on 04/04/2016 with sepsis.  Pharmacy has been consulted for Vancomycin and Zosyn  Dosing. Patient is ESRD requiring Hemodialysis; however unclear patient's HD schedule. Nephrology has been consulted.   Plan: Patient received Vancomycin 1 g IV x 1. Will give an additional 750 mg to =1750 mg load.   Will need to follow up on HD schedule while in house.  Zosyn: Patient has Augmentin listed as a drug allergy; however Patient received Zosyn 3.375 g IV x 1 in the ED without any documentation of an allergic reaction. Will start Zosyn 3.375 g IV q12 hours @ 05:00 on 5/28.   Height: 5\' 10"  (177.8 cm) Weight: 185 lb 13.6 oz (84.3 kg) IBW/kg (Calculated) : 73  Temp (24hrs), Avg:99.6 F (37.6 C), Min:98.5 F (36.9 C), Max:100.7 F (38.2 C)   Recent Labs Lab 04/04/16 1716 04/04/16 1719  WBC  --  27.8*  CREATININE  --  1.93*  LATICACIDVEN 1.5  --     Estimated Creatinine Clearance: 48.9 mL/min (by C-G formula based on Cr of 1.93).    Allergies  Allergen Reactions  . Amoxicillin-Pot Clavulanate Nausea And Vomiting and Itching  . Contrast Media [Iodinated Diagnostic Agents] Other (See Comments)    Unknown reaction  . 2nd Skin Quick Heal Other (See Comments)  . Rifampin Nausea And Vomiting  . Tape Other (See Comments)    Antimicrobials this admission:   >>    >>   Dose adjustments this admission:   Microbiology results:  BCx: pending   UCx:  Pending.  Sputum:    MRSA PCR:   Thank you for allowing pharmacy to be a part of this patient's care.  Elliona Doddridge D 04/04/2016 8:52 PM

## 2016-04-04 NOTE — H&P (Signed)
[facility] - [place] at [facility]   PATIENT NAME: Taylor [name]    MR#:  [id number]  DATE OF BIRTH:  06/02/1971  DATE OF ADMISSION:  04/04/2016  PRIMARY CARE PHYSICIAN: [name], MD   REQUESTING/REFERRING PHYSICIAN: Dr Cyril Loosen  CHIEF COMPLAINT:  Altered mental status  HISTORY OF PRESENT ILLNESS:  Taylor [name]  is a 45 y.o. male with a known history ofEnd-stage renal disease on hemodialysis, diabetes with severe diabetic neuropathy and multiple toe amputation, severe spinal stenosis with chronic back pain, hypertension, hypothyroidism, [facility] after he was found to have altered mental status and urine looking very thick and turbinate. Patient has a suprapubic catheter for a long time and was recently admitted discharge in March 2017 with a urine grew  Candida. Patient was discharged to finish a course of Diflucan.  His white count is 27,000. Patient is being admitted with sepsis source appears to be urine. Received IV Vanco and Zosyn in the emergency room in the office  PAST MEDICAL HISTORY:   Past Medical History  Diagnosis Date  . End stage renal disease on dialysis (HCC)     LUE fistula  . Type I diabetes mellitus (HCC)     a. 03/2014 admitted with HNK to [facility]. b. TTS  . Diabetic neuropathy (HCC)     severe, s/p multiple toe amputation  . Hypothyroidism   . Hypertension   . Hyperlipidemia   . H/O hiatal hernia   . GERD (gastroesophageal reflux disease)   . Anxiety   . Sebaceous cyst     side of neck  . Pneumonia     2010  . Anemia     a. req PRBC's 2011.  [name] PAD (peripheral artery disease) (HCC)     a. s/p amputation of toes on the right;  b. left LE claudication.  . Coronary artery disease     a. s/p MI;  b. 10/2009 CABG x 3 @ [facility]: LIMA->LAD, VG->OM3, VG->RPDA; c. 11/2010 Cath 3/3 patent grafts;  d 12/2012 Cath: LM 30d, LAD 85p, D1 70, D2 90, LCX 40ost, OM2 100, RCA 90p, 159m, L->LAD ok,  VG->OM3 ok, VG->RPDA 30, EF 50%->Med Rx.  . Cataract     right  . Valvular disease     a. 11/2012 Echo: EF 55-60%, mild LVH, mild MR, mild bi-atrial enlargement, mild-mod TR, PASP ; b. echo 03/2014: EF 50-55%, nl WM, select images concerning for bicuspid aortic valve w/ nl thicknes of leaflets, mild MR, mild bi-atrial dilatation, RV mild dilatation - wall thickness nl, mod TR - select images appears to be mod to sev, PASP at least mod elevated.  . Diastolic CHF (HCC)     see echo above  . Depression   . Arthritis     rheumatoid arthritis   . Myocardial infarction (HCC) 2010  . Pulmonary HTN (HCC)     a. continuation of 03/2014 echo PASP @ least mod elevated. RVSP 52 mm Hg. Parasternal long axis estimated @ 85 mm Hg  . Scrotal abscess     a. s/p multiple I&D  . Penile abscess     a. s/p multiple I&D  . Hematemesis     a. EGD 2016 with LA Grade C esophagitis, continued on Protonix   . Chronic respiratory failure (HCC)     a. on 3L oxygen via nasal cannula  . Anxiety   . Acute delirium   . Sepsis (HCC)   . Urethrocutaneous  fistula in male   . MYOCARDIAL INFARCTION, HX OF 01/26/2011    Qualifier: Diagnosis of  By: [name] MD, [name]    . HTN (hypertension) 02/11/2011  . Pulmonary hypertension associated with end stage renal disease on dialysis (HCC) 04/16/2014  . Hyperkalemia   . Hypothyroidism   . Osteomyelitis (HCC)     PAST SURGICAL HISTOIRY:   Past Surgical History  Procedure Laterality Date  . Dialysis fistula creation      left upper arm fistula  . Amputation      TOES ON BOTH FEET  . Abscess drainage  Behind right ear/occipital scalp  . Skin graft    . Eye surgery  1999  . Coronary artery bypass graft  10/2009    [facility] (Dr. Katrinka Blazing)  . Foot amputation    . Cardiac catheterization    . Cardiac catheterization  2/14    [facility]: severe 3 vessel CAD with patent grafts, RHC: moderately elevated PCW and pulmonary hypertension  . Penile prosthesis implant N/A 07/24/2014     Procedure: SALINE PENILE INJECTION WITH DISSECTION OF CORPORA ,  IMPLANTATION OF COLOPLAST PENILE PROTHESIS INFLATABLE;  Surgeon: [name], MD;  Location: [facility] ORS;  Service: Urology;  Laterality: N/A;  . Removal of penile prosthesis N/A 08/22/2014    Procedure: EXPLANT OF INFECTED  PENILE PROSTHESIS;  Surgeon: [name], MD;  Location: [facility] ORS;  Service: Urology;  Laterality: N/A;  . Irrigation and debridement abscess Bilateral 12/14/2014    Procedure: Bilateral Corporal Irrigation with Cultures and Drainage, Penrose Drain Insertion;  Surgeon: [name], MD;  Location: [facility] OR;  Service: Urology;  Laterality: Bilateral;  . Scrotal exploration N/A 02/03/2015    Procedure: IRRIGATION AND DEBRIDEMENT SCROTAL/PENILE ABSCESS;  Surgeon: [name], MD;  Location: [facility] OR;  Service: Urology;  Laterality: N/A;  . Cystoscopy N/A 02/03/2015    Procedure: CYSTOSCOPY;  Surgeon: [name], MD;  Location: [facility] OR;  Service: Urology;  Laterality: N/A;  . Esophagogastroduodenoscopy N/A 03/18/2015    Procedure: ESOPHAGOGASTRODUODENOSCOPY (EGD);  Surgeon: [name], MD;  Location: [facility] ENDOSCOPY;  Service: Endoscopy;  Laterality: N/A;  . Cardiac catheterization N/A 05/27/2015    Procedure: Right Heart Cath;  Surgeon: [name], MD;  Location: [facility] INVASIVE CV LAB;  Service: Cardiovascular;  Laterality: N/A;    SOCIAL HISTORY:   Social History  Substance Use Topics  . Smoking status: Never Smoker   . Smokeless tobacco: Former Neurosurgeon    Types: Chew    Quit date: 08/23/1995  . Alcohol Use: No     Comment: occasional beer    FAMILY HISTORY:   Family History  Problem Relation Age of Onset  . Heart disease Other   . Hypertension Other   . Hypertension Mother   . Heart disease Mother   . Diabetes Mother   . Birth defects Paternal Uncle     unaware  . Birth defects Paternal Grandmother     breast  . Kidney disease Neg Hx   . Prostate cancer Neg Hx     DRUG  ALLERGIES:   Allergies  Allergen Reactions  . Amoxicillin-Pot Clavulanate Nausea And Vomiting and Itching  . Contrast Media [Iodinated Diagnostic Agents] Other (See Comments)    Unknown reaction  . 2nd Skin Quick Heal Other (See Comments)  . Rifampin Nausea And Vomiting  . Tape Other (See Comments)    REVIEW OF SYSTEMS:  Review of Systems  Unable to perform ROS: mental acuity  MEDICATIONS AT HOME:   Prior to Admission medications   Medication Sig Start Date End Date Taking? Authorizing Provider  acetaminophen (TYLENOL) 325 MG tablet Take 2 tablets (650 mg total) by mouth every 6 (six) hours as needed for mild pain or headache. 02/03/16   Enid Baas, MD  alum & mag hydroxide-simeth (MAALOX PLUS) 400-400-40 MG/5ML suspension Take 30 mLs by mouth every 4 (four) hours as needed for indigestion.     Historical Provider, MD  aspirin EC 81 MG tablet Take 81 mg by mouth daily.    Historical Provider, MD  calcium-vitamin D (OSCAL WITH D) 250-125 MG-UNIT tablet Take 1 tablet by mouth daily.    Historical Provider, MD  chlorhexidine (PERIDEX) 0.12 % solution 15 mLs by Mouth Rinse route 2 (two) times daily. 01/16/16   Katharina Caper, MD  cinacalcet (SENSIPAR) 30 MG tablet Take 30 mg by mouth daily.    Historical Provider, MD  clopidogrel (PLAVIX) 75 MG tablet Take 1 tablet (75 mg total) by mouth daily. 09/11/15   Dianne Dun, MD  DHA-EPA-VITAMIN E PO Take 1 capsule by mouth daily.    Historical Provider, MD  fluconazole (DIFLUCAN) 100 MG tablet Take 1 tablet (100 mg total) by mouth daily. X 5 days 02/04/16   Enid Baas, MD  insulin aspart (NOVOLOG) 100 UNIT/ML injection Inject 0-6 Units into the skin 4 (four) times daily -  before meals and at bedtime. Per sliding scale: blood sugar 201-250 give 2 units, blood sugar 251-300 give 3 units, blood sugar 301-350 give 4 units, blood sugar 351-400 give 5 units, blood sugar 401-450 give 6 units. 02/03/16   Enid Baas, MD  insulin  detemir (LEVEMIR) 100 UNIT/ML injection Inject 0.07 mLs (7 Units total) into the skin at bedtime. 02/03/16   Enid Baas, MD  levothyroxine (SYNTHROID, LEVOTHROID) 75 MCG tablet Take 1 tablet (75 mcg total) by mouth daily before breakfast. 07/26/13   Dianne Dun, MD  midodrine (PROAMATINE) 10 MG tablet Take 1 tablet (10 mg total) by mouth daily as needed (Administer prior to dialysis). 01/16/16   Katharina Caper, MD  nitroGLYCERIN (NITROSTAT) 0.4 MG SL tablet Place 0.4 mg under the tongue every 5 (five) minutes as needed for chest pain.    Historical Provider, MD  Omega-3 Fatty Acids (FISH OIL) 1000 MG CAPS Take 1 capsule by mouth daily.    Historical Provider, MD  pantoprazole (PROTONIX) 40 MG tablet Take 40 mg by mouth 2 (two) times daily.    Historical Provider, MD  pregabalin (LYRICA) 75 MG capsule Take 75 mg by mouth 2 (two) times daily.     Historical Provider, MD  PROAIR HFA 108 [phone] Base) MCG/ACT inhaler Inhale 2 puffs into the lungs every 6 (six) hours as needed.  10/08/15   Historical Provider, MD  Probiotic CAPS Take 1 capsule by mouth daily. 10/15/15   Enid Baas, MD  rosuvastatin (CRESTOR) 20 MG tablet Take 1 tablet (20 mg total) by mouth at bedtime. 12/13/12   Dianne Dun, MD  senna-docusate (SENOKOT-S) 8.6-50 MG tablet Take 1 tablet by mouth at bedtime as needed for mild constipation. 02/03/16   Enid Baas, MD  sevelamer carbonate (RENVELA) 800 MG tablet Take 800 mg by mouth 3 (three) times daily with meals.     Historical Provider, MD  temazepam (RESTORIL) 7.5 MG capsule Take 1 capsule (7.5 mg total) by mouth at bedtime as needed for sleep. 02/03/16   Enid Baas, MD  traMADol (ULTRAM) 50 MG tablet  Take 1 tablet (50 mg total) by mouth every 8 (eight) hours as needed. 02/03/16   Enid Baas, MD      VITAL SIGNS:  Blood pressure 101/66, pulse 107, temperature 100.7 F (38.2 C), temperature source Rectal, resp. rate 14, height  (1.778 m), weight 84.3 kg (185  lb 13.6 oz), SpO2 100 %.  PHYSICAL EXAMINATION:  GENERAL:  45 y.o.-year-old patient lying in the bed with Moderate  acute distress. appears chronically and critically ill  EYES: Pupils equal, round, reactive to light and accommodation. No scleral icterus. Extraocular muscles intact.  HEENT: Head atraumatic, normocephalic. Oropharynx and nasopharynx clear. Dry oral mucosa  NECK:  Supple, no jugular venous distention. No thyroid enlargement, no tenderness.  LUNGS: Normal breath sounds bilaterally, no wheezing, rales,rhonchi or crepitation. No use of accessory muscles of respiration.  CARDIOVASCULAR: S1, S2 normal. No murmurs, rubs, or gallops. The tachycardia  ABDOMEN: Soft, nontender, nondistended. Bowel sounds present. No organomegaly or mass.  EXTREMITIES: + pedal edema, cyanosis, or clubbing.  NEUROLOGIC:Unable to test. Patient has altered mental status Psychiatry C: The patient is alert, but confused  SKIN: No obvious rash, lesion,decubitus ulcer.   LABORATORY PANEL:   CBC  Recent Labs Lab 04/04/16 1719  WBC 27.8*  HGB 7.6*  HCT 25.0*  PLT 163   ------------------------------------------------------------------------------------------------------------------  Chemistries   Recent Labs Lab 04/04/16 1719  NA 134*  K 3.2*  CL 93*  CO2 30  GLUCOSE 195*  BUN 15  CREATININE 1.93*  CALCIUM 7.8*  AST 16  ALT 8*  ALKPHOS 431*  BILITOT 1.4*   ------------------------------------------------------------------------------------------------------------------  Cardiac Enzymes  Recent Labs Lab 04/04/16 1719  TROPONINI 0.07*   ------------------------------------------------------------------------------------------------------------------  RADIOLOGY:  Dg Chest Port 1 View  04/04/2016  CLINICAL DATA:  Altered mental status EXAM: PORTABLE CHEST 1 VIEW COMPARISON:  None. FINDINGS: Cardiac shadow is at the upper limits of normal in size. Postsurgical changes are again  seen. Mild vascular congestion is noted with mild interstitial edema. No focal infiltrate or sizable effusion is noted. IMPRESSION: Mild CHF. Electronically Signed   By: Alcide Clever M.D.   On: 04/04/2016 17:44    EKG:    IMPRESSION AND PLAN:  Taylor [name]  is a 45 y.o. male with a known history ofEnd-stage renal disease on hemodialysis, diabetes with severe diabetic neuropathy and multiple toe amputation, severe spinal stenosis with chronic back pain, hypertension, hypothyroidism, comes [facility] after he was found to have altered mental status and urine looking very thick and turbinate. Patient has a suprapubic catheter for a long time and was recently admitted discharge in March 2017 with a urine grew  Candida. Patient was discharged to finish a course of Diflucan.   1. Sepsis suspect source urine which looks turbid and abnormal urinalysis -Admitted to medical floor -IV Vanco and Zosyn -Follow-up blood cultures and urine culture  2 leukocytosis -Follow-up WBC count  3. Suprapubic catheter chronic likely source of UTI recurrent -Patient would benefit from changing suprapubic catheter while he is in the hospital -His last admission and urine culture grew Candida he was treated with Diflucan  4. Chronic back pain due to severe spinal stenosis Continue pain meds  5. Hypokalemia and clinical dehydration repeat with IV fluids  6. ESRD on hemodialysis nephrology to see patient.  Patient has overall poor prognosis. Status post discussed with patient's brother. Patient is a DO NOT RESUSCITATE CODE STATUS was confirmed with patient's broth  All the records are reviewed  and case discussed with ED provider. Management plans discussed with the patient, family and they are in agreement.  CODE STATUS: DNR  TOTAL critical TIME TAKING CARE OF THIS PATIENT: .    Taylor [name] M.D on 04/04/2016 at 7:17 PM  Between 7am to 6pm - Pager -  [phone]  After 6pm go to [email or web address] - password [email or web address] [facility]  [place] [facility]  Office  [phone]  CC: Primary care physician; [name], MD

## 2016-04-05 DIAGNOSIS — Z936 Other artificial openings of urinary tract status: Secondary | ICD-10-CM

## 2016-04-05 DIAGNOSIS — A4151 Sepsis due to Escherichia coli [E. coli]: Secondary | ICD-10-CM

## 2016-04-05 DIAGNOSIS — R4182 Altered mental status, unspecified: Secondary | ICD-10-CM

## 2016-04-05 DIAGNOSIS — B9620 Unspecified Escherichia coli [E. coli] as the cause of diseases classified elsewhere: Secondary | ICD-10-CM

## 2016-04-05 DIAGNOSIS — N390 Urinary tract infection, site not specified: Secondary | ICD-10-CM

## 2016-04-05 LAB — BASIC METABOLIC PANEL
ANION GAP: 12 (ref 5–15)
BUN: 18 mg/dL (ref 6–20)
CHLORIDE: 93 mmol/L — AB (ref 101–111)
CO2: 25 mmol/L (ref 22–32)
Calcium: 7.4 mg/dL — ABNORMAL LOW (ref 8.9–10.3)
Creatinine, Ser: 2.18 mg/dL — ABNORMAL HIGH (ref 0.61–1.24)
GFR calc non Af Amer: 34 mL/min — ABNORMAL LOW (ref >60)
GFR, EST AFRICAN AMERICAN: 40 mL/min — AB (ref >60)
GLUCOSE: 268 mg/dL — AB (ref 65–99)
POTASSIUM: 3.4 mmol/L — AB (ref 3.5–5.1)
Sodium: 130 mmol/L — ABNORMAL LOW (ref 135–145)

## 2016-04-05 LAB — C DIFFICILE QUICK SCREEN W PCR REFLEX
C DIFFICILE (CDIFF) TOXIN: NEGATIVE
C DIFFICLE (CDIFF) ANTIGEN: POSITIVE — AB

## 2016-04-05 LAB — BLOOD CULTURE ID PANEL (REFLEXED)
Acinetobacter baumannii: NOT DETECTED
CANDIDA GLABRATA: NOT DETECTED
CANDIDA KRUSEI: NOT DETECTED
CANDIDA TROPICALIS: NOT DETECTED
Candida albicans: NOT DETECTED
Candida parapsilosis: NOT DETECTED
Carbapenem resistance: NOT DETECTED
ESCHERICHIA COLI: DETECTED — AB
Enterobacter cloacae complex: NOT DETECTED
Enterobacteriaceae species: DETECTED — AB
Enterococcus species: DETECTED — AB
Haemophilus influenzae: NOT DETECTED
KLEBSIELLA OXYTOCA: NOT DETECTED
KLEBSIELLA PNEUMONIAE: NOT DETECTED
LISTERIA MONOCYTOGENES: NOT DETECTED
Methicillin resistance: NOT DETECTED
NEISSERIA MENINGITIDIS: NOT DETECTED
PROTEUS SPECIES: NOT DETECTED
Pseudomonas aeruginosa: NOT DETECTED
SERRATIA MARCESCENS: NOT DETECTED
STREPTOCOCCUS SPECIES: NOT DETECTED
Staphylococcus aureus (BCID): NOT DETECTED
Staphylococcus species: NOT DETECTED
Streptococcus agalactiae: NOT DETECTED
Streptococcus pneumoniae: NOT DETECTED
Streptococcus pyogenes: NOT DETECTED
Vancomycin resistance: NOT DETECTED

## 2016-04-05 LAB — GLUCOSE, CAPILLARY
GLUCOSE-CAPILLARY: 244 mg/dL — AB (ref 65–99)
GLUCOSE-CAPILLARY: 281 mg/dL — AB (ref 65–99)
GLUCOSE-CAPILLARY: 214 mg/dL — AB (ref 65–99)
Glucose-Capillary: 267 mg/dL — ABNORMAL HIGH (ref 65–99)

## 2016-04-05 LAB — CBC
HEMATOCRIT: 23.2 % — AB (ref 40.0–52.0)
HEMOGLOBIN: 7 g/dL — AB (ref 13.0–18.0)
MCH: 26.9 pg (ref 26.0–34.0)
MCHC: 30.2 g/dL — ABNORMAL LOW (ref 32.0–36.0)
MCV: 88.8 fL (ref 80.0–100.0)
Platelets: 127 10*3/uL — ABNORMAL LOW (ref 150–440)
RBC: 2.61 MIL/uL — AB (ref 4.40–5.90)
RDW: 18.5 % — ABNORMAL HIGH (ref 11.5–14.5)
WBC: 22.9 10*3/uL — ABNORMAL HIGH (ref 3.8–10.6)

## 2016-04-05 LAB — CLOSTRIDIUM DIFFICILE BY PCR: Toxigenic C. Difficile by PCR: POSITIVE — AB

## 2016-04-05 LAB — MRSA PCR SCREENING: MRSA BY PCR: NEGATIVE

## 2016-04-05 MED ORDER — SODIUM CHLORIDE 0.9 % IV SOLN
500.0000 mg | Freq: Every day | INTRAVENOUS | Status: DC
  Administered 2016-04-05 – 2016-04-08 (×4): 500 mg via INTRAVENOUS
  Filled 2016-04-05 (×4): qty 0.5

## 2016-04-05 MED ORDER — INSULIN ASPART 100 UNIT/ML ~~LOC~~ SOLN
0.0000 [IU] | Freq: Three times a day (TID) | SUBCUTANEOUS | Status: DC
  Administered 2016-04-05: 8 [IU] via SUBCUTANEOUS
  Administered 2016-04-06 – 2016-04-07 (×4): 3 [IU] via SUBCUTANEOUS
  Administered 2016-04-07: 2 [IU] via SUBCUTANEOUS
  Administered 2016-04-07 – 2016-04-08 (×2): 5 [IU] via SUBCUTANEOUS
  Administered 2016-04-08 – 2016-04-09 (×3): 3 [IU] via SUBCUTANEOUS
  Administered 2016-04-09 (×2): 5 [IU] via SUBCUTANEOUS
  Administered 2016-04-10: 3 [IU] via SUBCUTANEOUS
  Administered 2016-04-10: 2 [IU] via SUBCUTANEOUS
  Administered 2016-04-11: 3 [IU] via SUBCUTANEOUS
  Administered 2016-04-11: 8 [IU] via SUBCUTANEOUS
  Filled 2016-04-05: qty 5
  Filled 2016-04-05: qty 3
  Filled 2016-04-05: qty 8
  Filled 2016-04-05 (×2): qty 3
  Filled 2016-04-05 (×2): qty 5
  Filled 2016-04-05 (×2): qty 3
  Filled 2016-04-05: qty 2
  Filled 2016-04-05: qty 5
  Filled 2016-04-05: qty 3
  Filled 2016-04-05: qty 8
  Filled 2016-04-05: qty 2
  Filled 2016-04-05 (×3): qty 3

## 2016-04-05 MED ORDER — SODIUM CHLORIDE 0.9 % IV SOLN
2.0000 g | Freq: Two times a day (BID) | INTRAVENOUS | Status: DC
  Administered 2016-04-05 – 2016-04-08 (×6): 2 g via INTRAVENOUS
  Filled 2016-04-05 (×7): qty 2000

## 2016-04-05 MED ORDER — INSULIN ASPART 100 UNIT/ML ~~LOC~~ SOLN
0.0000 [IU] | Freq: Every day | SUBCUTANEOUS | Status: DC
  Administered 2016-04-05 – 2016-04-10 (×3): 2 [IU] via SUBCUTANEOUS
  Filled 2016-04-05 (×3): qty 2

## 2016-04-05 MED ORDER — METRONIDAZOLE 500 MG PO TABS: 500.0000 mg | ORAL_TABLET | Freq: Four times a day (QID) | ORAL | Status: DC

## 2016-04-05 MED ORDER — SODIUM CHLORIDE 0.9 % IR SOLN
Freq: Two times a day (BID) | Status: AC
  Administered 2016-04-05 – 2016-04-08 (×7)
  Filled 2016-04-05 (×7): qty 2

## 2016-04-05 MED ORDER — METRONIDAZOLE 500 MG PO TABS
500.0000 mg | ORAL_TABLET | Freq: Three times a day (TID) | ORAL | Status: DC
  Administered 2016-04-05 – 2016-04-06 (×4): 500 mg via ORAL
  Filled 2016-04-05 (×4): qty 1

## 2016-04-05 NOTE — Progress Notes (Signed)
[facility]  ROUNDING NOTE   Subjective:  Patient well known to Taylor [name]. We follow him for outpatient hemodialysis. He presents now with infected suprapubic catheter. He has been started on IV antibiotics and urology has been consulted. He has altered mental status at this time.   Objective:  Vital signs in last 24 hours:  Temp:  [97.5 F (36.4 C)-100.7 F (38.2 C)] 98 F (36.7 C) (05/28 1203) Pulse Rate:  [103-111] 103 (05/28 1203) Resp:  [12-18] 18 (05/28 1203) BP: (101-131)/(54-66) 124/59 mmHg (05/28 1203) SpO2:  [98 %-100 %] 100 % (05/28 1203) Weight:  [83.099 kg (183 lb 3.2 oz)-84.3 kg (185 lb 13.6 oz)] 83.099 kg (183 lb 3.2 oz) (05/27 2142)  Weight change:  Filed Weights   04/04/16 1702 04/04/16 2142  Weight: 84.3 kg (185 lb 13.6 oz) 83.099 kg (183 lb 3.2 oz)    Intake/Output:     Intake/Output this shift:     Physical Exam: General: Altered mental status  Head: Normocephalic, atraumatic. Moist oral mucosal membranes  Eyes: Anicteric  Neck: Supple, trachea midline  Lungs:  Clear to auscultation normal effort  Heart: S1S2 no rubs  Abdomen:  Soft, nontender, BS present   Extremities: 2+ peripheral edema.  Neurologic: Arousable, confused  Skin: No lesions  Access: LUE AVF    Basic Metabolic Panel:  Recent Labs Lab 04/04/16 1719 04/05/16 0712  NA 134* 130*  K 3.2* 3.4*  CL 93* 93*  CO2 30 25  GLUCOSE 195* 268*  BUN 15 18  CREATININE 1.93* 2.18*  CALCIUM 7.8* 7.4*    Liver Function Tests:  Recent Labs Lab 04/04/16 1719  AST 16  ALT 8*  ALKPHOS 431*  BILITOT 1.4*  PROT 5.6*  ALBUMIN 1.5*   No results for input(s): LIPASE, AMYLASE in the last 168 hours. No results for input(s): AMMONIA in the last 168 hours.  CBC:  Recent Labs Lab 04/04/16 1719 04/05/16 0712  WBC 27.8* 22.9*  NEUTROABS 26.9*  --   HGB 7.6* 7.0*  HCT 25.0* 23.2*  MCV 88.6 88.8  PLT 163 127*    Cardiac Enzymes:  Recent Labs Lab 04/04/16 1719   TROPONINI 0.07*    BNP: Invalid input(s): POCBNP  CBG:  Recent Labs Lab 04/04/16 2201 04/05/16 0757 04/05/16 1202  GLUCAP 208* 244* 267*    Microbiology: Results for orders placed or performed during the hospital encounter of 04/04/16  Blood Culture (routine x 2)     Status: Abnormal (Preliminary result)   Collection Time: 04/04/16  5:19 PM  Result Value Ref Range Status   Specimen Description BLOOD RIGHT HAND  Final   Special Requests BOTTLES DRAWN AEROBIC AND ANAEROBIC  6CC  Final   Culture  Setup Time   Final    GRAM NEGATIVE RODS GRAM POSITIVE COCCI IN PAIRS IN CHAINS IN BOTH AEROBIC AND ANAEROBIC BOTTLES CRITICAL RESULT CALLED TO, READ BACK BY AND VERIFIED WITH: ALLISON THARAKAN AT 0825 ON 04/05/16. CTJ    Culture (A)  Final    ESCHERICHIA COLI ENTEROCOCCUS SPECIES IN BOTH AEROBIC AND ANAEROBIC BOTTLES SUSCEPTIBILITIES TO FOLLOW ONCE INCUBATED FOR BETTER GROWTH.    Report Status PENDING  Incomplete  Blood Culture (routine x 2)     Status: None (Preliminary result)   Collection Time: 04/04/16  5:19 PM  Result Value Ref Range Status   Specimen Description BLOOD RIGHT FOREARM  Final   Special Requests BOTTLES DRAWN AEROBIC AND ANAEROBIC  5CC  Final   Culture  Setup  Time   Final    GRAM NEGATIVE RODS AEROBIC BOTTLE ONLY CRITICAL RESULT CALLED TO, READ BACK BY AND VERIFIED WITH: ALLISON THARAKAN AT 0825 ON 04/05/16. CTJ GRAM POSITIVE COCCI GRAM POSITIVE RODS ANAEROBIC BOTTLE ONLY CRITICAL RESULT CALLED TO, READ BACK BY AND VERIFIED WITH: ALLISON THARAKAN ON 04/05/16 AT 1130 BY QSD    Culture   Final    GRAM NEGATIVE RODS AEROBIC BOTTLE ONLY IDENTIFICATION TO FOLLOW ONCE INCUBATED FOR BETTER GROWTH    Report Status PENDING  Incomplete  Blood Culture ID Panel (Reflexed)     Status: Abnormal   Collection Time: 04/04/16  5:19 PM  Result Value Ref Range Status   Enterococcus species DETECTED (A) NOT DETECTED Final    Comment: CRITICAL RESULT CALLED TO, READ BACK BY  AND VERIFIED WITH: ALLISON THARAKAN FOR ENTEROCOCCUS, ENTEROBACTERIACEAE AND E. COLI AT 0825 ON 04/05/16. CTJ    Vancomycin resistance NOT DETECTED NOT DETECTED Final   Listeria monocytogenes NOT DETECTED NOT DETECTED Final   Staphylococcus species NOT DETECTED NOT DETECTED Final   Staphylococcus aureus NOT DETECTED NOT DETECTED Final   Methicillin resistance NOT DETECTED NOT DETECTED Final   Streptococcus species NOT DETECTED NOT DETECTED Final   Streptococcus agalactiae NOT DETECTED NOT DETECTED Final   Streptococcus pneumoniae NOT DETECTED NOT DETECTED Final   Streptococcus pyogenes NOT DETECTED NOT DETECTED Final   Acinetobacter baumannii NOT DETECTED NOT DETECTED Final   Enterobacteriaceae species DETECTED (A) NOT DETECTED Final   Enterobacter cloacae complex NOT DETECTED NOT DETECTED Final   Escherichia coli DETECTED (A) NOT DETECTED Final   Klebsiella oxytoca NOT DETECTED NOT DETECTED Final   Klebsiella pneumoniae NOT DETECTED NOT DETECTED Final   Proteus species NOT DETECTED NOT DETECTED Final   Serratia marcescens NOT DETECTED NOT DETECTED Final   Carbapenem resistance NOT DETECTED NOT DETECTED Final   Haemophilus influenzae NOT DETECTED NOT DETECTED Final   Neisseria meningitidis NOT DETECTED NOT DETECTED Final   Pseudomonas aeruginosa NOT DETECTED NOT DETECTED Final   Candida albicans NOT DETECTED NOT DETECTED Final   Candida glabrata NOT DETECTED NOT DETECTED Final   Candida krusei NOT DETECTED NOT DETECTED Final   Candida parapsilosis NOT DETECTED NOT DETECTED Final   Candida tropicalis NOT DETECTED NOT DETECTED Final  C difficile quick scan w PCR reflex     Status: Abnormal   Collection Time: 04/05/16 11:57 AM  Result Value Ref Range Status   C Diff antigen POSITIVE (A) NEGATIVE Final   C Diff toxin NEGATIVE NEGATIVE Final   C Diff interpretation   Final    Positive for toxigenic C. difficile, active toxin production not detected. Patient has toxigenic C. difficile  organisms present in the bowel, but toxin was not detected. The patient may be a carrier or the level of toxin in the sample was below the limit  of detection. This information should be used in conjunction with the patient's clinical history when deciding on possible therapy.     Comment: CRITICAL RESULT CALLED TO, READ BACK BY AND VERIFIED WITH: TAYLOR BECK @ 1410 04/05/16 BY T[facility]   Clostridium Difficile by PCR     Status: Abnormal   Collection Time: 04/05/16 11:57 AM  Result Value Ref Range Status   Toxigenic C Difficile by pcr POSITIVE (A) NEGATIVE Final    Comment: CRITICAL RESULT CALLED TO, READ BACK BY AND VERIFIED WITH: TAYLOR BECK @ 1410 04/05/16 BY T[facility]     Coagulation Studies: No results for input(s): LABPROT, INR in the  last 72 hours.  Urinalysis:  Recent Labs  04/04/16 1717  COLORURINE YELLOW*  LABSPEC 1.015  PHURINE 5.0  GLUCOSEU NEGATIVE  HGBUR 2+*  BILIRUBINUR NEGATIVE  KETONESUR NEGATIVE  PROTEINUR 100*  NITRITE NEGATIVE  LEUKOCYTESUR 2+*      Imaging: Dg Chest Port 1 View  04/04/2016  CLINICAL DATA:  Altered mental status EXAM: PORTABLE CHEST 1 VIEW COMPARISON:  None. FINDINGS: Cardiac shadow is at the upper limits of normal in size. Postsurgical changes are again seen. Mild vascular congestion is noted with mild interstitial edema. No focal infiltrate or sizable effusion is noted. IMPRESSION: Mild CHF. Electronically Signed   By: Alcide Clever M.D.   On: 04/04/2016 17:44     Medications:   . 0.9 % NaCl with KCl 20 mEq / L 100 mL/hr at 04/04/16 2326   . ampicillin (OMNIPEN) IV  2 g Intravenous Q12H  . aspirin EC  81 mg Oral Daily  . calcium-vitamin D  1 tablet Oral Q breakfast  . cinacalcet  30 mg Oral Q supper  . clopidogrel  75 mg Oral Daily  . gentamicin irrigation   Irrigation BID  . heparin  5,000 Units Subcutaneous Q8H  . levothyroxine  100 mcg Oral QHS  . liothyronine  10 mcg Oral Daily  . meropenem (MERREM) IV  500 mg Intravenous Daily  .  metroNIDAZOLE  500 mg Oral Q8H  . omega-3 acid ethyl esters  1 g Oral Daily  . pantoprazole  40 mg Oral BID  . pregabalin  75 mg Oral BID  . rosuvastatin  10 mg Oral QHS   acetaminophen **OR** acetaminophen, acetaminophen, albuterol, albuterol, midodrine, morphine injection, ondansetron **OR** ondansetron (ZOFRAN) IV, traMADol  Assessment/ Plan:  45 y.o. male with long standing T1DM, HTN, ESRD, AOCD, SHPTH, LUE AVF, CAD s/p CABG 12/10, right foot diabetic ulcer, MSSA bacteremia, right scrotal cellulitis 5/12, admission for DKA 5/15, Echo on nov 5th 2015: EF 50-55%, concentric LVH, moderate to severe TR, severe pulmonary hypertension, scrotal/penile abscess with urethrocutaneous fistula s/p SPC placement 2/17, admitted with septic shock 01/07/16 due to E Coli UTI, admission for E. Coli and Enterococcus sepsis 5/17  [facility] [address] TTS  1. End-stage renal disease on hematemesis Tuesday, Thursday, Saturday.  Patient had hemodialysis yesterday. No acute indication for dialysis today. We will plan for dialysis again on Tuesday.  2. Sepsis.  Both Escherichia coli and enterococcus noted in the blood. Patient has been started on meropenem and has also been started on gentamicin irrigation.  Suprapubic catheter changed 04/05/16.  3. Anemia of chronic kidney disease. Hemoglobin down to 7.0 in the setting of sepsis and history of prior coronary artery disease. Consider blood transfusion however defer this to hospitalist.  4. Secondary hyperparathyroidism. Check intact PTH and phosphorus with next dialysis treatment.  5. Severe protein calorie malnutrition. As an outpatient we had refer the patient for PEG tube placement. Ideally his sepsis should be treated prior to PEG tube placement.   LOS: 1 Fields Oros 5/28/20174:05 PM

## 2016-04-05 NOTE — Progress Notes (Signed)
Attempted to irrigate suprapubic catheter with normal saline as per MD order. RN was able to get flash back of normal saline, but not urine despite numerous attempts. Bladder scan completed, showed 323 mL of urine in bladder. MD Pyreddy notified. MD to place urology consult. Will continue to monitor.   Taylor [name]

## 2016-04-05 NOTE — Progress Notes (Addendum)
[facility]AMP note 45 yo M with ESRD, DM, suprapubic catheter, adm 5-27 with thick urine, pyuria and hematuria. He was started on vanco/zosyn,  now with BCx growing both E coli and enterococcus.  He is on merrem and amp.    Would Repeat his BCx Check TTE Check status of his hd site Continue his dual antibiotic therapy while we await his sensi  ? Hx of C diff- watch his BM
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
## 2016-04-05 NOTE — Progress Notes (Signed)
Subjective: Admitted with sepsis. Mildly confused.  Objective: Vital signs in last 24 hours: Temp:  [97.5 F (36.4 C)-100.7 F (38.2 C)] 98 F (36.7 C) (05/28 1203) Pulse Rate:  [103-111] 103 (05/28 1203) Resp:  [12-18] 18 (05/28 1203) BP: (101-131)/(54-66) 124/59 mmHg (05/28 1203) SpO2:  [98 %-100 %] 100 % (05/28 1203) Weight:  [83.099 kg (183 lb 3.2 oz)-84.3 kg (185 lb 13.6 oz)] 83.099 kg (183 lb 3.2 oz) (05/27 2142) Weight change:  Last BM Date:  (Unknown)  Intake/Output from previous day:   Intake/Output this shift:    General appearance: alert and mildly confused Head: Normocephalic, without obvious abnormality, atraumatic Eyes: conjunctivae/corneas clear. PERRL, EOM's intact. Fundi benign. Neck: no adenopathy, no carotid bruit, no JVD, supple, symmetrical, trachea midline and thyroid not enlarged, symmetric, no tenderness/mass/nodules Resp: clear to auscultation bilaterally and normal percussion bilaterally Cardio: regular rate and rhythm and no rub GI: soft, non-tender; bowel sounds normal; no masses,  no organomegaly Pelvic: Suprapubic cath in place. Turbid looking urine.  Extremities: extremities normal, atraumatic, no cyanosis or edema Skin: large stage 4 sacral decubitis ulcer Neurologic: Grossly normal  Lab Results:  Recent Labs  04/04/16 1719 04/05/16 0712  WBC 27.8* 22.9*  HGB 7.6* 7.0*  HCT 25.0* 23.2*  PLT 163 127*   BMET  Recent Labs  04/04/16 1719 04/05/16 0712  NA 134* 130*  K 3.2* 3.4*  CL 93* 93*  CO2 30 25  GLUCOSE 195* 268*  BUN 15 18  CREATININE 1.93* 2.18*  CALCIUM 7.8* 7.4*    Studies/Results: Dg Chest Port 1 View  04/04/2016  CLINICAL DATA:  Altered mental status EXAM: PORTABLE CHEST 1 VIEW COMPARISON:  None. FINDINGS: Cardiac shadow is at the upper limits of normal in size. Postsurgical changes are again seen. Mild vascular congestion is noted with mild interstitial edema. No focal infiltrate or sizable effusion is noted.  IMPRESSION: Mild CHF. Electronically Signed   By: Alcide Clever M.D.   On: 04/04/2016 17:44    Medications:  Scheduled: . ampicillin (OMNIPEN) IV  2 g Intravenous Q12H  . aspirin EC  81 mg Oral Daily  . calcium-vitamin D  1 tablet Oral Q breakfast  . cinacalcet  30 mg Oral Q supper  . clopidogrel  75 mg Oral Daily  . heparin  5,000 Units Subcutaneous Q8H  . levothyroxine  100 mcg Oral QHS  . liothyronine  10 mcg Oral Daily  . meropenem (MERREM) IV  500 mg Intravenous Daily  . omega-3 acid ethyl esters  1 g Oral Daily  . pantoprazole  40 mg Oral BID  . pregabalin  75 mg Oral BID  . rosuvastatin  10 mg Oral QHS   Continuous: . 0.9 % NaCl with KCl 20 mEq / L 100 mL/hr at 04/04/16 2326    Assessment/Plan: 1. Sepsis: Secondary to UTI. On IV abx. Continue supportive care. 2. UTI: Culture growing e.coli and enterococus. 3. Bacteremia: Growing same as above. On IV abx. 4. ESRD: HD per nephrology. 5. Sacral Decubitis Ulcer: Present on admission. Have consulted wound care. Likely needs debridement. 6. Severe Spinal Stenosis: Appears comfortable. 7. Suprapubic cath: Cannot get any urine return despite flushing and manipulation. Have consulted urology for evaluation.  Time spent 45 min   LOS: 1 day   Gracelyn Nurse 04/05/2016, 1:02 PM

## 2016-04-05 NOTE — Progress Notes (Signed)
Dr. Marlou Porch called this RN regarding consult for patient. Suprapubic catheter not draining despite irrigation by night shift RN. Patient was bladder scanned to show >300cc. MD aware - said he would speak with rounding physician and assess patient tomorrow morning. No new orders at this time. Will continue to monitor. No signs of distress at this time.
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
## 2016-04-05 NOTE — Consult Note (Addendum)
Pharmacy Antibiotic Note  Taylor [name] is a 45 y.o. male admitted on 04/04/2016 with bacteremia.  Pharmacy has been consulted for Ampicillin and Meropenem dosing.  Plan: BCID fired for positive blood cultures with E.coli in 3 of 4 and Enterococcus in 2 of 4 bottles. Per MD Letitia Libra, will follow BCID protocol and initiate Meropenem and Ampicillin to cover for possible ESBL E.coli and Enterococcus. Pt is ESRD on HD to be followed by nephrology.    Will give Meropenem 500mg  IV Q24H to be administered after dialysis on dialysis days and Ampicillin 2g IV q12hrs.  Will give doses now and follow up on HD schedule (last HD session 5/27 AM).      Addendum: Lab called with final result of last bottle of anaerobic blood positive for GPRs. Per MD 6/27, will continue with current orders. He spoke to [facility] ID physician who agreed with regimen.  Height: 5\' 10"  (177.8 cm) Weight: 183 lb 3.2 oz (83.099 kg) IBW/kg (Calculated) : 73  Temp (24hrs), Avg:98.7 F (37.1 C), Min:97.5 F (36.4 C), Max:100.7 F (38.2 C)   Recent Labs Lab 04/04/16 1716 04/04/16 1719 04/05/16 0712  WBC  --  27.8* 22.9*  CREATININE  --  1.93* 2.18*  LATICACIDVEN 1.5  --   --     Estimated Creatinine Clearance: 43.3 mL/min (by C-G formula based on Cr of 2.18).    Allergies  Allergen Reactions  . Amoxicillin-Pot Clavulanate Nausea And Vomiting and Itching  . Contrast Media [Iodinated Diagnostic Agents] Other (See Comments)    Unknown reaction  . 2nd Skin Quick Heal Other (See Comments)  . Rifampin Nausea And Vomiting  . Tape Other (See Comments)    Antimicrobials this admission: Meropenem 5/28 >>  Ampicillin 5/28 >> Vancomycin and Zosyn in ED   Microbiology results: Recent Results (from the past 240 hour(s))  Blood Culture (routine x 2)     Status: Abnormal (Preliminary result)   Collection Time: 04/04/16  5:19 PM  Result Value Ref Range Status   Specimen Description BLOOD RIGHT HAND  Final    Special Requests BOTTLES DRAWN AEROBIC AND ANAEROBIC  6CC  Final   Culture  Setup Time   Final    GRAM NEGATIVE RODS GRAM POSITIVE COCCI IN PAIRS IN CHAINS IN BOTH AEROBIC AND ANAEROBIC BOTTLES CRITICAL RESULT CALLED TO, READ BACK BY AND VERIFIED WITH: Caryn Gienger AT 0825 ON 04/05/16. CTJ    Culture (A)  Final    ESCHERICHIA COLI ENTEROCOCCUS SPECIES IN BOTH AEROBIC AND ANAEROBIC BOTTLES SUSCEPTIBILITIES TO FOLLOW ONCE INCUBATED FOR BETTER GROWTH.    Report Status PENDING  Incomplete  Blood Culture (routine x 2)     Status: None (Preliminary result)   Collection Time: 04/04/16  5:19 PM  Result Value Ref Range Status   Specimen Description BLOOD RIGHT FOREARM  Final   Special Requests BOTTLES DRAWN AEROBIC AND ANAEROBIC  5CC  Final   Culture  Setup Time   Final    GRAM NEGATIVE RODS AEROBIC BOTTLE ONLY CRITICAL RESULT CALLED TO, READ BACK BY AND VERIFIED WITH: Milena Liggett AT 0825 ON 04/05/16. CTJ    Culture   Final    GRAM NEGATIVE RODS AEROBIC BOTTLE ONLY IDENTIFICATION TO FOLLOW ONCE INCUBATED FOR BETTER GROWTH    Report Status PENDING  Incomplete  Blood Culture ID Panel (Reflexed)     Status: Abnormal   Collection Time: 04/04/16  5:19 PM  Result Value Ref Range Status   Enterococcus species DETECTED (A) NOT DETECTED  Final    Comment: CRITICAL RESULT CALLED TO, READ BACK BY AND VERIFIED WITH: Krysti Hickling FOR ENTEROCOCCUS, ENTEROBACTERIACEAE AND E. COLI AT 0825 ON 04/05/16. CTJ    Vancomycin resistance NOT DETECTED NOT DETECTED Final   Listeria monocytogenes NOT DETECTED NOT DETECTED Final   Staphylococcus species NOT DETECTED NOT DETECTED Final   Staphylococcus aureus NOT DETECTED NOT DETECTED Final   Methicillin resistance NOT DETECTED NOT DETECTED Final   Streptococcus species NOT DETECTED NOT DETECTED Final   Streptococcus agalactiae NOT DETECTED NOT DETECTED Final   Streptococcus pneumoniae NOT DETECTED NOT DETECTED Final   Streptococcus pyogenes NOT  DETECTED NOT DETECTED Final   Acinetobacter baumannii NOT DETECTED NOT DETECTED Final   Enterobacteriaceae species DETECTED (A) NOT DETECTED Final   Enterobacter cloacae complex NOT DETECTED NOT DETECTED Final   Escherichia coli DETECTED (A) NOT DETECTED Final   Klebsiella oxytoca NOT DETECTED NOT DETECTED Final   Klebsiella pneumoniae NOT DETECTED NOT DETECTED Final   Proteus species NOT DETECTED NOT DETECTED Final   Serratia marcescens NOT DETECTED NOT DETECTED Final   Carbapenem resistance NOT DETECTED NOT DETECTED Final   Haemophilus influenzae NOT DETECTED NOT DETECTED Final   Neisseria meningitidis NOT DETECTED NOT DETECTED Final   Pseudomonas aeruginosa NOT DETECTED NOT DETECTED Final   Candida albicans NOT DETECTED NOT DETECTED Final   Candida glabrata NOT DETECTED NOT DETECTED Final   Candida krusei NOT DETECTED NOT DETECTED Final   Candida parapsilosis NOT DETECTED NOT DETECTED Final   Candida tropicalis NOT DETECTED NOT DETECTED Final    Thank you for allowing pharmacy to be a part of this patient's care.  Cy Blamer 04/05/2016 10:05 AM

## 2016-04-05 NOTE — Clinical Social Work Note (Signed)
Clinical Social Work Assessment  Patient Details  Name: Taylor [name] MRN: [id number] Date of Birth: [birth year]  Date of referral:  04/05/16               Reason for consult:  Facility Placement, Other (Comment Required) (From [facility] )                Permission sought to share information with:  Chartered certified accountant granted to share information::  Yes, Verbal Permission Granted  Name::      [facility]   Agency::   [facility]   Relationship::     Contact Information:     Housing/Transportation Living arrangements for the past 2 months:  [place] of Information:  Other (Comment Required) (Brother Tim ) Patient Interpreter Needed:  None Criminal Activity/Legal Involvement Pertinent to Current Situation/Hospitalization:  No - Comment as needed Significant Relationships:  Siblings Lives with:  Facility Resident Do you feel safe going back to the place where you live?  Yes Need for family participation in patient care:  Yes (Comment)  Care giving concerns:  Patient is from [facility].    Social Worker assessment / plan:  Holiday representative (CSW) received consult that patient is from [facility]. CSW met with patient and his brother Octavia Bruckner [phone] was at bedside. CSW introduced self and explained role of CSW department. Per Tim patient just transitioned to a long term care resident at [facility] last week. Per brother patient has been in and out of [facility] over the course of several months. Brother is agreeable for patient to return to [facility].   FL2 complete and faxed out. CSW will continue to follow and assist as needed.   Employment status:  Retired Forensic scientist:  Information systems manager, Medicaid In [place] PT Recommendations:  Not assessed at this time Information / Referral to community resources:  [place]  Patient/Family's Response to care:  Patient's brother is agreeable for patient to return to [facility].    Patient/Family's Understanding of and Emotional Response to Diagnosis, Current Treatment, and Prognosis: Patient's brother was pleasant and thanked CSW for visit.   Emotional Assessment Appearance:  Appears stated age Attitude/Demeanor/Rapport:    Affect (typically observed):  Pleasant Orientation:  Oriented to Self, Oriented to Place, Oriented to  Time Alcohol / Substance use:  Not Applicable Psych involvement (Current and /or in the community):  No (Comment)  Discharge Needs  Concerns to be addressed:  Discharge Planning Concerns Readmission within the last 30 days:  Yes Current discharge risk:  Dependent with Mobility Barriers to Discharge:  Continued Medical Work up   Elwyn Reach 04/05/2016, 12:17 PM

## 2016-04-05 NOTE — Progress Notes (Signed)
Dr. Letitia Libra notified of large, c.diff like BM - orders to rule out cdiff. Updated regarding decubitus ulcers, waiting for WOC nurse, and still no output from suprapubic catheter - instructed to call back in 4 hours.

## 2016-04-05 NOTE — Progress Notes (Signed)
Pt suprapubic catheter not draining. Catheter line looks to be clogged. MD Pyreddy notified for order to flush catheter. Verbal order given to flush catheter line as needed. RN will place order. Will continue to monitor.   Mayra Neer M

## 2016-04-05 NOTE — Progress Notes (Signed)
Pt tested positive for C.Diff a week ago at facility. Pt no longer having stool. RN clarified with MD Crosley about isolation precautions. Per MD Crosley, pt will not be on precautions since he is no longer symptomatic but if pt starts experiencing loose stools we can retest his stool. Will continue to monitor.

## 2016-04-05 NOTE — Progress Notes (Signed)
Sacral and buttock dressing changed again post BM.  Significant draining noted since the noon dressing change. Saturated gauze dressing with significant drainage on chux pad.  Edema increasing in legs and scrotum.  Receiving IV NS with K+ at 100 ml/hr.  Alerted Dr. Letitia Libra, IV discontinued.

## 2016-04-05 NOTE — Progress Notes (Addendum)
Pt is diabetic but there are no orders for accuchecks. MD Crosley notified. Verbal order given for AC&HS accuchecks. RN to place order. Will continue to monitor.   Mayra Neer M

## 2016-04-06 ENCOUNTER — Inpatient Hospital Stay: Payer: Medicare Other

## 2016-04-06 DIAGNOSIS — Z936 Other artificial openings of urinary tract status: Secondary | ICD-10-CM

## 2016-04-06 DIAGNOSIS — N390 Urinary tract infection, site not specified: Secondary | ICD-10-CM

## 2016-04-06 DIAGNOSIS — B9620 Unspecified Escherichia coli [E. coli] as the cause of diseases classified elsewhere: Secondary | ICD-10-CM

## 2016-04-06 DIAGNOSIS — R4182 Altered mental status, unspecified: Secondary | ICD-10-CM

## 2016-04-06 LAB — URINE CULTURE

## 2016-04-06 LAB — GLUCOSE, CAPILLARY
GLUCOSE-CAPILLARY: 181 mg/dL — AB (ref 65–99)
Glucose-Capillary: 198 mg/dL — ABNORMAL HIGH (ref 65–99)
Glucose-Capillary: 152 mg/dL — ABNORMAL HIGH (ref 65–99)
Glucose-Capillary: 152 mg/dL — ABNORMAL HIGH (ref 65–99)

## 2016-04-06 LAB — PREPARE RBC (CROSSMATCH)

## 2016-04-06 MED ORDER — VANCOMYCIN 50 MG/ML ORAL SOLUTION
125.0000 mg | Freq: Four times a day (QID) | ORAL | Status: DC
  Administered 2016-04-06 – 2016-04-08 (×7): 125 mg via ORAL
  Filled 2016-04-06 (×11): qty 2.5

## 2016-04-06 MED ORDER — SODIUM CHLORIDE 0.9 % IV SOLN: Freq: Once | INTRAVENOUS | Status: DC

## 2016-04-06 MED ORDER — DAKINS (1/4 STRENGTH) 0.125 % EX SOLN
Freq: Two times a day (BID) | CUTANEOUS | Status: AC
  Administered 2016-04-06: 60
  Administered 2016-04-06: 23:00:00
  Administered 2016-04-07: 50
  Administered 2016-04-07 – 2016-04-08 (×3)
  Filled 2016-04-06: qty 473

## 2016-04-06 NOTE — Progress Notes (Signed)
Spoke with Dr. Letitia Libra.  He approved administering the transfusion tomorrow during dialysis as pt has poor venous access options.  Blood is to be sent to Dialysis with the patient.

## 2016-04-06 NOTE — Consult Note (Signed)
WOC wound consult note Reason for Consult:Unstageable pressure injury to sacrum and bilateral ischial tuberosities. Present on admission.  Wound type:Pressure injury Pressure Ulcer POA: Yes Measurement: Sacrum 16 cm x 10 cm devitalized tissue to wound bed.  Left ischium 2 cm x 2 cm necrotic wound bed RIght ischium 1 cm x 2 cm necrotic wound bed.  LEft heel with 1 cm fissure and pink, new epithelium. Bilateral lower legs are dry and scaly.  Wound EVO:[id number] and ischial wounds with necrotic tissue LEft heel fissure pink and moist Drainage (amount, consistency, odor) Moderate purulent drainage with foul, necrotic odor.  Periwound:Erythema Bilateral lower legs are dry and scaly.  Recommend surgical consult.  Dressing procedure/placement/frequency:Cleanse wound to sacrum with NS and pat gently dry.  Apply Dakins moistened gauze to wound bed. Cover with ABD pad and tape. Change twice daily.  Float heels.  Will not follow at this time.  Please re-consult if needed.  Maple Hudson RN BSN CWON Pager [phone]

## 2016-04-06 NOTE — Plan of Care (Signed)
Problem: Skin Integrity: Goal: Risk for impaired skin integrity will decrease Outcome: Progressing Moved to a rotating air mattress to prevent further skin injury or breakdown.

## 2016-04-06 NOTE — Care Management Important Message (Signed)
Important Message  Patient Details  Name: JORDY VERBA MRN: [id number] Date of Birth: [birth year]   Medicare Important Message Given:  Yes    Marily Memos, RN 04/06/2016, 10:25 AM

## 2016-04-06 NOTE — Progress Notes (Signed)
Prior to assessment patient had removed his oxygen.  Pulse ox measured without oxygen: 84%.  Oxygen reapplied, O2 increased to 99%.  Would nurse evaluated wounds.  Will write care orders and is submitting consult of surgical consult.

## 2016-04-06 NOTE — Progress Notes (Signed)
      [facility] ANTI-MICROBIAL STEWARDSHIP NOTE:   Date: 04/06/2016  Patient name: Taylor [name]  Medical record number: [id number]  Date of birth: 27-Aug-1971    45 year old man with multiple comorbidities including spinal stenosis, large decubitus ulcer, prior MRSA bacteremia, suprapubic catheter infection found to have polymicrobial bacteremia with Enterococcus and E coli in 2/2 blood cultures  He had TTE without vegetations.   Suprapubic catheter has been exchanged  He now has C difficile PCR + with diarrhea  #1 Polymicrobial bacteremia with E coli and Enterococcus:  Would consider TEE to consider whether patient will need TWO agents for Enterococcal endocarditis   Textbook would be AMP and GENT but former would require a dedicated line  He also still needs E coli covered   At present being on dual beta lactam therapy is reasonable with AMP + carbapenem  He is also being rx for CDI and has been on flagyl, changing to oral vancomycin  Case discussed with Dr. Chipper Oman.  Dr. Sampson Goon is back tomorrow.    Acey Lav 04/06/2016, 3:46 PM

## 2016-04-06 NOTE — Progress Notes (Signed)
[facility]  ROUNDING NOTE   Subjective:  Patient remains lethargic but is arousable. Continues to receive antibiotic therapy. Next hemodialysis tomorrow.   Objective:  Vital signs in last 24 hours:  Temp:  [97.6 F (36.4 C)-99.7 F (37.6 C)] 97.6 F (36.4 C) (05/29 1212) Pulse Rate:  [102-106] 102 (05/29 1212) Resp:  [14-20] 20 (05/29 1212) BP: (104-120)/(47-58) 106/51 mmHg (05/29 1212) SpO2:  [99 %-100 %] 100 % (05/29 1212)  Weight change:  Filed Weights   04/04/16 1702 04/04/16 2142  Weight: 84.3 kg (185 lb 13.6 oz) 83.099 kg (183 lb 3.2 oz)    Intake/Output: I/O last 3 completed shifts: In: 1160 [I.V.:1010; IV Piggyback:150] Out: 0    Intake/Output this shift:  Total I/O In: 100 [IV Piggyback:100] Out: -   Physical Exam: General: Lethargic but arousable   Head: Normocephalic, atraumatic. Moist oral mucosal membranes  Eyes: Anicteric  Neck: Supple, trachea midline  Lungs:  Clear to auscultation normal effort  Heart: S1S2 no rubs  Abdomen:  Soft, nontender, BS present   Extremities: 2+ peripheral edema.  Neurologic: Lethargic but arousable, confused  Skin: Dry skin noted on bilateral lower extremities   Access: LUE AVF    Basic Metabolic Panel:  Recent Labs Lab 04/04/16 1719 04/05/16 0712  NA 134* 130*  K 3.2* 3.4*  CL 93* 93*  CO2 30 25  GLUCOSE 195* 268*  BUN 15 18  CREATININE 1.93* 2.18*  CALCIUM 7.8* 7.4*    Liver Function Tests:  Recent Labs Lab 04/04/16 1719  AST 16  ALT 8*  ALKPHOS 431*  BILITOT 1.4*  PROT 5.6*  ALBUMIN 1.5*   No results for input(s): LIPASE, AMYLASE in the last 168 hours. No results for input(s): AMMONIA in the last 168 hours.  CBC:  Recent Labs Lab 04/04/16 1719 04/05/16 0712  WBC 27.8* 22.9*  NEUTROABS 26.9*  --   HGB 7.6* 7.0*  HCT 25.0* 23.2*  MCV 88.6 88.8  PLT 163 127*    Cardiac Enzymes:  Recent Labs Lab 04/04/16 1719  TROPONINI 0.07*    BNP: Invalid input(s):  POCBNP  CBG:  Recent Labs Lab 04/05/16 1202 04/05/16 1628 04/05/16 2205 04/06/16 0730 04/06/16 1209  GLUCAP 267* 281* 214* 198* 181*    Microbiology: Results for orders placed or performed during the hospital encounter of 04/04/16  Urine culture     Status: Abnormal   Collection Time: 04/04/16  5:16 PM  Result Value Ref Range Status   Specimen Description URINE, RANDOM  Final   Special Requests NONE  Final   Culture MULTIPLE SPECIES PRESENT, SUGGEST RECOLLECTION (A)  Final   Report Status 04/06/2016 FINAL  Final  Blood Culture (routine x 2)     Status: Abnormal (Preliminary result)   Collection Time: 04/04/16  5:19 PM  Result Value Ref Range Status   Specimen Description BLOOD RIGHT HAND  Final   Special Requests BOTTLES DRAWN AEROBIC AND ANAEROBIC  6CC  Final   Culture  Setup Time   Final    GRAM NEGATIVE RODS GRAM POSITIVE COCCI IN PAIRS IN CHAINS IN BOTH AEROBIC AND ANAEROBIC BOTTLES CRITICAL RESULT CALLED TO, READ BACK BY AND VERIFIED WITH: ALLISON THARAKAN AT 0825 ON 04/05/16. CTJ    Culture (A)  Final    ESCHERICHIA COLI ENTEROCOCCUS SPECIES IN BOTH AEROBIC AND ANAEROBIC BOTTLES SUSCEPTIBILITIES TO FOLLOW ONCE INCUBATED FOR BETTER GROWTH.    Report Status PENDING  Incomplete  Blood Culture (routine x 2)  Status: Abnormal (Preliminary result)   Collection Time: 04/04/16  5:19 PM  Result Value Ref Range Status   Specimen Description BLOOD RIGHT FOREARM  Final   Special Requests BOTTLES DRAWN AEROBIC AND ANAEROBIC  5CC  Final   Culture  Setup Time   Final    GRAM NEGATIVE RODS AEROBIC BOTTLE ONLY CRITICAL RESULT CALLED TO, READ BACK BY AND VERIFIED WITH: ALLISON THARAKAN AT 0825 ON 04/05/16. CTJ GRAM POSITIVE COCCI GRAM POSITIVE RODS ANAEROBIC BOTTLE ONLY CRITICAL RESULT CALLED TO, READ BACK BY AND VERIFIED WITH: ALLISON THARAKAN ON 04/05/16 AT 1130 BY QSD    Culture (A)  Final    ESCHERICHIA COLI SUSCEPTIBILITIES TO FOLLOW GRAM POSITIVE COCCI GRAM POSITIVE  RODS IDENTIFICATION TO FOLLOW    Report Status PENDING  Incomplete  Blood Culture ID Panel (Reflexed)     Status: Abnormal   Collection Time: 04/04/16  5:19 PM  Result Value Ref Range Status   Enterococcus species DETECTED (A) NOT DETECTED Final    Comment: CRITICAL RESULT CALLED TO, READ BACK BY AND VERIFIED WITH: ALLISON THARAKAN FOR ENTEROCOCCUS, ENTEROBACTERIACEAE AND E. COLI AT 0825 ON 04/05/16. CTJ    Vancomycin resistance NOT DETECTED NOT DETECTED Final   Listeria monocytogenes NOT DETECTED NOT DETECTED Final   Staphylococcus species NOT DETECTED NOT DETECTED Final   Staphylococcus aureus NOT DETECTED NOT DETECTED Final   Methicillin resistance NOT DETECTED NOT DETECTED Final   Streptococcus species NOT DETECTED NOT DETECTED Final   Streptococcus agalactiae NOT DETECTED NOT DETECTED Final   Streptococcus pneumoniae NOT DETECTED NOT DETECTED Final   Streptococcus pyogenes NOT DETECTED NOT DETECTED Final   Acinetobacter baumannii NOT DETECTED NOT DETECTED Final   Enterobacteriaceae species DETECTED (A) NOT DETECTED Final   Enterobacter cloacae complex NOT DETECTED NOT DETECTED Final   Escherichia coli DETECTED (A) NOT DETECTED Final   Klebsiella oxytoca NOT DETECTED NOT DETECTED Final   Klebsiella pneumoniae NOT DETECTED NOT DETECTED Final   Proteus species NOT DETECTED NOT DETECTED Final   Serratia marcescens NOT DETECTED NOT DETECTED Final   Carbapenem resistance NOT DETECTED NOT DETECTED Final   Haemophilus influenzae NOT DETECTED NOT DETECTED Final   Neisseria meningitidis NOT DETECTED NOT DETECTED Final   Pseudomonas aeruginosa NOT DETECTED NOT DETECTED Final   Candida albicans NOT DETECTED NOT DETECTED Final   Candida glabrata NOT DETECTED NOT DETECTED Final   Candida krusei NOT DETECTED NOT DETECTED Final   Candida parapsilosis NOT DETECTED NOT DETECTED Final   Candida tropicalis NOT DETECTED NOT DETECTED Final  C difficile quick scan w PCR reflex     Status: Abnormal    Collection Time: 04/05/16 11:57 AM  Result Value Ref Range Status   C Diff antigen POSITIVE (A) NEGATIVE Final   C Diff toxin NEGATIVE NEGATIVE Final   C Diff interpretation   Final    Positive for toxigenic C. difficile, active toxin production not detected. Patient has toxigenic C. difficile organisms present in the bowel, but toxin was not detected. The patient may be a carrier or the level of toxin in the sample was below the limit  of detection. This information should be used in conjunction with the patient's clinical history when deciding on possible therapy.     Comment: CRITICAL RESULT CALLED TO, READ BACK BY AND VERIFIED WITH: TAYLOR BECK @ 1410 04/05/16 BY [facility]   Clostridium Difficile by PCR     Status: Abnormal   Collection Time: 04/05/16 11:57 AM  Result Value Ref Range Status  Toxigenic C Difficile by pcr POSITIVE (A) NEGATIVE Final    Comment: CRITICAL RESULT CALLED TO, READ BACK BY AND VERIFIED WITH: TAYLOR BECK @ 1410 04/05/16 BY T[facility]   MRSA PCR Screening     Status: None   Collection Time: 04/05/16 10:20 PM  Result Value Ref Range Status   MRSA by PCR NEGATIVE NEGATIVE Final    Comment:        The GeneXpert MRSA Assay (FDA approved for NASAL specimens only), is one component of a comprehensive MRSA colonization surveillance program. It is not intended to diagnose MRSA infection nor to guide or monitor treatment for MRSA infections.     Coagulation Studies: No results for input(s): LABPROT, INR in the last 72 hours.  Urinalysis:  Recent Labs  04/04/16 1717  COLORURINE YELLOW*  LABSPEC 1.015  PHURINE 5.0  GLUCOSEU NEGATIVE  HGBUR 2+*  BILIRUBINUR NEGATIVE  KETONESUR NEGATIVE  PROTEINUR 100*  NITRITE NEGATIVE  LEUKOCYTESUR 2+*      Imaging: US Renal  04/06/2016  CLINICAL DATA:  History of urinary retention. EXAM: RENAL / URINARY TRACT ULTRASOUND COMPLETE COMPARISON:  CT of 02/20/2015 FINDINGS: Right Kidney: Length: 9.6 cm. No hydronephrosis.  Renal cortical thinning and increased echogenicity. Left Kidney: Length: 9.0 cm. No hydronephrosis. Renal cortical thinning and increased echogenicity. Bladder: Collapsed around a Foley catheter. Incidental note is made of large amount of ascites. Bilateral pleural effusions. Splenomegaly, with splenic volume of 680 cc. IMPRESSION: 1. Bladder collapsed around a Foley catheter. 2. Medical renal disease, without hydronephrosis. 3. Ascites and bilateral pleural effusions, suggesting fluid overload. 4. Splenomegaly. Electronically Signed   By: Jeronimo Greaves M.D.   On: 04/06/2016 10:42   Dg Chest Port 1 View  04/04/2016  CLINICAL DATA:  Altered mental status EXAM: PORTABLE CHEST 1 VIEW COMPARISON:  None. FINDINGS: Cardiac shadow is at the upper limits of normal in size. Postsurgical changes are again seen. Mild vascular congestion is noted with mild interstitial edema. No focal infiltrate or sizable effusion is noted. IMPRESSION: Mild CHF. Electronically Signed   By: Alcide Clever M.D.   On: 04/04/2016 17:44     Medications:     . ampicillin (OMNIPEN) IV  2 g Intravenous Q12H  . aspirin EC  81 mg Oral Daily  . calcium-vitamin D  1 tablet Oral Q breakfast  . cinacalcet  30 mg Oral Q supper  . clopidogrel  75 mg Oral Daily  . gentamicin irrigation   Irrigation BID  . heparin  5,000 Units Subcutaneous Q8H  . insulin aspart  0-15 Units Subcutaneous TID WC  . insulin aspart  0-5 Units Subcutaneous QHS  . levothyroxine  100 mcg Oral QHS  . liothyronine  10 mcg Oral Daily  . meropenem (MERREM) IV  500 mg Intravenous Daily  . metroNIDAZOLE  500 mg Oral Q8H  . omega-3 acid ethyl esters  1 g Oral Daily  . pantoprazole  40 mg Oral BID  . pregabalin  75 mg Oral BID  . rosuvastatin  10 mg Oral QHS  . sodium hypochlorite   Irrigation BID   acetaminophen **OR** acetaminophen, acetaminophen, albuterol, albuterol, midodrine, morphine injection, ondansetron **OR** ondansetron (ZOFRAN) IV, traMADol  Assessment/  Plan:  45 y.o. 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 Coli and Enterococcus sepsis 5/17  [facility] [address]  1. End-stage renal disease on hematemesis Tuesday, Thursday, Saturday.  Patient due for hemodialysis again tomorrow. We will prepare orders.  2. Sepsis.  Both Escherichia coli and enterococcus noted in the blood. Patient has been started on meropenem and has also been started on gentamicin irrigation.  Suprapubic catheter changed 04/05/16.  3. Anemia of chronic kidney disease. Hemoglobin yesterday was 7.0. Continue to periodically monitor. Consider blood transfusion for hemoglobin of less than 7.  4. Secondary hyperparathyroidism. Check intact PTH and phosphorus with next dialysis treatment.  5. Severe protein calorie malnutrition. As an outpatient we had refer the patient for PEG tube placement. Ideally his sepsis should be treated prior to PEG tube placement.   LOS: 2 Sheena Donegan 5/29/20173:39 PM

## 2016-04-06 NOTE — Progress Notes (Signed)
Have ordered surgery consult for sacral decubitis wound. Discussed with Dr. Everlene Farrier. Pt. Is on ASA and plavix. No recent cardiac stenting. Will hold these medication for procedure. Will need to be restarted post op. Will also transfuse 1 unit blood.

## 2016-04-06 NOTE — Consult Note (Addendum)
Patient ID: Taylor [name], male   DOB: 06-25-1971, 45 y.o.   MRN: [id number]  HPI Taylor [name] is a 45 y.o. male see by Dr. Letitia Libra for large sacral decubitus. Patient admitted for sepsis and altered mental status, hx ESRD on HD, DM, CAD s/p CABG and stents, suprapubic catheter, admmitted 5-27 with thick urine, pyuria and hematuria.  now with BCx growing both E coli and enterococcus. + for C diff. He has a history of spinal stenosis and has been unable to walk for at least the past 8 months. He is bedbound. Apparently the decubitus ulcer was present since his admission on sure about the details. Some of the history is taken and from the brother who is by his bedside Currently on both ASA and Plavix and Hb down to 7.  HPI  Past Medical History  Diagnosis Date  . End stage renal disease on dialysis (HCC)     LUE fistula  . Type I diabetes mellitus (HCC)     a. 03/2014 admitted with HNK to [facility]. b. TTS  . Diabetic neuropathy (HCC)     severe, s/p multiple toe amputation  . Hypothyroidism   . Hypertension   . Hyperlipidemia   . H/O hiatal hernia   . GERD (gastroesophageal reflux disease)   . Anxiety   . Sebaceous cyst     side of neck  . Pneumonia     2010  . Anemia     a. req PRBC's 2011.  [name] PAD (peripheral artery disease) (HCC)     a. s/p amputation of toes on the right;  b. left LE claudication.  . Coronary artery disease     a. s/p MI;  b. 10/2009 CABG x 3 @ [facility]: LIMA->LAD, VG->OM3, VG->RPDA; c. 11/2010 Cath 3/3 patent grafts;  d 12/2012 Cath: LM 30d, LAD 85p, D1 70, D2 90, LCX 40ost, OM2 100, RCA 90p, 118m, L->LAD ok, VG->OM3 ok, VG->RPDA 30, EF 50%->Med Rx.  . Cataract     right  . Valvular disease     a. 11/2012 Echo: EF 55-60%, mild LVH, mild MR, mild bi-atrial enlargement, mild-mod TR, PASP ; b. echo 03/2014: EF 50-55%, nl WM, select images concerning for bicuspid aortic valve w/ nl thicknes of leaflets, mild MR, mild bi-atrial dilatation, RV mild  dilatation - wall thickness nl, mod TR - select images appears to be mod to sev, PASP at least mod elevated.  . Diastolic CHF (HCC)     see echo above  . Depression   . Arthritis     rheumatoid arthritis   . Myocardial infarction (HCC) 2010  . Pulmonary HTN (HCC)     a. continuation of 03/2014 echo PASP @ least mod elevated. RVSP 52 mm Hg. Parasternal long axis estimated @ 85 mm Hg  . Scrotal abscess     a. s/p multiple I&D  . Penile abscess     a. s/p multiple I&D  . Hematemesis     a. EGD 2016 with LA Grade C esophagitis, continued on Protonix   . Chronic respiratory failure (HCC)     a. on 3L oxygen via nasal cannula  . Anxiety   . Acute delirium   . Sepsis (HCC)   . 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 MYOCARDIAL INFARCTION, HX OF 01/26/2011    Qualifier: Diagnosis of  By: [name] MD, [name]    . HTN (hypertension) 02/11/2011  . Pulmonary hypertension associated with end stage renal disease on dialysis (HCC) 04/16/2014  .  Hyperkalemia   . Hypothyroidism   . Osteomyelitis [facility])     Past Surgical History  Procedure Laterality Date  . Dialysis fistula creation      left upper arm fistula  . Amputation      TOES ON BOTH FEET  . Abscess drainage  Behind right ear/occipital scalp  . Skin graft    . Eye surgery  1999  . Coronary artery bypass graft  10/2009    [facility] (Dr. Katrinka Blazing)  . Foot amputation    . Cardiac catheterization    . Cardiac catheterization  2/14    [facility]: severe 3 vessel CAD with patent grafts, RHC: moderately elevated PCW and pulmonary hypertension  . Penile prosthesis implant N/A 07/24/2014    Procedure: SALINE PENILE INJECTION WITH DISSECTION OF CORPORA ,  IMPLANTATION OF COLOPLAST PENILE PROTHESIS INFLATABLE;  Surgeon: [name], MD;  Location: [facility] ORS;  Service: Urology;  Laterality: N/A;  . Removal of penile prosthesis N/A 08/22/2014    Procedure: EXPLANT OF INFECTED  PENILE PROSTHESIS;  Surgeon: [name], MD;  Location: [facility] ORS;  Service:  Urology;  Laterality: N/A;  . Irrigation and debridement abscess Bilateral 12/14/2014    Procedure: Bilateral Corporal Irrigation with Cultures and Drainage, Penrose Drain Insertion;  Surgeon: [name], MD;  Location: [facility] OR;  Service: Urology;  Laterality: Bilateral;  . Scrotal exploration N/A 02/03/2015    Procedure: IRRIGATION AND DEBRIDEMENT SCROTAL/PENILE ABSCESS;  Surgeon: [name], MD;  Location: [facility] OR;  Service: Urology;  Laterality: N/A;  . Cystoscopy N/A 02/03/2015    Procedure: CYSTOSCOPY;  Surgeon: [name], MD;  Location: [facility] OR;  Service: Urology;  Laterality: N/A;  . Esophagogastroduodenoscopy N/A 03/18/2015    Procedure: ESOPHAGOGASTRODUODENOSCOPY (EGD);  Surgeon: [name], MD;  Location: [facility] ENDOSCOPY;  Service: Endoscopy;  Laterality: N/A;  . Cardiac catheterization N/A 05/27/2015    Procedure: Right Heart Cath;  Surgeon: [name], MD;  Location: [facility] INVASIVE CV LAB;  Service: Cardiovascular;  Laterality: N/A;    Family History  Problem Relation Age of Onset  . Heart disease Other   . Hypertension Other   . Hypertension Mother   . Heart disease Mother   . Diabetes Mother   . Birth defects Paternal Uncle     unaware  . Birth defects Paternal Grandmother     breast  . Kidney disease Neg Hx   . Prostate cancer Neg Hx     Social History Social History  Substance Use Topics  . Smoking status: Never Smoker   . Smokeless tobacco: Former Neurosurgeon    Types: Chew    Quit date: 08/23/1995  . Alcohol Use: No     Comment: occasional beer    Allergies  Allergen Reactions  . Amoxicillin-Pot Clavulanate Nausea And Vomiting and Itching  . Contrast Media [Iodinated Diagnostic Agents] Other (See Comments)    Unknown reaction  . 2nd Skin Quick Heal Other (See Comments)  . Rifampin Nausea And Vomiting  . Tape Other (See Comments)    Current Facility-Administered Medications  Medication Dose Route Frequency Provider Last Rate Last Dose  .  0.9 %  sodium chloride infusion   Intravenous Once Gracelyn Nurse, MD      . acetaminophen (TYLENOL) tablet 650 mg  650 mg Oral Q6H PRN Enedina Finner, MD       Or  . acetaminophen (TYLENOL) suppository 650 mg  650 mg Rectal Q6H PRN Enedina Finner, MD      .  acetaminophen (TYLENOL) tablet 650 mg  650 mg Oral Q6H PRN Enedina Finner, MD      . albuterol (PROVENTIL) (2.5 MG/3ML) 0.083% nebulizer solution 2.5 mg  2.5 mg Nebulization Q6H PRN Enedina Finner, MD      . albuterol (PROVENTIL) (2.5 MG/3ML) 0.083% nebulizer solution 2.5 mg  2.5 mg Nebulization Q6H PRN Enedina Finner, MD      . ampicillin (OMNIPEN) 2 g in sodium chloride 0.9 % 50 mL IVPB  2 g Intravenous Q12H Cy Blamer, RPH   2 g at 04/06/16 1043  . calcium-vitamin D (OSCAL WITH D) 500-200 MG-UNIT per tablet 1 tablet  1 tablet Oral Q breakfast Enedina Finner, MD   1 tablet at 04/06/16 0840  . cinacalcet (SENSIPAR) tablet 30 mg  30 mg Oral Q supper Enedina Finner, MD   30 mg at 04/06/16 1732  . gentamicin (GARAMYCIN) 80 mg in sodium chloride irrigation 0.9 % 500 mL irrigation   Irrigation BID Crist Fat, MD      . heparin injection 5,000 Units  5,000 Units Subcutaneous Q8H Enedina Finner, MD   5,000 Units at 04/06/16 1434  . insulin aspart (novoLOG) injection 0-15 Units  0-15 Units Subcutaneous TID WC Gracelyn Nurse, MD   3 Units at 04/06/16 1732  . insulin aspart (novoLOG) injection 0-5 Units  0-5 Units Subcutaneous QHS Gracelyn Nurse, MD   2 Units at 04/05/16 2220  . levothyroxine (SYNTHROID, LEVOTHROID) tablet 100 mcg  100 mcg Oral QHS Enedina Finner, MD   100 mcg at 04/05/16 2219  . liothyronine (CYTOMEL) tablet 10 mcg  10 mcg Oral Daily Enedina Finner, MD   10 mcg at 04/06/16 1045  . meropenem (MERREM) 500 mg in sodium chloride 0.9 % 50 mL IVPB  500 mg Intravenous Daily Cy Blamer, RPH   500 mg at 04/06/16 1102  . midodrine (PROAMATINE) tablet 10 mg  10 mg Oral Daily PRN Enedina Finner, MD      . morphine 2 MG/ML injection 1 mg  1 mg Intravenous QID PRN  Enedina Finner, MD   1 mg at 04/04/16 2325  . omega-3 acid ethyl esters (LOVAZA) capsule 1 g  1 g Oral Daily Enedina Finner, MD   1 g at 04/06/16 1044  . ondansetron (ZOFRAN) tablet 4 mg  4 mg Oral Q6H PRN Enedina Finner, MD       Or  . ondansetron (ZOFRAN) injection 4 mg  4 mg Intravenous Q6H PRN Enedina Finner, MD      . pantoprazole (PROTONIX) EC tablet 40 mg  40 mg Oral BID Enedina Finner, MD   40 mg at 04/06/16 1044  . pregabalin (LYRICA) capsule 75 mg  75 mg Oral BID Enedina Finner, MD   75 mg at 04/06/16 1044  . rosuvastatin (CRESTOR) tablet 10 mg  10 mg Oral QHS Enedina Finner, MD   10 mg at 04/05/16 2219  . sodium hypochlorite (DAKIN'S 1/4 STRENGTH) topical solution   Irrigation BID Gracelyn Nurse, MD   60 application at 04/06/16 1542  . traMADol (ULTRAM) tablet 50 mg  50 mg Oral Q8H PRN Enedina Finner, MD      . vancomycin (VANCOCIN) 50 mg/mL oral solution 125 mg  125 mg Oral QID Randall Hiss, MD   125 mg at 04/06/16 1732     Review of Systems ROS is unobtainable due to altered mental status  Physical Exam Blood pressure 106/51, pulse 102, temperature 97.6 F (36.4 C), temperature  source Oral, resp. rate 20, height 5\' 10"  (1.778 m), weight 83.099 kg (183 lb 3.2 oz), SpO2 100 %. CONSTITUTIONAL: debilitated and malnourished male, disoriented to place and time. EYES: Pupils are equal, round, and reactive to light, Sclera are non-icteric. EARS, NOSE, MOUTH AND THROAT: The oropharynx is clear. The oral mucosa is pink and moist. Hearing is intact to voice. LYMPH NODES:  Lymph nodes in the neck are normal. RESPIRATORY:  Lungs are clear. There is normal respiratory effort, with equal breath sounds bilaterally, and without pathologic use of accessory muscles. CARDIOVASCULAR: Heart is regular without murmurs, gallops, or rubs. GI: The abdomen is soft, nontender, and nondistended. There are no palpable masses. There is no hepatosplenomegaly. There are normal bowel sounds in all quadrants. GU: Rectal no masses    MUSCULOSKELETAL: Normal muscle strength and tone. No cyanosis or edema.   SKIN: Stage IV sacral decubitus ulcer w necrotic base, bone exposed. No evidence of necrotizing infection or abscess. This measures 12 x 12 cms , foul smelling. There is an additional Left intertrochanteric stage IV ulcer w necrotic base.  Data Reviewed  I have personally reviewed the patient's imaging, laboratory findings and medical records.    Assessment/  Plan Stage IV decubitus ulcer on a pt w multiple medical issues on dual antiplatelets and Hb of 7. Percent do not think that he is septic from this he has other potential sources including UTI C. difficile and bacteremia. We'll need to hold his antiplatelet therapy because he will need extensive debridement and possible wound VAC placement. I will like his hemoglobin to be at least above 8 before surgical procedure is done. Dr. who is my partner will be here and tomorrow and all through this week and and he will take over the case.  Tentatively can be done towards the end of the week on Thursday or Friday.No need for immediate surgical attention. Discussed in detail with Dr. Sunday and family. Extensive counseling provided For now continue local wound care dakins and duoderm. Optimize nutritional support and rotate the pt q 2 hrs.  Difficult situation and likely will have a chronic wound for a long time Letitia Libra, MD FACS General Surgeon 04/06/2016, 6:29 PM

## 2016-04-06 NOTE — Progress Notes (Signed)
Refused lunch.  Says he's not hungry.  Also didn't eat breakfast.  Family may bring in food that he likes - they did yesterday.

## 2016-04-06 NOTE — Progress Notes (Signed)
Subjective: Patient is a poor historian. Renal US shows no hydronephrosis and the SP tube in the correct position. He continues to have mild suprapubic pain. SP tube irrigation yielded cloudy purulent material.  Objective: Vital signs in last 24 hours: Temp:  [97.6 F (36.4 C)-99.7 F (37.6 C)] 97.6 F (36.4 C) (05/29 1212) Pulse Rate:  [102-106] 102 (05/29 1212) Resp:  [14-20] 20 (05/29 1212) BP: (104-106)/(47-51) 106/51 mmHg (05/29 1212) SpO2:  [99 %-100 %] 100 % (05/29 1212)  Intake/Output from previous day: 05/28 0701 - 05/29 0700 In: 1110 [I.V.:1010; IV Piggyback:100] Out: 0  Intake/Output this shift:    Physical Exam:  General:appears older than stated age and no distress GI: tenderness: suprapubic, mild. Mild erythema around SP tube site Male genitalia: not done Extremities: extremities normal, atraumatic, no cyanosis or edema  Lab Results:  Recent Labs  04/04/16 1719 04/05/16 0712  HGB 7.6* 7.0*  HCT 25.0* 23.2*   BMET  Recent Labs  04/04/16 1719 04/05/16 0712  NA 134* 130*  K 3.2* 3.4*  CL 93* 93*  CO2 30 25  GLUCOSE 195* 268*  BUN 15 18  CREATININE 1.93* 2.18*  CALCIUM 7.8* 7.4*   No results for input(s): LABPT, INR in the last 72 hours. No results for input(s): LABURIN in the last 72 hours. Results for orders placed or performed during the hospital encounter of 04/04/16  Urine culture     Status: Abnormal   Collection Time: 04/04/16  5:16 PM  Result Value Ref Range Status   Specimen Description URINE, RANDOM  Final   Special Requests NONE  Final   Culture MULTIPLE SPECIES PRESENT, SUGGEST RECOLLECTION (A)  Final   Report Status 04/06/2016 FINAL  Final  Blood Culture (routine x 2)     Status: Abnormal (Preliminary result)   Collection Time: 04/04/16  5:19 PM  Result Value Ref Range Status   Specimen Description BLOOD RIGHT HAND  Final   Special Requests BOTTLES DRAWN AEROBIC AND ANAEROBIC  6CC  Final   Culture  Setup Time   Final    GRAM  NEGATIVE RODS GRAM POSITIVE COCCI IN PAIRS IN CHAINS IN BOTH AEROBIC AND ANAEROBIC BOTTLES CRITICAL RESULT CALLED TO, READ BACK BY AND VERIFIED WITH: ALLISON THARAKAN AT 0825 ON 04/05/16. CTJ    Culture (A)  Final    ESCHERICHIA COLI ENTEROCOCCUS SPECIES IN BOTH AEROBIC AND ANAEROBIC BOTTLES SUSCEPTIBILITIES TO FOLLOW ONCE INCUBATED FOR BETTER GROWTH.    Report Status PENDING  Incomplete  Blood Culture (routine x 2)     Status: Abnormal (Preliminary result)   Collection Time: 04/04/16  5:19 PM  Result Value Ref Range Status   Specimen Description BLOOD RIGHT FOREARM  Final   Special Requests BOTTLES DRAWN AEROBIC AND ANAEROBIC  5CC  Final   Culture  Setup Time   Final    GRAM NEGATIVE RODS AEROBIC BOTTLE ONLY CRITICAL RESULT CALLED TO, READ BACK BY AND VERIFIED WITH: ALLISON THARAKAN AT 0825 ON 04/05/16. CTJ GRAM POSITIVE COCCI GRAM POSITIVE RODS ANAEROBIC BOTTLE ONLY CRITICAL RESULT CALLED TO, READ BACK BY AND VERIFIED WITH: ALLISON THARAKAN ON 04/05/16 AT 1130 BY QSD    Culture (A)  Final    ESCHERICHIA COLI SUSCEPTIBILITIES TO FOLLOW GRAM POSITIVE COCCI GRAM POSITIVE RODS IDENTIFICATION TO FOLLOW    Report Status PENDING  Incomplete  Blood Culture ID Panel (Reflexed)     Status: Abnormal   Collection Time: 04/04/16  5:19 PM  Result Value Ref Range Status   Enterococcus  species DETECTED (A) NOT DETECTED Final    Comment: CRITICAL RESULT CALLED TO, READ BACK BY AND VERIFIED WITH: ALLISON THARAKAN FOR ENTEROCOCCUS, ENTEROBACTERIACEAE AND E. COLI AT 0825 ON 04/05/16. CTJ    Vancomycin resistance NOT DETECTED NOT DETECTED Final   Listeria monocytogenes NOT DETECTED NOT DETECTED Final   Staphylococcus species NOT DETECTED NOT DETECTED Final   Staphylococcus aureus NOT DETECTED NOT DETECTED Final   Methicillin resistance NOT DETECTED NOT DETECTED Final   Streptococcus species NOT DETECTED NOT DETECTED Final   Streptococcus agalactiae NOT DETECTED NOT DETECTED Final    Streptococcus pneumoniae NOT DETECTED NOT DETECTED Final   Streptococcus pyogenes NOT DETECTED NOT DETECTED Final   Acinetobacter baumannii NOT DETECTED NOT DETECTED Final   Enterobacteriaceae species DETECTED (A) NOT DETECTED Final   Enterobacter cloacae complex NOT DETECTED NOT DETECTED Final   Escherichia coli DETECTED (A) NOT DETECTED Final   Klebsiella oxytoca NOT DETECTED NOT DETECTED Final   Klebsiella pneumoniae NOT DETECTED NOT DETECTED Final   Proteus species NOT DETECTED NOT DETECTED Final   Serratia marcescens NOT DETECTED NOT DETECTED Final   Carbapenem resistance NOT DETECTED NOT DETECTED Final   Haemophilus influenzae NOT DETECTED NOT DETECTED Final   Neisseria meningitidis NOT DETECTED NOT DETECTED Final   Pseudomonas aeruginosa NOT DETECTED NOT DETECTED Final   Candida albicans NOT DETECTED NOT DETECTED Final   Candida glabrata NOT DETECTED NOT DETECTED Final   Candida krusei NOT DETECTED NOT DETECTED Final   Candida parapsilosis NOT DETECTED NOT DETECTED Final   Candida tropicalis NOT DETECTED NOT DETECTED Final  C difficile quick scan w PCR reflex     Status: Abnormal   Collection Time: 04/05/16 11:57 AM  Result Value Ref Range Status   C Diff antigen POSITIVE (A) NEGATIVE Final   C Diff toxin NEGATIVE NEGATIVE Final   C Diff interpretation   Final    Positive for toxigenic C. difficile, active toxin production not detected. Patient has toxigenic C. difficile organisms present in the bowel, but toxin was not detected. The patient may be a carrier or the level of toxin in the sample was below the limit  of detection. This information should be used in conjunction with the patient's clinical history when deciding on possible therapy.     Comment: CRITICAL RESULT CALLED TO, READ BACK BY AND VERIFIED WITH: TAYLOR BECK @ 1410 04/05/16 BY [facility]   Clostridium Difficile by PCR     Status: Abnormal   Collection Time: 04/05/16 11:57 AM  Result Value Ref Range Status    Toxigenic C Difficile by pcr POSITIVE (A) NEGATIVE Final    Comment: CRITICAL RESULT CALLED TO, READ BACK BY AND VERIFIED WITH: TAYLOR BECK @ 1410 04/05/16 BY [facility]   MRSA PCR Screening     Status: None   Collection Time: 04/05/16 10:20 PM  Result Value Ref Range Status   MRSA by PCR NEGATIVE NEGATIVE Final    Comment:        The GeneXpert MRSA Assay (FDA approved for NASAL specimens only), is one component of a comprehensive MRSA colonization surveillance program. It is not intended to diagnose MRSA infection nor to guide or monitor treatment for MRSA infections.     Studies/Results: US Renal  04/06/2016  CLINICAL DATA:  History of urinary retention. EXAM: RENAL / URINARY TRACT ULTRASOUND COMPLETE COMPARISON:  CT of 02/20/2015 FINDINGS: Right Kidney: Length: 9.6 cm. No hydronephrosis. Renal cortical thinning and increased echogenicity. Left Kidney: Length: 9.0 cm. No hydronephrosis. Renal cortical  thinning and increased echogenicity. Bladder: Collapsed around a Foley catheter. Incidental note is made of large amount of ascites. Bilateral pleural effusions. Splenomegaly, with splenic volume of 680 cc. IMPRESSION: 1. Bladder collapsed around a Foley catheter. 2. Medical renal disease, without hydronephrosis. 3. Ascites and bilateral pleural effusions, suggesting fluid overload. 4. Splenomegaly. Electronically Signed   By: Jeronimo Greaves M.D.   On: 04/06/2016 10:42    Assessment/Plan: 45yo with ESRD, oliguric and pyocystis  1. Continue SP tube antibiotic irrigations BID with 24ml normal saline and please add gentamicin to the irrigation. Please continue irrigations until discharge.     LOS: 2 days   Wilkie Aye 04/06/2016, 8:35 PM

## 2016-04-06 NOTE — Plan of Care (Signed)
Problem: Education: Goal: Knowledge of  General Education information/materials will improve Due to cognitive status patient does not seem capable of participating in education.

## 2016-04-06 NOTE — Progress Notes (Signed)
Sacral and buttock dressing changed.  Gauze wetted with dakin applied directly to wounds.  Covered with ABD.  Patient transferred to a rotating air mattress bed without difficulty.

## 2016-04-06 NOTE — Progress Notes (Signed)
Subjective: Admitted with sepsis. Mildly confused but improved today.  Objective: Vital signs in last 24 hours: Temp:  [97.6 F (36.4 C)-99.7 F (37.6 C)] 97.6 F (36.4 C) (05/29 1212) Pulse Rate:  [102-106] 102 (05/29 1212) Resp:  [14-20] 20 (05/29 1212) BP: (104-120)/(47-58) 106/51 mmHg (05/29 1212) SpO2:  [99 %-100 %] 100 % (05/29 1212) Weight change:  Last BM Date: 04/06/16  Intake/Output from previous day: 05/28 0701 - 05/29 0700 In: 1110 [I.V.:1010; IV Piggyback:100] Out: 0  Intake/Output this shift: Total I/O In: 100 [IV Piggyback:100] Out: -   General appearance: cooperative, no distress and . Head: Normocephalic, without obvious abnormality, atraumatic Resp: clear to auscultation bilaterally and normal percussion bilaterally Cardio: regular rate and rhythm and no rub GI: soft, non-tender; bowel sounds normal; no masses,  no organomegaly Pelvic: Suprapubic cath in place.  Extremities: edema lower ext Skin: large stage 4 sacral decubitis ulcer Neurologic: Grossly normal  Lab Results:  Recent Labs  04/04/16 1719 04/05/16 0712  WBC 27.8* 22.9*  HGB 7.6* 7.0*  HCT 25.0* 23.2*  PLT 163 127*   BMET  Recent Labs  04/04/16 1719 04/05/16 0712  NA 134* 130*  K 3.2* 3.4*  CL 93* 93*  CO2 30 25  GLUCOSE 195* 268*  BUN 15 18  CREATININE 1.93* 2.18*  CALCIUM 7.8* 7.4*    Studies/Results: US Renal  04/06/2016  CLINICAL DATA:  History of urinary retention. EXAM: RENAL / URINARY TRACT ULTRASOUND COMPLETE COMPARISON:  CT of 02/20/2015 FINDINGS: Right Kidney: Length: 9.6 cm. No hydronephrosis. Renal cortical thinning and increased echogenicity. Left Kidney: Length: 9.0 cm. No hydronephrosis. Renal cortical thinning and increased echogenicity. Bladder: Collapsed around a Foley catheter. Incidental note is made of large amount of ascites. Bilateral pleural effusions. Splenomegaly, with splenic volume of 680 cc. IMPRESSION: 1. Bladder collapsed around a Foley  catheter. 2. Medical renal disease, without hydronephrosis. 3. Ascites and bilateral pleural effusions, suggesting fluid overload. 4. Splenomegaly. Electronically Signed   By: Jeronimo Greaves M.D.   On: 04/06/2016 10:42   Dg Chest Port 1 View  04/04/2016  CLINICAL DATA:  Altered mental status EXAM: PORTABLE CHEST 1 VIEW COMPARISON:  None. FINDINGS: Cardiac shadow is at the upper limits of normal in size. Postsurgical changes are again seen. Mild vascular congestion is noted with mild interstitial edema. No focal infiltrate or sizable effusion is noted. IMPRESSION: Mild CHF. Electronically Signed   By: Alcide Clever M.D.   On: 04/04/2016 17:44    Medications:  Scheduled: . ampicillin (OMNIPEN) IV  2 g Intravenous Q12H  . aspirin EC  81 mg Oral Daily  . calcium-vitamin D  1 tablet Oral Q breakfast  . cinacalcet  30 mg Oral Q supper  . clopidogrel  75 mg Oral Daily  . gentamicin irrigation   Irrigation BID  . heparin  5,000 Units Subcutaneous Q8H  . insulin aspart  0-15 Units Subcutaneous TID WC  . insulin aspart  0-5 Units Subcutaneous QHS  . levothyroxine  100 mcg Oral QHS  . liothyronine  10 mcg Oral Daily  . meropenem (MERREM) IV  500 mg Intravenous Daily  . metroNIDAZOLE  500 mg Oral Q8H  . omega-3 acid ethyl esters  1 g Oral Daily  . pantoprazole  40 mg Oral BID  . pregabalin  75 mg Oral BID  . rosuvastatin  10 mg Oral QHS  . sodium hypochlorite   Irrigation BID   Continuous:    Assessment/Plan: 1. Sepsis: Secondary to UTI. On  IV abx. Continue supportive care. ID is concerned patient is at risk for endocarditis. At this point to continue treatment that he is on that will cover it and differ doing a TEE for now. 2. UTI: Culture growing e.coli and enterococus. Sensativities pending. 3. Bacteremia: Growing same as above. On IV abx. 4. ESRD: HD per nephrology. 5. Sacral Decubitis Ulcer: Present on admission. Wound care has placed on air mattres. Likely needs debridement. 6. Severe  Spinal Stenosis: Appears comfortable. 7. Suprapubic cath: Cath changed yesterday by urology. Now getting gentamicin flushes.  Time spent 30 min   LOS: 2 days   Gracelyn Nurse 04/06/2016, 3:44 PM

## 2016-04-06 NOTE — Care Management (Signed)
Patient from [facility]. On chronic 3L and dialysis. CSW following.

## 2016-04-07 DIAGNOSIS — Z87448 Personal history of other diseases of urinary system: Secondary | ICD-10-CM

## 2016-04-07 DIAGNOSIS — A419 Sepsis, unspecified organism: Secondary | ICD-10-CM

## 2016-04-07 LAB — GLUCOSE, CAPILLARY
GLUCOSE-CAPILLARY: 80 mg/dL (ref 65–99)
Glucose-Capillary: 187 mg/dL — ABNORMAL HIGH (ref 65–99)
Glucose-Capillary: 216 mg/dL — ABNORMAL HIGH (ref 65–99)
Glucose-Capillary: 121 mg/dL — ABNORMAL HIGH (ref 65–99)

## 2016-04-07 LAB — C-REACTIVE PROTEIN: CRP: 20.5 mg/dL — AB (ref <1.0)

## 2016-04-07 LAB — CBC
HEMATOCRIT: 22.4 % — AB (ref 40.0–52.0)
Hemoglobin: 6.9 g/dL — ABNORMAL LOW (ref 13.0–18.0)
MCH: 27.2 pg (ref 26.0–34.0)
MCHC: 30.7 g/dL — ABNORMAL LOW (ref 32.0–36.0)
MCV: 88.7 fL (ref 80.0–100.0)
PLATELETS: 107 10*3/uL — AB (ref 150–440)
RBC: 2.52 MIL/uL — AB (ref 4.40–5.90)
RDW: 18.2 % — AB (ref 11.5–14.5)
WBC: 20.5 10*3/uL — AB (ref 3.8–10.6)

## 2016-04-07 LAB — SEDIMENTATION RATE: Sed Rate: 93 mm/hr — ABNORMAL HIGH (ref 0–15)

## 2016-04-07 MED ORDER — EPOETIN ALFA 10000 UNIT/ML IJ SOLN
10000.0000 [IU] | INTRAMUSCULAR | Status: DC
  Administered 2016-04-07 – 2016-04-11 (×2): 10000 [IU] via INTRAVENOUS
  Filled 2016-04-07 (×2): qty 1

## 2016-04-07 NOTE — Progress Notes (Signed)
[facility]  ROUNDING NOTE   Subjective:   Hemodialysis for later today. Lethargic, sleeping in bed.  WBC 20.5 (22.9)  Na 130   Objective:  Vital signs in last 24 hours:  Temp:  [97.6 F (36.4 C)-98.1 F (36.7 C)] 97.6 F (36.4 C) (05/30 0806) Pulse Rate:  [101-104] 101 (05/30 0806) Resp:  [16-20] 16 (05/30 0806) BP: (106-118)/(51-59) 118/59 mmHg (05/30 0806) SpO2:  [100 %] 100 % (05/30 0806)  Weight change:  Filed Weights   04/04/16 1702 04/04/16 2142  Weight: 84.3 kg (185 lb 13.6 oz) 83.099 kg (183 lb 3.2 oz)    Intake/Output: I/O last 3 completed shifts: In: 100 [IV Piggyback:100] Out: 0    Intake/Output this shift:     Physical Exam: General: Lethargic but arousable   Head: Normocephalic, atraumatic. Moist oral mucosal membranes  Eyes: Anicteric  Neck: Supple, trachea midline  Lungs:  Clear to auscultation normal effort  Heart: S1S2 no rubs  Abdomen:  Soft, nontender, BS present   Extremities: 2+ peripheral edema.  Neurologic: Lethargic but arousable, confused  Skin: Dry skin noted on bilateral lower extremities   Access: LUE AVF    Basic Metabolic Panel:  Recent Labs Lab 04/04/16 1719 04/05/16 0712  NA 134* 130*  K 3.2* 3.4*  CL 93* 93*  CO2 30 25  GLUCOSE 195* 268*  BUN 15 18  CREATININE 1.93* 2.18*  CALCIUM 7.8* 7.4*    Liver Function Tests:  Recent Labs Lab 04/04/16 1719  AST 16  ALT 8*  ALKPHOS 431*  BILITOT 1.4*  PROT 5.6*  ALBUMIN 1.5*   No results for input(s): LIPASE, AMYLASE in the last 168 hours. No results for input(s): AMMONIA in the last 168 hours.  CBC:  Recent Labs Lab 04/04/16 1719 04/05/16 0712 04/07/16 0447  WBC 27.8* 22.9* 20.5*  NEUTROABS 26.9*  --   --   HGB 7.6* 7.0* 6.9*  HCT 25.0* 23.2* 22.4*  MCV 88.6 88.8 88.7  PLT 163 127* 107*    Cardiac Enzymes:  Recent Labs Lab 04/04/16 1719  TROPONINI 0.07*    BNP: Invalid input(s): POCBNP  CBG:  Recent Labs Lab  04/06/16 0730 04/06/16 1209 04/06/16 1707 04/06/16 2301 04/07/16 0728  GLUCAP 198* 181* 152* 152* 187*    Microbiology: Results for orders placed or performed during the hospital encounter of 04/04/16  Urine culture     Status: Abnormal   Collection Time: 04/04/16  5:16 PM  Result Value Ref Range Status   Specimen Description URINE, RANDOM  Final   Special Requests NONE  Final   Culture MULTIPLE SPECIES PRESENT, SUGGEST RECOLLECTION (A)  Final   Report Status 04/06/2016 FINAL  Final  Blood Culture (routine x 2)     Status: Abnormal (Preliminary result)   Collection Time: 04/04/16  5:19 PM  Result Value Ref Range Status   Specimen Description BLOOD RIGHT HAND  Final   Special Requests BOTTLES DRAWN AEROBIC AND ANAEROBIC  6CC  Final   Culture  Setup Time   Final    GRAM NEGATIVE RODS GRAM POSITIVE COCCI IN PAIRS IN CHAINS IN BOTH AEROBIC AND ANAEROBIC BOTTLES CRITICAL RESULT CALLED TO, READ BACK BY AND VERIFIED WITH: ALLISON THARAKAN AT 0825 ON 04/05/16. CTJ    Culture (A)  Final    ESCHERICHIA COLI ENTEROCOCCUS SPECIES IN BOTH AEROBIC AND ANAEROBIC BOTTLES SUSCEPTIBILITIES TO FOLLOW ONCE INCUBATED FOR BETTER GROWTH.    Report Status PENDING  Incomplete  Blood Culture (routine x 2)  Status: Abnormal (Preliminary result)   Collection Time: 04/04/16  5:19 PM  Result Value Ref Range Status   Specimen Description BLOOD RIGHT FOREARM  Final   Special Requests BOTTLES DRAWN AEROBIC AND ANAEROBIC  5CC  Final   Culture  Setup Time   Final    GRAM NEGATIVE RODS AEROBIC BOTTLE ONLY CRITICAL RESULT CALLED TO, READ BACK BY AND VERIFIED WITH: ALLISON THARAKAN AT 0825 ON 04/05/16. CTJ GRAM POSITIVE COCCI GRAM POSITIVE RODS ANAEROBIC BOTTLE ONLY CRITICAL RESULT CALLED TO, READ BACK BY AND VERIFIED WITH: ALLISON THARAKAN ON 04/05/16 AT 1130 BY QSD    Culture (A)  Final    ESCHERICHIA COLI SUSCEPTIBILITIES TO FOLLOW GRAM POSITIVE COCCI GRAM POSITIVE RODS IDENTIFICATION TO FOLLOW     Report Status PENDING  Incomplete  Blood Culture ID Panel (Reflexed)     Status: Abnormal   Collection Time: 04/04/16  5:19 PM  Result Value Ref Range Status   Enterococcus species DETECTED (A) NOT DETECTED Final    Comment: CRITICAL RESULT CALLED TO, READ BACK BY AND VERIFIED WITH: ALLISON THARAKAN FOR ENTEROCOCCUS, ENTEROBACTERIACEAE AND E. COLI AT 0825 ON 04/05/16. CTJ    Vancomycin resistance NOT DETECTED NOT DETECTED Final   Listeria monocytogenes NOT DETECTED NOT DETECTED Final   Staphylococcus species NOT DETECTED NOT DETECTED Final   Staphylococcus aureus NOT DETECTED NOT DETECTED Final   Methicillin resistance NOT DETECTED NOT DETECTED Final   Streptococcus species NOT DETECTED NOT DETECTED Final   Streptococcus agalactiae NOT DETECTED NOT DETECTED Final   Streptococcus pneumoniae NOT DETECTED NOT DETECTED Final   Streptococcus pyogenes NOT DETECTED NOT DETECTED Final   Acinetobacter baumannii NOT DETECTED NOT DETECTED Final   Enterobacteriaceae species DETECTED (A) NOT DETECTED Final   Enterobacter cloacae complex NOT DETECTED NOT DETECTED Final   Escherichia coli DETECTED (A) NOT DETECTED Final   Klebsiella oxytoca NOT DETECTED NOT DETECTED Final   Klebsiella pneumoniae NOT DETECTED NOT DETECTED Final   Proteus species NOT DETECTED NOT DETECTED Final   Serratia marcescens NOT DETECTED NOT DETECTED Final   Carbapenem resistance NOT DETECTED NOT DETECTED Final   Haemophilus influenzae NOT DETECTED NOT DETECTED Final   Neisseria meningitidis NOT DETECTED NOT DETECTED Final   Pseudomonas aeruginosa NOT DETECTED NOT DETECTED Final   Candida albicans NOT DETECTED NOT DETECTED Final   Candida glabrata NOT DETECTED NOT DETECTED Final   Candida krusei NOT DETECTED NOT DETECTED Final   Candida parapsilosis NOT DETECTED NOT DETECTED Final   Candida tropicalis NOT DETECTED NOT DETECTED Final  C difficile quick scan w PCR reflex     Status: Abnormal   Collection Time: 04/05/16  11:57 AM  Result Value Ref Range Status   C Diff antigen POSITIVE (A) NEGATIVE Final   C Diff toxin NEGATIVE NEGATIVE Final   C Diff interpretation   Final    Positive for toxigenic C. difficile, active toxin production not detected. Patient has toxigenic C. difficile organisms present in the bowel, but toxin was not detected. The patient may be a carrier or the level of toxin in the sample was below the limit  of detection. This information should be used in conjunction with the patient's clinical history when deciding on possible therapy.     Comment: CRITICAL RESULT CALLED TO, READ BACK BY AND VERIFIED WITH: TAYLOR BECK @ 1410 04/05/16 BY [facility]   Clostridium Difficile by PCR     Status: Abnormal   Collection Time: 04/05/16 11:57 AM  Result Value Ref Range Status  Toxigenic C Difficile by pcr POSITIVE (A) NEGATIVE Final    Comment: CRITICAL RESULT CALLED TO, READ BACK BY AND VERIFIED WITH: TAYLOR BECK @ 1410 04/05/16 BY T[facility]   MRSA PCR Screening     Status: None   Collection Time: 04/05/16 10:20 PM  Result Value Ref Range Status   MRSA by PCR NEGATIVE NEGATIVE Final    Comment:        The GeneXpert MRSA Assay (FDA approved for NASAL specimens only), is one component of a comprehensive MRSA colonization surveillance program. It is not intended to diagnose MRSA infection nor to guide or monitor treatment for MRSA infections.     Coagulation Studies: No results for input(s): LABPROT, INR in the last 72 hours.  Urinalysis:  Recent Labs  04/04/16 1717  COLORURINE YELLOW*  LABSPEC 1.015  PHURINE 5.0  GLUCOSEU NEGATIVE  HGBUR 2+*  BILIRUBINUR NEGATIVE  KETONESUR NEGATIVE  PROTEINUR 100*  NITRITE NEGATIVE  LEUKOCYTESUR 2+*      Imaging: US Renal  04/06/2016  CLINICAL DATA:  History of urinary retention. EXAM: RENAL / URINARY TRACT ULTRASOUND COMPLETE COMPARISON:  CT of 02/20/2015 FINDINGS: Right Kidney: Length: 9.6 cm. No hydronephrosis. Renal cortical thinning and  increased echogenicity. Left Kidney: Length: 9.0 cm. No hydronephrosis. Renal cortical thinning and increased echogenicity. Bladder: Collapsed around a Foley catheter. Incidental note is made of large amount of ascites. Bilateral pleural effusions. Splenomegaly, with splenic volume of 680 cc. IMPRESSION: 1. Bladder collapsed around a Foley catheter. 2. Medical renal disease, without hydronephrosis. 3. Ascites and bilateral pleural effusions, suggesting fluid overload. 4. Splenomegaly. Electronically Signed   By: Jeronimo Greaves M.D.   On: 04/06/2016 10:42     Medications:     . sodium chloride   Intravenous Once  . ampicillin (OMNIPEN) IV  2 g Intravenous Q12H  . calcium-vitamin D  1 tablet Oral Q breakfast  . cinacalcet  30 mg Oral Q supper  . gentamicin irrigation   Irrigation BID  . heparin  5,000 Units Subcutaneous Q8H  . insulin aspart  0-15 Units Subcutaneous TID WC  . insulin aspart  0-5 Units Subcutaneous QHS  . levothyroxine  100 mcg Oral QHS  . liothyronine  10 mcg Oral Daily  . meropenem (MERREM) IV  500 mg Intravenous Daily  . omega-3 acid ethyl esters  1 g Oral Daily  . pantoprazole  40 mg Oral BID  . pregabalin  75 mg Oral BID  . rosuvastatin  10 mg Oral QHS  . sodium hypochlorite   Irrigation BID  . vancomycin  125 mg Oral QID   acetaminophen **OR** acetaminophen, acetaminophen, albuterol, albuterol, midodrine, morphine injection, ondansetron **OR** ondansetron (ZOFRAN) IV, traMADol  Assessment/ Plan:  45 y.o. 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 TTS  1. End-stage renal disease on hematemesis Tuesday, Thursday, Saturday.  Patient due for hemodialysis later today.   2. Sepsis.  Both Escherichia coli and enterococcusin blood culture. Suprapubic catheter changed 04/05/16. - gentamicin  irrigation of wounds - ampicillin, meropenem and PO vano. Will make sure this is the optimal regimen for patient.   3. Anemia of chronic kidney disease. Scheduled for PRBC transfusion today with hemodialysis treatment.  - epo with HD treatment  4. Secondary hyperparathyroidism. PTH 85 and phos 1.3 outpatient - discontinue cinacalcet.      LOS: 3  [place], Threasa Heads 5/30/201711:16 AM

## 2016-04-07 NOTE — Progress Notes (Signed)
TX Started 

## 2016-04-07 NOTE — Progress Notes (Signed)
Dialysis initiated in the room.  Not being transported to dialysis unit because of c-diff infection.

## 2016-04-07 NOTE — Progress Notes (Signed)
Urology Consult Follow Up  Subjective: Patient laying comfortably in bed.  He is a poor historian.  Staff states he was orientated during the night.  He is able to answer simple questions today.  No UOP.  Patient is being prepped for a decubitus debridement.    Anti-infectives: Anti-infectives    Start     Dose/Rate Route Frequency Ordered Stop   04/06/16 1800  vancomycin (VANCOCIN) 50 mg/mL oral solution 125 mg     125 mg Oral 4 times daily 04/06/16 1552 08-May-2016 1759   04/05/16 2200  gentamicin (GARAMYCIN) 80 mg in sodium chloride irrigation 0.9 % 500 mL irrigation      Irrigation 2 times daily 04/05/16 1546     04/05/16 1800  ampicillin (OMNIPEN) 2 g in sodium chloride 0.9 % 50 mL IVPB     2 g 150 mL/hr over 20 Minutes Intravenous Every 12 hours 04/05/16 1015     04/05/16 1800  metroNIDAZOLE (FLAGYL) tablet 500 mg  Status:  Discontinued     500 mg Oral Every 6 hours 04/05/16 1449 04/05/16 1554   04/05/16 1600  metroNIDAZOLE (FLAGYL) tablet 500 mg  Status:  Discontinued     500 mg Oral Every 8 hours 04/05/16 1554 04/06/16 1552   04/05/16 1030  meropenem (MERREM) 500 mg in sodium chloride 0.9 % 50 mL IVPB     500 mg 100 mL/hr over 30 Minutes Intravenous Daily 04/05/16 1015 04/10/16 0959   04/05/16 0500  piperacillin-tazobactam (ZOSYN) IVPB 3.375 g  Status:  Discontinued     3.375 g 12.5 mL/hr over 240 Minutes Intravenous Every 12 hours 04/04/16 2049 04/05/16 0842   04/04/16 2100  vancomycin (VANCOCIN) IVPB 750 mg/150 ml premix     750 mg 150 mL/hr over 60 Minutes Intravenous  Once 04/04/16 2047 04/05/16 0026   04/04/16 1730  piperacillin-tazobactam (ZOSYN) IVPB 3.375 g     3.375 g 100 mL/hr over 30 Minutes Intravenous  Once 04/04/16 1716 04/04/16 1847   04/04/16 1730  vancomycin (VANCOCIN) IVPB 1000 mg/200 mL premix     1,000 mg 200 mL/hr over 60 Minutes Intravenous  Once 04/04/16 1716 04/04/16 1847      Current Facility-Administered Medications  Medication Dose Route Frequency  Provider Last Rate Last Dose  . 0.9 %  sodium chloride infusion   Intravenous Once Gracelyn Nurse, MD      . acetaminophen (TYLENOL) tablet 650 mg  650 mg Oral Q6H PRN Enedina Finner, MD       Or  . acetaminophen (TYLENOL) suppository 650 mg  650 mg Rectal Q6H PRN Enedina Finner, MD      . acetaminophen (TYLENOL) tablet 650 mg  650 mg Oral Q6H PRN Enedina Finner, MD      . albuterol (PROVENTIL) (2.5 MG/3ML) 0.083% nebulizer solution 2.5 mg  2.5 mg Nebulization Q6H PRN Enedina Finner, MD      . albuterol (PROVENTIL) (2.5 MG/3ML) 0.083% nebulizer solution 2.5 mg  2.5 mg Nebulization Q6H PRN Enedina Finner, MD      . ampicillin (OMNIPEN) 2 g in sodium chloride 0.9 % 50 mL IVPB  2 g Intravenous Q12H Cy Blamer, RPH   2 g at 04/06/16 2237  . calcium-vitamin D (OSCAL WITH D) 500-200 MG-UNIT per tablet 1 tablet  1 tablet Oral Q breakfast Enedina Finner, MD   1 tablet at 04/06/16 0840  . cinacalcet (SENSIPAR) tablet 30 mg  30 mg Oral Q supper Enedina Finner, MD   30 mg at  04/06/16 1732  . gentamicin (GARAMYCIN) 80 mg in sodium chloride irrigation 0.9 % 500 mL irrigation   Irrigation BID Crist Fat, MD      . heparin injection 5,000 Units  5,000 Units Subcutaneous Q8H Enedina Finner, MD   5,000 Units at 04/07/16 0509  . insulin aspart (novoLOG) injection 0-15 Units  0-15 Units Subcutaneous TID WC Gracelyn Nurse, MD   3 Units at 04/06/16 1732  . insulin aspart (novoLOG) injection 0-5 Units  0-5 Units Subcutaneous QHS Gracelyn Nurse, MD   2 Units at 04/05/16 2220  . levothyroxine (SYNTHROID, LEVOTHROID) tablet 100 mcg  100 mcg Oral QHS Enedina Finner, MD   100 mcg at 04/06/16 2236  . liothyronine (CYTOMEL) tablet 10 mcg  10 mcg Oral Daily Enedina Finner, MD   10 mcg at 04/06/16 1045  . meropenem (MERREM) 500 mg in sodium chloride 0.9 % 50 mL IVPB  500 mg Intravenous Daily Cy Blamer, RPH   500 mg at 04/06/16 1102  . midodrine (PROAMATINE) tablet 10 mg  10 mg Oral Daily PRN Enedina Finner, MD      . morphine 2 MG/ML injection  1 mg  1 mg Intravenous QID PRN Enedina Finner, MD   1 mg at 04/04/16 2325  . omega-3 acid ethyl esters (LOVAZA) capsule 1 g  1 g Oral Daily Enedina Finner, MD   1 g at 04/06/16 1044  . ondansetron (ZOFRAN) tablet 4 mg  4 mg Oral Q6H PRN Enedina Finner, MD       Or  . ondansetron (ZOFRAN) injection 4 mg  4 mg Intravenous Q6H PRN Enedina Finner, MD      . pantoprazole (PROTONIX) EC tablet 40 mg  40 mg Oral BID Enedina Finner, MD   40 mg at 04/06/16 2237  . pregabalin (LYRICA) capsule 75 mg  75 mg Oral BID Enedina Finner, MD   75 mg at 04/06/16 2237  . rosuvastatin (CRESTOR) tablet 10 mg  10 mg Oral QHS Enedina Finner, MD   10 mg at 04/06/16 2237  . sodium hypochlorite (DAKIN'S 1/4 STRENGTH) topical solution   Irrigation BID Gracelyn Nurse, MD      . traMADol Janean Sark) tablet 50 mg  50 mg Oral Q8H PRN Enedina Finner, MD      . vancomycin (VANCOCIN) 50 mg/mL oral solution 125 mg  125 mg Oral QID Randall Hiss, MD   125 mg at 04/06/16 2335     Objective: Vital signs in last 24 hours: Temp:  [97.6 F (36.4 C)-98.1 F (36.7 C)] 98 F (36.7 C) (05/30 0522) Pulse Rate:  [101-104] 101 (05/30 0522) Resp:  [16-20] 18 (05/30 0522) BP: (106-113)/(51-56) 113/56 mmHg (05/30 0522) SpO2:  [100 %] 100 % (05/30 0522)  Intake/Output from previous day: 05/29 0701 - 05/30 0700 In: 100 [IV Piggyback:100] Out: 0  Intake/Output this shift:     Physical Exam Constitutional: Poorly nourished.   No acute distress.  Ashen complexion.   HEENT:  AT, moist mucus membranes. Trachea midline, no masses. Cardiovascular: No clubbing, cyanosis, or pitting edema to the thighs.   Respiratory: Normal respiratory effort, no increased work of breathing. GI: Abdomen is soft, non tender, distended, no abdominal masses.   GU: No CVA tenderness.  No bladder fullness or masses.  Patient with circumcised phallus. Penis is edematous.  Urethral meatus is patent.  Scant purulent discharge from the meatus when penis is squeezed. No penile lesions or  rashes. Scrotum with edema.  Testicles are located scrotally bilaterally. No masses are appreciated in the testicles. Left and right epididymis are normal. Skin: No rashes, bruises or suspicious lesions. Lymph: No cervical or inguinal adenopathy. Neurologic: Grossly intact, no focal deficits, moving all 4 extremities. Psychiatric: Normal mood and affect.  Lab Results:   Recent Labs  04/05/16 0712 04/07/16 0447  WBC 22.9* 20.5*  HGB 7.0* 6.9*  HCT 23.2* 22.4*  PLT 127* 107*   BMET  Recent Labs  04/04/16 1719 04/05/16 0712  NA 134* 130*  K 3.2* 3.4*  CL 93* 93*  CO2 30 25  GLUCOSE 195* 268*  BUN 15 18  CREATININE 1.93* 2.18*  CALCIUM 7.8* 7.4*   PT/INR No results for input(s): LABPROT, INR in the last 72 hours. ABG No results for input(s): PHART, HCO3 in the last 72 hours.  Invalid input(s): PCO2, PO2  Studies/Results: US Renal  04/06/2016  CLINICAL DATA:  History of urinary retention. EXAM: RENAL / URINARY TRACT ULTRASOUND COMPLETE COMPARISON:  CT of 02/20/2015 FINDINGS: Right Kidney: Length: 9.6 cm. No hydronephrosis. Renal cortical thinning and increased echogenicity. Left Kidney: Length: 9.0 cm. No hydronephrosis. Renal cortical thinning and increased echogenicity. Bladder: Collapsed around a Foley catheter. Incidental note is made of large amount of ascites. Bilateral pleural effusions. Splenomegaly, with splenic volume of 680 cc. IMPRESSION: 1. Bladder collapsed around a Foley catheter. 2. Medical renal disease, without hydronephrosis. 3. Ascites and bilateral pleural effusions, suggesting fluid overload. 4. Splenomegaly. Electronically Signed   By: Jeronimo Greaves M.D.   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 Please continue  irrigations until discharge.  Patient will need SPT exchange in one month with [facility].   Urology signing off.  Please contact us for questions.      LOS: 3 days    [facility] 04/07/2016

## 2016-04-07 NOTE — Progress Notes (Signed)
Transfusion completed, no adverse reactions noted. Pt stable

## 2016-04-07 NOTE — Progress Notes (Signed)
Tx ended    

## 2016-04-07 NOTE — Progress Notes (Signed)
Post HD  

## 2016-04-07 NOTE — Progress Notes (Signed)
CC: Sepsis and decubitus ulcer Subjective: This patient admitted for sepsis with urologic source as the likely identifiable source. He also has a decubitus ulcer. He is on aspirin and Plavix. Currently undergoing dialysis. He says he feels better and is apologizing to me for a routing episode that he had with someone else last night. He is not able to give a review of systems.  Objective: Vital signs in last 24 hours: Temp:  [97.6 F (36.4 C)-98.1 F (36.7 C)] 98.1 F (36.7 C) (05/30 1126) Pulse Rate:  [101-104] 103 (05/30 1126) Resp:  [16-18] 16 (05/30 1126) BP: (106-121)/(54-70) 121/70 mmHg (05/30 1126) SpO2:  [100 %] 100 % (05/30 1126) Last BM Date: 04/07/16  Intake/Output from previous day: 05/29 0701 - 05/30 0700 In: 100 [IV Piggyback:100] Out: 0  Intake/Output this shift:    Physical exam:  Awake alert not necessarily oriented currently on dialysis vital signs reviewed he looks pale.  Abdomen is soft and nontender Decubitus is dressed Lab Results: CBC   Recent Labs  04/05/16 0712 04/07/16 0447  WBC 22.9* 20.5*  HGB 7.0* 6.9*  HCT 23.2* 22.4*  PLT 127* 107*   BMET  Recent Labs  04/04/16 1719 04/05/16 0712  NA 134* 130*  K 3.2* 3.4*  CL 93* 93*  CO2 30 25  GLUCOSE 195* 268*  BUN 15 18  CREATININE 1.93* 2.18*  CALCIUM 7.8* 7.4*   PT/INR No results for input(s): LABPROT, INR in the last 72 hours. ABG No results for input(s): PHART, HCO3 in the last 72 hours.  Invalid input(s): PCO2, PO2  Studies/Results: US Renal  04/06/2016  CLINICAL DATA:  History of urinary retention. EXAM: RENAL / URINARY TRACT ULTRASOUND COMPLETE COMPARISON:  CT of 02/20/2015 FINDINGS: Right Kidney: Length: 9.6 cm. No hydronephrosis. Renal cortical thinning and increased echogenicity. Left Kidney: Length: 9.0 cm. No hydronephrosis. Renal cortical thinning and increased echogenicity. Bladder: Collapsed around a Foley catheter. Incidental note is made of large amount of ascites.  Bilateral pleural effusions. Splenomegaly, with splenic volume of 680 cc. IMPRESSION: 1. Bladder collapsed around a Foley catheter. 2. Medical renal disease, without hydronephrosis. 3. Ascites and bilateral pleural effusions, suggesting fluid overload. 4. Splenomegaly. Electronically Signed   By: Jeronimo Greaves M.D.   On: 04/06/2016 10:42    Anti-infectives: Anti-infectives    Start     Dose/Rate Route Frequency Ordered Stop   04/06/16 1800  vancomycin (VANCOCIN) 50 mg/mL oral solution 125 mg     125 mg Oral 4 times daily 04/06/16 1552 April 23, 2016 1759   04/05/16 2200  gentamicin (GARAMYCIN) 80 mg in sodium chloride irrigation 0.9 % 500 mL irrigation      Irrigation 2 times daily 04/05/16 1546     04/05/16 1800  ampicillin (OMNIPEN) 2 g in sodium chloride 0.9 % 50 mL IVPB     2 g 150 mL/hr over 20 Minutes Intravenous Every 12 hours 04/05/16 1015     04/05/16 1800  metroNIDAZOLE (FLAGYL) tablet 500 mg  Status:  Discontinued     500 mg Oral Every 6 hours 04/05/16 1449 04/05/16 1554   04/05/16 1600  metroNIDAZOLE (FLAGYL) tablet 500 mg  Status:  Discontinued     500 mg Oral Every 8 hours 04/05/16 1554 04/06/16 1552   04/05/16 1030  meropenem (MERREM) 500 mg in sodium chloride 0.9 % 50 mL IVPB     500 mg 100 mL/hr over 30 Minutes Intravenous Daily 04/05/16 1015 04/10/16 0959   04/05/16 0500  piperacillin-tazobactam (ZOSYN) IVPB 3.375  g  Status:  Discontinued     3.375 g 12.5 mL/hr over 240 Minutes Intravenous Every 12 hours 04/04/16 2049 04/05/16 0842   04/04/16 2100  vancomycin (VANCOCIN) IVPB 750 mg/150 ml premix     750 mg 150 mL/hr over 60 Minutes Intravenous  Once 04/04/16 2047 04/05/16 0026   04/04/16 1730  piperacillin-tazobactam (ZOSYN) IVPB 3.375 g     3.375 g 100 mL/hr over 30 Minutes Intravenous  Once 04/04/16 1716 04/04/16 1847   04/04/16 1730  vancomycin (VANCOCIN) IVPB 1000 mg/200 mL premix     1,000 mg 200 mL/hr over 60 Minutes Intravenous  Once 04/04/16 1716 04/04/16 1847       Assessment/Plan:  History reviewed and as patient improves on antibiotics he may require debridement of this decubitus as it is believed that urosepsis because of his underlying condition requiring admission at this point. We'll continue to follow.  Lattie Haw, MD, FACS  04/07/2016

## 2016-04-07 NOTE — Progress Notes (Signed)
PRE HD Assessment 

## 2016-04-07 NOTE — Progress Notes (Signed)
PRE HD   

## 2016-04-07 NOTE — Progress Notes (Signed)
Blood transfusion (RBCs) delivered to dialysis RN for transfusion during dialysis

## 2016-04-07 NOTE — Progress Notes (Signed)
Subjective: Admitted with sepsis. Getting dialysis. Awake and alert. Complains of some pain in his legs which seems to be chronic. Afebrile. No shortness of breath or chest pain. No abdominal pain or vomiting.  Objective: Vital signs in last 24 hours: Temp:  [97.6 F (36.4 C)-98.1 F (36.7 C)] 98.1 F (36.7 C) (05/30 1330) Pulse Rate:  [101-107] 104 (05/30 1400) Resp:  [16-18] 18 (05/30 1400) BP: (106-175)/(54-83) 125/72 mmHg (05/30 1400) SpO2:  [100 %] 100 % (05/30 1330) Weight:  [83.1 kg (183 lb 3.2 oz)] 83.1 kg (183 lb 3.2 oz) (05/30 1325) Weight change:  Last BM Date: 04/07/16  Intake/Output from previous day: 05/29 0701 - 05/30 0700 In: 100 [IV Piggyback:100] Out: 0  Intake/Output this shift:    General appearance: cooperative, no distress and . Head: Normocephalic, without obvious abnormality, atraumatic Resp: clear to auscultation bilaterally and normal percussion bilaterally Cardio: regular rate and rhythm and no rub GI: soft, non-tender; bowel sounds normal; no masses,  no organomegaly Pelvic: Suprapubic cath in place.  Extremities: edema lower ext Skin: large stage 4 sacral decubitis ulcer Neurologic: Grossly normal  Lab Results:  Recent Labs  04/05/16 0712 04/07/16 0447  WBC 22.9* 20.5*  HGB 7.0* 6.9*  HCT 23.2* 22.4*  PLT 127* 107*   BMET  Recent Labs  04/04/16 1719 04/05/16 0712  NA 134* 130*  K 3.2* 3.4*  CL 93* 93*  CO2 30 25  GLUCOSE 195* 268*  BUN 15 18  CREATININE 1.93* 2.18*  CALCIUM 7.8* 7.4*    Studies/Results: US Renal  04/06/2016  CLINICAL DATA:  History of urinary retention. EXAM: RENAL / URINARY TRACT ULTRASOUND COMPLETE COMPARISON:  CT of 02/20/2015 FINDINGS: Right Kidney: Length: 9.6 cm. No hydronephrosis. Renal cortical thinning and increased echogenicity. Left Kidney: Length: 9.0 cm. No hydronephrosis. Renal cortical thinning and increased echogenicity. Bladder: Collapsed around a Foley catheter. Incidental note is made  of large amount of ascites. Bilateral pleural effusions. Splenomegaly, with splenic volume of 680 cc. IMPRESSION: 1. Bladder collapsed around a Foley catheter. 2. Medical renal disease, without hydronephrosis. 3. Ascites and bilateral pleural effusions, suggesting fluid overload. 4. Splenomegaly. Electronically Signed   By: Jeronimo Greaves M.D.   On: 04/06/2016 10:42    Medications:  Scheduled: . sodium chloride   Intravenous Once  . ampicillin (OMNIPEN) IV  2 g Intravenous Q12H  . calcium-vitamin D  1 tablet Oral Q breakfast  . epoetin (EPOGEN/PROCRIT) injection  10,000 Units Intravenous Q T,Th,Sa-HD  . gentamicin irrigation   Irrigation BID  . heparin  5,000 Units Subcutaneous Q8H  . insulin aspart  0-15 Units Subcutaneous TID WC  . insulin aspart  0-5 Units Subcutaneous QHS  . levothyroxine  100 mcg Oral QHS  . liothyronine  10 mcg Oral Daily  . meropenem (MERREM) IV  500 mg Intravenous Daily  . omega-3 acid ethyl esters  1 g Oral Daily  . pantoprazole  40 mg Oral BID  . pregabalin  75 mg Oral BID  . rosuvastatin  10 mg Oral QHS  . sodium hypochlorite   Irrigation BID  . vancomycin  125 mg Oral QID   Continuous:    Assessment/Plan:  * Escherichia coli and enterococcus bacteremia and UTI with sepsis Presently on meropenem and ampicillin. Seen by urology and started on gentamicin irrigation of the bladder. Infectious disease consult placed. Echocardiogram showed no endocarditis. WBC count improving.  * C. Difficile infection On oral vancomycin Diarrhea improving  * ESRD: HD per nephrology. Getting hemodialysis  today  * Anemia of chronic disease Getting 1 unit packed RBC transfusion today  * Sacral Decubitis Ulcer: Present on admission. Wound care has placed on air mattres. Likely needs debridement. This is being considered for later this week by surgery.  * Severe Spinal Stenosis: Appears comfortable.  * Suprapubic cath Cath changed by urology. Now getting gentamicin  flushes.  Time spent 30 min   LOS: 3 days   Havah Ammon R 04/07/2016, 2:11 PM

## 2016-04-07 NOTE — Progress Notes (Signed)
Dialysis complete.  Tolerated well.  VS stable.  2,000 ml removed

## 2016-04-07 NOTE — Progress Notes (Signed)
Blood transfusion started.. Given in HD

## 2016-04-07 NOTE — Progress Notes (Signed)
Inpatient Diabetes Program Recommendations  AACE/ADA: New Consensus Statement on Inpatient Glycemic Control (2015)  Target Ranges:  Prepandial:   less than 140 mg/dL      Peak postprandial:   less than 180 mg/dL (1-2 hours)      Critically ill patients:  140 - 180 mg/dL   Review of Glycemic Control  Results for GENERAL, WEARING (MRN [id number]) as of 04/07/2016 08:00  Ref. Range 04/06/2016 07:30 04/06/2016 12:09 04/06/2016 17:07 04/06/2016 23:01 04/07/2016 07:28  Glucose-Capillary Latest Ref Range: 65-99 mg/dL 536 (H) 144 (H) 315 (H) 152 (H) 187 (H)    Diabetes history: Notes indicate he has Type 1 diabetes Outpatient Diabetes medications: Levemir 20 units qhs, Novolog 0-6 units tid with meals and hs (based on a sliding scale) Current orders for Inpatient glycemic control: Novolog 0-5 units qhs, Novolog 0-15 units tid   Inpatient Diabetes Program Recommendations:    Patient has Type 1 diabetes and therefore requires basal insulin.   Consider starting Levemir insulin 6 units qhs  Please consider changing Novolog to a custom correction scale (as used at home).   Blood glucose 201-250mg /dl= 2 units   251-300mg /dl= 3 units   301-350mg /dl= 4 units   351-400mg /dl= 5 units   401-450mg /dl= 6 units   400-$QQPYPPJKDTOIZTIW_PYKDXIPJASNKNLZJQBHALPFXTKWIOXBD$$ZHGDJMEQASTMHDQQ_IWLNLGXQJJHERDEYCXKGYJEHUDJSHFWY$, RN, 637-$CHYIFOYDXAJOINOM_VEHMCNOBSJGGEZMOQHUTMLYYTKPTWSFK$$CLEXNTZGYFVCBSWH_QPRFFMBWGYKZLDJTTSVXBLTJQZESPQZR$, 007-$MAUQJFHLKTGYBWLS_LHTDSKAJGOTLXBWIOMBTDHRCBULAGTXM$$IWOEHOZYYQMGNOIB_BCWUGQBVQXIHWTUUEKCMKLKJZPHXTAVW$, CDE Diabetes Coordinator Inpatient Diabetes Program  [phone] (Team Pager) [phone] [facility] Office) 04/07/2016 8:05 AM

## 2016-04-07 NOTE — Consult Note (Signed)
[facility] Infectious Disease     Reason for Consult Ecoli and enterococcal bacteremia   Referring Physician: Boykin Reaper Date of Admission:  04/04/2016   Active Problems:   Sepsis ([place])   HPI: Taylor [name] is a 45 y.o. male admitted from [facility] 5/27 with AMS, sepsis , wbc 28. [name] He has hx of ESRD on HD, DM, CAD s/p CABG and stents, suprapubic catheter, as well as bedbound status from spinal stenosis. He was found to have a large sacral decubitus ulcer, E coli and enterococcal bacteremia, UA with TNTC wbc (UCX mixed) as well as C diff.  He has been seen by surgery who rec debridement and wound vac once off antiplatlet agents and hgb improving. Has been on ampicillin and gent and meroepenem.    Of note second bcx seems to be mixed as well with GPC, GPR and anaerobes.   Past Medical History  Diagnosis Date  . End stage renal disease on dialysis ([place])     LUE fistula  . Type I diabetes mellitus ([place])     a. 03/2014 admitted with HNK to [facility]. b. TTS  . Diabetic neuropathy (HCC)     severe, s/p multiple toe amputation  . Hypothyroidism   . Hypertension   . Hyperlipidemia   . H/O hiatal hernia   . GERD (gastroesophageal reflux disease)   . Anxiety   . Sebaceous cyst     side of neck  . Pneumonia     2010  . Anemia     a. req PRBC's 2011.  [name] PAD (peripheral artery disease) ([place])     a. s/p amputation of toes on the right;  b. left LE claudication.  . Coronary artery disease     a. s/p MI;  b. 10/2009 CABG x 3 @ [facility]: LIMA->LAD, VG->OM3, VG->RPDA; c. 11/2010 Cath 3/3 patent grafts;  d 12/2012 Cath: LM 30d, LAD 85p, D1 70, D2 90, LCX 40ost, OM2 100, RCA 90p, 119m L->LAD ok, VG->OM3 ok, VG->RPDA 30, EF 50%->Med Rx.  . Cataract     right  . Valvular disease     a. 11/2012 Echo: EF 55-60%, mild LVH, mild MR, mild bi-atrial enlargement, mild-mod TR, PASP 493mg; b. echo 03/2014: EF 50-55%, nl WM, select images concerning for bicuspid aortic valve w/ nl thicknes of leaflets, mild MR,  mild bi-atrial dilatation, RV mild dilatation - wall thickness nl, mod TR - select images appears to be mod to sev, PASP at least mod elevated.  . Diastolic CHF ([place]    see echo above  . Depression   . Arthritis     rheumatoid arthritis   . [place] infarction ([place]  . Pulmonary HTN ([place]    a. continuation of 03/2014 echo PASP @ least mod elevated. RVSP 52 mm Hg. Parasternal long axis estimated @ 85 mm Hg  . Scrotal abscess     a. s/p multiple I&D  . Penile abscess     a. s/p multiple I&D  . Hematemesis     a. EGD 2016 with LA Grade C esophagitis, continued on Protonix   . Chronic respiratory failure (HCC)     a. on 3L oxygen via nasal cannula  . Anxiety   . Acute delirium   . Sepsis ([place]CO[place]  . 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 MYOCARDIAL INFARCTION, HX OF 01/26/2011    Qualifier: Diagnosis of  By: ArDeborra MedinaD, TaTanja Port  . HTN (hypertension) 02/11/2011  . Pulmonary  hypertension associated with end stage renal disease on dialysis ([place]) 04/16/2014  . Hyperkalemia   . Hypothyroidism   . Osteomyelitis [facility])    Past Surgical History  Procedure Laterality Date  . Dialysis fistula creation      left upper arm fistula  . Amputation      TOES ON BOTH FEET  . Abscess drainage  Behind right ear/occipital scalp  . Skin graft    . Eye surgery  1999  . Coronary artery bypass graft  10/2009    [facility] (Dr. Tamala Julian)  . Foot amputation    . Cardiac catheterization    . Cardiac catheterization  2/14    [facility]: severe 3 vessel CAD with patent grafts, RHC: moderately elevated PCW and pulmonary hypertension  . Penile prosthesis implant N/A 07/24/2014    Procedure: SALINE PENILE INJECTION WITH DISSECTION OF CORPORA ,  IMPLANTATION OF COLOPLAST PENILE PROTHESIS INFLATABLE;  Surgeon: Ailene Rud, MD;  Location: [facility];  Service: Urology;  Laterality: N/A;  . Removal of penile prosthesis N/A 08/22/2014    Procedure: EXPLANT OF INFECTED  PENILE PROSTHESIS;  Surgeon: Jorja Loa, MD;   Location: [facility];  Service: Urology;  Laterality: N/A;  . Irrigation and debridement abscess Bilateral 12/14/2014    Procedure: Bilateral Corporal Irrigation with Cultures and Drainage, Penrose Drain Insertion;  Surgeon: Ailene Rud, MD;  Location: [place];  Service: Urology;  Laterality: Bilateral;  . Scrotal exploration N/A 02/03/2015    Procedure: IRRIGATION AND DEBRIDEMENT SCROTAL/PENILE ABSCESS;  Surgeon: Alexis Frock, MD;  Location: [place];  Service: Urology;  Laterality: N/A;  . Cystoscopy N/A 02/03/2015    Procedure: CYSTOSCOPY;  Surgeon: Alexis Frock, MD;  Location: [place];  Service: Urology;  Laterality: N/A;  . Esophagogastroduodenoscopy N/A 03/18/2015    Procedure: ESOPHAGOGASTRODUODENOSCOPY (EGD);  Surgeon: Manya Silvas, MD;  Location: [facility] ENDOSCOPY;  Service: Endoscopy;  Laterality: N/A;  . Cardiac catheterization N/A 05/27/2015    Procedure: Right Heart Cath;  Surgeon: Wellington Hampshire, MD;  Location: [place] CV LAB;  Service: Cardiovascular;  Laterality: N/A;   Social History  Substance Use Topics  . Smoking status: Never Smoker   . Smokeless tobacco: Former Systems developer    Types: Chew    Quit date: 08/23/1995  . Alcohol Use: No     Comment: occasional beer   Family History  Problem Relation Age of Onset  . Heart disease Other   . Hypertension Other   . Hypertension Mother   . Heart disease Mother   . Diabetes Mother   . Birth defects Paternal Uncle     unaware  . Birth defects Paternal Grandmother     breast  . Kidney disease Neg Hx   . Prostate cancer Neg Hx     Allergies:  Allergies  Allergen Reactions  . Amoxicillin-Pot Clavulanate Nausea And Vomiting and Itching  . Contrast Media [Iodinated Diagnostic Agents] Other (See Comments)    Unknown reaction  . 2nd Skin Quick Heal Other (See Comments)  . Rifampin Nausea And Vomiting  . Tape Other (See Comments)    Current antibiotics: Antibiotics Given (last 72 hours)    Date/Time Action Medication  Dose Rate   04/04/16 2326 Given   vancomycin (VANCOCIN) IVPB 750 mg/150 ml premix 750 mg 150 mL/hr   04/05/16 0559 Given   piperacillin-tazobactam (ZOSYN) IVPB 3.375 g 3.375 g 12.5 mL/hr   04/05/16 1129 Given   meropenem (MERREM) 500 mg in sodium chloride 0.9 % 50 mL  IVPB 500 mg 100 mL/hr   04/05/16 1610 Given   metroNIDAZOLE (FLAGYL) tablet 500 mg 500 mg    04/05/16 1706 Given   ampicillin (OMNIPEN) 2 g in sodium chloride 0.9 % 50 mL IVPB 2 g 150 mL/hr   04/05/16 2219 Given   metroNIDAZOLE (FLAGYL) tablet 500 mg 500 mg    04/05/16 2220 Given   gentamicin (GARAMYCIN) 80 mg in sodium chloride irrigation 0.9 % 500 mL irrigation     04/06/16 0410 Given  [pt preference care clustered]   metroNIDAZOLE (FLAGYL) tablet 500 mg 500 mg    04/06/16 1043 Given   ampicillin (OMNIPEN) 2 g in sodium chloride 0.9 % 50 mL IVPB 2 g 150 mL/hr   04/06/16 1045 Given   gentamicin (GARAMYCIN) 80 mg in sodium chloride irrigation 0.9 % 500 mL irrigation     04/06/16 1102 Given  [patient off floor to ultrasound]   meropenem (MERREM) 500 mg in sodium chloride 0.9 % 50 mL IVPB 500 mg 100 mL/hr   04/06/16 1434 Given   metroNIDAZOLE (FLAGYL) tablet 500 mg 500 mg    04/06/16 1732 Given   vancomycin (VANCOCIN) 50 mg/mL oral solution 125 mg 125 mg    04/06/16 2237 Given   ampicillin (OMNIPEN) 2 g in sodium chloride 0.9 % 50 mL IVPB 2 g 150 mL/hr   04/06/16 2237 Given   gentamicin (GARAMYCIN) 80 mg in sodium chloride irrigation 0.9 % 500 mL irrigation     04/06/16 2335 Given   vancomycin (VANCOCIN) 50 mg/mL oral solution 125 mg 125 mg    04/07/16 0936 Given   vancomycin (VANCOCIN) 50 mg/mL oral solution 125 mg 125 mg    04/07/16 0937 Given   gentamicin (GARAMYCIN) 80 mg in sodium chloride irrigation 0.9 % 500 mL irrigation     04/07/16 1132 Given  [Dialysis was anticipated for 10:30 or 11:00.  Rescheduled to late afternoon]   ampicillin (OMNIPEN) 2 g in sodium chloride 0.9 % 50 mL IVPB 2 g 150 mL/hr    04/07/16 1157 Given  [anticipated dialysis at 10:30 to 11:00. now rescheduled for late afternoon]   meropenem (MERREM) 500 mg in sodium chloride 0.9 % 50 mL IVPB 500 mg 100 mL/hr   04/07/16 1527 Given   vancomycin (VANCOCIN) 50 mg/mL oral solution 125 mg 125 mg       MEDICATIONS: . sodium chloride   Intravenous Once  . ampicillin (OMNIPEN) IV  2 g Intravenous Q12H  . calcium-vitamin D  1 tablet Oral Q breakfast  . epoetin (EPOGEN/PROCRIT) injection  10,000 Units Intravenous Q T,Th,Sa-HD  . gentamicin irrigation   Irrigation BID  . heparin  5,000 Units Subcutaneous Q8H  . insulin aspart  0-15 Units Subcutaneous TID WC  . insulin aspart  0-5 Units Subcutaneous QHS  . levothyroxine  100 mcg Oral QHS  . liothyronine  10 mcg Oral Daily  . meropenem (MERREM) IV  500 mg Intravenous Daily  . omega-3 acid ethyl esters  1 g Oral Daily  . pantoprazole  40 mg Oral BID  . pregabalin  75 mg Oral BID  . rosuvastatin  10 mg Oral QHS  . sodium hypochlorite   Irrigation BID  . vancomycin  125 mg Oral QID    Review of Systems - 11 systems reviewed and negative per HPI   OBJECTIVE: Temp:  [97.6 F (36.4 C)-98.3 F (36.8 C)] 98.1 F (36.7 C) (05/30 1545) Pulse Rate:  [101-108] 108 (05/30 1600) Resp:  [16-18] 18 (05/30  1600) BP: (106-175)/(54-83) 139/72 mmHg (05/30 1600) SpO2:  [100 %] 100 % (05/30 1500) Weight:  [83.1 kg (183 lb 3.2 oz)] 83.1 kg (183 lb 3.2 oz) (05/30 1325) Physical Exam  Constitutional: He is oriented to person, place, and time. But slowed mentation. Chronically ill appearing. HENT: anicteric Mouth/Throat: Oropharynx is clear and dry . No oropharyngeal exudate.  Cardiovascular: Normal rate, regular rhythm and normal heart sounds. E Pulmonary/Chest: Effort normal and breath sounds normal. No respiratory distress. He has no wheezes.  Abdominal: Soft. Bowel sounds are normal. Moderate distention SP cath in place with no drainage, clear urine Lymphadenopathy:  He has no  cervical adenopathy.  Neurological: He is alert and oriented to person, place, and time. 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 His behavior is normal.    LABS: Results for orders placed or performed during the hospital encounter of 04/04/16 (from the past 48 hour(s))  Glucose, capillary     Status: Abnormal   Collection Time: 04/05/16  4:28 PM  Result Value Ref Range   Glucose-Capillary 281 (H) 65 - 99 mg/dL   Comment 1 Notify RN   Glucose, capillary     Status: Abnormal   Collection Time: 04/05/16 10:05 PM  Result Value Ref Range   Glucose-Capillary 214 (H) 65 - 99 mg/dL  MRSA PCR Screening     Status: None   Collection Time: 04/05/16 10:20 PM  Result Value Ref Range   MRSA by PCR NEGATIVE NEGATIVE    Comment:        The GeneXpert MRSA Assay (FDA approved for NASAL specimens only), is one component of a comprehensive MRSA colonization surveillance program. It is not intended to diagnose MRSA infection nor to guide or monitor treatment for MRSA infections.   Glucose, capillary     Status: Abnormal   Collection Time: 04/06/16  7:30 AM  Result Value Ref Range   Glucose-Capillary 198 (H) 65 - 99 mg/dL   Comment 1 Notify RN   Glucose, capillary     Status: Abnormal   Collection Time: 04/06/16 12:09 PM  Result Value Ref Range   Glucose-Capillary 181 (H) 65 - 99 mg/dL   Comment 1 Notify RN   Glucose, capillary     Status: Abnormal   Collection Time: 04/06/16  5:07 PM  Result Value Ref Range   Glucose-Capillary 152 (H) 65 - 99 mg/dL  Type and screen [facility]     Status: None (Preliminary result)   Collection Time: 04/06/16  5:30 PM  Result Value Ref Range   ABO/RH(D) O POS    Antibody Screen NEG    Sample Expiration 04/09/2016    Unit Number [id number]    Blood Component Type RBC LR PHER1     Unit division 00    Status of Unit ISSUED    Transfusion Status OK TO TRANSFUSE    Crossmatch Result Compatible   Prepare RBC     Status: None   Collection Time: 04/06/16  5:30 PM  Result Value Ref Range   Order Confirmation ORDER PROCESSED BY BLOOD BANK   Glucose, capillary     Status: Abnormal   Collection Time: 04/06/16 11:01 PM  Result Value Ref Range   Glucose-Capillary 152 (H) 65 - 99 mg/dL  C-reactive protein     Status: Abnormal   Collection Time: 04/07/16  4:46 AM  Result Value Ref Range   CRP 20.5 (H) <1.0 mg/dL    Comment: Performed at [facility]  Sedimentation rate     Status: Abnormal   Collection Time: 04/07/16  4:47 AM  Result Value Ref Range   Sed Rate 93 (H) 0 - 15 mm/hr  CBC     Status: Abnormal   Collection Time: 04/07/16  4:47 AM  Result Value Ref Range   WBC 20.5 (H) 3.8 - 10.6 K/uL   RBC 2.52 (L) 4.40 - 5.90 MIL/uL   Hemoglobin 6.9 (L) 13.0 - 18.0 g/dL   HCT 22.4 (L) 40.0 - 52.0 %   MCV 88.7 80.0 - 100.0 fL   MCH 27.2 26.0 - 34.0 pg   MCHC 30.7 (L) 32.0 - 36.0 g/dL   RDW 18.2 (H) 11.5 - 14.5 %   Platelets 107 (L) 150 - 440 K/uL  Glucose, capillary     Status: Abnormal   Collection Time: 04/07/16  7:28 AM  Result Value Ref Range   Glucose-Capillary 187 (H) 65 - 99 mg/dL   Comment 1 Notify RN    Comment 2 Document in Chart   Glucose, capillary     Status: Abnormal   Collection Time: 04/07/16 11:23 AM  Result Value Ref Range   Glucose-Capillary 216 (H) 65 - 99 mg/dL   Comment 1 Notify RN    Comment 2 Document in Chart    No components found for: ESR, C REACTIVE PROTEIN MICRO: Recent Results (from the past 720 hour(s))  Urine culture     Status: Abnormal   Collection Time: 04/04/16  5:16 PM  Result Value Ref Range Status   Specimen Description URINE, RANDOM  Final   Special Requests NONE  Final   Culture MULTIPLE SPECIES PRESENT, SUGGEST RECOLLECTION (A)  Final   Report Status 04/06/2016 FINAL  Final  Blood Culture (routine x 2)      Status: Abnormal (Preliminary result)   Collection Time: 04/04/16  5:19 PM  Result Value Ref Range Status   Specimen Description BLOOD RIGHT HAND  Final   Special Requests BOTTLES DRAWN AEROBIC AND ANAEROBIC  6CC  Final   Culture  Setup Time   Final    GRAM NEGATIVE RODS GRAM POSITIVE COCCI IN PAIRS IN CHAINS IN BOTH AEROBIC AND ANAEROBIC BOTTLES CRITICAL RESULT CALLED TO, READ BACK BY AND VERIFIED WITH: ALLISON THARAKAN AT [place] ON 04/05/16. CTJ    Culture (A)  Final    ESCHERICHIA COLI ENTEROCOCCUS SPECIES IN BOTH AEROBIC AND ANAEROBIC BOTTLES SUSCEPTIBILITIES TO FOLLOW FOR ORGANISM 2 ONCE ISOLATED    Report Status PENDING  Incomplete   Organism ID, Bacteria ESCHERICHIA COLI  Final      Susceptibility   Escherichia coli - MIC*    AMPICILLIN >=32 RESISTANT Resistant     CEFAZOLIN <=4 SENSITIVE Sensitive     CEFEPIME <=1 SENSITIVE Sensitive     CEFTAZIDIME <=1 SENSITIVE Sensitive     CEFTRIAXONE <=1 SENSITIVE Sensitive     CIPROFLOXACIN >=4 RESISTANT Resistant     GENTAMICIN <=1 SENSITIVE Sensitive     IMIPENEM <=0.25 SENSITIVE Sensitive     TRIMETH/SULFA <=20 SENSITIVE Sensitive     AMPICILLIN/SULBACTAM 4 SENSITIVE Sensitive     PIP/TAZO <=4 SENSITIVE Sensitive     Extended ESBL NEGATIVE Sensitive     * ESCHERICHIA COLI  Blood Culture (routine x 2)     Status: Abnormal (Preliminary result)   Collection Time: 04/04/16  5:19 PM  Result  Value Ref Range Status   Specimen Description BLOOD RIGHT FOREARM  Final   Special Requests BOTTLES DRAWN AEROBIC AND ANAEROBIC  5CC  Final   Culture  Setup Time   Final    GRAM NEGATIVE RODS AEROBIC BOTTLE ONLY CRITICAL RESULT CALLED TO, READ BACK BY AND VERIFIED WITH: ALLISON THARAKAN AT [place] ON 04/05/16. CTJ GRAM POSITIVE COCCI GRAM POSITIVE RODS ANAEROBIC BOTTLE ONLY CRITICAL RESULT CALLED TO, READ BACK BY AND VERIFIED WITH: ALLISON THARAKAN ON 04/05/16 AT 4 BY QSD    Culture (A)  Final    ESCHERICHIA COLI GRAM POSITIVE COCCI GRAM  POSITIVE RODS ANAEROBIC BOTTLE ONLY IDENTIFICATION AND SUSCEPTIBILITIES TO FOLLOW    Report Status PENDING  Incomplete  Blood Culture ID Panel (Reflexed)     Status: Abnormal   Collection Time: 04/04/16  5:19 PM  Result Value Ref Range Status   Enterococcus species DETECTED (A) NOT DETECTED Final    Comment: CRITICAL RESULT CALLED TO, READ BACK BY AND VERIFIED WITH: ALLISON THARAKAN FOR ENTEROCOCCUS, ENTEROBACTERIACEAE AND E. COLI AT 0825 ON 04/05/16. CTJ    Vancomycin resistance NOT DETECTED NOT DETECTED Final   Listeria monocytogenes NOT DETECTED NOT DETECTED Final   Staphylococcus species NOT DETECTED NOT DETECTED Final   Staphylococcus aureus NOT DETECTED NOT DETECTED Final   Methicillin resistance NOT DETECTED NOT DETECTED Final   Streptococcus species NOT DETECTED NOT DETECTED Final   Streptococcus agalactiae NOT DETECTED NOT DETECTED Final   Streptococcus pneumoniae NOT DETECTED NOT DETECTED Final   Streptococcus pyogenes NOT DETECTED NOT DETECTED Final   Acinetobacter baumannii NOT DETECTED NOT DETECTED Final   Enterobacteriaceae species DETECTED (A) NOT DETECTED Final   Enterobacter cloacae complex NOT DETECTED NOT DETECTED Final   Escherichia coli DETECTED (A) NOT DETECTED Final   Klebsiella oxytoca NOT DETECTED NOT DETECTED Final   Klebsiella pneumoniae NOT DETECTED NOT DETECTED Final   Proteus species NOT DETECTED NOT DETECTED Final   Serratia marcescens NOT DETECTED NOT DETECTED Final   Carbapenem resistance NOT DETECTED NOT DETECTED Final   Haemophilus influenzae NOT DETECTED NOT DETECTED Final   Neisseria meningitidis NOT DETECTED NOT DETECTED Final   Pseudomonas aeruginosa NOT DETECTED NOT DETECTED Final   Candida albicans NOT DETECTED NOT DETECTED Final   Candida glabrata NOT DETECTED NOT DETECTED Final   Candida krusei NOT DETECTED NOT DETECTED Final   Candida parapsilosis NOT DETECTED NOT DETECTED Final   Candida tropicalis NOT DETECTED NOT DETECTED Final  C  difficile quick scan w PCR reflex     Status: Abnormal   Collection Time: 04/05/16 11:57 AM  Result Value Ref Range Status   C Diff antigen POSITIVE (A) NEGATIVE Final   C Diff toxin NEGATIVE NEGATIVE Final   C Diff interpretation   Final    Positive for toxigenic C. difficile, active toxin production not detected. Patient has toxigenic C. difficile organisms present in the bowel, but toxin was not detected. The patient may be a carrier or the level of toxin in the sample was below the limit  of detection. This information should be used in conjunction with the patient's clinical history when deciding on possible therapy.     Comment: CRITICAL RESULT CALLED TO, READ BACK BY AND VERIFIED WITH: TAYLOR BECK @ [id number] 04/05/16 BY [place]   Clostridium Difficile by PCR     Status: Abnormal   Collection Time: 04/05/16 11:57 AM  Result Value Ref Range Status   Toxigenic C Difficile by pcr POSITIVE (A) NEGATIVE Final  Comment: CRITICAL RESULT CALLED TO, READ BACK BY AND VERIFIED WITH: TAYLOR BECK @ [id number] 04/05/16 BY [facility]   MRSA PCR Screening     Status: None   Collection Time: 04/05/16 10:20 PM  Result Value Ref Range Status   MRSA by PCR NEGATIVE NEGATIVE Final    Comment:        The GeneXpert MRSA Assay (FDA approved for NASAL specimens only), is one component of a comprehensive MRSA colonization surveillance program. It is not intended to diagnose MRSA infection nor to guide or monitor treatment for MRSA infections.     IMAGING: US Renal  04/06/2016  CLINICAL DATA:  History of urinary retention. EXAM: RENAL / URINARY TRACT ULTRASOUND COMPLETE COMPARISON:  CT of 02/20/2015 FINDINGS: Right Kidney: Length: 9.6 cm. No hydronephrosis. Renal cortical thinning and increased echogenicity. Left Kidney: Length: 9.0 cm. No hydronephrosis. Renal cortical thinning and increased echogenicity. Bladder: Collapsed around a Foley catheter. Incidental note is made of large amount of ascites. Bilateral pleural  effusions. Splenomegaly, with splenic volume of 680 cc. IMPRESSION: 1. Bladder collapsed around a Foley catheter. 2. Medical renal disease, without hydronephrosis. 3. Ascites and bilateral pleural effusions, suggesting fluid overload. 4. Splenomegaly. Electronically Signed   By: Abigail Miyamoto M.D.   On: 04/06/2016 10:42   Dg Chest Port 1 View  04/04/2016  CLINICAL DATA:  Altered mental status EXAM: PORTABLE CHEST 1 VIEW COMPARISON:  None. FINDINGS: Cardiac shadow is at the upper limits of normal in size. Postsurgical changes are again seen. Mild vascular congestion is noted with mild interstitial edema. No focal infiltrate or sizable effusion is noted. IMPRESSION: Mild CHF. Electronically Signed   By: Inez Catalina M.D.   On: 04/04/2016 17:44    Assessment:   Taylor [name] is a 45 y.o. male with a large sacral decubitus ulcer, E coli and enterococcal bacteremia, UA with TNTC wbc (UCX mixed) as well as C diff.  I suspect he is bacteremic from the Urine or wound (final ID of all bacteria in blood is pending) Clinically responding with no fever and wbc down to 20 from 28. Currently on amp, gent, meropenem and oral vanco  Recommendations Would await final ID and sensis of organism.  WIll need I and D of decub and wound care Would likley be able to treat for extended period with IV abx at HD - Vanco for the enterococcus if sensitive and ceftazidime for the E coli. This would avoid having to place a line in ESRD HD patient.    Thank you very much for allowing me to participate in the care of this patient. Please call with questions.   Cheral Marker. Ola Spurr, MD

## 2016-04-08 LAB — TYPE AND SCREEN
ABO/RH(D): O POS
ANTIBODY SCREEN: NEGATIVE
UNIT DIVISION: 0

## 2016-04-08 LAB — CBC WITH DIFFERENTIAL/PLATELET
BASOS ABS: 0.1 10*3/uL (ref 0–0.1)
EOS ABS: 0.1 10*3/uL (ref 0–0.7)
HCT: 26.3 % — ABNORMAL LOW (ref 40.0–52.0)
Hemoglobin: 8.1 g/dL — ABNORMAL LOW (ref 13.0–18.0)
Lymphs Abs: 0.6 10*3/uL — ABNORMAL LOW (ref 1.0–3.6)
MCH: 27.2 pg (ref 26.0–34.0)
MCHC: 31 g/dL — ABNORMAL LOW (ref 32.0–36.0)
MCV: 87.8 fL (ref 80.0–100.0)
MONO ABS: 0.5 10*3/uL (ref 0.2–1.0)
Monocytes Relative: 3 %
Neutro Abs: 18.3 10*3/uL — ABNORMAL HIGH (ref 1.4–6.5)
Neutrophils Relative %: 93 %
PLATELETS: 98 10*3/uL — AB (ref 150–440)
RBC: 3 MIL/uL — ABNORMAL LOW (ref 4.40–5.90)
RDW: 18 % — AB (ref 11.5–14.5)
WBC: 19.6 10*3/uL — ABNORMAL HIGH (ref 3.8–10.6)

## 2016-04-08 LAB — GLUCOSE, CAPILLARY
GLUCOSE-CAPILLARY: 188 mg/dL — AB (ref 65–99)
Glucose-Capillary: 156 mg/dL — ABNORMAL HIGH (ref 65–99)
Glucose-Capillary: 224 mg/dL — ABNORMAL HIGH (ref 65–99)
Glucose-Capillary: 247 mg/dL — ABNORMAL HIGH (ref 65–99)
Glucose-Capillary: 204 mg/dL — ABNORMAL HIGH (ref 65–99)

## 2016-04-08 LAB — HIV ANTIBODY (ROUTINE TESTING W REFLEX): HIV SCREEN 4TH GENERATION: NONREACTIVE

## 2016-04-08 LAB — HEPATITIS C ANTIBODY (REFLEX)

## 2016-04-08 LAB — HCV COMMENT:

## 2016-04-08 MED ORDER — ENSURE ENLIVE PO LIQD
237.0000 mL | Freq: Three times a day (TID) | ORAL | Status: DC
  Administered 2016-04-08 – 2016-04-11 (×8): 237 mL via ORAL

## 2016-04-08 MED ORDER — DEXTROSE 5 % IV SOLN
2.0000 g | Freq: Once | INTRAVENOUS | Status: AC
  Administered 2016-04-08: 2 g via INTRAVENOUS
  Filled 2016-04-08: qty 2

## 2016-04-08 MED ORDER — VANCOMYCIN HCL IN DEXTROSE 1-5 GM/200ML-% IV SOLN
1000.0000 mg | INTRAVENOUS | Status: DC
  Administered 2016-04-09 – 2016-04-11 (×2): 1000 mg via INTRAVENOUS
  Filled 2016-04-08 (×2): qty 200

## 2016-04-08 MED ORDER — PRO-STAT SUGAR FREE PO LIQD
30.0000 mL | Freq: Two times a day (BID) | ORAL | Status: DC
  Administered 2016-04-08 – 2016-04-11 (×6): 30 mL via ORAL

## 2016-04-08 MED ORDER — DEXTROSE 5 % IV SOLN
1.0000 g | INTRAVENOUS | Status: DC
  Filled 2016-04-08: qty 1

## 2016-04-08 MED ORDER — DEXTROSE 5 % IV SOLN
2.0000 g | INTRAVENOUS | Status: DC
  Filled 2016-04-08 (×2): qty 2

## 2016-04-08 MED ORDER — SODIUM CHLORIDE 0.9 % IV SOLN
2000.0000 mg | Freq: Once | INTRAVENOUS | Status: AC
  Administered 2016-04-08: 2000 mg via INTRAVENOUS
  Filled 2016-04-08: qty 2000

## 2016-04-08 MED ORDER — METRONIDAZOLE 500 MG PO TABS
500.0000 mg | ORAL_TABLET | Freq: Three times a day (TID) | ORAL | Status: DC
  Administered 2016-04-08 – 2016-04-11 (×10): 500 mg via ORAL
  Filled 2016-04-08 (×10): qty 1

## 2016-04-08 NOTE — Progress Notes (Signed)
Changed dressings on sacrum/buttocks.  Scant drainage but soiled d/t BM.

## 2016-04-08 NOTE — Progress Notes (Signed)
Subjective: Admitted with sepsis and has bacteremia. HD yesterday. Awake and alert. Confused at times Afebrile. No shortness of breath or chest pain. No abdominal pain or vomiting.  Objective: Vital signs in last 24 hours: Temp:  [98 F (36.7 C)-98.3 F (36.8 C)] 98 F (36.7 C) (05/31 1144) Pulse Rate:  [102-111] 102 (05/31 1144) Resp:  [16-20] 16 (05/31 1144) BP: (107-142)/(49-72) 130/67 mmHg (05/31 1144) SpO2:  [100 %] 100 % (05/31 1144) Weight change:  Last BM Date: 04/07/16  Intake/Output from previous day: 05/30 0701 - 05/31 0700 In: 550 [Blood:500; IV Piggyback:50] Out: 2055 [Urine:55] Intake/Output this shift: Total I/O In: 360 [P.O.:360] Out: -   General appearance: cooperative, no distress and . Head: Normocephalic, without obvious abnormality, atraumatic Resp: clear to auscultation bilaterally and normal percussion bilaterally Cardio: regular rate and rhythm and no rub GI: soft, non-tender; bowel sounds normal; no masses,  no organomegaly Pelvic: Suprapubic cath in place.  Extremities: edema lower ext Skin: large stage 4 sacral decubitis ulcer Neurologic: Grossly normal  Lab Results:  Recent Labs  04/07/16 0447 04/08/16 0436  WBC 20.5* 19.6*  HGB 6.9* 8.1*  HCT 22.4* 26.3*  PLT 107* 98*   BMET No results for input(s): NA, K, CL, CO2, GLUCOSE, BUN, CREATININE, CALCIUM in the last 72 hours.  Studies/Results: No results found.  Medications:  Scheduled: . sodium chloride   Intravenous Once  . calcium-vitamin D  1 tablet Oral Q breakfast  . [START ON 04/09/2016] cefTAZidime (FORTAZ)  IV  2 g Intravenous Q T,Th,Sa-HD  . cefTAZidime (FORTAZ)  IV  2 g Intravenous Once  . epoetin (EPOGEN/PROCRIT) injection  10,000 Units Intravenous Q T,Th,Sa-HD  . gentamicin irrigation   Irrigation BID  . heparin  5,000 Units Subcutaneous Q8H  . insulin aspart  0-15 Units Subcutaneous TID WC  . insulin aspart  0-5 Units Subcutaneous QHS  . levothyroxine  100 mcg Oral  QHS  . liothyronine  10 mcg Oral Daily  . metroNIDAZOLE  500 mg Oral Q8H  . omega-3 acid ethyl esters  1 g Oral Daily  . pantoprazole  40 mg Oral BID  . pregabalin  75 mg Oral BID  . rosuvastatin  10 mg Oral QHS  . sodium hypochlorite   Irrigation BID  . vancomycin  2,000 mg Intravenous Once  . [START ON 04/09/2016] vancomycin  1,000 mg Intravenous Q T,Th,Sa-HD   Continuous:    Assessment/Plan:  * Escherichia coli and enterococcus bacteremia and UTI with sepsis Presently on meropenem and ampicillin. Change to vancomycin and Ceftzidime. Discussed with Dr. Sampson Goon. Seen by urology and started on gentamicin irrigation of the bladder. Echocardiogram showed no endocarditis. WBC count improving.  * C. Difficile infection On oral vancomycin and changed to PO Flagyl.  * ESRD: HD per nephrology. Getting hemodialysis per nephrology.  * Anemia of chronic disease S/p PRBC 04/07/2016.  * Sacral Decubitis Ulcer: Present on admission. Wound care has placed on air mattres. Needs debridement. This is being considered for later this week by surgery.  * Severe Spinal Stenosis: Appears comfortable.  * Suprapubic cath Cath changed by urology. Now getting gentamicin flushes.  Time spent 30 min   LOS: 4 days   Orie Fisherman 04/08/2016, 3:47 PM

## 2016-04-08 NOTE — Consult Note (Signed)
Pharmacy Antibiotic Note  Taylor [name] is a 45 y.o. male admitted on 04/04/2016 with bacteremia.  Pharmacy has been consulted for vancomyicn and ceftazidime dosing. Pt is an ESRD pt on HD Tues, Thus, Sat  Plan: vancomycin 2g once for load. then 1g AFTER HD on Tue, Thurs, Sat.  Ceftazidime 2g today then 2g Tues, Thus, Sat with after HD Will check trough prior to to third HD session. 6/6  Height: 5\' 10"  (177.8 cm) Weight: 183 lb 3.2 oz (83.1 kg) IBW/kg (Calculated) : 73  Temp (24hrs), Avg:98.2 F (36.8 C), Min:98 F (36.7 C), Max:98.3 F (36.8 C)   Recent Labs Lab 04/04/16 1716 04/04/16 1719 04/05/16 0712 04/07/16 0447 04/08/16 0436  WBC  --  27.8* 22.9* 20.5* 19.6*  CREATININE  --  1.93* 2.18*  --   --   LATICACIDVEN 1.5  --   --   --   --     Estimated Creatinine Clearance: 43.3 mL/min (by C-G formula based on Cr of 2.18).    Allergies  Allergen Reactions  . Amoxicillin-Pot Clavulanate Nausea And Vomiting and Itching  . Contrast Media [Iodinated Diagnostic Agents] Other (See Comments)    Unknown reaction  . 2nd Skin Quick Heal Other (See Comments)  . Rifampin Nausea And Vomiting  . Tape Other (See Comments)    Antimicrobials this admission: Antibiotics Given (last 72 hours)    Date/Time Action Medication Dose Rate   04/05/16 1610 Given   metroNIDAZOLE (FLAGYL) tablet 500 mg 500 mg    04/05/16 1706 Given   ampicillin (OMNIPEN) 2 g in sodium chloride 0.9 % 50 mL IVPB 2 g 150 mL/hr   04/05/16 2219 Given   metroNIDAZOLE (FLAGYL) tablet 500 mg 500 mg    04/05/16 2220 Given   gentamicin (GARAMYCIN) 80 mg in sodium chloride irrigation 0.9 % 500 mL irrigation     04/06/16 0410 Given  [pt preference care clustered]   metroNIDAZOLE (FLAGYL) tablet 500 mg 500 mg    04/06/16 1043 Given   ampicillin (OMNIPEN) 2 g in sodium chloride 0.9 % 50 mL IVPB 2 g 150 mL/hr   04/06/16 1045 Given   gentamicin (GARAMYCIN) 80 mg in sodium chloride irrigation 0.9 % 500 mL  irrigation     04/06/16 1102 Given  [patient off floor to ultrasound]   meropenem (MERREM) 500 mg in sodium chloride 0.9 % 50 mL IVPB 500 mg 100 mL/hr   04/06/16 1434 Given   metroNIDAZOLE (FLAGYL) tablet 500 mg 500 mg    04/06/16 1732 Given   vancomycin (VANCOCIN) 50 mg/mL oral solution 125 mg 125 mg    04/06/16 2237 Given   ampicillin (OMNIPEN) 2 g in sodium chloride 0.9 % 50 mL IVPB 2 g 150 mL/hr   04/06/16 2237 Given   gentamicin (GARAMYCIN) 80 mg in sodium chloride irrigation 0.9 % 500 mL irrigation     04/06/16 2335 Given   vancomycin (VANCOCIN) 50 mg/mL oral solution 125 mg 125 mg    04/07/16 0936 Given   vancomycin (VANCOCIN) 50 mg/mL oral solution 125 mg 125 mg    04/07/16 0937 Given   gentamicin (GARAMYCIN) 80 mg in sodium chloride irrigation 0.9 % 500 mL irrigation     04/07/16 1132 Given  [Dialysis was anticipated for 10:30 or 11:00.  Rescheduled to late afternoon]   ampicillin (OMNIPEN) 2 g in sodium chloride 0.9 % 50 mL IVPB 2 g 150 mL/hr   04/07/16 1157 Given  [anticipated dialysis at 10:30 to 11:00. now  rescheduled for late afternoon]   meropenem (MERREM) 500 mg in sodium chloride 0.9 % 50 mL IVPB 500 mg 100 mL/hr   04/07/16 1527 Given   vancomycin (VANCOCIN) 50 mg/mL oral solution 125 mg 125 mg    04/07/16 1726 Given   vancomycin (VANCOCIN) 50 mg/mL oral solution 125 mg 125 mg    04/07/16 2127 Given   ampicillin (OMNIPEN) 2 g in sodium chloride 0.9 % 50 mL IVPB 2 g 150 mL/hr   04/07/16 2127 Given   vancomycin (VANCOCIN) 50 mg/mL oral solution 125 mg 125 mg    04/07/16 2128 Given   gentamicin (GARAMYCIN) 80 mg in sodium chloride irrigation 0.9 % 500 mL irrigation     04/08/16 0855 Given   meropenem (MERREM) 500 mg in sodium chloride 0.9 % 50 mL IVPB 500 mg 100 mL/hr   04/08/16 0855 Given   ampicillin (OMNIPEN) 2 g in sodium chloride 0.9 % 50 mL IVPB 2 g 150 mL/hr   04/08/16 0855 Given   gentamicin (GARAMYCIN) 80 mg in sodium chloride irrigation 0.9 % 500 mL  irrigation     04/08/16 0855 Given   vancomycin (VANCOCIN) 50 mg/mL oral solution 125 mg 125 mg    04/08/16 1349 Given   metroNIDAZOLE (FLAGYL) tablet 500 mg 500 mg    04/08/16 1349 Given   vancomycin (VANCOCIN) 2,000 mg in sodium chloride 0.9 % 500 mL IVPB 2,000 mg 250 mL/hr       Dose adjustments this admission:   Microbiology results: 5/27 BCx: ecoli, enterococcus 5/27 UCx: mult spec   5/27 MRSA PCR: neg cdiff positive  Thank you for allowing pharmacy to be a part of this patient's care.  Olene Floss 04/08/2016 1:59 PM

## 2016-04-08 NOTE — Progress Notes (Addendum)
Initial Nutrition Assessment  DOCUMENTATION CODES:   Non-severe (moderate) malnutrition in context of chronic illness, likely progressing to severe  INTERVENTION:  -Pt reports Nepro give him diarrhea, drinks Boost/Ensure at home;  reporRecommend addition of Ensure Enlive po TID, each supplement provides 350 kcal and 20 grams of protein; add Prostat BID between meals -Will send double portion of protein (eggs at breakfast, meat at lunch/dinner) -Recommend addition of renal MVI -Feeding assistance at meal times -May benefit liberalizing diet; continue to assess   NUTRITION DIAGNOSIS:   Malnutrition related to chronic illness as evidenced by mild depletion of body fat, moderate depletion of body fat, moderate depletions of muscle mass, severe fluid accumulation.  GOAL:   Patient will meet greater than or equal to 90% of their needs  MONITOR:   PO intake, Supplement acceptance, Labs, Weight trends  REASON FOR ASSESSMENT:   Low Braden    ASSESSMENT:    45 yo male admitted with sepsis with bacteremia and UTI, C.diff infection. Pt with hx of ESRD on HD, pt with multiple pressure ulcers including stage III to sacrum which may require debridement per surgery. Hx of diabetic neuropathy with multiple toe amputaitons, severe spinal stenosis  Pt reports good appetite at home; eats 3 meals per day, meals prepared by his father. Pt reports he also drinks Boost/Ensure, but not daily. Pt reports they give him Nepro at dialysis but this gives him diarrhea.  Nutrition-Focused physical exam completed. Findings are mild/moderate fat depletion, mild/moderate muscle depletion, and severe edema.    Past Medical History  Diagnosis Date  . End stage renal disease on dialysis (HCC)     LUE fistula  . Type I diabetes mellitus (HCC)     a. 03/2014 admitted with HNK to [facility]. b. TTS  . Diabetic neuropathy (HCC)     severe, s/p multiple toe amputation  . Hypothyroidism   . Hypertension   .  Hyperlipidemia   . H/O hiatal hernia   . GERD (gastroesophageal reflux disease)   . Anxiety   . Sebaceous cyst     side of neck  . Pneumonia     2010  . Anemia     a. req PRBC's 2011.  [name] PAD (peripheral artery disease) (HCC)     a. s/p amputation of toes on the right;  b. left LE claudication.  . Coronary artery disease     a. s/p MI;  b. 10/2009 CABG x 3 @ [facility]: LIMA->LAD, VG->OM3, VG->RPDA; c. 11/2010 Cath 3/3 patent grafts;  d 12/2012 Cath: LM 30d, LAD 85p, D1 70, D2 90, LCX 40ost, OM2 100, RCA 90p, 185m, L->LAD ok, VG->OM3 ok, VG->RPDA 30, EF 50%->Med Rx.  . Cataract     right  . Valvular disease     a. 11/2012 Echo: EF 55-60%, mild LVH, mild MR, mild bi-atrial enlargement, mild-mod TR, PASP ; b. echo 03/2014: EF 50-55%, nl WM, select images concerning for bicuspid aortic valve w/ nl thicknes of leaflets, mild MR, mild bi-atrial dilatation, RV mild dilatation - wall thickness nl, mod TR - select images appears to be mod to sev, PASP at least mod elevated.  . Diastolic CHF (HCC)     see echo above  . Depression   . Arthritis     rheumatoid arthritis   . Myocardial infarction (HCC) 2010  . Pulmonary HTN (HCC)     a. continuation of 03/2014 echo PASP @ least mod elevated. RVSP 52 mm Hg. Parasternal long axis estimated @ 85 mm  Hg  . Scrotal abscess     a. s/p multiple I&D  . Penile abscess     a. s/p multiple I&D  . Hematemesis     a. EGD 2016 with LA Grade C esophagitis, continued on Protonix   . Chronic respiratory failure (HCC)     a. on 3L oxygen via nasal cannula  . Anxiety   . Acute delirium   . Sepsis (HCC)   . 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 MYOCARDIAL INFARCTION, HX OF 01/26/2011    Qualifier: Diagnosis of  By: [name] MD, [name]    . HTN (hypertension) 02/11/2011  . Pulmonary hypertension associated with end stage renal disease on dialysis (HCC) 04/16/2014  . Hyperkalemia   . Hypothyroidism   . Osteomyelitis (HCC)      Diet Order:  Diet renal with fluid  restriction Fluid restriction:: 1200 mL Fluid; Room service appropriate?: Yes; Fluid consistency:: Thin   Energy Intake: recorded po intake 82% of meals on average; pt reports good appetite at present  Skin:  Wound (see comment) (multiple pressure ulcers including stage III sacrum)  Last BM:  04/08/16   Labs: reviewed, no BMP since 5/28  Meds: ss novolog  Height:   Ht Readings from Last 1 Encounters:  04/04/16 5\' 10"  (1.778 m)    Weight: pt reports he thinks he has lost weight but does not know how much; weight stable per weight encounters but pt with significant edema and may be masking wt loss  Wt Readings from Last 1 Encounters:  04/07/16 183 lb 3.2 oz (83.1 kg)    Ideal Body Weight:      Wt Readings from Last 10 Encounters:  04/07/16  183 lb 3.2 oz (83.1 kg)  02/01/16  184 lb 11.9 oz (83.8 kg)  01/16/16  189 lb 2.5 oz (85.8 kg)  12/18/15  200 lb (90.719 kg)  11/15/15  195 lb (88.451 kg)  10/17/15  179 lb 14.3 oz (81.6 kg)  08/09/15  185 lb (83.915 kg)  07/08/15  185 lb (83.915 kg)  06/13/15  190 lb (86.183 kg)  06/10/15  185 lb (83.915 kg)   BMI:  Body mass index is 26.29 kg/(m^2).  Estimated Nutritional Needs:   Kcal:  2400-2800 kcals   Protein:  125-166 g  Fluid:  1000 mL plus output    08/10/15 MS, RD, LDN [phone] Pager  [phone] Weekend/On-Call Pager

## 2016-04-08 NOTE — Progress Notes (Signed)
CSW received a telephone call form Jomarie Longs- Admissions Coordinator at [facility]. CSW updated him on patient's status. CSW will continue to follow and assist.   Woodroe Mode, MSW, LCSW-A Clinical Social Work Department [phone]

## 2016-04-08 NOTE — Progress Notes (Signed)
[facility]  ROUNDING NOTE   Subjective:   Hemodialysis yesterday. Tolerated treatment well. UF 2 litres.   Very weak today. Not recognizing faces.   Objective:  Vital signs in last 24 hours:  Temp:  [98 F (36.7 C)-98.3 F (36.8 C)] 98 F (36.7 C) (05/31 0407) Pulse Rate:  [103-111] 105 (05/31 0407) Resp:  [16-20] 20 (05/31 0407) BP: (107-175)/(49-83) 121/59 mmHg (05/31 0407) SpO2:  [100 %] 100 % (05/31 0407) Weight:  [83.1 kg (183 lb 3.2 oz)] 83.1 kg (183 lb 3.2 oz) (05/30 1325)  Weight change:  Filed Weights   04/04/16 1702 04/04/16 2142 04/07/16 1325  Weight: 84.3 kg (185 lb 13.6 oz) 83.099 kg (183 lb 3.2 oz) 83.1 kg (183 lb 3.2 oz)    Intake/Output: I/O last 3 completed shifts: In: 550 [Blood:500; IV Piggyback:50] Out: 2055 [Urine:55; Other:2000]   Intake/Output this shift:  Total I/O In: 360 [P.O.:360] Out: -   Physical Exam: General: Lethargic but arousable   Head: Normocephalic, atraumatic. Moist oral mucosal membranes  Eyes: Anicteric  Neck: Supple, trachea midline  Lungs:  Clear to auscultation normal effort  Heart: S1S2 no rubs  Abdomen:  Soft, nontender, BS present   Extremities: 2+ peripheral edema.  Neurologic: Lethargic but arousable, confused  Skin: Dry skin noted on bilateral lower extremities   Access: LUE AVF    Basic Metabolic Panel:  Recent Labs Lab 04/04/16 1719 04/05/16 0712  NA 134* 130*  K 3.2* 3.4*  CL 93* 93*  CO2 30 25  GLUCOSE 195* 268*  BUN 15 18  CREATININE 1.93* 2.18*  CALCIUM 7.8* 7.4*    Liver Function Tests:  Recent Labs Lab 04/04/16 1719  AST 16  ALT 8*  ALKPHOS 431*  BILITOT 1.4*  PROT 5.6*  ALBUMIN 1.5*   No results for input(s): LIPASE, AMYLASE in the last 168 hours. No results for input(s): AMMONIA in the last 168 hours.  CBC:  Recent Labs Lab 04/04/16 1719 04/05/16 0712 04/07/16 0447 04/08/16 0436  WBC 27.8* 22.9* 20.5* 19.6*  NEUTROABS 26.9*  --   --  18.3*  HGB 7.6*  7.0* 6.9* 8.1*  HCT 25.0* 23.2* 22.4* 26.3*  MCV 88.6 88.8 88.7 87.8  PLT 163 127* 107* 98*    Cardiac Enzymes:  Recent Labs Lab 04/04/16 1719  TROPONINI 0.07*    BNP: Invalid input(s): POCBNP  CBG:  Recent Labs Lab 04/07/16 0728 04/07/16 1123 04/07/16 1615 04/07/16 2115 04/08/16 0740  GLUCAP 187* 216* 121* 80 156*    Microbiology: Results for orders placed or performed during the hospital encounter of 04/04/16  Urine culture     Status: Abnormal   Collection Time: 04/04/16  5:16 PM  Result Value Ref Range Status   Specimen Description URINE, RANDOM  Final   Special Requests NONE  Final   Culture MULTIPLE SPECIES PRESENT, SUGGEST RECOLLECTION (A)  Final   Report Status 04/06/2016 FINAL  Final  Blood Culture (routine x 2)     Status: Abnormal (Preliminary result)   Collection Time: 04/04/16  5:19 PM  Result Value Ref Range Status   Specimen Description BLOOD RIGHT HAND  Final   Special Requests BOTTLES DRAWN AEROBIC AND ANAEROBIC  6CC  Final   Culture  Setup Time   Final    GRAM NEGATIVE RODS GRAM POSITIVE COCCI IN PAIRS IN CHAINS IN BOTH AEROBIC AND ANAEROBIC BOTTLES CRITICAL RESULT CALLED TO, READ BACK BY AND VERIFIED WITH: ALLISON THARAKAN AT 0825 ON 04/05/16. CTJ  Culture (A)  Final    ESCHERICHIA COLI ENTEROCOCCUS SPECIES IN BOTH AEROBIC AND ANAEROBIC BOTTLES SUSCEPTIBILITIES TO FOLLOW FOR ORGANISM 2 ONCE ISOLATED    Report Status PENDING  Incomplete   Organism ID, Bacteria ESCHERICHIA COLI  Final      Susceptibility   Escherichia coli - MIC*    AMPICILLIN >=32 RESISTANT Resistant     CEFAZOLIN <=4 SENSITIVE Sensitive     CEFEPIME <=1 SENSITIVE Sensitive     CEFTAZIDIME <=1 SENSITIVE Sensitive     CEFTRIAXONE <=1 SENSITIVE Sensitive     CIPROFLOXACIN >=4 RESISTANT Resistant     GENTAMICIN <=1 SENSITIVE Sensitive     IMIPENEM <=0.25 SENSITIVE Sensitive     TRIMETH/SULFA <=20 SENSITIVE Sensitive     AMPICILLIN/SULBACTAM 4 SENSITIVE Sensitive      PIP/TAZO <=4 SENSITIVE Sensitive     Extended ESBL NEGATIVE Sensitive     * ESCHERICHIA COLI  Blood Culture (routine x 2)     Status: Abnormal (Preliminary result)   Collection Time: 04/04/16  5:19 PM  Result Value Ref Range Status   Specimen Description BLOOD RIGHT FOREARM  Final   Special Requests BOTTLES DRAWN AEROBIC AND ANAEROBIC  5CC  Final   Culture  Setup Time   Final    GRAM NEGATIVE RODS AEROBIC BOTTLE ONLY CRITICAL RESULT CALLED TO, READ BACK BY AND VERIFIED WITH: ALLISON THARAKAN AT 0825 ON 04/05/16. CTJ GRAM POSITIVE COCCI GRAM POSITIVE RODS ANAEROBIC BOTTLE ONLY CRITICAL RESULT CALLED TO, READ BACK BY AND VERIFIED WITH: ALLISON THARAKAN ON 04/05/16 AT 1130 BY QSD    Culture (A)  Final    ESCHERICHIA COLI ENTEROCOCCUS FAECALIS GRAM POSITIVE RODS IDENTIFICATION TO FOLLOW FOR ORGANISM 3    Report Status PENDING  Incomplete   Organism ID, Bacteria ESCHERICHIA COLI  Final   Organism ID, Bacteria ENTEROCOCCUS FAECALIS  Final      Susceptibility   Escherichia coli - MIC*    AMPICILLIN >=32 RESISTANT Resistant     CEFAZOLIN <=4 SENSITIVE Sensitive     CEFEPIME <=1 SENSITIVE Sensitive     CEFTAZIDIME <=1 SENSITIVE Sensitive     CEFTRIAXONE <=1 SENSITIVE Sensitive     CIPROFLOXACIN >=4 RESISTANT Resistant     GENTAMICIN <=1 SENSITIVE Sensitive     IMIPENEM <=0.25 SENSITIVE Sensitive     TRIMETH/SULFA <=20 SENSITIVE Sensitive     AMPICILLIN/SULBACTAM 4 SENSITIVE Sensitive     PIP/TAZO <=4 SENSITIVE Sensitive     Extended ESBL NEGATIVE Sensitive     * ESCHERICHIA COLI   Enterococcus faecalis - MIC*    AMPICILLIN <=2 RESISTANT Resistant     VANCOMYCIN 1 SENSITIVE Sensitive     GENTAMICIN SYNERGY RESISTANT Resistant     LINEZOLID 2 SENSITIVE Sensitive     * ENTEROCOCCUS FAECALIS  Blood Culture ID Panel (Reflexed)     Status: Abnormal   Collection Time: 04/04/16  5:19 PM  Result Value Ref Range Status   Enterococcus species DETECTED (A) NOT DETECTED Final    Comment:  CRITICAL RESULT CALLED TO, READ BACK BY AND VERIFIED WITH: ALLISON THARAKAN FOR ENTEROCOCCUS, ENTEROBACTERIACEAE AND E. COLI AT 0825 ON 04/05/16. CTJ    Vancomycin resistance NOT DETECTED NOT DETECTED Final   Listeria monocytogenes NOT DETECTED NOT DETECTED Final   Staphylococcus species NOT DETECTED NOT DETECTED Final   Staphylococcus aureus NOT DETECTED NOT DETECTED Final   Methicillin resistance NOT DETECTED NOT DETECTED Final   Streptococcus species NOT DETECTED NOT DETECTED Final   Streptococcus agalactiae NOT DETECTED  NOT DETECTED Final   Streptococcus pneumoniae NOT DETECTED NOT DETECTED Final   Streptococcus pyogenes NOT DETECTED NOT DETECTED Final   Acinetobacter baumannii NOT DETECTED NOT DETECTED Final   Enterobacteriaceae species DETECTED (A) NOT DETECTED Final   Enterobacter cloacae complex NOT DETECTED NOT DETECTED Final   Escherichia coli DETECTED (A) NOT DETECTED Final   Klebsiella oxytoca NOT DETECTED NOT DETECTED Final   Klebsiella pneumoniae NOT DETECTED NOT DETECTED Final   Proteus species NOT DETECTED NOT DETECTED Final   Serratia marcescens NOT DETECTED NOT DETECTED Final   Carbapenem resistance NOT DETECTED NOT DETECTED Final   Haemophilus influenzae NOT DETECTED NOT DETECTED Final   Neisseria meningitidis NOT DETECTED NOT DETECTED Final   Pseudomonas aeruginosa NOT DETECTED NOT DETECTED Final   Candida albicans NOT DETECTED NOT DETECTED Final   Candida glabrata NOT DETECTED NOT DETECTED Final   Candida krusei NOT DETECTED NOT DETECTED Final   Candida parapsilosis NOT DETECTED NOT DETECTED Final   Candida tropicalis NOT DETECTED NOT DETECTED Final  C difficile quick scan w PCR reflex     Status: Abnormal   Collection Time: 04/05/16 11:57 AM  Result Value Ref Range Status   C Diff antigen POSITIVE (A) NEGATIVE Final   C Diff toxin NEGATIVE NEGATIVE Final   C Diff interpretation   Final    Positive for toxigenic C. difficile, active toxin production not  detected. Patient has toxigenic C. difficile organisms present in the bowel, but toxin was not detected. The patient may be a carrier or the level of toxin in the sample was below the limit  of detection. This information should be used in conjunction with the patient's clinical history when deciding on possible therapy.     Comment: CRITICAL RESULT CALLED TO, READ BACK BY AND VERIFIED WITH: TAYLOR BECK @ 1410 04/05/16 BY [facility]   Clostridium Difficile by PCR     Status: Abnormal   Collection Time: 04/05/16 11:57 AM  Result Value Ref Range Status   Toxigenic C Difficile by pcr POSITIVE (A) NEGATIVE Final    Comment: CRITICAL RESULT CALLED TO, READ BACK BY AND VERIFIED WITH: TAYLOR BECK @ 1410 04/05/16 BY T[facility]   MRSA PCR Screening     Status: None   Collection Time: 04/05/16 10:20 PM  Result Value Ref Range Status   MRSA by PCR NEGATIVE NEGATIVE Final    Comment:        The GeneXpert MRSA Assay (FDA approved for NASAL specimens only), is one component of a comprehensive MRSA colonization surveillance program. It is not intended to diagnose MRSA infection nor to guide or monitor treatment for MRSA infections.     Coagulation Studies: No results for input(s): LABPROT, INR in the last 72 hours.  Urinalysis: No results for input(s): COLORURINE, LABSPEC, PHURINE, GLUCOSEU, HGBUR, BILIRUBINUR, KETONESUR, PROTEINUR, UROBILINOGEN, NITRITE, LEUKOCYTESUR in the last 72 hours.  Invalid input(s): APPERANCEUR    Imaging: US Renal  04/06/2016  CLINICAL DATA:  History of urinary retention. EXAM: RENAL / URINARY TRACT ULTRASOUND COMPLETE COMPARISON:  CT of 02/20/2015 FINDINGS: Right Kidney: Length: 9.6 cm. No hydronephrosis. Renal cortical thinning and increased echogenicity. Left Kidney: Length: 9.0 cm. No hydronephrosis. Renal cortical thinning and increased echogenicity. Bladder: Collapsed around a Foley catheter. Incidental note is made of large amount of ascites. Bilateral pleural effusions.  Splenomegaly, with splenic volume of 680 cc. IMPRESSION: 1. Bladder collapsed around a Foley catheter. 2. Medical renal disease, without hydronephrosis. 3. Ascites and bilateral pleural effusions, suggesting fluid overload.  4. Splenomegaly. Electronically Signed   By: Jeronimo Greaves M.D.   On: 04/06/2016 10:42     Medications:     . sodium chloride   Intravenous Once  . ampicillin (OMNIPEN) IV  2 g Intravenous Q12H  . calcium-vitamin D  1 tablet Oral Q breakfast  . epoetin (EPOGEN/PROCRIT) injection  10,000 Units Intravenous Q T,Th,Sa-HD  . gentamicin irrigation   Irrigation BID  . heparin  5,000 Units Subcutaneous Q8H  . insulin aspart  0-15 Units Subcutaneous TID WC  . insulin aspart  0-5 Units Subcutaneous QHS  . levothyroxine  100 mcg Oral QHS  . liothyronine  10 mcg Oral Daily  . meropenem (MERREM) IV  500 mg Intravenous Daily  . omega-3 acid ethyl esters  1 g Oral Daily  . pantoprazole  40 mg Oral BID  . pregabalin  75 mg Oral BID  . rosuvastatin  10 mg Oral QHS  . sodium hypochlorite   Irrigation BID  . vancomycin  125 mg Oral QID   acetaminophen **OR** acetaminophen, albuterol, midodrine, morphine injection, ondansetron **OR** ondansetron (ZOFRAN) IV, traMADol  Assessment/ Plan:  45 y.o. 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  1. End-stage renal disease on hematemesis Tuesday, Thursday, Saturday.   - Hemodialysis for tomorrow.   2. Sepsis.  Both Escherichia coli and enterococcusin blood culture. Suprapubic catheter changed 04/05/16. Wound and/or urine as source - Appreciate ID, urology and wound care input.  - gentamicin irrigation of wounds - Vanco with HD treatment.   3. Anemia of chronic kidney disease. Scheduled for PRBC transfusion 5/30 with hemodialysis treatment.  - epo with HD  treatment  4. Secondary hyperparathyroidism. PTH 85 and phos 1.3 outpatient - discontinued cinacalcet.    LOS: 4 Taylor [name] 5/31/20179:50 AM

## 2016-04-08 NOTE — Care Management (Addendum)
Dialysis Taylor [name], Sat. Positive for CDiff and UTI. IV antibiotics. Possible surgical debridement of decubiti.

## 2016-04-09 DIAGNOSIS — E440 Moderate protein-calorie malnutrition: Secondary | ICD-10-CM | POA: Diagnosis present

## 2016-04-09 LAB — CULTURE, BLOOD (ROUTINE X 2)

## 2016-04-09 LAB — GLUCOSE, CAPILLARY
GLUCOSE-CAPILLARY: 212 mg/dL — AB (ref 65–99)
Glucose-Capillary: 167 mg/dL — ABNORMAL HIGH (ref 65–99)
Glucose-Capillary: 224 mg/dL — ABNORMAL HIGH (ref 65–99)
Glucose-Capillary: 156 mg/dL — ABNORMAL HIGH (ref 65–99)

## 2016-04-09 MED ORDER — SODIUM CHLORIDE 0.9 % IV SOLN: 100.0000 mL | INTRAVENOUS | Status: DC | PRN

## 2016-04-09 MED ORDER — ALTEPLASE 2 MG IJ SOLR: 2.0000 mg | Freq: Once | INTRAMUSCULAR | Status: DC | PRN

## 2016-04-09 MED ORDER — PENTAFLUOROPROP-TETRAFLUOROETH EX AERO
1.0000 "application " | INHALATION_SPRAY | CUTANEOUS | Status: DC | PRN
  Filled 2016-04-09: qty 30

## 2016-04-09 MED ORDER — LIDOCAINE-PRILOCAINE 2.5-2.5 % EX CREA
1.0000 "application " | TOPICAL_CREAM | CUTANEOUS | Status: DC | PRN
  Filled 2016-04-09: qty 5

## 2016-04-09 MED ORDER — HEPARIN SODIUM (PORCINE) 1000 UNIT/ML DIALYSIS
1000.0000 [IU] | INTRAMUSCULAR | Status: DC | PRN
  Filled 2016-04-09: qty 1

## 2016-04-09 MED ORDER — SODIUM CHLORIDE 0.9 % IV SOLN
100.0000 mL | INTRAVENOUS | Status: DC | PRN
  Administered 2016-04-10: 08:00:00 via INTRAVENOUS

## 2016-04-09 MED ORDER — LIDOCAINE HCL (PF) 1 % IJ SOLN
5.0000 mL | INTRAMUSCULAR | Status: DC | PRN
  Filled 2016-04-09: qty 5

## 2016-04-09 NOTE — Progress Notes (Signed)
A/OX3 overnight; bladder irrigation tolerated; sacral dressing changed with Dakins solution, day three completed per wound care RN orders; slept good portion overnight; no acute distress or c/o pain overnight; Windy Carina, RN; 6:16 AM; 04/09/2016

## 2016-04-09 NOTE — Progress Notes (Signed)
Patient refused dialysis treatment today.Patient stated " I'm not doing dialysis today,I'm scheduled for a surgery tomorrow." This writer educated patient on fact that today is his dialysis day and he have not had treatment since Tuesday. Patient stated, "I will pick back up on Saturday, I am scheduled for surgery tomorrow and it makes me feel bad to do dialysis the day before I have surgery." This writer once again educated patient on importance of completing treatment today,explained that he will only have 2 days of treatment this week, patient stated, "Forget it, im not doing dialysis the day before my surgery, I will do it Saturday." Dr.Kolluru was notifed via telephone.Patient was left with no distress noted.

## 2016-04-09 NOTE — Progress Notes (Signed)
CC: uroSepsis and decubitus ulcer Subjective: This a patient admitted the hospital with urosepsis who also has a decubitus ulcer. He has no complaints today is feeling better overall No fevers or chills Objective: Vital signs in last 24 hours: Temp:  [97.8 F (36.6 C)-98 F (36.7 C)] 97.8 F (36.6 C) (05/31 2217) Pulse Rate:  [102-107] 107 (05/31 2217) Resp:  [16-18] 18 (05/31 2217) BP: (126-130)/(67-74) 126/74 mmHg (05/31 2217) SpO2:  [100 %] 100 % (05/31 2217) Last BM Date: 04/08/16  Intake/Output from previous day: 05/31 0701 - 06/01 0700 In: 1100 [P.O.:600] Out: -  Intake/Output this shift:    Physical exam:  Vital signs are reviewed and stable. Wake alert and oriented Abdomen soft nontender Extremities show moderate edema lower extremity digital amputations healed well Decubitus is dressed  Lab Results: CBC   Recent Labs  04/07/16 0447 04/08/16 0436  WBC 20.5* 19.6*  HGB 6.9* 8.1*  HCT 22.4* 26.3*  PLT 107* 98*   BMET No results for input(s): NA, K, CL, CO2, GLUCOSE, BUN, CREATININE, CALCIUM in the last 72 hours. PT/INR No results for input(s): LABPROT, INR in the last 72 hours. ABG No results for input(s): PHART, HCO3 in the last 72 hours.  Invalid input(s): PCO2, PO2  Studies/Results: No results found.  Anti-infectives: Anti-infectives    Start     Dose/Rate Route Frequency Ordered Stop   04/09/16 1200  vancomycin (VANCOCIN) IVPB 1000 mg/200 mL premix     1,000 mg 200 mL/hr over 60 Minutes Intravenous Every T-Th-Sa (Hemodialysis) 04/08/16 1359     04/09/16 1200  cefTAZidime (FORTAZ) 2 g in dextrose 5 % 50 mL IVPB     2 g 100 mL/hr over 30 Minutes Intravenous Every T-Th-Sa (Hemodialysis) 04/08/16 1410     04/08/16 1800  cefTAZidime (FORTAZ) 1 g in dextrose 5 % 50 mL IVPB  Status:  Discontinued     1 g 100 mL/hr over 30 Minutes Intravenous Every 24 hours 04/08/16 1238 04/08/16 1410   04/08/16 1415  cefTAZidime (FORTAZ) 2 g in dextrose 5 % 50 mL  IVPB     2 g 100 mL/hr over 30 Minutes Intravenous  Once 04/08/16 1410 04/08/16 1737   04/08/16 1400  metroNIDAZOLE (FLAGYL) tablet 500 mg     500 mg Oral Every 8 hours 04/08/16 1235     04/08/16 1330  vancomycin (VANCOCIN) 2,000 mg in sodium chloride 0.9 % 500 mL IVPB     2,000 mg 250 mL/hr over 120 Minutes Intravenous  Once 04/08/16 1241 04/08/16 1549   04/06/16 1800  vancomycin (VANCOCIN) 50 mg/mL oral solution 125 mg  Status:  Discontinued     125 mg Oral 4 times daily 04/06/16 1552 04/08/16 1231   04/05/16 2200  gentamicin (GARAMYCIN) 80 mg in sodium chloride irrigation 0.9 % 500 mL irrigation      Irrigation 2 times daily 04/05/16 1546 04/08/16 2247   04/05/16 1800  ampicillin (OMNIPEN) 2 g in sodium chloride 0.9 % 50 mL IVPB  Status:  Discontinued     2 g 150 mL/hr over 20 Minutes Intravenous Every 12 hours 04/05/16 1015 04/08/16 1231   04/05/16 1800  metroNIDAZOLE (FLAGYL) tablet 500 mg  Status:  Discontinued     500 mg Oral Every 6 hours 04/05/16 1449 04/05/16 1554   04/05/16 1600  metroNIDAZOLE (FLAGYL) tablet 500 mg  Status:  Discontinued     500 mg Oral Every 8 hours 04/05/16 1554 04/06/16 1552   04/05/16 1030  meropenem (MERREM)  500 mg in sodium chloride 0.9 % 50 mL IVPB  Status:  Discontinued     500 mg 100 mL/hr over 30 Minutes Intravenous Daily 04/05/16 1015 04/08/16 1231   04/05/16 0500  piperacillin-tazobactam (ZOSYN) IVPB 3.375 g  Status:  Discontinued     3.375 g 12.5 mL/hr over 240 Minutes Intravenous Every 12 hours 04/04/16 2049 04/05/16 0842   04/04/16 2100  vancomycin (VANCOCIN) IVPB 750 mg/150 ml premix     750 mg 150 mL/hr over 60 Minutes Intravenous  Once 04/04/16 2047 04/05/16 0026   04/04/16 1730  piperacillin-tazobactam (ZOSYN) IVPB 3.375 g     3.375 g 100 mL/hr over 30 Minutes Intravenous  Once 04/04/16 1716 04/04/16 1847   04/04/16 1730  vancomycin (VANCOCIN) IVPB 1000 mg/200 mL premix     1,000 mg 200 mL/hr over 60 Minutes Intravenous  Once 04/04/16  1716 04/04/16 1847      Assessment/Plan:  's patient with urosepsis who has improved considerably he does have a decubitus ulcer which requires debridement and probable wound VAC placement is custom with him the rationale for this and since he has been off his aspirin plasm Plavix we could proceed to the operating room tomorrow. I will attempt to schedule tomorrow for debridement Options of observation reviewed the risk of bleeding and infection and continued wound care was all discussed with him he understood and agreed to proceed  Lattie Haw, MD, FACS  04/09/2016

## 2016-04-09 NOTE — Progress Notes (Signed)
Subjective: Admitted with sepsis and has bacteremia. Awake. Afebrile. Patient will have debridement of his decubitus ulcer tomorrow.  Objective: Vital signs in last 24 hours: Temp:  [97.8 F (36.6 C)] 97.8 F (36.6 C) (05/31 2217) Pulse Rate:  [107] 107 (05/31 2217) Resp:  [18] 18 (05/31 2217) BP: (126)/(74) 126/74 mmHg (05/31 2217) SpO2:  [100 %] 100 % (05/31 2217) Weight change:  Last BM Date: 04/08/16  Intake/Output from previous day: 05/31 0701 - 06/01 0700 In: 1100 [P.O.:600] Out: -  Intake/Output this shift:    General appearance: cooperative, no distress and . Head: Normocephalic, without obvious abnormality, atraumatic Resp: clear to auscultation bilaterally and normal percussion bilaterally Cardio: regular rate and rhythm and no rub GI: soft, non-tender; bowel sounds normal; no masses,  no organomegaly Pelvic: Suprapubic cath in place.  Extremities: edema lower ext Skin: large stage 4 sacral decubitis ulcer Neurologic: Grossly normal  Lab Results:  Recent Labs  04/07/16 0447 04/08/16 0436  WBC 20.5* 19.6*  HGB 6.9* 8.1*  HCT 22.4* 26.3*  PLT 107* 98*   BMET No results for input(s): NA, K, CL, CO2, GLUCOSE, BUN, CREATININE, CALCIUM in the last 72 hours.  Studies/Results: No results found.  Medications:  Scheduled: . calcium-vitamin D  1 tablet Oral Q breakfast  . cefTAZidime (FORTAZ)  IV  2 g Intravenous Q T,Th,Sa-HD  . epoetin (EPOGEN/PROCRIT) injection  10,000 Units Intravenous Q T,Th,Sa-HD  . feeding supplement (ENSURE ENLIVE)  237 mL Oral TID WC  . feeding supplement (PRO-STAT SUGAR FREE 64)  30 mL Oral BID BM  . heparin  5,000 Units Subcutaneous Q8H  . insulin aspart  0-15 Units Subcutaneous TID WC  . insulin aspart  0-5 Units Subcutaneous QHS  . levothyroxine  100 mcg Oral QHS  . liothyronine  10 mcg Oral Daily  . metroNIDAZOLE  500 mg Oral Q8H  . omega-3 acid ethyl esters  1 g Oral Daily  . pantoprazole  40 mg Oral BID  . pregabalin  75  mg Oral BID  . rosuvastatin  10 mg Oral QHS  . vancomycin  1,000 mg Intravenous Q T,Th,Sa-HD   Continuous:    Assessment/Plan:  * Escherichia coli and enterococcus bacteremia and UTI with sepsis Changed to vancomycin and Ceftzidime. Discussed with Dr. Sampson Goon. Seen by urology and started on gentamicin irrigation of the bladder. Echocardiogram showed no endocarditis. WBC count improving.  * C. Difficile infection On PO Fagyl  * ESRD: HD per nephrology. Getting hemodialysis per nephrology.  * Anemia of chronic disease S/p PRBC 04/07/2016.  * Sacral Decubitis Ulcer: Present on admission. Wound care has placed on air mattres. Needs debridement. Scheduled for tomorrow Discussed with Dr. Excell Seltzer  * Severe Spinal Stenosis: Appears comfortable.  * Suprapubic cath Cath changed by urology. Now getting gentamicin flushes.  Time spent 30 min   LOS: 5 days   Taylor [name], Taylor [name] 04/09/2016, 12:13 PM

## 2016-04-09 NOTE — Progress Notes (Signed)
[facility]  ROUNDING NOTE   Subjective:   Confused and lethargic today.  For surgery tomorrow For hemodialysis for later today.   Objective:  Vital signs in last 24 hours:  Temp:  [97.8 F (36.6 C)-98 F (36.7 C)] 97.8 F (36.6 C) (05/31 2217) Pulse Rate:  [102-107] 107 (05/31 2217) Resp:  [16-18] 18 (05/31 2217) BP: (126-130)/(67-74) 126/74 mmHg (05/31 2217) SpO2:  [100 %] 100 % (05/31 2217)  Weight change:  Filed Weights   04/04/16 1702 04/04/16 2142 04/07/16 1325  Weight: 84.3 kg (185 lb 13.6 oz) 83.099 kg (183 lb 3.2 oz) 83.1 kg (183 lb 3.2 oz)    Intake/Output: I/O last 3 completed shifts: In: 1150 [P.O.:600; Other:500; IV Piggyback:50] Out: 55 [Urine:55]   Intake/Output this shift:     Physical Exam: General: Lethargic but arousable   Head: Normocephalic, atraumatic. Moist oral mucosal membranes  Eyes: Anicteric  Neck: Supple, trachea midline  Lungs:  Clear to auscultation normal effort  Heart: S1S2 no rubs  Abdomen:  Soft, nontender, BS present   Extremities: 2+ peripheral edema.  Neurologic: Lethargic but arousable, confused  Skin: Sacral decubitus  Access: LUE AVF    Basic Metabolic Panel:  Recent Labs Lab 04/04/16 1719 04/05/16 0712  NA 134* 130*  K 3.2* 3.4*  CL 93* 93*  CO2 30 25  GLUCOSE 195* 268*  BUN 15 18  CREATININE 1.93* 2.18*  CALCIUM 7.8* 7.4*    Liver Function Tests:  Recent Labs Lab 04/04/16 1719  AST 16  ALT 8*  ALKPHOS 431*  BILITOT 1.4*  PROT 5.6*  ALBUMIN 1.5*   No results for input(s): LIPASE, AMYLASE in the last 168 hours. No results for input(s): AMMONIA in the last 168 hours.  CBC:  Recent Labs Lab 04/04/16 1719 04/05/16 0712 04/07/16 0447 04/08/16 0436  WBC 27.8* 22.9* 20.5* 19.6*  NEUTROABS 26.9*  --   --  18.3*  HGB 7.6* 7.0* 6.9* 8.1*  HCT 25.0* 23.2* 22.4* 26.3*  MCV 88.6 88.8 88.7 87.8  PLT 163 127* 107* 98*    Cardiac Enzymes:  Recent Labs Lab 04/04/16 1719   TROPONINI 0.07*    BNP: Invalid input(s): POCBNP  CBG:  Recent Labs Lab 04/08/16 1141 04/08/16 1532 04/08/16 1650 04/08/16 2149 04/09/16 0746  GLUCAP 188* 224* 247* 204* 167*    Microbiology: Results for orders placed or performed during the hospital encounter of 04/04/16  Urine culture     Status: Abnormal   Collection Time: 04/04/16  5:16 PM  Result Value Ref Range Status   Specimen Description URINE, RANDOM  Final   Special Requests NONE  Final   Culture MULTIPLE SPECIES PRESENT, SUGGEST RECOLLECTION (A)  Final   Report Status 04/06/2016 FINAL  Final  Blood Culture (routine x 2)     Status: Abnormal   Collection Time: 04/04/16  5:19 PM  Result Value Ref Range Status   Specimen Description BLOOD RIGHT HAND  Final   Special Requests BOTTLES DRAWN AEROBIC AND ANAEROBIC  6CC  Final   Culture  Setup Time   Final    GRAM NEGATIVE RODS GRAM POSITIVE COCCI IN PAIRS IN CHAINS IN BOTH AEROBIC AND ANAEROBIC BOTTLES CRITICAL RESULT CALLED TO, READ BACK BY AND VERIFIED WITH: ALLISON THARAKAN AT 0825 ON 04/05/16. CTJ    Culture (A)  Final    ESCHERICHIA COLI ENTEROCOCCUS FAECALIS IN BOTH AEROBIC AND ANAEROBIC BOTTLES    Report Status 04/09/2016 FINAL  Final   Organism ID, Bacteria ESCHERICHIA  COLI  Final   Organism ID, Bacteria ENTEROCOCCUS FAECALIS  Final      Susceptibility   Escherichia coli - MIC*    AMPICILLIN >=32 RESISTANT Resistant     CEFAZOLIN <=4 SENSITIVE Sensitive     CEFEPIME <=1 SENSITIVE Sensitive     CEFTAZIDIME <=1 SENSITIVE Sensitive     CEFTRIAXONE <=1 SENSITIVE Sensitive     CIPROFLOXACIN >=4 RESISTANT Resistant     GENTAMICIN <=1 SENSITIVE Sensitive     IMIPENEM <=0.25 SENSITIVE Sensitive     TRIMETH/SULFA <=20 SENSITIVE Sensitive     AMPICILLIN/SULBACTAM 4 SENSITIVE Sensitive     PIP/TAZO <=4 SENSITIVE Sensitive     Extended ESBL NEGATIVE Sensitive     * ESCHERICHIA COLI   Enterococcus faecalis - MIC*    AMPICILLIN <=2 SENSITIVE Sensitive      VANCOMYCIN 1 SENSITIVE Sensitive     GENTAMICIN SYNERGY RESISTANT Resistant     LINEZOLID 2 SENSITIVE Sensitive     * ENTEROCOCCUS FAECALIS  Blood Culture (routine x 2)     Status: Abnormal (Preliminary result)   Collection Time: 04/04/16  5:19 PM  Result Value Ref Range Status   Specimen Description BLOOD RIGHT FOREARM  Final   Special Requests BOTTLES DRAWN AEROBIC AND ANAEROBIC  5CC  Final   Culture  Setup Time   Final    GRAM NEGATIVE RODS AEROBIC BOTTLE ONLY CRITICAL RESULT CALLED TO, READ BACK BY AND VERIFIED WITH: ALLISON THARAKAN AT 0825 ON 04/05/16. CTJ GRAM POSITIVE COCCI GRAM POSITIVE RODS ANAEROBIC BOTTLE ONLY CRITICAL RESULT CALLED TO, READ BACK BY AND VERIFIED WITH: ALLISON THARAKAN ON 04/05/16 AT 1130 BY QSD    Culture (A)  Final    ESCHERICHIA COLI ENTEROCOCCUS FAECALIS GRAM POSITIVE RODS IDENTIFICATION TO FOLLOW FOR ORGANISM 3    Report Status PENDING  Incomplete   Organism ID, Bacteria ESCHERICHIA COLI  Final   Organism ID, Bacteria ENTEROCOCCUS FAECALIS  Final      Susceptibility   Escherichia coli - MIC*    AMPICILLIN >=32 RESISTANT Resistant     CEFAZOLIN <=4 SENSITIVE Sensitive     CEFEPIME <=1 SENSITIVE Sensitive     CEFTAZIDIME <=1 SENSITIVE Sensitive     CEFTRIAXONE <=1 SENSITIVE Sensitive     CIPROFLOXACIN >=4 RESISTANT Resistant     GENTAMICIN <=1 SENSITIVE Sensitive     IMIPENEM <=0.25 SENSITIVE Sensitive     TRIMETH/SULFA <=20 SENSITIVE Sensitive     AMPICILLIN/SULBACTAM 4 SENSITIVE Sensitive     PIP/TAZO <=4 SENSITIVE Sensitive     Extended ESBL NEGATIVE Sensitive     * ESCHERICHIA COLI   Enterococcus faecalis - MIC*    AMPICILLIN <=2 RESISTANT Resistant     VANCOMYCIN 1 SENSITIVE Sensitive     GENTAMICIN SYNERGY RESISTANT Resistant     LINEZOLID 2 SENSITIVE Sensitive     * ENTEROCOCCUS FAECALIS  Blood Culture ID Panel (Reflexed)     Status: Abnormal   Collection Time: 04/04/16  5:19 PM  Result Value Ref Range Status   Enterococcus species  DETECTED (A) NOT DETECTED Final    Comment: CRITICAL RESULT CALLED TO, READ BACK BY AND VERIFIED WITH: ALLISON THARAKAN FOR ENTEROCOCCUS, ENTEROBACTERIACEAE AND E. COLI AT 0825 ON 04/05/16. CTJ    Vancomycin resistance NOT DETECTED NOT DETECTED Final   Listeria monocytogenes NOT DETECTED NOT DETECTED Final   Staphylococcus species NOT DETECTED NOT DETECTED Final   Staphylococcus aureus NOT DETECTED NOT DETECTED Final   Methicillin resistance NOT DETECTED NOT DETECTED Final  Streptococcus species NOT DETECTED NOT DETECTED Final   Streptococcus agalactiae NOT DETECTED NOT DETECTED Final   Streptococcus pneumoniae NOT DETECTED NOT DETECTED Final   Streptococcus pyogenes NOT DETECTED NOT DETECTED Final   Acinetobacter baumannii NOT DETECTED NOT DETECTED Final   Enterobacteriaceae species DETECTED (A) NOT DETECTED Final   Enterobacter cloacae complex NOT DETECTED NOT DETECTED Final   Escherichia coli DETECTED (A) NOT DETECTED Final   Klebsiella oxytoca NOT DETECTED NOT DETECTED Final   Klebsiella pneumoniae NOT DETECTED NOT DETECTED Final   Proteus species NOT DETECTED NOT DETECTED Final   Serratia marcescens NOT DETECTED NOT DETECTED Final   Carbapenem resistance NOT DETECTED NOT DETECTED Final   Haemophilus influenzae NOT DETECTED NOT DETECTED Final   Neisseria meningitidis NOT DETECTED NOT DETECTED Final   Pseudomonas aeruginosa NOT DETECTED NOT DETECTED Final   Candida albicans NOT DETECTED NOT DETECTED Final   Candida glabrata NOT DETECTED NOT DETECTED Final   Candida krusei NOT DETECTED NOT DETECTED Final   Candida parapsilosis NOT DETECTED NOT DETECTED Final   Candida tropicalis NOT DETECTED NOT DETECTED Final  C difficile quick scan w PCR reflex     Status: Abnormal   Collection Time: 04/05/16 11:57 AM  Result Value Ref Range Status   C Diff antigen POSITIVE (A) NEGATIVE Final   C Diff toxin NEGATIVE NEGATIVE Final   C Diff interpretation   Final    Positive for toxigenic C.  difficile, active toxin production not detected. Patient has toxigenic C. difficile organisms present in the bowel, but toxin was not detected. The patient may be a carrier or the level of toxin in the sample was below the limit  of detection. This information should be used in conjunction with the patient's clinical history when deciding on possible therapy.     Comment: CRITICAL RESULT CALLED TO, READ BACK BY AND VERIFIED WITH: TAYLOR BECK @ 1410 04/05/16 BY T[facility]   Clostridium Difficile by PCR     Status: Abnormal   Collection Time: 04/05/16 11:57 AM  Result Value Ref Range Status   Toxigenic C Difficile by pcr POSITIVE (A) NEGATIVE Final    Comment: CRITICAL RESULT CALLED TO, READ BACK BY AND VERIFIED WITH: TAYLOR BECK @ 1410 04/05/16 BY TCH   MRSA PCR Screening     Status: None   Collection Time: 04/05/16 10:20 PM  Result Value Ref Range Status   MRSA by PCR NEGATIVE NEGATIVE Final    Comment:        The GeneXpert MRSA Assay (FDA approved for NASAL specimens only), is one component of a comprehensive MRSA colonization surveillance program. It is not intended to diagnose MRSA infection nor to guide or monitor treatment for MRSA infections.     Coagulation Studies: No results for input(s): LABPROT, INR in the last 72 hours.  Urinalysis: No results for input(s): COLORURINE, LABSPEC, PHURINE, GLUCOSEU, HGBUR, BILIRUBINUR, KETONESUR, PROTEINUR, UROBILINOGEN, NITRITE, LEUKOCYTESUR in the last 72 hours.  Invalid input(s): APPERANCEUR    Imaging: No results found.   Medications:     . calcium-vitamin D  1 tablet Oral Q breakfast  . cefTAZidime (FORTAZ)  IV  2 g Intravenous Q T,Th,Sa-HD  . epoetin (EPOGEN/PROCRIT) injection  10,000 Units Intravenous Q T,Th,Sa-HD  . feeding supplement (ENSURE ENLIVE)  237 mL Oral TID WC  . feeding supplement (PRO-STAT SUGAR FREE 64)  30 mL Oral BID BM  . heparin  5,000 Units Subcutaneous Q8H  . insulin aspart  0-15 Units Subcutaneous TID WC   .  insulin aspart  0-5 Units Subcutaneous QHS  . levothyroxine  100 mcg Oral QHS  . liothyronine  10 mcg Oral Daily  . metroNIDAZOLE  500 mg Oral Q8H  . omega-3 acid ethyl esters  1 g Oral Daily  . pantoprazole  40 mg Oral BID  . pregabalin  75 mg Oral BID  . rosuvastatin  10 mg Oral QHS  . vancomycin  1,000 mg Intravenous Q T,Th,Sa-HD   sodium chloride, sodium chloride, acetaminophen **OR** acetaminophen, albuterol, alteplase, heparin, lidocaine (PF), lidocaine-prilocaine, midodrine, morphine injection, ondansetron **OR** ondansetron (ZOFRAN) IV, pentafluoroprop-tetrafluoroeth, traMADol  Assessment/ Plan:  45 y.o. 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 TTS  1. End-stage renal disease on hemodialysisTuesday, Thursday, Saturday.   - Hemodialysis for later today. Continue TTS schedule.   2. Sepsis.  Both Escherichia coli and enterococcusin blood culture. Suprapubic catheter changed 04/05/16. Wound and/or urine as source - Appreciate ID, urology and wound care input. Surgery for I&D tomorrow.   - gentamicin irrigation of wounds   3. Anemia of chronic kidney disease. Scheduled for PRBC transfusion 5/30 with hemodialysis treatment.  - epo with HD treatment  4. Secondary hyperparathyroidism. PTH 85 and phos 1.3 outpatient - discontinued cinacalcet.    LOS: 5 Taylor [name] 6/1/201710:39 AM

## 2016-04-10 ENCOUNTER — Encounter: Payer: Self-pay | Admitting: *Deleted

## 2016-04-10 ENCOUNTER — Inpatient Hospital Stay: Payer: Medicare Other | Admitting: Anesthesiology

## 2016-04-10 ENCOUNTER — Encounter
Admission: EM | Disposition: A | Discharge status: Skilled Nursing Facility | Payer: Self-pay | Source: Home / Self Care | Attending: Internal Medicine

## 2016-04-10 DIAGNOSIS — L8990 Pressure ulcer of unspecified site, unspecified stage: Secondary | ICD-10-CM | POA: Insufficient documentation

## 2016-04-10 HISTORY — PX: DEBRIDMENT OF DECUBITUS ULCER: SHX6276

## 2016-04-10 HISTORY — PX: APPLICATION OF WOUND VAC: SHX5189

## 2016-04-10 LAB — CULTURE, BLOOD (ROUTINE X 2)

## 2016-04-10 LAB — GLUCOSE, CAPILLARY
GLUCOSE-CAPILLARY: 111 mg/dL — AB (ref 65–99)
GLUCOSE-CAPILLARY: 144 mg/dL — AB (ref 65–99)
GLUCOSE-CAPILLARY: 193 mg/dL — AB (ref 65–99)
Glucose-Capillary: 221 mg/dL — ABNORMAL HIGH (ref 65–99)

## 2016-04-10 SURGERY — DEBRIDMENT OF DECUBITUS ULCER
Anesthesia: General | Wound class: Dirty or Infected

## 2016-04-10 MED ORDER — MIDAZOLAM HCL 2 MG/2ML IJ SOLN
INTRAMUSCULAR | Status: DC | PRN
  Administered 2016-04-10: 1 mg via INTRAVENOUS

## 2016-04-10 MED ORDER — SILVER SULFADIAZINE 1 % EX CREA
TOPICAL_CREAM | CUTANEOUS | Status: AC
  Filled 2016-04-10: qty 85

## 2016-04-10 MED ORDER — SUGAMMADEX SODIUM 200 MG/2ML IV SOLN
INTRAVENOUS | Status: DC | PRN
  Administered 2016-04-10: 166 mg via INTRAVENOUS

## 2016-04-10 MED ORDER — ONDANSETRON HCL 4 MG/2ML IJ SOLN: 4.0000 mg | Freq: Once | INTRAMUSCULAR | Status: DC | PRN

## 2016-04-10 MED ORDER — SILVER SULFADIAZINE 1 % EX CREA
TOPICAL_CREAM | CUTANEOUS | Status: DC | PRN
  Administered 2016-04-10: 1 via TOPICAL

## 2016-04-10 MED ORDER — EPHEDRINE SULFATE 50 MG/ML IJ SOLN
INTRAMUSCULAR | Status: DC | PRN
  Administered 2016-04-10: 10 mg via INTRAVENOUS
  Administered 2016-04-10: 30 mg via INTRAVENOUS

## 2016-04-10 MED ORDER — FENTANYL CITRATE (PF) 100 MCG/2ML IJ SOLN
INTRAMUSCULAR | Status: DC | PRN
  Administered 2016-04-10: 50 ug via INTRAVENOUS

## 2016-04-10 MED ORDER — ROCURONIUM BROMIDE 100 MG/10ML IV SOLN
INTRAVENOUS | Status: DC | PRN
  Administered 2016-04-10: 30 mg via INTRAVENOUS

## 2016-04-10 MED ORDER — PHENYLEPHRINE HCL 10 MG/ML IJ SOLN
INTRAMUSCULAR | Status: DC | PRN
  Administered 2016-04-10: 100 ug via INTRAVENOUS
  Administered 2016-04-10: 200 ug via INTRAVENOUS
  Administered 2016-04-10: 100 ug via INTRAVENOUS
  Administered 2016-04-10 (×2): 200 ug via INTRAVENOUS

## 2016-04-10 MED ORDER — FENTANYL CITRATE (PF) 100 MCG/2ML IJ SOLN: 25.0000 ug | INTRAMUSCULAR | Status: DC | PRN

## 2016-04-10 MED ORDER — LACTATED RINGERS IV SOLN
INTRAVENOUS | Status: DC | PRN
  Administered 2016-04-10: 08:00:00 via INTRAVENOUS

## 2016-04-10 MED ORDER — PROPOFOL 10 MG/ML IV BOLUS
INTRAVENOUS | Status: DC | PRN
  Administered 2016-04-10: 150 mg via INTRAVENOUS

## 2016-04-10 MED ORDER — LIDOCAINE HCL (CARDIAC) 20 MG/ML IV SOLN
INTRAVENOUS | Status: DC | PRN
  Administered 2016-04-10: 100 mg via INTRAVENOUS

## 2016-04-10 MED ORDER — FENTANYL CITRATE (PF) 100 MCG/2ML IJ SOLN: INTRAMUSCULAR | Status: DC | PRN

## 2016-04-10 SURGICAL SUPPLY — 24 items
BLADE SURG 15 STRL LF DISP TIS (BLADE) ×1 IMPLANT
BLADE SURG 15 STRL SS (BLADE) ×3
CHLORAPREP W/TINT 26ML (MISCELLANEOUS) ×3 IMPLANT
DRAPE LAPAROTOMY 100X77 ABD (DRAPES) ×3 IMPLANT
DRAPE UTILITY 15X26 TOWEL STRL (DRAPES) ×3 IMPLANT
ELECT CAUTERY BLADE 6.4 (BLADE) ×3 IMPLANT
ELECT REM PT RETURN 9FT ADLT (ELECTROSURGICAL) ×3
ELECTRODE REM PT RTRN 9FT ADLT (ELECTROSURGICAL) ×1 IMPLANT
GAUZE SPONGE 4X4 12PLY STRL (GAUZE/BANDAGES/DRESSINGS) ×3 IMPLANT
GLOVE BIO SURGEON STRL SZ8 (GLOVE) ×3 IMPLANT
GOWN STRL REUS W/ TWL LRG LVL3 (GOWN DISPOSABLE) ×2 IMPLANT
GOWN STRL REUS W/TWL LRG LVL3 (GOWN DISPOSABLE) ×6
KIT RM TURNOVER STRD PROC AR (KITS) ×3 IMPLANT
LABEL OR SOLS (LABEL) ×3 IMPLANT
NDL HYPO 25X1 1.5 SAFETY (NEEDLE) ×1 IMPLANT
NEEDLE HYPO 25X1 1.5 SAFETY (NEEDLE) ×3 IMPLANT
NS IRRIG 500ML POUR BTL (IV SOLUTION) ×3 IMPLANT
PACK BASIN MINOR ARMC (MISCELLANEOUS) ×3 IMPLANT
PAD ABD DERMACEA PRESS 5X9 (GAUZE/BANDAGES/DRESSINGS) ×8 IMPLANT
SPONGE LAP 18X18 5 PK (GAUZE/BANDAGES/DRESSINGS) ×5 IMPLANT
SUT VIC AB 3-0 SH 27 (SUTURE) ×3
SUT VIC AB 3-0 SH 27X BRD (SUTURE) IMPLANT
SYR BULB EAR ULCER 3OZ GRN STR (SYRINGE) ×3 IMPLANT
SYRINGE 10CC LL (SYRINGE) ×3 IMPLANT

## 2016-04-10 NOTE — Progress Notes (Signed)
Preoperative Review   Patient is met in the preoperative holding area. The history is reviewed in the chart and with the patient. I personally reviewed the options and rationale as well as the risks of this procedure that have been previously discussed with the patient. All questions asked by the patient and/or family were answered to their satisfaction.  Patient agrees to proceed with this procedure at this time.  Jarquavious Fentress E Titianna Loomis M.D. FACS  

## 2016-04-10 NOTE — Progress Notes (Signed)
Subjective: Admitted with sepsis and has bacteremia.  Afebrile. Drowzy after surgery   Objective: Vital signs in last 24 hours: Temp:  [97.2 F (36.2 C)-98.4 F (36.9 C)] 97.4 F (36.3 C) (06/02 1119) Pulse Rate:  [99-108] 99 (06/02 1119) Resp:  [13-20] 20 (06/02 1119) BP: (87-126)/(57-73) 111/63 mmHg (06/02 1119) SpO2:  [99 %-100 %] 100 % (06/02 1119) Weight:  [83.008 kg (183 lb)] 83.008 kg (183 lb) (06/02 0723) Weight change:  Last BM Date: 04/09/16  Intake/Output from previous day: 06/01 0701 - 06/02 0700 In: -  Out: 450 [Urine:450] Intake/Output this shift: Total I/O In: 550 [I.V.:550] Out: 10 [Blood:10]  General appearance: cooperative, no distress and . Head: Normocephalic, without obvious abnormality, atraumatic Resp: clear to auscultation bilaterally and normal percussion bilaterally Cardio: regular rate and rhythm and no rub GI: soft, non-tender; bowel sounds normal; no masses,  no organomegaly Pelvic: Suprapubic cath in place.  Extremities: edema lower ext Skin: large stage 4 sacral decubitis ulcer Neurologic: Grossly normal  Lab Results:  Recent Labs  04/08/16 0436  WBC 19.6*  HGB 8.1*  HCT 26.3*  PLT 98*   BMET No results for input(s): NA, K, CL, CO2, GLUCOSE, BUN, CREATININE, CALCIUM in the last 72 hours.  Studies/Results: No results found.  Medications:  Scheduled: . calcium-vitamin D  1 tablet Oral Q breakfast  . cefTAZidime (FORTAZ)  IV  2 g Intravenous Q T,Th,Sa-HD  . epoetin (EPOGEN/PROCRIT) injection  10,000 Units Intravenous Q T,Th,Sa-HD  . feeding supplement (ENSURE ENLIVE)  237 mL Oral TID WC  . feeding supplement (PRO-STAT SUGAR FREE 64)  30 mL Oral BID BM  . heparin  5,000 Units Subcutaneous Q8H  . insulin aspart  0-15 Units Subcutaneous TID WC  . insulin aspart  0-5 Units Subcutaneous QHS  . levothyroxine  100 mcg Oral QHS  . liothyronine  10 mcg Oral Daily  . metroNIDAZOLE  500 mg Oral Q8H  . omega-3 acid ethyl esters  1 g  Oral Daily  . pantoprazole  40 mg Oral BID  . pregabalin  75 mg Oral BID  . rosuvastatin  10 mg Oral QHS  . vancomycin  1,000 mg Intravenous Q T,Th,Sa-HD   Continuous:    Assessment/Plan:  * Escherichia coli and enterococcus bacteremia and UTI with sepsis Changed to vancomycin and Ceftzidime. Discussed with Dr. Sampson Goon. Seen by urology and started on gentamicin irrigation of the bladder. Echocardiogram showed no endocarditis. WBC count improving.  * C. Difficile infection On PO Fagyl  * ESRD: HD per nephrology. Getting hemodialysis per nephrology.  * Anemia of chronic disease S/p PRBC 04/07/2016.  * Sacral Decubitis Ulcer: Present on admission. Wound care has placed on air mattres. S/p debridement today  * Severe Spinal Stenosis: Appears comfortable.  * Suprapubic cath Cath changed by urology.  gentamicin flushes.  Likely discharge 1-2 days  Time spent 30 min   LOS: 6 days   Taylor [name] R 04/10/2016, 1:00 PM

## 2016-04-10 NOTE — Anesthesia Preprocedure Evaluation (Signed)
Anesthesia Evaluation  Patient identified by MRN, date of birth, ID band Patient awake    Reviewed: Allergy & Precautions, NPO status , Patient's Chart, lab work & pertinent test results  History of Anesthesia Complications Negative for: history of anesthetic complications  Airway Mallampati: III       Dental  (+) Poor Dentition, Missing   Pulmonary shortness of breath and with exertion, neg sleep apnea, pneumonia, resolved, neg COPD, neg recent URI,           Cardiovascular Exercise Tolerance: Poor hypertension, + CAD, + Past MI, + CABG, + Peripheral Vascular Disease and +CHF  (-) Cardiac Stents (-) dysrhythmias + Valvular Problems/Murmurs  Rhythm:Regular     Neuro/Psych neg Seizures PSYCHIATRIC DISORDERS  Neuromuscular disease (Peripheral neuropathy) negative psych ROS   GI/Hepatic Neg liver ROS, hiatal hernia, GERD  ,  Endo/Other  diabetesHypothyroidism   Renal/GU ESRF and DialysisRenal disease (last dialysis on Tuesday)  negative genitourinary   Musculoskeletal negative musculoskeletal ROS (+) Arthritis ,   Abdominal   Peds negative pediatric ROS (+)  Hematology  (+) anemia ,   Anesthesia Other Findings Past Medical History:   End stage renal disease on dialysis (HCC)                      Comment:LUE fistula   Type I diabetes mellitus (HCC)                                 Comment:a. 03/2014 admitted with HNK to [facility]. b. TTS   Diabetic neuropathy (HCC)                                      Comment:severe, s/p multiple toe amputation   Hypothyroidism                                               Hypertension                                                 Hyperlipidemia                                               H/O hiatal hernia                                            GERD (gastroesophageal reflux disease)                       Anxiety                                                      Sebaceous cyst  Comment:side of neck   Pneumonia                                                      Comment:2010   Anemia                                                         Comment:a. req PRBC's 2011.   PAD (peripheral artery disease) (HCC)                          Comment:a. s/p amputation of toes on the right;  b.               left LE claudication.   Coronary artery disease                                        Comment:a. s/p MI;  b. 10/2009 CABG x 3 @ [facility]:               LIMA->LAD, VG->OM3, VG->RPDA; c. 11/2010 Cath               3/3 patent grafts;  d 12/2012 Cath: LM 30d, LAD               85p, D1 70, D2 90, LCX 40ost, OM2 100, RCA 90p,              100m, L->LAD ok, VG->OM3 ok, VG->RPDA 30, EF               50%->Med Rx.   Cataract                                                       Comment:right   Valvular disease                                               Comment:a. 11/2012 Echo: EF 55-60%, mild LVH, mild MR,GeoffPateGeoffPGeoffGGeoffPatent CammieAlbertine Tristan SchroeDRonnArchie PKentuckyatElpiRoopvilleSchwalbeeAlbertine Tristan SchroeDRonnArchie PKentuckyatElpi Kor59ead           valve w/ nl thicknes of leaflets, mild MR, mild              bi-atrial dilatation, RV mild dilatation - wall              thickness nl, mod TR - select images appears to  be mod to sev, PASP at least mod elevated.   Diastolic CHF (HCC)                                            Comment:see echo above   Depression                                                   Arthritis                                                      Comment:rheumatoid arthritis    Myocardial infarction (HCC)                     2010         Pulmonary HTN (HCC)                                            Comment:a. continuation of 03/2014 echo PASP @ least mod              elevated. RVSP 52 mm Hg. Parasternal long axis               estimated @ 85 mm Hg    Scrotal abscess                                                Comment:a. s/p multiple I&D   Penile abscess                                                 Comment:a. s/p multiple I&D   Hematemesis                                                    Comment:a. EGD 2016 with LA Grade C esophagitis,               continued on Protonix    Chronic respiratory failure (HCC)                              Comment:a. on 3L oxygen via nasal cannula   Anxiety                                                      Acute delirium  Sepsis (HCC)                                                 Urethrocutaneous fistula in male                             MYOCARDIAL INFARCTION, HX OF                    01/26/2011      Comment:Qualifier: Diagnosis of  By: [name] MD, Talia     HTN (hypertension)                              02/11/2011     Pulmonary hypertension associated with end sta* 04/16/2014     Hyperkalemia                                                 Hypothyroidism                                               Osteomyelitis (HCC)                                          Reproductive/Obstetrics negative OB ROS                             Anesthesia Physical  Anesthesia Plan  ASA: IV  Anesthesia Plan: General   Post-op Pain Management:    Induction: Intravenous  Airway Management Planned:   Additional Equipment:   Intra-op Plan:   Post-operative Plan:   Informed Consent: I have reviewed the patients History and Physical, chart, labs and discussed the procedure including the risks, benefits and alternatives for the proposed anesthesia with the patient or authorized representative who has indicated his/her understanding and acceptance.   Dental advisory given  Plan Discussed with: Anesthesiologist, CRNA and Surgeon  Anesthesia Plan Comments:         Anesthesia Quick Evaluation

## 2016-04-10 NOTE — Consult Note (Signed)
Pharmacy Antibiotic Note  REES SANTISTEVAN is a 45 y.o. male admitted on 04/04/2016 with bacteremia.  Pharmacy has been consulted for vancomyicn and ceftazidime dosing. Patient receives regularly scheduled HD on Tuesday/Thursday, Saturday. Patient receiving metronidazole 500mg  PO Q8hr for clostridium difficile.   Plan: Continue vancomycin 1g IV with dialysis on dialysis days. Will obtain trough prior to dialysis on 6/6.   Will continue ceftazidime 2g after dialysis on dialysis days.   Height: 5\' 10"  (177.8 cm) Weight: 183 lb (83.008 kg) IBW/kg (Calculated) : 73  Temp (24hrs), Avg:97.6 F (36.4 C), Min:97.2 F (36.2 C), Max:98.4 F (36.9 C)   Recent Labs Lab 04/04/16 1716 04/04/16 1719 04/05/16 0712 04/07/16 0447 04/08/16 0436  WBC  --  27.8* 22.9* 20.5* 19.6*  CREATININE  --  1.93* 2.18*  --   --   LATICACIDVEN 1.5  --   --   --   --     Estimated Creatinine Clearance: 43.3 mL/min (by C-G formula based on Cr of 2.18).    Allergies  Allergen Reactions  . Amoxicillin-Pot Clavulanate Nausea And Vomiting and Itching  . Contrast Media [Iodinated Diagnostic Agents] Other (See Comments)    Unknown reaction  . 2nd Skin Quick Heal Other (See Comments)  . Rifampin Nausea And Vomiting  . Tape Other (See Comments)    Antimicrobials this admission: Antibiotics Given (last 72 hours)    Date/Time Action Medication Dose Rate   04/08/16 0855 Given   meropenem (MERREM) 500 mg in sodium chloride 0.9 % 50 mL IVPB 500 mg 100 mL/hr   04/08/16 0855 Given   ampicillin (OMNIPEN) 2 g in sodium chloride 0.9 % 50 mL IVPB 2 g 150 mL/hr   04/08/16 0855 Given   gentamicin (GARAMYCIN) 80 mg in sodium chloride irrigation 0.9 % 500 mL irrigation     04/08/16 0855 Given   vancomycin (VANCOCIN) 50 mg/mL oral solution 125 mg 125 mg    04/08/16 1349 Given   metroNIDAZOLE (FLAGYL) tablet 500 mg 500 mg    04/08/16 1349 Given   vancomycin (VANCOCIN) 2,000 mg in sodium chloride 0.9 % 500 mL IVPB  2,000 mg 250 mL/hr   04/08/16 1707 Given   cefTAZidime (FORTAZ) 2 g in dextrose 5 % 50 mL IVPB 2 g 100 mL/hr   04/08/16 2206 Given   metroNIDAZOLE (FLAGYL) tablet 500 mg 500 mg    04/08/16 2247 Given   gentamicin (GARAMYCIN) 80 mg in sodium chloride irrigation 0.9 % 500 mL irrigation     04/09/16 0602 Given   metroNIDAZOLE (FLAGYL) tablet 500 mg 500 mg    04/09/16 1411 Given   metroNIDAZOLE (FLAGYL) tablet 500 mg 500 mg    04/09/16 1411 Given   vancomycin (VANCOCIN) IVPB 1000 mg/200 mL premix 1,000 mg 200 mL/hr   04/09/16 2237 Given   metroNIDAZOLE (FLAGYL) tablet 500 mg 500 mg    04/10/16 0551 Given   metroNIDAZOLE (FLAGYL) tablet 500 mg 500 mg    04/10/16 1310 Given   metroNIDAZOLE (FLAGYL) tablet 500 mg 500 mg        Dose adjustments this admission:   Microbiology results: 5/27 BCx: ecoli, enterococcus 5/27 UCx: mult spec   5/27 MRSA PCR: neg cdiff positive  Pharmacy will continue to monitor and adjust per consult.    Zannah Melucci L 04/10/2016 10:05 PM

## 2016-04-10 NOTE — Progress Notes (Signed)
Received call from OR RN Cala Bradford regarding patient's surgery scheduled for this AM; all questions answered and no concerns at this time. Nursing staff will continue to monitor. Lamonte Richer, RN

## 2016-04-10 NOTE — Consult Note (Signed)
   [facility] CM Inpatient Consult   04/10/2016  Taylor [name] Dec 01, 1970 [id number]   Patient screened for potential [facility] Care Management services. Patient is eligible for [facility] Services and was an active participant in the past. He is not currently active related to he is a resident of a long term care facility. Electronic medical record reveals patient's discharge plan is to return to long term care facility  and there were no identifiable [facility] care management needs at this time. [facility] Care Management services not appropriate at this time. If patient's post hospital needs change please place a [facility] Care Management consult. For questions please contact:   Ariannah Arenson RN, BSN [facility] Liaison  [phone]) Business Mobile [phone]) Toll free office

## 2016-04-10 NOTE — Anesthesia Postprocedure Evaluation (Signed)
Anesthesia Post Note  Patient: Taylor [name]  Procedure(s) Performed: Procedure(s) (LRB): DEBRIDMENT OF DECUBITUS ULCER OF SACRUM (N/A) APPLICATION OF WOUND VAC (N/A)  Patient location during evaluation: PACU Anesthesia Type: General Level of consciousness: awake and alert Pain management: pain level controlled Vital Signs Assessment: post-procedure vital signs reviewed and stable Respiratory status: spontaneous breathing, nonlabored ventilation, respiratory function stable and patient connected to nasal cannula oxygen Cardiovascular status: blood pressure returned to baseline and stable Postop Assessment: no signs of nausea or vomiting Anesthetic complications: no    Last Vitals:  Filed Vitals:   04/10/16 1020 04/10/16 1119  BP: 115/62 111/63  Pulse: 102 99  Temp: 36.3 C 36.3 C  Resp: 20 20    Last Pain:  Filed Vitals:   04/10/16 1119  PainSc: Asleep                 Lenard Simmer

## 2016-04-10 NOTE — Transfer of Care (Signed)
Immediate Anesthesia Transfer of Care Note  Patient: Taylor [name]  Procedure(s) Performed: Procedure(s): DEBRIDMENT OF DECUBITUS ULCER OF SACRUM (N/A) APPLICATION OF WOUND VAC (N/A)  Patient Location: PACU  Anesthesia Type:General  Level of Consciousness: awake, alert  and oriented  Airway & Oxygen Therapy: Patient Spontanous Breathing and Patient connected to face mask oxygen  Post-op Assessment: Report given to RN and Post -op Vital signs reviewed and stable  Post vital signs: Reviewed and stable  Last Vitals:  Filed Vitals:   04/10/16 0937 04/10/16 0949  BP: 97/59   Pulse:    Temp:  36.2 C  Resp: 16 13    Last Pain:  Filed Vitals:   04/10/16 0950  PainSc: Asleep      Patients Stated Pain Goal: 0 (04/07/16 2348)  Complications: No apparent anesthesia complications

## 2016-04-10 NOTE — Progress Notes (Signed)
Dressing on sacrum changed and PO antibiotic given per OR request. Patient has remained NPO since midnight and consent form has been signed. Patient resting comfortably in his bed. Nursing staff will continue to monitor. Lamonte Richer, RN

## 2016-04-10 NOTE — Op Note (Signed)
04/04/2016 - 04/10/2016  9:01 AM  PATIENT:  Taylor [name]  45 y.o. male  PRE-OPERATIVE DIAGNOSIS:  Decubitus ulcer  POST-OPERATIVE DIAGNOSIS:  Sacral decubitus measuring 8 x 15 cm and left if she'll decubitus measuring 3 x 5 cm  PROCEDURE: Treatment of sacral and ischial decubitus ulcers  SURGEON:  Lattie Haw MD, FACS   ANESTHESIA:  gen   Details of Procedure: This patient with urosepsis is admitted with known cubitus ulcers requiring debridement. Preoperative discussed rationale for surgery the options of observation risk bleeding infection ongoing wound care etc. this is all reviewed for him in the preop holding area as well he understood and agreed to proceed.  Findings sacral decubitus measuring 8 x 15 full-thickness to bone of's coccyx and left if she'll full-thickness ulcer without visible bony involvement.  Description of procedure patient was taken to the operating room after obtaining informed consent he was induced general anesthesia and then placed in a well padded prone position dressings were removed he was prepped and draped in the sterile fashion. A surgical post was obtained. Necrotic tissue over both the a she'll and sacral decubitus were removed with electrocautery dissection and dissection down to mostly viable tissue was performed there was still fibber no purulent material on the sacrum itself which could not be removed without exposing periosteum. Once assuring that hemostasis was adequate the wound was dressed with Silvadene. Prior to doing this 2 separate areas of arterial bleeding were suture ligated with figure-of-eight 30 Vicryls but the majority of the hemostasis was with electrocautery. Silvadene dressing was placed with ABG pads. He was taken the recovery room stable condition to be admitted for continued care needle counts correct   Lattie Haw, MD FACS

## 2016-04-10 NOTE — Progress Notes (Signed)
MD Wieting was made of aware large loose BM. Rectal tube was ordered.

## 2016-04-10 NOTE — Progress Notes (Addendum)
CSW updated Jomarie Longs- Insurance claims handler on patient's status. Edited patient's FL2 including Isolation Precaution and placed in MD's basket to be signed. CSW will continue to follow and assist.   Woodroe Mode, MSW, LCSW-A Clinical Social Work Department [phone]

## 2016-04-10 NOTE — Progress Notes (Signed)
[place] INFECTIOUS DISEASE PROGRESS NOTE Date of Admission:  04/04/2016     ID: Taylor [name] is a 45 y.o. male with infected decub and UTI Active Problems:   Pressure ulcer   Sepsis ([place])   Malnutrition of moderate degree   Decubital ulcer  Subjective: S/p I and D today.   ROS  Eleven systems are reviewed and negative except per hpi  Medications:  Antibiotics Given (last 72 hours)    Date/Time Action Medication Dose Rate   04/07/16 1527 Given   vancomycin (VANCOCIN) 50 mg/mL oral solution 125 mg 125 mg    04/07/16 1726 Given   vancomycin (VANCOCIN) 50 mg/mL oral solution 125 mg 125 mg    04/07/16 2127 Given   ampicillin (OMNIPEN) 2 g in sodium chloride 0.9 % 50 mL IVPB 2 g 150 mL/hr   04/07/16 2127 Given   vancomycin (VANCOCIN) 50 mg/mL oral solution 125 mg 125 mg    04/07/16 2128 Given   gentamicin (GARAMYCIN) 80 mg in sodium chloride irrigation 0.9 % 500 mL irrigation     04/08/16 0855 Given   meropenem (MERREM) 500 mg in sodium chloride 0.9 % 50 mL IVPB 500 mg 100 mL/hr   04/08/16 0855 Given   ampicillin (OMNIPEN) 2 g in sodium chloride 0.9 % 50 mL IVPB 2 g 150 mL/hr   04/08/16 0855 Given   gentamicin (GARAMYCIN) 80 mg in sodium chloride irrigation 0.9 % 500 mL irrigation     04/08/16 0855 Given   vancomycin (VANCOCIN) 50 mg/mL oral solution 125 mg 125 mg    04/08/16 1349 Given   metroNIDAZOLE (FLAGYL) tablet 500 mg 500 mg    04/08/16 1349 Given   vancomycin (VANCOCIN) 2,000 mg in sodium chloride 0.9 % 500 mL IVPB 2,000 mg 250 mL/hr   04/08/16 1707 Given   cefTAZidime (FORTAZ) 2 g in dextrose 5 % 50 mL IVPB 2 g 100 mL/hr   04/08/16 2206 Given   metroNIDAZOLE (FLAGYL) tablet 500 mg 500 mg    04/08/16 2247 Given   gentamicin (GARAMYCIN) 80 mg in sodium chloride irrigation 0.9 % 500 mL irrigation     04/09/16 0602 Given   metroNIDAZOLE (FLAGYL) tablet 500 mg 500 mg    04/09/16 1411 Given   metroNIDAZOLE (FLAGYL) tablet 500 mg 500 mg    04/09/16 1411  Given   vancomycin (VANCOCIN) IVPB 1000 mg/200 mL premix 1,000 mg 200 mL/hr   04/09/16 2237 Given   metroNIDAZOLE (FLAGYL) tablet 500 mg 500 mg    04/10/16 0551 Given   metroNIDAZOLE (FLAGYL) tablet 500 mg 500 mg    04/10/16 1310 Given   metroNIDAZOLE (FLAGYL) tablet 500 mg 500 mg      . calcium-vitamin D  1 tablet Oral Q breakfast  . cefTAZidime (FORTAZ)  IV  2 g Intravenous Q T,Th,Sa-HD  . epoetin (EPOGEN/PROCRIT) injection  10,000 Units Intravenous Q T,Th,Sa-HD  . feeding supplement (ENSURE ENLIVE)  237 mL Oral TID WC  . feeding supplement (PRO-STAT SUGAR FREE 64)  30 mL Oral BID BM  . heparin  5,000 Units Subcutaneous Q8H  . insulin aspart  0-15 Units Subcutaneous TID WC  . insulin aspart  0-5 Units Subcutaneous QHS  . levothyroxine  100 mcg Oral QHS  . liothyronine  10 mcg Oral Daily  . metroNIDAZOLE  500 mg Oral Q8H  . omega-3 acid ethyl esters  1 g Oral Daily  . pantoprazole  40 mg Oral BID  . pregabalin  75 mg Oral  BID  . rosuvastatin  10 mg Oral QHS  . vancomycin  1,000 mg Intravenous Q T,Th,Sa-HD    Objective: Vital signs in last 24 hours: Temp:  [97.2 F (36.2 C)-98.4 F (36.9 C)] 97.4 F (36.3 C) (06/02 1119) Pulse Rate:  [99-108] 99 (06/02 1119) Resp:  [13-20] 20 (06/02 1119) BP: (87-126)/(57-73) 111/63 mmHg (06/02 1119) SpO2:  [99 %-100 %] 100 % (06/02 1119) Weight:  [83.008 kg (183 lb)] 83.008 kg (183 lb) (06/02 0723) Constitutional: He is oriented to person, place, and time. But slowed mentation. Chronically ill appearing. HENT: anicteric Mouth/Throat: Oropharynx is clear and dry . No oropharyngeal exudate.  Cardiovascular: Normal rate, regular rhythm and normal heart sounds. E Pulmonary/Chest: Effort normal and breath sounds normal. No respiratory distress. He has no wheezes.  Abdominal: Soft. Bowel sounds are normal. Moderate distention SP cath in place with no drainage, clear urine Lymphadenopathy: He has no cervical adenopathy.  Neurological: He  is alert and oriented to person, place, and time. 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 His behavior is normal.   Lab Results  Recent Labs  04/08/16 0436  WBC 19.6*  HGB 8.1*  HCT 26.3*   Lab Results  Component Value Date   ESRSEDRATE 93* 04/07/2016   Lab Results  Component Value Date   CRP 20.5* 04/07/2016    Microbiology: Results for orders placed or performed during the hospital encounter of 04/04/16  Urine culture     Status: Abnormal   Collection Time: 04/04/16  5:16 PM  Result Value Ref Range Status   Specimen Description URINE, RANDOM  Final   Special Requests NONE  Final   Culture MULTIPLE SPECIES PRESENT, SUGGEST RECOLLECTION (A)  Final   Report Status 04/06/2016 FINAL  Final  Blood Culture (routine x 2)     Status: Abnormal   Collection Time: 04/04/16  5:19 PM  Result Value Ref Range Status   Specimen Description BLOOD RIGHT HAND  Final   Special Requests BOTTLES DRAWN AEROBIC AND ANAEROBIC  6CC  Final   Culture  Setup Time   Final    GRAM NEGATIVE RODS GRAM POSITIVE COCCI IN PAIRS IN CHAINS IN BOTH AEROBIC AND ANAEROBIC BOTTLES CRITICAL RESULT CALLED TO, READ BACK BY AND VERIFIED WITH: ALLISON THARAKAN AT [place] ON 04/05/16. CTJ    Culture (A)  Final    ESCHERICHIA COLI ENTEROCOCCUS FAECALIS IN BOTH AEROBIC AND ANAEROBIC BOTTLES    Report Status 04/09/2016 FINAL  Final   Organism ID, Bacteria ESCHERICHIA COLI  Final   Organism ID, Bacteria ENTEROCOCCUS FAECALIS  Final      Susceptibility   Escherichia coli - MIC*    AMPICILLIN >=32 RESISTANT Resistant     CEFAZOLIN <=4 SENSITIVE Sensitive     CEFEPIME <=1 SENSITIVE Sensitive     CEFTAZIDIME <=1 SENSITIVE Sensitive     CEFTRIAXONE <=1 SENSITIVE Sensitive     CIPROFLOXACIN >=4 RESISTANT Resistant     GENTAMICIN <=1 SENSITIVE Sensitive     IMIPENEM  <=0.25 SENSITIVE Sensitive     TRIMETH/SULFA <=20 SENSITIVE Sensitive     AMPICILLIN/SULBACTAM 4 SENSITIVE Sensitive     PIP/TAZO <=4 SENSITIVE Sensitive     Extended ESBL NEGATIVE Sensitive     * ESCHERICHIA COLI   Enterococcus faecalis - MIC*    AMPICILLIN <=2 SENSITIVE Sensitive     VANCOMYCIN  1 SENSITIVE Sensitive     GENTAMICIN SYNERGY RESISTANT Resistant     LINEZOLID 2 SENSITIVE Sensitive     * ENTEROCOCCUS FAECALIS  Blood Culture (routine x 2)     Status: Abnormal   Collection Time: 04/04/16  5:19 PM  Result Value Ref Range Status   Specimen Description BLOOD RIGHT FOREARM  Final   Special Requests BOTTLES DRAWN AEROBIC AND ANAEROBIC  5CC  Final   Culture  Setup Time   Final    GRAM NEGATIVE RODS AEROBIC BOTTLE ONLY CRITICAL RESULT CALLED TO, READ BACK BY AND VERIFIED WITH: ALLISON THARAKAN AT [place] ON 04/05/16. CTJ GRAM POSITIVE COCCI GRAM POSITIVE RODS ANAEROBIC BOTTLE ONLY CRITICAL RESULT CALLED TO, READ BACK BY AND VERIFIED WITH: ALLISON THARAKAN ON 04/05/16 AT 29 BY QSD    Culture (A)  Final    ESCHERICHIA COLI ENTEROCOCCUS FAECALIS GRAM POSITIVE RODS Results consistent with contamination.    Report Status 04/10/2016 FINAL  Final   Organism ID, Bacteria ESCHERICHIA COLI  Final   Organism ID, Bacteria ENTEROCOCCUS FAECALIS  Final      Susceptibility   Escherichia coli - MIC*    AMPICILLIN >=32 RESISTANT Resistant     CEFAZOLIN <=4 SENSITIVE Sensitive     CEFEPIME <=1 SENSITIVE Sensitive     CEFTAZIDIME <=1 SENSITIVE Sensitive     CEFTRIAXONE <=1 SENSITIVE Sensitive     CIPROFLOXACIN >=4 RESISTANT Resistant     GENTAMICIN <=1 SENSITIVE Sensitive     IMIPENEM <=0.25 SENSITIVE Sensitive     TRIMETH/SULFA <=20 SENSITIVE Sensitive     AMPICILLIN/SULBACTAM 4 SENSITIVE Sensitive     PIP/TAZO <=4 SENSITIVE Sensitive     Extended ESBL NEGATIVE Sensitive     * ESCHERICHIA COLI   Enterococcus faecalis - MIC*    AMPICILLIN <=2 RESISTANT Resistant     VANCOMYCIN 1  SENSITIVE Sensitive     GENTAMICIN SYNERGY RESISTANT Resistant     LINEZOLID 2 SENSITIVE Sensitive     * ENTEROCOCCUS FAECALIS  Blood Culture ID Panel (Reflexed)     Status: Abnormal   Collection Time: 04/04/16  5:19 PM  Result Value Ref Range Status   Enterococcus species DETECTED (A) NOT DETECTED Final    Comment: CRITICAL RESULT CALLED TO, READ BACK BY AND VERIFIED WITH: ALLISON THARAKAN FOR ENTEROCOCCUS, ENTEROBACTERIACEAE AND E. COLI AT 0825 ON 04/05/16. CTJ    Vancomycin resistance NOT DETECTED NOT DETECTED Final   Listeria monocytogenes NOT DETECTED NOT DETECTED Final   Staphylococcus species NOT DETECTED NOT DETECTED Final   Staphylococcus aureus NOT DETECTED NOT DETECTED Final   Methicillin resistance NOT DETECTED NOT DETECTED Final   Streptococcus species NOT DETECTED NOT DETECTED Final   Streptococcus agalactiae NOT DETECTED NOT DETECTED Final   Streptococcus pneumoniae NOT DETECTED NOT DETECTED Final   Streptococcus pyogenes NOT DETECTED NOT DETECTED Final   Acinetobacter baumannii NOT DETECTED NOT DETECTED Final   Enterobacteriaceae species DETECTED (A) NOT DETECTED Final   Enterobacter cloacae complex NOT DETECTED NOT DETECTED Final   Escherichia coli DETECTED (A) NOT DETECTED Final   Klebsiella oxytoca NOT DETECTED NOT DETECTED Final   Klebsiella pneumoniae NOT DETECTED NOT DETECTED Final   Proteus species NOT DETECTED NOT DETECTED Final   Serratia marcescens NOT DETECTED NOT DETECTED Final   Carbapenem resistance NOT DETECTED NOT DETECTED Final   Haemophilus influenzae NOT DETECTED NOT DETECTED Final   Neisseria meningitidis NOT DETECTED NOT DETECTED Final   Pseudomonas aeruginosa NOT DETECTED NOT DETECTED Final   Candida albicans  NOT DETECTED NOT DETECTED Final   Candida glabrata NOT DETECTED NOT DETECTED Final   Candida krusei NOT DETECTED NOT DETECTED Final   Candida parapsilosis NOT DETECTED NOT DETECTED Final   Candida tropicalis NOT DETECTED NOT DETECTED Final   C difficile quick scan w PCR reflex     Status: Abnormal   Collection Time: 04/05/16 11:57 AM  Result Value Ref Range Status   C Diff antigen POSITIVE (A) NEGATIVE Final   C Diff toxin NEGATIVE NEGATIVE Final   C Diff interpretation   Final    Positive for toxigenic C. difficile, active toxin production not detected. Patient has toxigenic C. difficile organisms present in the bowel, but toxin was not detected. The patient may be a carrier or the level of toxin in the sample was below the limit  of detection. This information should be used in conjunction with the patient's clinical history when deciding on possible therapy.     Comment: CRITICAL RESULT CALLED TO, READ BACK BY AND VERIFIED WITH: TAYLOR BECK @ 9150 04/05/16 BY [place]   Clostridium Difficile by PCR     Status: Abnormal   Collection Time: 04/05/16 11:57 AM  Result Value Ref Range Status   Toxigenic C Difficile by pcr POSITIVE (A) NEGATIVE Final    Comment: CRITICAL RESULT CALLED TO, READ BACK BY AND VERIFIED WITH: TAYLOR BECK @ 5697 04/05/16 BY [facility]   MRSA PCR Screening     Status: None   Collection Time: 04/05/16 10:20 PM  Result Value Ref Range Status   MRSA by PCR NEGATIVE NEGATIVE Final    Comment:        The GeneXpert MRSA Assay (FDA approved for NASAL specimens only), is one component of a comprehensive MRSA colonization surveillance program. It is not intended to diagnose MRSA infection nor to guide or monitor treatment for MRSA infections.      Studies/Results: No results found.  Assessment/Plan: Taylor [name] is a 45 y.o. male with a large sacral decubitus ulcer, E coli and enterococcal bacteremia and anaerobic GPR, UA with TNTC wbc (UCX mixed) as well as C diff. I suspect he is bacteremic from the Urine or wound (final ID of all bacteria in blood is pending) Clinically responding with no fever and wbc down to 20 from 28. Currently on amp, gent, meropenem and oral vanco ESR 93, CRP 20 May  30 June 2nd - s/p debridement of decubs by Dr Burt Knack Recommendations Would likely be able to treat for extended period with IV abx at HD - Vanco for the enterococcus and ceftazidime for the E coli. This would avoid having to place a line in ESRD HD patient.  Would rec a 6 week course of treatment with this Change oral vanco to oral flagyl to cover C diff and also provide anaerobic coverage - would rec 6 week course Thank you very much for the consult. Will follow with you.  [employer], Angelik Walls P   04/10/2016, 3:06 PM

## 2016-04-10 NOTE — Progress Notes (Signed)
[facility]  ROUNDING NOTE   Subjective:   Sacral decub I&D today by Dr. Excell Seltzer.  Did not do dialysis yesterday.   Objective:  Vital signs in last 24 hours:  Temp:  [97.2 F (36.2 C)-98.4 F (36.9 C)] 97.4 F (36.3 C) (06/02 1119) Pulse Rate:  [99-108] 99 (06/02 1119) Resp:  [13-20] 20 (06/02 1119) BP: (87-126)/(57-73) 111/63 mmHg (06/02 1119) SpO2:  [99 %-100 %] 100 % (06/02 1119) Weight:  [83.008 kg (183 lb)] 83.008 kg (183 lb) (06/02 0723)  Weight change:  Filed Weights   04/04/16 2142 04/07/16 1325 04/10/16 0723  Weight: 83.099 kg (183 lb 3.2 oz) 83.1 kg (183 lb 3.2 oz) 83.008 kg (183 lb)    Intake/Output: I/O last 3 completed shifts: In: 500 [Other:500] Out: 450 [Urine:450]   Intake/Output this shift:  Total I/O In: 550 [I.V.:550] Out: 10 [Blood:10]  Physical Exam: General: Lethargic but arousable   Head: Normocephalic, atraumatic. Moist oral mucosal membranes  Eyes: Anicteric  Neck: Supple, trachea midline  Lungs:  Clear to auscultation normal effort  Heart: S1S2 no rubs  Abdomen:  Soft, nontender, BS present   Extremities: 2+ peripheral edema.  Neurologic: Lethargic but arousable, confused  Skin: Sacral decubitus  Access: LUE AVF    Basic Metabolic Panel:  Recent Labs Lab 04/04/16 1719 04/05/16 0712  NA 134* 130*  K 3.2* 3.4*  CL 93* 93*  CO2 30 25  GLUCOSE 195* 268*  BUN 15 18  CREATININE 1.93* 2.18*  CALCIUM 7.8* 7.4*    Liver Function Tests:  Recent Labs Lab 04/04/16 1719  AST 16  ALT 8*  ALKPHOS 431*  BILITOT 1.4*  PROT 5.6*  ALBUMIN 1.5*   No results for input(s): LIPASE, AMYLASE in the last 168 hours. No results for input(s): AMMONIA in the last 168 hours.  CBC:  Recent Labs Lab 04/04/16 1719 04/05/16 0712 04/07/16 0447 04/08/16 0436  WBC 27.8* 22.9* 20.5* 19.6*  NEUTROABS 26.9*  --   --  18.3*  HGB 7.6* 7.0* 6.9* 8.1*  HCT 25.0* 23.2* 22.4* 26.3*  MCV 88.6 88.8 88.7 87.8  PLT 163 127* 107* 98*     Cardiac Enzymes:  Recent Labs Lab 04/04/16 1719  TROPONINI 0.07*    BNP: Invalid input(s): POCBNP  CBG:  Recent Labs Lab 04/09/16 1701 04/09/16 2041 04/10/16 0912 04/10/16 1115 04/10/16 1650  GLUCAP 212* 156* 111* 144* 193*    Microbiology: Results for orders placed or performed during the hospital encounter of 04/04/16  Urine culture     Status: Abnormal   Collection Time: 04/04/16  5:16 PM  Result Value Ref Range Status   Specimen Description URINE, RANDOM  Final   Special Requests NONE  Final   Culture MULTIPLE SPECIES PRESENT, SUGGEST RECOLLECTION (A)  Final   Report Status 04/06/2016 FINAL  Final  Blood Culture (routine x 2)     Status: Abnormal   Collection Time: 04/04/16  5:19 PM  Result Value Ref Range Status   Specimen Description BLOOD RIGHT HAND  Final   Special Requests BOTTLES DRAWN AEROBIC AND ANAEROBIC  6CC  Final   Culture  Setup Time   Final    GRAM NEGATIVE RODS GRAM POSITIVE COCCI IN PAIRS IN CHAINS IN BOTH AEROBIC AND ANAEROBIC BOTTLES CRITICAL RESULT CALLED TO, READ BACK BY AND VERIFIED WITH: ALLISON THARAKAN AT 0825 ON 04/05/16. CTJ    Culture (A)  Final    ESCHERICHIA COLI ENTEROCOCCUS FAECALIS IN BOTH AEROBIC AND ANAEROBIC BOTTLES  Report Status 04/09/2016 FINAL  Final   Organism ID, Bacteria ESCHERICHIA COLI  Final   Organism ID, Bacteria ENTEROCOCCUS FAECALIS  Final      Susceptibility   Escherichia coli - MIC*    AMPICILLIN >=32 RESISTANT Resistant     CEFAZOLIN <=4 SENSITIVE Sensitive     CEFEPIME <=1 SENSITIVE Sensitive     CEFTAZIDIME <=1 SENSITIVE Sensitive     CEFTRIAXONE <=1 SENSITIVE Sensitive     CIPROFLOXACIN >=4 RESISTANT Resistant     GENTAMICIN <=1 SENSITIVE Sensitive     IMIPENEM <=0.25 SENSITIVE Sensitive     TRIMETH/SULFA <=20 SENSITIVE Sensitive     AMPICILLIN/SULBACTAM 4 SENSITIVE Sensitive     PIP/TAZO <=4 SENSITIVE Sensitive     Extended ESBL NEGATIVE Sensitive     * ESCHERICHIA COLI   Enterococcus  faecalis - MIC*    AMPICILLIN <=2 SENSITIVE Sensitive     VANCOMYCIN 1 SENSITIVE Sensitive     GENTAMICIN SYNERGY RESISTANT Resistant     LINEZOLID 2 SENSITIVE Sensitive     * ENTEROCOCCUS FAECALIS  Blood Culture (routine x 2)     Status: Abnormal   Collection Time: 04/04/16  5:19 PM  Result Value Ref Range Status   Specimen Description BLOOD RIGHT FOREARM  Final   Special Requests BOTTLES DRAWN AEROBIC AND ANAEROBIC  5CC  Final   Culture  Setup Time   Final    GRAM NEGATIVE RODS AEROBIC BOTTLE ONLY CRITICAL RESULT CALLED TO, READ BACK BY AND VERIFIED WITH: ALLISON THARAKAN AT 0825 ON 04/05/16. CTJ GRAM POSITIVE COCCI GRAM POSITIVE RODS ANAEROBIC BOTTLE ONLY CRITICAL RESULT CALLED TO, READ BACK BY AND VERIFIED WITH: ALLISON THARAKAN ON 04/05/16 AT 1130 BY QSD    Culture (A)  Final    ESCHERICHIA COLI ENTEROCOCCUS FAECALIS GRAM POSITIVE RODS Results consistent with contamination.    Report Status 04/10/2016 FINAL  Final   Organism ID, Bacteria ESCHERICHIA COLI  Final   Organism ID, Bacteria ENTEROCOCCUS FAECALIS  Final      Susceptibility   Escherichia coli - MIC*    AMPICILLIN >=32 RESISTANT Resistant     CEFAZOLIN <=4 SENSITIVE Sensitive     CEFEPIME <=1 SENSITIVE Sensitive     CEFTAZIDIME <=1 SENSITIVE Sensitive     CEFTRIAXONE <=1 SENSITIVE Sensitive     CIPROFLOXACIN >=4 RESISTANT Resistant     GENTAMICIN <=1 SENSITIVE Sensitive     IMIPENEM <=0.25 SENSITIVE Sensitive     TRIMETH/SULFA <=20 SENSITIVE Sensitive     AMPICILLIN/SULBACTAM 4 SENSITIVE Sensitive     PIP/TAZO <=4 SENSITIVE Sensitive     Extended ESBL NEGATIVE Sensitive     * ESCHERICHIA COLI   Enterococcus faecalis - MIC*    AMPICILLIN <=2 RESISTANT Resistant     VANCOMYCIN 1 SENSITIVE Sensitive     GENTAMICIN SYNERGY RESISTANT Resistant     LINEZOLID 2 SENSITIVE Sensitive     * ENTEROCOCCUS FAECALIS  Blood Culture ID Panel (Reflexed)     Status: Abnormal   Collection Time: 04/04/16  5:19 PM  Result Value  Ref Range Status   Enterococcus species DETECTED (A) NOT DETECTED Final    Comment: CRITICAL RESULT CALLED TO, READ BACK BY AND VERIFIED WITH: ALLISON THARAKAN FOR ENTEROCOCCUS, ENTEROBACTERIACEAE AND E. COLI AT 0825 ON 04/05/16. CTJ    Vancomycin resistance NOT DETECTED NOT DETECTED Final   Listeria monocytogenes NOT DETECTED NOT DETECTED Final   Staphylococcus species NOT DETECTED NOT DETECTED Final   Staphylococcus aureus NOT DETECTED NOT DETECTED Final  Methicillin resistance NOT DETECTED NOT DETECTED Final   Streptococcus species NOT DETECTED NOT DETECTED Final   Streptococcus agalactiae NOT DETECTED NOT DETECTED Final   Streptococcus pneumoniae NOT DETECTED NOT DETECTED Final   Streptococcus pyogenes NOT DETECTED NOT DETECTED Final   Acinetobacter baumannii NOT DETECTED NOT DETECTED Final   Enterobacteriaceae species DETECTED (A) NOT DETECTED Final   Enterobacter cloacae complex NOT DETECTED NOT DETECTED Final   Escherichia coli DETECTED (A) NOT DETECTED Final   Klebsiella oxytoca NOT DETECTED NOT DETECTED Final   Klebsiella pneumoniae NOT DETECTED NOT DETECTED Final   Proteus species NOT DETECTED NOT DETECTED Final   Serratia marcescens NOT DETECTED NOT DETECTED Final   Carbapenem resistance NOT DETECTED NOT DETECTED Final   Haemophilus influenzae NOT DETECTED NOT DETECTED Final   Neisseria meningitidis NOT DETECTED NOT DETECTED Final   Pseudomonas aeruginosa NOT DETECTED NOT DETECTED Final   Candida albicans NOT DETECTED NOT DETECTED Final   Candida glabrata NOT DETECTED NOT DETECTED Final   Candida krusei NOT DETECTED NOT DETECTED Final   Candida parapsilosis NOT DETECTED NOT DETECTED Final   Candida tropicalis NOT DETECTED NOT DETECTED Final  C difficile quick scan w PCR reflex     Status: Abnormal   Collection Time: 04/05/16 11:57 AM  Result Value Ref Range Status   C Diff antigen POSITIVE (A) NEGATIVE Final   C Diff toxin NEGATIVE NEGATIVE Final   C Diff interpretation    Final    Positive for toxigenic C. difficile, active toxin production not detected. Patient has toxigenic C. difficile organisms present in the bowel, but toxin was not detected. The patient may be a carrier or the level of toxin in the sample was below the limit  of detection. This information should be used in conjunction with the patient's clinical history when deciding on possible therapy.     Comment: CRITICAL RESULT CALLED TO, READ BACK BY AND VERIFIED WITH: TAYLOR BECK @ 1410 04/05/16 BY T[facility]   Clostridium Difficile by PCR     Status: Abnormal   Collection Time: 04/05/16 11:57 AM  Result Value Ref Range Status   Toxigenic C Difficile by pcr POSITIVE (A) NEGATIVE Final    Comment: CRITICAL RESULT CALLED TO, READ BACK BY AND VERIFIED WITH: TAYLOR BECK @ 1410 04/05/16 BY TCH   MRSA PCR Screening     Status: None   Collection Time: 04/05/16 10:20 PM  Result Value Ref Range Status   MRSA by PCR NEGATIVE NEGATIVE Final    Comment:        The GeneXpert MRSA Assay (FDA approved for NASAL specimens only), is one component of a comprehensive MRSA colonization surveillance program. It is not intended to diagnose MRSA infection nor to guide or monitor treatment for MRSA infections.     Coagulation Studies: No results for input(s): LABPROT, INR in the last 72 hours.  Urinalysis: No results for input(s): COLORURINE, LABSPEC, PHURINE, GLUCOSEU, HGBUR, BILIRUBINUR, KETONESUR, PROTEINUR, UROBILINOGEN, NITRITE, LEUKOCYTESUR in the last 72 hours.  Invalid input(s): APPERANCEUR    Imaging: No results found.   Medications:     . calcium-vitamin D  1 tablet Oral Q breakfast  . cefTAZidime (FORTAZ)  IV  2 g Intravenous Q T,Th,Sa-HD  . epoetin (EPOGEN/PROCRIT) injection  10,000 Units Intravenous Q T,Th,Sa-HD  . feeding supplement (ENSURE ENLIVE)  237 mL Oral TID WC  . feeding supplement (PRO-STAT SUGAR FREE 64)  30 mL Oral BID BM  . heparin  5,000 Units Subcutaneous Q8H  .  insulin  aspart  0-15 Units Subcutaneous TID WC  . insulin aspart  0-5 Units Subcutaneous QHS  . levothyroxine  100 mcg Oral QHS  . liothyronine  10 mcg Oral Daily  . metroNIDAZOLE  500 mg Oral Q8H  . omega-3 acid ethyl esters  1 g Oral Daily  . pantoprazole  40 mg Oral BID  . pregabalin  75 mg Oral BID  . rosuvastatin  10 mg Oral QHS  . vancomycin  1,000 mg Intravenous Q T,Th,Sa-HD   sodium chloride, sodium chloride, acetaminophen **OR** acetaminophen, albuterol, alteplase, heparin, lidocaine (PF), lidocaine-prilocaine, midodrine, morphine injection, ondansetron **OR** ondansetron (ZOFRAN) IV, pentafluoroprop-tetrafluoroeth, traMADol  Assessment/ Plan:  45 y.o. 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 TTS  1. End-stage renal disease on hemodialysisTuesday, Thursday, Saturday.   - Continue TTS schedule.   2. Sepsis.  Both Escherichia coli and enterococcusin blood culture. Suprapubic catheter changed 04/05/16. Wound and/or urine as source - Appreciate ID, urology, Surgery and wound care input.  I&D 6/2 Dr. Excell Seltzer.   - gentamicin irrigation of wounds   3. Anemia of chronic kidney disease. Scheduled for PRBC transfusion 5/30 with hemodialysis treatment.  - epo with HD treatment  4. Secondary hyperparathyroidism. PTH 85 and phos 1.3 outpatient - discontinued cinacalcet.    LOS: 6 Albie Arizpe 6/2/20175:56 PM

## 2016-04-10 NOTE — Anesthesia Procedure Notes (Signed)
Procedure Name: Intubation Date/Time: 04/10/2016 8:06 AM Performed by: Almeta Monas Pre-anesthesia Checklist: Patient identified, Emergency Drugs available, Suction available and Patient being monitored Patient Re-evaluated:Patient Re-evaluated prior to inductionOxygen Delivery Method: Circle system utilized Preoxygenation: Pre-oxygenation with 100% oxygen Intubation Type: IV induction Ventilation: Mask ventilation without difficulty Grade View: Grade I Tube type: Oral Tube size: 7.0 mm Number of attempts: 1 Airway Equipment and Method: Patient positioned with wedge pillow and Stylet Placement Confirmation: ETT inserted through vocal cords under direct vision,  positive ETCO2 and breath sounds checked- equal and bilateral Secured at: 21 cm Tube secured with: Tape Dental Injury: Teeth and Oropharynx as per pre-operative assessment

## 2016-04-11 LAB — CBC
HEMATOCRIT: 24.8 % — AB (ref 40.0–52.0)
HEMOGLOBIN: 7.5 g/dL — AB (ref 13.0–18.0)
MCH: 26.3 pg (ref 26.0–34.0)
MCHC: 30.2 g/dL — AB (ref 32.0–36.0)
MCV: 87.1 fL (ref 80.0–100.0)
Platelets: 83 10*3/uL — ABNORMAL LOW (ref 150–440)
RBC: 2.85 MIL/uL — ABNORMAL LOW (ref 4.40–5.90)
RDW: 18.3 % — ABNORMAL HIGH (ref 11.5–14.5)
WBC: 17.6 10*3/uL — ABNORMAL HIGH (ref 3.8–10.6)

## 2016-04-11 LAB — COMPREHENSIVE METABOLIC PANEL
ALK PHOS: 419 U/L — AB (ref 38–126)
ALT: 6 U/L — AB (ref 17–63)
ANION GAP: 12 (ref 5–15)
AST: 14 U/L — ABNORMAL LOW (ref 15–41)
Albumin: 1.3 g/dL — ABNORMAL LOW (ref 3.5–5.0)
BILIRUBIN TOTAL: 1.3 mg/dL — AB (ref 0.3–1.2)
BUN: 31 mg/dL — ABNORMAL HIGH (ref 6–20)
CALCIUM: 7.6 mg/dL — AB (ref 8.9–10.3)
CO2: 23 mmol/L (ref 22–32)
CREATININE: 3.09 mg/dL — AB (ref 0.61–1.24)
Chloride: 95 mmol/L — ABNORMAL LOW (ref 101–111)
GFR, EST AFRICAN AMERICAN: 26 mL/min — AB (ref >60)
GFR, EST NON AFRICAN AMERICAN: 22 mL/min — AB (ref >60)
Glucose, Bld: 308 mg/dL — ABNORMAL HIGH (ref 65–99)
Potassium: 4.1 mmol/L (ref 3.5–5.1)
Sodium: 130 mmol/L — ABNORMAL LOW (ref 135–145)
TOTAL PROTEIN: 5.4 g/dL — AB (ref 6.5–8.1)

## 2016-04-11 LAB — GLUCOSE, CAPILLARY
GLUCOSE-CAPILLARY: 178 mg/dL — AB (ref 65–99)
Glucose-Capillary: 285 mg/dL — ABNORMAL HIGH (ref 65–99)

## 2016-04-11 LAB — PHOSPHORUS: Phosphorus: 1.9 mg/dL — ABNORMAL LOW (ref 2.5–4.6)

## 2016-04-11 MED ORDER — VANCOMYCIN HCL IN DEXTROSE 1-5 GM/200ML-% IV SOLN: 1000.0000 mg | INTRAVENOUS | Status: AC

## 2016-04-11 MED ORDER — TRAMADOL HCL 50 MG PO TABS: 50.0000 mg | ORAL_TABLET | Freq: Three times a day (TID) | ORAL | Status: AC | PRN

## 2016-04-11 MED ORDER — TEMAZEPAM 7.5 MG PO CAPS: 7.5000 mg | ORAL_CAPSULE | Freq: Every evening | ORAL | Status: AC | PRN

## 2016-04-11 MED ORDER — DEXTROSE 5 % IV SOLN: 2.0000 g | INTRAVENOUS | Status: AC

## 2016-04-11 MED ORDER — METRONIDAZOLE 500 MG PO TABS: 500.0000 mg | ORAL_TABLET | Freq: Three times a day (TID) | ORAL | Status: AC

## 2016-04-11 MED ORDER — ENSURE ENLIVE PO LIQD
237.0000 mL | Freq: Three times a day (TID) | ORAL | Status: AC
Start: 2016-04-11 — End: ?

## 2016-04-11 NOTE — Progress Notes (Signed)
Transport here to pick up patient. Dressing changed on sacral wound. IV out and Supra pubic catheter dressing changed and intact. Patient stable and time of discharge

## 2016-04-11 NOTE — Discharge Instructions (Addendum)
°  DIET:  Diabetic diet and Renal diet  DISCHARGE CONDITION:  Stable  ACTIVITY:  Activity as tolerated  OXYGEN:  Home Oxygen: Yes.     Oxygen Delivery: 3 liters/min via Patient connected to nasal cannula oxygen  DISCHARGE LOCATION:  nursing home   If you experience worsening of your admission symptoms, develop shortness of breath, life threatening emergency, suicidal or homicidal thoughts you must seek medical attention immediately by calling 911 or calling your MD immediately  if symptoms less severe.  You Must read complete instructions/literature along with all the possible adverse reactions/side effects for all the Medicines you take and that have been prescribed to you. Take any new Medicines after you have completely understood and accpet all the possible adverse reactions/side effects.   Please note  You were cared for by a hospitalist during your hospital stay. If you have any questions about your discharge medications or the care you received while you were in the hospital after you are discharged, you can call the unit and asked to speak with the hospitalist on call if the hospitalist that took care of you is not available. Once you are discharged, your primary care physician will handle any further medical issues. Please note that NO REFILLS for any discharge medications will be authorized once you are discharged, as it is imperative that you return to your primary care physician (or establish a relationship with a primary care physician if you do not have one) for your aftercare needs so that they can reassess your need for medications and monitor your lab values.   -IV antibiotics with Ceftazidime, Vancomycin with HD Tuesday, Thursday, Saturday until 05/22/2016.  -Oral Flagyl till 05/22/2016.  -Silvadene dressing was placed with ABG pads daily

## 2016-04-11 NOTE — Discharge Summary (Signed)
[facility] - [place] [facility]   PATIENT NAME: Taylor [name]    MR#:  [id number]  DATE OF BIRTH:  Feb 05, 1971  DATE OF ADMISSION:  04/04/2016 ADMITTING PHYSICIAN: Enedina Finner, MD  DATE OF DISCHARGE: No discharge date for patient encounter.  PRIMARY CARE PHYSICIAN: [name], MD   ADMISSION DIAGNOSIS:  Sepsis, due to unspecified organism (HCC) [A41.9]  DISCHARGE DIAGNOSIS:  Active Problems:   Pressure ulcer   Sepsis (HCC)   Malnutrition of moderate degree   Decubital ulcer   SECONDARY DIAGNOSIS:   Past Medical History  Diagnosis Date  . End stage renal disease on dialysis (HCC)     LUE fistula  . Type I diabetes mellitus (HCC)     a. 03/2014 admitted with HNK to [facility]. b. TTS  . Diabetic neuropathy (HCC)     severe, s/p multiple toe amputation  . Hypothyroidism   . Hypertension   . Hyperlipidemia   . H/O hiatal hernia   . GERD (gastroesophageal reflux disease)   . Anxiety   . Sebaceous cyst     side of neck  . Pneumonia     2010  . Anemia     a. req PRBC's 2011.  [name] PAD (peripheral artery disease) (HCC)     a. s/p amputation of toes on the right;  b. left LE claudication.  . Coronary artery disease     a. s/p MI;  b. 10/2009 CABG x 3 @ [facility]: LIMA->LAD, VG->OM3, VG->RPDA; c. 11/2010 Cath 3/3 patent grafts;  d 12/2012 Cath: LM 30d, LAD 85p, D1 70, D2 90, LCX 40ost, OM2 100, RCA 90p, 15m, L->LAD ok, VG->OM3 ok, VG->RPDA 30, EF 50%->Med Rx.  . Cataract     right  . Valvular disease     a. 11/2012 Echo: EF 55-60%, mild LVH, mild MR, mild bi-atrial enlargement, mild-mod TR, PASP ; b. echo 03/2014: EF 50-55%, nl WM, select images concerning for bicuspid aortic valve w/ nl thicknes of leaflets, mild MR, mild bi-atrial dilatation, RV mild dilatation - wall thickness nl, mod TR - select images appears to be mod to sev, PASP at least mod elevated.  . Diastolic CHF (HCC)     see echo above  . Depression   . Arthritis     rheumatoid  arthritis   . Myocardial infarction (HCC) 2010  . Pulmonary HTN (HCC)     a. continuation of 03/2014 echo PASP @ least mod elevated. RVSP 52 mm Hg. Parasternal long axis estimated @ 85 mm Hg  . Scrotal abscess     a. s/p multiple I&D  . Penile abscess     a. s/p multiple I&D  . Hematemesis     a. EGD 2016 with LA Grade C esophagitis, continued on Protonix   . Chronic respiratory failure (HCC)     a. on 3L oxygen via nasal cannula  . Anxiety   . Acute delirium   . Sepsis (HCC)   . 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 MYOCARDIAL INFARCTION, HX OF 01/26/2011    Qualifier: Diagnosis of  By: [name] MD, [name]    . HTN (hypertension) 02/11/2011  . Pulmonary hypertension associated with end stage renal disease on dialysis (HCC) 04/16/2014  . Hyperkalemia   . Hypothyroidism   . Osteomyelitis [facility])      ADMITTING HISTORY  Taylor [name] is a 45 y.o. male with a known history ofEnd-stage renal disease on hemodialysis, diabetes with severe diabetic neuropathy and multiple toe amputation,  severe spinal stenosis with chronic back pain, hypertension, hypothyroidism, comes [facility] after he was found to have altered mental status and urine looking very thick and turbinate. Patient has a suprapubic catheter for a long time and was recently admitted discharge in March 2017 with a urine grew Candida. Patient was discharged to finish a course of Diflucan.  His white count is 27,000. Patient is being admitted with sepsis source appears to be urine. Received IV Vanco and Zosyn in the emergency room in the office   HOSPITAL COURSE:   * Escherichia coli and enterococcus bacteremia and UTI with sepsis Changed to vancomycin and Ceftzidime. Discussed with Dr. Sampson Goon. Seen by urology and started on gentamicin irrigation of the bladder. Echocardiogram showed no endocarditis. WBC count improving. Patient will need IV antibiotics with Ceftin has edema and vancomycin at  dialysis till 05/22/2016.  * C. Difficile infection On PO Fagyl  * ESRD: HD per nephrology. Getting hemodialysis per nephrology.  * Anemia of chronic disease S/p PRBC 04/07/2016. Stable at 7.5  * Sacral Decubitis Ulcer: Present on admission. Wound care has placed on air mattres. S/p debridement  Silvadene dressing daily Follow-up with Dr. Excell Seltzer at the surgical clinic  * Severe Spinal Stenosis: Appears comfortable.  * Suprapubic cath Cath changed by urology. Treated with gentamicin flushes.  Patient is stable for discharge.  His overall prognosis is guarded with his worsening comorbidities.  High risk for readmission.  CONSULTS OBTAINED:  Treatment Team:  Mady Haagensen, MD Leafy Ro, MD Mick Sell, MD  DRUG ALLERGIES:   Allergies  Allergen Reactions  . Amoxicillin-Pot Clavulanate Nausea And Vomiting and Itching  . Contrast Media [Iodinated Diagnostic Agents] Other (See Comments)    Unknown reaction  . 2nd Skin Quick Heal Other (See Comments)  . Rifampin Nausea And Vomiting  . Tape Other (See Comments)    DISCHARGE MEDICATIONS:   Current Discharge Medication List    START taking these medications   Details  cefTAZidime 2 g in dextrose 5 % 50 mL Inject 2 g into the vein Every Tuesday,Thursday,and Saturday with dialysis.    feeding supplement, ENSURE ENLIVE, (ENSURE ENLIVE) LIQD Take 237 mLs by mouth 3 (three) times daily with meals. Qty: 237 mL, Refills: 12    metroNIDAZOLE (FLAGYL) 500 MG tablet Take 1 tablet (500 mg total) by mouth 3 (three) times daily. Qty: 120 tablet, Refills: 0    vancomycin (VANCOCIN) 1-5 GM/200ML-% SOLN Inject 200 mLs (1,000 mg total) into the vein Every Tuesday,Thursday,and Saturday with dialysis. Qty: 4000 mL      CONTINUE these medications which have NOT CHANGED   Details  acetaminophen (TYLENOL) 325 MG tablet Take 2 tablets (650 mg total) by mouth every 6 (six) hours as needed for mild pain or headache. Qty: 30  tablet, Refills: 2    alum & mag hydroxide-simeth (MAALOX PLUS) 400-400-40 MG/5ML suspension Take 30 mLs by mouth every 4 (four) hours as needed for indigestion.     aspirin EC 81 MG tablet Take 81 mg by mouth daily.    calcium-vitamin D (OSCAL WITH D) 250-125 MG-UNIT tablet Take 1 tablet by mouth daily.    chlorhexidine (PERIDEX) 0.12 % solution 15 mLs by Mouth Rinse route 2 (two) times daily. Qty: 120 mL, Refills: 0    cinacalcet (SENSIPAR) 30 MG tablet Take 30 mg by mouth daily. Take with biggest meal.    clopidogrel (PLAVIX) 75 MG tablet Take 1 tablet (75 mg total) by  mouth daily. Qty: 90 tablet, Refills: 3    DHA-EPA-VITAMIN E PO Take 1 capsule by mouth daily.    insulin aspart (NOVOLOG) 100 UNIT/ML injection Inject 0-6 Units into the skin 4 (four) times daily -  before meals and at bedtime. Per sliding scale: blood sugar 201-250 give 2 units, blood sugar 251-300 give 3 units, blood sugar 301-350 give 4 units, blood sugar 351-400 give 5 units, blood sugar 401-450 give 6 units. Qty: 10 mL, Refills: 11    insulin detemir (LEVEMIR) 100 UNIT/ML injection Inject 0.07 mLs (7 Units total) into the skin at bedtime. Qty: 10 mL, Refills: 11    Lactobacillus (ACIDOPHILUS) CAPS capsule Take 1 capsule by mouth daily.    levothyroxine (SYNTHROID, LEVOTHROID) 100 MCG tablet Take 100 mcg by mouth daily before breakfast.    liothyronine (CYTOMEL) 5 MCG tablet Take 10 mcg by mouth daily.    midodrine (PROAMATINE) 10 MG tablet Take 1 tablet (10 mg total) by mouth daily as needed (Administer prior to dialysis). Qty: 30 tablet, Refills: 0    nitroGLYCERIN (NITROSTAT) 0.4 MG SL tablet Place 0.4 mg under the tongue every 5 (five) minutes as needed for chest pain.    Omega-3 Fatty Acids (FISH OIL) 1000 MG CAPS Take 1 capsule by mouth daily.    pantoprazole (PROTONIX) 40 MG tablet Take 40 mg by mouth 2 (two) times daily.    pregabalin (LYRICA) 75 MG capsule Take 75 mg by mouth 2 (two) times  daily.     PROAIR HFA 108 (90 Base) MCG/ACT inhaler Inhale 2 puffs into the lungs every 6 (six) hours as needed for shortness of breath.  Refills: 1    rosuvastatin (CRESTOR) 10 MG tablet Take 10 mg by mouth at bedtime.    senna-docusate (SENOKOT-S) 8.6-50 MG tablet Take 1 tablet by mouth at bedtime as needed for mild constipation. Qty: 30 tablet, Refills: 0    sevelamer carbonate (RENVELA) 800 MG tablet Take 800 mg by mouth 3 (three) times daily with meals.     temazepam (RESTORIL) 7.5 MG capsule Take 1 capsule (7.5 mg total) by mouth at bedtime as needed for sleep. Qty: 30 capsule, Refills: 0    traMADol (ULTRAM) 50 MG tablet Take 1 tablet (50 mg total) by mouth every 8 (eight) hours as needed. Qty: 30 tablet, Refills: 0        Today   VITAL SIGNS:  Blood pressure 103/57, pulse 107, temperature 98.3 F (36.8 C), temperature source Oral, resp. rate 20, height 5\' 10"  (1.778 m), weight 83.008 kg (183 lb), SpO2 100 %.  I/O:   Intake/Output Summary (Last 24 hours) at 04/11/16 1106 Last data filed at 04/11/16 0919  Gross per 24 hour  Intake    240 ml  Output    120 ml  Net    120 ml    PHYSICAL EXAMINATION:  Physical Exam  GENERAL:  45 y.o.-year-old patient lying in the bed with no acute distress.  LUNGS: Normal breath sounds bilaterally, no wheezing, rales,rhonchi or crepitation. No use of accessory muscles of respiration.  CARDIOVASCULAR: S1, S2 normal. No murmurs, rubs, or gallops.  ABDOMEN: Soft, non-tender, non-distended. Bowel sounds present. No organomegaly or mass.  PSYCHIATRIC: The patient is alert and awake SKIN: large sacral ulcer under sressing  DATA REVIEW:   CBC  Recent Labs Lab 04/11/16 0922  WBC 17.6*  HGB 7.5*  HCT 24.8*  PLT 83*    Chemistries   Recent Labs Lab 04/11/16 0922  NA  130*  K 4.1  CL 95*  CO2 23  GLUCOSE 308*  BUN 31*  CREATININE 3.09*  CALCIUM 7.6*  AST 14*  ALT 6*  ALKPHOS 419*  BILITOT 1.3*    Cardiac  Enzymes  Recent Labs Lab 04/04/16 1719  TROPONINI 0.07*    Microbiology Results  Results for orders placed or performed during the hospital encounter of 04/04/16  Urine culture     Status: Abnormal   Collection Time: 04/04/16  5:16 PM  Result Value Ref Range Status   Specimen Description URINE, RANDOM  Final   Special Requests NONE  Final   Culture MULTIPLE SPECIES PRESENT, SUGGEST RECOLLECTION (A)  Final   Report Status 04/06/2016 FINAL  Final  Blood Culture (routine x 2)     Status: Abnormal   Collection Time: 04/04/16  5:19 PM  Result Value Ref Range Status   Specimen Description BLOOD RIGHT HAND  Final   Special Requests BOTTLES DRAWN AEROBIC AND ANAEROBIC  6CC  Final   Culture  Setup Time   Final    GRAM NEGATIVE RODS GRAM POSITIVE COCCI IN PAIRS IN CHAINS IN BOTH AEROBIC AND ANAEROBIC BOTTLES CRITICAL RESULT CALLED TO, READ BACK BY AND VERIFIED WITH: ALLISON THARAKAN AT 0825 ON 04/05/16. CTJ    Culture (A)  Final    ESCHERICHIA COLI ENTEROCOCCUS FAECALIS IN BOTH AEROBIC AND ANAEROBIC BOTTLES    Report Status 04/09/2016 FINAL  Final   Organism ID, Bacteria ESCHERICHIA COLI  Final   Organism ID, Bacteria ENTEROCOCCUS FAECALIS  Final      Susceptibility   Escherichia coli - MIC*    AMPICILLIN >=32 RESISTANT Resistant     CEFAZOLIN <=4 SENSITIVE Sensitive     CEFEPIME <=1 SENSITIVE Sensitive     CEFTAZIDIME <=1 SENSITIVE Sensitive     CEFTRIAXONE <=1 SENSITIVE Sensitive     CIPROFLOXACIN >=4 RESISTANT Resistant     GENTAMICIN <=1 SENSITIVE Sensitive     IMIPENEM <=0.25 SENSITIVE Sensitive     TRIMETH/SULFA <=20 SENSITIVE Sensitive     AMPICILLIN/SULBACTAM 4 SENSITIVE Sensitive     PIP/TAZO <=4 SENSITIVE Sensitive     Extended ESBL NEGATIVE Sensitive     * ESCHERICHIA COLI   Enterococcus faecalis - MIC*    AMPICILLIN <=2 SENSITIVE Sensitive     VANCOMYCIN 1 SENSITIVE Sensitive     GENTAMICIN SYNERGY RESISTANT Resistant     LINEZOLID 2 SENSITIVE Sensitive     *  ENTEROCOCCUS FAECALIS  Blood Culture (routine x 2)     Status: Abnormal   Collection Time: 04/04/16  5:19 PM  Result Value Ref Range Status   Specimen Description BLOOD RIGHT FOREARM  Final   Special Requests BOTTLES DRAWN AEROBIC AND ANAEROBIC  5CC  Final   Culture  Setup Time   Final    GRAM NEGATIVE RODS AEROBIC BOTTLE ONLY CRITICAL RESULT CALLED TO, READ BACK BY AND VERIFIED WITH: ALLISON THARAKAN AT 0825 ON 04/05/16. CTJ GRAM POSITIVE COCCI GRAM POSITIVE RODS ANAEROBIC BOTTLE ONLY CRITICAL RESULT CALLED TO, READ BACK BY AND VERIFIED WITH: ALLISON THARAKAN ON 04/05/16 AT 1130 BY QSD    Culture (A)  Final    ESCHERICHIA COLI ENTEROCOCCUS FAECALIS GRAM POSITIVE RODS Results consistent with contamination.    Report Status 04/10/2016 FINAL  Final   Organism ID, Bacteria ESCHERICHIA COLI  Final   Organism ID, Bacteria ENTEROCOCCUS FAECALIS  Final      Susceptibility   Escherichia coli - MIC*    AMPICILLIN >=32 RESISTANT Resistant  CEFAZOLIN <=4 SENSITIVE Sensitive     CEFEPIME <=1 SENSITIVE Sensitive     CEFTAZIDIME <=1 SENSITIVE Sensitive     CEFTRIAXONE <=1 SENSITIVE Sensitive     CIPROFLOXACIN >=4 RESISTANT Resistant     GENTAMICIN <=1 SENSITIVE Sensitive     IMIPENEM <=0.25 SENSITIVE Sensitive     TRIMETH/SULFA <=20 SENSITIVE Sensitive     AMPICILLIN/SULBACTAM 4 SENSITIVE Sensitive     PIP/TAZO <=4 SENSITIVE Sensitive     Extended ESBL NEGATIVE Sensitive     * ESCHERICHIA COLI   Enterococcus faecalis - MIC*    AMPICILLIN <=2 RESISTANT Resistant     VANCOMYCIN 1 SENSITIVE Sensitive     GENTAMICIN SYNERGY RESISTANT Resistant     LINEZOLID 2 SENSITIVE Sensitive     * ENTEROCOCCUS FAECALIS  Blood Culture ID Panel (Reflexed)     Status: Abnormal   Collection Time: 04/04/16  5:19 PM  Result Value Ref Range Status   Enterococcus species DETECTED (A) NOT DETECTED Final    Comment: CRITICAL RESULT CALLED TO, READ BACK BY AND VERIFIED WITH: ALLISON THARAKAN FOR ENTEROCOCCUS,  ENTEROBACTERIACEAE AND E. COLI AT 0825 ON 04/05/16. CTJ    Vancomycin resistance NOT DETECTED NOT DETECTED Final   Listeria monocytogenes NOT DETECTED NOT DETECTED Final   Staphylococcus species NOT DETECTED NOT DETECTED Final   Staphylococcus aureus NOT DETECTED NOT DETECTED Final   Methicillin resistance NOT DETECTED NOT DETECTED Final   Streptococcus species NOT DETECTED NOT DETECTED Final   Streptococcus agalactiae NOT DETECTED NOT DETECTED Final   Streptococcus pneumoniae NOT DETECTED NOT DETECTED Final   Streptococcus pyogenes NOT DETECTED NOT DETECTED Final   Acinetobacter baumannii NOT DETECTED NOT DETECTED Final   Enterobacteriaceae species DETECTED (A) NOT DETECTED Final   Enterobacter cloacae complex NOT DETECTED NOT DETECTED Final   Escherichia coli DETECTED (A) NOT DETECTED Final   Klebsiella oxytoca NOT DETECTED NOT DETECTED Final   Klebsiella pneumoniae NOT DETECTED NOT DETECTED Final   Proteus species NOT DETECTED NOT DETECTED Final   Serratia marcescens NOT DETECTED NOT DETECTED Final   Carbapenem resistance NOT DETECTED NOT DETECTED Final   Haemophilus influenzae NOT DETECTED NOT DETECTED Final   Neisseria meningitidis NOT DETECTED NOT DETECTED Final   Pseudomonas aeruginosa NOT DETECTED NOT DETECTED Final   Candida albicans NOT DETECTED NOT DETECTED Final   Candida glabrata NOT DETECTED NOT DETECTED Final   Candida krusei NOT DETECTED NOT DETECTED Final   Candida parapsilosis NOT DETECTED NOT DETECTED Final   Candida tropicalis NOT DETECTED NOT DETECTED Final  C difficile quick scan w PCR reflex     Status: Abnormal   Collection Time: 04/05/16 11:57 AM  Result Value Ref Range Status   C Diff antigen POSITIVE (A) NEGATIVE Final   C Diff toxin NEGATIVE NEGATIVE Final   C Diff interpretation   Final    Positive for toxigenic C. difficile, active toxin production not detected. Patient has toxigenic C. difficile organisms present in the bowel, but toxin was not  detected. The patient may be a carrier or the level of toxin in the sample was below the limit  of detection. This information should be used in conjunction with the patient's clinical history when deciding on possible therapy.     Comment: CRITICAL RESULT CALLED TO, READ BACK BY AND VERIFIED WITH: TAYLOR BECK @ 1410 04/05/16 BY T[facility]   Clostridium Difficile by PCR     Status: Abnormal   Collection Time: 04/05/16 11:57 AM  Result Value Ref Range Status  Toxigenic C Difficile by pcr POSITIVE (A) NEGATIVE Final    Comment: CRITICAL RESULT CALLED TO, READ BACK BY AND VERIFIED WITH: TAYLOR BECK @ 1410 04/05/16 BY TCH   MRSA PCR Screening     Status: None   Collection Time: 04/05/16 10:20 PM  Result Value Ref Range Status   MRSA by PCR NEGATIVE NEGATIVE Final    Comment:        The GeneXpert MRSA Assay (FDA approved for NASAL specimens only), is one component of a comprehensive MRSA colonization surveillance program. It is not intended to diagnose MRSA infection nor to guide or monitor treatment for MRSA infections.     RADIOLOGY:  No results found.  Follow up with PCP in 1 week.  Management plans discussed with the patient, family and they are in agreement.  CODE STATUS:     Code Status Orders        Start     Ordered   04/04/16 1907  Do not attempt resuscitation (DNR)   Continuous    Question Answer Comment  In the event of cardiac or respiratory ARREST Do not call a "code blue"   In the event of cardiac or respiratory ARREST Do not perform Intubation, CPR, defibrillation or ACLS   In the event of cardiac or respiratory ARREST Use medication by any route, position, wound care, and other measures to relive pain and suffering. May use oxygen, suction and manual treatment of airway obstruction as needed for comfort.      04/04/16 1906    Code Status History    Date Active Date Inactive Code Status Order ID Comments User Context   01/23/2016 12:35 PM 02/03/2016  6:21 PM DNR  [id number]  Adrian Saran, MD Inpatient   01/07/2016 11:59 AM 01/17/2016 12:45 AM DNR [id number]  Enedina Finner, MD Inpatient   10/10/2015  3:27 PM 10/17/2015  8:50 PM Full Code [id number]  Katha Hamming, MD ED   05/27/2015  9:57 AM 05/27/2015  4:14 PM Full Code [id number]  [name], MD Inpatient   03/13/2015 11:35 PM 03/19/2015 10:13 PM Partial Code [id number]  Ramonita Lab, MD Inpatient   03/09/2015 11:45 PM 03/12/2015  4:43 PM Full Code [id number]  Areta Haber, RN Inpatient   02/02/2015  5:04 AM 02/11/2015 11:21 PM Full Code [id number]  Rhetta Mura, MD ED   12/10/2014  6:31 PM 12/21/2014  5:57 PM Full Code [id number]  Meredeth Ide, MD Inpatient   08/23/2014  2:13 AM 08/27/2014  4:30 PM Full Code [id number]  Yevonne Pax, MD Inpatient   07/24/2014  1:25 PM 07/25/2014  1:50 PM Full Code [id number]  [name], MD Inpatient   01/04/2012  7:05 PM 01/10/2012  1:20 PM DNR [id number]  Elease Etienne, MD ED    Advance Directive Documentation        Most Recent Value   Type of Advance Directive  Out of facility DNR (pink MOST or yellow form)   Pre-existing out of facility DNR order (yellow form or pink MOST form)  Yellow form placed in chart (order not valid for inpatient use)   "MOST" Form in Place?        TOTAL TIME TAKING CARE OF THIS PATIENT ON DAY OF DISCHARGE: more than 30 minutes.   Milagros Loll R M.D on 04/11/2016 at 11:06 AM  Between 7am to 6pm - Pager - [phone]  After 6pm go to [email or web address] - password [facility]  Office  [phone]  CC: Primary care physician; [name], MD  Note: This dictation was prepared with Dragon dictation along with smaller phrase technology. Any transcriptional errors that result from this process are unintentional.

## 2016-04-11 NOTE — Progress Notes (Signed)
Pre Dialysis 

## 2016-04-11 NOTE — Progress Notes (Signed)
Dialysis started 

## 2016-04-11 NOTE — Progress Notes (Signed)
A&O. Bed bound. Medicated for pain. Wounds dressed once during the night, will dress again before shift change. Wounds large with copious serosanguinous drainage. There is an order for flexiseal rectal tube however it came out during the night and patient asked that it remain out.

## 2016-04-11 NOTE — Progress Notes (Signed)
Report called to [facility] hall 600 nurse. Currently in dialysis, will change dressing on bottom before transport. Will call transport as soon as patient is ready. Will continue to monitor

## 2016-04-11 NOTE — Progress Notes (Signed)
[facility]  ROUNDING NOTE   Subjective:   Brother at bedside.  Post op day 1 from sacral decub I&D.  Hemodialysis for later today.   Objective:  Vital signs in last 24 hours:  Temp:  [97.4 F (36.3 C)-98.2 F (36.8 C)] 98.2 F (36.8 C) (06/03 0421) Pulse Rate:  [99-110] 110 (06/03 0801) Resp:  [18-20] 18 (06/03 0421) BP: (111-129)/(56-63) 129/63 mmHg (06/03 0801) SpO2:  [100 %] 100 % (06/03 0801)  Weight change:  Filed Weights   04/04/16 2142 04/07/16 1325 04/10/16 0723  Weight: 83.099 kg (183 lb 3.2 oz) 83.1 kg (183 lb 3.2 oz) 83.008 kg (183 lb)    Intake/Output: I/O last 3 completed shifts: In: 550 [I.V.:550] Out: 580 [Urine:570; Blood:10]   Intake/Output this shift:  Total I/O In: 240 [P.O.:240] Out: -   Physical Exam: General: Laying in bed  Head: Normocephalic, atraumatic. Moist oral mucosal membranes  Eyes: Anicteric  Neck: Supple, trachea midline  Lungs:  Clear to auscultation normal effort  Heart: S1S2 no rubs  Abdomen:  Soft, nontender, BS present   Extremities: 2+ peripheral edema.  Neurologic: Alert  Skin: Sacral decubitus - clean dressings  Access: LUE AVF    Basic Metabolic Panel:  Recent Labs Lab 04/04/16 1719 04/05/16 0712  NA 134* 130*  K 3.2* 3.4*  CL 93* 93*  CO2 30 25  GLUCOSE 195* 268*  BUN 15 18  CREATININE 1.93* 2.18*  CALCIUM 7.8* 7.4*    Liver Function Tests:  Recent Labs Lab 04/04/16 1719  AST 16  ALT 8*  ALKPHOS 431*  BILITOT 1.4*  PROT 5.6*  ALBUMIN 1.5*   No results for input(s): LIPASE, AMYLASE in the last 168 hours. No results for input(s): AMMONIA in the last 168 hours.  CBC:  Recent Labs Lab 04/04/16 1719 04/05/16 0712 04/07/16 0447 04/08/16 0436 04/11/16 0922  WBC 27.8* 22.9* 20.5* 19.6* 17.6*  NEUTROABS 26.9*  --   --  18.3*  --   HGB 7.6* 7.0* 6.9* 8.1* 7.5*  HCT 25.0* 23.2* 22.4* 26.3* 24.8*  MCV 88.6 88.8 88.7 87.8 87.1  PLT 163 127* 107* 98* 83*    Cardiac  Enzymes:  Recent Labs Lab 04/04/16 1719  TROPONINI 0.07*    BNP: Invalid input(s): POCBNP  CBG:  Recent Labs Lab 04/10/16 0912 04/10/16 1115 04/10/16 1650 04/10/16 2051 04/11/16 0739  GLUCAP 111* 144* 193* 221* 285*    Microbiology: Results for orders placed or performed during the hospital encounter of 04/04/16  Urine culture     Status: Abnormal   Collection Time: 04/04/16  5:16 PM  Result Value Ref Range Status   Specimen Description URINE, RANDOM  Final   Special Requests NONE  Final   Culture MULTIPLE SPECIES PRESENT, SUGGEST RECOLLECTION (A)  Final   Report Status 04/06/2016 FINAL  Final  Blood Culture (routine x 2)     Status: Abnormal   Collection Time: 04/04/16  5:19 PM  Result Value Ref Range Status   Specimen Description BLOOD RIGHT HAND  Final   Special Requests BOTTLES DRAWN AEROBIC AND ANAEROBIC  6CC  Final   Culture  Setup Time   Final    GRAM NEGATIVE RODS GRAM POSITIVE COCCI IN PAIRS IN CHAINS IN BOTH AEROBIC AND ANAEROBIC BOTTLES CRITICAL RESULT CALLED TO, READ BACK BY AND VERIFIED WITH: ALLISON THARAKAN AT 0825 ON 04/05/16. CTJ    Culture (A)  Final    ESCHERICHIA COLI ENTEROCOCCUS FAECALIS IN BOTH AEROBIC AND ANAEROBIC BOTTLES  Report Status 04/09/2016 FINAL  Final   Organism ID, Bacteria ESCHERICHIA COLI  Final   Organism ID, Bacteria ENTEROCOCCUS FAECALIS  Final      Susceptibility   Escherichia coli - MIC*    AMPICILLIN >=32 RESISTANT Resistant     CEFAZOLIN <=4 SENSITIVE Sensitive     CEFEPIME <=1 SENSITIVE Sensitive     CEFTAZIDIME <=1 SENSITIVE Sensitive     CEFTRIAXONE <=1 SENSITIVE Sensitive     CIPROFLOXACIN >=4 RESISTANT Resistant     GENTAMICIN <=1 SENSITIVE Sensitive     IMIPENEM <=0.25 SENSITIVE Sensitive     TRIMETH/SULFA <=20 SENSITIVE Sensitive     AMPICILLIN/SULBACTAM 4 SENSITIVE Sensitive     PIP/TAZO <=4 SENSITIVE Sensitive     Extended ESBL NEGATIVE Sensitive     * ESCHERICHIA COLI   Enterococcus faecalis - MIC*     AMPICILLIN <=2 SENSITIVE Sensitive     VANCOMYCIN 1 SENSITIVE Sensitive     GENTAMICIN SYNERGY RESISTANT Resistant     LINEZOLID 2 SENSITIVE Sensitive     * ENTEROCOCCUS FAECALIS  Blood Culture (routine x 2)     Status: Abnormal   Collection Time: 04/04/16  5:19 PM  Result Value Ref Range Status   Specimen Description BLOOD RIGHT FOREARM  Final   Special Requests BOTTLES DRAWN AEROBIC AND ANAEROBIC  5CC  Final   Culture  Setup Time   Final    GRAM NEGATIVE RODS AEROBIC BOTTLE ONLY CRITICAL RESULT CALLED TO, READ BACK BY AND VERIFIED WITH: ALLISON THARAKAN AT 0825 ON 04/05/16. CTJ GRAM POSITIVE COCCI GRAM POSITIVE RODS ANAEROBIC BOTTLE ONLY CRITICAL RESULT CALLED TO, READ BACK BY AND VERIFIED WITH: ALLISON THARAKAN ON 04/05/16 AT 1130 BY QSD    Culture (A)  Final    ESCHERICHIA COLI ENTEROCOCCUS FAECALIS GRAM POSITIVE RODS Results consistent with contamination.    Report Status 04/10/2016 FINAL  Final   Organism ID, Bacteria ESCHERICHIA COLI  Final   Organism ID, Bacteria ENTEROCOCCUS FAECALIS  Final      Susceptibility   Escherichia coli - MIC*    AMPICILLIN >=32 RESISTANT Resistant     CEFAZOLIN <=4 SENSITIVE Sensitive     CEFEPIME <=1 SENSITIVE Sensitive     CEFTAZIDIME <=1 SENSITIVE Sensitive     CEFTRIAXONE <=1 SENSITIVE Sensitive     CIPROFLOXACIN >=4 RESISTANT Resistant     GENTAMICIN <=1 SENSITIVE Sensitive     IMIPENEM <=0.25 SENSITIVE Sensitive     TRIMETH/SULFA <=20 SENSITIVE Sensitive     AMPICILLIN/SULBACTAM 4 SENSITIVE Sensitive     PIP/TAZO <=4 SENSITIVE Sensitive     Extended ESBL NEGATIVE Sensitive     * ESCHERICHIA COLI   Enterococcus faecalis - MIC*    AMPICILLIN <=2 RESISTANT Resistant     VANCOMYCIN 1 SENSITIVE Sensitive     GENTAMICIN SYNERGY RESISTANT Resistant     LINEZOLID 2 SENSITIVE Sensitive     * ENTEROCOCCUS FAECALIS  Blood Culture ID Panel (Reflexed)     Status: Abnormal   Collection Time: 04/04/16  5:19 PM  Result Value Ref Range Status    Enterococcus species DETECTED (A) NOT DETECTED Final    Comment: CRITICAL RESULT CALLED TO, READ BACK BY AND VERIFIED WITH: ALLISON THARAKAN FOR ENTEROCOCCUS, ENTEROBACTERIACEAE AND E. COLI AT 0825 ON 04/05/16. CTJ    Vancomycin resistance NOT DETECTED NOT DETECTED Final   Listeria monocytogenes NOT DETECTED NOT DETECTED Final   Staphylococcus species NOT DETECTED NOT DETECTED Final   Staphylococcus aureus NOT DETECTED NOT DETECTED Final  Methicillin resistance NOT DETECTED NOT DETECTED Final   Streptococcus species NOT DETECTED NOT DETECTED Final   Streptococcus agalactiae NOT DETECTED NOT DETECTED Final   Streptococcus pneumoniae NOT DETECTED NOT DETECTED Final   Streptococcus pyogenes NOT DETECTED NOT DETECTED Final   Acinetobacter baumannii NOT DETECTED NOT DETECTED Final   Enterobacteriaceae species DETECTED (A) NOT DETECTED Final   Enterobacter cloacae complex NOT DETECTED NOT DETECTED Final   Escherichia coli DETECTED (A) NOT DETECTED Final   Klebsiella oxytoca NOT DETECTED NOT DETECTED Final   Klebsiella pneumoniae NOT DETECTED NOT DETECTED Final   Proteus species NOT DETECTED NOT DETECTED Final   Serratia marcescens NOT DETECTED NOT DETECTED Final   Carbapenem resistance NOT DETECTED NOT DETECTED Final   Haemophilus influenzae NOT DETECTED NOT DETECTED Final   Neisseria meningitidis NOT DETECTED NOT DETECTED Final   Pseudomonas aeruginosa NOT DETECTED NOT DETECTED Final   Candida albicans NOT DETECTED NOT DETECTED Final   Candida glabrata NOT DETECTED NOT DETECTED Final   Candida krusei NOT DETECTED NOT DETECTED Final   Candida parapsilosis NOT DETECTED NOT DETECTED Final   Candida tropicalis NOT DETECTED NOT DETECTED Final  C difficile quick scan w PCR reflex     Status: Abnormal   Collection Time: 04/05/16 11:57 AM  Result Value Ref Range Status   C Diff antigen POSITIVE (A) NEGATIVE Final   C Diff toxin NEGATIVE NEGATIVE Final   C Diff interpretation   Final     Positive for toxigenic C. difficile, active toxin production not detected. Patient has toxigenic C. difficile organisms present in the bowel, but toxin was not detected. The patient may be a carrier or the level of toxin in the sample was below the limit  of detection. This information should be used in conjunction with the patient's clinical history when deciding on possible therapy.     Comment: CRITICAL RESULT CALLED TO, READ BACK BY AND VERIFIED WITH: TAYLOR BECK @ 1410 04/05/16 BY TCH   Clostridium Difficile by PCR     Status: Abnormal   Collection Time: 04/05/16 11:57 AM  Result Value Ref Range Status   Toxigenic C Difficile by pcr POSITIVE (A) NEGATIVE Final    Comment: CRITICAL RESULT CALLED TO, READ BACK BY AND VERIFIED WITH: TAYLOR BECK @ 1410 04/05/16 BY TCH   MRSA PCR Screening     Status: None   Collection Time: 04/05/16 10:20 PM  Result Value Ref Range Status   MRSA by PCR NEGATIVE NEGATIVE Final    Comment:        The GeneXpert MRSA Assay (FDA approved for NASAL specimens only), is one component of a comprehensive MRSA colonization surveillance program. It is not intended to diagnose MRSA infection nor to guide or monitor treatment for MRSA infections.     Coagulation Studies: No results for input(s): LABPROT, INR in the last 72 hours.  Urinalysis: No results for input(s): COLORURINE, LABSPEC, PHURINE, GLUCOSEU, HGBUR, BILIRUBINUR, KETONESUR, PROTEINUR, UROBILINOGEN, NITRITE, LEUKOCYTESUR in the last 72 hours.  Invalid input(s): APPERANCEUR    Imaging: No results found.   Medications:     . calcium-vitamin D  1 tablet Oral Q breakfast  . cefTAZidime (FORTAZ)  IV  2 g Intravenous Q T,Th,Sa-HD  . epoetin (EPOGEN/PROCRIT) injection  10,000 Units Intravenous Q T,Th,Sa-HD  . feeding supplement (ENSURE ENLIVE)  237 mL Oral TID WC  . feeding supplement (PRO-STAT SUGAR FREE 64)  30 mL Oral BID BM  . heparin  5,000 Units Subcutaneous Q8H  .  insulin aspart  0-15  Units Subcutaneous TID WC  . insulin aspart  0-5 Units Subcutaneous QHS  . levothyroxine  100 mcg Oral QHS  . liothyronine  10 mcg Oral Daily  . metroNIDAZOLE  500 mg Oral Q8H  . omega-3 acid ethyl esters  1 g Oral Daily  . pantoprazole  40 mg Oral BID  . pregabalin  75 mg Oral BID  . rosuvastatin  10 mg Oral QHS  . vancomycin  1,000 mg Intravenous Q T,Th,Sa-HD   sodium chloride, sodium chloride, acetaminophen **OR** acetaminophen, albuterol, alteplase, heparin, lidocaine (PF), lidocaine-prilocaine, midodrine, morphine injection, ondansetron **OR** ondansetron (ZOFRAN) IV, pentafluoroprop-tetrafluoroeth, traMADol  Assessment/ Plan:  45 y.o. 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 TTS  1. End-stage renal disease on hemodialysisTuesday, Thursday, Saturday.   - Continue TTS schedule. Labs today.   2. Sepsis.  Both Escherichia coli and enterococcusin blood culture. Suprapubic catheter changed 04/05/16. Wound and/or urine as source.  I&D 6/2 Dr. Excell Seltzer.   - Appreciate ID, urology, Surgery and wound care input.    3. Anemia of chronic kidney disease. PRBC transfusion 5/30   - epo with HD treatment - low threshold for another PRBC transfusion  4. Secondary hyperparathyroidism. PTH 85 and phos 1.3 outpatient - discontinued cinacalcet.    LOS: 7 Sherma Vanmetre 6/3/20179:53 AM

## 2016-04-11 NOTE — Clinical Social Work Note (Signed)
CSW received notice from Dr. Elpidio Anis, Dr. Ronn Melena and patient's nurse this morning that patient did not want to return to [facility]. Dr. Elpidio Anis spoke with patient regarding the fact that he is medically cleared for discharge. CSW spoke with patient and he asked that I call his brother, Jorja Loa, to let him know about the discharge. CSW contacted Tim via phone and he was very pleasant and understanding regarding the fact that patient would be a medicaid bed and [place] if placed at another facility and that would mean his dialysis center would change. CSW offered to send patient's referral out and then he could follow up and Tim agreed with this plan. Tim asked if an air mattress could be ordered for patient at [facility]. CSW spoke with Jomarie Longs at [facility] and requested an air mattress and Jomarie Longs stated that there would be no problem in getting one. CSW contacted Jorja Loa again to inform him and he verbalized appreciation. Discharge info sent to [facility] and nurse to call report. York Spaniel MSW,LCSW [phone]

## 2016-04-11 NOTE — Progress Notes (Signed)
Tx complete  

## 2016-04-11 NOTE — Progress Notes (Signed)
Post dialysis 

## 2016-04-12 LAB — HEPATITIS B SURFACE ANTIGEN: HEP B S AG: NEGATIVE

## 2016-04-12 LAB — HEPATITIS B SURFACE ANTIBODY, QUANTITATIVE: HEPATITIS B-POST: 13 m[IU]/mL

## 2016-04-13 ENCOUNTER — Telehealth: Payer: Self-pay | Admitting: *Deleted

## 2016-04-13 LAB — POCT I-STAT 4, (NA,K, GLUC, HGB,HCT)
Glucose, Bld: 107 mg/dL — ABNORMAL HIGH (ref 65–99)
HCT: 27 % — ABNORMAL LOW (ref 39.0–52.0)
HEMOGLOBIN: 9.2 g/dL — AB (ref 13.0–17.0)
Potassium: 3.8 mmol/L (ref 3.5–5.1)
SODIUM: 130 mmol/L — AB (ref 135–145)

## 2016-04-13 LAB — GLUCOSE, CAPILLARY: Glucose-Capillary: 109 mg/dL — ABNORMAL HIGH (ref 65–99)

## 2016-04-13 NOTE — Telephone Encounter (Signed)
Spoke to father.  Patient has been discharged back to [facility].

## 2016-04-13 NOTE — Telephone Encounter (Signed)
Transitional care call attempted.  Left message to return call. 

## 2016-04-16 ENCOUNTER — Other Ambulatory Visit
Admission: RE | Admit: 2016-04-16 | Discharge: 2016-04-16 | Disposition: A | Discharge status: Home / Self Care | Payer: Medicare Other | Source: Ambulatory Visit | Attending: Family Medicine | Admitting: Family Medicine

## 2016-04-16 DIAGNOSIS — E785 Hyperlipidemia, unspecified: Secondary | ICD-10-CM | POA: Insufficient documentation

## 2016-04-16 DIAGNOSIS — E039 Hypothyroidism, unspecified: Secondary | ICD-10-CM | POA: Diagnosis present

## 2016-04-16 DIAGNOSIS — N186 End stage renal disease: Secondary | ICD-10-CM | POA: Insufficient documentation

## 2016-04-16 LAB — CBC WITH DIFFERENTIAL/PLATELET
BASOS ABS: 0.2 10*3/uL — AB (ref 0–0.1)
Eosinophils Absolute: 0.1 10*3/uL (ref 0–0.7)
Eosinophils Relative: 1 %
HEMATOCRIT: 23.6 % — AB (ref 40.0–52.0)
HEMOGLOBIN: 7 g/dL — AB (ref 13.0–18.0)
Lymphocytes Relative: 4 %
Lymphs Abs: 0.7 10*3/uL — ABNORMAL LOW (ref 1.0–3.6)
MCH: 26.3 pg (ref 26.0–34.0)
MCHC: 29.7 g/dL — ABNORMAL LOW (ref 32.0–36.0)
MCV: 88.7 fL (ref 80.0–100.0)
Monocytes Absolute: 0.5 10*3/uL (ref 0.2–1.0)
Monocytes Relative: 3 %
NEUTROS ABS: 15.7 10*3/uL — AB (ref 1.4–6.5)
Platelets: 100 10*3/uL — ABNORMAL LOW (ref 150–440)
RBC: 2.66 MIL/uL — AB (ref 4.40–5.90)
RDW: 18.9 % — ABNORMAL HIGH (ref 11.5–14.5)
WBC: 17.2 10*3/uL — ABNORMAL HIGH (ref 3.8–10.6)

## 2016-04-16 LAB — TSH: TSH: 20.042 u[IU]/mL — AB (ref 0.350–4.500)

## 2016-04-16 LAB — SEDIMENTATION RATE: Sed Rate: 128 mm/hr — ABNORMAL HIGH (ref 0–15)

## 2016-04-17 ENCOUNTER — Other Ambulatory Visit
Admission: RE | Admit: 2016-04-17 | Discharge: 2016-04-17 | Disposition: A | Discharge status: Home / Self Care | Payer: Medicare Other | Source: Ambulatory Visit | Attending: Family Medicine | Admitting: Family Medicine

## 2016-04-17 DIAGNOSIS — I10 Essential (primary) hypertension: Secondary | ICD-10-CM | POA: Diagnosis present

## 2016-04-17 LAB — BASIC METABOLIC PANEL
ANION GAP: 15 (ref 5–15)
BUN: 19 mg/dL (ref 6–20)
CALCIUM: 7.9 mg/dL — AB (ref 8.9–10.3)
CHLORIDE: 96 mmol/L — AB (ref 101–111)
CO2: 24 mmol/L (ref 22–32)
Creatinine, Ser: 2.16 mg/dL — ABNORMAL HIGH (ref 0.61–1.24)
GFR calc non Af Amer: 35 mL/min — ABNORMAL LOW (ref >60)
GFR, EST AFRICAN AMERICAN: 40 mL/min — AB (ref >60)
Glucose, Bld: 251 mg/dL — ABNORMAL HIGH (ref 65–99)
POTASSIUM: 3.9 mmol/L (ref 3.5–5.1)
Sodium: 135 mmol/L (ref 135–145)

## 2016-04-22 ENCOUNTER — Telehealth: Payer: Self-pay

## 2016-04-22 NOTE — Telephone Encounter (Signed)
Called patient to confirm office appointment. Patient's son informed me that patient passed away yesterday at [facility]. Chart has been updated to reflect deceased status.  Explained to patient's son that he can notify our office if there is anything further that we can do. Apologies given.

## 2016-04-23 ENCOUNTER — Ambulatory Visit: Payer: Medicare Other | Admitting: General Surgery

## 2016-05-07 IMAGING — CR DG ABDOMEN 1V
2 series · 2 of 2 positions shown · non-contrast
Comparison: None.

CLINICAL DATA: Nausea and vomiting.  Initial encounter.

EXAM:
ABDOMEN - 1 VIEW

[AP (1 of 2)]
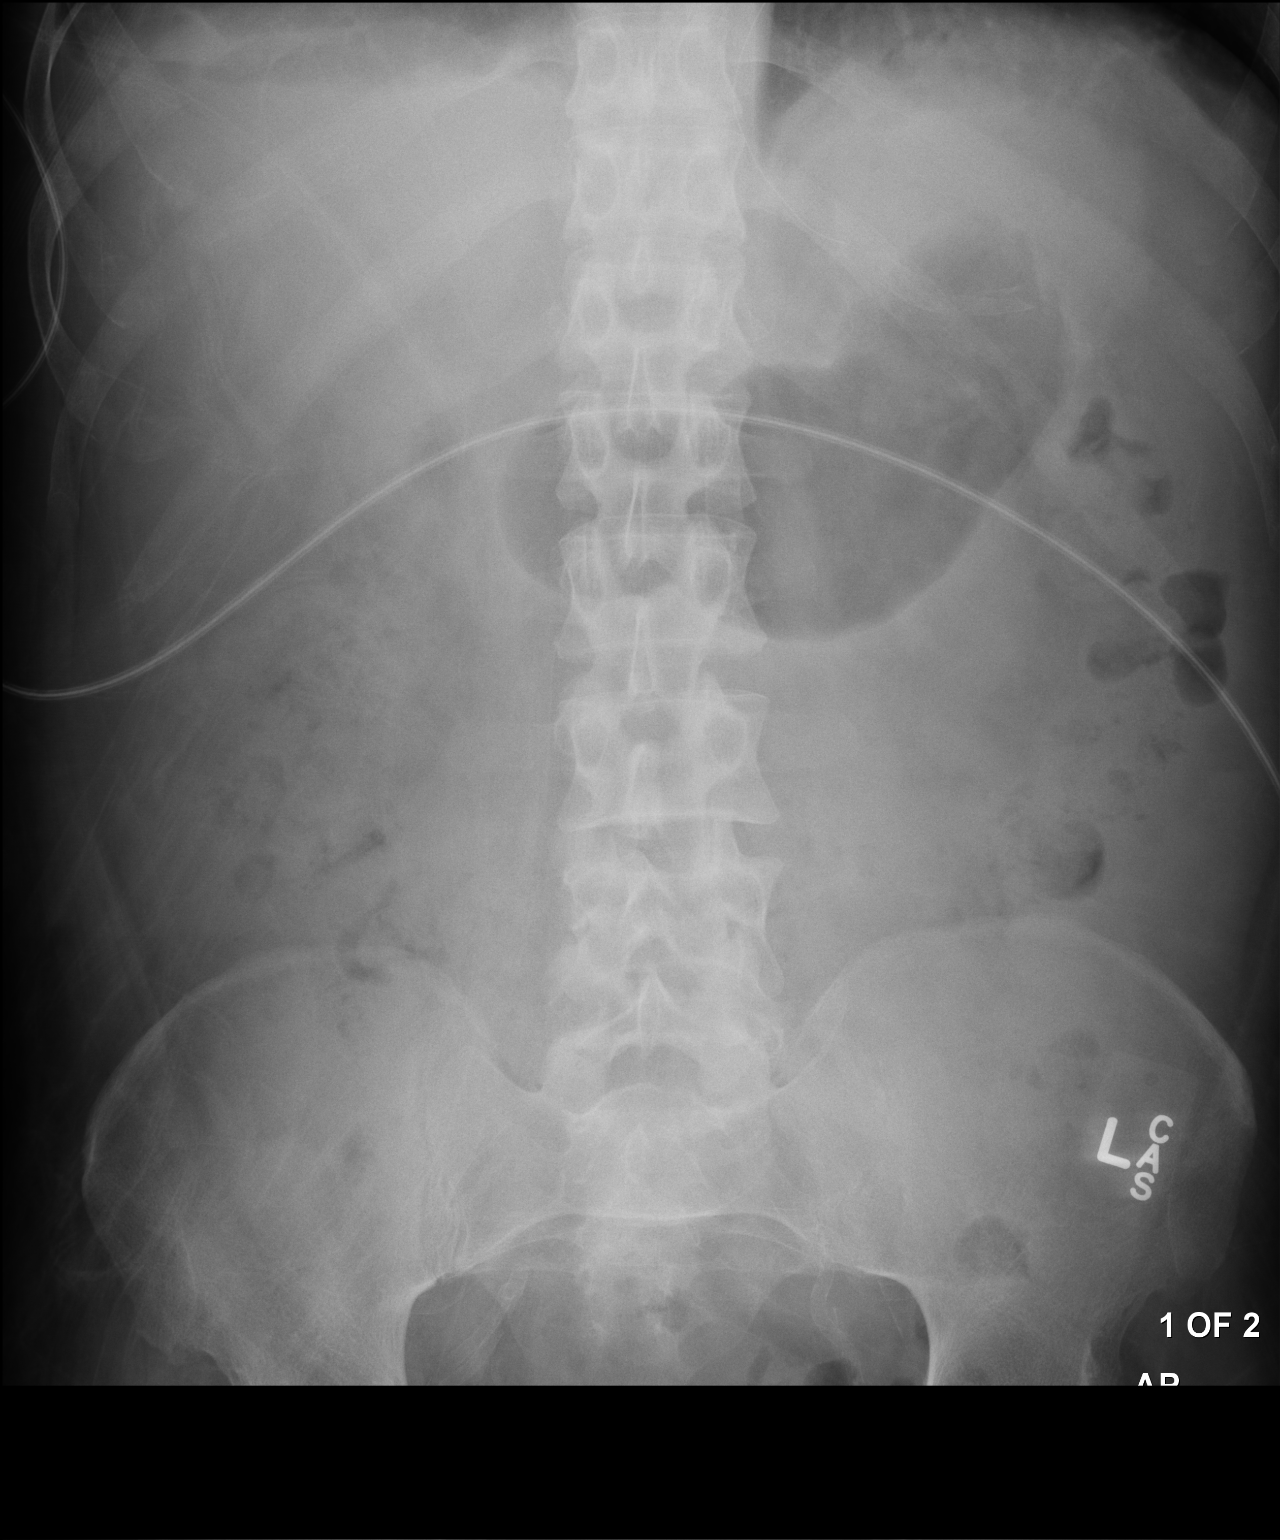

[AP (2 of 2)]
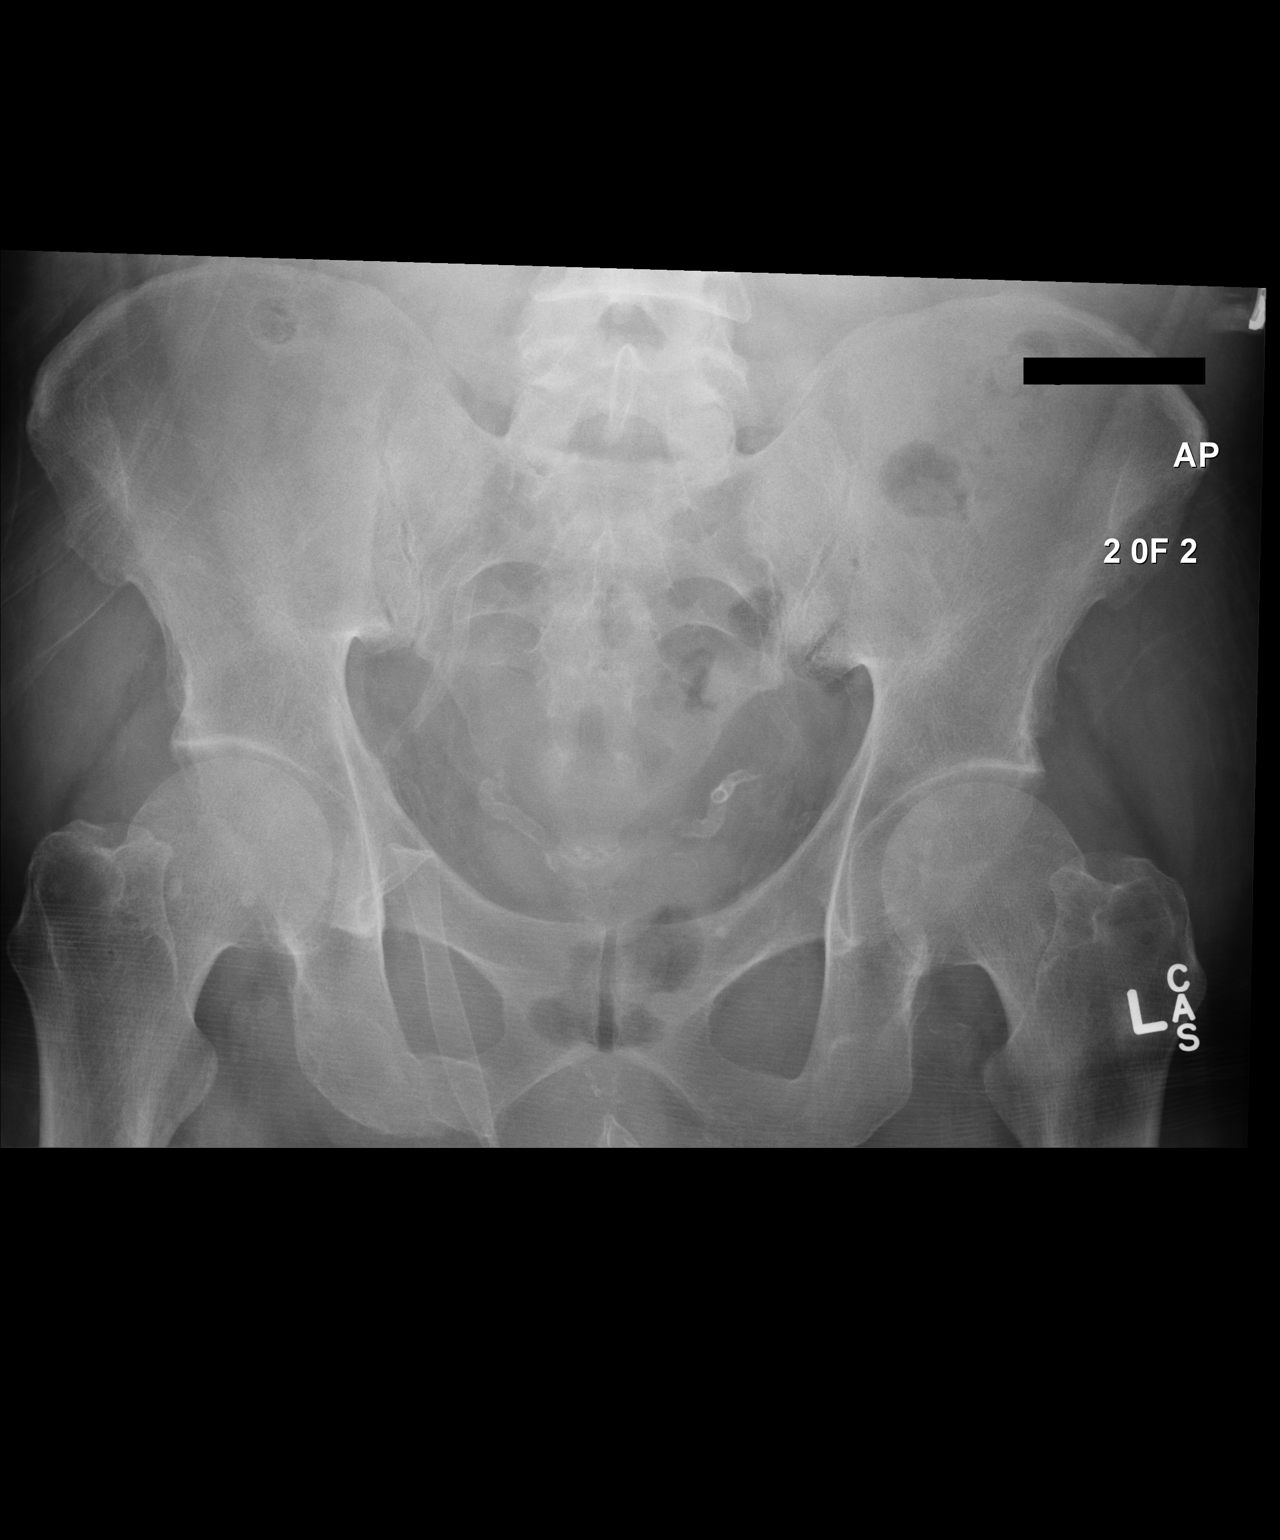

[2 of 2 positions shown; findings below may reference images not displayed]

FINDINGS: There is no evidence of bowel obstruction. No concern
intra-abdominal mass effect or calcification. A surgical drain
overlaps the right inguinal region. Diffuse arterial calcification.
No acute osseous findings.
IMPRESSION: No evidence for bowel obstruction or ileus.

## 2016-05-09 DEATH — deceased

## 2016-06-02 LAB — SURGICAL PATHOLOGY

## 2016-11-04 IMAGING — CT CT CHEST-ABD-PELV W/ CM
1 of 4 series · 13 of 32 positions shown, 18 images · IV contrast (omnipaque)
Comparison: None.

CLINICAL DATA: Sepsis and open draining wound of left scrotum and
peroneal region.

EXAM:
CT CHEST, ABDOMEN, AND PELVIS WITH CONTRAST
TECHNIQUE: Multidetector CT imaging of the chest, abdomen and pelvis was
performed following the standard protocol during bolus
administration of intravenous contrast.
CONTRAST:  100 mL Omnipaque 300 IV

[Series 2: cap with · axial · 0.76mm/px · z∈[-1116,-492]mm · 13 of 143 slices shown, 18 images]
[im 9/143  soft-tissue]
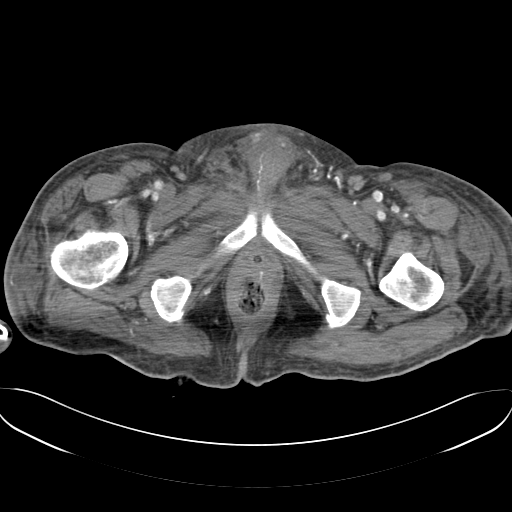
[im 9/143  bone]
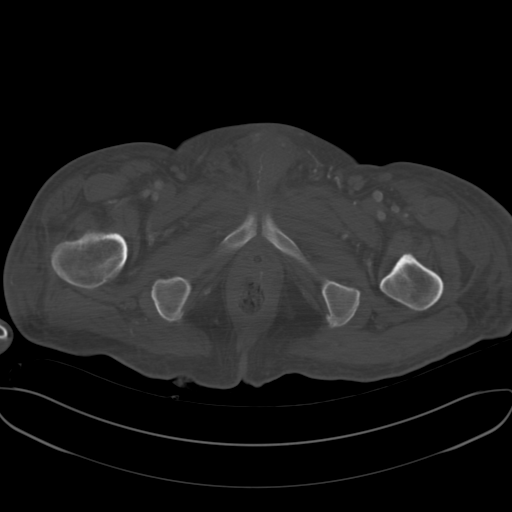
[im 26/143  soft-tissue]
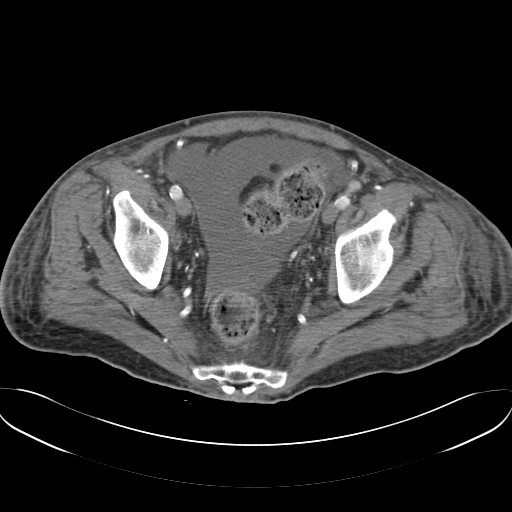
[im 34/143  soft-tissue]
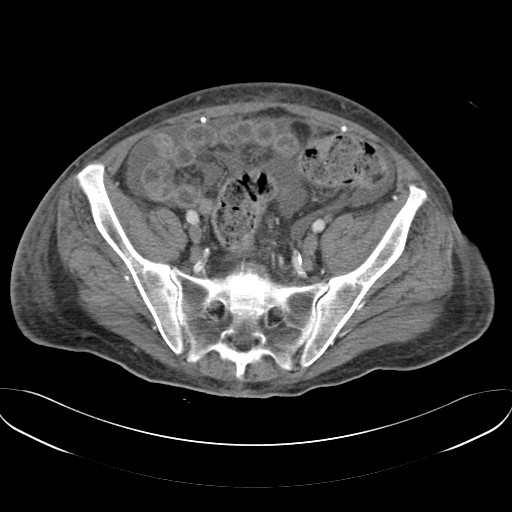
[im 42/143  soft-tissue]
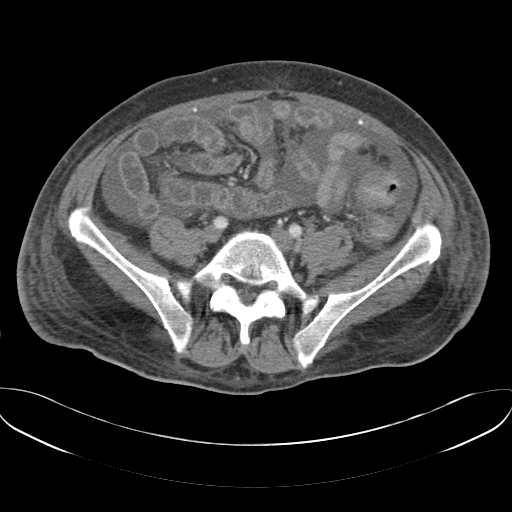
[im 59/143  soft-tissue]
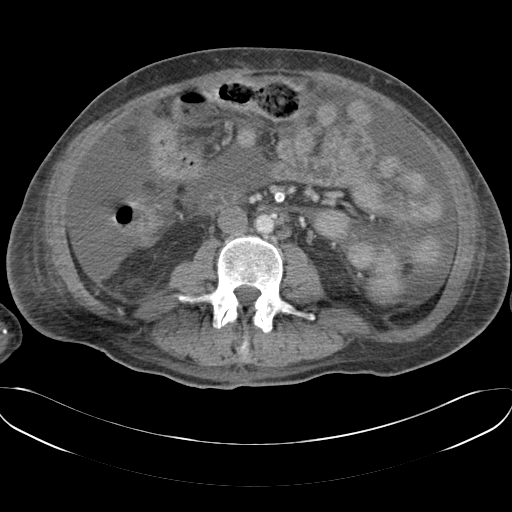
[im 67/143  soft-tissue]
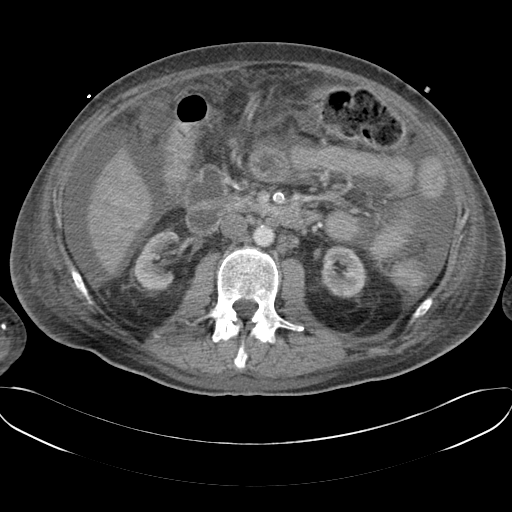
[im 76/143  soft-tissue]
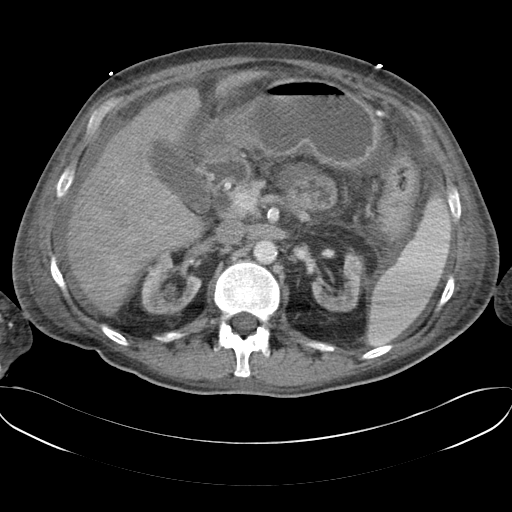
[im 92/143  soft-tissue]
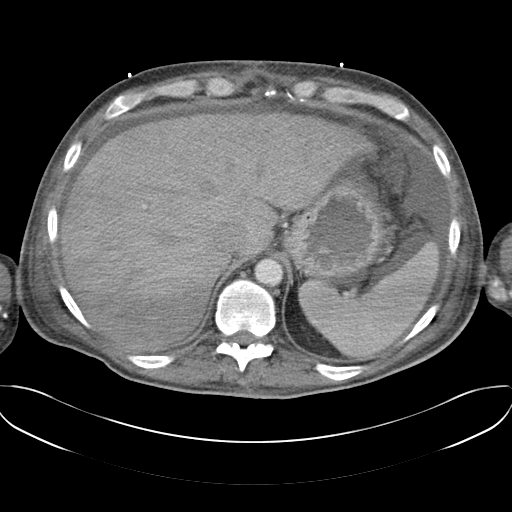
[im 101/143  soft-tissue]
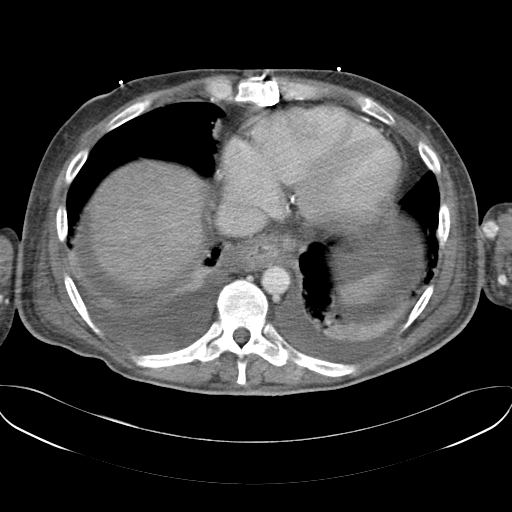
[im 101/143  bone]
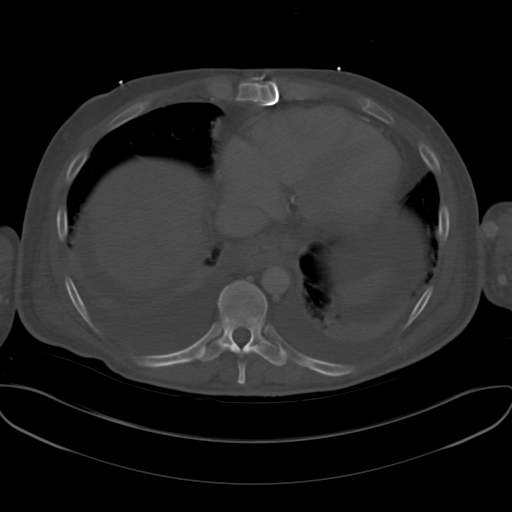
[im 109/143  soft-tissue]
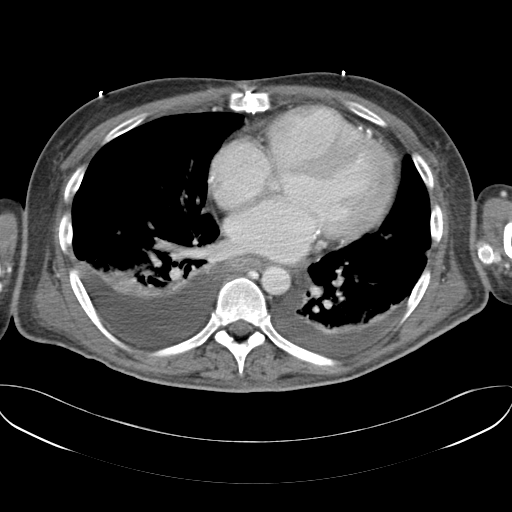
[im 109/143  lung]
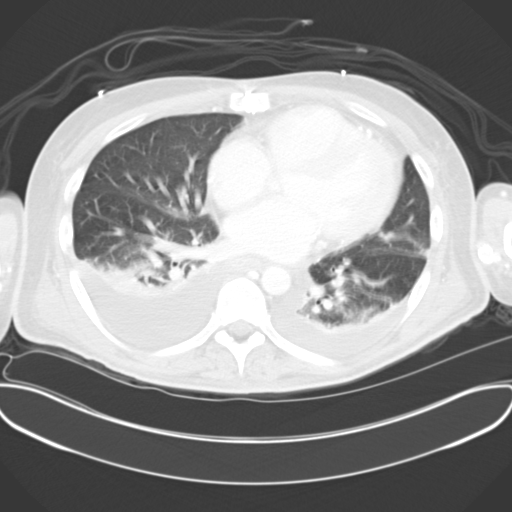
[im 117/143  lung]
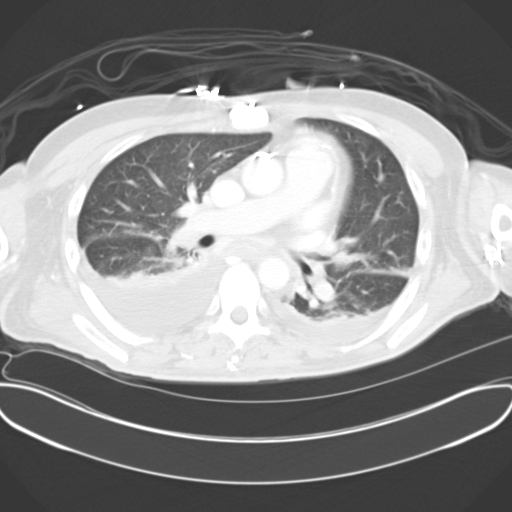
[im 126/143  soft-tissue]
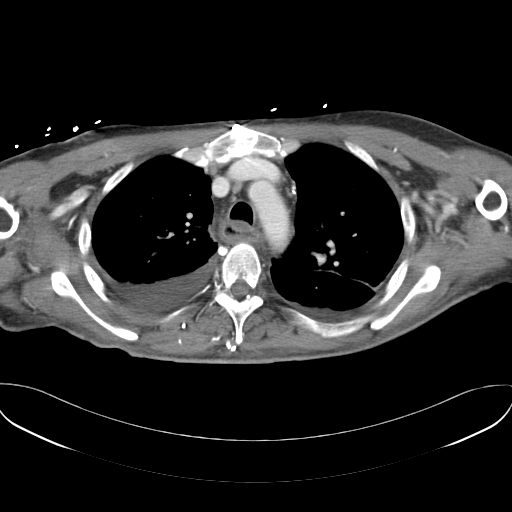
[im 126/143  lung]
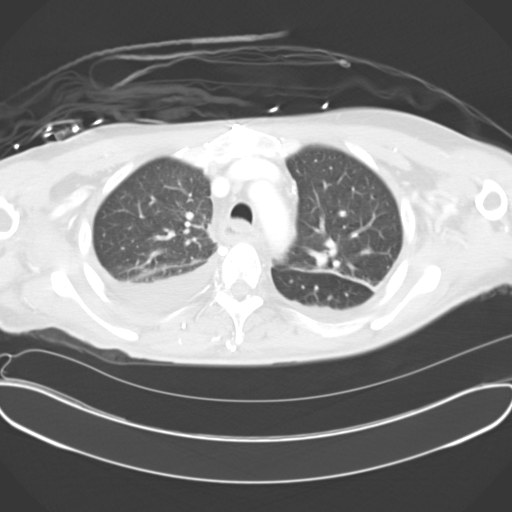
[im 134/143  soft-tissue]
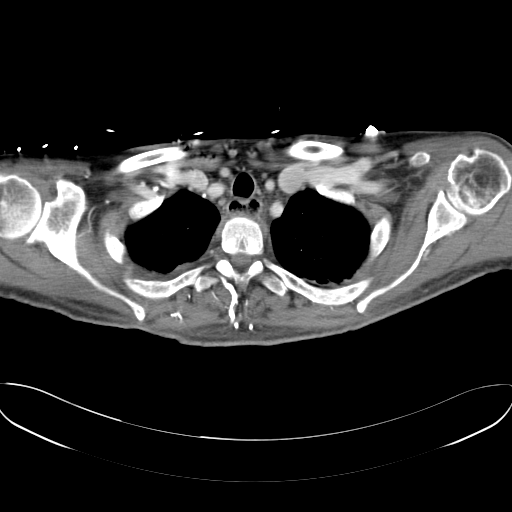
[im 134/143  lung]
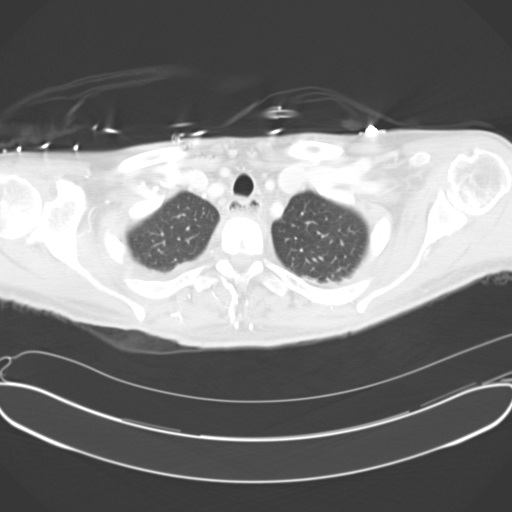

[13 of 32 positions shown; findings below may reference images not displayed]

FINDINGS: CT CHEST FINDINGS

Small bilateral pleural effusions are present, right greater than
left. Compressive atelectasis of both lower lobes present related to
the pleural effusions. No overt pulmonary consolidation or edema. No
enlarged lymph nodes are seen.

The heart size is at the upper limits of normal. The patient has had
prior CABG. The thoracic aorta is of normal caliber. No airway
obstruction.

CT ABDOMEN AND PELVIS FINDINGS

Liver has a heterogeneous appearance with some degree of parenchymal
disease suspected. The liver is not overtly cirrhotic in appearance.
However, there is a scattered and overall moderate amount of ascites
present throughout the peritoneal cavity. The spleen is nonenlarged
and no visualized varices in the abdomen or pelvis.

The gallbladder is unremarkable. The pancreas is atrophic. Both
kidneys show atrophic appearance without hydronephrosis or mass.
There are some scattered small lymph nodes in the retroperitoneum
which are nonspecific. The abdominal aorta and branch vessels show
atherosclerotic calcifications without aneurysm or significant
stenosis.

The bladder is decompressed by a Foley catheter. No focal abscess is
identified in the abdomen or pelvis. There appears to be potentially
a focal scrotal abscess containing fluid and air and also
potentially extending into the base of the penis. This lies just
anterior to the urethra and measures roughly 1.5 x 3.0 cm by CT.
Bony structures are unremarkable.
IMPRESSION: 1. Bilateral pleural effusions, right greater than left.
2. Moderate ascites scattered throughout the peritoneal cavity. The
liver has a heterogeneous appearance and some degree of hepatic
parenchymal disease is suspected. Correlation suggested with liver
function tests.
3. Focal scrotal abscess extending into the base of the penis.
Transverse dimensions are roughly 1.5 x 3 cm.
